# Patient Record
Sex: Male | Born: 1945 | Race: Black or African American | Hispanic: No | Smoking: Former smoker
Health system: Southern US, Community
[De-identification: ages and names within clinical notes are randomized; demographics above are authoritative.]

## PROBLEM LIST (undated history)

## (undated) DIAGNOSIS — E119 Type 2 diabetes mellitus without complications: Secondary | ICD-10-CM

## (undated) DIAGNOSIS — I10 Essential (primary) hypertension: Secondary | ICD-10-CM

## (undated) DIAGNOSIS — F329 Major depressive disorder, single episode, unspecified: Secondary | ICD-10-CM

## (undated) DIAGNOSIS — M25551 Pain in right hip: Secondary | ICD-10-CM

## (undated) DIAGNOSIS — M545 Low back pain, unspecified: Secondary | ICD-10-CM

## (undated) DIAGNOSIS — G8929 Other chronic pain: Secondary | ICD-10-CM

## (undated) DIAGNOSIS — E785 Hyperlipidemia, unspecified: Secondary | ICD-10-CM

## (undated) DIAGNOSIS — N200 Calculus of kidney: Secondary | ICD-10-CM

## (undated) DIAGNOSIS — G43909 Migraine, unspecified, not intractable, without status migrainosus: Secondary | ICD-10-CM

## (undated) DIAGNOSIS — F32A Depression, unspecified: Secondary | ICD-10-CM

## (undated) DIAGNOSIS — G8918 Other acute postprocedural pain: Secondary | ICD-10-CM

## (undated) DIAGNOSIS — F419 Anxiety disorder, unspecified: Secondary | ICD-10-CM

## (undated) HISTORY — DX: Essential (primary) hypertension: I10

## (undated) HISTORY — DX: Hyperlipidemia, unspecified: E78.5

## (undated) HISTORY — PX: TOTAL HIP ARTHROPLASTY: SHX124

## (undated) HISTORY — DX: Major depressive disorder, single episode, unspecified: F32.9

## (undated) HISTORY — DX: Type 2 diabetes mellitus without complications: E11.9

## (undated) HISTORY — DX: Pain in right hip: M25.551

## (undated) HISTORY — PX: OTHER SURGICAL HISTORY: SHX169

## (undated) HISTORY — DX: Low back pain: M54.5

## (undated) HISTORY — DX: Other chronic pain: G89.29

## (undated) HISTORY — DX: Calculus of kidney: N20.0

## (undated) HISTORY — DX: Other acute postprocedural pain: G89.18

## (undated) HISTORY — PX: TONSILLECTOMY: SUR1361

## (undated) HISTORY — DX: Depression, unspecified: F32.A

## (undated) HISTORY — DX: Anxiety disorder, unspecified: F41.9

## (undated) HISTORY — DX: Low back pain, unspecified: M54.50

## (undated) HISTORY — DX: Migraine, unspecified, not intractable, without status migrainosus: G43.909

---

## 2013-02-21 ENCOUNTER — Encounter: Payer: Self-pay | Admitting: Family

## 2013-03-05 ENCOUNTER — Encounter: Payer: Self-pay | Admitting: Family

## 2013-04-26 ENCOUNTER — Ambulatory Visit: Payer: Self-pay | Admitting: Pain Medicine

## 2013-10-13 ENCOUNTER — Ambulatory Visit: Payer: Self-pay | Admitting: Pain Medicine

## 2013-10-13 LAB — HEPATIC FUNCTION PANEL A (ARMC)
ALK PHOS: 78 U/L
ALT: 71 U/L — AB
Albumin: 3.5 g/dL (ref 3.4–5.0)
Bilirubin, Direct: 0.1 mg/dL (ref 0.00–0.20)
Bilirubin,Total: 0.2 mg/dL (ref 0.2–1.0)
SGOT(AST): 43 U/L — ABNORMAL HIGH (ref 15–37)
Total Protein: 7.3 g/dL (ref 6.4–8.2)

## 2013-10-13 LAB — BASIC METABOLIC PANEL
ANION GAP: 11 (ref 7–16)
BUN: 17 mg/dL (ref 7–18)
Calcium, Total: 9 mg/dL (ref 8.5–10.1)
Chloride: 102 mmol/L (ref 98–107)
Co2: 26 mmol/L (ref 21–32)
Creatinine: 1.07 mg/dL (ref 0.60–1.30)
EGFR (Non-African Amer.): >60
Glucose: 129 mg/dL — ABNORMAL HIGH (ref 65–99)
Osmolality: 281 (ref 275–301)
POTASSIUM: 4.1 mmol/L (ref 3.5–5.1)
Sodium: 139 mmol/L (ref 136–145)

## 2013-10-13 LAB — SEDIMENTATION RATE: Erythrocyte Sed Rate: 22 mm/hr — ABNORMAL HIGH (ref 0–20)

## 2013-10-13 LAB — MAGNESIUM: Magnesium: 1.7 mg/dL — ABNORMAL LOW

## 2013-10-23 ENCOUNTER — Ambulatory Visit: Payer: Self-pay | Admitting: Pain Medicine

## 2013-12-12 ENCOUNTER — Ambulatory Visit: Payer: Self-pay | Admitting: Pain Medicine

## 2013-12-19 ENCOUNTER — Ambulatory Visit: Payer: Self-pay | Admitting: Pain Medicine

## 2014-05-02 DIAGNOSIS — G629 Polyneuropathy, unspecified: Secondary | ICD-10-CM | POA: Insufficient documentation

## 2014-05-02 DIAGNOSIS — G8220 Paraplegia, unspecified: Secondary | ICD-10-CM | POA: Insufficient documentation

## 2014-11-28 ENCOUNTER — Encounter: Payer: Self-pay | Admitting: Pain Medicine

## 2014-12-05 ENCOUNTER — Other Ambulatory Visit: Payer: Self-pay | Admitting: Pain Medicine

## 2014-12-05 ENCOUNTER — Encounter: Payer: Self-pay | Admitting: Pain Medicine

## 2014-12-05 ENCOUNTER — Ambulatory Visit: Payer: Medicare Other | Attending: Pain Medicine | Admitting: Pain Medicine

## 2014-12-05 VITALS — BP 106/85 | HR 88 | Temp 98.3°F | Resp 18 | Ht 73.0 in | Wt 245.0 lb

## 2014-12-05 DIAGNOSIS — K219 Gastro-esophageal reflux disease without esophagitis: Secondary | ICD-10-CM | POA: Diagnosis not present

## 2014-12-05 DIAGNOSIS — M792 Neuralgia and neuritis, unspecified: Secondary | ICD-10-CM

## 2014-12-05 DIAGNOSIS — M5442 Lumbago with sciatica, left side: Secondary | ICD-10-CM

## 2014-12-05 DIAGNOSIS — G8929 Other chronic pain: Secondary | ICD-10-CM

## 2014-12-05 DIAGNOSIS — M79604 Pain in right leg: Secondary | ICD-10-CM

## 2014-12-05 DIAGNOSIS — E785 Hyperlipidemia, unspecified: Secondary | ICD-10-CM

## 2014-12-05 DIAGNOSIS — N181 Chronic kidney disease, stage 1: Secondary | ICD-10-CM

## 2014-12-05 DIAGNOSIS — E1142 Type 2 diabetes mellitus with diabetic polyneuropathy: Secondary | ICD-10-CM | POA: Diagnosis not present

## 2014-12-05 DIAGNOSIS — M47896 Other spondylosis, lumbar region: Secondary | ICD-10-CM

## 2014-12-05 DIAGNOSIS — E1040 Type 1 diabetes mellitus with diabetic neuropathy, unspecified: Secondary | ICD-10-CM | POA: Diagnosis not present

## 2014-12-05 DIAGNOSIS — N189 Chronic kidney disease, unspecified: Secondary | ICD-10-CM | POA: Insufficient documentation

## 2014-12-05 DIAGNOSIS — M545 Low back pain, unspecified: Secondary | ICD-10-CM

## 2014-12-05 DIAGNOSIS — Z96641 Presence of right artificial hip joint: Secondary | ICD-10-CM | POA: Diagnosis not present

## 2014-12-05 DIAGNOSIS — M25559 Pain in unspecified hip: Secondary | ICD-10-CM | POA: Diagnosis present

## 2014-12-05 DIAGNOSIS — M5441 Lumbago with sciatica, right side: Secondary | ICD-10-CM

## 2014-12-05 DIAGNOSIS — G4733 Obstructive sleep apnea (adult) (pediatric): Secondary | ICD-10-CM | POA: Diagnosis not present

## 2014-12-05 DIAGNOSIS — F32A Depression, unspecified: Secondary | ICD-10-CM

## 2014-12-05 DIAGNOSIS — M5416 Radiculopathy, lumbar region: Secondary | ICD-10-CM

## 2014-12-05 DIAGNOSIS — Z91041 Radiographic dye allergy status: Secondary | ICD-10-CM

## 2014-12-05 DIAGNOSIS — M79605 Pain in left leg: Secondary | ICD-10-CM

## 2014-12-05 DIAGNOSIS — Z5181 Encounter for therapeutic drug level monitoring: Secondary | ICD-10-CM | POA: Insufficient documentation

## 2014-12-05 DIAGNOSIS — M25551 Pain in right hip: Secondary | ICD-10-CM | POA: Diagnosis not present

## 2014-12-05 DIAGNOSIS — I10 Essential (primary) hypertension: Secondary | ICD-10-CM

## 2014-12-05 DIAGNOSIS — Z79899 Other long term (current) drug therapy: Secondary | ICD-10-CM

## 2014-12-05 DIAGNOSIS — I129 Hypertensive chronic kidney disease with stage 1 through stage 4 chronic kidney disease, or unspecified chronic kidney disease: Secondary | ICD-10-CM | POA: Insufficient documentation

## 2014-12-05 DIAGNOSIS — F1120 Opioid dependence, uncomplicated: Secondary | ICD-10-CM

## 2014-12-05 DIAGNOSIS — Z79891 Long term (current) use of opiate analgesic: Secondary | ICD-10-CM

## 2014-12-05 DIAGNOSIS — F329 Major depressive disorder, single episode, unspecified: Secondary | ICD-10-CM | POA: Diagnosis not present

## 2014-12-05 DIAGNOSIS — R94130 Abnormal response to nerve stimulation, unspecified: Secondary | ICD-10-CM

## 2014-12-05 DIAGNOSIS — F411 Generalized anxiety disorder: Secondary | ICD-10-CM | POA: Diagnosis not present

## 2014-12-05 DIAGNOSIS — Z794 Long term (current) use of insulin: Secondary | ICD-10-CM

## 2014-12-05 DIAGNOSIS — M542 Cervicalgia: Secondary | ICD-10-CM

## 2014-12-05 DIAGNOSIS — F39 Unspecified mood [affective] disorder: Secondary | ICD-10-CM | POA: Insufficient documentation

## 2014-12-05 DIAGNOSIS — M47816 Spondylosis without myelopathy or radiculopathy, lumbar region: Secondary | ICD-10-CM | POA: Diagnosis not present

## 2014-12-05 DIAGNOSIS — E1159 Type 2 diabetes mellitus with other circulatory complications: Secondary | ICD-10-CM | POA: Insufficient documentation

## 2014-12-05 DIAGNOSIS — F1190 Opioid use, unspecified, uncomplicated: Secondary | ICD-10-CM | POA: Insufficient documentation

## 2014-12-05 DIAGNOSIS — E1169 Type 2 diabetes mellitus with other specified complication: Secondary | ICD-10-CM | POA: Insufficient documentation

## 2014-12-05 DIAGNOSIS — E119 Type 2 diabetes mellitus without complications: Secondary | ICD-10-CM

## 2014-12-05 DIAGNOSIS — IMO0001 Reserved for inherently not codable concepts without codable children: Secondary | ICD-10-CM | POA: Insufficient documentation

## 2014-12-05 DIAGNOSIS — E782 Mixed hyperlipidemia: Secondary | ICD-10-CM | POA: Insufficient documentation

## 2014-12-05 DIAGNOSIS — M79606 Pain in leg, unspecified: Secondary | ICD-10-CM

## 2014-12-05 HISTORY — DX: Other chronic pain: G89.29

## 2014-12-05 MED ORDER — OXYCODONE HCL 10 MG PO TABS: 10.0000 mg | ORAL_TABLET | Freq: Four times a day (QID) | ORAL | Status: DC | PRN

## 2014-12-05 MED ORDER — PREGABALIN 75 MG PO CAPS: 75.0000 mg | ORAL_CAPSULE | Freq: Two times a day (BID) | ORAL | Status: DC

## 2014-12-05 NOTE — Patient Instructions (Addendum)
GENERAL RISKS AND COMPLICATIONS  What are the risk, side effects and possible complications? Generally speaking, most procedures are safe.  However, with any procedure there are risks, side effects, and the possibility of complications.  The risks and complications are dependent upon the sites that are lesioned, or the type of nerve block to be performed.  The closer the procedure is to the spine, the more serious the risks are.  Great care is taken when placing the radio frequency needles, block needles or lesioning probes, but sometimes complications can occur.  Infection: Any time there is an injection through the skin, there is a risk of infection.  This is why sterile conditions are used for these blocks.  There are four possible types of infection.  Localized skin infection.  Central Nervous System Infection-This can be in the form of Meningitis, which can be deadly.  Epidural Infections-This can be in the form of an epidural abscess, which can cause pressure inside of the spine, causing compression of the spinal cord with subsequent paralysis. This would require an emergency surgery to decompress, and there are no guarantees that the patient would recover from the paralysis.  Discitis-This is an infection of the intervertebral discs.  It occurs in about 1% of discography procedures.  It is difficult to treat and it may lead to surgery.        2. Pain: the needles have to go through skin and soft tissues, will cause soreness.       3. Damage to internal structures:  The nerves to be lesioned may be near blood vessels or    other nerves which can be potentially damaged.       4. Bleeding: Bleeding is more common if the patient is taking blood thinners such as  aspirin, Coumadin, Ticiid, Plavix, etc., or if he/she have some genetic predisposition  such as hemophilia. Bleeding into the spinal canal can cause compression of the spinal  cord with subsequent paralysis.  This would require an  emergency surgery to  decompress and there are no guarantees that the patient would recover from the  paralysis.       5. Pneumothorax:  Puncturing of a lung is a possibility, every time a needle is introduced in  the area of the chest or upper back.  Pneumothorax refers to free air around the  collapsed lung(s), inside of the thoracic cavity (chest cavity).  Another two possible  complications related to a similar event would include: Hemothorax and Chylothorax.   These are variations of the Pneumothorax, where instead of air around the collapsed  lung(s), you may have blood or chyle, respectively.       6. Spinal headaches: They may occur with any procedures in the area of the spine.       7. Persistent CSF (Cerebro-Spinal Fluid) leakage: This is a rare problem, but may occur  with prolonged intrathecal or epidural catheters either due to the formation of a fistulous  track or a dural tear.       8. Nerve damage: By working so close to the spinal cord, there is always a possibility of  nerve damage, which could be as serious as a permanent spinal cord injury with  paralysis.       9. Death:  Although rare, severe deadly allergic reactions known as "Anaphylactic  reaction" can occur to any of the medications used.      10. Worsening of the symptoms:  We can always make thing worse.  What are the chances of something like this happening? Chances of any of this occuring are extremely low.  By statistics, you have more of a chance of getting killed in a motor vehicle accident: while driving to the hospital than any of the above occurring .  Nevertheless, you should be aware that they are possibilities.  In general, it is similar to taking a shower.  Everybody knows that you can slip, hit your head and get killed.  Does that mean that you should not shower again?  Nevertheless always keep in mind that statistics do not mean anything if you happen to be on the wrong side of them.  Even if a procedure has a 1  (one) in a 1,000,000 (million) chance of going wrong, it you happen to be that one..Also, keep in mind that by statistics, you have more of a chance of having something go wrong when taking medications.  Who should not have this procedure? If you are on a blood thinning medication (e.g. Coumadin, Plavix, see list of "Blood Thinners"), or if you have an active infection going on, you should not have the procedure.  If you are taking any blood thinners, please inform your physician.  How should I prepare for this procedure?  Do not eat or drink anything at least six hours prior to the procedure.  Bring a driver with you .  It cannot be a taxi.  Come accompanied by an adult that can drive you back, and that is strong enough to help you if your legs get weak or numb from the local anesthetic.  Take all of your medicines the morning of the procedure with just enough water to swallow them.  If you have diabetes, make sure that you are scheduled to have your procedure done first thing in the morning, whenever possible.  If you have diabetes, take only half of your insulin dose and notify our nurse that you have done so as soon as you arrive at the clinic.  If you are diabetic, but only take blood sugar pills (oral hypoglycemic), then do not take them on the morning of your procedure.  You may take them after you have had the procedure.  Do not take aspirin or any aspirin-containing medications, at least eleven (11) days prior to the procedure.  They may prolong bleeding.  Wear loose fitting clothing that may be easy to take off and that you would not mind if it got stained with Betadine or blood.  Do not wear any jewelry or perfume  Remove any nail coloring.  It will interfere with some of our monitoring equipment.  NOTE: Remember that this is not meant to be interpreted as a complete list of all possible complications.  Unforeseen problems may occur.  BLOOD THINNERS The following drugs  contain aspirin or other products, which can cause increased bleeding during surgery and should not be taken for 2 weeks prior to and 1 week after surgery.  If you should need take something for relief of minor pain, you may take acetaminophen which is found in Tylenol,m Datril, Anacin-3 and Panadol. It is not blood thinner. The products listed below are.  Do not take any of the products listed below in addition to any listed on your instruction sheet.  A.P.C or A.P.C with Codeine Codeine Phosphate Capsules #3 Ibuprofen Ridaura  ABC compound Congesprin Imuran rimadil  Advil Cope Indocin Robaxisal  Alka-Seltzer Effervescent Pain Reliever and Antacid Coricidin or Coricidin-D  Indomethacin Rufen    Alka-Seltzer plus Cold Medicine Cosprin Ketoprofen S-A-C Tablets  Anacin Analgesic Tablets or Capsules Coumadin Korlgesic Salflex  Anacin Extra Strength Analgesic tablets or capsules CP-2 Tablets Lanoril Salicylate  Anaprox Cuprimine Capsules Levenox Salocol  Anexsia-D Dalteparin Magan Salsalate  Anodynos Darvon compound Magnesium Salicylate Sine-off  Ansaid Dasin Capsules Magsal Sodium Salicylate  Anturane Depen Capsules Marnal Soma  APF Arthritis pain formula Dewitt's Pills Measurin Stanback  Argesic Dia-Gesic Meclofenamic Sulfinpyrazone  Arthritis Bayer Timed Release Aspirin Diclofenac Meclomen Sulindac  Arthritis pain formula Anacin Dicumarol Medipren Supac  Analgesic (Safety coated) Arthralgen Diffunasal Mefanamic Suprofen  Arthritis Strength Bufferin Dihydrocodeine Mepro Compound Suprol  Arthropan liquid Dopirydamole Methcarbomol with Aspirin Synalgos  ASA tablets/Enseals Disalcid Micrainin Tagament  Ascriptin Doan's Midol Talwin  Ascriptin A/D Dolene Mobidin Tanderil  Ascriptin Extra Strength Dolobid Moblgesic Ticlid  Ascriptin with Codeine Doloprin or Doloprin with Codeine Momentum Tolectin  Asperbuf Duoprin Mono-gesic Trendar  Aspergum Duradyne Motrin or Motrin IB Triminicin  Aspirin  plain, buffered or enteric coated Durasal Myochrisine Trigesic  Aspirin Suppositories Easprin Nalfon Trillsate  Aspirin with Codeine Ecotrin Regular or Extra Strength Naprosyn Uracel  Atromid-S Efficin Naproxen Ursinus  Auranofin Capsules Elmiron Neocylate Vanquish  Axotal Emagrin Norgesic Verin  Azathioprine Empirin or Empirin with Codeine Normiflo Vitamin E  Azolid Emprazil Nuprin Voltaren  Bayer Aspirin plain, buffered or children's or timed BC Tablets or powders Encaprin Orgaran Warfarin Sodium  Buff-a-Comp Enoxaparin Orudis Zorpin  Buff-a-Comp with Codeine Equegesic Os-Cal-Gesic   Buffaprin Excedrin plain, buffered or Extra Strength Oxalid   Bufferin Arthritis Strength Feldene Oxphenbutazone   Bufferin plain or Extra Strength Feldene Capsules Oxycodone with Aspirin   Bufferin with Codeine Fenoprofen Fenoprofen Pabalate or Pabalate-SF   Buffets II Flogesic Panagesic   Buffinol plain or Extra Strength Florinal or Florinal with Codeine Panwarfarin   Buf-Tabs Flurbiprofen Penicillamine   Butalbital Compound Four-way cold tablets Penicillin   Butazolidin Fragmin Pepto-Bismol   Carbenicillin Geminisyn Percodan   Carna Arthritis Reliever Geopen Persantine   Carprofen Gold's salt Persistin   Chloramphenicol Goody's Phenylbutazone   Chloromycetin Haltrain Piroxlcam   Clmetidine heparin Plaquenil   Cllnoril Hyco-pap Ponstel   Clofibrate Hydroxy chloroquine Propoxyphen         Before stopping any of these medications, be sure to consult the physician who ordered them.  Some, such as Coumadin (Warfarin) are ordered to prevent or treat serious conditions such as "deep thrombosis", "pumonary embolisms", and other heart problems.  The amount of time that you may need off of the medication may also vary with the medication and the reason for which you were taking it.  If you are taking any of these medications, please make sure you notify your pain physician before you undergo any  procedures.         Facet Blocks Patient Information  Description: The facets are joints in the spine between the vertebrae.  Like any joints in the body, facets can become irritated and painful.  Arthritis can also effect the facets.  By injecting steroids and local anesthetic in and around these joints, we can temporarily block the nerve supply to them.  Steroids act directly on irritated nerves and tissues to reduce selling and inflammation which often leads to decreased pain.  Facet blocks may be done anywhere along the spine from the neck to the low back depending upon the location of your pain.   After numbing the skin with local anesthetic (like Novocaine), a small needle is passed onto the facet joints under x-ray guidance.    You may experience a sensation of pressure while this is being done.  The entire block usually lasts about 15-25 minutes.   Conditions which may be treated by facet blocks:   Low back/buttock pain  Neck/shoulder pain  Certain types of headaches  Preparation for the injection:   Do not eat any solid food or dairy products within 6 hours of your appointment.  You may drink clear liquid up to 2 hours before appointment.  Clear liquids include water, black coffee, juice or soda.  No milk or cream please.  You may take your regular medication, including pain medications, with a sip of water before your appointment.  Diabetics should hold regular insulin (if taken separately) and take 1/2 normal NPH dose the morning of the procedure.  Carry some sugar containing items with you to your appointment.  A driver must accompany you and be prepared to drive you home after your procedure.  Bring all your current medications with you.  An IV may be inserted and sedation may be given at the discretion of the physician.  A blood pressure cuff, EKG and other monitors will often be applied during the procedure.  Some patients may need to have extra oxygen administered  for a short period.  You will be asked to provide medical information, including your allergies and medications, prior to the procedure.  We must know immediately if you are taking blood thinners (like Coumadin/Warfarin) or if you are allergic to IV iodine contrast (dye).  We must know if you could possible be pregnant.  Possible side-effects:   Bleeding from needle site  Infection (rare, may require surgery)  Nerve injury (rare)  Numbness & tingling (temporary)  Difficulty urinating (rare, temporary)  Spinal headache (a headache worse with upright posture)  Light-headedness (temporary)  Pain at injection site (serveral days)  Decreased blood pressure (rare, temporary)  Weakness in arm/leg (temporary)  Pressure sensation in back/neck (temporary)   Call if you experience:   Fever/chills associated with headache or increased back/neck pain  Headache worsened by an upright position  New onset, weakness or numbness of an extremity below the injection site  Hives or difficulty breathing (go to the emergency room)  Inflammation or drainage at the injection site(s)  Severe back/neck pain greater than usual  New symptoms which are concerning to you  Please note:  Although the local anesthetic injected can often make your back or neck feel good for several hours after the injection, the pain will likely return. It takes 3-7 days for steroids to work.  You may not notice any pain relief for at least one week.  If effective, we will often do a series of 2-3 injections spaced 3-6 weeks apart to maximally decrease your pain.  After the initial series, you may be a candidate for a more permanent nerve block of the facets.  If you have any questions, please call #[phone]) [facility] Clinic[facility] Radiofrequency lesioning is a procedure that is performed to relieve pain. The procedure is often used for back, neck, or arm pain.  Radiofrequency lesioning involves the use of a machine that creates radio waves to make heat. During the procedure, the heat is applied to the nerve that carries the pain signal. The heat damages the nerve and interferes with the pain signal. Pain relief usually lasts for 6 months to 1 year. LET [facility] CARE PROVIDER KNOW ABOUT:  Any allergies you have.  All medicines you are taking, including  vitamins, herbs, eye drops, creams, and over-the-counter medicines.  Previous problems you or members of your family have had with the use of anesthetics.  Any blood disorders you have.  Previous surgeries you have had.  Any medical conditions you have.  Whether you are pregnant or may be pregnant. RISKS AND COMPLICATIONS Generally, this is a safe procedure. However, problems may occur, including:  Pain or soreness at the injection site.  Infection at the injection site.  Damage to nerves or blood vessels. BEFORE THE PROCEDURE  Ask your health care provider about:  Changing or stopping your regular medicines. This is especially important if you are taking diabetes medicines or blood thinners.  Taking medicines such as aspirin and ibuprofen. These medicines can thin your blood. Do not take these medicines before your procedure if your health care provider instructs you not to.  Follow instructions from your health care provider about eating or drinking restrictions.  Plan to have someone take you home after the procedure.  If you go home right after the procedure, plan to have someone with you for 24 hours. PROCEDURE  You will be given one or more of the following:  A medicine to help you relax (sedative).  A medicine to numb the area (local anesthetic).  You will be awake during the procedure. You will need to be able to talk with the health care provider during the procedure.  With the help of a type of X-ray (fluoroscopy), the health care provider will insert a  radiofrequency needle into the area to be treated.  Next, a wire that carries the radio waves (electrode) will be put through the radiofrequency needle. An electrical pulse will be sent through the electrode to verify the correct nerve. You will feel a tingling sensation, and you may have muscle twitching.  Then, the tissue that is around the needle tip will be heated by an electric current that is passed using the radiofrequency machine. This will numb the nerves.  A bandage (dressing) will be put on the insertion area after the procedure is done. The procedure may vary among health care providers and hospitals. AFTER THE PROCEDURE  Your blood pressure, heart rate, breathing rate, and blood oxygen level will be monitored often until the medicines you were given have worn off.  Return to your normal activities as directed by your health care provider.   This information is not intended to replace advice given to you by your health care provider. Make sure you discuss any questions you have with your health care provider.   Document Released: 09/17/2010 Document Revised: 10/10/2014 Document Reviewed: 02/26/2014 Elsevier Interactive Patient Education [employer].

## 2014-12-05 NOTE — Progress Notes (Signed)
Safety precautions to be maintained throughout the outpatient stay will include: orient to surroundings, keep bed in low position, maintain call bell within reach at all times, provide assistance with transfer out of bed and ambulation.  

## 2014-12-05 NOTE — Progress Notes (Signed)
Patient's Name: MARTE CELANI MRN: [id number] DOB: November 05, 1945 DOS: 12/05/2014  Primary Reason(s) for Visit: Encounter for Medication Management. CC: Neck Pain and Hip Pain   HPI:   Mr. Beske is a 69 y.o. year old, male patient, who returns today as an established patient. 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 His primarily concern today is the Neck Pain and Hip Pain    The patient is currently doing well on his medication regimen. I do not plan to change any of the medications at this point. In reviewing his chart, on 12/12/2013 the patient underwent a diagnostic bilateral lumbar facet block under fluoroscopic guidance without sedation. The results indicated 100% relief of the pain for the duration of local anesthetic followed by a 75% improvement in his low back pain and leg pain which has lasted until recently. Based on these results, I believe that he would do well with a lumbar facet radiofrequency ablation. He  wants to proceed with that. Today we reviewed what he has done for his low back pain and he indicates that he completed physical therapy for his lower back and legs, which actually made things worse. This is in fact compatible with lumbar facet syndrome which typically worsens instead of improving with physical therapy. Based on that and the results of the diagnostic lumbar facet block, I will go ahead and schedule him for the lumbar facet radiofrequency. We will do one side at a time. He has elected to do the right side first. We will schedule this as soon as possible. Today's Pain Location: Hip Pain Descriptors / Indicators: Aching, Constant, Radiating, Sore Pain Frequency: Constant  Date of Last Visit: Date of Last Visit: 06/14/14 Service Provided on Last Visit: Service Provided on Last Visit: Med Refill  Pharmacotherapy Review:   Side-effects or Adverse reactions: None reported. Effectiveness: Described as relatively effective, allowing for increase in activities of daily living (ADL). Onset of action: Within expected pharmacological parameters. Duration of action: Within normal limits for medication. Peak effect: Timing and results are as within normal expected parameters. [place] PMP: Compliant with practice rules and regulations. DST: Compliant with practice rules and regulations. Lab work: No new labs ordered by our practice. Treatment compliance: Compliant. Substance Use Disorder (SUD) Risk Level: Low Planned course of action: Continue therapy as is.  Allergies: Mr. Paolo is allergic to contrast media; iodine; and shellfish allergy.  Meds: The patient has a current medication list which includes the following prescription(s): carvedilol, hydrochlorothiazide, insulin aspart protamine- aspart, insulin detemir, liraglutide, metformin, tamsulosin, oxycodone hcl, oxycodone hcl, oxycodone hcl, and pregabalin. Requested Prescriptions   Signed Prescriptions Disp Refills  . Oxycodone HCl  10 MG TABS 120 tablet 0    Sig: Take 1 tablet (10 mg  total) by mouth every 6 (six) hours as needed.  . Oxycodone HCl 10 MG TABS 120 tablet 0    Sig: Take 1 tablet (10 mg total) by mouth every 6 (six) hours as needed.  . Oxycodone HCl 10 MG TABS 120 tablet 0    Sig: Take 1 tablet (10 mg total) by mouth every 6 (six) hours as needed.  . pregabalin (LYRICA) 75 MG capsule 60 capsule 2    Sig: Take 1 capsule (75 mg total) by mouth every 12 (twelve) hours.    ROS: Constitutional: Afebrile, no chills, well hydrated and well nourished Gastrointestinal: negative Musculoskeletal:negative Neurological: negative Behavioral/Psych: negative  PFSH: Medical:  Mr. Mobley  has a past medical history of Anxiety; Kidney stones; Chronic lumbar pain; Hyperlipidemia; Hypertension; Depression; and Migraines. Family: family history includes Cancer in his mother; Heart disease in his father; Stroke in his father. Surgical:  has past surgical history that includes Total hip arthroplasty; Tonsillectomy; and right hip surgery. Tobacco:  reports that he has never smoked. He does not have any smokeless tobacco history on file. Alcohol:  reports that he does not drink alcohol. Drug:  has no drug history on file.  Physical Exam: Vitals:  Today's Vitals   12/05/14 0959  BP: 106/85  Pulse: 88  Temp: 98.3 F (36.8 C)  TempSrc: Oral  Resp: 18  Height: 6\' 1"  (1.854 m)  Weight: 245 lb (111.131 kg)  SpO2: 100%  Calculated BMI: Body mass index is 32.33 kg/(m^2). General appearance: alert, cooperative, appears stated age, distracted, no distress and moderately obese Eyes: conjunctivae/corneas clear. PERRL, EOM's intact. Fundi benign. Lungs: No evidence respiratory distress, no audible rales or ronchi and no use of accessory muscles of respiration Neck: no adenopathy, no carotid bruit, no JVD, supple, symmetrical, trachea midline and thyroid not enlarged, symmetric, no tenderness/mass/nodules Back: symmetric, no  curvature. ROM normal. No CVA tenderness. Extremities: extremities normal, atraumatic, no cyanosis or edema Pulses: 2+ and symmetric Skin: Skin color, texture, turgor normal. No rashes or lesions Neurologic: Gait: Antalgic. The patient ambulates using a cane.    Assessment: Encounter Diagnosis:  Primary Diagnosis: Chronic pain [G89.29]  Plan: Jamerion was seen today for neck pain and hip pain.  Diagnoses and all orders for this visit:  Chronic pain -     Oxycodone HCl 10 MG TABS; Take 1 tablet (10 mg total) by mouth every 6 (six) hours as needed. -     Oxycodone HCl 10 MG TABS; Take 1 tablet (10 mg total) by mouth every 6 (six) hours as needed. -     Oxycodone HCl 10 MG TABS; Take 1 tablet (10 mg total) by mouth every 6 (six) hours as needed. -     pregabalin (LYRICA) 75 MG capsule; Take 1 capsule (75 mg total) by mouth every 12 (twelve) hours.  Chronic low back pain  Lumbar facet syndrome (Bilateral) (R>L) -     RADIOFREQUENCY, CERVICAL THORACIC LUMBER, GENICULAR ; Future  Other osteoarthritis of spine, lumbar region  Diabetic peripheral neuropathy ([place])  Long term current use of opiate analgesic -     Drugs of abuse screen w/o alc, rtn urine-sln; Future  Long term prescription opiate use  Opiate use  Uncomplicated opioid dependence ([place])  Encounter for therapeutic drug level monitoring  Chronic neck pain (L>R)  Chronic pain of right hip  Neurogenic pain  Neuropathic pain  Insulin dependent diabetes mellitus (HCC)  Contrast dye allergy  Chronic pain of lower extremity, unspecified laterality  Chronic lumbar  radicular pain (Bilateral) (R>L) (Right L5 dermatome)  History of right hip replacement  Chronic right hip pain  Morbid obesity due to excess calories (HCC)  Essential hypertension  Gastroesophageal reflux disease without esophagitis  Obstructive sleep apnea  Hyperlipidemia  Chronic kidney disease (CKD), stage 1  Generalized anxiety  disorder  Depression  Abnormal nerve conduction studies (severe bilateral lower extremity polyneuropathy)     Patient Instructions   GENERAL RISKS AND COMPLICATIONS  What are the risk, side effects and possible complications? Generally speaking, most procedures are safe.  However, with any procedure there are risks, side effects, and the possibility of complications.  The risks and complications are dependent upon the sites that are lesioned, or the type of nerve block to be performed.  The closer the procedure is to the spine, the more serious the risks are.  Great care is taken when placing the radio frequency needles, block needles or lesioning probes, but sometimes complications can occur.  Infection: Any time there is an injection through the skin, there is a risk of infection.  This is why sterile conditions are used for these blocks.  There are four possible types of infection.  Localized skin infection.  Central Nervous System Infection-This can be in the form of Meningitis, which can be deadly.  Epidural Infections-This can be in the form of an epidural abscess, which can cause pressure inside of the spine, causing compression of the spinal cord with subsequent paralysis. This would require an emergency surgery to decompress, and there are no guarantees that the patient would recover from the paralysis.  Discitis-This is an infection of the intervertebral discs.  It occurs in about 1% of discography procedures.  It is difficult to treat and it may lead to surgery.        2. Pain: the needles have to go through skin and soft tissues, will cause soreness.       3. Damage to internal structures:  The nerves to be lesioned may be near blood vessels or    other nerves which can be potentially damaged.       4. Bleeding: Bleeding is more common if the patient is taking blood thinners such as  aspirin, Coumadin, Ticiid, Plavix, etc., or if he/she have some genetic predisposition  such  as hemophilia. Bleeding into the spinal canal can cause compression of the spinal  cord with subsequent paralysis.  This would require an emergency surgery to  decompress and there are no guarantees that the patient would recover from the  paralysis.       5. Pneumothorax:  Puncturing of a lung is a possibility, every time a needle is introduced in  the area of the chest or upper back.  Pneumothorax refers to free air around the  collapsed lung(s), inside of the thoracic cavity (chest cavity).  Another two possible  complications related to a similar event would include: Hemothorax and Chylothorax.   These are variations of the Pneumothorax, where instead of air around the collapsed  lung(s), you may have blood or chyle, respectively.       6. Spinal headaches: They may occur with any procedures in the area of the spine.       7. Persistent CSF (Cerebro-Spinal Fluid) leakage: This is a rare problem, but may occur  with prolonged intrathecal or epidural catheters either due to the formation of a fistulous  track or a dural tear.       8. Nerve damage: By working so  close to the spinal cord, there is always a possibility of  nerve damage, which could be as serious as a permanent spinal cord injury with  paralysis.       9. Death:  Although rare, severe deadly allergic reactions known as "Anaphylactic  reaction" can occur to any of the medications used.      10. Worsening of the symptoms:  We can always make thing worse.  What are the chances of something like this happening? Chances of any of this occuring are extremely low.  By statistics, you have more of a chance of getting killed in a motor vehicle accident: while driving to the hospital than any of the above occurring .  Nevertheless, you should be aware that they are possibilities.  In general, it is similar to taking a shower.  Everybody knows that you can slip, hit your head and get killed.  Does that mean that you should not shower again?   Nevertheless always keep in mind that statistics do not mean anything if you happen to be on the wrong side of them.  Even if a procedure has a 1 (one) in a 1,000,000 (million) chance of going wrong, it you happen to be that one..Also, keep in mind that by statistics, you have more of a chance of having something go wrong when taking medications.  Who should not have this procedure? If you are on a blood thinning medication (e.g. Coumadin, Plavix, see list of "Blood Thinners"), or if you have an active infection going on, you should not have the procedure.  If you are taking any blood thinners, please inform your physician.  How should I prepare for this procedure?  Do not eat or drink anything at least six hours prior to the procedure.  Bring a driver with you .  It cannot be a taxi.  Come accompanied by an adult that can drive you back, and that is strong enough to help you if your legs get weak or numb from the local anesthetic.  Take all of your medicines the morning of the procedure with just enough water to swallow them.  If you have diabetes, make sure that you are scheduled to have your procedure done first thing in the morning, whenever possible.  If you have diabetes, take only half of your insulin dose and notify our nurse that you have done so as soon as you arrive at the clinic.  If you are diabetic, but only take blood sugar pills (oral hypoglycemic), then do not take them on the morning of your procedure.  You may take them after you have had the procedure.  Do not take aspirin or any aspirin-containing medications, at least eleven (11) days prior to the procedure.  They may prolong bleeding.  Wear loose fitting clothing that may be easy to take off and that you would not mind if it got stained with Betadine or blood.  Do not wear any jewelry or perfume  Remove any nail coloring.  It will interfere with some of our monitoring equipment.  NOTE: Remember that this is not  meant to be interpreted as a complete list of all possible complications.  Unforeseen problems may occur.  BLOOD THINNERS The following drugs contain aspirin or other products, which can cause increased bleeding during surgery and should not be taken for 2 weeks prior to and 1 week after surgery.  If you should need take something for relief of minor pain, you may take acetaminophen which  is found in Tylenol,m Datril, Anacin-3 and Panadol. It is not blood thinner. The products listed below are.  Do not take any of the products listed below in addition to any listed on your instruction sheet.  A.P.C or A.P.C with Codeine Codeine Phosphate Capsules #3 Ibuprofen Ridaura  ABC compound Congesprin Imuran rimadil  Advil Cope Indocin Robaxisal  Alka-Seltzer Effervescent Pain Reliever and Antacid Coricidin or Coricidin-D  Indomethacin Rufen  Alka-Seltzer plus Cold Medicine Cosprin Ketoprofen S-A-C Tablets  Anacin Analgesic Tablets or Capsules Coumadin Korlgesic Salflex  Anacin Extra Strength Analgesic tablets or capsules CP-2 Tablets Lanoril Salicylate  Anaprox Cuprimine Capsules Levenox Salocol  Anexsia-D Dalteparin Magan Salsalate  Anodynos Darvon compound Magnesium Salicylate Sine-off  Ansaid Dasin Capsules Magsal Sodium Salicylate  Anturane Depen Capsules Marnal Soma  APF Arthritis pain formula Dewitt's Pills Measurin Stanback  Argesic Dia-Gesic Meclofenamic Sulfinpyrazone  Arthritis Bayer Timed Release Aspirin Diclofenac Meclomen Sulindac  Arthritis pain formula Anacin Dicumarol Medipren Supac  Analgesic (Safety coated) Arthralgen Diffunasal Mefanamic Suprofen  Arthritis Strength Bufferin Dihydrocodeine Mepro Compound Suprol  Arthropan liquid Dopirydamole Methcarbomol with Aspirin Synalgos  ASA tablets/Enseals Disalcid Micrainin Tagament  Ascriptin Doan's Midol Talwin  Ascriptin A/D Dolene Mobidin Tanderil  Ascriptin Extra Strength Dolobid Moblgesic Ticlid  Ascriptin with Codeine Doloprin or  Doloprin with Codeine Momentum Tolectin  Asperbuf Duoprin Mono-gesic Trendar  Aspergum Duradyne Motrin or Motrin IB Triminicin  Aspirin plain, buffered or enteric coated Durasal Myochrisine Trigesic  Aspirin Suppositories Easprin Nalfon Trillsate  Aspirin with Codeine Ecotrin Regular or Extra Strength Naprosyn Uracel  Atromid-S Efficin Naproxen Ursinus  Auranofin Capsules Elmiron Neocylate Vanquish  Axotal Emagrin Norgesic Verin  Azathioprine Empirin or Empirin with Codeine Normiflo Vitamin E  Azolid Emprazil Nuprin Voltaren  Bayer Aspirin plain, buffered or children's or timed BC Tablets or powders Encaprin Orgaran Warfarin Sodium  Buff-a-Comp Enoxaparin Orudis Zorpin  Buff-a-Comp with Codeine Equegesic Os-Cal-Gesic   Buffaprin Excedrin plain, buffered or Extra Strength Oxalid   Bufferin Arthritis Strength Feldene Oxphenbutazone   Bufferin plain or Extra Strength Feldene Capsules Oxycodone with Aspirin   Bufferin with Codeine Fenoprofen Fenoprofen Pabalate or Pabalate-SF   Buffets II Flogesic Panagesic   Buffinol plain or Extra Strength Florinal or Florinal with Codeine Panwarfarin   Buf-Tabs Flurbiprofen Penicillamine   Butalbital Compound Four-way cold tablets Penicillin   Butazolidin Fragmin Pepto-Bismol   Carbenicillin Geminisyn Percodan   Carna Arthritis Reliever Geopen Persantine   Carprofen Gold's salt Persistin   Chloramphenicol Goody's Phenylbutazone   Chloromycetin Haltrain Piroxlcam   Clmetidine heparin Plaquenil   Cllnoril Hyco-pap Ponstel   Clofibrate Hydroxy chloroquine Propoxyphen         Before stopping any of these medications, be sure to consult the physician who ordered them.  Some, such as Coumadin (Warfarin) are ordered to prevent or treat serious conditions such as "deep thrombosis", "pumonary embolisms", and other heart problems.  The amount of time that you may need off of the medication may also vary with the medication and the reason for which you were  taking it.  If you are taking any of these medications, please make sure you notify your pain physician before you undergo any procedures.         Facet Blocks Patient Information  Description: The facets are joints in the spine between the vertebrae.  Like any joints in the body, facets can become irritated and painful.  Arthritis can also effect the facets.  By injecting steroids and local anesthetic in and around these joints, we can  temporarily block the nerve supply to them.  Steroids act directly on irritated nerves and tissues to reduce selling and inflammation which often leads to decreased pain.  Facet blocks may be done anywhere along the spine from the neck to the low back depending upon the location of your pain.   After numbing the skin with local anesthetic (like Novocaine), a small needle is passed onto the facet joints under x-ray guidance.  You may experience a sensation of pressure while this is being done.  The entire block usually lasts about 15-25 minutes.   Conditions which may be treated by facet blocks:   Low back/buttock pain  Neck/shoulder pain  Certain types of headaches  Preparation for the injection:   Do not eat any solid food or dairy products within 6 hours of your appointment.  You may drink clear liquid up to 2 hours before appointment.  Clear liquids include water, black coffee, juice or soda.  No milk or cream please.  You may take your regular medication, including pain medications, with a sip of water before your appointment.  Diabetics should hold regular insulin (if taken separately) and take 1/2 normal NPH dose the morning of the procedure.  Carry some sugar containing items with you to your appointment.  A driver must accompany you and be prepared to drive you home after your procedure.  Bring all your current medications with you.  An IV may be inserted and sedation may be given at the discretion of the physician.  A blood pressure  cuff, EKG and other monitors will often be applied during the procedure.  Some patients may need to have extra oxygen administered for a short period.  You will be asked to provide medical information, including your allergies and medications, prior to the procedure.  We must know immediately if you are taking blood thinners (like Coumadin/Warfarin) or if you are allergic to IV iodine contrast (dye).  We must know if you could possible be pregnant.  Possible side-effects:   Bleeding from needle site  Infection (rare, may require surgery)  Nerve injury (rare)  Numbness & tingling (temporary)  Difficulty urinating (rare, temporary)  Spinal headache (a headache worse with upright posture)  Light-headedness (temporary)  Pain at injection site (serveral days)  Decreased blood pressure (rare, temporary)  Weakness in arm/leg (temporary)  Pressure sensation in back/neck (temporary)   Call if you experience:   Fever/chills associated with headache or increased back/neck pain  Headache worsened by an upright position  New onset, weakness or numbness of an extremity below the injection site  Hives or difficulty breathing (go to the emergency room)  Inflammation or drainage at the injection site(s)  Severe back/neck pain greater than usual  New symptoms which are concerning to you  Please note:  Although the local anesthetic injected can often make your back or neck feel good for several hours after the injection, the pain will likely return. It takes 3-7 days for steroids to work.  You may not notice any pain relief for at least one week.  If effective, we will often do a series of 2-3 injections spaced 3-6 weeks apart to maximally decrease your pain.  After the initial series, you may be a candidate for a more permanent nerve block of the facets.  If you have any questions, please call #[phone]) [facility] ClinicRadio[facility]  Lesioning Radiofrequency lesioning is a procedure that is performed to relieve pain. The procedure is often used for  back, neck, or arm pain. Radiofrequency lesioning involves the use of a machine that creates radio waves to make heat. During the procedure, the heat is applied to the nerve that carries the pain signal. The heat damages the nerve and interferes with the pain signal. Pain relief usually lasts for 6 months to 1 year. LET [facility] CARE PROVIDER KNOW ABOUT:  Any allergies you have.  All medicines you are taking, including vitamins, herbs, eye drops, creams, and over-the-counter medicines.  Previous problems you or members of your family have had with the use of anesthetics.  Any blood disorders you have.  Previous surgeries you have had.  Any medical conditions you have.  Whether you are pregnant or may be pregnant. RISKS AND COMPLICATIONS Generally, this is a safe procedure. However, problems may occur, including:  Pain or soreness at the injection site.  Infection at the injection site.  Damage to nerves or blood vessels. BEFORE THE PROCEDURE  Ask your health care provider about:  Changing or stopping your regular medicines. This is especially important if you are taking diabetes medicines or blood thinners.  Taking medicines such as aspirin and ibuprofen. These medicines can thin your blood. Do not take these medicines before your procedure if your health care provider instructs you not to.  Follow instructions from your health care provider about eating or drinking restrictions.  Plan to have someone take you home after the procedure.  If you go home right after the procedure, plan to have someone with you for 24 hours. PROCEDURE  You will be given one or more of the following:  A medicine to help you relax (sedative).  A medicine to numb the area (local anesthetic).  You will be awake during the procedure. You will need to be able to talk with the  health care provider during the procedure.  With the help of a type of X-ray (fluoroscopy), the health care provider will insert a radiofrequency needle into the area to be treated.  Next, a wire that carries the radio waves (electrode) will be put through the radiofrequency needle. An electrical pulse will be sent through the electrode to verify the correct nerve. You will feel a tingling sensation, and you may have muscle twitching.  Then, the tissue that is around the needle tip will be heated by an electric current that is passed using the radiofrequency machine. This will numb the nerves.  A bandage (dressing) will be put on the insertion area after the procedure is done. The procedure may vary among health care providers and hospitals. AFTER THE PROCEDURE  Your blood pressure, heart rate, breathing rate, and blood oxygen level will be monitored often until the medicines you were given have worn off.  Return to your normal activities as directed by your health care provider.   This information is not intended to replace advice given to you by your health care provider. Make sure you discuss any questions you have with your health care provider.   Document Released: 09/17/2010 Document Revised: 10/10/2014 Document Reviewed: 02/26/2014 Elsevier Interactive Patient Education 2016 [employer].   Medications discontinued today:  There are no discontinued medications. Medications administered today:  Mr. Ballinas does not currently have medications on file.  Primary Care Physician: Cletis Athens, MD Location: [facility] Note by: Kathlen Brunswick. Dossie Arbour, M.D, DABA, DABAPM, DABPM, DABIPP, FIPP

## 2014-12-13 LAB — TOXASSURE SELECT 13 (MW), URINE: PDF: 0

## 2015-01-08 ENCOUNTER — Other Ambulatory Visit: Payer: Self-pay | Admitting: Pain Medicine

## 2015-01-17 ENCOUNTER — Ambulatory Visit: Payer: PRIVATE HEALTH INSURANCE | Admitting: Pain Medicine

## 2015-03-06 ENCOUNTER — Encounter: Payer: Self-pay | Admitting: Pain Medicine

## 2015-03-06 ENCOUNTER — Ambulatory Visit: Payer: Medicare Other | Attending: Pain Medicine | Admitting: Pain Medicine

## 2015-03-06 ENCOUNTER — Other Ambulatory Visit: Payer: Self-pay | Admitting: Pain Medicine

## 2015-03-06 VITALS — BP 160/79 | HR 74 | Temp 97.6°F | Resp 16 | Ht 73.0 in | Wt 250.0 lb

## 2015-03-06 DIAGNOSIS — R51 Headache: Secondary | ICD-10-CM

## 2015-03-06 DIAGNOSIS — Z5181 Encounter for therapeutic drug level monitoring: Secondary | ICD-10-CM

## 2015-03-06 DIAGNOSIS — E785 Hyperlipidemia, unspecified: Secondary | ICD-10-CM | POA: Insufficient documentation

## 2015-03-06 DIAGNOSIS — M25511 Pain in right shoulder: Secondary | ICD-10-CM

## 2015-03-06 DIAGNOSIS — Z79891 Long term (current) use of opiate analgesic: Secondary | ICD-10-CM

## 2015-03-06 DIAGNOSIS — I10 Essential (primary) hypertension: Secondary | ICD-10-CM | POA: Insufficient documentation

## 2015-03-06 DIAGNOSIS — M542 Cervicalgia: Secondary | ICD-10-CM | POA: Insufficient documentation

## 2015-03-06 DIAGNOSIS — E109 Type 1 diabetes mellitus without complications: Secondary | ICD-10-CM | POA: Diagnosis not present

## 2015-03-06 DIAGNOSIS — Z96641 Presence of right artificial hip joint: Secondary | ICD-10-CM | POA: Diagnosis not present

## 2015-03-06 DIAGNOSIS — M545 Low back pain: Secondary | ICD-10-CM | POA: Insufficient documentation

## 2015-03-06 DIAGNOSIS — F1190 Opioid use, unspecified, uncomplicated: Secondary | ICD-10-CM | POA: Insufficient documentation

## 2015-03-06 DIAGNOSIS — G629 Polyneuropathy, unspecified: Secondary | ICD-10-CM | POA: Insufficient documentation

## 2015-03-06 DIAGNOSIS — M25512 Pain in left shoulder: Secondary | ICD-10-CM | POA: Insufficient documentation

## 2015-03-06 DIAGNOSIS — M47816 Spondylosis without myelopathy or radiculopathy, lumbar region: Secondary | ICD-10-CM

## 2015-03-06 DIAGNOSIS — G4486 Cervicogenic headache: Secondary | ICD-10-CM

## 2015-03-06 DIAGNOSIS — G8929 Other chronic pain: Secondary | ICD-10-CM

## 2015-03-06 DIAGNOSIS — M47896 Other spondylosis, lumbar region: Secondary | ICD-10-CM | POA: Diagnosis not present

## 2015-03-06 DIAGNOSIS — Z79899 Other long term (current) drug therapy: Secondary | ICD-10-CM | POA: Diagnosis not present

## 2015-03-06 DIAGNOSIS — M79606 Pain in leg, unspecified: Secondary | ICD-10-CM | POA: Diagnosis not present

## 2015-03-06 DIAGNOSIS — M5481 Occipital neuralgia: Secondary | ICD-10-CM

## 2015-03-06 DIAGNOSIS — M792 Neuralgia and neuritis, unspecified: Secondary | ICD-10-CM

## 2015-03-06 DIAGNOSIS — G4733 Obstructive sleep apnea (adult) (pediatric): Secondary | ICD-10-CM | POA: Insufficient documentation

## 2015-03-06 DIAGNOSIS — K219 Gastro-esophageal reflux disease without esophagitis: Secondary | ICD-10-CM | POA: Diagnosis not present

## 2015-03-06 DIAGNOSIS — F419 Anxiety disorder, unspecified: Secondary | ICD-10-CM | POA: Insufficient documentation

## 2015-03-06 DIAGNOSIS — M549 Dorsalgia, unspecified: Secondary | ICD-10-CM | POA: Diagnosis present

## 2015-03-06 DIAGNOSIS — M4726 Other spondylosis with radiculopathy, lumbar region: Secondary | ICD-10-CM

## 2015-03-06 LAB — COMPREHENSIVE METABOLIC PANEL
ALBUMIN: 3.8 g/dL (ref 3.5–5.0)
ALK PHOS: 80 U/L (ref 38–126)
ALT: 64 U/L — ABNORMAL HIGH (ref 17–63)
AST: 57 U/L — ABNORMAL HIGH (ref 15–41)
Anion gap: 7 (ref 5–15)
BUN: 14 mg/dL (ref 6–20)
CHLORIDE: 99 mmol/L — AB (ref 101–111)
CO2: 31 mmol/L (ref 22–32)
CREATININE: 0.91 mg/dL (ref 0.61–1.24)
Calcium: 9.4 mg/dL (ref 8.9–10.3)
GFR calc Af Amer: >60 mL/min (ref >60)
GLUCOSE: 196 mg/dL — AB (ref 65–99)
POTASSIUM: 4 mmol/L (ref 3.5–5.1)
SODIUM: 137 mmol/L (ref 135–145)
Total Bilirubin: 0.3 mg/dL (ref 0.3–1.2)
Total Protein: 7.5 g/dL (ref 6.5–8.1)

## 2015-03-06 LAB — MAGNESIUM: Magnesium: 1.8 mg/dL (ref 1.7–2.4)

## 2015-03-06 LAB — C-REACTIVE PROTEIN: CRP: 0.5 mg/dL (ref <1.0)

## 2015-03-06 LAB — SEDIMENTATION RATE: SED RATE: 33 mm/h — AB (ref 0–20)

## 2015-03-06 MED ORDER — PREGABALIN 75 MG PO CAPS: 75.0000 mg | ORAL_CAPSULE | Freq: Two times a day (BID) | ORAL | Status: DC

## 2015-03-06 MED ORDER — OXYCODONE HCL 10 MG PO TABS: 10.0000 mg | ORAL_TABLET | Freq: Four times a day (QID) | ORAL | Status: DC | PRN

## 2015-03-06 NOTE — Assessment & Plan Note (Signed)
This pain is likely to be secondary to lumbar facet syndrome. The patient has had more than 2 diagnostic lumbar facet blocks under fluoroscopic and IV sedation with more than 50% relief of the pain for the duration of the local anesthetic. In addition, the diagnostic blocks have shown that his leg pain is also secondary to this facet DJD. At this time, we are pending to 2 radiofrequency ablation of the lumbar facets, starting with the right side.

## 2015-03-06 NOTE — Progress Notes (Signed)
Safety precautions to be maintained throughout the outpatient stay will include: orient to surroundings, keep bed in low position, maintain call bell within reach at all times, provide assistance with transfer out of bed and ambulation. Did not bring pills for count.  Reminded to bring to each appointment.

## 2015-03-06 NOTE — Progress Notes (Signed)
Patient's Name: Alan Schmidt MRN: [id number] DOB: 20-May-1945 DOS: 03/06/2015  Primary Reason(s) for Visit: Encounter for Medication Management CC: Back Pain and Neck Pain   HPI  Alan Schmidt is a 70 y.o. year old, male patient, who returns today as an established patient. 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[place]); Long term current use of opiate analgesic; Long term prescription opiate use; Opiate use; Opiate dependence ([place]); Encounter for therapeutic drug level monitoring; Chronic neck pain (midline over the C7 spinous processes) (L>R); Neurogenic pain; Neuropathic pain; Insulin dependent diabetes mellitus ([place]); Contrast dye allergy; Chronic lower extremity pain (Location of Secondary source of pain) (Bilateral) (R>L); 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) (Right L5 dermatome); History of total hip replacement (Right); Chronic hip pain (Right); Class I Morbid obesity (HCC) (68% higher incidence of chronic low back pain); Essential hypertension; GERD (gastroesophageal reflux disease); Obstructive sleep apnea; Hyperlipidemia; Chronic kidney disease (CKD); Generalized anxiety disorder; Depression; Abnormal nerve conduction studies (severe bilateral lower extremity polyneuropathy); Chronic shoulder pain (Bilateral) (R>L); Occipital neuralgia (Left); and Cervicogenic headache (Left) on his problem list.. His primarily concern today is the Back Pain and Neck Pain   Today the patient comes in today clinics for pharmacological management of his chronic pain. His last appointment was on 12/05/2014. The patient's primary pain is that of the lower back where he indicates that the pain is bilateral but with the right being worst on the left. The secondary pain is that of the lower extremity. He indicates that  he has pain in both legs with the right being worst on the left and the pain is primarily in the hamstrings. The pain goes down to the level of the knee through the back of the legs. His third worst pain is that of the shoulders and the upper back with pain being bilateral but worse on the right when compared to the left. His fourth worst pain is that of the neck where he hurts in the posterior aspect of the neck over the C7 spinous process. The fifth one is his left sided headaches over the occipital region.  Reported Pain Score: 8 , clinically he looks like a 2-3/10. Reported level is inconsistent with clinical obrservations. Pain Type: Chronic pain Pain Location: Back (shoulder, hamstrings, neck) Pain Orientation: Lower Pain Descriptors / Indicators: Aching, Sharp Pain Frequency: Constant  Date of Last Visit: 12/05/14 Service Provided on Last Visit: Med Refill  Pharmacotherapy  Medication(s): The patient's current opioid medication consist of oxycodone IR 10 mg 1 tablet by mouth every 6 hours when necessary for the pain. The following evaluation is for the oxycodone. Onset of action: Within expected pharmacological parameters. (25 minutes) Time to Peak effect: Timing and results are as within normal expected parameters. (One hour) Analgesic Effect: At least 50% (50%) Activity Facilitation: Medication(s) allow patient to sit, stand, walk, and do the basic ADLs Perceived Effectiveness: Described as relatively effective, allowing for increase in activities of daily living (ADL) Side-effects or Adverse reactions: None reported Duration of action: Within normal limits for medication. (3-4 hours) [place] PMP: Compliant with practice rules and regulations UDS Results: Last UDS done on 11-16 came back within normal limits with no unexpected results. UDS Interpretation: Patient appears to be compliant with practice rules and regulations Medication Assessment Form: Reviewed. Patient indicates being  compliant with therapy Treatment compliance: Compliant Substance Use Disorder (  SUD) Risk Level: Low Pharmacologic Plan: Continue therapy as is  Lab Work: Illicit Drugs No results found for: THCU, COCAINSCRNUR, PCPSCRNUR, MDMA, AMPHETMU, METHADONE, ETOH  Inflammation Markers Lab Results  Component Value Date   ESRSEDRATE 33* 03/06/2015   CRP 0.5 03/06/2015    Renal Function Lab Results  Component Value Date   BUN 14 03/06/2015   CREATININE 0.91 03/06/2015   GFRAA >60 03/06/2015   GFRNONAA >60 03/06/2015    Hepatic Function Lab Results  Component Value Date   AST 57* 03/06/2015   ALT 64* 03/06/2015   ALBUMIN 3.8 03/06/2015    Electrolytes Lab Results  Component Value Date   NA 137 03/06/2015   K 4.0 03/06/2015   CL 99* 03/06/2015   CALCIUM 9.4 03/06/2015   MG 1.8 03/06/2015    Allergies  Alan Schmidt is allergic to contrast media; iodine; and shellfish allergy.  Meds  The patient has a current medication list which includes the following prescription(s): accu-chek compact plus, alprazolam, bd pen needle nano u/f, carvedilol, vitamin d3, desoximetasone, fluoxetine, hydrochlorothiazide, hydroxyzine, insulin aspart protamine- aspart, insulin detemir, levemir flextouch, magnesium oxide, metformin, mometasone, oxycodone hcl, oxycodone hcl, oxycodone hcl, pravastatin, pregabalin, tamsulosin, valsartan, victoza, and vitamin d (ergocalciferol).  Current Outpatient Prescriptions on File Prior to Visit  Medication Sig  . hydrochlorothiazide (HYDRODIURIL) 25 MG tablet Take 25 mg by mouth 2 (two) times daily.  . insulin aspart protamine- aspart (NOVOLOG MIX 70/30) (70-30) 100 UNIT/ML injection Inject into the skin.  Marland Kitchen insulin detemir (LEVEMIR) 100 UNIT/ML injection Inject 30 Units into the skin at bedtime.  . metFORMIN (GLUCOPHAGE) 1000 MG tablet Take 1,000 mg by mouth 2 (two) times daily with a meal.  . tamsulosin (FLOMAX) 0.4 MG CAPS capsule Take 0.4 mg by mouth 2 (two)  times daily.   No current facility-administered medications on file prior to visit.    ROS  Constitutional: Afebrile, no chills, well hydrated and well nourished Gastrointestinal: negative Musculoskeletal:negative Neurological: negative Behavioral/Psych: negative  PFSH  Medical:  Alan Schmidt  has a past medical history of Anxiety; Kidney stones; Chronic lumbar pain; Hyperlipidemia; Hypertension; Depression; Migraines; and Chronic hip pain (Right) (12/05/2014). Family: family history includes Cancer in his mother; Heart disease in his father; Stroke in his father. Surgical:  has past surgical history that includes Total hip arthroplasty; Tonsillectomy; and right hip surgery. Tobacco:  reports that he has never smoked. He does not have any smokeless tobacco history on file. Alcohol:  reports that he does not drink alcohol. Drug:  has no drug history on file.  Physical Exam  Vitals:  Today's Vitals   03/06/15 0930 03/06/15 0933  BP:  160/79  Pulse: 74   Temp: 97.6 F (36.4 C)   Resp: 16   Height: 6\' 1"  (1.854 m)   Weight: 250 lb (113.399 kg)   SpO2: 97%   PainSc: 8  8   PainLoc: Back     Calculated BMI: Body mass index is 32.99 kg/(m^2).  General appearance: alert, cooperative, appears stated age and no distress Eyes: PERLA Respiratory: No evidence respiratory distress, no audible rales or ronchi and no use of accessory muscles of respiration  Cervical Spine Inspection: Normal anatomy Alignment: Symetrical ROM: Adequate  Upper Extremities Inspection: No gross anomalies detected ROM: Decreased for both shoulders Sensory: Normal Motor: Unremarkable  Thoracic Spine Inspection: No gross anomalies detected Alignment: Symetrical ROM: Adequate  Lumbar Spine Inspection: No gross anomalies detected Alignment: Symetrical ROM: Decreased Palpation: Tender Provocative Tests:  Lumbar Hyperextension  and rotation test:  Positive bilaterally with the right being worst on  the left. Patrick's Maneuver: deferred Gait: WNL  Lower Extremities Inspection: No gross anomalies detected ROM: Adequate Sensory:  Normal Motor: Unremarkable  Toe walk (S1): WNL  Heal walk (L5): WNL  Assessment & Plan  Primary Diagnosis & Pertinent Problem List: The primary encounter diagnosis was Chronic pain. Diagnoses of Chronic low back pain (R>L), Chronic neck pain (L>R), Encounter for therapeutic drug level monitoring, Long term current use of opiate analgesic, Neuropathic pain, Neurogenic pain, Lumbar facet syndrome (Bilateral) (R>L), Osteoarthritis of spine with radiculopathy, lumbar region, Chronic pain of lower extremity, unspecified laterality, Chronic shoulder pain (Bilateral) (R>L), Occipital neuralgia (Left), and Cervicogenic headache (Left) were also pertinent to this visit.  Visit Diagnosis: 1. Chronic pain   2. Chronic low back pain (R>L)   3. Chronic neck pain (L>R)   4. Encounter for therapeutic drug level monitoring   5. Long term current use of opiate analgesic   6. Neuropathic pain   7. Neurogenic pain   8. Lumbar facet syndrome (Bilateral) (R>L)   9. Osteoarthritis of spine with radiculopathy, lumbar region   10. Chronic pain of lower extremity, unspecified laterality   11. Chronic shoulder pain (Bilateral) (R>L)   12. Occipital neuralgia (Left)   13. Cervicogenic headache (Left)     Assessment: Chronic low back pain (Location of Primary Source of Pain) (Bilateral) (R>L) This pain is likely to be secondary to lumbar facet syndrome. The patient has had more than 2 diagnostic lumbar facet blocks under fluoroscopic and IV sedation with more than 50% relief of the pain for the duration of the local anesthetic. In addition, the diagnostic blocks have shown that his leg pain is also secondary to this facet DJD. At this time, we are pending to 2 radiofrequency ablation of the lumbar facets, starting with the right side.  Lumbar facet syndrome (Bilateral)  (R>L) This diagnosis was confirmed by way of at least 2 diagnostic, bilateral, lumbar facet blocks done under fluoroscopic guidance, at 2 different locations. The patient is pending radiofrequency ablation of the lumbar facets, starting with the right side and following with the left side 6 weeks later.    Plan of Care  Pharmacotherapy (Medications Ordered): Meds ordered this encounter  Medications  . Oxycodone HCl 10 MG TABS    Sig: Take 1 tablet (10 mg total) by mouth every 6 (six) hours as needed.    Dispense:  120 tablet    Refill:  0    Do not place this medication, or any other prescription from our practice, on "Automatic Refill". Patient may have prescription filled one day early if pharmacy is closed on scheduled refill date. Do not fill until: 03/06/15 To last until: 04/05/15  . Oxycodone HCl 10 MG TABS    Sig: Take 1 tablet (10 mg total) by mouth every 6 (six) hours as needed.    Dispense:  120 tablet    Refill:  0    Do not place this medication, or any other prescription from our practice, on "Automatic Refill". Patient may have prescription filled one day early if pharmacy is closed on scheduled refill date. Do not fill until: 04/05/15 To last until: 05/05/15  . Oxycodone HCl 10 MG TABS    Sig: Take 1 tablet (10 mg total) by mouth every 6 (six) hours as needed.    Dispense:  120 tablet    Refill:  0    Do not place this medication,  or any other prescription from our practice, on "Automatic Refill". Patient may have prescription filled one day early if pharmacy is closed on scheduled refill date. Do not fill until: 05/05/15 To last until: 06/04/15  . pregabalin (LYRICA) 75 MG capsule    Sig: Take 1 capsule (75 mg total) by mouth every 12 (twelve) hours.    Dispense:  60 capsule    Refill:  2    Do not place this medication, or any other prescription from our practice, on "Automatic Refill". Patient may have prescription filled one day early if pharmacy is closed on  scheduled refill date. Do not fill until: 12/05/14 To last until: 03/05/15    [facility] & Procedure Ordered: Orders Placed This Encounter  Procedures  . Radiofrequency,Lumbar    Standing Status: Standing     Number of Occurrences: 1     Standing Expiration Date: 03/05/2016    Scheduling Instructions:     Side(s): Right-sided     Level(s): L2, L3, L4, L5, & S1 Medial Branch Nerves     Sedation: With Sedation.     Timeframe: PRN. The patient will call when ready to proceed.    Order Specific Question:  Where will this procedure be performed?    Answer:  [facility]  . Drugs of abuse screen w/o alc, rtn urine-sln    Volume: 10 ml(s). Minimum 3 ml of urine is needed. Document temperature of fresh sample. Indications: Long term (current) use of opiate analgesic (Z79.891) Test#: [id number] (ToxAssure Select-13)  . Comprehensive metabolic panel    Order Specific Question:  Has the patient fasted?    Answer:  No  . C-reactive protein  . Magnesium  . Sedimentation rate  . Vitamin B12    Indication: Bone Pain (M89.9)    Imaging Ordered: None  Interventional Therapies: Scheduled: None at this time. PRN Procedures: Bilateral lumbar facet radiofrequency ablation under fluoroscopic guidance and IV sedation, starting with the right side.    Referral(s) or Consult(s): None at this time.  Medications administered during this visit: Mr. Montanez had no medications administered during this visit.  Future Appointments Date Time Provider [place]  05/29/2015 9:00 AM Milinda Pointer, MD [facility] None    Primary Care Physician: Cletis Athens, MD Location: [facility] Note by: Kathlen Brunswick. Dossie Arbour, M.D, DABA, DABAPM, DABPM, DABIPP, FIPP

## 2015-03-06 NOTE — Assessment & Plan Note (Signed)
This diagnosis was confirmed by way of at least 2 diagnostic, bilateral, lumbar facet blocks done under fluoroscopic guidance, at 2 different locations. The patient is pending radiofrequency ablation of the lumbar facets, starting with the right side and following with the left side 6 weeks later.

## 2015-03-06 NOTE — Patient Instructions (Addendum)
Patient instructed to get labwork drawn in Pre admit testing. IF diet  (Inflammatory diet)  Instructions for radiofrequency: Do not eat or drink for 6 hours prior to procedure Bring a driver Do not take insulin the morning of procedure Take blood pressure medication the morning of procedure with a small amt of water.  Radiofrequency Lesioning Radiofrequency lesioning is a procedure that is performed to relieve pain. The procedure is often used for back, neck, or arm pain. Radiofrequency lesioning involves the use of a machine that creates radio waves to make heat. During the procedure, the heat is applied to the nerve that carries the pain signal. The heat damages the nerve and interferes with the pain signal. Pain relief usually lasts for 6 months to 1 year. LET [facility] CARE PROVIDER KNOW ABOUT:  Any allergies you have.  All medicines you are taking, including vitamins, herbs, eye drops, creams, and over-the-counter medicines.  Previous problems you or members of your family have had with the use of anesthetics.  Any blood disorders you have.  Previous surgeries you have had.  Any medical conditions you have.  Whether you are pregnant or may be pregnant. RISKS AND COMPLICATIONS Generally, this is a safe procedure. However, problems may occur, including:  Pain or soreness at the injection site.  Infection at the injection site.  Damage to nerves or blood vessels. BEFORE THE PROCEDURE  Ask your health care provider about:  Changing or stopping your regular medicines. This is especially important if you are taking diabetes medicines or blood thinners.  Taking medicines such as aspirin and ibuprofen. These medicines can thin your blood. Do not take these medicines before your procedure if your health care provider instructs you not to.  Follow instructions from your health care provider about eating or drinking restrictions.  Plan to have someone take you home after the  procedure.  If you go home right after the procedure, plan to have someone with you for 24 hours. PROCEDURE  You will be given one or more of the following:  A medicine to help you relax (sedative).  A medicine to numb the area (local anesthetic).  You will be awake during the procedure. You will need to be able to talk with the health care provider during the procedure.  With the help of a type of X-ray (fluoroscopy), the health care provider will insert a radiofrequency needle into the area to be treated.  Next, a wire that carries the radio waves (electrode) will be put through the radiofrequency needle. An electrical pulse will be sent through the electrode to verify the correct nerve. You will feel a tingling sensation, and you may have muscle twitching.  Then, the tissue that is around the needle tip will be heated by an electric current that is passed using the radiofrequency machine. This will numb the nerves.  A bandage (dressing) will be put on the insertion area after the procedure is done. The procedure may vary among health care providers and hospitals. AFTER THE PROCEDURE  Your blood pressure, heart rate, breathing rate, and blood oxygen level will be monitored often until the medicines you were given have worn off.  Return to your normal activities as directed by your health care provider.   This information is not intended to replace advice given to you by your health care provider. Make sure you discuss any questions you have with your health care provider.   Document Released: 09/17/2010 Document Revised: 10/10/2014 Document Reviewed: 02/26/2014 Elsevier  Interactive Patient Education 2016 [employer]  

## 2015-03-12 LAB — TOXASSURE SELECT 13 (MW), URINE: PDF: 0

## 2015-03-16 NOTE — Progress Notes (Signed)
Quick Note:   Normal chloride levels are between 95 and 107 mEq/L. Low levels may be due to: Addison disease; Bartter syndrome; burns; congestive heart failure; dehydration; excessive sweating; hyperaldosteronism; metabolic alkalosis; respiratory acidosis (compensated); Syndrome of inappropriate diuretic hormone secretion (SIADH); or vomiting.  Normal fasting (NPO x 8 hours) glucose levels are between 65-99 mg/dl, with 2 hour fasting, levels are usually less than 140 mg/dl. Any random blood glucose level greater than 200 mg/dl is considered to be Diabetes.  Normal levels of AST are between 5 and 40 U/L. Pregnancy, a muscle injection, or even strenuous exercise may increase AST levels. Acute burns, surgery, and seizures may raise AST levels as well. Very high levels of AST (> 10 X normal) are usually due to acute hepatitis. Levels > 100 X normal can be seen with liver exposure to hepatotoxic substances. Moderate increases may be seen in other diseases of the liver, especially when the bile ducts are blocked, or with cirrhosis or certain cancers of the liver. AST may also increase after heart attacks and with muscle injury, usually to a much greater degree than ALT. In most types of liver disease, the ALT level is higher than AST and the AST/ALT ratio will be low (less than 1). With heart or muscle injury, AST is often much higher than ALT (often 3-5 times as high) and levels tend to stay higher than ALT for longer than with liver injury.  The normal range for ALT (SGPT) values is about 7 to 56 units per liter. Muscle injections and/or strenuous exercise, may increase alanine aminotransferase (ALT) levels. Many drugs may raise levels by causing liver damage. Other causes of moderate increases include bile duct obstruction, cirrhosis, heart damage, alcohol abuse, and liver tumors. In most types of liver diseases, the ALT level is higher than AST, leading to a low AST/ALT ratio( >1). Exceptions include  alcoholic or acute hepatitis, cirrhosis, as well as heart and/or muscle injury. Levels (>10 X normal) may be seen with acute hepatitis while results (sometimes >100 X normal) may indicate liver exposure to toxic substances.  ______

## 2015-03-16 NOTE — Progress Notes (Signed)

## 2015-03-16 NOTE — Progress Notes (Signed)
Quick Note:  NOTE: This forensic urine drug screen (UDS) test was conducted using a state-of-the-art ultra high performance liquid chromatography and mass spectrometry system (UPLC/MS-MS), the most sophisticated and accurate method available. UPLC/MS-MS is 1,000 times more precise and accurate than standard gas chromatography and mass spectrometry (GC/MS). This system can analyze 26 drug categories and 180 drug compounds.  The results of this test came back with unexpected findings. Unreported Benzodiazepine. The 2016 CDC Guideline Recommendations state: "Clinicians should avoid prescribing opioid pain medication and benzodiazepines concurrently whenever possible (recommendation category: A, evidence type: 3)" - Recommendations and Reports, [place] Department of Health and Human Services/Centers for Disease Control and Prevention 32 MMWR / April 20, 2014 / Vol. 65 / No. 1 / Page 31-2 / item 11.  Benzodiazepines and opioids both cause central nervous system depression and can decrease respiratory drive. Concurrent use is likely to put patients at greater risk for potentially fatal overdose.  The contextual evidence review found evidence in epidemiologic series of concurrent benzodiazepine use in large proportions of opioid-related overdose deaths, and a case-cohort study found concurrent benzodiazepine prescription with opioid prescription to be associated with a near quadrupling of risk for overdose death compared with opioid prescription alone.  A commonly used tapering schedule that has been used safely and with moderate success is a reduction of the benzodiazepine dose by 25% every 1-2 weeks.  Examples of oral benzodiazepines are: alprazolam (Xanax, Xanax XR) clobazam (Onfi) clonazepam (Klonopin) clorazepate (Tranxene, Tranxene SD) chlordiazepoxide (Librium) diazepam (Valium, Diastat Acudial, Diastat) estazolam (Prosom is a discontinued brand in the [place]) lorazepam (Ativan) oxazepam (Serax is a  discontinued brand in the [place]) temazepam (Restoril) triazolam (Halcion) ______

## 2015-03-16 NOTE — Progress Notes (Signed)
Quick Note:  Lab results reviewed and found to be within normal limits. ______ 

## 2015-03-18 ENCOUNTER — Ambulatory Visit (INDEPENDENT_AMBULATORY_CARE_PROVIDER_SITE_OTHER): Payer: Medicare Other | Admitting: Family Medicine

## 2015-03-18 ENCOUNTER — Encounter: Payer: Self-pay | Admitting: Family Medicine

## 2015-03-18 VITALS — BP 130/78 | HR 78 | Temp 97.7°F | Ht 72.0 in | Wt 256.0 lb

## 2015-03-18 DIAGNOSIS — Z96641 Presence of right artificial hip joint: Secondary | ICD-10-CM | POA: Diagnosis not present

## 2015-03-18 DIAGNOSIS — E119 Type 2 diabetes mellitus without complications: Secondary | ICD-10-CM

## 2015-03-18 DIAGNOSIS — E1142 Type 2 diabetes mellitus with diabetic polyneuropathy: Secondary | ICD-10-CM

## 2015-03-18 DIAGNOSIS — Z794 Long term (current) use of insulin: Secondary | ICD-10-CM | POA: Diagnosis not present

## 2015-03-18 DIAGNOSIS — F329 Major depressive disorder, single episode, unspecified: Secondary | ICD-10-CM | POA: Diagnosis not present

## 2015-03-18 DIAGNOSIS — F32A Depression, unspecified: Secondary | ICD-10-CM

## 2015-03-18 MED ORDER — ALPRAZOLAM 0.5 MG PO TABS: 0.5000 mg | ORAL_TABLET | Freq: Two times a day (BID) | ORAL | Status: DC | PRN

## 2015-03-18 NOTE — Progress Notes (Signed)
Pre visit review using our clinic review tool, if applicable. No additional management support is needed unless otherwise documented below in the visit note. 

## 2015-03-18 NOTE — Patient Instructions (Signed)
Nice to meet you. We will check your blood sugar and A1c today. Please continue to follow up with your pain management physician for your right hip pain. Please monitor your depression and anxiety. If this worsens please let us know. If you develop thoughts of harming herself or others, blood sugar less than 80, or any new or change in symptoms please seek medical attention.

## 2015-03-19 ENCOUNTER — Encounter: Payer: Self-pay | Admitting: Family Medicine

## 2015-03-19 DIAGNOSIS — E119 Type 2 diabetes mellitus without complications: Secondary | ICD-10-CM | POA: Insufficient documentation

## 2015-03-19 NOTE — Assessment & Plan Note (Signed)
Stable on Xanax. Discussed seeing a counselor versus SSRI in addition to Xanax, though patient declined. We'll refill Xanax. We'll continue to monitor.

## 2015-03-19 NOTE — Assessment & Plan Note (Signed)
CBGs ranging from normal range to slightly above normal range. Intermittent hypoglycemic symptoms though no true hypoglycemia. Discussed diabetic regimen. Discussed hypoglycemic protocol. We will check an A1c and a CMP today. Given return precautions.

## 2015-03-19 NOTE — Assessment & Plan Note (Signed)
Patient notes chronic right hip pain followed by pain management. Notes he is going for radiofrequency ablation in his back to help with this. Patient will continue treatment per pain management.

## 2015-03-19 NOTE — Progress Notes (Signed)
Patient ID: Alan Schmidt, male   DOB: [birth year], 70 y.o.   MRN: [id number]  [name], MD Phone: [phone]  Alan Schmidt is a 70 y.o. male who presents today for new patient visit.  DIABETES Disease Monitoring: Blood Sugar ranges-120-210, 263 this am, then took novolog 10 u and dropped to 85, patient notes this is low for him and he got mildly sweaty and shaky, notes he ate and then it came up to 163. Polyuria/phagia/dipsia- mild polyuria, no polydipsia Medications: Compliance- taking victoza, novolog, levemir, and metformin Hypoglycemic symptoms- yes, though no true hypoglycemia, denies cbg <80  Depression and anxiety: Patient notes he has been dealing with depression and anxiety for some time. His mom died several years ago and he is been dealing with her estate since that time and had some difficulties with a sibling. Notes he takes Xanax 2 times a day for his anxiety. Nothing for depression. No SI or HI. Has not seen a counselor recently.  Right hip pain: Chronic pain. Followed by pain management. He had a hip replacement in the past followed by a revision. He is going to undergo radiofrequency ablation and is back to help with his discomfort. He notes he can only walk short distances due to the discomfort in his right ear.  Active Ambulatory Problems    Diagnosis Date Noted  . Polyneuropathy ([place]) 05/02/2014  . Paraparesis ([place]) 05/02/2014  . Chronic pain 12/05/2014  . Chronic low back pain (Location of Primary Source of Pain) (Bilateral) (R>L) 12/05/2014  . Lumbar facet syndrome (Bilateral) (R>L) 12/05/2014  . Lumbar spondylosis 12/05/2014  . Diabetic peripheral neuropathy ([place]) 12/05/2014  . Long term current use of opiate analgesic 12/05/2014  . Long term prescription opiate use 12/05/2014  . Opiate use 12/05/2014  . Opiate dependence ([place]) 12/05/2014  . Encounter for therapeutic drug level monitoring 12/05/2014  . Chronic neck pain (midline over the C7 spinous  processes) (L>R) 12/05/2014  . Neurogenic pain 12/05/2014  . Neuropathic pain 12/05/2014  . Contrast dye allergy 12/05/2014  . Chronic lower extremity pain (Location of Secondary source of pain) (Bilateral) (R>L) 12/05/2014  . 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) (Right L5 dermatome) 12/05/2014  . 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 Chronic hip pain (Right) 12/05/2014  . Class I Morbid obesity ([place]) (68% higher incidence of chronic low back pain) 12/05/2014  . Essential hypertension 12/05/2014  . GERD (gastroesophageal reflux disease) 12/05/2014  . Obstructive sleep apnea 12/05/2014  . Hyperlipidemia 12/05/2014  . Chronic kidney disease (CKD) 12/05/2014  . Generalized anxiety disorder 12/05/2014  . Depression 12/05/2014  . Abnormal nerve conduction studies (severe bilateral lower extremity polyneuropathy) 12/05/2014  . Chronic shoulder pain (Bilateral) (R>L) 03/06/2015  . Occipital neuralgia (Left) 03/06/2015  . Cervicogenic headache (Left) 03/06/2015  . Diabetes ([place]) 03/19/2015   Resolved Ambulatory Problems    Diagnosis Date Noted  . Insulin dependent diabetes mellitus ([place]) 12/05/2014   Past Medical History  Diagnosis Date  . Anxiety   . Kidney stones   . Chronic lumbar pain   . Hypertension   . Migraines   . Chronic hip pain (Right) 12/05/2014    Family History  Problem Relation Age of Onset  . Cancer Mother   . Heart disease Father   . Stroke Father     Social History   Social History  . Marital Status: Unknown    Spouse Name: N/A  . Number of Children: N/A  . Years of Education:  N/A   Occupational History  . Not on file.   Social History Main Topics  . Smoking status: Former Research scientist (life sciences)  . Smokeless tobacco: Not on file  . Alcohol Use: No  . Drug Use: No  . Sexual Activity: Not on file   Other Topics Concern  . Not on file   Social History Narrative    ROS   General:  Negative for nexplained weight loss, fever Skin:  Negative for new or changing mole, sore that won't heal HEENT: Negative for trouble hearing, trouble seeing, ringing in ears, mouth sores, hoarseness, change in voice, dysphagia. CV:  Negative for chest pain, dyspnea, edema, palpitations Resp: Negative for cough, dyspnea, hemoptysis GI: Negative for nausea, vomiting, diarrhea, constipation, abdominal pain, melena, hematochezia. GU: Negative for dysuria, incontinence, urinary hesitance, hematuria, vaginal or penile discharge, polyuria, sexual difficulty, lumps in testicle or breasts MSK: Positive for right hip pain, Negative for muscle cramps or aches, other joint pain or swelling Neuro: Negative for headaches, weakness, numbness, dizziness, passing out/fainting Psych: Positive for depression, anxiety, negative memory problems  Objective  Physical Exam Filed Vitals:   03/18/15 1337  BP: 130/78  Pulse: 78  Temp: 97.7 F (36.5 C)   Physical Exam  Constitutional: He is well-developed, well-nourished, and in no distress.  HENT:  Head: Normocephalic and atraumatic.  Right Ear: External ear normal.  Left Ear: External ear normal.  Mouth/Throat: Oropharynx is clear and moist. No oropharyngeal exudate.  Eyes: Conjunctivae are normal. Pupils are equal, round, and reactive to light.  Neck: Neck supple.  Cardiovascular: Normal rate, regular rhythm and normal heart sounds.  Exam reveals no gallop and no friction rub.   No murmur heard. Pulmonary/Chest: Effort normal and breath sounds normal. No respiratory distress. He has no wheezes. He has no rales.  Abdominal: Soft. Bowel sounds are normal. He exhibits no distension. There is no tenderness. There is no rebound and no guarding.  Musculoskeletal: He exhibits no edema.  Full range of motion right hip and left hip with no pain, no tenderness of bilateral hips, no tenderness of knees or swelling of knees or erythema of the knees or warmth of the knees  Lymphadenopathy:    He has no cervical  adenopathy.  Neurological: He is alert.  CN 2-12 intact, 5/5 strength in bilateral biceps, triceps, grip, quads, hamstrings, plantar and dorsiflexion, sensation to light touch intact in bilateral UE and LE, normal gait, 2+ patellar reflexes  Skin: Skin is warm and dry. He is not diaphoretic.  Psychiatric:  Depressed mood, normal affect     Assessment/Plan:   Diabetes ([facility]) CBGs ranging from normal range to slightly above normal range. Intermittent hypoglycemic symptoms though no true hypoglycemia. Discussed diabetic regimen. Discussed hypoglycemic protocol. We will check an A1c and a CMP today. Given return precautions.  Depression Stable on Xanax. Discussed seeing a counselor versus SSRI in addition to Xanax, though patient declined. We'll refill Xanax. We'll continue to monitor.  History of total hip replacement (Right) Patient notes chronic right hip pain followed by pain management. Notes he is going for radiofrequency ablation in his back to help with this. Patient will continue treatment per pain management.    Orders Placed This Encounter  Procedures  . Comp Met (CMET)  . HgB A1c    Meds ordered this encounter  Medications  . ALPRAZolam (XANAX) 0.5 MG tablet    Sig: Take 1 tablet (0.5 mg total) by mouth 2 (two) times daily as needed for anxiety.  Dispense:  60 tablet    Refill:  0     [name]

## 2015-03-25 ENCOUNTER — Other Ambulatory Visit: Payer: Self-pay | Admitting: Family Medicine

## 2015-04-09 ENCOUNTER — Ambulatory Visit (INDEPENDENT_AMBULATORY_CARE_PROVIDER_SITE_OTHER): Payer: Medicare Other

## 2015-04-09 ENCOUNTER — Ambulatory Visit (INDEPENDENT_AMBULATORY_CARE_PROVIDER_SITE_OTHER): Payer: Medicare Other | Admitting: Podiatry

## 2015-04-09 ENCOUNTER — Encounter: Payer: Self-pay | Admitting: Podiatry

## 2015-04-09 ENCOUNTER — Other Ambulatory Visit (INDEPENDENT_AMBULATORY_CARE_PROVIDER_SITE_OTHER): Payer: Medicare Other

## 2015-04-09 DIAGNOSIS — Z23 Encounter for immunization: Secondary | ICD-10-CM

## 2015-04-09 DIAGNOSIS — B351 Tinea unguium: Secondary | ICD-10-CM | POA: Diagnosis not present

## 2015-04-09 DIAGNOSIS — R52 Pain, unspecified: Secondary | ICD-10-CM | POA: Diagnosis not present

## 2015-04-09 DIAGNOSIS — M79676 Pain in unspecified toe(s): Secondary | ICD-10-CM

## 2015-04-09 DIAGNOSIS — E1149 Type 2 diabetes mellitus with other diabetic neurological complication: Secondary | ICD-10-CM

## 2015-04-09 DIAGNOSIS — Z794 Long term (current) use of insulin: Secondary | ICD-10-CM | POA: Diagnosis not present

## 2015-04-09 DIAGNOSIS — M795 Residual foreign body in soft tissue: Secondary | ICD-10-CM | POA: Diagnosis not present

## 2015-04-09 DIAGNOSIS — E119 Type 2 diabetes mellitus without complications: Secondary | ICD-10-CM | POA: Diagnosis not present

## 2015-04-09 NOTE — Patient Instructions (Signed)
Call your doctor in order to see about getting a tetanus shot.   ------------------------------  Diabetes and Foot Care Diabetes may cause you to have problems because of poor blood supply (circulation) to your feet and legs. This may cause the skin on your feet to become thinner, break easier, and heal more slowly. Your skin may become dry, and the skin may peel and crack. You may also have nerve damage in your legs and feet causing decreased feeling in them. You may not notice minor injuries to your feet that could lead to infections or more serious problems. Taking care of your feet is one of the most important things you can do for yourself.  HOME CARE INSTRUCTIONS  Wear shoes at all times, even in the house. Do not go barefoot. Bare feet are easily injured.  Check your feet daily for blisters, cuts, and redness. If you cannot see the bottom of your feet, use a mirror or ask someone for help.  Wash your feet with warm water (do not use hot water) and mild soap. Then pat your feet and the areas between your toes until they are completely dry. Do not soak your feet as this can dry your skin.  Apply a moisturizing lotion or petroleum jelly (that does not contain alcohol and is unscented) to the skin on your feet and to dry, brittle toenails. Do not apply lotion between your toes.  Trim your toenails straight across. Do not dig under them or around the cuticle. File the edges of your nails with an emery board or nail file.  Do not cut corns or calluses or try to remove them with medicine.  Wear clean socks or stockings every day. Make sure they are not too tight. Do not wear knee-high stockings since they may decrease blood flow to your legs.  Wear shoes that fit properly and have enough cushioning. To break in new shoes, wear them for just a few hours a day. This prevents you from injuring your feet. Always look in your shoes before you put them on to be sure there are no objects inside.  Do  not cross your legs. This may decrease the blood flow to your feet.  If you find a minor scrape, cut, or break in the skin on your feet, keep it and the skin around it clean and dry. These areas may be cleansed with mild soap and water. Do not cleanse the area with peroxide, alcohol, or iodine.  When you remove an adhesive bandage, be sure not to damage the skin around it.  If you have a wound, look at it several times a day to make sure it is healing.  Do not use heating pads or hot water bottles. They may burn your skin. If you have lost feeling in your feet or legs, you may not know it is happening until it is too late.  Make sure your health care provider performs a complete foot exam at least annually or more often if you have foot problems. Report any cuts, sores, or bruises to your health care provider immediately. SEEK MEDICAL CARE IF:   You have an injury that is not healing.  You have cuts or breaks in the skin.  You have an ingrown nail.  You notice redness on your legs or feet.  You feel burning or tingling in your legs or feet.  You have pain or cramps in your legs and feet.  Your legs or feet are numb.  Your feet always feel cold. SEEK IMMEDIATE MEDICAL CARE IF:   There is increasing redness, swelling, or pain in or around a wound.  There is a red line that goes up your leg.  Pus is coming from a wound.  You develop a fever or as directed by your health care provider.  You notice a bad smell coming from an ulcer or wound.   This information is not intended to replace advice given to you by your health care provider. Make sure you discuss any questions you have with your health care provider.   Document Released: 01/17/2000 Document Revised: 09/21/2012 Document Reviewed: 06/28/2012 Elsevier Interactive Patient Education [employer].

## 2015-04-09 NOTE — Progress Notes (Signed)
Patient stepped on a nail this am, went to his podiatrist, they recommended a tdap booster as he is unsure of the last one he had.  Received tdap in left deltoid.  Patient tolerated well.

## 2015-04-09 NOTE — Progress Notes (Signed)
   Subjective:    Patient ID: Alan Schmidt, male    DOB: 1945/12/26, 70 y.o.   MRN: [id number]  HPI  70 year old male presents the office they for concerns of a possible insulin needle In his left foot. He states this when he stepped on the needle. He is unsure if he went into a skin or not he would have the area checked. Denies any bleeding from the area and any pain. He states that he went to his heel. He has a states his nails are painful elongated and trimmed as he cannot do them himself. Denies any swelling or redness or drainage. He denies any claudication symptoms. No other complaints at this time.   Review of Systems  All other systems reviewed and are negative.      Objective:   Physical Exam General: AAO x3, NAD  Dermatological: Over the area the possible puncture sites the left heel there is no evidence of a puncture wound at this time. There is no drainage or pus. No edema. Nails appear to be hypertrophic, dystrophic, brittle, discolored, elongated 10. No surrounding erythema or drainage. There is tenderness nails 1-5 bilaterally.  Vascular: Dorsalis Pedis artery and Posterior Tibial artery pedal pulses are 2/4 bilateral with immedate capillary fill time. Pedal hair growth present. No varicosities and no lower extremity edema present bilateral. There is no pain with calf compression, swelling, warmth, erythema.   Neruologic: Sensation somewhat decreased with Derrel Nip monofilament, vibratory sensation intact.  Musculoskeletal: No gross boney pedal deformities bilateral. No pain, crepitus, or limitation noted with foot and ankle range of motion bilateral. Muscular strength 5/5 in all groups tested bilateral.  Gait: Unassisted, Nonantalgic.     Assessment & Plan:  70 year old male possible puncture wound left heel; symptomatic onychomycosis -Treatment options discussed including all alternatives, risks, and complications  1. Left puncture wound -X-rays were  obtained and reviewed. There is no evidence of foreign body identified at this time. Continue to monitor the area. -Follow-up with PCP updated tetanus shot. He verbalized or standing. -Monitor for any clinical signs or symptoms of infection and directed to call the office immediately should any occur or go to the ER.  2. Symptomatic onychomycosis -Nails debrided 10 without complications or bleeding -Daily foot inspection  -Follow-up in 3 months or sooner if any problems arise. In the meantime, encouraged to call the office with any questions, concerns, change in symptoms.   Celesta Gentile, DPM

## 2015-04-10 LAB — COMPREHENSIVE METABOLIC PANEL
ALBUMIN: 4.2 g/dL (ref 3.5–5.2)
ALK PHOS: 80 U/L (ref 39–117)
ALT: 52 U/L (ref 0–53)
AST: 39 U/L — AB (ref 0–37)
BILIRUBIN TOTAL: 0.3 mg/dL (ref 0.2–1.2)
BUN: 12 mg/dL (ref 6–23)
CALCIUM: 9.7 mg/dL (ref 8.4–10.5)
CO2: 27 mEq/L (ref 19–32)
CREATININE: 1.21 mg/dL (ref 0.40–1.50)
Chloride: 95 mEq/L — ABNORMAL LOW (ref 96–112)
GFR: 76.43 mL/min (ref >60.00)
Glucose, Bld: 103 mg/dL — ABNORMAL HIGH (ref 70–99)
Potassium: 4.7 mEq/L (ref 3.5–5.1)
SODIUM: 134 meq/L — AB (ref 135–145)
TOTAL PROTEIN: 7.6 g/dL (ref 6.0–8.3)

## 2015-04-10 LAB — HEMOGLOBIN A1C: HEMOGLOBIN A1C: 9 % — AB (ref 4.6–6.5)

## 2015-04-16 ENCOUNTER — Ambulatory Visit (INDEPENDENT_AMBULATORY_CARE_PROVIDER_SITE_OTHER): Payer: Medicare Other | Admitting: Family Medicine

## 2015-04-16 ENCOUNTER — Encounter: Payer: Self-pay | Admitting: Family Medicine

## 2015-04-16 VITALS — BP 126/74 | HR 89 | Temp 97.9°F | Ht 72.0 in | Wt 256.0 lb

## 2015-04-16 DIAGNOSIS — N5312 Painful ejaculation: Secondary | ICD-10-CM

## 2015-04-16 DIAGNOSIS — E1142 Type 2 diabetes mellitus with diabetic polyneuropathy: Secondary | ICD-10-CM

## 2015-04-16 DIAGNOSIS — I10 Essential (primary) hypertension: Secondary | ICD-10-CM | POA: Diagnosis not present

## 2015-04-16 DIAGNOSIS — N400 Enlarged prostate without lower urinary tract symptoms: Secondary | ICD-10-CM | POA: Diagnosis not present

## 2015-04-16 DIAGNOSIS — Z794 Long term (current) use of insulin: Secondary | ICD-10-CM

## 2015-04-16 DIAGNOSIS — H9201 Otalgia, right ear: Secondary | ICD-10-CM

## 2015-04-16 MED ORDER — INSULIN DETEMIR 100 UNIT/ML ~~LOC~~ SOLN: SUBCUTANEOUS | Status: DC

## 2015-04-16 MED ORDER — ACCU-CHEK FASTCLIX LANCETS MISC: 1.0000 | Freq: Three times a day (TID) | Status: DC

## 2015-04-16 MED ORDER — ACCU-CHEK COMPACT PLUS VI STRP: ORAL_STRIP | Status: DC

## 2015-04-16 NOTE — Patient Instructions (Signed)
Nice to see you. We will increase your Levemir dosing to 17 units in the morning and 16 units in the evening. Please continue to check your blood sugars. Your goal is between 100-150. If your persistently greater than 200 or below 80 leads let us know. If you develop symptoms of low blood sugar please check her sugar and eat something. We will refer you to urology for evaluation of your painful ejaculation and BPH.

## 2015-04-16 NOTE — Progress Notes (Signed)
Pre visit review using our clinic review tool, if applicable. No additional management support is needed unless otherwise documented below in the visit note. 

## 2015-04-17 ENCOUNTER — Other Ambulatory Visit: Payer: Self-pay

## 2015-04-17 DIAGNOSIS — N400 Enlarged prostate without lower urinary tract symptoms: Secondary | ICD-10-CM | POA: Insufficient documentation

## 2015-04-17 DIAGNOSIS — H9201 Otalgia, right ear: Secondary | ICD-10-CM | POA: Insufficient documentation

## 2015-04-17 MED ORDER — ALPRAZOLAM 0.5 MG PO TABS: 0.5000 mg | ORAL_TABLET | Freq: Two times a day (BID) | ORAL | Status: DC | PRN

## 2015-04-17 NOTE — Assessment & Plan Note (Signed)
At goal. Alan Schmidt continue his current medications.

## 2015-04-17 NOTE — Assessment & Plan Note (Signed)
No pain at this time. Benign exam. Patient will continue to monitor.

## 2015-04-17 NOTE — Progress Notes (Signed)
Patient ID: Alan Schmidt, male   DOB: [birth year], 70 y.o.   MRN: [id number]  Alan Rumps, MD Phone: [phone]  Alan Schmidt is a 70 y.o. male who presents today for follow-up.  Right ear pain: Patient notes this occurred last night. No other upper respiratory symptoms. No fever. Notes he had an ear infection a month ago and was put on antibiotics. Notes he has had lots of ear wax previously. No pain at this time.  HYPERTENSION Disease Monitoring Home BP Monitoring not checking Chest pain- no    Dyspnea- no Medications Compliance-  taking carvedilol, hydrochlorothiazide, and valsartan.  Edema- no  DIABETES Disease Monitoring: Blood Sugar ranges-144 to 200s, reports today was 144 fasting Polyuria/phagia/dipsia- she does note polyuria, no polydipsia or polyphagia      Medications: Compliance- taking Victoza, Levemir 15 units twice a day, and metformin 1000 mg twice a day, has not taken NovoLog in the last several weeks. He notes he takes this very infrequently. Hypoglycemic symptoms- none since her last visit.  BPH: Patient notes nocturia 4. Notes he urinates every couple of hours during the day. Is taking Flomax twice daily. No history of prostate cancer. He also notes pain with ejaculation for the last several months.   PMH: Former smoker.   ROS see history of present illness  Objective  Physical Exam Filed Vitals:   04/16/15 1113  BP: 126/74  Pulse: 89  Temp: 97.9 F (36.6 C)    BP Readings from Last 3 Encounters:  04/16/15 126/74  03/18/15 130/78  03/06/15 160/79   Wt Readings from Last 3 Encounters:  04/16/15 256 lb (116.121 kg)  03/18/15 256 lb (116.121 kg)  03/06/15 250 lb (113.399 kg)    Physical Exam  Constitutional: He is well-developed, well-nourished, and in no distress.  HENT:  Head: Normocephalic and atraumatic.  Right Ear: External ear normal.  Left Ear: External ear normal.  Mouth/Throat: Oropharynx is clear and moist. No  oropharyngeal exudate.  Wax in right ear removed with irrigation, normal right TM, normal left TM  Eyes: Conjunctivae are normal. Pupils are equal, round, and reactive to light.  Neck: Neck supple.  Cardiovascular: Normal rate, regular rhythm and normal heart sounds.   Pulmonary/Chest: Effort normal and breath sounds normal.  Genitourinary:  Declined rectal and genitourinary exams  Musculoskeletal: He exhibits no edema.  Lymphadenopathy:    He has no cervical adenopathy.  Neurological: He is alert. Gait normal.  Skin: Skin is warm and dry. He is not diaphoretic.     Assessment/Plan: Please see individual problem list.  Essential hypertension At goal. We'll continue his current medications.  Diabetes ([place]) Uncontrolled. Last A1c was 9. Patient does report his sugars over the last several weeks have come down into the mid 100s. We will increase his Levemir to 17 units in the morning and 16 units at night. Continue his other current diabetic regimen. He will work on his diet as well. If blood sugar is persistently greater than 200 or less than 80 he will let us know. Given return precautions.  BPH (benign prostatic hyperplasia) Patient with persistent nocturia despite treatment with twice daily Flomax. He declined rectal exam and genital exam. Given his uncontrolled symptoms and painful ejaculation we will refer him to urology for further evaluation. I did discuss additional medications for his BPH, though he opted for urology evaluation.  Right ear pain No pain at this time. Benign exam. Patient will continue to monitor.    Orders Placed This  Encounter  Procedures  . Ambulatory referral to Urology    Referral Priority:  Routine    Referral Type:  Consultation    Referral Reason:  Specialty Services Required    Requested Specialty:  Urology    Number of Visits Requested:  1    Meds ordered this encounter  Medications  . ACCU-CHEK COMPACT PLUS test strip    Sig: USE 1 STRIP 3  TIMES DAILY TO TEST    Dispense:  100 each    Refill:  6  . insulin detemir (LEVEMIR) 100 UNIT/ML injection    Sig: Inject 17 units into skin in the morning and 16 units into the skin before bedtime    Dispense:  10 mL    Refill:  2  . ACCU-CHEK FASTCLIX LANCETS MISC    Sig: 1 each by Does not apply route 3 (three) times daily. Check sugar 6 x daily    Dispense:  204 each    Refill:  3    Lancets come in boxes of 102 each. Please dispense 2 boxes. For questions regarding this prescription please call [phone].    Alan Rumps, MD [place]

## 2015-04-17 NOTE — Telephone Encounter (Signed)
Pt states that when he was in the office he forgot to mention that he needed a refill on his xanax, and he was told it would be called in. Do you know anything about this conversation? Please advise, thanks

## 2015-04-17 NOTE — Assessment & Plan Note (Signed)
Uncontrolled. Last A1c was 9. Patient does report his sugars over the last several weeks have come down into the mid 100s. We will increase his Levemir to 17 units in the morning and 16 units at night. Continue his other current diabetic regimen. He will work on his diet as well. If blood sugar is persistently greater than 200 or less than 80 he will let us know. Given return precautions.

## 2015-04-17 NOTE — Telephone Encounter (Signed)
We will print this out and we will refill it. In the future the patient needs to be advised that this medicine should only be refilled during office visits and needs to be discussed at an office visit when he needs refills. Thanks.

## 2015-04-17 NOTE — Assessment & Plan Note (Signed)
Patient with persistent nocturia despite treatment with twice daily Flomax. He declined rectal exam and genital exam. Given his uncontrolled symptoms and painful ejaculation we will refer him to urology for further evaluation. I did discuss additional medications for his BPH, though he opted for urology evaluation.

## 2015-04-18 NOTE — Telephone Encounter (Signed)
Notified ot of Dr. Tharon Aquas comments, pt verbalized understanding.

## 2015-04-29 ENCOUNTER — Telehealth: Payer: Self-pay

## 2015-04-29 NOTE — Telephone Encounter (Signed)
Attempted to call patient again. Was able to leave a message.

## 2015-04-29 NOTE — Telephone Encounter (Signed)
Pt wants to know more about RF procedure. Pt has a few questions and would like a nurse or Kori to call him.

## 2015-04-29 NOTE — Telephone Encounter (Signed)
Attempted to call patient, voice mailbox not set up. 

## 2015-04-30 ENCOUNTER — Ambulatory Visit: Payer: Medicare Other | Attending: Pain Medicine | Admitting: Pain Medicine

## 2015-04-30 ENCOUNTER — Encounter: Payer: Self-pay | Admitting: Pain Medicine

## 2015-04-30 VITALS — BP 121/68 | HR 90 | Temp 97.0°F | Resp 14 | Ht 73.0 in | Wt 250.0 lb

## 2015-04-30 DIAGNOSIS — R55 Syncope and collapse: Secondary | ICD-10-CM | POA: Insufficient documentation

## 2015-04-30 DIAGNOSIS — G8929 Other chronic pain: Secondary | ICD-10-CM | POA: Diagnosis not present

## 2015-04-30 DIAGNOSIS — G545 Neuralgic amyotrophy: Secondary | ICD-10-CM | POA: Diagnosis not present

## 2015-04-30 DIAGNOSIS — E1122 Type 2 diabetes mellitus with diabetic chronic kidney disease: Secondary | ICD-10-CM | POA: Diagnosis not present

## 2015-04-30 DIAGNOSIS — R51 Headache: Secondary | ICD-10-CM | POA: Insufficient documentation

## 2015-04-30 DIAGNOSIS — M47816 Spondylosis without myelopathy or radiculopathy, lumbar region: Secondary | ICD-10-CM | POA: Diagnosis not present

## 2015-04-30 DIAGNOSIS — Z6832 Body mass index (BMI) 32.0-32.9, adult: Secondary | ICD-10-CM | POA: Insufficient documentation

## 2015-04-30 DIAGNOSIS — I129 Hypertensive chronic kidney disease with stage 1 through stage 4 chronic kidney disease, or unspecified chronic kidney disease: Secondary | ICD-10-CM | POA: Diagnosis not present

## 2015-04-30 DIAGNOSIS — Z96641 Presence of right artificial hip joint: Secondary | ICD-10-CM | POA: Insufficient documentation

## 2015-04-30 DIAGNOSIS — K219 Gastro-esophageal reflux disease without esophagitis: Secondary | ICD-10-CM | POA: Diagnosis not present

## 2015-04-30 DIAGNOSIS — Z79891 Long term (current) use of opiate analgesic: Secondary | ICD-10-CM | POA: Diagnosis not present

## 2015-04-30 DIAGNOSIS — M5481 Occipital neuralgia: Secondary | ICD-10-CM | POA: Insufficient documentation

## 2015-04-30 DIAGNOSIS — M549 Dorsalgia, unspecified: Secondary | ICD-10-CM | POA: Diagnosis present

## 2015-04-30 DIAGNOSIS — G629 Polyneuropathy, unspecified: Secondary | ICD-10-CM | POA: Diagnosis not present

## 2015-04-30 DIAGNOSIS — M25511 Pain in right shoulder: Secondary | ICD-10-CM | POA: Insufficient documentation

## 2015-04-30 DIAGNOSIS — M542 Cervicalgia: Secondary | ICD-10-CM | POA: Diagnosis not present

## 2015-04-30 DIAGNOSIS — G8220 Paraplegia, unspecified: Secondary | ICD-10-CM | POA: Diagnosis not present

## 2015-04-30 DIAGNOSIS — G8918 Other acute postprocedural pain: Secondary | ICD-10-CM | POA: Diagnosis not present

## 2015-04-30 DIAGNOSIS — M25512 Pain in left shoulder: Secondary | ICD-10-CM | POA: Insufficient documentation

## 2015-04-30 DIAGNOSIS — M545 Low back pain: Secondary | ICD-10-CM | POA: Diagnosis not present

## 2015-04-30 DIAGNOSIS — N400 Enlarged prostate without lower urinary tract symptoms: Secondary | ICD-10-CM | POA: Insufficient documentation

## 2015-04-30 DIAGNOSIS — E785 Hyperlipidemia, unspecified: Secondary | ICD-10-CM | POA: Insufficient documentation

## 2015-04-30 DIAGNOSIS — E1140 Type 2 diabetes mellitus with diabetic neuropathy, unspecified: Secondary | ICD-10-CM | POA: Diagnosis not present

## 2015-04-30 LAB — GLUCOSE, CAPILLARY: Glucose-Capillary: 200 mg/dL — ABNORMAL HIGH (ref 65–99)

## 2015-04-30 MED ORDER — ATROPINE SULFATE 0.1 MG/ML IJ SOLN
INTRAMUSCULAR | Status: AC
  Administered 2015-04-30: 0.1 mg via INTRAVENOUS
  Filled 2015-04-30: qty 10

## 2015-04-30 MED ORDER — ROPIVACAINE HCL 2 MG/ML IJ SOLN
INTRAMUSCULAR | Status: AC
  Administered 2015-04-30: 11:00:00
  Filled 2015-04-30: qty 10

## 2015-04-30 MED ORDER — TRIAMCINOLONE ACETONIDE 40 MG/ML IJ SUSP: 40.0000 mg | Freq: Once | INTRAMUSCULAR | Status: DC

## 2015-04-30 MED ORDER — MIDAZOLAM HCL 5 MG/5ML IJ SOLN
INTRAMUSCULAR | Status: AC
  Administered 2015-04-30: 3 mg via INTRAVENOUS
  Filled 2015-04-30: qty 5

## 2015-04-30 MED ORDER — TRIAMCINOLONE ACETONIDE 40 MG/ML IJ SUSP
INTRAMUSCULAR | Status: AC
  Administered 2015-04-30: 11:00:00
  Filled 2015-04-30: qty 1

## 2015-04-30 MED ORDER — LIDOCAINE HCL (PF) 1 % IJ SOLN: 10.0000 mL | Freq: Once | INTRAMUSCULAR | Status: DC

## 2015-04-30 MED ORDER — ROPIVACAINE HCL 2 MG/ML IJ SOLN: 9.0000 mL | Freq: Once | INTRAMUSCULAR | Status: DC

## 2015-04-30 MED ORDER — OXYCODONE HCL 5 MG PO TABS: 5.0000 mg | ORAL_TABLET | Freq: Four times a day (QID) | ORAL | Status: DC | PRN

## 2015-04-30 MED ORDER — FENTANYL CITRATE (PF) 100 MCG/2ML IJ SOLN: 100.0000 ug | INTRAMUSCULAR | Status: DC

## 2015-04-30 MED ORDER — LACTATED RINGERS IV SOLN: 1000.0000 mL | INTRAVENOUS | Status: AC

## 2015-04-30 MED ORDER — MIDAZOLAM HCL 5 MG/5ML IJ SOLN: 5.0000 mg | INTRAMUSCULAR | Status: DC

## 2015-04-30 MED ORDER — FENTANYL CITRATE (PF) 100 MCG/2ML IJ SOLN
INTRAMUSCULAR | Status: AC
  Administered 2015-04-30: 100 ug
  Filled 2015-04-30: qty 2

## 2015-04-30 MED ORDER — LIDOCAINE HCL (PF) 1 % IJ SOLN
INTRAMUSCULAR | Status: AC
  Administered 2015-04-30: 12:00:00
  Filled 2015-04-30: qty 5

## 2015-04-30 NOTE — Progress Notes (Signed)
Patient's Name: Alan Schmidt MRN: [id number] DOB: Sep 12, 1945 DOS: 04/30/2015  Primary Reason(s) for Visit: Interventional Pain Management Treatment. CC: Back Pain   Procedure:  Type: Therapeutic Medial Branch Facet Radiofrequency Ablation Region: Lumbar Level: L2, L3, L4, L5, & S1 Medial Branch Level(s) Laterality: Right  Indications: 1. Lumbar spondylosis, unspecified spinal osteoarthritis   2. Lumbar facet syndrome (Bilateral) (R>L)   3. Chronic low back pain (Location of Primary Source of Pain) (Bilateral) (R>L)   4. Acute postoperative pain   5. Vasovagal episode     The patient has failed to respond to conservative therapies including over-the-counter medications, anti-inflammatories, muscle relaxants, membrane stabilizers, opioids, physical therapy, modalities such as heat and ice, as well as more invasive techniques such as nerve blocks. The patient did attained more than 50% relief of the pain from a series of diagnostic injections conducted in separate occasions.  In addition, Alan Schmidt has Polyneuropathy ([place]); Chronic pain; Chronic low back pain (Location of Primary Source of Pain) (Bilateral) (R>L); Lumbar facet syndrome (Bilateral) (R>L); Lumbar spondylosis; Diabetic peripheral neuropathy ([place]); Chronic neck pain (midline over the C7 spinous processes) (L>R); Neurogenic pain; Neuropathic pain; Chronic lower extremity pain (Location of Secondary source of pain) (Bilateral) (R>L); 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) (Right L5 dermatome); History of total hip replacement (Right); Chronic hip pain (Right); Abnormal nerve conduction studies (severe bilateral lower extremity polyneuropathy); Chronic shoulder pain (Bilateral) (R>L); Occipital neuralgia (Left); Cervicogenic headache (Left); and Acute postoperative pain (post-radiofrequency) on his pertinent problem list.  Anesthesia, Analgesia, Anxiolysis:  Type: Moderate (Conscious) Sedation & Local Anesthesia Local  Anesthetic: Lidocaine 1% Route: Intravenous (IV) IV Access: Secured Sedation: Meaningful verbal contact was maintained at all times during the procedure  Indication(s): Analgesia & Anxiolysis  Pre-Procedure Assessment:  Alan Schmidt is a 70 y.o. year old, male patient, seen today for interventional treatment. He has Polyneuropathy ([place]); Paraparesis ([place]); Chronic pain; Chronic low back pain (Location of Primary Source of Pain) (Bilateral) (R>L); Lumbar facet syndrome (Bilateral) (R>L); Lumbar spondylosis; Diabetic peripheral neuropathy ([place]); Long term current use of opiate analgesic; Long term prescription opiate use; Opiate use; Opiate dependence ([place]); Encounter for therapeutic drug level monitoring; Chronic neck pain (midline over the C7 spinous processes) (L>R); Neurogenic pain; Neuropathic pain; Contrast dye allergy; Chronic lower extremity pain (Location of Secondary source of pain) (Bilateral) (R>L); 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) (Right L5 dermatome); History of total hip replacement (Right); Chronic hip pain (Right); Class I Morbid obesity (HCC) (68% higher incidence of chronic low back pain); Essential hypertension; GERD (gastroesophageal reflux disease); Obstructive sleep apnea; Hyperlipidemia; Chronic kidney disease (CKD); Generalized anxiety disorder; Depression; Abnormal nerve conduction studies (severe bilateral lower extremity polyneuropathy); Chronic shoulder pain (Bilateral) (R>L); Occipital neuralgia (Left); Cervicogenic headache (Left); Diabetes ([name]); BPH (benign prostatic hyperplasia); Right ear pain; Acute postoperative pain (post-radiofrequency); and Vasovagal episode on his problem list.. His primarily concern today is the Back Pain   Today's Initial Pain Score: 7/10 Reported level of pain is incompatible with clinical obrservations. This may be secondary to a possible lack of understanding on how the pain scale works. Pain Type: Chronic pain Pain Location:  Back Pain Orientation: Left Pain Descriptors / Indicators:  (excrutiating) Pain Frequency: Intermittent  Post-procedure Pain Score: 0-No pain  Date of Last Visit: 03/06/15 Service Provided on Last Visit: Evaluation  Verification of the correct person, correct site (including marking of site), and correct procedure were performed and confirmed by the patient.  Today's Vitals   04/30/15 1151 04/30/15 1157  04/30/15 1207 04/30/15 1217  BP: 100/67 105/65 120/74 121/68  Pulse: 93 95 96 90  Temp:    97 F (36.1 C)  TempSrc:      Resp: 14 14 14 14   Height:      Weight:      SpO2: 97% 96% 94% 95%  PainSc: 0-No pain   0-No pain  Calculated BMI: Body mass index is 32.99 kg/(m^2). Allergies: He is allergic to contrast media; iodine; and shellfish allergy.. Primary Diagnosis: Lumbar spondylosis, unspecified spinal osteoarthritis [M47.816]  Consent: Secured. Under the influence of no sedatives a written informed consent was obtained, after having provided information on the risks and possible complications. To fulfill our ethical and legal obligations, as recommended by the American Medical Association's Code of Ethics, we have provided information to the patient about our clinical impression; the nature and purpose of the treatment or procedure; the risks, benefits, and possible complications of the intervention; alternatives; the risk(s) and benefit(s) of the alternative treatment(s) or procedure(s); and the risk(s) and benefit(s) of doing nothing. The patient was provided information about the risks and possible complications associated with the procedure. In the case of spinal procedures these may include, but are not limited to, failure to achieve desired goals, infection, bleeding, organ or nerve damage, allergic reactions, paralysis, and death. In addition, the patient was informed that Medicine is not an exact science; therefore, there is also the possibility of unforeseen risks and possible  complications that may result in a catastrophic outcome. The patient indicated having understood very clearly. We have given the patient no guarantees and we have made no promises. Enough time was given to the patient to ask questions, all of which were answered to the patient's satisfaction.  Pre-Procedure Preparation: Safety Precautions: Allergies reviewed. Appropriate site, procedure, and patient were confirmed by following the Joint Commission's Universal Protocol (UP.01.01.01), in the form of a "Time Out". The patient was asked to confirm marked site and procedure, before commencing. The patient was asked about blood thinners, or active infections, both of which were denied. Patient was assessed for positional comfort and all pressure points were checked before starting procedure. Monitoring:  As per clinic protocol. Infection Control Precautions: Sterile technique used. Standard Universal Precautions were taken as recommended by the Department of [facility] for Disease Control and Prevention (CDC). Standard pre-surgical skin prep was conducted. Respiratory hygiene and cough etiquette was practiced. Hand hygiene observed. Safe injection practices and needle disposal techniques followed. SDV (single dose vial) medications used. Medications properly checked for expiration dates and contaminants. Personal protective equipment (PPE) used: Sterile double glove technique. Radiation resistant gloves. Sterile surgical gloves.  Description of Procedure Process:   Time-out: "Time-out" completed before starting procedure, as per protocol. Position: Prone Target Area: For Lumbar Facet blocks, the target is the groove formed by the junction of the transverse process and superior articular process. For the L5 dorsal ramus, the target is the notch between superior articular process and sacral ala. For the S1 dorsal ramus, the target is the superior and lateral edge of the posterior S1 Sacral  foramen. Approach: Paraspinal approach. Area Prepped: Entire Posterior Lumbosacral Region Prepping solution: ChloraPrep (2% chlorhexidine gluconate and 70% isopropyl alcohol) Safety Precautions: Aspiration looking for blood return was conducted prior to all injections. At no point did we inject any substances, as a needle was being advanced. No attempts were made at seeking any paresthesias. Safe injection practices and needle disposal techniques used. Medications properly checked for expiration dates.  SDV (single dose vial) medications used.   Description of the Procedure: Protocol guidelines were followed. The patient was placed in position over the fluoroscopy table. The target area was identified and the area prepped in the usual manner. Skin desensitized using vapocoolant spray. Skin & deeper tissues infiltrated with local anesthetic. Appropriate amount of time allowed to pass for local anesthetics to take effect. Radiofrequency needles were introduced to the area of the medial branch at the junction of the superior articular process and transverse process using fluoroscopy. Using the [employer], sensory stimulation using 50 Hz was used to locate & identify the nerve, making sure that the needle was positioned such that there was no sensory stimulation below 0.3 V or above 0.7 V. Stimulation using 2 Hz was used to evaluate the motor component. Care was taken not to lesion any nerves that demonstrated motor stimulation of the lower extremities at an output of less than 2.5 times that of the sensory threshold, or a maximum of 2.0 V. Once satisfactory placement of the needles was achieved, the above solution was slowly injected after negative aspiration. After waiting for at least 2 minutes, the ablation was performed at 80 degrees C for 60 seconds.The needles were then removed and the area cleansed, making sure to leave some of the prepping solution back to take advantage of its long  term bactericidal properties. EBL: None Materials & Medications Used:  Needle(s) Used: Teflon-Coated Radiofrequency Needles Medications Administered today: We administered ropivacaine (PF) 2 mg/ml (0.2%), triamcinolone acetonide, fentaNYL, midazolam, lidocaine (PF), and atropine.Please see chart orders for dosing details.  Imaging Guidance:   Type of Imaging Technique: Fluoroscopy Guidance (Spinal) Indication(s): Assistance in needle guidance and placement for procedures requiring needle placement in or near specific anatomical locations not easily accessible without such assistance. Exposure Time: Please see nurses notes. Contrast: None required. Fluoroscopic Guidance: I was personally present in the fluoroscopy suite, where the patient was placed in position for the procedure, over the fluoroscopy-compatible table. Fluoroscopy was manipulated, using "Tunnel Vision Technique", to obtain the best possible view of the target area, on the affected side. Parallax error was corrected before commencing the procedure. A "direction-depth-direction" technique was used to introduce the needle under continuous pulsed fluoroscopic guidance. Once the target was reached, antero-posterior, oblique, and lateral fluoroscopic projection views were taken to confirm needle placement in all planes. Permanently recorded images stored by scanning into EMR. Interpretation: Intraoperative imaging interpretation by performing Physician. Adequate needle placement confirmed.  Antibiotic Prophylaxis:  Indication(s): No indications identified. Type:  Antibiotics Given (last 72 hours)    None       Post-operative Assessment:   Complications: Intraoperatively the patient experienced a vasovagal reflex which was promptly treated with 1 mg of IV atropine. The patient responded well without any further complications Disposition: Return to clinic for follow-up evaluation. The patient tolerated the entire procedure well. A  repeat set of vitals were taken after the procedure and the patient was kept under observation following institutional policy, for this procedure. Post-procedural neurological assessment was performed, showing return to baseline, prior to discharge. The patient was discharged home, once institutional criteria were met. The patient was provided with post-procedure discharge instructions, including a section on how to identify potential problems. Should any problems arise concerning this procedure, the patient was given instructions to immediately contact us, at any time, without hesitation. In any case, we plan to contact the patient by telephone for a follow-up status report regarding this interventional procedure. Comments:  No additional relevant information  Medications administered during this visit: We administered ropivacaine (PF) 2 mg/ml (0.2%), triamcinolone acetonide, fentaNYL, midazolam, lidocaine (PF), and atropine.  Prescriptions ordered during this visit: New Prescriptions   OXYCODONE (OXY IR/ROXICODONE) 5 MG IMMEDIATE RELEASE TABLET    Take 1 tablet (5 mg total) by mouth every 6 (six) hours as needed for severe pain (For post-radiofrequency pain only.).    Future Appointments Date Time Provider [place]  05/10/2015 10:00 AM [facility] None  05/22/2015 2:00 PM [name], MD [facility] None  05/29/2015 9:00 AM Milinda Pointer, MD [facility] None  07/11/2015 2:15 PM Trula Slade, DPM [facility]    Primary Care Physician: [name], MD Location: [facility] Note by: Kathlen Brunswick. Dossie Arbour, M.D, DABA, DABAPM, DABPM, DABIPP, FIPP  Disclaimer:  Medicine is not an exact science. The only guarantee in medicine is that nothing is guaranteed. It is important to note that the decision to proceed with this intervention was based on the information collected from the patient. The Data and conclusions were  drawn from the patient's questionnaire, the interview, and the physical examination. Because the information was provided in large part by the patient, it cannot be guaranteed that it has not been purposely or unconsciously manipulated. Every effort has been made to obtain as much relevant data as possible for this evaluation. It is important to note that the conclusions that lead to this procedure are derived in large part from the available data. Always take into account that the treatment will also be dependent on availability of resources and existing treatment guidelines, considered by other Pain Management Practitioners as being common knowledge and practice, at the time of the intervention. For Medico-Legal purposes, it is also important to point out that variation in procedural techniques and pharmacological choices are the acceptable norm. The indications, contraindications, technique, and results of the above procedure should only be interpreted and judged by a Board-Certified Interventional Pain Specialist with extensive familiarity and expertise in the same exact procedure and technique. Attempts at providing opinions without similar or greater experience and expertise than that of the treating physician will be considered as inappropriate and unethical, and shall result in a formal complaint to the state medical board and applicable specialty societies.

## 2015-04-30 NOTE — Telephone Encounter (Signed)
Patient coming in today. Will answer questions at appointment.

## 2015-04-30 NOTE — Assessment & Plan Note (Signed)
This was promptly treated with atropine 1 mg IV with good response.

## 2015-04-30 NOTE — Progress Notes (Signed)
Safety precautions to be maintained throughout the outpatient stay will include: orient to surroundings, keep bed in low position, maintain call bell within reach at all times, provide assistance with transfer out of bed and ambulation.  

## 2015-05-01 ENCOUNTER — Telehealth: Payer: Self-pay | Admitting: *Deleted

## 2015-05-01 NOTE — Telephone Encounter (Signed)
Message left

## 2015-05-05 ENCOUNTER — Other Ambulatory Visit: Payer: Self-pay | Admitting: Family Medicine

## 2015-05-06 NOTE — Telephone Encounter (Signed)
Refill sent to pharmacy.   

## 2015-05-06 NOTE — Telephone Encounter (Signed)
Can we refill this medication? Historical provider. Verified dosage with pharmacy because patient has not taken in 3 weeks and could not remember. HCTZ 25MG  1 tablet PO daily 90 day supply

## 2015-05-08 ENCOUNTER — Telehealth: Payer: Self-pay | Admitting: *Deleted

## 2015-05-08 NOTE — Telephone Encounter (Signed)
Patient called states that he received a bill for $27 and wanted to know if we sent it to Medicare first. Please advise.

## 2015-05-10 ENCOUNTER — Encounter: Payer: Self-pay | Admitting: Urology

## 2015-05-10 ENCOUNTER — Ambulatory Visit: Payer: Self-pay | Admitting: Urology

## 2015-05-11 NOTE — Progress Notes (Signed)
Quick Note:   Normal fasting (NPO x 8 hours) glucose levels are between 65-99 mg/dl, with 2 hour fasting, levels are usually less than 140 mg/dl. Any random blood glucose level greater than 200 mg/dl is considered to be Diabetes.  ______ 

## 2015-05-22 ENCOUNTER — Ambulatory Visit (INDEPENDENT_AMBULATORY_CARE_PROVIDER_SITE_OTHER): Payer: Medicare Other | Admitting: Family Medicine

## 2015-05-22 ENCOUNTER — Encounter: Payer: Self-pay | Admitting: Family Medicine

## 2015-05-22 VITALS — BP 130/66 | HR 82 | Temp 98.4°F | Ht 72.0 in | Wt 253.0 lb

## 2015-05-22 DIAGNOSIS — R945 Abnormal results of liver function studies: Secondary | ICD-10-CM

## 2015-05-22 DIAGNOSIS — M79601 Pain in right arm: Secondary | ICD-10-CM

## 2015-05-22 DIAGNOSIS — E1142 Type 2 diabetes mellitus with diabetic polyneuropathy: Secondary | ICD-10-CM

## 2015-05-22 DIAGNOSIS — J309 Allergic rhinitis, unspecified: Secondary | ICD-10-CM | POA: Insufficient documentation

## 2015-05-22 DIAGNOSIS — M79604 Pain in right leg: Secondary | ICD-10-CM

## 2015-05-22 DIAGNOSIS — M791 Myalgia, unspecified site: Secondary | ICD-10-CM

## 2015-05-22 DIAGNOSIS — Z794 Long term (current) use of insulin: Secondary | ICD-10-CM

## 2015-05-22 DIAGNOSIS — R7989 Other specified abnormal findings of blood chemistry: Secondary | ICD-10-CM

## 2015-05-22 DIAGNOSIS — E785 Hyperlipidemia, unspecified: Secondary | ICD-10-CM

## 2015-05-22 DIAGNOSIS — G8929 Other chronic pain: Secondary | ICD-10-CM

## 2015-05-22 MED ORDER — MOMETASONE FUROATE 50 MCG/ACT NA SUSP: 2.0000 | Freq: Every day | NASAL | Status: DC

## 2015-05-22 NOTE — Assessment & Plan Note (Signed)
Patient is likely tolerating medication well, though this is somewhat muddied by his chronic pain. We'll check a CK. Given mildly elevated liver function tests we'll check hepatitis serology as well. I discussed obtaining an ultrasound though he declined this. He will continue his pravastatin. We'll check a lipid panel as well.

## 2015-05-22 NOTE — Progress Notes (Signed)
Pre visit review using our clinic review tool, if applicable. No additional management support is needed unless otherwise documented below in the visit note. 

## 2015-05-22 NOTE — Assessment & Plan Note (Signed)
Symptoms consistent with allergic rhinitis. Refill Nasonex.

## 2015-05-22 NOTE — Patient Instructions (Addendum)
Nice to see you. I'm glad blood sugars are improved from previously. Please continue your current medication regimen and document your blood sugars on the forms that have been provided. We'll have you return for lab work. I refilled your Nasonex.

## 2015-05-22 NOTE — Progress Notes (Signed)
Patient ID: Alan Schmidt, male   DOB: March 16, 1945, 70 y.o.   MRN: [id number]  Alan Rumps, MD Phone: [phone]  Alan Schmidt is a 70 y.o. male who presents today for f/u.  DIABETES Disease Monitoring: Blood Sugar ranges-states average is 180. States he checks it 7 times a day. He does not have his meter or a log with him. Polyuria/phagia/dipsia- no      ophthalmology-saw 1 month ago Medications: Compliance- patient seems somewhat confused regarding his medications. At first he stated he was only taking Levemir on an as-needed basis though later in the visit he said he is taking 16 units twice a day. States he is taking Victoza and metformin as well. Hypoglycemic symptoms- no  Patient reports no recurrence of his painful ejaculation. Thinks this may been due to a kidney stone. No hematuria. No blood in his semen.  Allergic rhinitis: Patient notes year long allergies for which he uses Nasonex. Notes he has no symptoms if he is taking brand name Nasonex. If he does not take this he gets congestion, rhinorrhea, and sneezing. Notes the generic Nasonex does not help.  HYPERLIPIDEMIA Symptoms Chest pain on exertion:  No   Leg claudication:   No Medications: Compliance- taking pravastatin Right upper quadrant pain- no  Muscle aches- yes, patient has chronic muscle pains that he is followed by pain management for. He notes chronic discomfort in his right leg and reports that it is weaker than the left leg. He requests a physical therapy referral. Of note patient does have mildly elevated LFTs that are actually improved from previously. Has not had hepatitis workup for right upper quadrant ultrasound previously.  PMH: Former smoker   ROS see history of present illness  Objective  Physical Exam Filed Vitals:   05/22/15 1408  BP: 130/66  Pulse: 82  Temp: 98.4 F (36.9 C)    BP Readings from Last 3 Encounters:  05/22/15 130/66  04/30/15 121/68  04/16/15 126/74   Wt  Readings from Last 3 Encounters:  05/22/15 253 lb (114.76 kg)  04/30/15 250 lb (113.399 kg)  04/16/15 256 lb (116.121 kg)    Physical Exam  Constitutional: He is well-developed, well-nourished, and in no distress.  HENT:  Head: Normocephalic and atraumatic.  Right Ear: External ear normal.  Left Ear: External ear normal.  Mouth/Throat: Oropharynx is clear and moist. No oropharyngeal exudate.  Eyes: Conjunctivae are normal. Pupils are equal, round, and reactive to light.  Cardiovascular: Normal rate, regular rhythm and normal heart sounds.   Pulmonary/Chest: Effort normal and breath sounds normal.  Musculoskeletal: He exhibits no edema.  Neurological: He is alert.  5 out of 5 strength bilateral quads, hamstrings, plantar flexion, and dorsiflexion, sensation light touch intact bilateral extremities, 2+ patellar reflexes  Skin: Skin is warm and dry. He is not diaphoretic.     Assessment/Plan: Please see individual problem list.  Diabetes ([place]) Blood sugars not at goal. Patient has no log in the office today. Last A1c was not at goal. Discussed medications at length. Advised that he needs to be taking the Levemir every day as directed. Continue other medications. He will keep a blood pressure log and he was provided with forms to do this. He will call in 2 weeks to let us know what his blood sugars have been running. Discussed diet and exercise as well. Discussed low-impact exercises though he declined this. Discussed referral to dietitian and diabetic education and he declined these as well. He requested physical therapy  referral to work on his leg strength and states this will be enough exercise for him.  Hyperlipidemia Patient is likely tolerating medication well, though this is somewhat muddied by his chronic pain. We'll check a CK. Given mildly elevated liver function tests we'll check hepatitis serology as well. I discussed obtaining an ultrasound though he declined this. He will  continue his pravastatin. We'll check a lipid panel as well.  Allergic rhinitis Symptoms consistent with allergic rhinitis. Refill Nasonex.  Chronic lower extremity pain (Location of Secondary source of pain) (Bilateral) (R>L) Patient with chronic pain in his lower extremities followed by pain management. We will refer to physical therapy to see if this will help with his pain and his subjective weakness. He is neurologically intact in his lower extremities.   patient will monitor for recurrence of painful ejaculation. If recurs will refer to urology.  Orders Placed This Encounter  Procedures  . CK (Creatine Kinase)    Standing Status: Future     Number of Occurrences:      Standing Expiration Date: 05/21/2016  . Hepatitis C Antibody    Standing Status: Future     Number of Occurrences:      Standing Expiration Date: 05/21/2016  . Hepatitis B Core Antibody, total    Standing Status: Future     Number of Occurrences:      Standing Expiration Date: 05/21/2016  . Hepatitis B Surface AntiBODY    Standing Status: Future     Number of Occurrences:      Standing Expiration Date: 05/21/2016  . Hepatitis B Surface AntiGEN    Standing Status: Future     Number of Occurrences:      Standing Expiration Date: 05/21/2016  . Lipid Profile    Standing Status: Future     Number of Occurrences:      Standing Expiration Date: 05/21/2016  . Ambulatory referral to Physical Therapy    Referral Priority:  Routine    Referral Type:  Physical Medicine    Referral Reason:  Specialty Services Required    Requested Specialty:  Physical Therapy    Number of Visits Requested:  1    Meds ordered this encounter  Medications  . mometasone (NASONEX) 50 MCG/ACT nasal spray    Sig: Place 2 sprays into the nose daily.    Dispense:  17 g    Refill:  [place], MD [place]

## 2015-05-22 NOTE — Assessment & Plan Note (Addendum)
Blood sugars not at goal. Patient has no log in the office today. Last A1c was not at goal. Discussed medications at length. Advised that he needs to be taking the Levemir every day as directed. Continue other medications. He will keep a blood pressure log and he was provided with forms to do this. He will call in 2 weeks to let us know what his blood sugars have been running. Discussed diet and exercise as well. Discussed low-impact exercises though he declined this. Discussed referral to dietitian and diabetic education and he declined these as well. He requested physical therapy referral to work on his leg strength and states this will be enough exercise for him.

## 2015-05-22 NOTE — Assessment & Plan Note (Signed)
Patient with chronic pain in his lower extremities followed by pain management. We will refer to physical therapy to see if this will help with his pain and his subjective weakness. He is neurologically intact in his lower extremities.

## 2015-05-29 ENCOUNTER — Encounter: Payer: Self-pay | Admitting: Pain Medicine

## 2015-05-29 ENCOUNTER — Ambulatory Visit: Payer: Medicare Other | Attending: Pain Medicine | Admitting: Pain Medicine

## 2015-05-29 VITALS — BP 159/91 | HR 85 | Temp 98.4°F | Resp 18 | Ht 73.0 in | Wt 250.0 lb

## 2015-05-29 DIAGNOSIS — M25512 Pain in left shoulder: Secondary | ICD-10-CM | POA: Diagnosis not present

## 2015-05-29 DIAGNOSIS — J309 Allergic rhinitis, unspecified: Secondary | ICD-10-CM | POA: Diagnosis not present

## 2015-05-29 DIAGNOSIS — G43909 Migraine, unspecified, not intractable, without status migrainosus: Secondary | ICD-10-CM | POA: Insufficient documentation

## 2015-05-29 DIAGNOSIS — E785 Hyperlipidemia, unspecified: Secondary | ICD-10-CM | POA: Insufficient documentation

## 2015-05-29 DIAGNOSIS — M47816 Spondylosis without myelopathy or radiculopathy, lumbar region: Secondary | ICD-10-CM

## 2015-05-29 DIAGNOSIS — R51 Headache: Secondary | ICD-10-CM | POA: Diagnosis not present

## 2015-05-29 DIAGNOSIS — M542 Cervicalgia: Secondary | ICD-10-CM | POA: Insufficient documentation

## 2015-05-29 DIAGNOSIS — Z79891 Long term (current) use of opiate analgesic: Secondary | ICD-10-CM

## 2015-05-29 DIAGNOSIS — H9201 Otalgia, right ear: Secondary | ICD-10-CM | POA: Insufficient documentation

## 2015-05-29 DIAGNOSIS — M5481 Occipital neuralgia: Secondary | ICD-10-CM | POA: Insufficient documentation

## 2015-05-29 DIAGNOSIS — Z5181 Encounter for therapeutic drug level monitoring: Secondary | ICD-10-CM | POA: Diagnosis not present

## 2015-05-29 DIAGNOSIS — G8918 Other acute postprocedural pain: Secondary | ICD-10-CM | POA: Diagnosis not present

## 2015-05-29 DIAGNOSIS — M25511 Pain in right shoulder: Secondary | ICD-10-CM | POA: Insufficient documentation

## 2015-05-29 DIAGNOSIS — I129 Hypertensive chronic kidney disease with stage 1 through stage 4 chronic kidney disease, or unspecified chronic kidney disease: Secondary | ICD-10-CM | POA: Insufficient documentation

## 2015-05-29 DIAGNOSIS — E1142 Type 2 diabetes mellitus with diabetic polyneuropathy: Secondary | ICD-10-CM | POA: Diagnosis not present

## 2015-05-29 DIAGNOSIS — N400 Enlarged prostate without lower urinary tract symptoms: Secondary | ICD-10-CM | POA: Diagnosis not present

## 2015-05-29 DIAGNOSIS — M79606 Pain in leg, unspecified: Secondary | ICD-10-CM | POA: Diagnosis present

## 2015-05-29 DIAGNOSIS — F419 Anxiety disorder, unspecified: Secondary | ICD-10-CM | POA: Diagnosis not present

## 2015-05-29 DIAGNOSIS — M25559 Pain in unspecified hip: Secondary | ICD-10-CM | POA: Diagnosis present

## 2015-05-29 DIAGNOSIS — M792 Neuralgia and neuritis, unspecified: Secondary | ICD-10-CM

## 2015-05-29 DIAGNOSIS — Z96641 Presence of right artificial hip joint: Secondary | ICD-10-CM | POA: Diagnosis not present

## 2015-05-29 DIAGNOSIS — M545 Low back pain: Secondary | ICD-10-CM | POA: Diagnosis not present

## 2015-05-29 DIAGNOSIS — F1190 Opioid use, unspecified, uncomplicated: Secondary | ICD-10-CM

## 2015-05-29 DIAGNOSIS — Z87891 Personal history of nicotine dependence: Secondary | ICD-10-CM | POA: Insufficient documentation

## 2015-05-29 DIAGNOSIS — G8929 Other chronic pain: Secondary | ICD-10-CM

## 2015-05-29 DIAGNOSIS — Z87442 Personal history of urinary calculi: Secondary | ICD-10-CM | POA: Diagnosis not present

## 2015-05-29 DIAGNOSIS — M47896 Other spondylosis, lumbar region: Secondary | ICD-10-CM | POA: Diagnosis not present

## 2015-05-29 DIAGNOSIS — G8220 Paraplegia, unspecified: Secondary | ICD-10-CM | POA: Diagnosis not present

## 2015-05-29 MED ORDER — OXYCODONE HCL 10 MG PO TABS: 10.0000 mg | ORAL_TABLET | Freq: Four times a day (QID) | ORAL | Status: DC | PRN

## 2015-05-29 MED ORDER — NALOXONE HCL 2 MG/2ML IJ SOSY: PREFILLED_SYRINGE | INTRAMUSCULAR | Status: DC

## 2015-05-29 MED ORDER — PREGABALIN 75 MG PO CAPS: 75.0000 mg | ORAL_CAPSULE | Freq: Two times a day (BID) | ORAL | Status: DC

## 2015-05-29 NOTE — Patient Instructions (Addendum)
Radiofrequency Lesioning Radiofrequency lesioning is a procedure that is performed to relieve pain. The procedure is often used for back, neck, or arm pain. Radiofrequency lesioning involves the use of a machine that creates radio waves to make heat. During the procedure, the heat is applied to the nerve that carries the pain signal. The heat damages the nerve and interferes with the pain signal. Pain relief usually lasts for 6 months to 1 year. LET [facility] CARE PROVIDER KNOW ABOUT:  Any allergies you have.  All medicines you are taking, including vitamins, herbs, eye drops, creams, and over-the-counter medicines.  Previous problems you or members of your family have had with the use of anesthetics.  Any blood disorders you have.  Previous surgeries you have had.  Any medical conditions you have.  Whether you are pregnant or may be pregnant. RISKS AND COMPLICATIONS Generally, this is a safe procedure. However, problems may occur, including:  Pain or soreness at the injection site.  Infection at the injection site.  Damage to nerves or blood vessels. BEFORE THE PROCEDURE  Ask your health care provider about:  Changing or stopping your regular medicines. This is especially important if you are taking diabetes medicines or blood thinners.  Taking medicines such as aspirin and ibuprofen. These medicines can thin your blood. Do not take these medicines before your procedure if your health care provider instructs you not to.  Follow instructions from your health care provider about eating or drinking restrictions.  Plan to have someone take you home after the procedure.  If you go home right after the procedure, plan to have someone with you for 24 hours. PROCEDURE  You will be given one or more of the following:  A medicine to help you relax (sedative).  A medicine to numb the area (local anesthetic).  You will be awake during the procedure. You will need to be able to  talk with the health care provider during the procedure.  With the help of a type of X-ray (fluoroscopy), the health care provider will insert a radiofrequency needle into the area to be treated.  Next, a wire that carries the radio waves (electrode) will be put through the radiofrequency needle. An electrical pulse will be sent through the electrode to verify the correct nerve. You will feel a tingling sensation, and you may have muscle twitching.  Then, the tissue that is around the needle tip will be heated by an electric current that is passed using the radiofrequency machine. This will numb the nerves.  A bandage (dressing) will be put on the insertion area after the procedure is done. The procedure may vary among health care providers and hospitals. AFTER THE PROCEDURE  Your blood pressure, heart rate, breathing rate, and blood oxygen level will be monitored often until the medicines you were given have worn off.  Return to your normal activities as directed by your health care provider.   This information is not intended to replace advice given to you by your health care provider. Make sure you discuss any questions you have with your health care provider.   Document Released: 09/17/2010 Document Revised: 10/10/2014 Document Reviewed: 02/26/2014 Elsevier Interactive Patient Education 2016 [employer]. Syncope, commonly known as fainting, is a temporary loss of consciousness. It occurs when the blood flow to the brain is reduced. Vasovagal syncope (also called neurocardiogenic syncope) is a fainting spell in which the blood flow to the brain is reduced because of a sudden drop in heart rate  and blood pressure. Vasovagal syncope occurs when the brain and the cardiovascular system (blood vessels) do not adequately communicate and respond to each other. This is the most common cause of fainting. It often occurs in response to fear or some other type of emotional or physical stress. The body  has a reaction in which the heart starts beating too slowly or the blood vessels expand, reducing blood pressure. This type of fainting spell is generally considered harmless. However, injuries can occur if a person takes a sudden fall during a fainting spell.  CAUSES  Vasovagal syncope occurs when a person's blood pressure and heart rate decrease suddenly, usually in response to a trigger. Many things and situations can trigger an episode. Some of these include:   Pain.   Fear.   The sight of blood or medical procedures, such as blood being drawn from a vein.   Common activities, such as coughing, swallowing, stretching, or going to the bathroom.   Emotional stress.   Prolonged standing, especially in a warm environment.   Lack of sleep or rest.   Prolonged lack of food.   Prolonged lack of fluids.   Recent illness.  The use of certain drugs that affect blood pressure, such as cocaine, alcohol, marijuana, inhalants, and opiates.  SYMPTOMS  Before the fainting episode, you may:   Feel dizzy or light headed.   Become pale.  Sense that you are going to faint.   Feel like the room is spinning.   Have tunnel vision, only seeing directly in front of you.   Feel sick to your stomach (nauseous).   See spots or slowly lose vision.   Hear ringing in your ears.   Have a headache.   Feel warm and sweaty.   Feel a sensation of pins and needles. During the fainting spell, you will generally be unconscious for no longer than a couple minutes before waking up and returning to normal. If you get up too quickly before your body can recover, you may faint again. Some twitching or jerky movements may occur during the fainting spell.  DIAGNOSIS  Your health care provider will ask about your symptoms, take a medical history, and perform a physical exam. Various tests may be done to rule out other causes of fainting. These may include blood tests and tests to check the  heart, such as electrocardiography, echocardiography, and possibly an electrophysiology study. When other causes have been ruled out, a test may be done to check the body's response to changes in position (tilt table test). TREATMENT  Most cases of vasovagal syncope do not require treatment. Your health care provider may recommend ways to avoid fainting triggers and may provide home strategies for preventing fainting. If you must be exposed to a possible trigger, you can drink additional fluids to help reduce your chances of having an episode of vasovagal syncope. If you have warning signs of an oncoming episode, you can respond by positioning yourself favorably (lying down). If your fainting spells continue, you may be given medicines to prevent fainting. Some medicines may help make you more resistant to repeated episodes of vasovagal syncope. Special exercises or compression stockings may be recommended. In rare cases, the surgical placement of a pacemaker is considered. HOME CARE INSTRUCTIONS   Learn to identify the warning signs of vasovagal syncope.   Sit or lie down at the first warning sign of a fainting spell. If sitting, put your head down between your legs. If you lie down, swing  your legs up in the air to increase blood flow to the brain.   Avoid hot tubs and saunas.  Avoid prolonged standing.  Drink enough fluids to keep your urine clear or pale yellow. Avoid caffeine.  Increase salt in your diet as directed by your health care provider.   If you have to stand for a long time, perform movements such as:   Crossing your legs.   Flexing and stretching your leg muscles.   Squatting.   Moving your legs.   Bending over.   Only take over-the-counter or prescription medicines as directed by your health care provider. Do not suddenly stop any medicines without asking your health care provider first. [place] IF:   Your fainting spells continue or happen more  frequently in spite of treatment.   You lose consciousness for more than a couple minutes.  You have fainting spells during or after exercising or after being startled.   You have new symptoms that occur with the fainting spells, such as:   Shortness of breath.  Chest pain.   Irregular heartbeat.   You have episodes of twitching or jerky movements that last longer than a few seconds.  You have episodes of twitching or jerky movements without obvious fainting. SEEK IMMEDIATE MEDICAL CARE IF:   You have injuries or bleeding after a fainting spell.   You have episodes of twitching or jerky movements that last longer than 5 minutes.   You have more than one spell of twitching or jerky movements before returning to consciousness after fainting.   This information is not intended to replace advice given to you by your health care provider. Make sure you discuss any questions you have with your health care provider.   Document Released: 01/06/2012 Document Revised: 06/05/2014 Document Reviewed: 01/06/2012 Elsevier Interactive Patient Education 2016 [place]. Radiofrequency Lesioning Radiofrequency lesioning is a procedure that is performed to relieve pain. The procedure is often used for back, neck, or arm pain. Radiofrequency lesioning involves the use of a machine that creates radio waves to make heat. During the procedure, the heat is applied to the nerve that carries the pain signal. The heat damages the nerve and interferes with the pain signal. Pain relief usually lasts for 6 months to 1 year. LET [facility] CARE PROVIDER KNOW ABOUT:  Any allergies you have.  All medicines you are taking, including vitamins, herbs, eye drops, creams, and over-the-counter medicines.  Previous problems you or members of your family have had with the use of anesthetics.  Any blood disorders you have.  Previous surgeries you have had.  Any medical conditions you have.  Whether you  are pregnant or may be pregnant. RISKS AND COMPLICATIONS Generally, this is a safe procedure. However, problems may occur, including:  Pain or soreness at the injection site.  Infection at the injection site.  Damage to nerves or blood vessels. BEFORE THE PROCEDURE  Ask your health care provider about:  Changing or stopping your regular medicines. This is especially important if you are taking diabetes medicines or blood thinners.  Taking medicines such as aspirin and ibuprofen. These medicines can thin your blood. Do not take these medicines before your procedure if your health care provider instructs you not to.  Follow instructions from your health care provider about eating or drinking restrictions.  Plan to have someone take you home after the procedure.  If you go home right after the procedure, plan to have someone with you for 24  hours. PROCEDURE  You will be given one or more of the following:  A medicine to help you relax (sedative).  A medicine to numb the area (local anesthetic).  You will be awake during the procedure. You will need to be able to talk with the health care provider during the procedure.  With the help of a type of X-ray (fluoroscopy), the health care provider will insert a radiofrequency needle into the area to be treated.  Next, a wire that carries the radio waves (electrode) will be put through the radiofrequency needle. An electrical pulse will be sent through the electrode to verify the correct nerve. You will feel a tingling sensation, and you may have muscle twitching.  Then, the tissue that is around the needle tip will be heated by an electric current that is passed using the radiofrequency machine. This will numb the nerves.  A bandage (dressing) will be put on the insertion area after the procedure is done. The procedure may vary among health care providers and hospitals. AFTER THE PROCEDURE  Your blood pressure, heart rate, breathing  rate, and blood oxygen level will be monitored often until the medicines you were given have worn off.  Return to your normal activities as directed by your health care provider.   This information is not intended to replace advice given to you by your health care provider. Make sure you discuss any questions you have with your health care provider.   Document Released: 09/17/2010 Document Revised: 10/10/2014 Document Reviewed: 02/26/2014 Elsevier Interactive Patient Education [employer].

## 2015-05-29 NOTE — Progress Notes (Signed)
Patient's Name: Alan Schmidt  Patient type: Established  MRN: [id number]  Service setting: Ambulatory outpatient  DOB: 30-Apr-1945  Location: [facility]  DOS: 05/29/2015  Primary Care Physician: [name], MD  Note by: Kathlen Brunswick. Dossie Arbour, M.D, DABA, DABAPM, DABPM, DABIPP, FIPP  Referring Physician: Cletis Athens, MD  Specialty: Board-Certified Interventional Pain Management     Primary Reason(s) for Visit: Encounter for prescription drug management (Level of risk: moderate) CC: Leg Pain and Hip Pain   HPI  Alan Schmidt is a 70 y.o. year old, male patient, who returns today as an established patient. He has Polyneuropathy ([place]); Paraparesis ([place]); Chronic pain; Chronic low back pain (Location of Primary Source of Pain) (Bilateral) (R>L); Lumbar facet syndrome (Bilateral) (R>L); Lumbar spondylosis; Diabetic peripheral neuropathy (Alan Schmidt); Long term current use of opiate analgesic; Long term prescription opiate use; Opiate use (60 MME/Day); Opiate dependence ([place]); Encounter for therapeutic drug level monitoring; Chronic neck pain (midline over the C7 spinous processes) (L>R); Neurogenic pain; Neuropathic pain; Contrast dye allergy; Chronic lower extremity pain (Location of Secondary source of pain) (Bilateral) (R>L); 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) (Right L5 dermatome); History of total hip replacement (Right); Chronic hip pain (Right); Class I Morbid obesity (HCC) (68% higher incidence of chronic low back pain); Essential hypertension; GERD (gastroesophageal reflux disease); Obstructive sleep apnea; Hyperlipidemia; Chronic kidney disease (CKD); Generalized anxiety disorder; Depression; Abnormal nerve conduction studies (severe bilateral lower extremity polyneuropathy); Chronic shoulder pain (Bilateral) (R>L); Occipital neuralgia (Left); Cervicogenic headache (Left); Diabetes (Alan Schmidt); BPH (benign prostatic hyperplasia); Right ear pain; Acute postoperative  pain (post-radiofrequency); Vasovagal episode; and Allergic rhinitis on his problem list.. His primarily concern today is the Leg Pain and Hip Pain   Pain Assessment: Self-Reported Pain Score: 5  Reported level is compatible with observation Pain Type: Chronic pain Pain Location: Leg Pain Orientation: Right, Left, Upper, Posterior Pain Descriptors / Indicators: Aching, Dull Pain Frequency: Intermittent  The patient returns to the clinics today for pharmacological management of his chronic pain. In addition, he comes in for a pulsed procedure evaluation. On his last visit on 05/01/2015 he had a right sided lumbar facet radiofrequency ablation under fluoroscopic guidance and IV sedation. He's pending to have the left side done.  Date of Last Visit: 05/01/15 Service Provided on Last Visit: Procedure  Controlled Substance Pharmacotherapy Assessment & REMS (Risk Evaluation and Mitigation Strategy)  Analgesic: Oxycodone IR 10 mg every 6 hours (40 mg/day) Pill Count: The patient did not bring his medications to this appointment. Today he has been given a final warning with regards to this. MME/day: 60 mg/day.  Pharmacokinetics: Onset of action (Liberation/Absorption): Within expected pharmacological parameters Time to Peak effect (Distribution): Timing and results are as within normal expected parameters Duration of action (Metabolism/Excretion): Within normal limits for medication Pharmacodynamics: Analgesic Effect: More than 50% Activity Facilitation: Medication(s) allow patient to sit, stand, walk, and do the basic ADLs Perceived Effectiveness: Described as relatively effective, allowing for increase in activities of daily living (ADL) Side-effects or Adverse reactions: None reported Monitoring: [place] PMP: Online review of the past 84-month period conducted. Compliant with practice rules and regulations UDS Results/interpretation: Last UDS was done on 03/06/2015 a came back within normal  limits except for the fact that there was no apraxia Levemir and that was reported. This is actually okay since the patient was informed about the CDC guidelines. Medication Assessment Form: Reviewed. Patient indicates being compliant with therapy Treatment compliance: Compliant Risk Assessment: Aberrant Behavior: None observed today  Substance Use Disorder (SUD) Risk Level: Low Risk of opioid abuse or dependence: 0.7-3.0% with doses ? 36 MME/day and 6.1-26% with doses ? 120 MME/day. Opioid Risk Tool (ORT) Score:  0 Low Risk for SUD (Score <3) Depression Scale Score: PHQ-2: PHQ-2 Total Score: 0 No depression (0) PHQ-9: PHQ-9 Total Score: 0 No depression (0-4)  Pharmacologic Plan: No change in therapy, at this time  Post-Procedure Assessment  Procedure done on last visit: Right-sided lumbar facet radiofrequency ablation under fluoroscopic guidance and IV sedation. Side-effects or Adverse reactions: None reported Sedation: Sedation given  Results: Ultra-Short Term Relief (First 1 hour after procedure): 75 %  Analgesia during this period is likely to be Local Anesthetic and/or IV Sedative (Analgesic/Anxiolitic) related Short Term Relief (Initial 4-6 hrs after procedure): 75 % Complete relief confirms area to be the source of pain Long Term Relief : 50 % Long-term benefit would suggest an inflammatory etiology to the pain   Current Relief (Now): 100% of the really bad low back pain on the right side. He still has some pain on the left.  Long-term benefit could signal adequate neurolysis Interpretation of Results: Based on this result were encouraged to go ahead and complete the left side as planned.  Laboratory Chemistry  Inflammation Markers Lab Results  Component Value Date   ESRSEDRATE 33* 03/06/2015   CRP 0.5 03/06/2015    Renal Function Lab Results  Component Value Date   BUN 12 04/09/2015   CREATININE 1.21 04/09/2015   GFRAA >60 03/06/2015   GFRNONAA >60 03/06/2015     Hepatic Function Lab Results  Component Value Date   AST 39* 04/09/2015   ALT 52 04/09/2015   ALBUMIN 4.2 04/09/2015    Electrolytes Lab Results  Component Value Date   NA 134* 04/09/2015   K 4.7 04/09/2015   CL 95* 04/09/2015   CALCIUM 9.7 04/09/2015   MG 1.8 03/06/2015    Pain Modulating Vitamins No results found for: [place], [id number], [id number], [id number], VITAMINB12  Coagulation Parameters No results found for: INR, LABPROT  Note: I personally reviewed the above data. Results shared with patient.  Meds  The patient has a current medication list which includes the following prescription(s): accu-chek compact plus, accu-chek fastclix lancets, alprazolam, bd pen needle nano u/f, carvedilol, vitamin d3, desoximetasone, fluoxetine, hydrochlorothiazide, hydroxyzine, insulin aspart protamine- aspart, insulin detemir, magnesium oxide, metformin, mometasone, oxycodone hcl, oxycodone hcl, oxycodone hcl, pravastatin, pregabalin, tamsulosin, valsartan, victoza, vitamin d (ergocalciferol), and naloxone.  Current Outpatient Prescriptions on File Prior to Visit  Medication Sig  . ACCU-CHEK COMPACT PLUS test strip USE 1 STRIP 3 TIMES DAILY TO TEST  . ACCU-CHEK FASTCLIX LANCETS MISC 1 each by Does not apply route 3 (three) times daily. Check sugar 6 x daily  . ALPRAZolam (XANAX) 0.5 MG tablet Take 1 tablet (0.5 mg total) by mouth 2 (two) times daily as needed for anxiety.  . BD PEN NEEDLE NANO U/F 32G X 4 MM MISC USE EVERY DAY  . carvedilol (COREG) 25 MG tablet Take 25 mg by mouth 2 (two) times daily.  . Cholecalciferol (VITAMIN D3) 2000 units capsule Take 2,000 Units by mouth daily.  Marland Kitchen desoximetasone (TOPICORT) 0.25 % cream APPLY CREAM TO AFFECTED AREA TWO TIMES DAILY  . FLUoxetine (PROZAC) 40 MG capsule TAKE ONE CAPSULE BY MOUTH TWO TIMES DAILY  . hydrochlorothiazide (HYDRODIURIL) 25 MG tablet TAKE 1 TABLET BY MOUTH EVERY DAY  . hydrOXYzine (ATARAX/VISTARIL) 50 MG tablet TAKE 1  TABLET BY MOUTH FOUR TIMES A  DAY AS NEEDED  . insulin aspart protamine- aspart (NOVOLOG MIX 70/30) (70-30) 100 UNIT/ML injection Inject into the skin. Sliding scale  . insulin detemir (LEVEMIR) 100 UNIT/ML injection Inject 17 units into skin in the morning and 16 units into the skin before bedtime  . magnesium oxide (MAG-OX) 400 (241.3 Mg) MG tablet Take 1 tablet by mouth 2 (two) times daily.  . metFORMIN (GLUCOPHAGE) 1000 MG tablet Take 1,000 mg by mouth 2 (two) times daily with a meal.  . mometasone (NASONEX) 50 MCG/ACT nasal spray Place 2 sprays into the nose daily.  . pravastatin (PRAVACHOL) 40 MG tablet Take 40 mg by mouth daily.  . tamsulosin (FLOMAX) 0.4 MG CAPS capsule Take 0.4 mg by mouth 2 (two) times daily.  . valsartan (DIOVAN) 320 MG tablet Take 320 mg by mouth daily.  Marland Kitchen VICTOZA 18 MG/3ML SOPN INJECT 18 UNITS UNDER THE SKIN ONCE DAILY  . Vitamin D, Ergocalciferol, (DRISDOL) 50000 units CAPS capsule TAKE 1 CAPSULE BY MOUTH 2 TIMES A WEEK   No current facility-administered medications on file prior to visit.    ROS  Constitutional: Afebrile, no chills, well hydrated and well nourished Gastrointestinal: No upper or lower GI bleeding, no nausea, no vomiting and no acute GI distress Musculoskeletal: No acute joint swelling or redness, no acute loss of range of motion and no acute onset weakness Neurological: Denies any acute onset apraxia, no episodes of paralysis, no acute loss of coordination, no acute loss of consciousness and no acute onset aphasia, dysarthria, agnosia, or amnesia  Allergies  Mr. Jaquish is allergic to contrast media; iodine; and shellfish allergy.  [place]  Medical:  Mr. Mcmahen  has a past medical history of Anxiety; Kidney stones; Chronic lumbar pain; Hyperlipidemia; Hypertension; Depression; Migraines; and Chronic hip pain (Right) (12/05/2014). Family: family history includes Cancer in his mother; Heart disease in his father; Stroke in his father. Surgical:   has past surgical history that includes Total hip arthroplasty; Tonsillectomy; and right hip surgery. Tobacco:  reports that he has quit smoking. He does not have any smokeless tobacco history on file. Alcohol:  reports that he does not drink alcohol. Drug:  reports that he does not use illicit drugs.  Physical Examination  Constitutional Vitals: Blood pressure 159/91, pulse 85, temperature 98.4 F (36.9 C), temperature source Oral, resp. rate 18, height 6\' 1"  (1.854 m), weight 250 lb (113.399 kg), SpO2 99 %. Calculated BMI: Body mass index is 32.99 kg/(m^2).       General appearance: Alert, cooperative, oriented x 3, in no acute distress, well nourished, well developed, well hydrated Eyes: PERLA Respiratory: No evidence respiratory distress, no audible rales or ronchi and no use of accessory muscles of respiration Psych: Alert, oriented to person, oriented to place and oriented to time  Cervical Spine Exam  Inspection: Normal anatomy, no anomalies observed Cervical Lordosis: Normal Alignment: Symetrical Functional ROM: Within functional limits (WFL) AROM: WFL Sensory: No sensory anomalies reported or detected  Upper Extremity Exam    Right  Left  Inspection: No gross anomalies detected  Inspection: No gross anomalies detected  Functional ROM: Adequate  Functional ROM: Adequate  AROM: Adequate  AROM: Adequate  Sensory: No sensory anomalies reported or detected  Sensory: No sensory anomalies reported or detected  Motor: Unremarkable  Motor: Unremarkable  Vascular: Normal skin color, temperature, and hair growth. No peripheral edema or cyanosis  Vascular: Normal skin color, temperature, and hair growth. No peripheral edema or cyanosis   Thoracic Spine  Inspection:  No gross anomalies detected Alignment: Symetrical Functional ROM: Within functional limits [facility]) AROM: Adequate Palpation: WNL  Lumbar Spine  Inspection: No gross anomalies detected  Alignment: Symetrical  Functional  ROM: Within functional limits [facility])  AROM: Decreased but improved.  Sensory: No sensory anomalies reported or detected  Palpation: Tender but improved.  Provocative Tests: Lumbar Hyperextension and rotation test: Positive on the left side for lumbar facet pain.  Patrick's Maneuver: deferred   Gait Assessment  Gait: WNL  Lower Extremities    Right  Left  Inspection: No gross anomalies detected  Inspection: No gross anomalies detected  Functional ROM: Within functional limits [facility])  Functional ROM: Within functional limits (WFL)  AROM: Adequate  AROM: Adequate  Sensory: No sensory anomalies reported or detected  Sensory: No sensory anomalies reported or detected  Motor: Unremarkable  Motor: Unremarkable  Toe walk (S1): WNL  Toe walk (S1): WNL  Heal walk (L5): WNL  Heal walk (L5): WNL   Assessment & Plan  Primary Diagnosis & Pertinent Problem List: The primary encounter diagnosis was Chronic pain. Diagnoses of Encounter for therapeutic drug level monitoring, Long term current use of opiate analgesic, Lumbar facet syndrome (Bilateral) (R>L), Opiate use (60 MME/Day), Neuropathic pain, and Neurogenic pain were also pertinent to this visit.  Visit Diagnosis: 1. Chronic pain   2. Encounter for therapeutic drug level monitoring   3. Long term current use of opiate analgesic   4. Lumbar facet syndrome (Bilateral) (R>L)   5. Opiate use (60 MME/Day)   6. Neuropathic pain   7. Neurogenic pain     Problems updated and reviewed during this visit: Problem  Opiate use (60 MME/Day)   Oxycodone IR 10 mg every 6 hours (40 mg/day)     Problem-specific Plan(s): No problem-specific assessment & plan notes found for this encounter.  No new assessment & plan notes have been filed under this hospital service since the last note was generated. Service: Pain Management   Plan of Care   Problem List Items Addressed This Visit      High   Chronic pain - Primary (Chronic)   Relevant  Medications   Oxycodone HCl 10 MG TABS   Oxycodone HCl 10 MG TABS   Oxycodone HCl 10 MG TABS   pregabalin (LYRICA) 75 MG capsule   Lumbar facet syndrome (Bilateral) (R>L) (Chronic)   Relevant Medications   Oxycodone HCl 10 MG TABS   Oxycodone HCl 10 MG TABS   Oxycodone HCl 10 MG TABS   Other Relevant Orders   Radiofrequency,Lumbar   Neurogenic pain (Chronic)   Relevant Medications   pregabalin (LYRICA) 75 MG capsule   Neuropathic pain (Chronic)   Relevant Medications   pregabalin (LYRICA) 75 MG capsule     Medium   Encounter for therapeutic drug level monitoring   Long term current use of opiate analgesic (Chronic)   Opiate use (60 MME/Day) (Chronic)   Relevant Medications   naloxone (NARCAN) 2 MG/2ML injection       Pharmacotherapy (Medications Ordered): Meds ordered this encounter  Medications  . Oxycodone HCl 10 MG TABS    Sig: Take 1 tablet (10 mg total) by mouth every 6 (six) hours as needed.    Dispense:  120 tablet    Refill:  0    Do not place this medication, or any other prescription from our practice, on "Automatic Refill". Patient may have prescription filled one day early if pharmacy is closed on scheduled refill date. Do not fill until:  06/04/15 To last until: 07/04/15  . Oxycodone HCl 10 MG TABS    Sig: Take 1 tablet (10 mg total) by mouth every 6 (six) hours as needed.    Dispense:  120 tablet    Refill:  0    Do not place this medication, or any other prescription from our practice, on "Automatic Refill". Patient may have prescription filled one day early if pharmacy is closed on scheduled refill date. Do not fill until: 07/04/15 To last until: 08/03/15  . Oxycodone HCl 10 MG TABS    Sig: Take 1 tablet (10 mg total) by mouth every 6 (six) hours as needed.    Dispense:  120 tablet    Refill:  0    Do not place this medication, or any other prescription from our practice, on "Automatic Refill". Patient may have prescription filled one day early if  pharmacy is closed on scheduled refill date. Do not fill until: 08/03/15 To last until: 09/02/15  . naloxone [facility]) 2 MG/2ML injection    Sig: Inject content of syringe into thigh muscle. Call 911.    Dispense:  2 Syringe    Refill:  1    NDC # [id number]. Please teach proper use of device.  . pregabalin (LYRICA) 75 MG capsule    Sig: Take 1 capsule (75 mg total) by mouth every 12 (twelve) hours.    Dispense:  60 capsule    Refill:  2    Do not place this medication, or any other prescription from our practice, on "Automatic Refill". Patient may have prescription filled one day early if pharmacy is closed on scheduled refill date.    Lab-work & Procedure Ordered: Orders Placed This Encounter  Procedures  . Radiofrequency,Lumbar    Imaging Ordered: None  Interventional Therapies: Scheduled:  Left lumbar facet radiofrequency ablation under fluoroscopic guidance and IV sedation.    Considering:  Completed a bilateral lumbar facet radiofrequency ablation.    PRN Procedures:  None at this time.    Referral(s) or Consult(s): None at this time.  New Prescriptions   NALOXONE (NARCAN) 2 MG/2ML INJECTION    Inject content of syringe into thigh muscle. Call 911.    Medications administered during this visit: Mr. Willmann had no medications administered during this visit.  Future Appointments Date Time Provider [place]  07/11/2015 2:15 PM Trula Slade, DPM [facility]  08/26/2015 11:20 AM Milinda Pointer, MD [facility] None    Primary Care Physician: [name], MD Location: [facility] Note by: Kathlen Brunswick. Dossie Arbour, M.D, DABA, DABAPM, DABPM, DABIPP, FIPP  Pain Score Disclaimer: We use the NRS-11 scale. This is a self-reported, subjective measurement of pain severity with only modest accuracy. It is used primarily to identify changes within a particular patient. It must be understood that outpatient pain scales are  significantly less accurate that those used for research, where they can be applied under ideal controlled circumstances with minimal exposure to variables. In reality, the score is likely to be a combination of pain intensity and pain affect, where pain affect describes the degree of emotional arousal or changes in action readiness caused by the sensory experience of pain. Factors such as social and work situation, setting, emotional state, anxiety levels, expectation, and prior pain experience may influence pain perception and show large inter-individual differences that may also be affected by time variables.

## 2015-05-29 NOTE — Progress Notes (Signed)
Safety precautions to be maintained throughout the outpatient stay will include: orient to surroundings, keep bed in low position, maintain call bell within reach at all times, provide assistance with transfer out of bed and ambulation.   Did not bring medication bottles to this appointment.

## 2015-05-30 ENCOUNTER — Other Ambulatory Visit: Payer: Self-pay | Admitting: Surgical

## 2015-05-30 MED ORDER — ALPRAZOLAM 0.5 MG PO TABS: 0.5000 mg | ORAL_TABLET | Freq: Two times a day (BID) | ORAL | Status: DC | PRN

## 2015-06-20 ENCOUNTER — Telehealth: Payer: Self-pay

## 2015-06-20 NOTE — Telephone Encounter (Signed)
PA completed for Nasonex on cover my meds, approved.

## 2015-06-21 NOTE — Telephone Encounter (Signed)
Approved through 02/02/2016

## 2015-07-05 ENCOUNTER — Other Ambulatory Visit: Payer: Self-pay | Admitting: Family Medicine

## 2015-07-11 ENCOUNTER — Ambulatory Visit: Payer: Medicare Other | Admitting: Podiatry

## 2015-07-17 ENCOUNTER — Encounter: Payer: Self-pay | Admitting: Family Medicine

## 2015-07-17 ENCOUNTER — Telehealth: Payer: Self-pay | Admitting: Pain Medicine

## 2015-07-17 ENCOUNTER — Ambulatory Visit (INDEPENDENT_AMBULATORY_CARE_PROVIDER_SITE_OTHER): Payer: Medicare Other | Admitting: Family Medicine

## 2015-07-17 VITALS — BP 132/80 | HR 89 | Temp 98.3°F | Ht 72.0 in | Wt 247.8 lb

## 2015-07-17 DIAGNOSIS — E1142 Type 2 diabetes mellitus with diabetic polyneuropathy: Secondary | ICD-10-CM | POA: Diagnosis not present

## 2015-07-17 DIAGNOSIS — N400 Enlarged prostate without lower urinary tract symptoms: Secondary | ICD-10-CM | POA: Diagnosis not present

## 2015-07-17 DIAGNOSIS — Z794 Long term (current) use of insulin: Secondary | ICD-10-CM

## 2015-07-17 DIAGNOSIS — J309 Allergic rhinitis, unspecified: Secondary | ICD-10-CM | POA: Diagnosis not present

## 2015-07-17 DIAGNOSIS — F419 Anxiety disorder, unspecified: Secondary | ICD-10-CM

## 2015-07-17 DIAGNOSIS — F418 Other specified anxiety disorders: Secondary | ICD-10-CM

## 2015-07-17 DIAGNOSIS — F329 Major depressive disorder, single episode, unspecified: Secondary | ICD-10-CM | POA: Insufficient documentation

## 2015-07-17 DIAGNOSIS — I10 Essential (primary) hypertension: Secondary | ICD-10-CM

## 2015-07-17 MED ORDER — INSULIN DETEMIR 100 UNIT/ML ~~LOC~~ SOLN: SUBCUTANEOUS | Status: DC

## 2015-07-17 MED ORDER — PRAVASTATIN SODIUM 40 MG PO TABS: 40.0000 mg | ORAL_TABLET | Freq: Every day | ORAL | Status: DC

## 2015-07-17 MED ORDER — CARVEDILOL 25 MG PO TABS: 25.0000 mg | ORAL_TABLET | Freq: Two times a day (BID) | ORAL | Status: DC

## 2015-07-17 MED ORDER — FLUOXETINE HCL 40 MG PO CAPS: ORAL_CAPSULE | ORAL | Status: DC

## 2015-07-17 MED ORDER — HYDROXYZINE HCL 50 MG PO TABS: ORAL_TABLET | ORAL | Status: DC

## 2015-07-17 MED ORDER — ALPRAZOLAM 0.5 MG PO TABS: 0.5000 mg | ORAL_TABLET | Freq: Two times a day (BID) | ORAL | Status: DC | PRN

## 2015-07-17 MED ORDER — HYDROCHLOROTHIAZIDE 25 MG PO TABS: 25.0000 mg | ORAL_TABLET | Freq: Every day | ORAL | Status: DC

## 2015-07-17 MED ORDER — MAGNESIUM OXIDE 400 (241.3 MG) MG PO TABS: 1.0000 | ORAL_TABLET | Freq: Two times a day (BID) | ORAL | Status: DC

## 2015-07-17 MED ORDER — TAMSULOSIN HCL 0.4 MG PO CAPS: 0.4000 mg | ORAL_CAPSULE | Freq: Two times a day (BID) | ORAL | Status: DC

## 2015-07-17 MED ORDER — METFORMIN HCL 1000 MG PO TABS: 1000.0000 mg | ORAL_TABLET | Freq: Two times a day (BID) | ORAL | Status: DC

## 2015-07-17 MED ORDER — LORATADINE 10 MG PO TABS: 10.0000 mg | ORAL_TABLET | Freq: Every day | ORAL | Status: DC

## 2015-07-17 MED ORDER — MOMETASONE FUROATE 50 MCG/ACT NA SUSP: 2.0000 | Freq: Every day | NASAL | Status: DC

## 2015-07-17 MED ORDER — VALSARTAN 320 MG PO TABS: 320.0000 mg | ORAL_TABLET | Freq: Every day | ORAL | Status: DC

## 2015-07-17 NOTE — Patient Instructions (Addendum)
Nice to see you. We are going to increase her Levemir to 17 units in the morning and 17 units at night. Please call us with the type of NovoLog that you take so that we can refill the correct NovoLog. I have refilled your other medicines. We will start you on Claritin for your allergies. If your anxiety worsens or he develop worsening depression or develop thoughts of harming yourself or if your blood sugars drop below 80 consistently please seek medical attention.

## 2015-07-17 NOTE — Assessment & Plan Note (Signed)
Continue Nasonex. Add Claritin.

## 2015-07-17 NOTE — Assessment & Plan Note (Signed)
At goal. Continue current medications. 

## 2015-07-17 NOTE — Assessment & Plan Note (Signed)
Patient with anxiety and depression mostly surrounding the recent death of his sister. He feels as though this is relatively well controlled at this time. We will refill his Xanax. We'll refill his Prozac as well. He'll continue to monitor this. If there is any worsening he'll let us know. Given return precautions.

## 2015-07-17 NOTE — Progress Notes (Signed)
Patient ID: Alan Schmidt, male   DOB: 07-May-1945, 70 y.o.   MRN: [id number]  [name], MD Phone: [phone]  Alan Schmidt is a 70 y.o. male who presents today for follow-up.  HYPERTENSION Disease Monitoring: Blood pressure range-not checking Chest pain- no      Dyspnea- no Medications: Compliance- taking HCTZ, Coreg, and valsartan   Edema- no  DIABETES Disease Monitoring: Blood Sugar ranges-131-210, reports average 160 Polyuria/phagia/dipsia- mild polyuria      ophthalmology- reports he saw several months ago Medications: Compliance- taking Levemir 16 units in the morning and evening. NovoLog sliding scale that he reports does not take more than 10 units. Does not take this very frequently. He is unsure if he is taking the NovoLog mix are regular NovoLog. Taking metformin and Victoza as well. Hypoglycemic symptoms- no Reports he is followed by podiatry for his feet.  Anxiety/depression: Patient notes this is been somewhat worsened recently as his sister became sick and died. Does note some depression. No SI. Feels as though the Xanax and Prozac help with this. GAD 7 : Generalized Anxiety Score 07/17/2015 03/19/2015  Nervous, Anxious, on Edge 2 0  Control/stop worrying 2 0  Worry too much - different things 2 1  Trouble relaxing 2 0  Restless 1 0  Easily annoyed or irritable 1 0  Afraid - awful might happen 1 0  Total GAD 7 Score 11 1  Anxiety Difficulty Somewhat difficult Somewhat difficult   Depression screen [facility] 2/9 07/17/2015 05/29/2015 04/30/2015  Decreased Interest 1 0 0  Down, Depressed, Hopeless 1 0 0  PHQ - 2 Score 2 0 0  Altered sleeping 1 - -  Tired, decreased energy 1 - -  Change in appetite 1 - -  Feeling bad or failure about yourself  1 - -  Trouble concentrating 1 - -  Moving slowly or fidgety/restless - - -  Suicidal thoughts 0 - -  PHQ-9 Score 7 - -  Difficult doing work/chores Somewhat difficult - -    Allergic rhinitis: Patient notes fair  amount of congestion. Using Nasonex with some benefit. Not on any oral medications for allergies. Notes itchy watery eyes with this. No rhinorrhea.  BPH: Patient notes this is stable on Flomax.  PMH: Former smoker   ROS see history of present illness  Objective  Physical Exam Filed Vitals:   07/17/15 0854  BP: 132/80  Pulse: 89  Temp: 98.3 F (36.8 C)    BP Readings from Last 3 Encounters:  07/17/15 132/80  05/29/15 159/91  05/22/15 130/66   Wt Readings from Last 3 Encounters:  07/17/15 247 lb 12.8 oz (112.401 kg)  05/29/15 250 lb (113.399 kg)  05/22/15 253 lb (114.76 kg)    Physical Exam  Constitutional: He is well-developed, well-nourished, and in no distress.  HENT:  Head: Normocephalic and atraumatic.  Right Ear: External ear normal.  Left Ear: External ear normal.  Mouth/Throat: Oropharynx is clear and moist. No oropharyngeal exudate.  Normal TMs bilaterally  Eyes: Conjunctivae are normal. Pupils are equal, round, and reactive to light.  Cardiovascular: Normal rate, regular rhythm and normal heart sounds.   Pulmonary/Chest: Effort normal and breath sounds normal.  Neurological: He is alert. Gait normal.  Skin: Skin is warm and dry. He is not diaphoretic.  Patient declined foot exam  Psychiatric:  Mood depressed, does appear sad on talking about his sister     Assessment/Plan: Please see individual problem list.  Allergic rhinitis Continue Nasonex. Add  Claritin.  BPH (benign prostatic hyperplasia) Reports this is stable. Continue Flomax.  Diabetes ([place]) Last A1c not at goal. Sugars appear somewhat better controlled. Discussed increasing his Levemir to 17 units twice daily. He'll check to ensure that his NovoLog is the mixed version and he will call us to let us know if this is correct and then this will be refilled. Continue his other medications. He will monitor his sugars. If they drop below 80 he will let us know. He will return in one month for an  A1c.  Anxiety and depression Patient with anxiety and depression mostly surrounding the recent death of his sister. He feels as though this is relatively well controlled at this time. We will refill his Xanax. We'll refill his Prozac as well. He'll continue to monitor this. If there is any worsening he'll let us know. Given return precautions.  Essential hypertension At goal. Continue current medications.    No orders of the defined types were placed in this encounter.    Meds ordered this encounter  Medications  . ALPRAZolam (XANAX) 0.5 MG tablet    Sig: Take 1 tablet (0.5 mg total) by mouth 2 (two) times daily as needed for anxiety.    Dispense:  60 tablet    Refill:  0  . ALPRAZolam (XANAX) 0.5 MG tablet    Sig: Take 1 tablet (0.5 mg total) by mouth 2 (two) times daily as needed for anxiety. Do not fill until 08/16/15    Dispense:  60 tablet    Refill:  0  . ALPRAZolam (XANAX) 0.5 MG tablet    Sig: Take 1 tablet (0.5 mg total) by mouth 2 (two) times daily as needed for anxiety. Do not fill until 09/16/15    Dispense:  60 tablet    Refill:  0  . carvedilol (COREG) 25 MG tablet    Sig: Take 1 tablet (25 mg total) by mouth 2 (two) times daily.    Dispense:  180 tablet    Refill:  3  . FLUoxetine (PROZAC) 40 MG capsule    Sig: TAKE ONE CAPSULE BY MOUTH TWO TIMES DAILY    Dispense:  180 capsule    Refill:  3  . hydrochlorothiazide (HYDRODIURIL) 25 MG tablet    Sig: Take 1 tablet (25 mg total) by mouth daily.    Dispense:  90 tablet    Refill:  3  . hydrOXYzine (ATARAX/VISTARIL) 50 MG tablet    Sig: TAKE 1 TABLET BY MOUTH FOUR TIMES A DAY AS NEEDED    Dispense:  90 tablet    Refill:  1  . insulin detemir (LEVEMIR) 100 UNIT/ML injection    Sig: Inject 17 units into skin in the morning and 17 units into the skin before bedtime    Dispense:  10 mL    Refill:  3  . metFORMIN (GLUCOPHAGE) 1000 MG tablet    Sig: Take 1 tablet (1,000 mg total) by mouth 2 (two) times daily with a  meal.    Dispense:  180 tablet    Refill:  3  . magnesium oxide (MAG-OX) 400 (241.3 Mg) MG tablet    Sig: Take 1 tablet (400 mg total) by mouth 2 (two) times daily.    Dispense:  180 tablet    Refill:  1  . mometasone (NASONEX) 50 MCG/ACT nasal spray    Sig: Place 2 sprays into the nose daily.    Dispense:  17 g    Refill:  3  .  pravastatin (PRAVACHOL) 40 MG tablet    Sig: Take 1 tablet (40 mg total) by mouth daily.    Dispense:  90 tablet    Refill:  3  . tamsulosin (FLOMAX) 0.4 MG CAPS capsule    Sig: Take 1 capsule (0.4 mg total) by mouth 2 (two) times daily.    Dispense:  180 capsule    Refill:  3  . valsartan (DIOVAN) 320 MG tablet    Sig: Take 1 tablet (320 mg total) by mouth daily.    Dispense:  90 tablet    Refill:  3  . loratadine (CLARITIN) 10 MG tablet    Sig: Take 1 tablet (10 mg total) by mouth daily.    Dispense:  30 tablet    Refill:  [place], MD [place]

## 2015-07-17 NOTE — Assessment & Plan Note (Signed)
Reports this is stable. Continue Flomax.

## 2015-07-17 NOTE — Assessment & Plan Note (Addendum)
Last A1c not at goal. Sugars appear somewhat better controlled. Discussed increasing his Levemir to 17 units twice daily. He'll check to ensure that his NovoLog is the mixed version and he will call us to let us know if this is correct and then this will be refilled. Continue his other medications. He will monitor his sugars. If they drop below 80 he will let us know. He will return in one month for an A1c.

## 2015-07-17 NOTE — Progress Notes (Signed)
Pre visit review using our clinic review tool, if applicable. No additional management support is needed unless otherwise documented below in the visit note. 

## 2015-07-17 NOTE — Telephone Encounter (Signed)
Patient received msg and will call you back as he is dealing with death in family, may be some time before he can call

## 2015-07-19 ENCOUNTER — Telehealth: Payer: Self-pay | Admitting: Family Medicine

## 2015-07-19 DIAGNOSIS — E1142 Type 2 diabetes mellitus with diabetic polyneuropathy: Secondary | ICD-10-CM

## 2015-07-19 NOTE — Telephone Encounter (Signed)
Pt called wanting to a referral to see the podiatrist in the [place] area. Pt would like to see someone quickly as possible.   Call pt @ [phone]. Thank you!

## 2015-07-22 NOTE — Telephone Encounter (Signed)
Referral placed.

## 2015-07-22 NOTE — Telephone Encounter (Signed)
Need referral for podiatry. Patient established care with [facility] on 04/09/15.

## 2015-07-22 NOTE — Telephone Encounter (Signed)
Spoken to patient, he stated he wanted Dr. [name] to call and schedule a Podiatry appointment for him due to being unable to get a closer date.

## 2015-07-22 NOTE — Telephone Encounter (Signed)
Voicemail not set upp

## 2015-08-15 ENCOUNTER — Encounter: Payer: Self-pay | Admitting: Podiatry

## 2015-08-15 ENCOUNTER — Ambulatory Visit (INDEPENDENT_AMBULATORY_CARE_PROVIDER_SITE_OTHER): Payer: Medicare Other | Admitting: Podiatry

## 2015-08-15 DIAGNOSIS — B351 Tinea unguium: Secondary | ICD-10-CM

## 2015-08-15 DIAGNOSIS — L84 Corns and callosities: Secondary | ICD-10-CM | POA: Diagnosis not present

## 2015-08-15 DIAGNOSIS — M79676 Pain in unspecified toe(s): Secondary | ICD-10-CM

## 2015-08-15 DIAGNOSIS — E1149 Type 2 diabetes mellitus with other diabetic neurological complication: Secondary | ICD-10-CM | POA: Diagnosis not present

## 2015-08-18 NOTE — Progress Notes (Signed)
Patient ID: Alan Schmidt, male   DOB: [birth year], 70 y.o.   MRN: [id number]  Subjective: 70 y.o. returns the office today for painful, elongated, thickened toenails which he cannot trim himself as well as for callues to the ball of his feet. Denies any redness or drainage around the nails/calluses. Denies any acute changes since last appointment and no new complaints today. Denies any systemic complaints such as fevers, chills, nausea, vomiting.   Objective: AAO 3, NAD DP/PT pulses palpable, CRT less than 3 seconds Nails hypertrophic, dystrophic, elongated, brittle, discolored 10. There is tenderness overlying the nails 1-5 bilaterally. There is no surrounding erythema or drainage along the nail sites. Hyperkeratotic lesions bilateral submetatarsal. Upon debridement no underlying ulceration, drainage or other signs of infection. No open lesions or pre-ulcerative lesions are identified. No other areas of tenderness bilateral lower extremities. No overlying edema, erythema, increased warmth. No pain with calf compression, swelling, warmth, erythema.  Assessment: Patient presents with symptomatic onychomycosis; the metatarsal hyperkeratotic lesions  Plan: -Treatment options including alternatives, risks, complications were discussed -Nails sharply debrided 10 without complication/bleeding. -Hyperkeratotic lesions 2 debrided without complications or bleeding. -Discussed daily foot inspection. If there are any changes, to call the office immediately.  -Follow-up in 3 months or sooner if any problems are to arise. In the meantime, encouraged to call the office with any questions, concerns, changes symptoms.  Celesta Gentile, DPM

## 2015-08-21 ENCOUNTER — Ambulatory Visit: Payer: Medicare Other | Admitting: Podiatry

## 2015-08-26 ENCOUNTER — Encounter: Payer: Medicare Other | Admitting: Pain Medicine

## 2015-09-02 ENCOUNTER — Ambulatory Visit: Payer: Medicare Other | Attending: Pain Medicine | Admitting: Pain Medicine

## 2015-09-02 ENCOUNTER — Encounter: Payer: Self-pay | Admitting: Pain Medicine

## 2015-09-02 VITALS — BP 164/92 | HR 78 | Temp 98.6°F | Resp 18 | Ht 76.0 in | Wt 270.0 lb

## 2015-09-02 DIAGNOSIS — G4733 Obstructive sleep apnea (adult) (pediatric): Secondary | ICD-10-CM | POA: Insufficient documentation

## 2015-09-02 DIAGNOSIS — F418 Other specified anxiety disorders: Secondary | ICD-10-CM | POA: Insufficient documentation

## 2015-09-02 DIAGNOSIS — Z96641 Presence of right artificial hip joint: Secondary | ICD-10-CM | POA: Diagnosis not present

## 2015-09-02 DIAGNOSIS — Z5181 Encounter for therapeutic drug level monitoring: Secondary | ICD-10-CM | POA: Diagnosis not present

## 2015-09-02 DIAGNOSIS — Z794 Long term (current) use of insulin: Secondary | ICD-10-CM | POA: Insufficient documentation

## 2015-09-02 DIAGNOSIS — N400 Enlarged prostate without lower urinary tract symptoms: Secondary | ICD-10-CM | POA: Insufficient documentation

## 2015-09-02 DIAGNOSIS — M79604 Pain in right leg: Secondary | ICD-10-CM | POA: Insufficient documentation

## 2015-09-02 DIAGNOSIS — K219 Gastro-esophageal reflux disease without esophagitis: Secondary | ICD-10-CM | POA: Diagnosis not present

## 2015-09-02 DIAGNOSIS — M5481 Occipital neuralgia: Secondary | ICD-10-CM | POA: Diagnosis not present

## 2015-09-02 DIAGNOSIS — M545 Low back pain: Secondary | ICD-10-CM

## 2015-09-02 DIAGNOSIS — R51 Headache: Secondary | ICD-10-CM | POA: Insufficient documentation

## 2015-09-02 DIAGNOSIS — E785 Hyperlipidemia, unspecified: Secondary | ICD-10-CM | POA: Diagnosis not present

## 2015-09-02 DIAGNOSIS — G8220 Paraplegia, unspecified: Secondary | ICD-10-CM | POA: Insufficient documentation

## 2015-09-02 DIAGNOSIS — M47816 Spondylosis without myelopathy or radiculopathy, lumbar region: Secondary | ICD-10-CM | POA: Diagnosis not present

## 2015-09-02 DIAGNOSIS — M25551 Pain in right hip: Secondary | ICD-10-CM | POA: Diagnosis present

## 2015-09-02 DIAGNOSIS — J309 Allergic rhinitis, unspecified: Secondary | ICD-10-CM | POA: Diagnosis not present

## 2015-09-02 DIAGNOSIS — I129 Hypertensive chronic kidney disease with stage 1 through stage 4 chronic kidney disease, or unspecified chronic kidney disease: Secondary | ICD-10-CM | POA: Diagnosis not present

## 2015-09-02 DIAGNOSIS — E1142 Type 2 diabetes mellitus with diabetic polyneuropathy: Secondary | ICD-10-CM | POA: Diagnosis not present

## 2015-09-02 DIAGNOSIS — G8929 Other chronic pain: Secondary | ICD-10-CM

## 2015-09-02 DIAGNOSIS — F411 Generalized anxiety disorder: Secondary | ICD-10-CM | POA: Diagnosis not present

## 2015-09-02 DIAGNOSIS — M79605 Pain in left leg: Secondary | ICD-10-CM | POA: Insufficient documentation

## 2015-09-02 DIAGNOSIS — M542 Cervicalgia: Secondary | ICD-10-CM | POA: Insufficient documentation

## 2015-09-02 DIAGNOSIS — G8918 Other acute postprocedural pain: Secondary | ICD-10-CM | POA: Insufficient documentation

## 2015-09-02 DIAGNOSIS — F1190 Opioid use, unspecified, uncomplicated: Secondary | ICD-10-CM | POA: Diagnosis not present

## 2015-09-02 DIAGNOSIS — I10 Essential (primary) hypertension: Secondary | ICD-10-CM | POA: Diagnosis not present

## 2015-09-02 DIAGNOSIS — Z79891 Long term (current) use of opiate analgesic: Secondary | ICD-10-CM | POA: Diagnosis not present

## 2015-09-02 DIAGNOSIS — E1122 Type 2 diabetes mellitus with diabetic chronic kidney disease: Secondary | ICD-10-CM | POA: Diagnosis not present

## 2015-09-02 DIAGNOSIS — H9201 Otalgia, right ear: Secondary | ICD-10-CM | POA: Insufficient documentation

## 2015-09-02 DIAGNOSIS — Z87891 Personal history of nicotine dependence: Secondary | ICD-10-CM | POA: Insufficient documentation

## 2015-09-02 MED ORDER — OXYCODONE HCL 10 MG PO TABS: 10.0000 mg | ORAL_TABLET | Freq: Four times a day (QID) | ORAL | 0 refills | Status: DC | PRN

## 2015-09-02 NOTE — Progress Notes (Signed)
Patient's Name: Alan Schmidt  Patient type: Established  MRN: [id number]  Service setting: Ambulatory outpatient  DOB: 04-Aug-1945  Location: [facility]  DOS: 09/02/2015  Primary Care Physician: [name], MD  Note by: Kathlen Brunswick. Dossie Arbour, M.D  Referring Physician: [name], MD  Specialty: Interventional Pain Management  Last Visit to Pain Management: 07/17/2015   Primary Reason(s) for Visit: Encounter for prescription drug management (Level of risk: moderate) CC: Back Pain (lower ); Hip Pain (right); and Neck Pain (left)   HPI  Mr. Rigoni is a 70 y.o. year old, male patient, who returns today as an established patient. 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[place]); Long term current use of opiate analgesic; Long term prescription opiate use; Opiate use (60 MME/Day); Opiate dependence ([place]); Encounter for therapeutic drug level monitoring; Chronic neck pain (midline over the C7 spinous processes) (L>R); Neurogenic pain; Neuropathic pain; Contrast dye allergy; Chronic lower extremity pain (Location of Secondary source of pain) (Bilateral) (R>L); 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) (Right L5 dermatome); History of total hip replacement (Right); Chronic hip pain (Right); Class I Morbid obesity (HCC) (69% higher incidence of chronic low back pain); Essential hypertension; GERD (gastroesophageal reflux disease); Obstructive sleep apnea; Hyperlipidemia; Chronic kidney disease (CKD); Generalized anxiety disorder; Depression; Abnormal nerve conduction studies (severe bilateral lower extremity polyneuropathy); Chronic shoulder pain (Bilateral) (R>L); Occipital neuralgia (Left); Cervicogenic headache (Left); Diabetes ([place]); BPH (benign prostatic hyperplasia); Right ear pain;  Acute postoperative pain (post-radiofrequency); Vasovagal episode; Allergic rhinitis; and Anxiety and depression on his problem list.. His primarily concern today is the Back Pain (lower ); Hip Pain (right); and Neck Pain (left)   Pain Assessment: Self-Reported Pain Score: 4              Reported level is compatible with observation       Pain Type: Chronic pain Pain Location: Neck Pain Orientation: Left Pain Descriptors / Indicators:  (back is aching, radiating...neck is radiating and achy) Pain Frequency: Constant  The patient comes into the clinics today for pharmacological management of his chronic pain. I last saw this patient on 07/17/2015. The patient  reports that he does not use drugs. His body mass index is 32.87 kg/m.  Date of Last Visit: 05/29/15 Service Provided on Last Visit: Med Refill  Controlled Substance Pharmacotherapy Assessment & REMS (Risk Evaluation and Mitigation Strategy)  Analgesic: Oxycodone IR 10 mg every 6 hours (40 mg/day) MME/day: 60 mg/day.  Pill Count: Did not bring pill bottles for count.  Reminded to bring. Final warning given. Pharmacokinetics: Onset of action (Liberation/Absorption): Within expected pharmacological parameters Time to Peak effect (Distribution): Timing and results are as within normal expected parameters Duration of action (Metabolism/Excretion): Within normal limits for medication Pharmacodynamics: Analgesic Effect: More than 50% Activity Facilitation: Medication(s) allow patient to sit, stand, walk, and do the basic ADLs Perceived Effectiveness: Described as relatively effective, allowing for increase in activities of daily living (ADL) Side-effects or Adverse reactions: None reported Monitoring: [place] PMP: Online review of the past 63-month period conducted. Compliant with practice rules and regulations Last UDS on record: [employer]ox[employer] Select 13  Date Value Ref Range Status  03/06/2015 FINAL  Final    Comment:     ==================================================================== TOXASSURE SELECT 13 (MW) ==================================================================== Test  Result       Flag       Units Drug Present and Declared for Prescription Verification   Oxycodone                      2834         EXPECTED   ng/mg creat   Oxymorphone                    114          EXPECTED   ng/mg creat   Noroxycodone                   3933         EXPECTED   ng/mg creat    Sources of oxycodone include scheduled prescription medications.    Oxymorphone and noroxycodone are expected metabolites of    oxycodone. Oxymorphone is also available as a scheduled    prescription medication. Drug Absent but Declared for Prescription Verification   Alprazolam                     Not Detected UNEXPECTED ng/mg creat ==================================================================== Test                      Result    Flag   Units      Ref Range   Creatinine              64               mg/dL      >=20 ==================================================================== Declared Medications:  The flagging and interpretation on this report are based on the  following declared medications.  Unexpected results may arise from  inaccuracies in the declared medications.  **Note: The testing scope of this panel includes these medications:  Alprazolam  Oxycodone  **Note: The testing scope of this panel does not include following  reported medications:  Pregabalin (Lyrica) ==================================================================== For clinical consultation, please call [phone]. ====================================================================    UDS interpretation: Compliant          Medication Assessment Form: Reviewed. Patient indicates being compliant with therapy Treatment compliance: Deficiencies noted and steps taken to remind the patient of the seriousness of  adequate therapy compliance. The patient did not bring his medications to the point. Risk Assessment: Aberrant Behavior: None observed today Substance Use Disorder (SUD) Risk Level: Low-to-moderate Risk of opioid abuse or dependence: 0.7-3.0% with doses ? 36 MME/day and 6.1-26% with doses ? 120 MME/day. Opioid Risk Tool (ORT) Score: Total Score: 2 Low Risk for SUD (Score <3) Depression Scale Score: PHQ-2: PHQ-2 Total Score: 0 No depression (0) PHQ-9: PHQ-9 Total Score: 0 No depression (0-4)  Pharmacologic Plan: No change in therapy, at this time  Laboratory Chemistry  Inflammation Markers Lab Results  Component Value Date   ESRSEDRATE 33 (H) 03/06/2015   CRP 0.5 03/06/2015    Renal Function Lab Results  Component Value Date   BUN 12 04/09/2015   CREATININE 1.21 04/09/2015   GFRAA >60 03/06/2015   GFRNONAA >60 03/06/2015    Hepatic Function Lab Results  Component Value Date   AST 39 (H) 04/09/2015   ALT 52 04/09/2015   ALBUMIN 4.2 04/09/2015    Electrolytes Lab Results  Component Value Date   NA 134 (L) 04/09/2015   K 4.7 04/09/2015   CL 95 (L) 04/09/2015   CALCIUM 9.7 04/09/2015   MG 1.8 03/06/2015  Pain Modulating Vitamins No results found for: [place], [id number], [id number], [id number], 25OHVITD1, 25OHVITD2, 25OHVITD3, VITAMINB12  Coagulation Parameters No results found for: INR, LABPROT, APTT, PLT  Cardiovascular No results found for: BNP, HGB, HCT  Note: Lab results reviewed.  Recent Diagnostic Imaging  No results found.  Meds  The patient has a current medication list which includes the following prescription(s): accu-chek compact plus, accu-chek fastclix lancets, alprazolam, bd pen needle nano u/f, carvedilol, vitamin d3, desoximetasone, fluoxetine, hydrochlorothiazide, hydroxyzine, insulin aspart protamine- aspart, insulin detemir, levemir flextouch, loratadine, magnesium oxide, metformin, mometasone, naloxone, oxycodone hcl, oxycodone hcl,  oxycodone hcl, pravastatin, pregabalin, tamsulosin, valsartan, victoza, and vitamin d (ergocalciferol).  Current Outpatient Prescriptions on File Prior to Visit  Medication Sig  . ACCU-CHEK COMPACT PLUS test strip USE 1 STRIP 7 TIMES DAILY TO TEST  . ACCU-CHEK FASTCLIX LANCETS MISC 1 each by Does not apply route 3 (three) times daily. Check sugar 6 x daily  . ALPRAZolam (XANAX) 0.5 MG tablet Take 1 tablet (0.5 mg total) by mouth 2 (two) times daily as needed for anxiety.  . BD PEN NEEDLE NANO U/F 32G X 4 MM MISC USE EVERY DAY  . carvedilol (COREG) 25 MG tablet Take 1 tablet (25 mg total) by mouth 2 (two) times daily.  . Cholecalciferol (VITAMIN D3) 2000 units capsule Take 2,000 Units by mouth daily.  Marland Kitchen desoximetasone (TOPICORT) 0.25 % cream APPLY CREAM TO AFFECTED AREA TWO TIMES DAILY  . FLUoxetine (PROZAC) 40 MG capsule TAKE ONE CAPSULE BY MOUTH TWO TIMES DAILY  . hydrochlorothiazide (HYDRODIURIL) 25 MG tablet Take 1 tablet (25 mg total) by mouth daily.  . hydrOXYzine (ATARAX/VISTARIL) 50 MG tablet TAKE 1 TABLET BY MOUTH FOUR TIMES A DAY AS NEEDED  . insulin aspart protamine- aspart (NOVOLOG MIX 70/30) (70-30) 100 UNIT/ML injection Inject into the skin. Sliding scale  . insulin detemir (LEVEMIR) 100 UNIT/ML injection Inject 17 units into skin in the morning and 17 units into the skin before bedtime  . loratadine (CLARITIN) 10 MG tablet Take 1 tablet (10 mg total) by mouth daily.  . magnesium oxide (MAG-OX) 400 (241.3 Mg) MG tablet Take 1 tablet (400 mg total) by mouth 2 (two) times daily.  . metFORMIN (GLUCOPHAGE) 1000 MG tablet Take 1 tablet (1,000 mg total) by mouth 2 (two) times daily with a meal.  . mometasone (NASONEX) 50 MCG/ACT nasal spray Place 2 sprays into the nose daily.  . naloxone (NARCAN) 2 MG/2ML injection Inject content of syringe into thigh muscle. Call 911.  . pravastatin (PRAVACHOL) 40 MG tablet Take 1 tablet (40 mg total) by mouth daily.  . pregabalin (LYRICA) 75 MG capsule  Take 1 capsule (75 mg total) by mouth every 12 (twelve) hours.  . tamsulosin (FLOMAX) 0.4 MG CAPS capsule Take 1 capsule (0.4 mg total) by mouth 2 (two) times daily.  . valsartan (DIOVAN) 320 MG tablet Take 1 tablet (320 mg total) by mouth daily.  Marland Kitchen VICTOZA 18 MG/3ML SOPN INJECT 18 UNITS UNDER THE SKIN ONCE DAILY  . Vitamin D, Ergocalciferol, (DRISDOL) 50000 units CAPS capsule TAKE 1 CAPSULE BY MOUTH 2 TIMES A WEEK   No current facility-administered medications on file prior to visit.     ROS  Constitutional: Denies any fever or chills Gastrointestinal: No reported hemesis, hematochezia, vomiting, or acute GI distress Musculoskeletal: Denies any acute onset joint swelling, redness, loss of ROM, or weakness Neurological: No reported episodes of acute onset apraxia, aphasia, dysarthria, agnosia, amnesia, paralysis, loss of coordination, or loss of  consciousness  Allergies  Mr. Mcquerry is allergic to contrast media [iodinated diagnostic agents]; iodine; and shellfish allergy.  [place]  Medical:  Mr. Belt  has a past medical history of Anxiety; Chronic hip pain (Right) (12/05/2014); Chronic lumbar pain; Depression; Hyperlipidemia; Hypertension; Kidney stones; and Migraines. Family: family history includes Cancer in his mother; Heart disease in his father; Stroke in his father. Surgical:  has a past surgical history that includes Total hip arthroplasty; Tonsillectomy; and right hip surgery. Tobacco:  reports that he has quit smoking. He does not have any smokeless tobacco history on file. Alcohol:  reports that he does not drink alcohol. Drug:  reports that he does not use drugs.  Constitutional Exam  Vitals: Blood pressure (!) 164/92, pulse 78, temperature 98.6 F (37 C), resp. rate 18, height 6\' 4"  (1.93 m), weight 270 lb (122.5 kg), SpO2 100 %. General appearance: Well nourished, well developed, and well hydrated. In no acute distress Calculated BMI/Body habitus: Body mass index is  32.87 kg/m.       Psych/Mental status: Alert and oriented x 3 (person, place, & time) Eyes: PERLA Respiratory: No evidence of acute respiratory distress  Cervical Spine Exam  Inspection: No masses, redness, or swelling Alignment: Symmetrical ROM: Functional: ROM is within functional limits [facility]) Stability: No instability detected Muscle strength & Tone: Functionally intact Sensory: Unimpaired Palpation: No complaints of tenderness  Upper Extremity (UE) Exam    Side: Right upper extremity  Side: Left upper extremity  Inspection: No masses, redness, swelling, or asymmetry  Inspection: No masses, redness, swelling, or asymmetry  ROM:  ROM:  Functional: ROM is within functional limits [facility])        Functional: ROM is within functional limits [facility])        Muscle strength & Tone: Functionally intact  Muscle strength & Tone: Functionally intact  Sensory: Unimpaired  Sensory: Unimpaired  Palpation: No complaints of tenderness  Palpation: No complaints of tenderness   Thoracic Spine Exam  Inspection: No masses, redness, or swelling Alignment: Symmetrical ROM: Functional: ROM is within functional limits [facility]) Stability: No instability detected Sensory: Unimpaired Muscle strength & Tone: Functionally intact Palpation: No complaints of tenderness  Lumbar Spine Exam  Inspection: No masses, redness, or swelling Alignment: Symmetrical ROM: Functional: ROM is within functional limits [facility]) Stability: No instability detected Muscle strength & Tone: Functionally intact Sensory: Unimpaired Palpation: No complaints of tenderness Provocative Tests: Lumbar Hyperextension and rotation test: provocative test deferred today       Patrick's Maneuver: provocative test deferred today              Gait & Posture Assessment  Ambulation: Unassisted Gait: Unaffected Posture: WNL   Lower Extremity Exam    Side: Right lower extremity  Side: Left lower extremity  Inspection: No masses, redness,  swelling, or asymmetry ROM:  Inspection: No masses, redness, swelling, or asymmetry ROM:  Functional: ROM is within functional limits [facility])        Functional: ROM is within functional limits [facility])        Muscle strength & Tone: Functionally intact  Muscle strength & Tone: Functionally intact  Sensory: Unimpaired  Sensory: Unimpaired  Palpation: No complaints of tenderness  Palpation: No complaints of tenderness   Assessment & Plan  Primary Diagnosis & Pertinent Problem List: The primary encounter diagnosis was Chronic pain. Diagnoses of Encounter for therapeutic drug level monitoring, Long term current use of opiate analgesic, Opiate use (60 MME/Day), Lumbar spondylosis, unspecified spinal osteoarthritis, and Lumbar facet  syndrome (Bilateral) (R>L) were also pertinent to this visit.  Visit Diagnosis: 1. Chronic pain   2. Encounter for therapeutic drug level monitoring   3. Long term current use of opiate analgesic   4. Opiate use (60 MME/Day)   5. Lumbar spondylosis, unspecified spinal osteoarthritis   6. Lumbar facet syndrome (Bilateral) (R>L)     Problems updated and reviewed during this visit: No problems updated.  Problem-specific Plan(s): No problem-specific Assessment & Plan notes found for this encounter.  No new Assessment & Plan notes have been filed under this hospital service since the last note was generated. Service: Pain Management   Plan of Care   Problem List Items Addressed This Visit      High   Chronic pain - Primary (Chronic)   Relevant Medications   Oxycodone HCl 10 MG TABS   Oxycodone HCl 10 MG TABS   Oxycodone HCl 10 MG TABS   Lumbar facet syndrome (Bilateral) (R>L) (Chronic)   Relevant Medications   Oxycodone HCl 10 MG TABS   Oxycodone HCl 10 MG TABS   Oxycodone HCl 10 MG TABS   Other Relevant Orders   Radiofrequency,Lumbar   Lumbar spondylosis (Chronic)   Relevant Medications   Oxycodone HCl 10 MG TABS   Oxycodone HCl 10 MG TABS   Oxycodone  HCl 10 MG TABS   Other Relevant Orders   Radiofrequency,Lumbar     Medium   Long term current use of opiate analgesic (Chronic)   Opiate use (60 MME/Day) (Chronic)   Encounter for therapeutic drug level monitoring    Other Visit Diagnoses   None.      Pharmacotherapy (Medications Ordered): Meds ordered this encounter  Medications  . Oxycodone HCl 10 MG TABS    Sig: Take 1 tablet (10 mg total) by mouth every 6 (six) hours as needed.    Dispense:  120 tablet    Refill:  0    Do not place this medication, or any other prescription from our practice, on "Automatic Refill". Patient may have prescription filled one day early if pharmacy is closed on scheduled refill date. Do not fill until: 09/02/15 To last until: 10/02/15  . Oxycodone HCl 10 MG TABS    Sig: Take 1 tablet (10 mg total) by mouth every 6 (six) hours as needed.    Dispense:  120 tablet    Refill:  0    Do not place this medication, or any other prescription from our practice, on "Automatic Refill". Patient may have prescription filled one day early if pharmacy is closed on scheduled refill date. Do not fill until: 10/02/15 To last until: 11/01/15  . Oxycodone HCl 10 MG TABS    Sig: Take 1 tablet (10 mg total) by mouth every 6 (six) hours as needed.    Dispense:  120 tablet    Refill:  0    Do not place this medication, or any other prescription from our practice, on "Automatic Refill". Patient may have prescription filled one day early if pharmacy is closed on scheduled refill date. Do not fill until: 11/01/15 To last until: 12/01/15    [facility] & Procedure Ordered: Orders Placed This Encounter  Procedures  . Radiofrequency,Lumbar    Imaging Ordered: None  Interventional Therapies: Scheduled:  None at this time.    Considering:  Completed bilateral lumbar facet radiofrequency ablation under fluoroscopic guidance and IV sedation.    PRN Procedures:  Left sided lumbar facet radiofrequency ablation under  fluoroscopic guidance and IV  sedation.    Referral(s) or Consult(s): None at this time.  New Prescriptions   No medications on file    Medications administered during this visit: Mr. Riccitelli had no medications administered during this visit.  Requested PM Follow-up: Return in about 2 months (around 11/11/2015) for Med-Mgmt, (3-Mo), (PRN) Procedure.  No future appointments.  Primary Care Physician: [name], MD Location: [facility] Note by: Kathlen Brunswick. Dossie Arbour, M.D, DABA, DABAPM, DABPM, DABIPP, FIPP  Pain Score Disclaimer: We use the NRS-11 scale. This is a self-reported, subjective measurement of pain severity with only modest accuracy. It is used primarily to identify changes within a particular patient. It must be understood that outpatient pain scales are significantly less accurate that those used for research, where they can be applied under ideal controlled circumstances with minimal exposure to variables. In reality, the score is likely to be a combination of pain intensity and pain affect, where pain affect describes the degree of emotional arousal or changes in action readiness caused by the sensory experience of pain. Factors such as social and work situation, setting, emotional state, anxiety levels, expectation, and prior pain experience may influence pain perception and show large inter-individual differences that may also be affected by time variables.  Patient instructions provided during this appointment: Patient Instructions   Radiofrequency Lesioning Radiofrequency lesioning is a procedure that is performed to relieve pain. The procedure is often used for back, neck, or arm pain. Radiofrequency lesioning involves the use of a machine that creates radio waves to make heat. During the procedure, the heat is applied to the nerve that carries the pain signal. The heat damages the nerve and interferes with the pain signal. Pain relief  usually lasts for 6 months to 1 year. LET [facility] CARE PROVIDER KNOW ABOUT:  Any allergies you have.  All medicines you are taking, including vitamins, herbs, eye drops, creams, and over-the-counter medicines.  Previous problems you or members of your family have had with the use of anesthetics.  Any blood disorders you have.  Previous surgeries you have had.  Any medical conditions you have.  Whether you are pregnant or may be pregnant. RISKS AND COMPLICATIONS Generally, this is a safe procedure. However, problems may occur, including:  Pain or soreness at the injection site.  Infection at the injection site.  Damage to nerves or blood vessels. BEFORE THE PROCEDURE  Ask your health care provider about:  Changing or stopping your regular medicines. This is especially important if you are taking diabetes medicines or blood thinners.  Taking medicines such as aspirin and ibuprofen. These medicines can thin your blood. Do not take these medicines before your procedure if your health care provider instructs you not to.  Follow instructions from your health care provider about eating or drinking restrictions.  Plan to have someone take you home after the procedure.  If you go home right after the procedure, plan to have someone with you for 24 hours. PROCEDURE  You will be given one or more of the following:  A medicine to help you relax (sedative).  A medicine to numb the area (local anesthetic).  You will be awake during the procedure. You will need to be able to talk with the health care provider during the procedure.  With the help of a type of X-ray (fluoroscopy), the health care provider will insert a radiofrequency needle into the area to be treated.  Next, a wire that carries the radio waves (electrode) will  be put through the radiofrequency needle. An electrical pulse will be sent through the electrode to verify the correct nerve. You will feel a tingling  sensation, and you may have muscle twitching.  Then, the tissue that is around the needle tip will be heated by an electric current that is passed using the radiofrequency machine. This will numb the nerves.  A bandage (dressing) will be put on the insertion area after the procedure is done. The procedure may vary among health care providers and hospitals. AFTER THE PROCEDURE  Your blood pressure, heart rate, breathing rate, and blood oxygen level will be monitored often until the medicines you were given have worn off.  Return to your normal activities as directed by your health care provider.   This information is not intended to replace advice given to you by your health care provider. Make sure you discuss any questions you have with your health care provider.   Document Released: 09/17/2010 Document Revised: 10/10/2014 Document Reviewed: 02/26/2014 Elsevier Interactive Patient Education 2016 [place]  What are the risk, side effects and possible complications? Generally speaking, most procedures are safe.  However, with any procedure there are risks, side effects, and the possibility of complications.  The risks and complications are dependent upon the sites that are lesioned, or the type of nerve block to be performed.  The closer the procedure is to the spine, the more serious the risks are.  Great care is taken when placing the radio frequency needles, block needles or lesioning probes, but sometimes complications can occur. 1. Infection: Any time there is an injection through the skin, there is a risk of infection.  This is why sterile conditions are used for these blocks.  There are four possible types of infection. 1. Localized skin infection. 2. Central Nervous System Infection-This can be in the form of Meningitis, which can be deadly. 3. Epidural Infections-This can be in the form of an epidural abscess, which can cause pressure inside of the  spine, causing compression of the spinal cord with subsequent paralysis. This would require an emergency surgery to decompress, and there are no guarantees that the patient would recover from the paralysis. 4. Discitis-This is an infection of the intervertebral discs.  It occurs in about 1% of discography procedures.  It is difficult to treat and it may lead to surgery.        2. Pain: the needles have to go through skin and soft tissues, will cause soreness.       3. Damage to internal structures:  The nerves to be lesioned may be near blood vessels or    other nerves which can be potentially damaged.       4. Bleeding: Bleeding is more common if the patient is taking blood thinners such as  aspirin, Coumadin, Ticiid, Plavix, etc., or if he/she have some genetic predisposition  such as hemophilia. Bleeding into the spinal canal can cause compression of the spinal  cord with subsequent paralysis.  This would require an emergency surgery to  decompress and there are no guarantees that the patient would recover from the  paralysis.       5. Pneumothorax:  Puncturing of a lung is a possibility, every time a needle is introduced in  the area of the chest or upper back.  Pneumothorax refers to free air around the  collapsed lung(s), inside of the thoracic cavity (chest cavity).  Another two possible  complications related to a  similar event would include: Hemothorax and Chylothorax.   These are variations of the Pneumothorax, where instead of air around the collapsed  lung(s), you may have blood or chyle, respectively.       6. Spinal headaches: They may occur with any procedures in the area of the spine.       7. Persistent CSF (Cerebro-Spinal Fluid) leakage: This is a rare problem, but may occur  with prolonged intrathecal or epidural catheters either due to the formation of a fistulous  track or a dural tear.       8. Nerve damage: By working so close to the spinal cord, there is always a possibility of   nerve damage, which could be as serious as a permanent spinal cord injury with  paralysis.       9. Death:  Although rare, severe deadly allergic reactions known as "Anaphylactic  reaction" can occur to any of the medications used.      10. Worsening of the symptoms:  We can always make thing worse.  What are the chances of something like this happening? Chances of any of this occuring are extremely low.  By statistics, you have more of a chance of getting killed in a motor vehicle accident: while driving to the hospital than any of the above occurring .  Nevertheless, you should be aware that they are possibilities.  In general, it is similar to taking a shower.  Everybody knows that you can slip, hit your head and get killed.  Does that mean that you should not shower again?  Nevertheless always keep in mind that statistics do not mean anything if you happen to be on the wrong side of them.  Even if a procedure has a 1 (one) in a 1,000,000 (million) chance of going wrong, it you happen to be that one..Also, keep in mind that by statistics, you have more of a chance of having something go wrong when taking medications.  Who should not have this procedure? If you are on a blood thinning medication (e.g. Coumadin, Plavix, see list of "Blood Thinners"), or if you have an active infection going on, you should not have the procedure.  If you are taking any blood thinners, please inform your physician.  How should I prepare for this procedure?  Do not eat or drink anything at least six hours prior to the procedure.  Bring a driver with you .  It cannot be a taxi.  Come accompanied by an adult that can drive you back, and that is strong enough to help you if your legs get weak or numb from the local anesthetic.  Take all of your medicines the morning of the procedure with just enough water to swallow them.  If you have diabetes, make sure that you are scheduled to have your procedure done first thing  in the morning, whenever possible.  If you have diabetes, take only half of your insulin dose and notify our nurse that you have done so as soon as you arrive at the clinic.  If you are diabetic, but only take blood sugar pills (oral hypoglycemic), then do not take them on the morning of your procedure.  You may take them after you have had the procedure.  Do not take aspirin or any aspirin-containing medications, at least eleven (11) days prior to the procedure.  They may prolong bleeding.  Wear loose fitting clothing that may be easy to take off and that you would not mind  if it got stained with Betadine or blood.  Do not wear any jewelry or perfume  Remove any nail coloring.  It will interfere with some of our monitoring equipment.  NOTE: Remember that this is not meant to be interpreted as a complete list of all possible complications.  Unforeseen problems may occur.  BLOOD THINNERS The following drugs contain aspirin or other products, which can cause increased bleeding during surgery and should not be taken for 2 weeks prior to and 1 week after surgery.  If you should need take something for relief of minor pain, you may take acetaminophen which is found in Tylenol,m Datril, Anacin-3 and Panadol. It is not blood thinner. The products listed below are.  Do not take any of the products listed below in addition to any listed on your instruction sheet.  A.P.C or A.P.C with Codeine Codeine Phosphate Capsules #3 Ibuprofen Ridaura  ABC compound Congesprin Imuran rimadil  Advil Cope Indocin Robaxisal  Alka-Seltzer Effervescent Pain Reliever and Antacid Coricidin or Coricidin-D  Indomethacin Rufen  Alka-Seltzer plus Cold Medicine Cosprin Ketoprofen S-A-C Tablets  Anacin Analgesic Tablets or Capsules Coumadin Korlgesic Salflex  Anacin Extra Strength Analgesic tablets or capsules CP-2 Tablets Lanoril Salicylate  Anaprox Cuprimine Capsules Levenox Salocol  Anexsia-D Dalteparin Magan Salsalate   Anodynos Darvon compound Magnesium Salicylate Sine-off  Ansaid Dasin Capsules Magsal Sodium Salicylate  Anturane Depen Capsules Marnal Soma  APF Arthritis pain formula Dewitt's Pills Measurin Stanback  Argesic Dia-Gesic Meclofenamic Sulfinpyrazone  Arthritis Bayer Timed Release Aspirin Diclofenac Meclomen Sulindac  Arthritis pain formula Anacin Dicumarol Medipren Supac  Analgesic (Safety coated) Arthralgen Diffunasal Mefanamic Suprofen  Arthritis Strength Bufferin Dihydrocodeine Mepro Compound Suprol  Arthropan liquid Dopirydamole Methcarbomol with Aspirin Synalgos  ASA tablets/Enseals Disalcid Micrainin Tagament  Ascriptin Doan's Midol Talwin  Ascriptin A/D Dolene Mobidin Tanderil  Ascriptin Extra Strength Dolobid Moblgesic Ticlid  Ascriptin with Codeine Doloprin or Doloprin with Codeine Momentum Tolectin  Asperbuf Duoprin Mono-gesic Trendar  Aspergum Duradyne Motrin or Motrin IB Triminicin  Aspirin plain, buffered or enteric coated Durasal Myochrisine Trigesic  Aspirin Suppositories Easprin Nalfon Trillsate  Aspirin with Codeine Ecotrin Regular or Extra Strength Naprosyn Uracel  Atromid-S Efficin Naproxen Ursinus  Auranofin Capsules Elmiron Neocylate Vanquish  Axotal Emagrin Norgesic Verin  Azathioprine Empirin or Empirin with Codeine Normiflo Vitamin E  Azolid Emprazil Nuprin Voltaren  Bayer Aspirin plain, buffered or children's or timed BC Tablets or powders Encaprin Orgaran Warfarin Sodium  Buff-a-Comp Enoxaparin Orudis Zorpin  Buff-a-Comp with Codeine Equegesic Os-Cal-Gesic   Buffaprin Excedrin plain, buffered or Extra Strength Oxalid   Bufferin Arthritis Strength Feldene Oxphenbutazone   Bufferin plain or Extra Strength Feldene Capsules Oxycodone with Aspirin   Bufferin with Codeine Fenoprofen Fenoprofen Pabalate or Pabalate-SF   Buffets II Flogesic Panagesic   Buffinol plain or Extra Strength Florinal or Florinal with Codeine Panwarfarin   Buf-Tabs Flurbiprofen  Penicillamine   Butalbital Compound Four-way cold tablets Penicillin   Butazolidin Fragmin Pepto-Bismol   Carbenicillin Geminisyn Percodan   Carna Arthritis Reliever Geopen Persantine   Carprofen Gold's salt Persistin   Chloramphenicol Goody's Phenylbutazone   Chloromycetin Haltrain Piroxlcam   Clmetidine heparin Plaquenil   Cllnoril Hyco-pap Ponstel   Clofibrate Hydroxy chloroquine Propoxyphen         Before stopping any of these medications, be sure to consult the physician who ordered them.  Some, such as Coumadin (Warfarin) are ordered to prevent or treat serious conditions such as "deep thrombosis", "pumonary embolisms", and other heart problems.  The amount  of time that you may need off of the medication may also vary with the medication and the reason for which you were taking it.  If you are taking any of these medications, please make sure you notify your pain physician before you undergo any procedures.

## 2015-09-02 NOTE — Patient Instructions (Signed)
Radiofrequency Lesioning Radiofrequency lesioning is a procedure that is performed to relieve pain. The procedure is often used for back, neck, or arm pain. Radiofrequency lesioning involves the use of a machine that creates radio waves to make heat. During the procedure, the heat is applied to the nerve that carries the pain signal. The heat damages the nerve and interferes with the pain signal. Pain relief usually lasts for 6 months to 1 year. LET [facility] CARE PROVIDER KNOW ABOUT:  Any allergies you have.  All medicines you are taking, including vitamins, herbs, eye drops, creams, and over-the-counter medicines.  Previous problems you or members of your family have had with the use of anesthetics.  Any blood disorders you have.  Previous surgeries you have had.  Any medical conditions you have.  Whether you are pregnant or may be pregnant. RISKS AND COMPLICATIONS Generally, this is a safe procedure. However, problems may occur, including:  Pain or soreness at the injection site.  Infection at the injection site.  Damage to nerves or blood vessels. BEFORE THE PROCEDURE  Ask your health care provider about:  Changing or stopping your regular medicines. This is especially important if you are taking diabetes medicines or blood thinners.  Taking medicines such as aspirin and ibuprofen. These medicines can thin your blood. Do not take these medicines before your procedure if your health care provider instructs you not to.  Follow instructions from your health care provider about eating or drinking restrictions.  Plan to have someone take you home after the procedure.  If you go home right after the procedure, plan to have someone with you for 24 hours. PROCEDURE  You will be given one or more of the following:  A medicine to help you relax (sedative).  A medicine to numb the area (local anesthetic).  You will be awake during the procedure. You will need to be able to  talk with the health care provider during the procedure.  With the help of a type of X-ray (fluoroscopy), the health care provider will insert a radiofrequency needle into the area to be treated.  Next, a wire that carries the radio waves (electrode) will be put through the radiofrequency needle. An electrical pulse will be sent through the electrode to verify the correct nerve. You will feel a tingling sensation, and you may have muscle twitching.  Then, the tissue that is around the needle tip will be heated by an electric current that is passed using the radiofrequency machine. This will numb the nerves.  A bandage (dressing) will be put on the insertion area after the procedure is done. The procedure may vary among health care providers and hospitals. AFTER THE PROCEDURE  Your blood pressure, heart rate, breathing rate, and blood oxygen level will be monitored often until the medicines you were given have worn off.  Return to your normal activities as directed by your health care provider.   This information is not intended to replace advice given to you by your health care provider. Make sure you discuss any questions you have with your health care provider.   Document Released: 09/17/2010 Document Revised: 10/10/2014 Document Reviewed: 02/26/2014 Elsevier Interactive Patient Education 2016 [place]  What are the risk, side effects and possible complications? Generally speaking, most procedures are safe.  However, with any procedure there are risks, side effects, and the possibility of complications.  The risks and complications are dependent upon the sites that are lesioned, or the  type of nerve block to be performed.  The closer the procedure is to the spine, the more serious the risks are.  Great care is taken when placing the radio frequency needles, block needles or lesioning probes, but sometimes complications can occur. 1. Infection: Any time  there is an injection through the skin, there is a risk of infection.  This is why sterile conditions are used for these blocks.  There are four possible types of infection. 1. Localized skin infection. 2. Central Nervous System Infection-This can be in the form of Meningitis, which can be deadly. 3. Epidural Infections-This can be in the form of an epidural abscess, which can cause pressure inside of the spine, causing compression of the spinal cord with subsequent paralysis. This would require an emergency surgery to decompress, and there are no guarantees that the patient would recover from the paralysis. 4. Discitis-This is an infection of the intervertebral discs.  It occurs in about 1% of discography procedures.  It is difficult to treat and it may lead to surgery.        2. Pain: the needles have to go through skin and soft tissues, will cause soreness.       3. Damage to internal structures:  The nerves to be lesioned may be near blood vessels or    other nerves which can be potentially damaged.       4. Bleeding: Bleeding is more common if the patient is taking blood thinners such as  aspirin, Coumadin, Ticiid, Plavix, etc., or if he/she have some genetic predisposition  such as hemophilia. Bleeding into the spinal canal can cause compression of the spinal  cord with subsequent paralysis.  This would require an emergency surgery to  decompress and there are no guarantees that the patient would recover from the  paralysis.       5. Pneumothorax:  Puncturing of a lung is a possibility, every time a needle is introduced in  the area of the chest or upper back.  Pneumothorax refers to free air around the  collapsed lung(s), inside of the thoracic cavity (chest cavity).  Another two possible  complications related to a similar event would include: Hemothorax and Chylothorax.   These are variations of the Pneumothorax, where instead of air around the collapsed  lung(s), you may have blood or chyle,  respectively.       6. Spinal headaches: They may occur with any procedures in the area of the spine.       7. Persistent CSF (Cerebro-Spinal Fluid) leakage: This is a rare problem, but may occur  with prolonged intrathecal or epidural catheters either due to the formation of a fistulous  track or a dural tear.       8. Nerve damage: By working so close to the spinal cord, there is always a possibility of  nerve damage, which could be as serious as a permanent spinal cord injury with  paralysis.       9. Death:  Although rare, severe deadly allergic reactions known as "Anaphylactic  reaction" can occur to any of the medications used.      10. Worsening of the symptoms:  We can always make thing worse.  What are the chances of something like this happening? Chances of any of this occuring are extremely low.  By statistics, you have more of a chance of getting killed in a motor vehicle accident: while driving to the hospital than any of the above occurring .  Nevertheless, you should be aware that they are possibilities.  In general, it is similar to taking a shower.  Everybody knows that you can slip, hit your head and get killed.  Does that mean that you should not shower again?  Nevertheless always keep in mind that statistics do not mean anything if you happen to be on the wrong side of them.  Even if a procedure has a 1 (one) in a 1,000,000 (million) chance of going wrong, it you happen to be that one..Also, keep in mind that by statistics, you have more of a chance of having something go wrong when taking medications.  Who should not have this procedure? If you are on a blood thinning medication (e.g. Coumadin, Plavix, see list of "Blood Thinners"), or if you have an active infection going on, you should not have the procedure.  If you are taking any blood thinners, please inform your physician.  How should I prepare for this procedure?  Do not eat or drink anything at least six hours prior to the  procedure.  Bring a driver with you .  It cannot be a taxi.  Come accompanied by an adult that can drive you back, and that is strong enough to help you if your legs get weak or numb from the local anesthetic.  Take all of your medicines the morning of the procedure with just enough water to swallow them.  If you have diabetes, make sure that you are scheduled to have your procedure done first thing in the morning, whenever possible.  If you have diabetes, take only half of your insulin dose and notify our nurse that you have done so as soon as you arrive at the clinic.  If you are diabetic, but only take blood sugar pills (oral hypoglycemic), then do not take them on the morning of your procedure.  You may take them after you have had the procedure.  Do not take aspirin or any aspirin-containing medications, at least eleven (11) days prior to the procedure.  They may prolong bleeding.  Wear loose fitting clothing that may be easy to take off and that you would not mind if it got stained with Betadine or blood.  Do not wear any jewelry or perfume  Remove any nail coloring.  It will interfere with some of our monitoring equipment.  NOTE: Remember that this is not meant to be interpreted as a complete list of all possible complications.  Unforeseen problems may occur.  BLOOD THINNERS The following drugs contain aspirin or other products, which can cause increased bleeding during surgery and should not be taken for 2 weeks prior to and 1 week after surgery.  If you should need take something for relief of minor pain, you may take acetaminophen which is found in Tylenol,m Datril, Anacin-3 and Panadol. It is not blood thinner. The products listed below are.  Do not take any of the products listed below in addition to any listed on your instruction sheet.  A.P.C or A.P.C with Codeine Codeine Phosphate Capsules #3 Ibuprofen Ridaura  ABC compound Congesprin Imuran rimadil  Advil Cope Indocin  Robaxisal  Alka-Seltzer Effervescent Pain Reliever and Antacid Coricidin or Coricidin-D  Indomethacin Rufen  Alka-Seltzer plus Cold Medicine Cosprin Ketoprofen S-A-C Tablets  Anacin Analgesic Tablets or Capsules Coumadin Korlgesic Salflex  Anacin Extra Strength Analgesic tablets or capsules CP-2 Tablets Lanoril Salicylate  Anaprox Cuprimine Capsules Levenox Salocol  Anexsia-D Dalteparin Magan Salsalate  Anodynos Darvon compound Magnesium Salicylate Sine-off  Ansaid Dasin Capsules Magsal Sodium Salicylate  Anturane Depen Capsules Marnal Soma  APF Arthritis pain formula Dewitt's Pills Measurin Stanback  Argesic Dia-Gesic Meclofenamic Sulfinpyrazone  Arthritis Bayer Timed Release Aspirin Diclofenac Meclomen Sulindac  Arthritis pain formula Anacin Dicumarol Medipren Supac  Analgesic (Safety coated) Arthralgen Diffunasal Mefanamic Suprofen  Arthritis Strength Bufferin Dihydrocodeine Mepro Compound Suprol  Arthropan liquid Dopirydamole Methcarbomol with Aspirin Synalgos  ASA tablets/Enseals Disalcid Micrainin Tagament  Ascriptin Doan's Midol Talwin  Ascriptin A/D Dolene Mobidin Tanderil  Ascriptin Extra Strength Dolobid Moblgesic Ticlid  Ascriptin with Codeine Doloprin or Doloprin with Codeine Momentum Tolectin  Asperbuf Duoprin Mono-gesic Trendar  Aspergum Duradyne Motrin or Motrin IB Triminicin  Aspirin plain, buffered or enteric coated Durasal Myochrisine Trigesic  Aspirin Suppositories Easprin Nalfon Trillsate  Aspirin with Codeine Ecotrin Regular or Extra Strength Naprosyn Uracel  Atromid-S Efficin Naproxen Ursinus  Auranofin Capsules Elmiron Neocylate Vanquish  Axotal Emagrin Norgesic Verin  Azathioprine Empirin or Empirin with Codeine Normiflo Vitamin E  Azolid Emprazil Nuprin Voltaren  Bayer Aspirin plain, buffered or children's or timed BC Tablets or powders Encaprin Orgaran Warfarin Sodium  Buff-a-Comp Enoxaparin Orudis Zorpin  Buff-a-Comp with Codeine Equegesic Os-Cal-Gesic    Buffaprin Excedrin plain, buffered or Extra Strength Oxalid   Bufferin Arthritis Strength Feldene Oxphenbutazone   Bufferin plain or Extra Strength Feldene Capsules Oxycodone with Aspirin   Bufferin with Codeine Fenoprofen Fenoprofen Pabalate or Pabalate-SF   Buffets II Flogesic Panagesic   Buffinol plain or Extra Strength Florinal or Florinal with Codeine Panwarfarin   Buf-Tabs Flurbiprofen Penicillamine   Butalbital Compound Four-way cold tablets Penicillin   Butazolidin Fragmin Pepto-Bismol   Carbenicillin Geminisyn Percodan   Carna Arthritis Reliever Geopen Persantine   Carprofen Gold's salt Persistin   Chloramphenicol Goody's Phenylbutazone   Chloromycetin Haltrain Piroxlcam   Clmetidine heparin Plaquenil   Cllnoril Hyco-pap Ponstel   Clofibrate Hydroxy chloroquine Propoxyphen         Before stopping any of these medications, be sure to consult the physician who ordered them.  Some, such as Coumadin (Warfarin) are ordered to prevent or treat serious conditions such as "deep thrombosis", "pumonary embolisms", and other heart problems.  The amount of time that you may need off of the medication may also vary with the medication and the reason for which you were taking it.  If you are taking any of these medications, please make sure you notify your pain physician before you undergo any procedures.

## 2015-09-02 NOTE — Progress Notes (Signed)
Safety precautions to be maintained throughout the outpatient stay will include: orient to surroundings, keep bed in low position, maintain call bell within reach at all times, provide assistance with transfer out of bed and ambulation. Did not bring pill bottles for count.  Reminded to bring.

## 2015-09-19 ENCOUNTER — Telehealth: Payer: Self-pay | Admitting: Pain Medicine

## 2015-09-19 ENCOUNTER — Other Ambulatory Visit: Payer: Self-pay | Admitting: Family Medicine

## 2015-09-19 NOTE — Telephone Encounter (Signed)
Patient calling about prior auth for lower back and wants to know if he can have procedure in neck area also?

## 2015-09-20 NOTE — Telephone Encounter (Signed)
Please confirm directions for this medication with the patient as the 18 units portion does not make sense.

## 2015-09-20 NOTE — Telephone Encounter (Signed)
Can we refill this? Historical provider

## 2015-09-20 NOTE — Telephone Encounter (Signed)
Contacted patient, notified him that our insurance person is not here today, I will check with her next week and call him back. Angie, please advise me on this.

## 2015-09-23 NOTE — Telephone Encounter (Signed)
LVM for patient to call me about request for procedure authorization.  I need to clarify what procedure authorization he is talking about since the only order I see is a PRN RFA from 09/02/2015.

## 2015-09-25 NOTE — Telephone Encounter (Signed)
Left message for patient to return call.

## 2015-10-01 NOTE — Telephone Encounter (Signed)
I finally spoke with [name] and verified that he wants to proceed with RFA - call to [facility] no prior auth required - scheduled patient 11/26/2015 (next available RF date) and added to wait list.

## 2015-10-10 ENCOUNTER — Other Ambulatory Visit: Payer: Self-pay | Admitting: Family Medicine

## 2015-10-10 NOTE — Telephone Encounter (Signed)
LM for patient to return call.

## 2015-10-20 ENCOUNTER — Other Ambulatory Visit: Payer: Self-pay | Admitting: Family Medicine

## 2015-10-21 NOTE — Telephone Encounter (Signed)
Last filled and last OV 07/17/15 #60, OK to refill?

## 2015-10-21 NOTE — Telephone Encounter (Signed)
Faxed to [id number] - [place], [place] - [address]: [phone]   

## 2015-10-21 NOTE — Telephone Encounter (Signed)
Please fax to pharmacy. Patient needs an appointment scheduled in the next 1-2 months for follow-up.

## 2015-11-04 DIAGNOSIS — L851 Acquired keratosis [keratoderma] palmaris et plantaris: Secondary | ICD-10-CM | POA: Diagnosis not present

## 2015-11-04 DIAGNOSIS — Q6689 Other  specified congenital deformities of feet: Secondary | ICD-10-CM | POA: Diagnosis not present

## 2015-11-04 DIAGNOSIS — B351 Tinea unguium: Secondary | ICD-10-CM | POA: Diagnosis not present

## 2015-11-04 DIAGNOSIS — E119 Type 2 diabetes mellitus without complications: Secondary | ICD-10-CM | POA: Diagnosis not present

## 2015-11-04 DIAGNOSIS — M7751 Other enthesopathy of right foot: Secondary | ICD-10-CM | POA: Diagnosis not present

## 2015-11-06 ENCOUNTER — Ambulatory Visit: Payer: Medicare Other | Admitting: Family Medicine

## 2015-11-06 NOTE — Telephone Encounter (Signed)
Patient missed his appointment this afternoon. Please see if he is taking 1.8 mg. 18 units is not a correct dosing of this medication. Patient will need to set up a follow-up appointment as well.

## 2015-11-06 NOTE — Telephone Encounter (Signed)
Spoke to patient. Confirmed patient does take 18 units of Victoza daily in the a.m. Requesting refill. Patient would like his A1C checked. Advised patient to schedule follow-up appt. Scheduled appt for 2:30 today per patient's schedule. Confirmed he will call if he cannot make appt today.

## 2015-11-07 ENCOUNTER — Telehealth: Payer: Self-pay | Admitting: *Deleted

## 2015-11-07 NOTE — Telephone Encounter (Signed)
Returned patients call. No answer.

## 2015-11-07 NOTE — Telephone Encounter (Signed)
Called patient left vmail to return call to clarify units taking.

## 2015-11-07 NOTE — Telephone Encounter (Signed)
Voicemail: pt stated that he missed a call today and can be reached at [phone]

## 2015-11-08 MED ORDER — VICTOZA 18 MG/3ML ~~LOC~~ SOPN: 1.8000 mg | PEN_INJECTOR | Freq: Every day | SUBCUTANEOUS | 1 refills | Status: DC

## 2015-11-08 NOTE — Telephone Encounter (Signed)
Called patient to advise. No answer. Will try later.

## 2015-11-08 NOTE — Telephone Encounter (Signed)
Sent to pharmacy. Patient will have to come in for follow-up for further refills. Please inform them of this.

## 2015-11-08 NOTE — Telephone Encounter (Signed)
Patient states he is taking 1.8 units of Victoza qd. He said he tried to call back and make the office aware that he would not be able to make the appt scheduled for 10/04, but was unable to get through. Attempted to schedule a future appt. Patient stated he is having a back procedure next week and insisted he will call after that to schedule appt.

## 2015-11-11 NOTE — Telephone Encounter (Signed)
LM for patient to call the office to schedule a follow up appointment

## 2015-11-12 ENCOUNTER — Encounter: Payer: Medicare Other | Admitting: Pain Medicine

## 2015-11-12 NOTE — Progress Notes (Signed)
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 active infection going on, you should not have the procedure.  If you are taking any blood thinners, please inform your physician.  How should I prepare for  this procedure? Do not eat or drink anything at least six hours prior to the procedure. Bring a driver with you .  It cannot be a taxi. Come accompanied by an adult that can drive you back, and that is strong enough to help you if your legs get weak or numb from the local anesthetic. Take all of your medicines the morning of the procedure with just enough water to swallow them. If you have diabetes, make sure that you are scheduled to have your procedure done first thing in the morning, whenever possible. If you have diabetes, take only half of your insulin dose and notify our nurse that you have done so as soon as you arrive at the clinic. If you are diabetic, but only take blood sugar pills (oral hypoglycemic), then do not take them on the morning of your procedure.  You may take them after you have  had the procedure. Do not take aspirin or any aspirin-containing medications, at least eleven (11) days prior to the procedure.  They may prolong bleeding. Wear loose fitting clothing that may be easy to take off and that you would not mind if it got stained with Betadine or blood. Do not wear any jewelry or perfume Remove any nail coloring.  It will interfere with some of our monitoring equipment.  NOTE: Remember that this is not meant to be interpreted as a complete list of all possible complications.  Unforeseen problems may occur.  BLOOD THINNERS The following drugs contain aspirin or other products, which can cause increased bleeding during surgery and should not be taken for 2 weeks prior to and 1 week after surgery.  If you should need take something for relief of minor pain, you may take acetaminophen which is found in Tylenol,m Datril, Anacin-3 and Panadol. It is not blood thinner. The products listed below are.  Do not take any of the products listed below in addition to any listed on your instruction sheet.  A.P.C or A.P.C with Codeine Codeine Phosphate Capsules #3 Ibuprofen Ridaura  ABC compound Congesprin Imuran rimadil  Advil Cope Indocin Robaxisal  Alka-Seltzer Effervescent Pain Reliever and Antacid Coricidin or Coricidin-D  Indomethacin Rufen  Alka-Seltzer plus Cold Medicine Cosprin Ketoprofen S-A-C Tablets  Anacin Analgesic Tablets or Capsules Coumadin Korlgesic Salflex  Anacin Extra Strength Analgesic tablets or capsules CP-2 Tablets Lanoril Salicylate  Anaprox Cuprimine Capsules Levenox Salocol  Anexsia-D Dalteparin Magan Salsalate  Anodynos Darvon compound Magnesium Salicylate Sine-off  Ansaid Dasin Capsules Magsal Sodium Salicylate  Anturane Depen Capsules Marnal Soma  APF Arthritis pain formula Dewitt's Pills Measurin Stanback  Argesic Dia-Gesic Meclofenamic Sulfinpyrazone  Arthritis Bayer Timed Release Aspirin Diclofenac Meclomen Sulindac  Arthritis pain formula  Anacin Dicumarol Medipren Supac  Analgesic (Safety coated) Arthralgen Diffunasal Mefanamic Suprofen  Arthritis Strength Bufferin Dihydrocodeine Mepro Compound Suprol  Arthropan liquid Dopirydamole Methcarbomol with Aspirin Synalgos  ASA tablets/Enseals Disalcid Micrainin Tagament  Ascriptin Doan's Midol Talwin  Ascriptin A/D Dolene Mobidin Tanderil  Ascriptin Extra Strength Dolobid Moblgesic Ticlid  Ascriptin with Codeine Doloprin or Doloprin with Codeine Momentum Tolectin  Asperbuf Duoprin Mono-gesic Trendar  Aspergum Duradyne Motrin or Motrin IB Triminicin  Aspirin plain, buffered or enteric coated Durasal Myochrisine Trigesic  Aspirin Suppositories Easprin Nalfon Trillsate  Aspirin with Codeine Ecotrin Regular or Extra Strength Naprosyn Uracel  Atromid-S Efficin Naproxen Ursinus  Auranofin Capsules Elmiron Neocylate Vanquish  Axotal Emagrin Norgesic Verin  Azathioprine Empirin or Empirin with Codeine Normiflo Vitamin E  Azolid Emprazil Nuprin Voltaren  Bayer Aspirin  plain, buffered or children's or timed BC Tablets or powders Encaprin Orgaran Warfarin Sodium  Buff-a-Comp Enoxaparin Orudis Zorpin  Buff-a-Comp with Codeine Equegesic Os-Cal-Gesic   Buffaprin Excedrin plain, buffered or Extra Strength Oxalid   Bufferin Arthritis Strength Feldene Oxphenbutazone   Bufferin plain or Extra Strength Feldene Capsules Oxycodone with Aspirin   Bufferin with Codeine Fenoprofen Fenoprofen Pabalate or Pabalate-SF   Buffets II Flogesic Panagesic   Buffinol plain or Extra Strength Florinal or Florinal with Codeine Panwarfarin   Buf-Tabs Flurbiprofen Penicillamine   Butalbital Compound Four-way cold tablets Penicillin   Butazolidin Fragmin Pepto-Bismol   Carbenicillin Geminisyn Percodan   Carna Arthritis Reliever Geopen Persantine   Carprofen Gold's salt Persistin   Chloramphenicol Goody's Phenylbutazone   Chloromycetin Haltrain Piroxlcam   Clmetidine heparin Plaquenil   Cllnoril Hyco-pap  Ponstel   Clofibrate Hydroxy chloroquine Propoxyphen         Before stopping any of these medications, be sure to consult the physician who ordered them.  Some, such as Coumadin (Warfarin) are ordered to prevent or treat serious conditions such as "deep thrombosis", "pumonary embolisms", and other heart problems.  The amount of time that you may need off of the medication may also vary with the medication and the reason for which you were taking it.  If you are taking any of these medications, please make sure you notify your pain physician before you undergo any procedures.         Facet Joint Block The facet joints connect the bones of the spine (vertebrae). They make it possible for you to bend, twist, and make other movements with your spine. They also prevent you from overbending, overtwisting, and making other excessive movements.  A facet joint block is a procedure where a numbing medicine (anesthetic) is injected into a facet joint. Often, a type of anti-inflammatory medicine called a steroid is also injected. A facet joint block may be done for two reasons:   Diagnosis. A facet joint block may be done as a test to see whether neck or back pain is caused by a worn-down or infected facet joint. If the pain gets better after a facet joint block, it means the pain is probably coming from the facet joint. If the pain does not get better, it means the pain is probably not coming from the facet joint.   Therapy. A facet joint block may be done to relieve neck or back pain caused by a facet joint. A facet joint block is only done as a therapy if the pain does not improve with medicine, exercise programs, physical therapy, and other forms of pain management. LET [facility] CARE PROVIDER KNOW ABOUT:   Any allergies you have.   All medicines you are taking, including vitamins, herbs, eyedrops, and over-the-counter medicines and creams.   Previous problems you or members of your family  have had with the use of anesthetics.   Any blood disorders you have had.   Other health problems you have. RISKS AND COMPLICATIONS Generally, having a facet joint block is safe. However, as with any procedure, complications can occur. Possible complications associated with having a facet joint block include:   Bleeding.   Injury to a nerve near the injection site.   Pain at the injection site.   Weakness or numbness in areas controlled by nerves near the injection site.   Infection.   Temporary fluid retention.   Allergic reaction to anesthetics or medicines used during the procedure. BEFORE THE PROCEDURE  Follow your health care provider's instructions if you are taking dietary supplements or medicines. You may need to stop taking them or reduce your dosage.   Do not take any new dietary supplements or medicines without asking your health care provider first.   Follow your health care provider's instructions about eating and drinking before the procedure. You may need to stop eating and drinking several hours before the procedure.   Arrange to have an adult drive you home after the procedure. PROCEDURE  You may need to remove your clothing and dress in an open-back gown so that your health care provider can access your spine.   The procedure will be done while you are lying on an X-ray table. Most of the time you will be asked to lie on your stomach, but you may be asked to lie in a different position if an injection will be made in your neck.   Special machines will be used to monitor your oxygen levels, heart rate, and blood pressure.   If an injection will be made in your neck, an intravenous (IV) tube will be inserted into one of your veins. Fluids and medicine will flow directly into your body through the IV tube.   The area over the facet joint where the injection will be made will be cleaned with an antiseptic soap. The surrounding skin will be covered  with sterile drapes.   An anesthetic will be applied to your skin to make the injection area numb. You may feel a temporary stinging or burning sensation.   A video X-ray machine will be used to locate the joint. A contrast dye may be injected into the facet joint area to help with locating the joint.   When the joint is located, an anesthetic medicine will be injected into the joint through the needle.   Your health care provider will ask you whether you feel pain relief. If you do feel relief, a steroid may be injected to provide pain relief for a longer period of time. If you do not feel relief or feel only partial relief, additional injections of an anesthetic may be made in other facet joints.   The needle will be removed, the skin will be cleansed, and bandages will be applied.  AFTER THE PROCEDURE   You will be observed for 15-30 minutes before being allowed to go home. Do not drive. Have an adult drive you or take a taxi or public transportation instead.   If you feel pain relief, the pain will return in several hours or days when the anesthetic wears off.   You may feel pain relief 2-14 days after the procedure. The amount of time this relief lasts varies from person to person.   It is normal to feel some tenderness over the injected area(s) for 2 days following the procedure.   If you have diabetes, you may have a temporary increase in blood sugar.   This information is not intended to replace advice given to you by your health care provider. Make sure you discuss any questions you have with your health care provider.   Document Released: 06/10/2006 Document Revised: 02/09/2014 Document Reviewed: 11/09/2011 Elsevier Interactive Patient Education 2016 [employer] Epidural Steroid Injection An epidural steroid injection is given to relieve pain in your neck, back, or legs that is caused by the irritation or swelling of a nerve root. This procedure involves injecting  a steroid and numbing medicine (anesthetic) into the epidural space. The epidural space  is the space between the outer covering of your spinal cord and the bones that form your backbone (vertebra).  LET [facility] CARE PROVIDER KNOW ABOUT:   Any allergies you have.  All medicines you are taking, including vitamins, herbs, eye drops, creams, and over-the-counter medicines such as aspirin.  Previous problems you or members of your family have had with the use of anesthetics.  Any blood disorders or blood clotting disorders you have.  Previous surgeries you have had.  Medical conditions you have. RISKS AND COMPLICATIONS Generally, this is a safe procedure. However, as with any procedure, complications can occur. Possible complications of epidural steroid injection include:  Headache.  Bleeding.  Infection.  Allergic reaction to the medicines.  Damage to your nerves. The response to this procedure depends on the underlying cause of the pain and its duration. People who have long-term (chronic) pain are less likely to benefit from epidural steroids than are those people whose pain comes on strong and suddenly. BEFORE THE PROCEDURE   Ask your health care provider about changing or stopping your regular medicines. You may be advised to stop taking blood-thinning medicines a few days before the procedure.  You may be given medicines to reduce anxiety.  Arrange for someone to take you home after the procedure. PROCEDURE   You will remain awake during the procedure. You may receive medicine to make you relaxed.  You will be asked to lie on your stomach.  The injection site will be cleaned.  The injection site will be numbed with a medicine (local anesthetic).  A needle will be injected through your skin into the epidural space.  Your health care provider will use an X-ray machine to ensure that the steroid is delivered closest to the affected nerve. You may have minimal  discomfort at this time.  Once the needle is in the right position, the local anesthetic and the steroid will be injected into the epidural space.  The needle will then be removed and a bandage will be applied to the injection site. AFTER THE PROCEDURE   You may be monitored for a short time before you go home.  You may feel weakness or numbness in your arm or leg, which disappears within hours.  You may be allowed to eat, drink, and take your regular medicine.  You may have soreness at the site of the injection.   This information is not intended to replace advice given to you by your health care provider. Make sure you discuss any questions you have with your health care provider.   Document Released: 04/28/2007 Document Revised: 09/21/2012 Document Reviewed: 07/08/2012 Elsevier Interactive Patient Education [employer].

## 2015-11-13 ENCOUNTER — Encounter: Payer: Self-pay | Admitting: Pain Medicine

## 2015-11-13 ENCOUNTER — Ambulatory Visit: Payer: Medicare Other | Attending: Pain Medicine | Admitting: Pain Medicine

## 2015-11-13 VITALS — BP 152/90 | HR 72 | Temp 98.1°F | Resp 18 | Ht 73.0 in | Wt 245.0 lb

## 2015-11-13 DIAGNOSIS — M792 Neuralgia and neuritis, unspecified: Secondary | ICD-10-CM

## 2015-11-13 DIAGNOSIS — E1142 Type 2 diabetes mellitus with diabetic polyneuropathy: Secondary | ICD-10-CM

## 2015-11-13 DIAGNOSIS — Z5181 Encounter for therapeutic drug level monitoring: Secondary | ICD-10-CM | POA: Diagnosis not present

## 2015-11-13 DIAGNOSIS — Z6832 Body mass index (BMI) 32.0-32.9, adult: Secondary | ICD-10-CM | POA: Insufficient documentation

## 2015-11-13 DIAGNOSIS — N400 Enlarged prostate without lower urinary tract symptoms: Secondary | ICD-10-CM | POA: Insufficient documentation

## 2015-11-13 DIAGNOSIS — G4486 Cervicogenic headache: Secondary | ICD-10-CM

## 2015-11-13 DIAGNOSIS — Z87442 Personal history of urinary calculi: Secondary | ICD-10-CM | POA: Insufficient documentation

## 2015-11-13 DIAGNOSIS — F1190 Opioid use, unspecified, uncomplicated: Secondary | ICD-10-CM

## 2015-11-13 DIAGNOSIS — R51 Headache: Secondary | ICD-10-CM | POA: Diagnosis not present

## 2015-11-13 DIAGNOSIS — Z87891 Personal history of nicotine dependence: Secondary | ICD-10-CM | POA: Diagnosis not present

## 2015-11-13 DIAGNOSIS — E785 Hyperlipidemia, unspecified: Secondary | ICD-10-CM | POA: Insufficient documentation

## 2015-11-13 DIAGNOSIS — K219 Gastro-esophageal reflux disease without esophagitis: Secondary | ICD-10-CM | POA: Insufficient documentation

## 2015-11-13 DIAGNOSIS — F329 Major depressive disorder, single episode, unspecified: Secondary | ICD-10-CM | POA: Diagnosis not present

## 2015-11-13 DIAGNOSIS — M25511 Pain in right shoulder: Secondary | ICD-10-CM | POA: Diagnosis not present

## 2015-11-13 DIAGNOSIS — M542 Cervicalgia: Secondary | ICD-10-CM | POA: Insufficient documentation

## 2015-11-13 DIAGNOSIS — R209 Unspecified disturbances of skin sensation: Secondary | ICD-10-CM

## 2015-11-13 DIAGNOSIS — G894 Chronic pain syndrome: Secondary | ICD-10-CM

## 2015-11-13 DIAGNOSIS — Z91013 Allergy to seafood: Secondary | ICD-10-CM | POA: Diagnosis not present

## 2015-11-13 DIAGNOSIS — Z96641 Presence of right artificial hip joint: Secondary | ICD-10-CM | POA: Insufficient documentation

## 2015-11-13 DIAGNOSIS — Z794 Long term (current) use of insulin: Secondary | ICD-10-CM | POA: Diagnosis not present

## 2015-11-13 DIAGNOSIS — I129 Hypertensive chronic kidney disease with stage 1 through stage 4 chronic kidney disease, or unspecified chronic kidney disease: Secondary | ICD-10-CM | POA: Diagnosis not present

## 2015-11-13 DIAGNOSIS — M25512 Pain in left shoulder: Secondary | ICD-10-CM

## 2015-11-13 DIAGNOSIS — Z823 Family history of stroke: Secondary | ICD-10-CM | POA: Insufficient documentation

## 2015-11-13 DIAGNOSIS — G8220 Paraplegia, unspecified: Secondary | ICD-10-CM | POA: Diagnosis not present

## 2015-11-13 DIAGNOSIS — M545 Low back pain: Secondary | ICD-10-CM | POA: Insufficient documentation

## 2015-11-13 DIAGNOSIS — Z8249 Family history of ischemic heart disease and other diseases of the circulatory system: Secondary | ICD-10-CM | POA: Diagnosis not present

## 2015-11-13 DIAGNOSIS — G8929 Other chronic pain: Secondary | ICD-10-CM

## 2015-11-13 DIAGNOSIS — Z79891 Long term (current) use of opiate analgesic: Secondary | ICD-10-CM

## 2015-11-13 DIAGNOSIS — Z91041 Radiographic dye allergy status: Secondary | ICD-10-CM | POA: Diagnosis not present

## 2015-11-13 DIAGNOSIS — Z809 Family history of malignant neoplasm, unspecified: Secondary | ICD-10-CM | POA: Insufficient documentation

## 2015-11-13 MED ORDER — OXYCODONE HCL 10 MG PO TABS: 10.0000 mg | ORAL_TABLET | Freq: Four times a day (QID) | ORAL | 0 refills | Status: DC | PRN

## 2015-11-13 MED ORDER — PREGABALIN 75 MG PO CAPS: 75.0000 mg | ORAL_CAPSULE | Freq: Two times a day (BID) | ORAL | 0 refills | Status: DC

## 2015-11-13 NOTE — Patient Instructions (Signed)
GENERAL RISKS AND COMPLICATIONS ° °What are the risk, side effects and possible complications? °Generally speaking, most procedures are safe.  However, with any procedure there are risks, side effects, and the possibility of complications.  The risks and complications are dependent upon the sites that are lesioned, or the type of nerve block to be performed.  The closer the procedure is to the spine, the more serious the risks are.  Great care is taken when placing the radio frequency needles, block needles or lesioning probes, but sometimes complications can occur. °Infection: Any time there is an injection through the skin, there is a risk of infection.  This is why sterile conditions are used for these blocks.  There are four possible types of infection. °Localized skin infection. °Central Nervous System Infection-This can be in the form of Meningitis, which can be deadly. °Epidural Infections-This can be in the form of an epidural abscess, which can cause pressure inside of the spine, causing compression of the spinal cord with subsequent paralysis. This would require an emergency surgery to decompress, and there are no guarantees that the patient would recover from the paralysis. °Discitis-This is an infection of the intervertebral discs.  It occurs in about 1% of discography procedures.  It is difficult to treat and it may lead to surgery. ° °      2. Pain: the needles have to go through skin and soft tissues, will cause soreness. °      3. Damage to internal structures:  The nerves to be lesioned may be near blood vessels or   ° other nerves which can be potentially damaged. °      4. Bleeding: Bleeding is more common if the patient is taking blood thinners such as  aspirin, Coumadin, Ticiid, Plavix, etc., or if he/she have some genetic predisposition  such as hemophilia. Bleeding into the spinal canal can cause compression of the spinal  cord with subsequent paralysis.  This would require an emergency  surgery to  decompress and there are no guarantees that the patient would recover from the  paralysis. °      5. Pneumothorax:  Puncturing of a lung is a possibility, every time a needle is introduced in  the area of the chest or upper back.  Pneumothorax refers to free air around the  collapsed lung(s), inside of the thoracic cavity (chest cavity).  Another two possible  complications related to a similar event would include: Hemothorax and Chylothorax.   These are variations of the Pneumothorax, where instead of air around the collapsed  lung(s), you may have blood or chyle, respectively. °      6. Spinal headaches: They may occur with any procedures in the area of the spine. °      7. Persistent CSF (Cerebro-Spinal Fluid) leakage: This is a rare problem, but may occur  with prolonged intrathecal or epidural catheters either due to the formation of a fistulous  track or a dural tear. °      8. Nerve damage: By working so close to the spinal cord, there is always a possibility of  nerve damage, which could be as serious as a permanent spinal cord injury with  paralysis. °      9. Death:  Although rare, severe deadly allergic reactions known as "Anaphylactic  reaction" can occur to any of the medications used. °     10. Worsening of the symptoms:  We can always make thing worse. ° °What are the chances   of something like this happening? °Chances of any of this occuring are extremely low.  By statistics, you have more of a chance of getting killed in a motor vehicle accident: while driving to the hospital than any of the above occurring .  Nevertheless, you should be aware that they are possibilities.  In general, it is similar to taking a shower.  Everybody knows that you can slip, hit your head and get killed.  Does that mean that you should not shower again?  Nevertheless always keep in mind that statistics do not mean anything if you happen to be on the wrong side of them.  Even if a procedure has a 1 (one) in a  1,000,000 (million) chance of going wrong, it you happen to be that one..Also, keep in mind that by statistics, you have more of a chance of having something go wrong when taking medications. ° °Who should not have this procedure? °If you are on a blood thinning medication (e.g. Coumadin, Plavix, see list of "Blood Thinners"), or if you have an active infection going on, you should not have the procedure.  If you are taking any blood thinners, please inform your physician. ° °How should I prepare for this procedure? °Do not eat or drink anything at least six hours prior to the procedure. °Bring a driver with you .  It cannot be a taxi. °Come accompanied by an adult that can drive you back, and that is strong enough to help you if your legs get weak or numb from the local anesthetic. °Take all of your medicines the morning of the procedure with just enough water to swallow them. °If you have diabetes, make sure that you are scheduled to have your procedure done first thing in the morning, whenever possible. °If you have diabetes, take only half of your insulin dose and notify our nurse that you have done so as soon as you arrive at the clinic. °If you are diabetic, but only take blood sugar pills (oral hypoglycemic), then do not take them on the morning of your procedure.  You may take them after you have had the procedure. °Do not take aspirin or any aspirin-containing medications, at least eleven (11) days prior to the procedure.  They may prolong bleeding. °Wear loose fitting clothing that may be easy to take off and that you would not mind if it got stained with Betadine or blood. °Do not wear any jewelry or perfume °Remove any nail coloring.  It will interfere with some of our monitoring equipment. ° °NOTE: Remember that this is not meant to be interpreted as a complete list of all possible complications.  Unforeseen problems may occur. ° °BLOOD THINNERS °The following drugs contain aspirin or other products,  which can cause increased bleeding during surgery and should not be taken for 2 weeks prior to and 1 week after surgery.  If you should need take something for relief of minor pain, you may take acetaminophen which is found in Tylenol,m Datril, Anacin-3 and Panadol. It is not blood thinner. The products listed below are.  Do not take any of the products listed below in addition to any listed on your instruction sheet. ° °A.P.C or A.P.C with Codeine Codeine Phosphate Capsules #3 Ibuprofen Ridaura  °ABC compound Congesprin Imuran rimadil  °Advil Cope Indocin Robaxisal  °Alka-Seltzer Effervescent Pain Reliever and Antacid Coricidin or Coricidin-D ° Indomethacin Rufen  °Alka-Seltzer plus Cold Medicine Cosprin Ketoprofen S-A-C Tablets  °Anacin Analgesic Tablets or Capsules Coumadin   Korlgesic Salflex  Anacin Extra Strength Analgesic tablets or capsules CP-2 Tablets Lanoril Salicylate  Anaprox Cuprimine Capsules Levenox Salocol  Anexsia-D Dalteparin Magan Salsalate  Anodynos Darvon compound Magnesium Salicylate Sine-off  Ansaid Dasin Capsules Magsal Sodium Salicylate  Anturane Depen Capsules Marnal Soma  APF Arthritis pain formula Dewitt's Pills Measurin Stanback  Argesic Dia-Gesic Meclofenamic Sulfinpyrazone  Arthritis Bayer Timed Release Aspirin Diclofenac Meclomen Sulindac  Arthritis pain formula Anacin Dicumarol Medipren Supac  Analgesic (Safety coated) Arthralgen Diffunasal Mefanamic Suprofen  Arthritis Strength Bufferin Dihydrocodeine Mepro Compound Suprol  Arthropan liquid Dopirydamole Methcarbomol with Aspirin Synalgos  ASA tablets/Enseals Disalcid Micrainin Tagament  Ascriptin Doan's Midol Talwin  Ascriptin A/D Dolene Mobidin Tanderil  Ascriptin Extra Strength Dolobid Moblgesic Ticlid  Ascriptin with Codeine Doloprin or Doloprin with Codeine Momentum Tolectin  Asperbuf Duoprin Mono-gesic Trendar  Aspergum Duradyne Motrin or Motrin IB Triminicin  Aspirin plain, buffered or enteric coated  Durasal Myochrisine Trigesic  Aspirin Suppositories Easprin Nalfon Trillsate  Aspirin with Codeine Ecotrin Regular or Extra Strength Naprosyn Uracel  Atromid-S Efficin Naproxen Ursinus  Auranofin Capsules Elmiron Neocylate Vanquish  Axotal Emagrin Norgesic Verin  Azathioprine Empirin or Empirin with Codeine Normiflo Vitamin E  Azolid Emprazil Nuprin Voltaren  Bayer Aspirin plain, buffered or children's or timed BC Tablets or powders Encaprin Orgaran Warfarin Sodium  Buff-a-Comp Enoxaparin Orudis Zorpin  Buff-a-Comp with Codeine Equegesic Os-Cal-Gesic   Buffaprin Excedrin plain, buffered or Extra Strength Oxalid   Bufferin Arthritis Strength Feldene Oxphenbutazone   Bufferin plain or Extra Strength Feldene Capsules Oxycodone with Aspirin   Bufferin with Codeine Fenoprofen Fenoprofen Pabalate or Pabalate-SF   Buffets II Flogesic Panagesic   Buffinol plain or Extra Strength Florinal or Florinal with Codeine Panwarfarin   Buf-Tabs Flurbiprofen Penicillamine   Butalbital Compound Four-way cold tablets Penicillin   Butazolidin Fragmin Pepto-Bismol   Carbenicillin Geminisyn Percodan   Carna Arthritis Reliever Geopen Persantine   Carprofen Gold's salt Persistin   Chloramphenicol Goody's Phenylbutazone   Chloromycetin Haltrain Piroxlcam   Clmetidine heparin Plaquenil   Cllnoril Hyco-pap Ponstel   Clofibrate Hydroxy chloroquine Propoxyphen         Before stopping any of these medications, be sure to consult the physician who ordered them.  Some, such as Coumadin (Warfarin) are ordered to prevent or treat serious conditions such as "deep thrombosis", "pumonary embolisms", and other heart problems.  The amount of time that you may need off of the medication may also vary with the medication and the reason for which you were taking it.  If you are taking any of these medications, please make sure you notify your pain physician before you undergo any procedures.         Facet Joint  Block The facet joints connect the bones of the spine (vertebrae). They make it possible for you to bend, twist, and make other movements with your spine. They also prevent you from overbending, overtwisting, and making other excessive movements.  A facet joint block is a procedure where a numbing medicine (anesthetic) is injected into a facet joint. Often, a type of anti-inflammatory medicine called a steroid is also injected. A facet joint block may be done for two reasons:   Diagnosis. A facet joint block may be done as a test to see whether neck or back pain is caused by a worn-down or infected facet joint. If the pain gets better after a facet joint block, it means the pain is probably coming from the facet joint. If the pain does not get better,  it means the pain is probably not coming from the facet joint.   Therapy. A facet joint block may be done to relieve neck or back pain caused by a facet joint. A facet joint block is only done as a therapy if the pain does not improve with medicine, exercise programs, physical therapy, and other forms of pain management. LET [facility] CARE PROVIDER KNOW ABOUT:   Any allergies you have.   All medicines you are taking, including vitamins, herbs, eyedrops, and over-the-counter medicines and creams.   Previous problems you or members of your family have had with the use of anesthetics.   Any blood disorders you have had.   Other health problems you have. RISKS AND COMPLICATIONS Generally, having a facet joint block is safe. However, as with any procedure, complications can occur. Possible complications associated with having a facet joint block include:   Bleeding.   Injury to a nerve near the injection site.   Pain at the injection site.   Weakness or numbness in areas controlled by nerves near the injection site.   Infection.   Temporary fluid retention.   Allergic reaction to anesthetics or medicines used during the  procedure. BEFORE THE PROCEDURE   Follow your health care provider's instructions if you are taking dietary supplements or medicines. You may need to stop taking them or reduce your dosage.   Do not take any new dietary supplements or medicines without asking your health care provider first.   Follow your health care provider's instructions about eating and drinking before the procedure. You may need to stop eating and drinking several hours before the procedure.   Arrange to have an adult drive you home after the procedure. PROCEDURE  You may need to remove your clothing and dress in an open-back gown so that your health care provider can access your spine.   The procedure will be done while you are lying on an X-ray table. Most of the time you will be asked to lie on your stomach, but you may be asked to lie in a different position if an injection will be made in your neck.   Special machines will be used to monitor your oxygen levels, heart rate, and blood pressure.   If an injection will be made in your neck, an intravenous (IV) tube will be inserted into one of your veins. Fluids and medicine will flow directly into your body through the IV tube.   The area over the facet joint where the injection will be made will be cleaned with an antiseptic soap. The surrounding skin will be covered with sterile drapes.   An anesthetic will be applied to your skin to make the injection area numb. You may feel a temporary stinging or burning sensation.   A video X-ray machine will be used to locate the joint. A contrast dye may be injected into the facet joint area to help with locating the joint.   When the joint is located, an anesthetic medicine will be injected into the joint through the needle.   Your health care provider will ask you whether you feel pain relief. If you do feel relief, a steroid may be injected to provide pain relief for a longer period of time. If you do not feel  relief or feel only partial relief, additional injections of an anesthetic may be made in other facet joints.   The needle will be removed, the skin will be cleansed, and bandages will be applied.  AFTER THE PROCEDURE   You will be observed for 15-30 minutes before being allowed to go home. Do not drive. Have an adult drive you or take a taxi or public transportation instead.   If you feel pain relief, the pain will return in several hours or days when the anesthetic wears off.   You may feel pain relief 2-14 days after the procedure. The amount of time this relief lasts varies from person to person.   It is normal to feel some tenderness over the injected area(s) for 2 days following the procedure.   If you have diabetes, you may have a temporary increase in blood sugar.   This information is not intended to replace advice given to you by your health care provider. Make sure you discuss any questions you have with your health care provider.   Document Released: 06/10/2006 Document Revised: 02/09/2014 Document Reviewed: 11/09/2011 Elsevier Interactive Patient Education 2016 [employer] Epidural Steroid Injection An epidural steroid injection is given to relieve pain in your neck, back, or legs that is caused by the irritation or swelling of a nerve root. This procedure involves injecting a steroid and numbing medicine (anesthetic) into the epidural space. The epidural space is the space between the outer covering of your spinal cord and the bones that form your backbone (vertebra).  LET [facility] CARE PROVIDER KNOW ABOUT:   Any allergies you have.  All medicines you are taking, including vitamins, herbs, eye drops, creams, and over-the-counter medicines such as aspirin.  Previous problems you or members of your family have had with the use of anesthetics.  Any blood disorders or blood clotting disorders you have.  Previous surgeries you have had.  Medical conditions you  have. RISKS AND COMPLICATIONS Generally, this is a safe procedure. However, as with any procedure, complications can occur. Possible complications of epidural steroid injection include:  Headache.  Bleeding.  Infection.  Allergic reaction to the medicines.  Damage to your nerves. The response to this procedure depends on the underlying cause of the pain and its duration. People who have long-term (chronic) pain are less likely to benefit from epidural steroids than are those people whose pain comes on strong and suddenly. BEFORE THE PROCEDURE   Ask your health care provider about changing or stopping your regular medicines. You may be advised to stop taking blood-thinning medicines a few days before the procedure.  You may be given medicines to reduce anxiety.  Arrange for someone to take you home after the procedure. PROCEDURE   You will remain awake during the procedure. You may receive medicine to make you relaxed.  You will be asked to lie on your stomach.  The injection site will be cleaned.  The injection site will be numbed with a medicine (local anesthetic).  A needle will be injected through your skin into the epidural space.  Your health care provider will use an X-ray machine to ensure that the steroid is delivered closest to the affected nerve. You may have minimal discomfort at this time.  Once the needle is in the right position, the local anesthetic and the steroid will be injected into the epidural space.  The needle will then be removed and a bandage will be applied to the injection site. AFTER THE PROCEDURE   You may be monitored for a short time before you go home.  You may feel weakness or numbness in your arm or leg, which disappears within hours.  You may be allowed to  eat, drink, and take your regular medicine.  You may have soreness at the site of the injection.   This information is not intended to replace advice given to you by your health  care provider. Make sure you discuss any questions you have with your health care provider.   Document Released: 04/28/2007 Document Revised: 09/21/2012 Document Reviewed: 07/08/2012 Elsevier Interactive Patient Education [employer].

## 2015-11-13 NOTE — Progress Notes (Signed)
Safety precautions to be maintained throughout the outpatient stay will include: orient to surroundings, keep bed in low position, maintain call bell within reach at all times, provide assistance with transfer out of bed and ambulation.    pills remaining:  Oxycodone HCL 10mg  35/120  Last filled 10/20/15

## 2015-11-21 ENCOUNTER — Ambulatory Visit (INDEPENDENT_AMBULATORY_CARE_PROVIDER_SITE_OTHER): Payer: Medicare Other | Admitting: Family Medicine

## 2015-11-21 ENCOUNTER — Encounter: Payer: Self-pay | Admitting: Family Medicine

## 2015-11-21 VITALS — BP 138/86 | HR 89 | Temp 98.4°F | Wt 248.2 lb

## 2015-11-21 DIAGNOSIS — E1142 Type 2 diabetes mellitus with diabetic polyneuropathy: Secondary | ICD-10-CM

## 2015-11-21 DIAGNOSIS — Z794 Long term (current) use of insulin: Secondary | ICD-10-CM | POA: Diagnosis not present

## 2015-11-21 DIAGNOSIS — I10 Essential (primary) hypertension: Secondary | ICD-10-CM

## 2015-11-21 DIAGNOSIS — Z125 Encounter for screening for malignant neoplasm of prostate: Secondary | ICD-10-CM | POA: Diagnosis not present

## 2015-11-21 DIAGNOSIS — Z23 Encounter for immunization: Secondary | ICD-10-CM

## 2015-11-21 LAB — COMPREHENSIVE METABOLIC PANEL
ALT: 48 U/L (ref 0–53)
AST: 32 U/L (ref 0–37)
Albumin: 4 g/dL (ref 3.5–5.2)
Alkaline Phosphatase: 74 U/L (ref 39–117)
BUN: 11 mg/dL (ref 6–23)
CALCIUM: 9.5 mg/dL (ref 8.4–10.5)
CHLORIDE: 97 meq/L (ref 96–112)
CO2: 31 meq/L (ref 19–32)
Creatinine, Ser: 0.87 mg/dL (ref 0.40–1.50)
GFR: 111.64 mL/min (ref >60.00)
Glucose, Bld: 223 mg/dL — ABNORMAL HIGH (ref 70–99)
Potassium: 4 mEq/L (ref 3.5–5.1)
Sodium: 136 mEq/L (ref 135–145)
Total Bilirubin: 0.3 mg/dL (ref 0.2–1.2)
Total Protein: 7.1 g/dL (ref 6.0–8.3)

## 2015-11-21 LAB — TOXASSURE SELECT 13 (MW), URINE

## 2015-11-21 LAB — HEMOGLOBIN A1C: HEMOGLOBIN A1C: 9.3 % — AB (ref 4.6–6.5)

## 2015-11-21 LAB — PSA: PSA: 1.36 ng/mL (ref 0.10–4.00)

## 2015-11-21 MED ORDER — ALPRAZOLAM 0.5 MG PO TABS: 0.5000 mg | ORAL_TABLET | Freq: Two times a day (BID) | ORAL | 2 refills | Status: DC | PRN

## 2015-11-21 MED ORDER — LORATADINE 10 MG PO TABS: 10.0000 mg | ORAL_TABLET | Freq: Every day | ORAL | 11 refills | Status: DC

## 2015-11-21 NOTE — Assessment & Plan Note (Signed)
We'll check a PSA today.

## 2015-11-21 NOTE — Assessment & Plan Note (Signed)
Continued issues with this. Claritin resent to pharmacy.

## 2015-11-21 NOTE — Assessment & Plan Note (Signed)
At goal today. We'll continue to monitor at home. Continue current medications.

## 2015-11-21 NOTE — Assessment & Plan Note (Signed)
Does not appear to be at goal. [facility] check an A1c today and determine next step in management following this result.

## 2015-11-21 NOTE — Assessment & Plan Note (Signed)
Stable. Continue Prozac and Xanax.

## 2015-11-21 NOTE — Patient Instructions (Signed)
Nice to see you. We will refill your Xanax. Please continue your Prozac. Please continue your blood pressure medications and diabetes medications. We'll check some lab work today and call you with the results. Please start on the Claritin for allergies.

## 2015-11-21 NOTE — Progress Notes (Signed)
Pre visit review using our clinic review tool, if applicable. No additional management support is needed unless otherwise documented below in the visit note. 

## 2015-11-21 NOTE — Progress Notes (Signed)
  [name], MD Phone: [phone]  Alan Schmidt is a 70 y.o. male who presents today for follow-up.  Anxiety: Patient notes this is good considering the things that have been going on recently. Notes he is to have a procedure through the pain clinic next week and there was some confusion several weeks ago as to when his appointment was. This caused some significant anxiety. He is taking Prozac. Is taking Xanax. No SI. No depression.  HYPERTENSION  Disease Monitoring  Home BP Monitoring notes slightly elevated at times at home Chest pain- no    Dyspnea- no Medications  Compliance-  taking HCTZ, Coreg, valsartan.  Edema- no  DIABETES Disease Monitoring: Blood Sugar ranges-170s to 190s in the morning Polyuria/phagia/dipsia- some polyuria  he is up-to-date on ophthalmology Medications: Compliance- taking Levemir 17 units twice daily, metformin, Victoza, rarely uses NovoLog Hypoglycemic symptoms- rare, he'll eat something and this will improve Foot exams through podiatry.  Allergies: Notes continued issues with nasal congestion. He uses Nasonex. He did not pickup Claritin at his last visit.  Patient additionally wonders about prostate cancer screening. I had a long discussion with him regarding this today.  PMH: Former smoker    ROS see history of present illness  Objective  Physical Exam Vitals:   11/21/15 0921  BP: 138/86  Pulse: 89  Temp: 98.4 F (36.9 C)    BP Readings from Last 3 Encounters:  11/21/15 138/86  11/13/15 (!) 152/90  09/02/15 (!) 164/92   Wt Readings from Last 3 Encounters:  11/21/15 248 lb 3.2 oz (112.6 kg)  11/13/15 245 lb (111.1 kg)  09/02/15 270 lb (122.5 kg)    Physical Exam  Constitutional: He is well-developed, well-nourished, and in no distress.  Cardiovascular: Normal rate, regular rhythm and normal heart sounds.   Pulmonary/Chest: Effort normal and breath sounds normal.  Musculoskeletal: He exhibits no edema.  Neurological: He  is alert. Gait normal.  Skin: Skin is warm and dry.  Psychiatric: Mood and affect normal.     Assessment/Plan: Please see individual problem list.  Allergic rhinitis Continued issues with this. Claritin resent to pharmacy.  Diabetes ([place]) Does not appear to be at goal. [facility] check an A1c today and determine next step in management following this result.  Anxiety and depression Stable. Continue Prozac and Xanax.  Essential hypertension At goal today. We'll continue to monitor at home. Continue current medications.  Prostate cancer screening We'll check a PSA today.   Orders Placed This Encounter  Procedures  . Pneumococcal conjugate vaccine 13-valent  . Flu vaccine HIGH DOSE PF  . Comp Met (CMET)  . HgB A1c  . PSA    Meds ordered this encounter  Medications  . loratadine (CLARITIN) 10 MG tablet    Sig: Take 1 tablet (10 mg total) by mouth daily.    Dispense:  30 tablet    Refill:  11  . ALPRAZolam (XANAX) 0.5 MG tablet    Sig: Take 1 tablet (0.5 mg total) by mouth 2 (two) times daily as needed for anxiety.    Dispense:  60 tablet    Refill:  2    Not to exceed 5 additional fills before 01/13/2016.    # Healthcare maintenance: Flu shot and Prevnar given today.   [name], MD [place]

## 2015-11-26 ENCOUNTER — Ambulatory Visit: Payer: Medicare Other | Admitting: Pain Medicine

## 2015-11-27 ENCOUNTER — Other Ambulatory Visit: Payer: Self-pay | Admitting: Family Medicine

## 2015-12-09 ENCOUNTER — Encounter: Payer: Self-pay | Admitting: Pain Medicine

## 2015-12-09 ENCOUNTER — Ambulatory Visit (HOSPITAL_BASED_OUTPATIENT_CLINIC_OR_DEPARTMENT_OTHER): Payer: Medicare Other | Admitting: Pain Medicine

## 2015-12-09 VITALS — BP 156/88 | HR 95 | Temp 98.1°F | Resp 16

## 2015-12-09 DIAGNOSIS — G8929 Other chronic pain: Secondary | ICD-10-CM

## 2015-12-09 DIAGNOSIS — M25511 Pain in right shoulder: Secondary | ICD-10-CM

## 2015-12-09 DIAGNOSIS — M542 Cervicalgia: Secondary | ICD-10-CM

## 2015-12-09 DIAGNOSIS — M25512 Pain in left shoulder: Secondary | ICD-10-CM

## 2015-12-09 MED ORDER — DEXAMETHASONE SODIUM PHOSPHATE 10 MG/ML IJ SOLN: 10.0000 mg | Freq: Once | INTRAMUSCULAR | Status: DC

## 2015-12-09 MED ORDER — SODIUM CHLORIDE 0.9% FLUSH: 2.0000 mL | Freq: Once | INTRAVENOUS | Status: DC

## 2015-12-09 MED ORDER — ROPIVACAINE HCL 2 MG/ML IJ SOLN: 2.0000 mL | Freq: Once | INTRAMUSCULAR | Status: DC

## 2015-12-09 MED ORDER — FENTANYL CITRATE (PF) 100 MCG/2ML IJ SOLN: 25.0000 ug | INTRAMUSCULAR | Status: DC | PRN

## 2015-12-09 MED ORDER — MIDAZOLAM HCL 5 MG/5ML IJ SOLN: 1.0000 mg | INTRAMUSCULAR | Status: DC | PRN

## 2015-12-09 MED ORDER — LACTATED RINGERS IV SOLN: 1000.0000 mL | Freq: Once | INTRAVENOUS | Status: DC

## 2015-12-09 MED ORDER — LIDOCAINE HCL (PF) 1 % IJ SOLN: 10.0000 mL | Freq: Once | INTRAMUSCULAR | Status: DC

## 2015-12-09 NOTE — Progress Notes (Signed)
Safety precautions to be maintained throughout the outpatient stay will include: orient to surroundings, keep bed in low position, maintain call bell within reach at all times, provide assistance with transfer out of bed and ambulation.  

## 2015-12-09 NOTE — Progress Notes (Signed)
The patient did not bring a driver and it had to be rescheduled.

## 2015-12-09 NOTE — Patient Instructions (Signed)
Epidural Steroid Injection Patient Information  Description: The epidural space surrounds the nerves as they exit the spinal cord.  In some patients, the nerves can be compressed and inflamed by a bulging disc or a tight spinal canal (spinal stenosis).  By injecting steroids into the epidural space, we can bring irritated nerves into direct contact with a potentially helpful medication.  These steroids act directly on the irritated nerves and can reduce swelling and inflammation which often leads to decreased pain.  Epidural steroids may be injected anywhere along the spine and from the neck to the low back depending upon the location of your pain.   After numbing the skin with local anesthetic (like Novocaine), a small needle is passed into the epidural space slowly.  You may experience a sensation of pressure while this is being done.  The entire block usually last less than 10 minutes.  Conditions which may be treated by epidural steroids:   Low back and leg pain  Neck and arm pain  Spinal stenosis  Post-laminectomy syndrome  Herpes zoster (shingles) pain  Pain from compression fractures  Preparation for the injection:  1. Do not eat any solid food or dairy products within 8 hours of your appointment.  2. You may drink clear liquids up to 3 hours before appointment.  Clear liquids include water, black coffee, juice or soda.  No milk or cream please. 3. You may take your regular medication, including pain medications, with a sip of water before your appointment  Diabetics should hold regular insulin (if taken separately) and take 1/2 normal NPH dos the morning of the procedure.  Carry some sugar containing items with you to your appointment. 4. A driver must accompany you and be prepared to drive you home after your procedure.  5. Bring all your current medications with your. 6. An IV may be inserted and sedation may be given at the discretion of the physician.   7. A blood pressure  cuff, EKG and other monitors will often be applied during the procedure.  Some patients may need to have extra oxygen administered for a short period. 8. You will be asked to provide medical information, including your allergies, prior to the procedure.  We must know immediately if you are taking blood thinners (like Coumadin/Warfarin)  Or if you are allergic to IV iodine contrast (dye). We must know if you could possible be pregnant.  Possible side-effects:  Bleeding from needle site  Infection (rare, may require surgery)  Nerve injury (rare)  Numbness & tingling (temporary)  Difficulty urinating (rare, temporary)  Spinal headache ( a headache worse with upright posture)  Light -headedness (temporary)  Pain at injection site (several days)  Decreased blood pressure (temporary)  Weakness in arm/leg (temporary)  Pressure sensation in back/neck (temporary)  Call if you experience:  Fever/chills associated with headache or increased back/neck pain.  Headache worsened by an upright position.  New onset weakness or numbness of an extremity below the injection site  Hives or difficulty breathing (go to the emergency room)  Inflammation or drainage at the infection site  Severe back/neck pain  Any new symptoms which are concerning to you  Please note:  Although the local anesthetic injected can often make your back or neck feel good for several hours after the injection, the pain will likely return.  It takes 3-7 days for steroids to work in the epidural space.  You may not notice any pain relief for at least that one week.    If effective, we will often do a series of three injections spaced 3-6 weeks apart to maximally decrease your pain.  After the initial series, we generally will wait several months before considering a repeat injection of the same type.  If you have any questions, please call [phone] [facility] 

## 2015-12-09 NOTE — Progress Notes (Signed)
Patient wants to receive moderate sedation for this procedure. He did not bring a driver. Prefers to reschedule [facility]. Instructed to bring someone who can drive him home and to not eat 8 hours before the procedure.

## 2015-12-16 ENCOUNTER — Telehealth: Payer: Self-pay | Admitting: Pain Medicine

## 2015-12-16 ENCOUNTER — Ambulatory Visit: Payer: Medicare Other | Admitting: Pain Medicine

## 2015-12-16 NOTE — Telephone Encounter (Signed)
Patient has appt scheduled for 11-15 and wants to know if he can get this for lower back right sided pain ?

## 2015-12-16 NOTE — Telephone Encounter (Signed)
Called patient at new number that was given.  [phone].  No answer and answering machine voice mail was not set up.  Spoke with Angie and she states that patient did not need a prior auth, so his procedure could be changed if needed when he arrives if necessary.

## 2015-12-16 NOTE — Telephone Encounter (Signed)
Called patient to get specifics of back pain.  Voicemail left for him to give [place] a call.

## 2015-12-18 ENCOUNTER — Ambulatory Visit
Admission: RE | Admit: 2015-12-18 | Discharge: 2015-12-18 | Disposition: A | Discharge status: Home / Self Care | Payer: Medicare Other | Source: Ambulatory Visit | Attending: Pain Medicine | Admitting: Pain Medicine

## 2015-12-18 ENCOUNTER — Ambulatory Visit (HOSPITAL_BASED_OUTPATIENT_CLINIC_OR_DEPARTMENT_OTHER): Payer: Medicare Other | Admitting: Pain Medicine

## 2015-12-18 ENCOUNTER — Encounter: Payer: Self-pay | Admitting: Pain Medicine

## 2015-12-18 VITALS — BP 129/67 | HR 63 | Temp 96.4°F | Resp 16 | Ht 73.0 in | Wt 250.0 lb

## 2015-12-18 DIAGNOSIS — R51 Headache: Secondary | ICD-10-CM | POA: Diagnosis not present

## 2015-12-18 DIAGNOSIS — G8929 Other chronic pain: Secondary | ICD-10-CM | POA: Insufficient documentation

## 2015-12-18 DIAGNOSIS — G4486 Cervicogenic headache: Secondary | ICD-10-CM

## 2015-12-18 DIAGNOSIS — R55 Syncope and collapse: Secondary | ICD-10-CM

## 2015-12-18 DIAGNOSIS — M5412 Radiculopathy, cervical region: Secondary | ICD-10-CM | POA: Diagnosis not present

## 2015-12-18 DIAGNOSIS — M5481 Occipital neuralgia: Secondary | ICD-10-CM | POA: Diagnosis not present

## 2015-12-18 DIAGNOSIS — M542 Cervicalgia: Secondary | ICD-10-CM | POA: Diagnosis not present

## 2015-12-18 DIAGNOSIS — Z91013 Allergy to seafood: Secondary | ICD-10-CM | POA: Insufficient documentation

## 2015-12-18 MED ORDER — ROPIVACAINE HCL 2 MG/ML IJ SOLN: 2.0000 mL | Freq: Once | INTRAMUSCULAR | Status: DC

## 2015-12-18 MED ORDER — ROPIVACAINE HCL 2 MG/ML IJ SOLN
INTRAMUSCULAR | Status: AC
  Administered 2015-12-18: 11:00:00
  Filled 2015-12-18: qty 10

## 2015-12-18 MED ORDER — DEXAMETHASONE SODIUM PHOSPHATE 10 MG/ML IJ SOLN
INTRAMUSCULAR | Status: AC
  Administered 2015-12-18: 11:00:00
  Filled 2015-12-18: qty 1

## 2015-12-18 MED ORDER — MIDAZOLAM HCL 5 MG/5ML IJ SOLN
INTRAMUSCULAR | Status: AC
  Administered 2015-12-18: 2 mg
  Filled 2015-12-18: qty 5

## 2015-12-18 MED ORDER — MIDAZOLAM HCL 5 MG/5ML IJ SOLN: 1.0000 mg | INTRAMUSCULAR | Status: DC | PRN

## 2015-12-18 MED ORDER — LIDOCAINE HCL (PF) 1 % IJ SOLN: 10.0000 mL | Freq: Once | INTRAMUSCULAR | Status: DC

## 2015-12-18 MED ORDER — LIDOCAINE HCL (PF) 1 % IJ SOLN
INTRAMUSCULAR | Status: AC
  Administered 2015-12-18: 11:00:00
  Filled 2015-12-18: qty 5

## 2015-12-18 MED ORDER — FENTANYL CITRATE (PF) 100 MCG/2ML IJ SOLN
INTRAMUSCULAR | Status: AC
  Filled 2015-12-18: qty 2

## 2015-12-18 MED ORDER — SODIUM CHLORIDE 0.9 % IJ SOLN
INTRAMUSCULAR | Status: AC
  Administered 2015-12-18: 11:00:00
  Filled 2015-12-18: qty 10

## 2015-12-18 MED ORDER — LACTATED RINGERS IV SOLN: 1000.0000 mL | Freq: Once | INTRAVENOUS | Status: DC

## 2015-12-18 MED ORDER — EPHEDRINE SULFATE 50 MG/ML IJ SOLN
INTRAMUSCULAR | Status: AC
  Filled 2015-12-18: qty 1

## 2015-12-18 MED ORDER — GLYCOPYRROLATE 0.2 MG/ML IJ SOLN
INTRAMUSCULAR | Status: AC
  Administered 2015-12-18: 0.2 mg via INTRAVENOUS
  Filled 2015-12-18: qty 1

## 2015-12-18 MED ORDER — IOPAMIDOL (ISOVUE-M 200) INJECTION 41%
INTRAMUSCULAR | Status: DC
Start: 2015-12-18 — End: 2015-12-18
  Filled 2015-12-18: qty 10

## 2015-12-18 MED ORDER — SODIUM CHLORIDE 0.9% FLUSH: 2.0000 mL | Freq: Once | INTRAVENOUS | Status: DC

## 2015-12-18 MED ORDER — EPHEDRINE 5 MG/ML INJ
50.0000 mg | Freq: Once | INTRAVENOUS | Status: DC
  Filled 2015-12-18: qty 10

## 2015-12-18 MED ORDER — FENTANYL CITRATE (PF) 100 MCG/2ML IJ SOLN: 25.0000 ug | INTRAMUSCULAR | Status: DC | PRN

## 2015-12-18 MED ORDER — DEXAMETHASONE SODIUM PHOSPHATE 10 MG/ML IJ SOLN: 10.0000 mg | Freq: Once | INTRAMUSCULAR | Status: DC

## 2015-12-18 NOTE — Progress Notes (Signed)
Safety precautions to be maintained throughout the outpatient stay will include: orient to surroundings, keep bed in low position, maintain call bell within reach at all times, provide assistance with transfer out of bed and ambulation.  

## 2015-12-18 NOTE — Progress Notes (Signed)
Patient's Name: Alan Schmidt  MRN: [id number]  Referring Provider: Milinda Pointer, MD  DOB: [birth year]  PCP: [name], MD  DOS: 12/18/2015  Note by: Kathlen Brunswick. Dossie Arbour, MD  Service setting: Ambulatory outpatient  Location: [facility] (AMB) Pain Management Facility  Visit type: Procedure  Specialty: Interventional Pain Management  Patient type: Established   Primary Reason for Visit: Interventional Pain Management Treatment. CC: Neck Pain  Procedure:  Anesthesia, Analgesia, Anxiolysis:  Type: Diagnostic, Inter-Laminar, Epidural Steroid Injection Region: Posterior Cervico-thoracic Region Level: C7-T1 Laterality: Left-Sided Paramedial  Type: Local Anesthesia with Moderate (Conscious) Sedation Local Anesthetic: Lidocaine 1% Route: Intravenous (IV) IV Access: Secured Sedation: Meaningful verbal contact was maintained at all times during the procedure  Indication(s): Analgesia and Anxiety  Indications: 1. Chronic cervical radicular pain (Left)   2. Chronic shoulder radicular pain (Left)   3. Chronic neck pain (midline over the C7 spinous processes) (L>R)   4. Cervicogenic headache (Left)   5. Occipital neuralgia (Left)   6. History of Vasovagal response to spinal injections    Pain Score: Pre-procedure: 4 /10 Post-procedure: 0-No pain/10  Pre-Procedure Assessment:  Alan Schmidt is a 70 y.o. (year old), male patient, seen today for interventional treatment. He  has a past surgical history that includes Total hip arthroplasty; Tonsillectomy; and right hip surgery.. His primarily concern today is the Neck Pain The primary encounter diagnosis was Chronic cervical radicular pain (Left). 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  Pain Type: Chronic pain Pain  Location: Neck Pain Descriptors / Indicators: Aching, Dull Pain Frequency: Intermittent   Cause of the patient's prior history of vasovagal reactions to spinal stimulation, we have treated treated him with 0.2 mg of glycopyrrolate (Robinul). In addition, we had available ephedrine 5 mg per mL, in the event that he showed any bradycardia or hypertension.  Date of Last Visit: 11/21/15 Service Provided on Last Visit: Med Refill  Coagulation Parameters No results found for: INR, LABPROT, APTT, PLT Verification of the correct person, correct site (including marking of site), and correct procedure were performed and confirmed by the patient.  Consent: Before the procedure and under the influence of no sedative(s), amnesic(s), or anxiolytics, the patient was informed of the treatment options, risks and possible complications. To fulfill our ethical and legal obligations, as recommended by the American Medical Association's Code of Ethics, I have informed the patient of my clinical impression; the nature and purpose of the treatment or procedure; the risks, benefits, and possible complications of the intervention; the alternatives, including doing nothing; the risk(s) and benefit(s) of the alternative treatment(s) or procedure(s); and the risk(s) and benefit(s) of doing nothing. The patient was provided information about the general risks and possible complications associated with the procedure. These may include, but are not limited to: failure to achieve desired goals, infection, bleeding, organ or nerve damage, allergic reactions, paralysis, and death. In addition, the patient was informed of those risks and complications associated to Spine-related procedures, such as failure to decrease pain; infection (i.e.: Meningitis, epidural or intraspinal abscess); bleeding (i.e.: epidural hematoma, subarachnoid hemorrhage, or any other type of intraspinal or peri-dural bleeding); organ or nerve damage (i.e.: Any  type of peripheral nerve, nerve root, or spinal cord injury) with subsequent damage to sensory, motor, and/or autonomic systems, resulting in permanent pain, numbness, and/or weakness of  one or several areas of the body; allergic reactions; (i.e.: anaphylactic reaction); and/or death. Furthermore, the patient was informed of those risks and complications associated with the medications. These include, but are not limited to: allergic reactions (i.e.: anaphylactic or anaphylactoid reaction(s)); adrenal axis suppression; blood sugar elevation that in diabetics may result in ketoacidosis or comma; water retention that in patients with history of congestive heart failure may result in shortness of breath, pulmonary edema, and decompensation with resultant heart failure; weight gain; swelling or edema; medication-induced neural toxicity; particulate matter embolism and blood vessel occlusion with resultant organ, and/or nervous system infarction; and/or aseptic necrosis of one or more joints. Finally, the patient was informed that Medicine is not an exact science; therefore, there is also the possibility of unforeseen or unpredictable risks and/or possible complications that may result in a catastrophic outcome. The patient indicated having understood very clearly. We have given the patient no guarantees and we have made no promises. Enough time was given to the patient to ask questions, all of which were answered to the patient's satisfaction. Alan Schmidt has indicated that he wanted to continue with the procedure.  Consent Attestation: I, the ordering provider, attest that I have discussed with the patient the benefits, risks, side-effects, alternatives, likelihood of achieving goals, and potential problems during recovery for the procedure that I have provided informed consent.  Pre-Procedure Preparation:  Safety Precautions: Allergies reviewed. The patient was asked about blood thinners, or active  infections, both of which were denied. The patient was asked to confirm the procedure and laterality, before marking the site, and again before commencing the procedure. Appropriate site, procedure, and patient were confirmed by following the Joint Commission's Universal Protocol (UP.01.01.01), in the form of a "Time Out". The patient was asked to participate by confirming the accuracy of the "Time Out" information. Patient was assessed for positional comfort and pressure points before starting the procedure. Allergies: He is allergic to contrast media [iodinated diagnostic agents]; iodine; and shellfish allergy. Allergy Precautions: No radiological contrast used Infection Control Precautions: Sterile technique used. Standard Universal Precautions were taken as recommended by the Department of [facility] for Disease Control and Prevention (CDC). Standard pre-surgical skin prep was conducted. Respiratory hygiene and cough etiquette was practiced. Hand hygiene observed. Safe injection practices and needle disposal techniques followed. SDV (single dose vial) medications used. Medications properly checked for expiration dates and contaminants. Personal protective equipment (PPE) used as per protocol. Monitoring:  As per clinic protocol. Vitals:   12/18/15 1141 12/18/15 1151 12/18/15 1201 12/18/15 1211  BP: 128/76 140/85 131/71 129/67  Pulse: 61 69 66 63  Resp: 14 14 16 16   Temp:  (!) 96.4 F (35.8 C)    TempSrc:      SpO2: 95% 99% 97% 99%  Weight:      Height:      Calculated BMI: Body mass index is 32.98 kg/m. Time-out: "Time-out" completed before starting procedure, as per protocol.  Description of Procedure Process:   Time-out: "Time-out" completed before starting procedure, as per protocol. Position: Prone with head of the table was raised to facilitate breathing. Target Area: For Epidural Steroid injections the target is the interlaminar space, initially targeting the lower border  of the superior vertebral body lamina. Approach: Paramedial approach. Area Prepped: Entire PosteriorCervical Region Prepping solution: ChloraPrep (2% chlorhexidine gluconate and 70% isopropyl alcohol) Safety Precautions: Aspiration looking for blood return was conducted prior to all injections. At no point did we inject any substances, as a  needle was being advanced. No attempts were made at seeking any paresthesias. Safe injection practices and needle disposal techniques used. Medications properly checked for expiration dates. SDV (single dose vial) medications used. Description of the Procedure: Protocol guidelines were followed. The procedure needle was introduced through the skin, ipsilateral to the reported pain, and advanced to the target area. Bone was contacted and the needle walked caudad, until the lamina was cleared. The epidural space was identified using "loss-of-resistance technique" with 2-3 ml of PF-NaCl (0.9% NSS), in a 5cc LOR glass syringe. EBL: None Materials & Medications:  Needle(s) Used: 20g - 10cm, Tuohy-style epidural needle Medication(s): see below.  Imaging Guidance (Spinal):  Type of Imaging Technique: Fluoroscopy Guidance (Spinal) Indication(s): Assistance in needle guidance and placement for procedures requiring needle placement in or near specific anatomical locations not easily accessible without such assistance. Exposure Time: Please see nurses notes. Contrast: None used. Fluoroscopic Guidance: I was personally present during the use of fluoroscopy. "Tunnel Vision Technique" used to obtain the best possible view of the target area. Parallax error corrected before commencing the procedure. "Direction-depth-direction" technique used to introduce the needle under continuous pulsed fluoroscopy. Once target was reached, antero-posterior, oblique, and lateral fluoroscopic projection used confirm needle placement in all planes. Images permanently stored in  EMR. Interpretation: No contrast injected. I personally interpreted the imaging intraoperatively. Adequate needle placement confirmed in multiple planes. Permanent images saved into the patient's record.  Antibiotic Prophylaxis:  Indication(s): No indications identified. Type:  Antibiotics Given (last 72 hours)    None      Post-operative Assessment:  Complications: No immediate post-treatment complications observed by team, or reported by patient. Disposition: The patient tolerated the entire procedure well. A repeat set of vitals were taken after the procedure and the patient was kept under observation following institutional policy, for this type of procedure. Post-procedural neurological assessment was performed, showing return to baseline, prior to discharge. The patient was provided with post-procedure discharge instructions, including a section on how to identify potential problems. Should any problems arise concerning this procedure, the patient was given instructions to immediately contact us, at any time, without hesitation. In any case, we plan to contact the patient by telephone for a follow-up status report regarding this interventional procedure. Comments:  The patient did not experience any symptoms associated to a vasovagal response with the pretreatment provided.  Plan of Care  Discharge to: Discharge home  Medications ordered for procedure: Meds ordered this encounter  Medications  . glycopyrrolate (ROBINUL) 0.2 MG/ML injection    Donneta Romberg, Dena: cabinet override  . lidocaine (PF) (XYLOCAINE) 1 % injection    GARNER, CYNTHIA: cabinet override  . sodium chloride 0.9 % injection    GARNER, CYNTHIA: cabinet override  . ropivacaine (PF) 2 mg/ml (0.2%) (NAROPIN) 2 MG/ML epidural    GARNER, CYNTHIA: cabinet override  . fentaNYL (SUBLIMAZE) 100 MCG/2ML injection    GARNER, CYNTHIA: cabinet override  . dexamethasone (DECADRON) 10 MG/ML injection    GARNER, CYNTHIA: cabinet  override  . midazolam (VERSED) 5 MG/5ML injection    GARNER, CYNTHIA: cabinet override  . DISCONTD: iopamidol (ISOVUE-M) 41 % intrathecal injection    GARNER, CYNTHIA: cabinet override  . ePHEDrine 50 MG/ML injection    GARNER, CYNTHIA: cabinet override  . fentaNYL (SUBLIMAZE) injection 25-50 mcg    Make sure Narcan is available in the pyxis when using this medication. In the event of respiratory depression (RR< 8/min): Titrate NARCAN (naloxone) in increments of 0.1 to 0.2 mg IV at  2-3 minute intervals, until desired degree of reversal.  . lactated ringers infusion 1,000 mL  . midazolam (VERSED) 5 MG/5ML injection 1-2 mg    Make sure Flumazenil is available in the pyxis when using this medication. If oversedation occurs, administer 0.2 mg IV over 15 sec. If after 45 sec no response, administer 0.2 mg again over 1 min; may repeat at 1 min intervals; not to exceed 4 doses (1 mg)  . dexamethasone (DECADRON) injection 10 mg  . lidocaine (PF) (XYLOCAINE) 1 % injection 10 mL  . sodium chloride flush (NS) 0.9 % injection 2 mL  . ropivacaine (PF) 2 mg/ml (0.2%) (NAROPIN) epidural 2 mL  . ePHEDrine injection 50 mg    Indications: Bradycardia   Medications administered: (For more details, see medical record) We administered glycopyrrolate, lidocaine (PF), sodium chloride, ropivacaine (PF) 2 mg/ml (0.2%), dexamethasone, and midazolam. Lab-work, Procedure(s), & Referral(s) Ordered: Orders Placed This Encounter  Procedures  . Cervical Epidural Injection  . DG C-Arm 1-60 Min-No Report   Imaging Ordered: No results found for this or any previous visit. New Prescriptions   No medications on file   Physician-requested Follow-up:  Return in about 2 weeks (around 01/01/2016) for Post-Procedure evaluation.  Future Appointments Date Time Provider Funk  01/20/2016 10:45 AM Milinda Pointer, MD [facility] None  02/13/2016 11:15 AM Milinda Pointer, MD [facility] None   Primary Care  Physician: [name], MD Location: [facility] Out[facility] Management Facility Note by: Kathlen Brunswick. Dossie Arbour, M.D, DABA, DABAPM, DABPM, DABIPP, FIPP  Disclaimer:  Medicine is not an exact science. The only guarantee in medicine is that nothing is guaranteed. It is important to note that the decision to proceed with this intervention was based on the information collected from the patient. The Data and conclusions were drawn from the patient's questionnaire, the interview, and the physical examination. Because the information was provided in large part by the patient, it cannot be guaranteed that it has not been purposely or unconsciously manipulated. Every effort has been made to obtain as much relevant data as possible for this evaluation. It is important to note that the conclusions that lead to this procedure are derived in large part from the available data. Always take into account that the treatment will also be dependent on availability of resources and existing treatment guidelines, considered by other Pain Management Practitioners as being common knowledge and practice, at the time of the intervention. For Medico-Legal purposes, it is also important to point out that variation in procedural techniques and pharmacological choices are the acceptable norm. The indications, contraindications, technique, and results of the above procedure should only be interpreted and judged by a Board-Certified Interventional Pain Specialist with extensive familiarity and expertise in the same exact procedure and technique. Attempts at providing opinions without similar or greater experience and expertise than that of the treating physician will be considered as inappropriate and unethical, and shall result in a formal complaint to the state medical board and applicable specialty societies.  Instructions provided at this appointment: Patient Instructions  Pain Management Discharge Instructions  General  Discharge Instructions :  If you need to reach your doctor call: Monday-Friday 8:00 am - 4:00 pm at [phone] or toll free [phone].  After clinic hours [phone] to have operator reach doctor.  Bring all of your medication bottles to all your appointments in the pain clinic.  To cancel or reschedule your appointment with Pain Management please remember to call 24 hours in advance to avoid a  fee.  Refer to the educational materials which you have been given on: General Risks, I had my Procedure. Discharge Instructions, Post Sedation.  Post Procedure Instructions:  The drugs you were given will stay in your system until tomorrow, so for the next 24 hours you should not drive, make any legal decisions or drink any alcoholic beverages.  You may eat anything you prefer, but it is better to start with liquids then soups and crackers, and gradually work up to solid foods.  Please notify your doctor immediately if you have any unusual bleeding, trouble breathing or pain that is not related to your normal pain.  Depending on the type of procedure that was done, some parts of your body may feel week and/or numb.  This usually clears up by tonight or the next day.  Walk with the use of an assistive device or accompanied by an adult for the 24 hours.  You may use ice on the affected area for the first 24 hours.  Put ice in a Ziploc bag and cover with a towel and place against area 15 minutes on 15 minutes off.  You may switch to heat after 24 hours.Epidural Steroid Injection An epidural steroid injection is a shot of steroid medicine and numbing medicine that is given into the space between the spinal cord and the bones in your back (epidural space). The shot helps relieve pain caused by an irritated or swollen nerve root. The amount of pain relief you get from the injection depends on what is causing the nerve to be swollen and irritated, and how long your pain lasts. You are more likely to  benefit from this injection if your pain is strong and comes on suddenly rather than if you have had pain for a long time. Tell a health care provider about:  Any allergies you have.  All medicines you are taking, including vitamins, herbs, eye drops, creams, and over-the-counter medicines.  Any problems you or family members have had with anesthetic medicines.  Any blood disorders you have.  Any surgeries you have had.  Any medical conditions you have.  Whether you are pregnant or may be pregnant. What are the risks? Generally, this is a safe procedure. However, problems may occur, including:  Headache.  Bleeding.  Infection.  Allergic reaction to medicines.  Damage to your nerves. What happens before the procedure? Staying hydrated  Follow instructions from your health care provider about hydration, which may include:  Up to 2 hours before the procedure - you may continue to drink clear liquids, such as water, clear fruit juice, black coffee, and plain tea. Eating and drinking restrictions  Follow instructions from your health care provider about eating and drinking, which may include:  8 hours before the procedure - stop eating heavy meals or foods such as meat, fried foods, or fatty foods.  6 hours before the procedure - stop eating light meals or foods, such as toast or cereal.  6 hours before the procedure - stop drinking milk or drinks that contain milk.  2 hours before the procedure - stop drinking clear liquids. Medicine  You may be given medicines to lower anxiety.  Ask your health care provider about:  Changing or stopping your regular medicines. This is especially important if you are taking diabetes medicines or blood thinners.  Taking medicines such as aspirin and ibuprofen. These medicines can thin your blood. Do not take these medicines before your procedure if your health care provider instructs you  not to. General instructions  Plan to have  someone take you home from the hospital or clinic. What happens during the procedure?  You may receive a medicine to help you relax (sedative).  You will be asked to lie on your abdomen.  The injection site will be cleaned.  A numbing medicine (local anesthetic) will be used to numb the injection site.  A needle will be inserted through your skin into the epidural space. You may feel some discomfort when this happens. An X-ray machine will be used to make sure the needle is put as close as possible to the affected nerve.  A steroid medicine and a local anesthetic will be injected into the epidural space.  The needle will be removed.  A bandage (dressing) will be put over the injection site. What happens after the procedure?  Your blood pressure, heart rate, breathing rate, and blood oxygen level will be monitored until the medicines you were given have worn off.  Your arm or leg may feel weak or numb for a few hours.  The injection site may feel sore.  Do not drive for 24 hours if you received a sedative. This information is not intended to replace advice given to you by your health care provider. Make sure you discuss any questions you have with your health care provider. Document Released: 04/28/2007 Document Revised: 07/03/2015 Document Reviewed: 05/07/2015 Elsevier Interactive Patient Education  2017 [employer].

## 2015-12-18 NOTE — Patient Instructions (Signed)
Pain Management Discharge Instructions  General Discharge Instructions :  If you need to reach your doctor call: Monday-Friday 8:00 am - 4:00 pm at [phone] or toll free [phone].  After clinic hours [phone] to have operator reach doctor.  Bring all of your medication bottles to all your appointments in the pain clinic.  To cancel or reschedule your appointment with Pain Management please remember to call 24 hours in advance to avoid a fee.  Refer to the educational materials which you have been given on: General Risks, I had my Procedure. Discharge Instructions, Post Sedation.  Post Procedure Instructions:  The drugs you were given will stay in your system until tomorrow, so for the next 24 hours you should not drive, make any legal decisions or drink any alcoholic beverages.  You may eat anything you prefer, but it is better to start with liquids then soups and crackers, and gradually work up to solid foods.  Please notify your doctor immediately if you have any unusual bleeding, trouble breathing or pain that is not related to your normal pain.  Depending on the type of procedure that was done, some parts of your body may feel week and/or numb.  This usually clears up by tonight or the next day.  Walk with the use of an assistive device or accompanied by an adult for the 24 hours.  You may use ice on the affected area for the first 24 hours.  Put ice in a Ziploc bag and cover with a towel and place against area 15 minutes on 15 minutes off.  You may switch to heat after 24 hours.Epidural Steroid Injection An epidural steroid injection is a shot of steroid medicine and numbing medicine that is given into the space between the spinal cord and the bones in your back (epidural space). The shot helps relieve pain caused by an irritated or swollen nerve root. The amount of pain relief you get from the injection depends on what is causing the nerve to be swollen and irritated,  and how long your pain lasts. You are more likely to benefit from this injection if your pain is strong and comes on suddenly rather than if you have had pain for a long time. Tell a health care provider about:  Any allergies you have.  All medicines you are taking, including vitamins, herbs, eye drops, creams, and over-the-counter medicines.  Any problems you or family members have had with anesthetic medicines.  Any blood disorders you have.  Any surgeries you have had.  Any medical conditions you have.  Whether you are pregnant or may be pregnant. What are the risks? Generally, this is a safe procedure. However, problems may occur, including:  Headache.  Bleeding.  Infection.  Allergic reaction to medicines.  Damage to your nerves. What happens before the procedure? Staying hydrated  Follow instructions from your health care provider about hydration, which may include:  Up to 2 hours before the procedure - you may continue to drink clear liquids, such as water, clear fruit juice, black coffee, and plain tea. Eating and drinking restrictions  Follow instructions from your health care provider about eating and drinking, which may include:  8 hours before the procedure - stop eating heavy meals or foods such as meat, fried foods, or fatty foods.  6 hours before the procedure - stop eating light meals or foods, such as toast or cereal.  6 hours before the procedure - stop drinking milk or drinks that contain milk.    2 hours before the procedure - stop drinking clear liquids. Medicine  You may be given medicines to lower anxiety.  Ask your health care provider about:  Changing or stopping your regular medicines. This is especially important if you are taking diabetes medicines or blood thinners.  Taking medicines such as aspirin and ibuprofen. These medicines can thin your blood. Do not take these medicines before your procedure if your health care provider instructs  you not to. General instructions  Plan to have someone take you home from the hospital or clinic. What happens during the procedure?  You may receive a medicine to help you relax (sedative).  You will be asked to lie on your abdomen.  The injection site will be cleaned.  A numbing medicine (local anesthetic) will be used to numb the injection site.  A needle will be inserted through your skin into the epidural space. You may feel some discomfort when this happens. An X-ray machine will be used to make sure the needle is put as close as possible to the affected nerve.  A steroid medicine and a local anesthetic will be injected into the epidural space.  The needle will be removed.  A bandage (dressing) will be put over the injection site. What happens after the procedure?  Your blood pressure, heart rate, breathing rate, and blood oxygen level will be monitored until the medicines you were given have worn off.  Your arm or leg may feel weak or numb for a few hours.  The injection site may feel sore.  Do not drive for 24 hours if you received a sedative. This information is not intended to replace advice given to you by your health care provider. Make sure you discuss any questions you have with your health care provider. Document Released: 04/28/2007 Document Revised: 07/03/2015 Document Reviewed: 05/07/2015 [employer] Interactive Patient Education  2017 [employer]  

## 2015-12-19 ENCOUNTER — Telehealth: Payer: Self-pay | Admitting: *Deleted

## 2015-12-19 NOTE — Telephone Encounter (Signed)
No problems post procedure. 

## 2015-12-20 ENCOUNTER — Telehealth: Payer: Self-pay

## 2015-12-20 NOTE — Telephone Encounter (Signed)
received a call from patient stating he had a headache in the front of his head and wanted to know what he could take. Patient denies fever, redness or swelling at injection site.  Denies headache changing with position change.  States he woke up this morning with headache.  Informed patient that he could take what he normally takes for a headache. Informed patient that he should go to the ED for any fever, blurred vision or any other symptoms that concerned him.

## 2016-01-02 ENCOUNTER — Other Ambulatory Visit: Payer: Self-pay | Admitting: Family Medicine

## 2016-01-07 ENCOUNTER — Other Ambulatory Visit: Payer: Self-pay | Admitting: Family Medicine

## 2016-01-20 ENCOUNTER — Ambulatory Visit: Payer: Medicare Other | Admitting: Pain Medicine

## 2016-01-25 ENCOUNTER — Other Ambulatory Visit: Payer: Self-pay | Admitting: Family Medicine

## 2016-02-13 ENCOUNTER — Encounter: Payer: Self-pay | Admitting: Pain Medicine

## 2016-02-13 ENCOUNTER — Ambulatory Visit: Payer: Medicare Other | Attending: Pain Medicine | Admitting: Pain Medicine

## 2016-02-13 VITALS — BP 114/67 | HR 58 | Temp 97.7°F | Resp 16 | Ht 73.0 in | Wt 250.0 lb

## 2016-02-13 DIAGNOSIS — Z5181 Encounter for therapeutic drug level monitoring: Secondary | ICD-10-CM | POA: Diagnosis not present

## 2016-02-13 DIAGNOSIS — Z794 Long term (current) use of insulin: Secondary | ICD-10-CM | POA: Insufficient documentation

## 2016-02-13 DIAGNOSIS — M5442 Lumbago with sciatica, left side: Secondary | ICD-10-CM | POA: Diagnosis not present

## 2016-02-13 DIAGNOSIS — Z7982 Long term (current) use of aspirin: Secondary | ICD-10-CM | POA: Diagnosis not present

## 2016-02-13 DIAGNOSIS — F419 Anxiety disorder, unspecified: Secondary | ICD-10-CM | POA: Insufficient documentation

## 2016-02-13 DIAGNOSIS — E1142 Type 2 diabetes mellitus with diabetic polyneuropathy: Secondary | ICD-10-CM | POA: Insufficient documentation

## 2016-02-13 DIAGNOSIS — R55 Syncope and collapse: Secondary | ICD-10-CM | POA: Diagnosis not present

## 2016-02-13 DIAGNOSIS — M1288 Other specific arthropathies, not elsewhere classified, other specified site: Secondary | ICD-10-CM

## 2016-02-13 DIAGNOSIS — M47816 Spondylosis without myelopathy or radiculopathy, lumbar region: Secondary | ICD-10-CM

## 2016-02-13 DIAGNOSIS — G894 Chronic pain syndrome: Secondary | ICD-10-CM | POA: Insufficient documentation

## 2016-02-13 DIAGNOSIS — M5441 Lumbago with sciatica, right side: Secondary | ICD-10-CM

## 2016-02-13 DIAGNOSIS — M79604 Pain in right leg: Secondary | ICD-10-CM | POA: Diagnosis not present

## 2016-02-13 DIAGNOSIS — K219 Gastro-esophageal reflux disease without esophagitis: Secondary | ICD-10-CM | POA: Insufficient documentation

## 2016-02-13 DIAGNOSIS — J309 Allergic rhinitis, unspecified: Secondary | ICD-10-CM | POA: Insufficient documentation

## 2016-02-13 DIAGNOSIS — H9201 Otalgia, right ear: Secondary | ICD-10-CM | POA: Insufficient documentation

## 2016-02-13 DIAGNOSIS — M25512 Pain in left shoulder: Secondary | ICD-10-CM | POA: Diagnosis not present

## 2016-02-13 DIAGNOSIS — Z79899 Other long term (current) drug therapy: Secondary | ICD-10-CM | POA: Diagnosis not present

## 2016-02-13 DIAGNOSIS — F1190 Opioid use, unspecified, uncomplicated: Secondary | ICD-10-CM

## 2016-02-13 DIAGNOSIS — Z79891 Long term (current) use of opiate analgesic: Secondary | ICD-10-CM | POA: Diagnosis not present

## 2016-02-13 DIAGNOSIS — M5412 Radiculopathy, cervical region: Secondary | ICD-10-CM | POA: Insufficient documentation

## 2016-02-13 DIAGNOSIS — G8220 Paraplegia, unspecified: Secondary | ICD-10-CM | POA: Insufficient documentation

## 2016-02-13 DIAGNOSIS — R51 Headache: Secondary | ICD-10-CM | POA: Insufficient documentation

## 2016-02-13 DIAGNOSIS — I129 Hypertensive chronic kidney disease with stage 1 through stage 4 chronic kidney disease, or unspecified chronic kidney disease: Secondary | ICD-10-CM | POA: Diagnosis not present

## 2016-02-13 DIAGNOSIS — G8929 Other chronic pain: Secondary | ICD-10-CM

## 2016-02-13 DIAGNOSIS — F329 Major depressive disorder, single episode, unspecified: Secondary | ICD-10-CM | POA: Diagnosis not present

## 2016-02-13 DIAGNOSIS — N400 Enlarged prostate without lower urinary tract symptoms: Secondary | ICD-10-CM | POA: Insufficient documentation

## 2016-02-13 DIAGNOSIS — M792 Neuralgia and neuritis, unspecified: Secondary | ICD-10-CM

## 2016-02-13 DIAGNOSIS — M25511 Pain in right shoulder: Secondary | ICD-10-CM | POA: Insufficient documentation

## 2016-02-13 MED ORDER — OXYCODONE HCL 10 MG PO TABS: 10.0000 mg | ORAL_TABLET | Freq: Four times a day (QID) | ORAL | 0 refills | Status: DC | PRN

## 2016-02-13 MED ORDER — PREGABALIN 75 MG PO CAPS: 75.0000 mg | ORAL_CAPSULE | Freq: Two times a day (BID) | ORAL | 0 refills | Status: DC

## 2016-02-13 MED ORDER — SCOPOLAMINE 1 MG/3DAYS TD PT72: 1.0000 | MEDICATED_PATCH | Freq: Once | TRANSDERMAL | 0 refills | Status: DC | PRN

## 2016-02-13 NOTE — Progress Notes (Signed)
Patient's Name: Alan Schmidt  MRN: [id number]  Referring Provider: [name], MD  DOB: 22-Sep-1945  PCP: [name], MD  DOS: 02/13/2016  Note by: Kathlen Brunswick. Dossie Arbour, MD  Service setting: Ambulatory outpatient  Specialty: Interventional Pain Management  Location: [facility] (AMB) Pain Management Facility    Patient type: Established   Primary Reason(s) for Visit: Encounter for prescription drug management & post-procedure evaluation of chronic illness with mild to moderate exacerbation(Level of risk: moderate) CC: Back Pain (low and mostly on the right) and Leg Pain (right and posterior)  HPI  Alan Schmidt is a 71 y.o. year old, male patient, who comes today for a post-procedure evaluation and medication management. He has Polyneuropathy ([place]); Paraparesis ([place]); Chronic low back pain (Location of Primary Source of Pain) (Bilateral) (R>L); Lumbar facet syndrome (Bilateral) (R>L); Lumbar spondylosis; Diabetic peripheral neuropathy ([place]); Long term current use of opiate analgesic; Long term prescription opiate use; Opiate use (60 MME/Day); Opiate dependence ([place]); Encounter for therapeutic drug level monitoring; Chronic neck pain (midline over the C7 spinous processes) (L>R); Neurogenic pain; Neuropathic pain; Contrast dye allergy; Chronic lower extremity pain (Location of Secondary source of pain) (Bilateral) (R>L); 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) (Right L5 dermatome); History of total hip replacement (Right); Chronic hip pain (Right); Class I Morbid obesity (HCC) (68% higher incidence of chronic low back pain); Essential hypertension; GERD (gastroesophageal reflux disease); Obstructive sleep apnea; Hyperlipidemia; Chronic kidney disease (CKD); Abnormal nerve conduction studies (severe bilateral lower extremity polyneuropathy); Chronic shoulder pain (Bilateral) (R>L); Occipital neuralgia (Left); Cervicogenic headache (Left); Diabetes ([place]); BPH (benign prostatic hyperplasia);  Right ear pain; History of Vasovagal response to spinal injections; Allergic rhinitis; Anxiety and depression; Disturbance of skin sensation; Prostate cancer screening; Chronic shoulder radicular pain (Left); Chronic cervical radicular pain (Left); and Chronic pain syndrome on his problem list. His primarily concern today is the Back Pain (low and mostly on the right) and Leg Pain (right and posterior)  Pain Assessment: Self-Reported Pain Score: 4 /10 Clinically the patient looks like a 2/10 Reported level is inconsistent with clinical observations. Information on the proper use of the pain score provided to the patient today. Pain Type: Chronic pain Pain Location: Back Pain Orientation: Lower, Right Pain Descriptors / Indicators: Aching, Dull Pain Frequency: Intermittent  [name] was last seen on 12/18/2015 for a procedure. During today's appointment we reviewed Alan Schmidt's post-procedure results, as well as his outpatient medication regimen.  Further details on both, my assessment(s), as well as the proposed treatment plan, please see below.  Controlled Substance Pharmacotherapy Assessment REMS (Risk Evaluation and Mitigation Strategy)  Analgesic:Oxycodone IR 10 mg every 6 hours (40 mg/day) MME/day:60 mg/day.  Angelique Holm, RN  02/13/2016 12:18 PM  Sign at close encounter Nursing Pain Medication Assessment:  Safety precautions to be maintained throughout the outpatient stay will include: orient to surroundings, keep bed in low position, maintain call bell within reach at all times, provide assistance with transfer out of bed and ambulation.  Medication Inspection Compliance: Pill count conducted under aseptic conditions, in front of the patient. Neither the pills nor the bottle was removed from the patient's sight at any time. Once count was completed pills were immediately returned to the patient in their original bottle.  Medication: See above Pill Count: 115 of 120 pills  remain Bottle Appearance: Standard pharmacy container. Clearly labeled. Filled Date: 01 / 09 / 2018 Medication last intake: 02-13-2016 at 1000   Pharmacokinetics: Liberation and absorption (onset of action): WNL  Distribution (time to peak effect): WNL Metabolism and excretion (duration of action): WNL         Pharmacodynamics: Desired effects: Analgesia: Alan Schmidt reports >50% benefit. Functional ability: Patient reports that medication allows him to accomplish basic ADLs Clinically meaningful improvement in function (CMIF): Sustained CMIF goals met Perceived effectiveness: Described as relatively effective, allowing for increase in activities of daily living (ADL) Undesirable effects: Side-effects or Adverse reactions: None reported Monitoring: [place] PMP: Online review of the past 67-monthperiod conducted. Compliant with practice rules and regulations List of all UDS test(s) done:  Lab Results  Component Value Date   TOXASSSELUR FINAL 11/13/2015   TOXASSSELUR FINAL 03/06/2015   [place] 12/05/2014   Last UDS on record: ToxAssure Select 13  Date Value Ref Range Status  11/13/2015 FINAL  Final    Comment:    ==================================================================== TOXASSURE SELECT 13 (MW) ==================================================================== Test                             Result       Flag       Units Drug Present and Declared for Prescription Verification   Alpha-hydroxyalprazolam        73           EXPECTED   ng/mg creat    Alpha-hydroxyalprazolam is an expected metabolite of alprazolam.    Source of alprazolam is a scheduled prescription medication.   Oxycodone                      2098         EXPECTED   ng/mg creat   Noroxycodone                   1701         EXPECTED   ng/mg creat    Sources of oxycodone include scheduled prescription medications.    Noroxycodone is an expected metabolite of  oxycodone. ==================================================================== Test                      Result    Flag   Units      Ref Range   Creatinine              84               mg/dL      >=20 ==================================================================== Declared Medications:  The flagging and interpretation on this report are based on the  following declared medications.  Unexpected results may arise from  inaccuracies in the declared medications.  **Note: The testing scope of this panel includes these medications:  Alprazolam  Oxycodone  **Note: The testing scope of this panel does not include following  reported medications:  Carvedilol (Coreg)  Desoximetasone (Topicort)  Fluoxetine (Prozac)  Hydrochlorothiazide  Hydroxyzine  Insulin (Levemir)  Insulin (NovoLog)  Liraglutide (Victoza)  Loratadine  Magnesium (Mag-Ox)  Metformin (Glucophage)  Mometasone (Nasonex)  Naloxone (Narcan)  Pravastatin (Pravachol)  Pregabalin (Lyrica)  Tamsulosin (Flomax)  Valsartan (Diovan)  Vitamin D2 (Drisdol)  Vitamin D3 ==================================================================== For clinical consultation, please call ([phone] ====================================================================    UDS interpretation: Compliant          Medication Assessment Form: Reviewed. Patient indicates being compliant with therapy Treatment compliance: Compliant Risk Assessment Profile: Aberrant behavior: See prior evaluations. None observed or detected today Comorbid factors increasing risk of overdose: See prior notes. No additional risks detected  today Risk of substance use disorder (SUD): Low Opioid Risk Tool (ORT) Total Score:    Interpretation Table:  Score <3 = Low Risk for SUD  Score between 4-7 = Moderate Risk for SUD  Score >8 = High Risk for Opioid Abuse   Risk Mitigation Strategies:  Patient Counseling: Covered Patient-Prescriber Agreement (PPA):  Present and active  Notification to other healthcare providers: Done  Pharmacologic Plan: No change in therapy, at this time  Post-Procedure Assessment  12/18/2015 Procedure: Left C7-T1 cervical epidural steroid injection under fluoroscopic guidance and IV sedation Post-procedure pain score: 0/10         Influential Factors: BMI: 32.98 kg/m Intra-procedural challenges: None observed Assessment challenges: None detected         Post-procedural side-effects, adverse reactions, or complications: None reported Reported issues: None  Sedation: Sedation provided. When no sedatives are used, the analgesic levels obtained are directly associated to the effectiveness of the local anesthetics. However, when sedation is provided, the level of analgesia obtained during the initial 1 hour following the intervention, is believed to be the result of a combination of factors. These factors may include, but are not limited to: 1. The effectiveness of the local anesthetics used. 2. The effects of the analgesic(s) and/or anxiolytic(s) used. 3. The degree of discomfort experienced by the patient at the time of the procedure. 4. The patients ability and reliability in recalling and recording the events. 5. The presence and influence of possible secondary gains and/or psychosocial factors. Reported result: Relief experienced during the 1st hour after the procedure: 90 % (Ultra-Short Term Relief) Interpretative annotation: Analgesia during this period is likely to be Local Anesthetic and/or IV Sedative (Analgesic/Anxiolitic) related.          Effects of local anesthetic: The analgesic effects attained during this period are directly associated to the localized infiltration of local anesthetics and therefore cary significant diagnostic value as to the etiological location, or anatomical origin, of the pain. Expected duration of relief is directly dependent on the pharmacodynamics of the local anesthetic used.  Long-acting (4-6 hours) anesthetics used.  Reported result: Relief during the next 4 to 6 hour after the procedure: 70 % (Short-Term Relief) Interpretative annotation: Complete relief would suggest area to be the source of the pain.          Long-term benefit: Defined as the period of time past the expected duration of local anesthetics. With the possible exception of prolonged sympathetic blockade from the local anesthetics, benefits during this period are typically attributed to, or associated with, other factors such as analgesic sensory neuropraxia, antiinflammatory effects, or beneficial biochemical changes provided by agents other than the local anesthetics Reported result: Extended relief following procedure: 50 % (continues today) (Long-Term Relief) Interpretative annotation: Good relief. This could suggest inflammation to be a significant component in the etiology to the pain.          Current benefits: Defined as persistent relief that continues at this point in time.   Reported results: Treated area: >50 % Mr. Alegria reports improvement in function Interpretative annotation: Ongoing benefits would suggest effective palliative intervention  Interpretation: Results would suggest a successful palliative intervention.          Laboratory Chemistry  Inflammation Markers Lab Results  Component Value Date   ESRSEDRATE 33 (H) 03/06/2015   CRP 0.5 03/06/2015   Renal Function Lab Results  Component Value Date   BUN 11 11/21/2015   CREATININE 0.87 11/21/2015   GFRAA >60 03/06/2015  GFRNONAA >60 03/06/2015   Hepatic Function Lab Results  Component Value Date   AST 32 11/21/2015   ALT 48 11/21/2015   ALBUMIN 4.0 11/21/2015   Electrolytes Lab Results  Component Value Date   NA 136 11/21/2015   K 4.0 11/21/2015   CL 97 11/21/2015   CALCIUM 9.5 11/21/2015   MG 1.8 03/06/2015   Pain Modulating Vitamins No results found for: [place], VD125OH2TOT, TI1443XV4, MG8676PP5,  25OHVITD1, 25OHVITD2, 25OHVITD3, VITAMINB12 Coagulation Parameters No results found for: INR, LABPROT, APTT, PLT Cardiovascular No results found for: BNP, HGB, HCT Note: Lab results reviewed.  Recent Diagnostic Imaging Review  Dg C-arm 1-60 Min-no Report  Result Date: 01/02/2016 Fluoroscopy was utilized by the requesting physician.  No radiographic interpretation.   Note: Imaging results reviewed.          Meds  The patient has a current medication list which includes the following prescription(s): accu-chek compact plus, accu-chek softclix lancets, alprazolam, aspirin ec, bd pen needle nano u/f, carvedilol, vitamin d3, desoximetasone, fluoxetine, hydrochlorothiazide, hydroxyzine, insulin aspart protamine- aspart, insulin detemir, liraglutide, magnesium oxide, metformin, mometasone, naloxone, nasonex, oxycodone hcl, oxycodone hcl, oxycodone hcl, pravastatin, pregabalin, scopolamine, tamsulosin, valsartan, victoza, and vitamin d (ergocalciferol).  Current Outpatient Prescriptions on File Prior to Visit  Medication Sig  . ACCU-CHEK COMPACT PLUS test strip USE 1 STRIP 7 TIMES DAILY TO TEST  . ACCU-CHEK SOFTCLIX LANCETS lancets USE ONE MISC TWO TIMES DAILY  . ALPRAZolam (XANAX) 0.5 MG tablet Take 1 tablet (0.5 mg total) by mouth 2 (two) times daily as needed for anxiety.  Marland Kitchen aspirin EC 325 MG tablet Take 325 mg by mouth daily.   . BD PEN NEEDLE NANO U/F 32G X 4 MM MISC USE EVERY DAY  . carvedilol (COREG) 25 MG tablet Take 1 tablet (25 mg total) by mouth 2 (two) times daily.  . Cholecalciferol (VITAMIN D3) 2000 units capsule Take 2,000 Units by mouth daily.  Marland Kitchen desoximetasone (TOPICORT) 0.25 % cream APPLY CREAM TO AFFECTED AREA TWO TIMES DAILY  . FLUoxetine (PROZAC) 40 MG capsule TAKE ONE CAPSULE BY MOUTH TWO TIMES DAILY  . hydrochlorothiazide (HYDRODIURIL) 25 MG tablet Take 1 tablet (25 mg total) by mouth daily.  . hydrOXYzine (ATARAX/VISTARIL) 50 MG tablet TAKE 1 TABLET BY MOUTH FOUR TIMES A  DAY AS NEEDED  . insulin aspart protamine- aspart (NOVOLOG MIX 70/30) (70-30) 100 UNIT/ML injection Inject into the skin. Sliding scale  . insulin detemir (LEVEMIR) 100 UNIT/ML injection Inject 17 units into skin in the morning and 17 units into the skin before bedtime  . liraglutide (VICTOZA) 18 MG/3ML SOPN INJECT 18 UNITS UNDER THE SKIN ONCE DAILY  . magnesium oxide (MAG-OX) 400 (241.3 Mg) MG tablet Take 1 tablet (400 mg total) by mouth 2 (two) times daily.  . metFORMIN (GLUCOPHAGE) 1000 MG tablet Take 1 tablet (1,000 mg total) by mouth 2 (two) times daily with a meal.  . mometasone (NASONEX) 50 MCG/ACT nasal spray Place 2 sprays into the nose daily.  . naloxone (NARCAN) 2 MG/2ML injection Inject content of syringe into thigh muscle. Call 911.  Marland Kitchen NASONEX 50 MCG/ACT nasal spray PLACE 2 SPRAYS INTO THE NOSE DAILY.  . pravastatin (PRAVACHOL) 40 MG tablet Take 1 tablet (40 mg total) by mouth daily.  . tamsulosin (FLOMAX) 0.4 MG CAPS capsule Take 1 capsule (0.4 mg total) by mouth 2 (two) times daily.  . valsartan (DIOVAN) 320 MG tablet Take 1 tablet (320 mg total) by mouth daily.  Marland Kitchen VICTOZA 18 MG/3ML  SOPN Inject 0.3 mLs (1.8 mg total) into the skin daily.  . Vitamin D, Ergocalciferol, (DRISDOL) 50000 units CAPS capsule TAKE 1 CAPSULE BY MOUTH 2 TIMES A WEEK   No current facility-administered medications on file prior to visit.    ROS  Constitutional: Denies any fever or chills Gastrointestinal: No reported hemesis, hematochezia, vomiting, or acute GI distress Musculoskeletal: Denies any acute onset joint swelling, redness, loss of ROM, or weakness Neurological: No reported episodes of acute onset apraxia, aphasia, dysarthria, agnosia, amnesia, paralysis, loss of coordination, or loss of consciousness  Allergies  Mr. Loveday is allergic to contrast media [iodinated diagnostic agents]; iodine; and shellfish allergy.  [place]  Drug: Mr. Cain  reports that he does not use drugs. Alcohol:   reports that he does not drink alcohol. Tobacco:  reports that he has quit smoking. He has never used smokeless tobacco. Medical:  has a past medical history of Anxiety; Chronic hip pain (Right) (12/05/2014); Chronic lumbar pain; Depression; Hyperlipidemia; Hypertension; Kidney stones; and Migraines. Family: family history includes Cancer in his mother; Heart disease in his father; Stroke in his father.  Past Surgical History:  Procedure Laterality Date  . right hip surgery     4 surgeries  . TONSILLECTOMY    . TOTAL HIP ARTHROPLASTY     Constitutional Exam  General appearance: Well nourished, well developed, and well hydrated. In no apparent acute distress Vitals:   02/13/16 1202  BP: 114/67  Pulse: (!) 58  Resp: 16  Temp: 97.7 F (36.5 C)  TempSrc: Oral  Weight: 250 lb (113.4 kg)  Height: _0  (1.854 m)   BMI Assessment: Estimated body mass index is 32.98 kg/m as calculated from the following:   Height as of this encounter: _1  (1.854 m).   Weight as of this encounter: 250 lb (113.4 kg).  BMI interpretation table: BMI level Category Range association with higher incidence of chronic pain  <18 kg/m2 Underweight   18.5-24.9 kg/m2 Ideal body weight   25-29.9 kg/m2 Overweight Increased incidence by 20%  30-34.9 kg/m2 Obese (Class I) Increased incidence by 68%  35-39.9 kg/m2 Severe obesity (Class II) Increased incidence by 136%  >40 kg/m2 Extreme obesity (Class III) Increased incidence by 254%   BMI Readings from Last 4 Encounters:  02/13/16 32.98 kg/m  12/18/15 32.98 kg/m  11/21/15 32.75 kg/m  11/13/15 32.32 kg/m   Wt Readings from Last 4 Encounters:  02/13/16 250 lb (113.4 kg)  12/18/15 250 lb (113.4 kg)  11/21/15 248 lb 3.2 oz (112.6 kg)  11/13/15 245 lb (111.1 kg)  Psych/Mental status: Alert, oriented x 3 (person, place, & time) Eyes: PERLA Respiratory: No evidence of acute respiratory distress  Cervical Spine Exam  Inspection: No masses, redness, or  swelling Alignment: Symmetrical Functional ROM: Unrestricted ROM Stability: No instability detected Muscle strength & Tone: Functionally intact Sensory: Unimpaired Palpation: Non-contributory  Upper Extremity (UE) Exam    Side: Right upper extremity  Side: Left upper extremity  Inspection: No masses, redness, swelling, or asymmetry  Inspection: No masses, redness, swelling, or asymmetry  Functional ROM: Unrestricted ROM          Functional ROM: Unrestricted ROM          Muscle strength & Tone: Functionally intact  Muscle strength & Tone: Functionally intact  Sensory: Unimpaired  Sensory: Unimpaired  Palpation: Non-contributory  Palpation: Non-contributory   Thoracic Spine Exam  Inspection: No masses, redness, or swelling Alignment: Symmetrical Functional ROM: Unrestricted ROM Stability: No instability detected Sensory:  Unimpaired Muscle strength & Tone: Functionally intact Palpation: Non-contributory  Lumbar Spine Exam  Inspection: No masses, redness, or swelling Alignment: Symmetrical Functional ROM: Diminished ROM Stability: No instability detected Muscle strength & Tone: Functionally intact Sensory: Movement-associated discomfort Palpation: Complains of area being tender to palpation Provocative Tests: Lumbar Hyperextension and rotation test: Positive on the right for facet joint pain. Patrick's Maneuver: evaluation deferred today              Gait & Posture Assessment  Ambulation: Patient ambulates using a cane Gait: Relatively normal for age and body habitus Posture: WNL   Lower Extremity Exam    Side: Right lower extremity  Side: Left lower extremity  Inspection: No masses, redness, swelling, or asymmetry  Inspection: No masses, redness, swelling, or asymmetry  Functional ROM: Unrestricted ROM          Functional ROM: Unrestricted ROM          Muscle strength & Tone: Functionally intact  Muscle strength & Tone: Functionally intact  Sensory: Unimpaired  Sensory:  Unimpaired  Palpation: Non-contributory  Palpation: Non-contributory   Assessment  Primary Diagnosis & Pertinent Problem List: The primary encounter diagnosis was Chronic low back pain (Location of Primary Source of Pain) (Bilateral) (R>L). Diagnoses of Chronic pain syndrome, Lumbar facet syndrome (Bilateral) (R>L), History of Vasovagal response to spinal injections, Long term prescription opiate use, Opiate use (60 MME/Day), Class I Morbid obesity (HCC) (68% higher incidence of chronic low back pain), Neuropathic pain, and Neurogenic pain were also pertinent to this visit.  Status Diagnosis  Stable Stable Stable 1. Chronic low back pain (Location of Primary Source of Pain) (Bilateral) (R>L)   2. Chronic pain syndrome   3. Lumbar facet syndrome (Bilateral) (R>L)   4. History of Vasovagal response to spinal injections   5. Long term prescription opiate use   6. Opiate use (60 MME/Day)   7. Class I Morbid obesity (HCC) (68% higher incidence of chronic low back pain)   8. Neuropathic pain   9. Neurogenic pain      Plan of Care  Pharmacotherapy (Medications Ordered): Meds ordered this encounter  Medications  . pregabalin (LYRICA) 75 MG capsule    Sig: Take 1 capsule (75 mg total) by mouth every 12 (twelve) hours.    Dispense:  180 capsule    Refill:  0    Do not place this medication, or any other prescription from our practice, on "Automatic Refill". Patient may have prescription filled one day early if pharmacy is closed on scheduled refill date.  . Oxycodone HCl 10 MG TABS    Sig: Take 1 tablet (10 mg total) by mouth every 6 (six) hours as needed.    Dispense:  120 tablet    Refill:  0    Do not place this medication, or any other prescription from our practice, on "Automatic Refill". Patient may have prescription filled one day early if pharmacy is closed on scheduled refill date. Do not fill until: 03/30/16 To last until: 04/29/16  . Oxycodone HCl 10 MG TABS    Sig: Take 1  tablet (10 mg total) by mouth every 6 (six) hours as needed.    Dispense:  120 tablet    Refill:  0    Do not place this medication, or any other prescription from our practice, on "Automatic Refill". Patient may have prescription filled one day early if pharmacy is closed on scheduled refill date. Do not fill until: 02/29/16 To last until: 03/30/16  .  Oxycodone HCl 10 MG TABS    Sig: Take 1 tablet (10 mg total) by mouth every 6 (six) hours as needed.    Dispense:  120 tablet    Refill:  0    Do not place this medication, or any other prescription from our practice, on "Automatic Refill". Patient may have prescription filled one day early if pharmacy is closed on scheduled refill date. Do not fill until: 04/29/16 To last until: 05/29/16  . scopolamine (TRANSDERM-SCOP) 1 MG/3DAYS    Sig: Place 1 patch (1.5 mg total) onto the skin once as needed (apply 24 hrs before procedure).    Dispense:  10 patch    Refill:  0    Instruct patient to apply patch to area behind ear 24 hours before surgical procedure.   New Prescriptions   SCOPOLAMINE (TRANSDERM-SCOP) 1 MG/3DAYS    Place 1 patch (1.5 mg total) onto the skin once as needed (apply 24 hrs before procedure).   Medications administered today: Mr. Pullin had no medications administered during this visit. Lab-work, procedure(s), and/or referral(s): No orders of the defined types were placed in this encounter.  Imaging and/or referral(s): None  Interventional therapies: Planned, scheduled, and/or pending:   Right-sided lumbar facet radiofrequency ablation under fluoroscopic guidance and IV sedation    Considering:   bilateral lumbar facet radiofrequency ablation under fluoroscopic guidance and IV sedation.    Palliative PRN treatment(s):   Left sided lumbar facet radiofrequency ablation under fluoroscopic guidance and IV sedation.    Provider-requested follow-up: Return in about 3 months (around 05/13/2016) for (MD) Med-Mgmt, in  addition, keep previously scheduled appointment for radiofrequency.  Future Appointments Date Time Provider [place]  02/17/2016 10:45 AM Milinda Pointer, MD [facility] None  04/16/2016 10:45 AM [name], MD [facility] None  05/14/2016 10:30 AM Milinda Pointer, MD [facility] None   Primary Care Physician: [name], MD Location: [facility] Outpatient Pain Management Facility Note by: Kathlen Brunswick. Dossie Arbour, M.D, DABA, DABAPM, DABPM, DABIPP, FIPP Date: 02/14/16; Time: 4:54 PM  Pain Score Disclaimer: We use the NRS-11 scale. This is a self-reported, subjective measurement of pain severity with only modest accuracy. It is used primarily to identify changes within a particular patient. It must be understood that outpatient pain scales are significantly less accurate that those used for research, where they can be applied under ideal controlled circumstances with minimal exposure to variables. In reality, the score is likely to be a combination of pain intensity and pain affect, where pain affect describes the degree of emotional arousal or changes in action readiness caused by the sensory experience of pain. Factors such as social and work situation, setting, emotional state, anxiety levels, expectation, and prior pain experience may influence pain perception and show large inter-individual differences that may also be affected by time variables.  Patient instructions provided during this appointment: Patient Instructions  Radiofrequency Lesioning Introduction Radiofrequency lesioning is a procedure that is performed to relieve pain. The procedure is often used for back, neck, or arm pain. Radiofrequency lesioning involves the use of a machine that creates radio waves to make heat. During the procedure, the heat is applied to the nerve that carries the pain signal. The heat damages the nerve and interferes with the pain signal. Pain relief usually starts about 2 weeks after the  procedure and lasts for 6 months to 1 year. Tell a health care provider about:  Any allergies you have.  All medicines you are taking, including vitamins, herbs, eye drops,  creams, and over-the-counter medicines.  Any problems you or family members have had with anesthetic medicines.  Any blood disorders you have.  Any surgeries you have had.  Any medical conditions you have.  Whether you are pregnant or may be pregnant. What are the risks? Generally, this is a safe procedure. However, problems may occur, including:  Pain or soreness at the injection site.  Infection at the injection site.  Damage to nerves or blood vessels. What happens before the procedure?  Ask your health care provider about:  Changing or stopping your regular medicines. This is especially important if you are taking diabetes medicines or blood thinners.  Taking medicines such as aspirin and ibuprofen. These medicines can thin your blood. Do not take these medicines before your procedure if your health care provider instructs you not to.  Follow instructions from your health care provider about eating or drinking restrictions.  Plan to have someone take you home after the procedure.  If you go home right after the procedure, plan to have someone with you for 24 hours. What happens during the procedure?  You will be given one or more of the following:  A medicine to help you relax (sedative).  A medicine to numb the area (local anesthetic).  You will be awake during the procedure. You will need to be able to talk with the health care provider during the procedure.  With the help of a type of X-ray (fluoroscopy), the health care provider will insert a radiofrequency needle into the area to be treated.  Next, a wire that carries the radio waves (electrode) will be put through the radiofrequency needle. An electrical pulse will be sent through the electrode to verify the correct nerve. You will feel a  tingling sensation, and you may have muscle twitching.  Then, the tissue that is around the needle tip will be heated by an electric current that is passed using the radiofrequency machine. This will numb the nerves.  A bandage (dressing) will be put on the insertion area after the procedure is done. The procedure may vary among health care providers and hospitals. What happens after the procedure?  Your blood pressure, heart rate, breathing rate, and blood oxygen level will be monitored often until the medicines you were given have worn off.  Return to your normal activities as directed by your health care provider. This information is not intended to replace advice given to you by your health care provider. Make sure you discuss any questions you have with your health care provider. Document Released: 09/17/2010 Document Revised: 06/27/2015 Document Reviewed: 02/26/2014  2017 [employer]

## 2016-02-13 NOTE — Progress Notes (Signed)
Nursing Pain Medication Assessment:  Safety precautions to be maintained throughout the outpatient stay will include: orient to surroundings, keep bed in low position, maintain call bell within reach at all times, provide assistance with transfer out of bed and ambulation.  Medication Inspection Compliance: Pill count conducted under aseptic conditions, in front of the patient. Neither the pills nor the bottle was removed from the patient's sight at any time. Once count was completed pills were immediately returned to the patient in their original bottle.  Medication: See above Pill Count: 115 of 120 pills remain Bottle Appearance: Standard pharmacy container. Clearly labeled. Filled Date: 01 / 09 / 2018 Medication last intake: 02-13-2016 at 1000

## 2016-02-13 NOTE — Patient Instructions (Signed)
Radiofrequency Lesioning Introduction Radiofrequency lesioning is a procedure that is performed to relieve pain. The procedure is often used for back, neck, or arm pain. Radiofrequency lesioning involves the use of a machine that creates radio waves to make heat. During the procedure, the heat is applied to the nerve that carries the pain signal. The heat damages the nerve and interferes with the pain signal. Pain relief usually starts about 2 weeks after the procedure and lasts for 6 months to 1 year. Tell a health care provider about:  Any allergies you have.  All medicines you are taking, including vitamins, herbs, eye drops, creams, and over-the-counter medicines.  Any problems you or family members have had with anesthetic medicines.  Any blood disorders you have.  Any surgeries you have had.  Any medical conditions you have.  Whether you are pregnant or may be pregnant. What are the risks? Generally, this is a safe procedure. However, problems may occur, including:  Pain or soreness at the injection site.  Infection at the injection site.  Damage to nerves or blood vessels. What happens before the procedure?  Ask your health care provider about:  Changing or stopping your regular medicines. This is especially important if you are taking diabetes medicines or blood thinners.  Taking medicines such as aspirin and ibuprofen. These medicines can thin your blood. Do not take these medicines before your procedure if your health care provider instructs you not to.  Follow instructions from your health care provider about eating or drinking restrictions.  Plan to have someone take you home after the procedure.  If you go home right after the procedure, plan to have someone with you for 24 hours. What happens during the procedure?  You will be given one or more of the following:  A medicine to help you relax (sedative).  A medicine to numb the area (local anesthetic).  You  will be awake during the procedure. You will need to be able to talk with the health care provider during the procedure.  With the help of a type of X-ray (fluoroscopy), the health care provider will insert a radiofrequency needle into the area to be treated.  Next, a wire that carries the radio waves (electrode) will be put through the radiofrequency needle. An electrical pulse will be sent through the electrode to verify the correct nerve. You will feel a tingling sensation, and you may have muscle twitching.  Then, the tissue that is around the needle tip will be heated by an electric current that is passed using the radiofrequency machine. This will numb the nerves.  A bandage (dressing) will be put on the insertion area after the procedure is done. The procedure may vary among health care providers and hospitals. What happens after the procedure?  Your blood pressure, heart rate, breathing rate, and blood oxygen level will be monitored often until the medicines you were given have worn off.  Return to your normal activities as directed by your health care provider. This information is not intended to replace advice given to you by your health care provider. Make sure you discuss any questions you have with your health care provider. Document Released: 09/17/2010 Document Revised: 06/27/2015 Document Reviewed: 02/26/2014  2017 [employer]  

## 2016-02-17 ENCOUNTER — Encounter: Payer: Self-pay | Admitting: Pain Medicine

## 2016-02-17 ENCOUNTER — Ambulatory Visit (HOSPITAL_BASED_OUTPATIENT_CLINIC_OR_DEPARTMENT_OTHER): Payer: Medicare Other | Admitting: Pain Medicine

## 2016-02-17 ENCOUNTER — Ambulatory Visit
Admission: RE | Admit: 2016-02-17 | Discharge: 2016-02-17 | Disposition: A | Discharge status: Home / Self Care | Payer: Medicare Other | Source: Ambulatory Visit | Attending: Pain Medicine | Admitting: Pain Medicine

## 2016-02-17 VITALS — BP 149/70 | HR 74 | Temp 97.7°F | Resp 16 | Ht 73.0 in | Wt 250.0 lb

## 2016-02-17 DIAGNOSIS — M47816 Spondylosis without myelopathy or radiculopathy, lumbar region: Secondary | ICD-10-CM | POA: Insufficient documentation

## 2016-02-17 DIAGNOSIS — G8929 Other chronic pain: Secondary | ICD-10-CM

## 2016-02-17 DIAGNOSIS — M1288 Other specific arthropathies, not elsewhere classified, other specified site: Secondary | ICD-10-CM | POA: Insufficient documentation

## 2016-02-17 DIAGNOSIS — M5441 Lumbago with sciatica, right side: Secondary | ICD-10-CM

## 2016-02-17 DIAGNOSIS — M5442 Lumbago with sciatica, left side: Secondary | ICD-10-CM | POA: Diagnosis not present

## 2016-02-17 MED ORDER — LIDOCAINE HCL (PF) 1 % IJ SOLN
INTRAMUSCULAR | Status: AC
  Administered 2016-02-17: 15:00:00
  Filled 2016-02-17: qty 5

## 2016-02-17 MED ORDER — MIDAZOLAM HCL 5 MG/5ML IJ SOLN
1.0000 mg | INTRAMUSCULAR | Status: DC | PRN
  Administered 2016-02-17: 2 mg via INTRAVENOUS
  Filled 2016-02-17: qty 5

## 2016-02-17 MED ORDER — GLYCOPYRROLATE 0.2 MG/ML IJ SOLN
0.2000 mg | Freq: Once | INTRAMUSCULAR | Status: AC
  Administered 2016-02-17: 0.2 mg via INTRAVENOUS
  Filled 2016-02-17: qty 1

## 2016-02-17 MED ORDER — ROPIVACAINE HCL 2 MG/ML IJ SOLN
9.0000 mL | Freq: Once | INTRAMUSCULAR | Status: AC
Start: 2016-02-17 — End: 2016-02-17
  Administered 2016-02-17: 9 mL via PERINEURAL
  Filled 2016-02-17: qty 10

## 2016-02-17 MED ORDER — TRIAMCINOLONE ACETONIDE 40 MG/ML IJ SUSP
40.0000 mg | Freq: Once | INTRAMUSCULAR | Status: AC
  Administered 2016-02-17: 40 mg
  Filled 2016-02-17: qty 1

## 2016-02-17 MED ORDER — LACTATED RINGERS IV SOLN
1000.0000 mL | Freq: Once | INTRAVENOUS | Status: AC
  Administered 2016-02-17: 1000 mL via INTRAVENOUS

## 2016-02-17 MED ORDER — FENTANYL CITRATE (PF) 100 MCG/2ML IJ SOLN
25.0000 ug | INTRAMUSCULAR | Status: DC | PRN
  Administered 2016-02-17: 50 ug via INTRAVENOUS
  Filled 2016-02-17: qty 2

## 2016-02-17 MED ORDER — LIDOCAINE HCL (PF) 1 % IJ SOLN
10.0000 mL | Freq: Once | INTRAMUSCULAR | Status: AC
  Administered 2016-02-17: 10 mL
  Filled 2016-02-17: qty 10

## 2016-02-17 NOTE — Progress Notes (Signed)
Patient's Name: Alan Schmidt  MRN: [id number]  Referring Provider: Milinda Pointer, MD  DOB: Feb 22, 1945  PCP: [name], MD  DOS: 02/17/2016  Note by: Kathlen Brunswick. Dossie Arbour, MD  Service setting: Ambulatory outpatient  Location: [facility] (AMB) Pain Management Facility  Visit type: Procedure  Specialty: Interventional Pain Management  Patient type: Established   Primary Reason for Visit: Interventional Pain Management Treatment. CC: Back Pain (lower)  Procedure:  Anesthesia, Analgesia, Anxiolysis:  Type: Palliative Medial Branch Facet Radiofrequency Ablation Region: Lumbar Level: L2, L3, L4, L5, & S1 Medial Branch Level(s) Laterality: Right-Sided  Type: Local Anesthesia with Moderate (Conscious) Sedation Local Anesthetic: Lidocaine 1% Route: Intravenous (IV) IV Access: Secured Sedation: Meaningful verbal contact was maintained at all times during the procedure  Indication(s): Analgesia and Anxiety  Indications: 1. Lumbar facet syndrome (Bilateral) (R>L)   2. Lumbar spondylosis, unspecified spinal osteoarthritis   3. Lumbar spondylosis   4. Chronic low back pain (Location of Primary Source of Pain) (Bilateral) (R>L)    [name] has either failed to respond, was unable to tolerate, or simply did not get enough benefit from other more conservative therapies including, but not limited to: 1. Over-the-counter medications 2. Anti-inflammatory medications 3. Muscle relaxants 4. Membrane stabilizers 5. Opioids 6. Physical therapy 7. Modalities (Heat, ice, etc.) 8. Invasive techniques such as nerve blocks. [name] has attained more than 50% relief of the pain from a series of diagnostic injections conducted in separate occasions.  Pain Score: Pre-procedure: 3 /10 Post-procedure: 3 /10  Pre-op Assessment:  Previous date of service: 02/13/16 Service provided: Med Refill (pts has patch behind right ear) Alan Schmidt is a 71 y.o. (year old), male patient, seen today for  interventional treatment. He  has a past surgical history that includes Total hip arthroplasty; Tonsillectomy; and right hip surgery. His primarily concern today is the Back Pain (lower)  Blood pressure (!) 149/70, pulse 74, temperature 97.7 F (36.5 C), temperature source Temporal, resp. rate 16, height 6\' 1"  (1.854 m), weight 250 lb (113.4 kg), SpO2 97 %.Calculated BMI: Body mass index is 32.98 kg/m. Risk Assessment: Allergies: Reviewed. He is allergic to contrast media [iodinated diagnostic agents]; iodine; and shellfish allergy. Allergy Precautions: None required Coagulopathies: "Reviewed. None identified. Blood-thinner therapy: None at this time Active Infection(s): Reviewed. None identified. Alan Schmidt is afebrile Site Confirmation: Alan Schmidt was asked to confirm the procedure and laterality before marking the site Procedure checklist: Completed Consent: Before the procedure and under the influence of no sedative(s), amnesic(s), or anxiolytics, the patient was informed of the treatment options, risks and possible complications. To fulfill our ethical and legal obligations, as recommended by the American Medical Association's Code of Ethics, I have informed the patient of my clinical impression; the nature and purpose of the treatment or procedure; the risks, benefits, and possible complications of the intervention; the alternatives, including doing nothing; the risk(s) and benefit(s) of the alternative treatment(s) or procedure(s); and the risk(s) and benefit(s) of doing nothing. The patient was provided information about the general risks and possible complications associated with the procedure. These may include, but are not limited to: failure to achieve desired goals, infection, bleeding, organ or nerve damage, allergic reactions, paralysis, and death. In addition, the patient was informed of those risks and complications associated to Spine-related procedures, such as failure to decrease  pain; infection (i.e.: Meningitis, epidural or intraspinal abscess); bleeding (i.e.: epidural hematoma, subarachnoid hemorrhage, or any other type of intraspinal or peri-dural bleeding); organ or nerve damage (  i.e.: Any type of peripheral nerve, nerve root, or spinal cord injury) with subsequent damage to sensory, motor, and/or autonomic systems, resulting in permanent pain, numbness, and/or weakness of one or several areas of the body; allergic reactions; (i.e.: anaphylactic reaction); and/or death. Furthermore, the patient was informed of those risks and complications associated with the medications. These include, but are not limited to: allergic reactions (i.e.: anaphylactic or anaphylactoid reaction(s)); adrenal axis suppression; blood sugar elevation that in diabetics may result in ketoacidosis or comma; water retention that in patients with history of congestive heart failure may result in shortness of breath, pulmonary edema, and decompensation with resultant heart failure; weight gain; swelling or edema; medication-induced neural toxicity; particulate matter embolism and blood vessel occlusion with resultant organ, and/or nervous system infarction; and/or aseptic necrosis of one or more joints. Finally, the patient was informed that Medicine is not an exact science; therefore, there is also the possibility of unforeseen or unpredictable risks and/or possible complications that may result in a catastrophic outcome. The patient indicated having understood very clearly. We have given the patient no guarantees and we have made no promises. Enough time was given to the patient to ask questions, all of which were answered to the patient's satisfaction. [name] has indicated that he wanted to continue with the procedure. Attestation: I, the ordering provider, attest that I have discussed with the patient the benefits, risks, side-effects, alternatives, likelihood of achieving goals, and potential problems  during recovery for the procedure that I have provided informed consent. Date: 02/17/2016; Time: 12:49 PM  Pre-Procedure Preparation:  Monitoring: As per clinic protocol. Respiration, ETCO2, SpO2, BP, heart rate and rhythm monitor placed and checked for adequate function Safety Precautions: Patient was assessed for positional comfort and pressure points before starting the procedure. Time-out: I initiated and conducted the "Time-out" before starting the procedure, as per protocol. The patient was asked to participate by confirming the accuracy of the "Time Out" information. Verification of the correct person, site, and procedure were performed and confirmed by me, the nursing staff, and the patient. "Time-out" conducted as per Joint Commission's Universal Protocol (UP.01.01.01). Date: 02/17/2016; Time: 1405 hrs.  Description of Procedure Process:   Position: Prone Target Area: For Lumbar Facet blocks, the target is the groove formed by the junction of the transverse process and superior articular process. For the L5 dorsal ramus, the target is the notch between superior articular process and sacral ala. For the S1 dorsal ramus, the target is the superior and lateral edge of the posterior S1 Sacral foramen. Approach: Paraspinal approach. Area Prepped: Entire Posterior Lumbosacral Region Prepping solution: Hibiclens (4.0% Chlorhexidine gluconate solution) Safety Precautions: Aspiration looking for blood return was conducted prior to all injections. At no point did we inject any substances, as a needle was being advanced. No attempts were made at seeking any paresthesias. Safe injection practices and needle disposal techniques used. Medications properly checked for expiration dates. SDV (single dose vial) medications used. Description of the Procedure: Protocol guidelines were followed. The patient was placed in position over the fluoroscopy table. The target area was identified and the area prepped in  the usual manner. Skin desensitized using vapocoolant spray. Skin & deeper tissues infiltrated with local anesthetic. Appropriate amount of time allowed to pass for local anesthetics to take effect. Radiofrequency needles were introduced to the area of the medial branch at the junction of the superior articular process and transverse process using fluoroscopy. Using the [employer], sensory stimulation using 50 Hz  was used to locate & identify the nerve, making sure that the needle was positioned such that there was no sensory stimulation below 0.3 V or above 0.7 V. Stimulation using 2 Hz was used to evaluate the motor component. Care was taken not to lesion any nerves that demonstrated motor stimulation of the lower extremities at an output of less than 2.5 times that of the sensory threshold, or a maximum of 2.0 V. Once satisfactory placement of the needles was achieved, the above solution was slowly injected after negative aspiration. After waiting for at least 2 minutes, the ablation was performed at 80 degrees C for 60 seconds.The needles were then removed and the area cleansed, making sure to leave some of the prepping solution back to take advantage of its long term bactericidal properties. Materials & Medications:  Needle(s) Type: Teflon-coated, curved tip, Radiofrequency needle(s) Gauge: 22G Length: 10cm Medication(s): We administered fentaNYL, lactated ringers, midazolam, lidocaine (PF), triamcinolone acetonide, ropivacaine (PF) 2 mg/mL (0.2%), glycopyrrolate, and lidocaine (PF). Please see chart orders for dosing details.  Imaging Guidance (Spinal):  Type of Imaging Technique: Fluoroscopy Guidance (Spinal) Indication(s): Assistance in needle guidance and placement for procedures requiring needle placement in or near specific anatomical locations not easily accessible without such assistance. Exposure Time: Please see nurses notes. Contrast: None used. Fluoroscopic  Guidance: I was personally present during the use of fluoroscopy. "Tunnel Vision Technique" used to obtain the best possible view of the target area. Parallax error corrected before commencing the procedure. "Direction-depth-direction" technique used to introduce the needle under continuous pulsed fluoroscopy. Once target was reached, antero-posterior, oblique, and lateral fluoroscopic projection used confirm needle placement in all planes. Images permanently stored in EMR. Interpretation: No contrast injected. I personally interpreted the imaging intraoperatively. Adequate needle placement confirmed in multiple planes. Permanent images saved into the patient's record.  Antibiotic Prophylaxis:  Indication(s): None identified Antibiotic given: None Post-operative Assessment:  EBL: None Complications: No immediate post-treatment complications observed by team, or reported by patient. Note: The patient tolerated the entire procedure well. A repeat set of vitals were taken after the procedure and the patient was kept under observation following institutional policy, for this type of procedure. Post-procedural neurological assessment was performed, showing return to baseline, prior to discharge. The patient was provided with post-procedure discharge instructions, including a section on how to identify potential problems. Should any problems arise concerning this procedure, the patient was given instructions to immediately contact us, at any time, without hesitation. In any case, we plan to contact the patient by telephone for a follow-up status report regarding this interventional procedure. Comments:  No additional relevant information.  Plan of Care  Disposition: Discharge home Discharge Date: 02/17/2016; Discharge Time: 1518 hrs Physician-requested Follow-up:  Return in about 6 weeks (around 03/30/2016) for Post-Procedure evaluation.  Future Appointments Date Time Provider [place]  03/30/2016  1:00 PM Milinda Pointer, MD [facility] None  04/16/2016 10:45 AM [name], MD [facility] None  05/14/2016 10:30 AM Milinda Pointer, MD [facility] None   Medications ordered for procedure: Meds ordered this encounter  Medications  . fentaNYL (SUBLIMAZE) injection 25-50 mcg    Make sure Narcan is available in the pyxis when using this medication. In the event of respiratory depression (RR< 8/min): Titrate NARCAN (naloxone) in increments of 0.1 to 0.2 mg IV at 2-3 minute intervals, until desired degree of reversal.  . lactated ringers infusion 1,000 mL  . midazolam (VERSED) 5 MG/5ML injection 1-2 mg    Make sure Flumazenil is  available in the pyxis when using this medication. If oversedation occurs, administer 0.2 mg IV over 15 sec. If after 45 sec no response, administer 0.2 mg again over 1 min; may repeat at 1 min intervals; not to exceed 4 doses (1 mg)  . lidocaine (PF) (XYLOCAINE) 1 % injection 10 mL  . triamcinolone acetonide (KENALOG-40) injection 40 mg  . ropivacaine (PF) 2 mg/mL (0.2%) (NAROPIN) injection 9 mL  . glycopyrrolate (ROBINUL) injection 0.2 mg  . lidocaine (PF) (XYLOCAINE) 1 % injection    [place]ne [place], Patti: cabinet override   Medications administered: We administered fentaNYL, lactated ringers, midazolam, lidocaine (PF), triamcinolone acetonide, ropivacaine (PF) 2 mg/mL (0.2%), glycopyrrolate, and lidocaine (PF).  See the medical record for exact dosing, route, and time of administration.  Lab-work, Procedure(s), & Referral(s) Ordered: Orders Placed This Encounter  Procedures  . DG C-Arm 1-60 Min-No Report  . Discharge instructions  . Follow-up  . Informed Consent Details: Transcribe to consent form and obtain patient signature  . Provider attestation of informed consent for procedure/surgical case  . Verify informed consent   Imaging Ordered: Results for orders placed in visit on 12/18/15  DG C-Arm 1-60 Min-No Report   Narrative Fluoroscopy was utilized by  the requesting physician.  No radiographic  interpretation.    New Prescriptions   No medications on file    Primary Care Physician: [name], MD Location: [facility] Note by: Kathlen Brunswick. Dossie Arbour, M.D, DABA, DABAPM, DABPM, DABIPP, FIPP Date: 02/17/2016; Time: 4:58 PM  Disclaimer:  Medicine is not an exact science. The only guarantee in medicine is that nothing is guaranteed. It is important to note that the decision to proceed with this intervention was based on the information collected from the patient. The Data and conclusions were drawn from the patient's questionnaire, the interview, and the physical examination. Because the information was provided in large part by the patient, it cannot be guaranteed that it has not been purposely or unconsciously manipulated. Every effort has been made to obtain as much relevant data as possible for this evaluation. It is important to note that the conclusions that lead to this procedure are derived in large part from the available data. Always take into account that the treatment will also be dependent on availability of resources and existing treatment guidelines, considered by other Pain Management Practitioners as being common knowledge and practice, at the time of the intervention. For Medico-Legal purposes, it is also important to point out that variation in procedural techniques and pharmacological choices are the acceptable norm. The indications, contraindications, technique, and results of the above procedure should only be interpreted and judged by a Board-Certified Interventional Pain Specialist with extensive familiarity and expertise in the same exact procedure and technique. Attempts at providing opinions without similar or greater experience and expertise than that of the treating physician will be considered as inappropriate and unethical, and shall result in a formal complaint to the state medical board and  applicable specialty societies.  Instructions provided at this appointment: Patient Instructions  Pain Management Discharge Instructions  General Discharge Instructions :  If you need to reach your doctor call: Monday-Friday 8:00 am - 4:00 pm at [phone] or toll free [phone].  After clinic hours [phone] to have operator reach doctor.  Bring all of your medication bottles to all your appointments in the pain clinic.  To cancel or reschedule your appointment with Pain Management please remember to call 24 hours in advance to avoid a  fee.  Refer to the educational materials which you have been given on: General Risks, I had my Procedure. Discharge Instructions, Post Sedation.  Post Procedure Instructions:  The drugs you were given will stay in your system until tomorrow, so for the next 24 hours you should not drive, make any legal decisions or drink any alcoholic beverages.  You may eat anything you prefer, but it is better to start with liquids then soups and crackers, and gradually work up to solid foods.  Please notify your doctor immediately if you have any unusual bleeding, trouble breathing or pain that is not related to your normal pain.  Depending on the type of procedure that was done, some parts of your body may feel week and/or numb.  This usually clears up by tonight or the next day.  Walk with the use of an assistive device or accompanied by an adult for the 24 hours.  You may use ice on the affected area for the first 24 hours.  Put ice in a Ziploc bag and cover with a towel and place against area 15 minutes on 15 minutes off.  You may switch to heat after 24 hours.Radiofrequency Lesioning Introduction Radiofrequency lesioning is a procedure that is performed to relieve pain. The procedure is often used for back, neck, or arm pain. Radiofrequency lesioning involves the use of a machine that creates radio waves to make heat. During the procedure, the heat is  applied to the nerve that carries the pain signal. The heat damages the nerve and interferes with the pain signal. Pain relief usually starts about 2 weeks after the procedure and lasts for 6 months to 1 year. Tell a health care provider about:  Any allergies you have.  All medicines you are taking, including vitamins, herbs, eye drops, creams, and over-the-counter medicines.  Any problems you or family members have had with anesthetic medicines.  Any blood disorders you have.  Any surgeries you have had.  Any medical conditions you have.  Whether you are pregnant or may be pregnant. What are the risks? Generally, this is a safe procedure. However, problems may occur, including:  Pain or soreness at the injection site.  Infection at the injection site.  Damage to nerves or blood vessels. What happens before the procedure?  Ask your health care provider about:  Changing or stopping your regular medicines. This is especially important if you are taking diabetes medicines or blood thinners.  Taking medicines such as aspirin and ibuprofen. These medicines can thin your blood. Do not take these medicines before your procedure if your health care provider instructs you not to.  Follow instructions from your health care provider about eating or drinking restrictions.  Plan to have someone take you home after the procedure.  If you go home right after the procedure, plan to have someone with you for 24 hours. What happens during the procedure?  You will be given one or more of the following:  A medicine to help you relax (sedative).  A medicine to numb the area (local anesthetic).  You will be awake during the procedure. You will need to be able to talk with the health care provider during the procedure.  With the help of a type of X-ray (fluoroscopy), the health care provider will insert a radiofrequency needle into the area to be treated.  Next, a wire that carries the  radio waves (electrode) will be put through the radiofrequency needle. An electrical pulse will be sent through  the electrode to verify the correct nerve. You will feel a tingling sensation, and you may have muscle twitching.  Then, the tissue that is around the needle tip will be heated by an electric current that is passed using the radiofrequency machine. This will numb the nerves.  A bandage (dressing) will be put on the insertion area after the procedure is done. The procedure may vary among health care providers and hospitals. What happens after the procedure?  Your blood pressure, heart rate, breathing rate, and blood oxygen level will be monitored often until the medicines you were given have worn off.  Return to your normal activities as directed by your health care provider. This information is not intended to replace advice given to you by your health care provider. Make sure you discuss any questions you have with your health care provider. Document Released: 09/17/2010 Document Revised: 06/27/2015 Document Reviewed: 02/26/2014  2017 [employer]

## 2016-02-17 NOTE — Patient Instructions (Signed)
Pain Management Discharge Instructions  General Discharge Instructions :  If you need to reach your doctor call: Monday-Friday 8:00 am - 4:00 pm at [phone] or toll free [phone].  After clinic hours [phone] to have operator reach doctor.  Bring all of your medication bottles to all your appointments in the pain clinic.  To cancel or reschedule your appointment with Pain Management please remember to call 24 hours in advance to avoid a fee.  Refer to the educational materials which you have been given on: General Risks, I had my Procedure. Discharge Instructions, Post Sedation.  Post Procedure Instructions:  The drugs you were given will stay in your system until tomorrow, so for the next 24 hours you should not drive, make any legal decisions or drink any alcoholic beverages.  You may eat anything you prefer, but it is better to start with liquids then soups and crackers, and gradually work up to solid foods.  Please notify your doctor immediately if you have any unusual bleeding, trouble breathing or pain that is not related to your normal pain.  Depending on the type of procedure that was done, some parts of your body may feel week and/or numb.  This usually clears up by tonight or the next day.  Walk with the use of an assistive device or accompanied by an adult for the 24 hours.  You may use ice on the affected area for the first 24 hours.  Put ice in a Ziploc bag and cover with a towel and place against area 15 minutes on 15 minutes off.  You may switch to heat after 24 hours.Radiofrequency Lesioning Introduction Radiofrequency lesioning is a procedure that is performed to relieve pain. The procedure is often used for back, neck, or arm pain. Radiofrequency lesioning involves the use of a machine that creates radio waves to make heat. During the procedure, the heat is applied to the nerve that carries the pain signal. The heat damages the nerve and interferes with the  pain signal. Pain relief usually starts about 2 weeks after the procedure and lasts for 6 months to 1 year. Tell a health care provider about:  Any allergies you have.  All medicines you are taking, including vitamins, herbs, eye drops, creams, and over-the-counter medicines.  Any problems you or family members have had with anesthetic medicines.  Any blood disorders you have.  Any surgeries you have had.  Any medical conditions you have.  Whether you are pregnant or may be pregnant. What are the risks? Generally, this is a safe procedure. However, problems may occur, including:  Pain or soreness at the injection site.  Infection at the injection site.  Damage to nerves or blood vessels. What happens before the procedure?  Ask your health care provider about:  Changing or stopping your regular medicines. This is especially important if you are taking diabetes medicines or blood thinners.  Taking medicines such as aspirin and ibuprofen. These medicines can thin your blood. Do not take these medicines before your procedure if your health care provider instructs you not to.  Follow instructions from your health care provider about eating or drinking restrictions.  Plan to have someone take you home after the procedure.  If you go home right after the procedure, plan to have someone with you for 24 hours. What happens during the procedure?  You will be given one or more of the following:  A medicine to help you relax (sedative).  A medicine to numb the area (  local anesthetic).  You will be awake during the procedure. You will need to be able to talk with the health care provider during the procedure.  With the help of a type of X-ray (fluoroscopy), the health care provider will insert a radiofrequency needle into the area to be treated.  Next, a wire that carries the radio waves (electrode) will be put through the radiofrequency needle. An electrical pulse will be sent  through the electrode to verify the correct nerve. You will feel a tingling sensation, and you may have muscle twitching.  Then, the tissue that is around the needle tip will be heated by an electric current that is passed using the radiofrequency machine. This will numb the nerves.  A bandage (dressing) will be put on the insertion area after the procedure is done. The procedure may vary among health care providers and hospitals. What happens after the procedure?  Your blood pressure, heart rate, breathing rate, and blood oxygen level will be monitored often until the medicines you were given have worn off.  Return to your normal activities as directed by your health care provider. This information is not intended to replace advice given to you by your health care provider. Make sure you discuss any questions you have with your health care provider. Document Released: 09/17/2010 Document Revised: 06/27/2015 Document Reviewed: 02/26/2014  2017 [employer]  

## 2016-02-18 ENCOUNTER — Telehealth: Payer: Self-pay

## 2016-02-18 NOTE — Telephone Encounter (Signed)
Denies any needs at this time. States he is doing OK. Instructed to call if needed. 

## 2016-03-04 ENCOUNTER — Other Ambulatory Visit: Payer: Self-pay | Admitting: Family Medicine

## 2016-03-12 ENCOUNTER — Other Ambulatory Visit: Payer: Self-pay | Admitting: Family Medicine

## 2016-03-12 DIAGNOSIS — B351 Tinea unguium: Secondary | ICD-10-CM | POA: Diagnosis not present

## 2016-03-12 DIAGNOSIS — L851 Acquired keratosis [keratoderma] palmaris et plantaris: Secondary | ICD-10-CM | POA: Diagnosis not present

## 2016-03-12 DIAGNOSIS — E1140 Type 2 diabetes mellitus with diabetic neuropathy, unspecified: Secondary | ICD-10-CM | POA: Diagnosis not present

## 2016-03-13 NOTE — Telephone Encounter (Signed)
Please determine what the patient's dose is for the NovoLog. He will need an appointment for follow-up on his diabetes as well. Thanks.

## 2016-03-13 NOTE — Telephone Encounter (Signed)
Left message to return call 

## 2016-03-13 NOTE — Telephone Encounter (Signed)
Last OV 11/21/15 last filled  Alprazolam 11/21/15 60 2rf Novolog filed under historical provider

## 2016-03-16 NOTE — Telephone Encounter (Signed)
I have scheduled patient for next Monday at 11:15, patient seemed very unclear of when and how much to take the insulin.

## 2016-03-16 NOTE — Telephone Encounter (Signed)
Patient states he is on a sliding scale depending on his blood sugar, if he does not need it he does not take it. Patient takes 12-18 units twice a day.

## 2016-03-16 NOTE — Telephone Encounter (Signed)
Faxed xanax 

## 2016-03-16 NOTE — Telephone Encounter (Signed)
Sent to pharmacy. Please contact him and make sure he is not taking novolog and novolog 70/30. Please fax xanax.

## 2016-03-16 NOTE — Telephone Encounter (Signed)
Left message to return call 

## 2016-03-17 NOTE — Telephone Encounter (Signed)
Patient is using novolog flex pen

## 2016-03-18 ENCOUNTER — Other Ambulatory Visit: Payer: Self-pay | Admitting: Family Medicine

## 2016-03-18 MED ORDER — INSULIN LISPRO 100 UNIT/ML (KWIKPEN): 12.0000 [IU] | PEN_INJECTOR | Freq: Two times a day (BID) | SUBCUTANEOUS | 3 refills | Status: DC

## 2016-03-18 NOTE — Progress Notes (Unsigned)
Please let the patient know that his insurance rejected the NovoLog. We'll switch him to Humalog which functions in a similar manner. This has been sent to his pharmacy. Thanks.

## 2016-03-19 ENCOUNTER — Telehealth: Payer: Self-pay | Admitting: Family Medicine

## 2016-03-19 NOTE — Progress Notes (Signed)
Left message to return call 

## 2016-03-19 NOTE — Progress Notes (Signed)
Patient notified

## 2016-03-19 NOTE — Telephone Encounter (Signed)
See other message

## 2016-03-19 NOTE — Telephone Encounter (Signed)
Pt called back returning your call. Thank you!  Call pt @ [phone]

## 2016-03-23 ENCOUNTER — Encounter: Payer: Self-pay | Admitting: Family Medicine

## 2016-03-23 ENCOUNTER — Ambulatory Visit (INDEPENDENT_AMBULATORY_CARE_PROVIDER_SITE_OTHER): Payer: Medicare Other | Admitting: Family Medicine

## 2016-03-23 VITALS — BP 120/72 | HR 66 | Temp 98.1°F | Wt 251.4 lb

## 2016-03-23 DIAGNOSIS — E1142 Type 2 diabetes mellitus with diabetic polyneuropathy: Secondary | ICD-10-CM

## 2016-03-23 DIAGNOSIS — Z794 Long term (current) use of insulin: Secondary | ICD-10-CM | POA: Diagnosis not present

## 2016-03-23 DIAGNOSIS — H6123 Impacted cerumen, bilateral: Secondary | ICD-10-CM | POA: Diagnosis not present

## 2016-03-23 DIAGNOSIS — H6120 Impacted cerumen, unspecified ear: Secondary | ICD-10-CM | POA: Insufficient documentation

## 2016-03-23 LAB — COMPREHENSIVE METABOLIC PANEL
ALT: 37 U/L (ref 0–53)
AST: 25 U/L (ref 0–37)
Albumin: 4 g/dL (ref 3.5–5.2)
Alkaline Phosphatase: 67 U/L (ref 39–117)
BILIRUBIN TOTAL: 0.3 mg/dL (ref 0.2–1.2)
BUN: 14 mg/dL (ref 6–23)
CALCIUM: 9.4 mg/dL (ref 8.4–10.5)
CHLORIDE: 101 meq/L (ref 96–112)
CO2: 32 meq/L (ref 19–32)
Creatinine, Ser: 0.95 mg/dL (ref 0.40–1.50)
GFR: 100.77 mL/min (ref >60.00)
GLUCOSE: 165 mg/dL — AB (ref 70–99)
POTASSIUM: 4.3 meq/L (ref 3.5–5.1)
Sodium: 137 mEq/L (ref 135–145)
Total Protein: 7.4 g/dL (ref 6.0–8.3)

## 2016-03-23 LAB — HEMOGLOBIN A1C: HEMOGLOBIN A1C: 9.7 % — AB (ref 4.6–6.5)

## 2016-03-23 MED ORDER — MOMETASONE FUROATE 50 MCG/ACT NA SUSP: 2.0000 | Freq: Every day | NASAL | 1 refills | Status: DC

## 2016-03-23 NOTE — Assessment & Plan Note (Signed)
Improved with irrigation. Normal TMs noted. Advised on debrox otc.

## 2016-03-23 NOTE — Progress Notes (Signed)
  Alan Rumps, MD Phone: [phone]  Alan Schmidt is a 71 y.o. male who presents today for f/u.  DIABETES Disease Monitoring: Blood Sugar ranges-mostly less than 200 recently, did have a period of time where they were up in the 400s last year Polyuria/phagia/dipsia- no     Medications: Compliance- taking Levemir 17 units twice daily, NovoLog sliding scale ranging from 5-12 units Hypoglycemic symptoms- rarely occur if he takes higher doses of NovoLog He has been taking apple cider vinegar to help with this as well.  Patient notes his ears have wax in them and they feel impacted. He would like them cleaned.  ROS see history of present illness  Objective  Physical Exam Vitals:   03/23/16 1110  BP: 120/72  Pulse: 66  Temp: 98.1 F (36.7 C)    BP Readings from Last 3 Encounters:  03/23/16 120/72  02/17/16 (!) 149/70  02/13/16 114/67   Wt Readings from Last 3 Encounters:  03/23/16 251 lb 6.4 oz (114 kg)  02/17/16 250 lb (113.4 kg)  02/13/16 250 lb (113.4 kg)    Physical Exam  Constitutional: No distress.  HENT:  Bilateral ear canals with cerumen, status post irrigation by CMA revealing normal TMs  Cardiovascular: Normal rate, regular rhythm and normal heart sounds.   Pulmonary/Chest: Effort normal and breath sounds normal.  Musculoskeletal: He exhibits no edema.  Neurological: He is alert. Gait normal.  Skin: Skin is warm and dry. He is not diaphoretic.     Assessment/Plan: Please see individual problem list.  Diabetes ([place]) Somewhat better control over the last week or so. Discussed continuing Levemir. He is advised of sliding scale insulin regimen with 5 units of rapid acting insulin for blood sugars greater than 200 and then 7 units if they become greater than 300. I did discuss having him see our pharmacist for additional management of his diabetes in the next several weeks to try to get this under better control.  Cerumen impaction Improved with  irrigation. Normal TMs noted. Advised on debrox otc.   Orders Placed This Encounter  Procedures  . HgB A1c  . Comp Met (CMET)    Alan Rumps, MD [place]

## 2016-03-23 NOTE — Progress Notes (Signed)
Pre visit review using our clinic review tool, if applicable. No additional management support is needed unless otherwise documented below in the visit note. 

## 2016-03-23 NOTE — Assessment & Plan Note (Signed)
Somewhat better control over the last week or so. Discussed continuing Levemir. He is advised of sliding scale insulin regimen with 5 units of rapid acting insulin for blood sugars greater than 200 and then 7 units if they become greater than 300. I did discuss having him see our pharmacist for additional management of his diabetes in the next several weeks to try to get this under better control.

## 2016-03-23 NOTE — Patient Instructions (Signed)
Nice to see you. We will have you continue your Levemir 17 units twice daily. You will continue your Victoza and metformin. We will change your sliding scale insulin to 5 units of NovoLog for sugars greater than 200 and 7 units of NovoLog for sugars greater than 300. I would like you to see our pharmacist in follow-up in the next several weeks as well.

## 2016-03-30 ENCOUNTER — Ambulatory Visit: Payer: No Typology Code available for payment source | Attending: Pain Medicine | Admitting: Pain Medicine

## 2016-03-30 ENCOUNTER — Encounter: Payer: Self-pay | Admitting: Pain Medicine

## 2016-03-30 VITALS — BP 135/72 | HR 64 | Temp 97.7°F | Resp 18 | Ht 73.0 in | Wt 250.0 lb

## 2016-03-30 DIAGNOSIS — Z6832 Body mass index (BMI) 32.0-32.9, adult: Secondary | ICD-10-CM | POA: Insufficient documentation

## 2016-03-30 DIAGNOSIS — M79604 Pain in right leg: Secondary | ICD-10-CM

## 2016-03-30 DIAGNOSIS — Z87442 Personal history of urinary calculi: Secondary | ICD-10-CM | POA: Diagnosis not present

## 2016-03-30 DIAGNOSIS — M25511 Pain in right shoulder: Secondary | ICD-10-CM | POA: Diagnosis not present

## 2016-03-30 DIAGNOSIS — M5442 Lumbago with sciatica, left side: Secondary | ICD-10-CM | POA: Diagnosis not present

## 2016-03-30 DIAGNOSIS — Z7982 Long term (current) use of aspirin: Secondary | ICD-10-CM | POA: Insufficient documentation

## 2016-03-30 DIAGNOSIS — G894 Chronic pain syndrome: Secondary | ICD-10-CM | POA: Diagnosis not present

## 2016-03-30 DIAGNOSIS — Z794 Long term (current) use of insulin: Secondary | ICD-10-CM | POA: Insufficient documentation

## 2016-03-30 DIAGNOSIS — G8929 Other chronic pain: Secondary | ICD-10-CM | POA: Diagnosis not present

## 2016-03-30 DIAGNOSIS — M5412 Radiculopathy, cervical region: Secondary | ICD-10-CM | POA: Diagnosis not present

## 2016-03-30 DIAGNOSIS — Z9889 Other specified postprocedural states: Secondary | ICD-10-CM | POA: Insufficient documentation

## 2016-03-30 DIAGNOSIS — H9201 Otalgia, right ear: Secondary | ICD-10-CM | POA: Insufficient documentation

## 2016-03-30 DIAGNOSIS — I129 Hypertensive chronic kidney disease with stage 1 through stage 4 chronic kidney disease, or unspecified chronic kidney disease: Secondary | ICD-10-CM | POA: Insufficient documentation

## 2016-03-30 DIAGNOSIS — E785 Hyperlipidemia, unspecified: Secondary | ICD-10-CM | POA: Insufficient documentation

## 2016-03-30 DIAGNOSIS — Z5189 Encounter for other specified aftercare: Secondary | ICD-10-CM | POA: Diagnosis not present

## 2016-03-30 DIAGNOSIS — M25512 Pain in left shoulder: Secondary | ICD-10-CM | POA: Insufficient documentation

## 2016-03-30 DIAGNOSIS — Z87891 Personal history of nicotine dependence: Secondary | ICD-10-CM | POA: Insufficient documentation

## 2016-03-30 DIAGNOSIS — R51 Headache: Secondary | ICD-10-CM | POA: Insufficient documentation

## 2016-03-30 DIAGNOSIS — E1142 Type 2 diabetes mellitus with diabetic polyneuropathy: Secondary | ICD-10-CM | POA: Diagnosis not present

## 2016-03-30 DIAGNOSIS — F419 Anxiety disorder, unspecified: Secondary | ICD-10-CM | POA: Diagnosis not present

## 2016-03-30 DIAGNOSIS — M79605 Pain in left leg: Secondary | ICD-10-CM

## 2016-03-30 DIAGNOSIS — M5441 Lumbago with sciatica, right side: Secondary | ICD-10-CM

## 2016-03-30 DIAGNOSIS — Z79899 Other long term (current) drug therapy: Secondary | ICD-10-CM | POA: Diagnosis not present

## 2016-03-30 DIAGNOSIS — F329 Major depressive disorder, single episode, unspecified: Secondary | ICD-10-CM | POA: Insufficient documentation

## 2016-03-30 NOTE — Progress Notes (Signed)
Patient's Name: Alan Schmidt  MRN: [id number]  Referring Provider: [name], MD  DOB: 1945-05-24  PCP: [name], MD  DOS: 03/30/2016  Note by: Kathlen Brunswick. Dossie Arbour, MD  Service setting: Ambulatory outpatient  Specialty: Interventional Pain Management  Location: [facility] (AMB) Pain Management Facility    Patient type: Established   Primary Reason(s) for Visit: Encounter for post-procedure evaluation of chronic illness with mild to moderate exacerbation CC: Back Pain (lower)  HPI  Alan Schmidt is a 71 y.o. year old, male patient, who comes today for a post-procedure evaluation. 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[place]); Long term current use of opiate analgesic; Long term prescription opiate use; Opiate use (60 MME/Day); Opiate dependence ([place]); Encounter for therapeutic drug level monitoring; Chronic neck pain (midline over the C7 spinous processes) (L>R); Neurogenic pain; Neuropathic pain; Contrast dye allergy; Chronic lower extremity pain (Location of Secondary source of pain) (Bilateral) (R>L); 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) (Right L5 dermatome); History of total hip replacement (Right); Chronic hip pain (Right); Class I Morbid obesity (HCC) (68% higher incidence of chronic low back pain); Essential hypertension; GERD (gastroesophageal reflux disease); Obstructive sleep apnea; Hyperlipidemia; Chronic kidney disease (CKD); Abnormal nerve conduction studies (severe bilateral lower extremity polyneuropathy); Chronic shoulder pain (Bilateral) (R>L); Occipital neuralgia (Left); Cervicogenic headache (Left); Diabetes ([place]); BPH (benign prostatic hyperplasia); Right ear pain; History of Vasovagal response to spinal injections; Allergic rhinitis; Anxiety and depression; Disturbance of skin sensation;  Prostate cancer screening; Chronic shoulder radicular pain (Left); Chronic cervical radicular pain (Left); Chronic pain syndrome; and Cerumen impaction on his problem list. His primarily concern today is the Back Pain (lower)  Pain Assessment: Self-Reported Pain Score: 2 /10             Reported level is compatible with observation.       Pain Type: Chronic pain Pain Location: Back Pain Orientation: Lower Pain Descriptors / Indicators: Aching Pain Frequency: Constant  Alan Schmidt comes in today for post-procedure evaluation after the treatment done on 02/17/2016. The patient did extremely well with the lumbar facet radiofrequency and at this point he refers that he doesn't need anything else done for the lower back.  Further details on both, my assessment(s), as well as the proposed treatment plan, please see below.  Post-Procedure Assessment  02/17/2016 Procedure: Right-sided lumbar facet radiofrequency ablation under fluoroscopic guidance and IV sedation  Post-procedure pain score: 3/10 No relief Influential Factors: BMI: 32.98 kg/m Intra-procedural challenges: None observed Assessment challenges: None detected         Post-procedural side-effects, adverse reactions, or complications: None reported Reported issues: None  Sedation: Sedation provided. When no sedatives are used, the analgesic levels obtained are directly associated to the effectiveness of the local anesthetics. However, when sedation is provided, the level of analgesia obtained during the initial 1 hour following the intervention, is believed to be the result of a combination of factors. These factors may include, but are not limited to: 1. The effectiveness of the local anesthetics used. 2. The effects of the analgesic(s) and/or anxiolytic(s) used. 3. The degree of discomfort experienced by the patient at the time of the procedure. 4. The patients ability and reliability in recalling and recording the events. 5. The  presence and influence of possible secondary gains and/or psychosocial factors. Reported result: Relief experienced during the 1st hour after the procedure:  60 % (Ultra-Short Term Relief) Interpretative annotation: Analgesia during this period is likely to be Local Anesthetic and/or IV Sedative (Analgesic/Anxiolitic) related.          Effects of local anesthetic: The analgesic effects attained during this period are directly associated to the localized infiltration of local anesthetics and therefore cary significant diagnostic value as to the etiological location, or anatomical origin, of the pain. Expected duration of relief is directly dependent on the pharmacodynamics of the local anesthetic used. Long-acting (4-6 hours) anesthetics used.  Reported result: Relief during the next 4 to 6 hour after the procedure: 60 % (Short-Term Relief) Interpretative annotation: Complete relief would suggest area to be the source of the pain.          Long-term benefit: Defined as the period of time past the expected duration of local anesthetics. With the possible exception of prolonged sympathetic blockade from the local anesthetics, benefits during this period are typically attributed to, or associated with, other factors such as analgesic sensory neuropraxia, antiinflammatory effects, or beneficial biochemical changes provided by agents other than the local anesthetics Reported result: Extended relief following procedure: 50 % (Long-Term Relief) Interpretative annotation: Good relief. This could suggest inflammation to be a significant component in the etiology to the pain.          Current benefits: Defined as persistent relief that continues at this point in time.   Reported results: Treated area: 50 % Alan Schmidt reports improvement in function Interpretative annotation: Ongoing benefits would suggest effective therapeutic approach  Interpretation: Results would suggest a successful diagnostic  intervention.          Laboratory Chemistry  Inflammation Markers Lab Results  Component Value Date   ESRSEDRATE 33 (H) 03/06/2015   CRP 0.5 03/06/2015   Renal Function Lab Results  Component Value Date   BUN 14 03/23/2016   CREATININE 0.95 03/23/2016   GFRAA >60 03/06/2015   GFRNONAA >60 03/06/2015   Hepatic Function Lab Results  Component Value Date   AST 25 03/23/2016   ALT 37 03/23/2016   ALBUMIN 4.0 03/23/2016   Electrolytes Lab Results  Component Value Date   NA 137 03/23/2016   K 4.3 03/23/2016   CL 101 03/23/2016   CALCIUM 9.4 03/23/2016   MG 1.8 03/06/2015   Pain Modulating Vitamins No results found for: [place], VD125OH2TOT, [id number], [id number], 25OHVITD1, 25OHVITD2, 25OHVITD3, VITAMINB12 Coagulation Parameters No results found for: INR, LABPROT, APTT, PLT Cardiovascular No results found for: BNP, HGB, HCT Note: Lab results reviewed.  Recent Diagnostic Imaging Review  Dg C-arm 1-60 Min-no Report  Result Date: 02/17/2016 There is no Radiologist interpretation  for this exam.  Note: Imaging results reviewed.          Meds  The patient has a current medication list which includes the following prescription(s): accu-chek compact plus, accu-chek softclix lancets, alprazolam, aspirin ec, bd pen needle nano u/f, carvedilol, vitamin d3, desoximetasone, fluoxetine, hydrochlorothiazide, hydroxyzine, insulin detemir, insulin lispro, magnesium oxide, metformin, mometasone, naloxone, oxycodone hcl, oxycodone hcl, oxycodone hcl, pravastatin, pregabalin, tamsulosin, valsartan, victoza, and vitamin d (ergocalciferol).  Current Outpatient Prescriptions on File Prior to Visit  Medication Sig  . ACCU-CHEK COMPACT PLUS test strip USE 1 STRIP 7 TIMES DAILY TO TEST  . ACCU-CHEK SOFTCLIX LANCETS lancets USE ONE MISC TWO TIMES DAILY  . ALPRAZolam (XANAX) 0.5 MG tablet TAKE 1 TABLET TWICE A DAY AS NEEDED  . aspirin EC 325 MG tablet Take 325 mg by mouth daily.   Marland Kitchen BD  PEN  NEEDLE NANO U/F 32G X 4 MM MISC USE EVERY DAY  . carvedilol (COREG) 25 MG tablet Take 1 tablet (25 mg total) by mouth 2 (two) times daily.  . Cholecalciferol (VITAMIN D3) 2000 units capsule Take 2,000 Units by mouth daily.  Marland Kitchen desoximetasone (TOPICORT) 0.25 % cream APPLY CREAM TO AFFECTED AREA TWO TIMES DAILY  . FLUoxetine (PROZAC) 40 MG capsule TAKE ONE CAPSULE BY MOUTH TWO TIMES DAILY  . hydrochlorothiazide (HYDRODIURIL) 25 MG tablet Take 1 tablet (25 mg total) by mouth daily.  . hydrOXYzine (ATARAX/VISTARIL) 50 MG tablet TAKE 1 TABLET BY MOUTH FOUR TIMES A DAY AS NEEDED  . insulin detemir (LEVEMIR) 100 UNIT/ML injection Inject 17 units into skin in the morning and 17 units into the skin before bedtime  . insulin lispro (HUMALOG KWIKPEN) 100 UNIT/ML KiwkPen Inject 0.12-0.18 mLs (12-18 Units total) into the skin 2 (two) times daily with a meal.  . magnesium oxide (MAG-OX) 400 (241.3 Mg) MG tablet Take 1 tablet (400 mg total) by mouth 2 (two) times daily.  . metFORMIN (GLUCOPHAGE) 1000 MG tablet Take 1 tablet (1,000 mg total) by mouth 2 (two) times daily with a meal.  . mometasone (NASONEX) 50 MCG/ACT nasal spray Place 2 sprays into the nose daily.  . naloxone (NARCAN) 2 MG/2ML injection Inject content of syringe into thigh muscle. Call 911.  Marland Kitchen Oxycodone HCl 10 MG TABS Take 1 tablet (10 mg total) by mouth every 6 (six) hours as needed.  . Oxycodone HCl 10 MG TABS Take 1 tablet (10 mg total) by mouth every 6 (six) hours as needed.  Derrill Memo ON 04/29/2016] Oxycodone HCl 10 MG TABS Take 1 tablet (10 mg total) by mouth every 6 (six) hours as needed.  . pravastatin (PRAVACHOL) 40 MG tablet Take 1 tablet (40 mg total) by mouth daily.  . pregabalin (LYRICA) 75 MG capsule Take 1 capsule (75 mg total) by mouth every 12 (twelve) hours.  . tamsulosin (FLOMAX) 0.4 MG CAPS capsule Take 1 capsule (0.4 mg total) by mouth 2 (two) times daily.  . valsartan (DIOVAN) 320 MG tablet Take 1 tablet (320 mg total) by mouth  daily.  Marland Kitchen VICTOZA 18 MG/3ML SOPN INJECT 0.3 MLS (1.8 MG TOTAL) INTO THE SKIN DAILY.  Marland Kitchen Vitamin D, Ergocalciferol, (DRISDOL) 50000 units CAPS capsule TAKE 1 CAPSULE BY MOUTH 2 TIMES A WEEK   No current facility-administered medications on file prior to visit.    ROS  Constitutional: Denies any fever or chills Gastrointestinal: No reported hemesis, hematochezia, vomiting, or acute GI distress Musculoskeletal: Denies any acute onset joint swelling, redness, loss of ROM, or weakness Neurological: No reported episodes of acute onset apraxia, aphasia, dysarthria, agnosia, amnesia, paralysis, loss of coordination, or loss of consciousness  Allergies  Alan Schmidt is allergic to contrast media [iodinated diagnostic agents]; iodine; and shellfish allergy.  [place]  Drug: Alan Schmidt  reports that he does not use drugs. Alcohol:  reports that he does not drink alcohol. Tobacco:  reports that he has quit smoking. He has never used smokeless tobacco. Medical:  has a past medical history of Anxiety; Chronic hip pain (Right) (12/05/2014); Chronic lumbar pain; Depression; Hyperlipidemia; Hypertension; Kidney stones; and Migraines. Family: family history includes Cancer in his mother; Heart disease in his father; Stroke in his father.  Past Surgical History:  Procedure Laterality Date  . right hip surgery     4 surgeries  . TONSILLECTOMY    . TOTAL HIP ARTHROPLASTY  Constitutional Exam  General appearance: Well nourished, well developed, and well hydrated. In no apparent acute distress Vitals:   03/30/16 1330  BP: 135/72  Pulse: 64  Resp: 18  Temp: 97.7 F (36.5 C)  TempSrc: Oral  SpO2: 98%  Weight: 250 lb (113.4 kg)  Height: 6\' 1"  (1.854 m)   BMI Assessment: Estimated body mass index is 32.98 kg/m as calculated from the following:   Height as of this encounter: 6\' 1"  (1.854 m).   Weight as of this encounter: 250 lb (113.4 kg).  BMI interpretation table: BMI level Category Range  association with higher incidence of chronic pain  <18 kg/m2 Underweight   18.5-24.9 kg/m2 Ideal body weight   25-29.9 kg/m2 Overweight Increased incidence by 20%  30-34.9 kg/m2 Obese (Class I) Increased incidence by 68%  35-39.9 kg/m2 Severe obesity (Class II) Increased incidence by 136%  >40 kg/m2 Extreme obesity (Class III) Increased incidence by 254%   BMI Readings from Last 4 Encounters:  03/30/16 32.98 kg/m  03/23/16 33.17 kg/m  02/17/16 32.98 kg/m  02/13/16 32.98 kg/m   Wt Readings from Last 4 Encounters:  03/30/16 250 lb (113.4 kg)  03/23/16 251 lb 6.4 oz (114 kg)  02/17/16 250 lb (113.4 kg)  02/13/16 250 lb (113.4 kg)  Psych/Mental status: Alert, oriented x 3 (person, place, & time)       Eyes: PERLA Respiratory: No evidence of acute respiratory distress  Cervical Spine Exam  Inspection: No masses, redness, or swelling Alignment: Symmetrical Functional ROM: Unrestricted ROM Stability: No instability detected Muscle strength & Tone: Functionally intact Sensory: Unimpaired Palpation: Non-contributory  Upper Extremity (UE) Exam    Side: Right upper extremity  Side: Left upper extremity  Inspection: No masses, redness, swelling, or asymmetry. No contractures  Inspection: No masses, redness, swelling, or asymmetry. No contractures  Functional ROM: Unrestricted ROM          Functional ROM: Unrestricted ROM          Muscle strength & Tone: Functionally intact  Muscle strength & Tone: Functionally intact  Sensory: Unimpaired  Sensory: Unimpaired  Palpation: Euthermic  Palpation: Euthermic  Specialized Test(s): Deferred         Specialized Test(s): Deferred          Thoracic Spine Exam  Inspection: No masses, redness, or swelling Alignment: Symmetrical Functional ROM: Unrestricted ROM Stability: No instability detected Sensory: Unimpaired Muscle strength & Tone: Functionally intact Palpation: Non-contributory  Lumbar Spine Exam  Inspection: No masses, redness,  or swelling Alignment: Symmetrical Functional ROM: Unrestricted ROM Stability: No instability detected Muscle strength & Tone: Functionally intact Sensory: Unimpaired Palpation: Non-contributory Provocative Tests: Lumbar Hyperextension and rotation test: evaluation deferred today       Patrick's Maneuver: evaluation deferred today              Gait & Posture Assessment  Ambulation: Unassisted Gait: Relatively normal for age and body habitus Posture: WNL   Lower Extremity Exam    Side: Right lower extremity  Side: Left lower extremity  Inspection: No masses, redness, swelling, or asymmetry. No contractures  Inspection: No masses, redness, swelling, or asymmetry. No contractures  Functional ROM: Unrestricted ROM          Functional ROM: Unrestricted ROM          Muscle strength & Tone: Functionally intact  Muscle strength & Tone: Functionally intact  Sensory: Unimpaired  Sensory: Unimpaired  Palpation: No palpable anomalies  Palpation: No palpable anomalies   Assessment  Primary  Diagnosis & Pertinent Problem List: The primary encounter diagnosis was Chronic low back pain (Location of Primary Source of Pain) (Bilateral) (R>L). A diagnosis of Chronic lower extremity pain (Location of Secondary source of pain) (Bilateral) (R>L) was also pertinent to this visit.  Status Diagnosis  Controlled Controlled Controlled 1. Chronic low back pain (Location of Primary Source of Pain) (Bilateral) (R>L)   2. Chronic lower extremity pain (Location of Secondary source of pain) (Bilateral) (R>L)      Plan of Care  Pharmacotherapy (Medications Ordered): No orders of the defined types were placed in this encounter.  New Prescriptions   No medications on file   Medications administered today: Alan Schmidt had no medications administered during this visit. Lab-work, procedure(s), and/or referral(s): No orders of the defined types were placed in this encounter.  Imaging and/or  referral(s): None  Interventional therapies: Planned, scheduled, and/or pending:   Not at this time.   Considering:   Palliative bilateral lumbar facet radiofrequency ablation under fluoroscopic guidance and IV sedation.    Palliative PRN treatment(s):   None at this time.    Provider-requested follow-up: Return for previously scheduled appointment (MD) Med-Mgmt.  Future Appointments Date Time Provider [place]  04/13/2016 11:00 AM Kem Parkinson, [facility] None  04/16/2016 10:45 AM [name], MD [facility] None  05/14/2016 10:30 AM Milinda Pointer, MD [facility] None   Primary Care Physician: [name], MD Location: [facility] Outpatient Pain Management Facility Note by: Kathlen Brunswick. Dossie Arbour, M.D, DABA, DABAPM, DABPM, DABIPP, FIPP Date: 03/30/2016; Time: 3:15 PM  Pain Score Disclaimer: We use the NRS-11 scale. This is a self-reported, subjective measurement of pain severity with only modest accuracy. It is used primarily to identify changes within a particular patient. It must be understood that outpatient pain scales are significantly less accurate that those used for research, where they can be applied under ideal controlled circumstances with minimal exposure to variables. In reality, the score is likely to be a combination of pain intensity and pain affect, where pain affect describes the degree of emotional arousal or changes in action readiness caused by the sensory experience of pain. Factors such as social and work situation, setting, emotional state, anxiety levels, expectation, and prior pain experience may influence pain perception and show large inter-individual differences that may also be affected by time variables.  Patient instructions provided during this appointment: Patient Instructions  Keep your previously scheduled appointment.

## 2016-03-30 NOTE — Patient Instructions (Signed)
Keep your previously scheduled appointment.

## 2016-03-31 NOTE — Telephone Encounter (Signed)
-----   Message from [name], MD sent at 03/24/2016  6:07 PM EST ----- Please let the patient know his A1c is worse at 9.7. I would like to add another oral medication called Jardiance to his regimen to see if this would be beneficial. Please see if he is willing to do this. Thanks.

## 2016-03-31 NOTE — Telephone Encounter (Signed)
Left message to return call 

## 2016-04-02 NOTE — Telephone Encounter (Signed)
Unable to reach patient, sent letter to call the office

## 2016-04-06 NOTE — Telephone Encounter (Signed)
Left message to return call 

## 2016-04-06 NOTE — Telephone Encounter (Signed)
Pt called back in regards to lab results. Please advise, thank you!  Call pt @ [phone]

## 2016-04-07 NOTE — Telephone Encounter (Signed)
Unable to reach patient, letter has already been sent

## 2016-04-13 ENCOUNTER — Ambulatory Visit: Payer: Medicare Other | Admitting: Pharmacist

## 2016-04-13 ENCOUNTER — Telehealth: Payer: Self-pay | Admitting: Pharmacist

## 2016-04-13 NOTE — Telephone Encounter (Signed)
04/13/16  Called patient to reschedule pharmacy clinic visit due to adverse weather conditions.  Patient was unable to provide with another date and wanted to check his schedule.  Patient will call to reschedule after looking at his calendar.  Bennye Alm, PharmD, [place] PGY2 Pharmacy Resident [phone]

## 2016-04-16 ENCOUNTER — Ambulatory Visit: Payer: Medicare Other | Admitting: Family Medicine

## 2016-05-12 ENCOUNTER — Other Ambulatory Visit: Payer: Self-pay

## 2016-05-12 MED ORDER — MOMETASONE FUROATE 50 MCG/ACT NA SUSP: 2.0000 | Freq: Every day | NASAL | 1 refills | Status: DC

## 2016-05-14 ENCOUNTER — Encounter: Payer: Self-pay | Admitting: Pain Medicine

## 2016-05-14 ENCOUNTER — Ambulatory Visit: Payer: Medicare Other | Attending: Pain Medicine | Admitting: Pain Medicine

## 2016-05-14 ENCOUNTER — Other Ambulatory Visit: Payer: Self-pay | Admitting: Family Medicine

## 2016-05-14 VITALS — BP 145/96 | Temp 97.5°F | Resp 18 | Ht 73.0 in | Wt 248.0 lb

## 2016-05-14 DIAGNOSIS — T402X5A Adverse effect of other opioids, initial encounter: Secondary | ICD-10-CM | POA: Insufficient documentation

## 2016-05-14 DIAGNOSIS — F329 Major depressive disorder, single episode, unspecified: Secondary | ICD-10-CM | POA: Diagnosis not present

## 2016-05-14 DIAGNOSIS — N189 Chronic kidney disease, unspecified: Secondary | ICD-10-CM | POA: Diagnosis not present

## 2016-05-14 DIAGNOSIS — F1190 Opioid use, unspecified, uncomplicated: Secondary | ICD-10-CM

## 2016-05-14 DIAGNOSIS — Z7982 Long term (current) use of aspirin: Secondary | ICD-10-CM | POA: Insufficient documentation

## 2016-05-14 DIAGNOSIS — M5441 Lumbago with sciatica, right side: Secondary | ICD-10-CM | POA: Insufficient documentation

## 2016-05-14 DIAGNOSIS — G8929 Other chronic pain: Secondary | ICD-10-CM | POA: Diagnosis present

## 2016-05-14 DIAGNOSIS — M25551 Pain in right hip: Secondary | ICD-10-CM | POA: Diagnosis not present

## 2016-05-14 DIAGNOSIS — Z794 Long term (current) use of insulin: Secondary | ICD-10-CM | POA: Diagnosis not present

## 2016-05-14 DIAGNOSIS — I129 Hypertensive chronic kidney disease with stage 1 through stage 4 chronic kidney disease, or unspecified chronic kidney disease: Secondary | ICD-10-CM | POA: Diagnosis not present

## 2016-05-14 DIAGNOSIS — M47816 Spondylosis without myelopathy or radiculopathy, lumbar region: Secondary | ICD-10-CM | POA: Insufficient documentation

## 2016-05-14 DIAGNOSIS — Z87891 Personal history of nicotine dependence: Secondary | ICD-10-CM | POA: Diagnosis not present

## 2016-05-14 DIAGNOSIS — Z79899 Other long term (current) drug therapy: Secondary | ICD-10-CM | POA: Insufficient documentation

## 2016-05-14 DIAGNOSIS — M5442 Lumbago with sciatica, left side: Secondary | ICD-10-CM | POA: Insufficient documentation

## 2016-05-14 DIAGNOSIS — M792 Neuralgia and neuritis, unspecified: Secondary | ICD-10-CM

## 2016-05-14 DIAGNOSIS — M4696 Unspecified inflammatory spondylopathy, lumbar region: Secondary | ICD-10-CM | POA: Insufficient documentation

## 2016-05-14 DIAGNOSIS — F419 Anxiety disorder, unspecified: Secondary | ICD-10-CM | POA: Insufficient documentation

## 2016-05-14 DIAGNOSIS — Z87442 Personal history of urinary calculi: Secondary | ICD-10-CM | POA: Insufficient documentation

## 2016-05-14 DIAGNOSIS — Z79891 Long term (current) use of opiate analgesic: Secondary | ICD-10-CM | POA: Insufficient documentation

## 2016-05-14 DIAGNOSIS — G894 Chronic pain syndrome: Secondary | ICD-10-CM | POA: Insufficient documentation

## 2016-05-14 DIAGNOSIS — Z9889 Other specified postprocedural states: Secondary | ICD-10-CM | POA: Insufficient documentation

## 2016-05-14 DIAGNOSIS — E785 Hyperlipidemia, unspecified: Secondary | ICD-10-CM | POA: Insufficient documentation

## 2016-05-14 DIAGNOSIS — E119 Type 2 diabetes mellitus without complications: Secondary | ICD-10-CM | POA: Insufficient documentation

## 2016-05-14 DIAGNOSIS — K5903 Drug induced constipation: Secondary | ICD-10-CM | POA: Insufficient documentation

## 2016-05-14 MED ORDER — OXYCODONE HCL 10 MG PO TABS: 10.0000 mg | ORAL_TABLET | Freq: Four times a day (QID) | ORAL | 0 refills | Status: DC | PRN

## 2016-05-14 MED ORDER — LUBIPROSTONE 24 MCG PO CAPS: 24.0000 ug | ORAL_CAPSULE | Freq: Two times a day (BID) | ORAL | 0 refills | Status: DC

## 2016-05-14 MED ORDER — PREGABALIN 75 MG PO CAPS: 75.0000 mg | ORAL_CAPSULE | Freq: Two times a day (BID) | ORAL | 0 refills | Status: DC

## 2016-05-14 NOTE — Patient Instructions (Signed)

## 2016-05-14 NOTE — Progress Notes (Signed)
Patient's Name: Alan Schmidt  MRN: [id number]  Referring Provider: [name], MD  DOB: 11-19-1945  PCP: [name], MD  DOS: 05/14/2016  Note by: Kathlen Brunswick. Dossie Arbour, MD  Service setting: Ambulatory outpatient  Specialty: Interventional Pain Management  Location: [facility] (AMB) Pain Management Facility    Patient type: Established   Primary Reason(s) for Visit: Encounter for prescription drug management (Level of risk: moderate) CC: Hip Pain (right)  HPI  Alan Schmidt is a 71 y.o. year old, male patient, who comes today for a medication management evaluation. He has Polyneuropathy; Paraparesis ([place]); Chronic low back pain (Location of Primary Source of Pain) (Bilateral) (R>L); Lumbar facet syndrome (Bilateral) (R>L); Lumbar spondylosis; Diabetic peripheral neuropathy ([place]); Long term current use of opiate analgesic; Long term prescription opiate use; Opiate use (60 MME/Day); Opiate dependence ([place]); Encounter for therapeutic drug level monitoring; Chronic neck pain (midline over the C7 spinous processes) (L>R); Neurogenic pain; Neuropathic pain; Contrast dye allergy; Chronic lower extremity pain (Location of Secondary source of pain) (Bilateral) (R>L); 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) (Right L5 dermatome); History of total hip replacement (Right); Chronic hip pain (Right); Class I Morbid obesity (HCC) (68% higher incidence of chronic low back pain); Essential hypertension; GERD (gastroesophageal reflux disease); Obstructive sleep apnea; Hyperlipidemia; Chronic kidney disease (CKD); Abnormal nerve conduction studies (severe bilateral lower extremity polyneuropathy); Chronic shoulder pain (Bilateral) (R>L); Occipital neuralgia (Left); Cervicogenic headache (Left); Diabetes ([place]); BPH (benign prostatic hyperplasia); Right ear pain; History of Vasovagal response to spinal injections; Allergic rhinitis; Anxiety and depression; Disturbance of skin sensation; Prostate cancer  screening; Chronic shoulder radicular pain (Left); Chronic cervical radicular pain (Left); Chronic pain syndrome; Cerumen impaction; and Opioid-induced constipation (OIC) on his problem list. His primarily concern today is the Hip Pain (right)  Pain Assessment: Self-Reported Pain Score: 3 /10             Reported level is compatible with observation.       Pain Type: Chronic pain Pain Location: Hip Pain Orientation: Right Pain Descriptors / Indicators: Aching Pain Frequency: Constant  Mr. Lotito was last scheduled for an appointment on 03/30/2016 for medication management. During today's appointment we reviewed [name]'s chronic pain status, as well as his outpatient medication regimen.  The patient  reports that he does not use drugs. His body mass index is 32.72 kg/m.  Further details on both, my assessment(s), as well as the proposed treatment plan, please see below.  Controlled Substance Pharmacotherapy Assessment REMS (Risk Evaluation and Mitigation Strategy)  Analgesic:Oxycodone IR 10 mg every 6 hours (40 mg/day) MME/day:60 mg/day.  Zenovia Jarred, RN  05/14/2016 10:56 AM  Signed Nursing Pain Medication Assessment:  Safety precautions to be maintained throughout the outpatient stay will include: orient to surroundings, keep bed in low position, maintain call bell within reach at all times, provide assistance with transfer out of bed and ambulation.  Medication Inspection Compliance: Pill count conducted under aseptic conditions, in front of the patient. Neither the pills nor the bottle was removed from the patient's sight at any time. Once count was completed pills were immediately returned to the patient in their original bottle.  Medication: Oxycodone IR Pill/Patch Count: 110 of 120 pills remain Pill/Patch Appearance: Markings consistent with prescribed medication Bottle Appearance: Standard pharmacy container. Clearly labeled. Filled Date:04 /09 / 2018 Last Medication  intake:  Today   Pharmacokinetics: Liberation and absorption (onset of action): WNL Distribution (time to peak effect): WNL Metabolism and excretion (duration of action): WNL  Pharmacodynamics: Desired effects: Analgesia: [name] reports >50% benefit. Functional ability: Patient reports that medication allows him to accomplish basic ADLs Clinically meaningful improvement in function (CMIF): Sustained CMIF goals met Perceived effectiveness: Described as relatively effective, allowing for increase in activities of daily living (ADL) Undesirable effects: Side-effects or Adverse reactions: None reported Monitoring: [place] PMP: Online review of the past 53-monthperiod conducted. Compliant with practice rules and regulations List of all UDS test(s) done:  Lab Results  Component Value Date   TOXASSSELUR FINAL 11/13/2015   TOXASSSELUR FINAL 03/06/2015   [place] 12/05[place]2014   Last UDS on record: ToxAssure Select 13  Date Value Ref Range Status  11/13/2015 FINAL  Final    Comment:    ==================================================================== TOXASSURE SELECT 13 (MW) ==================================================================== Test                             Result       Flag       Units Drug Present and Declared for Prescription Verification   Alpha-hydroxyalprazolam        73           EXPECTED   ng/mg creat    Alpha-hydroxyalprazolam is an expected metabolite of alprazolam.    Source of alprazolam is a scheduled prescription medication.   Oxycodone                      2098         EXPECTED   ng/mg creat   Noroxycodone                   1701         EXPECTED   ng/mg creat    Sources of oxycodone include scheduled prescription medications.    Noroxycodone is an expected metabolite of oxycodone. ==================================================================== Test                      Result    Flag   Units      Ref Range   Creatinine               84               mg/dL      >=20 ==================================================================== Declared Medications:  The flagging and interpretation on this report are based on the  following declared medications.  Unexpected results may arise from  inaccuracies in the declared medications.  **Note: The testing scope of this panel includes these medications:  Alprazolam  Oxycodone  **Note: The testing scope of this panel does not include following  reported medications:  Carvedilol (Coreg)  Desoximetasone (Topicort)  Fluoxetine (Prozac)  Hydrochlorothiazide  Hydroxyzine  Insulin (Levemir)  Insulin (NovoLog)  Liraglutide (Victoza)  Loratadine  Magnesium (Mag-Ox)  Metformin (Glucophage)  Mometasone (Nasonex)  Naloxone (Narcan)  Pravastatin (Pravachol)  Pregabalin (Lyrica)  Tamsulosin (Flomax)  Valsartan (Diovan)  Vitamin D2 (Drisdol)  Vitamin D3 ==================================================================== For clinical consultation, please call [phone] ====================================================================    UDS interpretation: Compliant          Medication Assessment Form: Reviewed. Patient indicates being compliant with therapy Treatment compliance: Compliant Risk Assessment Profile: Aberrant behavior: See prior evaluations. None observed or detected today Comorbid factors increasing risk of overdose: See prior notes. No additional risks detected today Risk of substance use disorder (SUD): Low Opioid Risk Tool (ORT) Total Score: 0  Interpretation Table:  Score <3 =  Low Risk for SUD  Score between 4-7 = Moderate Risk for SUD  Score >8 = High Risk for Opioid Abuse   Risk Mitigation Strategies:  Patient Counseling: Covered Patient-Prescriber Agreement (PPA): Present and active  Notification to other healthcare providers: Done  Pharmacologic Plan: No change in therapy, at this time  Laboratory Chemistry  Inflammation  Markers Lab Results  Component Value Date   CRP 0.5 03/06/2015   ESRSEDRATE 33 (H) 03/06/2015   (CRP: Acute Phase) (ESR: Chronic Phase) Renal Function Markers Lab Results  Component Value Date   BUN 14 03/23/2016   CREATININE 0.95 03/23/2016   GFRAA >60 03/06/2015   GFRNONAA >60 03/06/2015   Hepatic Function Markers Lab Results  Component Value Date   AST 25 03/23/2016   ALT 37 03/23/2016   ALBUMIN 4.0 03/23/2016   ALKPHOS 67 03/23/2016   Electrolytes Lab Results  Component Value Date   NA 137 03/23/2016   K 4.3 03/23/2016   CL 101 03/23/2016   CALCIUM 9.4 03/23/2016   MG 1.8 03/06/2015   Neuropathy Markers No results found for: [id number] Bone Pathology Markers Lab Results  Component Value Date   ALKPHOS 67 03/23/2016   CALCIUM 9.4 03/23/2016   Coagulation Parameters No results found for: INR, LABPROT, APTT, PLT Cardiovascular Markers No results found for: BNP, HGB, HCT Note: Lab results reviewed.  Recent Diagnostic Imaging Review  Dg C-arm 1-60 Min-no Report  Result Date: 02/17/2016 There is no Radiologist interpretation  for this exam.  Note: Imaging results reviewed.          Meds  The patient has a current medication list which includes the following prescription(s): accu-chek compact plus, alprazolam, aspirin ec, bd pen needle nano u/f, carvedilol, vitamin d3, desoximetasone, fluoxetine, hydrochlorothiazide, hydroxyzine, insulin lispro, lubiprostone, magnesium oxide, metformin, mometasone, naloxone, oxycodone hcl, oxycodone hcl, oxycodone hcl, pravastatin, pregabalin, tamsulosin, valsartan, victoza, and vitamin d (ergocalciferol).  Current Outpatient Prescriptions on File Prior to Visit  Medication Sig  . ACCU-CHEK COMPACT PLUS test strip USE 1 STRIP 7 TIMES DAILY TO TEST  . ALPRAZolam (XANAX) 0.5 MG tablet TAKE 1 TABLET TWICE A DAY AS NEEDED  . aspirin EC 325 MG tablet Take 325 mg by mouth daily.   . BD PEN NEEDLE NANO U/F 32G X 4 MM MISC USE EVERY  DAY  . carvedilol (COREG) 25 MG tablet Take 1 tablet (25 mg total) by mouth 2 (two) times daily.  . Cholecalciferol (VITAMIN D3) 2000 units capsule Take 2,000 Units by mouth daily.  Marland Kitchen desoximetasone (TOPICORT) 0.25 % cream APPLY CREAM TO AFFECTED AREA TWO TIMES DAILY  . FLUoxetine (PROZAC) 40 MG capsule TAKE ONE CAPSULE BY MOUTH TWO TIMES DAILY  . hydrochlorothiazide (HYDRODIURIL) 25 MG tablet Take 1 tablet (25 mg total) by mouth daily.  . hydrOXYzine (ATARAX/VISTARIL) 50 MG tablet TAKE 1 TABLET BY MOUTH FOUR TIMES A DAY AS NEEDED  . insulin lispro (HUMALOG KWIKPEN) 100 UNIT/ML KiwkPen Inject 0.12-0.18 mLs (12-18 Units total) into the skin 2 (two) times daily with a meal.  . magnesium oxide (MAG-OX) 400 (241.3 Mg) MG tablet Take 1 tablet (400 mg total) by mouth 2 (two) times daily.  . metFORMIN (GLUCOPHAGE) 1000 MG tablet Take 1 tablet (1,000 mg total) by mouth 2 (two) times daily with a meal.  . mometasone (NASONEX) 50 MCG/ACT nasal spray Place 2 sprays into the nose daily.  . naloxone (NARCAN) 2 MG/2ML injection Inject content of syringe into thigh muscle. Call 911.  . pravastatin (PRAVACHOL) 40  MG tablet Take 1 tablet (40 mg total) by mouth daily.  . tamsulosin (FLOMAX) 0.4 MG CAPS capsule Take 1 capsule (0.4 mg total) by mouth 2 (two) times daily.  . valsartan (DIOVAN) 320 MG tablet Take 1 tablet (320 mg total) by mouth daily.  Marland Kitchen VICTOZA 18 MG/3ML SOPN INJECT 0.3 MLS (1.8 MG TOTAL) INTO THE SKIN DAILY.  Marland Kitchen Vitamin D, Ergocalciferol, (DRISDOL) 50000 units CAPS capsule TAKE 1 CAPSULE BY MOUTH 2 TIMES A WEEK   No current facility-administered medications on file prior to visit.    ROS  Constitutional: Denies any fever or chills Gastrointestinal: No reported hemesis, hematochezia, vomiting, or acute GI distress Musculoskeletal: Denies any acute onset joint swelling, redness, loss of ROM, or weakness Neurological: No reported episodes of acute onset apraxia, aphasia, dysarthria, agnosia,  amnesia, paralysis, loss of coordination, or loss of consciousness  Allergies  Mr. Mchaffie is allergic to contrast media [iodinated diagnostic agents]; iodine; and shellfish allergy.  [place]  Drug: Mr. Cromartie  reports that he does not use drugs. Alcohol:  reports that he does not drink alcohol. Tobacco:  reports that he has quit smoking. He has never used smokeless tobacco. Medical:  has a past medical history of Anxiety; Chronic hip pain (Right) (12/05/2014); Chronic lumbar pain; Depression; Hyperlipidemia; Hypertension; Kidney stones; and Migraines. Family: family history includes Cancer in his mother; Heart disease in his father; Stroke in his father.  Past Surgical History:  Procedure Laterality Date  . right hip surgery     4 surgeries  . TONSILLECTOMY    . TOTAL HIP ARTHROPLASTY     Constitutional Exam  General appearance: Well nourished, well developed, and well hydrated. In no apparent acute distress Vitals:   05/14/16 1045  BP: (!) 145/96  Resp: 18  Temp: 97.5 F (36.4 C)  SpO2: 98%  Weight: 248 lb (112.5 kg)  Height: 6' 1"  (1.854 m)   BMI Assessment: Estimated body mass index is 32.72 kg/m as calculated from the following:   Height as of this encounter: 6' 1"  (1.854 m).   Weight as of this encounter: 248 lb (112.5 kg).  BMI interpretation table: BMI level Category Range association with higher incidence of chronic pain  <18 kg/m2 Underweight   18.5-24.9 kg/m2 Ideal body weight   25-29.9 kg/m2 Overweight Increased incidence by 20%  30-34.9 kg/m2 Obese (Class I) Increased incidence by 68%  35-39.9 kg/m2 Severe obesity (Class II) Increased incidence by 136%  >40 kg/m2 Extreme obesity (Class III) Increased incidence by 254%   BMI Readings from Last 4 Encounters:  05/14/16 32.72 kg/m  03/30/16 32.98 kg/m  03/23/16 33.17 kg/m  02/17/16 32.98 kg/m   Wt Readings from Last 4 Encounters:  05/14/16 248 lb (112.5 kg)  03/30/16 250 lb (113.4 kg)  03/23/16 251 lb  6.4 oz (114 kg)  02/17/16 250 lb (113.4 kg)  Psych/Mental status: Alert, oriented x 3 (person, place, & time)       Eyes: PERLA Respiratory: No evidence of acute respiratory distress  Cervical Spine Exam  Inspection: No masses, redness, or swelling Alignment: Symmetrical Functional ROM: Unrestricted ROM Stability: No instability detected Muscle strength & Tone: Functionally intact Sensory: Unimpaired Palpation: No palpable anomalies  Upper Extremity (UE) Exam    Side: Right upper extremity  Side: Left upper extremity  Inspection: No masses, redness, swelling, or asymmetry. No contractures  Inspection: No masses, redness, swelling, or asymmetry. No contractures  Functional ROM: Unrestricted ROM  Functional ROM: Unrestricted ROM          Muscle strength & Tone: Functionally intact  Muscle strength & Tone: Functionally intact  Sensory: Unimpaired  Sensory: Unimpaired  Palpation: No palpable anomalies  Palpation: No palpable anomalies  Specialized Test(s): Deferred         Specialized Test(s): Deferred          Thoracic Spine Exam  Inspection: No masses, redness, or swelling Alignment: Symmetrical Functional ROM: Unrestricted ROM Stability: No instability detected Sensory: Unimpaired Muscle strength & Tone: No palpable anomalies  Lumbar Spine Exam  Inspection: No masses, redness, or swelling Alignment: Symmetrical Functional ROM: Unrestricted ROM Stability: No instability detected Muscle strength & Tone: Functionally intact Sensory: Unimpaired Palpation: No palpable anomalies Provocative Tests: Lumbar Hyperextension and rotation test: evaluation deferred today       Patrick's Maneuver: evaluation deferred today              Gait & Posture Assessment  Ambulation: Unassisted Gait: Relatively normal for age and body habitus Posture: WNL   Lower Extremity Exam    Side: Right lower extremity  Side: Left lower extremity  Inspection: No masses, redness, swelling, or  asymmetry. No contractures  Inspection: No masses, redness, swelling, or asymmetry. No contractures  Functional ROM: Unrestricted ROM          Functional ROM: Unrestricted ROM          Muscle strength & Tone: Functionally intact  Muscle strength & Tone: Functionally intact  Sensory: Unimpaired  Sensory: Unimpaired  Palpation: No palpable anomalies  Palpation: No palpable anomalies   Assessment  Primary Diagnosis & Pertinent Problem List: The primary encounter diagnosis was Chronic low back pain (Location of Primary Source of Pain) (Bilateral) (R>L). Diagnoses of Lumbar facet syndrome (Bilateral) (R>L), Lumbar spondylosis, Chronic pain syndrome, Neuropathic pain, Neurogenic pain, Opioid-induced constipation (OIC), Long term prescription opiate use, and Opiate use (60 MME/Day) were also pertinent to this visit.  Status Diagnosis  Controlled Controlled Controlled 1. Chronic low back pain (Location of Primary Source of Pain) (Bilateral) (R>L)   2. Lumbar facet syndrome (Bilateral) (R>L)   3. Lumbar spondylosis   4. Chronic pain syndrome   5. Neuropathic pain   6. Neurogenic pain   7. Opioid-induced constipation (OIC)   8. Long term prescription opiate use   9. Opiate use (60 MME/Day)      Plan of Care  Pharmacotherapy (Medications Ordered): Meds ordered this encounter  Medications  . Oxycodone HCl 10 MG TABS    Sig: Take 1 tablet (10 mg total) by mouth every 6 (six) hours as needed.    Dispense:  120 tablet    Refill:  0    Do not place this medication, or any other prescription from our practice, on "Automatic Refill". Patient may have prescription filled one day early if pharmacy is closed on scheduled refill date. Do not fill until: 07/28/16 To last until: 08/27/16  . Oxycodone HCl 10 MG TABS    Sig: Take 1 tablet (10 mg total) by mouth every 6 (six) hours as needed.    Dispense:  120 tablet    Refill:  0    Do not place this medication, or any other prescription from our  practice, on "Automatic Refill". Patient may have prescription filled one day early if pharmacy is closed on scheduled refill date. Do not fill until: 06/28/16 To last until: 07/28/16  . Oxycodone HCl 10 MG TABS    Sig: Take 1 tablet (  10 mg total) by mouth every 6 (six) hours as needed.    Dispense:  120 tablet    Refill:  0    Do not place this medication, or any other prescription from our practice, on "Automatic Refill". Patient may have prescription filled one day early if pharmacy is closed on scheduled refill date. Do not fill until: 05/29/16 To last until: 06/28/16  . pregabalin (LYRICA) 75 MG capsule    Sig: Take 1 capsule (75 mg total) by mouth every 12 (twelve) hours.    Dispense:  180 capsule    Refill:  0    Do not place this medication, or any other prescription from our practice, on "Automatic Refill". Patient may have prescription filled one day early if pharmacy is closed on scheduled refill date.  . lubiprostone (AMITIZA) 24 MCG capsule    Sig: Take 1 capsule (24 mcg total) by mouth 2 (two) times daily with a meal. Swallow the medication whole. Do not break or chew the medication.    Dispense:  180 capsule    Refill:  0    Do not place this medication, or any other prescription from our practice, on "Automatic Refill". Patient may have prescription filled one day early if pharmacy is closed on scheduled refill date.   New Prescriptions   LUBIPROSTONE (AMITIZA) 24 MCG CAPSULE    Take 1 capsule (24 mcg total) by mouth 2 (two) times daily with a meal. Swallow the medication whole. Do not break or chew the medication.   Medications administered today: Mr. Venus had no medications administered during this visit. Lab-work, procedure(s), and/or referral(s): Orders Placed This Encounter  Procedures  . Radiofrequency,Lumbar   Imaging and/or referral(s): None  Interventional therapies: Planned, scheduled, and/or pending:   Not at this time.   Considering:   Palliative  bilateral lumbar facet RFA (Right done on 02/17/16 & 04/30/15) Diagnostic Left GONB Possible Left GON RFA Palliative Left CESI   Palliative PRN treatment(s):   Diagnostic Left GONB   Provider-requested follow-up: Return in about 3 months (around 08/13/2016) for (Nurse Practitioner) Med-Mgmt, in addition, Procedure: Left lumbar facet RFA.  Future Appointments Date Time Provider [place]  08/13/2016 10:30 AM Vevelyn Francois, NP [facility] None  09/16/2016 10:45 AM Milinda Pointer, MD [facility] None   Primary Care Physician: [name], MD Location: [facility] Outpatient [facility] Management Facility Note by: Kathlen Brunswick. Dossie Arbour, M.D, DABA, DABAPM, DABPM, DABIPP, FIPP Date: 05/14/2016; Time: 11:48 AM  Pain Score Disclaimer: We use the NRS-11 scale. This is a self-reported, subjective measurement of pain severity with only modest accuracy. It is used primarily to identify changes within a particular patient. It must be understood that outpatient pain scales are significantly less accurate that those used for research, where they can be applied under ideal controlled circumstances with minimal exposure to variables. In reality, the score is likely to be a combination of pain intensity and pain affect, where pain affect describes the degree of emotional arousal or changes in action readiness caused by the sensory experience of pain. Factors such as social and work situation, setting, emotional state, anxiety levels, expectation, and prior pain experience may influence pain perception and show large inter-individual differences that may also be affected by time variables.  Patient instructions provided during this appointment: Patient Instructions  Preparing for Procedure with Sedation Instructions: . Oral Intake: Do not eat or drink anything for at least 8 hours prior to your procedure. . Transportation: Public transportation is not allowed.  Bring an adult driver. The driver must be  physically present in our waiting room before any procedure can be started. Marland Kitchen Physical Assistance: Bring an adult physically capable of assisting you, in the event you need help. This adult should keep you company at home for at least 6 hours after the procedure. . Blood Pressure Medicine: Take your blood pressure medicine with a sip of water the morning of the procedure. . Blood thinners:  . Diabetics on insulin: Notify the staff so that you can be scheduled 1st case in the morning. If your diabetes requires high dose insulin, take only  of your normal insulin dose the morning of the procedure and notify the staff that you have done so. . Preventing infections: Shower with an antibacterial soap the morning of your procedure. . Build-up your immune system: Take 1000 mg of Vitamin C with every meal (3 times a day) the day prior to your procedure. Marland Kitchen Antibiotics: Inform the staff if you have a condition or reason that requires you to take antibiotics before dental procedures. . Pregnancy: If you are pregnant, call and cancel the procedure. . Sickness: If you have a cold, fever, or any active infections, call and cancel the procedure. . Arrival: You must be in the facility at least 30 minutes prior to your scheduled procedure. . Children: Do not bring children with you. . Dress appropriately: Bring dark clothing that you would not mind if they get stained. . Valuables: Do not bring any jewelry or valuables. Procedure appointments are reserved for interventional treatments only. Marland Kitchen No Prescription Refills. . No medication changes will be discussed during procedure appointments. . No disability issues will be discussed.  ____________________________________________________________________________________________

## 2016-05-14 NOTE — Progress Notes (Signed)
Nursing Pain Medication Assessment:  Safety precautions to be maintained throughout the outpatient stay will include: orient to surroundings, keep bed in low position, maintain call bell within reach at all times, provide assistance with transfer out of bed and ambulation.  Medication Inspection Compliance: Pill count conducted under aseptic conditions, in front of the patient. Neither the pills nor the bottle was removed from the patient's sight at any time. Once count was completed pills were immediately returned to the patient in their original bottle.  Medication: Oxycodone IR Pill/Patch Count: 110 of 120 pills remain Pill/Patch Appearance: Markings consistent with prescribed medication Bottle Appearance: Standard pharmacy container. Clearly labeled. Filled Date:04 /09 / 2018 Last Medication intake:  Today

## 2016-05-15 ENCOUNTER — Other Ambulatory Visit: Payer: Self-pay | Admitting: Family Medicine

## 2016-05-15 NOTE — Telephone Encounter (Signed)
Please find out what he takes this for. Thanks. 

## 2016-05-15 NOTE — Telephone Encounter (Signed)
Needs vitamin D checked prior to this being refilled. Thanks.

## 2016-05-15 NOTE — Telephone Encounter (Signed)
Lease advise for refill, last filled in November, thanks

## 2016-05-15 NOTE — Telephone Encounter (Signed)
Last OV 03/23/16 last filled 07/17/15 180 1rf

## 2016-05-18 NOTE — Telephone Encounter (Signed)
Left message to return call 

## 2016-05-18 NOTE — Telephone Encounter (Signed)
Patient states he takes this but is not sure what for

## 2016-05-18 NOTE — Telephone Encounter (Signed)
I would suggest we check a magnesium level prior to refilling. Thanks.

## 2016-05-19 NOTE — Telephone Encounter (Signed)
Left message to return call 

## 2016-05-19 NOTE — Telephone Encounter (Signed)
Please call pt at [phone]

## 2016-05-22 NOTE — Telephone Encounter (Signed)
Unable to reach patient.

## 2016-05-24 ENCOUNTER — Other Ambulatory Visit: Payer: Self-pay | Admitting: Family Medicine

## 2016-05-29 DIAGNOSIS — E119 Type 2 diabetes mellitus without complications: Secondary | ICD-10-CM | POA: Diagnosis not present

## 2016-05-29 DIAGNOSIS — B351 Tinea unguium: Secondary | ICD-10-CM | POA: Diagnosis not present

## 2016-05-29 DIAGNOSIS — L851 Acquired keratosis [keratoderma] palmaris et plantaris: Secondary | ICD-10-CM | POA: Diagnosis not present

## 2016-06-18 ENCOUNTER — Other Ambulatory Visit: Payer: Self-pay | Admitting: Family Medicine

## 2016-06-23 ENCOUNTER — Telehealth: Payer: Self-pay | Admitting: Pain Medicine

## 2016-06-23 NOTE — Telephone Encounter (Signed)
Having increased pain and not sure he can wait until August for a RF procedure. Can he be scheduled for any other procedure to help until the RF?

## 2016-06-23 NOTE — Telephone Encounter (Signed)
Juliann Pulse,      If patient needs a procedure prior to his RF he will need to come in and be evaluated for charting and insurance purposes first. If he feel as though he can't wait until August or his next med refill appointment, have him come in for an evaluation.

## 2016-06-26 ENCOUNTER — Other Ambulatory Visit: Payer: Self-pay | Admitting: Family Medicine

## 2016-06-26 NOTE — Telephone Encounter (Signed)
Phoned in to pharmacy. 

## 2016-06-26 NOTE — Telephone Encounter (Signed)
Last OV 03/23/16 last filled 03/16/16 60 0rf

## 2016-06-30 NOTE — Telephone Encounter (Signed)
06/30/16 Called patient today to attempt rescheduling his appointment from March as he has never called to reschedule.  Was unable to reach patient via telephone today.  Will followup within 1 week to attempt to reschedule  Bennye Alm, PharmD, [name] PGY2 Pharmacy Resident [phone]

## 2016-07-29 ENCOUNTER — Other Ambulatory Visit: Payer: Self-pay | Admitting: Family Medicine

## 2016-08-10 ENCOUNTER — Other Ambulatory Visit: Payer: Self-pay | Admitting: Pain Medicine

## 2016-08-10 DIAGNOSIS — T402X5A Adverse effect of other opioids, initial encounter: Principal | ICD-10-CM

## 2016-08-10 DIAGNOSIS — K5903 Drug induced constipation: Secondary | ICD-10-CM

## 2016-08-13 ENCOUNTER — Ambulatory Visit: Payer: Medicare Other | Attending: Nurse Practitioner | Admitting: Nurse Practitioner

## 2016-08-13 ENCOUNTER — Encounter: Payer: Self-pay | Admitting: Nurse Practitioner

## 2016-08-13 VITALS — BP 115/60 | HR 84 | Temp 96.5°F | Resp 16 | Ht 73.0 in | Wt 250.0 lb

## 2016-08-13 DIAGNOSIS — M5412 Radiculopathy, cervical region: Secondary | ICD-10-CM | POA: Insufficient documentation

## 2016-08-13 DIAGNOSIS — Z6832 Body mass index (BMI) 32.0-32.9, adult: Secondary | ICD-10-CM | POA: Diagnosis not present

## 2016-08-13 DIAGNOSIS — M25512 Pain in left shoulder: Secondary | ICD-10-CM | POA: Insufficient documentation

## 2016-08-13 DIAGNOSIS — G4733 Obstructive sleep apnea (adult) (pediatric): Secondary | ICD-10-CM | POA: Insufficient documentation

## 2016-08-13 DIAGNOSIS — K5903 Drug induced constipation: Secondary | ICD-10-CM | POA: Diagnosis not present

## 2016-08-13 DIAGNOSIS — Z91013 Allergy to seafood: Secondary | ICD-10-CM | POA: Insufficient documentation

## 2016-08-13 DIAGNOSIS — M47816 Spondylosis without myelopathy or radiculopathy, lumbar region: Secondary | ICD-10-CM | POA: Diagnosis not present

## 2016-08-13 DIAGNOSIS — F419 Anxiety disorder, unspecified: Secondary | ICD-10-CM | POA: Insufficient documentation

## 2016-08-13 DIAGNOSIS — Z8249 Family history of ischemic heart disease and other diseases of the circulatory system: Secondary | ICD-10-CM | POA: Insufficient documentation

## 2016-08-13 DIAGNOSIS — Z794 Long term (current) use of insulin: Secondary | ICD-10-CM | POA: Insufficient documentation

## 2016-08-13 DIAGNOSIS — J309 Allergic rhinitis, unspecified: Secondary | ICD-10-CM | POA: Diagnosis not present

## 2016-08-13 DIAGNOSIS — N189 Chronic kidney disease, unspecified: Secondary | ICD-10-CM | POA: Insufficient documentation

## 2016-08-13 DIAGNOSIS — Z79891 Long term (current) use of opiate analgesic: Secondary | ICD-10-CM | POA: Insufficient documentation

## 2016-08-13 DIAGNOSIS — G894 Chronic pain syndrome: Secondary | ICD-10-CM | POA: Diagnosis not present

## 2016-08-13 DIAGNOSIS — G4486 Cervicogenic headache: Secondary | ICD-10-CM

## 2016-08-13 DIAGNOSIS — N400 Enlarged prostate without lower urinary tract symptoms: Secondary | ICD-10-CM | POA: Diagnosis not present

## 2016-08-13 DIAGNOSIS — Z87442 Personal history of urinary calculi: Secondary | ICD-10-CM | POA: Insufficient documentation

## 2016-08-13 DIAGNOSIS — K219 Gastro-esophageal reflux disease without esophagitis: Secondary | ICD-10-CM | POA: Insufficient documentation

## 2016-08-13 DIAGNOSIS — Z96641 Presence of right artificial hip joint: Secondary | ICD-10-CM | POA: Diagnosis not present

## 2016-08-13 DIAGNOSIS — E1142 Type 2 diabetes mellitus with diabetic polyneuropathy: Secondary | ICD-10-CM | POA: Diagnosis not present

## 2016-08-13 DIAGNOSIS — Z823 Family history of stroke: Secondary | ICD-10-CM | POA: Insufficient documentation

## 2016-08-13 DIAGNOSIS — M25511 Pain in right shoulder: Secondary | ICD-10-CM | POA: Diagnosis not present

## 2016-08-13 DIAGNOSIS — Z91041 Radiographic dye allergy status: Secondary | ICD-10-CM | POA: Insufficient documentation

## 2016-08-13 DIAGNOSIS — R51 Headache: Secondary | ICD-10-CM | POA: Insufficient documentation

## 2016-08-13 DIAGNOSIS — M5481 Occipital neuralgia: Secondary | ICD-10-CM | POA: Insufficient documentation

## 2016-08-13 DIAGNOSIS — G8929 Other chronic pain: Secondary | ICD-10-CM | POA: Diagnosis not present

## 2016-08-13 DIAGNOSIS — H9201 Otalgia, right ear: Secondary | ICD-10-CM | POA: Insufficient documentation

## 2016-08-13 DIAGNOSIS — T402X5A Adverse effect of other opioids, initial encounter: Secondary | ICD-10-CM | POA: Diagnosis not present

## 2016-08-13 DIAGNOSIS — F329 Major depressive disorder, single episode, unspecified: Secondary | ICD-10-CM | POA: Diagnosis not present

## 2016-08-13 DIAGNOSIS — M542 Cervicalgia: Secondary | ICD-10-CM | POA: Insufficient documentation

## 2016-08-13 DIAGNOSIS — Z87891 Personal history of nicotine dependence: Secondary | ICD-10-CM | POA: Insufficient documentation

## 2016-08-13 DIAGNOSIS — G8220 Paraplegia, unspecified: Secondary | ICD-10-CM | POA: Insufficient documentation

## 2016-08-13 DIAGNOSIS — M792 Neuralgia and neuritis, unspecified: Secondary | ICD-10-CM

## 2016-08-13 DIAGNOSIS — Z5181 Encounter for therapeutic drug level monitoring: Secondary | ICD-10-CM | POA: Diagnosis not present

## 2016-08-13 DIAGNOSIS — Z7982 Long term (current) use of aspirin: Secondary | ICD-10-CM | POA: Insufficient documentation

## 2016-08-13 DIAGNOSIS — I129 Hypertensive chronic kidney disease with stage 1 through stage 4 chronic kidney disease, or unspecified chronic kidney disease: Secondary | ICD-10-CM | POA: Insufficient documentation

## 2016-08-13 DIAGNOSIS — E785 Hyperlipidemia, unspecified: Secondary | ICD-10-CM | POA: Insufficient documentation

## 2016-08-13 DIAGNOSIS — E1122 Type 2 diabetes mellitus with diabetic chronic kidney disease: Secondary | ICD-10-CM | POA: Insufficient documentation

## 2016-08-13 DIAGNOSIS — Z79899 Other long term (current) drug therapy: Secondary | ICD-10-CM | POA: Insufficient documentation

## 2016-08-13 MED ORDER — OXYCODONE HCL 10 MG PO TABS: 10.0000 mg | ORAL_TABLET | Freq: Four times a day (QID) | ORAL | 0 refills | Status: DC | PRN

## 2016-08-13 MED ORDER — PREGABALIN 75 MG PO CAPS: 75.0000 mg | ORAL_CAPSULE | Freq: Two times a day (BID) | ORAL | 0 refills | Status: DC

## 2016-08-13 MED ORDER — VITAMIN D3 50 MCG (2000 UT) PO CAPS: 2000.0000 [IU] | ORAL_CAPSULE | Freq: Every day | ORAL | 2 refills | Status: AC

## 2016-08-13 MED ORDER — LUBIPROSTONE 24 MCG PO CAPS: 24.0000 ug | ORAL_CAPSULE | Freq: Two times a day (BID) | ORAL | 0 refills | Status: DC

## 2016-08-13 MED ORDER — MAGNESIUM OXIDE 400 (241.3 MG) MG PO TABS: 1.0000 | ORAL_TABLET | Freq: Two times a day (BID) | ORAL | 0 refills | Status: AC

## 2016-08-13 NOTE — Patient Instructions (Addendum)
You have been give 3 scripts for oxycodone and 1 for Lyrica.  ____________________________________________________________________________________________  Medication Rules  Applies to: All patients receiving prescriptions (written or electronic).  Pharmacy of record: Pharmacy where electronic prescriptions will be sent. If written prescriptions are taken to a different pharmacy, please inform the nursing staff. The pharmacy listed in the electronic medical record should be the one where you would like electronic prescriptions to be sent.  Prescription refills: Only during scheduled appointments. Applies to both, written and electronic prescriptions.  NOTE: The following applies primarily to controlled substances (Opioid* Pain Medications).   Patient's responsibilities: 1. Pain Pills: Bring all pain pills to every appointment (except for procedure appointments). 2. Pill Bottles: Bring pills in original pharmacy bottle. Always bring newest bottle. Bring bottle, even if empty. 3. Medication refills: You are responsible for knowing and keeping track of what medications you need refilled. The day before your appointment, write a list of all prescriptions that need to be refilled. Bring that list to your appointment and give it to the admitting nurse. Prescriptions will be written only during appointments. If you forget a medication, it will not be "Called in", "Faxed", or "electronically sent". You will need to get another appointment to get these prescribed. 4. Prescription Accuracy: You are responsible for carefully inspecting your prescriptions before leaving our office. Have the discharge nurse carefully go over each prescription with you, before taking them home. Make sure that your name is accurately spelled, that your address is correct. Check the name and dose of your medication to make sure it is accurate. Check the number of pills, and the written instructions to make sure they are clear and  accurate. Make sure that you are given enough medication to last until your next medication refill appointment. 5. Taking Medication: Take medication as prescribed. Never take more pills than instructed. Never take medication more frequently than prescribed. Taking less pills or less frequently is permitted and encouraged, when it comes to controlled substances (written prescriptions).  6. Inform other Doctors: Always inform, all of your healthcare providers, of all the medications you take. 7. Pain Medication from other Providers: You are not allowed to accept any additional pain medication from any other Doctor or Healthcare provider. There are two exceptions to this rule. (see below) In the event that you require additional pain medication, you are responsible for notifying us, as stated below. 8. Medication Agreement: You are responsible for carefully reading and following our Medication Agreement. This must be signed before receiving any prescriptions from our practice. Safely store a copy of your signed Agreement. Violations to the Agreement will result in no further prescriptions. (Additional copies of our Medication Agreement are available upon request.) 9. Laws, Rules, & Regulations: All patients are expected to follow all Federal and [employer], [employer], Rules, [employer]. Ignorance of the Laws does not constitute a valid excuse. The use of any illegal substances is prohibited. 10. Adopted CDC guidelines & recommendations: Target dosing levels will be at or below 60 MME/day. Use of benzodiazepines** is not recommended.  Exceptions: There are only two exceptions to the rule of not receiving pain medications from other Healthcare Providers. 1. Exception #1 (Emergencies): In the event of an emergency (i.e.: accident requiring emergency care), you are allowed to receive additional pain medication. However, you are responsible for: As soon as you are able, call our office [phone], at any  time of the day or night, and leave a message stating your name, the date  and nature of the emergency, and the name and dose of the medication prescribed. In the event that your call is answered by a member of our staff, make sure to document and save the date, time, and the name of the person that took your information.  2. Exception #2 (Planned Surgery): In the event that you are scheduled by another doctor or dentist to have any type of surgery or procedure, you are allowed (for a period no longer than 30 days), to receive additional pain medication, for the acute post-op pain. However, in this case, you are responsible for picking up a copy of our "Post-op Pain Management for Surgeons" handout, and giving it to your surgeon or dentist. This document is available at our office, and does not require an appointment to obtain it. Simply go to our office during business hours (Monday-Thursday from 8:00 AM to 4:00 PM) (Friday 8:00 AM to 12:00 Noon) or if you have a scheduled appointment with [place], prior to your surgery, and ask for it by name. In addition, you will need to provide [place] with your name, name of your surgeon, type of surgery, and date of procedure or surgery.  *Opioid medications include: morphine, codeine, oxycodone, oxymorphone, hydrocodone, hydromorphone, meperidine, tramadol, tapentadol, buprenorphine, fentanyl, methadone. **Benzodiazepine medications include: diazepam (Valium), alprazolam (Xanax), clonazepam (Klonopine), lorazepam (Ativan), clorazepate (Tranxene), chlordiazepoxide (Librium), estazolam (Prosom), oxazepam (Serax), temazepam (Restoril), triazolam (Halcion)  ____________________________________________________________________________________________

## 2016-08-13 NOTE — Progress Notes (Signed)
Nursing Pain Medication Assessment:  Safety precautions to be maintained throughout the outpatient stay will include: orient to surroundings, keep bed in low position, maintain call bell within reach at all times, provide assistance with transfer out of bed and ambulation.  Medication Inspection Compliance: Pill count conducted under aseptic conditions, in front of the patient. Neither the pills nor the bottle was removed from the patient's sight at any time. Once count was completed pills were immediately returned to the patient in their original bottle.  Medication: Morphine IR Pill/Patch Count: 104 of 120 pills remain Pill/Patch Appearance: Markings consistent with prescribed medication Bottle Appearance: Standard pharmacy container. Clearly labeled. Filled Date: 07 / 06 / 2018 Last Medication intake:  Today

## 2016-08-13 NOTE — Progress Notes (Signed)
Patient's Name: Alan Schmidt  MRN: [id number]  Referring Provider: [name], MD  DOB: 06-20-1945  PCP: [name], MD  DOS: 08/13/2016  Note by: Vevelyn Francois NP  Service setting: Ambulatory outpatient  Specialty: Interventional Pain Management  Location: [facility] (AMB) Pain Management Facility    Patient type: Established    Primary Reason(s) for Visit: Encounter for prescription drug management. (Level of risk: moderate)  CC: Neck Pain (radiates to shoulders) and Back Pain (lower)  HPI  [name] is a 71 y.o. year old, male patient, who comes today for a medication management evaluation. He has Polyneuropathy; Paraparesis ([place]); Chronic low back pain (Location of Primary Source of Pain) (Bilateral) (R>L); Lumbar facet syndrome (Bilateral) (R>L); Lumbar spondylosis; Diabetic peripheral neuropathy ([place]); Long term current use of opiate analgesic; Long term prescription opiate use; Opiate use (60 MME/Day); Opiate dependence ([place]); Encounter for therapeutic drug level monitoring; Chronic neck pain (midline over the C7 spinous processes) (L>R); Neurogenic pain; Neuropathic pain; Contrast dye allergy; Chronic lower extremity pain (Location of Secondary source of pain) (Bilateral) (R>L); 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) (Right L5 dermatome); History of total hip replacement (Right); Chronic hip pain (Right); Class I Morbid obesity (HCC) (68% higher incidence of chronic low back pain); Essential hypertension; GERD (gastroesophageal reflux disease); Obstructive sleep apnea; Hyperlipidemia; Chronic kidney disease (CKD); Abnormal nerve conduction studies (severe bilateral lower extremity polyneuropathy); Chronic shoulder pain (Bilateral) (R>L); Occipital neuralgia (Left); Cervicogenic headache (Left); Diabetes ([place]); BPH (benign prostatic hyperplasia); Right ear pain; History of Vasovagal response to spinal injections; Allergic rhinitis; Anxiety and depression; Disturbance of  skin sensation; Prostate cancer screening; Chronic shoulder radicular pain (Left); Chronic cervical radicular pain (Left); Chronic pain syndrome; Cerumen impaction; and Opioid-induced constipation (OIC) on his problem list. His primarily concern today is the Neck Pain (radiates to shoulders) and Back Pain (lower)  Pain Assessment: Location: Lower Back Radiating:   Onset: More than a month ago Duration: Chronic pain Quality: Radiating, Aching, Constant Severity: 2 /10 (self-reported pain score)  Note: Reported level is compatible with observation.                   Effect on ADL:   Timing: Constant Modifying factors: medications  [name] was last scheduled for an appointment on 05/14/2016 for medication management. During today's appointment we reviewed [name]'s chronic pain status, as well as his outpatient medication regimen. He states that his back pain is doing better today. He is having neck pain that his radiates into his shoulders. He admits that he is so much better with the treatment that he has received here.   The patient  reports that he does not use drugs. His body mass index is 32.98 kg/m.  Further details on both, my assessment(s), as well as the proposed treatment plan, please see below.  Controlled Substance Pharmacotherapy Assessment REMS (Risk Evaluation and Mitigation Strategy)  Analgesic:Oxycodone IR 10 mg every 6 hours (40 mg/day) MME/day:60 mg/day.  Hart Rochester, RN  08/13/2016 11:10 AM  Sign at close encounter Nursing Pain Medication Assessment:  Safety precautions to be maintained throughout the outpatient stay will include: orient to surroundings, keep bed in low position, maintain call bell within reach at all times, provide assistance with transfer out of bed and ambulation.  Medication Inspection Compliance: Pill count conducted under aseptic conditions, in front of the patient. Neither the pills nor the bottle was removed from the patient's  sight at any time. Once count was  completed pills were immediately returned to the patient in their original bottle.  Medication: Morphine IR Pill/Patch Count: 104 of 120 pills remain Pill/Patch Appearance: Markings consistent with prescribed medication Bottle Appearance: Standard pharmacy container. Clearly labeled. Filled Date: 07 / 06 / 2018 Last Medication intake:  Today   Pharmacokinetics: Liberation and absorption (onset of action): WNL Distribution (time to peak effect): WNL Metabolism and excretion (duration of action): WNL         Pharmacodynamics: Desired effects: Analgesia: [name] reports >50% benefit. Functional ability: Patient reports that medication allows him to accomplish basic ADLs Clinically meaningful improvement in function (CMIF): Sustained CMIF goals met Perceived effectiveness: Described as relatively effective, allowing for increase in activities of daily living (ADL) Undesirable effects: Side-effects or Adverse reactions: None reported Monitoring: [place] PMP: Online review of the past 27-monthperiod conducted. Compliant with practice rules and regulations List of all UDS test(s) done:  Lab Results  Component Value Date   TOXASSSELUR FINAL 11/13/2015   TOXASSSELUR FINAL 03/06/2015   [place]AL 12/05/2014   Last UDS on record: ToxAssure Select 13  Date Value Ref Range Status  11/13/2015 FINAL  Final    Comment:    ==================================================================== TOXASSURE SELECT 13 (MW) ==================================================================== Test                             Result       Flag       Units Drug Present and Declared for Prescription Verification   Alpha-hydroxyalprazolam        73           EXPECTED   ng/mg creat    Alpha-hydroxyalprazolam is an expected metabolite of alprazolam.    Source of alprazolam is a scheduled prescription medication.   Oxycodone                      2098          EXPECTED   ng/mg creat   Noroxycodone                   1701         EXPECTED   ng/mg creat    Sources of oxycodone include scheduled prescription medications.    Noroxycodone is an expected metabolite of oxycodone. ==================================================================== Test                      Result    Flag   Units      Ref Range   Creatinine              84               mg/dL      >=20 ==================================================================== Declared Medications:  The flagging and interpretation on this report are based on the  following declared medications.  Unexpected results may arise from  inaccuracies in the declared medications.  **Note: The testing scope of this panel includes these medications:  Alprazolam  Oxycodone  **Note: The testing scope of this panel does not include following  reported medications:  Carvedilol (Coreg)  Desoximetasone (Topicort)  Fluoxetine (Prozac)  Hydrochlorothiazide  Hydroxyzine  Insulin (Levemir)  Insulin (NovoLog)  Liraglutide (Victoza)  Loratadine  Magnesium (Mag-Ox)  Metformin (Glucophage)  Mometasone (Nasonex)  Naloxone (Narcan)  Pravastatin (Pravachol)  Pregabalin (Lyrica)  Tamsulosin (Flomax)  Valsartan (Diovan)  Vitamin D2 (Drisdol)  Vitamin D3 ==================================================================== For clinical consultation,  please call [phone]. ====================================================================    UDS interpretation: Compliant          Medication Assessment Form: Reviewed. Patient indicates being compliant with therapy Treatment compliance: Compliant Risk Assessment Profile: Aberrant behavior: See prior evaluations. None observed or detected today Comorbid factors increasing risk of overdose: See prior notes. No additional risks detected today Risk of substance use disorder (SUD): Low Opioid Risk Tool (ORT) Total Score: 0  Interpretation Table:  Score  <3 = Low Risk for SUD  Score between 4-7 = Moderate Risk for SUD  Score >8 = High Risk for Opioid Abuse   Risk Mitigation Strategies:  Patient Counseling: Covered Patient-Prescriber Agreement (PPA): Present and active  Notification to other healthcare providers: Done  Pharmacologic Plan: No change in therapy, at this time  Laboratory Chemistry  Inflammation Markers (CRP: Acute Phase) (ESR: Chronic Phase) Lab Results  Component Value Date   CRP 0.5 03/06/2015   ESRSEDRATE 33 (H) 03/06/2015                 Renal Function Markers Lab Results  Component Value Date   BUN 14 03/23/2016   CREATININE 0.95 03/23/2016   GFRAA >60 03/06/2015   GFRNONAA >60 03/06/2015                 Hepatic Function Markers Lab Results  Component Value Date   AST 25 03/23/2016   ALT 37 03/23/2016   ALBUMIN 4.0 03/23/2016   ALKPHOS 67 03/23/2016                 Electrolytes Lab Results  Component Value Date   NA 137 03/23/2016   K 4.3 03/23/2016   CL 101 03/23/2016   CALCIUM 9.4 03/23/2016   MG 1.8 03/06/2015                 Neuropathy Markers No results found for: LOVFIEPP29               Bone Pathology Markers Lab Results  Component Value Date   ALKPHOS 67 03/23/2016   CALCIUM 9.4 03/23/2016                 Coagulation Parameters No results found for: INR, LABPROT, APTT, PLT               Cardiovascular Markers No results found for: BNP, HGB, HCT               Note: Lab results reviewed.  Recent Diagnostic Imaging Review  Dg C-arm 1-60 Min-no Report  Result Date: 02/17/2016 There is no Radiologist interpretation  for this exam.  Note: Imaging results reviewed.          Meds   Current Meds  Medication Sig  . ACCU-CHEK COMPACT PLUS test strip USE 1 STRIP 7 TIMES DAILY TO TEST  . ALPRAZolam (XANAX) 0.5 MG tablet TAKE 1 TABLET TWICE A DAY AS NEEDED  . aspirin EC 325 MG tablet Take 325 mg by mouth daily.   . BD PEN NEEDLE NANO U/F 32G X 4 MM MISC USE EVERY DAY  .  carvedilol (COREG) 25 MG tablet TAKE 1 TABLET (25 MG TOTAL) BY MOUTH 2 (TWO) TIMES DAILY.  Marland Kitchen Cholecalciferol (VITAMIN D3) 2000 units capsule Take 1 capsule (2,000 Units total) by mouth daily.  Marland Kitchen desoximetasone (TOPICORT) 0.25 % cream APPLY CREAM TO AFFECTED AREA TWO TIMES DAILY  . FLUoxetine (PROZAC) 40 MG capsule TAKE ONE CAPSULE BY MOUTH TWO TIMES DAILY  .  hydrochlorothiazide (HYDRODIURIL) 25 MG tablet TAKE 1 TABLET (25 MG TOTAL) BY MOUTH DAILY.  . hydrOXYzine (ATARAX/VISTARIL) 50 MG tablet TAKE 1 TABLET BY MOUTH FOUR TIMES A DAY AS NEEDED  . insulin lispro (HUMALOG KWIKPEN) 100 UNIT/ML KiwkPen Inject 0.12-0.18 mLs (12-18 Units total) into the skin 2 (two) times daily with a meal.  . [START ON 08/27/2016] magnesium oxide (MAG-OX) 400 (241.3 Mg) MG tablet Take 1 tablet (400 mg total) by mouth 2 (two) times daily.  . metFORMIN (GLUCOPHAGE) 1000 MG tablet TAKE 1 TABLET (1,000 MG TOTAL) BY MOUTH 2 (TWO) TIMES DAILY WITH A MEAL.  . naloxone (NARCAN) 2 MG/2ML injection Inject content of syringe into thigh muscle. Call 911.  . pravastatin (PRAVACHOL) 40 MG tablet Take 1 tablet (40 mg total) by mouth daily.  . tamsulosin (FLOMAX) 0.4 MG CAPS capsule Take 1 capsule (0.4 mg total) by mouth 2 (two) times daily.  . valsartan (DIOVAN) 320 MG tablet TAKE 1 TABLET (320 MG TOTAL) BY MOUTH DAILY.  Marland Kitchen VICTOZA 18 MG/3ML SOPN INJECT 0.3 MLS (1.8 MG TOTAL) INTO THE SKIN DAILY.  . [DISCONTINUED] Cholecalciferol (VITAMIN D3) 2000 units capsule Take 2,000 Units by mouth daily.  . [DISCONTINUED] lubiprostone (AMITIZA) 24 MCG capsule Take 1 capsule (24 mcg total) by mouth 2 (two) times daily with a meal. Swallow the medication whole. Do not break or chew the medication.  . [DISCONTINUED] magnesium oxide (MAG-OX) 400 (241.3 Mg) MG tablet Take 1 tablet (400 mg total) by mouth 2 (two) times daily.  . [DISCONTINUED] Oxycodone HCl 10 MG TABS Take 1 tablet (10 mg total) by mouth every 6 (six) hours as needed.  . [DISCONTINUED]  pregabalin (LYRICA) 75 MG capsule Take 1 capsule (75 mg total) by mouth every 12 (twelve) hours.    ROS  Constitutional: Denies any fever or chills Gastrointestinal: No reported hemesis, hematochezia, vomiting, or acute GI distress Musculoskeletal: Denies any acute onset joint swelling, redness, loss of ROM, or weakness Neurological: No reported episodes of acute onset apraxia, aphasia, dysarthria, agnosia, amnesia, paralysis, loss of coordination, or loss of consciousness  Allergies  Mr. Sylve is allergic to contrast media [iodinated diagnostic agents]; iodine; and shellfish allergy.  [place]  Drug: Mr. Brandl  reports that he does not use drugs. Alcohol:  reports that he does not drink alcohol. Tobacco:  reports that he has quit smoking. He has never used smokeless tobacco. Medical:  has a past medical history of Anxiety; Chronic hip pain (Right) (12/05/2014); Chronic lumbar pain; Depression; Hyperlipidemia; Hypertension; Kidney stones; and Migraines. Surgical: Mr. Zapata  has a past surgical history that includes Total hip arthroplasty; Tonsillectomy; and right hip surgery. Family: family history includes Cancer in his mother; Heart disease in his father; Stroke in his father.  Constitutional Exam  General appearance: Well nourished, well developed, and well hydrated. In no apparent acute distress Vitals:   08/13/16 1055  BP: 115/60  Pulse: 84  Resp: 16  Temp: (!) 96.5 F (35.8 C)  TempSrc: Oral  SpO2: 100%  Weight: 250 lb (113.4 kg)  Height: 6' 1"  (1.854 m)   BMI Assessment: Estimated body mass index is 32.98 kg/m as calculated from the following:   Height as of this encounter: 6' 1"  (1.854 m).   Weight as of this encounter: 250 lb (113.4 kg).  BMI interpretation table: BMI level Category Range association with higher incidence of chronic pain  <18 kg/m2 Underweight   18.5-24.9 kg/m2 Ideal body weight   25-29.9 kg/m2 Overweight Increased incidence by  20%  30-34.9  kg/m2 Obese (Class I) Increased incidence by 68%  35-39.9 kg/m2 Severe obesity (Class II) Increased incidence by 136%  >40 kg/m2 Extreme obesity (Class III) Increased incidence by 254%   BMI Readings from Last 4 Encounters:  08/13/16 32.98 kg/m  05/14/16 32.72 kg/m  03/30/16 32.98 kg/m  03/23/16 33.17 kg/m   Wt Readings from Last 4 Encounters:  08/13/16 250 lb (113.4 kg)  05/14/16 248 lb (112.5 kg)  03/30/16 250 lb (113.4 kg)  03/23/16 251 lb 6.4 oz (114 kg)  Psych/Mental status: Alert, oriented x 3 (person, place, & time)       Eyes: PERLA Respiratory: No evidence of acute respiratory distress  Cervical Spine Exam  Inspection: No masses, redness, or swelling Alignment: Symmetrical Functional ROM: Unrestricted ROM      Stability: No instability detected Muscle strength & Tone: Functionally intact Sensory: Unimpaired Palpation: No palpable anomalies              Upper Extremity (UE) Exam    Side: Right upper extremity  Side: Left upper extremity  Inspection: No masses, redness, swelling, or asymmetry. No contractures  Inspection: No masses, redness, swelling, or asymmetry. No contractures  Functional ROM: Unrestricted ROM          Functional ROM: Unrestricted ROM          Muscle strength & Tone: Functionally intact  Muscle strength & Tone: Functionally intact  Sensory: Unimpaired  Sensory: Unimpaired  Palpation: No palpable anomalies              Palpation: No palpable anomalies              Specialized Test(s): Deferred         Specialized Test(s): Deferred          Thoracic Spine Exam  Inspection: No masses, redness, or swelling Alignment: Symmetrical Functional ROM: Unrestricted ROM Stability: No instability detected Sensory: Unimpaired Muscle strength & Tone: No palpable anomalies  Lumbar Spine Exam  Inspection: No masses, redness, or swelling Alignment: Symmetrical Functional ROM: Unrestricted ROM      Stability: No instability detected Muscle strength &  Tone: Functionally intact Sensory: Unimpaired Palpation: No palpable anomalies       Provocative Tests: Lumbar Hyperextension and rotation test: evaluation deferred today       Patrick's Maneuver: evaluation deferred today                    Gait & Posture Assessment  Ambulation: Unassisted Gait: Relatively normal for age and body habitus Posture: WNL   Lower Extremity Exam    Side: Right lower extremity  Side: Left lower extremity  Inspection: No masses, redness, swelling, or asymmetry. No contractures  Inspection: No masses, redness, swelling, or asymmetry. No contractures  Functional ROM: Unrestricted ROM          Functional ROM: Unrestricted ROM          Muscle strength & Tone: Functionally intact  Muscle strength & Tone: Functionally intact  Sensory: Unimpaired  Sensory: Unimpaired  Palpation: No palpable anomalies  Palpation: No palpable anomalies   Assessment  Primary Diagnosis & Pertinent Problem List: The primary encounter diagnosis was Chronic neck pain (midline over the C7 spinous processes) (L>R). Diagnoses of Cervicogenic headache (Left), Lumbar spondylosis, Neuropathic pain, Neurogenic pain, Chronic pain syndrome, Opioid-induced constipation (OIC), and Long term current use of opiate analgesic were also pertinent to this visit.  Status Diagnosis  Controlled Controlled Controlled 1. Chronic neck pain (  midline over the C7 spinous processes) (L>R)   2. Cervicogenic headache (Left)   3. Lumbar spondylosis   4. Neuropathic pain   5. Neurogenic pain   6. Chronic pain syndrome   7. Opioid-induced constipation (OIC)   8. Long term current use of opiate analgesic     Problems updated and reviewed during this visit: Problem  Chronic Pain Syndrome  Chronic shoulder radicular pain (Left)  Chronic cervical radicular pain (Left)  Disturbance of Skin Sensation  Chronic shoulder pain (Bilateral) (R>L)  Occipital neuralgia (Left)  Cervicogenic headache (Left)  Chronic low  back pain (Location of Primary Source of Pain) (Bilateral) (R>L)  Lumbar facet syndrome (Bilateral) (R>L)   Confirmed by diagnostic bilateral lumbar facet block under fluoroscopic guidance.   Lumbar Spondylosis  Diabetic Peripheral Neuropathy (Hcc)  Chronic neck pain (midline over the C7 spinous processes) (L>R)  Neurogenic Pain  Neuropathic Pain  Chronic lower extremity pain (Location of Secondary source of pain) (Bilateral) (R>L)   The pain is over the area of the hamstrings going down to the back of the knee through the posterior aspect of the leg, suggesting referred pain from the lumbar facets.   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) (Right L5 dermatome)  History of total hip replacement (Right)  Chronic hip pain (Right)  Obstructive Sleep Apnea  Abnormal nerve conduction studies (severe bilateral lower extremity polyneuropathy)   Diagnosed by EMG/PNCV done on 04/25/2014 by Dr. Michaelle Copas [facility] neurology).   Polyneuropathy  Opioid-induced constipation (OIC)  Long Term Current Use of Opiate Analgesic  Long Term Prescription Opiate Use  Opiate use (60 MME/Day)   Oxycodone IR 10 mg every 6 hours (40 mg/day)   Opiate Dependence (Hcc)  Encounter for Therapeutic Drug Level Monitoring  Paraparesis (Hcc)  Cerumen Impaction  Prostate Cancer Screening  Anxiety and Depression  Allergic Rhinitis  History of Vasovagal response to spinal injections   In response to nerve block doing radiofrequency ablation and spinal injections. Usually pretreated with 0.2 mg of glycopyrrolate (Robinul) IV   Bph (Benign Prostatic Hyperplasia)  Right Ear Pain  Diabetes (Hcc)  Contrast dye allergy  Class I Morbid obesity (HCC) (68% higher incidence of chronic low back pain)  Essential Hypertension  Gerd (Gastroesophageal Reflux Disease)  Hyperlipidemia  Chronic Kidney Disease (Ckd)   Plan of Care  Pharmacotherapy (Medications Ordered): Meds ordered this encounter   Medications  . Oxycodone HCl 10 MG TABS    Sig: Take 1 tablet (10 mg total) by mouth every 6 (six) hours as needed.    Dispense:  120 tablet    Refill:  0    Do not place this medication, or any other prescription from our practice, on "Automatic Refill". Patient may have prescription filled one day early if pharmacy is closed on scheduled refill date. Do not fill until: 08/27/2016 To last until:09/26/2016    Order Specific Question:   Supervising Provider    Answer:   Milinda Pointer [phone]  . Oxycodone HCl 10 MG TABS    Sig: Take 1 tablet (10 mg total) by mouth every 6 (six) hours as needed.    Dispense:  120 tablet    Refill:  0    Do not place this medication, or any other prescription from our practice, on "Automatic Refill". Patient may have prescription filled one day early if pharmacy is closed on scheduled refill date. Do not fill until: 09/26/2016 To last until:10/26/2016    Order Specific Question:   Supervising Provider  AnswerMilinda Pointer [[id number]]  . Oxycodone HCl 10 MG TABS    Sig: Take 1 tablet (10 mg total) by mouth every 6 (six) hours as needed.    Dispense:  120 tablet    Refill:  0    Do not place this medication, or any other prescription from our practice, on "Automatic Refill". Patient may have prescription filled one day early if pharmacy is closed on scheduled refill date. Do not fill until: 10/26/2016 To last until: 11/25/2016    Order Specific Question:   Supervising Provider    Answer:   Milinda Pointer [phone]  . pregabalin (LYRICA) 75 MG capsule    Sig: Take 1 capsule (75 mg total) by mouth every 12 (twelve) hours.    Dispense:  180 capsule    Refill:  0    Do not place this medication, or any other prescription from our practice, on "Automatic Refill". Patient may have prescription filled one day early if pharmacy is closed on scheduled refill date.    Order Specific Question:   Supervising Provider    Answer:   Milinda Pointer  [phone]  . lubiprostone (AMITIZA) 24 MCG capsule    Sig: Take 1 capsule (24 mcg total) by mouth 2 (two) times daily with a meal. Swallow the medication whole. Do not break or chew the medication.    Dispense:  180 capsule    Refill:  0    Do not place this medication, or any other prescription from our practice, on "Automatic Refill". Patient may have prescription filled one day early if pharmacy is closed on scheduled refill date.    Order Specific Question:   Supervising Provider    Answer:   Milinda Pointer [phone]  . Cholecalciferol (VITAMIN D3) 2000 units capsule    Sig: Take 1 capsule (2,000 Units total) by mouth daily.    Dispense:  30 capsule    Refill:  2    Order Specific Question:   Supervising Provider    Answer:   Milinda Pointer [phone]  . magnesium oxide (MAG-OX) 400 (241.3 Mg) MG tablet    Sig: Take 1 tablet (400 mg total) by mouth 2 (two) times daily.    Dispense:  180 tablet    Refill:  0    Order Specific Question:   Supervising Provider    Answer:   Milinda Pointer [phone]   New Prescriptions   No medications on file   Medications administered today: Mr. Henney had no medications administered during this visit. Lab-work, procedure(s), and/or referral(s): Orders Placed This Encounter  Procedures  . ToxASSURE Select 13 (MW), Urine   Imaging and/or referral(s): None  Interventional therapies: Planned, scheduled, and/or pending:   Not at this time.   Considering:   Palliative bilateral lumbar facet RFA (Right done on 02/17/16 & 04/30/15) Diagnostic Left GONB Possible Left GON RFA Palliative Left CESI   Palliative PRN treatment(s):   Diagnostic Left GONB   Provider-requested follow-up: Return in about 3 months (around 11/13/2016) for MedMgmt.  Future Appointments Date Time Provider [place]  09/07/2016 8:45 AM [name], MD [facility] None  09/07/2016 10:00 AM Kem Parkinson, RPH [facility] None  09/15/2016 10:15 AM  Milinda Pointer, MD [facility] None  11/12/2016 12:45 PM Vevelyn Francois, NP [facility] None   Primary Care Physician: [name], MD Location: [facility]patient Pain Management Facility Note by: Vevelyn Francois NP Date: 08/13/2016; Time: 1:02 PM  Pain Score Disclaimer: We use the  NRS-11 scale. This is a self-reported, subjective measurement of pain severity with only modest accuracy. It is used primarily to identify changes within a particular patient. It must be understood that outpatient pain scales are significantly less accurate that those used for research, where they can be applied under ideal controlled circumstances with minimal exposure to variables. In reality, the score is likely to be a combination of pain intensity and pain affect, where pain affect describes the degree of emotional arousal or changes in action readiness caused by the sensory experience of pain. Factors such as social and work situation, setting, emotional state, anxiety levels, expectation, and prior pain experience may influence pain perception and show large inter-individual differences that may also be affected by time variables.  Patient instructions provided during this appointment: Patient Instructions  You have been give 3 scripts for oxycodone and 1 for Lyrica.  ____________________________________________________________________________________________  Medication Rules  Applies to: All patients receiving prescriptions (written or electronic).  Pharmacy of record: Pharmacy where electronic prescriptions will be sent. If written prescriptions are taken to a different pharmacy, please inform the nursing staff. The pharmacy listed in the electronic medical record should be the one where you would like electronic prescriptions to be sent.  Prescription refills: Only during scheduled appointments. Applies to both, written and electronic prescriptions.  NOTE: The following applies primarily to  controlled substances (Opioid* Pain Medications).   Patient's responsibilities: 1. Pain Pills: Bring all pain pills to every appointment (except for procedure appointments). 2. Pill Bottles: Bring pills in original pharmacy bottle. Always bring newest bottle. Bring bottle, even if empty. 3. Medication refills: You are responsible for knowing and keeping track of what medications you need refilled. The day before your appointment, write a list of all prescriptions that need to be refilled. Bring that list to your appointment and give it to the admitting nurse. Prescriptions will be written only during appointments. If you forget a medication, it will not be "Called in", "Faxed", or "electronically sent". You will need to get another appointment to get these prescribed. 4. Prescription Accuracy: You are responsible for carefully inspecting your prescriptions before leaving our office. Have the discharge nurse carefully go over each prescription with you, before taking them home. Make sure that your name is accurately spelled, that your address is correct. Check the name and dose of your medication to make sure it is accurate. Check the number of pills, and the written instructions to make sure they are clear and accurate. Make sure that you are given enough medication to last until your next medication refill appointment. 5. Taking Medication: Take medication as prescribed. Never take more pills than instructed. Never take medication more frequently than prescribed. Taking less pills or less frequently is permitted and encouraged, when it comes to controlled substances (written prescriptions).  6. Inform other Doctors: Always inform, all of your healthcare providers, of all the medications you take. 7. Pain Medication from other Providers: You are not allowed to accept any additional pain medication from any other Doctor or Healthcare provider. There are two exceptions to this rule. (see below) In the event  that you require additional pain medication, you are responsible for notifying us, as stated below. 8. Medication Agreement: You are responsible for carefully reading and following our Medication Agreement. This must be signed before receiving any prescriptions from our practice. Safely store a copy of your signed Agreement. Violations to the Agreement will result in no further prescriptions. (Additional copies of our Medication Agreement are  available upon request.) 9. Laws, Rules, & Regulations: All patients are expected to follow all Federal and [employer], [employer], Rules, [employer]. Ignorance of the Laws does not constitute a valid excuse. The use of any illegal substances is prohibited. 10. Adopted CDC guidelines & recommendations: Target dosing levels will be at or below 60 MME/day. Use of benzodiazepines** is not recommended.  Exceptions: There are only two exceptions to the rule of not receiving pain medications from other Healthcare Providers. 1. Exception #1 (Emergencies): In the event of an emergency (i.e.: accident requiring emergency care), you are allowed to receive additional pain medication. However, you are responsible for: As soon as you are able, call our office [phone], at any time of the day or night, and leave a message stating your name, the date and nature of the emergency, and the name and dose of the medication prescribed. In the event that your call is answered by a member of our staff, make sure to document and save the date, time, and the name of the person that took your information.  2. Exception #2 (Planned Surgery): In the event that you are scheduled by another doctor or dentist to have any type of surgery or procedure, you are allowed (for a period no longer than 30 days), to receive additional pain medication, for the acute post-op pain. However, in this case, you are responsible for picking up a copy of our "Post-op Pain Management for Surgeons" handout, and  giving it to your surgeon or dentist. This document is available at our office, and does not require an appointment to obtain it. Simply go to our office during business hours (Monday-Thursday from 8:00 AM to 4:00 PM) (Friday 8:00 AM to 12:00 Noon) or if you have a scheduled appointment with [place], prior to your surgery, and ask for it by name. In addition, you will need to provide [place] with your name, name of your surgeon, type of surgery, and date of procedure or surgery.  *Opioid medications include: morphine, codeine, oxycodone, oxymorphone, hydrocodone, hydromorphone, meperidine, tramadol, tapentadol, buprenorphine, fentanyl, methadone. **Benzodiazepine medications include: diazepam (Valium), alprazolam (Xanax), clonazepam (Klonopine), lorazepam (Ativan), clorazepate (Tranxene), chlordiazepoxide (Librium), estazolam (Prosom), oxazepam (Serax), temazepam (Restoril), triazolam (Halcion)  ____________________________________________________________________________________________

## 2016-08-19 ENCOUNTER — Other Ambulatory Visit: Payer: Self-pay | Admitting: Family Medicine

## 2016-08-22 LAB — TOXASSURE SELECT 13 (MW), URINE

## 2016-09-01 ENCOUNTER — Other Ambulatory Visit: Payer: Self-pay | Admitting: Family Medicine

## 2016-09-01 NOTE — Telephone Encounter (Signed)
Patient needs an office visit. Please get him scheduled and then I'll consider refilling his medications. Thanks.

## 2016-09-01 NOTE — Telephone Encounter (Signed)
Last OV 03/23/16 last filled Alprazolam 06/26/16 60 0rf Mometasone 05/12/16 17g 1rf

## 2016-09-02 NOTE — Telephone Encounter (Signed)
Patient scheduled for 09/07/16

## 2016-09-02 NOTE — Telephone Encounter (Signed)
Refill given

## 2016-09-02 NOTE — Telephone Encounter (Signed)
faxed

## 2016-09-03 ENCOUNTER — Ambulatory Visit: Payer: Medicare Other | Admitting: Pain Medicine

## 2016-09-07 ENCOUNTER — Ambulatory Visit (INDEPENDENT_AMBULATORY_CARE_PROVIDER_SITE_OTHER): Payer: Medicare Other | Admitting: Pharmacist

## 2016-09-07 ENCOUNTER — Encounter: Payer: Self-pay | Admitting: Pharmacist

## 2016-09-07 ENCOUNTER — Encounter: Payer: Self-pay | Admitting: Family Medicine

## 2016-09-07 ENCOUNTER — Ambulatory Visit (INDEPENDENT_AMBULATORY_CARE_PROVIDER_SITE_OTHER): Payer: Medicare Other | Admitting: Family Medicine

## 2016-09-07 VITALS — BP 140/80 | HR 86 | Temp 98.2°F | Wt 262.6 lb

## 2016-09-07 VITALS — BP 140/80 | HR 86 | Wt 262.0 lb

## 2016-09-07 DIAGNOSIS — F329 Major depressive disorder, single episode, unspecified: Secondary | ICD-10-CM

## 2016-09-07 DIAGNOSIS — J3089 Other allergic rhinitis: Secondary | ICD-10-CM | POA: Diagnosis not present

## 2016-09-07 DIAGNOSIS — F419 Anxiety disorder, unspecified: Secondary | ICD-10-CM

## 2016-09-07 DIAGNOSIS — I10 Essential (primary) hypertension: Secondary | ICD-10-CM | POA: Diagnosis not present

## 2016-09-07 DIAGNOSIS — R3589 Other polyuria: Secondary | ICD-10-CM

## 2016-09-07 DIAGNOSIS — N401 Enlarged prostate with lower urinary tract symptoms: Secondary | ICD-10-CM

## 2016-09-07 DIAGNOSIS — Z794 Long term (current) use of insulin: Secondary | ICD-10-CM | POA: Diagnosis not present

## 2016-09-07 DIAGNOSIS — R351 Nocturia: Secondary | ICD-10-CM | POA: Diagnosis not present

## 2016-09-07 DIAGNOSIS — E1142 Type 2 diabetes mellitus with diabetic polyneuropathy: Secondary | ICD-10-CM

## 2016-09-07 DIAGNOSIS — R358 Other polyuria: Secondary | ICD-10-CM

## 2016-09-07 LAB — POCT URINALYSIS DIPSTICK
BILIRUBIN UA: NEGATIVE
Blood, UA: NEGATIVE
GLUCOSE UA: NEGATIVE
KETONES UA: NEGATIVE
Leukocytes, UA: NEGATIVE
Nitrite, UA: NEGATIVE
Protein, UA: NEGATIVE
Spec Grav, UA: 1.015 (ref 1.010–1.025)
Urobilinogen, UA: 0.2 E.U./dL
pH, UA: 7 (ref 5.0–8.0)

## 2016-09-07 LAB — BASIC METABOLIC PANEL
BUN: 12 mg/dL (ref 6–23)
CALCIUM: 9.1 mg/dL (ref 8.4–10.5)
CHLORIDE: 98 meq/L (ref 96–112)
CO2: 29 meq/L (ref 19–32)
CREATININE: 1.05 mg/dL (ref 0.40–1.50)
GFR: 89.66 mL/min (ref >60.00)
Glucose, Bld: 235 mg/dL — ABNORMAL HIGH (ref 70–99)
Potassium: 4.1 mEq/L (ref 3.5–5.1)
SODIUM: 137 meq/L (ref 135–145)

## 2016-09-07 LAB — POCT GLYCOSYLATED HEMOGLOBIN (HGB A1C): HEMOGLOBIN A1C: 9

## 2016-09-07 MED ORDER — LORATADINE 10 MG PO TABS: 10.0000 mg | ORAL_TABLET | Freq: Every day | ORAL | 11 refills | Status: DC

## 2016-09-07 MED ORDER — INSULIN LISPRO 100 UNIT/ML (KWIKPEN): 10.0000 [IU] | PEN_INJECTOR | Freq: Two times a day (BID) | SUBCUTANEOUS | 3 refills | Status: DC

## 2016-09-07 MED ORDER — MOMETASONE FUROATE 50 MCG/ACT NA SUSP: 2.0000 | Freq: Every day | NASAL | 12 refills | Status: DC

## 2016-09-07 NOTE — Assessment & Plan Note (Addendum)
Nocturia noted. Potentially could be related to his uncontrolled diabetes orders prostate. Given continued symptoms will refer to urology. Check UA as well.

## 2016-09-07 NOTE — Progress Notes (Signed)
S:     Chief Complaint  Patient presents with  . Medication Management    diabetes    Patient arrives in good spirits and ambulating without assistance  Presents for diabetes evaluation, education, and management at the request of Dr. [name]. Patient was referred today on 8/6.  Patient was last seen by Primary Care Provider on today on 8/6.   Patient reports adherence with medications. Of note, patient reports taking Levemir 21 units BID with lunch and dinner. Added to med list. Current diabetes medications include: metformin 1000 mg BID, victoza 1.8 mg daily, humalog 4 units if BG~200 and 7 units if BG~300 (with lunch and dinner).  Current hypertension medications include: carvedilol 25mg  BID, HCTZ 25mg  once daily, and valsartan 320mg  once daily  Patient denies recent hypoglycemic events. Usually experiences symptoms when BG<100.  Patient reported dietary habits: Eats 3 meals/day, sometimes eats 4-5 meals a day. Eats a lot of packaged food ([employer]). Doesn't like to cook, but cooks a few times a week. Breakfast: roast beef hash Lunch: [employer] Dinner: [employer], cabbage, onions, potatoes Snacks: peaches, bananas, watermelon Drinks: water, Fanta strawberry soda (doesn't drink diet soda)  Patient reported exercise habits: difficult to walk and exercise due to history of hip revision   Patient reports nocturia. 3-4x a night. Patient denies neuropathy, but experienced one episode of tingling feeling in feet and fingers. Patient denies visual changes. Patient reports self foot exams.   O:  Physical Exam  Constitutional: He appears well-developed and well-nourished.   Review of Systems  All other systems reviewed and are negative.   Lab Results  Component Value Date   HGBA1C 9.0 09/07/2016   Vitals:   09/07/16 1040  BP: 140/80  Pulse: 86   Home fasting CBG: 160 today; usually sees high 100s in the morning and 200s in the midday before eating lunch  10  year ASCVD risk: Unable to calculate (no lipid panel)  A/P: Diabetes longstanding currently uncontrolled as evidenced by last A1C 9.0 (09/07/2016). Patient denies hypoglycemic events and is able to verbalize appropriate hypoglycemia management plan. Patient reports adherence with medication, though reports also taking Levemir 21 units BID with lunch and dinner. Control is suboptimal due to poor understanding of sliding scale, insulin resistance, dietary indiscretion, sedentary lifestyle Following discussion and approval by Dr. [name], the following medication changes were made:  - Increase Humalog from sliding scale to 10 units BID with meals (lunch and supper) -Continue Levemir 21 units BID  -Continue monitoring BG, and record in BG log. Counseled on checking 2 hours post-prandial.  -Counseled on symptoms and treatment of hypoglycemia -Reviewed basic principles of pharmacokinetics of basal vs prandial insulins -Will call next week to check up on insulin titration -Next A1C anticipated in 12/2016  ASCVD risk greater than 7.5%. Continued Aspirin 325 mg and Continued pravastatin 40 mg.  -Plan to potentially increase to high-intensity statin at future visits, pending lipid panel  Hypertension longstanding currently uncontrolled as evidenced by BP 140/80 today.  Patient reports adherence with medication. Control is suboptimal due to resistant hypertension and dietary indiscretion. -Last seen by Dr. Biagio Quint today. Continue all current HTN medications -Counseled on improving diet and reducing sodium  Patient was seen with Dr. [name] today in clinic and medication changes were discussed and approved prior to initiation  Written patient instructions provided.  Total time in face to face counseling 60 minutes.   Follow up in Pharmacist Clinic Visit by telephone in 1 week, 4 weeks in clinic.  Next PCP visit with Dr. [name] on 11/8.  Patient seen with Shelle Iron, PharmD Candidate.  Carlean Jews, Pharm.D. PGY2 Ambulatory Care Pharmacy Resident Phone: [phone]

## 2016-09-07 NOTE — Assessment & Plan Note (Signed)
Diabetes longstanding currently uncontrolled as evidenced by last A1C 9.0 (09/07/2016). Patient denies hypoglycemic events and is able to verbalize appropriate hypoglycemia management plan. Patient reports adherence with medication, though reports also taking Levemir 21 units BID with lunch and dinner. Control is suboptimal due to poor understanding of sliding scale, insulin resistance, dietary indiscretion, sedentary lifestyle Following discussion and approval by Dr. [name], the following medication changes were made:  - Increase Humalog from sliding scale to 10 units BID with meals (lunch and supper) -Continue Levemir 21 units BID  -Continue monitoring BG, and record in BG log. Counseled on checking 2 hours post-prandial.  -Counseled on symptoms and treatment of hypoglycemia -Reviewed basic principles of pharmacokinetics of basal vs prandial insulins -Will call next week to check up on insulin titration -Next A1C anticipated in 12/2016

## 2016-09-07 NOTE — Assessment & Plan Note (Signed)
Stable.  Continue current medications.

## 2016-09-07 NOTE — Assessment & Plan Note (Signed)
Normal foot exam today. Continue to monitor. Discussed foot precautions.

## 2016-09-07 NOTE — Progress Notes (Signed)
I have reviewed the above note and agree.  Romario Tith, M.D.  

## 2016-09-07 NOTE — Progress Notes (Signed)
[name], MD Phone: [phone]  Alan Schmidt is a 71 y.o. male who presents today for follow-up.  DIABETES Disease Monitoring: Blood Sugar ranges-160 this morning though reports it's been off the charts at times Polyuria/phagia/dipsia- some polyuria     ophthalmology-due for an appointment Medications: Compliance- taking Humalog though he is unable to tell me his exact sliding scale, taking metformin and Victoza Hypoglycemic symptoms- no  HYPERTENSION  Disease Monitoring  Home BP Monitoring not checking Chest pain- no    Dyspnea- no Medications  Compliance-  taking carvedilol, HCTZ, valsartan.  Edema- no  Anxiety/depression: Patient notes no symptoms. He is taking Prozac and Xanax. Taking Xanax twice daily. No SI or HI. Does note his nephew was killed when his head by a car and that had been difficult for his family.  Allergic rhinitis: Generic Nasonex hasn't been very beneficial. He haspostnasal drip and nasal congestion. He is not taking any allergy pills.  BPH: Taking Flomax twice daily. Notes nocturia 4-5 times a night. Daytime symptoms depending on how much he has to drink. Does strain some. No starting and stopping. Never seen urology.   PMH: Former smoker   ROS see history of present illness  Objective  Physical Exam Vitals:   09/07/16 0846  BP: 140/80  Pulse: 86  Temp: 98.2 F (36.8 C)    BP Readings from Last 3 Encounters:  09/07/16 140/80  08/13/16 115/60  05/14/16 (!) 145/96   Wt Readings from Last 3 Encounters:  09/07/16 262 lb 9.6 oz (119.1 kg)  08/13/16 250 lb (113.4 kg)  05/14/16 248 lb (112.5 kg)    Physical Exam  Constitutional: No distress.  Cardiovascular: Normal rate, regular rhythm and normal heart sounds.   Pulmonary/Chest: Effort normal and breath sounds normal.  Musculoskeletal: He exhibits no edema.  Neurological: He is alert. Gait normal.  Skin: Skin is warm and dry. He is not diaphoretic.   Diabetic Foot Exam -  Simple   Simple Foot Form Diabetic Foot exam was performed with the following findings:  Yes 09/07/2016  9:14 AM  Visual Inspection No deformities, no ulcerations, no other skin breakdown bilaterally:  Yes Sensation Testing Intact to touch and monofilament testing bilaterally:  Yes Pulse Check Posterior Tibialis and Dorsalis pulse intact bilaterally:  Yes Comments      Assessment/Plan: Please see individual problem list.  Essential hypertension Labs today. At goal for age. Continue current medications.  Allergic rhinitis Try brand name Nasonex and Claritin.  Diabetes ([place]) A1c uncontrolled. He's seeing our pharmacist today.  Diabetic peripheral neuropathy ([facility]) Normal foot exam today. Continue to monitor. Discussed foot precautions.  BPH (benign prostatic hyperplasia) Nocturia noted. Potentially could be related to his uncontrolled diabetes orders prostate. Given continued symptoms will refer to urology. Check UA as well.  Anxiety and depression Stable. Continue current medications.   Orders Placed This Encounter  Procedures  . Basic Metabolic Panel (BMET)  . Ambulatory referral to Ophthalmology    Referral Priority:   Routine    Referral Type:   Consultation    Referral Reason:   Specialty Services Required    Requested Specialty:   Ophthalmology    Number of Visits Requested:   1  . Ambulatory referral to Urology    Referral Priority:   Routine    Referral Type:   Consultation    Referral Reason:   Specialty Services Required    Requested Specialty:   Urology    Number of Visits Requested:   1  .  POCT HgB A1C  . POCT Urinalysis Dipstick    Meds ordered this encounter  Medications  . loratadine (CLARITIN) 10 MG tablet    Sig: Take 1 tablet (10 mg total) by mouth daily.    Dispense:  30 tablet    Refill:  11  . mometasone (NASONEX) 50 MCG/ACT nasal spray    Sig: Place 2 sprays into the nose daily.    Dispense:  17 g    Refill:  [place],  MD Morris

## 2016-09-07 NOTE — Assessment & Plan Note (Addendum)
Labs today. At goal for age. Continue current medications.

## 2016-09-07 NOTE — Addendum Note (Signed)
Addended by: Arby Barrette on: 09/07/2016 02:44 PM   Modules accepted: Orders

## 2016-09-07 NOTE — Assessment & Plan Note (Signed)
Try brand name Nasonex and Claritin.

## 2016-09-07 NOTE — Assessment & Plan Note (Signed)
  Hypertension longstanding currently uncontrolled as evidenced by BP 140/80 today.  Patient reports adherence with medication. Control is suboptimal due to resistant hypertension and dietary indiscretion. -Last seen by Dr. Biagio Quint today. Continue all current HTN medications -Counseled on improving diet and reducing sodium

## 2016-09-07 NOTE — Patient Instructions (Signed)
Nice to see you. Please start monitoring your blood pressure several times a week. Your goal is 140/90 or less. Please try the Claritin and Nasonex for your allergies. If this is not beneficial please let us know.  We'll refer you to urology and ophthalmology.

## 2016-09-07 NOTE — Patient Instructions (Signed)
Thanks for coming to see Alan Schmidt today! Here are the medication changes we discussed today:  1. Increase your Humalog from sliding scale to 10 units twice daily with your meals.   2. Continue your Levemir 21 units twice daily, with no regards to meals. Make sure you inject yourself at the same time every day.  3. Please monitor your blood sugar and write it down on the log we gave you.  We will call in 1 week to talk about your blood sugar and change insulin if necessary.  My phone number is [phone]. Call me if you have any problems.

## 2016-09-07 NOTE — Assessment & Plan Note (Signed)
A1c uncontrolled. He's seeing our pharmacist today.

## 2016-09-08 ENCOUNTER — Telehealth: Payer: Self-pay | Admitting: Family Medicine

## 2016-09-08 ENCOUNTER — Other Ambulatory Visit: Payer: Self-pay | Admitting: Family Medicine

## 2016-09-08 NOTE — Telephone Encounter (Signed)
lvm to rtc. Referral for ophthalmology. Need to know if he is established with one or if we need to schedule him with a new one.

## 2016-09-10 DIAGNOSIS — B351 Tinea unguium: Secondary | ICD-10-CM | POA: Diagnosis not present

## 2016-09-10 DIAGNOSIS — E119 Type 2 diabetes mellitus without complications: Secondary | ICD-10-CM | POA: Diagnosis not present

## 2016-09-10 DIAGNOSIS — L851 Acquired keratosis [keratoderma] palmaris et plantaris: Secondary | ICD-10-CM | POA: Diagnosis not present

## 2016-09-14 ENCOUNTER — Telehealth: Payer: Self-pay | Admitting: Pharmacist

## 2016-09-14 NOTE — Telephone Encounter (Signed)
Called to assess tolerance of medication changes made last week. No answer. Left HIPAA-compliant VM.   Carlean Jews, Pharm.D. PGY2 Ambulatory Care Pharmacy Resident Phone: [phone]

## 2016-09-15 ENCOUNTER — Ambulatory Visit: Payer: Medicare Other | Admitting: Pain Medicine

## 2016-09-16 ENCOUNTER — Telehealth: Payer: Self-pay

## 2016-09-16 ENCOUNTER — Ambulatory Visit: Payer: Medicare Other | Admitting: Pain Medicine

## 2016-09-16 NOTE — Telephone Encounter (Signed)
-----   Message from [name], MD sent at 09/16/2016 10:43 AM EDT ----- Please see if this improves when he changes positions from his left side. Please see what part of the arm it is. Please see if he has numbness elsewhere. Please see if he has weakness. Please see how long this has been going on. Thanks.

## 2016-09-16 NOTE — Telephone Encounter (Signed)
Patient states it does improve with change of position, he states it is his entire arm from shoulder down to hand. Patient states that's the only place he has noticed numbness. He does not have any weakness Patient states he has noticed the numbness for 1 -2 months.  Ok to leave a message

## 2016-09-16 NOTE — Telephone Encounter (Signed)
Left message to return call 

## 2016-09-16 NOTE — Telephone Encounter (Signed)
Suspect symptoms are related to nerve impingement from the position he is lying in. Since it's been persistent we can evaluate him in the office for this to see if there is any further workup. Thanks.

## 2016-09-16 NOTE — Telephone Encounter (Signed)
Pt requested a call [phone]

## 2016-09-17 NOTE — Telephone Encounter (Signed)
Left message to return call 

## 2016-09-21 ENCOUNTER — Telehealth: Payer: Self-pay | Admitting: Family Medicine

## 2016-09-21 MED ORDER — OLMESARTAN MEDOXOMIL 40 MG PO TABS
40.0000 mg | ORAL_TABLET | Freq: Every day | ORAL | 1 refills | Status: DC
Start: 2016-09-21 — End: 2017-03-13

## 2016-09-21 NOTE — Telephone Encounter (Signed)
Pt called about medication valsartan (DIOVAN) 320 MG tablet pt received a letter stating that the medication causes cancer medication is being recalled pt was told to call his pcp. Please advise?  Call pt @ [phone]. Thank you!

## 2016-09-21 NOTE — Telephone Encounter (Signed)
Left message to notify

## 2016-09-21 NOTE — Telephone Encounter (Signed)
Left message to return call 

## 2016-09-21 NOTE — Telephone Encounter (Signed)
Please advise 

## 2016-09-21 NOTE — Telephone Encounter (Signed)
Patient notified, scheduled patient for annual this Friday per patient request

## 2016-09-21 NOTE — Telephone Encounter (Signed)
Patient states pharmacy did not have a different manufacturer and was advised to switch to a different medication

## 2016-09-21 NOTE — Telephone Encounter (Signed)
The patient should contact his pharmacy to see if they have valsartan that is made by a manufacturer that is not part of the recall. If they do not carry a different manufacturer of the valsartan he can contact us again in week and change the medication.

## 2016-09-21 NOTE — Telephone Encounter (Signed)
Olmesartan sent to pharmacy.

## 2016-09-25 ENCOUNTER — Encounter: Payer: Self-pay | Admitting: Family Medicine

## 2016-09-25 ENCOUNTER — Telehealth: Payer: Self-pay | Admitting: Family Medicine

## 2016-09-25 ENCOUNTER — Ambulatory Visit (INDEPENDENT_AMBULATORY_CARE_PROVIDER_SITE_OTHER): Payer: Medicare Other | Admitting: Family Medicine

## 2016-09-25 VITALS — BP 160/92 | HR 75 | Temp 98.2°F | Ht 72.0 in | Wt 261.0 lb

## 2016-09-25 DIAGNOSIS — Z0001 Encounter for general adult medical examination with abnormal findings: Secondary | ICD-10-CM

## 2016-09-25 DIAGNOSIS — E785 Hyperlipidemia, unspecified: Secondary | ICD-10-CM | POA: Diagnosis not present

## 2016-09-25 LAB — LIPID PANEL
CHOLESTEROL: 122 mg/dL (ref 0–200)
HDL: 49.8 mg/dL (ref >39.00)
LDL CALC: 59 mg/dL (ref 0–99)
NonHDL: 72.54
TRIGLYCERIDES: 70 mg/dL (ref 0.0–149.0)
Total CHOL/HDL Ratio: 2
VLDL: 14 mg/dL (ref 0.0–40.0)

## 2016-09-25 LAB — HEPATIC FUNCTION PANEL
ALT: 27 U/L (ref 0–53)
AST: 25 U/L (ref 0–37)
Albumin: 3.9 g/dL (ref 3.5–5.2)
Alkaline Phosphatase: 67 U/L (ref 39–117)
Bilirubin, Direct: 0.1 mg/dL (ref 0.0–0.3)
TOTAL PROTEIN: 7.3 g/dL (ref 6.0–8.3)
Total Bilirubin: 0.3 mg/dL (ref 0.2–1.2)

## 2016-09-25 NOTE — Progress Notes (Signed)
[name], MD Phone: [phone]  Alan Schmidt is a 71 y.o. male who presents today for physical exam.  Not doing much exercise due to chronic pain. Chronic pain is managed by pain management. Does have narcotics. Had a little bit of right hip pain today. Took a pain pill and this helped. Diet has been good per his report. Blood sugars running around 135. No history of colonoscopy. He is hesitant to do a colonoscopy. Last PSA normal. Please states he has had hepatitis C testing previously and does not want to be rechecked. He defers the flu shot 2 next month. He asks about the shingles vaccine as well and I advised that he should check with the pharmacy as it is on back order here. Pneumonia vaccines completed. Tetanus vaccination up-to-date. Former smoker greater than 40 years ago. No alcohol or illicit drug use. He reports he has not picked up the new blood pressure medication as his valsartan was recalled. A little over a week ago he reports he woke up with a pain in his right groin and leg. Notes it was difficult to urinate with this. No hematuria. Notes he does have a history of kidney stones. He started drinking cranberry juice and notes this has resolved. He wonders if it's related to his prostate. He has not had any recurrence of these symptoms.  Active Ambulatory Problems    Diagnosis Date Noted  . Paraparesis ([place]) 05/02/2014  . Chronic low back pain (Location of Primary Source of Pain) (Bilateral) (R>L) 12/05/2014  . Lumbar facet syndrome (Bilateral) (R>L) 12/05/2014  . Lumbar spondylosis 12/05/2014  . Diabetic peripheral neuropathy ([place]) 12/05/2014  . Long term current use of opiate analgesic 12/05/2014  . Long term prescription opiate use 12/05/2014  . Opiate use (60 MME/Day) 12/05/2014  . Opiate dependence ([place]) 12/05/2014  . Encounter for therapeutic drug level monitoring 12/05/2014  . Chronic neck pain (midline over the C7 spinous processes) (L>R) 12/05/2014    . Neurogenic pain 12/05/2014  . Neuropathic pain 12/05/2014  . Contrast dye allergy 12/05/2014  . Chronic lower extremity pain (Location of Secondary source of pain) (Bilateral) (R>L) 12/05/2014  . 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) (Right L5 dermatome) 12/05/2014  . 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 Chronic hip pain (Right) 12/05/2014  . Class I Morbid obesity ([place]) (68% higher incidence of chronic low back pain) 12/05/2014  . Essential hypertension 12/05/2014  . GERD (gastroesophageal reflux disease) 12/05/2014  . Obstructive sleep apnea 12/05/2014  . Hyperlipidemia 12/05/2014  . Chronic kidney disease (CKD) 12/05/2014  . Abnormal nerve conduction studies (severe bilateral lower extremity polyneuropathy) 12/05/2014  . Chronic shoulder pain (Bilateral) (R>L) 03/06/2015  . Occipital neuralgia (Left) 03/06/2015  . Cervicogenic headache (Left) 03/06/2015  . Diabetes ([place]) 03/19/2015  . BPH (benign prostatic hyperplasia) 04/17/2015  . Right ear pain 04/17/2015  . History of Vasovagal response to spinal injections 04/30/2015  . Allergic rhinitis 05/22/2015  . Anxiety and depression 07/17/2015  . Disturbance of skin sensation 11/13/2015  . Prostate cancer screening 11/21/2015  . Chronic shoulder radicular pain (Left) 12/18/2015  . Chronic cervical radicular pain (Left) 12/18/2015  . Chronic pain syndrome 02/13/2016  . Cerumen impaction 03/23/2016  . Opioid-induced constipation (OIC) 05/14/2016  . Encounter for general adult medical examination with abnormal findings 09/25/2016   Resolved Ambulatory Problems    Diagnosis Date Noted  . Polyneuropathy 05/02/2014  . Insulin dependent diabetes mellitus ([place]) 12/05/2014  . Generalized anxiety disorder 12/05/2014  .  Depression 12/05/2014   Past Medical History:  Diagnosis Date  . Anxiety   . Chronic hip pain (Right) 12/05/2014  . Chronic lumbar pain   . Depression   . Hyperlipidemia   .  Hypertension   . Kidney stones   . Migraines     Family History  Problem Relation Age of Onset  . Cancer Mother   . Heart disease Father   . Stroke Father     Social History   Social History  . Marital status: Unknown    Spouse name: N/A  . Number of children: N/A  . Years of education: N/A   Occupational History  . Not on file.   Social History Main Topics  . Smoking status: Former Research scientist (life sciences)  . Smokeless tobacco: Never Used  . Alcohol use No  . Drug use: No  . Sexual activity: Not on file   Other Topics Concern  . Not on file   Social History Narrative  . No narrative on file    ROS  General:  Negative for nexplained weight loss, fever Skin: Negative for new or changing mole, sore that won't heal HEENT: Positive for trouble seeing, notes he is less able to read up close, Negative for trouble hearing, ringing in ears, mouth sores, hoarseness, change in voice, dysphagia. CV:  Negative for chest pain, dyspnea, edema, palpitations Resp: Negative for cough, dyspnea, hemoptysis GI: Negative for nausea, vomiting, diarrhea, constipation, abdominal pain, melena, hematochezia. GU: Negative for dysuria, incontinence, urinary hesitance, hematuria, vaginal or penile discharge, polyuria, sexual difficulty, lumps in testicle or breasts MSK: Negative for muscle cramps or aches, positive for joint pain or swelling Neuro: Negative for headaches, weakness, numbness, dizziness, passing out/fainting Psych: Negative for depression, anxiety, memory problems  Objective  Physical Exam Vitals:   09/25/16 0920  BP: (!) 160/92  Pulse: 75  Temp: 98.2 F (36.8 C)  SpO2: 95%    BP Readings from Last 3 Encounters:  09/25/16 (!) 160/92  09/07/16 140/80  09/07/16 140/80   Wt Readings from Last 3 Encounters:  09/25/16 261 lb (118.4 kg)  09/07/16 262 lb (118.8 kg)  09/07/16 262 lb 9.6 oz (119.1 kg)    Physical Exam  Constitutional: No distress.  HENT:  Head: Normocephalic and  atraumatic.  Mouth/Throat: Oropharynx is clear and moist. No oropharyngeal exudate.  Eyes: Pupils are equal, round, and reactive to light. Conjunctivae are normal.  Cardiovascular: Normal rate, regular rhythm and normal heart sounds.   Pulmonary/Chest: Effort normal and breath sounds normal.  Abdominal: Soft. Bowel sounds are normal. He exhibits no distension. There is no tenderness. There is no rebound and no guarding.  Genitourinary: Rectum normal and prostate normal. Rectal exam shows guaiac negative stool.  Musculoskeletal: He exhibits no edema.  Limited internal range of motion left hip, good external range of motion left hip, discomfort on internal range of motion right hip, no discomfort on external range of motion right hip  Neurological: He is alert. Gait normal.  Skin: Skin is warm and dry. He is not diaphoretic.  Psychiatric: Mood and affect normal.     Assessment/Plan:   Encounter for general adult medical examination with abnormal findings Physical exam completed. Discussed monitoring diet and trying to get physical activity. His chronic pain does limit his physical activity and he will follow-up with pain management as he reports he is overdue for an injection in his back. Cologuard to be completed. I advised he needs to check with his insurance company to make  sure this is covered. Prostate exam normal. Guaiac negative. We'll try to get him in sooner to see an ophthalmologist. He'll return here or go to the pharmacy for his flu shot next month at his request. Blood pressure likely elevated as he has not been taking his blood pressure medication. He will pick up the new medication today to start. Discomfort he had previously potentially could've been related to a kidney stone. He has no symptoms currently. Benign prostate exam. He'll monitor for recurrence. Lab work as outlined below.   Orders Placed This Encounter  Procedures  . Lipid panel  . Hepatic function panel    No  orders of the defined types were placed in this encounter.    [name], MD [place]

## 2016-09-25 NOTE — Telephone Encounter (Signed)
Left pt message asking to call Allison back directly at [phone] to schedule AWV. Thanks! °  °*NOTE* No hx of AWV °

## 2016-09-25 NOTE — Patient Instructions (Signed)
Nice to see you. Please pick up your blood pressure medication today and start it. We'll try to get you in to see an eye doctor sooner. We will check your cholesterol today.

## 2016-09-25 NOTE — Assessment & Plan Note (Signed)
Physical exam completed. Discussed monitoring diet and trying to get physical activity. His chronic pain does limit his physical activity and he will follow-up with pain management as he reports he is overdue for an injection in his back. Cologuard to be completed. I advised he needs to check with his insurance company to make sure this is covered. Prostate exam normal. Guaiac negative. We'll try to get him in sooner to see an ophthalmologist. He'll return here or go to the pharmacy for his flu shot next month at his request. Blood pressure likely elevated as he has not been taking his blood pressure medication. He will pick up the new medication today to start. Discomfort he had previously potentially could've been related to a kidney stone. He has no symptoms currently. Benign prostate exam. He'll monitor for recurrence. Lab work as outlined below.

## 2016-09-29 ENCOUNTER — Telehealth: Payer: Self-pay

## 2016-09-29 NOTE — Telephone Encounter (Signed)
Called patient to verify he did start the new bp medication and discontinued valsartan due to recall

## 2016-09-30 NOTE — Telephone Encounter (Signed)
Patient states he did start the new medication.  Patient received a call from John Sevier and is not sure why he needs the wellness visit, please explain to patient.

## 2016-10-02 ENCOUNTER — Ambulatory Visit: Payer: Self-pay | Admitting: Urology

## 2016-10-02 NOTE — Telephone Encounter (Signed)
Left pt message asking to call Allison back directly at [phone] to schedule AWV. Thanks! °  °*NOTE* No hx of AWV °

## 2016-10-02 NOTE — Telephone Encounter (Signed)
Please see AWV phone note

## 2016-10-06 ENCOUNTER — Telehealth: Payer: Self-pay

## 2016-10-06 NOTE — Telephone Encounter (Signed)
Called pt about knee pain . Instructed that he could come in for eval of the knee if he wanted to , or call if needed. Pt wants to wait.

## 2016-10-06 NOTE — Telephone Encounter (Signed)
Patient is having a lot of pain in his right knee. Is there anything you can do before his RFA on 11/24/16?  I told the patient Dr. Dossie Arbour is out of town this week and it will be next week before this can be addressed.

## 2016-10-09 ENCOUNTER — Other Ambulatory Visit: Payer: Self-pay | Admitting: Family Medicine

## 2016-10-09 NOTE — Telephone Encounter (Signed)
Last Refill was 09/02/2016, #20 with no refills, last OV was 09/25/16. Please advise, thanks

## 2016-10-12 ENCOUNTER — Encounter: Payer: Self-pay | Admitting: Pharmacist

## 2016-10-12 ENCOUNTER — Ambulatory Visit (INDEPENDENT_AMBULATORY_CARE_PROVIDER_SITE_OTHER): Payer: Medicare Other | Admitting: Pharmacist

## 2016-10-12 DIAGNOSIS — E1142 Type 2 diabetes mellitus with diabetic polyneuropathy: Secondary | ICD-10-CM

## 2016-10-12 DIAGNOSIS — Z794 Long term (current) use of insulin: Secondary | ICD-10-CM

## 2016-10-12 MED ORDER — INSULIN DEGLUDEC 100 UNIT/ML ~~LOC~~ SOPN: 40.0000 [IU] | PEN_INJECTOR | Freq: Every day | SUBCUTANEOUS | 3 refills | Status: DC

## 2016-10-12 MED ORDER — INSULIN LISPRO 100 UNIT/ML (KWIKPEN): 10.0000 [IU] | PEN_INJECTOR | Freq: Three times a day (TID) | SUBCUTANEOUS | 3 refills | Status: DC

## 2016-10-12 NOTE — Assessment & Plan Note (Signed)
Hypertension longstanding currently uncontrolled.  Patient reports adherence with medication regularly however he did not take pills prior to this visit. Control is suboptimal due to medication nonadherence today, dietary indiscretion, obesity. -Continue current meds for now -Recheck at next visit after he has taken his meds.

## 2016-10-12 NOTE — Patient Instructions (Addendum)
Thanks for coming to see [place] today! You are making BIG improvements. We still have some work to do.   1. Add a Humalog shot with breakfast. Your new schedule will be humalog 10 units with breakfast, lunch, and supper.   2. Continue Levemir 21 units twice a day until gone. Then switch over to [place] 40 units once a day. This was sent to [facility].   3. Try to avoid fruit juices - they are working against you!   We will call you in 1 week. Make a follow up appointment with me in 2-3 weeks.

## 2016-10-12 NOTE — Telephone Encounter (Signed)
Patient has requested a medication refill for Alprazolam

## 2016-10-12 NOTE — Progress Notes (Signed)
I have reviewed the above note and agree.  Raunel Dimartino, M.D.  

## 2016-10-12 NOTE — Telephone Encounter (Signed)
Please fax

## 2016-10-12 NOTE — Assessment & Plan Note (Signed)
Diabetes longstanding, currently uncontrolled per A1C and CBG log however improved + improved adherence to BG checks. Patient denies hypoglycemic events and is able to verbalize appropriate hypoglycemia management plan. Patient reports adherence with medication. Control is suboptimal due to unoptimized insulin dosing, dietary indiscretion, sedentary lifestyle. Following discussion and approval by Dr. [name], the following medication changes were made:  -Continue levemir 21 units BID until gone, then change to [place] 40 units once daily. This is preferred on insurance plan and will reduce shots/day -Start humalog 10 units with breakfast, continue humalog 10 units with lunch and supper -Continue victoza 1.8 mg daily with plan to switch to [place] next visit to reduce shots/day -Continue metformin 1000 mg BID -Next A1C anticipated November, 6, 2018 or later.    ASCVD risk greater than 7.5%. Continued Aspirin 325 mg and Continued pravastatin 40 mg.  -Consider switch to high intensity statin at next visit. Rosuvastatin and atorvastatin both preferred on his insurance plan.

## 2016-10-12 NOTE — Progress Notes (Signed)
S:     Chief Complaint  Patient presents with  . Medication Management    Diabetes    Patient arrives in good spirits, ambulating without assistance.  Presents for diabetes evaluation, education, and management at the request of Dr. [name]. Patient was referred on 09/07/16.  Patient was last seen by Primary Care Provider on 09/25/2016 for a physical.   Reports that he has been doing better and has been giving himself 21-23 levemir BID. Inquires if he can take humalog with breakfast if it is bigger than lunch. Has not taken his pills this AM. Checking his BG very regularly.   Patient reports adherence with medications.  Current diabetes medications include: levemir 21-23 units twice a day, humalog 10 units twice daily with lunch and supper, metformin 1000 mg BID, victoza 1.8 mg daily Current hypertension medications include: olmesartan 40 mg daily, HCTZ 25 mg daily, carvedilol 25 mg BID  Patient denies hypoglycemic events.  Patient reported dietary habits: Eats 4-5 meals/day Breakfast: Sausage biscuit, 3 eggs Lunch: Salad w/ bleu cheese dressing and frozen [place] dinner as well Dinner: Chicken sandwich from [employer] and a frosty  Snacks: Bananas, fruits, loaf cake  Drinks: Some water, fruit juice, 1 bottle of soda daily   Patient reported exercise habits:none at present, limited by back pain, surgical hip repair   Patient reports nocturia. 3 to 4 times  Patient denies neuropathy. Patient denies visual changes.  Patient reports self foot exams. No issues, saw podiatrist 2 weeks ago   O:  Physical Exam  Constitutional: He appears well-developed and well-nourished.    Review of Systems  All other systems reviewed and are negative.    Lab Results  Component Value Date   HGBA1C 9.0 09/07/2016   Vitals:   10/12/16 0912  BP: (!) 153/92  Pulse: 70    Home fasting CBG: 133 today, ranges 110s-170s, excursions to 197, 205 2 hour post-prandial/random CBG: high  100s-mid-200s  Lipid Panel     Component Value Date/Time   CHOL 122 09/25/2016 0948   TRIG 70.0 09/25/2016 0948   HDL 49.80 09/25/2016 0948   CHOLHDL 2 09/25/2016 0948   VLDL 14.0 09/25/2016 0948   LDLCALC 59 09/25/2016 0948   ASCVD risk score: 39.1% (asuming LDL of 130 - minimum for calculator)  A/P: Diabetes longstanding, currently uncontrolled per A1C and CBG log however improved + improved adherence to BG checks. Patient denies hypoglycemic events and is able to verbalize appropriate hypoglycemia management plan. Patient reports adherence with medication. Control is suboptimal due to unoptimized insulin dosing, dietary indiscretion, sedentary lifestyle. Following discussion and approval by Dr. [name], the following medication changes were made:  -Continue levemir 21 units BID until gone, then change to [place] 40 units once daily. This is preferred on insurance plan and will reduce shots/day -Start humalog 10 units with breakfast, continue humalog 10 units with lunch and supper -Continue victoza 1.8 mg daily with plan to switch to [place] next visit to reduce shots/day -Continue metformin 1000 mg BID -Next A1C anticipated November, 6, 2018 or later.    ASCVD risk greater than 7.5%. Continued Aspirin 325 mg and Continued pravastatin 40 mg.  -Consider switch to high intensity statin at next visit. Rosuvastatin and atorvastatin both preferred on his insurance plan.   Hypertension longstanding currently uncontrolled.  Patient reports adherence with medication regularly however he did not take pills prior to this visit. Control is suboptimal due to medication nonadherence today, dietary indiscretion, obesity. -Continue current meds for  now -Recheck at next visit after he has taken his meds.    Patient was seen with Dr. [name] today in clinic and medication changes were discussed and approved prior to initiation.  Written patient instructions provided.  Total time in face to  face counseling 45 minutes.   Follow up in Pharmacist Clinic Visit 2-3 weeks.   Patient seen with Barkley Boards, PharmD Candidate  Carlean Jews, Pharm.D. PGY2 Ambulatory Care Pharmacy Resident Phone: [phone]

## 2016-10-13 NOTE — Telephone Encounter (Signed)
faxed

## 2016-10-19 ENCOUNTER — Telehealth: Payer: Self-pay | Admitting: Pharmacist

## 2016-10-19 NOTE — Telephone Encounter (Signed)
Called patient to check on tolerance of medication changes. Patient returned phone call this evening. Patient states that he was in a lot of pain at last visit and doesn't remember all of the changes made. Plan re-iterated to patient.   Reports his CBGs as follows: 157 2 hrs after dinner, 130 fasting this morning, denies hypoglycemia  Patient states that he has ~1 full pen of levemir. Verbalized understanding that he is to switch to [place] 40 units once a day once he runs out of levemir.   Patient verbalized understanding of change of humalog to 10 units three times a day with meals.   Will call patient in ~1 week to check on switch and answer any questions.   Carlean Jews, Pharm.D. PGY2 Ambulatory Care Pharmacy Resident Phone: [phone]

## 2016-10-19 NOTE — Progress Notes (Signed)
Called patient to check on him since changes initiated. No answer. Left HIPAA-compliant VM

## 2016-10-23 NOTE — Telephone Encounter (Signed)
Called pt to reinforce insulin regimen changes. Patient states that he was confused and took 10 units of Tresiba last night.  Reinforced plan:  Tresiba 40 units daily, humalog 10 units TID with meals.   Patient verbalized and demonstrated understanding with teach back. Advised pt to discard remaining levemir supply and take remaining 30 units of Tresiba now.   Will follow up with patient in ~1 week to assess BG control and adherence.   Carlean Jews, Pharm.D. PGY2 Ambulatory Care Pharmacy Resident Phone: [phone]

## 2016-10-25 ENCOUNTER — Emergency Department
Admission: EM | Admit: 2016-10-25 | Discharge: 2016-10-25 | Disposition: A | Discharge status: Home / Self Care | Payer: Medicare Other | Attending: Emergency Medicine | Admitting: Emergency Medicine

## 2016-10-25 ENCOUNTER — Encounter: Payer: Self-pay | Admitting: Emergency Medicine

## 2016-10-25 ENCOUNTER — Emergency Department: Payer: Medicare Other

## 2016-10-25 DIAGNOSIS — R1032 Left lower quadrant pain: Secondary | ICD-10-CM | POA: Insufficient documentation

## 2016-10-25 DIAGNOSIS — G8929 Other chronic pain: Secondary | ICD-10-CM | POA: Diagnosis not present

## 2016-10-25 DIAGNOSIS — I129 Hypertensive chronic kidney disease with stage 1 through stage 4 chronic kidney disease, or unspecified chronic kidney disease: Secondary | ICD-10-CM | POA: Insufficient documentation

## 2016-10-25 DIAGNOSIS — Z87891 Personal history of nicotine dependence: Secondary | ICD-10-CM | POA: Diagnosis not present

## 2016-10-25 DIAGNOSIS — Z79899 Other long term (current) drug therapy: Secondary | ICD-10-CM | POA: Insufficient documentation

## 2016-10-25 DIAGNOSIS — Z7982 Long term (current) use of aspirin: Secondary | ICD-10-CM | POA: Insufficient documentation

## 2016-10-25 DIAGNOSIS — N189 Chronic kidney disease, unspecified: Secondary | ICD-10-CM | POA: Insufficient documentation

## 2016-10-25 DIAGNOSIS — R109 Unspecified abdominal pain: Secondary | ICD-10-CM

## 2016-10-25 DIAGNOSIS — E1122 Type 2 diabetes mellitus with diabetic chronic kidney disease: Secondary | ICD-10-CM | POA: Diagnosis not present

## 2016-10-25 DIAGNOSIS — Z794 Long term (current) use of insulin: Secondary | ICD-10-CM | POA: Insufficient documentation

## 2016-10-25 LAB — BASIC METABOLIC PANEL
ANION GAP: 11 (ref 5–15)
BUN: 16 mg/dL (ref 6–20)
CHLORIDE: 94 mmol/L — AB (ref 101–111)
CO2: 24 mmol/L (ref 22–32)
Calcium: 9 mg/dL (ref 8.9–10.3)
Creatinine, Ser: 0.76 mg/dL (ref 0.61–1.24)
GFR calc non Af Amer: >60 mL/min (ref >60)
Glucose, Bld: 199 mg/dL — ABNORMAL HIGH (ref 65–99)
POTASSIUM: 3.9 mmol/L (ref 3.5–5.1)
Sodium: 129 mmol/L — ABNORMAL LOW (ref 135–145)

## 2016-10-25 LAB — CBC
HCT: 41.5 % (ref 40.0–52.0)
Hemoglobin: 13.8 g/dL (ref 13.0–18.0)
MCH: 28.6 pg (ref 26.0–34.0)
MCHC: 33.3 g/dL (ref 32.0–36.0)
MCV: 86 fL (ref 80.0–100.0)
Platelets: 278 10*3/uL (ref 150–440)
RBC: 4.83 MIL/uL (ref 4.40–5.90)
RDW: 15.5 % — ABNORMAL HIGH (ref 11.5–14.5)
WBC: 9.4 10*3/uL (ref 3.8–10.6)

## 2016-10-25 LAB — URINALYSIS, COMPLETE (UACMP) WITH MICROSCOPIC
Bacteria, UA: NONE SEEN
Bilirubin Urine: NEGATIVE
Glucose, UA: 150 mg/dL — AB
Hgb urine dipstick: NEGATIVE
Ketones, ur: NEGATIVE mg/dL
Leukocytes, UA: NEGATIVE
Nitrite: NEGATIVE
Protein, ur: 100 mg/dL — AB
RBC / HPF: NONE SEEN RBC/hpf (ref 0–5)
Specific Gravity, Urine: 1.013 (ref 1.005–1.030)
Squamous Epithelial / HPF: NONE SEEN
pH: 6 (ref 5.0–8.0)

## 2016-10-25 MED ORDER — KETOROLAC TROMETHAMINE 60 MG/2ML IM SOLN
60.0000 mg | Freq: Once | INTRAMUSCULAR | Status: AC
  Administered 2016-10-25: 60 mg via INTRAMUSCULAR
  Filled 2016-10-25: qty 2

## 2016-10-25 MED ORDER — KETOROLAC TROMETHAMINE 30 MG/ML IJ SOLN
INTRAMUSCULAR | Status: AC
  Filled 2016-10-25: qty 1

## 2016-10-25 NOTE — ED Notes (Signed)
Dr Archie Balboa at bedside with pt. Pt reports that the pain in his left flank woke him up this am, pt states that his pain was so severe he couldn't even manage to come to the hospital to be evaluated, pt reports that he took a gram of tylenol without relief and then took an oxycodone, pt states that at this moment he is in the least amount of pain so far today and rates it an 8/10. Pt reports hx of kidney stone in the past, states that the pain has been pulsating in the left flank, pt also mentions that he has been having chronic pain in the rt hip, knee, and ankle and thinks this is related to hip surg he has had in the past, pt states that this pain today is different than that and reminds him of his kidney stone in the past, pt appears to be in no acute distress at this time, pt given the call out and remote to the tv

## 2016-10-25 NOTE — ED Triage Notes (Signed)
Pt c/o left flank pain starting early this morning that woke him up.  No fevers, vomiting or diarrhea. Has had some nausea. VSS. Hx kidney stones, feels similar per pt.  Does not appear in severe pain, sitting reading newspaper when RN entered room.

## 2016-10-25 NOTE — ED Provider Notes (Signed)
[facility] Emergency Department Provider Note  ____________________________________________   I have reviewed the triage vital signs and the nursing notes.   HISTORY  Chief Complaint Flank Pain   History limited by: Not Limited   HPI Alan Schmidt is a 71 y.o. male who presents to the emergency department today because of concerns for left flank pain.patient states that he did have some discomfort in the past couple of days however this morning when he woke up it was worse. He describes as being located in the left flank. It does not radiate into his groin or his chest. This has been accompanied by some nausea. He denies any change in bowel movements. He denies any fevers. No shortness of breath. He states he does have a history of kidney stones and this feels similar to his previous kidney stones. He states he has had to have his kidney stones broken up in the past.   Past Medical History:  Diagnosis Date  . Anxiety   . Chronic hip pain (Right) 12/05/2014  . Chronic lumbar pain   . Depression   . Hyperlipidemia   . Hypertension   . Kidney stones   . Migraines     Patient Active Problem List   Diagnosis Date Noted  . Encounter for general adult medical examination with abnormal findings 09/25/2016  . Opioid-induced constipation (OIC) 05/14/2016  . Cerumen impaction 03/23/2016  . Chronic pain syndrome 02/13/2016  . Chronic shoulder radicular pain (Left) 12/18/2015  . Chronic cervical radicular pain (Left) 12/18/2015  . Prostate cancer screening 11/21/2015  . Disturbance of skin sensation 11/13/2015  . Anxiety and depression 07/17/2015  . Allergic rhinitis 05/22/2015  . History of Vasovagal response to spinal injections 04/30/2015    Class: History of  . BPH (benign prostatic hyperplasia) 04/17/2015  . Right ear pain 04/17/2015  . Diabetes ([place]) 03/19/2015  . Chronic shoulder pain (Bilateral) (R>L) 03/06/2015  . Occipital neuralgia (Left)  03/06/2015  . Cervicogenic headache (Left) 03/06/2015  . Chronic low back pain (Location of Primary Source of Pain) (Bilateral) (R>L) 12/05/2014  . Lumbar facet syndrome (Bilateral) (R>L) 12/05/2014  . Lumbar spondylosis 12/05/2014  . Diabetic peripheral neuropathy ([place]) 12/05/2014  . Long term current use of opiate analgesic 12/05/2014  . Long term prescription opiate use 12/05/2014  . Opiate use (60 MME/Day) 12/05/2014  . Opiate dependence ([place]) 12/05/2014  . Encounter for therapeutic drug level monitoring 12/05/2014  . Chronic neck pain (midline over the C7 spinous processes) (L>R) 12/05/2014  . Neurogenic pain 12/05/2014  . Neuropathic pain 12/05/2014  . Contrast dye allergy 12/05/2014  . Chronic lower extremity pain (Location of Secondary source of pain) (Bilateral) (R>L) 12/05/2014  . 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) (Right L5 dermatome) 12/05/2014  . 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 Chronic hip pain (Right) 12/05/2014  . Class I Morbid obesity ([place]) (68% higher incidence of chronic low back pain) 12/05/2014  . Essential hypertension 12/05/2014  . GERD (gastroesophageal reflux disease) 12/05/2014  . Obstructive sleep apnea 12/05/2014  . Hyperlipidemia 12/05/2014  . Chronic kidney disease (CKD) 12/05/2014  . Abnormal nerve conduction studies (severe bilateral lower extremity polyneuropathy) 12/05/2014  . Paraparesis ([place]) 05/02/2014    Past Surgical History:  Procedure Laterality Date  . right hip surgery     4 surgeries  . TONSILLECTOMY    . TOTAL HIP ARTHROPLASTY      Prior to Admission medications   Medication Sig Start Date End Date  Taking? Authorizing Provider  ACCU-CHEK COMPACT PLUS test strip USE 1 STRIP 7 TIMES DAILY TO TEST 07/05/15   [name], MD  acetaminophen (TYLENOL) 500 MG tablet Take 500 mg by mouth every 6 (six) hours as needed.    [provider]  ALPRAZolam Duanne Moron) 0.5 MG tablet TAKE 1 TABLET BY  MOUTH TWICE A DAY AS NEEDED 10/12/16   [name], MD  aspirin EC 325 MG tablet Take 325 mg by mouth daily.     [provider]  BD PEN NEEDLE NANO U/F 32G X 4 MM MISC USE EVERY DAY 10/10/15   [name], MD  carvedilol (COREG) 25 MG tablet TAKE 1 TABLET (25 MG TOTAL) BY MOUTH 2 (TWO) TIMES DAILY. 07/30/16   [name], MD  Cholecalciferol (VITAMIN D3) 2000 units capsule Take 1 capsule (2,000 Units total) by mouth daily. 08/13/16 11/11/16  Vevelyn Francois, NP  desoximetasone (TOPICORT) 0.25 % cream APPLY CREAM TO AFFECTED AREA TWO TIMES DAILY 02/22/15   [provider]  FLUoxetine (PROZAC) 40 MG capsule TAKE ONE CAPSULE BY MOUTH TWO TIMES DAILY 07/29/16   [name], MD  hydrochlorothiazide (HYDRODIURIL) 25 MG tablet TAKE 1 TABLET (25 MG TOTAL) BY MOUTH DAILY. 07/30/16   [name], MD  hydrOXYzine (ATARAX/VISTARIL) 50 MG tablet TAKE 1 TABLET BY MOUTH FOUR TIMES A DAY AS NEEDED 07/17/15   [name], MD  insulin degludec (TRESIBA FLEXTOUCH) 100 UNIT/ML SOPN FlexTouch Pen Inject 0.4 mLs (40 Units total) into the skin daily at 10 pm. 10/12/16   [name], MD  insulin lispro (HUMALOG KWIKPEN) 100 UNIT/ML KiwkPen Inject 0.1 mLs (10 Units total) into the skin 3 (three) times daily with meals. 10/12/16   [name], MD  loratadine (CLARITIN) 10 MG tablet Take 1 tablet (10 mg total) by mouth daily. 09/07/16   [name], MD  lubiprostone (AMITIZA) 24 MCG capsule Take 1 capsule (24 mcg total) by mouth 2 (two) times daily with a meal. Swallow the medication whole. Do not break or chew the medication. 08/27/16 11/25/16  Vevelyn Francois, NP  magnesium oxide (MAG-OX) 400 (241.3 Mg) MG tablet Take 1 tablet (400 mg total) by mouth 2 (two) times daily. 08/27/16 11/25/16  Vevelyn Francois, NP  metFORMIN (GLUCOPHAGE) 1000 MG tablet TAKE 1 TABLET (1,000 MG TOTAL) BY MOUTH 2 (TWO) TIMES DAILY WITH A MEAL. 07/30/16   [name], MD   mometasone (NASONEX) 50 MCG/ACT nasal spray Place 2 sprays into the nose daily. 09/07/16   [name], MD  naloxone [facility]) 2 MG/2ML injection Inject content of syringe into thigh muscle. Call 911. 05/29/15   Milinda Pointer, MD  olmesartan (BENICAR) 40 MG tablet Take 1 tablet (40 mg total) by mouth daily. 09/21/16   [name], MD  Oxycodone HCl 10 MG TABS Take 1 tablet (10 mg total) by mouth every 6 (six) hours as needed. 09/26/16 10/26/16  Vevelyn Francois, NP  Oxycodone HCl 10 MG TABS Take 1 tablet (10 mg total) by mouth every 6 (six) hours as needed. 10/26/16 11/25/16  Vevelyn Francois, NP  pravastatin (PRAVACHOL) 40 MG tablet TAKE 1 TABLET (40 MG TOTAL) BY MOUTH DAILY. 08/19/16   [name], MD  pregabalin (LYRICA) 75 MG capsule Take 1 capsule (75 mg total) by mouth every 12 (twelve) hours. 08/27/16 11/25/16  Vevelyn Francois, NP  tamsulosin (FLOMAX) 0.4 MG CAPS capsule TAKE 1 CAPSULE (0.4  MG TOTAL) BY MOUTH 2 (TWO) TIMES DAILY. 08/19/16   [name], MD  VICTOZA 18 MG/3ML SOPN INJECT 0.3 MLS (1.8 MG TOTAL) INTO THE SKIN DAILY. 06/18/16   [name], MD    Allergies Contrast media [iodinated diagnostic agents]; Iodine; and Shellfish allergy  Family History  Problem Relation Age of Onset  . Cancer Mother   . Heart disease Father   . Stroke Father     Social History Social History  Substance Use Topics  . Smoking status: Former Research scientist (life sciences)  . Smokeless tobacco: Never Used  . Alcohol use No    Review of Systems Constitutional: No fever/chills Eyes: No visual changes. ENT: No sore throat. Cardiovascular: Denies chest pain. Respiratory: Denies shortness of breath. Gastrointestinal: Positive for left flank pain. Genitourinary: Negative for dysuria. Musculoskeletal: Negative for back pain. Skin: Negative for rash. Neurological: Negative for headaches, focal weakness or numbness.  ____________________________________________   PHYSICAL  EXAM:  VITAL SIGNS: ED Triage Vitals  Enc Vitals Group     BP 10/25/16 1419 (!) 182/95     Pulse Rate 10/25/16 1419 88     Resp 10/25/16 1419 18     Temp 10/25/16 1419 98.4 F (36.9 C)     Temp Source 10/25/16 1419 Oral     SpO2 10/25/16 1419 100 %     Weight 10/25/16 1426 250 lb (113.4 kg)     Height 10/25/16 1426 6\' 1"  (1.854 m)     Head Circumference --      Peak Flow --      Pain Score 10/25/16 1426 8   Constitutional: Alert and oriented. Well appearing and in no distress. Eyes: Conjunctivae are normal.  ENT   Head: Normocephalic and atraumatic.   Nose: No congestion/rhinnorhea.   Mouth/Throat: Mucous membranes are moist.   Neck: No stridor. Hematological/Lymphatic/Immunilogical: No cervical lymphadenopathy. Cardiovascular: Normal rate, regular rhythm.  No murmurs, rubs, or gallops. Respiratory: Normal respiratory effort without tachypnea nor retractions. Breath sounds are clear and equal bilaterally. No wheezes/rales/rhonchi. Gastrointestinal: Soft and non tender. No rebound. No guarding.  Genitourinary: Deferred Musculoskeletal: Normal range of motion in all extremities. No lower extremity edema. Neurologic:  Normal speech and language. No gross focal neurologic deficits are appreciated.  Skin:  Skin is warm, dry and intact. No rash noted. Psychiatric: Mood and affect are normal. Speech and behavior are normal. Patient exhibits appropriate insight and judgment.  ____________________________________________    LABS (pertinent positives/negatives)  BMP Na 129 Ch 94 Glu 199 Cr 0.76 CBC WBC: 9.4 Hgb: 13.8 Plt: 278 UA Color: straw Appearance: clear Hgb: neg Ketones: neg Nitrite: neg Leukocytes: neg RBC: none seen WBC: 0-5 Bacteria: none seen Squamous Epi: none seen    ____________________________________________   EKG  None  ____________________________________________    RADIOLOGY  CT renal No acute process.    ____________________________________________   PROCEDURES  Procedures  ____________________________________________   INITIAL IMPRESSION / ASSESSMENT AND PLAN / ED COURSE  Pertinent labs & imaging results that were available during my care of the patient were reviewed by me and considered in my medical decision making (see chart for details).  patient presented to the emergency department today because of concerns for left flank pain. Differential would include diverticulitis, kidney stone, pyelonephritis among other things. Blood work and urine without concerning findings CT stone study was performed given the patient history of kidney stones. This did not show any obvious kidney stone. Patient however did state on reexam that feels like he might  of passed something. This point think recently passed stone a possibility. Will plan on discharging and discussed return precautions and follow-up with patient.  ____________________________________________   FINAL CLINICAL IMPRESSION(S) / ED DIAGNOSES  Final diagnoses:  Left flank pain     Note: This dictation was prepared with Dragon dictation. Any transcriptional errors that result from this process are unintentional     Nance Pear, MD 10/25/16 1901

## 2016-10-25 NOTE — Discharge Instructions (Signed)
Please seek medical attention for any high fevers, chest pain, shortness of breath, change in behavior, persistent vomiting, bloody stool or any other new or concerning symptoms.  

## 2016-10-26 ENCOUNTER — Other Ambulatory Visit: Payer: Self-pay | Admitting: Family Medicine

## 2016-10-26 NOTE — Telephone Encounter (Signed)
Last OV 09/25/2016 Next OV 12/10/2016 Last refill 10/12/2016

## 2016-10-27 ENCOUNTER — Telehealth: Payer: Self-pay | Admitting: Family Medicine

## 2016-10-27 NOTE — Telephone Encounter (Signed)
fyi

## 2016-10-27 NOTE — Telephone Encounter (Signed)
Patient stated he feels wonderful now he has passed the stone, denies any hematuria, or dysuria at this time will be seen if returns.

## 2016-10-27 NOTE — Telephone Encounter (Signed)
FYI - Pt called and wanted Dr. Biagio Quint to know that he went to the ED on Sunday because he felt that he had a kidney stone. Pt stated that he waited so long while at [facility] that he passed the stone. He was advised to call and just to let us know.

## 2016-10-27 NOTE — Telephone Encounter (Signed)
Noted  

## 2016-10-27 NOTE — Telephone Encounter (Signed)
Noted. If he has recurrent issues or if he is urinating blood he should be reevaluated.

## 2016-11-09 ENCOUNTER — Telehealth: Payer: Self-pay | Admitting: Pharmacist

## 2016-11-09 NOTE — Telephone Encounter (Signed)
Patient called to let me know how CBGs have been doing. Patient was extremely happy with his CBGs and reports the below:  FBG: 90s-120s, lowest = 87 PPBG: 160s-170s  Endorses one subjective low, did not check CBG, happened after he skipped breakfast. Otherwise, reports feeling better than he has in a long time.  Reports 100% adherence to medication regimen.   Congratulated patient on his progress and encouraged him to keep up the good work. Has f/u with Dr. [name] in November.   Carlean Jews, Pharm.D. PGY2 Ambulatory Care Pharmacy Resident Phone: [phone]

## 2016-11-12 ENCOUNTER — Ambulatory Visit: Payer: Medicare Other | Attending: Nurse Practitioner | Admitting: Nurse Practitioner

## 2016-11-13 ENCOUNTER — Telehealth: Payer: Self-pay | Admitting: Family Medicine

## 2016-11-13 NOTE — Telephone Encounter (Signed)
Patient currently taking ARBs therapy Olmesartan since 09/21/16.

## 2016-11-23 ENCOUNTER — Other Ambulatory Visit: Payer: Self-pay | Admitting: Family Medicine

## 2016-11-23 ENCOUNTER — Telehealth: Payer: Self-pay | Admitting: Family Medicine

## 2016-11-23 NOTE — Telephone Encounter (Signed)
Please triage

## 2016-11-23 NOTE — Telephone Encounter (Signed)
Patient has been having intermittent dizziness for about 2 weeks now. Blood pressure medication was changed a few weeks ago. He is not having any other acute symptoms. Advised walk in or appt at [facility] office. Patient says that he is going to have a procedure done tomorrow so he does not want an appointment at this time. He would like to monitor symptoms and call us back if they worsen.

## 2016-11-23 NOTE — Telephone Encounter (Signed)
Please advise for refill, last OV Was 09/25/2016, last refills was 10/27/16, #20 with no refills, thanks

## 2016-11-23 NOTE — Telephone Encounter (Signed)
Pt lvm stating that he has been experiencing vertigo x 2 wks and doesn't know if it is because his BP med has been changed, or if it is because he has been taking Claritin. Pt would like a cb. Pt cb [phone]

## 2016-11-23 NOTE — Telephone Encounter (Signed)
Potentially this could be related to the new medication though could be related to a number of other issues. I would suggest evaluation if he is willing.

## 2016-11-24 ENCOUNTER — Ambulatory Visit (HOSPITAL_BASED_OUTPATIENT_CLINIC_OR_DEPARTMENT_OTHER): Payer: Medicare Other | Admitting: Pain Medicine

## 2016-11-24 ENCOUNTER — Encounter: Payer: Self-pay | Admitting: Pain Medicine

## 2016-11-24 ENCOUNTER — Ambulatory Visit
Admission: RE | Admit: 2016-11-24 | Discharge: 2016-11-24 | Disposition: A | Discharge status: Home / Self Care | Payer: Medicare Other | Source: Ambulatory Visit | Attending: Pain Medicine | Admitting: Pain Medicine

## 2016-11-24 VITALS — BP 151/86 | HR 72 | Temp 97.1°F | Resp 14 | Ht 73.0 in | Wt 257.0 lb

## 2016-11-24 DIAGNOSIS — G8918 Other acute postprocedural pain: Secondary | ICD-10-CM | POA: Diagnosis not present

## 2016-11-24 DIAGNOSIS — G894 Chronic pain syndrome: Secondary | ICD-10-CM | POA: Diagnosis not present

## 2016-11-24 DIAGNOSIS — Z91041 Radiographic dye allergy status: Secondary | ICD-10-CM | POA: Diagnosis not present

## 2016-11-24 DIAGNOSIS — Z79891 Long term (current) use of opiate analgesic: Secondary | ICD-10-CM | POA: Diagnosis not present

## 2016-11-24 DIAGNOSIS — K5903 Drug induced constipation: Secondary | ICD-10-CM | POA: Diagnosis not present

## 2016-11-24 DIAGNOSIS — G629 Polyneuropathy, unspecified: Secondary | ICD-10-CM | POA: Diagnosis not present

## 2016-11-24 DIAGNOSIS — Z794 Long term (current) use of insulin: Secondary | ICD-10-CM | POA: Insufficient documentation

## 2016-11-24 DIAGNOSIS — T402X5A Adverse effect of other opioids, initial encounter: Secondary | ICD-10-CM

## 2016-11-24 DIAGNOSIS — M47816 Spondylosis without myelopathy or radiculopathy, lumbar region: Secondary | ICD-10-CM | POA: Diagnosis not present

## 2016-11-24 DIAGNOSIS — Z7982 Long term (current) use of aspirin: Secondary | ICD-10-CM | POA: Insufficient documentation

## 2016-11-24 DIAGNOSIS — R55 Syncope and collapse: Secondary | ICD-10-CM

## 2016-11-24 DIAGNOSIS — M488X6 Other specified spondylopathies, lumbar region: Secondary | ICD-10-CM | POA: Diagnosis not present

## 2016-11-24 DIAGNOSIS — Z79899 Other long term (current) drug therapy: Secondary | ICD-10-CM | POA: Insufficient documentation

## 2016-11-24 DIAGNOSIS — G8929 Other chronic pain: Secondary | ICD-10-CM

## 2016-11-24 DIAGNOSIS — Z91013 Allergy to seafood: Secondary | ICD-10-CM | POA: Insufficient documentation

## 2016-11-24 DIAGNOSIS — M5442 Lumbago with sciatica, left side: Secondary | ICD-10-CM

## 2016-11-24 DIAGNOSIS — Z87898 Personal history of other specified conditions: Secondary | ICD-10-CM

## 2016-11-24 DIAGNOSIS — M5441 Lumbago with sciatica, right side: Secondary | ICD-10-CM

## 2016-11-24 DIAGNOSIS — M545 Low back pain: Secondary | ICD-10-CM | POA: Insufficient documentation

## 2016-11-24 DIAGNOSIS — Z96649 Presence of unspecified artificial hip joint: Secondary | ICD-10-CM | POA: Insufficient documentation

## 2016-11-24 DIAGNOSIS — M792 Neuralgia and neuritis, unspecified: Secondary | ICD-10-CM

## 2016-11-24 HISTORY — DX: Other acute postprocedural pain: G89.18

## 2016-11-24 MED ORDER — FENTANYL CITRATE (PF) 100 MCG/2ML IJ SOLN
INTRAMUSCULAR | Status: AC
  Filled 2016-11-24: qty 2

## 2016-11-24 MED ORDER — GLYCOPYRROLATE 0.2 MG/ML IJ SOLN
INTRAMUSCULAR | Status: AC
  Filled 2016-11-24: qty 1

## 2016-11-24 MED ORDER — ROPIVACAINE HCL 2 MG/ML IJ SOLN
INTRAMUSCULAR | Status: AC
  Filled 2016-11-24: qty 10

## 2016-11-24 MED ORDER — GLYCOPYRROLATE 0.2 MG/ML IJ SOLN
0.2000 mg | Freq: Once | INTRAMUSCULAR | Status: AC
  Administered 2016-11-24: 0.2 mg via INTRAVENOUS

## 2016-11-24 MED ORDER — OXYCODONE HCL 10 MG PO TABS: 10.0000 mg | ORAL_TABLET | Freq: Four times a day (QID) | ORAL | 0 refills | Status: DC | PRN

## 2016-11-24 MED ORDER — MIDAZOLAM HCL 5 MG/5ML IJ SOLN
1.0000 mg | INTRAMUSCULAR | Status: AC | PRN
  Administered 2016-11-24: 2 mg via INTRAVENOUS

## 2016-11-24 MED ORDER — PREGABALIN 75 MG PO CAPS: 75.0000 mg | ORAL_CAPSULE | Freq: Two times a day (BID) | ORAL | 0 refills | Status: DC

## 2016-11-24 MED ORDER — TRIAMCINOLONE ACETONIDE 40 MG/ML IJ SUSP
INTRAMUSCULAR | Status: AC
  Filled 2016-11-24: qty 1

## 2016-11-24 MED ORDER — TRIAMCINOLONE ACETONIDE 40 MG/ML IJ SUSP
40.0000 mg | Freq: Once | INTRAMUSCULAR | Status: AC
  Administered 2016-11-24: 40 mg

## 2016-11-24 MED ORDER — ONDANSETRON HCL 4 MG/2ML IJ SOLN
INTRAMUSCULAR | Status: AC
  Filled 2016-11-24: qty 2

## 2016-11-24 MED ORDER — LACTATED RINGERS IV SOLN
1000.0000 mL | Freq: Once | INTRAVENOUS | Status: AC
  Administered 2016-11-24: 1000 mL via INTRAVENOUS

## 2016-11-24 MED ORDER — ROPIVACAINE HCL 2 MG/ML IJ SOLN
9.0000 mL | Freq: Once | INTRAMUSCULAR | Status: AC
  Administered 2016-11-24: 10 mL via PERINEURAL

## 2016-11-24 MED ORDER — ONDANSETRON HCL 4 MG/2ML IJ SOLN
4.0000 mg | INTRAMUSCULAR | Status: DC | PRN
  Administered 2016-11-24: 4 mg via INTRAVENOUS

## 2016-11-24 MED ORDER — LUBIPROSTONE 24 MCG PO CAPS: 24.0000 ug | ORAL_CAPSULE | Freq: Two times a day (BID) | ORAL | 0 refills | Status: DC

## 2016-11-24 MED ORDER — LIDOCAINE HCL 2 % IJ SOLN
10.0000 mL | Freq: Once | INTRAMUSCULAR | Status: AC
  Administered 2016-11-24: 400 mg

## 2016-11-24 MED ORDER — FENTANYL CITRATE (PF) 100 MCG/2ML IJ SOLN
25.0000 ug | INTRAMUSCULAR | Status: AC | PRN
Start: 2016-11-24 — End: 2016-11-24
  Administered 2016-11-24: 100 ug via INTRAVENOUS

## 2016-11-24 MED ORDER — LIDOCAINE HCL 2 % IJ SOLN
INTRAMUSCULAR | Status: AC
  Filled 2016-11-24: qty 20

## 2016-11-24 MED ORDER — MIDAZOLAM HCL 5 MG/5ML IJ SOLN
INTRAMUSCULAR | Status: AC
  Filled 2016-11-24: qty 5

## 2016-11-24 NOTE — Telephone Encounter (Signed)
LMTCB

## 2016-11-24 NOTE — Patient Instructions (Addendum)
____________________________________________________________________________________________  Post-Procedure instructions Instructions:  Apply ice: Fill a plastic sandwich bag with crushed ice. Cover it with a small towel and apply to injection site. Apply for 15 minutes then remove x 15 minutes. Repeat sequence on day of procedure, until you go to bed. The purpose is to minimize swelling and discomfort after procedure.  Apply heat: Apply heat to procedure site starting the day following the procedure. The purpose is to treat any soreness and discomfort from the procedure.  Food intake: Start with clear liquids (like water) and advance to regular food, as tolerated.   Physical activities: Keep activities to a minimum for the first 8 hours after the procedure.   Driving: If you have received any sedation, you are not allowed to drive for 24 hours after your procedure.  Blood thinner: Restart your blood thinner 6 hours after your procedure. (Only for those taking blood thinners)  Insulin: As soon as you can eat, you may resume your normal dosing schedule. (Only for those taking insulin)  Infection prevention: Keep procedure site clean and dry.  Post-procedure Pain Diary: Extremely important that this be done correctly and accurately. Recorded information will be used to determine the next step in treatment.  Pain evaluated is that of treated area only. Do not include pain from an untreated area.  Complete every hour, on the hour, for the initial 8 hours. Set an alarm to help you do this part accurately.  Do not go to sleep and have it completed later. It will not be accurate.  Follow-up appointment: Keep your follow-up appointment after the procedure. Usually 2 weeks for most procedures. (6 weeks in the case of radiofrequency.) Bring you pain diary.  Expect:  From numbing medicine (AKA: Local Anesthetics): Numbness or decrease in pain.  Onset: Full effect within 15 minutes of  injected.  Duration: It will depend on the type of local anesthetic used. On the average, 1 to 8 hours.   From steroids: Decrease in swelling or inflammation. Once inflammation is improved, relief of the pain will follow.  Onset of benefits: Depends on the amount of swelling present. The more swelling, the longer it will take for the benefits to be seen. In some cases, up to 10 days.  Duration: Steroids will stay in the system x 2 weeks. Duration of benefits will depend on multiple posibilities including persistent irritating factors.  From procedure: Some discomfort is to be expected once the numbing medicine wears off. This should be minimal if ice and heat are applied as instructed. Call if:  You experience numbness and weakness that gets worse with time, as opposed to wearing off.  New onset bowel or bladder incontinence. (Spinal procedures only)  Emergency Numbers:  Durning business hours (Monday - Thursday, 8:00 AM - 4:00 PM) (Friday, 9:00 AM - 12:00 Noon): [phone]  After hours: [phone] ____________________________________________________________________________________________  Pain Management Discharge Instructions  General Discharge Instructions :  If you need to reach your doctor call: Monday-Friday 8:00 am - 4:00 pm at [phone] or toll free [phone].  After clinic hours [phone] to have operator reach doctor.  Bring all of your medication bottles to all your appointments in the pain clinic.  To cancel or reschedule your appointment with Pain Management please remember to call 24 hours in advance to avoid a fee.  Refer to the educational materials which you have been given on: General Risks, I had my Procedure. Discharge Instructions, Post Sedation.  Post Procedure Instructions:  The drugs you   were given will stay in your system until tomorrow, so for the next 24 hours you should not drive, make any legal decisions or drink any alcoholic  beverages.  You may eat anything you prefer, but it is better to start with liquids then soups and crackers, and gradually work up to solid foods.  Please notify your doctor immediately if you have any unusual bleeding, trouble breathing or pain that is not related to your normal pain.  Depending on the type of procedure that was done, some parts of your body may feel week and/or numb.  This usually clears up by tonight or the next day.  Walk with the use of an assistive device or accompanied by an adult for the 24 hours.  You may use ice on the affected area for the first 24 hours.  Put ice in a Ziploc bag and cover with a towel and place against area 15 minutes on 15 minutes off.  You may switch to heat after 24 hours.Radiofrequency Lesioning, Care After Refer to this sheet in the next few weeks. These instructions provide you with information about caring for yourself after your procedure. Your health care provider may also give you more specific instructions. Your treatment has been planned according to current medical practices, but problems sometimes occur. Call your health care provider if you have any problems or questions after your procedure. What can I expect after the procedure? After the procedure, it is common to have:  Pain from the burned nerve.  Temporary numbness.  Follow these instructions at home:  Take over-the-counter and prescription medicines only as told by your health care provider.  Return to your normal activities as told by your health care provider. Ask your health care provider what activities are safe for you.  Pay close attention to how you feel after the procedure. If you start to have pain, write down when it hurts and how it feels. This will help you and your health care provider to know if you need an additional treatment.  Check your needle insertion site every day for signs of infection. Watch for: ? Redness, swelling, or pain. ? Fluid, blood, or  pus.  Keep all follow-up visits as told by your health care provider. This is important. Contact a health care provider if:  Your pain does not get better.  You have redness, swelling, or pain at the needle insertion site.  You have fluid, blood, or pus coming from the needle insertion site.  You have a fever. Get help right away if:  You develop sudden, severe pain.  You develop numbness or tingling near the procedure site that does not go away. This information is not intended to replace advice given to you by your health care provider. Make sure you discuss any questions you have with your health care provider. Document Released: 09/18/2010 Document Revised: 06/27/2015 Document Reviewed: 02/26/2014 Elsevier Interactive Patient Education  2018 [place]. Radiofrequency Lesioning Radiofrequency lesioning is a procedure that is performed to relieve pain. The procedure is often used for back, neck, or arm pain. Radiofrequency lesioning involves the use of a machine that creates radio waves to make heat. During the procedure, the heat is applied to the nerve that carries the pain signal. The heat damages the nerve and interferes with the pain signal. Pain relief usually starts about 2 weeks after the procedure and lasts for 6 months to 1 year. Tell a health care provider about:  Any allergies you have.  All  medicines you are taking, including vitamins, herbs, eye drops, creams, and over-the-counter medicines.  Any problems you or family members have had with anesthetic medicines.  Any blood disorders you have.  Any surgeries you have had.  Any medical conditions you have.  Whether you are pregnant or may be pregnant. What are the risks? Generally, this is a safe procedure. However, problems may occur, including:  Pain or soreness at the injection site.  Infection at the injection site.  Damage to nerves or blood vessels.  What happens before the procedure?  Ask your  health care provider about: ? Changing or stopping your regular medicines. This is especially important if you are taking diabetes medicines or blood thinners. ? Taking medicines such as aspirin and ibuprofen. These medicines can thin your blood. Do not take these medicines before your procedure if your health care provider instructs you not to.  Follow instructions from your health care provider about eating or drinking restrictions.  Plan to have someone take you home after the procedure.  If you go home right after the procedure, plan to have someone with you for 24 hours. What happens during the procedure?  You will be given one or more of the following: ? A medicine to help you relax (sedative). ? A medicine to numb the area (local anesthetic).  You will be awake during the procedure. You will need to be able to talk with the health care provider during the procedure.  With the help of a type of X-ray (fluoroscopy), the health care provider will insert a radiofrequency needle into the area to be treated.  Next, a wire that carries the radio waves (electrode) will be put through the radiofrequency needle. An electrical pulse will be sent through the electrode to verify the correct nerve. You will feel a tingling sensation, and you may have muscle twitching.  Then, the tissue that is around the needle tip will be heated by an electric current that is passed using the radiofrequency machine. This will numb the nerves.  A bandage (dressing) will be put on the insertion area after the procedure is done. The procedure may vary among health care providers and hospitals. What happens after the procedure?  Your blood pressure, heart rate, breathing rate, and blood oxygen level will be monitored often until the medicines you were given have worn off.  Return to your normal activities as directed by your health care provider. This information is not intended to replace advice given to you by  your health care provider. Make sure you discuss any questions you have with your health care provider. Document Released: 09/17/2010 Document Revised: 06/27/2015 Document Reviewed: 02/26/2014 Elsevier Interactive Patient Education  [place].

## 2016-11-24 NOTE — Progress Notes (Signed)
Patient's Name: Alan Schmidt  MRN: [id number]  Referring Provider: [name], MD  DOB: [birth year]  PCP: [name], MD  DOS: 11/24/2016  Note by: Gaspar Cola, MD  Service setting: Ambulatory outpatient  Specialty: Interventional Pain Management  Patient type: Established  Location: [facility] (AMB) Pain Management Facility  Visit type: Interventional Procedure   Primary Reason for Visit: Interventional Pain Management Treatment. CC: Back Pain (lower)  Procedure:  Anesthesia, Analgesia, Anxiolysis:  Type: Therapeutic Medial Branch Facet Radiofrequency Ablation Region: Lumbar Level: L2, L3, L4, L5, & S1 Medial Branch Level(s) Laterality: Right-Sided  Type: Local Anesthesia with Moderate (Conscious) Sedation Local Anesthetic: Lidocaine 1% Route: Intravenous (IV) IV Access: Secured Sedation: Meaningful verbal contact was maintained at all times during the procedure  Indication(s): Analgesia and Anxiety   Indications: 1. Lumbar facet syndrome (Bilateral) (R>L)   2. Lumbar spondylosis   3. Chronic low back pain (Primary Source of Pain) (Bilateral) (R>L)   4. History of postoperative nausea and vomiting   5. History of Vasovagal response to spinal injections   6. Acute postoperative pain   7. Chronic pain syndrome   8. Neuropathic pain   9. Neurogenic pain   10. Opioid-induced constipation (OIC)    [name] has either failed to respond, was unable to tolerate, or simply did not get enough benefit from other more conservative therapies including, but not limited to: 1. Over-the-counter medications 2. Anti-inflammatory medications 3. Muscle relaxants 4. Membrane stabilizers 5. Opioids 6. Physical therapy 7. Modalities (Heat, ice, etc.) 8. Invasive techniques such as nerve blocks. [name] has attained more than 50% relief of the pain from a series of diagnostic injections conducted in separate occasions.  Pain Score: Pre-procedure: 7  /10 Post-procedure: 3 /10  Pre-op Assessment:  [name] is a 71 y.o. (year old), male patient, seen today for interventional treatment. He  has a past surgical history that includes Total hip arthroplasty; Tonsillectomy; and right hip surgery. [name] has a current medication list which includes the following prescription(s): accu-chek compact plus, acetaminophen, aspirin ec, bd pen needle nano u/f, carvedilol, desoximetasone, fluoxetine, hydrochlorothiazide, hydroxyzine, insulin degludec, insulin lispro, loratadine, lubiprostone, magnesium oxide, metformin, mometasone, naloxone, olmesartan, oxycodone hcl, oxycodone hcl, oxycodone hcl, pravastatin, pregabalin, tamsulosin, victoza, and alprazolam, and the following Facility-Administered Medications: ondansetron. His primarily concern today is the Back Pain (lower)  Initial Vital Signs: There were no vitals taken for this visit. BMI: Estimated body mass index is 33.91 kg/m as calculated from the following:   Height as of this encounter: 6\' 1"  (1.854 m).   Weight as of this encounter: 257 lb (116.6 kg).  Risk Assessment: Allergies: Reviewed. He is allergic to contrast media [iodinated diagnostic agents]; iodine; and shellfish allergy.  Allergy Precautions: No radiological contrast used Coagulopathies: Reviewed. None identified.  Blood-thinner therapy: None at this time Active Infection(s): Reviewed. None identified. [name] is afebrile  Site Confirmation: [name] was asked to confirm the procedure and laterality before marking the site Procedure checklist: Completed Consent: Before the procedure and under the influence of no sedative(s), amnesic(s), or anxiolytics, the patient was informed of the treatment options, risks and possible complications. To fulfill our ethical and legal obligations, as recommended by the American Medical Association's Code of Ethics, I have informed the patient of my clinical impression; the nature and  purpose of the treatment or procedure; the risks, benefits, and possible complications of the intervention; the alternatives, including doing nothing; the risk(s) and benefit(s) of the alternative  treatment(s) or procedure(s); and the risk(s) and benefit(s) of doing nothing. The patient was provided information about the general risks and possible complications associated with the procedure. These may include, but are not limited to: failure to achieve desired goals, infection, bleeding, organ or nerve damage, allergic reactions, paralysis, and death. In addition, the patient was informed of those risks and complications associated to Spine-related procedures, such as failure to decrease pain; infection (i.e.: Meningitis, epidural or intraspinal abscess); bleeding (i.e.: epidural hematoma, subarachnoid hemorrhage, or any other type of intraspinal or peri-dural bleeding); organ or nerve damage (i.e.: Any type of peripheral nerve, nerve root, or spinal cord injury) with subsequent damage to sensory, motor, and/or autonomic systems, resulting in permanent pain, numbness, and/or weakness of one or several areas of the body; allergic reactions; (i.e.: anaphylactic reaction); and/or death. Furthermore, the patient was informed of those risks and complications associated with the medications. These include, but are not limited to: allergic reactions (i.e.: anaphylactic or anaphylactoid reaction(s)); adrenal axis suppression; blood sugar elevation that in diabetics may result in ketoacidosis or comma; water retention that in patients with history of congestive heart failure may result in shortness of breath, pulmonary edema, and decompensation with resultant heart failure; weight gain; swelling or edema; medication-induced neural toxicity; particulate matter embolism and blood vessel occlusion with resultant organ, and/or nervous system infarction; and/or aseptic necrosis of one or more joints. Finally, the patient was  informed that Medicine is not an exact science; therefore, there is also the possibility of unforeseen or unpredictable risks and/or possible complications that may result in a catastrophic outcome. The patient indicated having understood very clearly. We have given the patient no guarantees and we have made no promises. Enough time was given to the patient to ask questions, all of which were answered to the patient's satisfaction. Mr. Schroth has indicated that he wanted to continue with the procedure. Attestation: I, the ordering provider, attest that I have discussed with the patient the benefits, risks, side-effects, alternatives, likelihood of achieving goals, and potential problems during recovery for the procedure that I have provided informed consent. Date: 11/24/2016; Time: 8:14 AM  Pre-Procedure Preparation:  Monitoring: As per clinic protocol. Respiration, ETCO2, SpO2, BP, heart rate and rhythm monitor placed and checked for adequate function Safety Precautions: Patient was assessed for positional comfort and pressure points before starting the procedure. Time-out: I initiated and conducted the "Time-out" before starting the procedure, as per protocol. The patient was asked to participate by confirming the accuracy of the "Time Out" information. Verification of the correct person, site, and procedure were performed and confirmed by me, the nursing staff, and the patient. "Time-out" conducted as per Joint Commission's Universal Protocol (UP.01.01.01). "Time-out" Date & Time: 11/24/2016; 1521 hrs.  Description of Procedure Process:   Position: Prone Target Area: For Lumbar Facet blocks, the target is the groove formed by the junction of the transverse process and superior articular process. For the L5 dorsal ramus, the target is the notch between superior articular process and sacral ala. For the S1 dorsal ramus, the target is the superior and lateral edge of the posterior S1 Sacral  foramen. Approach: Paraspinal approach. Area Prepped: Entire Posterior Lumbosacral Region Prepping solution: Hibiclens (4.0% Chlorhexidine gluconate solution) Safety Precautions: Aspiration looking for blood return was conducted prior to all injections. At no point did we inject any substances, as a needle was being advanced. No attempts were made at seeking any paresthesias. Safe injection practices and needle disposal techniques used. Medications properly checked  for expiration dates. SDV (single dose vial) medications used. Description of the Procedure: Protocol guidelines were followed. The patient was placed in position over the fluoroscopy table. The target area was identified and the area prepped in the usual manner. Skin desensitized using vapocoolant spray. Skin & deeper tissues infiltrated with local anesthetic. Appropriate amount of time allowed to pass for local anesthetics to take effect. Radiofrequency needles were introduced to the area of the medial branch at the junction of the superior articular process and transverse process using fluoroscopy. Using the [employer], sensory stimulation using 50 Hz was used to locate & identify the nerve, making sure that the needle was positioned such that there was no sensory stimulation below 0.3 V or above 0.7 V. Stimulation using 2 Hz was used to evaluate the motor component. Care was taken not to lesion any nerves that demonstrated motor stimulation of the lower extremities at an output of less than 2.5 times that of the sensory threshold, or a maximum of 2.0 V. Once satisfactory placement of the needles was achieved, the above solution was slowly injected after negative aspiration. After waiting for at least 2 minutes, the ablation was performed at 80 degrees C for 60 seconds.The needles were then removed and the area cleansed, making sure to leave some of the prepping solution back to take advantage of its long term bactericidal  properties. Intra-operative Compliance: Compliant  Illustration of the posterior view of the lumbar spine and the posterior neural structures. Laminae of L2 through S1 are labeled. DPRL5, dorsal primary ramus of L5; DPRS1, dorsal primary ramus of S1; DPR3, dorsal primary ramus of L3; FJ, facet (zygapophyseal) joint L3-L4; I, inferior articular process of L4; LB1, lateral branch of dorsal primary ramus of L1; IAB, inferior articular branches from L3 medial branch (supplies L4-L5 facet joint); IBP, intermediate branch plexus; MB3, medial branch of dorsal primary ramus of L3; NR3, third lumbar nerve root; S, superior articular process of L5; SAB, superior articular branches from L4 (supplies L4-5 facet joint also); TP3, transverse process of L3.  Vitals:   11/24/16 1614 11/24/16 1624 11/24/16 1634 11/24/16 1640  BP: (!) 143/82 (!) 143/82 (!) 154/84 (!) 151/86  Pulse:      Resp: 14 14 15 14   Temp: (!) 97.1 F (36.2 C)     TempSrc:      SpO2: 96% 96% 100% 99%  Weight:      Height:        Start Time: 1524 hrs. End Time: 1610 hrs. Materials & Medications:  Needle(s) Type: Teflon-coated, curved tip, Radiofrequency needle(s) Gauge: 22G Length: 10cm Medication(s): We administered lactated ringers, midazolam, fentaNYL, lidocaine, triamcinolone acetonide, ropivacaine (PF) 2 mg/mL (0.2%), glycopyrrolate, and ondansetron. Please see chart orders for dosing details.  Imaging Guidance (Spinal):  Type of Imaging Technique: Fluoroscopy Guidance (Spinal) Indication(s): Assistance in needle guidance and placement for procedures requiring needle placement in or near specific anatomical locations not easily accessible without such assistance. Exposure Time: Please see nurses notes. Contrast: None used. Fluoroscopic Guidance: I was personally present during the use of fluoroscopy. "Tunnel Vision Technique" used to obtain the best possible view of the target area. Parallax error corrected before commencing the  procedure. "Direction-depth-direction" technique used to introduce the needle under continuous pulsed fluoroscopy. Once target was reached, antero-posterior, oblique, and lateral fluoroscopic projection used confirm needle placement in all planes. Images permanently stored in EMR. Interpretation: No contrast injected. I personally interpreted the imaging intraoperatively. Adequate needle placement confirmed in multiple planes.  Permanent images saved into the patient's record.  Antibiotic Prophylaxis:  Indication(s): None identified Antibiotic given: None  Post-operative Assessment:  EBL: None Complications: No immediate post-treatment complications observed by team, or reported by patient. Note: The patient tolerated the entire procedure well. A repeat set of vitals were taken after the procedure and the patient was kept under observation following institutional policy, for this type of procedure. Post-procedural neurological assessment was performed, showing return to baseline, prior to discharge. The patient was provided with post-procedure discharge instructions, including a section on how to identify potential problems. Should any problems arise concerning this procedure, the patient was given instructions to immediately contact us, at any time, without hesitation. In any case, we plan to contact the patient by telephone for a follow-up status report regarding this interventional procedure. Comments:  No additional relevant information.  Plan of Care    Imaging Orders     DG C-Arm 1-60 Min-No Report  Procedure Orders     Radiofrequency,Lumbar  Medications ordered for procedure: Meds ordered this encounter  Medications  . lactated ringers infusion 1,000 mL  . midazolam (VERSED) 5 MG/5ML injection 1-2 mg    Make sure Flumazenil is available in the pyxis when using this medication. If oversedation occurs, administer 0.2 mg IV over 15 sec. If after 45 sec no response, administer 0.2 mg  again over 1 min; may repeat at 1 min intervals; not to exceed 4 doses (1 mg)  . fentaNYL (SUBLIMAZE) injection 25-50 mcg    Make sure Narcan is available in the pyxis when using this medication. In the event of respiratory depression (RR< 8/min): Titrate NARCAN (naloxone) in increments of 0.1 to 0.2 mg IV at 2-3 minute intervals, until desired degree of reversal.  . lidocaine (XYLOCAINE) 2 % (with pres) injection 200 mg  . triamcinolone acetonide (KENALOG-40) injection 40 mg  . ropivacaine (PF) 2 mg/mL (0.2%) (NAROPIN) injection 9 mL  . glycopyrrolate (ROBINUL) injection 0.2 mg  . ondansetron (ZOFRAN) injection 4 mg  . Oxycodone HCl 10 MG TABS    Sig: Take 1 tablet (10 mg total) by mouth every 6 (six) hours as needed.    Dispense:  120 tablet    Refill:  0    Do not place this medication, or any other prescription from our practice, on "Automatic Refill". Patient may have prescription filled one day early if pharmacy is closed on scheduled refill date. Do not fill until: 12/25/2016 To last until: 01/24/2017  . Oxycodone HCl 10 MG TABS    Sig: Take 1 tablet (10 mg total) by mouth every 6 (six) hours as needed.    Dispense:  120 tablet    Refill:  0    Do not place this medication, or any other prescription from our practice, on "Automatic Refill". Patient may have prescription filled one day early if pharmacy is closed on scheduled refill date. Do not fill until: 11/25/2016 To last until: 12/25/2016  . pregabalin (LYRICA) 75 MG capsule    Sig: Take 1 capsule (75 mg total) by mouth every 12 (twelve) hours.    Dispense:  180 capsule    Refill:  0    Do not place this medication, or any other prescription from our practice, on "Automatic Refill". Patient may have prescription filled one day early if pharmacy is closed on scheduled refill date.  . lubiprostone (AMITIZA) 24 MCG capsule    Sig: Take 1 capsule (24 mcg total) by mouth 2 (two) times daily with a  meal. Swallow the medication  whole. Do not break or chew the medication.    Dispense:  180 capsule    Refill:  0    Do not place this medication, or any other prescription from our practice, on "Automatic Refill". Patient may have prescription filled one day early if pharmacy is closed on scheduled refill date.  . Oxycodone HCl 10 MG TABS    Sig: Take 1 tablet (10 mg total) by mouth every 6 (six) hours as needed.    Dispense:  120 tablet    Refill:  0    Do not place this medication, or any other prescription from our practice, on "Automatic Refill". Patient may have prescription filled one day early if pharmacy is closed on scheduled refill date. Do not fill until: 01/24/2017 To last until: 02/23/17   Medications administered: We administered lactated ringers, midazolam, fentaNYL, lidocaine, triamcinolone acetonide, ropivacaine (PF) 2 mg/mL (0.2%), glycopyrrolate, and ondansetron.  See the medical record for exact dosing, route, and time of administration.  New Prescriptions   No medications on file   Disposition: Discharge home  Discharge Date & Time: 11/24/2016; 1645 hrs.   Physician-requested Follow-up: Return in about 2 weeks (around 12/08/2016) for RFA (fluoro + sedation): (L) L-FCT RFA. Future Appointments Date Time Provider [place]  12/10/2016 9:00 AM [name], MD [id number] None  02/23/2017 1:30 PM Vevelyn Francois, NP [facility] None   Primary Care Physician: [name], MD Location: [facility] Note by: Gaspar Cola, MD Date: 11/24/2016; Time: 4:26 PM  Disclaimer:  Medicine is not an Chief Strategy Officer. The only guarantee in medicine is that nothing is guaranteed. It is important to note that the decision to proceed with this intervention was based on the information collected from the patient. The Data and conclusions were drawn from the patient's questionnaire, the interview, and the physical examination. Because the information was provided in  large part by the patient, it cannot be guaranteed that it has not been purposely or unconsciously manipulated. Every effort has been made to obtain as much relevant data as possible for this evaluation. It is important to note that the conclusions that lead to this procedure are derived in large part from the available data. Always take into account that the treatment will also be dependent on availability of resources and existing treatment guidelines, considered by other Pain Management Practitioners as being common knowledge and practice, at the time of the intervention. For Medico-Legal purposes, it is also important to point out that variation in procedural techniques and pharmacological choices are the acceptable norm. The indications, contraindications, technique, and results of the above procedure should only be interpreted and judged by a Board-Certified Interventional Pain Specialist with extensive familiarity and expertise in the same exact procedure and technique.

## 2016-11-24 NOTE — Telephone Encounter (Signed)
Please fax Xanax. Patient needs vitamin D checked prior to considering refilling vitamin D supplement.

## 2016-11-24 NOTE — Progress Notes (Signed)
Safety precautions to be maintained throughout the outpatient stay will include: orient to surroundings, keep bed in low position, maintain call bell within reach at all times, provide assistance with transfer out of bed and ambulation.  

## 2016-11-25 ENCOUNTER — Telehealth: Payer: Self-pay | Admitting: *Deleted

## 2016-11-25 NOTE — Telephone Encounter (Signed)
After speaking with Dr Dossie Arbour and reviewing patient fall after returning home he asked me to call patient to assess patient condition at present.  Patient described what occurred on yesterday and states that his leg was very numb.  He does recall [place] telling him his leg would be numb but states he doesn't think he understood what we meant or the severity we were referring to and had never experienced this type sensation before with ablations.  I did explain to patient that each procedure can be very different and require different amounts of medication based on pain level and tolerance. Also explained to patient that RF instructions and description of procedure are printed on AVS when appt is scheduled as well as pain management D/C instructions post procedure.  Patient states he had not gotten around to reading this information.  Patient asked if I was in the room and asked if I could recollect the procedure and "his leg jumping".  Told patient, that I was in the room and was the nurse caring for him at his head and explained each step of the procedure to him.  Patient verbalized u/o information.  Explained that the twitching or "jumping" in his leg was from the nerve being lesioned.  He did remember the amount of discomfort and was explained that this was why we added additional numbing medicine and at that time Dr Dossie Arbour explained to him that there would be more numbness in the right leg and he should not bare weight on that leg until numbness has subsided.  Patient does remember this but again states he didn't quite realize the severity that he was referring to.  At this point I asked patient how his leg was currently and he stated that it is almost back to normal with only minimal numbness.  I also asked about fall and if he injured himself and felt as if he needed to be evaluated this afternoon in our clinic.  Patient states that he was just going to try and take it easy and was just very sore and felt he  needed to rest.  Told him that it was to be expected that his back where we performed the procedure to be sore and that he should take his pain medicine as prescribed and continue to use ice if he felt useful.  After patient felt reassurred and we ended the call, he then called me back to ask about a headache that he was having.  I asked patient where the headache was located and he states it was left frontal.  Asked patient if he had been eating and drinking plenty of fluids, stated yes.  Denies any mental confusion or fogginess.  Denies any weakness or numbness in extremities.  Denies any visual disturbances.  There was mention of concussion which was mentioned early d/t converstation  with his friend last evening re; fall.   Mr Flaum denies feeling as if he has concussion.  Patient does c/o dry mouth.  Encouraged patient to begin drinking fluids, water and to drink as much as he could to help correct possible dehydration after being NPO for most of the day on yesterday.  Again patient verbalizes u/o information. Patient again instructed that if at anytime he felt that he needed to be evaluated we would be glad to see him in our office.  Also instructed that after hour numbers were printed on AVS.

## 2016-11-25 NOTE — Telephone Encounter (Signed)
noted 

## 2016-11-25 NOTE — Telephone Encounter (Signed)
When calling the patient to schedule an appointment, he told me that yesterday after his RF his right leg was numb for several hours. He said he had a hard time getting into the apartment and after sitting for a couple of hours he attempted to get up and his leg was still numb. He fell backwards  onto the floor. He said he lay there for a while and finally got up. He said no one told him that his leg would be completely numb. He said he would have brought his cane or we should have given him crutches to help prevent him from falling. He doesn't understand why it was not communicated to him that he would be so numb for longer than two hours. He said he was hurting a lot today, and is trying to take it easy. After questioning him, he said he did not have anyone staying with him  once he got home. He said he had never experienced that kind of numbness before. He said he kept telling the nurse that his leg kept shaking and it took a long time for it to stop. He said he has a thousand questions and wants to figure out what happened during the procedure.

## 2016-11-25 NOTE — Telephone Encounter (Signed)
Attempted to call for post procedure follow-up. Message left. 

## 2016-11-27 ENCOUNTER — Other Ambulatory Visit: Payer: Self-pay | Admitting: Family Medicine

## 2016-11-27 ENCOUNTER — Telehealth: Payer: Self-pay | Admitting: Pharmacist

## 2016-11-27 NOTE — Telephone Encounter (Signed)
Left message to return call, while previously speaking to patient I did not notice him having slurred speech

## 2016-11-27 NOTE — Telephone Encounter (Signed)
Patient called stating that he had a radiofrequency spinal ablation 3 days ago and his CBGs have been high since then. Reports that they were initially in the 300s despite 100% adherence to DM medication. Confirmed he is taking Tresiba 40 units daily and humalog 10 units TID immediately prior to meals. Today, reports that his fasting CBG was 157 and they are improving but was concerned. Upon chart review, patient rec'd triamcinolone injection for the procedure on 11/24/2016. This, coupled with stress from procedure is likely to blame for elevated CBGs. Advised patient to call back if CBGs were in 200-300s again and encouraged 100% adherence to DM medications.   Of note,  Patient reports that he has experienced leg numbness since the ablation resulting in a fall backwards where he struck his head. Since then numbness in leg has "mostly" worn off - residual numbness in upper thigh reported.  Denies bleeding, visual changes, facial droop, slurred speech. On ASA 325 mg daily, no other antiplatelets or anticoagulants. Educated pt on s/sx stroke and advised that he call 911 if experiencing any.   Has f/u with Dr. [name] 12/10/2016. Patient to call me back if CBGs are elevated again.   Carlean Jews, Pharm.D. PGY2 Ambulatory Care Pharmacy Resident Phone: [phone]

## 2016-11-27 NOTE — Telephone Encounter (Addendum)
Please contact the patient and see when he hit his head. Please see if he had any residual injuries from this. Check and see if he has had any headaches. Confirm that he's had no vision changes or confusion. Thanks.

## 2016-11-27 NOTE — Telephone Encounter (Signed)
Patient states he hit his head on Tuesday 11/24/16. Patient states he fell on his right side mostly his hip and shoulder. He states he has been sore since the fall and still has numbness in right buttocks. Patient had head aches but not any more, no vision changes that he can notice.Patient states his minister told him he was slurring his words.

## 2016-11-27 NOTE — Telephone Encounter (Signed)
Left message to return call 

## 2016-11-27 NOTE — Telephone Encounter (Signed)
Given slurred words I would suggest evaluation. If he has had slurred words over the last couple of days he should be evaluated today. Thanks.

## 2016-11-30 NOTE — Telephone Encounter (Signed)
Patient notified and will go to [facility]

## 2016-12-01 DIAGNOSIS — S73101A Unspecified sprain of right hip, initial encounter: Secondary | ICD-10-CM | POA: Diagnosis not present

## 2016-12-01 DIAGNOSIS — S339XXA Sprain of unspecified parts of lumbar spine and pelvis, initial encounter: Secondary | ICD-10-CM | POA: Diagnosis not present

## 2016-12-03 ENCOUNTER — Telehealth: Payer: Self-pay | Admitting: Family Medicine

## 2016-12-03 ENCOUNTER — Encounter: Payer: Medicare Other | Admitting: Nurse Practitioner

## 2016-12-03 MED ORDER — ALPRAZOLAM 0.5 MG PO TABS: 0.5000 mg | ORAL_TABLET | Freq: Two times a day (BID) | ORAL | 2 refills | Status: DC | PRN

## 2016-12-03 NOTE — Telephone Encounter (Signed)
Please fax

## 2016-12-03 NOTE — Telephone Encounter (Signed)
Please advise 

## 2016-12-03 NOTE — Telephone Encounter (Signed)
faxed

## 2016-12-03 NOTE — Telephone Encounter (Signed)
Pt called about needing a 30 day supply for ALPRAZolam (XANAX) 0.5 MG tablet. Pt received 20 pills enough for 10 days. Please advise?  Pharmacy is [id number] - [place], [place]  Call pt @ [phone].  Pt wants Dr [name] to know nothing was out of place with his hip only just sore.

## 2016-12-10 ENCOUNTER — Encounter: Payer: Self-pay | Admitting: Family Medicine

## 2016-12-10 ENCOUNTER — Ambulatory Visit (INDEPENDENT_AMBULATORY_CARE_PROVIDER_SITE_OTHER): Payer: Medicare Other | Admitting: Family Medicine

## 2016-12-10 VITALS — BP 138/82 | HR 87 | Temp 98.2°F | Wt 269.1 lb

## 2016-12-10 DIAGNOSIS — R519 Headache, unspecified: Secondary | ICD-10-CM

## 2016-12-10 DIAGNOSIS — E1142 Type 2 diabetes mellitus with diabetic polyneuropathy: Secondary | ICD-10-CM | POA: Diagnosis not present

## 2016-12-10 DIAGNOSIS — G8929 Other chronic pain: Secondary | ICD-10-CM | POA: Diagnosis not present

## 2016-12-10 DIAGNOSIS — Z794 Long term (current) use of insulin: Secondary | ICD-10-CM | POA: Diagnosis not present

## 2016-12-10 DIAGNOSIS — L0292 Furuncle, unspecified: Secondary | ICD-10-CM | POA: Diagnosis not present

## 2016-12-10 DIAGNOSIS — M5442 Lumbago with sciatica, left side: Secondary | ICD-10-CM

## 2016-12-10 DIAGNOSIS — D229 Melanocytic nevi, unspecified: Secondary | ICD-10-CM | POA: Insufficient documentation

## 2016-12-10 DIAGNOSIS — R51 Headache: Secondary | ICD-10-CM

## 2016-12-10 DIAGNOSIS — H9202 Otalgia, left ear: Secondary | ICD-10-CM | POA: Insufficient documentation

## 2016-12-10 DIAGNOSIS — H6123 Impacted cerumen, bilateral: Secondary | ICD-10-CM | POA: Diagnosis not present

## 2016-12-10 DIAGNOSIS — M5441 Lumbago with sciatica, right side: Secondary | ICD-10-CM

## 2016-12-10 MED ORDER — DOXYCYCLINE HYCLATE 100 MG PO TABS: 100.0000 mg | ORAL_TABLET | Freq: Two times a day (BID) | ORAL | 0 refills | Status: DC

## 2016-12-10 NOTE — Assessment & Plan Note (Signed)
Bilateral ears irrigated.  Left TM visualized and normal.  Right ear canal with cerumen

## 2016-12-10 NOTE — Assessment & Plan Note (Addendum)
Refer to dermatology 

## 2016-12-10 NOTE — Assessment & Plan Note (Signed)
Potentially related to cerumen impaction.  No signs of infection.  I do not think 2 doses of Keflex would be enough to get rid of all signs of infection thus I do not think the area was infected.  Does have some tenderness over his left temple.  It would seem unlikely to be temporal arteritis though we will obtain a sed rate.  If elevated consider placing on prednisone and referring for biopsy.  If negative unlikely to be temporal arteritis given history.  No jaw claudication.

## 2016-12-10 NOTE — Assessment & Plan Note (Signed)
Improved control.  A1c.

## 2016-12-10 NOTE — Assessment & Plan Note (Addendum)
Status post radiofrequency ablation.  He did have a fall and was evaluated with imaging of his right hip.  No persistent deficits.  Doing well at this time.

## 2016-12-10 NOTE — Assessment & Plan Note (Signed)
Boil noted.  Already draining.  Encourage warm compresses.  Start on doxycycline.  If not improving in the next 2 days advised to be evaluated over the weekend.  He will schedule an appointment for Monday for follow-up with our nurse practitioner.  If improved by that time he will cancel the appointment.

## 2016-12-10 NOTE — Patient Instructions (Addendum)
Nice to see you. We will check lab work and contact you with the results. We will place you on an antibiotic for the groin lesion.  If this does not improve in the next 2 days please be reevaluated.  If not improving on Monday please contact us for an appointment. We will refer you to dermatology and ophthalmology. If you develop fevers, worsening skin lesion, or any new or changing symptoms please seek medical attention immediately.

## 2016-12-10 NOTE — Progress Notes (Signed)
[name], MD Phone: [phone]  Alan Schmidt is a 71 y.o. male who presents today for follow-up.  Diabetes: Sugars are much improved.  Taking Victoza, metformin, tresiba, Humalog.  Only one episode of hypoglycemia.  Taking Xanax and Prozac for anxiety.  Notes this is relatively well controlled.  No drowsiness.  Patient reports he had radiofrequency ablation several weeks ago.  When this occurred his right foot shook uncontrollably during the procedure.  He was advised his leg would be numb afterwards though he did not realize that it would be difficult to use for some period of time.  He tried to walk on it later that day and ended up falling backwards and landing on his right hip.  He did hit the back of his head.  He notes he was evaluated for this and had an x-ray of his right hip which he reports was okay.  He had numbness in the leg which has improved.  He notes no loss of consciousness.  No vision changes, numbness, or weakness.  Patient notes over the last several days.  He has felt as though he has had an ear infection in his left ear.  Notes the ear hurts from the anterior portion up towards his temple on the left side.  Notes the area has been tender.  He took 2 doses of Keflex which he thinks helped some.  He does report prior history of pain in this area.  No vision changes.  He notes there is a mole over his left temple he wants to get looked at.  Patient notes a right inguinal boil that has come and gone for some time now.  Currently draining.  It has gotten slightly smaller.  No redness.  No fevers.  Social History   Tobacco Use  Smoking Status Former Smoker  Smokeless Tobacco Never Used     ROS see history of present illness  Objective  Physical Exam Vitals:   12/10/16 0908  BP: 138/82  Pulse: 87  Temp: 98.2 F (36.8 C)  SpO2: 94%    BP Readings from Last 3 Encounters:  12/10/16 138/82  11/24/16 (!) 151/86  10/25/16 (!) 162/100   Wt Readings  from Last 3 Encounters:  12/10/16 269 lb 2 oz (122.1 kg)  11/24/16 257 lb (116.6 kg)  10/25/16 250 lb (113.4 kg)    Physical Exam  Constitutional: No distress.  HENT:  Head:    Mouth/Throat: Oropharynx is clear and moist. No oropharyngeal exudate.  Eyes: Conjunctivae are normal. Pupils are equal, round, and reactive to light.  Cardiovascular: Normal rate, regular rhythm and normal heart sounds.  Pulmonary/Chest: Effort normal and breath sounds normal.  Neurological: He is alert.  CN 2-12 intact, 5/5 strength in bilateral biceps, triceps, grip, quads, hamstrings, plantar and dorsiflexion, sensation to light touch intact in bilateral UE and LE, normal gait, slight tenderness of left temporal area and area over left TMJ, no palpable vasculature in this area, left TM normal, right TM unable to visualize even after ear irrigation  Skin: Skin is warm and dry. He is not diaphoretic.        Assessment/Plan: Please see individual problem list.  Diabetes ([place]) Improved control.  A1c.  Cerumen impaction Bilateral ears irrigated.  Left TM visualized and normal.  Right ear canal with cerumen  Chronic low back pain (Primary Source of Pain) (Bilateral) (R>L) Status post radiofrequency ablation.  He did have a fall and was evaluated with imaging of his right hip.  No persistent deficits.  Doing well at this time.  Ear pain, left Potentially related to cerumen impaction.  No signs of infection.  I do not think 2 doses of Keflex would be enough to get rid of all signs of infection thus I do not think the area was infected.  Does have some tenderness over his left temple.  It would seem unlikely to be temporal arteritis though we will obtain a sed rate.  If elevated consider placing on prednisone and referring for biopsy.  If negative unlikely to be temporal arteritis given history.  No jaw claudication.  Boil Boil noted.  Already draining.  Encourage warm compresses.  Start on doxycycline.  If  not improving in the next 2 days advised to be evaluated over the weekend.  He will schedule an appointment for Monday for follow-up with our nurse practitioner.  If improved by that time he will cancel the appointment.  Nev[place] Refer to dermatology.   Andreus was seen today for follow-up.  Diagnoses and all orders for this visit:  Type 2 diabetes mellitus with diabetic polyneuropathy, with long-term current use of insulin ([place]) -     Cancel: Basic Metabolic Panel (BMET) -     Cancel: HgB A1c -     Ambulatory referral to Ophthalmology -     HgB A1c; Future  [place] -     Ambulatory referral to Dermatology  Nonintractable headache, unspecified chronicity pattern, unspecified headache type -     Cancel: Sedimentation rate -     Basic Metabolic Panel (BMET); Future -     Sedimentation rate; Future  Bilateral impacted cerumen  Chronic low back pain (Primary Source of Pain) (Bilateral) (R>L)  Ear pain, left  Boil  Other orders -     doxycycline (VIBRA-TABS) 100 MG tablet; Take 1 tablet (100 mg total) 2 (two) times daily by mouth.    Orders Placed This Encounter  Procedures  . Basic Metabolic Panel (BMET)    Standing Status:   Future    Standing Expiration Date:   12/10/2017  . HgB A1c    Standing Status:   Future    Standing Expiration Date:   12/10/2017  . Sedimentation rate    Standing Status:   Future    Standing Expiration Date:   12/10/2017  . Ambulatory referral to Dermatology    Referral Priority:   Routine    Referral Type:   Consultation    Referral Reason:   Specialty Services Required    Requested Specialty:   Dermatology    Number of Visits Requested:   1  . Ambulatory referral to Ophthalmology    Referral Priority:   Routine    Referral Type:   Consultation    Referral Reason:   Specialty Services Required    Requested Specialty:   Ophthalmology    Number of Visits Requested:   1    Meds ordered this encounter  Medications  . doxycycline (VIBRA-TABS) 100  MG tablet    Sig: Take 1 tablet (100 mg total) 2 (two) times daily by mouth.    Dispense:  14 tablet    Refill:  0     [name], MD [place]

## 2016-12-11 ENCOUNTER — Other Ambulatory Visit: Payer: Medicare Other

## 2016-12-11 DIAGNOSIS — B351 Tinea unguium: Secondary | ICD-10-CM | POA: Diagnosis not present

## 2016-12-11 DIAGNOSIS — E119 Type 2 diabetes mellitus without complications: Secondary | ICD-10-CM | POA: Diagnosis not present

## 2016-12-11 DIAGNOSIS — L851 Acquired keratosis [keratoderma] palmaris et plantaris: Secondary | ICD-10-CM | POA: Diagnosis not present

## 2016-12-14 ENCOUNTER — Other Ambulatory Visit (INDEPENDENT_AMBULATORY_CARE_PROVIDER_SITE_OTHER): Payer: Medicare Other

## 2016-12-14 DIAGNOSIS — E1142 Type 2 diabetes mellitus with diabetic polyneuropathy: Secondary | ICD-10-CM | POA: Diagnosis not present

## 2016-12-14 DIAGNOSIS — Z794 Long term (current) use of insulin: Secondary | ICD-10-CM

## 2016-12-14 DIAGNOSIS — R519 Headache, unspecified: Secondary | ICD-10-CM

## 2016-12-14 DIAGNOSIS — R51 Headache: Secondary | ICD-10-CM | POA: Diagnosis not present

## 2016-12-14 LAB — BASIC METABOLIC PANEL
BUN: 13 mg/dL (ref 6–23)
CHLORIDE: 100 meq/L (ref 96–112)
CO2: 30 meq/L (ref 19–32)
Calcium: 9.1 mg/dL (ref 8.4–10.5)
Creatinine, Ser: 0.89 mg/dL (ref 0.40–1.50)
GFR: 108.42 mL/min (ref >60.00)
Glucose, Bld: 120 mg/dL — ABNORMAL HIGH (ref 70–99)
Potassium: 4.3 mEq/L (ref 3.5–5.1)
Sodium: 136 mEq/L (ref 135–145)

## 2016-12-14 LAB — HEMOGLOBIN A1C: HEMOGLOBIN A1C: 8.2 % — AB (ref 4.6–6.5)

## 2016-12-14 LAB — SEDIMENTATION RATE: Sed Rate: 45 mm/hr — ABNORMAL HIGH (ref 0–20)

## 2016-12-15 NOTE — Addendum Note (Signed)
Addended by: Johna Sheriff on: 12/15/2016 10:14 AM   Modules accepted: Orders

## 2016-12-15 NOTE — Progress Notes (Signed)
Bilateral ear irrigation.  Left ear irrigated without difficult patient tolerated well significant amount of wax removed. Right ear irrigated , however unable to remove wax due patient began to experience pain.

## 2016-12-17 ENCOUNTER — Other Ambulatory Visit: Payer: Self-pay | Admitting: Family Medicine

## 2016-12-22 ENCOUNTER — Ambulatory Visit: Payer: Medicare Other | Admitting: Pain Medicine

## 2017-01-07 ENCOUNTER — Other Ambulatory Visit: Payer: Self-pay | Admitting: Family Medicine

## 2017-01-09 ENCOUNTER — Other Ambulatory Visit: Payer: Self-pay | Admitting: Family Medicine

## 2017-01-09 DIAGNOSIS — R51 Headache: Principal | ICD-10-CM

## 2017-01-09 DIAGNOSIS — R519 Headache, unspecified: Secondary | ICD-10-CM

## 2017-01-18 DIAGNOSIS — E113293 Type 2 diabetes mellitus with mild nonproliferative diabetic retinopathy without macular edema, bilateral: Secondary | ICD-10-CM | POA: Diagnosis not present

## 2017-01-18 LAB — HM DIABETES EYE EXAM

## 2017-01-19 ENCOUNTER — Encounter: Payer: Self-pay | Admitting: Family Medicine

## 2017-01-19 ENCOUNTER — Ambulatory Visit: Payer: Medicare Other | Admitting: Pain Medicine

## 2017-01-19 NOTE — Progress Notes (Signed)
Error Patient has background diabetic retinopathy but no treatment was recommended per  eye center 01/18/2017

## 2017-02-06 ENCOUNTER — Other Ambulatory Visit: Payer: Self-pay | Admitting: Family Medicine

## 2017-02-15 ENCOUNTER — Other Ambulatory Visit: Payer: Self-pay | Admitting: Family Medicine

## 2017-02-15 ENCOUNTER — Telehealth: Payer: Self-pay | Admitting: Family Medicine

## 2017-02-15 NOTE — Telephone Encounter (Signed)
Please advise 

## 2017-02-15 NOTE — Telephone Encounter (Signed)
CRM for notification. See Telephone encounter for:   02/15/17.   Relation to pt: self Call back number: [phone] [id number] [place], [place] [phone] (Phone) [phone] (Fax)    Reason for call:  Patient requesting accu check test strips guide Rx for meter, patient states pharmacy contacted and he's checking on the status, patient aware 48 to 72 hour turn around time, please advise

## 2017-02-16 ENCOUNTER — Other Ambulatory Visit: Payer: Self-pay

## 2017-02-16 ENCOUNTER — Encounter: Payer: Self-pay | Admitting: Pain Medicine

## 2017-02-16 ENCOUNTER — Ambulatory Visit (HOSPITAL_BASED_OUTPATIENT_CLINIC_OR_DEPARTMENT_OTHER): Payer: Medicare Other | Admitting: Pain Medicine

## 2017-02-16 ENCOUNTER — Ambulatory Visit
Admission: RE | Admit: 2017-02-16 | Discharge: 2017-02-16 | Disposition: A | Discharge status: Home / Self Care | Payer: Medicare Other | Source: Ambulatory Visit | Attending: Pain Medicine | Admitting: Pain Medicine

## 2017-02-16 VITALS — BP 104/67 | HR 75 | Temp 96.4°F | Resp 17 | Ht 73.0 in | Wt 267.0 lb

## 2017-02-16 DIAGNOSIS — G894 Chronic pain syndrome: Secondary | ICD-10-CM | POA: Diagnosis not present

## 2017-02-16 DIAGNOSIS — T402X5A Adverse effect of other opioids, initial encounter: Secondary | ICD-10-CM | POA: Diagnosis not present

## 2017-02-16 DIAGNOSIS — M5441 Lumbago with sciatica, right side: Secondary | ICD-10-CM | POA: Diagnosis not present

## 2017-02-16 DIAGNOSIS — M25551 Pain in right hip: Secondary | ICD-10-CM | POA: Diagnosis not present

## 2017-02-16 DIAGNOSIS — G629 Polyneuropathy, unspecified: Secondary | ICD-10-CM | POA: Diagnosis not present

## 2017-02-16 DIAGNOSIS — M488X6 Other specified spondylopathies, lumbar region: Secondary | ICD-10-CM | POA: Insufficient documentation

## 2017-02-16 DIAGNOSIS — I9788 Other intraoperative complications of the circulatory system, not elsewhere classified: Secondary | ICD-10-CM

## 2017-02-16 DIAGNOSIS — K5903 Drug induced constipation: Secondary | ICD-10-CM | POA: Insufficient documentation

## 2017-02-16 DIAGNOSIS — M47816 Spondylosis without myelopathy or radiculopathy, lumbar region: Secondary | ICD-10-CM | POA: Diagnosis not present

## 2017-02-16 DIAGNOSIS — F419 Anxiety disorder, unspecified: Secondary | ICD-10-CM | POA: Diagnosis not present

## 2017-02-16 DIAGNOSIS — Z87898 Personal history of other specified conditions: Secondary | ICD-10-CM

## 2017-02-16 DIAGNOSIS — M792 Neuralgia and neuritis, unspecified: Secondary | ICD-10-CM

## 2017-02-16 DIAGNOSIS — M5442 Lumbago with sciatica, left side: Secondary | ICD-10-CM | POA: Insufficient documentation

## 2017-02-16 DIAGNOSIS — Z7982 Long term (current) use of aspirin: Secondary | ICD-10-CM | POA: Insufficient documentation

## 2017-02-16 DIAGNOSIS — Z7951 Long term (current) use of inhaled steroids: Secondary | ICD-10-CM | POA: Insufficient documentation

## 2017-02-16 DIAGNOSIS — Z91013 Allergy to seafood: Secondary | ICD-10-CM | POA: Insufficient documentation

## 2017-02-16 DIAGNOSIS — Z794 Long term (current) use of insulin: Secondary | ICD-10-CM | POA: Diagnosis not present

## 2017-02-16 DIAGNOSIS — R55 Syncope and collapse: Secondary | ICD-10-CM

## 2017-02-16 DIAGNOSIS — I9589 Other hypotension: Secondary | ICD-10-CM

## 2017-02-16 DIAGNOSIS — Z79899 Other long term (current) drug therapy: Secondary | ICD-10-CM | POA: Diagnosis not present

## 2017-02-16 DIAGNOSIS — Z91041 Radiographic dye allergy status: Secondary | ICD-10-CM | POA: Insufficient documentation

## 2017-02-16 DIAGNOSIS — G8929 Other chronic pain: Secondary | ICD-10-CM

## 2017-02-16 DIAGNOSIS — Z96641 Presence of right artificial hip joint: Secondary | ICD-10-CM | POA: Insufficient documentation

## 2017-02-16 DIAGNOSIS — I959 Hypotension, unspecified: Secondary | ICD-10-CM | POA: Insufficient documentation

## 2017-02-16 MED ORDER — ONDANSETRON HCL 4 MG/2ML IJ SOLN
INTRAMUSCULAR | Status: AC
Start: 2017-02-16 — End: ?
  Filled 2017-02-16: qty 2

## 2017-02-16 MED ORDER — ONDANSETRON HCL 4 MG/2ML IJ SOLN
4.0000 mg | Freq: Once | INTRAMUSCULAR | Status: AC
  Administered 2017-02-16: 4 mg via INTRAVENOUS

## 2017-02-16 MED ORDER — EPHEDRINE SULFATE 50 MG/ML IJ SOLN
5.0000 mg | Freq: Once | INTRAMUSCULAR | Status: AC
  Administered 2017-02-16: 50 mg via INTRAVENOUS

## 2017-02-16 MED ORDER — EPHEDRINE SULFATE 50 MG/ML IJ SOLN
INTRAMUSCULAR | Status: AC
  Filled 2017-02-16: qty 1

## 2017-02-16 MED ORDER — OXYCODONE HCL 10 MG PO TABS: 10.0000 mg | ORAL_TABLET | Freq: Four times a day (QID) | ORAL | 0 refills | Status: DC | PRN

## 2017-02-16 MED ORDER — FENTANYL CITRATE (PF) 100 MCG/2ML IJ SOLN
25.0000 ug | INTRAMUSCULAR | Status: DC | PRN
  Administered 2017-02-16: 100 ug via INTRAVENOUS
  Filled 2017-02-16: qty 2

## 2017-02-16 MED ORDER — ROPIVACAINE HCL 2 MG/ML IJ SOLN
9.0000 mL | Freq: Once | INTRAMUSCULAR | Status: AC
  Administered 2017-02-16: 10 mL via PERINEURAL
  Filled 2017-02-16: qty 10

## 2017-02-16 MED ORDER — LIDOCAINE HCL 2 % IJ SOLN
10.0000 mL | Freq: Once | INTRAMUSCULAR | Status: AC
Start: 2017-02-16 — End: 2017-02-16
  Administered 2017-02-16: 200 mg
  Filled 2017-02-16: qty 40

## 2017-02-16 MED ORDER — MIDAZOLAM HCL 5 MG/5ML IJ SOLN
1.0000 mg | INTRAMUSCULAR | Status: DC | PRN
  Administered 2017-02-16: 3 mg via INTRAVENOUS
  Filled 2017-02-16: qty 5

## 2017-02-16 MED ORDER — TRIAMCINOLONE ACETONIDE 40 MG/ML IJ SUSP
40.0000 mg | Freq: Once | INTRAMUSCULAR | Status: AC
  Administered 2017-02-16: 40 mg
  Filled 2017-02-16: qty 1

## 2017-02-16 MED ORDER — PREGABALIN 75 MG PO CAPS: 75.0000 mg | ORAL_CAPSULE | Freq: Two times a day (BID) | ORAL | 0 refills | Status: DC

## 2017-02-16 MED ORDER — LACTATED RINGERS IV SOLN
1000.0000 mL | Freq: Once | INTRAVENOUS | Status: AC
  Administered 2017-02-16: 1000 mL via INTRAVENOUS

## 2017-02-16 MED ORDER — GLYCOPYRROLATE 0.2 MG/ML IJ SOLN
0.2000 mg | Freq: Once | INTRAMUSCULAR | Status: AC
  Administered 2017-02-16: 0.2 mg via INTRAVENOUS
  Filled 2017-02-16: qty 1

## 2017-02-16 MED ORDER — LUBIPROSTONE 24 MCG PO CAPS: 24.0000 ug | ORAL_CAPSULE | Freq: Two times a day (BID) | ORAL | 0 refills | Status: DC

## 2017-02-16 MED ORDER — BLOOD GLUCOSE MONITOR KIT: PACK | 0 refills | Status: DC

## 2017-02-16 NOTE — Progress Notes (Signed)
Safety precautions to be maintained throughout the outpatient stay will include: orient to surroundings, keep bed in low position, maintain call bell within reach at all times, provide assistance with transfer out of bed and ambulation.  

## 2017-02-16 NOTE — Patient Instructions (Addendum)
____________________________________________________________________________________________ Patient give prescriptions for oxycodone and Lyrica  Post-Procedure instructions Instructions:  Apply ice: Fill a plastic sandwich bag with crushed ice. Cover it with a small towel and apply to injection site. Apply for 15 minutes then remove x 15 minutes. Repeat sequence on day of procedure, until you go to bed. The purpose is to minimize swelling and discomfort after procedure.  Apply heat: Apply heat to procedure site starting the day following the procedure. The purpose is to treat any soreness and discomfort from the procedure.  Food intake: Start with clear liquids (like water) and advance to regular food, as tolerated.   Physical activities: Keep activities to a minimum for the first 8 hours after the procedure.   Driving: If you have received any sedation, you are not allowed to drive for 24 hours after your procedure.  Blood thinner: Restart your blood thinner 6 hours after your procedure. (Only for those taking blood thinners)  Insulin: As soon as you can eat, you may resume your normal dosing schedule. (Only for those taking insulin)  Infection prevention: Keep procedure site clean and dry.  Post-procedure Pain Diary: Extremely important that this be done correctly and accurately. Recorded information will be used to determine the next step in treatment.  Pain evaluated is that of treated area only. Do not include pain from an untreated area.  Complete every hour, on the hour, for the initial 8 hours. Set an alarm to help you do this part accurately.  Do not go to sleep and have it completed later. It will not be accurate.  Follow-up appointment: Keep your follow-up appointment after the procedure. Usually 2 weeks for most procedures. (6 weeks in the case of radiofrequency.) Bring you pain diary.  Expect:  From numbing medicine (AKA: Local Anesthetics): Numbness or decrease in  pain.  Onset: Full effect within 15 minutes of injected.  Duration: It will depend on the type of local anesthetic used. On the average, 1 to 8 hours.   From steroids: Decrease in swelling or inflammation. Once inflammation is improved, relief of the pain will follow.  Onset of benefits: Depends on the amount of swelling present. The more swelling, the longer it will take for the benefits to be seen. In some cases, up to 10 days.  Duration: Steroids will stay in the system x 2 weeks. Duration of benefits will depend on multiple posibilities including persistent irritating factors.  From procedure: Some discomfort is to be expected once the numbing medicine wears off. This should be minimal if ice and heat are applied as instructed. Call if:  You experience numbness and weakness that gets worse with time, as opposed to wearing off.  New onset bowel or bladder incontinence. (Spinal procedures only)  Emergency Numbers:  [place] business hours (Monday - Thursday, 8:00 AM - 4:00 PM) (Friday, 9:00 AM - 12:00 Noon): [phone]  After hours: [phone] ____________________________________________________________________________________________   ____________________________________________________________________________________________  Post-Procedure instructions Instructions:  Apply ice: Fill a plastic sandwich bag with crushed ice. Cover it with a small towel and apply to injection site. Apply for 15 minutes then remove x 15 minutes. Repeat sequence on day of procedure, until you go to bed. The purpose is to minimize swelling and discomfort after procedure.  Apply heat: Apply heat to procedure site starting the day following the procedure. The purpose is to treat any soreness and discomfort from the procedure.  Food intake: Start with clear liquids (like water) and advance to regular food, as tolerated.  Physical activities: Keep activities to a minimum for the first 8  hours after the procedure.   Driving: If you have received any sedation, you are not allowed to drive for 24 hours after your procedure.  Blood thinner: Restart your blood thinner 6 hours after your procedure. (Only for those taking blood thinners)  Insulin: As soon as you can eat, you may resume your normal dosing schedule. (Only for those taking insulin)  Infection prevention: Keep procedure site clean and dry.  Post-procedure Pain Diary: Extremely important that this be done correctly and accurately. Recorded information will be used to determine the next step in treatment.  Pain evaluated is that of treated area only. Do not include pain from an untreated area.  Complete every hour, on the hour, for the initial 8 hours. Set an alarm to help you do this part accurately.  Do not go to sleep and have it completed later. It will not be accurate.  Follow-up appointment: Keep your follow-up appointment after the procedure. Usually 2 weeks for most procedures. (6 weeks in the case of radiofrequency.) Bring you pain diary.  Expect:  From numbing medicine (AKA: Local Anesthetics): Numbness or decrease in pain.  Onset: Full effect within 15 minutes of injected.  Duration: It will depend on the type of local anesthetic used. On the average, 1 to 8 hours.   From steroids: Decrease in swelling or inflammation. Once inflammation is improved, relief of the pain will follow.  Onset of benefits: Depends on the amount of swelling present. The more swelling, the longer it will take for the benefits to be seen. In some cases, up to 10 days.  Duration: Steroids will stay in the system x 2 weeks. Duration of benefits will depend on multiple posibilities including persistent irritating factors.  From procedure: Some discomfort is to be expected once the numbing medicine wears off. This should be minimal if ice and heat are applied as instructed. Call if:  You experience numbness and weakness that  gets worse with time, as opposed to wearing off.  New onset bowel or bladder incontinence. (Spinal procedures only)  Emergency Numbers:  Durning business hours (Monday - Thursday, 8:00 AM - 4:00 PM) (Friday, 9:00 AM - 12:00 Noon): [phone]  After hours: [phone] ____________________________________________________________________________________________   ____________________________________________________________________________________________  Post-Procedure instructions Instructions:  Apply ice: Fill a plastic sandwich bag with crushed ice. Cover it with a small towel and apply to injection site. Apply for 15 minutes then remove x 15 minutes. Repeat sequence on day of procedure, until you go to bed. The purpose is to minimize swelling and discomfort after procedure.  Apply heat: Apply heat to procedure site starting the day following the procedure. The purpose is to treat any soreness and discomfort from the procedure.  Food intake: Start with clear liquids (like water) and advance to regular food, as tolerated.   Physical activities: Keep activities to a minimum for the first 8 hours after the procedure.   Driving: If you have received any sedation, you are not allowed to drive for 24 hours after your procedure.  Blood thinner: Restart your blood thinner 6 hours after your procedure. (Only for those taking blood thinners)  Insulin: As soon as you can eat, you may resume your normal dosing schedule. (Only for those taking insulin)  Infection prevention: Keep procedure site clean and dry.  Post-procedure Pain Diary: Extremely important that this be done correctly and accurately. Recorded information will be used to determine the next  step in treatment.  Pain evaluated is that of treated area only. Do not include pain from an untreated area.  Complete every hour, on the hour, for the initial 8 hours. Set an alarm to help you do this part accurately.  Do not go to  sleep and have it completed later. It will not be accurate.  Follow-up appointment: Keep your follow-up appointment after the procedure. Usually 2 weeks for most procedures. (6 weeks in the case of radiofrequency.) Bring you pain diary.  Expect:  From numbing medicine (AKA: Local Anesthetics): Numbness or decrease in pain.  Onset: Full effect within 15 minutes of injected.  Duration: It will depend on the type of local anesthetic used. On the average, 1 to 8 hours.   From steroids: Decrease in swelling or inflammation. Once inflammation is improved, relief of the pain will follow.  Onset of benefits: Depends on the amount of swelling present. The more swelling, the longer it will take for the benefits to be seen. In some cases, up to 10 days.  Duration: Steroids will stay in the system x 2 weeks. Duration of benefits will depend on multiple posibilities including persistent irritating factors.  From procedure: Some discomfort is to be expected once the numbing medicine wears off. This should be minimal if ice and heat are applied as instructed. Call if:  You experience numbness and weakness that gets worse with time, as opposed to wearing off.  New onset bowel or bladder incontinence. (Spinal procedures only)  Emergency Numbers:  [place] business hours (Monday - Thursday, 8:00 AM - 4:00 PM) (Friday, 9:00 AM - 12:00 Noon): [phone]  After hours: [phone] ____________________________________________________________________________________________

## 2017-02-16 NOTE — Telephone Encounter (Signed)
Printed.  Please fax.

## 2017-02-16 NOTE — Progress Notes (Signed)
Patient's Name: Alan Schmidt  MRN: [id number]  Referring Provider: [name], MD  DOB: Aug 20, 1945  PCP: [name], MD  DOS: 02/16/2017  Note by: Gaspar Cola, MD  Service setting: Ambulatory outpatient  Specialty: Interventional Pain Management  Patient type: Established  Location: [facility] (AMB) Pain Management Facility  Visit type: Interventional Procedure   Primary Reason for Visit: Interventional Pain Management Treatment. CC: Hip Pain (right)  Procedure:  Anesthesia, Analgesia, Anxiolysis:  Type: Therapeutic Medial Branch Facet Radiofrequency Ablation Region: Lumbar Level: L2, L3, L4, L5, & S1 Medial Branch Level(s) Laterality: Left-Sided  Type: Local Anesthesia with Moderate (Conscious) Sedation Local Anesthetic: Lidocaine 1% Route: Intravenous (IV) IV Access: Secured Sedation: Meaningful verbal contact was maintained at all times during the procedure  Indication(s): Analgesia and Anxiety   Indications: 1. Chronic low back pain (Primary Source of Pain) (Bilateral) (R>L)   2. Lumbar facet syndrome (Bilateral) (R>L)   3. Lumbar facet joint osteoarthritis (Bilateral)   4. Chronic pain syndrome   5. Neuropathic pain   6. Neurogenic pain   7. Opioid-induced constipation (OIC)   8. History of Vasovagal response to spinal injections   9. History of allergy to radiographic contrast media   10. History of postoperative nausea and vomiting   11. Chronic bilateral low back pain with bilateral sciatica   12. Facet syndrome, lumbar   13. Osteoarthritis of facet joint of lumbar spine   14. Hypotension during surgery    Alan Schmidt has either failed to respond, was unable to tolerate, or simply did not get enough benefit from other more conservative therapies including, but not limited to: 1. Over-the-counter medications 2. Anti-inflammatory medications 3. Muscle relaxants 4. Membrane stabilizers 5. Opioids 6. Physical therapy 7. Modalities (Heat, ice,  etc.) 8. Invasive techniques such as nerve blocks. Alan Schmidt has attained more than 50% relief of the pain from a series of diagnostic injections conducted in separate occasions.  Pain Score: Pre-procedure: 5 /10 Post-procedure: 0-No pain/10  Pre-op Assessment:  Alan Schmidt is a 72 y.o. (year old), male patient, seen today for interventional treatment. He  has a past surgical history that includes Total hip arthroplasty; Tonsillectomy; and right hip surgery. Mr. Para has a current medication list which includes the following prescription(s): accu-chek compact plus, acetaminophen, alprazolam, aspirin ec, bd pen needle nano u/f, carvedilol, desoximetasone, doxycycline, fluoxetine, hydrochlorothiazide, hydroxyzine, insulin degludec, insulin lispro, loratadine, lubiprostone, metformin, mometasone, naloxone, olmesartan, oxycodone hcl, oxycodone hcl, oxycodone hcl, pravastatin, pregabalin, tamsulosin, and victoza, and the following Facility-Administered Medications: ephedrine, fentanyl, and midazolam. His primarily concern today is the Hip Pain (right)  Initial Vital Signs: There were no vitals taken for this visit. BMI: Estimated body mass index is 35.23 kg/m as calculated from the following:   Height as of this encounter: 6\' 1"  (1.854 m).   Weight as of this encounter: 267 lb (121.1 kg).  Risk Assessment: Allergies: Reviewed. He is allergic to contrast media [iodinated diagnostic agents]; iodine; and shellfish allergy.  Allergy Precautions: None required Coagulopathies: Reviewed. None identified.  Blood-thinner therapy: None at this time Active Infection(s): Reviewed. None identified. Alan Schmidt is afebrile  Site Confirmation: Alan Schmidt was asked to confirm the procedure and laterality before marking the site Procedure checklist: Completed Consent: Before the procedure and under the influence of no sedative(s), amnesic(s), or anxiolytics, the patient was informed of the treatment  options, risks and possible complications. To fulfill our ethical and legal obligations, as recommended by the American Medical Association's Code of  Ethics, I have informed the patient of my clinical impression; the nature and purpose of the treatment or procedure; the risks, benefits, and possible complications of the intervention; the alternatives, including doing nothing; the risk(s) and benefit(s) of the alternative treatment(s) or procedure(s); and the risk(s) and benefit(s) of doing nothing. The patient was provided information about the general risks and possible complications associated with the procedure. These may include, but are not limited to: failure to achieve desired goals, infection, bleeding, organ or nerve damage, allergic reactions, paralysis, and death. In addition, the patient was informed of those risks and complications associated to Spine-related procedures, such as failure to decrease pain; infection (i.e.: Meningitis, epidural or intraspinal abscess); bleeding (i.e.: epidural hematoma, subarachnoid hemorrhage, or any other type of intraspinal or peri-dural bleeding); organ or nerve damage (i.e.: Any type of peripheral nerve, nerve root, or spinal cord injury) with subsequent damage to sensory, motor, and/or autonomic systems, resulting in permanent pain, numbness, and/or weakness of one or several areas of the body; allergic reactions; (i.e.: anaphylactic reaction); and/or death. Furthermore, the patient was informed of those risks and complications associated with the medications. These include, but are not limited to: allergic reactions (i.e.: anaphylactic or anaphylactoid reaction(s)); adrenal axis suppression; blood sugar elevation that in diabetics may result in ketoacidosis or comma; water retention that in patients with history of congestive heart failure may result in shortness of breath, pulmonary edema, and decompensation with resultant heart failure; weight gain; swelling or  edema; medication-induced neural toxicity; particulate matter embolism and blood vessel occlusion with resultant organ, and/or nervous system infarction; and/or aseptic necrosis of one or more joints. Finally, the patient was informed that Medicine is not an exact science; therefore, there is also the possibility of unforeseen or unpredictable risks and/or possible complications that may result in a catastrophic outcome. The patient indicated having understood very clearly. We have given the patient no guarantees and we have made no promises. Enough time was given to the patient to ask questions, all of which were answered to the patient's satisfaction. Mr. Hickle has indicated that he wanted to continue with the procedure. Attestation: I, the ordering provider, attest that I have discussed with the patient the benefits, risks, side-effects, alternatives, likelihood of achieving goals, and potential problems during recovery for the procedure that I have provided informed consent. Date: 02/16/2017; Time: 7:58 AM  Pre-Procedure Preparation:  Monitoring: As per clinic protocol. Respiration, ETCO2, SpO2, BP, heart rate and rhythm monitor placed and checked for adequate function Safety Precautions: Patient was assessed for positional comfort and pressure points before starting the procedure. Time-out: I initiated and conducted the "Time-out" before starting the procedure, as per protocol. The patient was asked to participate by confirming the accuracy of the "Time Out" information. Verification of the correct person, site, and procedure were performed and confirmed by me, the nursing staff, and the patient. "Time-out" conducted as per Joint Commission's Universal Protocol (UP.01.01.01). "Time-out" Date & Time: 02/16/2017; 1219 hrs.  Description of Procedure Process:   Position: Prone Target Area: For Lumbar Facet blocks, the target is the groove formed by the junction of the transverse process and superior  articular process. For the L5 dorsal ramus, the target is the notch between superior articular process and sacral ala. For the S1 dorsal ramus, the target is the superior and lateral edge of the posterior S1 Sacral foramen. Approach: Paraspinal approach. Area Prepped: Entire Posterior Lumbosacral Region Prepping solution: Hibiclens (4.0% Chlorhexidine gluconate solution) Safety Precautions: Aspiration looking for blood  return was conducted prior to all injections. At no point did we inject any substances, as a needle was being advanced. No attempts were made at seeking any paresthesias. Safe injection practices and needle disposal techniques used. Medications properly checked for expiration dates. SDV (single dose vial) medications used. Description of the Procedure: Protocol guidelines were followed. The patient was placed in position over the procedure table. The target area was identified and the area prepped in the usual manner. The skin and muscle were infiltrated with local anesthetic. Appropriate amount of time allowed to pass for local anesthetics to take effect. Radiofrequency needles were introduced to the target area using fluoroscopic guidance. Using the NeuroTherm NT1100 Radiofrequency Generator, sensory stimulation using 50 Hz was used to locate & identify the nerve, making sure that the needle was positioned such that there was no sensory stimulation below 0.3 V or above 0.7 V. Stimulation using 2 Hz was used to evaluate the motor component. Care was taken not to lesion any nerves that demonstrated motor stimulation of the lower extremities at an output of less than 2.5 times that of the sensory threshold, or a maximum of 2.0 V. Once satisfactory placement of the needles was achieved, the numbing solution was slowly injected after negative aspiration. After waiting for at least 2 minutes, the ablation was performed at 80 degrees C for 60 seconds, using regular Radiofrequency settings. Once the  procedure was completed, the needles were then removed and the area cleansed, making sure to leave some of the prepping solution back to take advantage of its long term bactericidal properties. Intra-operative Compliance: Compliant  Illustration of the posterior view of the lumbar spine and the posterior neural structures. Laminae of L2 through S1 are labeled. DPRL5, dorsal primary ramus of L5; DPRS1, dorsal primary ramus of S1; DPR3, dorsal primary ramus of L3; FJ, facet (zygapophyseal) joint L3-L4; I, inferior articular process of L4; LB1, lateral branch of dorsal primary ramus of L1; IAB, inferior articular branches from L3 medial branch (supplies L4-L5 facet joint); IBP, intermediate branch plexus; MB3, medial branch of dorsal primary ramus of L3; NR3, third lumbar nerve root; S, superior articular process of L5; SAB, superior articular branches from L4 (supplies L4-5 facet joint also); TP3, transverse process of L3.  Vitals:   02/16/17 1305 02/16/17 1313 02/16/17 1322 02/16/17 1333  BP: (!) 61/46 94/64 94/64  104/67  Pulse:      Resp: 17 12 14 17   Temp:  (!) 96.7 F (35.9 C)  (!) 96.4 F (35.8 C)  TempSrc:      SpO2: 99% 94% 98% 94%  Weight:      Height:        Start Time: 1220 hrs. End Time: 1302 hrs. Materials & Medications:  Needle(s) Type: Teflon-coated, curved tip, Radiofrequency needle(s) Gauge: 22G Length: 10cm Medication(s): We administered midazolam, fentaNYL, lactated ringers, lidocaine, ropivacaine (PF) 2 mg/mL (0.2%), triamcinolone acetonide, glycopyrrolate, and ondansetron. Please see chart orders for dosing details.  Imaging Guidance (Spinal):  Type of Imaging Technique: Fluoroscopy Guidance (Spinal) Indication(s): Assistance in needle guidance and placement for procedures requiring needle placement in or near specific anatomical locations not easily accessible without such assistance. Exposure Time: Please see nurses notes. Contrast: None used. Fluoroscopic Guidance:  I was personally present during the use of fluoroscopy. "Tunnel Vision Technique" used to obtain the best possible view of the target area. Parallax error corrected before commencing the procedure. "Direction-depth-direction" technique used to introduce the needle under continuous pulsed fluoroscopy. Once target was reached,  antero-posterior, oblique, and lateral fluoroscopic projection used confirm needle placement in all planes. Images permanently stored in EMR. Interpretation: No contrast injected. I personally interpreted the imaging intraoperatively. Adequate needle placement confirmed in multiple planes. Permanent images saved into the patient's record.  Antibiotic Prophylaxis:  Indication(s): None identified Antibiotic given: None  Post-operative Assessment:  EBL: None Complications: No immediate post-treatment complications observed by team, or reported by patient. Note: The patient tolerated the entire procedure well. A repeat set of vitals were taken after the procedure and the patient was kept under observation following institutional policy, for this type of procedure. Post-procedural neurological assessment was performed, showing return to baseline, prior to discharge. The patient was provided with post-procedure discharge instructions, including a section on how to identify potential problems. Should any problems arise concerning this procedure, the patient was given instructions to immediately contact us, at any time, without hesitation. In any case, we plan to contact the patient by telephone for a follow-up status report regarding this interventional procedure. Comments:  No additional relevant information.  Plan of Care    Imaging Orders     DG C-Arm 1-60 Min-No Report  Procedure Orders     Radiofrequency,Lumbar  Medications ordered for procedure: Meds ordered this encounter  Medications  . Oxycodone HCl 10 MG TABS    Sig: Take 1 tablet (10 mg total) by mouth every 6 (six)  hours as needed.    Dispense:  120 tablet    Refill:  0    Do not place this medication, or any other prescription from our practice, on "Automatic Refill". Patient may have prescription filled one day early if pharmacy is closed on scheduled refill date. Do not fill until: 04/24/17 To last until: 05/24/17  . Oxycodone HCl 10 MG TABS    Sig: Take 1 tablet (10 mg total) by mouth every 6 (six) hours as needed.    Dispense:  120 tablet    Refill:  0    Do not place this medication, or any other prescription from our practice, on "Automatic Refill". Patient may have prescription filled one day early if pharmacy is closed on scheduled refill date. Do not fill until: 03/25/17 To last until: 04/24/17  . Oxycodone HCl 10 MG TABS    Sig: Take 1 tablet (10 mg total) by mouth every 6 (six) hours as needed.    Dispense:  120 tablet    Refill:  0    Do not place this medication, or any other prescription from our practice, on "Automatic Refill". Patient may have prescription filled one day early if pharmacy is closed on scheduled refill date. Do not fill until: 02/23/17 To last until: 03/25/17  . pregabalin (LYRICA) 75 MG capsule    Sig: Take 1 capsule (75 mg total) by mouth every 12 (twelve) hours.    Dispense:  180 capsule    Refill:  0    Do not place this medication, or any other prescription from our practice, on "Automatic Refill". Patient may have prescription filled one day early if pharmacy is closed on scheduled refill date.  . lubiprostone (AMITIZA) 24 MCG capsule    Sig: Take 1 capsule (24 mcg total) by mouth 2 (two) times daily with a meal. Swallow the medication whole. Do not break or chew the medication.    Dispense:  180 capsule    Refill:  0    Do not place this medication, or any other prescription from our practice, on "Automatic Refill". Patient may  have prescription filled one day early if pharmacy is closed on scheduled refill date.  . midazolam (VERSED) 5 MG/5ML injection 1-2  mg    Make sure Flumazenil is available in the pyxis when using this medication. If oversedation occurs, administer 0.2 mg IV over 15 sec. If after 45 sec no response, administer 0.2 mg again over 1 min; may repeat at 1 min intervals; not to exceed 4 doses (1 mg)  . fentaNYL (SUBLIMAZE) injection 25-50 mcg    Make sure Narcan is available in the pyxis when using this medication. In the event of respiratory depression (RR< 8/min): Titrate NARCAN (naloxone) in increments of 0.1 to 0.2 mg IV at 2-3 minute intervals, until desired degree of reversal.  . lactated ringers infusion 1,000 mL  . lidocaine (XYLOCAINE) 2 % (with pres) injection 200 mg  . ropivacaine (PF) 2 mg/mL (0.2%) (NAROPIN) injection 9 mL  . triamcinolone acetonide (KENALOG-40) injection 40 mg  . glycopyrrolate (ROBINUL) injection 0.2 mg  . ondansetron (ZOFRAN) injection 4 mg  . ePHEDrine injection 5-50 mg   Medications administered: We administered midazolam, fentaNYL, lactated ringers, lidocaine, ropivacaine (PF) 2 mg/mL (0.2%), triamcinolone acetonide, glycopyrrolate, and ondansetron.  See the medical record for exact dosing, route, and time of administration.  New Prescriptions   No medications on file   Disposition: Discharge home  Discharge Date & Time: 02/16/2017; 1336 hrs.   Physician-requested Follow-up: Return for Post-RFA eval in 6 wks, w/ Dionisio David, NP.   Future Appointments  Date Time Provider [place]  02/23/2017  1:30 PM Vevelyn Francois, NP [facility] None  03/16/2017 11:00 AM [name], MD [facility]  03/30/2017 11:15 AM Vevelyn Francois, NP [facility] None   Primary Care Physician: [name], MD Location: [facility] Facility Note by: Gaspar Cola, MD Date: 02/16/2017; Time: 1:59 PM  Disclaimer:  Medicine is not an Chief Strategy Officer. The only guarantee in medicine is that nothing is guaranteed. It is important to note that the decision to proceed with  this intervention was based on the information collected from the patient. The Data and conclusions were drawn from the patient's questionnaire, the interview, and the physical examination. Because the information was provided in large part by the patient, it cannot be guaranteed that it has not been purposely or unconsciously manipulated. Every effort has been made to obtain as much relevant data as possible for this evaluation. It is important to note that the conclusions that lead to this procedure are derived in large part from the available data. Always take into account that the treatment will also be dependent on availability of resources and existing treatment guidelines, considered by other Pain Management Practitioners as being common knowledge and practice, at the time of the intervention. For Medico-Legal purposes, it is also important to point out that variation in procedural techniques and pharmacological choices are the acceptable norm. The indications, contraindications, technique, and results of the above procedure should only be interpreted and judged by a Board-Certified Interventional Pain Specialist with extensive familiarity and expertise in the same exact procedure and technique.

## 2017-02-17 ENCOUNTER — Telehealth: Payer: Self-pay | Admitting: *Deleted

## 2017-02-17 NOTE — Telephone Encounter (Signed)
faxed

## 2017-02-17 NOTE — Telephone Encounter (Signed)
LVM

## 2017-02-23 ENCOUNTER — Other Ambulatory Visit: Payer: Self-pay

## 2017-02-23 ENCOUNTER — Encounter: Payer: Self-pay | Admitting: Nurse Practitioner

## 2017-02-23 ENCOUNTER — Ambulatory Visit: Payer: Medicare Other | Attending: Nurse Practitioner | Admitting: Nurse Practitioner

## 2017-02-23 VITALS — BP 147/82 | HR 90 | Temp 98.1°F | Resp 16 | Ht 73.0 in | Wt 250.0 lb

## 2017-02-23 DIAGNOSIS — T402X5A Adverse effect of other opioids, initial encounter: Secondary | ICD-10-CM

## 2017-02-23 DIAGNOSIS — M25551 Pain in right hip: Secondary | ICD-10-CM

## 2017-02-23 DIAGNOSIS — G894 Chronic pain syndrome: Secondary | ICD-10-CM

## 2017-02-23 DIAGNOSIS — M47816 Spondylosis without myelopathy or radiculopathy, lumbar region: Secondary | ICD-10-CM

## 2017-02-23 DIAGNOSIS — M5416 Radiculopathy, lumbar region: Secondary | ICD-10-CM

## 2017-02-23 DIAGNOSIS — M792 Neuralgia and neuritis, unspecified: Secondary | ICD-10-CM

## 2017-02-23 DIAGNOSIS — G8929 Other chronic pain: Secondary | ICD-10-CM

## 2017-02-23 DIAGNOSIS — K5903 Drug induced constipation: Secondary | ICD-10-CM

## 2017-02-23 MED ORDER — OXYCODONE HCL 10 MG PO TABS: 10.0000 mg | ORAL_TABLET | Freq: Four times a day (QID) | ORAL | 0 refills | Status: DC | PRN

## 2017-02-23 MED ORDER — LUBIPROSTONE 24 MCG PO CAPS: 24.0000 ug | ORAL_CAPSULE | Freq: Two times a day (BID) | ORAL | 0 refills | Status: DC

## 2017-02-23 MED ORDER — PREGABALIN 75 MG PO CAPS: 75.0000 mg | ORAL_CAPSULE | Freq: Two times a day (BID) | ORAL | 0 refills | Status: DC

## 2017-02-23 NOTE — Progress Notes (Signed)
Patient's Name: Alan Schmidt  MRN: [id number]  Referring Provider: [name], MD  DOB: 12-28-1945  PCP: [name], MD  DOS: 02/23/2017  Note by: Vevelyn Francois NP  Service setting: Ambulatory outpatient  Specialty: Interventional Pain Management  Location: [facility] (AMB) Pain Management Facility    Patient type: Established    Primary Reason(s) for Visit: Encounter for prescription drug management. (Level of risk: moderate)  CC: Back Pain (lower) and Hip Pain (right)  HPI  Alan Schmidt is a 72 y.o. year old, male patient, who comes today for a medication management evaluation. He has Paraparesis ([place]); Chronic low back pain (Primary Source of Pain) (Bilateral) (R>L); Lumbar facet syndrome (Bilateral) (R>L); Lumbar spondylosis; Diabetic peripheral neuropathy ([place]); Long term current use of opiate analgesic; Long term prescription opiate use; Opiate use (60 MME/Day); Opiate dependence ([place]); Encounter for therapeutic drug level monitoring; Chronic neck pain (midline over the C7 spinous processes) (L>R); Neurogenic pain; Neuropathic pain; Contrast dye allergy; Chronic lower extremity pain (Secondary source of pain) (Bilateral) (R>L); 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) (Right L5 dermatome); History of total hip replacement (Right); Chronic hip pain (Right); Class I Morbid obesity (HCC) (68% higher incidence of chronic low back pain); Essential hypertension; GERD (gastroesophageal reflux disease); Obstructive sleep apnea; Hyperlipidemia; Chronic kidney disease (CKD); Abnormal nerve conduction studies (severe bilateral lower extremity polyneuropathy); Chronic shoulder pain (Bilateral) (R>L); Occipital neuralgia (Left); Cervicogenic headache (Left); Diabetes ([place]); BPH (benign prostatic hyperplasia); Right ear pain; History of Vasovagal response to spinal injections; Allergic rhinitis; Anxiety and depression; Disturbance of skin sensation; Prostate cancer screening; Chronic shoulder  radicular pain (Left); Chronic cervical radicular pain (Left); Chronic pain syndrome; Cerumen impaction; Opioid-induced constipation (OIC); Encounter for general adult medical examination with abnormal findings; History of postoperative nausea and vomiting; Acute postoperative pain; Ear pain, left; Nevus; Boil; Lumbar facet joint osteoarthritis (Bilateral); History of allergy to radiographic contrast media; and Hypotension during surgery on their problem list. His primarily concern today is the Back Pain (lower) and Hip Pain (right)  Pain Assessment: Location: Lower, Medial Back Duration: Chronic pain Quality: Constant, Dull Severity: 4 /10 (self-reported pain score)  Note: Reported level is compatible with observation.                          Effect on ADL: unable to bend over Modifying factors: denies  Alan Schmidt was last scheduled for an appointment on 11/12/2016 for medication management. During today's appointment we reviewed Alan Schmidt's chronic pain status, as well as his outpatient medication regimen. He states that he had a big fall. He states it was tough but he has done well. He admits that he was seen by ortho to make sure that he had not injured his right hip. He admits that the fall had a psychological component. He admits that he is doing well now.   The patient  reports that he does not use drugs. His body mass index is 32.98 kg/m.  Further details on both, my assessment(s), as well as the proposed treatment plan, please see below.  Controlled Substance Pharmacotherapy Assessment REMS (Risk Evaluation and Mitigation Strategy)  Analgesic:Oxycodone IR 10 mg every 6 hours (40 mg/day) MME/day:60 mg/day.    Rise Patience  02/23/2017  2:19 PM  Signed Nursing Pain Medication Assessment:  Safety precautions to be maintained throughout the outpatient stay will include: orient to surroundings, keep bed in low position, maintain call bell within reach at all times, provide  assistance with transfer out of bed and ambulation.  Medication Inspection Compliance: Pill count conducted under aseptic conditions, in front of the patient. Neither the pills nor the bottle was removed from the patient's sight at any time. Once count was completed pills were immediately returned to the patient in their original bottle.  Medication: Oxycodone HCL Pill/Patch Count: 76 of 120 pills remain Pill/Patch Appearance: Markings consistent with prescribed medication Bottle Appearance: Standard pharmacy container. Clearly labeled. Filled Date: 01 / 05 / 2019 Last Medication intake:  Today   Pharmacokinetics: Liberation and absorption (onset of action): WNL Distribution (time to peak effect): WNL Metabolism and excretion (duration of action): WNL         Pharmacodynamics: Desired effects: Analgesia: Alan Schmidt reports >50% benefit. Functional ability: Patient reports that medication allows him to accomplish basic ADLs Clinically meaningful improvement in function (CMIF): Sustained CMIF goals met Perceived effectiveness: Described as relatively effective, allowing for increase in activities of daily living (ADL) Undesirable effects: Side-effects or Adverse reactions: None reported Monitoring: [place] PMP: Online review of the past 24-monthperiod conducted. Compliant with practice rules and regulations Last UDS on record: Summary  Date Value Ref Range Status  08/13/2016 FINAL  Final    Comment:    ==================================================================== TOXASSURE SELECT 13 (MW) ==================================================================== Test                             Result       Flag       Units Drug Present and Declared for Prescription Verification   Alprazolam                     32           EXPECTED   ng/mg creat   Alpha-hydroxyalprazolam        99           EXPECTED   ng/mg creat    Source of alprazolam is a scheduled prescription medication.     Alpha-hydroxyalprazolam is an expected metabolite of alprazolam.   Oxycodone                      664          EXPECTED   ng/mg creat   Oxymorphone                    74           EXPECTED   ng/mg creat   Noroxycodone                   1690         EXPECTED   ng/mg creat    Sources of oxycodone include scheduled prescription medications.    Oxymorphone and noroxycodone are expected metabolites of    oxycodone. Oxymorphone is also available as a scheduled    prescription medication. ==================================================================== Test                      Result    Flag   Units      Ref Range   Creatinine              155              mg/dL      >=20 ==================================================================== Declared Medications:  The flagging and interpretation on this report are based on the  following declared  medications.  Unexpected results may arise from  inaccuracies in the declared medications.  **Note: The testing scope of this panel includes these medications:  Alprazolam  Oxycodone  **Note: The testing scope of this panel does not include following  reported medications:  Aspirin  Carvedilol  Cholecalciferol  Desoximetasone  Fluoxetine  Hydrochlorothiazide  Hydroxyzine  Insulin  Liraglutide  Lubiprostone  Magnesium Oxide  Metformin  Mometasone  Naloxone  Pravastatin  Pregabalin  Tamsulosin  Valsartan ==================================================================== For clinical consultation, please call [phone]. ====================================================================    UDS interpretation: Compliant          Medication Assessment Form: Reviewed. Patient indicates being compliant with therapy Treatment compliance: Compliant Risk Assessment Profile: Aberrant behavior: See prior evaluations. None observed or detected today Comorbid factors increasing risk of overdose: See prior notes. No additional risks  detected today Risk of substance use disorder (SUD): Low Opioid Risk Tool - 02/23/17 1406      Family History of Substance Abuse   Alcohol  Negative    Illegal Drugs  Negative    Rx Drugs  Negative      Personal History of Substance Abuse   Alcohol  Negative    Illegal Drugs  Negative    Rx Drugs  Negative      Age   Age between 49-45 years   No      History of Preadolescent Sexual Abuse   History of Preadolescent Sexual Abuse  Negative or Male      Psychological Disease   Psychological Disease  Negative    Depression  Negative      Total Score   Opioid Risk Tool Scoring  0    Opioid Risk Interpretation  Low Risk      ORT Scoring interpretation table:  Score <3 = Low Risk for SUD  Score between 4-7 = Moderate Risk for SUD  Score >8 = High Risk for Opioid Abuse   Risk Mitigation Strategies:  Patient Counseling: Covered Patient-Prescriber Agreement (PPA): Present and active  Notification to other healthcare providers: Done  Pharmacologic Plan: No change in therapy, at this time.             Laboratory Chemistry  Inflammation Markers (CRP: Acute Phase) (ESR: Chronic Phase) Lab Results  Component Value Date   CRP 0.5 03/06/2015   ESRSEDRATE 45 (H) 12/14/2016                 Rheumatology Markers No results found for: RF, ANA, Therisa Doyne, [facility]              Renal Function Markers Lab Results  Component Value Date   BUN 13 12/14/2016   CREATININE 0.89 12/14/2016   GFRAA >60 10/25/2016   GFRNONAA >60 10/25/2016                 Hepatic Function Markers Lab Results  Component Value Date   AST 25 09/25/2016   ALT 27 09/25/2016   ALBUMIN 3.9 09/25/2016   ALKPHOS 67 09/25/2016                 Electrolytes Lab Results  Component Value Date   NA 136 12/14/2016   K 4.3 12/14/2016   CL 100 12/14/2016   CALCIUM 9.1 12/14/2016   MG 1.8 03/06/2015                 Neuropathy Markers Lab Results  Component Value Date   HGBA1C 8.2  (H) 12/14/2016  Bone Pathology Markers No results found for: VD25OH, H139778, DJ2426ST4, HD6222LN9, 25OHVITD1, 25OHVITD2, 25OHVITD3, TESTOFREE, TESTOSTERONE               Coagulation Parameters Lab Results  Component Value Date   PLT 278 10/25/2016                 Cardiovascular Markers Lab Results  Component Value Date   HGB 13.8 10/25/2016   HCT 41.5 10/25/2016                 CA Markers No results found for: CEA, CA125, LABCA2               Note: Lab results reviewed.  Recent Diagnostic Imaging Results  DG C-Arm 1-60 Min-No Report Fluoroscopy was utilized by the requesting physician.  No radiographic  interpretation.   Complexity Note: Imaging results reviewed. Results shared with Mr. Grandville Silos, using Layman's terms.                         Meds   Current Outpatient Medications:  .  ACCU-CHEK COMPACT PLUS test strip, USE 1 STRIP SEVEN TIMES A DAY, Disp: 204 each, Rfl: 6 .  acetaminophen (TYLENOL) 500 MG tablet, Take 500 mg by mouth every 6 (six) hours as needed., Disp: , Rfl:  .  ALPRAZolam (XANAX) 0.5 MG tablet, Take 1 tablet (0.5 mg total) by mouth 2 (two) times daily as needed., Disp: 60 tablet, Rfl: 2 .  aspirin EC 325 MG tablet, Take 325 mg by mouth daily. , Disp: , Rfl:  .  BD PEN NEEDLE NANO U/F 32G X 4 MM MISC, USE EVERY DAY, Disp: 100 each, Rfl: 4 .  blood glucose meter kit and supplies KIT, Dispense based on patient and insurance preference.  Check CBGs 4 times daily.  ICD 10 code E11.9., Disp: 1 each, Rfl: 0 .  carvedilol (COREG) 25 MG tablet, TAKE 1 TABLET BY MOUTH TWICE A DAY, Disp: 180 tablet, Rfl: 1 .  desoximetasone (TOPICORT) 0.25 % cream, APPLY CREAM TO AFFECTED AREA TWO TIMES DAILY, Disp: , Rfl: 3 .  FLUoxetine (PROZAC) 40 MG capsule, TAKE ONE CAPSULE BY MOUTH TWO TIMES DAILY, Disp: 180 capsule, Rfl: 1 .  hydrochlorothiazide (HYDRODIURIL) 25 MG tablet, TAKE 1 TABLET BY MOUTH EVERY DAY, Disp: 90 tablet, Rfl: 1 .  hydrOXYzine  (ATARAX/VISTARIL) 50 MG tablet, TAKE 1 TABLET BY MOUTH FOUR TIMES A DAY AS NEEDED, Disp: 90 tablet, Rfl: 1 .  insulin degludec (TRESIBA FLEXTOUCH) 100 UNIT/ML SOPN FlexTouch Pen, Inject 0.4 mLs (40 Units total) into the skin daily at 10 pm., Disp: 5 pen, Rfl: 3 .  insulin lispro (HUMALOG KWIKPEN) 100 UNIT/ML KiwkPen, Inject 0.1 mLs (10 Units total) into the skin 3 (three) times daily with meals., Disp: 15 mL, Rfl: 3 .  loratadine (CLARITIN) 10 MG tablet, Take 1 tablet (10 mg total) by mouth daily., Disp: 30 tablet, Rfl: 11 .  metFORMIN (GLUCOPHAGE) 1000 MG tablet, TAKE 1 TABLET (1,000 MG TOTAL) BY MOUTH 2 (TWO) TIMES DAILY WITH A MEAL., Disp: 180 tablet, Rfl: 2 .  mometasone (NASONEX) 50 MCG/ACT nasal spray, Place 2 sprays into the nose daily., Disp: 17 g, Rfl: 12 .  naloxone (NARCAN) 2 MG/2ML injection, Inject content of syringe into thigh muscle. Call 911., Disp: 2 Syringe, Rfl: 1 .  olmesartan (BENICAR) 40 MG tablet, Take 1 tablet (40 mg total) by mouth daily., Disp: 90 tablet, Rfl: 1 .  pravastatin (PRAVACHOL)  40 MG tablet, TAKE 1 TABLET BY MOUTH EVERY DAY, Disp: 90 tablet, Rfl: 1 .  tamsulosin (FLOMAX) 0.4 MG CAPS capsule, TAKE 1 CAPSULE (0.4 MG TOTAL) BY MOUTH 2 (TWO) TIMES DAILY., Disp: 180 capsule, Rfl: 2 .  VICTOZA 18 MG/3ML SOPN, INJECT 0.3 MLS (1.8 MG TOTAL) INTO THE SKIN DAILY., Disp: 27 pen, Rfl: 1  ROS  Constitutional: Denies any fever or chills Gastrointestinal: No reported hemesis, hematochezia, vomiting, or acute GI distress Musculoskeletal: Denies any acute onset joint swelling, redness, loss of ROM, or weakness Neurological: No reported episodes of acute onset apraxia, aphasia, dysarthria, agnosia, amnesia, paralysis, loss of coordination, or loss of consciousness  Allergies  Mr. Lekas is allergic to contrast media [iodinated diagnostic agents]; iodine; and shellfish allergy.  [place]  Drug: Mr. Viner  reports that he does not use drugs. Alcohol:  reports that he does not  drink alcohol. Tobacco:  reports that he has quit smoking. he has never used smokeless tobacco. Medical:  has a past medical history of Anxiety, Chronic hip pain (Right) (12/05/2014), Chronic lumbar pain, Depression, Hyperlipidemia, Hypertension, Kidney stones, and Migraines. Surgical: Mr. Whaling  has a past surgical history that includes Total hip arthroplasty; Tonsillectomy; and right hip surgery. Family: family history includes Cancer in his mother; Heart disease in his father; Stroke in his father.  Constitutional Exam  General appearance: Well nourished, well developed, and well hydrated. In no apparent acute distress Vitals:   02/23/17 1349  BP: (!) 147/82  Pulse: 90  Resp: 16  Temp: 98.1 F (36.7 C)  TempSrc: Oral  SpO2: 98%  Weight: 250 lb (113.4 kg)  Height: 6' 1"  (1.854 m)   BMI Assessment: Estimated body mass index is 32.98 kg/m as calculated from the following:   Height as of this encounter: 6' 1"  (1.854 m).   Weight as of this encounter: 250 lb (113.4 kg). Analgesic:Oxycodone IR 10 mg every 6 hours (40 mg/day) MME/day:60 mg/day.   Psych/Mental status: Alert, oriented x 3 (person, place, & time)       Eyes: PERLA Respiratory: No evidence of acute respiratory distress  Cervical Spine Area Exam  Skin & Axial Inspection: No masses, redness, edema, swelling, or associated skin lesions Alignment: Symmetrical Functional ROM: Unrestricted ROM      Stability: No instability detected Muscle Tone/Strength: Functionally intact. No obvious neuro-muscular anomalies detected. Sensory (Neurological): Unimpaired Palpation: No palpable anomalies              Upper Extremity (UE) Exam    Side: Right upper extremity  Side: Left upper extremity  Skin & Extremity Inspection: Skin color, temperature, and hair growth are WNL. No peripheral edema or cyanosis. No masses, redness, swelling, asymmetry, or associated skin lesions. No contractures.  Skin & Extremity Inspection: Skin  color, temperature, and hair growth are WNL. No peripheral edema or cyanosis. No masses, redness, swelling, asymmetry, or associated skin lesions. No contractures.  Functional ROM: Unrestricted ROM          Functional ROM: Unrestricted ROM          Muscle Tone/Strength: Functionally intact. No obvious neuro-muscular anomalies detected.  Muscle Tone/Strength: Functionally intact. No obvious neuro-muscular anomalies detected.  Sensory (Neurological): Unimpaired          Sensory (Neurological): Unimpaired          Palpation: No palpable anomalies              Palpation: No palpable anomalies  Specialized Test(s): Deferred         Specialized Test(s): Deferred          Thoracic Spine Area Exam  Skin & Axial Inspection: No masses, redness, or swelling Alignment: Symmetrical Functional ROM: Unrestricted ROM Stability: No instability detected Muscle Tone/Strength: Functionally intact. No obvious neuro-muscular anomalies detected. Sensory (Neurological): Unimpaired Muscle strength & Tone: No palpable anomalies  Lumbar Spine Area Exam  Skin & Axial Inspection: No masses, redness, or swelling Alignment: Symmetrical Functional ROM: Unrestricted ROM      Stability: No instability detected Muscle Tone/Strength: Functionally intact. No obvious neuro-muscular anomalies detected. Sensory (Neurological): Unimpaired Palpation: No palpable anomalies       Provocative Tests: Lumbar Hyperextension and rotation test: evaluation deferred today       Lumbar Lateral bending test: evaluation deferred today       Patrick's Maneuver: evaluation deferred today                    Gait & Posture Assessment  Ambulation: Unassisted Gait: Relatively normal for age and body habitus Posture: WNL   Lower Extremity Exam    Side: Right lower extremity  Side: Left lower extremity  Skin & Extremity Inspection: Skin color, temperature, and hair growth are WNL. No peripheral edema or cyanosis. No masses,  redness, swelling, asymmetry, or associated skin lesions. No contractures.  Skin & Extremity Inspection: Skin color, temperature, and hair growth are WNL. No peripheral edema or cyanosis. No masses, redness, swelling, asymmetry, or associated skin lesions. No contractures.  Functional ROM: Unrestricted ROM          Functional ROM: Unrestricted ROM          Muscle Tone/Strength: Functionally intact. No obvious neuro-muscular anomalies detected.  Muscle Tone/Strength: Functionally intact. No obvious neuro-muscular anomalies detected.  Sensory (Neurological): Unimpaired  Sensory (Neurological): Unimpaired  Palpation: No palpable anomalies  Palpation: No palpable anomalies   Assessment  Primary Diagnosis & Pertinent Problem List: The primary encounter diagnosis was 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) (Right L5 dermatome). Diagnoses of Lumbar facet syndrome (Bilateral) (R>L), Chronic hip pain (Right), Chronic pain syndrome, Neuropathic pain, Neurogenic pain, and Opioid-induced constipation (OIC) were also pertinent to this visit.  Status Diagnosis  Controlled Controlled Controlled 1. 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) (Right L5 dermatome)   2. Lumbar facet syndrome (Bilateral) (R>L)   3. Chronic hip pain (Right)   4. Chronic pain syndrome   5. Neuropathic pain   6. Neurogenic pain   7. Opioid-induced constipation (OIC)     Problems updated and reviewed during this visit: No problems updated. Plan of Care  Pharmacotherapy (Medications Ordered): Meds ordered this encounter  Medications  . DISCONTD: Oxycodone HCl 10 MG TABS    Sig: Take 1 tablet (10 mg total) by mouth every 6 (six) hours as needed.    Dispense:  120 tablet    Refill:  0    Do not place this medication, or any other prescription from our practice, on "Automatic Refill". Patient may have prescription filled one day early if pharmacy is closed on scheduled refill date. Do not fill until: 05/07/2017 To  last until: 06/06/2017    Order Specific Question:   Supervising Provider    Answer:   Milinda Pointer [phone]  . DISCONTD: Oxycodone HCl 10 MG TABS    Sig: Take 1 tablet (10 mg total) by mouth every 6 (six) hours as needed.    Dispense:  120 tablet    Refill:  0    Do not place this medication, or any other prescription from our practice, on "Automatic Refill". Patient may have prescription filled one day early if pharmacy is closed on scheduled refill date. Do not fill until:04/07/2017 To last until: 05/07/2017    Order Specific Question:   Supervising Provider    Answer:   Milinda Pointer [phone]  . DISCONTD: Oxycodone HCl 10 MG TABS    Sig: Take 1 tablet (10 mg total) by mouth every 6 (six) hours as needed.    Dispense:  120 tablet    Refill:  0    Do not place this medication, or any other prescription from our practice, on "Automatic Refill". Patient may have prescription filled one day early if pharmacy is closed on scheduled refill date. Do not fill until: 03/08/2017 To last until: 04/07/2017    Order Specific Question:   Supervising Provider    Answer:   Milinda Pointer [phone]  . DISCONTD: pregabalin (LYRICA) 75 MG capsule    Sig: Take 1 capsule (75 mg total) by mouth every 12 (twelve) hours.    Dispense:  180 capsule    Refill:  0    Do not place this medication, or any other prescription from our practice, on "Automatic Refill". Patient may have prescription filled one day early if pharmacy is closed on scheduled refill date.    Order Specific Question:   Supervising Provider    Answer:   Milinda Pointer [phone]  . DISCONTD: lubiprostone (AMITIZA) 24 MCG capsule    Sig: Take 1 capsule (24 mcg total) by mouth 2 (two) times daily with a meal. Swallow the medication whole. Do not break or chew the medication.    Dispense:  180 capsule    Refill:  0    Do not place this medication, or any other prescription from our practice, on "Automatic Refill". Patient may have  prescription filled one day early if pharmacy is closed on scheduled refill date.    Order Specific Question:   Supervising Provider    Answer:   Milinda Pointer [[id number]]   New Prescriptions   No medications on file   Medications administered today: Karmen Stabs. Elmore had no medications administered during this visit. Lab-work, procedure(s), and/or referral(s): No orders of the defined types were placed in this encounter.  Imaging and/or referral(s): None  Interventional therapies: Planned, scheduled, and/or pending:  Not at this time. Duplicate Appointment not needed today follow up in one month    Considering:  Palliative bilateral lumbar facet RFA (Right done on 02/17/16 & 04/30/15) Diagnostic Left GONB Possible Left GON RFA Palliative Left CESI   Palliative PRN treatment(s):  Diagnostic Left GONB    Provider-requested follow-up: Return in about 3 months (around 05/24/2017) for MedMgmt with Me Dionisio David).  Future Appointments  Date Time Provider [place]  03/16/2017 11:00 AM [name], MD [facility]  03/30/2017 11:15 AM Vevelyn Francois, NP [facility] None   Primary Care Physician: [name], MD Location: [facility] Facility Note by: Vevelyn Francois NP Date: 02/23/2017; Time: 4:07 PM  Pain Score Disclaimer: We use the NRS-11 scale. This is a self-reported, subjective measurement of pain severity with only modest accuracy. It is used primarily to identify changes within a particular patient. It must be understood that outpatient pain scales are significantly less accurate that those used for research, where they can be applied under ideal controlled circumstances with minimal exposure to variables. In reality, the  score is likely to be a combination of pain intensity and pain affect, where pain affect describes the degree of emotional arousal or changes in action readiness caused by the sensory experience of pain.  Factors such as social and work situation, setting, emotional state, anxiety levels, expectation, and prior pain experience may influence pain perception and show large inter-individual differences that may also be affected by time variables.  Patient instructions provided during this appointment: Patient Instructions   ____________________________________________________________________________________________  Medication Rules  Applies to: All patients receiving prescriptions (written or electronic).  Pharmacy of record: Pharmacy where electronic prescriptions will be sent. If written prescriptions are taken to a different pharmacy, please inform the nursing staff. The pharmacy listed in the electronic medical record should be the one where you would like electronic prescriptions to be sent.  Prescription refills: Only during scheduled appointments. Applies to both, written and electronic prescriptions.  NOTE: The following applies primarily to controlled substances (Opioid* Pain Medications).   Patient's responsibilities: 1. Pain Pills: Bring all pain pills to every appointment (except for procedure appointments). 2. Pill Bottles: Bring pills in original pharmacy bottle. Always bring newest bottle. Bring bottle, even if empty. 3. Medication refills: You are responsible for knowing and keeping track of what medications you need refilled. The day before your appointment, write a list of all prescriptions that need to be refilled. Bring that list to your appointment and give it to the admitting nurse. Prescriptions will be written only during appointments. If you forget a medication, it will not be "Called in", "Faxed", or "electronically sent". You will need to get another appointment to get these prescribed. 4. Prescription Accuracy: You are responsible for carefully inspecting your prescriptions before leaving our office. Have the discharge nurse carefully go over each prescription with you,  before taking them home. Make sure that your name is accurately spelled, that your address is correct. Check the name and dose of your medication to make sure it is accurate. Check the number of pills, and the written instructions to make sure they are clear and accurate. Make sure that you are given enough medication to last until your next medication refill appointment. 5. Taking Medication: Take medication as prescribed. Never take more pills than instructed. Never take medication more frequently than prescribed. Taking less pills or less frequently is permitted and encouraged, when it comes to controlled substances (written prescriptions).  6. Inform other Doctors: Always inform, all of your healthcare providers, of all the medications you take. 7. Pain Medication from other Providers: You are not allowed to accept any additional pain medication from any other Doctor or Healthcare provider. There are two exceptions to this rule. (see below) In the event that you require additional pain medication, you are responsible for notifying us, as stated below. 8. Medication Agreement: You are responsible for carefully reading and following our Medication Agreement. This must be signed before receiving any prescriptions from our practice. Safely store a copy of your signed Agreement. Violations to the Agreement will result in no further prescriptions. (Additional copies of our Medication Agreement are available upon request.) 9. Laws, Rules, & Regulations: All patients are expected to follow all Federal and [employer], [employer], Rules, [employer]. Ignorance of the Laws does not constitute a valid excuse. The use of any illegal substances is prohibited. 10. Adopted CDC guidelines & recommendations: Target dosing levels will be at or below 60 MME/day. Use of benzodiazepines** is not recommended.  Exceptions: There are only two exceptions to the  rule of not receiving pain medications from other Healthcare  Providers. 1. Exception #1 (Emergencies): In the event of an emergency (i.e.: accident requiring emergency care), you are allowed to receive additional pain medication. However, you are responsible for: As soon as you are able, call our office [phone], at any time of the day or night, and leave a message stating your name, the date and nature of the emergency, and the name and dose of the medication prescribed. In the event that your call is answered by a member of our staff, make sure to document and save the date, time, and the name of the person that took your information.  2. Exception #2 (Planned Surgery): In the event that you are scheduled by another doctor or dentist to have any type of surgery or procedure, you are allowed (for a period no longer than 30 days), to receive additional pain medication, for the acute post-op pain. However, in this case, you are responsible for picking up a copy of our "Post-op Pain Management for Surgeons" handout, and giving it to your surgeon or dentist. This document is available at our office, and does not require an appointment to obtain it. Simply go to our office during business hours (Monday-Thursday from 8:00 AM to 4:00 PM) (Friday 8:00 AM to 12:00 Noon) or if you have a scheduled appointment with [place], prior to your surgery, and ask for it by name. In addition, you will need to provide [place] with your name, name of your surgeon, type of surgery, and date of procedure or surgery.  *Opioid medications include: morphine, codeine, oxycodone, oxymorphone, hydrocodone, hydromorphone, meperidine, tramadol, tapentadol, buprenorphine, fentanyl, methadone. **Benzodiazepine medications include: diazepam (Valium), alprazolam (Xanax), clonazepam (Klonopine), lorazepam (Ativan), clorazepate (Tranxene), chlordiazepoxide (Librium), estazolam (Prosom), oxazepam (Serax), temazepam (Restoril), triazolam  (Halcion)  ____________________________________________________________________________________________

## 2017-02-23 NOTE — Progress Notes (Signed)
Nursing Pain Medication Assessment:  Safety precautions to be maintained throughout the outpatient stay will include: orient to surroundings, keep bed in low position, maintain call bell within reach at all times, provide assistance with transfer out of bed and ambulation.  Medication Inspection Compliance: Pill count conducted under aseptic conditions, in front of the patient. Neither the pills nor the bottle was removed from the patient's sight at any time. Once count was completed pills were immediately returned to the patient in their original bottle.  Medication: Oxycodone HCL Pill/Patch Count: 76 of 120 pills remain Pill/Patch Appearance: Markings consistent with prescribed medication Bottle Appearance: Standard pharmacy container. Clearly labeled. Filled Date: 01 / 05 / 2019 Last Medication intake:  Today

## 2017-02-23 NOTE — Patient Instructions (Signed)

## 2017-02-24 DIAGNOSIS — R42 Dizziness and giddiness: Secondary | ICD-10-CM | POA: Diagnosis not present

## 2017-02-24 DIAGNOSIS — G44221 Chronic tension-type headache, intractable: Secondary | ICD-10-CM | POA: Insufficient documentation

## 2017-03-13 ENCOUNTER — Other Ambulatory Visit: Payer: Self-pay | Admitting: Family Medicine

## 2017-03-14 ENCOUNTER — Other Ambulatory Visit: Payer: Self-pay | Admitting: Family Medicine

## 2017-03-14 DIAGNOSIS — E1142 Type 2 diabetes mellitus with diabetic polyneuropathy: Secondary | ICD-10-CM

## 2017-03-14 DIAGNOSIS — Z794 Long term (current) use of insulin: Principal | ICD-10-CM

## 2017-03-15 DIAGNOSIS — L851 Acquired keratosis [keratoderma] palmaris et plantaris: Secondary | ICD-10-CM | POA: Diagnosis not present

## 2017-03-15 DIAGNOSIS — E1140 Type 2 diabetes mellitus with diabetic neuropathy, unspecified: Secondary | ICD-10-CM | POA: Diagnosis not present

## 2017-03-15 DIAGNOSIS — B351 Tinea unguium: Secondary | ICD-10-CM | POA: Diagnosis not present

## 2017-03-16 ENCOUNTER — Ambulatory Visit (INDEPENDENT_AMBULATORY_CARE_PROVIDER_SITE_OTHER): Payer: Medicare Other | Admitting: Family Medicine

## 2017-03-16 ENCOUNTER — Other Ambulatory Visit: Payer: Self-pay | Admitting: Family Medicine

## 2017-03-16 ENCOUNTER — Encounter: Payer: Self-pay | Admitting: Family Medicine

## 2017-03-16 VITALS — BP 122/90 | HR 73 | Temp 97.5°F | Wt 267.2 lb

## 2017-03-16 DIAGNOSIS — G44229 Chronic tension-type headache, not intractable: Secondary | ICD-10-CM | POA: Diagnosis not present

## 2017-03-16 DIAGNOSIS — K219 Gastro-esophageal reflux disease without esophagitis: Secondary | ICD-10-CM | POA: Diagnosis not present

## 2017-03-16 DIAGNOSIS — R519 Headache, unspecified: Secondary | ICD-10-CM | POA: Insufficient documentation

## 2017-03-16 DIAGNOSIS — Z794 Long term (current) use of insulin: Secondary | ICD-10-CM | POA: Diagnosis not present

## 2017-03-16 DIAGNOSIS — R351 Nocturia: Secondary | ICD-10-CM | POA: Diagnosis not present

## 2017-03-16 DIAGNOSIS — R1310 Dysphagia, unspecified: Secondary | ICD-10-CM

## 2017-03-16 DIAGNOSIS — I10 Essential (primary) hypertension: Secondary | ICD-10-CM | POA: Diagnosis not present

## 2017-03-16 DIAGNOSIS — E1142 Type 2 diabetes mellitus with diabetic polyneuropathy: Secondary | ICD-10-CM

## 2017-03-16 DIAGNOSIS — N401 Enlarged prostate with lower urinary tract symptoms: Secondary | ICD-10-CM

## 2017-03-16 DIAGNOSIS — R51 Headache: Secondary | ICD-10-CM

## 2017-03-16 LAB — POCT GLYCOSYLATED HEMOGLOBIN (HGB A1C): Hemoglobin A1C: 7.9

## 2017-03-16 MED ORDER — AMLODIPINE BESYLATE 5 MG PO TABS: 5.0000 mg | ORAL_TABLET | Freq: Every day | ORAL | 3 refills | Status: DC

## 2017-03-16 MED ORDER — BLOOD GLUCOSE MONITOR KIT: PACK | 0 refills | Status: DC

## 2017-03-16 MED ORDER — PANTOPRAZOLE SODIUM 40 MG PO TBEC: 40.0000 mg | DELAYED_RELEASE_TABLET | Freq: Every day | ORAL | 3 refills | Status: DC

## 2017-03-16 MED ORDER — EMPAGLIFLOZIN 10 MG PO TABS: 10.0000 mg | ORAL_TABLET | Freq: Every day | ORAL | 3 refills | Status: DC

## 2017-03-16 NOTE — Assessment & Plan Note (Addendum)
Has been more elevated at home and slightly elevated today.  We will add amlodipine.  He will continue to check at home.

## 2017-03-16 NOTE — Assessment & Plan Note (Signed)
He will continue to see neurology. 

## 2017-03-16 NOTE — Progress Notes (Signed)
[name], MD Phone: [phone]  Alan Schmidt is a 72 y.o. male who presents today for f/u.  Diabetes: Last checked was 125.  Takes tresiba 30-40 units based on his numbers.  Humalog 10 units though does vary it some degree based on his numbers.  Also on Victoza and metformin.  No polyuria or polydipsia.  Rare hypoglycemia.  He eats something and this resolves.  He did see neurology for his headache and they prescribed nortriptyline.  They felt it may have been related to his head injury previously per the patient report.  Patient notes intermittently for a while now he has felt an indigestion type sensation where he will have a burning sensation up into his chest and into his throat when he lays flat.  He does note it feels like food sticks at the lower portion of his sternum when he swallows.  Notes some slight epigastric discomfort with this as well.  No trouble swallowing liquids.  He has been able to get solids to go down with drinking liquids.  No exertional discomfort.  No chest pressure.  No shortness of breath.  No blood in his stool.  BPH: A good night's 1-2 times urinating.  No straining.  Does not necessarily feel like he empties his bladder.  He is taking Flomax twice daily.  BP has been slightly more elevated at home.  He does take his current medications.  Social History   Tobacco Use  Smoking Status Former Smoker  Smokeless Tobacco Never Used     ROS see history of present illness  Objective  Physical Exam Vitals:   03/16/17 1115  BP: 122/90  Pulse: 73  Temp: (!) 97.5 F (36.4 C)  SpO2: 98%    BP Readings from Last 3 Encounters:  03/16/17 122/90  02/23/17 (!) 147/82  02/16/17 104/67   Wt Readings from Last 3 Encounters:  03/16/17 267 lb 3.2 oz (121.2 kg)  02/23/17 250 lb (113.4 kg)  02/16/17 267 lb (121.1 kg)    Physical Exam  Constitutional: No distress.  Cardiovascular: Normal rate, regular rhythm and normal heart sounds.    Pulmonary/Chest: Effort normal and breath sounds normal.  Abdominal: Soft. Bowel sounds are normal. He exhibits no distension. There is no tenderness. There is no rebound and no guarding.  Musculoskeletal: He exhibits no edema.  Neurological: He is alert. Gait normal.  Skin: Skin is warm and dry. He is not diaphoretic.     Assessment/Plan: Please see individual problem list.  Essential hypertension Has been more elevated at home and slightly elevated today.  We will add amlodipine.  He will continue to check at home.   GERD (gastroesophageal reflux disease) Seems to have increasing symptoms.  Symptoms are consistent with reflux.  Also having a sensation of food sticking.  Will start on Protonix.  Will refer to GI.  Return precautions given.  Diabetes (HCC) Check A1c.  Continue current regimen.  BPH (benign prostatic hyperplasia) Still having symptoms on Flomax.  Will refer to urology.  Chronic headache He will continue to see neurology.   Orders Placed This Encounter  Procedures  . Ambulatory referral to Urology    Referral Priority:   Routine    Referral Type:   Consultation    Referral Reason:   Specialty Services Required    Requested Specialty:   Urology    Number of Visits Requested:   1  . Ambulatory referral to Gastroenterology    Referral Priority:   Routine  Referral Type:   Consultation    Referral Reason:   Specialty Services Required    Number of Visits Requested:   1  . POCT HgB A1C    Meds ordered this encounter  Medications  . blood glucose meter kit and supplies KIT    Sig: Accuchek guide. Check CBGs 4 times daily.  ICD 10 code E11.9.    Dispense:  1 each    Refill:  0    Order Specific Question:   Number of strips    Answer:   200    Order Specific Question:   Number of lancets    Answer:   200  . pantoprazole (PROTONIX) 40 MG tablet    Sig: Take 1 tablet (40 mg total) by mouth daily.    Dispense:  30 tablet    Refill:  3  . amLODipine  (NORVASC) 5 MG tablet    Sig: Take 1 tablet (5 mg total) by mouth daily.    Dispense:  90 tablet    Refill:  3     Eric Sonnenberg, MD [facility] [place]  

## 2017-03-16 NOTE — Assessment & Plan Note (Signed)
Still having symptoms on Flomax.  Will refer to urology.

## 2017-03-16 NOTE — Assessment & Plan Note (Signed)
Check A1c.  Continue current regimen. 

## 2017-03-16 NOTE — Assessment & Plan Note (Addendum)
Seems to have increasing symptoms.  Symptoms are consistent with reflux.  Also having a sensation of food sticking.  Will start on Protonix.  Will refer to GI.  Return precautions given.

## 2017-03-16 NOTE — Patient Instructions (Signed)
Nice to see you. We will check an A1c today. We will get you referred to urology and GI.  Please start on the Protonix 30 minutes before breakfast. If you get to where you cannot get liquid or solids down or you have increasing trouble swallowing please be evaluated in the emergency department.

## 2017-03-24 DIAGNOSIS — Z743 Need for continuous supervision: Secondary | ICD-10-CM | POA: Diagnosis not present

## 2017-03-24 DIAGNOSIS — S199XXA Unspecified injury of neck, initial encounter: Secondary | ICD-10-CM | POA: Diagnosis not present

## 2017-03-25 DIAGNOSIS — I10 Essential (primary) hypertension: Secondary | ICD-10-CM | POA: Diagnosis not present

## 2017-03-25 DIAGNOSIS — S199XXA Unspecified injury of neck, initial encounter: Secondary | ICD-10-CM | POA: Diagnosis not present

## 2017-03-25 DIAGNOSIS — S1093XA Contusion of unspecified part of neck, initial encounter: Secondary | ICD-10-CM | POA: Diagnosis not present

## 2017-03-25 DIAGNOSIS — Z794 Long term (current) use of insulin: Secondary | ICD-10-CM | POA: Diagnosis not present

## 2017-03-25 DIAGNOSIS — Z87442 Personal history of urinary calculi: Secondary | ICD-10-CM | POA: Diagnosis not present

## 2017-03-25 DIAGNOSIS — Z9089 Acquired absence of other organs: Secondary | ICD-10-CM | POA: Diagnosis not present

## 2017-03-25 DIAGNOSIS — M25551 Pain in right hip: Secondary | ICD-10-CM | POA: Diagnosis not present

## 2017-03-25 DIAGNOSIS — S34109A Unspecified injury to unspecified level of lumbar spinal cord, initial encounter: Secondary | ICD-10-CM | POA: Diagnosis not present

## 2017-03-25 DIAGNOSIS — E119 Type 2 diabetes mellitus without complications: Secondary | ICD-10-CM | POA: Diagnosis not present

## 2017-03-25 DIAGNOSIS — S1083XA Contusion of other specified part of neck, initial encounter: Secondary | ICD-10-CM | POA: Diagnosis not present

## 2017-03-25 DIAGNOSIS — S3993XA Unspecified injury of pelvis, initial encounter: Secondary | ICD-10-CM | POA: Diagnosis not present

## 2017-03-25 DIAGNOSIS — M545 Low back pain: Secondary | ICD-10-CM | POA: Diagnosis not present

## 2017-03-25 DIAGNOSIS — S0990XA Unspecified injury of head, initial encounter: Secondary | ICD-10-CM | POA: Diagnosis not present

## 2017-03-30 ENCOUNTER — Ambulatory Visit: Payer: Medicare Other | Attending: Nurse Practitioner | Admitting: Nurse Practitioner

## 2017-03-30 ENCOUNTER — Encounter: Payer: Self-pay | Admitting: *Deleted

## 2017-04-01 ENCOUNTER — Telehealth: Payer: Self-pay | Admitting: Family Medicine

## 2017-04-01 NOTE — Telephone Encounter (Signed)
Copied from [place]. Topic: Quick Communication - See Telephone Encounter >> Apr 01, 2017  8:11 AM Hewitt Shorts wrote: CRM for notification. See Telephone encounter for:  Pt fell out of town and was seen in the er at the location and was given fexiril  (pt believes ) back and right pain and is wanting to know if something else can be called in for pain   Best number [phone]  [facility]  04/01/17.

## 2017-04-01 NOTE — Telephone Encounter (Signed)
Left message for pt to call the office back to discuss symptoms.

## 2017-04-02 ENCOUNTER — Ambulatory Visit: Payer: Self-pay

## 2017-04-02 NOTE — Telephone Encounter (Signed)
Patient called with c/o "back pain." He said "I fell in the shower last Friday while out of town. I fell backwards and on my bottom and hit my head. I went to the emergency room and was given flexeril. I've been taking Tylenol for the pain. But, the pain is not getting better. I feel like a muscle is pulled and it hurts from my mid back around left anterior side to my hip joint, then my lower back down to my coccyx. I noticed the pain in my coccyx when I sit down. I was wondering if Dr. [name] can refill the flexeril." I asked how severe is the pain, he says "10 at the worst, but after the Tylenol, it goes down to a 8." I asked is he having any symptoms of numbness, weakness to his legs or arms, he says "no." I asked has he noticed any changes to his bowel or urine problems since the injury, he said "no." According to protocol, see PCP within 3 days. He wants to be seen today. I advised no availability with provider or practice, offered him to be seen at another [facility] practice near his residence, which would be [facility], he said "I will just wait to see Dr. [name]." I advised the earliest appointment is Monday but with another provider at the office, he agreed, appointment made for Monday, 04/05/17 at 0930 with Dr. Terese Door, care advice given, advised of the Saturday clinic at [facility] if he wants to be seen tomorrow, advised to call the office back today after 1 pm to schedule the Saturday appointment if he chooses, he verbalized understanding.  Reason for Disposition . [1] After 3 days (72 hours) AND [2] back pain not improving  Answer Assessment - Initial Assessment Questions 1. MECHANISM: "How did the injury happen?" (Consider the possibility of domestic violence or elder abuse)    Fall last Friday 2. ONSET: "When did the injury happen?" (Minutes or hours ago)    [address] backwards on bottom and head hit tub 3. LOCATION: "What part of the back is injured?"     Mid back around left side to  hip joint, lower back to coccyx 4. SEVERITY: "Can you move the back normally?"     Yes, it's painful to twist 5. PAIN: "Is there any pain?" If so, ask: "How bad is the pain?"   (Scale 1-10; or mild, moderate, severe)     10 at it's worst; 8 at times 6. CORD SYMPTOMS: Any weakness or numbness of the arms or legs?"     No 7. SIZE: For cuts, bruises, or swelling, ask: "How large is it?" (e.g., inches or centimeters)     No 8. TETANUS: For any breaks in the skin, ask: "When was the last tetanus booster?"     N/A 9. OTHER SYMPTOMS: "Do you have any other symptoms?" (e.g., abdominal pain, blood in urine)     No 10. PREGNANCY: "Is there any chance you are pregnant?" "When was your last menstrual period?"       N/A  Protocols used: BACK INJURY-A-AH

## 2017-04-02 NOTE — Telephone Encounter (Signed)
Patient returned call, see triage encounter for 04/02/17.

## 2017-04-03 ENCOUNTER — Other Ambulatory Visit: Payer: Self-pay | Admitting: Family Medicine

## 2017-04-05 ENCOUNTER — Ambulatory Visit: Payer: Medicare Other | Admitting: Internal Medicine

## 2017-04-07 ENCOUNTER — Ambulatory Visit: Payer: Self-pay | Admitting: Urology

## 2017-04-13 NOTE — Progress Notes (Deleted)
Patient's Name: Alan Schmidt  MRN: [id number]  Referring Provider: [name], MD  DOB: 1945/09/08  PCP: Alan Haven, MD  DOS: 04/14/2017  Note by: Alan Cola, MD  Service setting: Ambulatory outpatient  Specialty: Interventional Pain Management  Location: [facility] (AMB) Pain Management Facility    Patient type: Established   Primary Reason(s) for Visit: Encounter for post-procedure evaluation of chronic illness with mild to moderate exacerbation, as well as review of chronic illnesses with exacerbation, or progression (Level of risk: moderate) CC: No chief complaint on file.  HPI  Alan Schmidt is a 72 y.o. year old, male patient, who comes today for a post-procedure evaluation. He has Paraparesis ([place]); Chronic low back pain (Primary Source of Pain) (Bilateral) (R>L); Lumbar facet syndrome (Bilateral) (R>L); Lumbar spondylosis; Diabetic peripheral neuropathy ([place]); Long term current use of opiate analgesic; Long term prescription opiate use; Opiate use (60 MME/Day); Opiate dependence ([place]); Encounter for therapeutic drug level monitoring; Chronic neck pain (midline over the C7 spinous processes) (L>R); Neurogenic pain; Neuropathic pain; Contrast dye allergy; Chronic lower extremity pain (Secondary source of pain) (Bilateral) (R>L); 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) (Right L5 dermatome); History of total hip replacement (Right); Chronic hip pain (Right); Class I Morbid obesity (HCC) (68% higher incidence of chronic low back pain); Essential hypertension; GERD (gastroesophageal reflux disease); Obstructive sleep apnea; Hyperlipidemia; Chronic kidney disease (CKD); Abnormal nerve conduction studies (severe bilateral lower extremity polyneuropathy); Chronic shoulder pain (Bilateral) (R>L); Occipital neuralgia (Left); Cervicogenic headache (Left); Diabetes ([place]); BPH (benign prostatic hyperplasia); History of Vasovagal response to spinal injections; Allergic rhinitis;  Anxiety and depression; Disturbance of skin sensation; Chronic shoulder radicular pain (Left); Chronic cervical radicular pain (Left); Chronic pain syndrome; Opioid-induced constipation (OIC); History of postoperative nausea and vomiting; Nevus; Lumbar facet joint osteoarthritis (Bilateral); History of allergy to radiographic contrast media; Hypotension during surgery; Chronic headache; Chronic tension-type headache, intractable; and Vertigo on their problem list. His primarily concern today is the No chief complaint on file.  Pain Assessment: Location:     Radiating:   Onset:   Duration:   Quality:   Severity:  /10 (self-reported pain score)  Note: Reported level is compatible with observation.                         When using our objective Pain Scale, levels between 6 and 10/10 are said to belong in an emergency room, as it progressively worsens from a 6/10, described as severely limiting, requiring emergency care not usually available at an outpatient pain management facility. At a 6/10 level, communication becomes difficult and requires great effort. Assistance to reach the emergency department may be required. Facial flushing and profuse sweating along with potentially dangerous increases in heart rate and blood pressure will be evident. Effect on ADL:   Timing:   Modifying factors:    Alan Schmidt comes in today for review of his chronic pain problem and post-procedure evaluation after the treatment done on 02/16/2017.   Further details on both, my assessment(s), as well as the proposed treatment plan, please see below.  Post-Procedure Assessment  02/16/2017 Procedure: Therapeutic left lumbar facet joint, medial branch RFA under fluoroscopic guidance and IV sedation Pre-procedure pain score:  5/10 Post-procedure pain score: 0/10 (100% relief) Influential Factors: BMI:   Intra-procedural challenges: None observed.         Assessment challenges: None detected.              Reported  side-effects: None.        Post-procedural adverse reactions or complications: None reported         Sedation: Sedation provided. When no sedatives are used, the analgesic levels obtained are directly associated to the effectiveness of the local anesthetics. However, when sedation is provided, the level of analgesia obtained during the initial 1 hour following the intervention, is believed to be the result of a combination of factors. These factors may include, but are not limited to: 1. The effectiveness of the local anesthetics used. 2. The effects of the analgesic(s) and/or anxiolytic(s) used. 3. The degree of discomfort experienced by the patient at the time of the procedure. 4. The patients ability and reliability in recalling and recording the events. 5. The presence and influence of possible secondary gains and/or psychosocial factors. Reported result: Relief experienced during the 1st hour after the procedure:   (Ultra-Short Term Relief)            Interpretative annotation: Clinically appropriate result. Analgesia during this period is likely to be Local Anesthetic and/or IV Sedative (Analgesic/Anxiolytic) related.          Effects of local anesthetic: The analgesic effects attained during this period are directly associated to the localized infiltration of local anesthetics and therefore cary significant diagnostic value as to the etiological location, or anatomical origin, of the pain. Expected duration of relief is directly dependent on the pharmacodynamics of the local anesthetic used. Long-acting (4-6 hours) anesthetics used.  Reported result: Relief during the next 4 to 6 hour after the procedure:   (Short-Term Relief)            Interpretative annotation: Clinically appropriate result. Analgesia during this period is likely to be Local Anesthetic-related.          Long-term benefit: Defined as the period of time past the expected duration of local anesthetics (1 hour for short-acting  and 4-6 hours for long-acting). With the possible exception of prolonged sympathetic blockade from the local anesthetics, benefits during this period are typically attributed to, or associated with, other factors such as analgesic sensory neuropraxia, antiinflammatory effects, or beneficial biochemical changes provided by agents other than the local anesthetics.  Reported result: Extended relief following procedure:   (Long-Term Relief)            Interpretative annotation: Clinically appropriate result. Good relief. No permanent benefit expected. Inflammation plays a part in the etiology to the pain.          Current benefits: Defined as reported results that persistent at this point in time.   Analgesia: *** %            Function: Somewhat improved ROM: Somewhat improved Interpretative annotation: Recurrence of symptoms. No permanent benefit expected. Effective diagnostic intervention.          Interpretation: Results would suggest a successful diagnostic intervention.                  Plan:  Please see "Plan of Care" for details.                Laboratory Chemistry  Inflammation Markers (CRP: Acute Phase) (ESR: Chronic Phase) Lab Results  Component Value Date   CRP 0.5 03/06/2015   ESRSEDRATE 45 (H) 12/14/2016                         Renal Function Markers Lab Results  Component Value Date   BUN 13 12/14/2016  CREATININE 0.89 12/14/2016   GFRAA >60 10/25/2016   GFRNONAA >60 10/25/2016                 Hepatic Function Markers Lab Results  Component Value Date   AST 25 09/25/2016   ALT 27 09/25/2016   ALBUMIN 3.9 09/25/2016   ALKPHOS 67 09/25/2016                 Electrolytes Lab Results  Component Value Date   NA 136 12/14/2016   K 4.3 12/14/2016   CL 100 12/14/2016   CALCIUM 9.1 12/14/2016   MG 1.8 03/06/2015                        Neuropathy Markers Lab Results  Component Value Date   HGBA1C 7.9 03/16/2017                 Coagulation Parameters Lab  Results  Component Value Date   PLT 278 10/25/2016                 Cardiovascular Markers Lab Results  Component Value Date   HGB 13.8 10/25/2016   HCT 41.5 10/25/2016                 Note: Lab results reviewed.  Recent Diagnostic Imaging Review  Foot Imaging: Foot-L DG Complete:  Results for orders placed in visit on 04/09/15  DG Foot Complete Left   Narrative 4 views of a skeletally mature individual were obtained of the left foot.  Study includes AP, oblique, lateral, and calcaneal axial projections.  A skin marker is utilized to identify an area of possible foreign body.  There is no underlying foreign body identified at this time. Hammertoe  contractures are present. There is midfoot arthritic changes present. Old  possible fracture to the fifth toe.      Complexity Note: Imaging results reviewed. Results shared with Mr. Grandville Silos, using Layman's terms.                         Meds   Current Outpatient Medications:  .  ACCU-CHEK COMPACT PLUS test strip, USE 1 STRIP SEVEN TIMES A DAY, Disp: 204 each, Rfl: 6 .  acetaminophen (TYLENOL) 500 MG tablet, Take 500 mg by mouth every 6 (six) hours as needed., Disp: , Rfl:  .  ALPRAZolam (XANAX) 0.5 MG tablet, Take 1 tablet (0.5 mg total) by mouth 2 (two) times daily as needed., Disp: 60 tablet, Rfl: 2 .  amLODipine (NORVASC) 5 MG tablet, Take 1 tablet (5 mg total) by mouth daily., Disp: 90 tablet, Rfl: 3 .  aspirin EC 325 MG tablet, Take 325 mg by mouth daily. , Disp: , Rfl:  .  BD PEN NEEDLE NANO U/F 32G X 4 MM MISC, USE EVERY DAY, Disp: 100 each, Rfl: 4 .  blood glucose meter kit and supplies KIT, Accuchek guide. Check CBGs 4 times daily.  ICD 10 code E11.9., Disp: 1 each, Rfl: 0 .  carvedilol (COREG) 25 MG tablet, TAKE 1 TABLET BY MOUTH TWICE A DAY, Disp: 180 tablet, Rfl: 1 .  desoximetasone (TOPICORT) 0.25 % cream, APPLY CREAM TO AFFECTED AREA TWO TIMES DAILY, Disp: , Rfl: 3 .  empagliflozin (JARDIANCE) 10 MG TABS tablet,  Take 10 mg by mouth daily., Disp: 30 tablet, Rfl: 3 .  FLUoxetine (PROZAC) 40 MG capsule, TAKE ONE CAPSULE BY MOUTH TWO TIMES DAILY, Disp: 180 capsule, Rfl:  1 .  hydrochlorothiazide (HYDRODIURIL) 25 MG tablet, TAKE 1 TABLET BY MOUTH EVERY DAY, Disp: 90 tablet, Rfl: 1 .  hydrOXYzine (ATARAX/VISTARIL) 50 MG tablet, TAKE 1 TABLET BY MOUTH FOUR TIMES A DAY AS NEEDED, Disp: 90 tablet, Rfl: 1 .  insulin lispro (HUMALOG KWIKPEN) 100 UNIT/ML KiwkPen, Inject 0.1 mLs (10 Units total) into the skin 3 (three) times daily with meals., Disp: 15 mL, Rfl: 3 .  loratadine (CLARITIN) 10 MG tablet, Take 1 tablet (10 mg total) by mouth daily., Disp: 30 tablet, Rfl: 11 .  magnesium 30 MG tablet, Take 30 mg by mouth 2 (two) times daily., Disp: , Rfl:  .  metFORMIN (GLUCOPHAGE) 1000 MG tablet, TAKE 1 TABLET (1,000 MG TOTAL) BY MOUTH 2 (TWO) TIMES DAILY WITH A MEAL., Disp: 180 tablet, Rfl: 2 .  mometasone (NASONEX) 50 MCG/ACT nasal spray, Place 2 sprays into the nose daily., Disp: 17 g, Rfl: 12 .  naloxone (NARCAN) 2 MG/2ML injection, Inject content of syringe into thigh muscle. Call 911., Disp: 2 Syringe, Rfl: 1 .  olmesartan (BENICAR) 40 MG tablet, TAKE 1 TABLET BY MOUTH EVERY DAY, Disp: 90 tablet, Rfl: 1 .  pantoprazole (PROTONIX) 40 MG tablet, Take 1 tablet (40 mg total) by mouth daily., Disp: 30 tablet, Rfl: 3 .  pravastatin (PRAVACHOL) 40 MG tablet, TAKE 1 TABLET BY MOUTH EVERY DAY, Disp: 90 tablet, Rfl: 1 .  tamsulosin (FLOMAX) 0.4 MG CAPS capsule, TAKE 1 CAPSULE (0.4 MG TOTAL) BY MOUTH 2 (TWO) TIMES DAILY., Disp: 180 capsule, Rfl: 2 .  TRESIBA FLEXTOUCH 100 UNIT/ML SOPN FlexTouch Pen, INJECT 40 UNITS INTO THE SKIN DAILY AT 10 PM., Disp: 15 pen, Rfl: 3 .  VICTOZA 18 MG/3ML SOPN, INJECT 0.3 MLS (1.8 MG TOTAL) INTO THE SKIN DAILY., Disp: 27 pen, Rfl: 1  ROS  Constitutional: Denies any fever or chills Gastrointestinal: No reported hemesis, hematochezia, vomiting, or acute GI distress Musculoskeletal: Denies any  acute onset joint swelling, redness, loss of ROM, or weakness Neurological: No reported episodes of acute onset apraxia, aphasia, dysarthria, agnosia, amnesia, paralysis, loss of coordination, or loss of consciousness  Allergies  Mr. Borge is allergic to contrast media [iodinated diagnostic agents]; iodine; and shellfish allergy.  [place]  Drug: Mr. Brem  reports that he does not use drugs. Alcohol:  reports that he does not drink alcohol. Tobacco:  reports that he has quit smoking. he has never used smokeless tobacco. Medical:  has a past medical history of Acute postoperative pain (11/24/2016), Anxiety, Chronic hip pain (Right) (12/05/2014), Chronic lumbar pain, Depression, Hyperlipidemia, Hypertension, Kidney stones, and Migraines. Surgical: Mr. Graumann  has a past surgical history that includes Total hip arthroplasty; Tonsillectomy; and right hip surgery. Family: family history includes Cancer in his mother; Heart disease in his father; Stroke in his father.  Constitutional Exam  General appearance: Well nourished, well developed, and well hydrated. In no apparent acute distress There were no vitals filed for this visit. BMI Assessment: Estimated body mass index is 35.25 kg/m as calculated from the following:   Height as of 02/23/17: 6' 1"  (1.854 m).   Weight as of 03/16/17: 267 lb 3.2 oz (121.2 kg).  BMI interpretation table: BMI level Category Range association with higher incidence of chronic pain  <18 kg/m2 Underweight   18.5-24.9 kg/m2 Ideal body weight   25-29.9 kg/m2 Overweight Increased incidence by 20%  30-34.9 kg/m2 Obese (Class I) Increased incidence by 68%  35-39.9 kg/m2 Severe obesity (Class II) Increased incidence by 136%  >40  kg/m2 Extreme obesity (Class III) Increased incidence by 254%   BMI Readings from Last 4 Encounters:  03/16/17 35.25 kg/m  02/23/17 32.98 kg/m  02/16/17 35.23 kg/m  12/10/16 35.51 kg/m   Wt Readings from Last 4 Encounters:  03/16/17  267 lb 3.2 oz (121.2 kg)  02/23/17 250 lb (113.4 kg)  02/16/17 267 lb (121.1 kg)  12/10/16 269 lb 2 oz (122.1 kg)  Psych/Mental status: Alert, oriented x 3 (person, place, & time)       Eyes: PERLA Respiratory: No evidence of acute respiratory distress  Cervical Spine Area Exam  Skin & Axial Inspection: No masses, redness, edema, swelling, or associated skin lesions Alignment: Symmetrical Functional ROM: Unrestricted ROM      Stability: No instability detected Muscle Tone/Strength: Functionally intact. No obvious neuro-muscular anomalies detected. Sensory (Neurological): Unimpaired Palpation: No palpable anomalies              Upper Extremity (UE) Exam    Side: Right upper extremity  Side: Left upper extremity  Skin & Extremity Inspection: Skin color, temperature, and hair growth are WNL. No peripheral edema or cyanosis. No masses, redness, swelling, asymmetry, or associated skin lesions. No contractures.  Skin & Extremity Inspection: Skin color, temperature, and hair growth are WNL. No peripheral edema or cyanosis. No masses, redness, swelling, asymmetry, or associated skin lesions. No contractures.  Functional ROM: Unrestricted ROM          Functional ROM: Unrestricted ROM          Muscle Tone/Strength: Functionally intact. No obvious neuro-muscular anomalies detected.  Muscle Tone/Strength: Functionally intact. No obvious neuro-muscular anomalies detected.  Sensory (Neurological): Unimpaired          Sensory (Neurological): Unimpaired          Palpation: No palpable anomalies              Palpation: No palpable anomalies              Specialized Test(s): Deferred         Specialized Test(s): Deferred          Thoracic Spine Area Exam  Skin & Axial Inspection: No masses, redness, or swelling Alignment: Symmetrical Functional ROM: Unrestricted ROM Stability: No instability detected Muscle Tone/Strength: Functionally intact. No obvious neuro-muscular anomalies detected. Sensory  (Neurological): Unimpaired Muscle strength & Tone: No palpable anomalies  Lumbar Spine Area Exam  Skin & Axial Inspection: No masses, redness, or swelling Alignment: Symmetrical Functional ROM: Unrestricted ROM      Stability: No instability detected Muscle Tone/Strength: Functionally intact. No obvious neuro-muscular anomalies detected. Sensory (Neurological): Unimpaired Palpation: No palpable anomalies       Provocative Tests: Lumbar Hyperextension and rotation test: evaluation deferred today       Lumbar Lateral bending test: evaluation deferred today       Patrick's Maneuver: evaluation deferred today                    Gait & Posture Assessment  Ambulation: Unassisted Gait: Relatively normal for age and body habitus Posture: WNL   Lower Extremity Exam    Side: Right lower extremity  Side: Left lower extremity  Skin & Extremity Inspection: Skin color, temperature, and hair growth are WNL. No peripheral edema or cyanosis. No masses, redness, swelling, asymmetry, or associated skin lesions. No contractures.  Skin & Extremity Inspection: Skin color, temperature, and hair growth are WNL. No peripheral edema or cyanosis. No masses, redness, swelling, asymmetry, or associated skin lesions.  No contractures.  Functional ROM: Unrestricted ROM          Functional ROM: Unrestricted ROM          Muscle Tone/Strength: Functionally intact. No obvious neuro-muscular anomalies detected.  Muscle Tone/Strength: Functionally intact. No obvious neuro-muscular anomalies detected.  Sensory (Neurological): Unimpaired  Sensory (Neurological): Unimpaired  Palpation: No palpable anomalies  Palpation: No palpable anomalies   Assessment  Primary Diagnosis & Pertinent Problem List: There were no encounter diagnoses.  Status Diagnosis  Controlled Controlled Controlled No diagnosis found.  Problems updated and reviewed during this visit: Problem  Chronic Headache  Chronic Tension-Type Headache,  Intractable  Vertigo  Acute Postoperative Pain (Resolved)   Plan of Care  Pharmacotherapy (Medications Ordered): No orders of the defined types were placed in this encounter.  Medications administered today: Karmen Stabs. [place] had no medications administered during this visit.  Procedure Orders    No procedure(s) ordered today   Lab Orders  No laboratory test(s) ordered today   Imaging Orders  No imaging studies ordered today   Referral Orders  No referral(s) requested today    Interventional management options: Planned, scheduled, and/or pending:   ***   Considering:   ***   Palliative PRN treatment(s):   None at this time   Provider-requested follow-up: No Follow-up on file.  Future Appointments  Date Time Provider [place]  04/14/2017  9:00 AM Milinda Pointer, MD [facility] None  04/14/2017  2:30 PM Pleas Koch, NP [facility]  04/27/2017  2:00 PM Jonathon Bellows, MD [facility] None  09/27/2017  8:30 AM Alan Haven, MD [facility]   Primary Care Physician: Alan Haven, MD Location: [facility] Note by: Alan Cola, MD Date: 04/14/2017; Time: 7:19 AM

## 2017-04-14 ENCOUNTER — Ambulatory Visit: Payer: Medicare Other | Admitting: Primary Care

## 2017-04-14 ENCOUNTER — Encounter: Payer: Self-pay | Admitting: Pain Medicine

## 2017-04-14 ENCOUNTER — Ambulatory Visit: Payer: Medicare Other | Admitting: Pain Medicine

## 2017-04-16 ENCOUNTER — Telehealth: Payer: Self-pay | Admitting: Family Medicine

## 2017-04-16 NOTE — Telephone Encounter (Signed)
Copied from [place]. Topic: Quick Communication - Rx Refill/Question >> Apr 16, 2017  3:56 PM Oliver Pila B wrote: Medication: accu check guide test strips (pt has a new meter)   Has the patient contacted their pharmacy? No.   (Agent: If no, request that the patient contact the pharmacy for the refill.)   Preferred Pharmacy (with phone number or street name): [facility]   Agent: Please be advised that RX refills may take up to 3 business days. We ask that you follow-up with your pharmacy.

## 2017-04-16 NOTE — Telephone Encounter (Signed)
Pt requesting new script for Accu Check guide test strips(pt has a new meter)  LOV: 03/16/17  Dr. [name]  [facility] on [facility]

## 2017-04-18 NOTE — Progress Notes (Signed)
Patient's Name: Alan Schmidt  MRN: [id number]  Referring Provider: [name], MD  DOB: 04/27/1945  PCP: [name], MD  DOS: 04/19/2017  Note by: Gaspar Cola, MD  Service setting: Ambulatory outpatient  Specialty: Interventional Pain Management  Location: [facility] (AMB) Pain Management Facility    Patient type: Established   Primary Reason(s) for Visit: Encounter for post-procedure evaluation of chronic illness with mild to moderate exacerbation, as well as review of chronic illnesses with exacerbation, or progression (Level of risk: moderate) CC: Back Pain (low); Leg Pain (right); and Groin Pain (right)  HPI  Mr. Styer is a 72 y.o. year old, male patient, who comes today for a post-procedure evaluation. He has Paraparesis ([place]); Chronic low back pain (Primary Source of Pain) (Bilateral) (R>L); Lumbar facet syndrome (Bilateral) (R>L); Lumbar spondylosis; Diabetic peripheral neuropathy ([place]); Long term current use of opiate analgesic; Long term prescription opiate use; Opiate use (60 MME/Day); Opiate dependence ([place]); Encounter for therapeutic drug level monitoring; Chronic neck pain (midline over the C7 spinous processes) (L>R); Neurogenic pain; Neuropathic pain; Contrast dye allergy; Chronic lower extremity pain (Secondary source of pain) (Bilateral) (R>L); 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) (Right L5 dermatome); History of total hip replacement (Right); Chronic hip pain (Right); Class I Morbid obesity (HCC) (68% higher incidence of chronic low back pain); Essential hypertension; GERD (gastroesophageal reflux disease); Obstructive sleep apnea; Hyperlipidemia; Chronic kidney disease (CKD); Abnormal nerve conduction studies (severe bilateral lower extremity polyneuropathy); Chronic shoulder pain (Bilateral) (R>L); Occipital neuralgia (Left); Cervicogenic headache (Left); Diabetes ([place]); BPH (benign prostatic hyperplasia); History of Vasovagal response to spinal  injections; Allergic rhinitis; Anxiety and depression; Disturbance of skin sensation; Chronic shoulder radicular pain (Left); Chronic cervical radicular pain (Left); Chronic pain syndrome; Opioid-induced constipation (OIC); History of postoperative nausea and vomiting; Nevus; Lumbar facet joint osteoarthritis (Bilateral); History of allergy to radiographic contrast media; Hypotension during surgery; Chronic headache; Chronic tension-type headache, intractable; Vertigo; Coccygodynia; DDD (degenerative disc disease), lumbar; and Chronic musculoskeletal pain on their problem list. His primarily concern today is the Back Pain (low); Leg Pain (right); and Groin Pain (right)  Pain Assessment: Location: Lower Back Radiating: right leg and right groin Onset: More than a month ago Duration: Chronic pain Quality: Constant, Dull Severity: 8 /10 (self-reported pain score)  Note: Reported level is inconsistent with clinical observations. Clinically the patient looks like a 2/10 A 2/10 is viewed as "Mild to Moderate" and described as noticeable and distracting. Impossible to hide from other people. More frequent flare-ups. Still possible to adapt and function close to normal. It can be very annoying and may have occasional stronger flare-ups. With discipline, patients may get used to it and adapt. Information on the proper use of the pain scale provided to the patient today. When using our objective Pain Scale, levels between 6 and 10/10 are said to belong in an emergency room, as it progressively worsens from a 6/10, described as severely limiting, requiring emergency care not usually available at an outpatient pain management facility. At a 6/10 level, communication becomes difficult and requires great effort. Assistance to reach the emergency department may be required. Facial flushing and profuse sweating along with potentially dangerous increases in heart rate and blood pressure will be evident. Timing:  Constant Modifying factors: procedures  Patient comes into the clinic today for an evaluation after a recent fall that he suffered in [place] while staying at a hotel.  Apparently he hit his back as well as his neck but he denies  having lost any consciousness.  He went to the emergency room and had x-rays and what he describes and as an MRI of the cervical spine and head, which they told him was negative.  At this point, he is continued to have pain in the lower back in the area of the tailbone.  I will be bringing him back for a caudal epidural steroid injection.  The patient was again informed that he needs to be using the pain scale that we have provided him.  Mr. Holdren comes in today for review of his chronic pain problem and post-procedure evaluation after the treatment done on 02/16/2017.   Further details on both, my assessment(s), as well as the proposed treatment plan, please see below.  Post-Procedure Assessment  02/16/2017 Procedure: Bilateral lumbar facet radiofrequency ablation under fluoroscopic guidance and IV sedation Pre-procedure pain score:  5/10 Post-procedure pain score: 0/10         Influential Factors: BMI: 32.98 kg/m Intra-procedural challenges: None observed.         Assessment challenges: None detected.              Reported side-effects: None.        Post-procedural adverse reactions or complications: None reported         Sedation: Sedation provided. When no sedatives are used, the analgesic levels obtained are directly associated to the effectiveness of the local anesthetics. However, when sedation is provided, the level of analgesia obtained during the initial 1 hour following the intervention, is believed to be the result of a combination of factors. These factors may include, but are not limited to: 1. The effectiveness of the local anesthetics used. 2. The effects of the analgesic(s) and/or anxiolytic(s) used. 3. The degree of discomfort experienced by the  patient at the time of the procedure. 4. The patients ability and reliability in recalling and recording the events. 5. The presence and influence of possible secondary gains and/or psychosocial factors. Reported result: Relief experienced during the 1st hour after the procedure: 70 % (Ultra-Short Term Relief)            Interpretative annotation: Clinically appropriate result. Analgesia during this period is likely to be Local Anesthetic and/or IV Sedative (Analgesic/Anxiolytic) related.          Effects of local anesthetic: The analgesic effects attained during this period are directly associated to the localized infiltration of local anesthetics and therefore cary significant diagnostic value as to the etiological location, or anatomical origin, of the pain. Expected duration of relief is directly dependent on the pharmacodynamics of the local anesthetic used. Long-acting (4-6 hours) anesthetics used.  Reported result: Relief during the next 4 to 6 hour after the procedure: 80 % (Short-Term Relief)            Interpretative annotation: Clinically appropriate result. Analgesia during this period is likely to be Local Anesthetic-related.          Long-term benefit: Defined as the period of time past the expected duration of local anesthetics (1 hour for short-acting and 4-6 hours for long-acting). With the possible exception of prolonged sympathetic blockade from the local anesthetics, benefits during this period are typically attributed to, or associated with, other factors such as analgesic sensory neuropraxia, antiinflammatory effects, or beneficial biochemical changes provided by agents other than the local anesthetics.  Reported result: Extended relief following procedure: 50 % (Long-Term Relief)            Interpretative annotation: Clinically appropriate result. Good  relief. No permanent benefit expected. Inflammation plays a part in the etiology to the pain.          Current benefits: Defined  as reported results that persistent at this point in time.   Analgesia: 50 %            Function: Somewhat improved ROM: Somewhat improved Interpretative annotation: Recurrence of symptoms. No permanent benefit expected. Effective diagnostic intervention.          Interpretation: Results would suggest a successful diagnostic intervention.                  Plan:  Please see "Plan of Care" for details.                Laboratory Chemistry  Inflammation Markers (CRP: Acute Phase) (ESR: Chronic Phase) Lab Results  Component Value Date   CRP 0.5 03/06/2015   ESRSEDRATE 45 (H) 12/14/2016                         Renal Function Markers Lab Results  Component Value Date   BUN 13 12/14/2016   CREATININE 0.89 12/14/2016   GFRAA >60 10/25/2016   GFRNONAA >60 10/25/2016                 Hepatic Function Markers Lab Results  Component Value Date   AST 25 09/25/2016   ALT 27 09/25/2016   ALBUMIN 3.9 09/25/2016   ALKPHOS 67 09/25/2016                 Electrolytes Lab Results  Component Value Date   NA 136 12/14/2016   K 4.3 12/14/2016   CL 100 12/14/2016   CALCIUM 9.1 12/14/2016   MG 1.8 03/06/2015                        Neuropathy Markers Lab Results  Component Value Date   HGBA1C 7.9 03/16/2017                 Coagulation Parameters Lab Results  Component Value Date   PLT 278 10/25/2016                 Cardiovascular Markers Lab Results  Component Value Date   HGB 13.8 10/25/2016   HCT 41.5 10/25/2016                 Note: Lab results reviewed.  Recent Diagnostic Imaging Review  Foot Imaging: Foot-L DG Complete:  Results for orders placed in visit on 04/09/15  DG Foot Complete Left   Narrative 4 views of a skeletally mature individual were obtained of the left foot.  Study includes AP, oblique, lateral, and calcaneal axial projections.  A skin marker is utilized to identify an area of possible foreign body.  There is no underlying foreign body identified  at this time. Hammertoe  contractures are present. There is midfoot arthritic changes present. Old  possible fracture to the fifth toe.      Complexity Note: Imaging results reviewed. Results shared with Mr. Grandville Silos, using Layman's terms.                         Meds   Current Outpatient Medications:  .  ACCU-CHEK COMPACT PLUS test strip, USE 1 STRIP SEVEN TIMES A DAY, Disp: 204 each, Rfl: 6 .  acetaminophen (TYLENOL) 500 MG tablet, Take 500 mg by mouth every  6 (six) hours as needed., Disp: , Rfl:  .  ALPRAZolam (XANAX) 0.5 MG tablet, Take 1 tablet (0.5 mg total) by mouth 2 (two) times daily as needed., Disp: 60 tablet, Rfl: 2 .  amLODipine (NORVASC) 5 MG tablet, Take 1 tablet (5 mg total) by mouth daily., Disp: 90 tablet, Rfl: 3 .  aspirin EC 325 MG tablet, Take 325 mg by mouth daily. , Disp: , Rfl:  .  BD PEN NEEDLE NANO U/F 32G X 4 MM MISC, USE EVERY DAY, Disp: 100 each, Rfl: 4 .  blood glucose meter kit and supplies KIT, Accuchek guide. Check CBGs 4 times daily.  ICD 10 code E11.9., Disp: 1 each, Rfl: 0 .  carvedilol (COREG) 25 MG tablet, TAKE 1 TABLET BY MOUTH TWICE A DAY, Disp: 180 tablet, Rfl: 1 .  desoximetasone (TOPICORT) 0.25 % cream, APPLY CREAM TO AFFECTED AREA TWO TIMES DAILY, Disp: , Rfl: 3 .  empagliflozin (JARDIANCE) 10 MG TABS tablet, Take 10 mg by mouth daily., Disp: 30 tablet, Rfl: 3 .  FLUoxetine (PROZAC) 40 MG capsule, TAKE ONE CAPSULE BY MOUTH TWO TIMES DAILY, Disp: 180 capsule, Rfl: 1 .  hydrochlorothiazide (HYDRODIURIL) 25 MG tablet, TAKE 1 TABLET BY MOUTH EVERY DAY, Disp: 90 tablet, Rfl: 1 .  hydrOXYzine (ATARAX/VISTARIL) 50 MG tablet, TAKE 1 TABLET BY MOUTH FOUR TIMES A DAY AS NEEDED, Disp: 90 tablet, Rfl: 1 .  insulin lispro (HUMALOG KWIKPEN) 100 UNIT/ML KiwkPen, Inject 0.1 mLs (10 Units total) into the skin 3 (three) times daily with meals., Disp: 15 mL, Rfl: 3 .  loratadine (CLARITIN) 10 MG tablet, Take 1 tablet (10 mg total) by mouth daily., Disp: 30 tablet,  Rfl: 11 .  magnesium 30 MG tablet, Take 30 mg by mouth 2 (two) times daily., Disp: , Rfl:  .  metFORMIN (GLUCOPHAGE) 1000 MG tablet, TAKE 1 TABLET (1,000 MG TOTAL) BY MOUTH 2 (TWO) TIMES DAILY WITH A MEAL., Disp: 180 tablet, Rfl: 2 .  mometasone (NASONEX) 50 MCG/ACT nasal spray, Place 2 sprays into the nose daily., Disp: 17 g, Rfl: 12 .  naloxone (NARCAN) 2 MG/2ML injection, Inject content of syringe into thigh muscle. Call 911., Disp: 2 Syringe, Rfl: 1 .  olmesartan (BENICAR) 40 MG tablet, TAKE 1 TABLET BY MOUTH EVERY DAY, Disp: 90 tablet, Rfl: 1 .  pantoprazole (PROTONIX) 40 MG tablet, Take 1 tablet (40 mg total) by mouth daily., Disp: 30 tablet, Rfl: 3 .  pravastatin (PRAVACHOL) 40 MG tablet, TAKE 1 TABLET BY MOUTH EVERY DAY, Disp: 90 tablet, Rfl: 1 .  tamsulosin (FLOMAX) 0.4 MG CAPS capsule, TAKE 1 CAPSULE (0.4 MG TOTAL) BY MOUTH 2 (TWO) TIMES DAILY., Disp: 180 capsule, Rfl: 2 .  TRESIBA FLEXTOUCH 100 UNIT/ML SOPN FlexTouch Pen, INJECT 40 UNITS INTO THE SKIN DAILY AT 10 PM., Disp: 15 pen, Rfl: 3 .  VICTOZA 18 MG/3ML SOPN, INJECT 0.3 MLS (1.8 MG TOTAL) INTO THE SKIN DAILY., Disp: 27 pen, Rfl: 1 .  cyclobenzaprine (FLEXERIL) 10 MG tablet, Take 1 tablet (10 mg total) by mouth 3 (three) times daily as needed for muscle spasms. Must last 30 days., Disp: 20 tablet, Rfl: 0  ROS  Constitutional: Denies any fever or chills Gastrointestinal: No reported hemesis, hematochezia, vomiting, or acute GI distress Musculoskeletal: Denies any acute onset joint swelling, redness, loss of ROM, or weakness Neurological: No reported episodes of acute onset apraxia, aphasia, dysarthria, agnosia, amnesia, paralysis, loss of coordination, or loss of consciousness  Allergies  Mr. Henricks is allergic to  contrast media [iodinated diagnostic agents]; iodine; and shellfish allergy.  [place]  Drug: Mr. Delange  reports that he does not use drugs. Alcohol:  reports that he does not drink alcohol. Tobacco:  reports that  he has quit smoking. he has never used smokeless tobacco. Medical:  has a past medical history of Acute postoperative pain (11/24/2016), Anxiety, Chronic hip pain (Right) (12/05/2014), Chronic lumbar pain, Depression, Hyperlipidemia, Hypertension, Kidney stones, and Migraines. Surgical: Mr. Langenderfer  has a past surgical history that includes Total hip arthroplasty; Tonsillectomy; and right hip surgery. Family: family history includes Cancer in his mother; Heart disease in his father; Stroke in his father.  Constitutional Exam  General appearance: Well nourished, well developed, and well hydrated. In no apparent acute distress Vitals:   04/19/17 0815  BP: (!) 176/83  Pulse: 89  Resp: 18  Temp: 98.3 F (36.8 C)  TempSrc: Oral  SpO2: 96%  Weight: 250 lb (113.4 kg)  Height: 6' 1"  (1.854 m)   BMI Assessment: Estimated body mass index is 32.98 kg/m as calculated from the following:   Height as of this encounter: 6' 1"  (1.854 m).   Weight as of this encounter: 250 lb (113.4 kg).  BMI interpretation table: BMI level Category Range association with higher incidence of chronic pain  <18 kg/m2 Underweight   18.5-24.9 kg/m2 Ideal body weight   25-29.9 kg/m2 Overweight Increased incidence by 20%  30-34.9 kg/m2 Obese (Class I) Increased incidence by 68%  35-39.9 kg/m2 Severe obesity (Class II) Increased incidence by 136%  >40 kg/m2 Extreme obesity (Class III) Increased incidence by 254%   BMI Readings from Last 4 Encounters:  04/19/17 32.98 kg/m  03/16/17 35.25 kg/m  02/23/17 32.98 kg/m  02/16/17 35.23 kg/m   Wt Readings from Last 4 Encounters:  04/19/17 250 lb (113.4 kg)  03/16/17 267 lb 3.2 oz (121.2 kg)  02/23/17 250 lb (113.4 kg)  02/16/17 267 lb (121.1 kg)  Psych/Mental status: Alert, oriented x 3 (person, place, & time)       Eyes: PERLA Respiratory: No evidence of acute respiratory distress  Cervical Spine Area Exam  Skin & Axial Inspection: No masses, redness, edema,  swelling, or associated skin lesions.  The patient comes in today using sunglasses, apparently due to some degree of photophobia. Alignment: Symmetrical Functional ROM: Unrestricted ROM      Stability: No instability detected Muscle Tone/Strength: Functionally intact. No obvious neuro-muscular anomalies detected. Sensory (Neurological): Unimpaired Palpation: No palpable anomalies              Upper Extremity (UE) Exam    Side: Right upper extremity  Side: Left upper extremity  Skin & Extremity Inspection: Skin color, temperature, and hair growth are WNL. No peripheral edema or cyanosis. No masses, redness, swelling, asymmetry, or associated skin lesions. No contractures.  Skin & Extremity Inspection: Skin color, temperature, and hair growth are WNL. No peripheral edema or cyanosis. No masses, redness, swelling, asymmetry, or associated skin lesions. No contractures.  Functional ROM: Unrestricted ROM          Functional ROM: Unrestricted ROM          Muscle Tone/Strength: Functionally intact. No obvious neuro-muscular anomalies detected.  Muscle Tone/Strength: Functionally intact. No obvious neuro-muscular anomalies detected.  Sensory (Neurological): Unimpaired          Sensory (Neurological): Unimpaired          Palpation: No palpable anomalies              Palpation: No  palpable anomalies              Specialized Test(s): Deferred         Specialized Test(s): Deferred          Thoracic Spine Area Exam  Skin & Axial Inspection: No masses, redness, or swelling Alignment: Symmetrical Functional ROM: Unrestricted ROM Stability: No instability detected Muscle Tone/Strength: Functionally intact. No obvious neuro-muscular anomalies detected. Sensory (Neurological): Unimpaired Muscle strength & Tone: No palpable anomalies  Lumbar Spine Area Exam  Skin & Axial Inspection: No masses, redness, or swelling Alignment: Symmetrical Functional ROM: Decreased ROM      Stability: No instability  detected Muscle Tone/Strength: Functionally intact. No obvious neuro-muscular anomalies detected. Sensory (Neurological): Movement-associated discomfort Palpation: No palpable anomalies       Provocative Tests: Lumbar Hyperextension and rotation test: evaluation deferred today       Lumbar Lateral bending test: evaluation deferred today       Patrick's Maneuver: evaluation deferred today                    Gait & Posture Assessment  Ambulation: Patient ambulates using a cane Gait: Antalgic Posture: Antalgic   Lower Extremity Exam    Side: Right lower extremity  Side: Left lower extremity  Skin & Extremity Inspection: Skin color, temperature, and hair growth are WNL. No peripheral edema or cyanosis. No masses, redness, swelling, asymmetry, or associated skin lesions. No contractures.  Skin & Extremity Inspection: Skin color, temperature, and hair growth are WNL. No peripheral edema or cyanosis. No masses, redness, swelling, asymmetry, or associated skin lesions. No contractures.  Functional ROM: Unrestricted ROM          Functional ROM: Unrestricted ROM          Muscle Tone/Strength: Functionally intact. No obvious neuro-muscular anomalies detected.  Muscle Tone/Strength: Functionally intact. No obvious neuro-muscular anomalies detected.  Sensory (Neurological): Unimpaired  Sensory (Neurological): Unimpaired  Palpation: No palpable anomalies  Palpation: No palpable anomalies   Assessment  Primary Diagnosis & Pertinent Problem List: The primary encounter diagnosis was Chronic low back pain (Primary Source of Pain) (Bilateral) (R>L). Diagnoses of Chronic lower extremity pain (Secondary source of pain) (Bilateral) (R>L), Coccygodynia, DDD (degenerative disc disease), lumbar, Chronic musculoskeletal pain, Cervicogenic headache (Left), and Chronic pain syndrome were also pertinent to this visit.  Status Diagnosis  Controlled Controlled Controlled 1. Chronic low back pain (Primary Source of  Pain) (Bilateral) (R>L)   2. Chronic lower extremity pain (Secondary source of pain) (Bilateral) (R>L)   3. Coccygodynia   4. DDD (degenerative disc disease), lumbar   5. Chronic musculoskeletal pain   6. Cervicogenic headache (Left)   7. Chronic pain syndrome     Problems updated and reviewed during this visit: Problem  Coccygodynia  Ddd (Degenerative Disc Disease), Lumbar  Chronic Musculoskeletal Pain   Plan of Care  Pharmacotherapy (Medications Ordered): Meds ordered this encounter  Medications  . cyclobenzaprine (FLEXERIL) 10 MG tablet    Sig: Take 1 tablet (10 mg total) by mouth 3 (three) times daily as needed for muscle spasms. Must last 30 days.    Dispense:  20 tablet    Refill:  0    Do not place this medication, or any other prescription from our practice, on "Automatic Refill". Patient may have prescription filled one day early if pharmacy is closed on scheduled refill date. No refills.   Medications administered today: Karmen Stabs. Minar had no medications administered during this visit.  Procedure Orders     Caudal Epidural Injection Lab Orders  No laboratory test(s) ordered today   Imaging Orders  No imaging studies ordered today   Referral Orders  No referral(s) requested today    Interventional management options: Planned, scheduled, and/or pending:   Therapeutic caudal epidural steroid injection under fluoroscopic guidance and IV sedation   Considering:   Palliative bilateral lumbar facet RFA (Right done on 02/17/16 & 04/30/15)  Diagnostic Left GONB  Possible Left GON RFA  Palliative Left CESI    Palliative PRN treatment(s):   None at this time   Provider-requested follow-up: Return for Procedure (w/ sedation): Caudal ESI w/ Dr. Dossie Arbour. Med-Mgmt w/ Dionisio David, NP.  Future Appointments  Date Time Provider [place]  04/27/2017  2:00 PM Jonathon Bellows, MD [facility] None  09/27/2017  8:30 AM [name] [name], MD [facility]   Primary  Care Physician: [name], MD Location: [facility] Facility Note by: Gaspar Cola, MD Date: 04/19/2017; Time: 8:43 AM

## 2017-04-19 ENCOUNTER — Other Ambulatory Visit: Payer: Self-pay

## 2017-04-19 ENCOUNTER — Ambulatory Visit: Payer: Medicare Other | Attending: Pain Medicine | Admitting: Pain Medicine

## 2017-04-19 ENCOUNTER — Encounter: Payer: Self-pay | Admitting: Pain Medicine

## 2017-04-19 VITALS — BP 176/83 | HR 89 | Temp 98.3°F | Resp 18 | Ht 73.0 in | Wt 250.0 lb

## 2017-04-19 DIAGNOSIS — M5442 Lumbago with sciatica, left side: Secondary | ICD-10-CM

## 2017-04-19 DIAGNOSIS — J309 Allergic rhinitis, unspecified: Secondary | ICD-10-CM | POA: Diagnosis not present

## 2017-04-19 DIAGNOSIS — Z91013 Allergy to seafood: Secondary | ICD-10-CM | POA: Insufficient documentation

## 2017-04-19 DIAGNOSIS — G8220 Paraplegia, unspecified: Secondary | ICD-10-CM | POA: Insufficient documentation

## 2017-04-19 DIAGNOSIS — M25512 Pain in left shoulder: Secondary | ICD-10-CM | POA: Insufficient documentation

## 2017-04-19 DIAGNOSIS — M79605 Pain in left leg: Secondary | ICD-10-CM | POA: Diagnosis not present

## 2017-04-19 DIAGNOSIS — K219 Gastro-esophageal reflux disease without esophagitis: Secondary | ICD-10-CM | POA: Diagnosis not present

## 2017-04-19 DIAGNOSIS — R51 Headache: Secondary | ICD-10-CM | POA: Diagnosis not present

## 2017-04-19 DIAGNOSIS — Z9889 Other specified postprocedural states: Secondary | ICD-10-CM | POA: Insufficient documentation

## 2017-04-19 DIAGNOSIS — M25511 Pain in right shoulder: Secondary | ICD-10-CM | POA: Diagnosis not present

## 2017-04-19 DIAGNOSIS — M533 Sacrococcygeal disorders, not elsewhere classified: Secondary | ICD-10-CM | POA: Insufficient documentation

## 2017-04-19 DIAGNOSIS — M79604 Pain in right leg: Secondary | ICD-10-CM | POA: Diagnosis not present

## 2017-04-19 DIAGNOSIS — G894 Chronic pain syndrome: Secondary | ICD-10-CM | POA: Diagnosis not present

## 2017-04-19 DIAGNOSIS — M5481 Occipital neuralgia: Secondary | ICD-10-CM | POA: Insufficient documentation

## 2017-04-19 DIAGNOSIS — M7918 Myalgia, other site: Secondary | ICD-10-CM | POA: Diagnosis not present

## 2017-04-19 DIAGNOSIS — R1030 Lower abdominal pain, unspecified: Secondary | ICD-10-CM | POA: Diagnosis not present

## 2017-04-19 DIAGNOSIS — Z79899 Other long term (current) drug therapy: Secondary | ICD-10-CM | POA: Diagnosis not present

## 2017-04-19 DIAGNOSIS — E1142 Type 2 diabetes mellitus with diabetic polyneuropathy: Secondary | ICD-10-CM | POA: Diagnosis not present

## 2017-04-19 DIAGNOSIS — Z7982 Long term (current) use of aspirin: Secondary | ICD-10-CM | POA: Insufficient documentation

## 2017-04-19 DIAGNOSIS — R42 Dizziness and giddiness: Secondary | ICD-10-CM | POA: Insufficient documentation

## 2017-04-19 DIAGNOSIS — Z8249 Family history of ischemic heart disease and other diseases of the circulatory system: Secondary | ICD-10-CM | POA: Insufficient documentation

## 2017-04-19 DIAGNOSIS — Z809 Family history of malignant neoplasm, unspecified: Secondary | ICD-10-CM | POA: Insufficient documentation

## 2017-04-19 DIAGNOSIS — G4486 Cervicogenic headache: Secondary | ICD-10-CM

## 2017-04-19 DIAGNOSIS — Z87891 Personal history of nicotine dependence: Secondary | ICD-10-CM | POA: Insufficient documentation

## 2017-04-19 DIAGNOSIS — M51369 Other intervertebral disc degeneration, lumbar region without mention of lumbar back pain or lower extremity pain: Secondary | ICD-10-CM | POA: Insufficient documentation

## 2017-04-19 DIAGNOSIS — M5136 Other intervertebral disc degeneration, lumbar region: Secondary | ICD-10-CM

## 2017-04-19 DIAGNOSIS — F419 Anxiety disorder, unspecified: Secondary | ICD-10-CM | POA: Insufficient documentation

## 2017-04-19 DIAGNOSIS — M479 Spondylosis, unspecified: Secondary | ICD-10-CM | POA: Insufficient documentation

## 2017-04-19 DIAGNOSIS — G8929 Other chronic pain: Secondary | ICD-10-CM

## 2017-04-19 DIAGNOSIS — M5116 Intervertebral disc disorders with radiculopathy, lumbar region: Secondary | ICD-10-CM | POA: Insufficient documentation

## 2017-04-19 DIAGNOSIS — Z91041 Radiographic dye allergy status: Secondary | ICD-10-CM | POA: Insufficient documentation

## 2017-04-19 DIAGNOSIS — I9589 Other hypotension: Secondary | ICD-10-CM | POA: Insufficient documentation

## 2017-04-19 DIAGNOSIS — I129 Hypertensive chronic kidney disease with stage 1 through stage 4 chronic kidney disease, or unspecified chronic kidney disease: Secondary | ICD-10-CM | POA: Diagnosis not present

## 2017-04-19 DIAGNOSIS — Z87442 Personal history of urinary calculi: Secondary | ICD-10-CM | POA: Insufficient documentation

## 2017-04-19 DIAGNOSIS — Z96641 Presence of right artificial hip joint: Secondary | ICD-10-CM | POA: Insufficient documentation

## 2017-04-19 DIAGNOSIS — M5441 Lumbago with sciatica, right side: Secondary | ICD-10-CM

## 2017-04-19 DIAGNOSIS — N400 Enlarged prostate without lower urinary tract symptoms: Secondary | ICD-10-CM | POA: Diagnosis not present

## 2017-04-19 DIAGNOSIS — F329 Major depressive disorder, single episode, unspecified: Secondary | ICD-10-CM | POA: Insufficient documentation

## 2017-04-19 DIAGNOSIS — E785 Hyperlipidemia, unspecified: Secondary | ICD-10-CM | POA: Diagnosis not present

## 2017-04-19 DIAGNOSIS — Z823 Family history of stroke: Secondary | ICD-10-CM | POA: Insufficient documentation

## 2017-04-19 DIAGNOSIS — G8918 Other acute postprocedural pain: Secondary | ICD-10-CM | POA: Insufficient documentation

## 2017-04-19 DIAGNOSIS — Z888 Allergy status to other drugs, medicaments and biological substances status: Secondary | ICD-10-CM | POA: Insufficient documentation

## 2017-04-19 DIAGNOSIS — Z794 Long term (current) use of insulin: Secondary | ICD-10-CM | POA: Insufficient documentation

## 2017-04-19 MED ORDER — GLUCOSE BLOOD VI STRP: ORAL_STRIP | 6 refills | Status: DC

## 2017-04-19 MED ORDER — CYCLOBENZAPRINE HCL 10 MG PO TABS: 10.0000 mg | ORAL_TABLET | Freq: Three times a day (TID) | ORAL | 0 refills | Status: DC | PRN

## 2017-04-19 NOTE — Progress Notes (Signed)
Safety precautions to be maintained throughout the outpatient stay will include: orient to surroundings, keep bed in low position, maintain call bell within reach at all times, provide assistance with transfer out of bed and ambulation.  

## 2017-04-19 NOTE — Patient Instructions (Addendum)
____________________________________________________________________________________________  Pain Scale  Introduction: The pain score used by this practice is the Verbal Numerical Rating Scale (VNRS-11). This is an 11-point scale. It is for adults and children 10 years or older. There are significant differences in how the pain score is reported, used, and applied. Forget everything you learned in the past and learn this scoring system.  General Information: The scale should reflect your current level of pain. Unless you are specifically asked for the level of your worst pain, or your average pain. If you are asked for one of these two, then it should be understood that it is over the past 24 hours.  Basic Activities of Daily Living (ADL): Personal hygiene, dressing, eating, transferring, and using restroom.  Instructions: Most patients tend to report their level of pain as a combination of two factors, their physical pain and their psychosocial pain. This last one is also known as "suffering" and it is reflection of how physical pain affects you socially and psychologically. From now on, report them separately. From this point on, when asked to report your pain level, report only your physical pain. Use the following table for reference.  Pain Clinic Pain Levels (0-5/10)  Pain Level Score  Description  No Pain 0   Mild pain 1 Nagging, annoying, but does not interfere with basic activities of daily living (ADL). Patients are able to eat, bathe, get dressed, toileting (being able to get on and off the toilet and perform personal hygiene functions), transfer (move in and out of bed or a chair without assistance), and maintain continence (able to control bladder and bowel functions). Blood pressure and heart rate are unaffected. A normal heart rate for a healthy adult ranges from 60 to 100 bpm (beats per minute).   Mild to moderate pain 2 Noticeable and distracting. Impossible to hide from other  people. More frequent flare-ups. Still possible to adapt and function close to normal. It can be very annoying and may have occasional stronger flare-ups. With discipline, patients may get used to it and adapt.   Moderate pain 3 Interferes significantly with activities of daily living (ADL). It becomes difficult to feed, bathe, get dressed, get on and off the toilet or to perform personal hygiene functions. Difficult to get in and out of bed or a chair without assistance. Very distracting. With effort, it can be ignored when deeply involved in activities.   Moderately severe pain 4 Impossible to ignore for more than a few minutes. With effort, patients may still be able to manage work or participate in some social activities. Very difficult to concentrate. Signs of autonomic nervous system discharge are evident: dilated pupils (mydriasis); mild sweating (diaphoresis); sleep interference. Heart rate becomes elevated (>115 bpm). Diastolic blood pressure (lower number) rises above 100 mmHg. Patients find relief in laying down and not moving.   Severe pain 5 Intense and extremely unpleasant. Associated with frowning face and frequent crying. Pain overwhelms the senses.  Ability to do any activity or maintain social relationships becomes significantly limited. Conversation becomes difficult. Pacing back and forth is common, as getting into a comfortable position is nearly impossible. Pain wakes you up from deep sleep. Physical signs will be obvious: pupillary dilation; increased sweating; goosebumps; brisk reflexes; cold, clammy hands and feet; nausea, vomiting or dry heaves; loss of appetite; significant sleep disturbance with inability to fall asleep or to remain asleep. When persistent, significant weight loss is observed due to the complete loss of appetite and sleep deprivation.  Blood   pressure and heart rate becomes significantly elevated. Caution: If elevated blood pressure triggers a pounding headache  associated with blurred vision, then the patient should immediately seek attention at an urgent or emergency care unit, as these may be signs of an impending stroke.    Emergency Department Pain Levels (6-10/10)  Emergency Room Pain 6 Severely limiting. Requires emergency care and should not be seen or managed at an outpatient pain management facility. Communication becomes difficult and requires great effort. Assistance to reach the emergency department may be required. Facial flushing and profuse sweating along with potentially dangerous increases in heart rate and blood pressure will be evident.   Distressing pain 7 Self-care is very difficult. Assistance is required to transport, or use restroom. Assistance to reach the emergency department will be required. Tasks requiring coordination, such as bathing and getting dressed become very difficult.   Disabling pain 8 Self-care is no longer possible. At this level, pain is disabling. The individual is unable to do even the most "basic" activities such as walking, eating, bathing, dressing, transferring to a bed, or toileting. Fine motor skills are lost. It is difficult to think clearly.   Incapacitating pain 9 Pain becomes incapacitating. Thought processing is no longer possible. Difficult to remember your own name. Control of movement and coordination are lost.   The worst pain imaginable 10 At this level, most patients pass out from pain. When this level is reached, collapse of the autonomic nervous system occurs, leading to a sudden drop in blood pressure and heart rate. This in turn results in a temporary and dramatic drop in blood flow to the brain, leading to a loss of consciousness. Fainting is one of the body's self defense mechanisms. Passing out puts the brain in a calmed state and causes it to shut down for a while, in order to begin the healing process.    Summary: 1. Refer to this scale when providing us with your pain level. 2. Be  accurate and careful when reporting your pain level. This will help with your care. 3. Over-reporting your pain level will lead to loss of credibility. 4. Even a level of 1/10 means that there is pain and will be treated at our facility. 5. High, inaccurate reporting will be documented as "Symptom Exaggeration", leading to loss of credibility and suspicions of possible secondary gains such as obtaining more narcotics, or wanting to appear disabled, for fraudulent reasons. 6. Only pain levels of 5 or below will be seen at our facility. 7. Pain levels of 6 and above will be sent to the Emergency Department and the appointment cancelled. ____________________________________________________________________________________________   ____________________________________________________________________________________________  Preparing for Procedure with Sedation  Instructions: . Oral Intake: Do not eat or drink anything for at least 8 hours prior to your procedure. . Transportation: Public transportation is not allowed. Bring an adult driver. The driver must be physically present in our waiting room before any procedure can be started. . Physical Assistance: Bring an adult physically capable of assisting you, in the event you need help. This adult should keep you company at home for at least 6 hours after the procedure. . Blood Pressure Medicine: Take your blood pressure medicine with a sip of water the morning of the procedure. . Blood thinners:  . Diabetics on insulin: Notify the staff so that you can be scheduled 1st case in the morning. If your diabetes requires high dose insulin, take only  of your normal insulin dose the morning of the procedure and   notify the staff that you have done so. . Preventing infections: Shower with an antibacterial soap the morning of your procedure. . Build-up your immune system: Take 1000 mg of Vitamin C with every meal (3 times a day) the day prior to your  procedure. Marland Kitchen Antibiotics: Inform the staff if you have a condition or reason that requires you to take antibiotics before dental procedures. . Pregnancy: If you are pregnant, call and cancel the procedure. . Sickness: If you have a cold, fever, or any active infections, call and cancel the procedure. . Arrival: You must be in the facility at least 30 minutes prior to your scheduled procedure. . Children: Do not bring children with you. . Dress appropriately: Bring dark clothing that you would not mind if they get stained. . Valuables: Do not bring any jewelry or valuables.  Procedure appointments are reserved for interventional treatments only. Marland Kitchen No Prescription Refills. . No medication changes will be discussed during procedure appointments. . No disability issues will be discussed.  Remember:  Regular Business hours are:  Monday to Thursday 8:00 AM to 4:00 PM  Provider's Schedule: Milinda Pointer, MD:  Procedure days: Tuesday and Thursday 7:30 AM to 4:00 PM  Gillis Santa, MD:  Procedure days: Monday and Wednesday 7:30 AM to 4:00 PM ____________________________________________________________________________________________   GENERAL RISKS AND COMPLICATIONS  What are the risk, side effects and possible complications? Generally speaking, most procedures are safe.  However, with any procedure there are risks, side effects, and the possibility of complications.  The risks and complications are dependent upon the sites that are lesioned, or the type of nerve block to be performed.  The closer the procedure is to the spine, the more serious the risks are.  Great care is taken when placing the radio frequency needles, block needles or lesioning probes, but sometimes complications can occur. 1. Infection: Any time there is an injection through the skin, there is a risk of infection.  This is why sterile conditions are used for these blocks.  There are four possible types of  infection. 1. Localized skin infection. 2. Central Nervous System Infection-This can be in the form of Meningitis, which can be deadly. 3. Epidural Infections-This can be in the form of an epidural abscess, which can cause pressure inside of the spine, causing compression of the spinal cord with subsequent paralysis. This would require an emergency surgery to decompress, and there are no guarantees that the patient would recover from the paralysis. 4. Discitis-This is an infection of the intervertebral discs.  It occurs in about 1% of discography procedures.  It is difficult to treat and it may lead to surgery.        2. Pain: the needles have to go through skin and soft tissues, will cause soreness.       3. Damage to internal structures:  The nerves to be lesioned may be near blood vessels or    other nerves which can be potentially damaged.       4. Bleeding: Bleeding is more common if the patient is taking blood thinners such as  aspirin, Coumadin, Ticiid, Plavix, etc., or if he/she have some genetic predisposition  such as hemophilia. Bleeding into the spinal canal can cause compression of the spinal  cord with subsequent paralysis.  This would require an emergency surgery to  decompress and there are no guarantees that the patient would recover from the  paralysis.       5. Pneumothorax:  Puncturing of a  lung is a possibility, every time a needle is introduced in  the area of the chest or upper back.  Pneumothorax refers to free air around the  collapsed lung(s), inside of the thoracic cavity (chest cavity).  Another two possible  complications related to a similar event would include: Hemothorax and Chylothorax.   These are variations of the Pneumothorax, where instead of air around the collapsed  lung(s), you may have blood or chyle, respectively.       6. Spinal headaches: They may occur with any procedures in the area of the spine.       7. Persistent CSF (Cerebro-Spinal Fluid) leakage: This is  a rare problem, but may occur  with prolonged intrathecal or epidural catheters either due to the formation of a fistulous  track or a dural tear.       8. Nerve damage: By working so close to the spinal cord, there is always a possibility of  nerve damage, which could be as serious as a permanent spinal cord injury with  paralysis.       9. Death:  Although rare, severe deadly allergic reactions known as "Anaphylactic  reaction" can occur to any of the medications used.      10. Worsening of the symptoms:  We can always make thing worse.  What are the chances of something like this happening? Chances of any of this occuring are extremely low.  By statistics, you have more of a chance of getting killed in a motor vehicle accident: while driving to the hospital than any of the above occurring .  Nevertheless, you should be aware that they are possibilities.  In general, it is similar to taking a shower.  Everybody knows that you can slip, hit your head and get killed.  Does that mean that you should not shower again?  Nevertheless always keep in mind that statistics do not mean anything if you happen to be on the wrong side of them.  Even if a procedure has a 1 (one) in a 1,000,000 (million) chance of going wrong, it you happen to be that one..Also, keep in mind that by statistics, you have more of a chance of having something go wrong when taking medications.  Who should not have this procedure? If you are on a blood thinning medication (e.g. Coumadin, Plavix, see list of "Blood Thinners"), or if you have an active infection going on, you should not have the procedure.  If you are taking any blood thinners, please inform your physician.  How should I prepare for this procedure?  Do not eat or drink anything at least six hours prior to the procedure.  Bring a driver with you .  It cannot be a taxi.  Come accompanied by an adult that can drive you back, and that is strong enough to help you if your  legs get weak or numb from the local anesthetic.  Take all of your medicines the morning of the procedure with just enough water to swallow them.  If you have diabetes, make sure that you are scheduled to have your procedure done first thing in the morning, whenever possible.  If you have diabetes, take only half of your insulin dose and notify our nurse that you have done so as soon as you arrive at the clinic.  If you are diabetic, but only take blood sugar pills (oral hypoglycemic), then do not take them on the morning of your procedure.  You may take them  after you have had the procedure.  Do not take aspirin or any aspirin-containing medications, at least eleven (11) days prior to the procedure.  They may prolong bleeding.  Wear loose fitting clothing that may be easy to take off and that you would not mind if it got stained with Betadine or blood.  Do not wear any jewelry or perfume  Remove any nail coloring.  It will interfere with some of our monitoring equipment.  NOTE: Remember that this is not meant to be interpreted as a complete list of all possible complications.  Unforeseen problems may occur.  BLOOD THINNERS The following drugs contain aspirin or other products, which can cause increased bleeding during surgery and should not be taken for 2 weeks prior to and 1 week after surgery.  If you should need take something for relief of minor pain, you may take acetaminophen which is found in Tylenol,m Datril, Anacin-3 and Panadol. It is not blood thinner. The products listed below are.  Do not take any of the products listed below in addition to any listed on your instruction sheet.  A.P.C or A.P.C with Codeine Codeine Phosphate Capsules #3 Ibuprofen Ridaura  ABC compound Congesprin Imuran rimadil  Advil Cope Indocin Robaxisal  Alka-Seltzer Effervescent Pain Reliever and Antacid Coricidin or Coricidin-D  Indomethacin Rufen  Alka-Seltzer plus Cold Medicine Cosprin Ketoprofen S-A-C  Tablets  Anacin Analgesic Tablets or Capsules Coumadin Korlgesic Salflex  Anacin Extra Strength Analgesic tablets or capsules CP-2 Tablets Lanoril Salicylate  Anaprox Cuprimine Capsules Levenox Salocol  Anexsia-D Dalteparin Magan Salsalate  Anodynos Darvon compound Magnesium Salicylate Sine-off  Ansaid Dasin Capsules Magsal Sodium Salicylate  Anturane Depen Capsules Marnal Soma  APF Arthritis pain formula Dewitt's Pills Measurin Stanback  Argesic Dia-Gesic Meclofenamic Sulfinpyrazone  Arthritis Bayer Timed Release Aspirin Diclofenac Meclomen Sulindac  Arthritis pain formula Anacin Dicumarol Medipren Supac  Analgesic (Safety coated) Arthralgen Diffunasal Mefanamic Suprofen  Arthritis Strength Bufferin Dihydrocodeine Mepro Compound Suprol  Arthropan liquid Dopirydamole Methcarbomol with Aspirin Synalgos  ASA tablets/Enseals Disalcid Micrainin Tagament  Ascriptin Doan's Midol Talwin  Ascriptin A/D Dolene Mobidin Tanderil  Ascriptin Extra Strength Dolobid Moblgesic Ticlid  Ascriptin with Codeine Doloprin or Doloprin with Codeine Momentum Tolectin  Asperbuf Duoprin Mono-gesic Trendar  Aspergum Duradyne Motrin or Motrin IB Triminicin  Aspirin plain, buffered or enteric coated Durasal Myochrisine Trigesic  Aspirin Suppositories Easprin Nalfon Trillsate  Aspirin with Codeine Ecotrin Regular or Extra Strength Naprosyn Uracel  Atromid-S Efficin Naproxen Ursinus  Auranofin Capsules Elmiron Neocylate Vanquish  Axotal Emagrin Norgesic Verin  Azathioprine Empirin or Empirin with Codeine Normiflo Vitamin E  Azolid Emprazil Nuprin Voltaren  Bayer Aspirin plain, buffered or children's or timed BC Tablets or powders Encaprin Orgaran Warfarin Sodium  Buff-a-Comp Enoxaparin Orudis Zorpin  Buff-a-Comp with Codeine Equegesic Os-Cal-Gesic   Buffaprin Excedrin plain, buffered or Extra Strength Oxalid   Bufferin Arthritis Strength Feldene Oxphenbutazone   Bufferin plain or Extra Strength Feldene Capsules  Oxycodone with Aspirin   Bufferin with Codeine Fenoprofen Fenoprofen Pabalate or Pabalate-SF   Buffets II Flogesic Panagesic   Buffinol plain or Extra Strength Florinal or Florinal with Codeine Panwarfarin   Buf-Tabs Flurbiprofen Penicillamine   Butalbital Compound Four-way cold tablets Penicillin   Butazolidin Fragmin Pepto-Bismol   Carbenicillin Geminisyn Percodan   Carna Arthritis Reliever Geopen Persantine   Carprofen Gold's salt Persistin   Chloramphenicol Goody's Phenylbutazone   Chloromycetin Haltrain Piroxlcam   Clmetidine heparin Plaquenil   Cllnoril Hyco-pap Ponstel   Clofibrate Hydroxy chloroquine Propoxyphen  Before stopping any of these medications, be sure to consult the physician who ordered them.  Some, such as Coumadin (Warfarin) are ordered to prevent or treat serious conditions such as "deep thrombosis", "pumonary embolisms", and other heart problems.  The amount of time that you may need off of the medication may also vary with the medication and the reason for which you were taking it.  If you are taking any of these medications, please make sure you notify your pain physician before you undergo any procedures.         Epidural Steroid Injection Patient Information  Description: The epidural space surrounds the nerves as they exit the spinal cord.  In some patients, the nerves can be compressed and inflamed by a bulging disc or a tight spinal canal (spinal stenosis).  By injecting steroids into the epidural space, we can bring irritated nerves into direct contact with a potentially helpful medication.  These steroids act directly on the irritated nerves and can reduce swelling and inflammation which often leads to decreased pain.  Epidural steroids may be injected anywhere along the spine and from the neck to the low back depending upon the location of your pain.   After numbing the skin with local anesthetic (like Novocaine), a small needle is passed into  the epidural space slowly.  You may experience a sensation of pressure while this is being done.  The entire block usually last less than 10 minutes.  Conditions which may be treated by epidural steroids:   Low back and leg pain  Neck and arm pain  Spinal stenosis  Post-laminectomy syndrome  Herpes zoster (shingles) pain  Pain from compression fractures  Preparation for the injection:  1. Do not eat any solid food or dairy products within 8 hours of your appointment.  2. You may drink clear liquids up to 3 hours before appointment.  Clear liquids include water, black coffee, juice or soda.  No milk or cream please. 3. You may take your regular medication, including pain medications, with a sip of water before your appointment  Diabetics should hold regular insulin (if taken separately) and take 1/2 normal NPH dos the morning of the procedure.  Carry some sugar containing items with you to your appointment. 4. A driver must accompany you and be prepared to drive you home after your procedure.  5. Bring all your current medications with your. 6. An IV may be inserted and sedation may be given at the discretion of the physician.   7. A blood pressure cuff, EKG and other monitors will often be applied during the procedure.  Some patients may need to have extra oxygen administered for a short period. 8. You will be asked to provide medical information, including your allergies, prior to the procedure.  We must know immediately if you are taking blood thinners (like Coumadin/Warfarin)  Or if you are allergic to IV iodine contrast (dye). We must know if you could possible be pregnant.  Possible side-effects:  Bleeding from needle site  Infection (rare, may require surgery)  Nerve injury (rare)  Numbness & tingling (temporary)  Difficulty urinating (rare, temporary)  Spinal headache ( a headache worse with upright posture)  Light -headedness (temporary)  Pain at injection site  (several days)  Decreased blood pressure (temporary)  Weakness in arm/leg (temporary)  Pressure sensation in back/neck (temporary)  Call if you experience:  Fever/chills associated with headache or increased back/neck pain.  Headache worsened by an upright position.  New onset  weakness or numbness of an extremity below the injection site  Hives or difficulty breathing (go to the emergency room)  Inflammation or drainage at the infection site  Severe back/neck pain  Any new symptoms which are concerning to you  Please note:  Although the local anesthetic injected can often make your back or neck feel good for several hours after the injection, the pain will likely return.  It takes 3-7 days for steroids to work in the epidural space.  You may not notice any pain relief for at least that one week.  If effective, we will often do a series of three injections spaced 3-6 weeks apart to maximally decrease your pain.  After the initial series, we generally will wait several months before considering a repeat injection of the same type.  If you have any questions, please call [phone] [facility]

## 2017-04-19 NOTE — Telephone Encounter (Signed)
Sent to pharmacy 

## 2017-04-21 ENCOUNTER — Telehealth: Payer: Self-pay | Admitting: Pharmacist

## 2017-04-21 NOTE — Telephone Encounter (Signed)
Called patient back in response to his message. He states that he called poison control for advice and issue has resolved.   States that CBG was 162 this morning and he accidentally gave himself 25 units of humalog instead of Tresiba around 6:30AM. Normal doses are Tresiba 40 units daily, humalog 10 units with meals. Per patient, poison control advised PO intake strategy and he states the lowest his CBG got was 92. Reports CBGs was 122 last check ~1 hour ago.   Patient denies further assistance at this time. Patient verbalizes understanding of hypoglycemia s/sx and management. Offered to schedule follow up with patient for "tune up" of diabetes but he declines at this time.   Carlean Jews, Pharm.D., BCPS PGY2 [facility] Resident Phone: [phone]

## 2017-04-21 NOTE — Telephone Encounter (Signed)
Noted.  Thank you for following up with him regarding this.

## 2017-04-25 ENCOUNTER — Other Ambulatory Visit: Payer: Self-pay | Admitting: Family Medicine

## 2017-04-25 MED ORDER — BLOOD GLUCOSE MONITOR KIT: PACK | 0 refills | Status: DC

## 2017-04-27 ENCOUNTER — Ambulatory Visit: Payer: Medicare Other | Admitting: Gastroenterology

## 2017-05-06 ENCOUNTER — Other Ambulatory Visit: Payer: Self-pay | Admitting: Family Medicine

## 2017-05-11 ENCOUNTER — Ambulatory Visit: Payer: Medicare Other | Admitting: Pain Medicine

## 2017-05-18 ENCOUNTER — Ambulatory Visit: Payer: Medicare Other | Attending: Nurse Practitioner | Admitting: Nurse Practitioner

## 2017-05-18 ENCOUNTER — Other Ambulatory Visit: Payer: Self-pay

## 2017-05-18 ENCOUNTER — Encounter: Payer: Self-pay | Admitting: Nurse Practitioner

## 2017-05-18 VITALS — BP 145/88 | HR 93 | Temp 97.8°F | Resp 18 | Ht 73.0 in | Wt 250.0 lb

## 2017-05-18 DIAGNOSIS — F329 Major depressive disorder, single episode, unspecified: Secondary | ICD-10-CM | POA: Insufficient documentation

## 2017-05-18 DIAGNOSIS — Z7982 Long term (current) use of aspirin: Secondary | ICD-10-CM | POA: Insufficient documentation

## 2017-05-18 DIAGNOSIS — M5412 Radiculopathy, cervical region: Secondary | ICD-10-CM

## 2017-05-18 DIAGNOSIS — M25561 Pain in right knee: Secondary | ICD-10-CM

## 2017-05-18 DIAGNOSIS — G8929 Other chronic pain: Secondary | ICD-10-CM | POA: Diagnosis not present

## 2017-05-18 DIAGNOSIS — M25562 Pain in left knee: Secondary | ICD-10-CM | POA: Diagnosis not present

## 2017-05-18 DIAGNOSIS — I9589 Other hypotension: Secondary | ICD-10-CM | POA: Insufficient documentation

## 2017-05-18 DIAGNOSIS — M479 Spondylosis, unspecified: Secondary | ICD-10-CM | POA: Insufficient documentation

## 2017-05-18 DIAGNOSIS — M25511 Pain in right shoulder: Secondary | ICD-10-CM | POA: Diagnosis not present

## 2017-05-18 DIAGNOSIS — M5116 Intervertebral disc disorders with radiculopathy, lumbar region: Secondary | ICD-10-CM | POA: Diagnosis not present

## 2017-05-18 DIAGNOSIS — G8220 Paraplegia, unspecified: Secondary | ICD-10-CM | POA: Diagnosis not present

## 2017-05-18 DIAGNOSIS — I129 Hypertensive chronic kidney disease with stage 1 through stage 4 chronic kidney disease, or unspecified chronic kidney disease: Secondary | ICD-10-CM | POA: Diagnosis not present

## 2017-05-18 DIAGNOSIS — M25512 Pain in left shoulder: Secondary | ICD-10-CM | POA: Insufficient documentation

## 2017-05-18 DIAGNOSIS — G4730 Sleep apnea, unspecified: Secondary | ICD-10-CM | POA: Insufficient documentation

## 2017-05-18 DIAGNOSIS — M545 Low back pain: Secondary | ICD-10-CM | POA: Insufficient documentation

## 2017-05-18 DIAGNOSIS — R51 Headache: Secondary | ICD-10-CM | POA: Diagnosis not present

## 2017-05-18 DIAGNOSIS — Z794 Long term (current) use of insulin: Secondary | ICD-10-CM | POA: Insufficient documentation

## 2017-05-18 DIAGNOSIS — K219 Gastro-esophageal reflux disease without esophagitis: Secondary | ICD-10-CM | POA: Insufficient documentation

## 2017-05-18 DIAGNOSIS — F419 Anxiety disorder, unspecified: Secondary | ICD-10-CM | POA: Diagnosis not present

## 2017-05-18 DIAGNOSIS — E1142 Type 2 diabetes mellitus with diabetic polyneuropathy: Secondary | ICD-10-CM | POA: Diagnosis not present

## 2017-05-18 DIAGNOSIS — G894 Chronic pain syndrome: Secondary | ICD-10-CM

## 2017-05-18 DIAGNOSIS — M79604 Pain in right leg: Secondary | ICD-10-CM | POA: Insufficient documentation

## 2017-05-18 DIAGNOSIS — Z79891 Long term (current) use of opiate analgesic: Secondary | ICD-10-CM

## 2017-05-18 DIAGNOSIS — G8918 Other acute postprocedural pain: Secondary | ICD-10-CM | POA: Insufficient documentation

## 2017-05-18 DIAGNOSIS — N400 Enlarged prostate without lower urinary tract symptoms: Secondary | ICD-10-CM | POA: Insufficient documentation

## 2017-05-18 DIAGNOSIS — Z79899 Other long term (current) drug therapy: Secondary | ICD-10-CM | POA: Insufficient documentation

## 2017-05-18 DIAGNOSIS — M7918 Myalgia, other site: Secondary | ICD-10-CM

## 2017-05-18 DIAGNOSIS — M47816 Spondylosis without myelopathy or radiculopathy, lumbar region: Secondary | ICD-10-CM

## 2017-05-18 DIAGNOSIS — M4726 Other spondylosis with radiculopathy, lumbar region: Secondary | ICD-10-CM | POA: Diagnosis not present

## 2017-05-18 DIAGNOSIS — R42 Dizziness and giddiness: Secondary | ICD-10-CM | POA: Diagnosis not present

## 2017-05-18 DIAGNOSIS — J309 Allergic rhinitis, unspecified: Secondary | ICD-10-CM | POA: Insufficient documentation

## 2017-05-18 DIAGNOSIS — M79605 Pain in left leg: Secondary | ICD-10-CM | POA: Insufficient documentation

## 2017-05-18 DIAGNOSIS — M5481 Occipital neuralgia: Secondary | ICD-10-CM | POA: Insufficient documentation

## 2017-05-18 DIAGNOSIS — Z87442 Personal history of urinary calculi: Secondary | ICD-10-CM | POA: Insufficient documentation

## 2017-05-18 DIAGNOSIS — Z87891 Personal history of nicotine dependence: Secondary | ICD-10-CM | POA: Insufficient documentation

## 2017-05-18 DIAGNOSIS — E785 Hyperlipidemia, unspecified: Secondary | ICD-10-CM | POA: Insufficient documentation

## 2017-05-18 MED ORDER — OXYCODONE HCL 10 MG PO TABS: 10.0000 mg | ORAL_TABLET | Freq: Four times a day (QID) | ORAL | 0 refills | Status: DC | PRN

## 2017-05-18 NOTE — Patient Instructions (Addendum)
________You have been given 3 scripts for oxycodone today. ____________________________________________________________________________________  Medication Rules  Applies to: All patients receiving prescriptions (written or electronic).  Pharmacy of record: Pharmacy where electronic prescriptions will be sent. If written prescriptions are taken to a different pharmacy, please inform the nursing staff. The pharmacy listed in the electronic medical record should be the one where you would like electronic prescriptions to be sent.  Prescription refills: Only during scheduled appointments. Applies to both, written and electronic prescriptions.  NOTE: The following applies primarily to controlled substances (Opioid* Pain Medications).   Patient's responsibilities: 1. Pain Pills: Bring all pain pills to every appointment (except for procedure appointments). 2. Pill Bottles: Bring pills in original pharmacy bottle. Always bring newest bottle. Bring bottle, even if empty. 3. Medication refills: You are responsible for knowing and keeping track of what medications you need refilled. The day before your appointment, write a list of all prescriptions that need to be refilled. Bring that list to your appointment and give it to the admitting nurse. Prescriptions will be written only during appointments. If you forget a medication, it will not be "Called in", "Faxed", or "electronically sent". You will need to get another appointment to get these prescribed. 4. Prescription Accuracy: You are responsible for carefully inspecting your prescriptions before leaving our office. Have the discharge nurse carefully go over each prescription with you, before taking them home. Make sure that your name is accurately spelled, that your address is correct. Check the name and dose of your medication to make sure it is accurate. Check the number of pills, and the written instructions to make sure they are clear and accurate.  Make sure that you are given enough medication to last until your next medication refill appointment. 5. Taking Medication: Take medication as prescribed. Never take more pills than instructed. Never take medication more frequently than prescribed. Taking less pills or less frequently is permitted and encouraged, when it comes to controlled substances (written prescriptions).  6. Inform other Doctors: Always inform, all of your healthcare providers, of all the medications you take. 7. Pain Medication from other Providers: You are not allowed to accept any additional pain medication from any other Doctor or Healthcare provider. There are two exceptions to this rule. (see below) In the event that you require additional pain medication, you are responsible for notifying us, as stated below. 8. Medication Agreement: You are responsible for carefully reading and following our Medication Agreement. This must be signed before receiving any prescriptions from our practice. Safely store a copy of your signed Agreement. Violations to the Agreement will result in no further prescriptions. (Additional copies of our Medication Agreement are available upon request.) 9. Laws, Rules, & Regulations: All patients are expected to follow all Federal and [employer], [employer], Rules, [employer]. Ignorance of the Laws does not constitute a valid excuse. The use of any illegal substances is prohibited. 10. Adopted CDC guidelines & recommendations: Target dosing levels will be at or below 60 MME/day. Use of benzodiazepines** is not recommended.  Exceptions: There are only two exceptions to the rule of not receiving pain medications from other Healthcare Providers. 1. Exception #1 (Emergencies): In the event of an emergency (i.e.: accident requiring emergency care), you are allowed to receive additional pain medication. However, you are responsible for: As soon as you are able, call our office [phone], at any time of the  day or night, and leave a message stating your name, the date and nature of the  emergency, and the name and dose of the medication prescribed. In the event that your call is answered by a member of our staff, make sure to document and save the date, time, and the name of the person that took your information.  2. Exception #2 (Planned Surgery): In the event that you are scheduled by another doctor or dentist to have any type of surgery or procedure, you are allowed (for a period no longer than 30 days), to receive additional pain medication, for the acute post-op pain. However, in this case, you are responsible for picking up a copy of our "Post-op Pain Management for Surgeons" handout, and giving it to your surgeon or dentist. This document is available at our office, and does not require an appointment to obtain it. Simply go to our office during business hours (Monday-Thursday from 8:00 AM to 4:00 PM) (Friday 8:00 AM to 12:00 Noon) or if you have a scheduled appointment with [place], prior to your surgery, and ask for it by name. In addition, you will need to provide [place] with your name, name of your surgeon, type of surgery, and date of procedure or surgery.  *Opioid medications include: morphine, codeine, oxycodone, oxymorphone, hydrocodone, hydromorphone, meperidine, tramadol, tapentadol, buprenorphine, fentanyl, methadone. **Benzodiazepine medications include: diazepam (Valium), alprazolam (Xanax), clonazepam (Klonopine), lorazepam (Ativan), clorazepate (Tranxene), chlordiazepoxide (Librium), estazolam (Prosom), oxazepam (Serax), temazepam (Restoril), triazolam (Halcion) (Last updated: 04/01/2017) ____________________________________________________________________________________________

## 2017-05-18 NOTE — Progress Notes (Signed)
Patient's Name: Alan Schmidt  MRN: [id number]  Referring Provider: [name], MD  DOB: 09-07-1945  PCP: Alan Haven, MD  DOS: 05/18/2017  Note by: Vevelyn Francois NP  Service setting: Ambulatory outpatient  Specialty: Interventional Pain Management  Location: [facility] (AMB) Pain Management Facility    Patient type: Established    Primary Reason(s) for Visit: Encounter for prescription drug management. (Level of risk: moderate)  CC: Back Pain (low)  HPI  Alan Schmidt is a 72 y.o. year old, male patient, who comes today for a medication management evaluation. He has Paraparesis ([place]); Chronic low back pain (Primary Source of Pain) (Bilateral) (R>L); Lumbar facet syndrome (Bilateral) (R>L); Lumbar spondylosis; Diabetic peripheral neuropathy ([place]); Long term current use of opiate analgesic; Long term prescription opiate use; Opiate use (60 MME/Day); Opiate dependence ([place]); Encounter for therapeutic drug level monitoring; Chronic neck pain (midline over the C7 spinous processes) (L>R); Neurogenic pain; Neuropathic pain; Contrast dye allergy; Chronic lower extremity pain (Secondary source of pain) (Bilateral) (R>L); 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) (Right L5 dermatome); History of total hip replacement (Right); Chronic hip pain (Right); Class I Morbid obesity (HCC) (68% higher incidence of chronic low back pain); Essential hypertension; GERD (gastroesophageal reflux disease); Obstructive sleep apnea; Hyperlipidemia; Chronic kidney disease (CKD); Abnormal nerve conduction studies (severe bilateral lower extremity polyneuropathy); Chronic shoulder pain (Bilateral) (R>L); Occipital neuralgia (Left); Cervicogenic headache (Left); Diabetes ([place]); BPH (benign prostatic hyperplasia); History of Vasovagal response to spinal injections; Allergic rhinitis; Anxiety and depression; Disturbance of skin sensation; Chronic shoulder radicular pain (Left); Chronic cervical radicular pain (Left);  Chronic pain syndrome; Opioid-induced constipation (OIC); History of postoperative nausea and vomiting; Nevus; Lumbar facet joint osteoarthritis (Bilateral); History of allergy to radiographic contrast media; Hypotension during surgery; Chronic headache; Chronic tension-type headache, intractable; Vertigo; Coccygodynia; DDD (degenerative disc disease), lumbar; Chronic musculoskeletal pain; and Chronic pain of both knees on their problem list. His primarily concern today is the Back Pain (low)  Pain Assessment: Location: Lower Back Radiating: radiates into right leg and groin Onset: More than a month ago Quality: Dull Severity: 3 /10 (self-reported pain score)  Note: Reported level is compatible with observation.                           Timing: Constant Modifying factors: procedures  Alan Schmidt was last scheduled for an appointment on 03/30/2017 for medication management. During today's appointment we reviewed Alan Schmidt's chronic pain status, as well as his outpatient medication regimen.  The patient  reports that he does not use drugs. His body mass index is 32.98 kg/m.  Further details on both, my assessment(s), as well as the proposed treatment plan, please see below.  Controlled Substance Pharmacotherapy Assessment REMS (Risk Evaluation and Mitigation Strategy)  Analgesic:Oxycodone IR 10 mg every 6 hours (40 mg/day) MME/day:60 mg/day.    Alan Shorter, RN  05/18/2017  1:24 PM  Signed Nursing Pain Medication Assessment:  Safety precautions to be maintained throughout the outpatient stay will include: orient to surroundings, keep bed in low position, maintain call bell within reach at all times, provide assistance with transfer out of bed and ambulation.  Medication Inspection Compliance: Pill count conducted under aseptic conditions, in front of the patient. Neither the pills nor the bottle was removed from the patient's sight at any time. Once count was completed pills were  immediately returned to the patient in their original bottle.  Medication: Oxycodone IR Pill/Patch Count:  59 of 120 pills remain Pill/Patch Appearance: Markings consistent with prescribed medication Bottle Appearance: Standard pharmacy container. Clearly labeled. Filled Date: 03 /29 / 2019 Last Medication intake:  Today   Pharmacokinetics: Liberation and absorption (onset of action): WNL Distribution (time to peak effect): WNL Metabolism and excretion (duration of action): WNL         Pharmacodynamics: Desired effects: Analgesia: Alan Schmidt reports >50% benefit. Functional ability: Patient reports that medication allows him to accomplish basic ADLs Clinically meaningful improvement in function (CMIF): Sustained CMIF goals met Perceived effectiveness: Described as relatively effective, allowing for increase in activities of daily living (ADL) Undesirable effects: Side-effects or Adverse reactions: None reported Monitoring: [place] PMP: Online review of the past 56-monthperiod conducted. Compliant with practice rules and regulations Last UDS on record: Summary  Date Value Ref Range Status  08/13/2016 FINAL  Final    Comment:    ==================================================================== TOXASSURE SELECT 13 (MW) ==================================================================== Test                             Result       Flag       Units Drug Present and Declared for Prescription Verification   Alprazolam                     32           EXPECTED   ng/mg creat   Alpha-hydroxyalprazolam        99           EXPECTED   ng/mg creat    Source of alprazolam is a scheduled prescription medication.    Alpha-hydroxyalprazolam is an expected metabolite of alprazolam.   Oxycodone                      664          EXPECTED   ng/mg creat   Oxymorphone                    74           EXPECTED   ng/mg creat   Noroxycodone                   1690         EXPECTED   ng/mg creat     Sources of oxycodone include scheduled prescription medications.    Oxymorphone and noroxycodone are expected metabolites of    oxycodone. Oxymorphone is also available as a scheduled    prescription medication. ==================================================================== Test                      Result    Flag   Units      Ref Range   Creatinine              155              mg/dL      >=20 ==================================================================== Declared Medications:  The flagging and interpretation on this report are based on the  following declared medications.  Unexpected results may arise from  inaccuracies in the declared medications.  **Note: The testing scope of this panel includes these medications:  Alprazolam  Oxycodone  **Note: The testing scope of this panel does not include following  reported medications:  Aspirin  Carvedilol  Cholecalciferol  Desoximetasone  Fluoxetine  Hydrochlorothiazide  Hydroxyzine  Insulin  Liraglutide  Lubiprostone  Magnesium Oxide  Metformin  Mometasone  Naloxone  Pravastatin  Pregabalin  Tamsulosin  Valsartan ==================================================================== For clinical consultation, please call [phone]. ====================================================================    UDS interpretation: Compliant          Medication Assessment Form: Reviewed. Patient indicates being compliant with therapy Treatment compliance: Compliant Risk Assessment Profile: Aberrant behavior: See prior evaluations. None observed or detected today Comorbid factors increasing risk of overdose: See prior notes. No additional risks detected today Risk of substance use disorder (SUD): Low  ORT Scoring interpretation table:  Score <3 = Low Risk for SUD  Score between 4-7 = Moderate Risk for SUD  Score >8 = High Risk for Opioid Abuse   Risk Mitigation Strategies:  Patient Counseling:  Covered Patient-Prescriber Agreement (PPA): Present and active  Notification to other healthcare providers: Done  Pharmacologic Plan: No change in therapy, at this time.             Laboratory Chemistry  Inflammation Markers (CRP: Acute Phase) (ESR: Chronic Phase) Lab Results  Component Value Date   CRP 0.5 03/06/2015   ESRSEDRATE 45 (H) 12/14/2016                         Rheumatology Markers No results found for: Elayne Guerin, [facility]                      Renal Function Markers Lab Results  Component Value Date   BUN 13 12/14/2016   CREATININE 0.89 12/14/2016   GFRAA >60 10/25/2016   GFRNONAA >60 10/25/2016                              Hepatic Function Markers Lab Results  Component Value Date   AST 25 09/25/2016   ALT 27 09/25/2016   ALBUMIN 3.9 09/25/2016   ALKPHOS 67 09/25/2016                        Electrolytes Lab Results  Component Value Date   NA 136 12/14/2016   K 4.3 12/14/2016   CL 100 12/14/2016   CALCIUM 9.1 12/14/2016   MG 1.8 03/06/2015                        Neuropathy Markers Lab Results  Component Value Date   HGBA1C 7.9 03/16/2017                        Bone Pathology Markers No results found for: [place], [id number], [id number], [id number], 25OHVITD1, 25OHVITD2, 25OHVITD3, TESTOFREE, TESTOSTERONE                       Coagulation Parameters Lab Results  Component Value Date   PLT 278 10/25/2016                        Cardiovascular Markers Lab Results  Component Value Date   HGB 13.8 10/25/2016   HCT 41.5 10/25/2016                         CA Markers No results found for: CEA, CA125, LABCA2                      Note:  Lab results reviewed.  Recent Diagnostic Imaging Results  DG C-Arm 1-60 Min-No Report Fluoroscopy was utilized by the requesting physician.  No radiographic  interpretation.   Complexity Note: Imaging results reviewed. Results shared with Mr. Grandville Silos, using Layman's terms.                          Meds   Current Outpatient Medications:  .  acetaminophen (TYLENOL) 500 MG tablet, Take 500 mg by mouth every 6 (six) hours as needed., Disp: , Rfl:  .  ALPRAZolam (XANAX) 0.5 MG tablet, Take 1 tablet (0.5 mg total) by mouth 2 (two) times daily as needed., Disp: 60 tablet, Rfl: 2 .  amLODipine (NORVASC) 5 MG tablet, Take 1 tablet (5 mg total) by mouth daily., Disp: 90 tablet, Rfl: 3 .  aspirin EC 325 MG tablet, Take 325 mg by mouth daily. , Disp: , Rfl:  .  BD PEN NEEDLE NANO U/F 32G X 4 MM MISC, USE EVERY DAY, Disp: 100 each, Rfl: 4 .  blood glucose meter kit and supplies KIT, Accu-Chek guide meter and Accu-Chek guide test strips with Accu-Chek fast clix lancets, check blood sugars 4 times daily, ICD 10 code E11.9, Disp: 1 each, Rfl: 0 .  carvedilol (COREG) 25 MG tablet, TAKE 1 TABLET BY MOUTH TWICE A DAY, Disp: 180 tablet, Rfl: 1 .  cyclobenzaprine (FLEXERIL) 10 MG tablet, Take 1 tablet (10 mg total) by mouth 3 (three) times daily as needed for muscle spasms. Must last 30 days., Disp: 20 tablet, Rfl: 0 .  desoximetasone (TOPICORT) 0.25 % cream, APPLY CREAM TO AFFECTED AREA TWO TIMES DAILY, Disp: , Rfl: 3 .  empagliflozin (JARDIANCE) 10 MG TABS tablet, Take 10 mg by mouth daily., Disp: 30 tablet, Rfl: 3 .  FLUoxetine (PROZAC) 40 MG capsule, TAKE ONE CAPSULE BY MOUTH TWO TIMES DAILY, Disp: 180 capsule, Rfl: 1 .  glucose blood (ACCU-CHEK COMPACT PLUS) test strip, USE 1 STRIP SEVEN TIMES A DAY, Disp: 204 each, Rfl: 6 .  hydrochlorothiazide (HYDRODIURIL) 25 MG tablet, TAKE 1 TABLET BY MOUTH EVERY DAY, Disp: 90 tablet, Rfl: 1 .  hydrOXYzine (ATARAX/VISTARIL) 50 MG tablet, TAKE 1 TABLET BY MOUTH FOUR TIMES A DAY AS NEEDED, Disp: 90 tablet, Rfl: 1 .  insulin lispro (HUMALOG KWIKPEN) 100 UNIT/ML KiwkPen, Inject 0.1 mLs (10 Units total) into the skin 3 (three) times daily with meals., Disp: 15 mL, Rfl: 3 .  loratadine (CLARITIN) 10 MG tablet, Take 1 tablet (10 mg total) by mouth  daily., Disp: 30 tablet, Rfl: 11 .  magnesium 30 MG tablet, Take 30 mg by mouth 2 (two) times daily., Disp: , Rfl:  .  metFORMIN (GLUCOPHAGE) 1000 MG tablet, TAKE 1 TABLET (1,000 MG TOTAL) BY MOUTH 2 (TWO) TIMES DAILY WITH A MEAL., Disp: 180 tablet, Rfl: 2 .  mometasone (NASONEX) 50 MCG/ACT nasal spray, Place 2 sprays into the nose daily., Disp: 17 g, Rfl: 12 .  naloxone (NARCAN) 2 MG/2ML injection, Inject content of syringe into thigh muscle. Call 911., Disp: 2 Syringe, Rfl: 1 .  olmesartan (BENICAR) 40 MG tablet, TAKE 1 TABLET BY MOUTH EVERY DAY, Disp: 90 tablet, Rfl: 1 .  pantoprazole (PROTONIX) 40 MG tablet, Take 1 tablet (40 mg total) by mouth daily., Disp: 30 tablet, Rfl: 3 .  pravastatin (PRAVACHOL) 40 MG tablet, TAKE 1 TABLET BY MOUTH EVERY DAY, Disp: 90 tablet, Rfl: 1 .  tamsulosin (FLOMAX) 0.4 MG CAPS capsule, TAKE 1 CAPSULE (0.4  MG TOTAL) BY MOUTH 2 (TWO) TIMES DAILY., Disp: 180 capsule, Rfl: 2 .  TRESIBA FLEXTOUCH 100 UNIT/ML SOPN FlexTouch Pen, INJECT 40 UNITS INTO THE SKIN DAILY AT 10 PM., Disp: 15 pen, Rfl: 3 .  VICTOZA 18 MG/3ML SOPN, INJECT 0.3 MLS (1.8 MG TOTAL) INTO THE SKIN DAILY., Disp: 27 pen, Rfl: 1 .  [START ON 07/29/2017] Oxycodone HCl 10 MG TABS, Take 1 tablet (10 mg total) by mouth every 6 (six) hours as needed., Disp: 120 tablet, Rfl: 0 .  [START ON 06/29/2017] Oxycodone HCl 10 MG TABS, Take 1 tablet (10 mg total) by mouth every 6 (six) hours as needed., Disp: 120 tablet, Rfl: 0 .  [START ON 05/30/2017] Oxycodone HCl 10 MG TABS, Take 1 tablet (10 mg total) by mouth every 6 (six) hours as needed., Disp: 120 tablet, Rfl: 0  ROS  Constitutional: Denies any fever or chills Gastrointestinal: No reported hemesis, hematochezia, vomiting, or acute GI distress Musculoskeletal: Denies any acute onset joint swelling, redness, loss of ROM, or weakness Neurological: No reported episodes of acute onset apraxia, aphasia, dysarthria, agnosia, amnesia, paralysis, loss of coordination, or  loss of consciousness  Allergies  Mr. Karel is allergic to contrast media [iodinated diagnostic agents]; iodine; and shellfish allergy.  [place]  Drug: Mr. Barringer  reports that he does not use drugs. Alcohol:  reports that he does not drink alcohol. Tobacco:  reports that he has quit smoking. He has never used smokeless tobacco. Medical:  has a past medical history of Acute postoperative pain (11/24/2016), Anxiety, Chronic hip pain (Right) (12/05/2014), Chronic lumbar pain, Depression, Hyperlipidemia, Hypertension, Kidney stones, and Migraines. Surgical: Mr. Pipe  has a past surgical history that includes Total hip arthroplasty; Tonsillectomy; and right hip surgery. Family: family history includes Cancer in his mother; Heart disease in his father; Stroke in his father.  Constitutional Exam  General appearance: Well nourished, well developed, and well hydrated. In no apparent acute distress Vitals:   05/18/17 1306  BP: (!) 145/88  Pulse: 93  Resp: 18  Temp: 97.8 F (36.6 C)  SpO2: 97%  Weight: 250 lb (113.4 kg)  Height: 6' 1" (1.854 m)   BMI Assessment: Estimated body mass index is 32.98 kg/m as calculated from the following:   Height as of this encounter: 6' 1" (1.854 m).   Weight as of this encounter: 250 lb (113.4 kg). Psych/Mental status: Alert, oriented x 3 (person, place, & time)       Eyes: PERLA Respiratory: No evidence of acute respiratory distress  Cervical Spine Area Exam  Skin & Axial Inspection: No masses, redness, edema, swelling, or associated skin lesions Alignment: Symmetrical Functional ROM: Unrestricted ROM      Stability: No instability detected Muscle Tone/Strength: Functionally intact. No obvious neuro-muscular anomalies detected. Sensory (Neurological): Unimpaired Palpation: No palpable anomalies              Upper Extremity (UE) Exam    Side: Right upper extremity  Side: Left upper extremity  Skin & Extremity Inspection: Skin color,  temperature, and hair growth are WNL. No peripheral edema or cyanosis. No masses, redness, swelling, asymmetry, or associated skin lesions. No contractures.  Skin & Extremity Inspection: Skin color, temperature, and hair growth are WNL. No peripheral edema or cyanosis. No masses, redness, swelling, asymmetry, or associated skin lesions. No contractures.  Functional ROM: Unrestricted ROM          Functional ROM: Unrestricted ROM          Muscle Tone/Strength:  Functionally intact. No obvious neuro-muscular anomalies detected.  Muscle Tone/Strength: Functionally intact. No obvious neuro-muscular anomalies detected.  Sensory (Neurological): Unimpaired          Sensory (Neurological): Unimpaired          Palpation: No palpable anomalies              Palpation: No palpable anomalies              Specialized Test(s): Deferred         Specialized Test(s): Deferred          Thoracic Spine Area Exam  Skin & Axial Inspection: No masses, redness, or swelling Alignment: Symmetrical Functional ROM: Unrestricted ROM Stability: No instability detected Muscle Tone/Strength: Functionally intact. No obvious neuro-muscular anomalies detected. Sensory (Neurological): Unimpaired Muscle strength & Tone: No palpable anomalies  Lumbar Spine Area Exam  Skin & Axial Inspection: No masses, redness, or swelling Alignment: Symmetrical Functional ROM: Unrestricted ROM      Stability: No instability detected Muscle Tone/Strength: Functionally intact. No obvious neuro-muscular anomalies detected. Sensory (Neurological): Unimpaired Palpation: Complains of area being tender to palpation       Provocative Tests: Lumbar Hyperextension and rotation test: evaluation deferred today       Lumbar Lateral bending test: evaluation deferred today       Patrick's Maneuver: evaluation deferred today                    Gait & Posture Assessment  Ambulation: Patient ambulates using a cane Gait: Relatively normal for age and body  habitus Posture: WNL   Lower Extremity Exam    Side: Right lower extremity  Side: Left lower extremity  Skin & Extremity Inspection: Skin color, temperature, and hair growth are WNL. No peripheral edema or cyanosis. No masses, redness, swelling, asymmetry, or associated skin lesions. No contractures.  Skin & Extremity Inspection: Skin color, temperature, and hair growth are WNL. No peripheral edema or cyanosis. No masses, redness, swelling, asymmetry, or associated skin lesions. No contractures.  Functional ROM: Unrestricted ROM          Functional ROM: Unrestricted ROM          Muscle Tone/Strength: Functionally intact. No obvious neuro-muscular anomalies detected.  Muscle Tone/Strength: Functionally intact. No obvious neuro-muscular anomalies detected.  Sensory (Neurological): Unimpaired  Sensory (Neurological): Unimpaired  Palpation: No palpable anomalies  Palpation: No palpable anomalies   Assessment  Primary Diagnosis & Pertinent Problem List: The primary encounter diagnosis was Lumbar spondylosis. Diagnoses of Lumbar facet syndrome (Bilateral) (R>L), Chronic musculoskeletal pain, Chronic cervical radicular pain (Left), Chronic pain syndrome, Long term prescription opiate use, and Chronic pain of both knees were also pertinent to this visit.  Status Diagnosis  Controlled Controlled Controlled 1. Lumbar spondylosis   2. Lumbar facet syndrome (Bilateral) (R>L)   3. Chronic musculoskeletal pain   4. Chronic cervical radicular pain (Left)   5. Chronic pain syndrome   6. Long term prescription opiate use   7. Chronic pain of both knees     Problems updated and reviewed during this visit: No problems updated. Plan of Care  Pharmacotherapy (Medications Ordered): Meds ordered this encounter  Medications  . Oxycodone HCl 10 MG TABS    Sig: Take 1 tablet (10 mg total) by mouth every 6 (six) hours as needed.    Dispense:  120 tablet    Refill:  0    Do not place this medication, or any  other prescription from our practice, on "  Automatic Refill". Patient may have prescription filled one day early if pharmacy is closed on scheduled refill date. Do not fill until: 07/29/2017 To last until:08/28/2017    Order Specific Question:   Supervising Provider    Answer:   Milinda Pointer [phone]  . Oxycodone HCl 10 MG TABS    Sig: Take 1 tablet (10 mg total) by mouth every 6 (six) hours as needed.    Dispense:  120 tablet    Refill:  0    Do not place this medication, or any other prescription from our practice, on "Automatic Refill". Patient may have prescription filled one day early if pharmacy is closed on scheduled refill date. Do not fill until: 06/29/2017 To last until: 07/29/2017    Order Specific Question:   Supervising Provider    Answer:   Milinda Pointer [phone]  . Oxycodone HCl 10 MG TABS    Sig: Take 1 tablet (10 mg total) by mouth every 6 (six) hours as needed.    Dispense:  120 tablet    Refill:  0    Do not place this medication, or any other prescription from our practice, on "Automatic Refill". Patient may have prescription filled one day early if pharmacy is closed on scheduled refill date. Do not fill until: 05/30/2017 To last until: 06/29/2017    Order Specific Question:   Supervising Provider    Answer:   Milinda Pointer [phone]   New Prescriptions   No medications on file   Medications administered today: Karmen Stabs. Vangilder had no medications administered during this visit. Lab-work, procedure(s), and/or referral(s): Orders Placed This Encounter  Procedures  . DG Knee 1-2 Views Left  . DG Knee 1-2 Views Right  . ToxASSURE Select 13 (MW), Urine  . Ambulatory referral to Physical Therapy   Imaging and/or referral(s): AMB REFERRAL TO PHYSICAL THERAPY DG KNEE 1-2 VIEWS LEFT DG KNEE 1-2 VIEWS RIGHT  Interventional therapies: Planned, scheduled, and/or pending:  Not at this time.   Considering:  Palliative bilateral lumbar facet RFA (Right  done on 02/17/16 & 04/30/15) Diagnostic Left GONB Possible Left GON RFA Palliative Left CESI   Palliative PRN treatment(s):  Diagnostic Left GONB      Provider-requested follow-up: Return in about 3 months (around 08/17/2017) for MedMgmt with Me Dionisio David).  Future Appointments  Date Time Provider [place]  08/17/2017  1:30 PM Vevelyn Francois, NP [facility] None  09/27/2017  8:30 AM [name] [name], MD [facility]   Primary Care Physician: Alan Haven, MD Location: [facility] Outpatient Pain Management Facility Note by: Vevelyn Francois NP Date: 05/18/2017; Time: 8:36 AM  Pain Score Disclaimer: We use the NRS-11 scale. This is a self-reported, subjective measurement of pain severity with only modest accuracy. It is used primarily to identify changes within a particular patient. It must be understood that outpatient pain scales are significantly less accurate that those used for research, where they can be applied under ideal controlled circumstances with minimal exposure to variables. In reality, the score is likely to be a combination of pain intensity and pain affect, where pain affect describes the degree of emotional arousal or changes in action readiness caused by the sensory experience of pain. Factors such as social and work situation, setting, emotional state, anxiety levels, expectation, and prior pain experience may influence pain perception and show large inter-individual differences that may also be affected by time variables.  Patient instructions provided during this appointment: Patient Instructions  ________You have been  given 3 scripts for oxycodone today. ____________________________________________________________________________________  Medication Rules  Applies to: All patients receiving prescriptions (written or electronic).  Pharmacy of record: Pharmacy where electronic prescriptions will be sent. If written prescriptions are taken to a  different pharmacy, please inform the nursing staff. The pharmacy listed in the electronic medical record should be the one where you would like electronic prescriptions to be sent.  Prescription refills: Only during scheduled appointments. Applies to both, written and electronic prescriptions.  NOTE: The following applies primarily to controlled substances (Opioid* Pain Medications).   Patient's responsibilities: 1. Pain Pills: Bring all pain pills to every appointment (except for procedure appointments). 2. Pill Bottles: Bring pills in original pharmacy bottle. Always bring newest bottle. Bring bottle, even if empty. 3. Medication refills: You are responsible for knowing and keeping track of what medications you need refilled. The day before your appointment, write a list of all prescriptions that need to be refilled. Bring that list to your appointment and give it to the admitting nurse. Prescriptions will be written only during appointments. If you forget a medication, it will not be "Called in", "Faxed", or "electronically sent". You will need to get another appointment to get these prescribed. 4. Prescription Accuracy: You are responsible for carefully inspecting your prescriptions before leaving our office. Have the discharge nurse carefully go over each prescription with you, before taking them home. Make sure that your name is accurately spelled, that your address is correct. Check the name and dose of your medication to make sure it is accurate. Check the number of pills, and the written instructions to make sure they are clear and accurate. Make sure that you are given enough medication to last until your next medication refill appointment. 5. Taking Medication: Take medication as prescribed. Never take more pills than instructed. Never take medication more frequently than prescribed. Taking less pills or less frequently is permitted and encouraged, when it comes to controlled substances  (written prescriptions).  6. Inform other Doctors: Always inform, all of your healthcare providers, of all the medications you take. 7. Pain Medication from other Providers: You are not allowed to accept any additional pain medication from any other Doctor or Healthcare provider. There are two exceptions to this rule. (see below) In the event that you require additional pain medication, you are responsible for notifying us, as stated below. 8. Medication Agreement: You are responsible for carefully reading and following our Medication Agreement. This must be signed before receiving any prescriptions from our practice. Safely store a copy of your signed Agreement. Violations to the Agreement will result in no further prescriptions. (Additional copies of our Medication Agreement are available upon request.) 9. Laws, Rules, & Regulations: All patients are expected to follow all Federal and [employer], [employer], Rules, [employer]. Ignorance of the Laws does not constitute a valid excuse. The use of any illegal substances is prohibited. 10. Adopted CDC guidelines & recommendations: Target dosing levels will be at or below 60 MME/day. Use of benzodiazepines** is not recommended.  Exceptions: There are only two exceptions to the rule of not receiving pain medications from other Healthcare Providers. 1. Exception #1 (Emergencies): In the event of an emergency (i.e.: accident requiring emergency care), you are allowed to receive additional pain medication. However, you are responsible for: As soon as you are able, call our office [phone], at any time of the day or night, and leave a message stating your name, the date and nature of the emergency, and the  name and dose of the medication prescribed. In the event that your call is answered by a member of our staff, make sure to document and save the date, time, and the name of the person that took your information.  2. Exception #2 (Planned Surgery): In the  event that you are scheduled by another doctor or dentist to have any type of surgery or procedure, you are allowed (for a period no longer than 30 days), to receive additional pain medication, for the acute post-op pain. However, in this case, you are responsible for picking up a copy of our "Post-op Pain Management for Surgeons" handout, and giving it to your surgeon or dentist. This document is available at our office, and does not require an appointment to obtain it. Simply go to our office during business hours (Monday-Thursday from 8:00 AM to 4:00 PM) (Friday 8:00 AM to 12:00 Noon) or if you have a scheduled appointment with [place], prior to your surgery, and ask for it by name. In addition, you will need to provide [place] with your name, name of your surgeon, type of surgery, and date of procedure or surgery.  *Opioid medications include: morphine, codeine, oxycodone, oxymorphone, hydrocodone, hydromorphone, meperidine, tramadol, tapentadol, buprenorphine, fentanyl, methadone. **Benzodiazepine medications include: diazepam (Valium), alprazolam (Xanax), clonazepam (Klonopine), lorazepam (Ativan), clorazepate (Tranxene), chlordiazepoxide (Librium), estazolam (Prosom), oxazepam (Serax), temazepam (Restoril), triazolam (Halcion) (Last updated: 04/01/2017) ____________________________________________________________________________________________

## 2017-05-18 NOTE — Progress Notes (Signed)
Nursing Pain Medication Assessment:  Safety precautions to be maintained throughout the outpatient stay will include: orient to surroundings, keep bed in low position, maintain call bell within reach at all times, provide assistance with transfer out of bed and ambulation.  Medication Inspection Compliance: Pill count conducted under aseptic conditions, in front of the patient. Neither the pills nor the bottle was removed from the patient's sight at any time. Once count was completed pills were immediately returned to the patient in their original bottle.  Medication: Oxycodone IR Pill/Patch Count: 59 of 120 pills remain Pill/Patch Appearance: Markings consistent with prescribed medication Bottle Appearance: Standard pharmacy container. Clearly labeled. Filled Date: 03 /29 / 2019 Last Medication intake:  Today

## 2017-05-22 LAB — TOXASSURE SELECT 13 (MW), URINE

## 2017-05-23 ENCOUNTER — Other Ambulatory Visit: Payer: Self-pay | Admitting: Family Medicine

## 2017-06-08 ENCOUNTER — Telehealth: Payer: Self-pay | Admitting: Family Medicine

## 2017-06-08 ENCOUNTER — Other Ambulatory Visit: Payer: Self-pay

## 2017-06-08 MED ORDER — BLOOD GLUCOSE MONITOR KIT: PACK | 0 refills | Status: DC

## 2017-06-08 NOTE — Telephone Encounter (Signed)
Copied from [place] [phone]. Topic: Quick Communication - See Telephone Encounter >> Jun 08, 2017 12:49 PM Aurelio Brash B wrote: CRM for notification. See Telephone encounter for: 06/08/17.  PT is requesting a accu check guide blood glucose meter   He states He has requested it several times over the past monthes and has not gotten it.     [id number] [address], [place] (Phone) [phone] (Fax)

## 2017-06-08 NOTE — Telephone Encounter (Signed)
Called and left voicemail for patient to return call to office about request for glucose meter.

## 2017-06-08 NOTE — Telephone Encounter (Signed)
Dr. [name] sent in a script for this in March to [facility] [address]. It has been resent today to [facility] [address].

## 2017-06-09 ENCOUNTER — Other Ambulatory Visit: Payer: Self-pay | Admitting: Family Medicine

## 2017-06-09 NOTE — Telephone Encounter (Signed)
Pt is calling - [facility] says [facility] [address] [facility] not [facility] the [facility] for the meter  cb is [phone]

## 2017-06-10 NOTE — Telephone Encounter (Signed)
Called and left voicemail for patient at number listed that [facility] the meter and to call the office if he has any further questions or concerns.

## 2017-06-15 DIAGNOSIS — L851 Acquired keratosis [keratoderma] palmaris et plantaris: Secondary | ICD-10-CM | POA: Diagnosis not present

## 2017-06-15 DIAGNOSIS — B351 Tinea unguium: Secondary | ICD-10-CM | POA: Diagnosis not present

## 2017-06-15 DIAGNOSIS — E1140 Type 2 diabetes mellitus with diabetic neuropathy, unspecified: Secondary | ICD-10-CM | POA: Diagnosis not present

## 2017-06-18 ENCOUNTER — Other Ambulatory Visit: Payer: Self-pay | Admitting: Family Medicine

## 2017-06-22 ENCOUNTER — Other Ambulatory Visit: Payer: Self-pay | Admitting: Family Medicine

## 2017-06-22 NOTE — Telephone Encounter (Signed)
Last OV 03/16/17 last filled 12/03/16 60 2rf

## 2017-06-29 ENCOUNTER — Other Ambulatory Visit: Payer: Self-pay | Admitting: Family Medicine

## 2017-07-08 ENCOUNTER — Other Ambulatory Visit: Payer: Self-pay

## 2017-07-11 ENCOUNTER — Other Ambulatory Visit: Payer: Self-pay | Admitting: Family Medicine

## 2017-07-12 ENCOUNTER — Other Ambulatory Visit: Payer: Self-pay | Admitting: Family Medicine

## 2017-07-16 ENCOUNTER — Other Ambulatory Visit: Payer: Self-pay | Admitting: Family Medicine

## 2017-07-19 ENCOUNTER — Other Ambulatory Visit: Payer: Self-pay | Admitting: Family Medicine

## 2017-07-24 ENCOUNTER — Other Ambulatory Visit: Payer: Self-pay | Admitting: Family Medicine

## 2017-07-26 ENCOUNTER — Telehealth: Payer: Self-pay | Admitting: Family Medicine

## 2017-07-26 NOTE — Telephone Encounter (Signed)
Copied from [place] [phone]. Topic: Quick Communication - Rx Refill/Question >> Jul 26, 2017  5:29 PM Selinda Flavin B, NT wrote: Medication: ACCU-CHEK GUIDE test strip  Has the patient contacted their pharmacy? Yes.   (Agent: If no, request that the patient contact the pharmacy for the refill.) (Agent: If yes, when and what did the pharmacy advise?)  Preferred Pharmacy (with phone number or street name): [id number] [address], [place]: Please be advised that RX refills may take up to 3 business days. We ask that you follow-up with your pharmacy.   **Patient has an Accu-Chek Guide meter. States that he needs the Accu-Chek Guide test strips. Please advise.

## 2017-07-27 NOTE — Telephone Encounter (Signed)
Pt needs Accu Check Guide test strips. [facility]  [address] In [place] was on 07/22/17 was for Accu-Chek Smartview test strips

## 2017-07-28 MED ORDER — GLUCOSE BLOOD VI STRP: ORAL_STRIP | 5 refills | Status: DC

## 2017-07-28 NOTE — Telephone Encounter (Signed)
Sent to pharmacy 

## 2017-08-12 ENCOUNTER — Other Ambulatory Visit: Payer: Self-pay | Admitting: Family Medicine

## 2017-08-17 ENCOUNTER — Encounter: Payer: Medicare Other | Admitting: Nurse Practitioner

## 2017-08-17 NOTE — Progress Notes (Deleted)
Patient's Name: Alan Schmidt  MRN: [id number]  Referring Provider: [name], MD  DOB: 1945-12-02  PCP: [name], MD  DOS: 08/17/2017  Note by: Vevelyn Francois NP  Service setting: Ambulatory outpatient  Specialty: Interventional Pain Management  Location: [facility] (AMB) Pain Management Facility    Patient type: Established    Primary Reason(s) for Visit: Encounter for prescription drug management. (Level of risk: moderate)  CC: No chief complaint on file.  HPI  Alan Schmidt is a 72 y.o. year old, male patient, who comes today for a medication management evaluation. He has Paraparesis ([place]); Chronic low back pain (Primary Source of Pain) (Bilateral) (R>L); Lumbar facet syndrome (Bilateral) (R>L); Lumbar spondylosis; Diabetic peripheral neuropathy ([place]); Long term current use of opiate analgesic; Long term prescription opiate use; Opiate use (60 MME/Day); Opiate dependence ([place]); Encounter for therapeutic drug level monitoring; Chronic neck pain (midline over the C7 spinous processes) (L>R); Neurogenic pain; Neuropathic pain; Contrast dye allergy; Chronic lower extremity pain (Secondary source of pain) (Bilateral) (R>L); 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) (Right L5 dermatome); History of total hip replacement (Right); Chronic hip pain (Right); Class I Morbid obesity (HCC) (68% higher incidence of chronic low back pain); Essential hypertension; GERD (gastroesophageal reflux disease); Obstructive sleep apnea; Hyperlipidemia; Chronic kidney disease (CKD); Abnormal nerve conduction studies (severe bilateral lower extremity polyneuropathy); Chronic shoulder pain (Bilateral) (R>L); Occipital neuralgia (Left); Cervicogenic headache (Left); Diabetes ([place]); BPH (benign prostatic hyperplasia); History of Vasovagal response to spinal injections; Allergic rhinitis; Anxiety and depression; Disturbance of skin sensation; Chronic shoulder radicular pain (Left); Chronic cervical radicular  pain (Left); Chronic pain syndrome; Opioid-induced constipation (OIC); History of postoperative nausea and vomiting; Nevus; Lumbar facet joint osteoarthritis (Bilateral); History of allergy to radiographic contrast media; Hypotension during surgery; Chronic headache; Chronic tension-type headache, intractable; Vertigo; Coccygodynia; DDD (degenerative disc disease), lumbar; Chronic musculoskeletal pain; and Chronic pain of both knees on their problem list. His primarily concern today is the No chief complaint on file.  Pain Assessment: Location:     Radiating:   Onset:   Duration:   Quality:   Severity:  /10 (subjective, self-reported pain score)  Note: Reported level is compatible with observation.                         When using our objective Pain Scale, levels between 6 and 10/10 are said to belong in an emergency room, as it progressively worsens from a 6/10, described as severely limiting, requiring emergency care not usually available at an outpatient pain management facility. At a 6/10 level, communication becomes difficult and requires great effort. Assistance to reach the emergency department may be required. Facial flushing and profuse sweating along with potentially dangerous increases in heart rate and blood pressure will be evident. Effect on ADL:   Timing:   Modifying factors:   BP:    HR:    Alan Schmidt was last scheduled for an appointment on 05/18/2017 for medication management. During today's appointment we reviewed Alan Schmidt's chronic pain status, as well as his outpatient medication regimen.  The patient  reports that he does not use drugs. His body mass index is unknown because there is no height or weight on file.  Further details on both, my assessment(s), as well as the proposed treatment plan, please see below.  Controlled Substance Pharmacotherapy Assessment REMS (Risk Evaluation and Mitigation Strategy)  Analgesic:Oxycodone IR 10 mg every 6 hours (40  mg/day) MME/day:60 mg/day.  No notes on file Pharmacokinetics: Liberation and absorption (onset of action): WNL Distribution (time to peak effect): WNL Metabolism and excretion (duration of action): WNL         Pharmacodynamics: Desired effects: Analgesia: Alan Schmidt reports >50% benefit. Functional ability: Patient reports that medication allows him to accomplish basic ADLs Clinically meaningful improvement in function (CMIF): Sustained CMIF goals met Perceived effectiveness: Described as relatively effective, allowing for increase in activities of daily living (ADL) Undesirable effects: Side-effects or Adverse reactions: None reported Monitoring: [place] PMP: Online review of the past 10-monthperiod conducted. Compliant with practice rules and regulations Last UDS on record: Summary  Date Value Ref Range Status  05/18/2017 FINAL  Final    Comment:    ==================================================================== TOXASSURE SELECT 13 (MW) ==================================================================== Test                             Result       Flag       Units Drug Present and Declared for Prescription Verification   Alprazolam                     39           EXPECTED   ng/mg creat   Alpha-hydroxyalprazolam        54           EXPECTED   ng/mg creat    Source of alprazolam is a scheduled prescription medication.    Alpha-hydroxyalprazolam is an expected metabolite of alprazolam.   Oxycodone                      3568         EXPECTED   ng/mg creat   Oxymorphone                    63           EXPECTED   ng/mg creat   Noroxycodone                   2463         EXPECTED   ng/mg creat    Sources of oxycodone include scheduled prescription medications.    Oxymorphone and noroxycodone are expected metabolites of    oxycodone. Oxymorphone is also available as a scheduled    prescription  medication. ==================================================================== Test                      Result    Flag   Units      Ref Range   Creatinine              98               mg/dL      >=20 ==================================================================== Declared Medications:  The flagging and interpretation on this report are based on the  following declared medications.  Unexpected results may arise from  inaccuracies in the declared medications.  **Note: The testing scope of this panel includes these medications:  Alprazolam  Oxycodone  **Note: The testing scope of this panel does not include following  reported medications:  Acetaminophen  Amlodipine  Aspirin  Carvedilol  Cyclobenzaprine  Desoximetasone  Empagliflozin  Fluoxetine  Hydrochlorothiazide (HCTZ)  Hydroxyzine  Insulin  Liraglutide (Victoza)  Loratadine  Magnesium  Metformin  Mometasone  Naloxone  Olmesartan  Pantoprazole  Pravastatin  Tamsulosin ====================================================================  For clinical consultation, please call [phone]. ====================================================================    UDS interpretation: Compliant          Medication Assessment Form: Reviewed. Patient indicates being compliant with therapy Treatment compliance: Compliant Risk Assessment Profile: Aberrant behavior: See prior evaluations. None observed or detected today Comorbid factors increasing risk of overdose: See prior notes. No additional risks detected today Risk of substance use disorder (SUD): Low  ORT Scoring interpretation table:  Score <3 = Low Risk for SUD  Score between 4-7 = Moderate Risk for SUD  Score >8 = High Risk for Opioid Abuse   Risk Mitigation Strategies:  Patient Counseling: Covered Patient-Prescriber Agreement (PPA): Present and active  Notification to other healthcare providers: Done  Pharmacologic Plan: No change in therapy, at  this time.             Laboratory Chemistry  Inflammation Markers (CRP: Acute Phase) (ESR: Chronic Phase) Lab Results  Component Value Date   CRP 0.5 03/06/2015   ESRSEDRATE 45 (H) 12/14/2016                         Rheumatology Markers No results found for: RF, ANA, LABURIC, URICUR, LYMEIGGIGMAB, LYMEABIGMQN, HLAB27                      Renal Function Markers Lab Results  Component Value Date   BUN 13 12/14/2016   CREATININE 0.89 12/14/2016   GFRAA >60 10/25/2016   GFRNONAA >60 10/25/2016                             Hepatic Function Markers Lab Results  Component Value Date   AST 25 09/25/2016   ALT 27 09/25/2016   ALBUMIN 3.9 09/25/2016   ALKPHOS 67 09/25/2016                        Electrolytes Lab Results  Component Value Date   NA 136 12/14/2016   K 4.3 12/14/2016   CL 100 12/14/2016   CALCIUM 9.1 12/14/2016   MG 1.8 03/06/2015                        Neuropathy Markers Lab Results  Component Value Date   HGBA1C 7.9 03/16/2017                        Bone Pathology Markers No results found for: [place], [id number], [id number], [id number], 25OHVITD1, 25OHVITD2, 25OHVITD3, TESTOFREE, TESTOSTERONE                       Coagulation Parameters Lab Results  Component Value Date   PLT 278 10/25/2016                        Cardiovascular Markers Lab Results  Component Value Date   HGB 13.8 10/25/2016   HCT 41.5 10/25/2016                         CA Markers No results found for: CEA, CA125, LABCA2                      Note: Lab results reviewed.  Recent Diagnostic Imaging Results  DG C-Arm 1-60 Min-No Report Fluoroscopy was utilized by the requesting physician.  No radiographic  interpretation.   Complexity Note: Imaging results reviewed. Results shared with Alan Schmidt, using Layman's terms.                         Meds   Current Outpatient Medications:  .  ACCU-CHEK FASTCLIX LANCETS MISC, CHECK 4 TIMES A DAY, Disp: 102 each, Rfl: 0 .   acetaminophen (TYLENOL) 500 MG tablet, Take 500 mg by mouth every 6 (six) hours as needed., Disp: , Rfl:  .  ALPRAZolam (XANAX) 0.5 MG tablet, TAKE 1 TABLET BY MOUTH TWICE A DAY AS NEEDED, Disp: 60 tablet, Rfl: 1 .  amLODipine (NORVASC) 5 MG tablet, Take 1 tablet (5 mg total) by mouth daily., Disp: 90 tablet, Rfl: 3 .  aspirin EC 325 MG tablet, Take 325 mg by mouth daily. , Disp: , Rfl:  .  BD PEN NEEDLE NANO U/F 32G X 4 MM MISC, USE EVERY DAY, Disp: 100 each, Rfl: 4 .  blood glucose meter kit and supplies KIT, Accu-Chek guide meter and Accu-Chek guide test strips with Accu-Chek fast clix lancets, check blood sugars 4 times daily, ICD 10 code E11.9, Disp: 1 each, Rfl: 0 .  carvedilol (COREG) 25 MG tablet, TAKE 1 TABLET BY MOUTH TWICE A DAY, Disp: 180 tablet, Rfl: 1 .  cyclobenzaprine (FLEXERIL) 10 MG tablet, Take 1 tablet (10 mg total) by mouth 3 (three) times daily as needed for muscle spasms. Must last 30 days., Disp: 20 tablet, Rfl: 0 .  desoximetasone (TOPICORT) 0.25 % cream, APPLY CREAM TO AFFECTED AREA TWO TIMES DAILY, Disp: , Rfl: 3 .  FLUoxetine (PROZAC) 40 MG capsule, TAKE ONE CAPSULE BY MOUTH TWO TIMES DAILY, Disp: 180 capsule, Rfl: 1 .  glucose blood (ACCU-CHEK GUIDE) test strip, Use as instructed, Disp: 100 each, Rfl: 5 .  hydrochlorothiazide (HYDRODIURIL) 25 MG tablet, TAKE 1 TABLET BY MOUTH EVERY DAY, Disp: 90 tablet, Rfl: 1 .  hydrOXYzine (ATARAX/VISTARIL) 50 MG tablet, TAKE 1 TABLET BY MOUTH FOUR TIMES A DAY AS NEEDED, Disp: 90 tablet, Rfl: 1 .  insulin lispro (HUMALOG KWIKPEN) 100 UNIT/ML KiwkPen, Inject 0.1 mLs (10 Units total) into the skin 3 (three) times daily with meals., Disp: 15 mL, Rfl: 3 .  JARDIANCE 10 MG TABS tablet, TAKE 1 TABLET BY MOUTH DAILY., Disp: 30 tablet, Rfl: 3 .  loratadine (CLARITIN) 10 MG tablet, Take 1 tablet (10 mg total) by mouth daily., Disp: 30 tablet, Rfl: 11 .  magnesium 30 MG tablet, Take 30 mg by mouth 2 (two) times daily., Disp: , Rfl:  .  metFORMIN  (GLUCOPHAGE) 1000 MG tablet, TAKE 1 TABLET (1,000 MG TOTAL) BY MOUTH 2 (TWO) TIMES DAILY WITH A MEAL., Disp: 180 tablet, Rfl: 2 .  mometasone (NASONEX) 50 MCG/ACT nasal spray, Place 2 sprays into the nose daily., Disp: 17 g, Rfl: 12 .  naloxone (NARCAN) 2 MG/2ML injection, Inject content of syringe into thigh muscle. Call 911., Disp: 2 Syringe, Rfl: 1 .  olmesartan (BENICAR) 40 MG tablet, TAKE 1 TABLET BY MOUTH EVERY DAY, Disp: 90 tablet, Rfl: 1 .  Oxycodone HCl 10 MG TABS, Take 1 tablet (10 mg total) by mouth every 6 (six) hours as needed., Disp: 120 tablet, Rfl: 0 .  pantoprazole (PROTONIX) 40 MG tablet, TAKE 1 TABLET BY MOUTH EVERY DAY, Disp: 30 tablet, Rfl: 3 .  pravastatin (PRAVACHOL) 40 MG tablet, TAKE 1 TABLET BY MOUTH EVERY DAY, Disp: 90 tablet, Rfl: 1 .  tamsulosin (FLOMAX)  0.4 MG CAPS capsule, TAKE 1 CAPSULE (0.4 MG TOTAL) BY MOUTH 2 (TWO) TIMES DAILY., Disp: 180 capsule, Rfl: 2 .  TRESIBA FLEXTOUCH 100 UNIT/ML SOPN FlexTouch Pen, INJECT 40 UNITS INTO THE SKIN DAILY AT 10 PM., Disp: 15 pen, Rfl: 3 .  VICTOZA 18 MG/3ML SOPN, INJECT 0.3 MLS (1.8 MG TOTAL) INTO THE SKIN DAILY., Disp: 27 pen, Rfl: 1  ROS  Constitutional: Denies any fever or chills Gastrointestinal: No reported hemesis, hematochezia, vomiting, or acute GI distress Musculoskeletal: Denies any acute onset joint swelling, redness, loss of ROM, or weakness Neurological: No reported episodes of acute onset apraxia, aphasia, dysarthria, agnosia, amnesia, paralysis, loss of coordination, or loss of consciousness  Allergies  Alan Schmidt is allergic to contrast media [iodinated diagnostic agents]; iodine; and shellfish allergy.  [place]  Drug: Alan Schmidt  reports that he does not use drugs. Alcohol:  reports that he does not drink alcohol. Tobacco:  reports that he has quit smoking. He has never used smokeless tobacco. Medical:  has a past medical history of Acute postoperative pain (11/24/2016), Anxiety, Chronic hip pain (Right)  (12/05/2014), Chronic lumbar pain, Depression, Hyperlipidemia, Hypertension, Kidney stones, and Migraines. Surgical: Alan Schmidt  has a past surgical history that includes Total hip arthroplasty; Tonsillectomy; and right hip surgery. Family: family history includes Cancer in his mother; Heart disease in his father; Stroke in his father.  Constitutional Exam  General appearance: Well nourished, well developed, and well hydrated. In no apparent acute distress There were no vitals filed for this visit. BMI Assessment: Estimated body mass index is 32.98 kg/m as calculated from the following:   Height as of 05/18/17: 6' 1" (1.854 m).   Weight as of 05/18/17: 250 lb (113.4 kg).  BMI interpretation table: BMI level Category Range association with higher incidence of chronic pain  <18 kg/m2 Underweight   18.5-24.9 kg/m2 Ideal body weight   25-29.9 kg/m2 Overweight Increased incidence by 20%  30-34.9 kg/m2 Obese (Class I) Increased incidence by 68%  35-39.9 kg/m2 Severe obesity (Class II) Increased incidence by 136%  >40 kg/m2 Extreme obesity (Class III) Increased incidence by 254%   Patient's current BMI Ideal Body weight  There is no height or weight on file to calculate BMI. Patient weight not recorded   BMI Readings from Last 4 Encounters:  05/18/17 32.98 kg/m  04/19/17 32.98 kg/m  03/16/17 35.25 kg/m  02/23/17 32.98 kg/m   Wt Readings from Last 4 Encounters:  05/18/17 250 lb (113.4 kg)  04/19/17 250 lb (113.4 kg)  03/16/17 267 lb 3.2 oz (121.2 kg)  02/23/17 250 lb (113.4 kg)  Psych/Mental status: Alert, oriented x 3 (person, place, & time)       Eyes: PERLA Respiratory: No evidence of acute respiratory distress  Cervical Spine Area Exam  Skin & Axial Inspection: No masses, redness, edema, swelling, or associated skin lesions Alignment: Symmetrical Functional ROM: Unrestricted ROM      Stability: No instability detected Muscle Tone/Strength: Functionally intact. No obvious  neuro-muscular anomalies detected. Sensory (Neurological): Unimpaired Palpation: No palpable anomalies              Upper Extremity (UE) Exam    Side: Right upper extremity  Side: Left upper extremity  Skin & Extremity Inspection: Skin color, temperature, and hair growth are WNL. No peripheral edema or cyanosis. No masses, redness, swelling, asymmetry, or associated skin lesions. No contractures.  Skin & Extremity Inspection: Skin color, temperature, and hair growth are WNL. No peripheral edema or cyanosis. No  masses, redness, swelling, asymmetry, or associated skin lesions. No contractures.  Functional ROM: Unrestricted ROM          Functional ROM: Unrestricted ROM          Muscle Tone/Strength: Functionally intact. No obvious neuro-muscular anomalies detected.  Muscle Tone/Strength: Functionally intact. No obvious neuro-muscular anomalies detected.  Sensory (Neurological): Unimpaired          Sensory (Neurological): Unimpaired          Palpation: No palpable anomalies              Palpation: No palpable anomalies              Provocative Test(s):  Phalen's test: deferred Tinel's test: deferred Apley's scratch test (touch opposite shoulder):  Action 1 (Across chest): deferred Action 2 (Overhead): deferred Action 3 (LB reach): deferred   Provocative Test(s):  Phalen's test: deferred Tinel's test: deferred Apley's scratch test (touch opposite shoulder):  Action 1 (Across chest): deferred Action 2 (Overhead): deferred Action 3 (LB reach): deferred    Thoracic Spine Area Exam  Skin & Axial Inspection: No masses, redness, or swelling Alignment: Symmetrical Functional ROM: Unrestricted ROM Stability: No instability detected Muscle Tone/Strength: Functionally intact. No obvious neuro-muscular anomalies detected. Sensory (Neurological): Unimpaired Muscle strength & Tone: No palpable anomalies  Lumbar Spine Area Exam  Skin & Axial Inspection: No masses, redness, or swelling Alignment:  Symmetrical Functional ROM: Unrestricted ROM       Stability: No instability detected Muscle Tone/Strength: Functionally intact. No obvious neuro-muscular anomalies detected. Sensory (Neurological): Unimpaired Palpation: No palpable anomalies       Provocative Tests: Lumbar Hyperextension/rotation test: deferred today       Lumbar quadrant test (Kemp's test): deferred today       Lumbar Lateral bending test: deferred today       Patrick's Maneuver: deferred today                   FABER test: deferred today                   Thigh-thrust test: deferred today       S-I compression test: deferred today       S-I distraction test: deferred today        Gait & Posture Assessment  Ambulation: Unassisted Gait: Relatively normal for age and body habitus Posture: WNL   Lower Extremity Exam    Side: Right lower extremity  Side: Left lower extremity  Stability: No instability observed          Stability: No instability observed          Skin & Extremity Inspection: Skin color, temperature, and hair growth are WNL. No peripheral edema or cyanosis. No masses, redness, swelling, asymmetry, or associated skin lesions. No contractures.  Skin & Extremity Inspection: Skin color, temperature, and hair growth are WNL. No peripheral edema or cyanosis. No masses, redness, swelling, asymmetry, or associated skin lesions. No contractures.  Functional ROM: Unrestricted ROM                  Functional ROM: Unrestricted ROM                  Muscle Tone/Strength: Functionally intact. No obvious neuro-muscular anomalies detected.  Muscle Tone/Strength: Functionally intact. No obvious neuro-muscular anomalies detected.  Sensory (Neurological): Unimpaired  Sensory (Neurological): Unimpaired  Palpation: No palpable anomalies  Palpation: No palpable anomalies   Assessment  Primary Diagnosis & Pertinent Problem List:  There were no encounter diagnoses.  Status Diagnosis  Controlled Controlled Controlled No  diagnosis found.  Problems updated and reviewed during this visit: No problems updated. Plan of Care  Pharmacotherapy (Medications Ordered): No orders of the defined types were placed in this encounter.  New Prescriptions   No medications on file   Medications administered today: Alan Schmidt had no medications administered during this visit. Lab-work, procedure(s), and/or referral(s): No orders of the defined types were placed in this encounter.  Imaging and/or referral(s): None  Interventional therapies: Planned, scheduled, and/or pending:  Not at this time.   Considering:  Palliative bilateral lumbar facet RFA (Right done on 02/17/16 & 04/30/15) Diagnostic Left GONB Possible Left GON RFA Palliative Left CESI   Palliative PRN treatment(s):  Diagnostic Left GONB      Provider-requested follow-up: No follow-ups on file.  Future Appointments  Date Time Provider [place]  08/17/2017  1:30 PM Vevelyn Francois, NP [facility] None  09/27/2017  8:30 AM [name] [name], MD [facility]   Primary Care Physician: [name], MD Location: [facility] Note by: Vevelyn Francois NP Date: 08/17/2017; Time: 1:19 PM  Pain Score Disclaimer: We use the NRS-11 scale. This is a self-reported, subjective measurement of pain severity with only modest accuracy. It is used primarily to identify changes within a particular patient. It must be understood that outpatient pain scales are significantly less accurate that those used for research, where they can be applied under ideal controlled circumstances with minimal exposure to variables. In reality, the score is likely to be a combination of pain intensity and pain affect, where pain affect describes the degree of emotional arousal or changes in action readiness caused by the sensory experience of pain. Factors such as social and work situation, setting, emotional state, anxiety levels,  expectation, and prior pain experience may influence pain perception and show large inter-individual differences that may also be affected by time variables.  Patient instructions provided during this appointment: There are no Patient Instructions on file for this visit.

## 2017-08-21 ENCOUNTER — Other Ambulatory Visit: Payer: Self-pay | Admitting: Family Medicine

## 2017-09-09 ENCOUNTER — Other Ambulatory Visit: Payer: Self-pay | Admitting: Family Medicine

## 2017-09-10 ENCOUNTER — Other Ambulatory Visit: Payer: Self-pay | Admitting: Family Medicine

## 2017-09-15 ENCOUNTER — Ambulatory Visit: Payer: Medicare Other | Attending: Nurse Practitioner | Admitting: Nurse Practitioner

## 2017-09-15 ENCOUNTER — Other Ambulatory Visit: Payer: Self-pay

## 2017-09-15 ENCOUNTER — Encounter: Payer: Self-pay | Admitting: Nurse Practitioner

## 2017-09-15 VITALS — BP 137/86 | HR 87 | Temp 97.8°F | Resp 16 | Ht 73.0 in | Wt 250.0 lb

## 2017-09-15 DIAGNOSIS — M545 Low back pain: Secondary | ICD-10-CM | POA: Diagnosis not present

## 2017-09-15 DIAGNOSIS — E785 Hyperlipidemia, unspecified: Secondary | ICD-10-CM | POA: Insufficient documentation

## 2017-09-15 DIAGNOSIS — F419 Anxiety disorder, unspecified: Secondary | ICD-10-CM | POA: Diagnosis not present

## 2017-09-15 DIAGNOSIS — M25511 Pain in right shoulder: Secondary | ICD-10-CM | POA: Diagnosis not present

## 2017-09-15 DIAGNOSIS — M5412 Radiculopathy, cervical region: Secondary | ICD-10-CM | POA: Insufficient documentation

## 2017-09-15 DIAGNOSIS — I9589 Other hypotension: Secondary | ICD-10-CM | POA: Insufficient documentation

## 2017-09-15 DIAGNOSIS — G8220 Paraplegia, unspecified: Secondary | ICD-10-CM | POA: Insufficient documentation

## 2017-09-15 DIAGNOSIS — M25512 Pain in left shoulder: Secondary | ICD-10-CM | POA: Insufficient documentation

## 2017-09-15 DIAGNOSIS — R42 Dizziness and giddiness: Secondary | ICD-10-CM | POA: Insufficient documentation

## 2017-09-15 DIAGNOSIS — I129 Hypertensive chronic kidney disease with stage 1 through stage 4 chronic kidney disease, or unspecified chronic kidney disease: Secondary | ICD-10-CM | POA: Insufficient documentation

## 2017-09-15 DIAGNOSIS — M25551 Pain in right hip: Secondary | ICD-10-CM | POA: Diagnosis not present

## 2017-09-15 DIAGNOSIS — K219 Gastro-esophageal reflux disease without esophagitis: Secondary | ICD-10-CM | POA: Insufficient documentation

## 2017-09-15 DIAGNOSIS — R51 Headache: Secondary | ICD-10-CM | POA: Diagnosis not present

## 2017-09-15 DIAGNOSIS — Z760 Encounter for issue of repeat prescription: Secondary | ICD-10-CM | POA: Insufficient documentation

## 2017-09-15 DIAGNOSIS — N189 Chronic kidney disease, unspecified: Secondary | ICD-10-CM | POA: Insufficient documentation

## 2017-09-15 DIAGNOSIS — M533 Sacrococcygeal disorders, not elsewhere classified: Secondary | ICD-10-CM | POA: Insufficient documentation

## 2017-09-15 DIAGNOSIS — M25562 Pain in left knee: Secondary | ICD-10-CM | POA: Insufficient documentation

## 2017-09-15 DIAGNOSIS — M25561 Pain in right knee: Secondary | ICD-10-CM | POA: Insufficient documentation

## 2017-09-15 DIAGNOSIS — E1142 Type 2 diabetes mellitus with diabetic polyneuropathy: Secondary | ICD-10-CM | POA: Diagnosis not present

## 2017-09-15 DIAGNOSIS — J309 Allergic rhinitis, unspecified: Secondary | ICD-10-CM | POA: Diagnosis not present

## 2017-09-15 DIAGNOSIS — R209 Unspecified disturbances of skin sensation: Secondary | ICD-10-CM | POA: Insufficient documentation

## 2017-09-15 DIAGNOSIS — G894 Chronic pain syndrome: Secondary | ICD-10-CM | POA: Insufficient documentation

## 2017-09-15 DIAGNOSIS — M7918 Myalgia, other site: Secondary | ICD-10-CM

## 2017-09-15 DIAGNOSIS — M47816 Spondylosis without myelopathy or radiculopathy, lumbar region: Secondary | ICD-10-CM | POA: Diagnosis not present

## 2017-09-15 DIAGNOSIS — F329 Major depressive disorder, single episode, unspecified: Secondary | ICD-10-CM | POA: Insufficient documentation

## 2017-09-15 DIAGNOSIS — M5116 Intervertebral disc disorders with radiculopathy, lumbar region: Secondary | ICD-10-CM | POA: Insufficient documentation

## 2017-09-15 DIAGNOSIS — Z794 Long term (current) use of insulin: Secondary | ICD-10-CM | POA: Insufficient documentation

## 2017-09-15 DIAGNOSIS — N400 Enlarged prostate without lower urinary tract symptoms: Secondary | ICD-10-CM | POA: Diagnosis not present

## 2017-09-15 DIAGNOSIS — Z7982 Long term (current) use of aspirin: Secondary | ICD-10-CM | POA: Insufficient documentation

## 2017-09-15 DIAGNOSIS — M5481 Occipital neuralgia: Secondary | ICD-10-CM | POA: Insufficient documentation

## 2017-09-15 DIAGNOSIS — Z79899 Other long term (current) drug therapy: Secondary | ICD-10-CM | POA: Insufficient documentation

## 2017-09-15 DIAGNOSIS — Z79891 Long term (current) use of opiate analgesic: Secondary | ICD-10-CM | POA: Insufficient documentation

## 2017-09-15 DIAGNOSIS — M79605 Pain in left leg: Secondary | ICD-10-CM | POA: Insufficient documentation

## 2017-09-15 DIAGNOSIS — G8929 Other chronic pain: Secondary | ICD-10-CM | POA: Diagnosis not present

## 2017-09-15 DIAGNOSIS — M79604 Pain in right leg: Secondary | ICD-10-CM | POA: Insufficient documentation

## 2017-09-15 MED ORDER — CYCLOBENZAPRINE HCL 10 MG PO TABS: 10.0000 mg | ORAL_TABLET | Freq: Three times a day (TID) | ORAL | 2 refills | Status: DC | PRN

## 2017-09-15 MED ORDER — OXYCODONE HCL 10 MG PO TABS: 10.0000 mg | ORAL_TABLET | Freq: Four times a day (QID) | ORAL | 0 refills | Status: DC | PRN

## 2017-09-15 NOTE — Progress Notes (Signed)
Patient's Name: Alan Schmidt  MRN: [id number]  Referring Provider: [name], MD  DOB: 1945/06/11  PCP: [name], MD  DOS: 09/15/2017  Note by: Vevelyn Francois NP  Service setting: Ambulatory outpatient  Specialty: Interventional Pain Management  Location: [facility] (AMB) Pain Management Facility    Patient type: Established    Primary Reason(s) for Visit: Encounter for prescription drug management. (Level of risk: moderate)  CC: Medication Refill and Back Pain  HPI  Alan Schmidt is a 72 y.o. year old, male patient, who comes today for a medication management evaluation. He has Paraparesis ([place]); Chronic low back pain (Primary Source of Pain) (Bilateral) (R>L); Lumbar facet syndrome (Bilateral) (R>L); Lumbar spondylosis; Diabetic peripheral neuropathy ([place]); Long term current use of opiate analgesic; Long term prescription opiate use; Opiate use (60 MME/Day); Opiate dependence ([place]); Encounter for therapeutic drug level monitoring; Chronic neck pain (midline over the C7 spinous processes) (L>R); Neurogenic pain; Neuropathic pain; Contrast dye allergy; Chronic lower extremity pain (Secondary source of pain) (Bilateral) (R>L); 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) (Right L5 dermatome); History of total hip replacement (Right); Chronic hip pain (Right); Class I Morbid obesity (HCC) (68% higher incidence of chronic low back pain); Essential hypertension; GERD (gastroesophageal reflux disease); Obstructive sleep apnea; Hyperlipidemia; Chronic kidney disease (CKD); Abnormal nerve conduction studies (severe bilateral lower extremity polyneuropathy); Chronic shoulder pain (Bilateral) (R>L); Occipital neuralgia (Left); Cervicogenic headache (Left); Diabetes ([place]); BPH (benign prostatic hyperplasia); History of Vasovagal response to spinal injections; Allergic rhinitis; Anxiety and depression; Disturbance of skin sensation; Chronic shoulder radicular pain (Left); Chronic cervical radicular  pain (Left); Chronic pain syndrome; Opioid-induced constipation (OIC); History of postoperative nausea and vomiting; Nevus; Lumbar facet joint osteoarthritis (Bilateral); History of allergy to radiographic contrast media; Hypotension during surgery; Chronic headache; Chronic tension-type headache, intractable; Vertigo; Coccygodynia; DDD (degenerative disc disease), lumbar; Chronic musculoskeletal pain; and Chronic pain of both knees on their problem list. His primarily concern today is the Medication Refill and Back Pain  Pain Assessment: Location: Lower Back Radiating: "At times can radiate down to hamstrings" Onset: Other (comment) Duration: Chronic pain Quality: Constant, Aching, Discomfort Severity: 3 /10 (subjective, self-reported pain score)  Note: Reported level is compatible with observation.                          Effect on ADL: Difficult with activities  Timing: Constant Modifying factors: Medications  BP: 137/86  HR: 87  Alan Schmidt was last scheduled for an appointment on 05/18/2017 for medication management. During today's appointment we reviewed Alan Schmidt's chronic pain status, as well as his outpatient medication regimen. He admits that he has increased back spasms and pain. He admits that he has some days that are worse than other.  He states that the the flexeril is effective for his muscle relaxer. He denies any over sedation.  The patient  reports that he does not use drugs. His body mass index is 32.98 kg/m.  Further details on both, my assessment(s), as well as the proposed treatment plan, please see below.  Controlled Substance Pharmacotherapy Assessment REMS (Risk Evaluation and Mitigation Strategy)  Analgesic:Oxycodone IR 10 mg every 6 hours (40 mg/day) MME/day:60 mg/day.  Janne Napoleon, RN  09/15/2017  8:38 AM  Sign at close encounter  Safety precautions to be maintained throughout the outpatient stay will include: orient to surroundings, keep bed in low  position, maintain call bell within reach at all times, provide assistance with  transfer out of bed and ambulation.   Nursing Pain Medication Assessment:  Safety precautions to be maintained throughout the outpatient stay will include: orient to surroundings, keep bed in low position, maintain call bell within reach at all times, provide assistance with transfer out of bed and ambulation.  Medication Inspection Compliance: Pill count conducted under aseptic conditions, in front of the patient. Neither the pills nor the bottle was removed from the patient's sight at any time. Once count was completed pills were immediately returned to the patient in their original bottle.  Medication: Oxycodone 14m Pill/Patch Count: 4 of 120 pills remain Pill/Patch Appearance: Markings consistent with prescribed medication Bottle Appearance: Standard pharmacy container. Clearly labeled. Filled Date: 05 / 21 / 2019 Last Medication intake:  Today   Pharmacokinetics: Liberation and absorption (onset of action): WNL Distribution (time to peak effect): WNL Metabolism and excretion (duration of action): WNL         Pharmacodynamics: Desired effects: Analgesia: Alan Schmidt >50% benefit. Functional ability: Patient reports that medication allows him to accomplish basic ADLs Clinically meaningful improvement in function (CMIF): Sustained CMIF goals met Perceived effectiveness: Described as relatively effective, allowing for increase in activities of daily living (ADL) Undesirable effects: Side-effects or Adverse reactions: None reported Monitoring: [place] PMP: Online review of the past 143-montheriod conducted. Compliant with practice rules and regulations Last UDS on record: Summary  Date Value Ref Range Status  05/18/2017 FINAL  Final    Comment:    ==================================================================== TOXASSURE SELECT 13  (MW) ==================================================================== Test                             Result       Flag       Units Drug Present and Declared for Prescription Verification   Alprazolam                     39           EXPECTED   ng/mg creat   Alpha-hydroxyalprazolam        54           EXPECTED   ng/mg creat    Source of alprazolam is a scheduled prescription medication.    Alpha-hydroxyalprazolam is an expected metabolite of alprazolam.   Oxycodone                      3568         EXPECTED   ng/mg creat   Oxymorphone                    63           EXPECTED   ng/mg creat   Noroxycodone                   2463         EXPECTED   ng/mg creat    Sources of oxycodone include scheduled prescription medications.    Oxymorphone and noroxycodone are expected metabolites of    oxycodone. Oxymorphone is also available as a scheduled    prescription medication. ==================================================================== Test                      Result    Flag   Units      Ref Range   Creatinine  98               mg/dL      >=20 ==================================================================== Declared Medications:  The flagging and interpretation on this report are based on the  following declared medications.  Unexpected results may arise from  inaccuracies in the declared medications.  **Note: The testing scope of this panel includes these medications:  Alprazolam  Oxycodone  **Note: The testing scope of this panel does not include following  reported medications:  Acetaminophen  Amlodipine  Aspirin  Carvedilol  Cyclobenzaprine  Desoximetasone  Empagliflozin  Fluoxetine  Hydrochlorothiazide (HCTZ)  Hydroxyzine  Insulin  Liraglutide (Victoza)  Loratadine  Magnesium  Metformin  Mometasone  Naloxone  Olmesartan  Pantoprazole  Pravastatin  Tamsulosin ==================================================================== For clinical  consultation, please call [phone]. ====================================================================    UDS interpretation: Compliant          Medication Assessment Form: Reviewed. Patient indicates being compliant with therapy Treatment compliance: Compliant Risk Assessment Profile: Aberrant behavior: See prior evaluations. None observed or detected today Comorbid factors increasing risk of overdose: See prior notes. No additional risks detected today Opioid risk tool (ORT) (Total Score): 0 Personal History of Substance Abuse (SUD-Substance use disorder):  Alcohol: Negative  Illegal Drugs: Negative  Rx Drugs: Negative  ORT Risk Level calculation: Low Risk Risk of substance use disorder (SUD): Low Opioid Risk Tool - 09/15/17 0844      Family History of Substance Abuse   Alcohol  Negative    Illegal Drugs  Negative    Rx Drugs  Negative      Personal History of Substance Abuse   Alcohol  Negative    Illegal Drugs  Negative    Rx Drugs  Negative      Age   Age between 19-45 years   No      History of Preadolescent Sexual Abuse   History of Preadolescent Sexual Abuse  Negative or Male      Psychological Disease   Psychological Disease  Negative    Depression  Negative      Total Score   Opioid Risk Tool Scoring  0    Opioid Risk Interpretation  Low Risk      ORT Scoring interpretation table:  Score <3 = Low Risk for SUD  Score between 4-7 = Moderate Risk for SUD  Score >8 = High Risk for Opioid Abuse   Risk Mitigation Strategies:  Patient Counseling: Covered Patient-Prescriber Agreement (PPA): Present and active  Notification to other healthcare providers: Done  Pharmacologic Plan: No change in therapy, at this time.             Laboratory Chemistry  Inflammation Markers (CRP: Acute Phase) (ESR: Chronic Phase) Lab Results  Component Value Date   CRP 0.5 03/06/2015   ESRSEDRATE 45 (H) 12/14/2016                         Rheumatology Markers No  results found for: RF, ANA, LABURIC, URICUR, LYMEIGGIGMAB, LYMEABIGMQN, HLAB27                      Renal Function Markers Lab Results  Component Value Date   BUN 13 12/14/2016   CREATININE 0.89 12/14/2016   GFRAA >60 10/25/2016   GFRNONAA >60 10/25/2016                             Hepatic Function  Markers Lab Results  Component Value Date   AST 25 09/25/2016   ALT 27 09/25/2016   ALBUMIN 3.9 09/25/2016   ALKPHOS 67 09/25/2016                        Electrolytes Lab Results  Component Value Date   NA 136 12/14/2016   K 4.3 12/14/2016   CL 100 12/14/2016   CALCIUM 9.1 12/14/2016   MG 1.8 03/06/2015                        Neuropathy Markers Lab Results  Component Value Date   HGBA1C 7.9 03/16/2017                        Bone Pathology Markers No results found for: [place], VO350KX3GHW, EX9371IR6, VE9381OF7, 25OHVITD1, 25OHVITD2, 25OHVITD3, TESTOFREE, TESTOSTERONE                       Coagulation Parameters Lab Results  Component Value Date   PLT 278 10/25/2016                        Cardiovascular Markers Lab Results  Component Value Date   HGB 13.8 10/25/2016   HCT 41.5 10/25/2016                         CA Markers No results found for: CEA, CA125, LABCA2                      Note: Lab results reviewed.  Recent Diagnostic Imaging Results  DG C-Arm 1-60 Min-No Report Fluoroscopy was utilized by the requesting physician.  No radiographic  interpretation.   Complexity Note: Imaging results reviewed. Results shared with Alan Schmidt, using Layman's terms.                         Meds   Current Outpatient Medications:  .  ACCU-CHEK FASTCLIX LANCETS MISC, CHECK 4 TIMES A DAY, Disp: 102 each, Rfl: 0 .  acetaminophen (TYLENOL) 500 MG tablet, Take 500 mg by mouth every 6 (six) hours as needed., Disp: , Rfl:  .  ALPRAZolam (XANAX) 0.5 MG tablet, TAKE 1 TABLET BY MOUTH TWICE A DAY AS NEEDED, Disp: 60 tablet, Rfl: 1 .  amLODipine (NORVASC) 5 MG tablet, Take 1  tablet (5 mg total) by mouth daily., Disp: 90 tablet, Rfl: 3 .  aspirin EC 325 MG tablet, Take 325 mg by mouth daily. , Disp: , Rfl:  .  BD PEN NEEDLE NANO U/F 32G X 4 MM MISC, USE EVERY DAY, Disp: 100 each, Rfl: 4 .  blood glucose meter kit and supplies KIT, Accu-Chek guide meter and Accu-Chek guide test strips with Accu-Chek fast clix lancets, check blood sugars 4 times daily, ICD 10 code E11.9, Disp: 1 each, Rfl: 0 .  carvedilol (COREG) 25 MG tablet, TAKE 1 TABLET BY MOUTH TWICE A DAY, Disp: 180 tablet, Rfl: 1 .  desoximetasone (TOPICORT) 0.25 % cream, APPLY CREAM TO AFFECTED AREA TWO TIMES DAILY, Disp: , Rfl: 3 .  FLUoxetine (PROZAC) 40 MG capsule, TAKE ONE CAPSULE BY MOUTH TWO TIMES DAILY, Disp: 180 capsule, Rfl: 1 .  glucose blood (ACCU-CHEK GUIDE) test strip, Use as instructed, Disp: 100 each, Rfl: 5 .  hydrochlorothiazide (HYDRODIURIL) 25 MG tablet, TAKE  1 TABLET BY MOUTH EVERY DAY, Disp: 90 tablet, Rfl: 1 .  hydrOXYzine (ATARAX/VISTARIL) 50 MG tablet, TAKE 1 TABLET BY MOUTH FOUR TIMES A DAY AS NEEDED, Disp: 90 tablet, Rfl: 1 .  insulin lispro (HUMALOG KWIKPEN) 100 UNIT/ML KiwkPen, Inject 0.1 mLs (10 Units total) into the skin 3 (three) times daily with meals., Disp: 15 mL, Rfl: 3 .  JARDIANCE 10 MG TABS tablet, TAKE 1 TABLET BY MOUTH DAILY., Disp: 30 tablet, Rfl: 3 .  loratadine (CLARITIN) 10 MG tablet, Take 1 tablet (10 mg total) by mouth daily., Disp: 30 tablet, Rfl: 11 .  magnesium 30 MG tablet, Take 30 mg by mouth 2 (two) times daily., Disp: , Rfl:  .  metFORMIN (GLUCOPHAGE) 1000 MG tablet, TAKE 1 TABLET (1,000 MG TOTAL) BY MOUTH 2 (TWO) TIMES DAILY WITH A MEAL., Disp: 180 tablet, Rfl: 2 .  mometasone (NASONEX) 50 MCG/ACT nasal spray, Place 2 sprays into the nose daily., Disp: 17 g, Rfl: 12 .  naloxone (NARCAN) 2 MG/2ML injection, Inject content of syringe into thigh muscle. Call 911., Disp: 2 Syringe, Rfl: 1 .  olmesartan (BENICAR) 40 MG tablet, TAKE 1 TABLET BY MOUTH EVERY DAY, Disp:  90 tablet, Rfl: 1 .  pantoprazole (PROTONIX) 40 MG tablet, TAKE 1 TABLET BY MOUTH EVERY DAY, Disp: 90 tablet, Rfl: 1 .  pravastatin (PRAVACHOL) 40 MG tablet, TAKE 1 TABLET BY MOUTH EVERY DAY, Disp: 90 tablet, Rfl: 1 .  tamsulosin (FLOMAX) 0.4 MG CAPS capsule, TAKE 1 CAPSULE (0.4 MG TOTAL) BY MOUTH 2 (TWO) TIMES DAILY., Disp: 180 capsule, Rfl: 2 .  TRESIBA FLEXTOUCH 100 UNIT/ML SOPN FlexTouch Pen, INJECT 40 UNITS INTO THE SKIN DAILY AT 10 PM., Disp: 15 pen, Rfl: 3 .  VICTOZA 18 MG/3ML SOPN, INJECT 0.3 MLS (1.8 MG TOTAL) INTO THE SKIN DAILY., Disp: 27 pen, Rfl: 1 .  cyclobenzaprine (FLEXERIL) 10 MG tablet, Take 1 tablet (10 mg total) by mouth 3 (three) times daily as needed for muscle spasms. Must last 30 days., Disp: 90 tablet, Rfl: 2 .  [START ON 11/14/2017] Oxycodone HCl 10 MG TABS, Take 1 tablet (10 mg total) by mouth every 6 (six) hours as needed., Disp: 120 tablet, Rfl: 0 .  [START ON 10/15/2017] Oxycodone HCl 10 MG TABS, Take 1 tablet (10 mg total) by mouth every 6 (six) hours as needed., Disp: 120 tablet, Rfl: 0 .  Oxycodone HCl 10 MG TABS, Take 1 tablet (10 mg total) by mouth every 6 (six) hours as needed., Disp: 120 tablet, Rfl: 0  ROS  Constitutional: Denies any fever or chills Gastrointestinal: No reported hemesis, hematochezia, vomiting, or acute GI distress Musculoskeletal: Denies any acute onset joint swelling, redness, loss of ROM, or weakness Neurological: No reported episodes of acute onset apraxia, aphasia, dysarthria, agnosia, amnesia, paralysis, loss of coordination, or loss of consciousness  Allergies  Alan Schmidt is allergic to contrast media [iodinated diagnostic agents]; iodine; and shellfish allergy.  [place]  Drug: Alan Schmidt  reports that he does not use drugs. Alcohol:  reports that he does not drink alcohol. Tobacco:  reports that he has quit smoking. He has never used smokeless tobacco. Medical:  has a past medical history of Acute postoperative pain (11/24/2016),  Anxiety, Chronic hip pain (Right) (12/05/2014), Chronic lumbar pain, Depression, Hyperlipidemia, Hypertension, Kidney stones, and Migraines. Surgical: Alan Schmidt  has a past surgical history that includes Total hip arthroplasty; Tonsillectomy; and right hip surgery. Family: family history includes Cancer in his mother; Heart disease in his  father; Stroke in his father.  Constitutional Exam  General appearance: Well nourished, well developed, and well hydrated. In no apparent acute distress Vitals:   09/15/17 0826  BP: 137/86  Pulse: 87  Resp: 16  Temp: 97.8 F (36.6 C)  TempSrc: Oral  SpO2: 100%  Weight: 250 lb (113.4 kg)  Height: 6' 1" (1.854 m)   BMI Assessment: Estimated body mass index is 32.98 kg/m as calculated from the following:   Height as of this encounter: 6' 1" (1.854 m).   Weight as of this encounter: 250 lb (113.4 kg). Psych/Mental status: Alert, oriented x 3 (person, place, & time)       Eyes: PERLA Respiratory: No evidence of acute respiratory distress  Lumbar Spine Area Exam  Skin & Axial Inspection: No masses, redness, or swelling Alignment: Symmetrical Functional ROM: Unrestricted ROM       Stability: No instability detected Muscle Tone/Strength: Increased muscle tone over affected area Sensory (Neurological): Unimpaired Palpation: Tender        Gait & Posture Assessment  Ambulation: Unassisted Gait: Relatively normal for age and body habitus Posture: WNL   Lower Extremity Exam    Side: Right lower extremity  Side: Left lower extremity  Stability: No instability observed          Stability: No instability observed          Skin & Extremity Inspection: Skin color, temperature, and hair growth are WNL. No peripheral edema or cyanosis. No masses, redness, swelling, asymmetry, or associated skin lesions. No contractures.  Skin & Extremity Inspection: Skin color, temperature, and hair growth are WNL. No peripheral edema or cyanosis. No masses, redness,  swelling, asymmetry, or associated skin lesions. No contractures.  Functional ROM: Unrestricted ROM                  Functional ROM: Unrestricted ROM                  Muscle Tone/Strength: Functionally intact. No obvious neuro-muscular anomalies detected.  Muscle Tone/Strength: Functionally intact. No obvious neuro-muscular anomalies detected.  Sensory (Neurological): Unimpaired  Sensory (Neurological): Unimpaired  Palpation: No palpable anomalies  Palpation: No palpable anomalies   Assessment  Primary Diagnosis & Pertinent Problem List: The primary encounter diagnosis was Lumbar spondylosis. Diagnoses of Chronic hip pain (Right), Chronic musculoskeletal pain, and Chronic pain syndrome were also pertinent to this visit.  Status Diagnosis  Persistent Controlled Persistent 1. Lumbar spondylosis   2. Chronic hip pain (Right)   3. Chronic musculoskeletal pain   4. Chronic pain syndrome     Problems updated and reviewed during this visit: No problems updated. Plan of Care  Pharmacotherapy (Medications Ordered): Meds ordered this encounter  Medications  . cyclobenzaprine (FLEXERIL) 10 MG tablet    Sig: Take 1 tablet (10 mg total) by mouth 3 (three) times daily as needed for muscle spasms. Must last 30 days.    Dispense:  90 tablet    Refill:  2    Do not place this medication, or any other prescription from our practice, on "Automatic Refill". Patient may have prescription filled one day early if pharmacy is closed on scheduled refill date. No refills.    Order Specific Question:   Supervising Provider    Answer:   Milinda Pointer [phone]  . Oxycodone HCl 10 MG TABS    Sig: Take 1 tablet (10 mg total) by mouth every 6 (six) hours as needed.    Dispense:  120 tablet  Refill:  0    Do not place this medication on "Automatic Refill". Patient may have prescription filled one day early if pharmacy is closed on scheduled refill date. Do not fill until: 11/14/2017 To last  until:12/14/2017    Order Specific Question:   Supervising Provider    Answer:   Milinda Pointer [phone]  . Oxycodone HCl 10 MG TABS    Sig: Take 1 tablet (10 mg total) by mouth every 6 (six) hours as needed.    Dispense:  120 tablet    Refill:  0    Do not place this medication on "Automatic Refill". Patient may have prescription filled one day early if pharmacy is closed on scheduled refill date. Do not fill until: 10/15/2017 To last until: 11/14/2017    Order Specific Question:   Supervising Provider    Answer:   Milinda Pointer [phone]  . Oxycodone HCl 10 MG TABS    Sig: Take 1 tablet (10 mg total) by mouth every 6 (six) hours as needed.    Dispense:  120 tablet    Refill:  0    Do not place this medication on "Automatic Refill". Patient may have prescription filled one day early if pharmacy is closed on scheduled refill date. Do not fill until: 09/15/2017 To last until: 10/15/2017    Order Specific Question:   Supervising Provider    Answer:   Milinda Pointer [phone]   New Prescriptions   No medications on file   Medications administered today: Alan Schmidt had no medications administered during this visit. Lab-work, procedure(s), and/or referral(s): No orders of the defined types were placed in this encounter.  Imaging and/or referral(s): None  Interventional therapies: Planned, scheduled, and/or pending:  Not at this time.   Considering:  Palliative bilateral lumbar facet RFA (Right done on 02/17/16 & 04/30/15) Diagnostic Left GONB Possible Left GON RFA Palliative Left CESI   Palliative PRN treatment(s): Diagnostic Left GONB    Provider-requested follow-up: Return in about 3 months (around 12/16/2017) for MedMgmt with Me Donella Stade [facility] Pace).  Future Appointments  Date Time Provider [place]  09/27/2017  8:30 AM [name], MD [id number]  12/07/2017  1:30 PM Vevelyn Francois, NP [facility] None   Primary Care Physician: [name], MD Location: [facility] Note by: Vevelyn Francois NP Date: 09/15/2017; Time: 4:08 PM  Pain Score Disclaimer: We use the NRS-11 scale. This is a self-reported, subjective measurement of pain severity with only modest accuracy. It is used primarily to identify changes within a particular patient. It must be understood that outpatient pain scales are significantly less accurate that those used for research, where they can be applied under ideal controlled circumstances with minimal exposure to variables. In reality, the score is likely to be a combination of pain intensity and pain affect, where pain affect describes the degree of emotional arousal or changes in action readiness caused by the sensory experience of pain. Factors such as social and work situation, setting, emotional state, anxiety levels, expectation, and prior pain experience may influence pain perception and show large inter-individual differences that may also be affected by time variables.  Patient instructions provided during this appointment: Patient Instructions  Flexeril and 3 Rx for Oxycodone have been escribed to your pharmacy. ____________________________________________________________________________________________  Medication Rules  Applies to: All patients receiving prescriptions (written or electronic).  Pharmacy of record: Pharmacy where electronic prescriptions will be sent. If written prescriptions are taken to a different  pharmacy, please inform the nursing staff. The pharmacy listed in the electronic medical record should be the one where you would like electronic prescriptions to be sent.  Prescription refills: Only during scheduled appointments. Applies to both, written and electronic prescriptions.  NOTE: The following applies primarily to controlled substances (Opioid* Pain Medications).   Patient's responsibilities: 1. Pain Pills: Bring all pain pills to every  appointment (except for procedure appointments). 2. Pill Bottles: Bring pills in original pharmacy bottle. Always bring newest bottle. Bring bottle, even if empty. 3. Medication refills: You are responsible for knowing and keeping track of what medications you need refilled. The day before your appointment, write a list of all prescriptions that need to be refilled. Bring that list to your appointment and give it to the admitting nurse. Prescriptions will be written only during appointments. If you forget a medication, it will not be "Called in", "Faxed", or "electronically sent". You will need to get another appointment to get these prescribed. 4. Prescription Accuracy: You are responsible for carefully inspecting your prescriptions before leaving our office. Have the discharge nurse carefully go over each prescription with you, before taking them home. Make sure that your name is accurately spelled, that your address is correct. Check the name and dose of your medication to make sure it is accurate. Check the number of pills, and the written instructions to make sure they are clear and accurate. Make sure that you are given enough medication to last until your next medication refill appointment. 5. Taking Medication: Take medication as prescribed. Never take more pills than instructed. Never take medication more frequently than prescribed. Taking less pills or less frequently is permitted and encouraged, when it comes to controlled substances (written prescriptions).  6. Inform other Doctors: Always inform, all of your healthcare providers, of all the medications you take. 7. Pain Medication from other Providers: You are not allowed to accept any additional pain medication from any other Doctor or Healthcare provider. There are two exceptions to this rule. (see below) In the event that you require additional pain medication, you are responsible for notifying us, as stated below. 8. Medication Agreement: You  are responsible for carefully reading and following our Medication Agreement. This must be signed before receiving any prescriptions from our practice. Safely store a copy of your signed Agreement. Violations to the Agreement will result in no further prescriptions. (Additional copies of our Medication Agreement are available upon request.) 9. Laws, Rules, & Regulations: All patients are expected to follow all Federal and [employer], [employer], Rules, [employer]. Ignorance of the Laws does not constitute a valid excuse. The use of any illegal substances is prohibited. 10. Adopted CDC guidelines & recommendations: Target dosing levels will be at or below 60 MME/day. Use of benzodiazepines** is not recommended.  Exceptions: There are only two exceptions to the rule of not receiving pain medications from other Healthcare Providers. 1. Exception #1 (Emergencies): In the event of an emergency (i.e.: accident requiring emergency care), you are allowed to receive additional pain medication. However, you are responsible for: As soon as you are able, call our office [phone], at any time of the day or night, and leave a message stating your name, the date and nature of the emergency, and the name and dose of the medication prescribed. In the event that your call is answered by a member of our staff, make sure to document and save the date, time, and the name of the person that took your  information.  2. Exception #2 (Planned Surgery): In the event that you are scheduled by another doctor or dentist to have any type of surgery or procedure, you are allowed (for a period no longer than 30 days), to receive additional pain medication, for the acute post-op pain. However, in this case, you are responsible for picking up a copy of our "Post-op Pain Management for Surgeons" handout, and giving it to your surgeon or dentist. This document is available at our office, and does not require an appointment to obtain it.  Simply go to our office during business hours (Monday-Thursday from 8:00 AM to 4:00 PM) (Friday 8:00 AM to 12:00 Noon) or if you have a scheduled appointment with [place], prior to your surgery, and ask for it by name. In addition, you will need to provide [place] with your name, name of your surgeon, type of surgery, and date of procedure or surgery.  *Opioid medications include: morphine, codeine, oxycodone, oxymorphone, hydrocodone, hydromorphone, meperidine, tramadol, tapentadol, buprenorphine, fentanyl, methadone. **Benzodiazepine medications include: diazepam (Valium), alprazolam (Xanax), clonazepam (Klonopine), lorazepam (Ativan), clorazepate (Tranxene), chlordiazepoxide (Librium), estazolam (Prosom), oxazepam (Serax), temazepam (Restoril), triazolam (Halcion) (Last updated: 04/01/2017) ____________________________________________________________________________________________

## 2017-09-15 NOTE — Patient Instructions (Addendum)
Flexeril and 3 Rx for Oxycodone have been escribed to your pharmacy. ____________________________________________________________________________________________  Medication Rules  Applies to: All patients receiving prescriptions (written or electronic).  Pharmacy of record: Pharmacy where electronic prescriptions will be sent. If written prescriptions are taken to a different pharmacy, please inform the nursing staff. The pharmacy listed in the electronic medical record should be the one where you would like electronic prescriptions to be sent.  Prescription refills: Only during scheduled appointments. Applies to both, written and electronic prescriptions.  NOTE: The following applies primarily to controlled substances (Opioid* Pain Medications).   Patient's responsibilities: 1. Pain Pills: Bring all pain pills to every appointment (except for procedure appointments). 2. Pill Bottles: Bring pills in original pharmacy bottle. Always bring newest bottle. Bring bottle, even if empty. 3. Medication refills: You are responsible for knowing and keeping track of what medications you need refilled. The day before your appointment, write a list of all prescriptions that need to be refilled. Bring that list to your appointment and give it to the admitting nurse. Prescriptions will be written only during appointments. If you forget a medication, it will not be "Called in", "Faxed", or "electronically sent". You will need to get another appointment to get these prescribed. 4. Prescription Accuracy: You are responsible for carefully inspecting your prescriptions before leaving our office. Have the discharge nurse carefully go over each prescription with you, before taking them home. Make sure that your name is accurately spelled, that your address is correct. Check the name and dose of your medication to make sure it is accurate. Check the number of pills, and the written instructions to make sure they are clear  and accurate. Make sure that you are given enough medication to last until your next medication refill appointment. 5. Taking Medication: Take medication as prescribed. Never take more pills than instructed. Never take medication more frequently than prescribed. Taking less pills or less frequently is permitted and encouraged, when it comes to controlled substances (written prescriptions).  6. Inform other Doctors: Always inform, all of your healthcare providers, of all the medications you take. 7. Pain Medication from other Providers: You are not allowed to accept any additional pain medication from any other Doctor or Healthcare provider. There are two exceptions to this rule. (see below) In the event that you require additional pain medication, you are responsible for notifying us, as stated below. 8. Medication Agreement: You are responsible for carefully reading and following our Medication Agreement. This must be signed before receiving any prescriptions from our practice. Safely store a copy of your signed Agreement. Violations to the Agreement will result in no further prescriptions. (Additional copies of our Medication Agreement are available upon request.) 9. Laws, Rules, & Regulations: All patients are expected to follow all Federal and [employer], [employer], Rules, [employer]. Ignorance of the Laws does not constitute a valid excuse. The use of any illegal substances is prohibited. 10. Adopted CDC guidelines & recommendations: Target dosing levels will be at or below 60 MME/day. Use of benzodiazepines** is not recommended.  Exceptions: There are only two exceptions to the rule of not receiving pain medications from other Healthcare Providers. 1. Exception #1 (Emergencies): In the event of an emergency (i.e.: accident requiring emergency care), you are allowed to receive additional pain medication. However, you are responsible for: As soon as you are able, call our office [phone], at any  time of the day or night, and leave a message stating your name, the date and  nature of the emergency, and the name and dose of the medication prescribed. In the event that your call is answered by a member of our staff, make sure to document and save the date, time, and the name of the person that took your information.  2. Exception #2 (Planned Surgery): In the event that you are scheduled by another doctor or dentist to have any type of surgery or procedure, you are allowed (for a period no longer than 30 days), to receive additional pain medication, for the acute post-op pain. However, in this case, you are responsible for picking up a copy of our "Post-op Pain Management for Surgeons" handout, and giving it to your surgeon or dentist. This document is available at our office, and does not require an appointment to obtain it. Simply go to our office during business hours (Monday-Thursday from 8:00 AM to 4:00 PM) (Friday 8:00 AM to 12:00 Noon) or if you have a scheduled appointment with [place], prior to your surgery, and ask for it by name. In addition, you will need to provide [place] with your name, name of your surgeon, type of surgery, and date of procedure or surgery.  *Opioid medications include: morphine, codeine, oxycodone, oxymorphone, hydrocodone, hydromorphone, meperidine, tramadol, tapentadol, buprenorphine, fentanyl, methadone. **Benzodiazepine medications include: diazepam (Valium), alprazolam (Xanax), clonazepam (Klonopine), lorazepam (Ativan), clorazepate (Tranxene), chlordiazepoxide (Librium), estazolam (Prosom), oxazepam (Serax), temazepam (Restoril), triazolam (Halcion) (Last updated: 04/01/2017) ____________________________________________________________________________________________

## 2017-09-15 NOTE — Progress Notes (Signed)
  Safety precautions to be maintained throughout the outpatient stay will include: orient to surroundings, keep bed in low position, maintain call bell within reach at all times, provide assistance with transfer out of bed and ambulation.   Nursing Pain Medication Assessment:  Safety precautions to be maintained throughout the outpatient stay will include: orient to surroundings, keep bed in low position, maintain call bell within reach at all times, provide assistance with transfer out of bed and ambulation.  Medication Inspection Compliance: Pill count conducted under aseptic conditions, in front of the patient. Neither the pills nor the bottle was removed from the patient's sight at any time. Once count was completed pills were immediately returned to the patient in their original bottle.  Medication: Oxycodone 10mg  Pill/Patch Count: 4 of 120 pills remain Pill/Patch Appearance: Markings consistent with prescribed medication Bottle Appearance: Standard pharmacy container. Clearly labeled. Filled Date: 05 / 21 / 2019 Last Medication intake:  Today

## 2017-09-17 ENCOUNTER — Other Ambulatory Visit: Payer: Self-pay | Admitting: Family Medicine

## 2017-09-23 DIAGNOSIS — E119 Type 2 diabetes mellitus without complications: Secondary | ICD-10-CM | POA: Diagnosis not present

## 2017-09-23 DIAGNOSIS — L851 Acquired keratosis [keratoderma] palmaris et plantaris: Secondary | ICD-10-CM | POA: Diagnosis not present

## 2017-09-23 DIAGNOSIS — B351 Tinea unguium: Secondary | ICD-10-CM | POA: Diagnosis not present

## 2017-09-27 ENCOUNTER — Encounter: Payer: Self-pay | Admitting: Family Medicine

## 2017-09-27 ENCOUNTER — Ambulatory Visit (INDEPENDENT_AMBULATORY_CARE_PROVIDER_SITE_OTHER): Payer: Medicare Other | Admitting: Family Medicine

## 2017-09-27 VITALS — BP 120/80 | HR 80 | Temp 98.0°F | Ht 77.0 in | Wt 258.2 lb

## 2017-09-27 DIAGNOSIS — E785 Hyperlipidemia, unspecified: Secondary | ICD-10-CM | POA: Diagnosis not present

## 2017-09-27 DIAGNOSIS — Z130 Encounter for screening for diseases of the blood and blood-forming organs and certain disorders involving the immune mechanism: Secondary | ICD-10-CM | POA: Diagnosis not present

## 2017-09-27 DIAGNOSIS — F419 Anxiety disorder, unspecified: Secondary | ICD-10-CM

## 2017-09-27 DIAGNOSIS — Z794 Long term (current) use of insulin: Secondary | ICD-10-CM

## 2017-09-27 DIAGNOSIS — E1142 Type 2 diabetes mellitus with diabetic polyneuropathy: Secondary | ICD-10-CM

## 2017-09-27 DIAGNOSIS — Z1329 Encounter for screening for other suspected endocrine disorder: Secondary | ICD-10-CM | POA: Diagnosis not present

## 2017-09-27 DIAGNOSIS — Z0001 Encounter for general adult medical examination with abnormal findings: Secondary | ICD-10-CM | POA: Diagnosis not present

## 2017-09-27 DIAGNOSIS — Z125 Encounter for screening for malignant neoplasm of prostate: Secondary | ICD-10-CM | POA: Diagnosis not present

## 2017-09-27 DIAGNOSIS — F329 Major depressive disorder, single episode, unspecified: Secondary | ICD-10-CM

## 2017-09-27 LAB — COMPREHENSIVE METABOLIC PANEL
ALK PHOS: 82 U/L (ref 39–117)
ALT: 39 U/L (ref 0–53)
AST: 27 U/L (ref 0–37)
Albumin: 4.1 g/dL (ref 3.5–5.2)
BUN: 16 mg/dL (ref 6–23)
CHLORIDE: 97 meq/L (ref 96–112)
CO2: 31 meq/L (ref 19–32)
Calcium: 10 mg/dL (ref 8.4–10.5)
Creatinine, Ser: 1.11 mg/dL (ref 0.40–1.50)
GFR: 83.83 mL/min (ref >60.00)
GLUCOSE: 133 mg/dL — AB (ref 70–99)
POTASSIUM: 4.2 meq/L (ref 3.5–5.1)
SODIUM: 138 meq/L (ref 135–145)
TOTAL PROTEIN: 7.2 g/dL (ref 6.0–8.3)
Total Bilirubin: 0.4 mg/dL (ref 0.2–1.2)

## 2017-09-27 LAB — LIPID PANEL
CHOL/HDL RATIO: 2
Cholesterol: 120 mg/dL (ref 0–200)
HDL: 50.4 mg/dL (ref >39.00)
LDL CALC: 51 mg/dL (ref 0–99)
NONHDL: 69.71
Triglycerides: 95 mg/dL (ref 0.0–149.0)
VLDL: 19 mg/dL (ref 0.0–40.0)

## 2017-09-27 LAB — HEMOGLOBIN A1C: HEMOGLOBIN A1C: 8.3 % — AB (ref 4.6–6.5)

## 2017-09-27 LAB — CBC
HEMATOCRIT: 43.9 % (ref 39.0–52.0)
Hemoglobin: 14.1 g/dL (ref 13.0–17.0)
MCHC: 32.2 g/dL (ref 30.0–36.0)
MCV: 88.8 fl (ref 78.0–100.0)
Platelets: 255 10*3/uL (ref 150.0–400.0)
RBC: 4.95 Mil/uL (ref 4.22–5.81)
RDW: 16 % — ABNORMAL HIGH (ref 11.5–15.5)
WBC: 7.7 10*3/uL (ref 4.0–10.5)

## 2017-09-27 LAB — TSH: TSH: 2.64 u[IU]/mL (ref 0.35–4.50)

## 2017-09-27 LAB — PSA, MEDICARE: PSA: 1.59 ng/ml (ref 0.10–4.00)

## 2017-09-27 MED ORDER — PNEUMOCOCCAL VAC POLYVALENT 25 MCG/0.5ML IJ INJ: 0.5000 mL | INJECTION | Freq: Once | INTRAMUSCULAR | 0 refills | Status: AC

## 2017-09-27 NOTE — Progress Notes (Signed)
Alan Rumps, MD Phone: [phone]  Alan Schmidt is a 72 y.o. male who presents today for CPE.  Tries to walk some for exercise. Not really watching what he eats. Has completed stool testing for colon cancer screening. No family history of prostate cancer. No prior hepatitis C screening and he declines this. Due for Pneumovax.  Tetanus vaccination up-to-date.  Due for Shingrix. Saw his ophthalmologist 4 months ago. Sees a dentist. He smoked half a pack a day for a couple of years though quit 40 years ago.  No alcohol use or illicit drug use. Patient reports some slight increase in anxiety.  Some mild depression.  Does not rest well at night.  He wonders about increasing his dose of Xanax.  He is been on Prozac for some time.  No SI.  Active Ambulatory Problems    Diagnosis Date Noted  . Paraparesis ([place]) 05/02/2014  . Chronic low back pain (Primary Source of Pain) (Bilateral) (R>L) 12/05/2014  . Lumbar facet syndrome (Bilateral) (R>L) 12/05/2014  . Lumbar spondylosis 12/05/2014  . Diabetic peripheral neuropathy ([place]) 12/05/2014  . Long term current use of opiate analgesic 12/05/2014  . Long term prescription opiate use 12/05/2014  . Opiate use (60 MME/Day) 12/05/2014  . Opiate dependence ([place]) 12/05/2014  . Encounter for therapeutic drug level monitoring 12/05/2014  . Chronic neck pain (midline over the C7 spinous processes) (L>R) 12/05/2014  . Neurogenic pain 12/05/2014  . Neuropathic pain 12/05/2014  . Contrast dye allergy 12/05/2014  . Chronic lower extremity pain (Secondary source of pain) (Bilateral) (R>L) 12/05/2014  . 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) (Right L5 dermatome) 12/05/2014  . 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 Chronic hip pain (Right) 12/05/2014  . Class I Morbid obesity ([place]) (68% higher incidence of chronic low back pain) 12/05/2014  . Essential hypertension 12/05/2014  . GERD (gastroesophageal reflux disease)  12/05/2014  . Obstructive sleep apnea 12/05/2014  . Hyperlipidemia 12/05/2014  . Chronic kidney disease (CKD) 12/05/2014  . Abnormal nerve conduction studies (severe bilateral lower extremity polyneuropathy) 12/05/2014  . Chronic shoulder pain (Bilateral) (R>L) 03/06/2015  . Occipital neuralgia (Left) 03/06/2015  . Cervicogenic headache (Left) 03/06/2015  . Diabetes ([place]) 03/19/2015  . BPH (benign prostatic hyperplasia) 04/17/2015  . History of Vasovagal response to spinal injections 04/30/2015  . Allergic rhinitis 05/22/2015  . Anxiety and depression 07/17/2015  . Disturbance of skin sensation 11/13/2015  . Chronic shoulder radicular pain (Left) 12/18/2015  . Chronic cervical radicular pain (Left) 12/18/2015  . Chronic pain syndrome 02/13/2016  . Opioid-induced constipation ([facility]) 05/14/2016  . Encounter for general adult medical examination with abnormal findings 09/25/2016  . History of postoperative nausea and vomiting 11/24/2016  . Nevus 12/10/2016  . Lumbar facet joint osteoarthritis (Bilateral) 02/16/2017  . History of allergy to radiographic contrast media 02/16/2017  . Hypotension during surgery 02/16/2017  . Chronic headache 03/16/2017  . Chronic tension-type headache, intractable 02/24/2017  . Vertigo 02/24/2017  . Coccygodynia 04/19/2017  . DDD (degenerative disc disease), lumbar 04/19/2017  . Chronic musculoskeletal pain 04/19/2017  . Chronic pain of both knees 05/18/2017   Resolved Ambulatory Problems    Diagnosis Date Noted  . Polyneuropathy 05/02/2014  . 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 Generalized anxiety disorder 12/05/2014  . Depression 12/05/2014  . Right ear pain 04/17/2015  . Prostate cancer screening 11/21/2015  . Cerumen impaction 03/23/2016  . Acute postoperative pain 11/24/2016  . Ear pain, left 12/10/2016  . Boil 12/10/2016  Past Medical History:  Diagnosis Date  . Anxiety   . Chronic hip pain (Right) 12/05/2014  .  Chronic lumbar pain   . Hypertension   . Kidney stones   . Migraines     Family History  Problem Relation Age of Onset  . Cancer Mother   . Heart disease Father   . Stroke Father     Social History   Socioeconomic History  . Marital status: Divorced    Spouse name: Not on file  . Number of children: Not on file  . Years of education: Not on file  . Highest education level: Not on file  Occupational History  . Not on file  Social Needs  . Financial resource strain: Not on file  . Food insecurity:    Worry: Not on file    Inability: Not on file  . Transportation needs:    Medical: Not on file    Non-medical: Not on file  Tobacco Use  . Smoking status: Former Research scientist (life sciences)  . Smokeless tobacco: Never Used  Substance and Sexual Activity  . Alcohol use: No    Alcohol/week: 0.0 standard drinks  . Drug use: No  . Sexual activity: Not on file  Lifestyle  . Physical activity:    Days per week: Not on file    Minutes per session: Not on file  . Stress: Not on file  Relationships  . Social connections:    Talks on phone: Not on file    Gets together: Not on file    Attends religious service: Not on file    Active member of club or organization: Not on file    Attends meetings of clubs or organizations: Not on file    Relationship status: Not on file  . Intimate partner violence:    Fear of current or ex partner: Not on file    Emotionally abused: Not on file    Physically abused: Not on file    Forced sexual activity: Not on file  Other Topics Concern  . Not on file  Social History Narrative  . Not on file    ROS  General:  Negative for nexplained weight loss, fever Skin: Negative for new or changing mole, sore that won't heal HEENT: Negative for trouble hearing, trouble seeing, ringing in ears, mouth sores, hoarseness, change in voice, dysphagia. CV:  Negative for chest pain, dyspnea, edema, palpitations Resp: Negative for cough, dyspnea, hemoptysis GI: Negative  for nausea, vomiting, diarrhea, constipation, abdominal pain, melena, hematochezia. GU: Negative for dysuria, incontinence, urinary hesitance, hematuria, vaginal or penile discharge, polyuria, sexual difficulty, lumps in testicle or breasts MSK: Negative for muscle cramps or aches, joint pain or swelling Neuro: Negative for headaches, weakness, numbness, dizziness, passing out/fainting Psych: Positive for anxiety, depression, negative for memory problems  Objective  Physical Exam Vitals:   09/27/17 0842  BP: 120/80  Pulse: 80  Temp: 98 F (36.7 C)  SpO2: 95%    BP Readings from Last 3 Encounters:  09/27/17 120/80  09/15/17 137/86  05/18/17 (!) 145/88   Wt Readings from Last 3 Encounters:  09/27/17 258 lb 3.2 oz (117.1 kg)  09/15/17 250 lb (113.4 kg)  05/18/17 250 lb (113.4 kg)    Physical Exam  Constitutional: No distress.  HENT:  Head: Normocephalic and atraumatic.  Mouth/Throat: Oropharynx is clear and moist.  Eyes: Pupils are equal, round, and reactive to light. Conjunctivae are normal.  Neck: Neck supple.  Cardiovascular: Normal rate, regular rhythm and normal  heart sounds.  Pulmonary/Chest: Effort normal and breath sounds normal.  Abdominal: Soft. Bowel sounds are normal. He exhibits no distension. There is no tenderness. There is no rebound and no guarding.  Musculoskeletal: He exhibits no edema.  Lymphadenopathy:    He has no cervical adenopathy.  Neurological: He is alert.  Skin: Skin is warm and dry. He is not diaphoretic.  Psychiatric:  Mood anxious, affect normal     Assessment/Plan:   Encounter for general adult medical examination with abnormal findings Physical exam completed.  Given dietary guidelines.  Encouraged exercise.  Prescription for Pneumovax given.  Encouraged to get Shingrix at the pharmacy.  Lab work as outlined below.  Anxiety and depression Somewhat worsened.  I discussed that increasing his Xanax is not the best option particularly  given that he is on chronic pain medication.  I would like to transition him from Prozac to Lexapro.  We will discuss with our clinical pharmacist and then determine the best way to complete this taper.   Orders Placed This Encounter  Procedures  . Comp Met (CMET)  . Lipid panel  . HgB A1c  . PSA, Medicare  . CBC  . TSH    Meds ordered this encounter  Medications  . pneumococcal 23 valent vaccine (PNU-IMMUNE) 25 MCG/0.5ML injection    Sig: Inject 0.5 mLs into the muscle once for 1 dose.    Dispense:  0.5 mL    Refill:  0     Alan Rumps, MD [place]

## 2017-09-27 NOTE — Assessment & Plan Note (Signed)
Physical exam completed.  Given dietary guidelines.  Encouraged exercise.  Prescription for Pneumovax given.  Encouraged to get Shingrix at the pharmacy.  Lab work as outlined below.

## 2017-09-27 NOTE — Patient Instructions (Addendum)
Nice to see you. Please try to stay as active as possible. Please monitor your diet.  I have included some diet instructions below. We will get lab work today and contact you with the results. We will contact you with instructions for your anxiety/depression medications.  Diet Recommendations  Starchy (carb) foods: Bread, rice, pasta, potatoes, corn, cereal, grits, crackers, bagels, muffins, all baked goods.  (Fruits, milk, and yogurt also have carbohydrate, but most of these foods will not spike your blood sugar as the starchy foods will.)  A few fruits do cause high blood sugars; use small portions of bananas (limit to 1/2 at a time), grapes, watermelon, oranges, and most tropical fruits.    Protein foods: Meat, fish, poultry, eggs, dairy foods, and beans such as pinto and kidney beans (beans also provide carbohydrate).   1. Eat at least 3 meals and 1-2 snacks per day. Never go more than 4-5 hours while awake without eating. Eat breakfast within the first hour of getting up.   2. Limit starchy foods to TWO per meal and ONE per snack. ONE portion of a starchy  food is equal to the following:   - ONE slice of bread (or its equivalent, such as half of a hamburger bun).   - 1/2 cup of a "scoopable" starchy food such as potatoes or rice.   - 15 grams of carbohydrate as shown on food label.  3. Include at every meal: a protein food, a carb food, and vegetables and/or fruit.   - Obtain twice the volume of veg's as protein or carbohydrate foods for both lunch and dinner.   - Fresh or frozen veg's are best.   - Keep frozen veg's on hand for a quick vegetable serving.

## 2017-09-27 NOTE — Assessment & Plan Note (Signed)
Somewhat worsened.  I discussed that increasing his Xanax is not the best option particularly given that he is on chronic pain medication.  I would like to transition him from Prozac to Lexapro.  We will discuss with our clinical pharmacist and then determine the best way to complete this taper.

## 2017-09-28 ENCOUNTER — Telehealth: Payer: Self-pay

## 2017-09-28 MED ORDER — ESCITALOPRAM OXALATE 10 MG PO TABS: 10.0000 mg | ORAL_TABLET | Freq: Every day | ORAL | 1 refills | Status: DC

## 2017-09-28 MED ORDER — ALPRAZOLAM 0.5 MG PO TABS: 0.5000 mg | ORAL_TABLET | Freq: Two times a day (BID) | ORAL | 1 refills | Status: DC | PRN

## 2017-09-28 NOTE — Telephone Encounter (Signed)
-----   Message from De Hollingshead, [facility] sent at 09/28/2017 11:32 AM EDT ----- Marykay Lex,   I would be concerned about not having a washout period if the patient was switching to an SSRI that had more CYP2D6 elimination (like paroxetine), since fluoxetine is a 2D6 inhibitor. Since escitalopram doesn't have that issue, I think you would be fine to d/c fluoxetine and start escitalopram 10 mg the next day.   [name]  ----- Message ----- From: [name], MD Sent: 09/27/2017   6:08 PM EDT To: De Hollingshead, [name],   I wanted to get you input on another SSRI taper. This guy is on prozac and I would like to change him to lexparo. From what I remember prozac is self tapering and can just be stopped. If that is correct would it be ok to stop the prozac and then the next day have him start on the lexapro? Or should there be a gap in between stopping the prozac and starting the lexapro? Thanks.  Randall Hiss

## 2017-09-28 NOTE — Telephone Encounter (Signed)
Patient was seen yesterday, he states you had mentioned increasing his Prozac? He would like this sent to the pharmacy and also a refill on alprazolam.

## 2017-09-28 NOTE — Telephone Encounter (Signed)
I was awaiting a response from our clinical pharmacist.  We are going to discontinue his Prozac.  He will discontinue this and then the next day he will start on Lexapro which has been sent to his pharmacy.  I have refilled his Xanax.  Controlled substance database reviewed.

## 2017-09-29 NOTE — Telephone Encounter (Signed)
Left message to return call, ok for pec to speak to patient about message below 

## 2017-09-30 ENCOUNTER — Other Ambulatory Visit: Payer: Self-pay | Admitting: Family Medicine

## 2017-09-30 MED ORDER — EMPAGLIFLOZIN 25 MG PO TABS: 25.0000 mg | ORAL_TABLET | Freq: Every day | ORAL | 3 refills | Status: DC

## 2017-10-08 NOTE — Telephone Encounter (Signed)
Left message for patient to call office PEC may advise patient of PCP message.

## 2017-10-13 ENCOUNTER — Other Ambulatory Visit: Payer: Self-pay | Admitting: Family Medicine

## 2017-10-15 ENCOUNTER — Other Ambulatory Visit: Payer: Self-pay | Admitting: Family Medicine

## 2017-10-23 ENCOUNTER — Other Ambulatory Visit: Payer: Self-pay | Admitting: Family Medicine

## 2017-10-28 NOTE — Telephone Encounter (Signed)
Per DPR left detailed message on cell phone .

## 2017-11-13 ENCOUNTER — Other Ambulatory Visit: Payer: Self-pay | Admitting: Family Medicine

## 2017-11-16 LAB — FECAL OCCULT BLOOD, IMMUNOCHEMICAL: IMMUNOLOGICAL FECAL OCCULT BLOOD TEST: NEGATIVE

## 2017-12-07 ENCOUNTER — Ambulatory Visit: Payer: Medicare Other | Attending: Nurse Practitioner | Admitting: Nurse Practitioner

## 2017-12-07 ENCOUNTER — Encounter: Payer: Self-pay | Admitting: Nurse Practitioner

## 2017-12-07 ENCOUNTER — Other Ambulatory Visit: Payer: Self-pay

## 2017-12-07 VITALS — BP 124/76 | HR 89 | Temp 97.7°F | Resp 18 | Ht 73.0 in | Wt 250.0 lb

## 2017-12-07 DIAGNOSIS — I129 Hypertensive chronic kidney disease with stage 1 through stage 4 chronic kidney disease, or unspecified chronic kidney disease: Secondary | ICD-10-CM | POA: Insufficient documentation

## 2017-12-07 DIAGNOSIS — Z96641 Presence of right artificial hip joint: Secondary | ICD-10-CM | POA: Insufficient documentation

## 2017-12-07 DIAGNOSIS — M5481 Occipital neuralgia: Secondary | ICD-10-CM | POA: Insufficient documentation

## 2017-12-07 DIAGNOSIS — Z79899 Other long term (current) drug therapy: Secondary | ICD-10-CM | POA: Diagnosis not present

## 2017-12-07 DIAGNOSIS — G894 Chronic pain syndrome: Secondary | ICD-10-CM | POA: Diagnosis not present

## 2017-12-07 DIAGNOSIS — J309 Allergic rhinitis, unspecified: Secondary | ICD-10-CM | POA: Diagnosis not present

## 2017-12-07 DIAGNOSIS — G4733 Obstructive sleep apnea (adult) (pediatric): Secondary | ICD-10-CM | POA: Diagnosis not present

## 2017-12-07 DIAGNOSIS — Z794 Long term (current) use of insulin: Secondary | ICD-10-CM | POA: Insufficient documentation

## 2017-12-07 DIAGNOSIS — M5416 Radiculopathy, lumbar region: Secondary | ICD-10-CM

## 2017-12-07 DIAGNOSIS — Z79891 Long term (current) use of opiate analgesic: Secondary | ICD-10-CM | POA: Diagnosis not present

## 2017-12-07 DIAGNOSIS — M4726 Other spondylosis with radiculopathy, lumbar region: Secondary | ICD-10-CM | POA: Insufficient documentation

## 2017-12-07 DIAGNOSIS — M25551 Pain in right hip: Secondary | ICD-10-CM

## 2017-12-07 DIAGNOSIS — E785 Hyperlipidemia, unspecified: Secondary | ICD-10-CM | POA: Insufficient documentation

## 2017-12-07 DIAGNOSIS — M545 Low back pain: Secondary | ICD-10-CM | POA: Diagnosis present

## 2017-12-07 DIAGNOSIS — Z7982 Long term (current) use of aspirin: Secondary | ICD-10-CM | POA: Diagnosis not present

## 2017-12-07 DIAGNOSIS — G8929 Other chronic pain: Secondary | ICD-10-CM

## 2017-12-07 DIAGNOSIS — N400 Enlarged prostate without lower urinary tract symptoms: Secondary | ICD-10-CM | POA: Insufficient documentation

## 2017-12-07 DIAGNOSIS — M7918 Myalgia, other site: Secondary | ICD-10-CM

## 2017-12-07 DIAGNOSIS — G8220 Paraplegia, unspecified: Secondary | ICD-10-CM | POA: Insufficient documentation

## 2017-12-07 DIAGNOSIS — E1122 Type 2 diabetes mellitus with diabetic chronic kidney disease: Secondary | ICD-10-CM | POA: Diagnosis not present

## 2017-12-07 DIAGNOSIS — E1142 Type 2 diabetes mellitus with diabetic polyneuropathy: Secondary | ICD-10-CM | POA: Diagnosis not present

## 2017-12-07 DIAGNOSIS — M47816 Spondylosis without myelopathy or radiculopathy, lumbar region: Secondary | ICD-10-CM

## 2017-12-07 DIAGNOSIS — N189 Chronic kidney disease, unspecified: Secondary | ICD-10-CM | POA: Insufficient documentation

## 2017-12-07 DIAGNOSIS — K219 Gastro-esophageal reflux disease without esophagitis: Secondary | ICD-10-CM | POA: Insufficient documentation

## 2017-12-07 MED ORDER — OXYCODONE HCL 10 MG PO TABS: 10.0000 mg | ORAL_TABLET | Freq: Four times a day (QID) | ORAL | 0 refills | Status: DC | PRN

## 2017-12-07 MED ORDER — CYCLOBENZAPRINE HCL 10 MG PO TABS: 10.0000 mg | ORAL_TABLET | Freq: Three times a day (TID) | ORAL | 2 refills | Status: DC | PRN

## 2017-12-07 NOTE — Progress Notes (Signed)
Nursing Pain Medication Assessment:  Safety precautions to be maintained throughout the outpatient stay will include: orient to surroundings, keep bed in low position, maintain call bell within reach at all times, provide assistance with transfer out of bed and ambulation.  Medication Inspection Compliance: Pill count conducted under aseptic conditions, in front of the patient. Neither the pills nor the bottle was removed from the patient's sight at any time. Once count was completed pills were immediately returned to the patient in their original bottle.  Medication: Oxycodone IR Pill/Patch Count: 34 of 120 pills remain Pill/Patch Appearance: Markings consistent with prescribed medication Bottle Appearance: Standard pharmacy container. Clearly labeled. Filled Date: 10 / 4 / 2019 Last Medication intake:  Today

## 2017-12-07 NOTE — Progress Notes (Signed)
Patient's Name: Alan Schmidt  MRN: [id number]  Referring Provider: [name], MD  DOB: 1945/11/04  PCP: [name], MD  DOS: 12/07/2017  Note by: Vevelyn Francois NP  Service setting: Ambulatory outpatient  Specialty: Interventional Pain Management  Location: [facility] (AMB) Pain Management Facility    Patient type: Established    Primary Reason(s) for Visit: Encounter for prescription drug management. (Level of risk: moderate)  CC: Back Pain (lower)  HPI  Alan Schmidt is a 72 y.o. year old, male patient, who comes today for a medication management evaluation. He has Paraparesis ([place]); Chronic low back pain (Primary Source of Pain) (Bilateral) (R>L); Lumbar facet syndrome (Bilateral) (R>L); Lumbar spondylosis; Diabetic peripheral neuropathy ([place]); Long term current use of opiate analgesic; Long term prescription opiate use; Opiate use (60 MME/Day); Opiate dependence ([place]); Encounter for therapeutic drug level monitoring; Chronic neck pain (midline over the C7 spinous processes) (L>R); Neurogenic pain; Neuropathic pain; Contrast dye allergy; Chronic lower extremity pain (Secondary source of pain) (Bilateral) (R>L); 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) (Right L5 dermatome); History of total hip replacement (Right); Chronic hip pain (Right); Class I Morbid obesity (HCC) (68% higher incidence of chronic low back pain); Essential hypertension; GERD (gastroesophageal reflux disease); Obstructive sleep apnea; Hyperlipidemia; Chronic kidney disease (CKD); Abnormal nerve conduction studies (severe bilateral lower extremity polyneuropathy); Chronic shoulder pain (Bilateral) (R>L); Occipital neuralgia (Left); Cervicogenic headache (Left); Diabetes ([place]); BPH (benign prostatic hyperplasia); History of Vasovagal response to spinal injections; Allergic rhinitis; Anxiety and depression; Disturbance of skin sensation; Chronic shoulder radicular pain (Left); Chronic cervical radicular pain (Left);  Chronic pain syndrome; Opioid-induced constipation (OIC); Encounter for general adult medical examination with abnormal findings; History of postoperative nausea and vomiting; Nevus; Lumbar facet joint osteoarthritis (Bilateral); History of allergy to radiographic contrast media; Hypotension during surgery; Chronic headache; Chronic tension-type headache, intractable; Vertigo; Coccygodynia; DDD (degenerative disc disease), lumbar; Chronic musculoskeletal pain; and Chronic pain of both knees on their problem list. His primarily concern today is the Back Pain (lower)  Pain Assessment: Location: Lower, Right, Left Back Radiating: hips/buttocks bilateral Onset: More than a month ago Duration: Chronic pain Quality: Aching, Discomfort, Nagging, Sharp, Constant, Throbbing("deep") Severity:  4/10 (subjective, self-reported pain score)  Note: Reported level is compatible with observation.                          Effect on ADL: prolonged walking, prolonged standing, some household chores, leaning forward Timing: Constant Modifying factors: medications BP: 124/76  HR: 89  Alan Schmidt was last scheduled for an appointment on 09/15/2017 for medication management. During today's appointment we reviewed Alan Schmidt's chronic pain status, as well as his outpatient medication regimen. He admits that he has increased pain with walking. He admits that he has tired to hold off on his next procedure but he needs to have this repeat.   The patient  reports that he does not use drugs. His body mass index is 32.98 kg/m.  Further details on both, my assessment(s), as well as the proposed treatment plan, please see below.  Controlled Substance Pharmacotherapy Assessment REMS (Risk Evaluation and Mitigation Strategy)  Analgesic:Oxycodone IR 10 mg every 6 hours (40 mg/day) MME/day:60 mg/day.  Alan Specking, Alan Schmidt  12/07/2017  1:55 PM  Sign at close encounter Nursing Pain Medication Assessment:  Safety  precautions to be maintained throughout the outpatient stay will include: orient to surroundings, keep bed in low position, maintain call bell within reach  at all times, provide assistance with transfer out of bed and ambulation.  Medication Inspection Compliance: Pill count conducted under aseptic conditions, in front of the patient. Neither the pills nor the bottle was removed from the patient's sight at any time. Once count was completed pills were immediately returned to the patient in their original bottle.  Medication: Oxycodone IR Pill/Patch Count: 34 of 120 pills remain Pill/Patch Appearance: Markings consistent with prescribed medication Bottle Appearance: Standard pharmacy container. Clearly labeled. Filled Date: 10 / 4 / 2019 Last Medication intake:  Today   Pharmacokinetics: Liberation and absorption (onset of action): WNL Distribution (time to peak effect): WNL Metabolism and excretion (duration of action): WNL         Pharmacodynamics: Desired effects: Analgesia: Alan Schmidt reports >50% benefit. Functional ability: Patient reports that medication allows him to accomplish basic ADLs Clinically meaningful improvement in function (CMIF): Sustained CMIF goals met Perceived effectiveness: Described as relatively effective, allowing for increase in activities of daily living (ADL) Undesirable effects: Side-effects or Adverse reactions: None reported Monitoring: [place] PMP: Online review of the past 42-monthperiod conducted. Compliant with practice rules and regulations Last UDS on record: Summary  Date Value Ref Range Status  05/18/2017 FINAL  Final    Comment:    ==================================================================== TOXASSURE SELECT 13 (MW) ==================================================================== Test                             Result       Flag       Units Drug Present and Declared for Prescription Verification   Alprazolam                     39            EXPECTED   ng/mg creat   Alpha-hydroxyalprazolam        54           EXPECTED   ng/mg creat    Source of alprazolam is a scheduled prescription medication.    Alpha-hydroxyalprazolam is an expected metabolite of alprazolam.   Oxycodone                      3568         EXPECTED   ng/mg creat   Oxymorphone                    63           EXPECTED   ng/mg creat   Noroxycodone                   2463         EXPECTED   ng/mg creat    Sources of oxycodone include scheduled prescription medications.    Oxymorphone and noroxycodone are expected metabolites of    oxycodone. Oxymorphone is also available as a scheduled    prescription medication. ==================================================================== Test                      Result    Flag   Units      Ref Range   Creatinine              98               mg/dL      >=20 ==================================================================== Declared Medications:  The flagging and interpretation on this report are based on  the  following declared medications.  Unexpected results may arise from  inaccuracies in the declared medications.  **Note: The testing scope of this panel includes these medications:  Alprazolam  Oxycodone  **Note: The testing scope of this panel does not include following  reported medications:  Acetaminophen  Amlodipine  Aspirin  Carvedilol  Cyclobenzaprine  Desoximetasone  Empagliflozin  Fluoxetine  Hydrochlorothiazide (HCTZ)  Hydroxyzine  Insulin  Liraglutide (Victoza)  Loratadine  Magnesium  Metformin  Mometasone  Naloxone  Olmesartan  Pantoprazole  Pravastatin  Tamsulosin ==================================================================== For clinical consultation, please call [phone]. ====================================================================    UDS interpretation: Compliant          Medication Assessment Form: Reviewed. Patient indicates being compliant with  therapy Treatment compliance: Compliant Risk Assessment Profile: Aberrant behavior: See prior evaluations. None observed or detected today Comorbid factors increasing risk of overdose: See prior notes. No additional risks detected today Opioid risk tool (ORT) (Total Score): 0 Personal History of Substance Abuse (SUD-Substance use disorder):  Alcohol: Negative  Illegal Drugs: Negative  Rx Drugs: Negative  ORT Risk Level calculation: Low Risk Risk of substance use disorder (SUD): Low Opioid Risk Tool - 12/07/17 1349      Family History of Substance Abuse   Alcohol  Negative    Illegal Drugs  Negative    Rx Drugs  Negative      Personal History of Substance Abuse   Alcohol  Negative    Illegal Drugs  Negative    Rx Drugs  Negative      Age   Age between 94-45 years   No      Psychological Disease   Psychological Disease  Negative    Depression  Negative      Total Score   Opioid Risk Tool Scoring  0    Opioid Risk Interpretation  Low Risk      ORT Scoring interpretation table:  Score <3 = Low Risk for SUD  Score between 4-7 = Moderate Risk for SUD  Score >8 = High Risk for Opioid Abuse   Risk Mitigation Strategies:  Patient Counseling: Covered Patient-Prescriber Agreement (PPA): Present and active  Notification to other healthcare providers: Done  Pharmacologic Plan: No change in therapy, at this time.             Laboratory Chemistry  Inflammation Markers (CRP: Acute Phase) (ESR: Chronic Phase) Lab Results  Component Value Date   CRP 0.5 03/06/2015   ESRSEDRATE 45 (H) 12/14/2016                         Rheumatology Markers No results found for: RF, ANA, LABURIC, URICUR, LYMEIGGIGMAB, LYMEABIGMQN, HLAB27                      Renal Function Markers Lab Results  Component Value Date   BUN 16 09/27/2017   CREATININE 1.11 09/27/2017   GFRAA >60 10/25/2016   GFRNONAA >60 10/25/2016                             Hepatic Function Markers Lab Results   Component Value Date   AST 27 09/27/2017   ALT 39 09/27/2017   ALBUMIN 4.1 09/27/2017   ALKPHOS 82 09/27/2017                        Electrolytes Lab Results  Component Value Date  NA 138 09/27/2017   K 4.2 09/27/2017   CL 97 09/27/2017   CALCIUM 10.0 09/27/2017   MG 1.8 03/06/2015                        Neuropathy Markers Lab Results  Component Value Date   HGBA1C 8.3 (H) 09/27/2017                        CNS Tests No results found for: COLORCSF, APPEARCSF, RBCCOUNTCSF, WBCCSF, POLYSCSF, LYMPHSCSF, EOSCSF, PROTEINCSF, GLUCCSF, JCVIRUS, CSFOLI, IGGCSF                      Bone Pathology Markers No results found for: VD25OH, [id number], VK1840RF5, OH6067PC3, 25OHVITD1, 25OHVITD2, 25OHVITD3, TESTOFREE, TESTOSTERONE                       Coagulation Parameters Lab Results  Component Value Date   PLT 255.0 09/27/2017                        Cardiovascular Markers Lab Results  Component Value Date   HGB 14.1 09/27/2017   HCT 43.9 09/27/2017                         CA Markers No results found for: CEA, CA125, LABCA2                      Note: Lab results reviewed.  Recent Diagnostic Imaging Results  DG C-Arm 1-60 Min-No Report Fluoroscopy was utilized by the requesting physician.  No radiographic  interpretation.   Complexity Note: Imaging results reviewed. Results shared with Mr. Grandville Silos, using Layman's terms.                         Meds   Current Outpatient Medications:  .  ACCU-CHEK FASTCLIX LANCETS MISC, CHECK 4 TIMES A DAY, Disp: 102 each, Rfl: 2 .  ACCU-CHEK FASTCLIX LANCETS MISC, CHECK 4 TIMES A DAY, Disp: 102 each, Rfl: 0 .  acetaminophen (TYLENOL) 500 MG tablet, Take 500 mg by mouth every 6 (six) hours as needed., Disp: , Rfl:  .  ALPRAZolam (XANAX) 0.5 MG tablet, Take 1 tablet (0.5 mg total) by mouth 2 (two) times daily as needed., Disp: 60 tablet, Rfl: 1 .  amLODipine (NORVASC) 5 MG tablet, Take 1 tablet (5 mg total) by mouth daily., Disp: 90  tablet, Rfl: 3 .  aspirin EC 325 MG tablet, Take 325 mg by mouth daily. , Disp: , Rfl:  .  BD PEN NEEDLE NANO U/F 32G X 4 MM MISC, USE EVERY DAY, Disp: 100 each, Rfl: 4 .  blood glucose meter kit and supplies KIT, Accu-Chek guide meter and Accu-Chek guide test strips with Accu-Chek fast clix lancets, check blood sugars 4 times daily, ICD 10 code E11.9, Disp: 1 each, Rfl: 0 .  carvedilol (COREG) 25 MG tablet, TAKE 1 TABLET BY MOUTH TWICE A DAY, Disp: 180 tablet, Rfl: 1 .  cyclobenzaprine (FLEXERIL) 10 MG tablet, Take 1 tablet (10 mg total) by mouth 3 (three) times daily as needed for muscle spasms. Must last 30 days., Disp: 90 tablet, Rfl: 2 .  desoximetasone (TOPICORT) 0.25 % cream, APPLY CREAM TO AFFECTED AREA TWO TIMES DAILY, Disp: , Rfl: 3 .  empagliflozin (JARDIANCE) 25 MG TABS tablet, Take 25  mg by mouth daily., Disp: 30 tablet, Rfl: 3 .  escitalopram (LEXAPRO) 10 MG tablet, Take 1 tablet (10 mg total) by mouth daily. Start the day after stopping prozac., Disp: 90 tablet, Rfl: 1 .  glucose blood (ACCU-CHEK GUIDE) test strip, Use as instructed, Disp: 100 each, Rfl: 5 .  hydrochlorothiazide (HYDRODIURIL) 25 MG tablet, TAKE 1 TABLET BY MOUTH EVERY DAY, Disp: 90 tablet, Rfl: 1 .  hydrOXYzine (ATARAX/VISTARIL) 50 MG tablet, TAKE 1 TABLET BY MOUTH FOUR TIMES A DAY AS NEEDED, Disp: 90 tablet, Rfl: 1 .  insulin lispro (HUMALOG KWIKPEN) 100 UNIT/ML KiwkPen, Inject 0.1 mLs (10 Units total) into the skin 3 (three) times daily with meals., Disp: 15 mL, Rfl: 3 .  JARDIANCE 10 MG TABS tablet, TAKE 1 TABLET BY MOUTH EVERY DAY, Disp: 30 tablet, Rfl: 3 .  loratadine (CLARITIN) 10 MG tablet, Take 1 tablet (10 mg total) by mouth daily., Disp: 30 tablet, Rfl: 11 .  magnesium 30 MG tablet, Take 30 mg by mouth 2 (two) times daily., Disp: , Rfl:  .  metFORMIN (GLUCOPHAGE) 1000 MG tablet, TAKE 1 TABLET (1,000 MG TOTAL) BY MOUTH 2 (TWO) TIMES DAILY WITH A MEAL., Disp: 180 tablet, Rfl: 2 .  mometasone (NASONEX) 50  MCG/ACT nasal spray, PLACE 2 SPRAYS INTO THE NOSE DAILY., Disp: 17 g, Rfl: 4 .  naloxone (NARCAN) 2 MG/2ML injection, Inject content of syringe into thigh muscle. Call 911., Disp: 2 Syringe, Rfl: 1 .  olmesartan (BENICAR) 40 MG tablet, TAKE 1 TABLET BY MOUTH EVERY DAY, Disp: 90 tablet, Rfl: 1 .  Oxycodone HCl 10 MG TABS, Take 1 tablet (10 mg total) by mouth every 6 (six) hours as needed., Disp: 120 tablet, Rfl: 0 .  pantoprazole (PROTONIX) 40 MG tablet, TAKE 1 TABLET BY MOUTH EVERY DAY, Disp: 90 tablet, Rfl: 1 .  pravastatin (PRAVACHOL) 40 MG tablet, TAKE 1 TABLET BY MOUTH EVERY DAY, Disp: 90 tablet, Rfl: 1 .  tamsulosin (FLOMAX) 0.4 MG CAPS capsule, TAKE 1 CAPSULE (0.4 MG TOTAL) BY MOUTH 2 (TWO) TIMES DAILY., Disp: 180 capsule, Rfl: 2 .  TRESIBA FLEXTOUCH 100 UNIT/ML SOPN FlexTouch Pen, INJECT 40 UNITS INTO THE SKIN DAILY AT 10 PM., Disp: 15 pen, Rfl: 3 .  VICTOZA 18 MG/3ML SOPN, INJECT 0.3 MLS (1.8 MG TOTAL) INTO THE SKIN DAILY., Disp: 27 pen, Rfl: 1  ROS  Constitutional: Denies any fever or chills Gastrointestinal: No reported hemesis, hematochezia, vomiting, or acute GI distress Musculoskeletal: Denies any acute onset joint swelling, redness, loss of ROM, or weakness Neurological: No reported episodes of acute onset apraxia, aphasia, dysarthria, agnosia, amnesia, paralysis, loss of coordination, or loss of consciousness  Allergies  Alan Schmidt is allergic to contrast media [iodinated diagnostic agents]; iodine; and shellfish allergy.  [place]  Drug: Mr. Wrinkle  reports that he does not use drugs. Alcohol:  reports that he does not drink alcohol. Tobacco:  reports that he has quit smoking. He has never used smokeless tobacco. Medical:  has a past medical history of Acute postoperative pain (11/24/2016), Anxiety, Chronic hip pain (Right) (12/05/2014), Chronic lumbar pain, Depression, Hyperlipidemia, Hypertension, Kidney stones, and Migraines. Surgical: Mr. Melander  has a past surgical  history that includes Total hip arthroplasty; Tonsillectomy; and right hip surgery. Family: family history includes Cancer in his mother; Heart disease in his father; Stroke in his father.  Constitutional Exam  General appearance: Well nourished, well developed, and well hydrated. In no apparent acute distress Vitals:   12/07/17 1334  BP:  124/76  Pulse: 89  Resp: 18  Temp: 97.7 F (36.5 C)  SpO2: 99%  Weight: 250 lb (113.4 kg)  Height: 6' 1"  (1.854 m)   BMI Assessment: Estimated body mass index is 32.98 kg/m as calculated from the following:   Height as of this encounter: 6' 1"  (1.854 m).   Weight as of this encounter: 250 lb (113.4 kg). Psych/Mental status: Alert, oriented x 3 (person, place, & time)       Eyes: PERLA Respiratory: No evidence of acute respiratory distress   Lumbar Spine Area Exam  Skin & Axial Inspection: No masses, redness, or swelling Alignment: Symmetrical Functional ROM: Unrestricted ROM       Stability: No instability detected Muscle Tone/Strength: Functionally intact. No obvious neuro-muscular anomalies detected. Sensory (Neurological): Unimpaired Palpation: Complains of area being tender to palpation       Provocative Tests: Hyperextension/rotation test: Positive bilaterally for facet joint pain.  Gait & Posture Assessment  Ambulation: Unassisted Gait: Antalgic Posture: WNL   Lower Extremity Exam    Side: Right lower extremity  Side: Left lower extremity  Stability: No instability observed          Stability: No instability observed          Skin & Extremity Inspection: Skin color, temperature, and hair growth are WNL. No peripheral edema or cyanosis. No masses, redness, swelling, asymmetry, or associated skin lesions. No contractures.  Skin & Extremity Inspection: Skin color, temperature, and hair growth are WNL. No peripheral edema or cyanosis. No masses, redness, swelling, asymmetry, or associated skin lesions. No contractures.  Functional ROM:  Unrestricted ROM                  Functional ROM: Unrestricted ROM                  Muscle Tone/Strength: Functionally intact. No obvious neuro-muscular anomalies detected.  Muscle Tone/Strength: Functionally intact. No obvious neuro-muscular anomalies detected.  Sensory (Neurological): Unimpaired  Sensory (Neurological): Unimpaired  Palpation: No palpable anomalies  Palpation: No palpable anomalies   Assessment  Primary Diagnosis & Pertinent Problem List: The primary encounter diagnosis was Lumbar spondylosis. Diagnoses of Chronic hip pain (Right), 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) (Right L5 dermatome), Chronic musculoskeletal pain, Chronic pain syndrome, and Long term prescription opiate use were also pertinent to this visit.  Status Diagnosis  Persistent Persistent Controlled 1. Lumbar spondylosis   2. Chronic hip pain (Right)   3. 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) (Right L5 dermatome)   4. Chronic musculoskeletal pain   5. Chronic pain syndrome   6. Long term prescription opiate use     Problems updated and reviewed during this visit: No problems updated. Plan of Care  Pharmacotherapy (Medications Ordered): No orders of the defined types were placed in this encounter.  New Prescriptions   No medications on file   Medications administered today: Karmen Stabs. [place] had no medications administered during this visit. Lab-work, procedure(s), and/or referral(s): Orders Placed This Encounter  Procedures  . Radiofrequency,Lumbar  . ToxASSURE Select 13 (MW), Urine   Imaging and/or referral(s): None  Interventional therapies: Palliative right lumbar facet RFA   Considering:  Palliative bilateral lumbar facet RFA (Right done on 02/17/16 & 04/30/15) Diagnostic Left GONB Possible Left GON RFA Palliative Left CESI   Palliative PRN treatment(s): Diagnostic Left GONB    Provider-requested follow-up: Return in about 3 months (around 03/09/2018)  for MedMgmt, in addition, w/ Dr. Dossie Arbour, Procedure(w/Sedation), Right Lumbar RFA.  Future Appointments  Date Time Provider [place]  01/17/2018  1:15 PM [name], MD [facility]  03/02/2018 12:45 PM Vevelyn Francois, NP [facility] None   Primary Care Physician: [name], MD Location: [facility] Outpatient Pain Management Facility Note by: Vevelyn Francois NP Date: 12/07/2017; Time: 3:01 PM  Pain Score Disclaimer: We use the NRS-11 scale. This is a self-reported, subjective measurement of pain severity with only modest accuracy. It is used primarily to identify changes within a particular patient. It must be understood that outpatient pain scales are significantly less accurate that those used for research, where they can be applied under ideal controlled circumstances with minimal exposure to variables. In reality, the score is likely to be a combination of pain intensity and pain affect, where pain affect describes the degree of emotional arousal or changes in action readiness caused by the sensory experience of pain. Factors such as social and work situation, setting, emotional state, anxiety levels, expectation, and prior pain experience may influence pain perception and show large inter-individual differences that may also be affected by time variables.  Patient instructions provided during this appointment: Patient Instructions   ____________________________________________________________________________________________  Medication Rules  Applies to: All patients receiving prescriptions (written or electronic).  Pharmacy of record: Pharmacy where electronic prescriptions will be sent. If written prescriptions are taken to a different pharmacy, please inform the nursing staff. The pharmacy listed in the electronic medical record should be the one where you would like electronic prescriptions to be sent.  Prescription refills: Only during scheduled appointments.  Applies to both, written and electronic prescriptions.  NOTE: The following applies primarily to controlled substances (Opioid* Pain Medications).   Patient's responsibilities: 1. Pain Pills: Bring all pain pills to every appointment (except for procedure appointments). 2. Pill Bottles: Bring pills in original pharmacy bottle. Always bring newest bottle. Bring bottle, even if empty. 3. Medication refills: You are responsible for knowing and keeping track of what medications you need refilled. The day before your appointment, write a list of all prescriptions that need to be refilled. Bring that list to your appointment and give it to the admitting nurse. Prescriptions will be written only during appointments. If you forget a medication, it will not be "Called in", "Faxed", or "electronically sent". You will need to get another appointment to get these prescribed. 4. Prescription Accuracy: You are responsible for carefully inspecting your prescriptions before leaving our office. Have the discharge nurse carefully go over each prescription with you, before taking them home. Make sure that your name is accurately spelled, that your address is correct. Check the name and dose of your medication to make sure it is accurate. Check the number of pills, and the written instructions to make sure they are clear and accurate. Make sure that you are given enough medication to last until your next medication refill appointment. 5. Taking Medication: Take medication as prescribed. Never take more pills than instructed. Never take medication more frequently than prescribed. Taking less pills or less frequently is permitted and encouraged, when it comes to controlled substances (written prescriptions).  6. Inform other Doctors: Always inform, all of your healthcare providers, of all the medications you take. 7. Pain Medication from other Providers: You are not allowed to accept any additional pain medication from any  other Doctor or Healthcare provider. There are two exceptions to this rule. (see below) In the event that you require additional pain medication, you are responsible for notifying us, as stated below.  8. Medication Agreement: You are responsible for carefully reading and following our Medication Agreement. This must be signed before receiving any prescriptions from our practice. Safely store a copy of your signed Agreement. Violations to the Agreement will result in no further prescriptions. (Additional copies of our Medication Agreement are available upon request.) 9. Laws, Rules, & Regulations: All patients are expected to follow all Federal and [employer], [employer], Rules, [employer]. Ignorance of the Laws does not constitute a valid excuse. The use of any illegal substances is prohibited. 10. Adopted CDC guidelines & recommendations: Target dosing levels will be at or below 60 MME/day. Use of benzodiazepines** is not recommended.  Exceptions: There are only two exceptions to the rule of not receiving pain medications from other Healthcare Providers. 1. Exception #1 (Emergencies): In the event of an emergency (i.e.: accident requiring emergency care), you are allowed to receive additional pain medication. However, you are responsible for: As soon as you are able, call our office [phone], at any time of the day or night, and leave a message stating your name, the date and nature of the emergency, and the name and dose of the medication prescribed. In the event that your call is answered by a member of our staff, make sure to document and save the date, time, and the name of the person that took your information.  2. Exception #2 (Planned Surgery): In the event that you are scheduled by another doctor or dentist to have any type of surgery or procedure, you are allowed (for a period no longer than 30 days), to receive additional pain medication, for the acute post-op pain. However, in this case,  you are responsible for picking up a copy of our "Post-op Pain Management for Surgeons" handout, and giving it to your surgeon or dentist. This document is available at our office, and does not require an appointment to obtain it. Simply go to our office during business hours (Monday-Thursday from 8:00 AM to 4:00 PM) (Friday 8:00 AM to 12:00 Noon) or if you have a scheduled appointment with [place], prior to your surgery, and ask for it by name. In addition, you will need to provide [place] with your name, name of your surgeon, type of surgery, and date of procedure or surgery.  *Opioid medications include: morphine, codeine, oxycodone, oxymorphone, hydrocodone, hydromorphone, meperidine, tramadol, tapentadol, buprenorphine, fentanyl, methadone. **Benzodiazepine medications include: diazepam (Valium), alprazolam (Xanax), clonazepam (Klonopine), lorazepam (Ativan), clorazepate (Tranxene), chlordiazepoxide (Librium), estazolam (Prosom), oxazepam (Serax), temazepam (Restoril), triazolam (Halcion) (Last updated: 04/01/2017) ____________________________________________________________________________________________    BMI interpretation table: BMI level Category Range association with higher incidence of chronic pain  <18 kg/m2 Underweight   18.5-24.9 kg/m2 Ideal body weight   25-29.9 kg/m2 Overweight Increased incidence by 20%  30-34.9 kg/m2 Obese (Class I) Increased incidence by 68%  35-39.9 kg/m2 Severe obesity (Class II) Increased incidence by 136%  >40 kg/m2 Extreme obesity (Class III) Increased incidence by 254%   Patient's current BMI Ideal Body weight  Body mass index is 32.98 kg/m. Ideal body weight: 79.9 kg (176 lb 2.4 oz) Adjusted ideal body weight: 93.3 kg (205 lb 11 oz)   BMI Readings from Last 4 Encounters:  12/07/17 32.98 kg/m  09/27/17 30.62 kg/m  09/15/17 32.98 kg/m  05/18/17 32.98 kg/m   Wt Readings from Last 4 Encounters:  12/07/17 250 lb (113.4 kg)  09/27/17 258 lb 3.2 oz  (117.1 kg)  09/15/17 250 lb (113.4 kg)  05/18/17 250 lb (113.4 kg)  ____________________________________________________________________________________________  Preparing  for Procedure with Sedation  Instructions: . Oral Intake: Do not eat or drink anything for at least 8 hours prior to your procedure. . Transportation: Public transportation is not allowed. Bring an adult driver. The driver must be physically present in our waiting room before any procedure can be started. Marland Kitchen Physical Assistance: Bring an adult physically capable of assisting you, in the event you need help. This adult should keep you company at home for at least 6 hours after the procedure. . Blood Pressure Medicine: Take your blood pressure medicine with a sip of water the morning of the procedure. . Blood thinners: Notify our staff if you are taking any blood thinners. Depending on which one you take, there will be specific instructions on how and when to stop it. . Diabetics on insulin: Notify the staff so that you can be scheduled 1st case in the morning. If your diabetes requires high dose insulin, take only  of your normal insulin dose the morning of the procedure and notify the staff that you have done so. . Preventing infections: Shower with an antibacterial soap the morning of your procedure. . Build-up your immune system: Take 1000 mg of Vitamin C with every meal (3 times a day) the day prior to your procedure. Marland Kitchen Antibiotics: Inform the staff if you have a condition or reason that requires you to take antibiotics before dental procedures. . Pregnancy: If you are pregnant, call and cancel the procedure. . Sickness: If you have a cold, fever, or any active infections, call and cancel the procedure. . Arrival: You must be in the facility at least 30 minutes prior to your scheduled procedure. . Children: Do not bring children with you. . Dress appropriately: Bring dark clothing that you would not mind if they get  stained. . Valuables: Do not bring any jewelry or valuables.  Procedure appointments are reserved for interventional treatments only. Marland Kitchen No Prescription Refills. . No medication changes will be discussed during procedure appointments. . No disability issues will be discussed.  Reasons to call and reschedule or cancel your procedure: (Following these recommendations will minimize the risk of a serious complication.) . Surgeries: Avoid having procedures within 2 weeks of any surgery. (Avoid for 2 weeks before or after any surgery). . Flu Shots: Avoid having procedures within 2 weeks of a flu shots or . (Avoid for 2 weeks before or after immunizations). . Barium: Avoid having a procedure within 7-10 days after having had a radiological study involving the use of radiological contrast. (Myelograms, Barium swallow or enema study). . Heart attacks: Avoid any elective procedures or surgeries for the initial 6 months after a "Myocardial Infarction" (Heart Attack). . Blood thinners: It is imperative that you stop these medications before procedures. Let us know if you if you take any blood thinner.  . Infection: Avoid procedures during or within two weeks of an infection (including chest colds or gastrointestinal problems). Symptoms associated with infections include: Localized redness, fever, chills, night sweats or profuse sweating, burning sensation when voiding, cough, congestion, stuffiness, runny nose, sore throat, diarrhea, nausea, vomiting, cold or Flu symptoms, recent or current infections. It is specially important if the infection is over the area that we intend to treat. Marland Kitchen Heart and lung problems: Symptoms that may suggest an active cardiopulmonary problem include: cough, chest pain, breathing difficulties or shortness of breath, dizziness, ankle swelling, uncontrolled high or unusually low blood pressure, and/or palpitations. If you are experiencing any of these symptoms, cancel your procedure and  contact your primary care physician for an evaluation.  Remember:  Regular Business hours are:  Monday to Thursday 8:00 AM to 4:00 PM  Provider's Schedule: Milinda Pointer, MD:  Procedure days: Tuesday and Thursday 7:30 AM to 4:00 PM  Gillis Santa, MD:  Procedure days: Monday and Wednesday 7:30 AM to 4:00 PM ____________________________________________________________________________________________  ____________________________________________________________________________________________  Preparing for Procedure with Sedation  Instructions: . Oral Intake: Do not eat or drink anything for at least 8 hours prior to your procedure. . Transportation: Public transportation is not allowed. Bring an adult driver. The driver must be physically present in our waiting room before any procedure can be started. Marland Kitchen Physical Assistance: Bring an adult physically capable of assisting you, in the event you need help. This adult should keep you company at home for at least 6 hours after the procedure. . Blood Pressure Medicine: Take your blood pressure medicine with a sip of water the morning of the procedure. . Blood thinners: Notify our staff if you are taking any blood thinners. Depending on which one you take, there will be specific instructions on how and when to stop it. . Diabetics on insulin: Notify the staff so that you can be scheduled 1st case in the morning. If your diabetes requires high dose insulin, take only  of your normal insulin dose the morning of the procedure and notify the staff that you have done so. . Preventing infections: Shower with an antibacterial soap the morning of your procedure. . Build-up your immune system: Take 1000 mg of Vitamin C with every meal (3 times a day) the day prior to your procedure. Marland Kitchen Antibiotics: Inform the staff if you have a condition or reason that requires you to take antibiotics before dental procedures. . Pregnancy: If you are pregnant, call  and cancel the procedure. . Sickness: If you have a cold, fever, or any active infections, call and cancel the procedure. . Arrival: You must be in the facility at least 30 minutes prior to your scheduled procedure. . Children: Do not bring children with you. . Dress appropriately: Bring dark clothing that you would not mind if they get stained. . Valuables: Do not bring any jewelry or valuables.  Procedure appointments are reserved for interventional treatments only. Marland Kitchen No Prescription Refills. . No medication changes will be discussed during procedure appointments. . No disability issues will be discussed.  Reasons to call and reschedule or cancel your procedure: (Following these recommendations will minimize the risk of a serious complication.) . Surgeries: Avoid having procedures within 2 weeks of any surgery. (Avoid for 2 weeks before or after any surgery). . Flu Shots: Avoid having procedures within 2 weeks of a flu shots or . (Avoid for 2 weeks before or after immunizations). . Barium: Avoid having a procedure within 7-10 days after having had a radiological study involving the use of radiological contrast. (Myelograms, Barium swallow or enema study). . Heart attacks: Avoid any elective procedures or surgeries for the initial 6 months after a "Myocardial Infarction" (Heart Attack). . Blood thinners: It is imperative that you stop these medications before procedures. Let us know if you if you take any blood thinner.  . Infection: Avoid procedures during or within two weeks of an infection (including chest colds or gastrointestinal problems). Symptoms associated with infections include: Localized redness, fever, chills, night sweats or profuse sweating, burning sensation when voiding, cough, congestion, stuffiness, runny nose, sore throat, diarrhea, nausea, vomiting, cold or Flu symptoms, recent or current infections. It is  specially important if the infection is over the area that we intend to  treat. Marland Kitchen Heart and lung problems: Symptoms that may suggest an active cardiopulmonary problem include: cough, chest pain, breathing difficulties or shortness of breath, dizziness, ankle swelling, uncontrolled high or unusually low blood pressure, and/or palpitations. If you are experiencing any of these symptoms, cancel your procedure and contact your primary care physician for an evaluation.  Remember:  Regular Business hours are:  Monday to Thursday 8:00 AM to 4:00 PM  Provider's Schedule: Milinda Pointer, MD:  Procedure days: Tuesday and Thursday 7:30 AM to 4:00 PM  Gillis Santa, MD:  Procedure days: Monday and Wednesday 7:30 AM to 4:00 PM ____________________________________________________________________________________________  Radiofrequency Lesioning Radiofrequency lesioning is a procedure that is performed to relieve pain. The procedure is often used for back, neck, or arm pain. Radiofrequency lesioning involves the use of a machine that creates radio waves to make heat. During the procedure, the heat is applied to the nerve that carries the pain signal. The heat damages the nerve and interferes with the pain signal. Pain relief usually starts about 2 weeks after the procedure and lasts for 6 months to 1 year. Tell a health care provider about:  Any allergies you have.  All medicines you are taking, including vitamins, herbs, eye drops, creams, and over-the-counter medicines.  Any problems you or family members have had with anesthetic medicines.  Any blood disorders you have.  Any surgeries you have had.  Any medical conditions you have.  Whether you are pregnant or may be pregnant. What are the risks? Generally, this is a safe procedure. However, problems may occur, including:  Pain or soreness at the injection site.  Infection at the injection site.  Damage to nerves or blood vessels.  What happens before the procedure?  Ask your health care provider  about: ? Changing or stopping your regular medicines. This is especially important if you are taking diabetes medicines or blood thinners. ? Taking medicines such as aspirin and ibuprofen. These medicines can thin your blood. Do not take these medicines before your procedure if your health care provider instructs you not to.  Follow instructions from your health care provider about eating or drinking restrictions.  Plan to have someone take you home after the procedure.  If you go home right after the procedure, plan to have someone with you for 24 hours. What happens during the procedure?  You will be given one or more of the following: ? A medicine to help you relax (sedative). ? A medicine to numb the area (local anesthetic).  You will be awake during the procedure. You will need to be able to talk with the health care provider during the procedure.  With the help of a type of X-ray (fluoroscopy), the health care provider will insert a radiofrequency needle into the area to be treated.  Next, a wire that carries the radio waves (electrode) will be put through the radiofrequency needle. An electrical pulse will be sent through the electrode to verify the correct nerve. You will feel a tingling sensation, and you may have muscle twitching.  Then, the tissue that is around the needle tip will be heated by an electric current that is passed using the radiofrequency machine. This will numb the nerves.  A bandage (dressing) will be put on the insertion area after the procedure is done. The procedure may vary among health care providers and hospitals. What happens after the procedure?  Your blood pressure,  heart rate, breathing rate, and blood oxygen level will be monitored often until the medicines you were given have worn off.  Return to your normal activities as directed by your health care provider. This information is not intended to replace advice given to you by your health care  provider. Make sure you discuss any questions you have with your health care provider. Document Released: 09/17/2010 Document Revised: 06/27/2015 Document Reviewed: 02/26/2014 [employer] Interactive Patient Education  Zaid Schein.

## 2017-12-07 NOTE — Patient Instructions (Addendum)
____________________________________________________________________________________________  Medication Rules  Applies to: All patients receiving prescriptions (written or electronic).  Pharmacy of record: Pharmacy where electronic prescriptions will be sent. If written prescriptions are taken to a different pharmacy, please inform the nursing staff. The pharmacy listed in the electronic medical record should be the one where you would like electronic prescriptions to be sent.  Prescription refills: Only during scheduled appointments. Applies to both, written and electronic prescriptions.  NOTE: The following applies primarily to controlled substances (Opioid* Pain Medications).   Patient's responsibilities: 1. Pain Pills: Bring all pain pills to every appointment (except for procedure appointments). 2. Pill Bottles: Bring pills in original pharmacy bottle. Always bring newest bottle. Bring bottle, even if empty. 3. Medication refills: You are responsible for knowing and keeping track of what medications you need refilled. The day before your appointment, write a list of all prescriptions that need to be refilled. Bring that list to your appointment and give it to the admitting nurse. Prescriptions will be written only during appointments. If you forget a medication, it will not be "Called in", "Faxed", or "electronically sent". You will need to get another appointment to get these prescribed. 4. Prescription Accuracy: You are responsible for carefully inspecting your prescriptions before leaving our office. Have the discharge nurse carefully go over each prescription with you, before taking them home. Make sure that your name is accurately spelled, that your address is correct. Check the name and dose of your medication to make sure it is accurate. Check the number of pills, and the written instructions to make sure they are clear and accurate. Make sure that you are given enough medication to last  until your next medication refill appointment. 5. Taking Medication: Take medication as prescribed. Never take more pills than instructed. Never take medication more frequently than prescribed. Taking less pills or less frequently is permitted and encouraged, when it comes to controlled substances (written prescriptions).  6. Inform other Doctors: Always inform, all of your healthcare providers, of all the medications you take. 7. Pain Medication from other Providers: You are not allowed to accept any additional pain medication from any other Doctor or Healthcare provider. There are two exceptions to this rule. (see below) In the event that you require additional pain medication, you are responsible for notifying us, as stated below. 8. Medication Agreement: You are responsible for carefully reading and following our Medication Agreement. This must be signed before receiving any prescriptions from our practice. Safely store a copy of your signed Agreement. Violations to the Agreement will result in no further prescriptions. (Additional copies of our Medication Agreement are available upon request.) 9. Laws, Rules, & Regulations: All patients are expected to follow all Federal and State Laws, Statutes, Rules, & Regulations. Ignorance of the Laws does not constitute a valid excuse. The use of any illegal substances is prohibited. 10. Adopted CDC guidelines & recommendations: Target dosing levels will be at or below 60 MME/day. Use of benzodiazepines** is not recommended.  Exceptions: There are only two exceptions to the rule of not receiving pain medications from other Healthcare Providers. 1. Exception #1 (Emergencies): In the event of an emergency (i.e.: accident requiring emergency care), you are allowed to receive additional pain medication. However, you are responsible for: As soon as you are able, call our office [phone], at any time of the day or night, and leave a message stating your name, the  date and nature of the emergency, and the name and dose of the medication   prescribed. In the event that your call is answered by a member of our staff, make sure to document and save the date, time, and the name of the person that took your information.  2. Exception #2 (Planned Surgery): In the event that you are scheduled by another doctor or dentist to have any type of surgery or procedure, you are allowed (for a period no longer than 30 days), to receive additional pain medication, for the acute post-op pain. However, in this case, you are responsible for picking up a copy of our "Post-op Pain Management for Surgeons" handout, and giving it to your surgeon or dentist. This document is available at our office, and does not require an appointment to obtain it. Simply go to our office during business hours (Monday-Thursday from 8:00 AM to 4:00 PM) (Friday 8:00 AM to 12:00 Noon) or if you have a scheduled appointment with [place], prior to your surgery, and ask for it by name. In addition, you will need to provide [place] with your name, name of your surgeon, type of surgery, and date of procedure or surgery.  *Opioid medications include: morphine, codeine, oxycodone, oxymorphone, hydrocodone, hydromorphone, meperidine, tramadol, tapentadol, buprenorphine, fentanyl, methadone. **Benzodiazepine medications include: diazepam (Valium), alprazolam (Xanax), clonazepam (Klonopine), lorazepam (Ativan), clorazepate (Tranxene), chlordiazepoxide (Librium), estazolam (Prosom), oxazepam (Serax), temazepam (Restoril), triazolam (Halcion) (Last updated: 04/01/2017) ____________________________________________________________________________________________    BMI interpretation table: BMI level Category Range association with higher incidence of chronic pain  <18 kg/m2 Underweight   18.5-24.9 kg/m2 Ideal body weight   25-29.9 kg/m2 Overweight Increased incidence by 20%  30-34.9 kg/m2 Obese (Class I) Increased incidence by  68%  35-39.9 kg/m2 Severe obesity (Class II) Increased incidence by 136%  >40 kg/m2 Extreme obesity (Class III) Increased incidence by 254%   Patient's current BMI Ideal Body weight  Body mass index is 32.98 kg/m. Ideal body weight: 79.9 kg (176 lb 2.4 oz) Adjusted ideal body weight: 93.3 kg (205 lb 11 oz)   BMI Readings from Last 4 Encounters:  12/07/17 32.98 kg/m  09/27/17 30.62 kg/m  09/15/17 32.98 kg/m  05/18/17 32.98 kg/m   Wt Readings from Last 4 Encounters:  12/07/17 250 lb (113.4 kg)  09/27/17 258 lb 3.2 oz (117.1 kg)  09/15/17 250 lb (113.4 kg)  05/18/17 250 lb (113.4 kg)  ____________________________________________________________________________________________  Preparing for Procedure with Sedation  Instructions: . Oral Intake: Do not eat or drink anything for at least 8 hours prior to your procedure. . Transportation: Public transportation is not allowed. Bring an adult driver. The driver must be physically present in our waiting room before any procedure can be started. Marland Kitchen Physical Assistance: Bring an adult physically capable of assisting you, in the event you need help. This adult should keep you company at home for at least 6 hours after the procedure. . Blood Pressure Medicine: Take your blood pressure medicine with a sip of water the morning of the procedure. . Blood thinners: Notify our staff if you are taking any blood thinners. Depending on which one you take, there will be specific instructions on how and when to stop it. . Diabetics on insulin: Notify the staff so that you can be scheduled 1st case in the morning. If your diabetes requires high dose insulin, take only  of your normal insulin dose the morning of the procedure and notify the staff that you have done so. . Preventing infections: Shower with an antibacterial soap the morning of your procedure. . Build-up your immune system: Take 1000 mg  of Vitamin C with every meal (3 times a day) the day  prior to your procedure. Marland Kitchen Antibiotics: Inform the staff if you have a condition or reason that requires you to take antibiotics before dental procedures. . Pregnancy: If you are pregnant, call and cancel the procedure. . Sickness: If you have a cold, fever, or any active infections, call and cancel the procedure. . Arrival: You must be in the facility at least 30 minutes prior to your scheduled procedure. . Children: Do not bring children with you. . Dress appropriately: Bring dark clothing that you would not mind if they get stained. . Valuables: Do not bring any jewelry or valuables.  Procedure appointments are reserved for interventional treatments only. Marland Kitchen No Prescription Refills. . No medication changes will be discussed during procedure appointments. . No disability issues will be discussed.  Reasons to call and reschedule or cancel your procedure: (Following these recommendations will minimize the risk of a serious complication.) . Surgeries: Avoid having procedures within 2 weeks of any surgery. (Avoid for 2 weeks before or after any surgery). . Flu Shots: Avoid having procedures within 2 weeks of a flu shots or . (Avoid for 2 weeks before or after immunizations). . Barium: Avoid having a procedure within 7-10 days after having had a radiological study involving the use of radiological contrast. (Myelograms, Barium swallow or enema study). . Heart attacks: Avoid any elective procedures or surgeries for the initial 6 months after a "Myocardial Infarction" (Heart Attack). . Blood thinners: It is imperative that you stop these medications before procedures. Let us know if you if you take any blood thinner.  . Infection: Avoid procedures during or within two weeks of an infection (including chest colds or gastrointestinal problems). Symptoms associated with infections include: Localized redness, fever, chills, night sweats or profuse sweating, burning sensation when voiding, cough,  congestion, stuffiness, runny nose, sore throat, diarrhea, nausea, vomiting, cold or Flu symptoms, recent or current infections. It is specially important if the infection is over the area that we intend to treat. Marland Kitchen Heart and lung problems: Symptoms that may suggest an active cardiopulmonary problem include: cough, chest pain, breathing difficulties or shortness of breath, dizziness, ankle swelling, uncontrolled high or unusually low blood pressure, and/or palpitations. If you are experiencing any of these symptoms, cancel your procedure and contact your primary care physician for an evaluation.  Remember:  Regular Business hours are:  Monday to Thursday 8:00 AM to 4:00 PM  Provider's Schedule: Milinda Pointer, MD:  Procedure days: Tuesday and Thursday 7:30 AM to 4:00 PM  Gillis Santa, MD:  Procedure days: Monday and Wednesday 7:30 AM to 4:00 PM ____________________________________________________________________________________________  ____________________________________________________________________________________________  Preparing for Procedure with Sedation  Instructions: . Oral Intake: Do not eat or drink anything for at least 8 hours prior to your procedure. . Transportation: Public transportation is not allowed. Bring an adult driver. The driver must be physically present in our waiting room before any procedure can be started. Marland Kitchen Physical Assistance: Bring an adult physically capable of assisting you, in the event you need help. This adult should keep you company at home for at least 6 hours after the procedure. . Blood Pressure Medicine: Take your blood pressure medicine with a sip of water the morning of the procedure. . Blood thinners: Notify our staff if you are taking any blood thinners. Depending on which one you take, there will be specific instructions on how and when to stop it. . Diabetics on insulin: Notify the  staff so that you can be scheduled 1st case in the  morning. If your diabetes requires high dose insulin, take only  of your normal insulin dose the morning of the procedure and notify the staff that you have done so. . Preventing infections: Shower with an antibacterial soap the morning of your procedure. . Build-up your immune system: Take 1000 mg of Vitamin C with every meal (3 times a day) the day prior to your procedure. Marland Kitchen Antibiotics: Inform the staff if you have a condition or reason that requires you to take antibiotics before dental procedures. . Pregnancy: If you are pregnant, call and cancel the procedure. . Sickness: If you have a cold, fever, or any active infections, call and cancel the procedure. . Arrival: You must be in the facility at least 30 minutes prior to your scheduled procedure. . Children: Do not bring children with you. . Dress appropriately: Bring dark clothing that you would not mind if they get stained. . Valuables: Do not bring any jewelry or valuables.  Procedure appointments are reserved for interventional treatments only. Marland Kitchen No Prescription Refills. . No medication changes will be discussed during procedure appointments. . No disability issues will be discussed.  Reasons to call and reschedule or cancel your procedure: (Following these recommendations will minimize the risk of a serious complication.) . Surgeries: Avoid having procedures within 2 weeks of any surgery. (Avoid for 2 weeks before or after any surgery). . Flu Shots: Avoid having procedures within 2 weeks of a flu shots or . (Avoid for 2 weeks before or after immunizations). . Barium: Avoid having a procedure within 7-10 days after having had a radiological study involving the use of radiological contrast. (Myelograms, Barium swallow or enema study). . Heart attacks: Avoid any elective procedures or surgeries for the initial 6 months after a "Myocardial Infarction" (Heart Attack). . Blood thinners: It is imperative that you stop these medications  before procedures. Let us know if you if you take any blood thinner.  . Infection: Avoid procedures during or within two weeks of an infection (including chest colds or gastrointestinal problems). Symptoms associated with infections include: Localized redness, fever, chills, night sweats or profuse sweating, burning sensation when voiding, cough, congestion, stuffiness, runny nose, sore throat, diarrhea, nausea, vomiting, cold or Flu symptoms, recent or current infections. It is specially important if the infection is over the area that we intend to treat. Marland Kitchen Heart and lung problems: Symptoms that may suggest an active cardiopulmonary problem include: cough, chest pain, breathing difficulties or shortness of breath, dizziness, ankle swelling, uncontrolled high or unusually low blood pressure, and/or palpitations. If you are experiencing any of these symptoms, cancel your procedure and contact your primary care physician for an evaluation.  Remember:  Regular Business hours are:  Monday to Thursday 8:00 AM to 4:00 PM  Provider's Schedule: Milinda Pointer, MD:  Procedure days: Tuesday and Thursday 7:30 AM to 4:00 PM  Gillis Santa, MD:  Procedure days: Monday and Wednesday 7:30 AM to 4:00 PM ____________________________________________________________________________________________  Radiofrequency Lesioning Radiofrequency lesioning is a procedure that is performed to relieve pain. The procedure is often used for back, neck, or arm pain. Radiofrequency lesioning involves the use of a machine that creates radio waves to make heat. During the procedure, the heat is applied to the nerve that carries the pain signal. The heat damages the nerve and interferes with the pain signal. Pain relief usually starts about 2 weeks after the procedure and lasts for 6 months to  1 year. Tell a health care provider about:  Any allergies you have.  All medicines you are taking, including vitamins, herbs, eye drops,  creams, and over-the-counter medicines.  Any problems you or family members have had with anesthetic medicines.  Any blood disorders you have.  Any surgeries you have had.  Any medical conditions you have.  Whether you are pregnant or may be pregnant. What are the risks? Generally, this is a safe procedure. However, problems may occur, including:  Pain or soreness at the injection site.  Infection at the injection site.  Damage to nerves or blood vessels.  What happens before the procedure?  Ask your health care provider about: ? Changing or stopping your regular medicines. This is especially important if you are taking diabetes medicines or blood thinners. ? Taking medicines such as aspirin and ibuprofen. These medicines can thin your blood. Do not take these medicines before your procedure if your health care provider instructs you not to.  Follow instructions from your health care provider about eating or drinking restrictions.  Plan to have someone take you home after the procedure.  If you go home right after the procedure, plan to have someone with you for 24 hours. What happens during the procedure?  You will be given one or more of the following: ? A medicine to help you relax (sedative). ? A medicine to numb the area (local anesthetic).  You will be awake during the procedure. You will need to be able to talk with the health care provider during the procedure.  With the help of a type of X-ray (fluoroscopy), the health care provider will insert a radiofrequency needle into the area to be treated.  Next, a wire that carries the radio waves (electrode) will be put through the radiofrequency needle. An electrical pulse will be sent through the electrode to verify the correct nerve. You will feel a tingling sensation, and you may have muscle twitching.  Then, the tissue that is around the needle tip will be heated by an electric current that is passed using the  radiofrequency machine. This will numb the nerves.  A bandage (dressing) will be put on the insertion area after the procedure is done. The procedure may vary among health care providers and hospitals. What happens after the procedure?  Your blood pressure, heart rate, breathing rate, and blood oxygen level will be monitored often until the medicines you were given have worn off.  Return to your normal activities as directed by your health care provider. This information is not intended to replace advice given to you by your health care provider. Make sure you discuss any questions you have with your health care provider. Document Released: 09/17/2010 Document Revised: 06/27/2015 Document Reviewed: 02/26/2014 [employer] Interactive Patient Education  Chaise Schein.

## 2017-12-22 ENCOUNTER — Other Ambulatory Visit: Payer: Self-pay | Admitting: Family Medicine

## 2017-12-22 DIAGNOSIS — B351 Tinea unguium: Secondary | ICD-10-CM | POA: Diagnosis not present

## 2017-12-22 DIAGNOSIS — E119 Type 2 diabetes mellitus without complications: Secondary | ICD-10-CM | POA: Diagnosis not present

## 2017-12-22 DIAGNOSIS — L851 Acquired keratosis [keratoderma] palmaris et plantaris: Secondary | ICD-10-CM | POA: Diagnosis not present

## 2017-12-25 ENCOUNTER — Other Ambulatory Visit: Payer: Self-pay | Admitting: Family Medicine

## 2017-12-25 DIAGNOSIS — E1142 Type 2 diabetes mellitus with diabetic polyneuropathy: Secondary | ICD-10-CM

## 2017-12-25 DIAGNOSIS — Z794 Long term (current) use of insulin: Principal | ICD-10-CM

## 2017-12-28 ENCOUNTER — Other Ambulatory Visit: Payer: Self-pay | Admitting: Family Medicine

## 2017-12-29 MED ORDER — ALPRAZOLAM 1 MG PO TABS: 0.5000 mg | ORAL_TABLET | Freq: Two times a day (BID) | ORAL | 1 refills | Status: DC | PRN

## 2017-12-29 NOTE — Telephone Encounter (Signed)
Controlled substance database reviewed. Sent to pharmacy. Alternative mg pill sent to pharmacy with same dose.

## 2018-01-05 ENCOUNTER — Ambulatory Visit (HOSPITAL_BASED_OUTPATIENT_CLINIC_OR_DEPARTMENT_OTHER): Payer: Medicare Other | Admitting: Student in an Organized Health Care Education/Training Program

## 2018-01-05 ENCOUNTER — Other Ambulatory Visit: Payer: Self-pay

## 2018-01-05 ENCOUNTER — Encounter: Payer: Self-pay | Admitting: Student in an Organized Health Care Education/Training Program

## 2018-01-05 ENCOUNTER — Ambulatory Visit
Admission: RE | Admit: 2018-01-05 | Discharge: 2018-01-05 | Disposition: A | Discharge status: Home / Self Care | Payer: Medicare Other | Source: Ambulatory Visit | Attending: Student in an Organized Health Care Education/Training Program | Admitting: Student in an Organized Health Care Education/Training Program

## 2018-01-05 DIAGNOSIS — Z79899 Other long term (current) drug therapy: Secondary | ICD-10-CM | POA: Diagnosis not present

## 2018-01-05 DIAGNOSIS — M47816 Spondylosis without myelopathy or radiculopathy, lumbar region: Secondary | ICD-10-CM

## 2018-01-05 DIAGNOSIS — Z794 Long term (current) use of insulin: Secondary | ICD-10-CM | POA: Insufficient documentation

## 2018-01-05 DIAGNOSIS — M545 Low back pain: Secondary | ICD-10-CM | POA: Diagnosis present

## 2018-01-05 MED ORDER — ROPIVACAINE HCL 2 MG/ML IJ SOLN
10.0000 mL | Freq: Once | INTRAMUSCULAR | Status: AC
  Administered 2018-01-05: 10 mL
  Filled 2018-01-05: qty 10

## 2018-01-05 MED ORDER — DEXAMETHASONE SODIUM PHOSPHATE 10 MG/ML IJ SOLN
10.0000 mg | Freq: Once | INTRAMUSCULAR | Status: AC
  Administered 2018-01-05: 10 mg
  Filled 2018-01-05: qty 1

## 2018-01-05 MED ORDER — GLYCOPYRROLATE 0.2 MG/ML IJ SOLN
INTRAMUSCULAR | Status: AC
  Filled 2018-01-05: qty 1

## 2018-01-05 MED ORDER — ROPIVACAINE HCL 2 MG/ML IJ SOLN
1.0000 mL | Freq: Once | INTRAMUSCULAR | Status: AC
  Administered 2018-01-05: 10 mL via EPIDURAL
  Filled 2018-01-05: qty 10

## 2018-01-05 MED ORDER — LACTATED RINGERS IV SOLN
1000.0000 mL | Freq: Once | INTRAVENOUS | Status: AC
  Administered 2018-01-05: 1000 mL via INTRAVENOUS

## 2018-01-05 MED ORDER — FENTANYL CITRATE (PF) 100 MCG/2ML IJ SOLN
25.0000 ug | INTRAMUSCULAR | Status: DC | PRN
  Administered 2018-01-05: 100 ug via INTRAVENOUS
  Filled 2018-01-05: qty 2

## 2018-01-05 MED ORDER — LIDOCAINE HCL 2 % IJ SOLN
20.0000 mL | Freq: Once | INTRAMUSCULAR | Status: AC
  Administered 2018-01-05: 400 mg
  Filled 2018-01-05: qty 20

## 2018-01-05 NOTE — Progress Notes (Signed)
Patient's Name: Alan Schmidt  MRN: [id number]  Referring Provider: Vevelyn Francois, NP  DOB: [birth year]  PCP: [name], MD  DOS: 01/05/2018  Note by: Gillis Santa, MD  Service setting: Ambulatory outpatient  Specialty: Interventional Pain Management  Patient type: Established  Location: [facility] (AMB) Pain Management Facility  Visit type: Interventional Procedure   Primary Reason for Visit: Interventional Pain Management Treatment. CC: Back Pain (low and mid)  Procedure:          Anesthesia, Analgesia, Anxiolysis:  Type: Thermal Lumbar Facet, Medial Branch Radiofrequency Ablation/Neurotomy  #2  Primary Purpose: Therapeutic Region: Posterolateral Lumbosacral Spine Level: L2, L3, L4, L5, & S1 Medial Branch Level(s). These levels will denervate the L3-4, L4-5, and the L5-S1 lumbar facet joints. Laterality: Right  Type: Moderate (Conscious) Sedation combined with Local Anesthesia Indication(s): Analgesia and Anxiety Route: Intravenous (IV) IV Access: Secured Sedation: Meaningful verbal contact was maintained at all times during the procedure  Local Anesthetic: Lidocaine 1-2%  Position: Prone   Indications: 1. Lumbar spondylosis    Alan Schmidt has been dealing with the above chronic pain for longer than three months and has either failed to respond, was unable to tolerate, or simply did not get enough benefit from other more conservative therapies including, but not limited to: 1. Over-the-counter medications 2. Anti-inflammatory medications 3. Muscle relaxants 4. Membrane stabilizers 5. Opioids 6. Physical therapy and/or chiropractic manipulation 7. Modalities (Heat, ice, etc.) 8. Invasive techniques such as nerve blocks. Alan Schmidt has attained more than 50% relief of the pain from a series of diagnostic injections conducted in separate occasions.  Pain Score: Pre-procedure: 3 /10 Post-procedure: 0-No pain/10  Pre-op Assessment:  Alan Schmidt is a 72 y.o. (year old),  male patient, seen today for interventional treatment. He  has a past surgical history that includes Total hip arthroplasty; Tonsillectomy; and right hip surgery. Alan Schmidt has a current medication list which includes the following prescription(s): accu-chek fastclix lancets, accu-chek fastclix lancets, acetaminophen, alprazolam, amlodipine, bd pen needle nano u/f, blood glucose meter kit and supplies, carvedilol, cyclobenzaprine, desoximetasone, empagliflozin, escitalopram, glucose blood, hydrochlorothiazide, hydroxyzine, insulin lispro, jardiance, loratadine, magnesium, metformin, mometasone, naloxone, olmesartan, oxycodone hcl, oxycodone hcl, oxycodone hcl, pantoprazole, pravastatin, tamsulosin, tresiba flextouch, victoza, and aspirin ec, and the following Facility-Administered Medications: fentanyl. His primarily concern today is the Back Pain (low and mid)  Initial Vital Signs:  Pulse/HCG Rate: 89ECG Heart Rate: 91 Temp: 98.5 F (36.9 C) Resp: 15 BP: 133/83 SpO2: 97 %  BMI: Estimated body mass index is 33.64 kg/m as calculated from the following:   Height as of this encounter: 6' 1" (1.854 m).   Weight as of this encounter: 255 lb (115.7 kg).  Risk Assessment: Allergies: Reviewed. He is allergic to contrast media [iodinated diagnostic agents]; iodine; and shellfish allergy.  Allergy Precautions: None required Coagulopathies: Reviewed. None identified.  Blood-thinner therapy: None at this time Active Infection(s): Reviewed. None identified. Alan Schmidt is afebrile  Site Confirmation: Alan Schmidt was asked to confirm the procedure and laterality before marking the site Procedure checklist: Completed Consent: Before the procedure and under the influence of no sedative(s), amnesic(s), or anxiolytics, the patient was informed of the treatment options, risks and possible complications. To fulfill our ethical and legal obligations, as recommended by the American Medical Association's Code  of Ethics, I have informed the patient of my clinical impression; the nature and purpose of the treatment or procedure; the risks, benefits, and possible complications of the intervention; the alternatives, including  doing nothing; the risk(s) and benefit(s) of the alternative treatment(s) or procedure(s); and the risk(s) and benefit(s) of doing nothing. The patient was provided information about the general risks and possible complications associated with the procedure. These may include, but are not limited to: failure to achieve desired goals, infection, bleeding, organ or nerve damage, allergic reactions, paralysis, and death. In addition, the patient was informed of those risks and complications associated to Spine-related procedures, such as failure to decrease pain; infection (i.e.: Meningitis, epidural or intraspinal abscess); bleeding (i.e.: epidural hematoma, subarachnoid hemorrhage, or any other type of intraspinal or peri-dural bleeding); organ or nerve damage (i.e.: Any type of peripheral nerve, nerve root, or spinal cord injury) with subsequent damage to sensory, motor, and/or autonomic systems, resulting in permanent pain, numbness, and/or weakness of one or several areas of the body; allergic reactions; (i.e.: anaphylactic reaction); and/or death. Furthermore, the patient was informed of those risks and complications associated with the medications. These include, but are not limited to: allergic reactions (i.e.: anaphylactic or anaphylactoid reaction(s)); adrenal axis suppression; blood sugar elevation that in diabetics may result in ketoacidosis or comma; water retention that in patients with history of congestive heart failure may result in shortness of breath, pulmonary edema, and decompensation with resultant heart failure; weight gain; swelling or edema; medication-induced neural toxicity; particulate matter embolism and blood vessel occlusion with resultant organ, and/or nervous system  infarction; and/or aseptic necrosis of one or more joints. Finally, the patient was informed that Medicine is not an exact science; therefore, there is also the possibility of unforeseen or unpredictable risks and/or possible complications that may result in a catastrophic outcome. The patient indicated having understood very clearly. We have given the patient no guarantees and we have made no promises. Enough time was given to the patient to ask questions, all of which were answered to the patient's satisfaction. Mr. Macho has indicated that he wanted to continue with the procedure. Attestation: I, the ordering provider, attest that I have discussed with the patient the benefits, risks, side-effects, alternatives, likelihood of achieving goals, and potential problems during recovery for the procedure that I have provided informed consent. Date  Time: 01/05/2018  7:59 AM  Pre-Procedure Preparation:  Monitoring: As per clinic protocol. Respiration, ETCO2, SpO2, BP, heart rate and rhythm monitor placed and checked for adequate function Safety Precautions: Patient was assessed for positional comfort and pressure points before starting the procedure. Time-out: I initiated and conducted the "Time-out" before starting the procedure, as per protocol. The patient was asked to participate by confirming the accuracy of the "Time Out" information. Verification of the correct person, site, and procedure were performed and confirmed by me, the nursing staff, and the patient. "Time-out" conducted as per Joint Commission's Universal Protocol (UP.01.01.01). Time: [id number]  Description of Procedure:          Laterality: Right Levels:  L2, L3, L4, L5, & S1 Medial Branch Level(s), at the L3-4, L4-5, and the L5-S1 lumbar facet joints. Area Prepped: Lumbosacral Prepping solution: ChloraPrep (2% chlorhexidine gluconate and 70% isopropyl alcohol) Safety Precautions: Aspiration looking for blood return was conducted prior to  all injections. At no point did we inject any substances, as a needle was being advanced. Before injecting, the patient was told to immediately notify me if he was experiencing any new onset of "ringing in the ears, or metallic taste in the mouth". No attempts were made at seeking any paresthesias. Safe injection practices and needle disposal techniques used. Medications properly checked for  expiration dates. SDV (single dose vial) medications used. After the completion of the procedure, all disposable equipment used was discarded in the proper designated medical waste containers. Local Anesthesia: Protocol guidelines were followed. The patient was positioned over the fluoroscopy table. The area was prepped in the usual manner. The time-out was completed. The target area was identified using fluoroscopy. A 12-in long, straight, sterile hemostat was used with fluoroscopic guidance to locate the targets for each level blocked. Once located, the skin was marked with an approved surgical skin marker. Once all sites were marked, the skin (epidermis, dermis, and hypodermis), as well as deeper tissues (fat, connective tissue and muscle) were infiltrated with a small amount of a short-acting local anesthetic, loaded on a 10cc syringe with a 25G, 1.5-in  Needle. An appropriate amount of time was allowed for local anesthetics to take effect before proceeding to the next step. Local Anesthetic: Lidocaine 2.0% The unused portion of the local anesthetic was discarded in the proper designated containers. Technical explanation of process:  Radiofrequency Ablation (RFA) L2 Medial Branch Nerve RFA: The target area for the L2 medial branch is at the junction of the postero-lateral aspect of the superior articular process and the superior, posterior, and medial edge of the transverse process of L3. Under fluoroscopic guidance, a Radiofrequency needle was inserted until contact was made with os over the superior postero-lateral  aspect of the pedicular shadow (target area). Sensory and motor testing was conducted to properly adjust the position of the needle. Once satisfactory placement of the needle was achieved, the numbing solution was slowly injected after negative aspiration for blood. 2.0 mL of the nerve block solution was injected without difficulty or complication. After waiting for at least 3 minutes, the ablation was performed. Once completed, the needle was removed intact. L3 Medial Branch Nerve RFA: The target area for the L3 medial branch is at the junction of the postero-lateral aspect of the superior articular process and the superior, posterior, and medial edge of the transverse process of L4. Under fluoroscopic guidance, a Radiofrequency needle was inserted until contact was made with os over the superior postero-lateral aspect of the pedicular shadow (target area). Sensory and motor testing was conducted to properly adjust the position of the needle. Once satisfactory placement of the needle was achieved, the numbing solution was slowly injected after negative aspiration for blood. 2.0 mL of the nerve block solution was injected without difficulty or complication. After waiting for at least 3 minutes, the ablation was performed. Once completed, the needle was removed intact. L4 Medial Branch Nerve RFA: The target area for the L4 medial branch is at the junction of the postero-lateral aspect of the superior articular process and the superior, posterior, and medial edge of the transverse process of L5. Under fluoroscopic guidance, a Radiofrequency needle was inserted until contact was made with os over the superior postero-lateral aspect of the pedicular shadow (target area). Sensory and motor testing was conducted to properly adjust the position of the needle. Once satisfactory placement of the needle was achieved, the numbing solution was slowly injected after negative aspiration for blood. 2.0 mL of the nerve block  solution was injected without difficulty or complication. After waiting for at least 3 minutes, the ablation was performed. Once completed, the needle was removed intact. L5 Medial Branch Nerve RFA: The target area for the L5 medial branch is at the junction of the postero-lateral aspect of the superior articular process of S1 and the superior, posterior, and medial edge  of the sacral ala. Under fluoroscopic guidance, a Radiofrequency needle was inserted until contact was made with os over the superior postero-lateral aspect of the pedicular shadow (target area). Sensory and motor testing was conducted to properly adjust the position of the needle. Once satisfactory placement of the needle was achieved, the numbing solution was slowly injected after negative aspiration for blood. 2.0 mL of the nerve block solution was injected without difficulty or complication. After waiting for at least 3 minutes, the ablation was performed. Once completed, the needle was removed intact. S1 Medial Branch Nerve RFA: The target area for the S1 medial branch is located inferior to the junction of the S1 superior articular process and the L5 inferior articular process, posterior, inferior, and lateral to the 6 o'clock position of the L5-S1 facet joint, just superior to the S1 posterior foramen. Under fluoroscopic guidance, the Radiofrequency needle was advanced until contact was made with os over the Target area. Sensory and motor testing was conducted to properly adjust the position of the needle. Once satisfactory placement of the needle was achieved, the numbing solution was slowly injected after negative aspiration for blood. 2.0 mL of the nerve block solution was injected without difficulty or complication. After waiting for at least 3 minutes, the ablation was performed. Once completed, the needle was removed intact. Radiofrequency lesioning (ablation):  Radiofrequency Generator: NeuroTherm [id number] Sensory Stimulation  Parameters: 50 Hz was used to locate & identify the nerve, making sure that the needle was positioned such that there was no sensory stimulation below 0.3 V or above 0.7 V. Motor Stimulation Parameters: 2 Hz was used to evaluate the motor component. Care was taken not to lesion any nerves that demonstrated motor stimulation of the lower extremities at an output of less than 2.5 times that of the sensory threshold, or a maximum of 2.0 V. Lesioning Technique Parameters: Standard Radiofrequency settings. (Not bipolar or pulsed.) Temperature Settings: 80 degrees C Lesioning time: 60 seconds Intra-operative Compliance: Compliant Materials & Medications: Needle(s) (Electrode/Cannula) Type: Teflon-coated, curved tip, Radiofrequency needle(s) Gauge: 22G Length: 15cm Numbing solution: 15 cc solution made of 12.5 cc of 0.2% ropivacaine, 1.5 cc of Decadron 10 mg/cc.  2 cc injected at each level above prior to lesioning.  The unused portion of the solution was discarded in the proper designated containers.  Once the entire procedure was completed, the treated area was cleaned, making sure to leave some of the prepping solution back to take advantage of its long term bactericidal properties.  Illustration of the posterior view of the lumbar spine and the posterior neural structures. Laminae of L2 through S1 are labeled. DPRL5, dorsal primary ramus of L5; DPRS1, dorsal primary ramus of S1; DPR3, dorsal primary ramus of L3; FJ, facet (zygapophyseal) joint L3-L4; I, inferior articular process of L4; LB1, lateral branch of dorsal primary ramus of L1; IAB, inferior articular branches from L3 medial branch (supplies L4-L5 facet joint); IBP, intermediate branch plexus; MB3, medial branch of dorsal primary ramus of L3; NR3, third lumbar nerve root; S, superior articular process of L5; SAB, superior articular branches from L4 (supplies L4-5 facet joint also); TP3, transverse process of L3.  Vitals:   01/05/18 0922  01/05/18 0932 01/05/18 0941 01/05/18 0953  BP: 129/78 (!) 117/97 110/76 127/86  Pulse:      Resp: _0 Temp:      TempSrc:      SpO2: 91% 92% 95% 95%  Weight:      Height:  Start Time: 0842 hrs. End Time: 0922 hrs.  Imaging Guidance (Spinal):          Type of Imaging Technique: Fluoroscopy Guidance (Spinal) Indication(s): Assistance in needle guidance and placement for procedures requiring needle placement in or near specific anatomical locations not easily accessible without such assistance. Exposure Time: Please see nurses notes. Contrast: None used. Fluoroscopic Guidance: I was personally present during the use of fluoroscopy. "Tunnel Vision Technique" used to obtain the best possible view of the target area. Parallax error corrected before commencing the procedure. "Direction-depth-direction" technique used to introduce the needle under continuous pulsed fluoroscopy. Once target was reached, antero-posterior, oblique, and lateral fluoroscopic projection used confirm needle placement in all planes. Images permanently stored in EMR. Interpretation: No contrast injected. I personally interpreted the imaging intraoperatively. Adequate needle placement confirmed in multiple planes. Permanent images saved into the patient's record.  Antibiotic Prophylaxis:   Anti-infectives (From admission, onward)   None     Indication(s): None identified  Post-operative Assessment:  Post-procedure Vital Signs:  Pulse/HCG Rate: 8984 Temp: 98.5 F (36.9 C) Resp: 18 BP: 127/86 SpO2: 95 %  EBL: None  Complications: No immediate post-treatment complications observed by team, or reported by patient.  Note: The patient tolerated the entire procedure well. A repeat set of vitals were taken after the procedure and the patient was kept under observation following institutional policy, for this type of procedure. Post-procedural neurological assessment was performed, showing return to  baseline, prior to discharge. The patient was provided with post-procedure discharge instructions, including a section on how to identify potential problems. Should any problems arise concerning this procedure, the patient was given instructions to immediately contact us, at any time, without hesitation. In any case, we plan to contact the patient by telephone for a follow-up status report regarding this interventional procedure.  Comments:  No additional relevant information. 5 out of 5 strength bilateral lower extremity: Plantar flexion, dorsiflexion, knee flexion, knee extension.  Plan of Care   Imaging Orders     DG C-Arm 1-60 Min-No Report Procedure Orders    No procedure(s) ordered today    Medications ordered for procedure: Meds ordered this encounter  Medications  . lactated ringers infusion 1,000 mL  . fentaNYL (SUBLIMAZE) injection 25-100 mcg    Make sure Narcan is available in the pyxis when using this medication. In the event of respiratory depression (RR< 8/min): Titrate NARCAN (naloxone) in increments of 0.1 to 0.2 mg IV at 2-3 minute intervals, until desired degree of reversal.  . ropivacaine (PF) 2 mg/mL (0.2%) (NAROPIN) injection 10 mL  . lidocaine (XYLOCAINE) 2 % (with pres) injection 400 mg  . ropivacaine (PF) 2 mg/mL (0.2%) (NAROPIN) injection 1 mL  . dexamethasone (DECADRON) injection 10 mg  . dexamethasone (DECADRON) injection 10 mg   Medications administered: We administered lactated ringers, fentaNYL, ropivacaine (PF) 2 mg/mL (0.2%), lidocaine, ropivacaine (PF) 2 mg/mL (0.2%), dexamethasone, and dexamethasone.  See the medical record for exact dosing, route, and time of administration.  Disposition: Discharge home  Discharge Date & Time: 01/05/2018; 0953 hrs.   Physician-requested Follow-up: Keep follow-up with Crystal.  If patient needs contralateral side done and if Dr. Dossie Arbour is booked out, I am happy to do.  Future Appointments  Date Time Provider  [place]  01/17/2018  1:15 PM [name], MD [facility]  03/02/2018 12:45 PM Vevelyn Francois, NP [facility] None   Primary Care Physician: [name], MD Location: [facility] Note  by: Gillis Santa, MD Date: 01/05/2018; Time: 11:16 AM  Disclaimer:  Medicine is not an exact science. The only guarantee in medicine is that nothing is guaranteed. It is important to note that the decision to proceed with this intervention was based on the information collected from the patient. The Data and conclusions were drawn from the patient's questionnaire, the interview, and the physical examination. Because the information was provided in large part by the patient, it cannot be guaranteed that it has not been purposely or unconsciously manipulated. Every effort has been made to obtain as much relevant data as possible for this evaluation. It is important to note that the conclusions that lead to this procedure are derived in large part from the available data. Always take into account that the treatment will also be dependent on availability of resources and existing treatment guidelines, considered by other Pain Management Practitioners as being common knowledge and practice, at the time of the intervention. For Medico-Legal purposes, it is also important to point out that variation in procedural techniques and pharmacological choices are the acceptable norm. The indications, contraindications, technique, and results of the above procedure should only be interpreted and judged by a Board-Certified Interventional Pain Specialist with extensive familiarity and expertise in the same exact procedure and technique.

## 2018-01-05 NOTE — Patient Instructions (Signed)

## 2018-01-05 NOTE — Progress Notes (Signed)
Safety precautions to be maintained throughout the outpatient stay will include: orient to surroundings, keep bed in low position, maintain call bell within reach at all times, provide assistance with transfer out of bed and ambulation.  

## 2018-01-06 ENCOUNTER — Telehealth: Payer: Self-pay | Admitting: Nurse Practitioner

## 2018-01-06 NOTE — Telephone Encounter (Signed)
Called patient back to check on him after procedure, reports that he is doing so much better and would like to thank Dr Holley Raring so much as well as all the staff for taking such excellent care of him.  I told him that I would convey his sentiments to  Dr Holley Raring as well as the rest of the staff.

## 2018-01-06 NOTE — Telephone Encounter (Signed)
Pt called and said the cell phone towers are down near his house and wanted to know if anyone had tried to call and check on him yet since he had a procedure yesterday. Nurses were all busy when I went back to ask if anyone had tried to call him. But pt just wanted to make sure we called the [phone] number if anyone calls today to check on him.

## 2018-01-12 ENCOUNTER — Other Ambulatory Visit: Payer: Self-pay | Admitting: Family Medicine

## 2018-01-12 NOTE — Telephone Encounter (Signed)
Pt calling about the RXinsulin lispro (HUMALOG KWIKPEN) 100 UNIT/ML KiwkPen  he is out of medicine and states that it is his fault he thought he had more left  Please call pt when RX is ready [phone]

## 2018-01-14 ENCOUNTER — Other Ambulatory Visit: Payer: Self-pay | Admitting: Family Medicine

## 2018-01-14 ENCOUNTER — Other Ambulatory Visit: Payer: Self-pay

## 2018-01-14 NOTE — Telephone Encounter (Signed)
Last OV 09/27/2017   Last refilled 10/12/2016 disp 15 ml with 3 refills   Sent to PCP for approval

## 2018-01-14 NOTE — Telephone Encounter (Signed)
Pt called on 01/12/2018 about being out of his insulin lispro. Pt does not have anymore and he's concerned about the weekend coming with no insulin. Please advise and Thank you?

## 2018-01-14 NOTE — Addendum Note (Signed)
Addended by: [name] on: 01/14/2018 04:15 PM   Modules accepted: Orders

## 2018-01-14 NOTE — Telephone Encounter (Signed)
Sent to PCP ?

## 2018-01-14 NOTE — Telephone Encounter (Signed)
It appears this was sent in to the pharmacy today, though the directions were from an old prescription. He should be taking 10 units with meals. Please call the pharmacy and change the directions for the prescription that was sent in. Thanks.

## 2018-01-14 NOTE — Telephone Encounter (Signed)
Called [facility] pharmacy to clarify direction 10 units twice daily.

## 2018-01-17 ENCOUNTER — Encounter: Payer: Self-pay | Admitting: Family Medicine

## 2018-01-17 ENCOUNTER — Ambulatory Visit (INDEPENDENT_AMBULATORY_CARE_PROVIDER_SITE_OTHER): Payer: Medicare Other | Admitting: Family Medicine

## 2018-01-17 VITALS — BP 140/80 | HR 82 | Temp 98.2°F | Ht 73.0 in | Wt 264.4 lb

## 2018-01-17 DIAGNOSIS — E1142 Type 2 diabetes mellitus with diabetic polyneuropathy: Secondary | ICD-10-CM | POA: Diagnosis not present

## 2018-01-17 DIAGNOSIS — I10 Essential (primary) hypertension: Secondary | ICD-10-CM | POA: Diagnosis not present

## 2018-01-17 DIAGNOSIS — Z794 Long term (current) use of insulin: Secondary | ICD-10-CM

## 2018-01-17 DIAGNOSIS — L989 Disorder of the skin and subcutaneous tissue, unspecified: Secondary | ICD-10-CM

## 2018-01-17 DIAGNOSIS — F32A Depression, unspecified: Secondary | ICD-10-CM

## 2018-01-17 DIAGNOSIS — F329 Major depressive disorder, single episode, unspecified: Secondary | ICD-10-CM

## 2018-01-17 DIAGNOSIS — F419 Anxiety disorder, unspecified: Secondary | ICD-10-CM

## 2018-01-17 LAB — POCT GLYCOSYLATED HEMOGLOBIN (HGB A1C): HEMOGLOBIN A1C: 7.6 % — AB (ref 4.0–5.6)

## 2018-01-17 NOTE — Progress Notes (Signed)
[name], MD Phone: [phone]  Alan Schmidt is a 72 y.o. male who presents today for f/u.  CC: Depression/anxiety, diabetes, hypertension  Depression/anxiety: Patient notes this is quite a bit improved.  He notes no depression or anxiety.  He takes Xanax 1-2 times daily.  He notes very occasional drowsiness with Xanax.  He does not drink alcohol with this he notes the Lexapro has been helpful.    Diabetes: Patient notes his blood glucose is all over the place.  Notes it was 142 today.  He uses [place] 40 units daily.  He is taking Humalog 10 units 3 times daily with meals.  He is taking Jardiance, metformin, and Victoza.  Sometimes increased thirst though this is related to dry mouth from his medication.  No hypoglycemia.  Hypertension: He is not checking at home.  He is taking amlodipine, carvedilol, HCTZ, and olmesartan.  No chest pain, shortness of breath, or edema.  Patient reports he does have a hard time keeping up with his medications at times.  He is going to do a mail order pharmacy that will place his medications into a pill pack that makes it easier for him to use.  Social History   Tobacco Use  Smoking Status Former Smoker  Smokeless Tobacco Never Used     ROS see history of present illness  Objective  Physical Exam Vitals:   01/17/18 1322  BP: 140/80  Pulse: 82  Temp: 98.2 F (36.8 C)    BP Readings from Last 3 Encounters:  01/17/18 140/80  01/05/18 127/86  12/07/17 124/76   Wt Readings from Last 3 Encounters:  01/17/18 264 lb 6.4 oz (119.9 kg)  01/05/18 255 lb (115.7 kg)  12/07/17 250 lb (113.4 kg)    Physical Exam Constitutional:      General: He is not in acute distress.    Appearance: He is not diaphoretic.  Cardiovascular:     Rate and Rhythm: Normal rate and regular rhythm.     Heart sounds: Normal heart sounds.  Pulmonary:     Effort: Pulmonary effort is normal.     Breath sounds: Normal breath sounds.  Skin:    General:  Skin is warm and dry.  Neurological:     Mental Status: He is alert.    Diabetic Foot Exam - Simple   Simple Foot Form Diabetic Foot exam was performed with the following findings:  Yes 01/17/2018  1:40 PM  Visual Inspection No deformities, no ulcerations, no other skin breakdown bilaterally:  Yes Sensation Testing See comments:  Yes Pulse Check Posterior Tibialis and Dorsalis pulse intact bilaterally:  Yes Comments Mild decreased monofilament testing bilateral toes, otherwise intact light touch and monofilament testing, patient does have a small hyperpigmented lesion on the posterior lateral portion of his midfoot on the right       Assessment/Plan: Please see individual problem list.  Essential hypertension Adequately controlled for age.  Continue current medication.  Diabetes (HCC) Check A1c.  Continue current medication.  Anxiety and depression Asymptomatic.  Continue Lexapro and Xanax as needed.  Lesion of skin of foot Referred to dermatology for evaluation.    Orders Placed This Encounter  Procedures  . Ambulatory referral to Dermatology    Referral Priority:   Routine    Referral Type:   Consultation    Referral Reason:   Specialty Services Required    Requested Specialty:   Dermatology    Number of Visits Requested:   1  . POCT HgB  A1C    No orders of the defined types were placed in this encounter.    [name], MD [place]

## 2018-01-17 NOTE — Assessment & Plan Note (Signed)
Check A1c.  Continue current medication. °

## 2018-01-17 NOTE — Assessment & Plan Note (Signed)
Asymptomatic.  Continue Lexapro and Xanax as needed.

## 2018-01-17 NOTE — Patient Instructions (Signed)
Nice to see you. We will check with our pharmacist regarding blood glucose testing. I would like to have you see a dermatologist for the spot on your foot. We will contact you with your A1c result. Please stick to 2-3 servings of cod per week.

## 2018-01-17 NOTE — Assessment & Plan Note (Signed)
Adequately controlled for age.  Continue current medication. 

## 2018-01-17 NOTE — Assessment & Plan Note (Signed)
Referred to dermatology for evaluation.

## 2018-01-18 ENCOUNTER — Ambulatory Visit: Payer: Self-pay

## 2018-01-18 ENCOUNTER — Other Ambulatory Visit: Payer: Self-pay | Admitting: Family Medicine

## 2018-01-18 NOTE — Telephone Encounter (Signed)
Pt wanted to clarify some information that he had given Dr. [name] regarding Lexapro. He had told him that he was not more sleepy after taking the lexapro but he was in fact sleepy. He discussed this with the pharmacist, who suggested that he take this med in the evening. He said that he would try that and it makes sense to him. He wanted me to pass on to Dr. [name] that he thinks he is a great doctor and just wanted him to know this.

## 2018-01-18 NOTE — Telephone Encounter (Addendum)
Returned call to patient. Left VM to return call to office. Pt has question about his lexapro.

## 2018-01-19 ENCOUNTER — Other Ambulatory Visit: Payer: Self-pay

## 2018-01-19 NOTE — Telephone Encounter (Signed)
Called and spoke with patient. Pt advised and voiced understanding.  

## 2018-01-19 NOTE — Telephone Encounter (Signed)
Thank you for the FYI. I agree with taking the lexapro at night. If he is still drowsy with this change he needs to let us know.

## 2018-01-19 NOTE — Telephone Encounter (Signed)
Sent to PCP to review

## 2018-01-20 ENCOUNTER — Other Ambulatory Visit: Payer: Self-pay | Admitting: Family Medicine

## 2018-01-20 NOTE — Progress Notes (Signed)
Pt scheduled on 12/30

## 2018-02-05 ENCOUNTER — Other Ambulatory Visit: Payer: Self-pay | Admitting: Family Medicine

## 2018-02-07 DIAGNOSIS — D485 Neoplasm of uncertain behavior of skin: Secondary | ICD-10-CM | POA: Diagnosis not present

## 2018-02-07 DIAGNOSIS — L720 Epidermal cyst: Secondary | ICD-10-CM | POA: Diagnosis not present

## 2018-02-07 DIAGNOSIS — L821 Other seborrheic keratosis: Secondary | ICD-10-CM | POA: Diagnosis not present

## 2018-02-28 ENCOUNTER — Other Ambulatory Visit: Payer: Self-pay | Admitting: Family Medicine

## 2018-03-02 ENCOUNTER — Encounter: Payer: Medicare Other | Admitting: Nurse Practitioner

## 2018-03-03 ENCOUNTER — Encounter: Payer: Self-pay | Admitting: Nurse Practitioner

## 2018-03-03 ENCOUNTER — Ambulatory Visit: Payer: Medicare Other | Attending: Nurse Practitioner | Admitting: Nurse Practitioner

## 2018-03-03 ENCOUNTER — Other Ambulatory Visit: Payer: Self-pay

## 2018-03-03 VITALS — BP 132/76 | HR 101 | Temp 98.2°F | Ht 73.0 in | Wt 264.0 lb

## 2018-03-03 DIAGNOSIS — Z79891 Long term (current) use of opiate analgesic: Secondary | ICD-10-CM | POA: Diagnosis not present

## 2018-03-03 DIAGNOSIS — M25551 Pain in right hip: Secondary | ICD-10-CM

## 2018-03-03 DIAGNOSIS — M5412 Radiculopathy, cervical region: Secondary | ICD-10-CM | POA: Diagnosis not present

## 2018-03-03 DIAGNOSIS — G894 Chronic pain syndrome: Secondary | ICD-10-CM | POA: Diagnosis not present

## 2018-03-03 DIAGNOSIS — M7918 Myalgia, other site: Secondary | ICD-10-CM

## 2018-03-03 DIAGNOSIS — G8929 Other chronic pain: Secondary | ICD-10-CM

## 2018-03-03 DIAGNOSIS — M47816 Spondylosis without myelopathy or radiculopathy, lumbar region: Secondary | ICD-10-CM | POA: Diagnosis not present

## 2018-03-03 DIAGNOSIS — M79644 Pain in right finger(s): Secondary | ICD-10-CM | POA: Diagnosis not present

## 2018-03-03 MED ORDER — OXYCODONE HCL 10 MG PO TABS: 10.0000 mg | ORAL_TABLET | Freq: Four times a day (QID) | ORAL | 0 refills | Status: DC | PRN

## 2018-03-03 MED ORDER — CYCLOBENZAPRINE HCL 10 MG PO TABS: 10.0000 mg | ORAL_TABLET | Freq: Three times a day (TID) | ORAL | 2 refills | Status: DC | PRN

## 2018-03-03 NOTE — Patient Instructions (Addendum)
____________________________________________________________________________________________  Medication Rules  Purpose: To inform patients, and their family members, of our rules and regulations.  Applies to: All patients receiving prescriptions (written or electronic).  Pharmacy of record: Pharmacy where electronic prescriptions will be sent. If written prescriptions are taken to a different pharmacy, please inform the nursing staff. The pharmacy listed in the electronic medical record should be the one where you would like electronic prescriptions to be sent.  Electronic prescriptions: In compliance with the [place]reng[place] Opioid Misuse Prevention (STOP) Act of 2017 (Session Law 2017-74/H243), effective February 02, 2018, all controlled substances must be electronically prescribed. Calling prescriptions to the pharmacy will cease to exist.  Prescription refills: Only during scheduled appointments. Applies to all prescriptions.  NOTE: The following applies primarily to controlled substances (Opioid* Pain Medications).   Patient's responsibilities: 1. Pain Pills: Bring all pain pills to every appointment (except for procedure appointments). 2. Pill Bottles: Bring pills in original pharmacy bottle. Always bring the newest bottle. Bring bottle, even if empty. 3. Medication refills: You are responsible for knowing and keeping track of what medications you take and those you need refilled. The day before your appointment: write a list of all prescriptions that need to be refilled. The day of the appointment: give the list to the admitting nurse. Prescriptions will be written only during appointments. If you forget a medication: it will not be "Called in", "Faxed", or "electronically sent". You will need to get another appointment to get these prescribed. No early refills. Do not call asking to have your prescription filled early. 4. Prescription Accuracy: You are responsible for  carefully inspecting your prescriptions before leaving our office. Have the discharge nurse carefully go over each prescription with you, before taking them home. Make sure that your name is accurately spelled, that your address is correct. Check the name and dose of your medication to make sure it is accurate. Check the number of pills, and the written instructions to make sure they are clear and accurate. Make sure that you are given enough medication to last until your next medication refill appointment. 5. Taking Medication: Take medication as prescribed. When it comes to controlled substances, taking less pills or less frequently than prescribed is permitted and encouraged. Never take more pills than instructed. Never take medication more frequently than prescribed.  6. Inform other Doctors: Always inform, all of your healthcare providers, of all the medications you take. 7. Pain Medication from other Providers: You are not allowed to accept any additional pain medication from any other Doctor or Healthcare provider. There are two exceptions to this rule. (see below) In the event that you require additional pain medication, you are responsible for notifying us, as stated below. 8. Medication Agreement: You are responsible for carefully reading and following our Medication Agreement. This must be signed before receiving any prescriptions from our practice. Safely store a copy of your signed Agreement. Violations to the Agreement will result in no further prescriptions. (Additional copies of our Medication Agreement are available upon request.) 9. Laws, Rules, & Regulations: All patients are expected to follow all Federal and State Laws, Statutes, Rules, & Regulations. Ignorance of the Laws does not constitute a valid excuse. The use of any illegal substances is prohibited. 10. Adopted CDC guidelines & recommendations: Target dosing levels will be at or below 60 MME/day. Use of benzodiazepines** is not  recommended.  Exceptions: There are only two exceptions to the rule of not receiving pain medications from other Healthcare Providers. 1.   Exception #1 (Emergencies): In the event of an emergency (i.e.: accident requiring emergency care), you are allowed to receive additional pain medication. However, you are responsible for: As soon as you are able, call our office [phone], at any time of the day or night, and leave a message stating your name, the date and nature of the emergency, and the name and dose of the medication prescribed. In the event that your call is answered by a member of our staff, make sure to document and save the date, time, and the name of the person that took your information.  2. Exception #2 (Planned Surgery): In the event that you are scheduled by another doctor or dentist to have any type of surgery or procedure, you are allowed (for a period no longer than 30 days), to receive additional pain medication, for the acute post-op pain. However, in this case, you are responsible for picking up a copy of our "Post-op Pain Management for Surgeons" handout, and giving it to your surgeon or dentist. This document is available at our office, and does not require an appointment to obtain it. Simply go to our office during business hours (Monday-Thursday from 8:00 AM to 4:00 PM) (Friday 8:00 AM to 12:00 Noon) or if you have a scheduled appointment with [place], prior to your surgery, and ask for it by name. In addition, you will need to provide [place] with your name, name of your surgeon, type of surgery, and date of procedure or surgery.  *Opioid medications include: morphine, codeine, oxycodone, oxymorphone, hydrocodone, hydromorphone, meperidine, tramadol, tapentadol, buprenorphine, fentanyl, methadone. **Benzodiazepine medications include: diazepam (Valium), alprazolam (Xanax), clonazepam (Klonopine), lorazepam (Ativan), clorazepate (Tranxene), chlordiazepoxide (Librium), estazolam (Prosom),  oxazepam (Serax), temazepam (Restoril), triazolam (Halcion) (Last updated: 04/01/2017) ____________________________________________________________________________________________    BMI Assessment: Estimated body mass index is 34.83 kg/m as calculated from the following:   Height as of this encounter: 6\' 1"  (1.854 m).   Weight as of this encounter: 264 lb (119.7 kg).  BMI interpretation table: BMI level Category Range association with higher incidence of chronic pain  <18 kg/m2 Underweight   18.5-24.9 kg/m2 Ideal body weight   25-29.9 kg/m2 Overweight Increased incidence by 20%  30-34.9 kg/m2 Obese (Class I) Increased incidence by 68%  35-39.9 kg/m2 Severe obesity (Class II) Increased incidence by 136%  >40 kg/m2 Extreme obesity (Class III) Increased incidence by 254%   Patient's current BMI Ideal Body weight  Body mass index is 34.83 kg/m. Ideal body weight: 79.9 kg (176 lb 2.4 oz) Adjusted ideal body weight: 95.8 kg (211 lb 4.6 oz)   BMI Readings from Last 4 Encounters:  03/03/18 34.83 kg/m  01/17/18 34.88 kg/m  01/05/18 33.64 kg/m  12/07/17 32.98 kg/m   Wt Readings from Last 4 Encounters:  03/03/18 264 lb (119.7 kg)  01/17/18 264 lb 6.4 oz (119.9 kg)  01/05/18 255 lb (115.7 kg)  12/07/17 250 lb (113.4 kg)

## 2018-03-03 NOTE — Progress Notes (Signed)
Nursing Pain Medication Assessment:  Safety precautions to be maintained throughout the outpatient stay will include: orient to surroundings, keep bed in low position, maintain call bell within reach at all times, provide assistance with transfer out of bed and ambulation.  Medication Inspection Compliance: Pill count conducted under aseptic conditions, in front of the patient. Neither the pills nor the bottle was removed from the patient's sight at any time. Once count was completed pills were immediately returned to the patient in their original bottle.  Medication: Oxycodone IR Pill/Patch Count: 41 of 120 pills remain Pill/Patch Appearance: Markings consistent with prescribed medication Bottle Appearance: Standard pharmacy container. Clearly labeled. Filled Date: unknown Last Medication intake:  Today

## 2018-03-03 NOTE — Progress Notes (Signed)
Patient's Name: Alan Schmidt  MRN: [id number]  Referring Provider: [name], MD  DOB: 11-Jul-1945  PCP: [name], MD  DOS: 03/03/2018  Note by: Vevelyn Francois NP  Service setting: Ambulatory outpatient  Specialty: Interventional Pain Management  Location: [facility] (AMB) Pain Management Facility    Patient type: Established    Primary Reason(s) for Visit: Encounter for prescription drug management & post-procedure evaluation of chronic illness with mild to moderate exacerbation(Level of risk: moderate) CC: Back Pain  HPI  Alan Schmidt is a 73 y.o. year old, male patient, who comes today for a post-procedure evaluation and medication management. He has Paraparesis ([place]); Chronic low back pain (Primary Source of Pain) (Bilateral) (R>L); Lumbar facet syndrome (Bilateral) (R>L); Lumbar spondylosis; Diabetic peripheral neuropathy ([place]); Long term current use of opiate analgesic; Long term prescription opiate use; Opiate use (60 MME/Day); Opiate dependence ([place]); Encounter for therapeutic drug level monitoring; Chronic neck pain (midline over the C7 spinous processes) (L>R); Neurogenic pain; Neuropathic pain; Contrast dye allergy; Chronic lower extremity pain (Secondary source of pain) (Bilateral) (R>L); 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) (Right L5 dermatome); History of total hip replacement (Right); Chronic hip pain (Right); Class I Morbid obesity (HCC) (68% higher incidence of chronic low back pain); Essential hypertension; GERD (gastroesophageal reflux disease); Obstructive sleep apnea; Hyperlipidemia; Chronic kidney disease (CKD); Abnormal nerve conduction studies (severe bilateral lower extremity polyneuropathy); Chronic shoulder pain (Bilateral) (R>L); Occipital neuralgia (Left); Cervicogenic headache (Left); Diabetes ([place]); BPH (benign prostatic hyperplasia); History of Vasovagal response to spinal injections; Allergic rhinitis; Anxiety and depression; Disturbance of skin  sensation; Chronic shoulder radicular pain (Left); Chronic cervical radicular pain (Left); Chronic pain syndrome; Opioid-induced constipation ([facility]); Encounter for general adult medical examination with abnormal findings; History of postoperative nausea and vomiting; Nevus; Lumbar facet joint osteoarthritis (Bilateral); History of allergy to radiographic contrast media; Hypotension during surgery; Chronic headache; Chronic tension-type headache, intractable; Vertigo; Coccygodynia; DDD (degenerative disc disease), lumbar; Chronic musculoskeletal pain; Chronic pain of both knees; Lesion of skin of foot; and Thumb pain, right on their problem list. His primarily concern today is the Back Pain  Pain Assessment: Location: Lower Back Radiating: pain radiaties down buttock down left leg Onset: More than a month ago Duration: Chronic pain Quality: Throbbing, Constant, Nagging, Discomfort Severity: 2 /10 (subjective, self-reported pain score)  Note: Reported level is compatible with observation.                          Effect on ADL: limits my daily activities Timing: Constant Modifying factors: medications and rest BP: 132/76  HR: (!) 101  Alan Schmidt was last seen on 01/05/2018 for a procedure. During today's appointment we reviewed Alan Schmidt's post-procedure results, as well as his outpatient medication regimen.  He is complaining of right thumb pain.  He admits that he is having difficulties straightening his thumb and opening containers.  He denies any injury.  He admits that the pain came on all of a sudden.  He admits that this limits his daily activity and playing music.  He admits that the thumb is locking. He is currently not using any anti-inflammatories but does use Tylenol.  Further details on both, my assessment(s), as well as the proposed treatment plan, please see below.  Controlled Substance Pharmacotherapy Assessment REMS (Risk Evaluation and Mitigation Strategy)   Analgesic:Oxycodone IR 10 mg every 6 hours (40 mg/day) MME/day:60 mg/day. Chauncey Fischer, RN  03/03/2018  9:46 AM  Sign when Signing Visit Nursing Pain Medication Assessment:  Safety precautions to be maintained throughout the outpatient stay will include: orient to surroundings, keep bed in low position, maintain call bell within reach at all times, provide assistance with transfer out of bed and ambulation.  Medication Inspection Compliance: Pill count conducted under aseptic conditions, in front of the patient. Neither the pills nor the bottle was removed from the patient's sight at any time. Once count was completed pills were immediately returned to the patient in their original bottle.  Medication: Oxycodone IR Pill/Patch Count: 41 of 120 pills remain Pill/Patch Appearance: Markings consistent with prescribed medication Bottle Appearance: Standard pharmacy container. Clearly labeled. Filled Date: unknown Last Medication intake:  Today   Pharmacokinetics: Liberation and absorption (onset of action): WNL Distribution (time to peak effect): WNL Metabolism and excretion (duration of action): WNL         Pharmacodynamics: Desired effects: Analgesia: Alan Schmidt reports >50% benefit. Functional ability: Patient reports that medication allows him to accomplish basic ADLs Clinically meaningful improvement in function (CMIF): Sustained CMIF goals met Perceived effectiveness: Described as relatively effective, allowing for increase in activities of daily living (ADL) Undesirable effects: Side-effects or Adverse reactions: None reported Monitoring: [place] PMP: Online review of the past 35-monthperiod conducted. Compliant with practice rules and regulations Last UDS on record: Summary  Date Value Ref Range Status  05/18/2017 FINAL  Final    Comment:    ==================================================================== TOXASSURE SELECT 13  (MW) ==================================================================== Test                             Result       Flag       Units Drug Present and Declared for Prescription Verification   Alprazolam                     39           EXPECTED   ng/mg creat   Alpha-hydroxyalprazolam        54           EXPECTED   ng/mg creat    Source of alprazolam is a scheduled prescription medication.    Alpha-hydroxyalprazolam is an expected metabolite of alprazolam.   Oxycodone                      3568         EXPECTED   ng/mg creat   Oxymorphone                    63           EXPECTED   ng/mg creat   Noroxycodone                   2463         EXPECTED   ng/mg creat    Sources of oxycodone include scheduled prescription medications.    Oxymorphone and noroxycodone are expected metabolites of    oxycodone. Oxymorphone is also available as a scheduled    prescription medication. ==================================================================== Test                      Result    Flag   Units      Ref Range   Creatinine              98  mg/dL      >=20 ==================================================================== Declared Medications:  The flagging and interpretation on this report are based on the  following declared medications.  Unexpected results may arise from  inaccuracies in the declared medications.  **Note: The testing scope of this panel includes these medications:  Alprazolam  Oxycodone  **Note: The testing scope of this panel does not include following  reported medications:  Acetaminophen  Amlodipine  Aspirin  Carvedilol  Cyclobenzaprine  Desoximetasone  Empagliflozin  Fluoxetine  Hydrochlorothiazide (HCTZ)  Hydroxyzine  Insulin  Liraglutide (Victoza)  Loratadine  Magnesium  Metformin  Mometasone  Naloxone  Olmesartan  Pantoprazole  Pravastatin  Tamsulosin ==================================================================== For clinical  consultation, please call [phone]. ====================================================================    UDS interpretation: Compliant          Medication Assessment Form: Reviewed. Patient indicates being compliant with therapy Treatment compliance: Compliant Risk Assessment Profile: Aberrant behavior: See prior evaluations. None observed or detected today Comorbid factors increasing risk of overdose: See prior notes. No additional risks detected today Opioid risk tool (ORT) (Total Score): 0 Personal History of Substance Abuse (SUD-Substance use disorder):  Alcohol: Negative  Illegal Drugs: Negative  Rx Drugs: Negative  ORT Risk Level calculation: Low Risk Risk of substance use disorder (SUD): Low Opioid Risk Tool - 03/03/18 0959      Family History of Substance Abuse   Alcohol  Negative    Illegal Drugs  Negative    Rx Drugs  Negative      Personal History of Substance Abuse   Alcohol  Negative    Illegal Drugs  Negative    Rx Drugs  Negative      Age   Age between 50-45 years   No      History of Preadolescent Sexual Abuse   History of Preadolescent Sexual Abuse  Negative or Male      Psychological Disease   Psychological Disease  Negative    Depression  Negative      Total Score   Opioid Risk Tool Scoring  0    Opioid Risk Interpretation  Low Risk      ORT Scoring interpretation table:  Score <3 = Low Risk for SUD  Score between 4-7 = Moderate Risk for SUD  Score >8 = High Risk for Opioid Abuse   Risk Mitigation Strategies:  Patient Counseling: Covered Patient-Prescriber Agreement (PPA): Present and active  Notification to other healthcare providers: Done  Pharmacologic Plan: No change in therapy, at this time.             Post-Procedure Assessment  01/05/2018 Procedure: Right-sided lumbar facet radiofrequency ablation Pre-procedure pain score:  3/10 Post-procedure pain score: 0/10         Influential Factors: BMI: 34.83 kg/m Intra-procedural  challenges: None observed.         Assessment challenges: None detected.              Reported side-effects: None.        Post-procedural adverse reactions or complications: None reported         Sedation: Please see nurses note. When no sedatives are used, the analgesic levels obtained are directly associated to the effectiveness of the local anesthetics. However, when sedation is provided, the level of analgesia obtained during the initial 1 hour following the intervention, is believed to be the result of a combination of factors. These factors may include, but are not limited to: 1. The effectiveness of the local anesthetics used. 2. The effects of  the analgesic(s) and/or anxiolytic(s) used. 3. The degree of discomfort experienced by the patient at the time of the procedure. 4. The patients ability and reliability in recalling and recording the events. 5. The presence and influence of possible secondary gains and/or psychosocial factors. Reported result: Relief experienced during the 1st hour after the procedure: 100 % (Ultra-Short Term Relief)            Interpretative annotation: Clinically appropriate result. Analgesia during this period is likely to be Local Anesthetic and/or IV Sedative (Analgesic/Anxiolytic) related.          Effects of local anesthetic: The analgesic effects attained during this period are directly associated to the localized infiltration of local anesthetics and therefore cary significant diagnostic value as to the etiological location, or anatomical origin, of the pain. Expected duration of relief is directly dependent on the pharmacodynamics of the local anesthetic used. Long-acting (4-6 hours) anesthetics used.  Reported result: Relief during the next 4 to 6 hour after the procedure: 100 % (Short-Term Relief)            Interpretative annotation: Clinically appropriate result. Analgesia during this period is likely to be Local Anesthetic-related.          Long-term  benefit: Defined as the period of time past the expected duration of local anesthetics (1 hour for short-acting and 4-6 hours for long-acting). With the possible exception of prolonged sympathetic blockade from the local anesthetics, benefits during this period are typically attributed to, or associated with, other factors such as analgesic sensory neuropraxia, antiinflammatory effects, or beneficial biochemical changes provided by agents other than the local anesthetics.  Reported result: Extended relief following procedure: 100 %(lasted for 2 weeks) (Long-Term Relief)            Interpretative annotation: Clinically possible results. Good relief. No permanent benefit expected. Inflammation plays a part in the etiology to the pain.          Current benefits: Defined as reported results that persistent at this point in time.   Analgesia: 50 % Mr. Kishimoto reports improvement of axial symptoms. Function: Mr. Denn reports improvement in function ROM: Mr. Shepherd reports improvement in ROM Interpretative annotation:       Adequate RF ablation.          Interpretation: Results would suggest adequate radiofrequency ablation.                  Plan:  Please see "Plan of Care" for details.                Laboratory Chemistry  Inflammation Markers (CRP: Acute Phase) (ESR: Chronic Phase)                        Rheumatology Markers No results found for: RF, ANA, LABURIC, URICUR, LYMEIGGIGMAB, LYMEABIGMQN, HLAB27                      Renal Function Markers Lab Results  Component Value Date   BUN 16 09/27/2017   CREATININE 1.11 09/27/2017   GFRAA >60 10/25/2016   GFRNONAA >60 10/25/2016                             Hepatic Function Markers Lab Results  Component Value Date   AST 27 09/27/2017   ALT 39 09/27/2017   ALBUMIN 4.1 09/27/2017   ALKPHOS 82 09/27/2017  Electrolytes Lab Results  Component Value Date   NA 138 09/27/2017   K 4.2 09/27/2017   CL 97  09/27/2017   CALCIUM 10.0 09/27/2017   MG 1.8 03/06/2015                        Neuropathy Markers Lab Results  Component Value Date   HGBA1C 7.6 (A) 01/17/2018                        CNS Tests No results found for: COLORCSF, APPEARCSF, RBCCOUNTCSF, WBCCSF, POLYSCSF, LYMPHSCSF, EOSCSF, PROTEINCSF, GLUCCSF, JCVIRUS, CSFOLI, IGGCSF                      Bone Pathology Markers No results found for: VD25OH, [id number], AT5573UK0, UR4270WC3, 25OHVITD1, 25OHVITD2, 25OHVITD3, TESTOFREE, TESTOSTERONE                       Coagulation Parameters Lab Results  Component Value Date   PLT 255.0 09/27/2017                        Cardiovascular Markers Lab Results  Component Value Date   HGB 14.1 09/27/2017   HCT 43.9 09/27/2017                         CA Markers No results found for: CEA, CA125, LABCA2                      Note: Lab results reviewed.  Recent Diagnostic Imaging Results  DG C-Arm 1-60 Min-No Report Fluoroscopy was utilized by the requesting physician.  No radiographic  interpretation.   Complexity Note: Imaging results reviewed. Results shared with Mr. [place] Silos, using Layman's terms.                         Meds   Current Outpatient Medications:  .  ACCU-CHEK FASTCLIX LANCETS MISC, CHECK 4 TIMES A DAY, Disp: 102 each, Rfl: 2 .  ACCU-CHEK FASTCLIX LANCETS MISC, CHECK 4 TIMES A DAY, Disp: 102 each, Rfl: 0 .  acetaminophen (TYLENOL) 500 MG tablet, Take 500 mg by mouth every 6 (six) hours as needed., Disp: , Rfl:  .  ALPRAZolam (XANAX) 1 MG tablet, Take 0.5 tablets (0.5 mg total) by mouth 2 (two) times daily as needed for anxiety., Disp: 30 tablet, Rfl: 1 .  amLODipine (NORVASC) 5 MG tablet, TAKE 1 TABLET BY MOUTH EVERY DAY, Disp: 90 tablet, Rfl: 3 .  aspirin EC 81 MG tablet, Take 81 mg by mouth daily., Disp: , Rfl:  .  BD PEN NEEDLE NANO U/F 32G X 4 MM MISC, USE EVERY DAY, Disp: 100 each, Rfl: 4 .  blood glucose meter kit and supplies KIT, Accu-Chek guide meter and  Accu-Chek guide test strips with Accu-Chek fast clix lancets, check blood sugars 4 times daily, ICD 10 code E11.9, Disp: 1 each, Rfl: 0 .  carvedilol (COREG) 25 MG tablet, TAKE 1 TABLET BY MOUTH TWICE A DAY, Disp: 180 tablet, Rfl: 1 .  desoximetasone (TOPICORT) 0.25 % cream, APPLY CREAM TO AFFECTED AREA TWO TIMES DAILY, Disp: , Rfl: 3 .  escitalopram (LEXAPRO) 10 MG tablet, TAKE 1 TABLET (10 MG TOTAL) BY MOUTH DAILY. START THE DAY AFTER STOPPING PROZAC., Disp: 90 tablet, Rfl: 1 .  glucose blood (ACCU-CHEK GUIDE) test  strip, Use as instructed, Disp: 100 each, Rfl: 5 .  HUMALOG KWIKPEN 100 UNIT/ML KwikPen, INJECT 0.12-0.18 MLS (12-18 UNITS TOTAL) INTO THE SKIN 2 (TWO) TIMES DAILY WITH A MEAL., Disp: 15 pen, Rfl: 3 .  hydrochlorothiazide (HYDRODIURIL) 25 MG tablet, TAKE 1 TABLET BY MOUTH EVERY DAY, Disp: 90 tablet, Rfl: 1 .  hydrOXYzine (ATARAX/VISTARIL) 50 MG tablet, TAKE 1 TABLET BY MOUTH FOUR TIMES A DAY AS NEEDED, Disp: 90 tablet, Rfl: 1 .  JARDIANCE 25 MG TABS tablet, TAKE 25 MG BY MOUTH DAILY., Disp: 30 tablet, Rfl: 3 .  loratadine (CLARITIN) 10 MG tablet, Take 1 tablet (10 mg total) by mouth daily., Disp: 30 tablet, Rfl: 11 .  magnesium 30 MG tablet, Take 30 mg by mouth 2 (two) times daily., Disp: , Rfl:  .  metFORMIN (GLUCOPHAGE) 1000 MG tablet, TAKE 1 TABLET (1,000 MG TOTAL) BY MOUTH 2 (TWO) TIMES DAILY WITH A MEAL., Disp: 180 tablet, Rfl: 2 .  mometasone (NASONEX) 50 MCG/ACT nasal spray, PLACE 2 SPRAYS INTO THE NOSE DAILY., Disp: 17 g, Rfl: 4 .  naloxone (NARCAN) 2 MG/2ML injection, Inject content of syringe into thigh muscle. Call 911., Disp: 2 Syringe, Rfl: 1 .  olmesartan (BENICAR) 40 MG tablet, TAKE 1 TABLET BY MOUTH EVERY DAY, Disp: 90 tablet, Rfl: 2 .  pantoprazole (PROTONIX) 40 MG tablet, TAKE 1 TABLET BY MOUTH EVERY DAY, Disp: 90 tablet, Rfl: 1 .  pravastatin (PRAVACHOL) 40 MG tablet, TAKE 1 TABLET BY MOUTH EVERY DAY, Disp: 90 tablet, Rfl: 1 .  tamsulosin (FLOMAX) 0.4 MG CAPS capsule,  TAKE 1 CAPSULE (0.4 MG TOTAL) BY MOUTH 2 (TWO) TIMES DAILY., Disp: 180 capsule, Rfl: 2 .  TRESIBA FLEXTOUCH 100 UNIT/ML SOPN FlexTouch Pen, INJECT 40 UNITS INTO THE SKIN DAILY AT 10 PM., Disp: 15 pen, Rfl: 3 .  VICTOZA 18 MG/3ML SOPN, INJECT 0.3 MLS (1.8 MG TOTAL) INTO THE SKIN DAILY., Disp: 27 pen, Rfl: 1 .  [START ON 03/07/2018] cyclobenzaprine (FLEXERIL) 10 MG tablet, Take 1 tablet (10 mg total) by mouth 3 (three) times daily as needed for muscle spasms. Must last 30 days., Disp: 90 tablet, Rfl: 2 .  [START ON 05/17/2018] Oxycodone HCl 10 MG TABS, Take 1 tablet (10 mg total) by mouth every 6 (six) hours as needed for up to 30 days., Disp: 120 tablet, Rfl: 0 .  [START ON 04/17/2018] Oxycodone HCl 10 MG TABS, Take 1 tablet (10 mg total) by mouth every 6 (six) hours as needed for up to 30 days., Disp: 120 tablet, Rfl: 0 .  [START ON 03/18/2018] Oxycodone HCl 10 MG TABS, Take 1 tablet (10 mg total) by mouth every 6 (six) hours as needed for up to 30 days., Disp: 120 tablet, Rfl: 0  ROS  Constitutional: Denies any fever or chills Gastrointestinal: No reported hemesis, hematochezia, vomiting, or acute GI distress Musculoskeletal: Denies any acute onset joint swelling, redness, loss of ROM, or weakness Neurological: No reported episodes of acute onset apraxia, aphasia, dysarthria, agnosia, amnesia, paralysis, loss of coordination, or loss of consciousness  Allergies  Mr. Patty is allergic to contrast media [iodinated diagnostic agents]; iodine; and shellfish allergy.  PFSH  Drug: Mr. Kellogg  reports no history of drug use. Alcohol:  reports no history of alcohol use. Tobacco:  reports that he has quit smoking. He has never used smokeless tobacco. Medical:  has a past medical history of Acute postoperative pain (11/24/2016), Anxiety, Chronic hip pain (Right) (12/05/2014), Chronic lumbar pain, Depression, Hyperlipidemia, Hypertension, Kidney  stones, and Migraines. Surgical: Mr. Augustus  has a past  surgical history that includes Total hip arthroplasty; Tonsillectomy; and right hip surgery. Family: family history includes Cancer in his mother; Heart disease in his father; Stroke in his father.  Constitutional Exam  General appearance: Well nourished, well developed, and well hydrated. In no apparent acute distress Vitals:   03/03/18 0946  BP: 132/76  Pulse: (!) 101  Temp: 98.2 F (36.8 C)  SpO2: 99%  Weight: 264 lb (119.7 kg)  Height: 6' 1"  (1.854 m)  Psych/Mental status: Alert, oriented x 3 (person, place, & time)       Eyes: PERLA Respiratory: No evidence of acute respiratory distress  Cervical Spine Area Exam  Skin & Axial Inspection: No masses, redness, edema, swelling, or associated skin lesions Alignment: Symmetrical Functional ROM: Unrestricted ROM      Stability: No instability detected Muscle Tone/Strength: Functionally intact. No obvious neuro-muscular anomalies detected. Sensory (Neurological): Unimpaired Palpation: No palpable anomalies              Upper Extremity (UE) Exam    Side: Right upper extremity  Side: Left upper extremity  Skin & Extremity Inspection: Edema  Skin & Extremity Inspection: Skin color, temperature, and hair growth are WNL. No peripheral edema or cyanosis. No masses, redness, swelling, asymmetry, or associated skin lesions. No contractures.  Functional ROM: Decreased ROM          Functional ROM: Unrestricted ROM          Muscle Tone/Strength: Movement possible against some resistance (4/5)  Muscle Tone/Strength: Functionally intact. No obvious neuro-muscular anomalies detected.  Sensory (Neurological): Movement-associated discomfort          Sensory (Neurological): Unimpaired          Palpation: Increased muscle tone              Palpation: No palpable anomalies                   Thoracic Spine Area Exam  Skin & Axial Inspection: No masses, redness, or swelling Alignment: Symmetrical Functional ROM: Unrestricted ROM Stability: No  instability detected Muscle Tone/Strength: Functionally intact. No obvious neuro-muscular anomalies detected. Sensory (Neurological): Unimpaired Muscle strength & Tone: No palpable anomalies  Lumbar Spine Area Exam  Skin & Axial Inspection: No masses, redness, or swelling Alignment: Symmetrical Functional ROM: Unrestricted ROM       Stability: No instability detected Muscle Tone/Strength: Functionally intact. No obvious neuro-muscular anomalies detected. Sensory (Neurological): Unimpaired Palpation: Tender        Gait & Posture Assessment  Ambulation: Unassisted Gait: Relatively normal for age and body habitus Posture: WNL   Lower Extremity Exam    Side: Right lower extremity  Side: Left lower extremity  Stability: No instability observed          Stability: No instability observed          Skin & Extremity Inspection: Skin color, temperature, and hair growth are WNL. No peripheral edema or cyanosis. No masses, redness, swelling, asymmetry, or associated skin lesions. No contractures.  Skin & Extremity Inspection: Skin color, temperature, and hair growth are WNL. No peripheral edema or cyanosis. No masses, redness, swelling, asymmetry, or associated skin lesions. No contractures.  Functional ROM: Unrestricted ROM                  Functional ROM: Unrestricted ROM                  Muscle Tone/Strength:  Functionally intact. No obvious neuro-muscular anomalies detected.  Muscle Tone/Strength: Functionally intact. No obvious neuro-muscular anomalies detected.  Sensory (Neurological): Unimpaired        Sensory (Neurological): Unimpaired            Palpation: No palpable anomalies  Palpation: No palpable anomalies   Assessment  Primary Diagnosis & Pertinent Problem List: The primary encounter diagnosis was Lumbar spondylosis. Diagnoses of Chronic hip pain (Right), Thumb pain, right, Chronic cervical radicular pain (Left), Chronic musculoskeletal pain, Chronic pain syndrome, and Long term  prescription opiate use were also pertinent to this visit.  Status Diagnosis  Controlled Controlled Worsening 1. Lumbar spondylosis   2. Chronic hip pain (Right)   3. Thumb pain, right   4. Chronic cervical radicular pain (Left)   5. Chronic musculoskeletal pain   6. Chronic pain syndrome   7. Long term prescription opiate use     Problems updated and reviewed during this visit: Problem  Thumb Pain, Right   Plan of Care  Pharmacotherapy (Medications Ordered): Meds ordered this encounter  Medications  . cyclobenzaprine (FLEXERIL) 10 MG tablet    Sig: Take 1 tablet (10 mg total) by mouth 3 (three) times daily as needed for muscle spasms. Must last 30 days.    Dispense:  90 tablet    Refill:  2    Do not place this medication, or any other prescription from our practice, on "Automatic Refill". Patient may have prescription filled one day early if pharmacy is closed on scheduled refill date. No refills.    Order Specific Question:   Supervising Provider    Answer:   Milinda Pointer [phone]  . Oxycodone HCl 10 MG TABS    Sig: Take 1 tablet (10 mg total) by mouth every 6 (six) hours as needed for up to 30 days.    Dispense:  120 tablet    Refill:  0    Do not add this medication to the electronic "Automatic Refill" notification system. Patient may have prescription filled one day early if pharmacy is closed on scheduled refill date.    Order Specific Question:   Supervising Provider    Answer:   Milinda Pointer [phone]  . Oxycodone HCl 10 MG TABS    Sig: Take 1 tablet (10 mg total) by mouth every 6 (six) hours as needed for up to 30 days.    Dispense:  120 tablet    Refill:  0    Do not add this medication to the electronic "Automatic Refill" notification system. Patient may have prescription filled one day early if pharmacy is closed on scheduled refill date.    Order Specific Question:   Supervising Provider    Answer:   Milinda Pointer [phone]  . Oxycodone HCl 10 MG  TABS    Sig: Take 1 tablet (10 mg total) by mouth every 6 (six) hours as needed for up to 30 days.    Dispense:  120 tablet    Refill:  0    Do not add this medication to the electronic "Automatic Refill" notification system. Patient may have prescription filled one day early if pharmacy is closed on scheduled refill date.    Order Specific Question:   Supervising Provider    Answer:   Milinda Pointer [[id number]]   New Prescriptions   No medications on file   Medications administered today: Karmen Stabs. Lober had no medications administered during this visit. Lab-work, procedure(s), and/or referral(s): Orders Placed This Encounter  Procedures  .  Small Joint Injection/Arthrocentesis  . ToxASSURE Select 13 (MW), Urine   Imaging and/or referral(s): None  Interventional therapies: Diagnostic Right intra-articular thumb injection   Considering:  Palliative bilateral lumbar facet RFA (Right done on 02/17/16 & 04/30/15) Diagnostic Left GONB Possible Left GON RFA Palliative Left CESI   Palliative PRN treatment(s): Diagnostic Left GONB    Provider-requested follow-up: Return in about 3 months (around 06/02/2018) for MedMgmt, and, Procedure(NS), w/ Dr. Dossie Arbour.  Future Appointments  Date Time Provider [place]  03/15/2018  1:00 PM Milinda Pointer, MD [facility] None  04/26/2018  1:15 PM [name], MD [facility]  05/25/2018 10:45 AM Vevelyn Francois, NP [facility] None   Primary Care Physician: [name], MD Location: [facility] Management Facility Note by: Vevelyn Francois NP Date: 03/03/2018; Time: 12:38 PM  Pain Score Disclaimer: We use the NRS-11 scale. This is a self-reported, subjective measurement of pain severity with only modest accuracy. It is used primarily to identify changes within a particular patient. It must be understood that outpatient pain scales are significantly less accurate that those used for research, where they can  be applied under ideal controlled circumstances with minimal exposure to variables. In reality, the score is likely to be a combination of pain intensity and pain affect, where pain affect describes the degree of emotional arousal or changes in action readiness caused by the sensory experience of pain. Factors such as social and work situation, setting, emotional state, anxiety levels, expectation, and prior pain experience may influence pain perception and show large inter-individual differences that may also be affected by time variables.  Patient instructions provided during this appointment: Patient Instructions   ____________________________________________________________________________________________  Medication Rules  Purpose: To inform patients, and their family members, of our rules and regulations.  Applies to: All patients receiving prescriptions (written or electronic).  Pharmacy of record: Pharmacy where electronic prescriptions will be sent. If written prescriptions are taken to a different pharmacy, please inform the nursing staff. The pharmacy listed in the electronic medical record should be the one where you would like electronic prescriptions to be sent.  Electronic prescriptions: In compliance with the [place] (STOP) Act of 2017 (Session Lanny Cramp [id number]), effective February 02, 2018, all controlled substances must be electronically prescribed. Calling prescriptions to the pharmacy will cease to exist.  Prescription refills: Only during scheduled appointments. Applies to all prescriptions.  NOTE: The following applies primarily to controlled substances (Opioid* Pain Medications).   Patient's responsibilities: 1. Pain Pills: Bring all pain pills to every appointment (except for procedure appointments). 2. Pill Bottles: Bring pills in original pharmacy bottle. Always bring the newest bottle. Bring bottle, even if  empty. 3. Medication refills: You are responsible for knowing and keeping track of what medications you take and those you need refilled. The day before your appointment: write a list of all prescriptions that need to be refilled. The day of the appointment: give the list to the admitting nurse. Prescriptions will be written only during appointments. If you forget a medication: it will not be "Called in", "Faxed", or "electronically sent". You will need to get another appointment to get these prescribed. No early refills. Do not call asking to have your prescription filled early. 4. Prescription Accuracy: You are responsible for carefully inspecting your prescriptions before leaving our office. Have the discharge nurse carefully go over each prescription with you, before taking them home. Make sure that your name is accurately spelled, that your address  is correct. Check the name and dose of your medication to make sure it is accurate. Check the number of pills, and the written instructions to make sure they are clear and accurate. Make sure that you are given enough medication to last until your next medication refill appointment. 5. Taking Medication: Take medication as prescribed. When it comes to controlled substances, taking less pills or less frequently than prescribed is permitted and encouraged. Never take more pills than instructed. Never take medication more frequently than prescribed.  6. Inform other Doctors: Always inform, all of your healthcare providers, of all the medications you take. 7. Pain Medication from other Providers: You are not allowed to accept any additional pain medication from any other Doctor or Healthcare provider. There are two exceptions to this rule. (see below) In the event that you require additional pain medication, you are responsible for notifying us, as stated below. 8. Medication Agreement: You are responsible for carefully reading and following our Medication  Agreement. This must be signed before receiving any prescriptions from our practice. Safely store a copy of your signed Agreement. Violations to the Agreement will result in no further prescriptions. (Additional copies of our Medication Agreement are available upon request.) 9. Laws, Rules, & Regulations: All patients are expected to follow all Federal and [employer], [employer], Rules, [employer]. Ignorance of the Laws does not constitute a valid excuse. The use of any illegal substances is prohibited. 10. Adopted CDC guidelines & recommendations: Target dosing levels will be at or below 60 MME/day. Use of benzodiazepines** is not recommended.  Exceptions: There are only two exceptions to the rule of not receiving pain medications from other Healthcare Providers. 1. Exception #1 (Emergencies): In the event of an emergency (i.e.: accident requiring emergency care), you are allowed to receive additional pain medication. However, you are responsible for: As soon as you are able, call our office [phone], at any time of the day or night, and leave a message stating your name, the date and nature of the emergency, and the name and dose of the medication prescribed. In the event that your call is answered by a member of our staff, make sure to document and save the date, time, and the name of the person that took your information.  2. Exception #2 (Planned Surgery): In the event that you are scheduled by another doctor or dentist to have any type of surgery or procedure, you are allowed (for a period no longer than 30 days), to receive additional pain medication, for the acute post-op pain. However, in this case, you are responsible for picking up a copy of our "Post-op Pain Management for Surgeons" handout, and giving it to your surgeon or dentist. This document is available at our office, and does not require an appointment to obtain it. Simply go to our office during business hours (Monday-Thursday from  8:00 AM to 4:00 PM) (Friday 8:00 AM to 12:00 Noon) or if you have a scheduled appointment with [place], prior to your surgery, and ask for it by name. In addition, you will need to provide [place] with your name, name of your surgeon, type of surgery, and date of procedure or surgery.  *Opioid medications include: morphine, codeine, oxycodone, oxymorphone, hydrocodone, hydromorphone, meperidine, tramadol, tapentadol, buprenorphine, fentanyl, methadone. **Benzodiazepine medications include: diazepam (Valium), alprazolam (Xanax), clonazepam (Klonopine), lorazepam (Ativan), clorazepate (Tranxene), chlordiazepoxide (Librium), estazolam (Prosom), oxazepam (Serax), temazepam (Restoril), triazolam (Halcion) (Last updated: 04/01/2017) ____________________________________________________________________________________________    BMI Assessment: Estimated body mass index is  34.83 kg/m as calculated from the following:   Height as of this encounter: 6' 1"  (1.854 m).   Weight as of this encounter: 264 lb (119.7 kg).  BMI interpretation table: BMI level Category Range association with higher incidence of chronic pain  <18 kg/m2 Underweight   18.5-24.9 kg/m2 Ideal body weight   25-29.9 kg/m2 Overweight Increased incidence by 20%  30-34.9 kg/m2 Obese (Class I) Increased incidence by 68%  35-39.9 kg/m2 Severe obesity (Class II) Increased incidence by 136%  >40 kg/m2 Extreme obesity (Class III) Increased incidence by 254%   Patient's current BMI Ideal Body weight  Body mass index is 34.83 kg/m. Ideal body weight: 79.9 kg (176 lb 2.4 oz) Adjusted ideal body weight: 95.8 kg (211 lb 4.6 oz)   BMI Readings from Last 4 Encounters:  03/03/18 34.83 kg/m  01/17/18 34.88 kg/m  01/05/18 33.64 kg/m  12/07/17 32.98 kg/m   Wt Readings from Last 4 Encounters:  03/03/18 264 lb (119.7 kg)  01/17/18 264 lb 6.4 oz (119.9 kg)  01/05/18 255 lb (115.7 kg)  12/07/17 250 lb (113.4 kg)

## 2018-03-08 LAB — TOXASSURE SELECT 13 (MW), URINE

## 2018-03-15 ENCOUNTER — Other Ambulatory Visit: Payer: Self-pay

## 2018-03-15 ENCOUNTER — Other Ambulatory Visit: Payer: Self-pay | Admitting: Family Medicine

## 2018-03-15 ENCOUNTER — Ambulatory Visit: Payer: Medicare Other | Attending: Pain Medicine | Admitting: Pain Medicine

## 2018-03-15 ENCOUNTER — Encounter: Payer: Self-pay | Admitting: Pain Medicine

## 2018-03-15 VITALS — BP 144/72 | HR 87 | Temp 97.1°F | Resp 18 | Ht 73.0 in | Wt 264.0 lb

## 2018-03-15 DIAGNOSIS — M79644 Pain in right finger(s): Secondary | ICD-10-CM | POA: Insufficient documentation

## 2018-03-15 DIAGNOSIS — Z96641 Presence of right artificial hip joint: Secondary | ICD-10-CM | POA: Insufficient documentation

## 2018-03-15 DIAGNOSIS — G8929 Other chronic pain: Secondary | ICD-10-CM | POA: Diagnosis not present

## 2018-03-15 DIAGNOSIS — M25551 Pain in right hip: Secondary | ICD-10-CM | POA: Diagnosis not present

## 2018-03-15 MED ORDER — ROPIVACAINE HCL 2 MG/ML IJ SOLN
2.0000 mL | Freq: Once | INTRAMUSCULAR | Status: AC
  Administered 2018-03-15: 10 mL
  Filled 2018-03-15: qty 10

## 2018-03-15 MED ORDER — METHYLPREDNISOLONE ACETATE 80 MG/ML IJ SUSP
80.0000 mg | Freq: Once | INTRAMUSCULAR | Status: AC
  Administered 2018-03-15: 80 mg
  Filled 2018-03-15: qty 1

## 2018-03-15 MED ORDER — LIDOCAINE HCL 2 % IJ SOLN
5.0000 mL | Freq: Once | INTRAMUSCULAR | Status: AC
  Administered 2018-03-15: 400 mg
  Filled 2018-03-15: qty 20

## 2018-03-15 NOTE — Progress Notes (Signed)
Safety precautions to be maintained throughout the outpatient stay will include: orient to surroundings, keep bed in low position, maintain call bell within reach at all times, provide assistance with transfer out of bed and ambulation.  

## 2018-03-15 NOTE — Telephone Encounter (Signed)
Controlled substance database reviewed. Sent to pharmacy.   

## 2018-03-15 NOTE — Patient Instructions (Signed)

## 2018-03-15 NOTE — Progress Notes (Signed)
Patient's Name: Alan Schmidt  MRN: [id number]  Referring Provider: [name], MD  DOB: 1945-10-10  PCP: [name], MD  DOS: 03/15/2018  Note by: Gaspar Cola, MD  Service setting: Ambulatory outpatient  Specialty: Interventional Pain Management  Patient type: Established  Location: [facility] (AMB) Pain Management Facility  Visit type: Interventional Procedure   Primary Reason for Visit: Interventional Pain Management Treatment. CC: Hand Pain (right thumb) and Hip Pain (right)  Procedure:          Anesthesia, Analgesia, Anxiolysis:  Type: Diagnostic Small Joint (CPT 20600) Steroid Injection  #1  Laterality: Right  Type: Local Anesthesia Indication(s): Analgesia         Local Anesthetic: Lidocaine 1-2% Route: Infiltration ([place]/IM) IV Access: Declined Sedation: Declined   Patient position: Sitting Extremity position: Thumb up.   Target Area: Posterolateral aspect Target: Thumb  interphalangeal joint  Approach:  Radial  approach.   Indications: 1. Thumb pain (Right)   2. 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 (year old), male patient, seen today for interventional treatment. He  has a past surgical history that includes Total hip arthroplasty; Tonsillectomy; and right hip surgery. Alan Schmidt has a current medication list which includes the following prescription(s): accu-chek fastclix lancets, accu-chek fastclix lancets, acetaminophen, alprazolam, amlodipine, aspirin ec, bd pen needle nano u/f, blood glucose meter kit and supplies, carvedilol, cyclobenzaprine, desoximetasone, escitalopram, glucose blood, humalog kwikpen, hydrochlorothiazide, hydroxyzine, jardiance, loratadine, magnesium, metformin, mometasone, naloxone, olmesartan, oxycodone hcl, oxycodone hcl, oxycodone hcl, pantoprazole, pravastatin, tamsulosin, tresiba flextouch, and  victoza. His primarily concern today is the Hand Pain (right thumb) and Hip Pain (right)  Initial Vital Signs:  Pulse/HCG Rate: 87  Temp: (!) 97.1 F (36.2 C) Resp: 18 BP: (!) 144/72 SpO2: 97 %  BMI: Estimated body mass index is 34.83 kg/m as calculated from the following:   Height as of this encounter: 6' 1"  (1.854 m).   Weight as of this encounter: 264 lb (119.7 kg).  Risk Assessment: Allergies: Reviewed. He is allergic to contrast media [iodinated diagnostic agents]; iodine; and shellfish allergy.  Allergy Precautions: None required Coagulopathies: Reviewed. None identified.  Blood-thinner therapy: None at this time Active Infection(s): Reviewed. None identified. Alan Schmidt is afebrile  Site Confirmation: Alan Schmidt was asked to confirm the procedure and laterality before marking the site Procedure checklist: Completed Consent: Before the procedure and under the influence of no sedative(s), amnesic(s), or anxiolytics, the patient was informed of the treatment options, risks and possible complications. To fulfill our ethical and legal obligations, as recommended by the American Medical Association's Code of Ethics, I have informed the patient of my clinical impression; the nature and purpose of the treatment or procedure; the risks, benefits, and possible complications of the intervention; the alternatives, including doing nothing; the risk(s) and benefit(s) of the alternative treatment(s) or procedure(s); and the risk(s) and benefit(s) of doing nothing. The patient was provided information about the general risks and possible complications associated with the procedure. These may include, but are not limited to: failure to achieve desired goals, infection, bleeding, organ or nerve damage, allergic reactions, paralysis, and death. In addition, the patient was informed of those risks and complications associated to the procedure, such as failure to decrease pain; infection; bleeding;  organ or nerve damage with subsequent damage to sensory, motor, and/or autonomic systems, resulting in permanent  pain, numbness, and/or weakness of one or several areas of the body; allergic reactions; (i.e.: anaphylactic reaction); and/or death. Furthermore, the patient was informed of those risks and complications associated with the medications. These include, but are not limited to: allergic reactions (i.e.: anaphylactic or anaphylactoid reaction(s)); adrenal axis suppression; blood sugar elevation that in diabetics may result in ketoacidosis or comma; water retention that in patients with history of congestive heart failure may result in shortness of breath, pulmonary edema, and decompensation with resultant heart failure; weight gain; swelling or edema; medication-induced neural toxicity; particulate matter embolism and blood vessel occlusion with resultant organ, and/or nervous system infarction; and/or aseptic necrosis of one or more joints. Finally, the patient was informed that Medicine is not an exact science; therefore, there is also the possibility of unforeseen or unpredictable risks and/or possible complications that may result in a catastrophic outcome. The patient indicated having understood very clearly. We have given the patient no guarantees and we have made no promises. Enough time was given to the patient to ask questions, all of which were answered to the patient's satisfaction. Alan Schmidt has indicated that he wanted to continue with the procedure. Attestation: I, the ordering provider, attest that I have discussed with the patient the benefits, risks, side-effects, alternatives, likelihood of achieving goals, and potential problems during recovery for the procedure that I have provided informed consent. Date  Time: 03/15/2018 12:59 PM  Pre-Procedure Preparation:  Monitoring: As per clinic protocol. Respiration, ETCO2, SpO2, BP, heart rate and rhythm monitor placed and checked for  adequate function Safety Precautions: Patient was assessed for positional comfort and pressure points before starting the procedure. Time-out: I initiated and conducted the "Time-out" before starting the procedure, as per protocol. The patient was asked to participate by confirming the accuracy of the "Time Out" information. Verification of the correct person, site, and procedure were performed and confirmed by me, the nursing staff, and the patient. "Time-out" conducted as per Joint Commission's Universal Protocol (UP.01.01.01). Time: 1325  Description of Procedure:          Area Prepped: Entire hand and wrist area Prepping solution: ChloraPrep (2% chlorhexidine gluconate and 70% isopropyl alcohol) Safety Precautions: Aspiration looking for blood return was conducted prior to all injections. At no point did we inject any substances, as a needle was being advanced. No attempts were made at seeking any paresthesias. Safe injection practices and needle disposal techniques used. Medications properly checked for expiration dates. SDV (single dose vial) medications used. Description of the Procedure: Protocol guidelines were followed. The patient was placed in position. The target area was identified and the area prepped in the usual manner. Skin & deeper tissues infiltrated with local anesthetic. Appropriate amount of time allowed to pass for local anesthetics to take effect. The procedure needles were then advanced to the target area. Proper needle placement secured. Negative aspiration confirmed. Solution injected in intermittent fashion, asking for systemic symptoms every 0.5cc of injectate. The needles were then removed and the area cleansed, making sure to leave some of the prepping solution back to take advantage of its long term bactericidal properties.             Vitals:   03/15/18 1258  BP: (!) 144/72  Pulse: 87  Resp: 18  Temp: (!) 97.1 F (36.2 C)  TempSrc: Oral  SpO2: 97%  Weight:  264 lb (119.7 kg)  Height: 6' 1"  (1.854 m)    Start Time: 1326 hrs. End Time: 1325 hrs. Materials:  Needle(s) Type: Spinal Needle Gauge: 22G Length: 3.5-in Medication(s): Please see orders for medications and dosing details.  Imaging Guidance:          Type of Imaging Technique: None used Indication(s): N/A Exposure Time: No patient exposure Contrast: None used. Fluoroscopic Guidance: N/A Ultrasound Guidance: N/A Interpretation: N/A  Antibiotic Prophylaxis:   Anti-infectives (From admission, onward)   None     Indication(s): None identified  Post-operative Assessment:  Post-procedure Vital Signs:  Pulse/HCG Rate: 87  Temp: (!) 97.1 F (36.2 C) Resp: 18 BP: (!) 144/72 SpO2: 97 %  EBL: None  Complications: No immediate post-treatment complications observed by team, or reported by patient.  Note: The patient tolerated the entire procedure well. A repeat set of vitals were taken after the procedure and the patient was kept under observation following institutional policy, for this type of procedure. Post-procedural neurological assessment was performed, showing return to baseline, prior to discharge. The patient was provided with post-procedure discharge instructions, including a section on how to identify potential problems. Should any problems arise concerning this procedure, the patient was given instructions to immediately contact us, at any time, without hesitation. In any case, we plan to contact the patient by telephone for a follow-up status report regarding this interventional procedure.  Comments:  No additional relevant information.  Plan of Care   Imaging Orders  No imaging studies ordered today    Procedure Orders     Small Joint Injection/Arthrocentesis  Medications ordered for procedure: Meds ordered this encounter  Medications  . methylPREDNISolone acetate (DEPO-MEDROL) injection 80 mg  . ropivacaine (PF) 2 mg/mL (0.2%) (NAROPIN) injection 2 mL    . lidocaine (XYLOCAINE) 2 % (with pres) injection 100 mg   Medications administered: We administered methylPREDNISolone acetate, ropivacaine (PF) 2 mg/mL (0.2%), and lidocaine.  See the medical record for exact dosing, route, and time of administration.  Disposition: Discharge home  Discharge Date & Time: 03/15/2018; 1335 hrs.   Physician-requested Follow-up: Return for post-procedure eval (2 wks), w/ Dionisio David, NP.  Future Appointments  Date Time Provider [place]  03/30/2018  1:30 PM Vevelyn Francois, NP [facility] None  04/26/2018  1:15 PM [name], MD [facility]  05/25/2018 10:45 AM Vevelyn Francois, NP [facility] None   Primary Care Physician: [name], MD Location: [facility] Outpatient [facility] Management Facility Note by: Gaspar Cola, MD Date: 03/15/2018; Time: 1:43 PM  Disclaimer:  Medicine is not an Chief Strategy Officer. The only guarantee in medicine is that nothing is guaranteed. It is important to note that the decision to proceed with this intervention was based on the information collected from the patient. The Data and conclusions were drawn from the patient's questionnaire, the interview, and the physical examination. Because the information was provided in large part by the patient, it cannot be guaranteed that it has not been purposely or unconsciously manipulated. Every effort has been made to obtain as much relevant data as possible for this evaluation. It is important to note that the conclusions that lead to this procedure are derived in large part from the available data. Always take into account that the treatment will also be dependent on availability of resources and existing treatment guidelines, considered by other Pain Management Practitioners as being common knowledge and practice, at the time of the intervention. For Medico-Legal purposes, it is also important to point out that variation in procedural techniques and pharmacological choices are  the acceptable norm. The indications, contraindications, technique, and results  of the above procedure should only be interpreted and judged by a Board-Certified Interventional Pain Specialist with extensive familiarity and expertise in the same exact procedure and technique.

## 2018-03-16 ENCOUNTER — Telehealth: Payer: Self-pay | Admitting: *Deleted

## 2018-03-16 NOTE — Telephone Encounter (Signed)
Attempted to call for post procedure follow-up. Message left. 

## 2018-03-23 ENCOUNTER — Other Ambulatory Visit: Payer: Self-pay | Admitting: Family Medicine

## 2018-03-23 ENCOUNTER — Telehealth: Payer: Self-pay

## 2018-03-23 MED ORDER — ACCU-CHEK FASTCLIX LANCETS MISC: 2 refills | Status: DC

## 2018-03-23 MED ORDER — GLUCOSE BLOOD VI STRP: ORAL_STRIP | 5 refills | Status: DC

## 2018-03-23 NOTE — Telephone Encounter (Signed)
LM to call us back

## 2018-03-23 NOTE — Telephone Encounter (Signed)
Copied from [place] [id number]. Topic: Quick Communication - Rx Refill/Question >> Mar 23, 2018 12:15 PM Margot Ables wrote: Medication: Accu-Chek Guide Test strips and lancets needed - pt is out of test strips - last checked blood sugar yesterday - pt notes pharmacy has been requesting since 03/18/2018 and no response from provider  Has the patient contacted their pharmacy? Yes - no reply per pharmacy Preferred Pharmacy (with phone number or street name): [id number] [address], [place] [phone] (Phone) [phone] (Fax)

## 2018-03-23 NOTE — Telephone Encounter (Signed)
He called in and said he is still having a lot of pain in the finger that he got a shot in last week. He wants to know if there is anything he can do to relieve the pain.

## 2018-03-26 ENCOUNTER — Emergency Department
Admission: EM | Admit: 2018-03-26 | Discharge: 2018-03-26 | Disposition: A | Discharge status: Home / Self Care | Payer: Medicare Other | Attending: Emergency Medicine | Admitting: Emergency Medicine

## 2018-03-26 ENCOUNTER — Encounter: Payer: Self-pay | Admitting: Emergency Medicine

## 2018-03-26 ENCOUNTER — Other Ambulatory Visit: Payer: Self-pay

## 2018-03-26 DIAGNOSIS — Z79899 Other long term (current) drug therapy: Secondary | ICD-10-CM | POA: Diagnosis not present

## 2018-03-26 DIAGNOSIS — E785 Hyperlipidemia, unspecified: Secondary | ICD-10-CM | POA: Insufficient documentation

## 2018-03-26 DIAGNOSIS — T383X1A Poisoning by insulin and oral hypoglycemic [antidiabetic] drugs, accidental (unintentional), initial encounter: Secondary | ICD-10-CM | POA: Diagnosis not present

## 2018-03-26 DIAGNOSIS — I10 Essential (primary) hypertension: Secondary | ICD-10-CM | POA: Diagnosis not present

## 2018-03-26 DIAGNOSIS — Z87891 Personal history of nicotine dependence: Secondary | ICD-10-CM | POA: Insufficient documentation

## 2018-03-26 LAB — URINALYSIS, COMPLETE (UACMP) WITH MICROSCOPIC
Bacteria, UA: NONE SEEN
Bilirubin Urine: NEGATIVE
Hgb urine dipstick: NEGATIVE
Ketones, ur: NEGATIVE mg/dL
Leukocytes,Ua: NEGATIVE
NITRITE: NEGATIVE
PROTEIN: NEGATIVE mg/dL
Specific Gravity, Urine: 1.021 (ref 1.005–1.030)
Squamous Epithelial / HPF: NONE SEEN (ref 0–5)
pH: 5 (ref 5.0–8.0)

## 2018-03-26 LAB — BASIC METABOLIC PANEL
Anion gap: 11 (ref 5–15)
BUN: 15 mg/dL (ref 8–23)
CALCIUM: 9.4 mg/dL (ref 8.9–10.3)
CO2: 28 mmol/L (ref 22–32)
Chloride: 100 mmol/L (ref 98–111)
Creatinine, Ser: 1 mg/dL (ref 0.61–1.24)
GFR calc Af Amer: >60 mL/min (ref >60)
GFR calc non Af Amer: >60 mL/min (ref >60)
Glucose, Bld: 128 mg/dL — ABNORMAL HIGH (ref 70–99)
Potassium: 3.6 mmol/L (ref 3.5–5.1)
SODIUM: 139 mmol/L (ref 135–145)

## 2018-03-26 LAB — GLUCOSE, CAPILLARY
GLUCOSE-CAPILLARY: 173 mg/dL — AB (ref 70–99)
Glucose-Capillary: 150 mg/dL — ABNORMAL HIGH (ref 70–99)
Glucose-Capillary: 152 mg/dL — ABNORMAL HIGH (ref 70–99)
Glucose-Capillary: 113 mg/dL — ABNORMAL HIGH (ref 70–99)
Glucose-Capillary: 140 mg/dL — ABNORMAL HIGH (ref 70–99)
Glucose-Capillary: 102 mg/dL — ABNORMAL HIGH (ref 70–99)
Glucose-Capillary: 102 mg/dL — ABNORMAL HIGH (ref 70–99)

## 2018-03-26 MED ORDER — OXYCODONE HCL 5 MG PO TABS
10.0000 mg | ORAL_TABLET | ORAL | Status: AC
  Administered 2018-03-26: 10 mg via ORAL
  Filled 2018-03-26: qty 2

## 2018-03-26 NOTE — ED Provider Notes (Signed)
[facility] Emergency Department Provider Note   ____________________________________________   First MD Initiated Contact with Patient 03/26/18 1825     (approximate)  I have reviewed the triage vital signs and the nursing notes.   HISTORY  Chief Complaint Drug Overdose    HPI Alan Schmidt is a 73 y.o. male here for evaluation after accidentally injecting himself with 44 units of fast acting insulin  Patient reports he made a mistake and accidentally gave himself 44 units of short acting insulin when he meant to give his long-acting.  He has been feeling well.  His blood sugars have been okay, he reports it was around 170 or so at the house and come down some here.  He is able to eat and drink.  No nausea vomiting.  No headaches no sweating.  Reports it was an accident, is well aware of how to dose his insulin just made a mistake today.  He called poison control who told him to come here for evaluation.  Denies any desire to harm himself or anyone else.   Past Medical History:  Diagnosis Date  . Acute postoperative pain 11/24/2016  . Anxiety   . Chronic hip pain (Right) 12/05/2014  . Chronic lumbar pain   . Depression   . Hyperlipidemia   . Hypertension   . Kidney stones   . Migraines     Patient Active Problem List   Diagnosis Date Noted  . 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 Thumb pain (Right) 03/03/2018  . Lesion of skin of foot 01/17/2018  . Chronic pain of both knees 05/18/2017  . Coccygodynia 04/19/2017  . DDD (degenerative disc disease), lumbar 04/19/2017  . Chronic musculoskeletal pain 04/19/2017  . Chronic headache 03/16/2017  . Chronic tension-type headache, intractable 02/24/2017  . Vertigo 02/24/2017  . Lumbar facet joint osteoarthritis (Bilateral) 02/16/2017  . History of allergy to radiographic contrast media 02/16/2017  . Hypotension during surgery 02/16/2017  . Nevus 12/10/2016   . History of postoperative nausea and vomiting 11/24/2016  . Encounter for general adult medical examination with abnormal findings 09/25/2016  . Opioid-induced constipation (OIC) 05/14/2016  . Chronic pain syndrome 02/13/2016  . Chronic shoulder radicular pain (Left) 12/18/2015  . Chronic cervical radicular pain (Left) 12/18/2015  . Disturbance of skin sensation 11/13/2015  . Anxiety and depression 07/17/2015  . Allergic rhinitis 05/22/2015  . History of Vasovagal response to spinal injections 04/30/2015    Class: History of  . BPH (benign prostatic hyperplasia) 04/17/2015  . Diabetes ([place]) 03/19/2015  . Chronic shoulder pain (Bilateral) (R>L) 03/06/2015  . Occipital neuralgia (Left) 03/06/2015  . Cervicogenic headache (Left) 03/06/2015  . Chronic low back pain (Primary Source of Pain) (Bilateral) (R>L) 12/05/2014  . Lumbar facet syndrome (Bilateral) (R>L) 12/05/2014  . Lumbar spondylosis 12/05/2014  . Diabetic peripheral neuropathy ([place]) 12/05/2014  . Long term current use of opiate analgesic 12/05/2014  . Long term prescription opiate use 12/05/2014  . Opiate use (60 MME/Day) 12/05/2014  . Opiate dependence ([place]) 12/05/2014  . Encounter for therapeutic drug level monitoring 12/05/2014  . Chronic neck pain (midline over the C7 spinous processes) (L>R) 12/05/2014  . Neurogenic pain 12/05/2014  . Neuropathic pain 12/05/2014  . Contrast dye allergy 12/05/2014  . Chronic lower extremity pain (Secondary source of pain) (Bilateral) (R>L) 12/05/2014  . 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) (Right L5 dermatome) 12/05/2014  . 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  Chronic hip pain (Right) 12/05/2014  . Class I Morbid obesity ([place]) (68% higher incidence of chronic low back pain) 12/05/2014  . Essential hypertension 12/05/2014  . GERD (gastroesophageal reflux disease) 12/05/2014  . Obstructive sleep apnea 12/05/2014  . Hyperlipidemia 12/05/2014  . Chronic  kidney disease (CKD) 12/05/2014  . Abnormal nerve conduction studies (severe bilateral lower extremity polyneuropathy) 12/05/2014  . Paraparesis ([place]) 05/02/2014    Past Surgical History:  Procedure Laterality Date  . right hip surgery     4 surgeries  . TONSILLECTOMY    . TOTAL HIP ARTHROPLASTY      Prior to Admission medications   Medication Sig Start Date End Date Taking? Authorizing Provider  ACCU-CHEK FASTCLIX LANCETS MISC CHECK 4 TIMES A DAY 03/23/18   [name], MD  acetaminophen (TYLENOL) 500 MG tablet Take 500 mg by mouth every 6 (six) hours as needed.    [provider]  ALPRAZolam (XANAX) 1 MG tablet TAKE 0.5 TABLETS (0.5 MG TOTAL) BY MOUTH 2 (TWO) TIMES DAILY AS NEEDED FOR ANXIETY. 03/15/18   [name], MD  amLODipine (NORVASC) 5 MG tablet TAKE 1 TABLET BY MOUTH EVERY DAY 02/07/18   [name], MD  aspirin EC 81 MG tablet Take 81 mg by mouth daily.    [provider]  BD PEN NEEDLE NANO U/F 32G X 4 MM MISC USE EVERY DAY 10/10/15   [name], MD  blood glucose meter kit and supplies KIT Accu-Chek guide meter and Accu-Chek guide test strips with Accu-Chek fast clix lancets, check blood sugars 4 times daily, ICD 10 code E11.9 06/08/17   [name], MD  carvedilol (COREG) 25 MG tablet TAKE 1 TABLET BY MOUTH TWICE A DAY 12/22/17   [name], MD  cyclobenzaprine (FLEXERIL) 10 MG tablet Take 1 tablet (10 mg total) by mouth 3 (three) times daily as needed for muscle spasms. Must last 30 days. 03/07/18 06/05/18  Vevelyn Francois, NP  desoximetasone (TOPICORT) 0.25 % cream APPLY CREAM TO AFFECTED AREA TWO TIMES DAILY 02/22/15   [provider]  escitalopram (LEXAPRO) 10 MG tablet TAKE 1 TABLET (10 MG TOTAL) BY MOUTH DAILY. START THE DAY AFTER STOPPING PROZAC. 02/28/18   [name], MD  glucose blood (ACCU-CHEK GUIDE) test strip Use as instructed 03/23/18   [name], MD  HUMALOG KWIKPEN 100 UNIT/ML KwikPen  INJECT 0.12-0.18 MLS (12-18 UNITS TOTAL) INTO THE SKIN 2 (TWO) TIMES DAILY WITH A MEAL. 01/13/18   [name], MD  hydrochlorothiazide (HYDRODIURIL) 25 MG tablet TAKE 1 TABLET BY MOUTH EVERY DAY 12/22/17   [name], MD  hydrOXYzine (ATARAX/VISTARIL) 50 MG tablet TAKE 1 TABLET BY MOUTH FOUR TIMES A DAY AS NEEDED 07/17/15   [name], MD  JARDIANCE 25 MG TABS tablet TAKE 25 MG BY MOUTH DAILY. 02/28/18   [name], MD  loratadine (CLARITIN) 10 MG tablet Take 1 tablet (10 mg total) by mouth daily. 09/07/16   [name], MD  magnesium 30 MG tablet Take 30 mg by mouth 2 (two) times daily.    [provider]  metFORMIN (GLUCOPHAGE) 1000 MG tablet TAKE 1 TABLET (1,000 MG TOTAL) BY MOUTH 2 (TWO) TIMES DAILY WITH A MEAL. 01/14/18   [name], MD  mometasone (NASONEX) 50 MCG/ACT nasal spray PLACE 2 SPRAYS INTO THE NOSE DAILY. 10/27/17   [name], MD  naloxone [facility]) 2 MG/2ML injection Inject content of syringe into  thigh muscle. Call 911. 05/29/15   Milinda Pointer, MD  olmesartan (BENICAR) 40 MG tablet TAKE 1 TABLET BY MOUTH EVERY DAY 02/28/18   [name], MD  Oxycodone HCl 10 MG TABS Take 1 tablet (10 mg total) by mouth every 6 (six) hours as needed for up to 30 days. 05/17/18 06/16/18  Vevelyn Francois, NP  Oxycodone HCl 10 MG TABS Take 1 tablet (10 mg total) by mouth every 6 (six) hours as needed for up to 30 days. 04/17/18 05/17/18  Vevelyn Francois, NP  Oxycodone HCl 10 MG TABS Take 1 tablet (10 mg total) by mouth every 6 (six) hours as needed for up to 30 days. 03/18/18 04/17/18  Vevelyn Francois, NP  pantoprazole (PROTONIX) 40 MG tablet TAKE 1 TABLET BY MOUTH EVERY DAY 09/10/17   [name], MD  pravastatin (PRAVACHOL) 40 MG tablet TAKE 1 TABLET BY MOUTH EVERY DAY 02/28/18   [name], MD  tamsulosin (FLOMAX) 0.4 MG CAPS capsule TAKE 1 CAPSULE (0.4 MG TOTAL) BY MOUTH 2 (TWO) TIMES DAILY. 02/07/18   [name], MD    TRESIBA FLEXTOUCH 100 UNIT/ML SOPN FlexTouch Pen INJECT 40 UNITS INTO THE SKIN DAILY AT 10 PM. 12/27/17   [name], MD  VICTOZA 18 MG/3ML SOPN INJECT 0.3 MLS (1.8 MG TOTAL) INTO THE SKIN DAILY. 05/25/17   [name], MD    Allergies Contrast media [iodinated diagnostic agents]; Iodine; and Shellfish allergy  Family History  Problem Relation Age of Onset  . Cancer Mother   . Heart disease Father   . Stroke Father     Social History Social History   Tobacco Use  . Smoking status: Former Research scientist (life sciences)  . Smokeless tobacco: Never Used  Substance Use Topics  . Alcohol use: No    Alcohol/week: 0.0 standard drinks  . Drug use: No    Review of Systems Constitutional: No fever/chills Eyes: No visual changes. ENT: No sore throat. Cardiovascular: Denies chest pain. Respiratory: Denies shortness of breath. Gastrointestinal: No abdominal pain.   Genitourinary: Negative for dysuria. Musculoskeletal: Negative for back pain. Skin: Negative for rash. Neurological: Negative for headaches, areas of focal weakness or numbness.    ____________________________________________   PHYSICAL EXAM:  VITAL SIGNS: ED Triage Vitals  Enc Vitals Group     BP 03/26/18 1743 (!) 158/99     Pulse Rate 03/26/18 1743 (!) 105     Resp 03/26/18 1743 16     Temp 03/26/18 1743 97.9 F (36.6 C)     Temp Source 03/26/18 1743 Oral     SpO2 03/26/18 1743 97 %     Weight 03/26/18 1745 250 lb (113.4 kg)     Height 03/26/18 1745 6' 1" (1.854 m)     Head Circumference --      Peak Flow --      Pain Score 03/26/18 1753 0     Pain Loc --      Pain Edu? --      Excl. in [place]? --     Constitutional: Alert and oriented. Well appearing and in no acute distress. Eyes: Conjunctivae are normal. Head: Atraumatic. Nose: No congestion/rhinnorhea. Mouth/Throat: Mucous membranes are moist. Neck: No stridor.  Cardiovascular: Normal rate, regular rhythm. Grossly normal heart sounds.  Good peripheral  circulation. Respiratory: Normal respiratory effort.  No retractions. Lungs CTAB. Gastrointestinal: Soft and nontender. No distention. Musculoskeletal: No lower extremity tenderness nor edema. Neurologic:  Normal speech and language. No gross  focal neurologic deficits are appreciated.  Skin:  Skin is warm, dry and intact. No rash noted. Psychiatric: Mood and affect are normal. Speech and behavior are normal.  ____________________________________________   LABS (all labs ordered are listed, but only abnormal results are displayed)  Labs Reviewed  GLUCOSE, CAPILLARY - Abnormal; Notable for the following components:      Result Value   Glucose-Capillary 150 (*)    All other components within normal limits  BASIC METABOLIC PANEL - Abnormal; Notable for the following components:   Glucose, Bld 128 (*)    All other components within normal limits  GLUCOSE, CAPILLARY - Abnormal; Notable for the following components:   Glucose-Capillary 152 (*)    All other components within normal limits  URINALYSIS, COMPLETE (UACMP) WITH MICROSCOPIC - Abnormal; Notable for the following components:   Color, Urine YELLOW (*)    APPearance CLEAR (*)    Glucose, UA >=500 (*)    All other components within normal limits  GLUCOSE, CAPILLARY - Abnormal; Notable for the following components:   Glucose-Capillary 173 (*)    All other components within normal limits  GLUCOSE, CAPILLARY - Abnormal; Notable for the following components:   Glucose-Capillary 113 (*)    All other components within normal limits  GLUCOSE, CAPILLARY - Abnormal; Notable for the following components:   Glucose-Capillary 140 (*)    All other components within normal limits  GLUCOSE, CAPILLARY - Abnormal; Notable for the following components:   Glucose-Capillary 102 (*)    All other components within normal limits  GLUCOSE, CAPILLARY - Abnormal; Notable for the following components:   Glucose-Capillary 102 (*)    All other components  within normal limits  CBG MONITORING, ED  CBG MONITORING, ED  CBG MONITORING, ED  CBG MONITORING, ED  CBG MONITORING, ED  CBG MONITORING, ED  CBG MONITORING, ED  CBG MONITORING, ED  CBG MONITORING, ED  CBG MONITORING, ED  CBG MONITORING, ED  CBG MONITORING, ED   ____________________________________________  EKG   ____________________________________________  RADIOLOGY   ____________________________________________   PROCEDURES  Procedure(s) performed: None  Procedures  Critical Care performed: No  ____________________________________________   INITIAL IMPRESSION / ASSESSMENT AND PLAN / ED COURSE  Pertinent labs & imaging results that were available during my care of the patient were reviewed by me and considered in my medical decision making (see chart for details).   Accidental injection of fast acting insulin.  Patient blood sugars will be monitored carefully, IV access in place in the event of need for dextrose solution.  Patient is eating and drinking well without any symptoms of hypoglycemia.  This was accidental.  Poison control recommends observation for 5 hours until approximately 10 PM.  Continue to monitor his blood sugar carefully.  Clinical Course as of Mar 26 2329  Sat Mar 26, 2018  2016 Patient resting comfortably.  He has been eating his blood sugars have been in good range without hypoglycemia.  Continue to observe for several hours anticipate till about 11 PM.  He is eating and drinking well.  He does have a prescription chronically for back discomfort on oxycodone, will give him this here as he requested and is verified.  He is fully alert appears to be doing well.   [MQ]    Clinical Course User Index [MQ] Delman Kitten, MD  ----------------------------------------- 8:43 PM on 03/26/2018 -----------------------------------------    BMP reviewed negative for acute concern.  Patient resting comfortably continue to do well without  complaint.  -----------------------------------------  11:28 PM on 03/26/2018 -----------------------------------------  Patient observed in the ER for several hours without any hypoglycemia.  Euglycemia, blood glucose 102.  Discussed with the patient further observation versus discharge and he reports that he would like to go home.  He is demonstrating stability his blood glucose now for several hours, I am comfortable with this plan.  Discussed signs and symptoms of hypoglycemia and checking his blood glucose prior to bed, eating a meal tonight for which she reports he has ribs at home from [place] that he is going to eat, and also setting alarm to check at 2 AM.  Return precautions and treatment recommendations and follow-up discussed with the patient who is agreeable with the plan.   ____________________________________________   FINAL CLINICAL IMPRESSION(S) / ED DIAGNOSES  Final diagnoses:  Insulin overdose, accidental or unintentional, initial encounter        Note:  This document was prepared using Dragon voice recognition software and may include unintentional dictation errors       Delman Kitten, MD 03/26/18 2332

## 2018-03-26 NOTE — ED Triage Notes (Signed)
First Nurse Note:  At 1700, patient accidentally injected 44 units of Humolog instead of long acting insulin agent.  Per Suezanne Jacquet (Pharmasist at [employer]), Patient was recommended to come to ED for: *  5 hours of OBS (end at 2200) *  Monitor CBG 1-2 times per hour *  IV Dextrose or eating as needed. * If patient requires any intervention for hypoglycemia, recommend an additional 6 hours of monitor from time of intervention. *  If patient becomes profoundly hypoglycemic, check a K.

## 2018-03-26 NOTE — ED Triage Notes (Signed)
Pt to ER states that he took humalog instead of [place].  States he thinks he took 44units at approximately 70.  States had eaten a biscuit prior to that and has had sweet lemonade with added sugar and glucose tablets since then.  PT is beginning to feel like BS is dropping.

## 2018-03-26 NOTE — ED Notes (Signed)
Pt verbally understands the importance of checking blood sugar when he gets home and coming back if his levels drop too low.

## 2018-03-26 NOTE — Discharge Instructions (Signed)
Please continue to check your blood sugar regularly throughout the evening.  Set alarm for 2 AM to check your blood sugar as well.  Please have a meal before dinner, if you do have a low blood sugar please take 1 of your glucose tablets and a beverage or meal containing sugar.  Return the emergency room right away if you should have an episode of low blood sugar, develop confusion, weakness, sweats, chest pain dizziness or other concerns arise.  Please resume your normal diabetes schedule of medications tomorrow if your blood sugars remain normal throughout the evening.

## 2018-03-30 ENCOUNTER — Ambulatory Visit: Payer: Medicare Other | Admitting: Nurse Practitioner

## 2018-03-30 DIAGNOSIS — B351 Tinea unguium: Secondary | ICD-10-CM | POA: Diagnosis not present

## 2018-03-30 DIAGNOSIS — Z794 Long term (current) use of insulin: Secondary | ICD-10-CM | POA: Diagnosis not present

## 2018-03-30 DIAGNOSIS — L851 Acquired keratosis [keratoderma] palmaris et plantaris: Secondary | ICD-10-CM | POA: Diagnosis not present

## 2018-03-30 DIAGNOSIS — E1140 Type 2 diabetes mellitus with diabetic neuropathy, unspecified: Secondary | ICD-10-CM | POA: Diagnosis not present

## 2018-04-15 ENCOUNTER — Other Ambulatory Visit: Payer: Self-pay | Admitting: Family Medicine

## 2018-04-15 DIAGNOSIS — Z794 Long term (current) use of insulin: Principal | ICD-10-CM

## 2018-04-15 DIAGNOSIS — E1142 Type 2 diabetes mellitus with diabetic polyneuropathy: Secondary | ICD-10-CM

## 2018-04-15 MED ORDER — LIRAGLUTIDE 18 MG/3ML ~~LOC~~ SOPN: 1.8000 mg | PEN_INJECTOR | Freq: Every day | SUBCUTANEOUS | 1 refills | Status: DC

## 2018-04-15 MED ORDER — INSULIN DEGLUDEC 100 UNIT/ML ~~LOC~~ SOPN: PEN_INJECTOR | SUBCUTANEOUS | 3 refills | Status: DC

## 2018-04-15 MED ORDER — INSULIN LISPRO (1 UNIT DIAL) 100 UNIT/ML (KWIKPEN): PEN_INJECTOR | SUBCUTANEOUS | 2 refills | Status: DC

## 2018-04-15 NOTE — Telephone Encounter (Signed)
Requested medication (s) are due for refill today: yes  Requested medication (s) are on the active medication list: historical medicatiom  Last refill:  03/06/15  Future visit scheduled: yes  Notes to clinic:  Historical medication and provider   Requested Prescriptions  Pending Prescriptions Disp Refills   desoximetasone (TOPICORT) 0.25 % cream 30 g     Sig: [place]     Dermatology:  Corticosteroids Passed - 04/15/2018 11:41 AM      Passed - Valid encounter within last 12 months    Recent Outpatient Visits          2 months ago Type 2 diabetes mellitus with diabetic polyneuropathy, with long-term current use of insulin ([place])   [place], [name], MD   6 months ago Encounter for general adult medical examination with abnormal findings   [name] [name], MD   1 year ago Dysphagia, unspecified type   [facility] [place], [name], MD   1 year ago Type 2 diabetes mellitus with diabetic polyneuropathy, with long-term current use of insulin [facility])   [place], [name], MD   1 year ago Type 2 diabetes mellitus with diabetic polyneuropathy, with long-term current use of insulin [facility])   [place], [place], [place]      Future Appointments            In 1 week [name], [name], MD [place], [facility]         Signed Prescriptions Disp Refills   liraglutide (VICTOZA) 18 MG/3ML SOPN 27 pen 1    Sig: Inject 0.3 mLs (1.8 mg total) into the skin daily.     Endocrinology:  Diabetes - GLP-1 Receptor Agonists Passed - 04/15/2018 11:41 AM      Passed - HBA1C is between 0 and 7.9 and within 180 days    Hemoglobin A1C  Date Value Ref Range Status  01/17/2018 7.6 (A) 4.0 - 5.6 % Final   Hgb A1c MFr Bld  Date Value Ref Range Status  09/27/2017 8.3 (H) 4.6 - 6.5 % Final    Comment:   Glycemic Control Guidelines for People with Diabetes:Non Diabetic:  <6%Goal of Therapy: <7%Additional Action Suggested:  >8%          Passed - Valid encounter within last 6 months    Recent Outpatient Visits          2 months ago Type 2 diabetes mellitus with diabetic polyneuropathy, with long-term current use of insulin ([place])   [place], [name], MD   6 months ago Encounter for general adult medical examination with abnormal findings   [facility] [name], MD   1 year ago Dysphagia, unspecified type   [facility] [place], [name], MD   1 year ago Type 2 diabetes mellitus with diabetic polyneuropathy, with long-term current use of insulin [facility])   [place], [name], MD   1 year ago Type 2 diabetes mellitus with diabetic polyneuropathy, with long-term current use of insulin [facility])   [place], [place], [place]      Future Appointments            In 1 week [name], [name], MD [facility], [facility]          insulin  degludec (TRESIBA FLEXTOUCH) 100 UNIT/ML SOPN FlexTouch Pen 15 pen 3    Sig: INJECT 40 UNITS INTO THE SKIN DAILY AT 10 PM.     Endocrinology:  Diabetes - Insulins Passed - 04/15/2018 11:41 AM      Passed - HBA1C is between 0 and 7.9 and within 180 days    Hemoglobin A1C  Date Value Ref Range Status  01/17/2018 7.6 (A) 4.0 - 5.6 % Final   Hgb A1c MFr Bld  Date Value Ref Range Status  09/27/2017 8.3 (H) 4.6 - 6.5 % Final    Comment:    Glycemic Control Guidelines for People with Diabetes:Non Diabetic:  <6%Goal of Therapy: <7%Additional Action Suggested:  >8%          Passed - Valid encounter within last 6 months    Recent Outpatient Visits          2 months ago Type 2 diabetes mellitus with diabetic polyneuropathy, with long-term current use of insulin ([place])   [place],  [name], MD   6 months ago Encounter for general adult medical examination with abnormal findings   [place], [name], MD   1 year ago Dysphagia, unspecified type   [facility] [place], [name], MD   1 year ago Type 2 diabetes mellitus with diabetic polyneuropathy, with long-term current use of insulin [facility])   [place], [name], MD   1 year ago Type 2 diabetes mellitus with diabetic polyneuropathy, with long-term current use of insulin [facility])   [place], [place], [place]      Future Appointments            In 1 week [name], MD [place], [facility]          insulin lispro (HUMALOG KWIKPEN) 100 UNIT/ML KwikPen 15 pen 2    Sig: INJECT 0.12-0.18 MLS (12-18 UNITS TOTAL) INTO THE SKIN 2 (TWO) TIMES DAILY WITH A MEAL.     There is no refill protocol information for this order

## 2018-04-15 NOTE — Telephone Encounter (Signed)
Requested medication (s) are due for refill today: yes  Requested medication (s) are on the active medication list: yes  Last refill:  02/22/15  Future visit scheduled: yes  Notes to clinic:  Prescribed by historical provider and last prescribed in 2017  Requested Prescriptions  Pending Prescriptions Disp Refills   desoximetasone (TOPICORT) 0.25 % cream 30 g     Sig: [place]     Dermatology:  Corticosteroids Passed - 04/15/2018 11:41 AM      Passed - Valid encounter within last 12 months    Recent Outpatient Visits          2 months ago Type 2 diabetes mellitus with diabetic polyneuropathy, with long-term current use of insulin ([place])   [place], [name], MD   6 months ago Encounter for general adult medical examination with abnormal findings   [facility] [name], MD   1 year ago Dysphagia, unspecified type   [facility] [place], [name], MD   1 year ago Type 2 diabetes mellitus with diabetic polyneuropathy, with long-term current use of insulin [facility])   [place], [name], MD   1 year ago Type 2 diabetes mellitus with diabetic polyneuropathy, with long-term current use of insulin [facility])   [place], [place], [place]      Future Appointments            In 1 week [name], [name], MD [place], [facility]         Signed Prescriptions Disp Refills   liraglutide (VICTOZA) 18 MG/3ML SOPN 27 pen 1    Sig: Inject 0.3 mLs (1.8 mg total) into the skin daily.     Endocrinology:  Diabetes - GLP-1 Receptor Agonists Passed - 04/15/2018 11:41 AM      Passed - HBA1C is between 0 and 7.9 and within 180 days    Hemoglobin A1C  Date Value Ref Range Status  01/17/2018 7.6 (A) 4.0 - 5.6 % Final   Hgb A1c MFr Bld  Date Value Ref Range Status  09/27/2017 8.3 (H) 4.6 - 6.5 % Final    Comment:     Glycemic Control Guidelines for People with Diabetes:Non Diabetic:  <6%Goal of Therapy: <7%Additional Action Suggested:  >8%          Passed - Valid encounter within last 6 months    Recent Outpatient Visits          2 months ago Type 2 diabetes mellitus with diabetic polyneuropathy, with long-term current use of insulin ([place])   [place], [name], MD   6 months ago Encounter for general adult medical examination with abnormal findings   [facility] [name], MD   1 year ago Dysphagia, unspecified type   [facility] [place], [name], MD   1 year ago Type 2 diabetes mellitus with diabetic polyneuropathy, with long-term current use of insulin [facility])   [place], [name], MD   1 year ago Type 2 diabetes mellitus with diabetic polyneuropathy, with long-term current use of insulin [facility])   [place], [place], [place]      Future Appointments            In 1 week [name], [name], MD [facility], [facility]  insulin degludec (TRESIBA FLEXTOUCH) 100 UNIT/ML SOPN FlexTouch Pen 15 pen 3    Sig: INJECT 40 UNITS INTO THE SKIN DAILY AT 10 PM.     Endocrinology:  Diabetes - Insulins Passed - 04/15/2018 11:41 AM      Passed - HBA1C is between 0 and 7.9 and within 180 days    Hemoglobin A1C  Date Value Ref Range Status  01/17/2018 7.6 (A) 4.0 - 5.6 % Final   Hgb A1c MFr Bld  Date Value Ref Range Status  09/27/2017 8.3 (H) 4.6 - 6.5 % Final    Comment:    Glycemic Control Guidelines for People with Diabetes:Non Diabetic:  <6%Goal of Therapy: <7%Additional Action Suggested:  >8%          Passed - Valid encounter within last 6 months    Recent Outpatient Visits          2 months ago Type 2 diabetes mellitus with diabetic polyneuropathy, with long-term current use of insulin ([place])   [place], [name], MD   6 months ago Encounter for general adult medical examination with abnormal findings   [facility] [place], [name], MD   1 year ago Dysphagia, unspecified type   [facility] [place], [name], MD   1 year ago Type 2 diabetes mellitus with diabetic polyneuropathy, with long-term current use of insulin [facility])   [facility] [place], [name], MD   1 year ago Type 2 diabetes mellitus with diabetic polyneuropathy, with long-term current use of insulin [facility])   [place], [place], [place]      Future Appointments            In 1 week [name], MD [place], [facility]          insulin lispro (HUMALOG KWIKPEN) 100 UNIT/ML KwikPen 15 pen 2    Sig: INJECT 0.12-0.18 MLS (12-18 UNITS TOTAL) INTO THE SKIN 2 (TWO) TIMES DAILY WITH A MEAL.     There is no refill protocol information for this order

## 2018-04-15 NOTE — Telephone Encounter (Signed)
Copied from [place]. Topic: Quick Communication - Rx Refill/Question >> Apr 15, 2018 11:29 AM Sheppard Coil, [employer] L wrote: Medication:  VICTOZA 18 MG/3ML SOPN HUMALOG KWIKPEN 100 UNIT/ML KwikPen TRESIBA FLEXTOUCH 100 UNIT/ML SOPN FlexTouch Pen desoximetasone (TOPICORT) 0.25 % cream  Has the patient contacted their pharmacy? Yes - states he needs to contact office (Agent: If no, request that the patient contact the pharmacy for the refill.) (Agent: If yes, when and what did the pharmacy advise?)  Preferred Pharmacy (with phone number or street name): [id number] [address], [place] [phone] (Phone) [phone] (Fax)  Agent: Please be advised that RX refills may take up to 3 business days. We ask that you follow-up with your pharmacy.

## 2018-04-19 ENCOUNTER — Other Ambulatory Visit: Payer: Self-pay | Admitting: Family Medicine

## 2018-04-20 ENCOUNTER — Other Ambulatory Visit: Payer: Self-pay

## 2018-04-20 ENCOUNTER — Telehealth: Payer: Self-pay | Admitting: Family Medicine

## 2018-04-20 MED ORDER — LORATADINE 10 MG PO TABS: 10.0000 mg | ORAL_TABLET | Freq: Every day | ORAL | 11 refills | Status: DC

## 2018-04-20 MED ORDER — MAGNESIUM 30 MG PO TABS: 30.0000 mg | ORAL_TABLET | Freq: Two times a day (BID) | ORAL | 3 refills | Status: DC

## 2018-04-20 NOTE — Telephone Encounter (Signed)
Pt calling back.  He can't remember who RXd this cream in the past but it is used to help when he is having a bad exemia flare.  Pt states that he is having that now and needs the cream for that.

## 2018-04-20 NOTE — Telephone Encounter (Signed)
Rx has been sent into pt's pharmacy.  

## 2018-04-20 NOTE — Telephone Encounter (Signed)
Copied from [place] [id number]. Topic: Quick Communication - Rx Refill/Question >> Apr 20, 2018 12:34 PM Sheppard Coil, [employer] L wrote: Medication:  loratadine (CLARITIN) 10 MG tablet  magnesium 30 MG tablet  Has the patient contacted their pharmacy? Yes - needs refill (Agent: If no, request that the patient contact the pharmacy for the refill.) (Agent: If yes, when and what did the pharmacy advise?)  Preferred Pharmacy (with phone number or street name): [id number] [address], [place] [phone] (Phone) [phone] (Fax)  Agent: Please be advised that RX refills may take up to 3 business days. We ask that you follow-up with your pharmacy.

## 2018-04-20 NOTE — Telephone Encounter (Signed)
Last OV 01/17/2018   Last refilled 02/22/2015   Sent appt 04/26/2018  Call pt who Rx this prescription in the past what is it for?

## 2018-04-20 NOTE — Telephone Encounter (Signed)
Called pt and left a VM to call me back in regards to this refill request

## 2018-04-21 MED ORDER — DESOXIMETASONE 0.25 % EX CREA: TOPICAL_CREAM | CUTANEOUS | 0 refills | Status: DC

## 2018-04-26 ENCOUNTER — Ambulatory Visit: Payer: Medicare Other | Admitting: Family Medicine

## 2018-05-03 ENCOUNTER — Ambulatory Visit (INDEPENDENT_AMBULATORY_CARE_PROVIDER_SITE_OTHER): Payer: Medicare Other | Admitting: Family Medicine

## 2018-05-03 ENCOUNTER — Telehealth: Payer: Self-pay | Admitting: Family Medicine

## 2018-05-03 ENCOUNTER — Ambulatory Visit (INDEPENDENT_AMBULATORY_CARE_PROVIDER_SITE_OTHER): Payer: Medicare Other

## 2018-05-03 ENCOUNTER — Encounter: Payer: Self-pay | Admitting: Family Medicine

## 2018-05-03 ENCOUNTER — Other Ambulatory Visit: Payer: Self-pay

## 2018-05-03 VITALS — BP 148/80 | HR 103 | Ht 73.0 in

## 2018-05-03 DIAGNOSIS — N401 Enlarged prostate with lower urinary tract symptoms: Secondary | ICD-10-CM | POA: Diagnosis not present

## 2018-05-03 DIAGNOSIS — Z794 Long term (current) use of insulin: Secondary | ICD-10-CM | POA: Diagnosis not present

## 2018-05-03 DIAGNOSIS — E1142 Type 2 diabetes mellitus with diabetic polyneuropathy: Secondary | ICD-10-CM | POA: Diagnosis not present

## 2018-05-03 DIAGNOSIS — M25551 Pain in right hip: Secondary | ICD-10-CM

## 2018-05-03 DIAGNOSIS — Z96641 Presence of right artificial hip joint: Secondary | ICD-10-CM | POA: Diagnosis not present

## 2018-05-03 DIAGNOSIS — I10 Essential (primary) hypertension: Secondary | ICD-10-CM

## 2018-05-03 DIAGNOSIS — R3914 Feeling of incomplete bladder emptying: Secondary | ICD-10-CM

## 2018-05-03 LAB — BASIC METABOLIC PANEL
BUN: 12 mg/dL (ref 6–23)
CO2: 30 mEq/L (ref 19–32)
Calcium: 9.4 mg/dL (ref 8.4–10.5)
Chloride: 98 mEq/L (ref 96–112)
Creatinine, Ser: 1.15 mg/dL (ref 0.40–1.50)
GFR: 75.59 mL/min (ref >60.00)
Glucose, Bld: 145 mg/dL — ABNORMAL HIGH (ref 70–99)
POTASSIUM: 4.3 meq/L (ref 3.5–5.1)
Sodium: 136 mEq/L (ref 135–145)

## 2018-05-03 LAB — HEMOGLOBIN A1C: Hgb A1c MFr Bld: 8.2 % — ABNORMAL HIGH (ref 4.6–6.5)

## 2018-05-03 MED ORDER — IBUPROFEN 600 MG PO TABS: 600.0000 mg | ORAL_TABLET | Freq: Three times a day (TID) | ORAL | 0 refills | Status: DC | PRN

## 2018-05-03 NOTE — Telephone Encounter (Signed)
Please let the patient know that after reviewing his cyclobenzaprine it appears that his pain specialist prescribes this.  He should contact them to see if they would be willing to change it to 1 of the preferred options such as baclofen.  Thanks.

## 2018-05-03 NOTE — Assessment & Plan Note (Signed)
Mildly elevated today.  I suspect this is related to his discomfort.  We will plan to recheck his blood pressure at his next visit.

## 2018-05-03 NOTE — Patient Instructions (Signed)
Nice to see you. We will get an x-ray of your hip. Please try the ibuprofen that I sent to your pharmacy.  You need to take this with food.  If it upsets your stomach please let us know.  Please try this for the next several days and if it is not improving let us know.  If you require this for longer than 5 days please let us know. We will get lab work today and contact you with the results.

## 2018-05-03 NOTE — Assessment & Plan Note (Signed)
Right hip pain.  This does not appear to be bursitis based on his exam with lack of tenderness.  It could be an intra-articular issue of his hip or referred pain from his back.  Will obtain an x-ray of his hip.  We will treat with ibuprofen as prescribed below.  If not beneficial over the next several days he will let us know.  If he requires this for longer than 4 to 5 days he will contact us.  He will take this with food.  If not improving consider referral to orthopedics.

## 2018-05-03 NOTE — Telephone Encounter (Signed)
Called and spoke with pt. Pt advised and voiced understanding.  

## 2018-05-03 NOTE — Progress Notes (Signed)
Alan Rumps, MD Phone: [phone]  DRAYTON TIEU is a 73 y.o. male who presents today for same-day visit.  Right hip pain: Patient notes right lateral hip discomfort.  He notes he thinks it is bursitis.  Hurts when he is just sitting there.  It hurts if he gets up and stands up or if he walks around.  He notes no injury.  He has had some back pain in his lower back.  No numbness or weakness.  No incontinence.  He notes the pain from his right hip does go down his right lateral thigh to just above the knee.  He has had no fevers.  He has had no recent illness.  Diabetes: Typically 93-149.  Taking Tresiba, Humalog, Jardiance, metformin, and Victoza.  He notes only one episode of hypoglycemia when he got his 2 insulins mixed up.  He was evaluated in the ED for this.  He notes no polyuria or polydipsia.  BPH: Currently taking Flomax twice daily.  He notes no straining.  No incontinence.  Occasionally he does feel like he is not completely empty after urinating.  Hypertension: He does not check at home.  He is taking carvedilol and HCTZ.  No chest pain or shortness of breath.  Patient does ask about his cyclobenzaprine and notes his insurance company went over this medication with him and advised him that there were 2 more preferred options.  He believes that they were baclofen and Zanaflex.  His pain specialist prescribes the cyclobenzaprine.  Social History   Tobacco Use  Smoking Status Former Smoker  Smokeless Tobacco Never Used     ROS see history of present illness  Objective  Physical Exam Vitals:   05/03/18 0918  BP: (!) 148/80  Pulse: (!) 103  SpO2: 95%    BP Readings from Last 3 Encounters:  05/03/18 (!) 148/80  03/26/18 (!) 139/100  03/15/18 (!) 144/72   Wt Readings from Last 3 Encounters:  03/26/18 250 lb (113.4 kg)  03/15/18 264 lb (119.7 kg)  03/03/18 264 lb (119.7 kg)    Physical Exam Constitutional:      General: He is not in acute distress.  Appearance: He is not diaphoretic.  Cardiovascular:     Rate and Rhythm: Normal rate and regular rhythm.     Heart sounds: Normal heart sounds.  Pulmonary:     Effort: Pulmonary effort is normal.     Breath sounds: Normal breath sounds.  Musculoskeletal:     Comments: No midline spine tenderness, no midline spine step-off, no muscular back tenderness, no discomfort on internal or external range of motion right hip, no discomfort on internal or external range of motion left hip, he has no tenderness over the external lateral portion of his hip or bursa on the right  Skin:    General: Skin is warm and dry.  Neurological:     Mental Status: He is alert.     Comments: 5/5 strength bilateral quads, hamstrings, plantar flexion, and dorsiflexion, sensation to light touch intact bilateral lower extremities      Assessment/Plan: Please see individual problem list.  Essential hypertension Mildly elevated today.  I suspect this is related to his discomfort.  We will plan to recheck his blood pressure at his next visit.  Diabetes ([place]) Continue current medication.  Check A1c.  BPH (benign prostatic hyperplasia) Reports decent control.  I did discuss the next step would be having him see a urologist though given the current coronavirus issues we will  hold off on that until a later date.  Right hip pain Right hip pain.  This does not appear to be bursitis based on his exam with lack of tenderness.  It could be an intra-articular issue of his hip or referred pain from his back.  Will obtain an x-ray of his hip.  We will treat with ibuprofen as prescribed below.  If not beneficial over the next several days he will let us know.  If he requires this for longer than 4 to 5 days he will contact us.  He will take this with food.  If not improving consider referral to orthopedics.   Health Maintenance: He reports having had a hepatitis C screening test done previously.  He is coming up on being due for  his eye doctor appointment.  Orders Placed This Encounter  Procedures  . DG Hip Unilat W OR W/O Pelvis 2-3 Views Right    Standing Status:   Future    Number of Occurrences:   1    Standing Expiration Date:   07/03/2019    Order Specific Question:   Reason for Exam (SYMPTOM  OR DIAGNOSIS REQUIRED)    Answer:   right hip pain, no injury, benign exam    Order Specific Question:   Preferred imaging location?    Answer:   [facility] Specific Question:   Radiology Contrast Protocol - do NOT remove file path    Answer:   \\[email or web address].pdf  . Basic Metabolic Panel (BMET)  . HgB A1c    Meds ordered this encounter  Medications  . ibuprofen (ADVIL,MOTRIN) 600 MG tablet    Sig: Take 1 tablet (600 mg total) by mouth every 8 (eight) hours as needed for mild pain. Take with food.    Dispense:  30 tablet    Refill:  0     Alan Rumps, MD [place]

## 2018-05-03 NOTE — Assessment & Plan Note (Signed)
Continue current medication.  Check A1c. 

## 2018-05-03 NOTE — Assessment & Plan Note (Signed)
Reports decent control.  I did discuss the next step would be having him see a urologist though given the current coronavirus issues we will hold off on that until a later date.

## 2018-05-04 ENCOUNTER — Telehealth: Payer: Self-pay | Admitting: Family Medicine

## 2018-05-04 NOTE — Telephone Encounter (Signed)
Patient returning call for results. NT unavailable.   Copied from [place] [phone]. Topic: Quick Communication - Lab Results (Clinic Use ONLY) >> May 04, 2018  8:47 AM Neta Ehlers, RMA wrote: Called patient to inform them of 05/02/2018 lab results. When patient returns call, triage nurse may disclose results.

## 2018-05-04 NOTE — Telephone Encounter (Signed)
Attempted to contact pt and give him lab results; left message on voicemail [phone].

## 2018-05-06 ENCOUNTER — Telehealth: Payer: Self-pay | Admitting: Family Medicine

## 2018-05-06 NOTE — Telephone Encounter (Signed)
Left message to call office back for lab results.  (He had called earlier).   His results are in the triage que.

## 2018-05-16 ENCOUNTER — Telehealth: Payer: Self-pay | Admitting: Pharmacist

## 2018-05-16 ENCOUNTER — Ambulatory Visit: Payer: Medicare Other | Admitting: Pharmacist

## 2018-05-16 DIAGNOSIS — Z794 Long term (current) use of insulin: Principal | ICD-10-CM

## 2018-05-16 DIAGNOSIS — E1142 Type 2 diabetes mellitus with diabetic polyneuropathy: Secondary | ICD-10-CM

## 2018-05-16 MED ORDER — FREESTYLE LIBRE 14 DAY READER DEVI: 1.0000 | Freq: Every day | 1 refills | Status: DC

## 2018-05-16 MED ORDER — FREESTYLE LIBRE 14 DAY SENSOR MISC: 1.0000 | 3 refills | Status: DC

## 2018-05-16 MED ORDER — GLUCOSE BLOOD VI STRP: ORAL_STRIP | 5 refills | Status: DC

## 2018-05-16 NOTE — Telephone Encounter (Signed)
Dr. [name],   [name] asked if he could get a refill on ibuprofen. He notes that it significantly helped his pain. He stated that he was told that he would need a BMP prior to refills, but I don't see documentation of this.   Thanks!

## 2018-05-16 NOTE — Telephone Encounter (Signed)
S:     Chief Complaint  Patient presents with  . Medication Management    Diabetes    Patient contacted telephonically for diabetes evaluation, education, and management at the request of Dr. [name] (referred on 05/03/2018 lab note). Last seen by primary care provider on 05/03/2018; at that time, he was instructed to increase Tresiba to 46 units daily.   Today, he notes that he is surprised at the increase in his sugars over the past few months. Notes some episodes of hypoglycemia when he overestimates the carbohydrate content of his meals. Has been giving Humalog 10 units, then an additional 2-3 units if his sugars remain high ~1 hour after eating.   He also asks if he can get a refill of the ibuprofen prescribed by Dr. [name] a few weeks ago, as it had a significantly positive benefit on his pain.   Insurance coverage/medication affordability: U[employer] Medicare + Medicaid   Patient reports adherence with medications.  Current diabetes medications include: Tresiba 46 units daily, Humalog sliding scale (based on food and sugar readings); has a hard time verbalizing, but gives 10 units base + occasionally 2-3 units additional based on higher sugars, metformin 1000 mg BID, Jardiance 25 mg daily, Victoza 1.8 mg daily  Patient reports occasional hypoglycemic s/sx including dizziness, shakiness, sweating.   O:   Lab Results  Component Value Date   HGBA1C 8.2 (H) 14/97/0263    Basic Metabolic Panel BMP Latest Ref Rng & Units 05/03/2018 03/26/2018 09/27/2017  Glucose 70 - 99 mg/dL 145(H) 128(H) 133(H)  BUN 6 - 23 mg/dL 12 15 16   Creatinine 0.40 - 1.50 mg/dL 1.15 1.00 1.11  Sodium 135 - 145 mEq/L 136 139 138  Potassium 3.5 - 5.1 mEq/L 4.3 3.6 4.2  Chloride 96 - 112 mEq/L 98 100 97  CO2 19 - 32 mEq/L 30 28 31   Calcium 8.4 - 10.5 mg/dL 9.4 9.4 10.0    Lipid Panel     Component Value Date/Time   CHOL 120 09/27/2017 0909   TRIG 95.0 09/27/2017 0909   HDL 50.40 09/27/2017 0909   CHOLHDL 2 09/27/2017 0909   VLDL 19.0 09/27/2017 0909   LDLCALC 51 09/27/2017 0909    Self-Monitored Blood Glucose Results Fasting SMBG: difficult to review over the phone, but reports readings of 125, 102, and notes they are <130 upon quesitoning 2 hour post-prandial/random SMBG: reports some randoms of 140-150s, some HS of 110-130s. Reports that he does not like to go to sleep with BG <90  A/P: #Diabetes - Currently uncontroled, most recent A1c 8.2% on 05/03/2018, worsened from 7.6% on 01/17/2018; Goal A1c <7%, though more relaxed goal of <8% may be appropriate given patient age. Potential for unsafe use of Humalog by injecting some before a meal, then some ~1 hour after for correction. Patient could not verbalize exactly how he decides prandial dosing; more information is needed for dose adjustments - Provided extensive education on pharmacokinetics of rapid acting insulin, explained that patient should not be giving an additional dose after the meal.  - Asked patient to start checking sugars fasting and 2 hour post prandial, as well as documenting what dose of Humalog he used. He notes that he would be most comfortable checking before and after every meal and before bed - As such, FreeStyle Libre meter + sensor would be useful in this patient. Submitted prescription today[facility], [facility] noted [facility] was not [facility]. [place], was told that the provider could submit a  request for coverage, but was not given any specific steps on how to do that. Will contact [place] again later this week for more information and collaborate with PCP  - Continue Tresiba 46 units daily, metformin 1000 mg BID, Jardiance 25 mg daily, Victoza 1.8 mg daily    Follow up in Pharmacist Clinic Visit 3 weeks  De Hollingshead, PharmD, [place] PGY2 Ambulatory Care Pharmacy Resident Phone: [phone]

## 2018-05-17 MED ORDER — FREESTYLE LIBRE 14 DAY READER DEVI: 1.0000 | Freq: Every day | 1 refills | Status: AC

## 2018-05-17 MED ORDER — FREESTYLE LIBRE 14 DAY READER DEVI: 1.0000 | Freq: Every day | 1 refills | Status: DC

## 2018-05-17 MED ORDER — FREESTYLE LIBRE 14 DAY SENSOR MISC: 1.0000 | 3 refills | Status: DC

## 2018-05-17 NOTE — Addendum Note (Signed)
Addended by: De Hollingshead on: 05/17/2018 02:44 PM   Modules accepted: Orders

## 2018-05-17 NOTE — Addendum Note (Signed)
Addended by: [name] on: 05/17/2018 06:13 PM   Modules accepted: Orders

## 2018-05-17 NOTE — Addendum Note (Signed)
Addended by: De Hollingshead on: 05/17/2018 05:01 PM   Modules accepted: Orders

## 2018-05-17 NOTE — Telephone Encounter (Signed)
Received advice that [facility] could not run glucometer under Medicare Part B, just D. FreeStyle Libre CGM appears to be covered for the patient under his [place]. Was informed that local [facility] could bill Part B.   [facility]; they noted that they would need new prescriptions sent to them to be able to try to run Freedom under Part B. Have resent these prescriptions; will follow up with the pharmacy later this afternoon to see if they ran through.   [name], PharmD, [place] PGY2 Ambulatory Care Pharmacy Resident, [place] [facility] Phone: [phone]

## 2018-05-17 NOTE — Telephone Encounter (Signed)
I have reviewed the above note and agree. I was available to the pharmacist for consultation.  I did discuss the BMET with the patient. I have placed an order for this and will forward this message to [place] to get him set up for the lab work some time this week or next week.   [name], MD

## 2018-05-18 NOTE — Telephone Encounter (Signed)
How frequently has he been using this medication? Thanks.

## 2018-05-18 NOTE — Telephone Encounter (Signed)
Called and spoke with pt to get him scheduled. Pt stated that he refill would like to have the medication refilled without coming to the office to do lab work due to the COVID-19 pt stated that most people who are dying from this are elder african [place] men with chronic illnesses and he is concern because he is elder and has DM.   Pt would must rather not come in to do lab work if that is OK with PCP.

## 2018-05-20 MED ORDER — IBUPROFEN 600 MG PO TABS: 600.0000 mg | ORAL_TABLET | Freq: Three times a day (TID) | ORAL | 0 refills | Status: DC | PRN

## 2018-05-20 NOTE — Telephone Encounter (Signed)
I will provide a small refill for him.  If he requires an additional refill we will need to have him come in to check his kidney function.  Thanks.

## 2018-05-20 NOTE — Addendum Note (Signed)
Addended by: [name] on: 05/20/2018 04:46 PM   Modules accepted: Orders

## 2018-05-20 NOTE — Telephone Encounter (Signed)
Pt stated that he is not taking it now but when he does he will take three tablets a day when needed for pain. Pt stated that he will take this medication about 3-4 days per week. Taking 3 tablets on the 3-4 days a week when he is in pain.  Sent to PCP to advise

## 2018-05-23 NOTE — Telephone Encounter (Signed)
Called pt and left a VM to call back.  

## 2018-05-24 ENCOUNTER — Telehealth: Payer: Self-pay

## 2018-05-24 NOTE — Telephone Encounter (Signed)
Copied from [place] [id number]. Topic: Quick Communication - Lab Results (Clinic Use ONLY) >> May 24, 2018  9:45 AM Gordy Councilman, CMA wrote: Patient was called to disucc his need for labs to be drawn, patient is afraid of catching the Covid-19 and needs to be reassured that he can come in to our lab at this time.please advise this to patient or send the call to me Gae Bon).  Thanks nina,cma >> May 24, 2018  1:01 PM Wynetta Emery, [place] C wrote: Pt is returning [name]'s call.

## 2018-05-24 NOTE — Telephone Encounter (Signed)
Called the patient to discuss his orders for the lab and assure him that he is not in any danger coming in for lab work, left a message on voicemail to return a call back to me.  Ema Hebner,cma

## 2018-05-25 ENCOUNTER — Ambulatory Visit: Payer: Medicare Other | Attending: Nurse Practitioner | Admitting: Nurse Practitioner

## 2018-05-25 ENCOUNTER — Other Ambulatory Visit: Payer: Self-pay

## 2018-05-26 ENCOUNTER — Telehealth: Payer: Self-pay | Admitting: Pharmacist

## 2018-05-26 NOTE — Telephone Encounter (Signed)
After research, determined that patient should be able to receive FreeStyle Elenor [place] from [facility] mail order.   Contacted [place] at [phone]. Placed an order request for [place] meter and sensors. They will contact the patient in the next 1-2 business days to verify benefits.   Once benefits investigation is complete, they will fax a [place] request to Dr. [name]. It will note the required clinical information (recent progress notes) that will need to be faxed back to [facility]. Once received, they will process and ship the meter to the patient.   I contacted patient and explained all of this to him. He expressed appreciation for our assistance.   Routing to Dr. [name], [place], and Kerin Salen for assistance with this. Once the DWO is faxed to the office, I recommend including the most recent visit note (05/03/2018) and the 01/17/2018 visit note, as the patient's diabetes control was addressed at that visit. Unsure if including my visit/telephone note would help, but feel free to include that too!

## 2018-05-27 NOTE — Telephone Encounter (Signed)
Patient was called and scheduled with Alan Schmidt and stated he would call back for appt for lab later.  [name],cma

## 2018-05-31 ENCOUNTER — Ambulatory Visit: Payer: Medicare Other | Attending: Nurse Practitioner | Admitting: Nurse Practitioner

## 2018-05-31 ENCOUNTER — Other Ambulatory Visit: Payer: Self-pay

## 2018-05-31 DIAGNOSIS — M7918 Myalgia, other site: Secondary | ICD-10-CM | POA: Diagnosis not present

## 2018-05-31 DIAGNOSIS — M25551 Pain in right hip: Secondary | ICD-10-CM

## 2018-05-31 DIAGNOSIS — M47816 Spondylosis without myelopathy or radiculopathy, lumbar region: Secondary | ICD-10-CM | POA: Diagnosis not present

## 2018-05-31 DIAGNOSIS — G894 Chronic pain syndrome: Secondary | ICD-10-CM

## 2018-05-31 DIAGNOSIS — G8929 Other chronic pain: Secondary | ICD-10-CM

## 2018-05-31 DIAGNOSIS — Z96641 Presence of right artificial hip joint: Secondary | ICD-10-CM

## 2018-05-31 NOTE — Progress Notes (Signed)
Pain Management Virtual Encounter Note - Virtual Visit via Telephone Telehealth (real-time audio visits between healthcare provider and patient).  Patient's Phone No. & Preferred Pharmacy:  [phone] (home); [phone] (mobile); (Preferred) [phone] [email or web address]  [id number] [address]Phone: [phone]: [phone] [id number]- [address] [facility] [place]Phone: [phone]: [phone]  Pre-screening note:  Our staff contacted Alan Schmidt offered him an "in person", "face-to-face" appointment versus a telephone encounter. He indicated preferring the telephone encounter, at this time.  Reason for Virtual Visit: COVID-19*  Social distancing based on CDC and AMA recommendations.   I contacted Alan Schmidt 05/31/2018 at 11:30 AM via telephone and clearly identified myself as CDionisio David NP. I verified that I was speaking with the correct person using two identifiers (Name and date of birth: 1March 25, 1947.  Advanced Informed Consent I sought verbal advanced consent from Alan Schmidt virtual visit interactions. I informed Mr. TKirchgessnerof possible security and privacy concerns, risks, and limitations associated with providing "not-in-person" medical evaluation and management services. I also informed Mr. TKernerof the availability of "in-person" appointments. Finally, I informed him that there would be a charge for the virtual visit and that he could be  personally, fully or partially, financially responsible for it. Alan Schmidt understanding and agreed to proceed.   Historic Elements   Mr. Alan NAJJARis a 73y.o. year old, male patient evaluated today after his last encounter by our practice on 05/25/2018. Alan Schmidt has a past medical history  of Acute postoperative pain (11/24/2016), Anxiety, Chronic hip pain (Right) (12/05/2014), Chronic lumbar pain, Depression, Hyperlipidemia, Hypertension, Kidney stones, and Migraines. He also  has a past surgical history that includes Total hip arthroplasty; Tonsillectomy; and right hip surgery. Mr. TSteegehas a current medication list which includes the following prescription(s): accu-chek fastclix lancets, acetaminophen, alprazolam, amlodipine, aspirin ec, bd pen needle nano u/f, blood glucose meter kit and supplies, carvedilol, freestyle libre 14 day reader, freestyle libre 14 day sensor, cyclobenzaprine, desoximetasone, escitalopram, glucose blood, hydrochlorothiazide, hydroxyzine, ibuprofen, insulin degludec, insulin lispro, jardiance, loratadine, magnesium, metformin, mometasone, naloxone, olmesartan, oxycodone hcl, oxycodone hcl, oxycodone hcl, pantoprazole, pravastatin, tamsulosin, and victoza. He  reports that he has quit smoking. He has never used smokeless tobacco. He reports that he does not drink alcohol or use drugs. Mr. TKirkpatrickis allergic to contrast media [iodinated diagnostic agents]; iodine; and shellfish allergy.   HPI  I last communicated with him on 05/25/2018. Today, he is being contacted for medication management. He admits that he is having 3 /10 lower back and right hip pain. He has been having this pain for several months. He discussed this at his last visit. He thought he was going to get an orthopedic referral but he did talk with his PCP and was referred. He received a Toradol injections and was instructed to start using the Ibuprofen instead of the APAP which would offer more relief because he had some inflammation. He felt like this was effective. He does have some other concerns that are not pain related.      Pharmacotherapy Assessment  Analgesic:Oxycodone IR 10 mg every 6 hours (40 mg/day) MME/day:60 mg/day.  Monitoring: Pharmacotherapy: No side-effects or adverse  reactions reported. [place] PMP: PDMP reviewed during this encounter.  Compliance: No problems identified or detected. Plan: Refer to "POC".  Review of recent tests  DG Hip Unilat W OR W/O Pelvis 2-3 Views Right CLINICAL DATA:  73 year old male with right hip pain intermittently for the past several weeks. No known injury. History of right hip replacement 9 years previously.  EXAM: DG HIP (WITH OR WITHOUT PELVIS) 2-3V RIGHT  COMPARISON:  Prior radiographs of the pelvis and right hip 10/13/2013  FINDINGS: Surgical changes of prior right total hip arthroplasty appear stable compared to 10/13/2013. No evidence of hardware complication. Similar appearance of metallic fragments in the region of the greater trochanter. No evidence of periprosthetic fracture or change in alignment. Moderate left hip joint degenerative osteoarthritis again noted. Lower lumbar degenerative disc disease at L4-L5 and L5-S1.  IMPRESSION: Surgical changes of right total hip arthroplasty without evidence of complication or significant change compared to prior imaging from 10/13/2013.  Moderate left hip joint degenerative osteoarthritis.  Lower lumbar degenerative disc disease.  Electronically Signed   By: Jacqulynn Cadet M.D.   On: 05/03/2018 10:23   Office Visit on 05/03/2018  Component Date Value Ref Range Status  . Sodium 05/03/2018 136  135 - 145 mEq/L Final  . Potassium 05/03/2018 4.3  3.5 - 5.1 mEq/L Final  . Chloride 05/03/2018 98  96 - 112 mEq/L Final  . CO2 05/03/2018 30  19 - 32 mEq/L Final  . Glucose, Bld 05/03/2018 145* 70 - 99 mg/dL Final  . BUN 05/03/2018 12  6 - 23 mg/dL Final  . Creatinine, Ser 05/03/2018 1.15  0.40 - 1.50 mg/dL Final  . Calcium 05/03/2018 9.4  8.4 - 10.5 mg/dL Final  . GFR 05/03/2018 75.59  >60.00 mL/min Final  . Hgb A1c MFr Bld 05/03/2018 8.2* 4.6 - 6.5 % Final   Glycemic Control Guidelines for People with Diabetes:Non Diabetic:  <6%Goal of Therapy:  <7%Additional Action Suggested:  >8%    Assessment  The primary encounter diagnosis was Lumbar spondylosis. 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  Plan of Care  I am having Alan Schmidt maintain his naloxone, hydrOXYzine, BD Pen Needle Nano U/F, acetaminophen, blood glucose meter kit and supplies, metFORMIN, aspirin EC, amLODipine, tamsulosin, olmesartan, Jardiance, escitalopram, pravastatin, ALPRAZolam, Accu-Chek FastClix Lancets, desoximetasone, insulin degludec, insulin lispro, Victoza, loratadine, glucose blood, FreeStyle Libre 14 Day Reader, FreeStyle Libre 14 Day Sensor, ibuprofen, cyclobenzaprine, Oxycodone HCl, Oxycodone HCl, and Oxycodone HCl.  Pharmacotherapy (Medications Ordered): Meds ordered this encounter  Medications  . cyclobenzaprine (FLEXERIL) 10 MG tablet    Sig: Take 1 tablet (10 mg total) by mouth 3 (three) times daily as needed for muscle spasms. Must last 30 days.    Dispense:  90 tablet    Refill:  2    Do not place this medication, or any other prescription from our practice, on "Automatic Refill". Patient may have prescription filled one day early if pharmacy is closed on scheduled refill date. No refills.    Order Specific Question:   Supervising Provider    Answer:   Milinda Pointer [phone]  . Oxycodone HCl 10 MG TABS    Sig: Take 1 tablet (10 mg total) by mouth every 6 (six) hours as needed for up to 30 days.    Dispense:  120 tablet    Refill:  0    Do not add this medication to the electronic "Automatic Refill" notification system. Patient  may have prescription filled one day early if pharmacy is closed on scheduled refill date.    Order Specific Question:   Supervising Provider    Answer:   Milinda Pointer [phone]  . Oxycodone HCl 10 MG TABS    Sig: Take 1 tablet (10 mg total) by mouth every 6 (six) hours as needed for  up to 30 days.    Dispense:  120 tablet    Refill:  0    Do not add this medication to the electronic "Automatic Refill" notification system. Patient may have prescription filled one day early if pharmacy is closed on scheduled refill date.    Order Specific Question:   Supervising Provider    Answer:   Milinda Pointer [phone]  . Oxycodone HCl 10 MG TABS    Sig: Take 1 tablet (10 mg total) by mouth every 6 (six) hours as needed for up to 30 days.    Dispense:  120 tablet    Refill:  0    Do not add this medication to the electronic "Automatic Refill" notification system. Patient may have prescription filled one day early if pharmacy is closed on scheduled refill date.    Order Specific Question:   Supervising Provider    Answer:   Milinda Pointer [phone]   Orders:  No orders of the defined types were placed in this encounter.  Follow-up plan:   Return in about 3 months (around 08/30/2018) for MedMgmt.   I discussed the assessment and treatment plan with the patient. The patient was provided an opportunity to ask questions and all were answered. The patient agreed with the plan and demonstrated an understanding of the instructions.  Patient advised to call back or seek an in-person evaluation if the symptoms or condition worsens.  Total duration of non-face-to-face encounter: 13 minutes.  Note by: Dionisio David, NP Date: 05/31/2018; Time: 2:04 PM  Disclaimer:  * Given the special circumstances of the COVID-19 pandemic, the federal government has announced that the Office for Civil Rights (OCR) will exercise its enforcement discretion and will not impose penalties on physicians using telehealth in the event of noncompliance with regulatory requirements under the [place] (HIPAA) in connection with the good faith provision of telehealth during the XBPQS-01 national public health emergency. ([place])

## 2018-05-31 NOTE — Patient Instructions (Signed)
____________________________________________________________________________________________  Medication Rules  Purpose: To inform patients, and their family members, of our rules and regulations.  Applies to: All patients receiving prescriptions (written or electronic).  Pharmacy of record: Pharmacy where electronic prescriptions will be sent. If written prescriptions are taken to a different pharmacy, please inform the nursing staff. The pharmacy listed in the electronic medical record should be the one where you would like electronic prescriptions to be sent.  Electronic prescriptions: In compliance with the [place] Strengthen Opioid Misuse Prevention (STOP) Act of 2017 (Session Law 2017-74/H243), effective February 02, 2018, all controlled substances must be electronically prescribed. Calling prescriptions to the pharmacy will cease to exist.  Prescription refills: Only during scheduled appointments. Applies to all prescriptions.  NOTE: The following applies primarily to controlled substances (Opioid* Pain Medications).   Patient's responsibilities: 1. Pain Pills: Bring all pain pills to every appointment (except for procedure appointments). 2. Pill Bottles: Bring pills in original pharmacy bottle. Always bring the newest bottle. Bring bottle, even if empty. 3. Medication refills: You are responsible for knowing and keeping track of what medications you take and those you need refilled. The day before your appointment: write a list of all prescriptions that need to be refilled. The day of the appointment: give the list to the admitting nurse. Prescriptions will be written only during appointments. No prescriptions will be written on procedure days. If you forget a medication: it will not be "Called in", "Faxed", or "electronically sent". You will need to get another appointment to get these prescribed. No early refills. Do not call asking to have your prescription filled  early. 4. Prescription Accuracy: You are responsible for carefully inspecting your prescriptions before leaving our office. Have the discharge nurse carefully go over each prescription with you, before taking them home. Make sure that your name is accurately spelled, that your address is correct. Check the name and dose of your medication to make sure it is accurate. Check the number of pills, and the written instructions to make sure they are clear and accurate. Make sure that you are given enough medication to last until your next medication refill appointment. 5. Taking Medication: Take medication as prescribed. When it comes to controlled substances, taking less pills or less frequently than prescribed is permitted and encouraged. Never take more pills than instructed. Never take medication more frequently than prescribed.  6. Inform other Doctors: Always inform, all of your healthcare providers, of all the medications you take. 7. Pain Medication from other Providers: You are not allowed to accept any additional pain medication from any other Doctor or Healthcare provider. There are two exceptions to this rule. (see below) In the event that you require additional pain medication, you are responsible for notifying us, as stated below. 8. Medication Agreement: You are responsible for carefully reading and following our Medication Agreement. This must be signed before receiving any prescriptions from our practice. Safely store a copy of your signed Agreement. Violations to the Agreement will result in no further prescriptions. (Additional copies of our Medication Agreement are available upon request.) 9. Laws, Rules, & Regulations: All patients are expected to follow all Federal and State Laws, Statutes, Rules, & Regulations. Ignorance of the Laws does not constitute a valid excuse. The use of any illegal substances is prohibited. 10. Adopted CDC guidelines & recommendations: Target dosing levels will be  at or below 60 MME/day. Use of benzodiazepines** is not recommended.  Exceptions: There are only two exceptions to the rule of not   receiving pain medications from other Healthcare Providers. 1. Exception #1 (Emergencies): In the event of an emergency (i.e.: accident requiring emergency care), you are allowed to receive additional pain medication. However, you are responsible for: As soon as you are able, call our office [phone], at any time of the day or night, and leave a message stating your name, the date and nature of the emergency, and the name and dose of the medication prescribed. In the event that your call is answered by a member of our staff, make sure to document and save the date, time, and the name of the person that took your information.  2. Exception #2 (Planned Surgery): In the event that you are scheduled by another doctor or dentist to have any type of surgery or procedure, you are allowed (for a period no longer than 30 days), to receive additional pain medication, for the acute post-op pain. However, in this case, you are responsible for picking up a copy of our "Post-op Pain Management for Surgeons" handout, and giving it to your surgeon or dentist. This document is available at our office, and does not require an appointment to obtain it. Simply go to our office during business hours (Monday-Thursday from 8:00 AM to 4:00 PM) (Friday 8:00 AM to 12:00 Noon) or if you have a scheduled appointment with us, prior to your surgery, and ask for it by name. In addition, you will need to provide us with your name, name of your surgeon, type of surgery, and date of procedure or surgery.  *Opioid medications include: morphine, codeine, oxycodone, oxymorphone, hydrocodone, hydromorphone, meperidine, tramadol, tapentadol, buprenorphine, fentanyl, methadone. **Benzodiazepine medications include: diazepam (Valium), alprazolam (Xanax), clonazepam (Klonopine), lorazepam (Ativan), clorazepate  (Tranxene), chlordiazepoxide (Librium), estazolam (Prosom), oxazepam (Serax), temazepam (Restoril), triazolam (Halcion) (Last updated: 04/01/2017) ____________________________________________________________________________________________    

## 2018-06-01 ENCOUNTER — Other Ambulatory Visit: Payer: Self-pay

## 2018-06-01 ENCOUNTER — Telehealth: Payer: Self-pay | Admitting: Family Medicine

## 2018-06-01 ENCOUNTER — Other Ambulatory Visit: Payer: Self-pay | Admitting: Family Medicine

## 2018-06-01 MED ORDER — PANTOPRAZOLE SODIUM 40 MG PO TBEC: 40.0000 mg | DELAYED_RELEASE_TABLET | Freq: Every day | ORAL | 1 refills | Status: DC

## 2018-06-01 MED ORDER — CYCLOBENZAPRINE HCL 10 MG PO TABS: 10.0000 mg | ORAL_TABLET | Freq: Three times a day (TID) | ORAL | 2 refills | Status: DC | PRN

## 2018-06-01 MED ORDER — HYDROCHLOROTHIAZIDE 25 MG PO TABS: 25.0000 mg | ORAL_TABLET | Freq: Every day | ORAL | 1 refills | Status: DC

## 2018-06-01 MED ORDER — CARVEDILOL 25 MG PO TABS: 25.0000 mg | ORAL_TABLET | Freq: Two times a day (BID) | ORAL | 1 refills | Status: DC

## 2018-06-01 MED ORDER — OXYCODONE HCL 10 MG PO TABS: 10.0000 mg | ORAL_TABLET | Freq: Four times a day (QID) | ORAL | 0 refills | Status: DC | PRN

## 2018-06-01 MED ORDER — MAGNESIUM 30 MG PO TABS: 30.0000 mg | ORAL_TABLET | Freq: Two times a day (BID) | ORAL | 3 refills | Status: DC

## 2018-06-01 MED ORDER — MOMETASONE FUROATE 50 MCG/ACT NA SUSP: 2.0000 | Freq: Every day | NASAL | 4 refills | Status: DC

## 2018-06-01 NOTE — Telephone Encounter (Signed)
I looked up patient on [place] Controlled Substances Reporting System and saw no activity that raised concern of inappropriate use.   

## 2018-06-01 NOTE — Telephone Encounter (Signed)
Medications were sent to pt's pharmacy.  Maynor Mwangi,cma

## 2018-06-01 NOTE — Telephone Encounter (Signed)
Last OV 05/03/2018  Last refilled 03/15/2018 disp 30 with 1 refills   Next OV 09/09/2018  Sent to covering provider for approval

## 2018-06-01 NOTE — Telephone Encounter (Unsigned)
Copied from [place] [phone]. Topic: Quick Communication - Rx Refill/Question >> Jun 01, 2018  1:19 PM Alanda Slim E wrote: Medication: carvedilol (COREG) 25 MG tablet  hydrochlorothiazide (HYDRODIURIL) 25 MG tablet  mometasone (NASONEX) 50 MCG/ACT nasal spray  pantoprazole (PROTONIX) 40 MG tablet magnesium 30 MG tablet - pharmacy does not have this med and wants to have a supplement or alternative sent in if possible   Has the patient contacted their pharmacy? Yes - not all medications were ordered   Preferred Pharmacy (with phone number or street name): [id number] [place], [place] - [address] AT [facility] [phone] (Phone) [phone] (Fax)    Agent: Please be advised that RX refills may take up to 3 business days. We ask that you follow-up with your pharmacy.

## 2018-06-01 NOTE — Progress Notes (Signed)
Pt needed medications sent to his pharmacy, this was done today.  Nolon Yellin,cma

## 2018-06-06 ENCOUNTER — Telehealth: Payer: Self-pay | Admitting: Pharmacist

## 2018-06-06 ENCOUNTER — Ambulatory Visit: Payer: Medicare Other | Admitting: Pharmacist

## 2018-06-06 NOTE — Telephone Encounter (Signed)
S:     Chief Complaint  Patient presents with  . Medication Management    Diabetes    Patient contacted telephonically for diabetes evaluation, education, and management at the request of Dr. [name] (referred on 05/03/2018 lab note). Last seen by primary care provider on 05/03/2018; at that time, he was instructed to increase Tresiba to 46 units daily.  Patient notes that he is testing blood sugar fasting, before each meal, 2 hours after each meal, and before bed. He notes that he often forgets to take mealtime insulin prior to meals, so occasionally takes 1-2 hours after meals. He hopes that he will be able to get the FreeStyle Libre CGM covered to help him better monitor his blood sugars safely. He notes that low blood sugars are extremely debilitating to him. He does not have his blood sugar meter near him, but reviewed blood sugar result trends from memory.   Insurance coverage/medication affordability: UHC Dual Complete Medicare + Medicaid   Patient reports adherence with medications, though sometimes takes mealtime insulin ~1 hr after meal Current diabetes medications include: metformin 1000 mg BID, Jardiance 25 mg daily, Victoza 1.8 mg daily, Tresiba 64 units daily, Humalog 2-10 units with meals 3x daily  Patient reports hypoglycemic s/sx including hunger. Patient reports hyperglycemic symptoms including polydipsia, polyphagia, nocturia, neuropathy, blurred vision. Patient reports self foot exams.   O:   Lab Results  Component Value Date   HGBA1C 8.2 (H) 17/00/1749   Basic Metabolic Panel BMP Latest Ref Rng & Units 05/03/2018 03/26/2018 09/27/2017  Glucose 70 - 99 mg/dL 145(H) 128(H) 133(H)  BUN 6 - 23 mg/dL 12 15 16   Creatinine 0.40 - 1.50 mg/dL 1.15 1.00 1.11  Sodium 135 - 145 mEq/L 136 139 138  Potassium 3.5 - 5.1 mEq/L 4.3 3.6 4.2  Chloride 96 - 112 mEq/L 98 100 97  CO2 19 - 32 mEq/L 30 28 31   Calcium 8.4 - 10.5 mg/dL 9.4 9.4 10.0   Lipid Panel     Component Value  Date/Time   CHOL 120 09/27/2017 0909   TRIG 95.0 09/27/2017 0909   HDL 50.40 09/27/2017 0909   CHOLHDL 2 09/27/2017 0909   VLDL 19.0 09/27/2017 0909   LDLCALC 51 09/27/2017 0909    Self-Monitored Blood Glucose Results Fasting SMBG: 91-140s Pre-lunch/dinner SMBG: 115-150s 2 hour post-prandial SMBG: 150-181, one reading of 250 - notes that he probably forgot to take insulin with the meal HS: typically 170-180s   A/P: #Diabetes - Currently uncontrolled, most recent A1c 8.2% on 05/03/2018. Goal A1c <7%. Patient reports fluctuations in his blood sugars, likely from variable use of mealtime insulin. CGM would be appropriate for this patient to help him monitor trends in post-prandial blood sugars resulting from his meal and insulin dosing choices. - Continue Tresiba 46 units daily.  - Continue Humalog 2-10 units TID with meals. Counseled patient to take prandial insulin right before/with a meal, instead of 1-2 hours after, to prevent later hypoglycemia. He verbalized understanding - Continue metformin 1000 mg BID, Jardiance 25 mg daily, Victoza 1.8 mg daily    Follow up in Pharmacist Clinic Visit 4 weeks.  De Hollingshead, PharmD, [place] PGY2 Ambulatory Care Pharmacy Resident Phone: [phone]

## 2018-06-06 NOTE — Telephone Encounter (Signed)
No action needed at this time; just awaiting [place] to send [place] paperwork to the office.   [name], PharmD, [place] PGY2 Ambulatory Care Pharmacy Resident, [facility] Phone: [phone]

## 2018-06-06 NOTE — Telephone Encounter (Signed)
Contacted [place] to follow up on status of FreeStyle Libre order. I re-verified the patient information, and placed an order request for a reader and 90 day supply of sensors. They will go through the insurance verification process, and contact the patient to discuss any copay. They will be faxing DWO request and will need 6 months of visit notes.   Will talk to patient during visit this afternoon about the process

## 2018-06-06 NOTE — Telephone Encounter (Signed)
I have reviewed the above note and agree. I was available to the pharmacist for consultation. Will send message back to pharmacist to see if we need to prescribe CGM for the patient.   [name], MD

## 2018-06-07 ENCOUNTER — Encounter: Payer: Self-pay | Admitting: Family Medicine

## 2018-06-07 ENCOUNTER — Ambulatory Visit: Payer: Self-pay

## 2018-06-07 ENCOUNTER — Ambulatory Visit (INDEPENDENT_AMBULATORY_CARE_PROVIDER_SITE_OTHER): Payer: Medicare Other | Admitting: Family Medicine

## 2018-06-07 ENCOUNTER — Telehealth: Payer: Self-pay

## 2018-06-07 ENCOUNTER — Other Ambulatory Visit: Payer: Self-pay

## 2018-06-07 DIAGNOSIS — M7989 Other specified soft tissue disorders: Secondary | ICD-10-CM

## 2018-06-07 NOTE — Telephone Encounter (Signed)
This should be evaluated today if he is willing. He could do a virtual visit with Lauren or he could go to urgent care for evaluation. If he declines those options he needs to be seen immediately if the swelling worsens or if he develops chest pain or shortness of breath.

## 2018-06-07 NOTE — Telephone Encounter (Signed)
Patient called and says he's been having right foot/ankle swelling for the past 2-3 days, so much that he's not able to get a shoe on the right foot. He says he managed to get a sneaker on, but it was tight. He denies pain to the foot. He says he notices a little swelling up the leg. He denies other symptoms. I called the office and spoke to [place], [facility] who asked to speak to the patient, the call was connected successfully.  Answer Assessment - Initial Assessment Questions 1. LOCATION: "Which joint is swollen?"     Right foot and a little in the ankle 2. ONSET: "When did the swelling start?"     2 nights ago 3. SIZE: "How large is the swelling?"     Unable to put a shoe on 4. PAIN: "Is there any pain?" If so, ask: "How bad is it?" (Scale 1-10; or mild, moderate, severe)     No 5. CAUSE: "What do you think caused the swollen joint?"     I don't know 6. OTHER SYMPTOMS: "Do you have any other symptoms?" (e.g., fever, chest pain, difficulty breathing, calf pain)     A little swelling up the right leg 7. PREGNANCY: "Is there any chance you are pregnant?" "When was your last menstrual period?"     N/A  Protocols used: ANKLE SWELLING-A-AH

## 2018-06-07 NOTE — Telephone Encounter (Signed)
Pt is seeing l. guse today @ 3pm.  [name],cma

## 2018-06-07 NOTE — Telephone Encounter (Signed)
Copied from [place] [phone]. Topic: Quick Communication - Appointment Cancellation >> Jun 07, 2018  1:38 PM Lennox Solders wrote: Patient called to cancel appointment scheduled for tomorrow for labs .

## 2018-06-07 NOTE — Progress Notes (Signed)
Patient ID: Alan Schmidt, male   DOB: January 29, 1946, 73 y.o.   MRN: [id number]    Virtual Visit via Video Note  This visit type was conducted due to national recommendations for restrictions regarding the COVID-19 pandemic (e.g. social distancing).  This format is felt to be most appropriate for this patient at this time.  All issues noted in this document were discussed and addressed.  No physical exam was performed (except for noted visual exam findings with Video Visits).   I connected with Alan Schmidt today at  3:00 PM EDT by a video enabled telemedicine application or telephone and verified that I am speaking with the correct person using two identifiers. Location patient: home Location provider: [facility] Persons participating in the virtual visit: patient, provider  I discussed the limitations, risks, security and privacy concerns of performing an evaluation and management service by video and the availability of in person appointments. I also discussed with the patient that there may be a patient responsible charge related to this service. The patient expressed understanding and agreed to proceed.   HPI:  Patient and I connected via video today to discuss right lower extremity leg swelling.  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  This AM his right foot was so swollen, he had to force shoe on. States he has tried to elevate his leg throughout today and swelling has gone down significantly.  Denies pain, redness or heat of right lower extremity.  States right lower extremity is still somewhat swollen compared to left but does believe it is bigger since he has been able to elevate it.  Patient does have extensive medical history including diabetes, hypertension, hyperlipidemia.  Patient states that he has had  issues of right lower extremity swelling about 10 years ago after a hip surgery, states his right leg swelled after right hip replacement surgical revision and after riding in car for extended period of time.  Otherwise denies any issues with lower extremity swelling and denies any history of blood clots or known clotting issues.  He does take 81 mg aspirin daily.  Denies any cough, shortness of breath or wheezing.  Denies chest pain or palpitations.  Denies GI or GU issues.  Denies fever or chills.  Denies body aches.  ROS: See pertinent positives and negatives per HPI.  Past Medical History:  Diagnosis Date  . Acute postoperative pain 11/24/2016  . Anxiety   . Chronic hip pain (Right) 12/05/2014  . Chronic lumbar pain   . Depression   . Hyperlipidemia   . Hypertension   . Kidney stones   . Migraines     Past Surgical History:  Procedure Laterality Date  . right hip surgery     4 surgeries  . TONSILLECTOMY    . TOTAL HIP ARTHROPLASTY      Family History  Problem Relation Age of Onset  . Cancer Mother   . Heart disease Father   . Stroke Father     Social History   Tobacco Use  . Smoking status: Former Research scientist (life sciences)  . Smokeless tobacco: Never Used  Substance Use Topics  . Alcohol use: No    Alcohol/week: 0.0 standard drinks    Current Outpatient Medications:  .  ACCU-CHEK FASTCLIX LANCETS MISC, CHECK 4 TIMES A DAY, Disp: 102  each, Rfl: 2 .  acetaminophen (TYLENOL) 500 MG tablet, Take 500 mg by mouth every 6 (six) hours as needed., Disp: , Rfl:  .  ALPRAZolam (XANAX) 1 MG tablet, TAKE 0.5 TABLETS (0.5 MG TOTAL) BY MOUTH 2 (TWO) TIMES DAILY AS NEEDED FOR ANXIETY., Disp: 30 tablet, Rfl: 0 .  amLODipine (NORVASC) 5 MG tablet, TAKE 1 TABLET BY MOUTH EVERY DAY, Disp: 90 tablet, Rfl: 3 .  aspirin EC 81 MG tablet, Take 81 mg by mouth daily., Disp: , Rfl:  .  blood glucose meter kit and supplies KIT, Accu-Chek guide meter and Accu-Chek guide test strips with Accu-Chek fast clix  lancets, check blood sugars 4 times daily, ICD 10 code E11.9, Disp: 1 each, Rfl: 0 .  carvedilol (COREG) 25 MG tablet, Take 1 tablet (25 mg total) by mouth 2 (two) times daily., Disp: 180 tablet, Rfl: 1 .  Continuous Blood Gluc Receiver (FREESTYLE LIBRE 14 DAY READER) DEVI, 1 Device by Does not apply route daily. Use to scan to check blood sugar up to 7 times daily; E11.42,, Disp: 1 Device, Rfl: 1 .  Continuous Blood Gluc Sensor (FREESTYLE LIBRE 14 DAY SENSOR) MISC, 1 Device by Does not apply route every 14 (fourteen) days. E11.42, Disp: 6 each, Rfl: 3 .  [START ON 06/15/2018] cyclobenzaprine (FLEXERIL) 10 MG tablet, Take 1 tablet (10 mg total) by mouth 3 (three) times daily as needed for muscle spasms. Must last 30 days., Disp: 90 tablet, Rfl: 2 .  desoximetasone (TOPICORT) 0.25 % cream, APPLY CREAM TO AFFECTED AREA TWO TIMES DAILY, for up to 7 days, do not apply to face, Disp: 30 g, Rfl: 0 .  escitalopram (LEXAPRO) 10 MG tablet, TAKE 1 TABLET (10 MG TOTAL) BY MOUTH DAILY. START THE DAY AFTER STOPPING PROZAC., Disp: 90 tablet, Rfl: 1 .  glucose blood (ACCU-CHEK GUIDE) test strip, Use as instructed to check sugar up to 7 times daily, Disp: 300 each, Rfl: 5 .  hydrochlorothiazide (HYDRODIURIL) 25 MG tablet, Take 1 tablet (25 mg total) by mouth daily., Disp: 90 tablet, Rfl: 1 .  hydrOXYzine (ATARAX/VISTARIL) 50 MG tablet, TAKE 1 TABLET BY MOUTH FOUR TIMES A DAY AS NEEDED, Disp: 90 tablet, Rfl: 1 .  ibuprofen (ADVIL) 600 MG tablet, Take 1 tablet (600 mg total) by mouth every 8 (eight) hours as needed for mild pain. Take with food., Disp: 20 tablet, Rfl: 0 .  insulin degludec (TRESIBA FLEXTOUCH) 100 UNIT/ML SOPN FlexTouch Pen, INJECT 40 UNITS INTO THE SKIN DAILY AT 10 PM., Disp: 15 pen, Rfl: 3 .  insulin lispro (HUMALOG KWIKPEN) 100 UNIT/ML KwikPen, INJECT 0.12-0.18 MLS (12-18 UNITS TOTAL) INTO THE SKIN 2 (TWO) TIMES DAILY WITH A MEAL., Disp: 15 pen, Rfl: 2 .  JARDIANCE 25 MG TABS tablet, TAKE 25 MG BY MOUTH  DAILY., Disp: 30 tablet, Rfl: 3 .  metFORMIN (GLUCOPHAGE) 1000 MG tablet, TAKE 1 TABLET (1,000 MG TOTAL) BY MOUTH 2 (TWO) TIMES DAILY WITH A MEAL., Disp: 180 tablet, Rfl: 2 .  mometasone (NASONEX) 50 MCG/ACT nasal spray, Place 2 sprays into the nose daily., Disp: 17 g, Rfl: 4 .  olmesartan (BENICAR) 40 MG tablet, TAKE 1 TABLET BY MOUTH EVERY DAY, Disp: 90 tablet, Rfl: 2 .  [START ON 08/15/2018] Oxycodone HCl 10 MG TABS, Take 1 tablet (10 mg total) by mouth every 6 (six) hours as needed for up to 30 days., Disp: 120 tablet, Rfl: 0 .  [START ON 07/16/2018] Oxycodone HCl 10 MG TABS, Take 1 tablet (10  mg total) by mouth every 6 (six) hours as needed for up to 30 days., Disp: 120 tablet, Rfl: 0 .  [START ON 06/16/2018] Oxycodone HCl 10 MG TABS, Take 1 tablet (10 mg total) by mouth every 6 (six) hours as needed for up to 30 days., Disp: 120 tablet, Rfl: 0 .  pantoprazole (PROTONIX) 40 MG tablet, Take 1 tablet (40 mg total) by mouth daily., Disp: 90 tablet, Rfl: 1 .  pravastatin (PRAVACHOL) 40 MG tablet, TAKE 1 TABLET BY MOUTH EVERY DAY, Disp: 90 tablet, Rfl: 1 .  tamsulosin (FLOMAX) 0.4 MG CAPS capsule, TAKE 1 CAPSULE (0.4 MG TOTAL) BY MOUTH 2 (TWO) TIMES DAILY., Disp: 180 capsule, Rfl: 2 .  VICTOZA 18 MG/3ML SOPN, INJECT 0.3 MLS (1.8 MG TOTAL) INTO THE SKIN DAILY., Disp: 27 pen, Rfl: 1 .  BD PEN NEEDLE NANO U/F 32G X 4 MM MISC, USE EVERY DAY, Disp: 100 each, Rfl: 4 .  loratadine (CLARITIN) 10 MG tablet, Take 1 tablet (10 mg total) by mouth daily., Disp: 30 tablet, Rfl: 11 .  magnesium 30 MG tablet, Take 1 tablet (30 mg total) by mouth 2 (two) times daily. (Patient not taking: Reported on 06/07/2018), Disp: 60 tablet, Rfl: 3 .  naloxone (NARCAN) 2 MG/2ML injection, Inject content of syringe into thigh muscle. Call 911. (Patient not taking: Reported on 06/07/2018), Disp: 2 Syringe, Rfl: 1  EXAM:  GENERAL: alert, oriented, appears well and in no acute distress  HEENT: atraumatic, conjunttiva clear, no obvious  abnormalities on inspection of external nose and ears  NECK: normal movements of the head and neck  LUNGS: on inspection no signs of respiratory distress, breathing rate appears normal, no obvious gross SOB, gasping or wheezing  CV: no obvious cyanosis  LE: Right lower extremity does appear larger than the left and when patient turns videocamera towards the legs.  Right lower extremity does not appear red.  When patient presses in right lower extremity with his fingers, does leave a light pitting mark, +1.  Patient is able to wiggle toes on both feet, rolled both ankles and bend both knees without issue.  MS: moves all visible extremities without noticeable abnormality  PSYCH/NEURO: pleasant and cooperative, no obvious depression or anxiety, speech and thought processing grossly intact  ASSESSMENT AND PLAN:  Discussed the following assessment and plan:  Swelling of right lower extremity - Plan: US Venous Img Lower Unilateral Right  Due to patient's right lower extremity still being swollen even after elevation throughout the day and his complex medical history including diabetes hyperlipidemia and hypertension he is somewhat I considered a high risk for possible development of a DVT.  We need to be sure and rule out DVT.  We will get Doppler study to do this.  Once we have results of Doppler study, we will better be able to determine next step in her plan of care.  Patient has been advised to keep the legs elevated whenever possible.  He is aware someone will call him in regards to Doppler appointment, due to it being later in the day today; it will be scheduled for early tomorrow morning.  Patient also has blood work scheduled for the end of this week, he has been advised to keep his lab work appointment.    I discussed the assessment and treatment plan with the patient. The patient was provided an opportunity to ask questions and all were answered. The patient agreed with the plan and  demonstrated an understanding of the instructions.  The patient was advised to call back or seek an in-person evaluation if the symptoms worsen or if the condition fails to improve as anticipated.  Jodelle Green, FNP

## 2018-06-08 ENCOUNTER — Ambulatory Visit: Payer: Medicare Other

## 2018-06-08 ENCOUNTER — Other Ambulatory Visit: Payer: Medicare Other

## 2018-06-10 ENCOUNTER — Ambulatory Visit: Payer: Medicare Other | Admitting: Family Medicine

## 2018-06-10 ENCOUNTER — Telehealth: Payer: Self-pay

## 2018-06-10 ENCOUNTER — Other Ambulatory Visit: Payer: Self-pay

## 2018-06-10 ENCOUNTER — Other Ambulatory Visit (INDEPENDENT_AMBULATORY_CARE_PROVIDER_SITE_OTHER): Payer: Medicare Other

## 2018-06-10 DIAGNOSIS — E1142 Type 2 diabetes mellitus with diabetic polyneuropathy: Secondary | ICD-10-CM | POA: Diagnosis not present

## 2018-06-10 DIAGNOSIS — Z794 Long term (current) use of insulin: Secondary | ICD-10-CM

## 2018-06-10 LAB — BASIC METABOLIC PANEL
BUN: 15 mg/dL (ref 6–23)
CO2: 31 mEq/L (ref 19–32)
Calcium: 9 mg/dL (ref 8.4–10.5)
Chloride: 101 mEq/L (ref 96–112)
Creatinine, Ser: 0.94 mg/dL (ref 0.40–1.50)
GFR: 95.37 mL/min (ref >60.00)
Glucose, Bld: 88 mg/dL (ref 70–99)
Potassium: 4 mEq/L (ref 3.5–5.1)
Sodium: 141 mEq/L (ref 135–145)

## 2018-06-10 NOTE — Telephone Encounter (Signed)
[facility]   Fax   Paper work nees to be filled out and signed by PCP to fax.   Placed in RED folder

## 2018-06-12 NOTE — Telephone Encounter (Signed)
Completed. Please attach [name]'s phone note from 05/16/18 and my office not from 05/03/18. Thanks.

## 2018-06-16 NOTE — Telephone Encounter (Signed)
Tiffany with [facility] called for a update on the paperwork regarding pt diabetic supplies. Tiffany requests that the paperwork with the clinical notes be faxed to [phone]. Cb# [phone]

## 2018-06-16 NOTE — Telephone Encounter (Signed)
See below

## 2018-06-20 NOTE — Telephone Encounter (Signed)
THIS HAS BEEN FAXED.

## 2018-06-22 ENCOUNTER — Other Ambulatory Visit: Payer: Self-pay | Admitting: Family Medicine

## 2018-06-23 NOTE — Telephone Encounter (Signed)
Last OV 5/52020  Last refilled  ibuprofen (ADVIL) 600 MG tablet 20 tablet 0 05/20/2018     Next OV 09/09/2018

## 2018-06-25 ENCOUNTER — Other Ambulatory Visit: Payer: Self-pay | Admitting: Family Medicine

## 2018-06-28 ENCOUNTER — Other Ambulatory Visit: Payer: Self-pay | Admitting: Family Medicine

## 2018-06-28 MED ORDER — EMPAGLIFLOZIN 25 MG PO TABS: ORAL_TABLET | ORAL | 3 refills | Status: DC

## 2018-06-28 NOTE — Telephone Encounter (Signed)
Called [facility] to follow up on receiving the clinical notes below. They note that the documentation was "insuffient" because it did not note how many times a day patient was checking/injecting insulin. They note that they faxed an addendum to the office on 06/23/2018 that needs to be completed.   [facility], have you seen/received this? Thanks!

## 2018-06-28 NOTE — Telephone Encounter (Signed)
Copied from [place] [id number]. Topic: Quick Communication - Rx Refill/Question >> Jun 28, 2018 12:33 PM Rayann Heman wrote: Medication: JARDIANCE 25 MG TABS tablet [[id number]]   Has the patient contacted their pharmacy? no Preferred Pharmacy (with phone number or street name): [id number] [address], [place] [phone] (Phone) [phone] (Fax)    Agent: Please be advised that RX refills may take up to 3 business days. We ask that you follow-up with your pharmacy.

## 2018-06-28 NOTE — Telephone Encounter (Addendum)
Please see [name]'s note. Did we receive this? Is it in [facility] folder or in my box?

## 2018-07-04 ENCOUNTER — Telehealth: Payer: Self-pay | Admitting: Family Medicine

## 2018-07-04 NOTE — Telephone Encounter (Signed)
Copied from [place] [id number]. Topic: Quick Communication - See Telephone Encounter >> Jul 04, 2018  4:15 PM Vernona Rieger wrote: CRM for notification. See Telephone encounter for: 07/04/18. Patient called to let Dr [name] know that his left foot is still swollen/red/darker. It is somewhat better but still swollen. He said he went 25 days without his water pill. He wants to know does he need to let Dr Biagio Quint see his foot? Please advise.  Also, he would like a refill on his ibuprofen (ADVIL) 600 MG tablet. He is down to a couple pills. [id number] [place] [address] [place] [address] Phone: [phone] Fax: [phone]

## 2018-07-05 ENCOUNTER — Other Ambulatory Visit: Payer: Self-pay

## 2018-07-05 ENCOUNTER — Ambulatory Visit (INDEPENDENT_AMBULATORY_CARE_PROVIDER_SITE_OTHER): Payer: Medicare Other | Admitting: Family Medicine

## 2018-07-05 DIAGNOSIS — M7989 Other specified soft tissue disorders: Secondary | ICD-10-CM

## 2018-07-05 NOTE — Progress Notes (Signed)
Patient ID: Alan Schmidt, male   DOB: [birth year], 73 y.o.   MRN: [id number]    Virtual Visit via video Note  This visit type was conducted due to national recommendations for restrictions regarding the COVID-19 pandemic (e.g. social distancing).  This format is felt to be most appropriate for this patient at this time.  All issues noted in this document were discussed and addressed.  No physical exam was performed (except for noted visual exam findings with Video Visits).   I connected with Alan Schmidt today at  1:40 PM EDT by a video enabled telemedicine application and verified that I am speaking with the correct person using two identifiers. Location patient: home Location provider: work or home office Persons participating in the virtual visit: patient, provider  I discussed the limitations, risks, security and privacy concerns of performing an evaluation and management service by video and the availability of in person appointments. I also discussed with the patient that there may be a patient responsible charge related to this service. The patient expressed understanding and agreed to proceed.  HPI: Patient and I think via video due to complaints of continued right lower extremity swelling.  Patient complained of this issue back in May 2020, Doppler study was set up to rule out blood clot.  Patient ended up not going to Doppler appointment after swelling improving the next morning with some elevation.  Patient did not think he needed the Doppler any longer.  Also discovered last week that he had been without his hydrochlorothiazide for about 30 to 45 days due to error with him picking up prescriptions at the pharmacy.  He has just resumed use of hydrochlorothiazide.  Still having some swelling in the right foot, and became concerned and thought maybe he did need to move forward and get that Doppler study performed that had been ordered in May.  Denies chest pain, denies shortness of  breath, denies palpitations.  Denies any cough.  No fever or chills.  Denies GI or GU issues.   ROS: See pertinent positives and negatives per HPI.  Past Medical History:  Diagnosis Date  . Acute postoperative pain 11/24/2016  . Anxiety   . Chronic hip pain (Right) 12/05/2014  . Chronic lumbar pain   . Depression   . Hyperlipidemia   . Hypertension   . Kidney stones   . Migraines     Past Surgical History:  Procedure Laterality Date  . right hip surgery     4 surgeries  . TONSILLECTOMY    . TOTAL HIP ARTHROPLASTY      Family History  Problem Relation Age of Onset  . Cancer Mother   . Heart disease Father   . Stroke Father    Social History   Tobacco Use  . Smoking status: Former Research scientist (life sciences)  . Smokeless tobacco: Never Used  Substance Use Topics  . Alcohol use: No    Alcohol/week: 0.0 standard drinks     Current Outpatient Medications:  .  ACCU-CHEK FASTCLIX LANCETS MISC, CHECK 4 TIMES A DAY, Disp: 102 each, Rfl: 2 .  acetaminophen (TYLENOL) 500 MG tablet, Take 500 mg by mouth every 6 (six) hours as needed., Disp: , Rfl:  .  ALPRAZolam (XANAX) 1 MG tablet, TAKE 0.5 TABLETS (0.5 MG TOTAL) BY MOUTH 2 (TWO) TIMES DAILY AS NEEDED FOR ANXIETY., Disp: 30 tablet, Rfl: 0 .  amLODipine (NORVASC) 5 MG tablet, TAKE 1 TABLET BY MOUTH EVERY DAY, Disp: 90 tablet, Rfl: 3 .  aspirin  EC 81 MG tablet, Take 81 mg by mouth daily., Disp: , Rfl:  .  BD PEN NEEDLE NANO U/F 32G X 4 MM MISC, USE EVERY DAY, Disp: 100 each, Rfl: 4 .  blood glucose meter kit and supplies KIT, Accu-Chek guide meter and Accu-Chek guide test strips with Accu-Chek fast clix lancets, check blood sugars 4 times daily, ICD 10 code E11.9, Disp: 1 each, Rfl: 0 .  carvedilol (COREG) 25 MG tablet, Take 1 tablet (25 mg total) by mouth 2 (two) times daily., Disp: 180 tablet, Rfl: 1 .  Continuous Blood Gluc Receiver (FREESTYLE LIBRE 14 DAY READER) DEVI, 1 Device by Does not apply route daily. Use to scan to check blood sugar up  to 7 times daily; E11.42,, Disp: 1 Device, Rfl: 1 .  Continuous Blood Gluc Sensor (FREESTYLE LIBRE 14 DAY SENSOR) MISC, 1 Device by Does not apply route every 14 (fourteen) days. E11.42, Disp: 6 each, Rfl: 3 .  cyclobenzaprine (FLEXERIL) 10 MG tablet, Take 1 tablet (10 mg total) by mouth 3 (three) times daily as needed for muscle spasms. Must last 30 days., Disp: 90 tablet, Rfl: 2 .  desoximetasone (TOPICORT) 0.25 % cream, APPLY CREAM TO AFFECTED AREA TWO TIMES DAILY, for up to 7 days, do not apply to face, Disp: 30 g, Rfl: 0 .  empagliflozin (JARDIANCE) 25 MG TABS tablet, TAKE 25 MG BY MOUTH DAILY., Disp: 30 tablet, Rfl: 3 .  escitalopram (LEXAPRO) 10 MG tablet, TAKE 1 TABLET (10 MG TOTAL) BY MOUTH DAILY. START THE DAY AFTER STOPPING PROZAC., Disp: 90 tablet, Rfl: 1 .  glucose blood (ACCU-CHEK GUIDE) test strip, Use as instructed to check sugar up to 7 times daily, Disp: 300 each, Rfl: 5 .  hydrochlorothiazide (HYDRODIURIL) 25 MG tablet, Take 1 tablet (25 mg total) by mouth daily., Disp: 90 tablet, Rfl: 1 .  hydrOXYzine (ATARAX/VISTARIL) 50 MG tablet, TAKE 1 TABLET BY MOUTH FOUR TIMES A DAY AS NEEDED, Disp: 90 tablet, Rfl: 1 .  ibuprofen (ADVIL) 600 MG tablet, TAKE 1 TABLET (600 MG TOTAL) BY MOUTH EVERY 8 (EIGHT) HOURS AS NEEDED FOR MILD PAIN. TAKE WITH FOOD., Disp: 20 tablet, Rfl: 0 .  insulin degludec (TRESIBA FLEXTOUCH) 100 UNIT/ML SOPN FlexTouch Pen, INJECT 40 UNITS INTO THE SKIN DAILY AT 10 PM., Disp: 15 pen, Rfl: 3 .  insulin lispro (HUMALOG KWIKPEN) 100 UNIT/ML KwikPen, INJECT 0.12-0.18 MLS (12-18 UNITS TOTAL) INTO THE SKIN 2 (TWO) TIMES DAILY WITH A MEAL., Disp: 15 pen, Rfl: 2 .  loratadine (CLARITIN) 10 MG tablet, Take 1 tablet (10 mg total) by mouth daily., Disp: 30 tablet, Rfl: 11 .  magnesium 30 MG tablet, Take 1 tablet (30 mg total) by mouth 2 (two) times daily., Disp: 60 tablet, Rfl: 3 .  metFORMIN (GLUCOPHAGE) 1000 MG tablet, TAKE 1 TABLET (1,000 MG TOTAL) BY MOUTH 2 (TWO) TIMES DAILY  WITH A MEAL., Disp: 180 tablet, Rfl: 2 .  mometasone (NASONEX) 50 MCG/ACT nasal spray, Place 2 sprays into the nose daily., Disp: 17 g, Rfl: 4 .  naloxone (NARCAN) 2 MG/2ML injection, Inject content of syringe into thigh muscle. Call 911., Disp: 2 Syringe, Rfl: 1 .  olmesartan (BENICAR) 40 MG tablet, TAKE 1 TABLET BY MOUTH EVERY DAY, Disp: 90 tablet, Rfl: 2 .  [START ON 08/15/2018] Oxycodone HCl 10 MG TABS, Take 1 tablet (10 mg total) by mouth every 6 (six) hours as needed for up to 30 days., Disp: 120 tablet, Rfl: 0 .  [START ON 07/16/2018] Oxycodone  HCl 10 MG TABS, Take 1 tablet (10 mg total) by mouth every 6 (six) hours as needed for up to 30 days., Disp: 120 tablet, Rfl: 0 .  Oxycodone HCl 10 MG TABS, Take 1 tablet (10 mg total) by mouth every 6 (six) hours as needed for up to 30 days., Disp: 120 tablet, Rfl: 0 .  pantoprazole (PROTONIX) 40 MG tablet, Take 1 tablet (40 mg total) by mouth daily., Disp: 90 tablet, Rfl: 1 .  pravastatin (PRAVACHOL) 40 MG tablet, TAKE 1 TABLET BY MOUTH EVERY DAY, Disp: 90 tablet, Rfl: 1 .  tamsulosin (FLOMAX) 0.4 MG CAPS capsule, TAKE 1 CAPSULE (0.4 MG TOTAL) BY MOUTH 2 (TWO) TIMES DAILY., Disp: 180 capsule, Rfl: 2 .  VICTOZA 18 MG/3ML SOPN, INJECT 0.3 MLS (1.8 MG TOTAL) INTO THE SKIN DAILY., Disp: 27 pen, Rfl: 1  EXAM:  GENERAL: alert, oriented, appears well and in no acute distress  HEENT: atraumatic, conjunttiva clear, no obvious abnormalities on inspection of external nose and ears  NECK: normal movements of the head and neck  LUNGS: on inspection no signs of respiratory distress, breathing rate appears normal, no obvious gross SOB, gasping or wheezing  LE: RLE does appear swollen when compared to the LLE. RLE +2, some pitting  CV: no obvious cyanosis  MS: moves all visible extremities without noticeable abnormality  PSYCH/NEURO: pleasant and cooperative, no obvious depression or anxiety, speech and thought processing grossly intact  ASSESSMENT AND  PLAN:  Discussed the following assessment and plan:  Swelling of right lower extremity  Patient now agreeable to get Doppler study.  We will get this rescheduled for him.  He will also continue taking the hydrochlorothiazide daily.  I wonder if him not taking the hydrochlorothiazide is causing him to have some of the lower extremity swelling.  Once we have results of Doppler we will better be able to know next step in plan of care.  Patient also advised to keep legs elevated whenever able to help combat development of lower extremity edema.   I discussed the assessment and treatment plan with the patient. The patient was provided an opportunity to ask questions and all were answered. The patient agreed with the plan and demonstrated an understanding of the instructions.   The patient was advised to call back or seek an in-person evaluation if the symptoms worsen or if the condition fails to improve as anticipated.  Jodelle Green, FNP

## 2018-07-05 NOTE — Telephone Encounter (Signed)
Scheduled patient for Virtual FYI

## 2018-07-05 NOTE — Telephone Encounter (Signed)
Want to know the status of the addendum that was sent 5.21.20. please call to advise

## 2018-07-06 ENCOUNTER — Ambulatory Visit: Payer: Medicare Other

## 2018-07-07 ENCOUNTER — Other Ambulatory Visit: Payer: Self-pay

## 2018-07-07 ENCOUNTER — Telehealth: Payer: Self-pay | Admitting: Family Medicine

## 2018-07-07 ENCOUNTER — Ambulatory Visit
Admission: RE | Admit: 2018-07-07 | Discharge: 2018-07-07 | Disposition: A | Discharge status: Home / Self Care | Payer: Medicare Other | Source: Ambulatory Visit | Attending: Family Medicine | Admitting: Family Medicine

## 2018-07-07 DIAGNOSIS — M7989 Other specified soft tissue disorders: Secondary | ICD-10-CM | POA: Insufficient documentation

## 2018-07-07 DIAGNOSIS — R600 Localized edema: Secondary | ICD-10-CM | POA: Diagnosis not present

## 2018-07-07 NOTE — Telephone Encounter (Signed)
Copied from [place] [id number]. Topic: General - Other >> Jul 07, 2018  2:36 PM Keene Breath wrote: Reason for CRM: Anderson Malta in Ultrasound called to inform the doctor that the patient's ultrasound was negative.  If there are any other questions, please call back at [phone]

## 2018-07-08 NOTE — Telephone Encounter (Signed)
Anderson Malta from the Ultrasound Dept calleReason for CRM:  Anderson Malta in Ultrasound called to inform the doctor that the patient's ultrasound was negative.  If there are any other questions, please call back at [phone] d the [facility] and stated

## 2018-07-08 NOTE — Telephone Encounter (Signed)
Yes I see the results, thanks!

## 2018-07-10 NOTE — Telephone Encounter (Signed)
Signed. Please fax.

## 2018-07-11 ENCOUNTER — Telehealth: Payer: Self-pay | Admitting: Pharmacist

## 2018-07-11 DIAGNOSIS — Z794 Long term (current) use of insulin: Secondary | ICD-10-CM | POA: Diagnosis not present

## 2018-07-11 DIAGNOSIS — E118 Type 2 diabetes mellitus with unspecified complications: Secondary | ICD-10-CM | POA: Diagnosis not present

## 2018-07-11 NOTE — Telephone Encounter (Signed)
Contacted [facility] to follow up on status of CGM order.   They are awaiting the addendum to be faxed back. I can see that Dr. [name] routed this to Kerin Salen to fax in today. Once that is faxed in, I will follow up with [facility] to ensure it was received.   The representative also went ahead and set up a reorder reminder to go out to the patient 10/11/2018.

## 2018-07-11 NOTE — Telephone Encounter (Signed)
Paperwork and notes have been faxed as requested,

## 2018-07-11 NOTE — Telephone Encounter (Signed)
Completed today 

## 2018-07-11 NOTE — Telephone Encounter (Signed)
This has been completed by PCP CMA Clarisse Gouge.

## 2018-07-14 NOTE — Telephone Encounter (Signed)
Contacted [facility] to follow up on status of FreeStyle Libre order for this patient.   They noted that the order was processed and finalized on 07/11/2018 and should have arrived to patient on 07/13/2018.   Contacted patient to confirm that he received the meter and to schedule time for him to come into office for teaching. Left HIPAA compliant message on both home and cell number asking him to return my call.   [name], PharmD, [place] PGY2 Ambulatory Care Pharmacy Resident, [facility] Phone: [phone]

## 2018-07-14 NOTE — Telephone Encounter (Signed)
Received call back from patient confirming that he received the meter and sensors.   Contacted patient, had to leave a message again. Offered for him to come in to clinic (or come to the parking lot) on a Monday for me to demonstrate placement and use of Freestyle Libre CGM. He will let me know what time/date works for him.   [name], PharmD, [place] PGY2 Ambulatory Care Pharmacy Resident, [place] [facility] Phone: [phone]

## 2018-07-27 ENCOUNTER — Other Ambulatory Visit: Payer: Self-pay | Admitting: Family

## 2018-07-27 ENCOUNTER — Telehealth: Payer: Self-pay | Admitting: Family Medicine

## 2018-08-01 ENCOUNTER — Telehealth: Payer: Self-pay

## 2018-08-01 MED ORDER — IBUPROFEN 600 MG PO TABS: 600.0000 mg | ORAL_TABLET | Freq: Three times a day (TID) | ORAL | 0 refills | Status: DC | PRN

## 2018-08-01 NOTE — Telephone Encounter (Signed)
Pt following up on this request, and does not know why not refilled.  ALPRAZolam Duanne Moron) 1 MG tablet  [id number] [address], [place] (Phone) [phone] (Fax)

## 2018-08-01 NOTE — Telephone Encounter (Signed)
Pt following up on this request, and does not know why not refilled.  ibuprofen (ADVIL) 600 MG tablet  [id number] [address], [place] (Phone) [phone] (Fax)

## 2018-08-01 NOTE — Telephone Encounter (Signed)
Sent to pharmacy 

## 2018-08-10 DIAGNOSIS — E118 Type 2 diabetes mellitus with unspecified complications: Secondary | ICD-10-CM | POA: Diagnosis not present

## 2018-08-10 DIAGNOSIS — Z794 Long term (current) use of insulin: Secondary | ICD-10-CM | POA: Diagnosis not present

## 2018-08-11 ENCOUNTER — Ambulatory Visit: Payer: Self-pay | Admitting: Pharmacist

## 2018-08-11 DIAGNOSIS — E1142 Type 2 diabetes mellitus with diabetic polyneuropathy: Secondary | ICD-10-CM

## 2018-08-11 DIAGNOSIS — I10 Essential (primary) hypertension: Secondary | ICD-10-CM

## 2018-08-11 DIAGNOSIS — N401 Enlarged prostate with lower urinary tract symptoms: Secondary | ICD-10-CM

## 2018-08-11 DIAGNOSIS — E785 Hyperlipidemia, unspecified: Secondary | ICD-10-CM

## 2018-08-11 DIAGNOSIS — R3914 Feeling of incomplete bladder emptying: Secondary | ICD-10-CM

## 2018-08-11 NOTE — Progress Notes (Signed)
  Care Management Note   Alan Schmidt is a 73 y.o. year old male who is a primary care patient of [name], [name], MD. The CM team was consulted for assistance with chronic disease management and care coordination.   I reached out to [name] by phone today. We discussed that he is ecstatic to be using his FreeStyle Libre CGM, and loves being able to keep a close eye on his sugars. He reports an average BG of 142. I sent him the email link to connect with my LibreView account.  [name] was given information about Chronic Care Management services today including:  1. CCM service includes personalized support from designated clinical staff supervised by his physician, including individualized plan of care and coordination with other care providers 2. 24/7 contact phone numbers for assistance for urgent and routine care needs. 3. Service will only be billed when office clinical staff spend 20 minutes or more in a month to coordinate care. 4. Only one practitioner may furnish and bill the service in a calendar month. 5. The patient may stop CCM services at any time (effective at the end of the month) by phone call to the office staff. 6. The patient will be responsible for cost sharing (co-pay) of up to 20% of the service fee (after annual deductible is met).  Patient agreed to services and verbal consent obtained.    Review of patient status, including review of consultants reports, relevant laboratory and other test results, and collaboration with appropriate care team members and the patient's provider was performed as part of comprehensive patient evaluation and provision of chronic care management services.   Follow Up Plan:  - Will outreach patient in the next 3-4 weeks for continued medication management support  [name], PharmD, [place] Pharmacist [place] [phone]

## 2018-08-11 NOTE — Patient Instructions (Signed)
Visit Information   [name] was given information about Chronic Care Management services today including:  1. CCM service includes personalized support from designated clinical staff supervised by his physician, including individualized plan of care and coordination with other care providers 2. 24/7 contact phone numbers for assistance for urgent and routine care needs. 3. Service will only be billed when office clinical staff spend 20 minutes or more in a month to coordinate care. 4. Only one practitioner may furnish and bill the service in a calendar month. 5. The patient may stop CCM services at any time (effective at the end of the month) by phone call to the office staff. 6. The patient will be responsible for cost sharing (co-pay) of up to 20% of the service fee (after annual deductible is met).  Patient agreed to services and verbal consent obtained.   The patient verbalized understanding of instructions provided today and declined a print copy of patient instruction materials.  Follow Up Plan:  - Will outreach patient in the next 3-4 weeks for continued medication management support  [name], PharmD, [place] Pharmacist [place] [phone]

## 2018-08-17 ENCOUNTER — Ambulatory Visit: Payer: Medicare Other | Admitting: Family Medicine

## 2018-08-25 ENCOUNTER — Telehealth: Payer: Medicare Other

## 2018-08-25 ENCOUNTER — Ambulatory Visit: Payer: Medicare Other | Admitting: Pharmacist

## 2018-08-25 ENCOUNTER — Other Ambulatory Visit: Payer: Self-pay | Admitting: Family Medicine

## 2018-08-25 DIAGNOSIS — Z794 Long term (current) use of insulin: Secondary | ICD-10-CM

## 2018-08-25 DIAGNOSIS — E1142 Type 2 diabetes mellitus with diabetic polyneuropathy: Secondary | ICD-10-CM

## 2018-08-25 NOTE — Chronic Care Management (AMB) (Signed)
Chronic Care Management   Note  08/25/2018 Name: BAYDEN GIL MRN: [id number] DOB: 10-Feb-1945   Subjective:  DECKLIN WEDDINGTON is a 73 y.o. year old male who is a primary care patient of [name], [name], MD. The CCM team was consulted for assistance with chronic disease management and care coordination needs.    Received call from patient with questions about his FreeStyle Libre CGM   Review of patient status, including review of consultants reports, laboratory and other test data, was performed as part of comprehensive evaluation and provision of chronic care management services.   Objective:  Lab Results  Component Value Date   CREATININE 0.94 06/10/2018   CREATININE 1.15 05/03/2018   CREATININE 1.00 03/26/2018    Lab Results  Component Value Date   HGBA1C 8.2 (H) 05/03/2018       Component Value Date/Time   CHOL 120 09/27/2017 0909   TRIG 95.0 09/27/2017 0909   HDL 50.40 09/27/2017 0909   CHOLHDL 2 09/27/2017 0909   VLDL 19.0 09/27/2017 0909   LDLCALC 51 09/27/2017 0909    Clinical ASCVD: No    BP Readings from Last 3 Encounters:  05/03/18 (!) 148/80  03/26/18 (!) 139/100  03/15/18 (!) 144/72    Allergies  Allergen Reactions  . Contrast Media [Iodinated Diagnostic Agents] Swelling  . Iodine Swelling  . Shellfish Allergy Nausea And Vomiting and Swelling    Medications Reviewed Today    Reviewed by Neta Ehlers, RMA (Technician) on 07/05/18 at 1337  Med List Status: <None>  Medication Order Taking? Sig Documenting Provider Last Dose Status Informant  Andres Labrum LANCETS [place] [id number] Yes CHECK 4 TIMES A DAY [name], MD Taking Active   acetaminophen (TYLENOL) 500 MG tablet [id number] Yes Take 500 mg by mouth every 6 (six) hours as needed. [provider] Taking Active            Med Note Darnelle Maffucci, Waynette Buttery May 16, 2018  3:15 PM) PRN  ALPRAZolam Duanne Moron) 1 MG tablet [id number] Yes TAKE 0.5 TABLETS (0.5 MG TOTAL) BY  MOUTH 2 (TWO) TIMES DAILY AS NEEDED FOR ANXIETY. Burnard Hawthorne, FNP Taking Active   amLODipine (NORVASC) 5 MG tablet [id number] Yes TAKE 1 TABLET BY MOUTH EVERY DAY [name], MD Taking Active   aspirin EC 81 MG tablet [id number] Yes Take 81 mg by mouth daily. [provider] Taking Active   BD PEN NEEDLE NANO U/F 32G X 4 MM MISC [id number] Yes USE EVERY DAY [name], MD Taking Active   blood glucose meter kit and supplies KIT [id number] Yes Accu-Chek guide meter and Accu-Chek guide test strips with Accu-Chek fast clix lancets, check blood sugars 4 times daily, ICD 10 code E11.9 [name], MD Taking Active   carvedilol (COREG) 25 MG tablet [id number] Yes Take 1 tablet (25 mg total) by mouth 2 (two) times daily. [name], MD Taking Active   Continuous Blood Gluc Receiver (FREESTYLE LIBRE 14 DAY READER) [place] [id number] Yes 1 Device by Does not apply route daily. Use to scan to check blood sugar up to 7 times daily; E11.42, [name], MD Taking Active   Continuous Blood Gluc Sensor (FREESTYLE LIBRE [place]) [place] [id number] Yes 1 Device by Does not apply route every 14 (fourteen) days. E11.42 [name], MD Taking Active   cyclobenzaprine (FLEXERIL) 10 MG tablet [id number] Yes Take 1 tablet (10 mg total) by mouth 3 (three)  times daily as needed for muscle spasms. Must last 30 days. Vevelyn Francois, NP Taking Active   desoximetasone (TOPICORT) 0.25 % cream [id number] Yes APPLY CREAM TO AFFECTED AREA TWO TIMES DAILY, for up to 7 days, do not apply to face [name], MD Taking Active   empagliflozin (JARDIANCE) 25 MG TABS tablet [id number] Yes TAKE 25 MG BY MOUTH DAILY. [name], MD Taking Active   escitalopram (LEXAPRO) 10 MG tablet [id number] Yes TAKE 1 TABLET (10 MG TOTAL) BY MOUTH DAILY. START THE DAY AFTER STOPPING PROZAC. [name], MD Taking Active   glucose blood (ACCU-CHEK GUIDE) test strip [id number] Yes Use as  instructed to check sugar up to 7 times daily [name], MD Taking Active   hydrochlorothiazide (HYDRODIURIL) 25 MG tablet [id number] Yes Take 1 tablet (25 mg total) by mouth daily. [name], MD Taking Active   hydrOXYzine (ATARAX/VISTARIL) 50 MG tablet [id number] Yes TAKE 1 TABLET BY MOUTH FOUR TIMES A DAY AS NEEDED [name], MD Taking Active   ibuprofen (ADVIL) 600 MG tablet [id number] Yes TAKE 1 TABLET (600 MG TOTAL) BY MOUTH EVERY 8 (EIGHT) HOURS AS NEEDED FOR MILD PAIN. TAKE WITH FOOD. [name], MD Taking Active   insulin degludec (TRESIBA FLEXTOUCH) 100 UNIT/ML SOPN FlexTouch Pen [id number] Yes INJECT 40 UNITS INTO THE SKIN DAILY AT 10 PM. [name], MD Taking Active            Med Note Darnelle Maffucci, Juliahna Wiswell E   Mon May 16, 2018  3:12 PM) 46 units daily  insulin lispro (HUMALOG KWIKPEN) 100 UNIT/ML KwikPen [id number] Yes INJECT 0.12-0.18 MLS (12-18 UNITS TOTAL) INTO THE SKIN 2 (TWO) TIMES DAILY WITH A MEAL. [name], MD Taking Active   loratadine (CLARITIN) 10 MG tablet [id number] Yes Take 1 tablet (10 mg total) by mouth daily. [name], MD Taking Active   magnesium 30 MG tablet [id number] Yes Take 1 tablet (30 mg total) by mouth 2 (two) times daily. [name], MD Taking Active   metFORMIN (GLUCOPHAGE) 1000 MG tablet [id number] Yes TAKE 1 TABLET (1,000 MG TOTAL) BY MOUTH 2 (TWO) TIMES DAILY WITH A MEAL. [name], MD Taking Active   mometasone (NASONEX) 50 MCG/ACT nasal spray [id number] Yes Place 2 sprays into the nose daily. [name], MD Taking Active   naloxone [facility]) 2 MG/2ML injection [id number] Yes Inject content of syringe into thigh muscle. Call 911. Milinda Pointer, MD Taking Active   olmesartan [facility]) 40 MG tablet [id number] Yes TAKE 1 TABLET BY MOUTH EVERY DAY [name], MD Taking Active   Oxycodone HCl 10 MG TABS [id number] Yes Take 1 tablet (10 mg total) by mouth every 6 (six) hours as  needed for up to 30 days. Vevelyn Francois, NP Taking Active   Oxycodone HCl 10 MG TABS [id number] Yes Take 1 tablet (10 mg total) by mouth every 6 (six) hours as needed for up to 30 days. Vevelyn Francois, NP Taking Active   Oxycodone HCl 10 MG TABS [id number] Yes Take 1 tablet (10 mg total) by mouth every 6 (six) hours as needed for up to 30 days. Vevelyn Francois, NP Taking Active   pantoprazole (PROTONIX) 40 MG tablet [id number] Yes Take 1 tablet (40 mg total) by mouth daily. [name], MD Taking Active   pravastatin (PRAVACHOL) 40 MG tablet [id number] Yes TAKE 1 TABLET BY MOUTH EVERY DAY [name], MD Taking  Active   tamsulosin (FLOMAX) 0.4 MG CAPS capsule [id number] Yes TAKE 1 CAPSULE (0.4 MG TOTAL) BY MOUTH 2 (TWO) TIMES DAILY. [name], MD Taking Active   VICTOZA 18 MG/3ML SOPN [id number] Yes INJECT 0.3 MLS (1.8 MG TOTAL) INTO THE SKIN DAILY. [name], MD Taking Active   Med List Note Chauncey Fischer, RN 03/03/18 1035): UDS 03-03-18 MR 05/14/17           Assessment:   Goals Addressed            This Visit's Progress     Patient Stated   . "I want to keep an eye on my diabetes" (pt-stated)       Current Barriers:  . Diabetes: uncontrolled; most recent A1c 8.2%. He notes improved control with use of FreeStyle Libre CGM that he has been using recently. Unfortunately, he lost the reader and is unsure how to get a new one.  . Current antihyperglycemic regimen: metformin 1000 mg BID, Jardiance 25 mg daily, Victoza 1.8 mg daily, Tresiba 64 units daily, Humalog 2-10 units with meals 3x daily . Cardiovascular risk reduction: o Current hypertensive regimen: amlodipine 5 mg daily, carvedilol 25 mg BID, HCTZ 25 mg daily, olmesartan 40 mg daily;  o Current hyperlipidemia regimen: pravastatin 40 mg daily; LDL well controlled last check <70  Pharmacist Clinical Goal(s):  Marland Kitchen Over the next 90 days, patient with work with PharmD and primary care provider to address  optimized diabetes management  Interventions: . Educated patient about the [employer] that replaces reader, as he has a Stage manager. He downloaded, and will use to set up a new sensor. He will contact me with questions/concerns . Resent link for my practice portal to allow me to virtually review blood sugar results . Encouraged patient to contact [employer] [employer] supplier to see if they can provide a new reader  Patient Self Care Activities:  . Patient will check blood glucose with CGM, document, and provide at future appointment . Patient will take medications as prescribed . Patient will report any questions or concerns to provider   Initial goal documentation        Plan: - Will outreach patient next week to see if he was able to set up the [place], PharmD, [place] Pharmacist [employer] [phone]

## 2018-08-25 NOTE — Patient Instructions (Signed)
Visit Information  Goals Addressed            This Visit's Progress     Patient Stated   . "I want to keep an eye on my diabetes" (pt-stated)       Current Barriers:  . Diabetes: uncontrolled; most recent A1c 8.2%. He notes improved control with use of FreeStyle Libre CGM that he has been using recently. Unfortunately, he lost the reader and is unsure how to get a new one.  . Current antihyperglycemic regimen: metformin 1000 mg BID, Jardiance 25 mg daily, Victoza 1.8 mg daily, Tresiba 64 units daily, Humalog 2-10 units with meals 3x daily . Cardiovascular risk reduction: o Current hypertensive regimen: amlodipine 5 mg daily, carvedilol 25 mg BID, HCTZ 25 mg daily, olmesartan 40 mg daily;  o Current hyperlipidemia regimen: pravastatin 40 mg daily; LDL well controlled last check <70  Pharmacist Clinical Goal(s):  Marland Kitchen Over the next 90 days, patient with work with PharmD and primary care provider to address optimized diabetes management  Interventions: . Educated patient about the [employer] that replaces reader, as he has a Stage manager. He downloaded, and will use to set up a new sensor. He will contact me with questions/concerns . Resent link for my practice portal to allow me to virtually review blood sugar results . Encouraged patient to contact [employer] supplier to see if they can provide a new reader  Patient Self Care Activities:  . Patient will check blood glucose with CGM, document, and provide at future appointment . Patient will take medications as prescribed . Patient will report any questions or concerns to provider   Initial goal documentation        The patient verbalized understanding of instructions provided today and declined a print copy of patient instruction materials.   Plan: - Will outreach patient next week to see if he was able to set up the [place], PharmD, [place] Pharmacist [employer] [phone]

## 2018-08-29 ENCOUNTER — Other Ambulatory Visit: Payer: Self-pay | Admitting: Family Medicine

## 2018-08-29 ENCOUNTER — Ambulatory Visit: Payer: Medicare Other | Admitting: Pharmacist

## 2018-08-29 ENCOUNTER — Telehealth: Payer: Medicare Other

## 2018-08-29 DIAGNOSIS — E1142 Type 2 diabetes mellitus with diabetic polyneuropathy: Secondary | ICD-10-CM

## 2018-08-29 DIAGNOSIS — Z794 Long term (current) use of insulin: Secondary | ICD-10-CM

## 2018-08-29 NOTE — Patient Instructions (Signed)
Visit Information  Goals Addressed            This Visit's Progress     Patient Stated   . "I want to keep an eye on my diabetes" (pt-stated)       Current Barriers:  . Diabetes: uncontrolled; most recent A1c 8.2%. He notes improved control with use of FreeStyle Libre CGM that he has been using recently.  . Notes that he was able to set up the [place] app and has been checking  . Current antihyperglycemic regimen: metformin 1000 mg BID, Jardiance 25 mg daily, Victoza 1.8 mg daily, Tresiba 64 units daily, Humalog 2-10 units with meals 3x daily . Cardiovascular risk reduction: o Current hypertensive regimen: amlodipine 5 mg daily, carvedilol 25 mg BID, HCTZ 25 mg daily, olmesartan 40 mg daily;  o Current hyperlipidemia regimen: pravastatin 40 mg daily; LDL well controlled last check <70  Pharmacist Clinical Goal(s):  Marland Kitchen Over the next 90 days, patient with work with PharmD and primary care provider to address optimized diabetes management  Interventions: . Resent link for my practice portal to allow me to virtually review blood sugar results. Verified his email address. Encouraged patient to check his junk mail folder in his email for this communication.   Patient Self Care Activities:  . Patient will check blood glucose with CGM, document, and provide at future appointment . Patient will take medications as prescribed . Patient will report any questions or concerns to provider   Please see past updates related to this goal by clicking on the "Past Updates" button in the selected goal         The patient verbalized understanding of instructions provided today and declined a print copy of patient instruction materials.   Plan:  - Patient will check his junk email and connect to my patient portal on [place] will outreach patient in 3-4 weeks to review BG results and discuss medication management  [name], PharmD, [place] Pharmacist [place] [phone]

## 2018-08-29 NOTE — Chronic Care Management (AMB) (Signed)
  Chronic Care Management   Follow Up Note   08/29/2018 Name: Alan Schmidt MRN: [id number] DOB: [birth year]  Referred by: [name], MD Reason for referral : Chronic Care Management (Medication Management)   Alan Schmidt is a 73 y.o. year old male who is a primary care patient of [name], [name], MD. The CCM team was consulted for assistance with chronic disease management and care coordination needs.    Contacted patient today to follow up on setting up the [employer] app to utilize his [employer] CGM.  Review of patient status, including review of consultants reports, relevant laboratory and other test results, and collaboration with appropriate care team members and the patient's provider was performed as part of comprehensive patient evaluation and provision of chronic care management services.    Goals Addressed            This Visit's Progress     Patient Stated   . "I want to keep an eye on my diabetes" (pt-stated)       Current Barriers:  . Diabetes: uncontrolled; most recent A1c 8.2%. He notes improved control with use of FreeStyle Libre CGM that he has been using recently.  . Notes that he was able to set up the [place] app and has been checking  . Current antihyperglycemic regimen: metformin 1000 mg BID, Jardiance 25 mg daily, Victoza 1.8 mg daily, Tresiba 64 units daily, Humalog 2-10 units with meals 3x daily . Cardiovascular risk reduction: o Current hypertensive regimen: amlodipine 5 mg daily, carvedilol 25 mg BID, HCTZ 25 mg daily, olmesartan 40 mg daily;  o Current hyperlipidemia regimen: pravastatin 40 mg daily; LDL well controlled last check <70  Pharmacist Clinical Goal(s):  Marland Kitchen Over the next 90 days, patient with work with PharmD and primary care provider to address optimized diabetes management  Interventions: . Resent link for my practice portal to allow me to virtually review blood sugar results. Verified his email address.  Encouraged patient to check his junk mail folder in his email for this communication.   Patient Self Care Activities:  . Patient will check blood glucose with CGM, document, and provide at future appointment . Patient will take medications as prescribed . Patient will report any questions or concerns to provider   Please see past updates related to this goal by clicking on the "Past Updates" button in the selected goal          Plan:  - Patient will check his junk email and connect to my patient portal on [place] - PharmD will outreach patient in 3-4 weeks to review BG results and discuss medication management  [name], PharmD, [place] Pharmacist [place] [phone]

## 2018-09-01 ENCOUNTER — Ambulatory Visit: Payer: Self-pay | Admitting: Pharmacist

## 2018-09-01 DIAGNOSIS — E1142 Type 2 diabetes mellitus with diabetic polyneuropathy: Secondary | ICD-10-CM

## 2018-09-01 DIAGNOSIS — Z794 Long term (current) use of insulin: Secondary | ICD-10-CM

## 2018-09-01 NOTE — Chronic Care Management (AMB) (Signed)
  Chronic Care Management   Follow Up Note   09/01/2018 Name: Alan Schmidt MRN: [id number] DOB: [birth year]  Referred by: [name], MD Reason for referral : Chronic Care Management (Medication Management)   Alan Schmidt is a 73 y.o. year old male who is a primary care patient of [name], [name], MD. The CCM team was consulted for assistance with chronic disease management and care coordination needs.    Review of patient status, including review of consultants reports, relevant laboratory and other test results, and collaboration with appropriate care team members and the patient's provider was performed as part of comprehensive patient evaluation and provision of chronic care management services.    Objective:     Goals Addressed            This Visit's Progress     Patient Stated   . "I want to keep an eye on my diabetes" (pt-stated)       Current Barriers:  . Diabetes: uncontrolled; most recent A1c 8.2%.  . Notes improved control with use of FreeStyle Libre CGM; lost the readers, but was able to download the [place] app on his smart phone to use as a reader, and was able to connect with my Patient Portal on the [employer]. . Current antihyperglycemic regimen: metformin 1000 mg BID, Jardiance 25 mg daily, Victoza 1.8 mg daily, Tresiba 64 units daily, Humalog 2-10 units with meals 3x daily . Cardiovascular risk reduction: o Current hypertensive regimen: amlodipine 5 mg daily, carvedilol 25 mg BID, HCTZ 25 mg daily, olmesartan 40 mg daily;  o Current hyperlipidemia regimen: pravastatin 40 mg daily; LDL well controlled last check <70  Pharmacist Clinical Goal(s):  Marland Kitchen Over the next 90 days, patient with work with PharmD and primary care provider to address optimized diabetes management  Interventions: . Contacted patient, left message letting him know that I am able to review his blood sugars.  . Current control is fantastic (see  above). Will continue to monitor moving forward.   Patient Self Care Activities:  . Patient will check blood glucose with CGM, document, and provide at future appointment . Patient will take medications as prescribed . Patient will report any questions or concerns to provider   Please see past updates related to this goal by clicking on the "Past Updates" button in the selected goal          Plan:  - Follow up scheduled with patient in ~2-3 weeks for continued medication management support.   [name], PharmD, [place] Pharmacist [facility] [place] [phone]

## 2018-09-01 NOTE — Patient Instructions (Signed)
Visit Information  Goals Addressed            This Visit's Progress     Patient Stated   . "I want to keep an eye on my diabetes" (pt-stated)       Current Barriers:  . Diabetes: uncontrolled; most recent A1c 8.2%.  . Notes improved control with use of FreeStyle Libre CGM; lost the readers, but was able to download the [place] app on his smart phone to use as a reader, and was able to connect with my Patient Portal on the [employer]. . Current antihyperglycemic regimen: metformin 1000 mg BID, Jardiance 25 mg daily, Victoza 1.8 mg daily, Tresiba 64 units daily, Humalog 2-10 units with meals 3x daily . Cardiovascular risk reduction: o Current hypertensive regimen: amlodipine 5 mg daily, carvedilol 25 mg BID, HCTZ 25 mg daily, olmesartan 40 mg daily;  o Current hyperlipidemia regimen: pravastatin 40 mg daily; LDL well controlled last check <70  Pharmacist Clinical Goal(s):  Marland Kitchen Over the next 90 days, patient with work with PharmD and primary care provider to address optimized diabetes management  Interventions: . Contacted patient, left message letting him know that I am able to review his blood sugars.  . Current control is fantastic (see above). Will continue to monitor moving forward.   Patient Self Care Activities:  . Patient will check blood glucose with CGM, document, and provide at future appointment . Patient will take medications as prescribed . Patient will report any questions or concerns to provider   Please see past updates related to this goal by clicking on the "Past Updates" button in the selected goal         The patient verbalized understanding of instructions provided today and declined a print copy of patient instruction materials.  Plan:  - Follow up scheduled with patient in ~2-3 weeks for continued medication management support.   [name], PharmD, [place] Pharmacist [facility] [place] [phone]

## 2018-09-02 ENCOUNTER — Other Ambulatory Visit: Payer: Self-pay | Admitting: Family Medicine

## 2018-09-07 ENCOUNTER — Other Ambulatory Visit: Payer: Self-pay | Admitting: Family Medicine

## 2018-09-07 ENCOUNTER — Other Ambulatory Visit: Payer: Self-pay

## 2018-09-07 NOTE — Telephone Encounter (Signed)
Patient is requesting a refill on Lexapro Last fill date; 08/23/2018 Last OV:  Future OV: 09/09/2018

## 2018-09-08 ENCOUNTER — Ambulatory Visit: Payer: Self-pay | Admitting: Pharmacist

## 2018-09-08 DIAGNOSIS — E1142 Type 2 diabetes mellitus with diabetic polyneuropathy: Secondary | ICD-10-CM

## 2018-09-08 NOTE — Chronic Care Management (AMB) (Signed)
  Chronic Care Management   Follow Up Note   09/08/2018 Name: Alan Schmidt MRN: [id number] DOB: 1945-10-15  Referred by: [name], MD Reason for referral : Chronic Care Management (Medication Management)   Alan Schmidt is a 73 y.o. year old male who is a primary care patient of [name], [name], MD. The CCM team was consulted for assistance with chronic disease management and care coordination needs.    Patient called me with concerns about an episode of hyperglycemia the other day.   Review of patient status, including review of consultants reports, relevant laboratory and other test results, and collaboration with appropriate care team members and the patient's provider was performed as part of comprehensive patient evaluation and provision of chronic care management services.        Goals Addressed            This Visit's Progress     Patient Stated   . "I want to keep an eye on my diabetes" (pt-stated)       Current Barriers:  . Diabetes: uncontrolled; most recent A1c 8.2%.  . Improved control since downloading FreeStyle [place] Lathe to use as reader for [place] sensors  o Concerned about readings on Tuesday that were ~200s post prandial. Believes that his Humalog was not injecting correctly, so he tried a different pen needle. This worked better.  o Notes that he sees podiatry at [facility], but is concerned about going to that office during pandemic building given volume of patients, would like to speak with Dr. [name] tomorrow about alternative podiatry office referral . Current antihyperglycemic regimen: metformin 1000 mg BID, Jardiance 25 mg daily, Victoza 1.8 mg daily, Tresiba 64 units daily, Humalog 2-10 units with meals 3x daily . Current blood glucose readings: per CGM download of the past 2 weeks: o Average glucose 181; variability 23% (goal <36%); estimated A1c 6.7% o Glucose in range 80-180s 87% of the time, above range 181-250 13% of the  time  . Cardiovascular risk reduction: o Current hypertensive regimen: amlodipine 5 mg daily, carvedilol 25 mg BID, HCTZ 25 mg daily, olmesartan 40 mg daily; Patient notes he was previously on magnesium supplementation, but has been out of this for a few months. Wonders if he needs to restart. o Current hyperlipidemia regimen: pravastatin 40 mg daily; LDL well controlled last check <70  Pharmacist Clinical Goal(s):  Marland Kitchen Over the next 90 days, patient with work with PharmD and primary care provider to address optimized diabetes management  Interventions: . Congratulated patient on fantastic blood sugar control with CGM. Encouraged continued scans.  . Discussed sliding scale regimen. Recommended patient continue as he has been doing, given impeccable control. Patient due for labwork; will discuss with Dr. [name] during virtual visit tomorrow . Encouraged patient to discuss podiatry concerns and magnesium supplementation/levels with Dr. [name] tomorrow at his virtual visit.   Patient Self Care Activities:  . Patient will check blood glucose with CGM, document, and provide at future appointment . Patient will take medications as prescribed . Patient will report any questions or concerns to provider   Please see past updates related to this goal by clicking on the "Past Updates" button in the selected goal          Plan:  - Will outreach patient in 4-5 weeks for continued medication management support  [name], PharmD, [place] Pharmacist [facility] [phone]

## 2018-09-08 NOTE — Patient Instructions (Signed)
Visit Information  Goals Addressed            This Visit's Progress     Patient Stated   . "I want to keep an eye on my diabetes" (pt-stated)       Current Barriers:  . Diabetes: uncontrolled; most recent A1c 8.2%.  . Improved control since downloading FreeStyle [place] Lathe to use as reader for [place] sensors  o Concerned about readings on Tuesday that were ~200s post prandial. Believes that his Humalog was not injecting correctly, so he tried a different pen needle. This worked better.  o Notes that he sees podiatry at [facility], but is concerned about going to that office during pandemic building given volume of patients, would like to speak with Dr. [name] tomorrow about alternative podiatry office referral . Current antihyperglycemic regimen: metformin 1000 mg BID, Jardiance 25 mg daily, Victoza 1.8 mg daily, Tresiba 64 units daily, Humalog 2-10 units with meals 3x daily . Current blood glucose readings: per CGM download of the past 2 weeks: o Average glucose 181; variability 23% (goal <36%); estimated A1c 6.7% o Glucose in range 80-180s 87% of the time, above range 181-250 13% of the time  . Cardiovascular risk reduction: o Current hypertensive regimen: amlodipine 5 mg daily, carvedilol 25 mg BID, HCTZ 25 mg daily, olmesartan 40 mg daily; Patient notes he was previously on magnesium supplementation, but has been out of this for a few months. Wonders if he needs to restart. o Current hyperlipidemia regimen: pravastatin 40 mg daily; LDL well controlled last check <70  Pharmacist Clinical Goal(s):  Marland Kitchen Over the next 90 days, patient with work with PharmD and primary care provider to address optimized diabetes management  Interventions: . Congratulated patient on fantastic blood sugar control with CGM. Encouraged continued scans.  . Discussed sliding scale regimen. Recommended patient continue as he has been doing, given impeccable control. Patient due for labwork; will discuss  with Dr. [name] during virtual visit tomorrow . Encouraged patient to discuss podiatry concerns and magnesium supplementation/levels with Dr. [name] tomorrow at his virtual visit.   Patient Self Care Activities:  . Patient will check blood glucose with CGM, document, and provide at future appointment . Patient will take medications as prescribed . Patient will report any questions or concerns to provider   Please see past updates related to this goal by clicking on the "Past Updates" button in the selected goal         The patient verbalized understanding of instructions provided today and declined a print copy of patient instruction materials.   Plan:  - Will outreach patient in 4-5 weeks for continued medication management support  [name], PharmD, [place] Pharmacist [facility] [address] [phone]

## 2018-09-09 ENCOUNTER — Ambulatory Visit (INDEPENDENT_AMBULATORY_CARE_PROVIDER_SITE_OTHER): Payer: Medicare Other | Admitting: Family Medicine

## 2018-09-09 ENCOUNTER — Other Ambulatory Visit: Payer: Self-pay

## 2018-09-09 ENCOUNTER — Encounter: Payer: Self-pay | Admitting: Family Medicine

## 2018-09-09 VITALS — Ht 72.0 in | Wt 260.0 lb

## 2018-09-09 DIAGNOSIS — Z794 Long term (current) use of insulin: Secondary | ICD-10-CM

## 2018-09-09 DIAGNOSIS — E1142 Type 2 diabetes mellitus with diabetic polyneuropathy: Secondary | ICD-10-CM

## 2018-09-09 DIAGNOSIS — K219 Gastro-esophageal reflux disease without esophagitis: Secondary | ICD-10-CM | POA: Diagnosis not present

## 2018-09-09 DIAGNOSIS — I10 Essential (primary) hypertension: Secondary | ICD-10-CM

## 2018-09-09 NOTE — Progress Notes (Signed)
Virtual Visit via video Note  This visit type was conducted due to national recommendations for restrictions regarding the COVID-19 pandemic (e.g. social distancing).  This format is felt to be most appropriate for this patient at this time.  All issues noted in this document were discussed and addressed.  No physical exam was performed (except for noted visual exam findings with Video Visits).   I connected with Alan Schmidt today at  8:00 AM EDT by a video enabled telemedicine application and verified that I am speaking with the correct person using two identifiers. Location patient: home Location provider: work Persons participating in the virtual visit: patient, provider  I discussed the limitations, risks, security and privacy concerns of performing an evaluation and management service by telephone and the availability of in person appointments. I also discussed with the patient that there may be a patient responsible charge related to this service. The patient expressed understanding and agreed to proceed.   Reason for visit: follow-up  HPI: HYPERTENSION  Disease Monitoring  Home BP Monitoring not checking blood pressures. Chest pain-no.    Dyspnea-no. Medications  Compliance-taking amlodipine, carvedilol, HCTZ, olmesartan.  Edema-much improved with resuming HCTZ.  Diabetes: Averaging 141 on CBGs.  Taking [place] 46 units daily, Humalog 5 to 10 units with each meal, metformin, and Victoza.  No polyuria or polydipsia.  No hypoglycemia.  He has not seen ophthalmology in the last year.  He does report issues with reading small print.  GERD: Patient notes rare reflux symptoms.  Typically occurs if he lays down shortly after eating.  He will take an antacid and symptoms with will resolve.  He takes Protonix.  No abdominal pain, blood in stool, or dysphagia.     ROS: See pertinent positives and negatives per HPI.  Past Medical History:  Diagnosis Date  . Acute  postoperative pain 11/24/2016  . Anxiety   . Chronic hip pain (Right) 12/05/2014  . Chronic lumbar pain   . Depression   . Hyperlipidemia   . Hypertension   . Kidney stones   . Migraines     Past Surgical History:  Procedure Laterality Date  . right hip surgery     4 surgeries  . TONSILLECTOMY    . TOTAL HIP ARTHROPLASTY      Family History  Problem Relation Age of Onset  . Cancer Mother   . Heart disease Father   . Stroke Father     SOCIAL HX: Former smoker.   Current Outpatient Medications:  .  acetaminophen (TYLENOL) 500 MG tablet, Take 500 mg by mouth every 6 (six) hours as needed., Disp: , Rfl:  .  ALPRAZolam (XANAX) 1 MG tablet, Take 0.5 tablets (0.5 mg total) by mouth 2 (two) times daily as needed for anxiety., Disp: 30 tablet, Rfl: 0 .  amLODipine (NORVASC) 5 MG tablet, TAKE 1 TABLET BY MOUTH EVERY DAY, Disp: 90 tablet, Rfl: 3 .  aspirin EC 81 MG tablet, Take 81 mg by mouth daily., Disp: , Rfl:  .  BD PEN NEEDLE NANO U/F 32G X 4 MM MISC, USE EVERY DAY, Disp: 100 each, Rfl: 4 .  carvedilol (COREG) 25 MG tablet, Take 1 tablet (25 mg total) by mouth 2 (two) times daily., Disp: 180 tablet, Rfl: 1 .  Continuous Blood Gluc Receiver (FREESTYLE LIBRE 14 DAY READER) DEVI, 1 Device by Does not apply route daily. Use to scan to check blood sugar up to 7 times daily; E11.42,, Disp: 1 Device, Rfl: 1 .  Continuous Blood Gluc Sensor (FREESTYLE LIBRE 14 DAY SENSOR) MISC, 1 Device by Does not apply route every 14 (fourteen) days. E11.42, Disp: 6 each, Rfl: 3 .  cyclobenzaprine (FLEXERIL) 10 MG tablet, Take 1 tablet (10 mg total) by mouth 3 (three) times daily as needed for muscle spasms. Must last 30 days., Disp: 90 tablet, Rfl: 2 .  desoximetasone (TOPICORT) 0.25 % cream, APPLY CREAM TO AFFECTED AREA TWO TIMES DAILY, FOR UP TO 7 DAYS, DO NOT APPLY TO FACE, Disp: 30 g, Rfl: 0 .  empagliflozin (JARDIANCE) 25 MG TABS tablet, TAKE 25 MG BY MOUTH DAILY., Disp: 30 tablet, Rfl: 3 .   escitalopram (LEXAPRO) 10 MG tablet, Take 1 tablet (10 mg total) by mouth daily., Disp: 30 tablet, Rfl: 5 .  hydrochlorothiazide (HYDRODIURIL) 25 MG tablet, Take 1 tablet (25 mg total) by mouth daily., Disp: 90 tablet, Rfl: 1 .  hydrOXYzine (ATARAX/VISTARIL) 50 MG tablet, TAKE 1 TABLET BY MOUTH FOUR TIMES A DAY AS NEEDED, Disp: 90 tablet, Rfl: 1 .  ibuprofen (ADVIL) 600 MG tablet, TAKE 1 TABLET (600 MG TOTAL) BY MOUTH EVERY 8 (EIGHT) HOURS AS NEEDED FOR MILD PAIN. TAKE WITH FOOD., Disp: 20 tablet, Rfl: 0 .  insulin degludec (TRESIBA FLEXTOUCH) 100 UNIT/ML SOPN FlexTouch Pen, INJECT 40 UNITS INTO THE SKIN DAILY AT 10 PM., Disp: 15 pen, Rfl: 3 .  insulin lispro (HUMALOG KWIKPEN) 100 UNIT/ML KwikPen, INJECT 0.12-0.18 MLS (12-18 UNITS TOTAL) INTO THE SKIN 2 (TWO) TIMES DAILY WITH A MEAL., Disp: 15 pen, Rfl: 2 .  loratadine (CLARITIN) 10 MG tablet, Take 1 tablet (10 mg total) by mouth daily., Disp: 30 tablet, Rfl: 11 .  magnesium 30 MG tablet, Take 1 tablet (30 mg total) by mouth 2 (two) times daily., Disp: 60 tablet, Rfl: 3 .  metFORMIN (GLUCOPHAGE) 1000 MG tablet, TAKE 1 TABLET (1,000 MG TOTAL) BY MOUTH 2 (TWO) TIMES DAILY WITH A MEAL., Disp: 60 tablet, Rfl: 5 .  mometasone (NASONEX) 50 MCG/ACT nasal spray, Place 2 sprays into the nose daily., Disp: 17 g, Rfl: 4 .  naloxone (NARCAN) 2 MG/2ML injection, Inject content of syringe into thigh muscle. Call 911., Disp: 2 Syringe, Rfl: 1 .  nortriptyline (PAMELOR) 10 MG capsule, Take 20 mg by mouth at bedtime. , Disp: , Rfl:  .  olmesartan (BENICAR) 40 MG tablet, TAKE 1 TABLET BY MOUTH EVERY DAY, Disp: 90 tablet, Rfl: 2 .  Oxycodone HCl 10 MG TABS, Take 1 tablet (10 mg total) by mouth every 6 (six) hours as needed for up to 30 days., Disp: 120 tablet, Rfl: 0 .  pantoprazole (PROTONIX) 40 MG tablet, Take 1 tablet (40 mg total) by mouth daily., Disp: 90 tablet, Rfl: 1 .  pravastatin (PRAVACHOL) 40 MG tablet, TAKE 1 TABLET BY MOUTH EVERY DAY, Disp: 90 tablet, Rfl:  1 .  tamsulosin (FLOMAX) 0.4 MG CAPS capsule, TAKE 1 CAPSULE (0.4 MG TOTAL) BY MOUTH 2 (TWO) TIMES DAILY., Disp: 180 capsule, Rfl: 2 .  VICTOZA 18 MG/3ML SOPN, INJECT 0.3 MLS (1.8 MG TOTAL) INTO THE SKIN DAILY., Disp: 27 pen, Rfl: 1  EXAM:  VITALS per patient if applicable: none  GENERAL: alert, oriented, appears well and in no acute distress  HEENT: atraumatic, conjunttiva clear, no obvious abnormalities on inspection of external nose and ears  NECK: normal movements of the head and neck  LUNGS: on inspection no signs of respiratory distress, breathing rate appears normal, no obvious gross SOB, gasping or wheezing  CV: no obvious cyanosis  MS: moves all visible extremities without noticeable abnormality  PSYCH/NEURO: pleasant and cooperative, no obvious depression or anxiety, speech and thought processing grossly intact  ASSESSMENT AND PLAN:  Discussed the following assessment and plan:  Essential hypertension He will come in for nurse BP check in about a month.  Continue current medication.  Check lab work.  GERD (gastroesophageal reflux disease) Continue Protonix.  Discussed not laying down after eating.  Diabetes ([place]) Much improved control.  He will continue his current regimen.  We will have him in for lab work.    I discussed the assessment and treatment plan with the patient. The patient was provided an opportunity to ask questions and all were answered. The patient agreed with the plan and demonstrated an understanding of the instructions.   The patient was advised to call back or seek an in-person evaluation if the symptoms worsen or if the condition fails to improve as anticipated.   [name], MD

## 2018-09-09 NOTE — Assessment & Plan Note (Signed)
Much improved control.  He will continue his current regimen.  We will have him in for lab work.

## 2018-09-09 NOTE — Assessment & Plan Note (Signed)
He will come in for nurse BP check in about a month.  Continue current medication.  Check lab work.

## 2018-09-09 NOTE — Assessment & Plan Note (Signed)
Continue Protonix.  Discussed not laying down after eating.

## 2018-09-10 DIAGNOSIS — Z794 Long term (current) use of insulin: Secondary | ICD-10-CM | POA: Diagnosis not present

## 2018-09-10 DIAGNOSIS — E118 Type 2 diabetes mellitus with unspecified complications: Secondary | ICD-10-CM | POA: Diagnosis not present

## 2018-09-12 ENCOUNTER — Telehealth: Payer: Self-pay | Admitting: Family Medicine

## 2018-09-12 NOTE — Telephone Encounter (Signed)
I called pt and left vm to call ofc to sch Return in about 4 weeks (around 10/07/2018) for Labs, nurse BP check, influenza vaccine if we have the vaccines in stock, 61-month follow-up with PCP.

## 2018-09-19 ENCOUNTER — Telehealth: Payer: Medicare Other

## 2018-09-19 ENCOUNTER — Other Ambulatory Visit: Payer: Self-pay | Admitting: Family Medicine

## 2018-09-19 NOTE — Telephone Encounter (Signed)
Refill sent to pharmacy pharmacy.  Patient needs to be scheduled for lab work for about 2 weeks from now and a nurse BP check at that time as well.  He needs a 16-month follow-up with me scheduled as well.  It appears they tried to call him to schedule though were unable to reach him.

## 2018-10-05 ENCOUNTER — Other Ambulatory Visit: Payer: Self-pay | Admitting: Family Medicine

## 2018-10-06 ENCOUNTER — Other Ambulatory Visit: Payer: Self-pay | Admitting: Family Medicine

## 2018-10-08 ENCOUNTER — Other Ambulatory Visit: Payer: Self-pay | Admitting: Family Medicine

## 2018-10-11 DIAGNOSIS — Z794 Long term (current) use of insulin: Secondary | ICD-10-CM | POA: Diagnosis not present

## 2018-10-11 DIAGNOSIS — E118 Type 2 diabetes mellitus with unspecified complications: Secondary | ICD-10-CM | POA: Diagnosis not present

## 2018-10-13 ENCOUNTER — Telehealth: Payer: Medicare Other

## 2018-10-13 ENCOUNTER — Ambulatory Visit: Payer: Medicare Other | Admitting: Pharmacist

## 2018-10-13 DIAGNOSIS — F419 Anxiety disorder, unspecified: Secondary | ICD-10-CM

## 2018-10-13 DIAGNOSIS — F329 Major depressive disorder, single episode, unspecified: Secondary | ICD-10-CM

## 2018-10-13 DIAGNOSIS — E1142 Type 2 diabetes mellitus with diabetic polyneuropathy: Secondary | ICD-10-CM

## 2018-10-13 DIAGNOSIS — M25551 Pain in right hip: Secondary | ICD-10-CM

## 2018-10-13 DIAGNOSIS — I10 Essential (primary) hypertension: Secondary | ICD-10-CM

## 2018-10-13 NOTE — Chronic Care Management (AMB) (Signed)
Chronic Care Management   Follow Up Note   10/13/2018 Name: Alan Schmidt MRN: [id number] DOB: [birth year]  Referred by: Alan Haven, MD Reason for referral : Chronic Care Management (Medication Management)   Alan Schmidt is a 73 y.o. year old male who is a primary care patient of [name], [name], MD. The CCM team was consulted for assistance with chronic disease management and care coordination needs.    Telephonic appointment with patient today for medication management review.   Review of patient status, including review of consultants reports, relevant laboratory and other test results, and collaboration with appropriate care team members and the patient's provider was performed as part of comprehensive patient evaluation and provision of chronic care management services.    SDOH (Social Determinants of Health) screening performed today: Depression   Stress. See Care Plan for related entries.   Objective: CGM Download     Outpatient Encounter Medications as of 10/13/2018  Medication Sig Note  . acetaminophen (TYLENOL) 500 MG tablet Take 500 mg by mouth every 6 (six) hours as needed. 05/16/2018: PRN  . ALPRAZolam (XANAX) 1 MG tablet Take 0.5 tablets (0.5 mg total) by mouth 2 (two) times daily as needed for anxiety. 10/13/2018: Taking 1-2 times per day  . amLODipine (NORVASC) 5 MG tablet TAKE 1 TABLET BY MOUTH EVERY DAY   . aspirin EC 81 MG tablet Take 81 mg by mouth daily.   . BD PEN NEEDLE NANO U/F 32G X 4 MM MISC USE EVERY DAY   . carvedilol (COREG) 25 MG tablet Take 1 tablet (25 mg total) by mouth 2 (two) times daily.   . Continuous Blood Gluc Receiver (FREESTYLE LIBRE 14 DAY READER) DEVI 1 Device by Does not apply route daily. Use to scan to check blood sugar up to 7 times daily; E11.42,   . Continuous Blood Gluc Sensor (FREESTYLE LIBRE 14 DAY SENSOR) MISC 1 Device by Does not apply route every 14 (fourteen) days. E11.42   . escitalopram (LEXAPRO) 10 MG tablet  Take 1 tablet (10 mg total) by mouth daily.   Marland Kitchen ibuprofen (ADVIL) 600 MG tablet TAKE 1 TABLET (600 MG TOTAL) BY MOUTH EVERY 8 (EIGHT) HOURS AS NEEDED FOR MILD PAIN. TAKE WITH FOOD.   Marland Kitchen insulin degludec (TRESIBA FLEXTOUCH) 100 UNIT/ML SOPN FlexTouch Pen INJECT 40 UNITS INTO THE SKIN DAILY AT 10 PM. 05/16/2018: 46 units daily  . insulin lispro (HUMALOG KWIKPEN) 100 UNIT/ML KwikPen INJECT 0.12-0.18 MLS (12-18 UNITS TOTAL) INTO THE SKIN 2 (TWO) TIMES DAILY WITH A MEAL.   Marland Kitchen JARDIANCE 25 MG TABS tablet TAKE 1 TABLET (25 MG) BY MOUTH DAILY.   . metFORMIN (GLUCOPHAGE) 1000 MG tablet TAKE 1 TABLET (1,000 MG TOTAL) BY MOUTH 2 (TWO) TIMES DAILY WITH A MEAL.   Marland Kitchen nortriptyline (PAMELOR) 10 MG capsule Take 20 mg by mouth at bedtime.    . Oxycodone HCl 10 MG TABS Take 10 mg by mouth 2 (two) times daily as needed.   . tamsulosin (FLOMAX) 0.4 MG CAPS capsule TAKE 1 CAPSULE (0.4 MG TOTAL) BY MOUTH 2 (TWO) TIMES DAILY.   . cyclobenzaprine (FLEXERIL) 10 MG tablet Take 1 tablet (10 mg total) by mouth 3 (three) times daily as needed for muscle spasms. Must last 30 days. 09/08/2018: Taking 2-3x/week for leg spasms  . desoximetasone (TOPICORT) 0.25 % cream APPLY CREAM TO AFFECTED AREA TWO TIMES DAILY, FOR UP TO 7 DAYS, DO NOT APPLY TO FACE   . hydrochlorothiazide (HYDRODIURIL) 25 MG tablet  Take 1 tablet (25 mg total) by mouth daily.   . hydrOXYzine (ATARAX/VISTARIL) 50 MG tablet TAKE 1 TABLET BY MOUTH FOUR TIMES A DAY AS NEEDED (Patient not taking: Reported on 10/13/2018)   . loratadine (CLARITIN) 10 MG tablet Take 1 tablet (10 mg total) by mouth daily.   . magnesium 30 MG tablet Take 1 tablet (30 mg total) by mouth 2 (two) times daily.   . mometasone (NASONEX) 50 MCG/ACT nasal spray PLACE 2 SPRAYS INTO THE NOSE DAILY.   . naloxone (NARCAN) 2 MG/2ML injection Inject content of syringe into thigh muscle. Call 911.   . olmesartan (BENICAR) 40 MG tablet TAKE 1 TABLET BY MOUTH EVERY DAY   . pantoprazole (PROTONIX) 40 MG tablet  Take 1 tablet (40 mg total) by mouth daily.   . pravastatin (PRAVACHOL) 40 MG tablet TAKE 1 TABLET BY MOUTH EVERY DAY   . VICTOZA 18 MG/3ML SOPN INJECT 0.3 MLS (1.8 MG TOTAL) INTO THE SKIN DAILY.    No facility-administered encounter medications on file as of 10/13/2018.      Goals Addressed            This Visit's Progress     Patient Stated   . "I want to keep an eye on my diabetes" (pt-stated)       Current Barriers:  . Diabetes: uncontrolled though improved per SMBG results; most recent A1c 8.2%.  o Incredibly pleased with improved control on FreeStyle Libre CGM . Current antihyperglycemic regimen: metformin 1000 mg BID, Jardiance 25 mg daily, Victoza 1.8 mg daily, Tresiba 64 units daily, Humalog 2-10 units with meals 3x daily . Current blood glucose readings: per CGM download of the past 2 weeks: o Average glucose 133; variability 28% (goal <36%); estimated A1c 6.5% o Glucose in range 80-180s 89% of the time, above range 181-250 9% of the time  . Cardiovascular risk reduction: o Current hypertensive regimen: amlodipine 5 mg daily, carvedilol 25 mg BID, HCTZ 25 mg daily, olmesartan 40 mg daily; not checking BP at home as he does not have a working meter;  o Current hyperlipidemia regimen: pravastatin 40 mg daily; LDL well controlled last check <70  Pharmacist Clinical Goal(s):  Marland Kitchen Over the next 90 days, patient with work with PharmD and primary care provider to address optimized diabetes management  Interventions: . Congratulated patient on fantastic blood sugar control with CGM. Patient is to call clinic back to schedule fasting lab work, RN BP check, and flu shot.  . Encouraged continued checks using CGM . Encouraged patient to contact [facility] to see if he has OTC benefits that would cover a new BP cuff. Recommended arm cuff over wrist cuff. He will do this.   Patient Self Care Activities:  . Patient will check blood glucose with CGM, document, and provide at future  appointment . Patient will take medications as prescribed . Patient will report any questions or concerns to provider   Please see past updates related to this goal by clicking on the "Past Updates" button in the selected goal      . "I'm having trouble with pain" (pt-stated)       Current Barriers:  . Polypharmacy; complex patient with multiple comorbidities including chronic pain, osteoarthritis, T2DM, HTN . Reports current struggles with hip OA pain and back pain. Currently taking oxycodone 10 mg PRN (generally 1-2 times daily), ibuprofen 600 mg PRN (generally 3-4 times weekly), cyclobenzaprine PRN back spasms, nortriptyline 20 mg QPM; notes he also takes  extra ASA 81 for pain occasionally o Of note, patient also taking alprazolam often BID due to anxieties and stress  Pharmacist Clinical Goal(s):  Marland Kitchen Over the next 90 days, patient will work with PharmD and provider towards optimized medication management  Interventions: . Comprehensive medication review performed; medication list updated in electronic medical record . Recommended to avoid extra doses of ASA, and to consider this a blood thinner instead of an analgesic. He verbalized understanding . Discussed OTC lidocaine patches and OTC Voltaren gel. Recommended he try these for back pain, unlikely to provide much benefit for hip OA pain. Discussed importance of minimizing NSAIDs d/t chronic kidney disease. Discussed concerns with combination BZD and opioid medications, in combo with other CNS sedating medications such as cyclobenzaprine.  . Could consider duloxetine in the future, in place of escitalopram, to target anxiety/depression and chronic pain.  Patient Self Care Activities:  . Patient will take medications as prescribed  Initial goal documentation        Plan:  - Will outreach patient in ~4-5 weeks for continued medication management support  [name], PharmD, [place] Pharmacist [place] [phone]

## 2018-10-13 NOTE — Patient Instructions (Signed)
Visit Information  Goals Addressed            This Visit's Progress     Patient Stated   . "I want to keep an eye on my diabetes" (pt-stated)       Current Barriers:  . Diabetes: uncontrolled though improved per SMBG results; most recent A1c 8.2%.  o Incredibly pleased with improved control on FreeStyle Libre CGM . Current antihyperglycemic regimen: metformin 1000 mg BID, Jardiance 25 mg daily, Victoza 1.8 mg daily, Tresiba 64 units daily, Humalog 2-10 units with meals 3x daily . Current blood glucose readings: per CGM download of the past 2 weeks: o Average glucose 133; variability 28% (goal <36%); estimated A1c 6.5% o Glucose in range 80-180s 89% of the time, above range 181-250 9% of the time  . Cardiovascular risk reduction: o Current hypertensive regimen: amlodipine 5 mg daily, carvedilol 25 mg BID, HCTZ 25 mg daily, olmesartan 40 mg daily; not checking BP at home as he does not have a working meter;  o Current hyperlipidemia regimen: pravastatin 40 mg daily; LDL well controlled last check <70  Pharmacist Clinical Goal(s):  Marland Kitchen Over the next 90 days, patient with work with PharmD and primary care provider to address optimized diabetes management  Interventions: . Congratulated patient on fantastic blood sugar control with CGM. Patient is to call clinic back to schedule fasting lab work, RN BP check, and flu shot.  . Encouraged continued checks using CGM . Encouraged patient to contact [facility] to see if he has OTC benefits that would cover a new BP cuff. Recommended arm cuff over wrist cuff. He will do this.   Patient Self Care Activities:  . Patient will check blood glucose with CGM, document, and provide at future appointment . Patient will take medications as prescribed . Patient will report any questions or concerns to provider   Please see past updates related to this goal by clicking on the "Past Updates" button in the selected goal      . "I'm having trouble  with pain" (pt-stated)       Current Barriers:  . Polypharmacy; complex patient with multiple comorbidities including chronic pain, osteoarthritis, T2DM, HTN . Reports current struggles with hip OA pain and back pain. Currently taking oxycodone 10 mg PRN (generally 1-2 times daily), ibuprofen 600 mg PRN (generally 3-4 times weekly), cyclobenzaprine PRN back spasms, nortriptyline 20 mg QPM; notes he also takes extra ASA 81 for pain occasionally o Of note, patient also taking alprazolam often BID due to anxieties and stress  Pharmacist Clinical Goal(s):  Marland Kitchen Over the next 90 days, patient will work with PharmD and provider towards optimized medication management  Interventions: . Comprehensive medication review performed; medication list updated in electronic medical record . Recommended to avoid extra doses of ASA, and to consider this a blood thinner instead of an analgesic. He verbalized understanding . Discussed OTC lidocaine patches and OTC Voltaren gel. Recommended he try these for back pain, unlikely to provide much benefit for hip OA pain. Discussed importance of minimizing NSAIDs d/t chronic kidney disease. Discussed concerns with combination BZD and opioid medications, in combo with other CNS sedating medications such as cyclobenzaprine.  . Could consider duloxetine in the future, in place of escitalopram, to target anxiety/depression and chronic pain.  Patient Self Care Activities:  . Patient will take medications as prescribed  Initial goal documentation        The patient verbalized understanding of instructions provided today and declined  a print copy of patient instruction materials.   Plan:  - Will outreach patient in ~4-5 weeks for continued medication management support  [name], PharmD, [place] Pharmacist [place] [phone]

## 2018-10-19 ENCOUNTER — Telehealth: Payer: Self-pay

## 2018-10-19 ENCOUNTER — Ambulatory Visit: Payer: Medicare Other

## 2018-10-19 NOTE — Telephone Encounter (Signed)
Called patient at agreed upon time for medicare annual wellness and health maintenance update appointment. No answer. Left a voice mail requesting a call back or rescheduling as appropriate.

## 2018-10-20 ENCOUNTER — Ambulatory Visit: Payer: Self-pay | Admitting: Pharmacist

## 2018-10-20 ENCOUNTER — Other Ambulatory Visit: Payer: Self-pay | Admitting: Family Medicine

## 2018-10-20 NOTE — Chronic Care Management (AMB) (Signed)
  Chronic Care Management   Note  10/20/2018 Name: Alan Schmidt MRN: [id number] DOB: 11-22-1945  Alan Schmidt is a 73 y.o. year old male who is a primary care patient of [name], [name], MD. The CCM team was consulted for assistance with chronic disease management and care coordination needs.    Received voicemail from patient asking if the supplement turmeric would interact with his medications. This would be fine, as any concern would be with more potent anticoagulant/antiplatelet agents or more frequent NSAID use in regard to bleed risk.  Returned his phone call, left message stating that he could try this supplement if he wanted to for inflammation.   Follow up plan: - Patient has my contact information for any further questions. Will outreach as previously scheduled for continued medication management review  [name], PharmD, [place] Pharmacist [facility] [place] [phone]

## 2018-10-21 ENCOUNTER — Ambulatory Visit: Payer: Medicare Other

## 2018-10-24 NOTE — Telephone Encounter (Signed)
Xanax refilled.  Will await his lab results from later this week prior to sending him the ibuprofen.  Please contact the patient let him know.

## 2018-10-24 NOTE — Telephone Encounter (Signed)
Pt following up on refill request. Advised pt it can take up to 3 business days for refill.  Pt verbalized understanding.

## 2018-10-24 NOTE — Telephone Encounter (Signed)
Last refill on Zannax 7/20 and last rill Ibuprofen last fill 10/07/18.

## 2018-10-25 NOTE — Telephone Encounter (Signed)
Called and left a message that the xanax was sent to the pharmacy but the ibuprofen was not and it will be sent after lab results.  Pt has lab appointment tomorrow.  Aylissa Heinemann,cma

## 2018-10-26 ENCOUNTER — Other Ambulatory Visit (INDEPENDENT_AMBULATORY_CARE_PROVIDER_SITE_OTHER): Payer: Medicare Other

## 2018-10-26 ENCOUNTER — Ambulatory Visit (INDEPENDENT_AMBULATORY_CARE_PROVIDER_SITE_OTHER): Payer: Medicare Other

## 2018-10-26 ENCOUNTER — Other Ambulatory Visit: Payer: Self-pay

## 2018-10-26 DIAGNOSIS — Z794 Long term (current) use of insulin: Secondary | ICD-10-CM

## 2018-10-26 DIAGNOSIS — I10 Essential (primary) hypertension: Secondary | ICD-10-CM

## 2018-10-26 DIAGNOSIS — Z23 Encounter for immunization: Secondary | ICD-10-CM

## 2018-10-26 DIAGNOSIS — E1142 Type 2 diabetes mellitus with diabetic polyneuropathy: Secondary | ICD-10-CM

## 2018-10-26 LAB — LIPID PANEL
Cholesterol: 124 mg/dL (ref 0–200)
HDL: 41.5 mg/dL (ref >39.00)
LDL Cholesterol: 57 mg/dL (ref 0–99)
NonHDL: 82.38
Total CHOL/HDL Ratio: 3
Triglycerides: 126 mg/dL (ref 0.0–149.0)
VLDL: 25.2 mg/dL (ref 0.0–40.0)

## 2018-10-26 LAB — COMPREHENSIVE METABOLIC PANEL
ALT: 25 U/L (ref 0–53)
AST: 23 U/L (ref 0–37)
Albumin: 4 g/dL (ref 3.5–5.2)
Alkaline Phosphatase: 109 U/L (ref 39–117)
BUN: 12 mg/dL (ref 6–23)
CO2: 32 mEq/L (ref 19–32)
Calcium: 9.6 mg/dL (ref 8.4–10.5)
Chloride: 95 mEq/L — ABNORMAL LOW (ref 96–112)
Creatinine, Ser: 1.04 mg/dL (ref 0.40–1.50)
GFR: 84.78 mL/min (ref >60.00)
Glucose, Bld: 139 mg/dL — ABNORMAL HIGH (ref 70–99)
Potassium: 4.4 mEq/L (ref 3.5–5.1)
Sodium: 136 mEq/L (ref 135–145)
Total Bilirubin: 0.3 mg/dL (ref 0.2–1.2)
Total Protein: 7.2 g/dL (ref 6.0–8.3)

## 2018-10-26 LAB — HEMOGLOBIN A1C: Hgb A1c MFr Bld: 8.5 % — ABNORMAL HIGH (ref 4.6–6.5)

## 2018-10-26 NOTE — Telephone Encounter (Signed)
Patient calling about Ibuprofen. He requests it be sent to pharmacy today, if possible.

## 2018-10-27 MED ORDER — IBUPROFEN 600 MG PO TABS: 600.0000 mg | ORAL_TABLET | Freq: Three times a day (TID) | ORAL | 0 refills | Status: DC | PRN

## 2018-10-27 NOTE — Telephone Encounter (Signed)
I called and gave patient his lab results and informed him of the discrepancy with the A1C, I also informed him that this xanax and ibuprofen was sent to pharmacy. Patient understood.  Yezenia Fredrick,cma

## 2018-10-27 NOTE — Telephone Encounter (Signed)
Renal function acceptable.  Ibuprofen sent to pharmacy.

## 2018-10-27 NOTE — Telephone Encounter (Signed)
The patient is requesting a refill on Ibuprofen, can you check his labs and see if he can have this refill.  Sladen Plancarte,cma

## 2018-10-31 ENCOUNTER — Ambulatory Visit: Payer: Self-pay | Admitting: Pharmacist

## 2018-10-31 DIAGNOSIS — E1142 Type 2 diabetes mellitus with diabetic polyneuropathy: Secondary | ICD-10-CM

## 2018-10-31 NOTE — Patient Instructions (Signed)
Visit Information  Goals Addressed            This Visit's Progress     Patient Stated   . "I want to keep an eye on my diabetes" (pt-stated)       Current Barriers:  . Diabetes: uncontrolled though improved per CGM results; most recent A1c 8.5%.  o Discrepancy between CGM glucose results and A1c lab result  . Current antihyperglycemic regimen: metformin 1000 mg BID, Jardiance 25 mg daily, Victoza 1.8 mg daily, Tresiba 64 units daily, Humalog 2-10 units with meals 3x daily . Current blood glucose readings: per CGM download of the past 90 days  o Target range 70-180: 83% of the time; high 181-250: 16% of the time; very high >250 1% of the time o Glucose Management Indicator: 6.8% o Average glucose: 145  . Cardiovascular risk reduction: o Current hypertensive regimen: amlodipine 5 mg daily, carvedilol 25 mg BID, HCTZ 25 mg daily, olmesartan 40 mg daily; not checking BP at home as he does not have a working meter;  o Current hyperlipidemia regimen: pravastatin 40 mg daily; LDL well controlled last check <70  Pharmacist Clinical Goal(s):  Marland Kitchen Over the next 90 days, patient with work with PharmD and primary care provider to address optimized diabetes management  Interventions: . A1c higher than anticipated based on CGM readings.  o Reasons for falsely elevated A1c include anemia, kidney failure; any reason that would prolong red blood cell half life o Per FreeStyle Libre packaging: Taking salicylic acid (used in some pain relievers such as aspirin and some skin care products) may slightly lower Sensor glucose readings. The level of inaccuracy depends on the amount of the interfering substance active in the body. o Will discuss proper sensor placement with patient, and ensure he is placing appropriately in subcutaneous tissue. May plan to have patient come to office for visual inspection. If issues with placement are not found, will plan to check fructosamine and CBC  Patient Self Care  Activities:  . Patient will check blood glucose with CGM, document, and provide at future appointment . Patient will take medications as prescribed . Patient will report any questions or concerns to provider   Please see past updates related to this goal by clicking on the "Past Updates" button in the selected goal         The patient verbalized understanding of instructions provided today and declined a print copy of patient instruction materials.   Plan:  - Will await phone call from patient to review appropriate sensor placement - If I do not hear back, will outreach patient again in the next 3-4 weeks  [name], PharmD, [place] Pharmacist [place] [phone]

## 2018-10-31 NOTE — Chronic Care Management (AMB) (Signed)
Chronic Care Management   Follow Up Note   10/31/2018 Name: Alan Schmidt MRN: [id number] DOB: [birth year]  Referred by: [name], MD Reason for referral : Chronic Care Management (Medication Management)   Alan Schmidt is a 73 y.o. year old male who is a primary care patient of [name], [name], MD. The CCM team was consulted for assistance with chronic disease management and care coordination needs.    Attempted to contact patient to discuss CGM vs A1c test result discrepancy. Estate manager/land agent and discussed patient case with other CGM clinicians.   Review of patient status, including review of consultants reports, relevant laboratory and other test results, and collaboration with appropriate care team members and the patient's provider was performed as part of comprehensive patient evaluation and provision of chronic care management services.    SDOH (Social Determinants of Health) screening performed today: None. See Care Plan for related entries.   Advanced Directives Status: N See Care Plan and Vynca application for related entries.  Outpatient Encounter Medications as of 10/31/2018  Medication Sig Note  . acetaminophen (TYLENOL) 500 MG tablet Take 500 mg by mouth every 6 (six) hours as needed. 05/16/2018: PRN  . ALPRAZolam (XANAX) 1 MG tablet TAKE 0.5 TABLETS (0.5 MG TOTAL) BY MOUTH 2 (TWO) TIMES DAILY AS NEEDED FOR ANXIETY.   Marland Kitchen amLODipine (NORVASC) 5 MG tablet TAKE 1 TABLET BY MOUTH EVERY DAY   . aspirin EC 81 MG tablet Take 81 mg by mouth daily.   . BD PEN NEEDLE NANO U/F 32G X 4 MM MISC USE EVERY DAY   . carvedilol (COREG) 25 MG tablet Take 1 tablet (25 mg total) by mouth 2 (two) times daily.   . Continuous Blood Gluc Receiver (FREESTYLE LIBRE 14 DAY READER) DEVI 1 Device by Does not apply route daily. Use to scan to check blood sugar up to 7 times daily; E11.42,   . Continuous Blood Gluc Sensor (FREESTYLE LIBRE 14 DAY SENSOR) MISC 1 Device by Does not apply  route every 14 (fourteen) days. E11.42   . cyclobenzaprine (FLEXERIL) 10 MG tablet Take 1 tablet (10 mg total) by mouth 3 (three) times daily as needed for muscle spasms. Must last 30 days. 09/08/2018: Taking 2-3x/week for leg spasms  . desoximetasone (TOPICORT) 0.25 % cream APPLY CREAM TO AFFECTED AREA TWO TIMES DAILY, FOR UP TO 7 DAYS, DO NOT APPLY TO FACE   . escitalopram (LEXAPRO) 10 MG tablet Take 1 tablet (10 mg total) by mouth daily.   . hydrochlorothiazide (HYDRODIURIL) 25 MG tablet Take 1 tablet (25 mg total) by mouth daily.   . hydrOXYzine (ATARAX/VISTARIL) 50 MG tablet TAKE 1 TABLET BY MOUTH FOUR TIMES A DAY AS NEEDED (Patient not taking: Reported on 10/13/2018)   . ibuprofen (ADVIL) 600 MG tablet Take 1 tablet (600 mg total) by mouth every 8 (eight) hours as needed for mild pain. Take with food.   . insulin degludec (TRESIBA FLEXTOUCH) 100 UNIT/ML SOPN FlexTouch Pen INJECT 40 UNITS INTO THE SKIN DAILY AT 10 PM. 05/16/2018: 46 units daily  . insulin lispro (HUMALOG KWIKPEN) 100 UNIT/ML KwikPen INJECT 0.12-0.18 MLS (12-18 UNITS TOTAL) INTO THE SKIN 2 (TWO) TIMES DAILY WITH A MEAL.   Marland Kitchen JARDIANCE 25 MG TABS tablet TAKE 1 TABLET (25 MG) BY MOUTH DAILY.   Marland Kitchen loratadine (CLARITIN) 10 MG tablet Take 1 tablet (10 mg total) by mouth daily.   . magnesium 30 MG tablet Take 1 tablet (30 mg total) by mouth 2 (  two) times daily.   . metFORMIN (GLUCOPHAGE) 1000 MG tablet TAKE 1 TABLET (1,000 MG TOTAL) BY MOUTH 2 (TWO) TIMES DAILY WITH A MEAL.   . mometasone (NASONEX) 50 MCG/ACT nasal spray PLACE 2 SPRAYS INTO THE NOSE DAILY.   . naloxone (NARCAN) 2 MG/2ML injection Inject content of syringe into thigh muscle. Call 911.   . nortriptyline (PAMELOR) 10 MG capsule Take 20 mg by mouth at bedtime.    Marland Kitchen olmesartan (BENICAR) 40 MG tablet TAKE 1 TABLET BY MOUTH EVERY DAY   . Oxycodone HCl 10 MG TABS Take 10 mg by mouth 2 (two) times daily as needed.   . pantoprazole (PROTONIX) 40 MG tablet Take 1 tablet (40 mg total)  by mouth daily.   . pravastatin (PRAVACHOL) 40 MG tablet TAKE 1 TABLET BY MOUTH EVERY DAY   . tamsulosin (FLOMAX) 0.4 MG CAPS capsule TAKE 1 CAPSULE (0.4 MG TOTAL) BY MOUTH 2 (TWO) TIMES DAILY.   Marland Kitchen VICTOZA 18 MG/3ML SOPN INJECT 0.3 MLS (1.8 MG TOTAL) INTO THE SKIN DAILY.    No facility-administered encounter medications on file as of 10/31/2018.      Goals Addressed            This Visit's Progress     Patient Stated   . "I want to keep an eye on my diabetes" (pt-stated)       Current Barriers:  . Diabetes: uncontrolled though improved per CGM results; most recent A1c 8.5%.  o Discrepancy between CGM glucose results and A1c lab result  . Current antihyperglycemic regimen: metformin 1000 mg BID, Jardiance 25 mg daily, Victoza 1.8 mg daily, Tresiba 64 units daily, Humalog 2-10 units with meals 3x daily . Current blood glucose readings: per CGM download of the past 90 days  o Target range 70-180: 83% of the time; high 181-250: 16% of the time; very high >250 1% of the time o Glucose Management Indicator: 6.8% o Average glucose: 145  . Cardiovascular risk reduction: o Current hypertensive regimen: amlodipine 5 mg daily, carvedilol 25 mg BID, HCTZ 25 mg daily, olmesartan 40 mg daily; not checking BP at home as he does not have a working meter;  o Current hyperlipidemia regimen: pravastatin 40 mg daily; LDL well controlled last check <70  Pharmacist Clinical Goal(s):  Marland Kitchen Over the next 90 days, patient with work with PharmD and primary care provider to address optimized diabetes management  Interventions: . A1c higher than anticipated based on CGM readings.  o Reasons for falsely elevated A1c include anemia, kidney failure; any reason that would prolong red blood cell half life o Per FreeStyle Libre packaging: Taking salicylic acid (used in some pain relievers such as aspirin and some skin care products) may slightly lower Sensor glucose readings. The level of inaccuracy depends on the  amount of the interfering substance active in the body. o Will discuss proper sensor placement with patient, and ensure he is placing appropriately in subcutaneous tissue. May plan to have patient come to office for visual inspection. If issues with placement are not found, will plan to check fructosamine and CBC  Patient Self Care Activities:  . Patient will check blood glucose with CGM, document, and provide at future appointment . Patient will take medications as prescribed . Patient will report any questions or concerns to provider   Please see past updates related to this goal by clicking on the "Past Updates" button in the selected goal          Plan:  -  Will await phone call from patient to review appropriate sensor placement - If I do not hear back, will outreach patient again in the next 3-4 weeks  [name], PharmD, [place] Pharmacist [place] [phone]

## 2018-11-03 ENCOUNTER — Ambulatory Visit: Payer: Self-pay | Admitting: Pharmacist

## 2018-11-03 NOTE — Chronic Care Management (AMB) (Signed)
  Chronic Care Management   Note  11/03/2018 Name: Alan Schmidt MRN: [id number] DOB: Jun 25, 1945  Alan Schmidt is a 73 y.o. year old male who is a primary care patient of [name], [name], MD. The CCM team was consulted for assistance with chronic disease management and care coordination needs.    Received return call from patient, additionally asking about appropriate locations for Voltaren use. Called patient back; left HIPAA compliant message for him to return my call at his convenience.   Follow up plan: - If I do not hear back, will attempt outreach again on Monday  [name], PharmD, [place] Pharmacist [place] [phone]

## 2018-11-06 ENCOUNTER — Other Ambulatory Visit: Payer: Self-pay | Admitting: Family Medicine

## 2018-11-07 ENCOUNTER — Ambulatory Visit: Payer: Self-pay | Admitting: Pharmacist

## 2018-11-07 DIAGNOSIS — E1142 Type 2 diabetes mellitus with diabetic polyneuropathy: Secondary | ICD-10-CM

## 2018-11-07 NOTE — Chronic Care Management (AMB) (Signed)
  Chronic Care Management   Note  11/07/2018 Name: Alan Schmidt MRN: [id number] DOB: [birth year]  [name] is a 73 y.o. year old male who is a primary care patient of [name], [name], MD. The CCM team was consulted for assistance with chronic disease management and care coordination needs.    Received return call after hours from patient. Attempted to contact patient again to discuss CGM download/A1c mismatch; left HIPAA compliant message for patient to return my call at his convenience.   Follow up plan: - If I do not hear back, will attempt outreach again in the next 1-2 weeks  [name], PharmD, [place] Pharmacist [place] [phone]

## 2018-11-07 NOTE — Patient Instructions (Addendum)
[name],   Please plan to apply a new sensor with me next Thursday, October 15th at 4 pm. We will also plan to check some lab work, all to try to determine why there is a discrepancy between your A1c and your FreeStyle Libre readings.   Please give me a call if something comes up and you need to reschedule.   Thanks! [name], PharmD, [place]  Visit Information  Goals Addressed            This Visit's Progress     Patient Stated   . "I want to keep an eye on my diabetes" (pt-stated)       Current Barriers:  . Diabetes: uncontrolled though improved per CGM results; most recent A1c 8.5%.  o Discrepancy between CGM glucose results and A1c lab result  o Patient notes he replaced his sensor last Thursday ~ 12 am.  . Current antihyperglycemic regimen: metformin 1000 mg BID, Jardiance 25 mg daily, Victoza 1.8 mg daily, Tresiba 64 units daily, Humalog 2-10 units with meals 3x daily . Current blood glucose readings: per CGM download of the past 90 days  o Target range 70-180: 83% of the time; high 181-250: 16% of the time; very high >250 1% of the time o Glucose Management Indicator: 6.8% o Average glucose: 145  . Cardiovascular risk reduction: o Current hypertensive regimen: amlodipine 5 mg daily, carvedilol 25 mg BID, HCTZ 25 mg daily, olmesartan 40 mg daily; not checking BP at home as he does not have a working meter;  o Current hyperlipidemia regimen: pravastatin 40 mg daily; LDL well controlled last check <70  Pharmacist Clinical Goal(s):  Marland Kitchen Over the next 90 days, patient with work with PharmD and primary care provider to address optimized diabetes management  Interventions: . A1c higher than anticipated based on CGM readings. As patient will be due to replace a sensor next Thursday, scheduled an in office appointment to review application technique. Will also plan to collect fructosamine level to another marker of kidney damage, and CBC to evaluate for anemia.   o Reasons for falsely elevated A1c include anemia, kidney failure; any reason that would prolong red blood cell half life o Per FreeStyle Libre packaging: Taking salicylic acid (used in some pain relievers such as aspirin and some skin care products) may slightly lower sensor glucose readings. The level of inaccuracy depends on the amount of the interfering substance active in the body.  Patient Self Care Activities:  . Patient will check blood glucose with CGM, document, and provide at future appointment . Patient will take medications as prescribed . Patient will report any questions or concerns to provider   Please see past updates related to this goal by clicking on the "Past Updates" button in the selected goal      . "I'm having trouble with pain" (pt-stated)       Current Barriers:  . Polypharmacy; complex patient with multiple comorbidities including chronic pain, osteoarthritis, T2DM, HTN . Reports current struggles with hip OA pain and back pain. Currently taking oxycodone 10 mg PRN (generally 1-2 times daily), ibuprofen 600 mg PRN (generally 3-4 times weekly), cyclobenzaprine PRN back spasms, nortriptyline 20 mg QPM; notes he also takes extra ASA 81 for pain occasionally . Notes that he picked up OTC Voltaren, and will plan to apply for affected joints. Notes worst pain right now is in knee, hip, shoulder, and back. He notes that the Voltaren packaging recommends to avoid hip, shoulder, and back  Pharmacist Clinical Goal(s):  Marland Kitchen Over the next 90 days, patient will work with PharmD and provider towards optimized medication management  Interventions: . Discussed that topical NSAID unlikely to provide benefit in deeper joints, such as hip, and was studied in knee/hand OA. OTC packaging would only reflect areas that had been studied and shown benefit. Patient verbalized understanding. He notes he will try on his knee/hand inflammation.  Patient Self Care Activities:  . Patient will  take medications as prescribed  Please see past updates related to this goal by clicking on the "Past Updates" button in the selected goal         The patient verbalized understanding of instructions provided today and declined a print copy of patient instruction materials.  Plan:  - Will meet with patient face to face next week as scheduled.   [name], PharmD, [place] Pharmacist [facility] [place] [phone]

## 2018-11-07 NOTE — Chronic Care Management (AMB) (Signed)
Chronic Care Management   Follow Up Note   11/07/2018 Name: MOON REICHERT MRN: [id number] DOB: [birth year]  Referred by: [name], MD Reason for referral : Chronic Care Management (Medication Management)   DESTINY SCHMUCK is a 73 y.o. year old male who is a primary care patient of [name], [name], MD. The CCM team was consulted for assistance with chronic disease management and care coordination needs.    Received return call from patient.  Review of patient status, including review of consultants reports, relevant laboratory and other test results, and collaboration with appropriate care team members and the patient's provider was performed as part of comprehensive patient evaluation and provision of chronic care management services.    SDOH (Social Determinants of Health) screening performed today: Physical Activity. See Care Plan for related entries.   Advanced Directives Status: N See Care Plan and Vynca application for related entries.  Outpatient Encounter Medications as of 11/07/2018  Medication Sig Note  . acetaminophen (TYLENOL) 500 MG tablet Take 500 mg by mouth every 6 (six) hours as needed. 05/16/2018: PRN  . ALPRAZolam (XANAX) 1 MG tablet TAKE 0.5 TABLETS (0.5 MG TOTAL) BY MOUTH 2 (TWO) TIMES DAILY AS NEEDED FOR ANXIETY.   Marland Kitchen amLODipine (NORVASC) 5 MG tablet TAKE 1 TABLET BY MOUTH EVERY DAY   . aspirin EC 81 MG tablet Take 81 mg by mouth daily.   . BD PEN NEEDLE NANO U/F 32G X 4 MM MISC USE EVERY DAY   . carvedilol (COREG) 25 MG tablet TAKE 1 TABLET BY MOUTH TWICE A DAY   . Continuous Blood Gluc Receiver (FREESTYLE LIBRE 14 DAY READER) DEVI 1 Device by Does not apply route daily. Use to scan to check blood sugar up to 7 times daily; E11.42,   . Continuous Blood Gluc Sensor (FREESTYLE LIBRE 14 DAY SENSOR) MISC 1 Device by Does not apply route every 14 (fourteen) days. E11.42   . cyclobenzaprine (FLEXERIL) 10 MG tablet Take 1 tablet (10 mg total) by mouth 3  (three) times daily as needed for muscle spasms. Must last 30 days. 09/08/2018: Taking 2-3x/week for leg spasms  . desoximetasone (TOPICORT) 0.25 % cream APPLY CREAM TO AFFECTED AREA TWO TIMES DAILY, FOR UP TO 7 DAYS, DO NOT APPLY TO FACE   . escitalopram (LEXAPRO) 10 MG tablet Take 1 tablet (10 mg total) by mouth daily.   . hydrochlorothiazide (HYDRODIURIL) 25 MG tablet Take 1 tablet (25 mg total) by mouth daily.   . hydrOXYzine (ATARAX/VISTARIL) 50 MG tablet TAKE 1 TABLET BY MOUTH FOUR TIMES A DAY AS NEEDED (Patient not taking: Reported on 10/13/2018)   . ibuprofen (ADVIL) 600 MG tablet Take 1 tablet (600 mg total) by mouth every 8 (eight) hours as needed for mild pain. Take with food.   . insulin degludec (TRESIBA FLEXTOUCH) 100 UNIT/ML SOPN FlexTouch Pen INJECT 40 UNITS INTO THE SKIN DAILY AT 10 PM. 05/16/2018: 46 units daily  . insulin lispro (HUMALOG KWIKPEN) 100 UNIT/ML KwikPen INJECT 0.12-0.18 MLS (12-18 UNITS TOTAL) INTO THE SKIN 2 (TWO) TIMES DAILY WITH A MEAL.   Marland Kitchen JARDIANCE 25 MG TABS tablet TAKE 1 TABLET (25 MG) BY MOUTH DAILY.   Marland Kitchen loratadine (CLARITIN) 10 MG tablet Take 1 tablet (10 mg total) by mouth daily.   . magnesium 30 MG tablet Take 1 tablet (30 mg total) by mouth 2 (two) times daily.   . metFORMIN (GLUCOPHAGE) 1000 MG tablet TAKE 1 TABLET (1,000 MG TOTAL) BY MOUTH 2 (TWO)  TIMES DAILY WITH A MEAL.   . mometasone (NASONEX) 50 MCG/ACT nasal spray PLACE 2 SPRAYS INTO THE NOSE DAILY.   . naloxone (NARCAN) 2 MG/2ML injection Inject content of syringe into thigh muscle. Call 911.   . nortriptyline (PAMELOR) 10 MG capsule Take 20 mg by mouth at bedtime.    Marland Kitchen olmesartan (BENICAR) 40 MG tablet TAKE 1 TABLET BY MOUTH EVERY DAY   . Oxycodone HCl 10 MG TABS Take 10 mg by mouth 2 (two) times daily as needed.   . pantoprazole (PROTONIX) 40 MG tablet TAKE 1 TABLET BY MOUTH EVERY DAY   . pravastatin (PRAVACHOL) 40 MG tablet TAKE 1 TABLET BY MOUTH DAILY   . tamsulosin (FLOMAX) 0.4 MG CAPS capsule  TAKE 1 CAPSULE (0.4 MG TOTAL) BY MOUTH 2 (TWO) TIMES DAILY.   Marland Kitchen VICTOZA 18 MG/3ML SOPN INJECT 0.3 MLS (1.8 MG TOTAL) INTO THE SKIN DAILY.    No facility-administered encounter medications on file as of 11/07/2018.      Goals Addressed            This Visit's Progress     Patient Stated   . "I want to keep an eye on my diabetes" (pt-stated)       Current Barriers:  . Diabetes: uncontrolled though improved per CGM results; most recent A1c 8.5%.  o Discrepancy between CGM glucose results and A1c lab result  o Patient notes he replaced his sensor last Thursday ~ 12 am.  . Current antihyperglycemic regimen: metformin 1000 mg BID, Jardiance 25 mg daily, Victoza 1.8 mg daily, Tresiba 64 units daily, Humalog 2-10 units with meals 3x daily . Current blood glucose readings: per CGM download of the past 90 days  o Target range 70-180: 83% of the time; high 181-250: 16% of the time; very high >250 1% of the time o Glucose Management Indicator: 6.8% o Average glucose: 145  . Cardiovascular risk reduction: o Current hypertensive regimen: amlodipine 5 mg daily, carvedilol 25 mg BID, HCTZ 25 mg daily, olmesartan 40 mg daily; not checking BP at home as he does not have a working meter;  o Current hyperlipidemia regimen: pravastatin 40 mg daily; LDL well controlled last check <70  Pharmacist Clinical Goal(s):  Marland Kitchen Over the next 90 days, patient with work with PharmD and primary care provider to address optimized diabetes management  Interventions: . A1c higher than anticipated based on CGM readings. As patient will be due to replace a sensor next Thursday, scheduled an in office appointment to review application technique. Will also plan to collect fructosamine level to another marker of kidney damage, and CBC to evaluate for anemia.  o Reasons for falsely elevated A1c include anemia, kidney failure; any reason that would prolong red blood cell half life o Per FreeStyle Libre packaging: Taking salicylic  acid (used in some pain relievers such as aspirin and some skin care products) may slightly lower sensor glucose readings. The level of inaccuracy depends on the amount of the interfering substance active in the body.  Patient Self Care Activities:  . Patient will check blood glucose with CGM, document, and provide at future appointment . Patient will take medications as prescribed . Patient will report any questions or concerns to provider   Please see past updates related to this goal by clicking on the "Past Updates" button in the selected goal      . "I'm having trouble with pain" (pt-stated)       Current Barriers:  . Polypharmacy; complex  patient with multiple comorbidities including chronic pain, osteoarthritis, T2DM, HTN . Reports current struggles with hip OA pain and back pain. Currently taking oxycodone 10 mg PRN (generally 1-2 times daily), ibuprofen 600 mg PRN (generally 3-4 times weekly), cyclobenzaprine PRN back spasms, nortriptyline 20 mg QPM; notes he also takes extra ASA 81 for pain occasionally . Notes that he picked up OTC Voltaren, and will plan to apply for affected joints. Notes worst pain right now is in knee, hip, shoulder, and back. He notes that the Voltaren packaging recommends to avoid hip, shoulder, and back  Pharmacist Clinical Goal(s):  Marland Kitchen Over the next 90 days, patient will work with PharmD and provider towards optimized medication management  Interventions: . Discussed that topical NSAID unlikely to provide benefit in deeper joints, such as hip, and was studied in knee/hand OA. OTC packaging would only reflect areas that had been studied and shown benefit. Patient verbalized understanding. He notes he will try on his knee/hand inflammation.  Patient Self Care Activities:  . Patient will take medications as prescribed  Please see past updates related to this goal by clicking on the "Past Updates" button in the selected goal         Plan:  - Will meet  with patient face to face next week as scheduled.   [name], PharmD, [place] Pharmacist [facility] [place] [phone]

## 2018-11-08 DIAGNOSIS — E113591 Type 2 diabetes mellitus with proliferative diabetic retinopathy without macular edema, right eye: Secondary | ICD-10-CM | POA: Diagnosis not present

## 2018-11-08 DIAGNOSIS — H353131 Nonexudative age-related macular degeneration, bilateral, early dry stage: Secondary | ICD-10-CM | POA: Diagnosis not present

## 2018-11-08 DIAGNOSIS — E113292 Type 2 diabetes mellitus with mild nonproliferative diabetic retinopathy without macular edema, left eye: Secondary | ICD-10-CM | POA: Diagnosis not present

## 2018-11-10 ENCOUNTER — Telehealth: Payer: Medicare Other

## 2018-11-10 DIAGNOSIS — Z794 Long term (current) use of insulin: Secondary | ICD-10-CM | POA: Diagnosis not present

## 2018-11-10 DIAGNOSIS — E118 Type 2 diabetes mellitus with unspecified complications: Secondary | ICD-10-CM | POA: Diagnosis not present

## 2018-11-10 LAB — HM DIABETES EYE EXAM

## 2018-11-17 ENCOUNTER — Other Ambulatory Visit: Payer: Self-pay | Admitting: Family Medicine

## 2018-11-17 ENCOUNTER — Ambulatory Visit (INDEPENDENT_AMBULATORY_CARE_PROVIDER_SITE_OTHER): Payer: Medicare Other | Admitting: Pharmacist

## 2018-11-17 DIAGNOSIS — Z794 Long term (current) use of insulin: Secondary | ICD-10-CM

## 2018-11-17 DIAGNOSIS — E1142 Type 2 diabetes mellitus with diabetic polyneuropathy: Secondary | ICD-10-CM

## 2018-11-17 NOTE — Patient Instructions (Addendum)
It was great to meet you in person today!  Make sure you are placing the sensor in "fatty" tissue. We will let you know the results of the lab work, and next steps.   PLEASE call pain management to schedule an appointment.   Visit Information  Goals Addressed            This Visit's Progress     Patient Stated   . "I want to keep an eye on my diabetes" (pt-stated)       Current Barriers:  . Diabetes: uncontrolled though improved per CGM results; most recent A1c 8.5%.  o Discrepancy between CGM glucose results and A1c lab result  . Current antihyperglycemic regimen: metformin 1000 mg BID, Jardiance 25 mg daily, Victoza 1.8 mg daily, Tresiba 64 units daily, Humalog 10 with meals 3x daily . Current blood glucose readings: per CGM download of the past 90 days  o Target range 70-180: 83% of the time; high 181-250: 16% of the time; very high >250 1% of the time o Glucose Management Indicator: 6.8% o Average glucose: 144 . Cardiovascular risk reduction: o Current hypertensive regimen: amlodipine 5 mg daily, carvedilol 25 mg BID, HCTZ 25 mg daily, olmesartan 40 mg daily; not checking BP at home as he does not have a working meter;  o Current hyperlipidemia regimen: pravastatin 40 mg daily; LDL well controlled last check <70  Pharmacist Clinical Goal(s):  Marland Kitchen Over the next 90 days, patient with work with PharmD and primary care provider to address optimized diabetes management  Interventions: . Ordered CBC to evaluate for anemia-related causes of A1c/CGM mismatch. Ordered fructosamine to evaluate glucose. Will discuss results with Dr. [name] to determine next steps . Reviewed CGM application technique. Patient has been placing over his bicep on front of arm, instead of SQ tissue on back of arm, to prevent sensor from popping off when he rolls over in bed. Recommended he place on SQ tissue on inside of arm, in case proximity to muscle tissue is impact CGM accuracy.  . Reviewed for  interfering substances that would impact CGM accuracy. Patient denies use of ASA outside of daily 81 mg tab. Denies frequent APAP use.   Patient Self Care Activities:  . Patient will check blood glucose with CGM, document, and provide at future appointment . Patient will take medications as prescribed . Patient will report any questions or concerns to provider   Please see past updates related to this goal by clicking on the "Past Updates" button in the selected goal      . "I'm having trouble with pain" (pt-stated)       Current Barriers:  . Polypharmacy; complex patient with multiple comorbidities including chronic pain, osteoarthritis, T2DM, HTN . Reports current struggles with hip OA pain and back pain. Currently taking oxycodone 10 mg PRN, ibuprofen 600 mg PRN, cyclobenzaprine PRN back spasms, nortriptyline 20 mg QPM; OTC Voltaren gel and OTC capsaicin cream . Per chart review, patient last seen by Pain Clinic 05/31/2018. Was due for f/u in 08/2018.  Marland Kitchen Today, reports that he fell on his L hip this past Saturday (~5 days prior). Noted that he had a difficult time getting back up, and had to use someone else's walker to get up. Notes his pain was almost too bad to come to clinic today; took oxycodone and ibuprofen and still had a difficult time standing up and sitting down  Pharmacist Clinical Goal(s):  Marland Kitchen Over the next 90 days, patient will work with  PharmD and provider towards optimized medication management  Interventions: . Collaborated w/ clinic staff; unfortunately, no providers had openings to be able to see him to evaluate post-fall today or tomorrow. I recommended patient seek care at Urgent Care. He verbalized understanding.  . Recommended he contact Pain Clinic to schedule follow up. He verbalized understanding and agreement.  . Consider daily meloxicam therapy to assist in inflammatory pain. Patient's renal function appropriate for initiation.   Patient Self Care Activities:   . Patient will take medications as prescribed  Please see past updates related to this goal by clicking on the "Past Updates" button in the selected goal         Print copy of patient instructions provided.   Plan:  - Will collaborate with Dr. [name] regarding results of labwork once available.   [name], PharmD, [place] Pharmacist [facility] [phone]

## 2018-11-17 NOTE — Chronic Care Management (AMB) (Signed)
Chronic Care Management   Follow Up Note   11/17/2018 Name: Alan Schmidt MRN: [id number] DOB: 1945/06/21  Referred by: [name], MD Reason for referral : Chronic Care Management (Medication Management)   Alan Schmidt is a 73 y.o. year old male who is a primary care patient of [name], [name], MD. The CCM team was consulted for assistance with chronic disease management and care coordination needs.    Met with patient face to face for medication management review today.   Review of patient status, including review of consultants reports, relevant laboratory and other test results, and collaboration with appropriate care team members and the patient's provider was performed as part of comprehensive patient evaluation and provision of chronic care management services.    SDOH (Social Determinants of Health) screening performed today: Stress Physical Activity. See Care Plan for related entries.   Advanced Directives Status: N See Care Plan and Vynca application for related entries.  Outpatient Encounter Medications as of 11/17/2018  Medication Sig Note  . amLODipine (NORVASC) 5 MG tablet TAKE 1 TABLET BY MOUTH EVERY DAY   . aspirin EC 81 MG tablet Take 81 mg by mouth daily.   . carvedilol (COREG) 25 MG tablet TAKE 1 TABLET BY MOUTH TWICE A DAY   . Continuous Blood Gluc Receiver (FREESTYLE LIBRE 14 DAY READER) DEVI 1 Device by Does not apply route daily. Use to scan to check blood sugar up to 7 times daily; E11.42,   . Continuous Blood Gluc Sensor (FREESTYLE LIBRE 14 DAY SENSOR) MISC 1 Device by Does not apply route every 14 (fourteen) days. E11.42   . escitalopram (LEXAPRO) 10 MG tablet Take 1 tablet (10 mg total) by mouth daily.   . hydrochlorothiazide (HYDRODIURIL) 25 MG tablet Take 1 tablet (25 mg total) by mouth daily.   Marland Kitchen ibuprofen (ADVIL) 600 MG tablet Take 1 tablet (600 mg total) by mouth every 8 (eight) hours as needed for mild pain. Take with food.   .  insulin degludec (TRESIBA FLEXTOUCH) 100 UNIT/ML SOPN FlexTouch Pen INJECT 40 UNITS INTO THE SKIN DAILY AT 10 PM. 05/16/2018: 46 units daily  . insulin lispro (HUMALOG KWIKPEN) 100 UNIT/ML KwikPen INJECT 0.12-0.18 MLS (12-18 UNITS TOTAL) INTO THE SKIN 2 (TWO) TIMES DAILY WITH A MEAL. 11/17/2018: 10 units  . JARDIANCE 25 MG TABS tablet TAKE 1 TABLET (25 MG) BY MOUTH DAILY.   Marland Kitchen loratadine (CLARITIN) 10 MG tablet Take 1 tablet (10 mg total) by mouth daily.   . magnesium 30 MG tablet Take 1 tablet (30 mg total) by mouth 2 (two) times daily.   . metFORMIN (GLUCOPHAGE) 1000 MG tablet TAKE 1 TABLET (1,000 MG TOTAL) BY MOUTH 2 (TWO) TIMES DAILY WITH A MEAL.   Marland Kitchen nortriptyline (PAMELOR) 10 MG capsule Take 20 mg by mouth at bedtime.    Marland Kitchen olmesartan (BENICAR) 40 MG tablet TAKE 1 TABLET BY MOUTH EVERY DAY   . Oxycodone HCl 10 MG TABS Take 10 mg by mouth 2 (two) times daily as needed.   . pantoprazole (PROTONIX) 40 MG tablet TAKE 1 TABLET BY MOUTH EVERY DAY   . pravastatin (PRAVACHOL) 40 MG tablet TAKE 1 TABLET BY MOUTH DAILY   . tamsulosin (FLOMAX) 0.4 MG CAPS capsule TAKE 1 CAPSULE (0.4 MG TOTAL) BY MOUTH 2 (TWO) TIMES DAILY.   Marland Kitchen VICTOZA 18 MG/3ML SOPN INJECT 0.3 MLS (1.8 MG TOTAL) INTO THE SKIN DAILY.   Marland Kitchen acetaminophen (TYLENOL) 500 MG tablet Take 500 mg by mouth  every 6 (six) hours as needed. 05/16/2018: PRN  . ALPRAZolam (XANAX) 1 MG tablet TAKE 0.5 TABLETS (0.5 MG TOTAL) BY MOUTH 2 (TWO) TIMES DAILY AS NEEDED FOR ANXIETY.   . BD PEN NEEDLE NANO U/F 32G X 4 MM MISC USE EVERY DAY   . cyclobenzaprine (FLEXERIL) 10 MG tablet Take 1 tablet (10 mg total) by mouth 3 (three) times daily as needed for muscle spasms. Must last 30 days. 09/08/2018: Taking 2-3x/week for leg spasms  . desoximetasone (TOPICORT) 0.25 % cream APPLY CREAM TO AFFECTED AREA TWO TIMES DAILY, FOR UP TO 7 DAYS, DO NOT APPLY TO FACE (Patient not taking: Reported on 11/17/2018)   . hydrOXYzine (ATARAX/VISTARIL) 50 MG tablet TAKE 1 TABLET BY MOUTH  FOUR TIMES A DAY AS NEEDED (Patient not taking: Reported on 10/13/2018)   . mometasone (NASONEX) 50 MCG/ACT nasal spray PLACE 2 SPRAYS INTO THE NOSE DAILY.   . naloxone (NARCAN) 2 MG/2ML injection Inject content of syringe into thigh muscle. Call 911.    No facility-administered encounter medications on file as of 11/17/2018.      Goals Addressed            This Visit's Progress     Patient Stated   . "I want to keep an eye on my diabetes" (pt-stated)       Current Barriers:  . Diabetes: uncontrolled though improved per CGM results; most recent A1c 8.5%.  o Discrepancy between CGM glucose results and A1c lab result  . Current antihyperglycemic regimen: metformin 1000 mg BID, Jardiance 25 mg daily, Victoza 1.8 mg daily, Tresiba 64 units daily, Humalog 10 with meals 3x daily . Current blood glucose readings: per CGM download of the past 90 days  o Target range 70-180: 83% of the time; high 181-250: 16% of the time; very high >250 1% of the time o Glucose Management Indicator: 6.8% o Average glucose: 144 . Cardiovascular risk reduction: o Current hypertensive regimen: amlodipine 5 mg daily, carvedilol 25 mg BID, HCTZ 25 mg daily, olmesartan 40 mg daily; not checking BP at home as he does not have a working meter;  o Current hyperlipidemia regimen: pravastatin 40 mg daily; LDL well controlled last check <70  Pharmacist Clinical Goal(s):  Marland Kitchen Over the next 90 days, patient with work with PharmD and primary care provider to address optimized diabetes management  Interventions: . Ordered CBC to evaluate for anemia-related causes of A1c/CGM mismatch. Ordered fructosamine to evaluate glucose. Will discuss results with Dr. [name] to determine next steps . Reviewed CGM application technique. Patient has been placing over his bicep on front of arm, instead of SQ tissue on back of arm, to prevent sensor from popping off when he rolls over in bed. Recommended he place on SQ tissue on inside of  arm, in case proximity to muscle tissue is impact CGM accuracy.  . Reviewed for interfering substances that would impact CGM accuracy. Patient denies use of ASA outside of daily 81 mg tab. Denies frequent APAP use.   Patient Self Care Activities:  . Patient will check blood glucose with CGM, document, and provide at future appointment . Patient will take medications as prescribed . Patient will report any questions or concerns to provider   Please see past updates related to this goal by clicking on the "Past Updates" button in the selected goal      . "I'm having trouble with pain" (pt-stated)       Current Barriers:  . Polypharmacy; complex patient with  multiple comorbidities including chronic pain, osteoarthritis, T2DM, HTN . Reports current struggles with hip OA pain and back pain. Currently taking oxycodone 10 mg PRN, ibuprofen 600 mg PRN, cyclobenzaprine PRN back spasms, nortriptyline 20 mg QPM; OTC Voltaren gel and OTC capsaicin cream . Per chart review, patient last seen by Pain Clinic 05/31/2018. Was due for f/u in 08/2018.  Marland Kitchen Today, reports that he fell on his L hip this past Saturday (~5 days prior). Noted that he had a difficult time getting back up, and had to use someone else's walker to get up. Notes his pain was almost too bad to come to clinic today; took oxycodone and ibuprofen and still had a difficult time standing up and sitting down  Pharmacist Clinical Goal(s):  Marland Kitchen Over the next 90 days, patient will work with PharmD and provider towards optimized medication management  Interventions: . Collaborated w/ clinic staff; unfortunately, no providers had openings to be able to see him to evaluate post-fall today or tomorrow. I recommended patient seek care at Urgent Care. He verbalized understanding.  . Recommended he contact Pain Clinic to schedule follow up. He verbalized understanding and agreement.  . Consider daily meloxicam therapy to assist in inflammatory pain. Patient's  renal function appropriate for initiation.   Patient Self Care Activities:  . Patient will take medications as prescribed  Please see past updates related to this goal by clicking on the "Past Updates" button in the selected goal          Plan:  - Will collaborate with Dr. [name] regarding results of labwork once available.   [name], PharmD, [place] Pharmacist [facility] [phone]

## 2018-11-18 ENCOUNTER — Telehealth: Payer: Self-pay | Admitting: Family Medicine

## 2018-11-18 LAB — CBC
HCT: 46.4 % (ref 39.0–52.0)
Hemoglobin: 14.8 g/dL (ref 13.0–17.0)
MCHC: 32 g/dL (ref 30.0–36.0)
MCV: 87.8 fl (ref 78.0–100.0)
Platelets: 250 10*3/uL (ref 150.0–400.0)
RBC: 5.28 Mil/uL (ref 4.22–5.81)
RDW: 15.6 % — ABNORMAL HIGH (ref 11.5–15.5)
WBC: 7.8 10*3/uL (ref 4.0–10.5)

## 2018-11-18 NOTE — Telephone Encounter (Signed)
Please contact the patient and see how he is doing with his hip pain after his fall. Please see if he went to urgent care. Thanks.

## 2018-11-18 NOTE — Progress Notes (Signed)
I have reviewed the above note and agree. I was available to the pharmacist for consultation. I will have someone contact the patient to follow-up on his hip pain.   [name], MD

## 2018-11-21 ENCOUNTER — Telehealth: Payer: Self-pay

## 2018-11-21 NOTE — Telephone Encounter (Signed)
Called patient back. No answer. Left voicemail.

## 2018-11-21 NOTE — Telephone Encounter (Signed)
The patient left a vm stating he had a fall a week ago and is having a lot of pain and difficulty walking. He wants to get an RFA. Please advise on what to schedule for him.

## 2018-11-22 ENCOUNTER — Encounter: Payer: Self-pay | Admitting: *Deleted

## 2018-11-22 NOTE — Telephone Encounter (Signed)
I called and left a message on voicemail informing the patient that I was checking on him.  [name],cma

## 2018-11-23 NOTE — Telephone Encounter (Signed)
Attempted to call patient, message left. 

## 2018-11-23 NOTE — Telephone Encounter (Signed)
Noted.  If he continues to have discomfort he should be seen in clinic.  Please find out who contacted him regarding the amitriptyline.  It does not appear that this came from our office.  It also looks like we have nortriptyline in our system instead of amitriptyline.  Please clarify which 1 of these he is taking.  Thanks.

## 2018-11-23 NOTE — Telephone Encounter (Signed)
Patient returned my call form yesterday and stated he did have a pretty bad fall and he did not go to urgent care. He states the pain is some what better but it seems to be more muscular pain than anything.  He also states he received a message about his amitriptyline and he is taking 2 tablets in the evening.  [name],cma

## 2018-11-24 NOTE — Telephone Encounter (Signed)
lmtcb left a message stating to ask for Alan Schmidt,cma

## 2018-11-24 NOTE — Telephone Encounter (Signed)
Attempted to call patient, message left. 

## 2018-11-24 NOTE — Telephone Encounter (Signed)
I called and scheduled the patient for a visit tomorrow about his hip pain and the medication is nortriptyline and the patient states Dr. [name] does not write this medication for him.  Donatello Kleve,cma

## 2018-11-24 NOTE — Telephone Encounter (Signed)
The patient called back, saying he missed the call. Please call back

## 2018-11-24 NOTE — Telephone Encounter (Signed)
Pt returned call to [place].  Per FC send message.

## 2018-11-24 NOTE — Telephone Encounter (Signed)
Talked with patient. He states he is having increased pain in his lower back 7/10. Pain radiates to hips bilateral and down back of legs bilateral to top of knees. He is taking his medications and stretching and not having much relief. He would like a procedure. Patient was asking about an RF. Do you think this would be benifical or another procedure. Will call him back and notify him. thanks

## 2018-11-25 ENCOUNTER — Encounter: Payer: Self-pay | Admitting: Family Medicine

## 2018-11-25 ENCOUNTER — Ambulatory Visit (INDEPENDENT_AMBULATORY_CARE_PROVIDER_SITE_OTHER): Payer: Medicare Other

## 2018-11-25 ENCOUNTER — Other Ambulatory Visit: Payer: Self-pay

## 2018-11-25 ENCOUNTER — Ambulatory Visit (INDEPENDENT_AMBULATORY_CARE_PROVIDER_SITE_OTHER): Payer: Medicare Other | Admitting: Family Medicine

## 2018-11-25 VITALS — BP 118/70 | HR 95 | Temp 98.3°F | Ht 73.0 in | Wt 266.4 lb

## 2018-11-25 DIAGNOSIS — M25551 Pain in right hip: Secondary | ICD-10-CM | POA: Diagnosis not present

## 2018-11-25 DIAGNOSIS — S79911A Unspecified injury of right hip, initial encounter: Secondary | ICD-10-CM | POA: Diagnosis not present

## 2018-11-25 DIAGNOSIS — Z471 Aftercare following joint replacement surgery: Secondary | ICD-10-CM | POA: Diagnosis not present

## 2018-11-25 DIAGNOSIS — Z96641 Presence of right artificial hip joint: Secondary | ICD-10-CM | POA: Diagnosis not present

## 2018-11-25 LAB — FRUCTOSAMINE: Fructosamine: 301 umol/L — ABNORMAL HIGH (ref 205–285)

## 2018-11-25 MED ORDER — MELOXICAM 7.5 MG PO TABS: 7.5000 mg | ORAL_TABLET | Freq: Every day | ORAL | 0 refills | Status: DC

## 2018-11-25 NOTE — Patient Instructions (Signed)
Nice to see you. Get an x-ray. Please try the meloxicam for your chronic pain.  Do not take ibuprofen or Aleve with this.  Please take the meloxicam with food.  If it upsets your stomach please let us know.

## 2018-11-25 NOTE — Telephone Encounter (Signed)
Pt lvm stating that his right side hurts more than the left side. Please return the pts call.                                            Thanks

## 2018-11-26 NOTE — Assessment & Plan Note (Signed)
Suspect musculoskeletal strain and soft tissue injury though we will obtain an x-ray to rule out underlying issues with his replacement.  We will switch him from ibuprofen to meloxicam.  Discussed taking this with food and if it upsets his stomach he needs to let us know.

## 2018-11-26 NOTE — Progress Notes (Signed)
  [name], MD Phone: [phone]  Alan Schmidt is a 73 y.o. male who presents today for same-day visit.  Right hip pain: Patient reports a fall about 3 weeks ago where he slipped walking into his lobby.  He caught himself on his right side and did not have a direct hit to his right hip though his right hip and thigh have been bothering him since then.  It is progressively improving though he does still have some pain.  He has been taking ibuprofen, and using Voltaren and capsaicin topically.  Notes some pain with walking.  Some pain down his hamstring.  He is status post right hip replacement and revision.  Social History   Tobacco Use  Smoking Status Former Smoker  Smokeless Tobacco Never Used     ROS see history of present illness  Objective  Physical Exam Vitals:   11/25/18 1528  BP: 118/70  Pulse: 95  Temp: 98.3 F (36.8 C)  SpO2: 97%    BP Readings from Last 3 Encounters:  11/25/18 118/70  05/03/18 (!) 148/80  03/26/18 (!) 139/100   Wt Readings from Last 3 Encounters:  11/25/18 266 lb 6.4 oz (120.8 kg)  09/09/18 260 lb (117.9 kg)  03/26/18 250 lb (113.4 kg)    Physical Exam Constitutional:      General: He is not in acute distress. Pulmonary:     Effort: Pulmonary effort is normal.  Musculoskeletal:     Comments: Right hip with postsurgical scar, no tenderness laterally or anteriorly of the hip, good internal and external range of motion of the right hip with no pain  Neurological:     Mental Status: He is alert.      Assessment/Plan: Please see individual problem list.  Right hip pain Suspect musculoskeletal strain and soft tissue injury though we will obtain an x-ray to rule out underlying issues with his replacement.  We will switch him from ibuprofen to meloxicam.  Discussed taking this with food and if it upsets his stomach he needs to let us know.   Orders Placed This Encounter  Procedures  . DG Hip Unilat W OR W/O Pelvis 2-3 Views  Right    Standing Status:   Future    Number of Occurrences:   1    Standing Expiration Date:   01/25/2020    Order Specific Question:   Reason for Exam (SYMPTOM  OR DIAGNOSIS REQUIRED)    Answer:   fall 3 weeks ago, persistent hip pain, s/p hip replacement    Order Specific Question:   Preferred imaging location?    Answer:   [facility] Specific Question:   Radiology Contrast Protocol - do NOT remove file path    Answer:   \\[email or web address].[email or web address]    Meds ordered this encounter  Medications  . meloxicam (MOBIC) 7.5 MG tablet    Sig: Take 1 tablet (7.5 mg total) by mouth daily.    Dispense:  30 tablet    Refill:  0     [name], MD [place]

## 2018-11-28 ENCOUNTER — Telehealth: Payer: Self-pay | Admitting: Family Medicine

## 2018-11-28 NOTE — Telephone Encounter (Signed)
Pt given xray results per notes of Dr [name] on 11/25/2018. Pt verbalized understanding.

## 2018-11-28 NOTE — Telephone Encounter (Signed)
I called pt and left a vm to call ofc to schedule Return in about 3 months (around 02/25/2019).

## 2018-11-30 NOTE — Telephone Encounter (Signed)
The patient called back again. Can we please address this today??

## 2018-12-01 ENCOUNTER — Ambulatory Visit: Payer: Medicare Other | Attending: Pain Medicine | Admitting: Pain Medicine

## 2018-12-01 ENCOUNTER — Other Ambulatory Visit: Payer: Self-pay

## 2018-12-01 ENCOUNTER — Other Ambulatory Visit: Payer: Self-pay | Admitting: Pain Medicine

## 2018-12-01 DIAGNOSIS — M7918 Myalgia, other site: Secondary | ICD-10-CM | POA: Diagnosis not present

## 2018-12-01 DIAGNOSIS — M47816 Spondylosis without myelopathy or radiculopathy, lumbar region: Secondary | ICD-10-CM | POA: Diagnosis not present

## 2018-12-01 DIAGNOSIS — G894 Chronic pain syndrome: Secondary | ICD-10-CM | POA: Diagnosis not present

## 2018-12-01 DIAGNOSIS — M5136 Other intervertebral disc degeneration, lumbar region: Secondary | ICD-10-CM

## 2018-12-01 DIAGNOSIS — M5442 Lumbago with sciatica, left side: Secondary | ICD-10-CM | POA: Diagnosis not present

## 2018-12-01 DIAGNOSIS — M5441 Lumbago with sciatica, right side: Secondary | ICD-10-CM

## 2018-12-01 DIAGNOSIS — G8929 Other chronic pain: Secondary | ICD-10-CM

## 2018-12-01 DIAGNOSIS — T402X5A Adverse effect of other opioids, initial encounter: Secondary | ICD-10-CM

## 2018-12-01 DIAGNOSIS — K5903 Drug induced constipation: Secondary | ICD-10-CM

## 2018-12-01 MED ORDER — BENEFIBER PO POWD: 6.0000 g | Freq: Three times a day (TID) | ORAL | 8 refills | Status: DC

## 2018-12-01 MED ORDER — LUBIPROSTONE 8 MCG PO CAPS: 8.0000 ug | ORAL_CAPSULE | Freq: Two times a day (BID) | ORAL | 5 refills | Status: DC

## 2018-12-01 MED ORDER — OXYCODONE HCL 10 MG PO TABS: 10.0000 mg | ORAL_TABLET | Freq: Four times a day (QID) | ORAL | 0 refills | Status: DC | PRN

## 2018-12-01 MED ORDER — CYCLOBENZAPRINE HCL 10 MG PO TABS: 10.0000 mg | ORAL_TABLET | Freq: Three times a day (TID) | ORAL | 2 refills | Status: DC | PRN

## 2018-12-01 NOTE — Progress Notes (Signed)
Pain Management Virtual Encounter Note - Virtual Visit via Telephone Telehealth (real-time audio visits between healthcare provider and patient).   Patient's Phone No. & Preferred Pharmacy:  [phone] (home); [phone] (mobile); (Preferred) [phone] [email or web address]  [id number] Lorina Rabon, [facility] - [place] [address] [place] [address] Phone: [phone] Fax: [phone]    Pre-screening note:  Our staff contacted Alan Schmidt and offered him an "in person", "face-to-face" appointment versus a telephone encounter. He indicated preferring the telephone encounter, at this time.   Reason for Virtual Visit: COVID-19*  Social distancing based on CDC and AMA recommendations.   I contacted Alan Schmidt on 12/01/2018 via telephone.      I clearly identified myself as Gaspar Cola, MD. I verified that I was speaking with the correct person using two identifiers (Name: Alan Schmidt, and date of birth: [birth year]).  Advanced Informed Consent I sought verbal advanced consent from Alan Schmidt for virtual visit interactions. I informed Alan Schmidt of possible security and privacy concerns, risks, and limitations associated with providing "not-in-person" medical evaluation and management services. I also informed Alan Schmidt of the availability of "in-person" appointments. Finally, I informed him that there would be a charge for the virtual visit and that he could be  personally, fully or partially, financially responsible for it. Alan Schmidt expressed understanding and agreed to proceed.   Historic Elements   Alan Schmidt is a 73 y.o. year old, male patient evaluated today after his last encounter by our practice on 11/21/2018. Alan Schmidt  has a past medical history of Acute postoperative pain (11/24/2016), Anxiety, Chronic hip pain (Right) (12/05/2014), Chronic lumbar pain, Depression, Hyperlipidemia, Hypertension, Kidney stones,  and Migraines. He also  has a past surgical history that includes Total hip arthroplasty; Tonsillectomy; and right hip surgery. Alan Schmidt has a current medication list which includes the following prescription(s): acetaminophen, alprazolam, amlodipine, aspirin ec, bd pen needle nano u/f, carvedilol, freestyle libre 14 day reader, freestyle libre 14 day sensor, cyclobenzaprine, desoximetasone, escitalopram, hydrochlorothiazide, hydroxyzine, insulin degludec, insulin lispro, jardiance, loratadine, lubiprostone, magnesium, meloxicam, metformin, mometasone, naloxone, nortriptyline, olmesartan, oxycodone hcl, oxycodone hcl, oxycodone hcl, pantoprazole, pravastatin, tamsulosin, victoza, and benefiber. He  reports that he has quit smoking. He has never used smokeless tobacco. He reports that he does not drink alcohol or use drugs. Alan Schmidt is allergic to contrast media [iodinated diagnostic agents]; iodine; and shellfish allergy.   HPI  Today, he is being contacted for medication management.  Pharmacotherapy Assessment  Analgesic: Oxycodone IR 10 mg every 6 hours (40 mg/day) MME/day:60 mg/day.   Monitoring: Pharmacotherapy: No side-effects or adverse reactions reported. [place] PMP: PDMP reviewed during this encounter.       Compliance: No problems identified. Effectiveness: Clinically acceptable. Plan: Refer to "POC".  UDS:  Summary  Date Value Ref Range Status  03/03/2018 FINAL  Final    Comment:    ==================================================================== TOXASSURE SELECT 13 (MW) ==================================================================== Test                             Result       Flag       Units Drug Present and Declared for Prescription Verification   Alprazolam                     53           EXPECTED   ng/mg creat   Alpha-hydroxyalprazolam  40           EXPECTED   ng/mg creat    Source of alprazolam is a scheduled prescription medication.     Alpha-hydroxyalprazolam is an expected metabolite of alprazolam.   Oxycodone                      1682         EXPECTED   ng/mg creat   Oxymorphone                    262          EXPECTED   ng/mg creat   Noroxycodone                   1203         EXPECTED   ng/mg creat    Sources of oxycodone include scheduled prescription medications.    Oxymorphone and noroxycodone are expected metabolites of    oxycodone. Oxymorphone is also available as a scheduled    prescription medication. Drug Present not Declared for Prescription Verification   Alcohol, Ethyl                 0.052        UNEXPECTED g/dL    Sources of ethyl alcohol include alcoholic beverages or as a    fermentation product of glucose; glucose is present in this    specimen.  Interpret result with caution, as the presence of    ethyl alcohol is likely due, at least in part, to fermentation of    glucose. ==================================================================== Test                      Result    Flag   Units      Ref Range   Creatinine              60               mg/dL      >=20 ==================================================================== Declared Medications:  The flagging and interpretation on this report are based on the  following declared medications.  Unexpected results may arise from  inaccuracies in the declared medications.  **Note: The testing scope of this panel includes these medications:  Alprazolam  Oxycodone  **Note: The testing scope of this panel does not include following  reported medications:  Acetaminophen  Amlodipine Besylate  Aspirin (Aspirin 81)  Carvedilol  Cyclobenzaprine  Desoximetasone TOPICAL  Empagliflozin  Escitalopram  Hydrochlorothiazide  Hydroxyzine  Insulin (Humalog)  Insulin Tyler Aas)  Liraglutide  Loratadine  Magnesium  Metformin  Mometasone  Naloxone  Olmesartan  Pantoprazole  Pravastatin   Tamsulosin ==================================================================== For clinical consultation, please call [phone]. ====================================================================    Laboratory Chemistry Profile (12 mo)  Renal: 10/26/2018: BUN 12; Creatinine, Ser 1.04  Lab Results  Component Value Date   GFR 84.78 10/26/2018   GFRAA >60 03/26/2018   GFRNONAA >60 03/26/2018   Hepatic: 10/26/2018: Albumin 4.0 Lab Results  Component Value Date   AST 23 10/26/2018   ALT 25 10/26/2018   Other: No results found for requested labs within last 8760 hours. Note: Above Lab results reviewed.  Imaging  Last 90 days:  Dg Hip Unilat W Or W/o Pelvis 2-3 Views Right  Result Date: 11/25/2018 CLINICAL DATA:  Right hip pain after fall 3 weeks ago. EXAM: DG HIP (WITH OR WITHOUT PELVIS) 2-3V RIGHT COMPARISON:  May 03, 2018. FINDINGS:  Status post right total hip arthroplasty. The femoral and acetabular components appear to be well situated. No fracture or dislocation is noted. IMPRESSION: Status post right total hip arthroplasty. No acute abnormality is noted. Electronically Signed   By: Marijo Conception M.D.   On: 11/25/2018 16:55    Assessment  The primary encounter diagnosis was Lumbar facet syndrome (Bilateral) (R>L). Diagnoses of Chronic pain syndrome, Chronic low back pain (Primary Source of Pain) (Bilateral) (R>L), DDD (degenerative disc disease), lumbar, Lumbar facet joint osteoarthritis (Bilateral), Lumbar spondylosis, Chronic musculoskeletal pain, and Opioid-induced constipation (OIC) were also pertinent to this visit.  Plan of Care  I have discontinued Alan Schmidt's Oxycodone HCl, Oxycodone HCl, and Oxycodone HCl. I have also changed his Oxycodone HCl. Additionally, I am having him start on Oxycodone HCl, Oxycodone HCl, lubiprostone, and Benefiber. Lastly, I am having him maintain his naloxone, hydrOXYzine, BD Pen Needle Nano U/F, acetaminophen, aspirin EC,  amLODipine, insulin degludec, Victoza, loratadine, FreeStyle Libre 14 Day Reader, FreeStyle Libre 14 Day Sensor, magnesium, desoximetasone, metFORMIN, escitalopram, nortriptyline, insulin lispro, mometasone, Jardiance, ALPRAZolam, tamsulosin, olmesartan, pravastatin, carvedilol, pantoprazole, hydrochlorothiazide, meloxicam, and cyclobenzaprine.  Pharmacotherapy (Medications Ordered): Meds ordered this encounter  Medications  . cyclobenzaprine (FLEXERIL) 10 MG tablet    Sig: Take 1 tablet (10 mg total) by mouth 3 (three) times daily as needed for muscle spasms. Must last 30 days.    Dispense:  90 tablet    Refill:  2    Fill one day early if pharmacy is closed on scheduled refill date. May substitute for generic if available.  . Oxycodone HCl 10 MG TABS    Sig: Take 1 tablet (10 mg total) by mouth every 6 (six) hours as needed. Must last 30 days    Dispense:  120 tablet    Refill:  0    Chronic Pain: STOP Act (Not applicable) Fill 1 day early if closed on refill date. Do not fill until: 12/01/2018. To last until: 12/31/2018. Avoid benzodiazepines within 8 hours of opioids  . Oxycodone HCl 10 MG TABS    Sig: Take 1 tablet (10 mg total) by mouth every 6 (six) hours as needed. Must last 30 days    Dispense:  120 tablet    Refill:  0    Chronic Pain: STOP Act (Not applicable) Fill 1 day early if closed on refill date. Do not fill until: 12/31/2018. To last until: 01/30/2019. Avoid benzodiazepines within 8 hours of opioids  . Oxycodone HCl 10 MG TABS    Sig: Take 1 tablet (10 mg total) by mouth every 6 (six) hours as needed. Must last 30 days    Dispense:  120 tablet    Refill:  0    Chronic Pain: STOP Act (Not applicable) Fill 1 day early if closed on refill date. Do not fill until: 01/30/2019. To last until: 03/01/2019. Avoid benzodiazepines within 8 hours of opioids  . lubiprostone (AMITIZA) 8 MCG capsule    Sig: Take 1 capsule (8 mcg total) by mouth 2 (two) times daily with a meal. Swallow the  medication whole. Do not break or chew the medication.    Dispense:  60 capsule    Refill:  5    Fill one day early if pharmacy is closed on scheduled refill date. May substitute for generic if available.  . Wheat Dextrin (BENEFIBER) POWD    Sig: Take 6 g by mouth 3 (three) times daily before meals. (2 tsp = 6 g)  Dispense:  730 g    Refill:  8    Fill one day early if pharmacy is closed on scheduled refill date. May substitute for generic if available.   Orders:  No orders of the defined types were placed in this encounter.  Follow-up plan:   Return in about 3 months (around 03/01/2019) for (VV), (MM), in addition, RFA (w/ sedation): (R) L-FCT RFA #6, (ASAP).      Interventional management options: Planned, scheduled, and/or pending:   Therapeutic caudal epidural steroid injection under fluoroscopic guidance and IV sedation   Considering:   Diagnostic Left GONB  Possible Left GON RFA    Palliative PRN treatment(s):   Palliative right lumbar facet block #3  Palliative left lumbar facet block #3  Therapeutic left cervical ESI #2  Palliative left lumbar facet RFA #2 (last done on 02/16/2017) Palliative right lumbar facet RFA #5 (last done on 01/05/2018)    Recent Visits No visits were found meeting these conditions.  Showing recent visits within past 90 days and meeting all other requirements   Today's Visits Date Type Provider Dept  12/01/18 Office Visit Milinda Pointer, MD [facility]  Showing today's visits and meeting all other requirements   Future Appointments No visits were found meeting these conditions.  Showing future appointments within next 90 days and meeting all other requirements   I discussed the assessment and treatment plan with the patient. The patient was provided an opportunity to ask questions and all were answered. The patient agreed with the plan and demonstrated an understanding of the instructions.  Patient advised to call back or seek  an in-person evaluation if the symptoms or condition worsens.  Total duration of non-face-to-face encounter: 15 minutes.  Note by: Gaspar Cola, MD Date: 12/01/2018; Time: 11:36 AM  Note: This dictation was prepared with Dragon dictation. Any transcriptional errors that may result from this process are unintentional.  Disclaimer:  * Given the special circumstances of the COVID-19 pandemic, the federal government has announced that the Office for Civil Rights (OCR) will exercise its enforcement discretion and will not impose penalties on physicians using telehealth in the event of noncompliance with regulatory requirements under the [place] (HIPAA) in connection with the good faith provision of telehealth during the XX123456 national public health emergency. ([place])

## 2018-12-01 NOTE — Patient Instructions (Signed)

## 2018-12-09 ENCOUNTER — Other Ambulatory Visit: Payer: Self-pay | Admitting: Family Medicine

## 2018-12-11 DIAGNOSIS — Z794 Long term (current) use of insulin: Secondary | ICD-10-CM | POA: Diagnosis not present

## 2018-12-11 DIAGNOSIS — E118 Type 2 diabetes mellitus with unspecified complications: Secondary | ICD-10-CM | POA: Diagnosis not present

## 2018-12-19 ENCOUNTER — Other Ambulatory Visit: Payer: Self-pay | Admitting: Family Medicine

## 2018-12-22 ENCOUNTER — Other Ambulatory Visit: Payer: Self-pay | Admitting: Family Medicine

## 2018-12-22 DIAGNOSIS — E1142 Type 2 diabetes mellitus with diabetic polyneuropathy: Secondary | ICD-10-CM

## 2018-12-22 NOTE — Telephone Encounter (Signed)
Copied from [place] [id number]. Topic: Quick Communication - Rx Refill/Question >> Dec 22, 2018  8:59 AM Rainey Pines A wrote: Medication: nortriptyline (PAMELOR) 10 MG capsule (Patient would like his medication expedited , patient is out of medication)  Has the patient contacted their pharmacy? Yes (Agent: If no, request that the patient contact the pharmacy for the refill.) (Agent: If yes, when and what did the pharmacy advise?)Contact PCP  Preferred Pharmacy (with phone number or street name): [facility] [phone] [place], [place] - [address] AT [facility] [phone] (Phone) [phone] (Fax)    Agent: Please be advised that RX refills may take up to 3 business days. We ask that you follow-up with your pharmacy.

## 2018-12-22 NOTE — Telephone Encounter (Signed)
Requested medication (s) are due for refill today: yes  Requested medication (s) are on the active medication list: yes  Last refill:  09/08/2018  Future visit scheduled: no  Notes to clinic:  Last filled by different provider than pcp. Patient is out of medication    Requested Prescriptions  Pending Prescriptions Disp Refills   nortriptyline (PAMELOR) 10 MG capsule       Sig: Take 2 capsules (20 mg total) by mouth at bedtime.     Psychiatry:  Antidepressants - Heterocyclics (TCAs) Passed - 12/22/2018  9:06 AM      Passed - Valid encounter within last 6 months    Recent Outpatient Visits          3 weeks ago Right hip pain   [place], [name], MD   3 months ago Type 2 diabetes mellitus with diabetic polyneuropathy, with long-term current use of insulin [facility])   [place], [name], MD   5 months ago Swelling of right lower extremity   [place], FNP   6 months ago Swelling of right lower extremity   [place], Jacquelynn Cree, FNP   7 months ago Essential hypertension   [place] Sonnenberg, [name], MD      Future Appointments            In 2 months Milinda Pointer, MD [place] - Completed PHQ-2 or PHQ-9 in the last 360 days.

## 2018-12-23 NOTE — Telephone Encounter (Signed)
Per review of chart, pt is followed by pain clinic.  It does not appear that Dr [name] prescribes this medication.  Per previous phone message, he does not prescribe this medication. Please notify pt/pharmacy - needs to get refilled from MD who has been prescribing.

## 2018-12-23 NOTE — Telephone Encounter (Signed)
I notified patient that this should now be filled by pain management clinic. Patient stated that he would call their office to see if they would refill for him.

## 2018-12-26 ENCOUNTER — Telehealth: Payer: Self-pay | Admitting: Pain Medicine

## 2018-12-26 ENCOUNTER — Telehealth: Payer: Medicare Other

## 2018-12-26 ENCOUNTER — Ambulatory Visit: Payer: Self-pay | Admitting: Pharmacist

## 2018-12-26 NOTE — Chronic Care Management (AMB) (Signed)
  Chronic Care Management   Note  12/26/2018 Name: Alan Schmidt MRN: [id number] DOB: [birth year]  Alan Schmidt is a 73 y.o. year old male who is a primary care patient of [name], [name], MD. The CCM team was consulted for assistance with chronic disease management and care coordination needs.    Attempted to contact patient for medication management. Left HIPAA compliant message for patient to return my call at his convenience.   Follow up plan: - If I do not hear back, will outreach again in the next 2-3 weeks  [name], PharmD, [place] Pharmacist [place] [phone]

## 2018-12-26 NOTE — Telephone Encounter (Signed)
Will discuss at appointment tomorrow.  

## 2018-12-26 NOTE — Progress Notes (Signed)
Reviewed.  Agree with plan   Dr Daylah Sayavong 

## 2018-12-26 NOTE — Telephone Encounter (Signed)
Patient wants to get refill on Noratriptyline. Has RF scheduled for Tue 11-24

## 2018-12-27 ENCOUNTER — Other Ambulatory Visit: Payer: Self-pay

## 2018-12-27 ENCOUNTER — Ambulatory Visit
Admission: RE | Admit: 2018-12-27 | Discharge: 2018-12-27 | Disposition: A | Discharge status: Home / Self Care | Payer: Medicare Other | Source: Ambulatory Visit | Attending: Pain Medicine | Admitting: Pain Medicine

## 2018-12-27 ENCOUNTER — Ambulatory Visit (HOSPITAL_BASED_OUTPATIENT_CLINIC_OR_DEPARTMENT_OTHER): Payer: Medicare Other | Admitting: Pain Medicine

## 2018-12-27 ENCOUNTER — Encounter: Payer: Self-pay | Admitting: Pain Medicine

## 2018-12-27 VITALS — BP 132/100 | HR 104 | Temp 97.1°F | Resp 15 | Ht 73.0 in | Wt 250.0 lb

## 2018-12-27 DIAGNOSIS — M47816 Spondylosis without myelopathy or radiculopathy, lumbar region: Secondary | ICD-10-CM

## 2018-12-27 DIAGNOSIS — M5136 Other intervertebral disc degeneration, lumbar region: Secondary | ICD-10-CM | POA: Diagnosis not present

## 2018-12-27 DIAGNOSIS — G8929 Other chronic pain: Secondary | ICD-10-CM | POA: Diagnosis not present

## 2018-12-27 DIAGNOSIS — M5441 Lumbago with sciatica, right side: Secondary | ICD-10-CM | POA: Diagnosis not present

## 2018-12-27 DIAGNOSIS — M5442 Lumbago with sciatica, left side: Secondary | ICD-10-CM

## 2018-12-27 DIAGNOSIS — G894 Chronic pain syndrome: Secondary | ICD-10-CM

## 2018-12-27 DIAGNOSIS — G8918 Other acute postprocedural pain: Secondary | ICD-10-CM | POA: Diagnosis not present

## 2018-12-27 DIAGNOSIS — Z91013 Allergy to seafood: Secondary | ICD-10-CM

## 2018-12-27 DIAGNOSIS — Z883 Allergy status to other anti-infective agents status: Secondary | ICD-10-CM

## 2018-12-27 DIAGNOSIS — M47817 Spondylosis without myelopathy or radiculopathy, lumbosacral region: Secondary | ICD-10-CM | POA: Insufficient documentation

## 2018-12-27 DIAGNOSIS — R55 Syncope and collapse: Secondary | ICD-10-CM | POA: Insufficient documentation

## 2018-12-27 DIAGNOSIS — Z91041 Radiographic dye allergy status: Secondary | ICD-10-CM

## 2018-12-27 MED ORDER — FENTANYL CITRATE (PF) 100 MCG/2ML IJ SOLN
25.0000 ug | INTRAMUSCULAR | Status: AC | PRN
  Administered 2018-12-27: 25 ug via INTRAVENOUS
  Filled 2018-12-27: qty 2

## 2018-12-27 MED ORDER — TRIAMCINOLONE ACETONIDE 40 MG/ML IJ SUSP
40.0000 mg | Freq: Once | INTRAMUSCULAR | Status: AC
  Administered 2018-12-27: 40 mg
  Filled 2018-12-27: qty 1

## 2018-12-27 MED ORDER — MIDAZOLAM HCL 5 MG/5ML IJ SOLN
1.0000 mg | INTRAMUSCULAR | Status: AC | PRN
  Administered 2018-12-27: 0.5 mg via INTRAVENOUS
  Filled 2018-12-27: qty 5

## 2018-12-27 MED ORDER — LACTATED RINGERS IV SOLN
1000.0000 mL | Freq: Once | INTRAVENOUS | Status: AC
  Administered 2018-12-27: 1000 mL via INTRAVENOUS

## 2018-12-27 MED ORDER — GLYCOPYRROLATE 0.2 MG/ML IJ SOLN
0.2000 mg | Freq: Once | INTRAMUSCULAR | Status: AC
  Administered 2018-12-27: 14:00:00 0.2 mg via INTRAVENOUS
  Filled 2018-12-27: qty 1

## 2018-12-27 MED ORDER — LIDOCAINE HCL 2 % IJ SOLN
20.0000 mL | Freq: Once | INTRAMUSCULAR | Status: AC
  Administered 2018-12-27: 400 mg
  Filled 2018-12-27: qty 40

## 2018-12-27 MED ORDER — ROPIVACAINE HCL 2 MG/ML IJ SOLN
9.0000 mL | Freq: Once | INTRAMUSCULAR | Status: AC
  Administered 2018-12-27: 14:00:00 9 mL via PERINEURAL
  Filled 2018-12-27: qty 10

## 2018-12-27 NOTE — Progress Notes (Signed)
Patient's Name: Alan Schmidt  MRN: [id number]  Referring Provider: Milinda Pointer, MD  DOB: 11/21/1945  PCP: [name], MD  DOS: 12/27/2018  Note by: Gaspar Cola, MD  Service setting: Ambulatory outpatient  Specialty: Interventional Pain Management  Patient type: Established  Location: [facility] (AMB) Pain Management Facility  Visit type: Interventional Procedure   Primary Reason for Visit: Interventional Pain Management Treatment. CC: Back Pain (lower)  Procedure:          Anesthesia, Analgesia, Anxiolysis:  Type: Thermal Lumbar Facet, Medial Branch Radiofrequency Ablation/Neurotomy  #5  Primary Purpose: Therapeutic Region: Posterolateral Lumbosacral Spine Level: L2, L3, L4, L5, & S1 Medial Branch Level(s). These levels will denervate the L3-4, L4-5, and the L5-S1 lumbar facet joints. Laterality: Right  Type: Moderate (Conscious) Sedation combined with Local Anesthesia Indication(s): Analgesia and Anxiety Route: Intravenous (IV) IV Access: Secured Sedation: Meaningful verbal contact was maintained at all times during the procedure  Local Anesthetic: Lidocaine 1-2%  Position: Prone   Indications: 1. Lumbar facet syndrome (Bilateral) (R>L)   2. Spondylosis without myelopathy or radiculopathy, lumbosacral region   3. Lumbar facet joint osteoarthritis (Bilateral)   4. DDD (degenerative disc disease), lumbar   5. Chronic low back pain (Primary Source of Pain) (Bilateral) (R>L)   6. Chronic pain syndrome   7. History of allergy to radiographic contrast media   8. History of Vasovagal response to spinal injections   9. History of allergy to shellfish   10. 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 has been dealing with the above chronic pain for longer than three months and has either failed to respond, was unable to tolerate, or simply did not get enough benefit from other more conservative therapies including, but not limited to: 1.  Over-the-counter medications 2. Anti-inflammatory medications 3. Muscle relaxants 4. Membrane stabilizers 5. Opioids 6. Physical therapy and/or chiropractic manipulation 7. Modalities (Heat, ice, etc.) 8. Invasive techniques such as nerve blocks. [name] has attained more than 50% relief of the pain from a series of diagnostic injections conducted in separate occasions.  Pain Score: Pre-procedure: 0-No pain/10 Post-procedure: 0-No pain/10  Pre-op Assessment:  [name] is a 73 y.o. (year old), male patient, seen today for interventional treatment. He  has a past surgical history that includes Total hip arthroplasty; Tonsillectomy; and right hip surgery. [name] has a current medication list which includes the following prescription(s): acetaminophen, alprazolam, amlodipine, aspirin ec, bd pen needle nano u/f, carvedilol, freestyle libre 14 day reader, freestyle libre 14 day sensor, cyclobenzaprine, desoximetasone, escitalopram, hydrochlorothiazide, hydroxyzine, insulin lispro, jardiance, loratadine, lubiprostone, magnesium, meloxicam, meloxicam, metformin, mometasone, naloxone, nortriptyline, olmesartan, oxycodone hcl, oxycodone hcl, oxycodone hcl, pantoprazole, pravastatin, tamsulosin, tresiba flextouch, victoza, and benefiber. His primarily concern today is the Back Pain (lower)  Initial Vital Signs:  Pulse/HCG Rate: (!) 104ECG Heart Rate: (!) 105 Temp: 97.6 F (36.4 C) Resp: 18 BP: (!) 150/81 SpO2: 97 %  BMI: Estimated body mass index is 32.98 kg/m as calculated from the following:   Height as of this encounter: 6\' 1"  (1.854 m).   Weight as of this encounter: 250 lb (113.4 kg).  Risk Assessment: Allergies: Reviewed. He is allergic to contrast media [iodinated diagnostic agents]; iodine; and shellfish allergy.  Allergy Precautions: None required Coagulopathies: Reviewed. None identified.  Blood-thinner therapy: None at this time Active Infection(s): Reviewed. None  identified. Alan Schmidt is afebrile  Site Confirmation: [name] was asked to confirm the procedure and laterality before marking the  site Procedure checklist: Completed Consent: Before the procedure and under the influence of no sedative(s), amnesic(s), or anxiolytics, the patient was informed of the treatment options, risks and possible complications. To fulfill our ethical and legal obligations, as recommended by the American Medical Association's Code of Ethics, I have informed the patient of my clinical impression; the nature and purpose of the treatment or procedure; the risks, benefits, and possible complications of the intervention; the alternatives, including doing nothing; the risk(s) and benefit(s) of the alternative treatment(s) or procedure(s); and the risk(s) and benefit(s) of doing nothing. The patient was provided information about the general risks and possible complications associated with the procedure. These may include, but are not limited to: failure to achieve desired goals, infection, bleeding, organ or nerve damage, allergic reactions, paralysis, and death. In addition, the patient was informed of those risks and complications associated to Spine-related procedures, such as failure to decrease pain; infection (i.e.: Meningitis, epidural or intraspinal abscess); bleeding (i.e.: epidural hematoma, subarachnoid hemorrhage, or any other type of intraspinal or peri-dural bleeding); organ or nerve damage (i.e.: Any type of peripheral nerve, nerve root, or spinal cord injury) with subsequent damage to sensory, motor, and/or autonomic systems, resulting in permanent pain, numbness, and/or weakness of one or several areas of the body; allergic reactions; (i.e.: anaphylactic reaction); and/or death. Furthermore, the patient was informed of those risks and complications associated with the medications. These include, but are not limited to: allergic reactions (i.e.: anaphylactic or  anaphylactoid reaction(s)); adrenal axis suppression; blood sugar elevation that in diabetics may result in ketoacidosis or comma; water retention that in patients with history of congestive heart failure may result in shortness of breath, pulmonary edema, and decompensation with resultant heart failure; weight gain; swelling or edema; medication-induced neural toxicity; particulate matter embolism and blood vessel occlusion with resultant organ, and/or nervous system infarction; and/or aseptic necrosis of one or more joints. Finally, the patient was informed that Medicine is not an exact science; therefore, there is also the possibility of unforeseen or unpredictable risks and/or possible complications that may result in a catastrophic outcome. The patient indicated having understood very clearly. We have given the patient no guarantees and we have made no promises. Enough time was given to the patient to ask questions, all of which were answered to the patient's satisfaction. Mr. Taranto has indicated that he wanted to continue with the procedure. Attestation: I, the ordering provider, attest that I have discussed with the patient the benefits, risks, side-effects, alternatives, likelihood of achieving goals, and potential problems during recovery for the procedure that I have provided informed consent. Date   Time: 12/27/2018  1:47 PM  Pre-Procedure Preparation:  Monitoring: As per clinic protocol. Respiration, ETCO2, SpO2, BP, heart rate and rhythm monitor placed and checked for adequate function Safety Precautions: Patient was assessed for positional comfort and pressure points before starting the procedure. Time-out: I initiated and conducted the "Time-out" before starting the procedure, as per protocol. The patient was asked to participate by confirming the accuracy of the "Time Out" information. Verification of the correct person, site, and procedure were performed and confirmed by me, the nursing  staff, and the patient. "Time-out" conducted as per Joint Commission's Universal Protocol (UP.01.01.01). Time: 1427  Description of Procedure:          Laterality: Right Levels:  L2, L3, L4, L5, & S1 Medial Branch Level(s), at the L3-4, L4-5, and the L5-S1 lumbar facet joints. Area Prepped: Lumbosacral Prepping solution: DuraPrep (Iodine Povacrylex [0.7% available  iodine] and Isopropyl Alcohol, 74% w/w) Safety Precautions: Aspiration looking for blood return was conducted prior to all injections. At no point did we inject any substances, as a needle was being advanced. Before injecting, the patient was told to immediately notify me if he was experiencing any new onset of "ringing in the ears, or metallic taste in the mouth". No attempts were made at seeking any paresthesias. Safe injection practices and needle disposal techniques used. Medications properly checked for expiration dates. SDV (single dose vial) medications used. After the completion of the procedure, all disposable equipment used was discarded in the proper designated medical waste containers. Local Anesthesia: Protocol guidelines were followed. The patient was positioned over the fluoroscopy table. The area was prepped in the usual manner. The time-out was completed. The target area was identified using fluoroscopy. A 12-in long, straight, sterile hemostat was used with fluoroscopic guidance to locate the targets for each level blocked. Once located, the skin was marked with an approved surgical skin marker. Once all sites were marked, the skin (epidermis, dermis, and hypodermis), as well as deeper tissues (fat, connective tissue and muscle) were infiltrated with a small amount of a short-acting local anesthetic, loaded on a 10cc syringe with a 25G, 1.5-in  Needle. An appropriate amount of time was allowed for local anesthetics to take effect before proceeding to the next step. Local Anesthetic: Lidocaine 2.0% The unused portion of the local  anesthetic was discarded in the proper designated containers. Technical explanation of process:  Radiofrequency Ablation (RFA) L2 Medial Branch Nerve RFA: The target area for the L2 medial branch is at the junction of the postero-lateral aspect of the superior articular process and the superior, posterior, and medial edge of the transverse process of L3. Under fluoroscopic guidance, a Radiofrequency needle was inserted until contact was made with os over the superior postero-lateral aspect of the pedicular shadow (target area). Sensory and motor testing was conducted to properly adjust the position of the needle. Once satisfactory placement of the needle was achieved, the numbing solution was slowly injected after negative aspiration for blood. 2.0 mL of the nerve block solution was injected without difficulty or complication. After waiting for at least 3 minutes, the ablation was performed. Once completed, the needle was removed intact. L3 Medial Branch Nerve RFA: The target area for the L3 medial branch is at the junction of the postero-lateral aspect of the superior articular process and the superior, posterior, and medial edge of the transverse process of L4. Under fluoroscopic guidance, a Radiofrequency needle was inserted until contact was made with os over the superior postero-lateral aspect of the pedicular shadow (target area). Sensory and motor testing was conducted to properly adjust the position of the needle. Once satisfactory placement of the needle was achieved, the numbing solution was slowly injected after negative aspiration for blood. 2.0 mL of the nerve block solution was injected without difficulty or complication. After waiting for at least 3 minutes, the ablation was performed. Once completed, the needle was removed intact. L4 Medial Branch Nerve RFA: The target area for the L4 medial branch is at the junction of the postero-lateral aspect of the superior articular process and the  superior, posterior, and medial edge of the transverse process of L5. Under fluoroscopic guidance, a Radiofrequency needle was inserted until contact was made with os over the superior postero-lateral aspect of the pedicular shadow (target area). Sensory and motor testing was conducted to properly adjust the position of the needle. Once satisfactory placement of  the needle was achieved, the numbing solution was slowly injected after negative aspiration for blood. 2.0 mL of the nerve block solution was injected without difficulty or complication. After waiting for at least 3 minutes, the ablation was performed. Once completed, the needle was removed intact. L5 Medial Branch Nerve RFA: The target area for the L5 medial branch is at the junction of the postero-lateral aspect of the superior articular process of S1 and the superior, posterior, and medial edge of the sacral ala. Under fluoroscopic guidance, a Radiofrequency needle was inserted until contact was made with os over the superior postero-lateral aspect of the pedicular shadow (target area). Sensory and motor testing was conducted to properly adjust the position of the needle. Once satisfactory placement of the needle was achieved, the numbing solution was slowly injected after negative aspiration for blood. 2.0 mL of the nerve block solution was injected without difficulty or complication. After waiting for at least 3 minutes, the ablation was performed. Once completed, the needle was removed intact. S1 Medial Branch Nerve RFA: The target area for the S1 medial branch is located inferior to the junction of the S1 superior articular process and the L5 inferior articular process, posterior, inferior, and lateral to the 6 o'clock position of the L5-S1 facet joint, just superior to the S1 posterior foramen. Under fluoroscopic guidance, the Radiofrequency needle was advanced until contact was made with os over the Target area. Sensory and motor testing was  conducted to properly adjust the position of the needle. Once satisfactory placement of the needle was achieved, the numbing solution was slowly injected after negative aspiration for blood. 2.0 mL of the nerve block solution was injected without difficulty or complication. After waiting for at least 3 minutes, the ablation was performed. Once completed, the needle was removed intact. Radiofrequency lesioning (ablation):  Radiofrequency Generator: NeuroTherm [id number] Sensory Stimulation Parameters: 50 Hz was used to locate & identify the nerve, making sure that the needle was positioned such that there was no sensory stimulation below 0.3 V or above 0.7 V. Motor Stimulation Parameters: 2 Hz was used to evaluate the motor component. Care was taken not to lesion any nerves that demonstrated motor stimulation of the lower extremities at an output of less than 2.5 times that of the sensory threshold, or a maximum of 2.0 V. Lesioning Technique Parameters: Standard Radiofrequency settings. (Not bipolar or pulsed.) Temperature Settings: 80 degrees C Lesioning time: 60 seconds Intra-operative Compliance: Compliant Materials & Medications: Needle(s) (Electrode/Cannula) Type: Teflon-coated, curved tip, Radiofrequency needle(s) Gauge: 20G Length: 15cm Numbing solution: 0.2% PF-Ropivacaine + Triamcinolone (40 mg/mL) diluted to a final concentration of 4 mg of Triamcinolone/mL of Ropivacaine The unused portion of the solution was discarded in the proper designated containers.  Once the entire procedure was completed, the treated area was cleaned, making sure to leave some of the prepping solution back to take advantage of its long term bactericidal properties.  Illustration of the posterior view of the lumbar spine and the posterior neural structures. Laminae of L2 through S1 are labeled. DPRL5, dorsal primary ramus of L5; DPRS1, dorsal primary ramus of S1; DPR3, dorsal primary ramus of L3; FJ, facet  (zygapophyseal) joint L3-L4; I, inferior articular process of L4; LB1, lateral branch of dorsal primary ramus of L1; IAB, inferior articular branches from L3 medial branch (supplies L4-L5 facet joint); IBP, intermediate branch plexus; MB3, medial branch of dorsal primary ramus of L3; NR3, third lumbar nerve root; S, superior articular process of L5; SAB, superior articular branches  from L4 (supplies L4-5 facet joint also); TP3, transverse process of L3.  Vitals:   12/27/18 1459 12/27/18 1509 12/27/18 1519 12/27/18 1529  BP: 109/86 134/86 (!) 138/103 (!) 132/100  Pulse:      Resp: 11 (!) 8 13 15   Temp:  (!) 97.2 F (36.2 C)  (!) 97.1 F (36.2 C)  SpO2: 97% 94% 96% 97%  Weight:      Height:        Start Time: 1427 hrs. End Time: 1459 hrs.  Imaging Guidance (Spinal):          Type of Imaging Technique: Fluoroscopy Guidance (Spinal) Indication(s): Assistance in needle guidance and placement for procedures requiring needle placement in or near specific anatomical locations not easily accessible without such assistance. Exposure Time: Please see nurses notes. Contrast: None used. Fluoroscopic Guidance: I was personally present during the use of fluoroscopy. "Tunnel Vision Technique" used to obtain the best possible view of the target area. Parallax error corrected before commencing the procedure. "Direction-depth-direction" technique used to introduce the needle under continuous pulsed fluoroscopy. Once target was reached, antero-posterior, oblique, and lateral fluoroscopic projection used confirm needle placement in all planes. Images permanently stored in EMR. Interpretation: No contrast injected. I personally interpreted the imaging intraoperatively. Adequate needle placement confirmed in multiple planes. Permanent images saved into the patient's record.  Antibiotic Prophylaxis:   Anti-infectives (From admission, onward)   None     Indication(s): None identified  Post-operative  Assessment:  Post-procedure Vital Signs:  Pulse/HCG Rate: (!) 104(!) 103 Temp: (!) 97.1 F (36.2 C) Resp: 15 BP: (!) 132/100 SpO2: 97 %  EBL: None  Complications: No immediate post-treatment complications observed by team, or reported by patient.  Note: The patient tolerated the entire procedure well. A repeat set of vitals were taken after the procedure and the patient was kept under observation following institutional policy, for this type of procedure. Post-procedural neurological assessment was performed, showing return to baseline, prior to discharge. The patient was provided with post-procedure discharge instructions, including a section on how to identify potential problems. Should any problems arise concerning this procedure, the patient was given instructions to immediately contact us, at any time, without hesitation. In any case, we plan to contact the patient by telephone for a follow-up status report regarding this interventional procedure.  Comments:  No additional relevant information.  Plan of Care  Orders:  Orders Placed This Encounter  Procedures   RFA - Lumbar Facet (Today)    Scheduling Instructions:     Side(s): Right-sided     Level: L3-4, L4-5, & L5-S1 Facets (L2, L3, L4, L5, & S1 Medial Branch Nerves)     Sedation: With Sedation     Timeframe: Today    Order Specific Question:   Where will this procedure be performed?    Answer:   [facility]   Fluoro (C-Arm) (<60 min) (No Report)    Intraoperative interpretation by procedural physician at [place].    Standing Status:   Standing    Number of Occurrences:   1    Order Specific Question:   Reason for exam:    Answer:   Assistance in needle guidance and placement for procedures requiring needle placement in or near specific anatomical locations not easily accessible without such assistance.   Consent: L-FCT (RFA)    Nursing Order: Transcribe to consent form and obtain patient  signature. Note: Always confirm laterality of pain with Mr. Grandville Silos, before procedure. Procedure: Lumbar Facet  Radiofrequency Ablation Indication/Reason: Low Back Pain, with our without leg pain, due to Facet Joint Arthralgia (Joint Pain) known as Lumbar Facet Syndrome, secondary to Lumbar, and/or Lumbosacral Spondylosis (Arthritis of the Spine), without myelopathy or radiculopathy (Nerve Damage). Provider Attestation: I, [place] Dossie Arbour, MD, (Pain Management Specialist), the physician/practitioner, attest that I have discussed with the patient the benefits, risks, side effects, alternatives, likelihood of achieving goals and potential problems during recovery for the procedure that I have provided informed consent.   Radiofrequency Tray    Equipment required: Sterile "Radiofrequency Tray"; Large hemostat (1); Small hemostat (1); Towels (6-8); 4x4 sterile sponge pack (1) Radiofrequency Needle(s): Size: Long Quantity: 5    Standing Status:   Standing    Number of Occurrences:   1    Order Specific Question:   Specify    Answer:   Radiofrequency Tray   Allergy: CONTRAST    Standing Status:   Standing    Number of Occurrences:   1   Allergy: IODINE / Shellfish    NOTE: Although It is true that patients can have allergies to shellfish and that shellfish contain iodine, most shellfish  allergies are due to two protein allergens present in the shellfish: tropomyosins and parvalbumin. Not all patients with shellfish allergies are allergic to iodine. However, as a precaution, avoid using iodine containing products.    Standing Status:   Standing    Number of Occurrences:   1   Chronic Opioid Analgesic:  Oxycodone IR 10 mg every 6 hours (40 mg/day) MME/day:60 mg/day.   Medications ordered for procedure: Meds ordered this encounter  Medications   lidocaine (XYLOCAINE) 2 % (with pres) injection 400 mg   lactated ringers infusion 1,000 mL   midazolam (VERSED) 5 MG/5ML injection 1-2 mg      Make sure Flumazenil is available in the pyxis when using this medication. If oversedation occurs, administer 0.2 mg IV over 15 sec. If after 45 sec no response, administer 0.2 mg again over 1 min; may repeat at 1 min intervals; not to exceed 4 doses (1 mg)   fentaNYL (SUBLIMAZE) injection 25-50 mcg    Make sure Narcan is available in the pyxis when using this medication. In the event of respiratory depression (RR< 8/min): Titrate NARCAN (naloxone) in increments of 0.1 to 0.2 mg IV at 2-3 minute intervals, until desired degree of reversal.   glycopyrrolate (ROBINUL) injection 0.2 mg   ropivacaine (PF) 2 mg/mL (0.2%) (NAROPIN) injection 9 mL   triamcinolone acetonide (KENALOG-40) injection 40 mg   Medications administered: We administered lidocaine, lactated ringers, midazolam, fentaNYL, glycopyrrolate, ropivacaine (PF) 2 mg/mL (0.2%), and triamcinolone acetonide.  See the medical record for exact dosing, route, and time of administration.  Follow-up plan:   Return in about 6 weeks (around 02/07/2019) for (VV), (PP).       Interventional management options: Planned, scheduled, and/or pending:   Therapeutic caudal epidural steroid injection under fluoroscopic guidance and IV sedation   Considering:   Diagnostic Left GONB  Possible Left GON RFA    Palliative PRN treatment(s):   Palliative right lumbar facet block #3  Palliative left lumbar facet block #3  Therapeutic left cervical ESI #2  Palliative left lumbar facet RFA #2 (last done on 02/16/2017) Palliative right lumbar facet RFA #5 (last done on 01/05/2018)    Recent Visits Date Type Provider Dept  12/27/18 Procedure visit Milinda Pointer, MD [facility]  12/01/18 Office Visit Milinda Pointer, MD [facility]  Showing recent  visits within past 90 days and meeting all other requirements   Future Appointments Date Type Provider Dept  02/07/19 Appointment Milinda Pointer, MD [facility]   02/27/19 Appointment Milinda Pointer, MD [facility]  Showing future appointments within next 90 days and meeting all other requirements   Disposition: Discharge home  Discharge Date & Time: 12/27/2018; 1532 hrs.   Primary Care Physician: [name], MD Location: [facility] Note by: Gaspar Cola, MD Date: 12/27/2018; Time: 5:14 AM  Disclaimer:  Medicine is not an Chief Strategy Officer. The only guarantee in medicine is that nothing is guaranteed. It is important to note that the decision to proceed with this intervention was based on the information collected from the patient. The Data and conclusions were drawn from the patient's questionnaire, the interview, and the physical examination. Because the information was provided in large part by the patient, it cannot be guaranteed that it has not been purposely or unconsciously manipulated. Every effort has been made to obtain as much relevant data as possible for this evaluation. It is important to note that the conclusions that lead to this procedure are derived in large part from the available data. Always take into account that the treatment will also be dependent on availability of resources and existing treatment guidelines, considered by other Pain Management Practitioners as being common knowledge and practice, at the time of the intervention. For Medico-Legal purposes, it is also important to point out that variation in procedural techniques and pharmacological choices are the acceptable norm. The indications, contraindications, technique, and results of the above procedure should only be interpreted and judged by a Board-Certified Interventional Pain Specialist with extensive familiarity and expertise in the same exact procedure and technique.

## 2018-12-27 NOTE — Patient Instructions (Signed)

## 2018-12-28 ENCOUNTER — Other Ambulatory Visit: Payer: Self-pay | Admitting: Pain Medicine

## 2018-12-28 ENCOUNTER — Telehealth: Payer: Self-pay

## 2018-12-28 NOTE — Telephone Encounter (Signed)
The patient called to thank [place] all for yesterday, he said he is feeling really good. He said to wish everyone a happy Thanksgiving.

## 2018-12-28 NOTE — Progress Notes (Deleted)
TEST note. Please disregard. 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 -SECANOHEADERBEGIN-LASTVISITSECTIONATEXT-SECTIONAEND Oxycodone IR 10 mg every 6 hours (40 mg/day) MME/day:60 mg/day.   TEST LINE 2: Section B CODE:  SECBNOHEADERBEGIN  SECTIONBEND  LASTVISITSECTIONBTEXTOxycodone IR 10 mg every 6 hours (40 mg/day) MME/day:60 mg/day. -SECBNOHEADERBEGIN-LASTVISITSECTIONBTEXT-SECTIONBEND Oxycodone IR 10 mg every 6 hours (40 mg/day) MME/day:60 mg/day.   TEST LINE 3: Section C CODE:  SECCNOHEADERBEGIN  SECTIONCEND  LASTVISITSECTIONCTEXTOxycodone IR 10 mg every 6 hours (40 mg/day) MME/day:60 mg/day. -SECCNOHEADERBEGIN-LASTVISITSECTIONCTEXT-SECTIONCEND Oxycodone IR 10 mg every 6 hours (40 mg/day) MME/day:60 mg/day.   TEST LINE 4: Section D CODE:  SECDNOHEADERBEGIN  SECTIONDEND  LASTVISITSECTIONDTEXTOxycodone IR 10 mg every 6 hours (40 mg/day) MME/day:60 mg/day. -SECDNOHEADERBEGIN-LASTVISITSECTIONDTEXT-SECTIONDEND Oxycodone IR 10 mg every 6 hours (40 mg/day) MME/day:60 mg/day.   TEST LINE 5: CODE:  Progress Note (SOAP) Bookmarks: Subjective section: Code to mark where to Begin:     Code to mark where to End:     Code to pull the above section from the last note into the new one: Oxycodone IR 10 mg every 6 hours (40 mg/day) MME/day:60 mg/day.   This brings it together from note to note:   Oxycodone IR 10 mg every 6 hours (40 mg/day) MME/day:60 mg/day.    Objective section: Code to mark where to Begin:     Code to mark where to End:     Code to pull the above section from the last note into the new one: {There is no content from the last Objective section.}   This brings it together from note to note:   {There is no content from the last Objective section.}    Assessment section: Code to mark where to Begin:      Code to mark where to End:     Code to pull the above section from the last note into the new one: {There is no content from the last Assessment section.}   This brings it together from note to note:   {There is no content from the last Assessment section.}    Plan section: Code to mark where to Begin:     Code to mark where to End:     Code to pull the above section from the last note into the new one:  Interventional management options: Planned, scheduled, and/or pending:   Therapeutic caudal epidural steroid injection under fluoroscopic guidance and IV sedation   Considering:   Diagnostic Left GONB  Possible Left GON RFA    Palliative PRN treatment(s):   Palliative right lumbar facet block #3  Palliative left lumbar facet block #3  Therapeutic left cervical ESI #2  Palliative left lumbar facet RFA #2 (last done on 02/16/2017) Palliative right lumbar facet RFA #5 (last done on 01/05/2018)     This brings it together from note to note:    Interventional management options: Planned, scheduled, and/or pending:   Therapeutic caudal epidural steroid injection under fluoroscopic guidance and IV sedation   Considering:   Diagnostic Left GONB  Possible Left GON RFA    Palliative PRN treatment(s):   Palliative right lumbar facet block #3  Palliative left lumbar facet block #3  Therapeutic left cervical ESI #2  Palliative left lumbar facet RFA #2 (last done on 02/16/2017) Palliative right lumbar facet RFA #5 (last done on 01/05/2018)      TEST LINE 6: CODE:  Problem-specific plan: No problem-specific Assessment & Plan notes found for this encounter.    TEST LINE 7: CODE:  No image results found.   TEST LINE 8: CODE:  1. Lumbar facet syndrome (Bilateral) (R>L) *** - RFA - Lumbar Facet (Today) - Consent: L-FCT (RFA) - Radiofrequency Tray; Standing - Fluoro (C-Arm) (<60 min) (No Report); Standing - lidocaine (XYLOCAINE) 2 % (with pres) injection 400 mg - lactated  ringers infusion 1,000 mL - midazolam (VERSED) 5 MG/5ML injection 1-2 mg - fentaNYL (SUBLIMAZE) injection 25-50 mcg - ropivacaine (PF) 2 mg/mL (0.2%) (NAROPIN) injection 9 mL - triamcinolone acetonide (KENALOG-40) injection 40 mg - Radiofrequency,Lumbar - Radiofrequency Tray - Fluoro (C-Arm) (<60 min) (No Report)  2. Spondylosis without myelopathy or radiculopathy, lumbosacral region *** - RFA - Lumbar Facet (Today) - Consent: L-FCT (RFA) - Radiofrequency Tray; Standing - Fluoro (C-Arm) (<60 min) (No Report); Standing - lidocaine (XYLOCAINE) 2 % (with pres) injection 400 mg - lactated ringers infusion 1,000 mL - midazolam (VERSED) 5 MG/5ML injection 1-2 mg - fentaNYL (SUBLIMAZE) injection 25-50 mcg - ropivacaine (PF) 2 mg/mL (0.2%) (NAROPIN) injection 9 mL - triamcinolone acetonide (KENALOG-40) injection 40 mg - Radiofrequency Tray - Fluoro (C-Arm) (<60 min) (No Report)  3. Lumbar facet joint osteoarthritis (Bilateral) *** - RFA - Lumbar Facet (Today) - Consent: L-FCT (RFA) - Radiofrequency Tray; Standing - Fluoro (C-Arm) (<60 min) (No Report); Standing - lidocaine (XYLOCAINE) 2 % (with pres) injection 400 mg - lactated ringers infusion 1,000 mL - midazolam (VERSED) 5 MG/5ML injection 1-2 mg - fentaNYL (SUBLIMAZE) injection 25-50 mcg - ropivacaine (PF) 2 mg/mL (0.2%) (NAROPIN) injection 9 mL - triamcinolone acetonide (KENALOG-40) injection 40 mg - Radiofrequency Tray - Fluoro (C-Arm) (<60 min) (No Report)  4. DDD (degenerative disc disease), lumbar *** - RFA - Lumbar Facet (Today) - Consent: L-FCT (RFA) - Radiofrequency Tray; Standing - Fluoro (C-Arm) (<60 min) (No Report); Standing - lidocaine (XYLOCAINE) 2 % (with pres) injection 400 mg - lactated ringers infusion 1,000 mL - midazolam (VERSED) 5 MG/5ML injection 1-2 mg - fentaNYL (SUBLIMAZE) injection 25-50 mcg - ropivacaine (PF) 2 mg/mL (0.2%) (NAROPIN) injection 9 mL - triamcinolone acetonide (KENALOG-40)  injection 40 mg - Radiofrequency Tray - Fluoro (C-Arm) (<60 min) (No Report)  5. Chronic low back pain (Primary Source of Pain) (Bilateral) (R>L) *** - RFA - Lumbar Facet (Today) - Consent: L-FCT (RFA) - Radiofrequency Tray; Standing - Fluoro (C-Arm) (<60 min) (No Report); Standing - lidocaine (XYLOCAINE) 2 % (with pres) injection 400 mg - lactated ringers infusion 1,000 mL - midazolam (VERSED) 5 MG/5ML injection 1-2 mg - fentaNYL (SUBLIMAZE) injection 25-50 mcg - ropivacaine (PF) 2 mg/mL (0.2%) (NAROPIN) injection 9 mL - triamcinolone acetonide (KENALOG-40) injection 40 mg - Radiofrequency Tray - Fluoro (C-Arm) (<60 min) (No Report)  6. Chronic pain syndrome *** - Oxycodone HCl 10 MG TABS; Take 1 tablet (10 mg total) by mouth every 6 (six) hours as needed. Must last 30 days  Dispense: 120 tablet; Refill: 0  7. History of allergy to radiographic contrast media *** - Allergy: CONTRAST; Standing - Allergy: CONTRAST  8. History of Vasovagal response to spinal injections *** - glycopyrrolate (ROBINUL) injection 0.2 mg  9. History of allergy to shellfish *** - Allergy: CONTRAST; Standing - Allergy: IODINE / Shellfish; Standing - Allergy: CONTRAST - Allergy: IODINE / Shellfish  10. Allergy to povidone-iodine topical antiseptic *** - Allergy: IODINE / Shellfish; Standing - Allergy: IODINE / Shellfish  11. Acute postoperative pain *** - HYDROcodone-acetaminophen (NORCO/VICODIN) 5-325  MG tablet; Take 1 tablet by mouth every 6 (six) hours as needed for up to 7 days for severe pain. Must last 7 days.  Dispense: 28 tablet; Refill: 0 - HYDROcodone-acetaminophen (NORCO/VICODIN) 5-325 MG tablet; Take 1 tablet by mouth every 6 (six) hours as needed for up to 7 days for severe pain. Must last 7 days.  Dispense: 28 tablet; Refill: 0  12. Facet syndrome, lumbar *** - RFA - Lumbar Facet (Today) - Consent: L-FCT (RFA) - Radiofrequency Tray; Standing - Fluoro (C-Arm) (<60 min) (No  Report); Standing - lidocaine (XYLOCAINE) 2 % (with pres) injection 400 mg - lactated ringers infusion 1,000 mL - midazolam (VERSED) 5 MG/5ML injection 1-2 mg - fentaNYL (SUBLIMAZE) injection 25-50 mcg - ropivacaine (PF) 2 mg/mL (0.2%) (NAROPIN) injection 9 mL - triamcinolone acetonide (KENALOG-40) injection 40 mg - Radiofrequency,Lumbar - Radiofrequency Tray - Fluoro (C-Arm) (<60 min) (No Report)  13. Osteoarthritis of facet joint of lumbar spine *** - RFA - Lumbar Facet (Today) - Consent: L-FCT (RFA) - Radiofrequency Tray; Standing - Fluoro (C-Arm) (<60 min) (No Report); Standing - lidocaine (XYLOCAINE) 2 % (with pres) injection 400 mg - lactated ringers infusion 1,000 mL - midazolam (VERSED) 5 MG/5ML injection 1-2 mg - fentaNYL (SUBLIMAZE) injection 25-50 mcg - ropivacaine (PF) 2 mg/mL (0.2%) (NAROPIN) injection 9 mL - triamcinolone acetonide (KENALOG-40) injection 40 mg - Radiofrequency Tray - Fluoro (C-Arm) (<60 min) (No Report)  14. Chronic bilateral low back pain with bilateral sciatica *** - RFA - Lumbar Facet (Today) - Consent: L-FCT (RFA) - Radiofrequency Tray; Standing - Fluoro (C-Arm) (<60 min) (No Report); Standing - lidocaine (XYLOCAINE) 2 % (with pres) injection 400 mg - lactated ringers infusion 1,000 mL - midazolam (VERSED) 5 MG/5ML injection 1-2 mg - fentaNYL (SUBLIMAZE) injection 25-50 mcg - ropivacaine (PF) 2 mg/mL (0.2%) (NAROPIN) injection 9 mL - triamcinolone acetonide (KENALOG-40) injection 40 mg - Radiofrequency Tray - Fluoro (C-Arm) (<60 min) (No Report)   TEST LINE 9: CODE:  No notes on file  TEST LINE 10: CODE:  Clinical Intake - 12/27/18 1348      Pain   Pain   0-10    Pain Type  Chronic pain    Pain Location  Back    Pain Orientation  Lower    Pain Radiating Towards  top of hamstring bilatera    Pain Descriptors / Indicators  Aching;Constant;Sore;Discomfort    Pain Onset  More than a month ago    Pain Frequency  Constant    Pain  Relieving Factors  medication, rest    Effect of Pain on Daily Activities  limits activities, prolonged walking and standing.      Nutrition Screen   BMI - recorded  32.98    Nutritional Status  BMI > 30  Obese    Nutritional Risks  None    Diabetes  Yes   95 this am   CBG done?  No    Did pt. bring in CBG monitor from home?  No      Functional Status   Activities of Daily Living  Independent    Ambulation  Independent with device- listed below    [employer]  Independent      Abuse/Neglect   Do you feel unsafe in your current relationship?  No    Do you feel physically threatened by others?  No    Anyone hurting you at home,  work, or school?  No    Unable to ask?  No       TEST LINE 11: CODE:  No notes on file  TEST LINE 12: CODE:  1. Lumbar facet syndrome (Bilateral) (R>L) *** - RFA - Lumbar Facet (Today) - Consent: L-FCT (RFA) - Radiofrequency Tray; Standing - Fluoro (C-Arm) (<60 min) (No Report); Standing - lidocaine (XYLOCAINE) 2 % (with pres) injection 400 mg - lactated ringers infusion 1,000 mL - midazolam (VERSED) 5 MG/5ML injection 1-2 mg - fentaNYL (SUBLIMAZE) injection 25-50 mcg - ropivacaine (PF) 2 mg/mL (0.2%) (NAROPIN) injection 9 mL - triamcinolone acetonide (KENALOG-40) injection 40 mg - Radiofrequency,Lumbar - Radiofrequency Tray - Fluoro (C-Arm) (<60 min) (No Report)  2. Spondylosis without myelopathy or radiculopathy, lumbosacral region *** - RFA - Lumbar Facet (Today) - Consent: L-FCT (RFA) - Radiofrequency Tray; Standing - Fluoro (C-Arm) (<60 min) (No Report); Standing - lidocaine (XYLOCAINE) 2 % (with pres) injection 400 mg - lactated ringers infusion 1,000 mL - midazolam (VERSED) 5 MG/5ML injection 1-2 mg - fentaNYL (SUBLIMAZE) injection 25-50 mcg - ropivacaine (PF) 2 mg/mL (0.2%) (NAROPIN) injection 9 mL - triamcinolone acetonide (KENALOG-40)  injection 40 mg - Radiofrequency Tray - Fluoro (C-Arm) (<60 min) (No Report)  3. Lumbar facet joint osteoarthritis (Bilateral) *** - RFA - Lumbar Facet (Today) - Consent: L-FCT (RFA) - Radiofrequency Tray; Standing - Fluoro (C-Arm) (<60 min) (No Report); Standing - lidocaine (XYLOCAINE) 2 % (with pres) injection 400 mg - lactated ringers infusion 1,000 mL - midazolam (VERSED) 5 MG/5ML injection 1-2 mg - fentaNYL (SUBLIMAZE) injection 25-50 mcg - ropivacaine (PF) 2 mg/mL (0.2%) (NAROPIN) injection 9 mL - triamcinolone acetonide (KENALOG-40) injection 40 mg - Radiofrequency Tray - Fluoro (C-Arm) (<60 min) (No Report)  4. DDD (degenerative disc disease), lumbar *** - RFA - Lumbar Facet (Today) - Consent: L-FCT (RFA) - Radiofrequency Tray; Standing - Fluoro (C-Arm) (<60 min) (No Report); Standing - lidocaine (XYLOCAINE) 2 % (with pres) injection 400 mg - lactated ringers infusion 1,000 mL - midazolam (VERSED) 5 MG/5ML injection 1-2 mg - fentaNYL (SUBLIMAZE) injection 25-50 mcg - ropivacaine (PF) 2 mg/mL (0.2%) (NAROPIN) injection 9 mL - triamcinolone acetonide (KENALOG-40) injection 40 mg - Radiofrequency Tray - Fluoro (C-Arm) (<60 min) (No Report)  5. Chronic low back pain (Primary Source of Pain) (Bilateral) (R>L) *** - RFA - Lumbar Facet (Today) - Consent: L-FCT (RFA) - Radiofrequency Tray; Standing - Fluoro (C-Arm) (<60 min) (No Report); Standing - lidocaine (XYLOCAINE) 2 % (with pres) injection 400 mg - lactated ringers infusion 1,000 mL - midazolam (VERSED) 5 MG/5ML injection 1-2 mg - fentaNYL (SUBLIMAZE) injection 25-50 mcg - ropivacaine (PF) 2 mg/mL (0.2%) (NAROPIN) injection 9 mL - triamcinolone acetonide (KENALOG-40) injection 40 mg - Radiofrequency Tray - Fluoro (C-Arm) (<60 min) (No Report)  6. Chronic pain syndrome *** - Oxycodone HCl 10 MG TABS; Take 1 tablet (10 mg total) by mouth every 6 (six) hours as needed. Must last 30 days  Dispense: 120 tablet;  Refill: 0  7. History of allergy to radiographic contrast media *** - Allergy: CONTRAST; Standing - Allergy: CONTRAST  8. History of Vasovagal response to spinal injections *** - glycopyrrolate (ROBINUL) injection 0.2 mg  9. History of allergy to shellfish *** - Allergy: CONTRAST; Standing - Allergy: IODINE / Shellfish; Standing - Allergy: CONTRAST - Allergy: IODINE / Shellfish  10. Allergy to povidone-iodine topical antiseptic *** - Allergy: IODINE / Shellfish; Standing - Allergy: IODINE / Shellfish  11. Acute postoperative pain *** -  HYDROcodone-acetaminophen (NORCO/VICODIN) 5-325 MG tablet; Take 1 tablet by mouth every 6 (six) hours as needed for up to 7 days for severe pain. Must last 7 days.  Dispense: 28 tablet; Refill: 0 - HYDROcodone-acetaminophen (NORCO/VICODIN) 5-325 MG tablet; Take 1 tablet by mouth every 6 (six) hours as needed for up to 7 days for severe pain. Must last 7 days.  Dispense: 28 tablet; Refill: 0  12. Facet syndrome, lumbar *** - RFA - Lumbar Facet (Today) - Consent: L-FCT (RFA) - Radiofrequency Tray; Standing - Fluoro (C-Arm) (<60 min) (No Report); Standing - lidocaine (XYLOCAINE) 2 % (with pres) injection 400 mg - lactated ringers infusion 1,000 mL - midazolam (VERSED) 5 MG/5ML injection 1-2 mg - fentaNYL (SUBLIMAZE) injection 25-50 mcg - ropivacaine (PF) 2 mg/mL (0.2%) (NAROPIN) injection 9 mL - triamcinolone acetonide (KENALOG-40) injection 40 mg - Radiofrequency,Lumbar - Radiofrequency Tray - Fluoro (C-Arm) (<60 min) (No Report)  13. Osteoarthritis of facet joint of lumbar spine *** - RFA - Lumbar Facet (Today) - Consent: L-FCT (RFA) - Radiofrequency Tray; Standing - Fluoro (C-Arm) (<60 min) (No Report); Standing - lidocaine (XYLOCAINE) 2 % (with pres) injection 400 mg - lactated ringers infusion 1,000 mL - midazolam (VERSED) 5 MG/5ML injection 1-2 mg - fentaNYL (SUBLIMAZE) injection 25-50 mcg - ropivacaine (PF) 2 mg/mL (0.2%)  (NAROPIN) injection 9 mL - triamcinolone acetonide (KENALOG-40) injection 40 mg - Radiofrequency Tray - Fluoro (C-Arm) (<60 min) (No Report)  14. Chronic bilateral low back pain with bilateral sciatica *** - RFA - Lumbar Facet (Today) - Consent: L-FCT (RFA) - Radiofrequency Tray; Standing - Fluoro (C-Arm) (<60 min) (No Report); Standing - lidocaine (XYLOCAINE) 2 % (with pres) injection 400 mg - lactated ringers infusion 1,000 mL - midazolam (VERSED) 5 MG/5ML injection 1-2 mg - fentaNYL (SUBLIMAZE) injection 25-50 mcg - ropivacaine (PF) 2 mg/mL (0.2%) (NAROPIN) injection 9 mL - triamcinolone acetonide (KENALOG-40) injection 40 mg - Radiofrequency Tray - Fluoro (C-Arm) (<60 min) (No Report)  Other orders - meloxicam (MOBIC) 15 MG tablet; Take 15 mg by mouth daily.    TEST LINE 13: CODE:  Problem List as of 12/27/2018 Reviewed: 12/01/2018 11:36 AM by Milinda Pointer, MD     High   Abnormal nerve conduction studies (severe bilateral lower extremity polyneuropathy)   Acute postoperative pain   Cervicogenic headache (Left)   Chronic cervical radicular pain (Left)   Chronic headache   Last Assessment & Plan 03/16/2017 Office Visit Written 03/16/2017  7:21 PM by [name], MD    He will continue to see neurology.      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  He did have a fall and was evaluated with imaging of his right hip.  No persistent deficits.  Doing well at this time.      Chronic lower extremity pain (Secondary source of pain) (Bilateral) (R>L)   Last Assessment & Plan 05/22/2015 Office Visit Written 05/22/2015  7:45 PM by [name], MD    Patient with chronic pain in his lower extremities followed by  pain management. We will refer to physical therapy to see if this will help with his pain and his subjective weakness. He is neurologically intact in his lower extremities.  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) (Right L5 dermatome)   Chronic musculoskeletal pain   Chronic neck pain (midline over the C7 spinous processes) (L>R)   Chronic pain of both knees   Chronic pain syndrome   Chronic shoulder pain (Bilateral) (R>L)   Chronic shoulder radicular pain (Left)   Chronic tension-type headache, intractable   Coccygodynia   DDD (degenerative disc disease), lumbar   Diabetic peripheral neuropathy [facility])   Last Assessment & Plan 09/07/2016 Office Visit Written 09/07/2016  9:15 AM by [name], MD    Normal foot exam today. Continue to monitor. Discussed foot precautions.      History of total hip replacement (Right)   Last Assessment & Plan 03/18/2015 Office Visit Written 03/19/2015  9:29 AM by [name], MD    Patient notes chronic right hip pain followed by pain management. Notes he is going for radiofrequency ablation in his back to help with this. Patient will continue treatment per pain management.      Lumbar facet joint osteoarthritis (Bilateral)   Lumbar facet syndrome (Bilateral) (R>L)   Last Assessment & Plan 03/06/2015 Clinical Support Written 03/06/2015  6:00 PM by Milinda Pointer, MD    This diagnosis was confirmed by way of at least 2 diagnostic, bilateral, lumbar facet blocks done under fluoroscopic guidance, at 2 different locations. The patient is pending radiofrequency ablation of the lumbar facets, starting with the right side and following with the left side 6 weeks later.      Lumbar spondylosis   Neurogenic pain   Neuropathic pain   Occipital neuralgia (Left)   Paraparesis (HCC)   Spondylosis without myelopathy or radiculopathy, lumbosacral region   Thumb pain (Right)     Medium   Allergy to povidone-iodine topical antiseptic    Disturbance of skin sensation   Encounter for therapeutic drug level monitoring   History of allergy to radiographic contrast media   History of allergy to shellfish   History of postoperative nausea and vomiting   History of Vasovagal response to spinal injections   Last Assessment & Plan 04/30/2015 Office Visit Written 04/30/2015 12:01 PM by Milinda Pointer, MD    This was promptly treated with atropine 1 mg IV with good response.      Long term current use of opiate analgesic   Long term prescription opiate use   Opiate dependence (HCC)   Opiate use (60 MME/Day)   Opioid-induced constipation (OIC)     Low   Allergic rhinitis   Last Assessment & Plan 09/07/2016 Office Visit Written 09/07/2016  9:14 AM by [name], MD    Try brand name Nasonex and Claritin.      Anxiety and depression   Last Assessment & Plan 01/17/2018 Office Visit Written 01/17/2018  6:02 PM by [name], MD    Asymptomatic.  Continue Lexapro and Xanax as needed.      BPH (benign prostatic hyperplasia)   Last Assessment & Plan 05/03/2018 Office Visit Written 05/03/2018  9:38 AM by [name], MD    Reports decent control.  I did discuss the next step would be having him see a urologist though given the current coronavirus issues we will hold off on that until a later date.      Chronic kidney disease (CKD)   Class I Morbid obesity (HCC) (68% higher incidence of chronic low back pain)   Contrast dye allergy   Diabetes [facility])   Last Assessment & Plan 09/09/2018 Office  Visit Written 09/09/2018  9:46 AM by [name], MD    Much improved control.  He will continue his current regimen.  We will have him in for lab work.      Encounter for general adult medical examination with abnormal findings   Last Assessment & Plan 09/27/2017 Office Visit Written 09/27/2017  6:04 PM by [name], MD    Physical exam completed.  Given dietary guidelines.  Encouraged exercise.  Prescription  for Pneumovax given.  Encouraged to get Shingrix at the pharmacy.  Lab work as outlined below.      Essential hypertension   Last Assessment & Plan 09/09/2018 Office Visit Written 09/09/2018  9:45 AM by [name], MD    He will come in for nurse BP check in about a month.  Continue current medication.  Check lab work.      GERD (gastroesophageal reflux disease)   Last Assessment & Plan 09/09/2018 Office Visit Written 09/09/2018  9:45 AM by [name], MD    Continue Protonix.  Discussed not laying down after eating.      Hyperlipidemia   Last Assessment & Plan 05/22/2015 Office Visit Written 05/22/2015  7:42 PM by [name], MD    Patient is likely tolerating medication well, though this is somewhat muddied by his chronic pain. We'll check a CK. Given mildly elevated liver function tests we'll check hepatitis serology as well. I discussed obtaining an ultrasound though he declined this. He will continue his pravastatin. We'll check a lipid panel as well.      Lesion of skin of foot   Last Assessment & Plan 01/17/2018 Office Visit Written 01/17/2018  6:03 PM by [name], MD    Referred to dermatology for evaluation.      Nevus   Last Assessment & Plan 12/10/2016 Office Visit Edited 12/10/2016  5:14 PM by [name], MD    Refer to dermatology.      Obstructive sleep apnea   Vertigo     Unprioritized   Right hip pain   Last Assessment & Plan 11/25/2018 Office Visit Written 11/26/2018  9:53 AM by [name], MD    Suspect musculoskeletal strain and soft tissue injury though we will obtain an x-ray to rule out underlying issues with his replacement.  We will switch him from ibuprofen to meloxicam.  Discussed taking this with food and if it upsets his stomach he needs to let us know.          TEST LINE 14 CODE:  No new Assessment & Plan notes have been filed under this hospital service since the last note was generated. Service: Pain  Management    TEST LINE 15 CODE:  @LASTPROBCOURSENOTES @   TEST LINE 16 CODE:  @POCPROBCOURSECARRYFWD @   TEST LINE 17: CODE:  No problem-specific Assessment & Plan notes found for this encounter.    TEST LINE 18: CODE:  @PROBCOURSENOTES @   TEST LINE 19: CODE:  Active Opioid Contract: with [facility]. Avoid prescribing benzodiazepines or any other CNS depressant. Do NOT DELETE any opioid prescriptions from Pain Management (Dr. Dossie Arbour) during Medication reconciliation.    TEST LINE 20: CODE:  No new subjective & objective note has been filed under this hospital service since the last note was generated.    TEST LINE 21: CODE:  Patient Active Problem List   Diagnosis Date Noted  . Spondylosis without myelopathy or radiculopathy, lumbosacral region 12/27/2018  Priority: High  . 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 Thumb pain (Right) 03/03/2018    Priority: High  . Chronic pain of both knees 05/18/2017    Priority: High  . Coccygodynia 04/19/2017    Priority: High  . DDD (degenerative disc disease), lumbar 04/19/2017    Priority: High  . Chronic musculoskeletal pain 04/19/2017    Priority: High  . Chronic headache 03/16/2017    Priority: High  . Chronic tension-type headache, intractable 02/24/2017    Priority: High  . Lumbar facet joint osteoarthritis (Bilateral) 02/16/2017    Priority: High  . Acute postoperative pain 11/24/2016    Priority: High  . Chronic pain syndrome 02/13/2016    Priority: High  . Chronic shoulder radicular pain (Left) 12/18/2015    Priority: High  . Chronic cervical radicular pain (Left) 12/18/2015    Priority: High  . Chronic shoulder pain (Bilateral) (R>L) 03/06/2015    Priority: High  . Occipital neuralgia (Left) 03/06/2015    Priority: High  . Cervicogenic headache (Left) 03/06/2015    Priority: High  . Chronic low back pain (Primary Source of Pain) (Bilateral)  (R>L) 12/05/2014    Priority: High  . Lumbar facet syndrome (Bilateral) (R>L) 12/05/2014    Priority: High    Confirmed by diagnostic bilateral lumbar facet block under fluoroscopic guidance.   . Lumbar spondylosis 12/05/2014    Priority: High  . Diabetic peripheral neuropathy ([place]) 12/05/2014    Priority: High  . Chronic neck pain (midline over the C7 spinous processes) (L>R) 12/05/2014    Priority: High  . Neurogenic pain 12/05/2014    Priority: High  . Neuropathic pain 12/05/2014    Priority: High  . Chronic lower extremity pain (Secondary source of pain) (Bilateral) (R>L) 12/05/2014    Priority: High    The pain is over the area of the hamstrings going down to the back of the knee through the posterior aspect of the leg, suggesting referred pain from the lumbar facets.   . 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) (Right L5 dermatome) 12/05/2014    Priority: High  . History of total hip replacement (Right) 12/05/2014    Priority: High  . Chronic hip pain (Right) 12/05/2014    Priority: High  . Abnormal nerve conduction studies (severe bilateral lower extremity polyneuropathy) 12/05/2014    Priority: High    Diagnosed by EMG/PNCV done on 04/25/2014 by Dr. Michaelle Copas [facility] neurology).   . Paraparesis ([place]) 05/02/2014    Priority: High  . History of allergy to shellfish 12/27/2018    Priority: Medium  . Allergy to povidone-iodine topical antiseptic 12/27/2018    Priority: Medium  . History of allergy to radiographic contrast media 02/16/2017    Priority: Medium  . History of postoperative nausea and vomiting 11/24/2016    Priority: Medium  . Opioid-induced constipation (OIC) 05/14/2016    Priority: Medium  . Disturbance of skin sensation 11/13/2015    Priority: Medium  . History of Vasovagal response to spinal injections 04/30/2015    Priority: Medium    Class: History of    In response to nerve block doing radiofrequency ablation and spinal  injections. Usually pretreated with 0.2 mg of glycopyrrolate (Robinul) IV   . Long term current use of opiate analgesic 12/05/2014    Priority: Medium  . Long term prescription opiate use 12/05/2014    Priority: Medium  . Opiate use (60 MME/Day) 12/05/2014  Priority: Medium    Oxycodone IR 10 mg every 6 hours (40 mg/day)   . Opiate dependence ([place]) 12/05/2014    Priority: Medium  . Encounter for therapeutic drug level monitoring 12/05/2014    Priority: Medium  . Lesion of skin of foot 01/17/2018    Priority: Low  . Vertigo 02/24/2017    Priority: Low  . Nevus 12/10/2016    Priority: Low  . Encounter for general adult medical examination with abnormal findings 09/25/2016    Priority: Low  . Anxiety and depression 07/17/2015    Priority: Low  . Allergic rhinitis 05/22/2015    Priority: Low  . BPH (benign prostatic hyperplasia) 04/17/2015    Priority: Low  . Diabetes ([place]) 03/19/2015    Priority: Low  . Contrast dye allergy 12/05/2014    Priority: Low  . Class I Morbid obesity ([place]) (68% higher incidence of chronic low back pain) 12/05/2014    Priority: Low  . Essential hypertension 12/05/2014    Priority: Low  . GERD (gastroesophageal reflux disease) 12/05/2014    Priority: Low  . Obstructive sleep apnea 12/05/2014    Priority: Low  . Hyperlipidemia 12/05/2014    Priority: Low  . Chronic kidney disease (CKD) 12/05/2014    Priority: Low  . Right hip pain 05/03/2018     TEST LINE 22: CODE:  Specialty Problems    None       TEST LINE 23: CODE:  No problem-specific Assessment & Plan notes found for this encounter.    TEST LINE 24: CODE:  Problem List as of 12/27/2018 Reviewed: 12/01/2018 11:36 AM by Milinda Pointer, MD     High   Abnormal nerve conduction studies (severe bilateral lower extremity polyneuropathy)   Acute postoperative pain   Cervicogenic headache (Left)   Chronic cervical radicular pain (Left)   Chronic headache   Last Assessment &  Plan 03/16/2017 Office Visit Written 03/16/2017  7:21 PM by [name], MD    He will continue to see neurology.      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  He did have a fall and was evaluated with imaging of his right hip.  No persistent deficits.  Doing well at this time.      Chronic lower extremity pain (Secondary source of pain) (Bilateral) (R>L)   Last Assessment & Plan 05/22/2015 Office Visit Written 05/22/2015  7:45 PM by [name], MD    Patient with chronic pain in his lower extremities followed by pain management. We will refer to physical therapy to see if this will help with his pain and his subjective weakness. He is neurologically intact in his lower extremities.      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) (Right L5 dermatome)   Chronic musculoskeletal pain   Chronic neck pain (midline over the C7 spinous processes) (L>R)   Chronic pain of both knees   Chronic pain syndrome   Chronic shoulder pain (Bilateral) (R>L)   Chronic shoulder radicular pain (Left)   Chronic tension-type headache, intractable   Coccygodynia   DDD (degenerative disc disease), lumbar   Diabetic peripheral neuropathy [facility])   Last Assessment & Plan 09/07/2016 Office Visit Written 09/07/2016  9:15 AM by  [name], MD    Normal foot exam today. Continue to monitor. Discussed foot precautions.      History of total hip replacement (Right)   Last Assessment & Plan 03/18/2015 Office Visit Written 03/19/2015  9:29 AM by [name], MD    Patient notes chronic right hip pain followed by pain management. Notes he is going for radiofrequency ablation in his back to help with this. Patient will continue treatment per pain  management.      Lumbar facet joint osteoarthritis (Bilateral)   Lumbar facet syndrome (Bilateral) (R>L)   Last Assessment & Plan 03/06/2015 Clinical Support Written 03/06/2015  6:00 PM by Milinda Pointer, MD    This diagnosis was confirmed by way of at least 2 diagnostic, bilateral, lumbar facet blocks done under fluoroscopic guidance, at 2 different locations. The patient is pending radiofrequency ablation of the lumbar facets, starting with the right side and following with the left side 6 weeks later.      Lumbar spondylosis   Neurogenic pain   Neuropathic pain   Occipital neuralgia (Left)   Paraparesis (HCC)   Spondylosis without myelopathy or radiculopathy, lumbosacral region   Thumb pain (Right)     Medium   Allergy to povidone-iodine topical antiseptic   Disturbance of skin sensation   Encounter for therapeutic drug level monitoring   History of allergy to radiographic contrast media   History of allergy to shellfish   History of postoperative nausea and vomiting   History of Vasovagal response to spinal injections   Last Assessment & Plan 04/30/2015 Office Visit Written 04/30/2015 12:01 PM by Milinda Pointer, MD    This was promptly treated with atropine 1 mg IV with good response.      Long term current use of opiate analgesic   Long term prescription opiate use   Opiate dependence (HCC)   Opiate use (60 MME/Day)   Opioid-induced constipation (OIC)     Low   Allergic rhinitis   Last Assessment & Plan 09/07/2016 Office Visit Written 09/07/2016  9:14 AM by [name], MD    Try brand name Nasonex and Claritin.      Anxiety and depression   Last Assessment & Plan 01/17/2018 Office Visit Written 01/17/2018  6:02 PM by [name], MD    Asymptomatic.  Continue Lexapro and Xanax as needed.      BPH (benign prostatic hyperplasia)   Last Assessment & Plan 05/03/2018 Office Visit Written 05/03/2018  9:38 AM by [name], MD    Reports decent  control.  I did discuss the next step would be having him see a urologist though given the current coronavirus issues we will hold off on that until a later date.      Chronic kidney disease (CKD)   Class I Morbid obesity (HCC) (68% higher incidence of chronic low back pain)   Contrast dye allergy   Diabetes [facility])   Last Assessment & Plan 09/09/2018 Office Visit Written 09/09/2018  9:46 AM by [name], MD    Much improved control.  He will continue his current regimen.  We will have him in for lab work.      Encounter for general adult medical examination with abnormal findings   Last Assessment & Plan 09/27/2017 Office Visit Written 09/27/2017  6:04 PM by [name], MD    Physical exam completed.  Given dietary guidelines.  Encouraged exercise.  Prescription for Pneumovax given.  Encouraged to get  Shingrix at the pharmacy.  Lab work as outlined below.      Essential hypertension   Last Assessment & Plan 09/09/2018 Office Visit Written 09/09/2018  9:45 AM by [name], MD    He will come in for nurse BP check in about a month.  Continue current medication.  Check lab work.      GERD (gastroesophageal reflux disease)   Last Assessment & Plan 09/09/2018 Office Visit Written 09/09/2018  9:45 AM by [name], MD    Continue Protonix.  Discussed not laying down after eating.      Hyperlipidemia   Last Assessment & Plan 05/22/2015 Office Visit Written 05/22/2015  7:42 PM by [name], MD    Patient is likely tolerating medication well, though this is somewhat muddied by his chronic pain. We'll check a CK. Given mildly elevated liver function tests we'll check hepatitis serology as well. I discussed obtaining an ultrasound though he declined this. He will continue his pravastatin. We'll check a lipid panel as well.      Lesion of skin of foot   Last Assessment & Plan 01/17/2018 Office Visit Written 01/17/2018  6:03 PM by [name], MD    Referred to  dermatology for evaluation.      Nevus   Last Assessment & Plan 12/10/2016 Office Visit Edited 12/10/2016  5:14 PM by [name], MD    Refer to dermatology.      Obstructive sleep apnea   Vertigo     Unprioritized   Right hip pain   Last Assessment & Plan 11/25/2018 Office Visit Written 11/26/2018  9:53 AM by [name], MD    Suspect musculoskeletal strain and soft tissue injury though we will obtain an x-ray to rule out underlying issues with his replacement.  We will switch him from ibuprofen to meloxicam.  Discussed taking this with food and if it upsets his stomach he needs to let us know.          TEST LINE 25: CODE:  Patient Active Problem List   Diagnosis Date Noted  . Spondylosis without myelopathy or radiculopathy, lumbosacral region 12/27/2018    Priority: High  . 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 Thumb pain (Right) 03/03/2018    Priority: High  . Chronic pain of both knees 05/18/2017    Priority: High  . Coccygodynia 04/19/2017    Priority: High  . DDD (degenerative disc disease), lumbar 04/19/2017    Priority: High  . Chronic musculoskeletal pain 04/19/2017    Priority: High  . Chronic headache 03/16/2017    Priority: High  . Chronic tension-type headache, intractable 02/24/2017    Priority: High  . Lumbar facet joint osteoarthritis (Bilateral) 02/16/2017    Priority: High  . Acute postoperative pain 11/24/2016    Priority: High  . Chronic pain syndrome 02/13/2016    Priority: High  . Chronic shoulder radicular pain (Left) 12/18/2015    Priority: High  . Chronic cervical radicular pain (Left) 12/18/2015    Priority: High  . Chronic shoulder pain (Bilateral) (R>L) 03/06/2015    Priority: High  . Occipital neuralgia (Left) 03/06/2015    Priority: High  . Cervicogenic headache (Left) 03/06/2015    Priority: High  . Chronic low back pain (Primary Source of Pain) (Bilateral)  (R>L) 12/05/2014    Priority: High  . Lumbar facet syndrome (Bilateral) (R>L) 12/05/2014  Priority: High    Confirmed by diagnostic bilateral lumbar facet block under fluoroscopic guidance.   . Lumbar spondylosis 12/05/2014    Priority: High  . Diabetic peripheral neuropathy ([place]) 12/05/2014    Priority: High  . Chronic neck pain (midline over the C7 spinous processes) (L>R) 12/05/2014    Priority: High  . Neurogenic pain 12/05/2014    Priority: High  . Neuropathic pain 12/05/2014    Priority: High  . Chronic lower extremity pain (Secondary source of pain) (Bilateral) (R>L) 12/05/2014    Priority: High    The pain is over the area of the hamstrings going down to the back of the knee through the posterior aspect of the leg, suggesting referred pain from the lumbar facets.   . 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) (Right L5 dermatome) 12/05/2014    Priority: High  . History of total hip replacement (Right) 12/05/2014    Priority: High  . Chronic hip pain (Right) 12/05/2014    Priority: High  . Abnormal nerve conduction studies (severe bilateral lower extremity polyneuropathy) 12/05/2014    Priority: High    Diagnosed by EMG/PNCV done on 04/25/2014 by Dr. Michaelle Copas [facility] neurology).   . Paraparesis ([place]) 05/02/2014    Priority: High  . History of allergy to shellfish 12/27/2018    Priority: Medium  . Allergy to povidone-iodine topical antiseptic 12/27/2018    Priority: Medium  . History of allergy to radiographic contrast media 02/16/2017    Priority: Medium  . History of postoperative nausea and vomiting 11/24/2016    Priority: Medium  . Opioid-induced constipation (OIC) 05/14/2016    Priority: Medium  . Disturbance of skin sensation 11/13/2015    Priority: Medium  . History of Vasovagal response to spinal injections 04/30/2015    Priority: Medium    Class: History of    In response to nerve block doing radiofrequency ablation and spinal  injections. Usually pretreated with 0.2 mg of glycopyrrolate (Robinul) IV   . Long term current use of opiate analgesic 12/05/2014    Priority: Medium  . Long term prescription opiate use 12/05/2014    Priority: Medium  . Opiate use (60 MME/Day) 12/05/2014    Priority: Medium    Oxycodone IR 10 mg every 6 hours (40 mg/day)   . Opiate dependence ([place]) 12/05/2014    Priority: Medium  . Encounter for therapeutic drug level monitoring 12/05/2014    Priority: Medium  . Lesion of skin of foot 01/17/2018    Priority: Low  . Vertigo 02/24/2017    Priority: Low  . Nevus 12/10/2016    Priority: Low  . Encounter for general adult medical examination with abnormal findings 09/25/2016    Priority: Low  . Anxiety and depression 07/17/2015    Priority: Low  . Allergic rhinitis 05/22/2015    Priority: Low  . BPH (benign prostatic hyperplasia) 04/17/2015    Priority: Low  . Diabetes ([place]) 03/19/2015    Priority: Low  . Contrast dye allergy 12/05/2014    Priority: Low  . Class I Morbid obesity ([place]) (68% higher incidence of chronic low back pain) 12/05/2014    Priority: Low  . Essential hypertension 12/05/2014    Priority: Low  . GERD (gastroesophageal reflux disease) 12/05/2014    Priority: Low  . Obstructive sleep apnea 12/05/2014    Priority: Low  . Hyperlipidemia 12/05/2014    Priority: Low  . Chronic kidney disease (CKD) 12/05/2014    Priority: Low  . Right hip pain  05/03/2018     TEST LINE 26: CODE:  No problems updated.   TEST LINE 27: CODE:    TEST LINE 28: CODE:    TEST LINE 29: CODE:    TEST LINE 31: CODE:    TEST LINE 32: CODE:    TEST LINE 33: CODE:    TEST LINE 34: CODE:     TEST LINE 35: CODE:

## 2018-12-28 NOTE — Telephone Encounter (Signed)
Post procedure phone call.  LM 

## 2019-01-02 ENCOUNTER — Telehealth: Payer: Self-pay | Admitting: *Deleted

## 2019-01-02 NOTE — Telephone Encounter (Signed)
Copied from [place] [phone]. Topic: General - Inquiry >> Jan 02, 2019 11:41 AM Alease Frame wrote: Reason for CRM: Patient needs prescription for ingrown hair . Not sure of medication please call patient back.

## 2019-01-03 NOTE — Telephone Encounter (Signed)
Left message on voicemail for patient to return a call back to discuss ingrown hair.  Tully Burgo,cma

## 2019-01-04 NOTE — Telephone Encounter (Signed)
I received a call back from the patient and he states he has had this before and you prescribed something he just can't remember the name of it. The place is on his right leg, it bursted yesterday, it is not warm to the touch but it is draining blood and pus. He has no fever himself and he has been applying an antibiotic ointment but it is not healing.  He would like for you to call that same medication in for him.  Alan Schmidt,cma

## 2019-01-04 NOTE — Telephone Encounter (Signed)
He needs to complete a visit for evaluation of this.  Please offer him a virtual visit or he can go to urgent care if there are no visits available.

## 2019-01-09 ENCOUNTER — Other Ambulatory Visit: Payer: Self-pay | Admitting: Family Medicine

## 2019-01-10 DIAGNOSIS — E118 Type 2 diabetes mellitus with unspecified complications: Secondary | ICD-10-CM | POA: Diagnosis not present

## 2019-01-10 DIAGNOSIS — Z794 Long term (current) use of insulin: Secondary | ICD-10-CM | POA: Diagnosis not present

## 2019-01-12 ENCOUNTER — Telehealth: Payer: Self-pay

## 2019-01-12 ENCOUNTER — Ambulatory Visit (INDEPENDENT_AMBULATORY_CARE_PROVIDER_SITE_OTHER): Payer: Medicare Other | Admitting: Pharmacist

## 2019-01-12 DIAGNOSIS — F419 Anxiety disorder, unspecified: Secondary | ICD-10-CM

## 2019-01-12 DIAGNOSIS — M5441 Lumbago with sciatica, right side: Secondary | ICD-10-CM

## 2019-01-12 DIAGNOSIS — E1142 Type 2 diabetes mellitus with diabetic polyneuropathy: Secondary | ICD-10-CM | POA: Diagnosis not present

## 2019-01-12 DIAGNOSIS — G8929 Other chronic pain: Secondary | ICD-10-CM

## 2019-01-12 DIAGNOSIS — Z794 Long term (current) use of insulin: Secondary | ICD-10-CM

## 2019-01-12 DIAGNOSIS — F329 Major depressive disorder, single episode, unspecified: Secondary | ICD-10-CM

## 2019-01-12 NOTE — Chronic Care Management (AMB) (Signed)
Chronic Care Management   Follow Up Note   01/12/2019 Name: Alan Schmidt MRN: [id number] DOB: 09-02-1945  Referred by: [name], MD Reason for referral : Chronic Care Management (Medication Management)   Alan Schmidt is a 73 y.o. year old male who is a primary care patient of [name], [name], MD. The CCM team was consulted for assistance with chronic disease management and care coordination needs.    Contacted patient for medication management review today.   Review of patient status, including review of consultants reports, relevant laboratory and other test results, and collaboration with appropriate care team members and the patient's provider was performed as part of comprehensive patient evaluation and provision of chronic care management services.    SDOH (Social Determinants of Health) screening performed today: Depression   Stress. See Care Plan for related entries.   Outpatient Encounter Medications as of 01/12/2019  Medication Sig Note  . ALPRAZolam (XANAX) 1 MG tablet TAKE 0.5 TABLETS (0.5 MG TOTAL) BY MOUTH 2 (TWO) TIMES DAILY AS NEEDED FOR ANXIETY. 01/12/2019: Taking 1/2 tab QAM, occasionally 1/2 for sleep   . amLODipine (NORVASC) 5 MG tablet TAKE 1 TABLET BY MOUTH EVERY DAY   . aspirin EC 81 MG tablet Take 81 mg by mouth daily.   . BD PEN NEEDLE NANO U/F 32G X 4 MM MISC USE EVERY DAY   . carvedilol (COREG) 25 MG tablet TAKE 1 TABLET BY MOUTH TWICE A DAY   . Continuous Blood Gluc Receiver (FREESTYLE LIBRE 14 DAY READER) DEVI 1 Device by Does not apply route daily. Use to scan to check blood sugar up to 7 times daily; E11.42,   . Continuous Blood Gluc Sensor (FREESTYLE LIBRE 14 DAY SENSOR) MISC 1 Device by Does not apply route every 14 (fourteen) days. E11.42   . escitalopram (LEXAPRO) 10 MG tablet Take 1 tablet (10 mg total) by mouth daily.   . hydrochlorothiazide (HYDRODIURIL) 25 MG tablet TAKE 1 TABLET BY MOUTH EVERY DAY   . insulin lispro (HUMALOG  KWIKPEN) 100 UNIT/ML KwikPen INJECT 0.12-0.18 MLS (12-18 UNITS TOTAL) INTO THE SKIN 2 (TWO) TIMES DAILY WITH A MEAL. 11/17/2018: 10 units  . JARDIANCE 25 MG TABS tablet TAKE 1 TABLET (25 MG) BY MOUTH DAILY.   Marland Kitchen loratadine (CLARITIN) 10 MG tablet Take 1 tablet (10 mg total) by mouth daily.   Marland Kitchen lubiprostone (AMITIZA) 8 MCG capsule Take 1 capsule (8 mcg total) by mouth 2 (two) times daily with a meal. Swallow the medication whole. Do not break or chew the medication. 01/12/2019: Taking PRN constipation  . meloxicam (MOBIC) 7.5 MG tablet TAKE 1 TABLET BY MOUTH EVERY DAY   . metFORMIN (GLUCOPHAGE) 1000 MG tablet TAKE 1 TABLET (1,000 MG TOTAL) BY MOUTH 2 (TWO) TIMES DAILY WITH A MEAL.   Marland Kitchen nortriptyline (PAMELOR) 10 MG capsule Take 20 mg by mouth at bedtime.    Marland Kitchen olmesartan (BENICAR) 40 MG tablet TAKE 1 TABLET BY MOUTH EVERY DAY   . Oxycodone HCl 10 MG TABS Take 1 tablet (10 mg total) by mouth every 6 (six) hours as needed. Must last 30 days   . pantoprazole (PROTONIX) 40 MG tablet TAKE 1 TABLET BY MOUTH EVERY DAY   . pravastatin (PRAVACHOL) 40 MG tablet TAKE 1 TABLET BY MOUTH DAILY   . sennosides-docusate sodium (SENOKOT-S) 8.6-50 MG tablet Take 1 tablet by mouth daily.   . tamsulosin (FLOMAX) 0.4 MG CAPS capsule TAKE 1 CAPSULE (0.4 MG TOTAL) BY MOUTH 2 (TWO)  TIMES DAILY.   Marland Kitchen TRESIBA FLEXTOUCH 100 UNIT/ML SOPN FlexTouch Pen INJECT 40 UNITS INTO THE SKIN DAILY AT 10 PM. 01/12/2019: 46 units   . VICTOZA 18 MG/3ML SOPN INJECT 0.3 MLS (1.8 MG TOTAL) INTO THE SKIN DAILY.   Marland Kitchen Wheat Dextrin (BENEFIBER) POWD Take 6 g by mouth 3 (three) times daily before meals. (2 tsp = 6 g)   . acetaminophen (TYLENOL) 500 MG tablet Take 500 mg by mouth every 6 (six) hours as needed. 05/16/2018: PRN  . cyclobenzaprine (FLEXERIL) 10 MG tablet Take 1 tablet (10 mg total) by mouth 3 (three) times daily as needed for muscle spasms. Must last 30 days. (Patient not taking: Reported on 01/12/2019)   . desoximetasone (TOPICORT) 0.25 %  cream APPLY CREAM TO AFFECTED AREA TWO TIMES DAILY, FOR UP TO 7 DAYS, DO NOT APPLY TO FACE   . hydrOXYzine (ATARAX/VISTARIL) 50 MG tablet TAKE 1 TABLET BY MOUTH FOUR TIMES A DAY AS NEEDED (Patient not taking: Reported on 01/12/2019)   . magnesium 30 MG tablet Take 1 tablet (30 mg total) by mouth 2 (two) times daily. (Patient not taking: Reported on 01/12/2019)   . mometasone (NASONEX) 50 MCG/ACT nasal spray PLACE 2 SPRAYS INTO THE NOSE DAILY. (Patient not taking: Reported on 01/12/2019)   . naloxone [facility]) 2 MG/2ML injection Inject content of syringe into thigh muscle. Call 911. (Patient not taking: Reported on 01/12/2019)   . Oxycodone HCl 10 MG TABS Take 1 tablet (10 mg total) by mouth every 6 (six) hours as needed. Must last 30 days   . [START ON 01/30/2019] Oxycodone HCl 10 MG TABS Take 1 tablet (10 mg total) by mouth every 6 (six) hours as needed. Must last 30 days   . [DISCONTINUED] meloxicam (MOBIC) 15 MG tablet Take 15 mg by mouth daily.    No facility-administered encounter medications on file as of 01/12/2019.     Goals Addressed            This Visit's Progress     Patient Stated   . "I want to keep an eye on my diabetes" (pt-stated)       Current Barriers:  . Diabetes: uncontrolled though improved per CGM results; most recent A1c 8.5% . Current antihyperglycemic regimen: metformin 1000 mg BID, Jardiance 25 mg daily, Victoza 1.8 mg daily, Tresiba 64 units daily, Humalog 10 with meals 3x daily, though occasionally will use 5 units if a smaller meal; has been correcting w/ extra 5 units occasionally  . Current blood glucose readings: per CGM download of the past 14 days  o Target range 70-180: 74% of the time; high 181-250: 25% of the time; very high >250 1% of the time o Glucose Management Indicator: 7.0% o Average glucose: 155 . Cardiovascular risk reduction: o Current hypertensive regimen: amlodipine 5 mg daily, carvedilol 25 mg BID, HCTZ 25 mg daily, olmesartan 40 mg  daily; not checking BP at home as he does not have a working meter;  o Current hyperlipidemia regimen: pravastatin 40 mg daily; LDL well controlled last check <70 . Mental health concerns: patient reports today that he "struggles" more over the winter months. His mother passed away the day after Thanksgiving. Currently taking escitalopram 10 mg daily, alprazolam 0.5 mg 1-2 times daily, hydroxyzine 50 mg up to 4 times daily as needed  . Chronic pain; notes significant pain relief from RFA ~3 weeks ago by Dr. Consuela Mimes. Oxycodone 10 mg 6H PRN, nortriptyline 10 mg daily (prescribed 20, but has  only been taking 10 mg to stretch supply because he needs a refill); meloxicam 7.5 mg daily (does not feel this is beneficial for his pain), and cyclobenzaprine 10 mg PRN (has not been taking, notes this causes dry mouth, dry eyes).  o Taking senna PRN and fiber for constipation  Pharmacist Clinical Goal(s):  Marland Kitchen Over the next 90 days, patient with work with PharmD and primary care provider to address optimized diabetes management  Interventions: . Comprehensive medication review; medication list updated in electronic medication . Reviewed CGM results. Reviewed that patient needs to inject mealtime insulin before eating and meal, and avoid taking second dose to correct to reduce risk of post meal hypoglycemia. Increase Humalog to 12 units with lunch/supper, as bigger meals, and continue 10 units with breakfast.  . Continue Tyler Aas 64 units daily, Victoza 1.8 mg daily, Jardiance 25 mg daily. . Throughout the call, patient noted many anxieties regarding his health and his worries about contracting COVID. Discussed risks respiratory depression with  concomitant benzodiazepine and opioid use, and need to minimize alprazolam use. Discussed that escitalopram could be increased to 20 mg daily. Will discuss w/ Dr. [name]. Also discussed referral to LCSW team member for mental health support and connection w/ community mental  health resources- patient is amenable to this.  . Recommended he contact Dr. Patricia Nettle office for refill on nortriptyline. Recommend he try cyclobenzaprine 5 mg (1/2 tab) for muscle spasms to see if less anticholinergic side effects; if this does not help, recommended he discuss w/ Dr. Consuela Mimes, as well as discuss that current meloxicam 7.5 mg dosing does not seem to provide benefit. Patient has f/u scheduled in January.   Patient Self Care Activities:  . Patient will check blood glucose with CGM, document, and provide at future appointment . Patient will take medications as prescribed . Patient will report any questions or concerns to provider   Please see past updates related to this goal by clicking on the "Past Updates" button in the selected goal          Plan: - Will place referral for LCSW support - Scheduled outreach with patient in ~4-5 weeks  [name], PharmD, [place], [place] Pharmacist [place] [phone]

## 2019-01-12 NOTE — Telephone Encounter (Signed)
He left a vm asking for Dr. Dossie Arbour to call out the nortriptyline 10mg  2 at bedtime. Marland Kitchen He is out. Also he has cut back on cyclobenzaprine due to it is drying him out terribly.

## 2019-01-12 NOTE — Patient Instructions (Signed)
Visit Information  Goals Addressed            This Visit's Progress     Patient Stated   . "I want to keep an eye on my diabetes" (pt-stated)       Current Barriers:  . Diabetes: uncontrolled though improved per CGM results; most recent A1c 8.5% . Current antihyperglycemic regimen: metformin 1000 mg BID, Jardiance 25 mg daily, Victoza 1.8 mg daily, Tresiba 64 units daily, Humalog 10 with meals 3x daily, though occasionally will use 5 units if a smaller meal; has been correcting w/ extra 5 units occasionally  . Current blood glucose readings: per CGM download of the past 14 days  o Target range 70-180: 74% of the time; high 181-250: 25% of the time; very high >250 1% of the time o Glucose Management Indicator: 7.0% o Average glucose: 155 . Cardiovascular risk reduction: o Current hypertensive regimen: amlodipine 5 mg daily, carvedilol 25 mg BID, HCTZ 25 mg daily, olmesartan 40 mg daily; not checking BP at home as he does not have a working meter;  o Current hyperlipidemia regimen: pravastatin 40 mg daily; LDL well controlled last check <70 . Mental health concerns: patient reports today that he "struggles" more over the winter months. His mother passed away the day after Thanksgiving. Currently taking escitalopram 10 mg daily, alprazolam 0.5 mg 1-2 times daily, hydroxyzine 50 mg up to 4 times daily as needed  . Chronic pain; notes significant pain relief from RFA ~3 weeks ago by Dr. Consuela Mimes. Oxycodone 10 mg 6H PRN, nortriptyline 10 mg daily (prescribed 20, but has only been taking 10 mg to stretch supply because he needs a refill); meloxicam 7.5 mg daily (does not feel this is beneficial for his pain), and cyclobenzaprine 10 mg PRN (has not been taking, notes this causes dry mouth, dry eyes).  o Taking senna PRN and fiber for constipation  Pharmacist Clinical Goal(s):  Marland Kitchen Over the next 90 days, patient with work with PharmD and primary care provider to address optimized diabetes  management  Interventions: . Comprehensive medication review; medication list updated in electronic medication . Reviewed CGM results. Reviewed that patient needs to inject mealtime insulin before eating and meal, and avoid taking second dose to correct to reduce risk of post meal hypoglycemia. Increase Humalog to 12 units with lunch/supper, as bigger meals, and continue 10 units with breakfast.  . Continue Tyler Aas 64 units daily, Victoza 1.8 mg daily, Jardiance 25 mg daily. . Throughout the call, patient noted many anxieties regarding his health and his worries about contracting COVID. Discussed risks respiratory depression with  concomitant benzodiazepine and opioid use, and need to minimize alprazolam use. Discussed that escitalopram could be increased to 20 mg daily. Will discuss w/ Dr. [name]. Also discussed referral to LCSW team member for mental health support and connection w/ community mental health resources- patient is amenable to this.  . Recommended he contact Dr. Patricia Nettle office for refill on nortriptyline. Recommend he try cyclobenzaprine 5 mg (1/2 tab) for muscle spasms to see if less anticholinergic side effects; if this does not help, recommended he discuss w/ Dr. Consuela Mimes, as well as discuss that current meloxicam 7.5 mg dosing does not seem to provide benefit. Patient has f/u scheduled in January.   Patient Self Care Activities:  . Patient will check blood glucose with CGM, document, and provide at future appointment . Patient will take medications as prescribed . Patient will report any questions or concerns to provider   Please  see past updates related to this goal by clicking on the "Past Updates" button in the selected goal         The patient verbalized understanding of instructions provided today and declined a print copy of patient instruction materials.   Plan: - Will place referral for LCSW support - Scheduled outreach with patient in ~4-5 weeks  [name]  Darnelle Maffucci, PharmD, [place], [place] Pharmacist [place] [phone]

## 2019-01-12 NOTE — Telephone Encounter (Signed)
Dr. Dossie Arbour has never prescribed this medication. Patient called and no answer. LVM as to this.

## 2019-01-17 MED ORDER — ESCITALOPRAM OXALATE 20 MG PO TABS: 20.0000 mg | ORAL_TABLET | Freq: Every day | ORAL | 1 refills | Status: DC

## 2019-01-17 NOTE — Progress Notes (Signed)
Lexapro sent to pharmacy. 

## 2019-01-17 NOTE — Addendum Note (Signed)
Addended by: [name] on: 01/17/2019 04:54 PM   Modules accepted: Orders

## 2019-01-18 NOTE — Telephone Encounter (Signed)
Patient was informed to be evaluated.  Alan Schmidt,cma

## 2019-01-19 DIAGNOSIS — Z794 Long term (current) use of insulin: Secondary | ICD-10-CM | POA: Diagnosis not present

## 2019-01-19 DIAGNOSIS — L851 Acquired keratosis [keratoderma] palmaris et plantaris: Secondary | ICD-10-CM | POA: Diagnosis not present

## 2019-01-19 DIAGNOSIS — B351 Tinea unguium: Secondary | ICD-10-CM | POA: Diagnosis not present

## 2019-01-19 DIAGNOSIS — E1140 Type 2 diabetes mellitus with diabetic neuropathy, unspecified: Secondary | ICD-10-CM | POA: Diagnosis not present

## 2019-01-20 ENCOUNTER — Encounter: Payer: Self-pay | Admitting: *Deleted

## 2019-01-20 ENCOUNTER — Other Ambulatory Visit: Payer: Self-pay | Admitting: *Deleted

## 2019-01-20 DIAGNOSIS — H35033 Hypertensive retinopathy, bilateral: Secondary | ICD-10-CM | POA: Diagnosis not present

## 2019-01-20 DIAGNOSIS — E113493 Type 2 diabetes mellitus with severe nonproliferative diabetic retinopathy without macular edema, bilateral: Secondary | ICD-10-CM | POA: Diagnosis not present

## 2019-01-20 DIAGNOSIS — H43821 Vitreomacular adhesion, right eye: Secondary | ICD-10-CM | POA: Diagnosis not present

## 2019-01-20 DIAGNOSIS — H35373 Puckering of macula, bilateral: Secondary | ICD-10-CM | POA: Diagnosis not present

## 2019-01-20 NOTE — Patient Outreach (Signed)
[place] [facility]) Care Management [facility] CM Telephone Outreach, Routine referral  01/20/2019  JIHAAD HAGG December 05, 1945 [id number]  Unsuccessful telephone outreach to [name], 73 y/o male referred to [place] by [place] Pharmacist embedded in patient's PCP office for possible community resource needs in dealing with chronic depression.  Patient has had no recent hospitalizations.  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  With call attempts today, was unable to leave patient voice mail message requesting call back on his number listed as home number- phone range without physical or voice mail pick up.  With attempt to patient's number listed as mobile number, left HIPAA compliant voice message, requesting call back and explaining that I would re-attempt call to him next week.  Left my contact information on voice mail message, in case patient would like to contact me prior to my next outreach attempt  Plan:  Will place [facility] CM unsuccessful patient outreach letter in mail requesting call back in writing  Will re-attempt [place] telephone outreach within 4 business days if I do not hear back from patient first  Oneta Rack, RN, BSN, [employer] [facility] Care Management  [phone]

## 2019-01-23 ENCOUNTER — Ambulatory Visit: Payer: Medicare Other | Admitting: *Deleted

## 2019-01-26 ENCOUNTER — Encounter: Payer: Self-pay | Admitting: *Deleted

## 2019-01-26 ENCOUNTER — Other Ambulatory Visit: Payer: Self-pay | Admitting: *Deleted

## 2019-01-26 NOTE — Patient Outreach (Signed)
[place] [facility]) Care Management [place] Telephone Outreach, Routine MD referral  01/26/2019  [name] 1945/10/27 [id number]  Unsuccessful second telephone outreach attempt to Alan Schmidt, 73 y/o male referred to [place] by [place] Pharmacist embedded in patient's PCP office for possible community resource needs in dealing with chronic depression.  Patient has had no recent hospitalizations.  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  Patient had left me a voice message this week providing his best contact number as [phone] and this number was attempted today  HIPAA compliant voice mail message left for patient, requesting return call back.  Plan:  Verified [place] unsuccessful patient outreach letter placed in mail requesting call back in writing on 01/20/2019  Will re-attempt [place] telephone outreach within 4 business days if I do not hear back from patient first.   Oneta Rack, RN, BSN, [place] Coordinator [facility] Care Management  [phone]

## 2019-01-31 ENCOUNTER — Other Ambulatory Visit: Payer: Self-pay | Admitting: *Deleted

## 2019-01-31 ENCOUNTER — Encounter: Payer: Self-pay | Admitting: *Deleted

## 2019-01-31 NOTE — Patient Outreach (Signed)
[facility] Schmidt [place] [facility]) Care Management [facility] CM Telephone Outreach, Routine PCP referral  01/31/2019  Alan Schmidt May 12, 1945 [id number]  Successful telephone outreach attempt to Alan Schmidt, 73 y/o male referred to Alan Schmidt Pharmacist embedded in patient's PCP officefor possible community resource needs in dealing with chronic depression. 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  HIPAA/ identity verified; [facility] CM services discussed with patient and patient provides verbal consent for Alan Schmidt CM involvement in his care.  Today, patient reports doing "fairly well;" reports chronic pain in (R) lower extremity/ hip, which radiates into his leg, at "7/10;" reports medications "help a little bit," but adds that "nothing" completely resolves pain and he lives with ongoing pain; followed by pain clinic.  Reports approximately 2 falls over last year without serious injury; uses cane for ambulation.  Patient sounds to be in no distress throughout phone call today.  Patient further reports:  Medications: -- Has all medicationsand takes as prescribed;denies questions around current medications.  -- Verbalizes good general understanding of the purpose, dosing, and scheduling of medications; continues working with [facility] Schmidt Pharmacist embedded at PCP practice for optimization of medications    -- self-manage medications using weekly pill planner box. -- denies issues with swallowing medications -- reports "not sure" if recently prescribed meloxicam "is helping" with pain-- encouraged patient to discuss with Alan Schmidt Pharmacist/ pain clinic provider around ongoing options for pain medication; patient verbalizes agreement  Provider appointments: -- All upcoming provider appointments were reviewed  with patient today; patient verbalizes plans to attend all; states that he has had "some disappointments" around interactions with pain clinic provider; not sure if he wants to continue going to these appointments; I encouraged patient to continue working with pain clinic provider and also encouraged him to discuss his stated disappointments with pain management with the provider directly, so that his plan of care can be clarified  Safety/ Mobility/ Falls: -- denies new/ recent falls, but as noted above reports approximately 2 falls without injury over course of last year; states falls were due to ongoing (R) lower extremity pain -- assistive devices: uses cane -- general fall risks/ prevention education discussed with patient today  Holiday representative needs: -- currently describes community resource needs for options around resources for depression; depression screening completed and patient reports that he "deals" with depression and he denies SI; patient working with [facility] Pharmacist on optimizing SSRI dosing of antidepressants -- patient formerly a Data processing manager and has worked with [employer] Schmidt in the past; states that he has won 2 [employer] for his musical compositions; moved to [place] to live with his sister, who has recently passed away; still works regularly with his music ventures -- patient continues to drive self to appointments -- describes himself as his own caregiver; described limited local support systems -- SDOH completed for depression/ transportation: patient could benefit from overall assessment/ review of community resource needs:  Explained to patient that Alan Schmidt team has been referred to reach out to him to discuss community resources available to him, and encouraged him to engage with Alan Schmidt team once outreach is established  Scientist, physiological (AD) Planning:   --reports does not currently have exisisting AD in place, and would like information on creating  same: basics of advanced Directive planning were discussed with  patient.  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;" discussed simple ways to begin decreasing carbohydrates and sugar into dietary routines; patient will consider -- reports fasting blood sugar this morning of "136" -- discussed with patient his most recent A1-C of 8.5 (October 2020); states he would like to decrease his A1-C but has trouble "knowing where and how to start;" discussed that I place educational material in mail to him around Diabetes diet/ meal planning for further discussion at subsequent [facility] RN CM outreach, encouraged him to begin making small dietary changes and incorporate gradually into eating habits/ routine -- encouraged patient to pay close attention to the foods he does eat, noting which foods tend to drive his home blood sugar readings up/ high- patient agrees to do so  Patient denies further issues, concerns, or problems today.  I provided confirmed patient has my direct phone number, the main [place] Schmidt CM office phone number, and the [facility] CM 24-hour nurse advice phone number should issues arise prior to next scheduled [place] outreach.  Encouraged patient to contact me directly if needs, questions, issues, or concerns arise prior to next scheduled outreach; patient agreed to do so.  Plan:  Patient will take medications as prescribed and will attend all scheduled provider appointments  Patient will promptly notify care providers for any new concerns/ issues/ problems that  arise  Patient will continue monitoring/ recording blood sugars at home using [name] CGM system  Patient will engage with [facility] CSW and will maintain engagement with [facility] Pharmacist embedded at his PCP office  I will mail patient printed educational material around Advanced Directive planning and dietary strategies for self-health management of chronic disease state of DM, and patient will review  I will make patient's PCP aware of [facility] RN CM involvement in patient's care-- will send barriers letter  [place] outreach to continue with scheduled phone call next month post-upcoming provider appointments  [facility] CM Care Plan Problem One     Most Recent Value  Care Plan Problem One  Self-health management of chronic disease state of Diabetes in patient with depression as evidenced by patient reporting  Role Documenting the Problem One  Care Management Coordinator  Care Plan for Problem One  Active  [facility] Long Term Goal   Over the next 60 days, patient will verbalize 3 dietary strategies for blood sugar management as evidenced by patient reporting during [facility] RN CM outreach  [facility] Long Term Goal Start Date  01/31/19  Interventions for Problem One Long Term Goal  Discussed with patient his current management of DM,  initiated [facility] RN CM program for diabetes management and started [facility] CM initial assessment,  placed printed educational material in mail to patient around self-health dietary management strategies for DM and encouraged patient to promptly review  [facility] CM Short Term Goal #1   Over the next 30 days, patient will continue monitoring and recording blood sugars at home as evdienced by patient reporting and review of same during [facility] RN CM outreach  [facility] CM Short Term Goal #1 Start Date  01/31/19  Interventions for  Short Term Goal #1  Discussed with patient his current routines for monitoring blood sugars and encouraged him to continue monitoring at home and to notice which foods he  currently eats that tend to drive his blood sugar readings up  [facility] CM Short Term Goal #2   Over the next 21 days, patient will discuss his depression and community resource needs with [facility] CSW, as evidenced by patient reporting and collaboration with [facility] CSW as indicated during [place] outreach  [facility] CM Short Term Goal #2 Start Date  01/31/19  Interventions for Short Term Goal #2  SDOH completed for depression,  shared with patient that [facility] CSW referral has been placed and encouraged him to fully engage with [place] [facility] [place] CSW once outreach is established,  placed care coordination message vis secure messaging through EMR to update [facility] CSW on today's successful outreach/ engagement of patient with [place] [facility] CM services     I appreciate the opportunity to participate in HQ's care,  Oneta Rack, RN, BSN, [employer] Coordinator [facility] Care Management  [phone]

## 2019-02-02 ENCOUNTER — Other Ambulatory Visit: Payer: Self-pay | Admitting: Family Medicine

## 2019-02-05 NOTE — Progress Notes (Signed)
Virtual Encounter - Pain Management PROVIDER NOTE: Information contained herein reflects review and annotations entered in association with encounter. Interpretation of such information and data should be left to medically-trained personnel. Information provided to patient can be located elsewhere in the medical record under "Patient Instructions". Document created using STT-dictation technology, any transcriptional errors that may result from process are unintentional.    Contact & Pharmacy Preferred: [phone] Home: [phone] (home) Mobile: [phone] (mobile) E-mail: [email or web address]  [id number] Lorina Rabon, [place] [address] [place] [address] Phone: [phone] Fax: [phone]  [id number] - [place], [place] - [address] AT [facility] [address] [place] [address] Phone: [phone] Fax: [phone]   Pre-screening  Mr. Grandville Silos offered "in-person" vs "virtual" encounter. He indicated preferring virtual for this encounter.   Reason COVID-19*  Social distancing based on CDC and AMA recommendations.   I contacted [name] on 02/07/2019 via telephone.      I clearly identified myself as Gaspar Cola, MD. I verified that I was speaking with the correct person using two identifiers (Name: LARNIE STANDFORD, and date of birth: Nov 08, 1945).  Consent I sought verbal advanced consent from [name] for virtual visit interactions. I informed Mr. Silber of possible security and privacy concerns, risks, and limitations associated with providing "not-in-person" medical evaluation and management services. I also informed Mr. Narasimhan of the availability of "in-person" appointments. Finally, I informed him that there would be a charge for the virtual visit and that he could be  personally, fully or partially, financially responsible for it. Mr. Riedemann expressed  understanding and agreed to proceed.   Historic Elements   Mr. NEALE STAVROS is a 74 y.o. year old, male patient evaluated today after his last encounter by our practice on 01/12/2019. Mr. Abney  has a past medical history of Acute postoperative pain (11/24/2016), Anxiety, Chronic hip pain (Right) (12/05/2014), Chronic lumbar pain, Depression, Hyperlipidemia, Hypertension, Kidney stones, and Migraines. He also  has a past surgical history that includes Total hip arthroplasty; Tonsillectomy; and right hip surgery. Mr. Basurto has a current medication list which includes the following prescription(s): acetaminophen, alprazolam, amlodipine, aspirin ec, bd pen needle nano u/f, carvedilol, freestyle libre 14 day reader, freestyle libre 14 day sensor, desoximetasone, escitalopram, hydrochlorothiazide, insulin lispro, jardiance, loratadine, lubiprostone, meloxicam, metformin, mometasone, nortriptyline, olmesartan, pantoprazole, pravastatin, sennosides-docusate sodium, tamsulosin, tresiba flextouch, victoza, benefiber, naloxone, [START ON 03/01/2019] oxycodone hcl, [START ON 03/31/2019] oxycodone hcl, and [START ON 04/30/2019] oxycodone hcl. He  reports that he has quit smoking. He has never used smokeless tobacco. He reports that he does not drink alcohol or use drugs. Mr. Buonocore is allergic to contrast media [iodinated diagnostic agents]; iodine; and shellfish allergy.   HPI  Today, he is being contacted for both, medication management and a post-procedure assessment. Dr. [name] has taken him off the ibuprofen and changed to mobic 7.5 mg and he feels this is not working.  Today I had an extremely long conversation with this patient about his medications on the results of the recent radiofrequency ablation.  I asked him if he was having pain on the left side and whether or not we needed to do radiofrequency of the lumbar facets on the left, but he indicated that he is doing so much better now that he does  not feel that he needs to do that.  Before I called the patient I called Dr. [name] and  we had a conversation regarding the switch from the ibuprofen and the Mobic and today I took the time to explain to the patient the logic behind it, avoiding the side effects of the medication, how much medicine he had to take to get comfortable, and the safety profile on the medicines and we have decided to stick with the Mobic.  However, the Mobic 7.5 mg is not doing the trick and therefore I will be increasing it to 15 mg/day.  I instructed the patient to take it in the mornings with some food.  The patient stopped using the Flexeril, primarily due to the fact that it was causing for him to have a dry mouth.  We decided not to substituted with anything else.  When I asked him the reason why he was taking it he indicated that he was having quite a bit of tightness in the area of the hamstring muscle and that he has been trying to treat that with the medication.  Instead, today I offer him the option to essentially go and do some physical therapy where they can work on stretching exercises that he can continue doing for the rest of his life.  He has been having some problems going up flights of stairs due to weakness and I believe that he would benefit significantly from going into her regular exercise program.  Today I have referred him to physical therapy so that they can provide him with adequate instructions on how to stretch in a safe manner.  We also looked at his medication use regarding the antidepressants and I noticed that he is taking 2 different types of antidepressants and I encouraged him to talk to the prescribing physicians to see if this can be simplified so as to avoid the possibility of a serotonin syndrome.  I decided to talk to him about this due to the fact that he has problems with anxiety and it may be that some of this may be chemically induced.  He has also been having problems sleeping at  night and therefore I spent quite some time with him going over appropriate sleep hygiene and the things that he would need to avoid in order to sleep better without the need of any chemicals.  He was very appreciative of the 55+ minutes that I spent with him going over all these topics and answering all of his questions.  Post-Procedure Evaluation  Procedure (12/27/2018): Therapeutic/palliative right lumbar facet RFA #5 under fluoroscopic guidance and IV sedation. Pre-procedure pain level:  0/10 Post-procedure: 0/10 (100% relief)  Sedation: Sedation provided.  Janett Billow, RN  02/07/2019  8:49 AM  Sign when Signing Visit Pain relief after procedure (treated area only): (Questions asked to patient) 1. Starting about 15 minutes after the procedure, and "while the area was still numb" (from the local anesthetics), were you having any of your usual pain "in that area" (the treated area)?  (NOTE: NOT including the discomfort from the needle sticks.) First 1 hour: 100 % better. First 4-6 hours: 100 % better. 2. How long did the numbness from the local anesthetics last? (More than 6 hours?) Duration: 2 weeks hours.  3. How much better is your pain now, when compared to before the procedure? Current benefit: 75 % better. 4. Can you move better now? Improvement in ROM (Range of Motion): ROM was better, however the muscles are beginning to get tight again and the pain is beginning to creep back, hamstring is very tight. 5. Can  you do more now? Improvement in function: Yes. 4. Did you have any problems with the procedure? Side-effects/Complications: No.  Current benefits: Defined as benefit that persist at this time.   Analgesia:  >75% relief Function: Mr. Mcirvin reports improvement in function ROM: Mr. Schmal reports improvement in ROM  Pharmacotherapy Assessment  Analgesic: Oxycodone IR 10 mg every 6 hours (40 mg/day) MME/day:60 mg/day.   Monitoring: Pharmacotherapy: No  side-effects or adverse reactions reported. [place] PMP: PDMP reviewed during this encounter.       Compliance: No problems identified. Effectiveness: Clinically acceptable. Plan: Refer to "POC".  UDS:  Summary  Date Value Ref Range Status  03/03/2018 FINAL  Final    Comment:    ==================================================================== TOXASSURE SELECT 13 (MW) ==================================================================== Test                             Result       Flag       Units Drug Present and Declared for Prescription Verification   Alprazolam                     53           EXPECTED   ng/mg creat   Alpha-hydroxyalprazolam        40           EXPECTED   ng/mg creat    Source of alprazolam is a scheduled prescription medication.    Alpha-hydroxyalprazolam is an expected metabolite of alprazolam.   Oxycodone                      1682         EXPECTED   ng/mg creat   Oxymorphone                    262          EXPECTED   ng/mg creat   Noroxycodone                   1203         EXPECTED   ng/mg creat    Sources of oxycodone include scheduled prescription medications.    Oxymorphone and noroxycodone are expected metabolites of    oxycodone. Oxymorphone is also available as a scheduled    prescription medication. Drug Present not Declared for Prescription Verification   Alcohol, Ethyl                 0.052        UNEXPECTED g/dL    Sources of ethyl alcohol include alcoholic beverages or as a    fermentation product of glucose; glucose is present in this    specimen.  Interpret result with caution, as the presence of    ethyl alcohol is likely due, at least in part, to fermentation of    glucose. ==================================================================== Test                      Result    Flag   Units      Ref Range   Creatinine              60               mg/dL      >=20 ==================================================================== Declared  Medications:  The flagging and interpretation on this report are based on the  following  declared medications.  Unexpected results may arise from  inaccuracies in the declared medications.  **Note: The testing scope of this panel includes these medications:  Alprazolam  Oxycodone  **Note: The testing scope of this panel does not include following  reported medications:  Acetaminophen  Amlodipine Besylate  Aspirin (Aspirin 81)  Carvedilol  Cyclobenzaprine  Desoximetasone TOPICAL  Empagliflozin  Escitalopram  Hydrochlorothiazide  Hydroxyzine  Insulin (Humalog)  Insulin Tyler Aas)  Liraglutide  Loratadine  Magnesium  Metformin  Mometasone  Naloxone  Olmesartan  Pantoprazole  Pravastatin  Tamsulosin ==================================================================== For clinical consultation, please call [phone]. ====================================================================    Laboratory Chemistry Profile (12 mo)  Renal: 10/26/2018: BUN 12; Creatinine, Ser 1.04  Lab Results  Component Value Date   GFR 84.78 10/26/2018   GFRAA >60 03/26/2018   GFRNONAA >60 03/26/2018   Hepatic: 10/26/2018: Albumin 4.0 Lab Results  Component Value Date   AST 23 10/26/2018   ALT 25 10/26/2018   Other: No results found for requested labs within last 8760 hours. Note: Above Lab results reviewed.  Imaging  Fluoro (C-Arm) (<60 min) (No Report) Fluoro was used, but no Radiologist interpretation will be provided.  Please refer to "NOTES" tab for provider progress note.   Assessment  Diagnoses of Chronic pain syndrome, Osteoarthritis involving multiple joints, and Shortened hamstring muscle were pertinent to this visit.  Plan of Care  Problem-specific:  No problem-specific Assessment & Plan notes found for this encounter.  I have discontinued Karmen Stabs. Symmonds's hydrOXYzine, magnesium, and cyclobenzaprine. I have also changed his meloxicam. Additionally, I am having him  start on Oxycodone HCl and Oxycodone HCl. Lastly, I am having him maintain his naloxone, BD Pen Needle Nano U/F, acetaminophen, aspirin EC, amLODipine, Victoza, loratadine, FreeStyle Libre 14 Day Reader, FreeStyle Libre 14 Day Sensor, desoximetasone, metFORMIN, nortriptyline, insulin lispro, tamsulosin, olmesartan, pravastatin, carvedilol, pantoprazole, hydrochlorothiazide, lubiprostone, Benefiber, Tresiba FlexTouch, ALPRAZolam, sennosides-docusate sodium, escitalopram, mometasone, Jardiance, and Oxycodone HCl.  Pharmacotherapy (Medications Ordered): Meds ordered this encounter  Medications  . Oxycodone HCl 10 MG TABS    Sig: Take 1 tablet (10 mg total) by mouth every 6 (six) hours as needed. Must last 30 days    Dispense:  120 tablet    Refill:  0    Chronic Pain: STOP Act (Not applicable) Fill 1 day early if closed on refill date. Do not fill until: 03/01/2019. To last until: 03/31/2019. Avoid benzodiazepines within 8 hours of opioids  . Oxycodone HCl 10 MG TABS    Sig: Take 1 tablet (10 mg total) by mouth every 6 (six) hours as needed. Must last 30 days    Dispense:  120 tablet    Refill:  0    Chronic Pain: STOP Act (Not applicable) Fill 1 day early if closed on refill date. Do not fill until: 03/31/2019. To last until: 04/30/2019. Avoid benzodiazepines within 8 hours of opioids  . Oxycodone HCl 10 MG TABS    Sig: Take 1 tablet (10 mg total) by mouth every 6 (six) hours as needed. Must last 30 days    Dispense:  120 tablet    Refill:  0    Chronic Pain: STOP Act (Not applicable) Fill 1 day early if closed on refill date. Do not fill until: 04/30/2019. To last until: 05/30/2019. Avoid benzodiazepines within 8 hours of opioids  . meloxicam (MOBIC) 15 MG tablet    Sig: Take 1 tablet (15 mg total) by mouth daily.    Dispense:  90 tablet  Refill:  1    Fill one day early if pharmacy is closed on scheduled refill date. May substitute for generic if available.   Orders:  Orders Placed This  Encounter  Procedures  . AMB PT Referral (2-3x/wk, x 6wks)    Referral Priority:   Routine    Referral Type:   Physical Medicine    Referral Reason:   Specialty Services Required    Requested Specialty:   Physical Therapy    Number of Visits Requested:   1   Follow-up plan:   Return in about 4 months (around 05/29/2019) for (VV), (MM).      Interventional management options: Planned, scheduled, and/or pending:   Therapeutic caudal epidural steroid injection under fluoroscopic guidance and IV sedation   Considering:   Diagnostic Left GONB  Possible Left GON RFA    Palliative PRN treatment(s):   Palliative right lumbar facet block #3  Palliative left lumbar facet block #3  Therapeutic left cervical ESI #2  Palliative left lumbar facet RFA #2 (last done on 02/16/2017) Palliative right lumbar facet RFA #6 (last done on 12/27/2018)    Recent Visits Date Type Provider Dept  12/27/18 Procedure visit Milinda Pointer, MD [facility]  12/01/18 Office Visit Milinda Pointer, MD [facility]  Showing recent visits within past 90 days and meeting all other requirements   Today's Visits Date Type Provider Dept  02/07/19 Telemedicine Milinda Pointer, MD [facility]  Showing today's visits and meeting all other requirements   Future Appointments Date Type Provider Dept  02/27/19 Appointment Milinda Pointer, MD [facility]  Showing future appointments within next 90 days and meeting all other requirements   I discussed the assessment and treatment plan with the patient. The patient was provided an opportunity to ask questions and all were answered. The patient agreed with the plan and demonstrated an understanding of the instructions.  Patient advised to call back or seek an in-person evaluation if the symptoms or condition worsens.  Total duration of non-face-to-face encounter: 55 minutes.  Note by: Gaspar Cola, MD Date:  02/07/2019; Time: 3:05 PM

## 2019-02-06 ENCOUNTER — Encounter: Payer: Self-pay | Admitting: Pain Medicine

## 2019-02-07 ENCOUNTER — Telehealth: Payer: Self-pay

## 2019-02-07 ENCOUNTER — Encounter: Payer: Self-pay | Admitting: Pain Medicine

## 2019-02-07 ENCOUNTER — Ambulatory Visit: Payer: Medicare Other | Attending: Pain Medicine | Admitting: Pain Medicine

## 2019-02-07 ENCOUNTER — Other Ambulatory Visit: Payer: Self-pay

## 2019-02-07 DIAGNOSIS — G894 Chronic pain syndrome: Secondary | ICD-10-CM

## 2019-02-07 DIAGNOSIS — M8949 Other hypertrophic osteoarthropathy, multiple sites: Secondary | ICD-10-CM

## 2019-02-07 DIAGNOSIS — M159 Polyosteoarthritis, unspecified: Secondary | ICD-10-CM

## 2019-02-07 DIAGNOSIS — M6248 Contracture of muscle, other site: Secondary | ICD-10-CM | POA: Diagnosis not present

## 2019-02-07 MED ORDER — OXYCODONE HCL 10 MG PO TABS: 10.0000 mg | ORAL_TABLET | Freq: Four times a day (QID) | ORAL | 0 refills | Status: DC | PRN

## 2019-02-07 MED ORDER — MELOXICAM 15 MG PO TABS: 15.0000 mg | ORAL_TABLET | Freq: Every day | ORAL | 1 refills | Status: DC

## 2019-02-07 NOTE — Progress Notes (Signed)
Pain relief after procedure (treated area only): (Questions asked to patient) 1. Starting about 15 minutes after the procedure, and "while the area was still numb" (from the local anesthetics), were you having any of your usual pain "in that area" (the treated area)?  (NOTE: NOT including the discomfort from the needle sticks.) First 1 hour: 100 % better. First 4-6 hours: 100 % better. 2. How long did the numbness from the local anesthetics last? (More than 6 hours?) Duration: 2 weeks hours.  3. How much better is your pain now, when compared to before the procedure? Current benefit: 75 % better. 4. Can you move better now? Improvement in ROM (Range of Motion): ROM was better, however the muscles are beginning to get tight again and the pain is beginning to creep back, hamstring is very tight. 5. Can you do more now? Improvement in function: Yes. 4. Did you have any problems with the procedure? Side-effects/Complications: No.

## 2019-02-07 NOTE — Telephone Encounter (Signed)
I called Dr. Dossie Arbour back.  I discussed that the patient was previously on ibuprofen though we transitioned him to meloxicam as the ibuprofen had not been quite as effective.  I advised from my perspective that we could go back to the ibuprofen if needed or continue with meloxicam. Dr Dossie Arbour noted he would prefer that the patient be on meloxicam over ibuprofen.  We made a joint decision to increase the dose of the patient's meloxicam and that Dr Dossie Arbour would take this prescription over.  Patient's renal function would tolerate an increased dose of meloxicam.

## 2019-02-10 DIAGNOSIS — Z794 Long term (current) use of insulin: Secondary | ICD-10-CM | POA: Diagnosis not present

## 2019-02-10 DIAGNOSIS — E118 Type 2 diabetes mellitus with unspecified complications: Secondary | ICD-10-CM | POA: Diagnosis not present

## 2019-02-13 ENCOUNTER — Ambulatory Visit (INDEPENDENT_AMBULATORY_CARE_PROVIDER_SITE_OTHER): Payer: Medicare Other | Admitting: Pharmacist

## 2019-02-13 DIAGNOSIS — F419 Anxiety disorder, unspecified: Secondary | ICD-10-CM | POA: Diagnosis not present

## 2019-02-13 DIAGNOSIS — Z794 Long term (current) use of insulin: Secondary | ICD-10-CM | POA: Diagnosis not present

## 2019-02-13 DIAGNOSIS — E1142 Type 2 diabetes mellitus with diabetic polyneuropathy: Secondary | ICD-10-CM | POA: Diagnosis not present

## 2019-02-13 DIAGNOSIS — F32A Depression, unspecified: Secondary | ICD-10-CM

## 2019-02-13 DIAGNOSIS — F329 Major depressive disorder, single episode, unspecified: Secondary | ICD-10-CM | POA: Diagnosis not present

## 2019-02-13 NOTE — Patient Instructions (Signed)
Visit Information  Goals Addressed            This Visit's Progress     Patient Stated   . "I want to keep an eye on my diabetes" (pt-stated)       Current Barriers:  . Diabetes: uncontrolled though improved per CGM results; most recent A1c 8.5% . Current antihyperglycemic regimen: metformin 1000 mg BID, Jardiance 25 mg daily, Victoza 1.8 mg daily, Tresiba 64 units daily, Humalog 12 with meals 3x daily, though occasionally using 8-10 units if smaller meals  . Current blood glucose readings: see above;  o Average glucose 143 o Glucose management indicator: 6.7% o Time above goal: 11%; time within goal 89% . Cardiovascular risk reduction: o Current hypertensive regimen: amlodipine 5 mg daily, carvedilol 25 mg BID, HCTZ 25 mg daily, olmesartan 40 mg daily;  o Current hyperlipidemia regimen: pravastatin 40 mg daily; LDL well controlled last check <70 . Mental health concerns: escitalopram 20 mg daily, alprazolam 0.5 mg PRN;  o Notes currently worried lately about his adoptive son, Al, that he hasn't heard from in a few weeks; last week, he got extremely concerned and spent a lot of time trying to get in touch with him. Notes that Al called him back a few days ago, and it was a huge stress relief for him o Notes his wife passed away ~ a year ago o Concerned about the state of affairs of the country right now. . Chronic pain; Last visit w/ Dr. Consuela Mimes 02/07/19, they spoke for almost an hour. Oxycodone 10 mg 6H PRN, meloxicam just increased to 15 mg daily o Notes after discussing duplicative antidepressants w/ Dr. Consuela Mimes, he tapered and stopped nortriptyline. Notes no change in symptoms of pain since stopping this  o Taking senna PRN and fiber for constipation  Pharmacist Clinical Goal(s):  Marland Kitchen Over the next 90 days, patient with work with PharmD and primary care provider to address optimized diabetes management  Interventions: . Comprehensive medication review; medication list updated in  electronic medication . Reviewed recent Pain Management appointment. He said he did some research about which PT facility he would like to go to. Encouraged him to call Dr. Patricia Nettle office with this information.  . Reviewed CGM results. Reviewed higher post prandial elevations. Increase Humalog to 14 units, 12 units if smaller meal. Continue Tresiba 46 units daily, Victoza 1.8 mg daily, Jardiance 25 mg daily.  . Encouraged to document on CGM when he takes a dose of insulin or eats a meal, to allow better correlation of actions and medication doses.   Patient Self Care Activities:  . Patient will check blood glucose with CGM, document, and provide at future appointment . Patient will take medications as prescribed . Patient will report any questions or concerns to provider   Please see past updates related to this goal by clicking on the "Past Updates" button in the selected goal         The patient verbalized understanding of instructions provided today and declined a print copy of patient instruction materials.    Plan: - Scheduled f/u call 03/27/19 @ 3 pm  [name], PharmD, [place], CPP Clinical Pharmacist [facility] [phone]

## 2019-02-13 NOTE — Chronic Care Management (AMB) (Signed)
Chronic Care Management   Follow Up Note   02/13/2019 Name: Alan Schmidt MRN: [id number] DOB: May 29, 1945  Referred by: [name], MD Reason for referral : Chronic Care Management (Medication Management)   Alan Schmidt is a 74 y.o. year old male who is a primary care patient of [name], [name], MD. The CCM team was consulted for assistance with chronic disease management and care coordination needs.    Contacted patient for medication management review today.   Review of patient status, including review of consultants reports, relevant laboratory and other test results, and collaboration with appropriate care team members and the patient's provider was performed as part of comprehensive patient evaluation and provision of chronic care management services.                SDOH (Social Determinants of Health) screening performed today: Depression   Stress. See Care Plan for related entries.   Outpatient Encounter Medications as of 02/13/2019  Medication Sig Note  . Continuous Blood Gluc Receiver (FREESTYLE LIBRE 14 DAY READER) DEVI 1 Device by Does not apply route daily. Use to scan to check blood sugar up to 7 times daily; E11.42,   . Continuous Blood Gluc Sensor (FREESTYLE LIBRE 14 DAY SENSOR) MISC 1 Device by Does not apply route every 14 (fourteen) days. E11.42   . escitalopram (LEXAPRO) 20 MG tablet Take 1 tablet (20 mg total) by mouth daily.   . insulin lispro (HUMALOG KWIKPEN) 100 UNIT/ML KwikPen INJECT 0.12-0.18 MLS (12-18 UNITS TOTAL) INTO THE SKIN 2 (TWO) TIMES DAILY WITH A MEAL. 02/13/2019: 12 units   . JARDIANCE 25 MG TABS tablet TAKE 1 TABLET BY MOUTH EVERY DAY   . meloxicam (MOBIC) 15 MG tablet Take 1 tablet (15 mg total) by mouth daily.   . TRESIBA FLEXTOUCH 100 UNIT/ML SOPN FlexTouch Pen INJECT 40 UNITS INTO THE SKIN DAILY AT 10 PM. 01/12/2019: 46 units   . acetaminophen (TYLENOL) 500 MG tablet Take 500 mg by mouth every 6 (six) hours as needed.  05/16/2018: PRN  . ALPRAZolam (XANAX) 1 MG tablet TAKE 0.5 TABLETS (0.5 MG TOTAL) BY MOUTH 2 (TWO) TIMES DAILY AS NEEDED FOR ANXIETY. 01/12/2019: Taking 1/2 tab QAM, occasionally 1/2 for sleep   . amLODipine (NORVASC) 5 MG tablet TAKE 1 TABLET BY MOUTH EVERY DAY   . aspirin EC 81 MG tablet Take 81 mg by mouth daily.   . BD PEN NEEDLE NANO U/F 32G X 4 MM MISC USE EVERY DAY   . carvedilol (COREG) 25 MG tablet TAKE 1 TABLET BY MOUTH TWICE A DAY   . desoximetasone (TOPICORT) 0.25 % cream APPLY CREAM TO AFFECTED AREA TWO TIMES DAILY, FOR UP TO 7 DAYS, DO NOT APPLY TO FACE   . hydrochlorothiazide (HYDRODIURIL) 25 MG tablet TAKE 1 TABLET BY MOUTH EVERY DAY   . loratadine (CLARITIN) 10 MG tablet Take 1 tablet (10 mg total) by mouth daily.   Marland Kitchen lubiprostone (AMITIZA) 8 MCG capsule Take 1 capsule (8 mcg total) by mouth 2 (two) times daily with a meal. Swallow the medication whole. Do not break or chew the medication. 01/12/2019: Taking PRN constipation  . metFORMIN (GLUCOPHAGE) 1000 MG tablet TAKE 1 TABLET (1,000 MG TOTAL) BY MOUTH 2 (TWO) TIMES DAILY WITH A MEAL.   . mometasone (NASONEX) 50 MCG/ACT nasal spray INSTILL TWO SPRAYS INTO THE NOSE DAILY   . naloxone [facility]) 2 MG/2ML injection Inject content of syringe into thigh muscle. Call 911. 02/07/2019: >>>DO NOT DELETE!!!<<<  This is a PRN medication for Emergencies only. Does not qualify as "TAKING" or "NOT TAKING".   Marland Kitchen olmesartan (BENICAR) 40 MG tablet TAKE 1 TABLET BY MOUTH EVERY DAY   . [START ON 03/01/2019] Oxycodone HCl 10 MG TABS Take 1 tablet (10 mg total) by mouth every 6 (six) hours as needed. Must last 30 days   . [START ON 03/31/2019] Oxycodone HCl 10 MG TABS Take 1 tablet (10 mg total) by mouth every 6 (six) hours as needed. Must last 30 days   . [START ON 04/30/2019] Oxycodone HCl 10 MG TABS Take 1 tablet (10 mg total) by mouth every 6 (six) hours as needed. Must last 30 days   . pantoprazole (PROTONIX) 40 MG tablet TAKE 1 TABLET BY MOUTH EVERY DAY    . pravastatin (PRAVACHOL) 40 MG tablet TAKE 1 TABLET BY MOUTH DAILY   . sennosides-docusate sodium (SENOKOT-S) 8.6-50 MG tablet Take 1 tablet by mouth daily.   . tamsulosin (FLOMAX) 0.4 MG CAPS capsule TAKE 1 CAPSULE (0.4 MG TOTAL) BY MOUTH 2 (TWO) TIMES DAILY.   Marland Kitchen VICTOZA 18 MG/3ML SOPN INJECT 0.3 MLS (1.8 MG TOTAL) INTO THE SKIN DAILY.   Marland Kitchen Wheat Dextrin (BENEFIBER) POWD Take 6 g by mouth 3 (three) times daily before meals. (2 tsp = 6 g)   . [DISCONTINUED] nortriptyline (PAMELOR) 10 MG capsule Take 20 mg by mouth at bedtime.     No facility-administered encounter medications on file as of 02/13/2019.     Goals Addressed            This Visit's Progress     Patient Stated   . "I want to keep an eye on my diabetes" (pt-stated)       Current Barriers:  . Diabetes: uncontrolled though improved per CGM results; most recent A1c 8.5% . Current antihyperglycemic regimen: metformin 1000 mg BID, Jardiance 25 mg daily, Victoza 1.8 mg daily, Tresiba 64 units daily, Humalog 12 with meals 3x daily, though occasionally using 8-10 units if smaller meals  . Current blood glucose readings: see above;  o Average glucose 143 o Glucose management indicator: 6.7% o Time above goal: 11%; time within goal 89% . Cardiovascular risk reduction: o Current hypertensive regimen: amlodipine 5 mg daily, carvedilol 25 mg BID, HCTZ 25 mg daily, olmesartan 40 mg daily;  o Current hyperlipidemia regimen: pravastatin 40 mg daily; LDL well controlled last check <70 . Mental health concerns: escitalopram 20 mg daily, alprazolam 0.5 mg PRN;  o Notes currently worried lately about his adoptive son, Al, that he hasn't heard from in a few weeks; last week, he got extremely concerned and spent a lot of time trying to get in touch with him. Notes that Al called him back a few days ago, and it was a huge stress relief for him o Notes his wife passed away ~ a year ago o Concerned about the state of affairs of the country right  now. . Chronic pain; Last visit w/ Dr. Consuela Mimes 02/07/19, they spoke for almost an hour. Oxycodone 10 mg 6H PRN, meloxicam just increased to 15 mg daily o Notes after discussing duplicative antidepressants w/ Dr. Consuela Mimes, he tapered and stopped nortriptyline. Notes no change in symptoms of pain since stopping this  o Taking senna PRN and fiber for constipation  Pharmacist Clinical Goal(s):  Marland Kitchen Over the next 90 days, patient with work with PharmD and primary care provider to address optimized diabetes management  Interventions: . Comprehensive medication review; medication list updated  in electronic medication . Reviewed recent Pain Management appointment. He said he did some research about which PT facility he would like to go to. Encouraged him to call Dr. Patricia Nettle office with this information.  . Reviewed CGM results. Reviewed higher post prandial elevations. Increase Humalog to 14 units, 12 units if smaller meal. Continue Tresiba 46 units daily, Victoza 1.8 mg daily, Jardiance 25 mg daily.  . Encouraged to document on CGM when he takes a dose of insulin or eats a meal, to allow better correlation of actions and medication doses.   Patient Self Care Activities:  . Patient will check blood glucose with CGM, document, and provide at future appointment . Patient will take medications as prescribed . Patient will report any questions or concerns to provider   Please see past updates related to this goal by clicking on the "Past Updates" button in the selected goal          Plan: - Scheduled f/u call 03/27/19 @ 3 pm  [name], PharmD, [place], CPP Clinical Pharmacist [employer] [phone]

## 2019-02-17 ENCOUNTER — Other Ambulatory Visit: Payer: Self-pay | Admitting: Family Medicine

## 2019-02-20 ENCOUNTER — Other Ambulatory Visit: Payer: Self-pay | Admitting: Family Medicine

## 2019-02-21 ENCOUNTER — Ambulatory Visit: Payer: Self-pay | Admitting: Pharmacist

## 2019-02-21 ENCOUNTER — Other Ambulatory Visit: Payer: Self-pay | Admitting: Lab

## 2019-02-21 ENCOUNTER — Other Ambulatory Visit: Payer: Self-pay | Admitting: *Deleted

## 2019-02-21 ENCOUNTER — Encounter: Payer: Self-pay | Admitting: *Deleted

## 2019-02-21 ENCOUNTER — Other Ambulatory Visit: Payer: Self-pay | Admitting: Family Medicine

## 2019-02-21 NOTE — Chronic Care Management (AMB) (Signed)
  Chronic Care Management   Note  02/21/2019 Name: GRAM CAPLE MRN: [id number] DOB: [birth year]  [name] is a 74 y.o. year old male who is a primary care patient of [name], [name], MD. The CCM team was consulted for assistance with chronic disease management and care coordination needs.    Received voicemail from patient requesting refill on amlodipine. [facility] to [facility] his adherence packing. Reviewed chart; this refill has been sent today. Patient has a call scheduled w/ RN CM this afternoon, will ask Richarda Osmond to let patient know this refill has been sent.   Have also shared most recent glucose readings w/ Richarda Osmond.   [name], PharmD, [place], CPP Clinical Pharmacist [place] [phone]

## 2019-02-21 NOTE — Patient Outreach (Signed)
[place] [facility]) Care Management [place] Telephone Outreach Unsuccessful (consecutive) outreach attempt # 1- previously engaged patient 02/21/2019  [name] 02/08/1945 [id number]  2:10 pm:  Unsuccessfultelephone outreach attemptto Alan Schmidt, 74 y/o male referred to [place] Pharmacist embedded in patient's PCP officefor possible community resource needs in dealing with chronic depression. 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  HIPAA compliant voice mail message left for patient, requesting return call back.  Plan:  Will re-attempt [place] telephone outreach within 4 business days if I do not hear back from patient first.  Oneta Rack, RN, BSN, [place] Coordinator [facility] Care Management  [phone]

## 2019-02-23 ENCOUNTER — Telehealth: Payer: Self-pay | Admitting: *Deleted

## 2019-02-23 NOTE — Progress Notes (Deleted)
Patient: Alan Schmidt  Service Category: E/M  Provider: Gaspar Cola, MD  DOB: 1945-06-22  DOS: 02/27/2019  Location: Office  MRN: [id number]  Setting: Ambulatory outpatient  Referring Provider: [name], MD  Type: Established Patient  Specialty: Interventional Pain Management  PCP: [name], MD  Location: Remote location  Delivery: TeleHealth     Virtual Encounter - Pain Management PROVIDER NOTE: Information contained herein reflects review and annotations entered in association with encounter. Interpretation of such information and data should be left to medically-trained personnel. Information provided to patient can be located elsewhere in the medical record under "Patient Instructions". Document created using STT-dictation technology, any transcriptional errors that may result from process are unintentional.    Contact & Pharmacy Preferred: [phone] Home: [phone] (home) Mobile: [phone] (mobile) E-mail: [email or web address]  [id number], [place] [address] [place] [address] Phone: [phone] Fax: [phone]  [id number] [place], [place] - [address] AT [facility] [address] [place] [address] Phone: [phone] Fax: [phone]   Pre-screening  Mr. Grandville Silos offered "in-person" vs "virtual" encounter. He indicated preferring virtual for this encounter.   Reason COVID-19*  Social distancing based on CDC and AMA recommendations.   I contacted Alan Schmidt on 02/27/2019 via telephone.      I clearly identified myself as Gaspar Cola, MD. I verified that I was speaking with the correct person using two identifiers (Name: HARVEER EDGERSON, and date of birth: Jul 13, 1945).  Consent I sought verbal advanced consent from Alan Schmidt for virtual visit interactions. I informed Mr. Shake of possible security and privacy  concerns, risks, and limitations associated with providing "not-in-person" medical evaluation and management services. I also informed Mr. Mahoney of the availability of "in-person" appointments. Finally, I informed him that there would be a charge for the virtual visit and that he could be  personally, fully or partially, financially responsible for it. Mr. Streetman expressed understanding and agreed to proceed.   Historic Elements   Mr. RECTOR BELYEU is a 74 y.o. year old, male patient evaluated today after his last encounter by our practice on 01/12/2019. Mr. Joyal  has a past medical history of Acute postoperative pain (11/24/2016), Anxiety, Chronic hip pain (Right) (12/05/2014), Chronic lumbar pain, Depression, Hyperlipidemia, Hypertension, Kidney stones, and Migraines. He also  has a past surgical history that includes Total hip arthroplasty; Tonsillectomy; and right hip surgery. Mr. Sohl has a current medication list which includes the following prescription(s): acetaminophen, alprazolam, amlodipine, aspirin ec, bd pen needle nano u/f, carvedilol, freestyle libre 14 day reader, freestyle libre 14 day sensor, desoximetasone, escitalopram, hydrochlorothiazide, insulin lispro, jardiance, loratadine, lubiprostone, meloxicam, metformin, mometasone, naloxone, olmesartan, [START ON 03/01/2019] oxycodone hcl, [START ON 03/31/2019] oxycodone hcl, [START ON 04/30/2019] oxycodone hcl, pantoprazole, pravastatin, sennosides-docusate sodium, tamsulosin, tresiba flextouch, victoza, and benefiber. He  reports that he has quit smoking. He has never used smokeless tobacco. He reports that he does not drink alcohol or use drugs. Mr. Bayani is allergic to contrast media [iodinated diagnostic agents]; iodine; and shellfish allergy.   HPI  Today, he is being contacted for  ***   Pharmacotherapy Assessment  Analgesic: Oxycodone IR 10 mg every 6 hours (40 mg/day) MME/day:60 mg/day.    Monitoring: Pharmacotherapy: No side-effects or adverse reactions reported. [place] PMP: PDMP reviewed during this encounter.       Compliance: No problems identified. Effectiveness: Clinically acceptable. Plan:  Refer to "POC".  UDS:  Summary  Date Value Ref Range Status  03/03/2018 FINAL  Final    Comment:    ==================================================================== TOXASSURE SELECT 13 (MW) ==================================================================== Test                             Result       Flag       Units Drug Present and Declared for Prescription Verification   Alprazolam                     53           EXPECTED   ng/mg creat   Alpha-hydroxyalprazolam        40           EXPECTED   ng/mg creat    Source of alprazolam is a scheduled prescription medication.    Alpha-hydroxyalprazolam is an expected metabolite of alprazolam.   Oxycodone                      1682         EXPECTED   ng/mg creat   Oxymorphone                    262          EXPECTED   ng/mg creat   Noroxycodone                   1203         EXPECTED   ng/mg creat    Sources of oxycodone include scheduled prescription medications.    Oxymorphone and noroxycodone are expected metabolites of    oxycodone. Oxymorphone is also available as a scheduled    prescription medication. Drug Present not Declared for Prescription Verification   Alcohol, Ethyl                 0.052        UNEXPECTED g/dL    Sources of ethyl alcohol include alcoholic beverages or as a    fermentation product of glucose; glucose is present in this    specimen.  Interpret result with caution, as the presence of    ethyl alcohol is likely due, at least in part, to fermentation of    glucose. ==================================================================== Test                      Result    Flag   Units      Ref Range   Creatinine              60               mg/dL       >=20 ==================================================================== Declared Medications:  The flagging and interpretation on this report are based on the  following declared medications.  Unexpected results may arise from  inaccuracies in the declared medications.  **Note: The testing scope of this panel includes these medications:  Alprazolam  Oxycodone  **Note: The testing scope of this panel does not include following  reported medications:  Acetaminophen  Amlodipine Besylate  Aspirin (Aspirin 81)  Carvedilol  Cyclobenzaprine  Desoximetasone TOPICAL  Empagliflozin  Escitalopram  Hydrochlorothiazide  Hydroxyzine  Insulin (Humalog)  Insulin Tyler Aas)  Liraglutide  Loratadine  Magnesium  Metformin  Mometasone  Naloxone  Olmesartan  Pantoprazole  Pravastatin  Tamsulosin ==================================================================== For clinical consultation,  please call [phone]. ====================================================================    Laboratory Chemistry Profile (12 mo)  Renal: 10/26/2018: BUN 12; Creatinine, Ser 1.04  Lab Results  Component Value Date   GFR 84.78 10/26/2018   GFRAA >60 03/26/2018   GFRNONAA >60 03/26/2018   Hepatic: 10/26/2018: Albumin 4.0 Lab Results  Component Value Date   AST 23 10/26/2018   ALT 25 10/26/2018   Other: No results found for requested labs within last 8760 hours.  Note: Above Lab results reviewed.  Imaging  Fluoro (C-Arm) (<60 min) (No Report) Fluoro was used, but no Radiologist interpretation will be provided.  Please refer to "NOTES" tab for provider progress note.   Assessment  There were no encounter diagnoses.  Plan of Care  Problem-specific:  No problem-specific Assessment & Plan notes found for this encounter.  I am having Karmen Stabs. Mcquillen maintain his naloxone, BD Pen Needle Nano U/F, acetaminophen, aspirin EC, Victoza, loratadine, FreeStyle Libre 14 Day Reader, FreeStyle  Libre 14 Day Sensor, desoximetasone, metFORMIN, insulin lispro, tamsulosin, olmesartan, pravastatin, carvedilol, pantoprazole, hydrochlorothiazide, lubiprostone, Benefiber, Tresiba FlexTouch, sennosides-docusate sodium, escitalopram, mometasone, Jardiance, Oxycodone HCl, Oxycodone HCl, Oxycodone HCl, meloxicam, amLODipine, and ALPRAZolam.  Pharmacotherapy (Medications Ordered): No orders of the defined types were placed in this encounter.  Orders:  No orders of the defined types were placed in this encounter.  Follow-up plan:   No follow-ups on file.      Interventional management options: Planned, scheduled, and/or pending:   Therapeutic caudal epidural steroid injection under fluoroscopic guidance and IV sedation   Considering:   Diagnostic Left GONB  Possible Left GON RFA    Palliative PRN treatment(s):   Palliative right lumbar facet block #3  Palliative left lumbar facet block #3  Therapeutic left cervical ESI #2  Palliative left lumbar facet RFA #2 (last done on 02/16/2017) Palliative right lumbar facet RFA #6 (last done on 12/27/2018)     Recent Visits Date Type Provider Dept  02/07/19 Telemedicine Milinda Pointer, [facility]  12/27/18 Procedure visit Milinda Pointer, MD [facility]  12/01/18 Office Visit Milinda Pointer, MD [facility]  Showing recent visits within past 90 days and meeting all other requirements   Future Appointments Date Type Provider Dept  02/27/19 Appointment Milinda Pointer, MD [facility]  Showing future appointments within next 90 days and meeting all other requirements   I discussed the assessment and treatment plan with the patient. The patient was provided an opportunity to ask questions and all were answered. The patient agreed with the plan and demonstrated an understanding of the instructions.  Patient advised to call back or seek an in-person evaluation if the symptoms or condition  worsens.  Duration of encounter: *** minutes.  Note by: Gaspar Cola, MD Date: 02/27/2019; Time: 10:25 AM

## 2019-02-23 NOTE — Telephone Encounter (Signed)
Attempted to call for pre appointment review of allergies/meds. Message left at both numbers. 

## 2019-02-24 ENCOUNTER — Other Ambulatory Visit: Payer: Self-pay | Admitting: *Deleted

## 2019-02-24 ENCOUNTER — Encounter: Payer: Self-pay | Admitting: Pain Medicine

## 2019-02-24 ENCOUNTER — Telehealth: Payer: Self-pay

## 2019-02-24 ENCOUNTER — Encounter: Payer: Self-pay | Admitting: *Deleted

## 2019-02-24 NOTE — Patient Outreach (Addendum)
[facility]) Care Management [place] Telephone Outreach  02/24/2019  Alan Schmidt 1945/07/11 [id number]  [place] outreach attemptto Alan Schmidt, 74 y/o male referred to [place] Pharmacist embedded in patient's PCP officefor possible community resource needs in dealing with chronic depression. 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  Denies new/ recent falls, and reports baseline chronic pain- continues visiting with pain management provider regularly.  Patient sounds to be in no distress throughout phone call today.  Patient further reports:  -- no recent changes to medications; continues working with [place] team ([name]) embedded at PCP office- patient denies concerns around medications, and states that he has several medications ready for pick up at his outpatient pharmacy- plans to send his family to retrieve for him  -- has not yet heard from [place] team from referral placed 01/31/2019- will place second referral today for community resources review for depression resources/ overall evaluation of community resource needs (see note 01/31/2019)  Self-health management of chronic pain and DM: -- reviewed with patient previously provided printed educational material around dietary strategies for self-health management of chronic disease state of DM- patient reports has made numerous dietary changes, which he believes "is working" to lower blood sugars; previously obtained CGM trend graph summary from [place]-- reviewed with patient today and positive reinforcement provided  for stated changes to diet -- discussed signs/ symptoms hypoglycemia, which patient had previously reported as problematic- confirmed that he has had no recent hypoglycemic episodes and discussed action plan for hypoglycemic episodes, both at home and when out -- reviewed blood sugars and A1-C trends and discussed how both correlate with each other -- reports fasting blood sugar this morning of "90" and blood sugar after lunch of "170;" discussed blood sugar goals for both fasting and post-prandial values -- discussed carbohydrate recommendations for each day/ meal, and discussed importance of including fiber with carbohydrates to maximumize sugar metabolism  Patient denies further issues, concerns, or problems today. I confirmed patient hasmy direct phone number, the main [facility] CM office phone number, and the [facility] CM 24-hour nurse advice phone number should issues arise prior to next scheduled [place] outreach next month.  Encouraged patient to contact me directly if needs, questions, issues, or concerns arise prior to next scheduled outreach; patient agreed to do so.  Plan:  Patient will take medications as prescribed and will attend all scheduled provider appointments  Patient will promptly notify care providers for any new concerns/ issues/ problems that arise  Patient will continue monitoring/ recording blood sugars at home using [name] CGM system  Patient will engage with [facility] CSW and will maintain engagement with [facility] Pharmacist embedded at his PCP office  I will share today's [facility] CM note/ care plan with patient's PCP as initial assessment  [place] outreach to continue with scheduled phone call next month  [facility] CM Care Plan Problem One     Most Recent Value  Care Plan Problem One  Self-health management of chronic disease state of Diabetes in patient with depression as evidenced by patient reporting  Role Documenting the Problem One  Care Management  Coordinator  Care Plan for Problem One  Active  [facility] Long Term Goal   Over the next 60 days, patient will verbalize 3 dietary strategies for blood sugar management as evidenced by patient reporting during [facility] RN CM outreach  [place] Term Goal Start Date  01/31/19  Interventions for Problem One Long Term Goal  Confirmed that patient received previously provided printed educational material around dietary self- management of DM and using teach back method, reviewed all with patient- discussed and reiterated strategies and enocuraged patient to continue making small changes that can be incorporated into his daily routine,  specifically reviewed with patient daily carbohydrate intake recommendations and value of adding fiber to diet  [facility] CM Short Term Goal #1   Over the next 30 days, patient will continue monitoring and recording blood sugars at home as evdienced by patient reporting and review of same during [facility] RN CM outreach [Goal extended today]  [facility] CM Short Term Goal #1 Start Date  02/24/19 [Goal extended]  Interventions for Short Term Goal #1  Obtained blood sugar summary from patient's CGM from [facility] Pharmacist and reviewed all recent trends with patient,  reviewed A1-C trends with patient and confirmed that patient continues to monitor blood sugars at home,  confirmed that patient has not had any recent hypoglycemic episodes and discussed signs/ symptoms and corresponding action plan for hypoglycemic episodes, both at home and away from home   [facility] CM Short Term Goal #2   Over the next 21 days, patient will discuss his depression and community resource needs with [facility] CSW, as evidenced by patient reporting and collaboration with [facility] CSW as indicated during [facility] RN CM outreach [Goal extended]  [facility] CM Short Term Goal #2 Start Date  02/24/19 [Goal extended]  Interventions for Short Term Goal #2  Placed care coordination outreach previously this week to follow up on [facility] CSW referral,  placed another [facility] CSW  referral today and encouraged patient to listen for CSW outreach     Oneta Rack, RN, BSN, [employer] Coordinator [facility] Care Management  [phone]

## 2019-02-24 NOTE — Telephone Encounter (Signed)
LM for patient to call us for pre virtual appointment questions.    

## 2019-02-27 ENCOUNTER — Telehealth (HOSPITAL_BASED_OUTPATIENT_CLINIC_OR_DEPARTMENT_OTHER): Payer: Medicare Other | Admitting: Pain Medicine

## 2019-02-27 ENCOUNTER — Telehealth: Payer: Self-pay

## 2019-02-27 ENCOUNTER — Other Ambulatory Visit: Payer: Self-pay | Admitting: Pain Medicine

## 2019-02-27 DIAGNOSIS — Z5329 Procedure and treatment not carried out because of patient's decision for other reasons: Secondary | ICD-10-CM

## 2019-02-27 DIAGNOSIS — M47816 Spondylosis without myelopathy or radiculopathy, lumbar region: Secondary | ICD-10-CM

## 2019-02-27 NOTE — Telephone Encounter (Signed)
Sent message to Dr Dossie Arbour to get order placed.

## 2019-02-27 NOTE — Telephone Encounter (Signed)
Patient would like to go ahead and get the left RFA. Is this ok with the doctor?

## 2019-03-04 ENCOUNTER — Other Ambulatory Visit: Payer: Self-pay | Admitting: Family Medicine

## 2019-03-13 DIAGNOSIS — Z794 Long term (current) use of insulin: Secondary | ICD-10-CM | POA: Diagnosis not present

## 2019-03-13 DIAGNOSIS — E118 Type 2 diabetes mellitus with unspecified complications: Secondary | ICD-10-CM | POA: Diagnosis not present

## 2019-03-21 ENCOUNTER — Ambulatory Visit: Payer: Medicare Other | Admitting: Pain Medicine

## 2019-03-21 NOTE — Progress Notes (Deleted)
PROVIDER NOTE: Information contained herein reflects review and annotations entered in association with encounter. Interpretation of such information and data should be left to medically-trained personnel. Information provided to patient can be located elsewhere in the medical record under "Patient Instructions". Document created using STT-dictation technology, any transcriptional errors that may result from process are unintentional.    Patient: Alan Schmidt  Service Category: Procedure  Provider: Gaspar Cola, MD  DOB: 10-31-1945  DOS: 03/21/2019  Location: [facility]  MRN: [id number]  Setting: Ambulatory - outpatient  Referring Provider: Milinda Pointer, MD  Type: Established Patient  Specialty: Interventional Pain Management  PCP: [name], MD   Primary Reason for Visit: Interventional Pain Management Treatment. CC: No chief complaint on file.  Procedure:          Anesthesia, Analgesia, Anxiolysis:  Type: Thermal Lumbar Facet, Medial Branch Radiofrequency Ablation/Neurotomy  #2  Primary Purpose: Therapeutic Region: Posterolateral Lumbosacral Spine Level: L2, L3, L4, L5, & S1 Medial Branch Level(s). These levels will denervate the L3-4, L4-5, and the L5-S1 lumbar facet joints. Laterality: Left  Type: Moderate (Conscious) Sedation combined with Local Anesthesia Indication(s): Analgesia and Anxiety Route: Intravenous (IV) IV Access: Secured Sedation: Meaningful verbal contact was maintained at all times during the procedure  Local Anesthetic: Lidocaine 1-2%  Position: Prone   Indications: 1. Lumbar facet syndrome (Bilateral) (R>L)   2. Spondylosis without myelopathy or radiculopathy, lumbosacral region   3. Lumbar facet joint osteoarthritis (Bilateral)   4. DDD (degenerative disc disease), lumbar   5. Chronic low back pain (Primary Source of Pain) (Bilateral) (R>L)    Alan Schmidt has been dealing with the above chronic pain for longer than  three months and has either failed to respond, was unable to tolerate, or simply did not get enough benefit from other more conservative therapies including, but not limited to: 1. Over-the-counter medications 2. Anti-inflammatory medications 3. Muscle relaxants 4. Membrane stabilizers 5. Opioids 6. Physical therapy and/or chiropractic manipulation 7. Modalities (Heat, ice, etc.) 8. Invasive techniques such as nerve blocks. Alan Schmidt has attained more than 50% relief of the pain from a series of diagnostic injections conducted in separate occasions.  Pain Score: Pre-procedure:  /10 Post-procedure:  /10  Pre-op Assessment:  Alan Schmidt is a 74 y.o. (year old), male patient, seen today for interventional treatment. He  has a past surgical history that includes Total hip arthroplasty; Tonsillectomy; and right hip surgery. Alan Schmidt has a current medication list which includes the following prescription(s): acetaminophen, alprazolam, amlodipine, aspirin ec, bd pen needle nano u/f, carvedilol, freestyle libre 14 day reader, freestyle libre 14 day sensor, desoximetasone, escitalopram, hydrochlorothiazide, insulin lispro, jardiance, loratadine, lubiprostone, meloxicam, metformin, mometasone, naloxone, olmesartan, oxycodone hcl, [START ON 03/31/2019] oxycodone hcl, [START ON 04/30/2019] oxycodone hcl, pantoprazole, pravastatin, sennosides-docusate sodium, tamsulosin, tresiba flextouch, victoza, and benefiber. His primarily concern today is the No chief complaint on file.  Initial Vital Signs:  Pulse/HCG Rate:    Temp:   Resp:   BP:   SpO2:    BMI: Estimated body mass index is 32.98 kg/m as calculated from the following:   Height as of 12/27/18: 6\' 1"  (1.854 m).   Weight as of 12/27/18: 250 lb (113.4 kg).  Risk Assessment: Allergies: Reviewed. He is allergic to contrast media [iodinated diagnostic agents]; iodine; and shellfish allergy.  Allergy Precautions: None required Coagulopathies:  Reviewed. None identified.  Blood-thinner therapy: None at this time Active Infection(s): Reviewed. None identified. Alan Schmidt is afebrile  Site  Confirmation: Alan Schmidt was asked to confirm the procedure and laterality before marking the site Procedure checklist: Completed Consent: Before the procedure and under the influence of no sedative(s), amnesic(s), or anxiolytics, the patient was informed of the treatment options, risks and possible complications. To fulfill our ethical and legal obligations, as recommended by the American Medical Association's Code of Ethics, I have informed the patient of my clinical impression; the nature and purpose of the treatment or procedure; the risks, benefits, and possible complications of the intervention; the alternatives, including doing nothing; the risk(s) and benefit(s) of the alternative treatment(s) or procedure(s); and the risk(s) and benefit(s) of doing nothing. The patient was provided information about the general risks and possible complications associated with the procedure. These may include, but are not limited to: failure to achieve desired goals, infection, bleeding, organ or nerve damage, allergic reactions, paralysis, and death. In addition, the patient was informed of those risks and complications associated to Spine-related procedures, such as failure to decrease pain; infection (i.e.: Meningitis, epidural or intraspinal abscess); bleeding (i.e.: epidural hematoma, subarachnoid hemorrhage, or any other type of intraspinal or peri-dural bleeding); organ or nerve damage (i.e.: Any type of peripheral nerve, nerve root, or spinal cord injury) with subsequent damage to sensory, motor, and/or autonomic systems, resulting in permanent pain, numbness, and/or weakness of one or several areas of the body; allergic reactions; (i.e.: anaphylactic reaction); and/or death. Furthermore, the patient was informed of those risks and complications associated with  the medications. These include, but are not limited to: allergic reactions (i.e.: anaphylactic or anaphylactoid reaction(s)); adrenal axis suppression; blood sugar elevation that in diabetics may result in ketoacidosis or comma; water retention that in patients with history of congestive heart failure may result in shortness of breath, pulmonary edema, and decompensation with resultant heart failure; weight gain; swelling or edema; medication-induced neural toxicity; particulate matter embolism and blood vessel occlusion with resultant organ, and/or nervous system infarction; and/or aseptic necrosis of one or more joints. Finally, the patient was informed that Medicine is not an exact science; therefore, there is also the possibility of unforeseen or unpredictable risks and/or possible complications that may result in a catastrophic outcome. The patient indicated having understood very clearly. We have given the patient no guarantees and we have made no promises. Enough time was given to the patient to ask questions, all of which were answered to the patient's satisfaction. Alan Schmidt has indicated that he wanted to continue with the procedure. Attestation: I, the ordering provider, attest that I have discussed with the patient the benefits, risks, side-effects, alternatives, likelihood of achieving goals, and potential problems during recovery for the procedure that I have provided informed consent. Date  Time: {CHL [facility]-PAIN TIME CHOICES:21018001}  Pre-Procedure Preparation:  Monitoring: As per clinic protocol. Respiration, ETCO2, SpO2, BP, heart rate and rhythm monitor placed and checked for adequate function Safety Precautions: Patient was assessed for positional comfort and pressure points before starting the procedure. Time-out: I initiated and conducted the "Time-out" before starting the procedure, as per protocol. The patient was asked to participate by confirming the accuracy of the "Time Out"  information. Verification of the correct person, site, and procedure were performed and confirmed by me, the nursing staff, and the patient. "Time-out" conducted as per Joint Commission's Universal Protocol (UP.01.01.01). Time:    Description of Procedure:          Laterality: Left Levels:  L2, L3, L4, L5, & S1 Medial Branch Level(s), at the L3-4, L4-5, and the  L5-S1 lumbar facet joints. Area Prepped: Lumbosacral Prepping solution: ChloraPrep (2% chlorhexidine gluconate and 70% isopropyl alcohol) Safety Precautions: Aspiration looking for blood return was conducted prior to all injections. At no point did we inject any substances, as a needle was being advanced. Before injecting, the patient was told to immediately notify me if he was experiencing any new onset of "ringing in the ears, or metallic taste in the mouth". No attempts were made at seeking any paresthesias. Safe injection practices and needle disposal techniques used. Medications properly checked for expiration dates. SDV (single dose vial) medications used. After the completion of the procedure, all disposable equipment used was discarded in the proper designated medical waste containers. Local Anesthesia: Protocol guidelines were followed. The patient was positioned over the fluoroscopy table. The area was prepped in the usual manner. The time-out was completed. The target area was identified using fluoroscopy. A 12-in long, straight, sterile hemostat was used with fluoroscopic guidance to locate the targets for each level blocked. Once located, the skin was marked with an approved surgical skin marker. Once all sites were marked, the skin (epidermis, dermis, and hypodermis), as well as deeper tissues (fat, connective tissue and muscle) were infiltrated with a small amount of a short-acting local anesthetic, loaded on a 10cc syringe with a 25G, 1.5-in  Needle. An appropriate amount of time was allowed for local anesthetics to take effect before  proceeding to the next step. Local Anesthetic: Lidocaine 2.0% The unused portion of the local anesthetic was discarded in the proper designated containers. Technical explanation of process:  Radiofrequency Ablation (RFA) L2 Medial Branch Nerve RFA: The target area for the L2 medial branch is at the junction of the postero-lateral aspect of the superior articular process and the superior, posterior, and medial edge of the transverse process of L3. Under fluoroscopic guidance, a Radiofrequency needle was inserted until contact was made with os over the superior postero-lateral aspect of the pedicular shadow (target area). Sensory and motor testing was conducted to properly adjust the position of the needle. Once satisfactory placement of the needle was achieved, the numbing solution was slowly injected after negative aspiration for blood. 2.0 mL of the nerve block solution was injected without difficulty or complication. After waiting for at least 3 minutes, the ablation was performed. Once completed, the needle was removed intact. L3 Medial Branch Nerve RFA: The target area for the L3 medial branch is at the junction of the postero-lateral aspect of the superior articular process and the superior, posterior, and medial edge of the transverse process of L4. Under fluoroscopic guidance, a Radiofrequency needle was inserted until contact was made with os over the superior postero-lateral aspect of the pedicular shadow (target area). Sensory and motor testing was conducted to properly adjust the position of the needle. Once satisfactory placement of the needle was achieved, the numbing solution was slowly injected after negative aspiration for blood. 2.0 mL of the nerve block solution was injected without difficulty or complication. After waiting for at least 3 minutes, the ablation was performed. Once completed, the needle was removed intact. L4 Medial Branch Nerve RFA: The target area for the L4 medial branch is  at the junction of the postero-lateral aspect of the superior articular process and the superior, posterior, and medial edge of the transverse process of L5. Under fluoroscopic guidance, a Radiofrequency needle was inserted until contact was made with os over the superior postero-lateral aspect of the pedicular shadow (target area). Sensory and motor testing was conducted to  properly adjust the position of the needle. Once satisfactory placement of the needle was achieved, the numbing solution was slowly injected after negative aspiration for blood. 2.0 mL of the nerve block solution was injected without difficulty or complication. After waiting for at least 3 minutes, the ablation was performed. Once completed, the needle was removed intact. L5 Medial Branch Nerve RFA: The target area for the L5 medial branch is at the junction of the postero-lateral aspect of the superior articular process of S1 and the superior, posterior, and medial edge of the sacral ala. Under fluoroscopic guidance, a Radiofrequency needle was inserted until contact was made with os over the superior postero-lateral aspect of the pedicular shadow (target area). Sensory and motor testing was conducted to properly adjust the position of the needle. Once satisfactory placement of the needle was achieved, the numbing solution was slowly injected after negative aspiration for blood. 2.0 mL of the nerve block solution was injected without difficulty or complication. After waiting for at least 3 minutes, the ablation was performed. Once completed, the needle was removed intact. S1 Medial Branch Nerve RFA: The target area for the S1 medial branch is located inferior to the junction of the S1 superior articular process and the L5 inferior articular process, posterior, inferior, and lateral to the 6 o'clock position of the L5-S1 facet joint, just superior to the S1 posterior foramen. Under fluoroscopic guidance, the Radiofrequency needle was advanced  until contact was made with os over the Target area. Sensory and motor testing was conducted to properly adjust the position of the needle. Once satisfactory placement of the needle was achieved, the numbing solution was slowly injected after negative aspiration for blood. 2.0 mL of the nerve block solution was injected without difficulty or complication. After waiting for at least 3 minutes, the ablation was performed. Once completed, the needle was removed intact. Radiofrequency lesioning (ablation):  Radiofrequency Generator: NeuroTherm [id number] Sensory Stimulation Parameters: 50 Hz was used to locate & identify the nerve, making sure that the needle was positioned such that there was no sensory stimulation below 0.3 V or above 0.7 V. Motor Stimulation Parameters: 2 Hz was used to evaluate the motor component. Care was taken not to lesion any nerves that demonstrated motor stimulation of the lower extremities at an output of less than 2.5 times that of the sensory threshold, or a maximum of 2.0 V. Lesioning Technique Parameters: Standard Radiofrequency settings. (Not bipolar or pulsed.) Temperature Settings: 80 degrees C Lesioning time: 60 seconds Intra-operative Compliance: Compliant Materials & Medications: Needle(s) (Electrode/Cannula) Type: Teflon-coated, curved tip, Radiofrequency needle(s) Gauge: 22G Length: 10cm Numbing solution: 0.2% PF-Ropivacaine + Triamcinolone (40 mg/mL) diluted to a final concentration of 4 mg of Triamcinolone/mL of Ropivacaine The unused portion of the solution was discarded in the proper designated containers.  Once the entire procedure was completed, the treated area was cleaned, making sure to leave some of the prepping solution back to take advantage of its long term bactericidal properties.  Illustration of the posterior view of the lumbar spine and the posterior neural structures. Laminae of L2 through S1 are labeled. DPRL5, dorsal primary ramus of L5; DPRS1,  dorsal primary ramus of S1; DPR3, dorsal primary ramus of L3; FJ, facet (zygapophyseal) joint L3-L4; I, inferior articular process of L4; LB1, lateral branch of dorsal primary ramus of L1; IAB, inferior articular branches from L3 medial branch (supplies L4-L5 facet joint); IBP, intermediate branch plexus; MB3, medial branch of dorsal primary ramus of L3; NR3, third lumbar nerve  root; S, superior articular process of L5; SAB, superior articular branches from L4 (supplies L4-5 facet joint also); TP3, transverse process of L3.  There were no vitals filed for this visit. Start Time:   hrs. End Time:   hrs.  Imaging Guidance (Spinal):          Type of Imaging Technique: Fluoroscopy Guidance (Spinal) Indication(s): Assistance in needle guidance and placement for procedures requiring needle placement in or near specific anatomical locations not easily accessible without such assistance. Exposure Time: Please see nurses notes. Contrast: None used. Fluoroscopic Guidance: I was personally present during the use of fluoroscopy. "Tunnel Vision Technique" used to obtain the best possible view of the target area. Parallax error corrected before commencing the procedure. "Direction-depth-direction" technique used to introduce the needle under continuous pulsed fluoroscopy. Once target was reached, antero-posterior, oblique, and lateral fluoroscopic projection used confirm needle placement in all planes. Images permanently stored in EMR. Interpretation: No contrast injected. I personally interpreted the imaging intraoperatively. Adequate needle placement confirmed in multiple planes. Permanent images saved into the patient's record.  Antibiotic Prophylaxis:   Anti-infectives (From admission, onward)   None     Indication(s): None identified  Post-operative Assessment:  Post-procedure Vital Signs:  Pulse/HCG Rate:    Temp:   Resp:   BP:   SpO2:    EBL: None  Complications: No immediate post-treatment  complications observed by team, or reported by patient.  Note: The patient tolerated the entire procedure well. A repeat set of vitals were taken after the procedure and the patient was kept under observation following institutional policy, for this type of procedure. Post-procedural neurological assessment was performed, showing return to baseline, prior to discharge. The patient was provided with post-procedure discharge instructions, including a section on how to identify potential problems. Should any problems arise concerning this procedure, the patient was given instructions to immediately contact us, at any time, without hesitation. In any case, we plan to contact the patient by telephone for a follow-up status report regarding this interventional procedure.  Comments:  No additional relevant information.  Plan of Care  Orders:  No orders of the defined types were placed in this encounter.  Chronic Opioid Analgesic:  Oxycodone IR 10 mg every 6 hours (40 mg/day) MME/day:60 mg/day.   Medications ordered for procedure: No orders of the defined types were placed in this encounter.  Medications administered: Karmen Stabs. Schmidt had no medications administered during this visit.  See the medical record for exact dosing, route, and time of administration.  Follow-up plan:   No follow-ups on file.       Interventional management options: Planned, scheduled, and/or pending:   Therapeutic caudal epidural steroid injection under fluoroscopic guidance and IV sedation   Considering:   Diagnostic Left GONB  Possible Left GON RFA    Palliative PRN treatment(s):   Palliative right lumbar facet block #3  Palliative left lumbar facet block #3  Therapeutic left cervical ESI #2  Palliative left lumbar facet RFA #2 (last done on 02/16/2017) Palliative right lumbar facet RFA #6 (last done on 12/27/2018)     Recent Visits Date Type Provider Dept  02/07/19 Telemedicine Milinda Pointer, [facility]  12/27/18 Procedure visit Milinda Pointer, MD [facility]  Showing recent visits within past 90 days and meeting all other requirements   Today's Visits Date Type Provider Dept  03/21/19 Appointment Milinda Pointer, MD [facility]  Showing today's visits and meeting all other requirements  Future Appointments Date Type Provider Dept  05/24/19 Appointment Milinda Pointer, MD [facility]  Showing future appointments within next 90 days and meeting all other requirements   Disposition: Discharge home  Discharge (Date  Time): 03/21/2019;   hrs.   Primary Care Physician: [name], MD Location: [facility] Note by: Gaspar Cola, MD Date: 03/21/2019; Time: 7:11 AM  Disclaimer:  Medicine is not an Chief Strategy Officer. The only guarantee in medicine is that nothing is guaranteed. It is important to note that the decision to proceed with this intervention was based on the information collected from the patient. The Data and conclusions were drawn from the patient's questionnaire, the interview, and the physical examination. Because the information was provided in large part by the patient, it cannot be guaranteed that it has not been purposely or unconsciously manipulated. Every effort has been made to obtain as much relevant data as possible for this evaluation. It is important to note that the conclusions that lead to this procedure are derived in large part from the available data. Always take into account that the treatment will also be dependent on availability of resources and existing treatment guidelines, considered by other Pain Management Practitioners as being common knowledge and practice, at the time of the intervention. For Medico-Legal purposes, it is also important to point out that variation in procedural techniques and pharmacological choices are the acceptable norm. The indications,  contraindications, technique, and results of the above procedure should only be interpreted and judged by a Board-Certified Interventional Pain Specialist with extensive familiarity and expertise in the same exact procedure and technique.

## 2019-03-24 ENCOUNTER — Other Ambulatory Visit: Payer: Self-pay | Admitting: *Deleted

## 2019-03-24 ENCOUNTER — Encounter: Payer: Self-pay | Admitting: *Deleted

## 2019-03-24 NOTE — Patient Outreach (Signed)
[place] [facility]) Care Management [place] Telephone Outreach Unsuccessful (consecutive) outreach attempt # 1- previously engaged patient  03/24/2019  [name] [birth year] [id number]  Unsuccessfultelephone outreach attemptto Alan Schmidt, 74 y/o male referred to [place] Pharmacist embedded in patient's PCP officefor possible community resource needs in dealing with chronic depression. 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  HIPAA compliant voice mail message left for patient, requesting return call back.  Plan:  Will re-attempt [place] telephone outreach within 4 business days if I do not hear back from patient first.  Oneta Rack, RN, BSN, [place] Coordinator [facility]  [phone]

## 2019-03-27 ENCOUNTER — Ambulatory Visit (INDEPENDENT_AMBULATORY_CARE_PROVIDER_SITE_OTHER): Payer: Medicare Other | Admitting: Pharmacist

## 2019-03-27 DIAGNOSIS — Z794 Long term (current) use of insulin: Secondary | ICD-10-CM

## 2019-03-27 DIAGNOSIS — G8929 Other chronic pain: Secondary | ICD-10-CM

## 2019-03-27 DIAGNOSIS — E1142 Type 2 diabetes mellitus with diabetic polyneuropathy: Secondary | ICD-10-CM | POA: Diagnosis not present

## 2019-03-27 NOTE — Patient Instructions (Signed)
Visit Information  Goals Addressed            This Visit's Progress     Patient Stated   . "I want to keep an eye on my diabetes" (pt-stated)       Current Barriers:  . Diabetes: uncontrolled though improved per CGM results; most recent A1c 8.5% . [facility]  . Current antihyperglycemic regimen: metformin 1000 mg BID, Jardiance 25 mg daily, Victoza 1.8 mg daily, Tresiba 64 units daily, Humalog 12 with meals 3x daily, though occasionally using 8-10 units if smaller meals  . Current blood glucose readings: CGM has not been transmitting since 02/13/2019 o Reports fastings ~90-120s; reports that sugars have been better controlled recently  o Average: last 7 days: 126; last 14 days 128; last 30 days 128 . Current meals:  o Notes he keeps frozen meals around o Grapes, tangerines for snacks o Has been drinking flavored water (Crystal Lite) o Has cut down on sweet tea . Cardiovascular risk reduction: o Current hypertensive regimen: amlodipine 5 mg daily, carvedilol 25 mg BID, HCTZ 25 mg daily, olmesartan 40 mg daily;  o Current hyperlipidemia regimen: pravastatin 40 mg daily; LDL well controlled last check <70 . Mental health concerns: escitalopram 20 mg daily, alprazolam 0.5 mg PRN;  . Chronic pain: Oxycodone 10 mg 6H PRN, meloxicam 15 mg daily o Reports that he missed his RFA appointment last week because he didn't have transportation o Patient asked if he could stop meloxicam for a few days, take ibuprofen, then go back on meloxicam o Taking senna PRN and fiber for constipation  Pharmacist Clinical Goal(s):  Marland Kitchen Over the next 90 days, patient with work with PharmD and primary care provider to address optimized diabetes management  Interventions: . Comprehensive medication review; medication list updated in electronic medication . Patient overdue for f/u with PCP and lab work, though he is concerned about coming into the office for an appointment given  pandemic. Notes he will feel better about coming in for lab work after he receives his second [place] vaccine on 04/20/19. Will collaborate w/ office staff to schedule appointment w/ PCP  . Give transportation concerns causing a missed appointment last week, placed referral for Care Guide to evaluate transportation resources.  . Sent link to patient's LibreView to hopefully reconnect remote transmission of CGM downloads.  . Encouraged not to switch back and forth between meloxicam and ibuprofen, and instead remain on meloxicam daily. Counseled on max acetaminophen daily dosing of 4 g.   Patient Self Care Activities:  . Patient will check blood glucose with CGM, document, and provide at future appointment . Patient will take medications as prescribed . Patient will report any questions or concerns to provider   Please see past updates related to this goal by clicking on the "Past Updates" button in the selected goal         Patient verbalizes understanding of instructions provided today.   Plan:  - Scheduled f/u call 05/15/19  [name], PharmD, BCACP, CPP Clinical Pharmacist [place] [phone]

## 2019-03-27 NOTE — Chronic Care Management (AMB) (Signed)
Chronic Care Management   Follow Up Note   03/27/2019 Name: Alan Schmidt MRN: [id number] DOB: 1945/03/29  Referred by: Alan Haven, MD Reason for referral : Chronic Care Management (Medication Management)   Alan Schmidt is a 74 y.o. year old male who is a primary care patient of Alan Schmidt, Alan Adam, MD. The CCM team was consulted for assistance with chronic disease management and care coordination needs.    Contacted patient as scheduled for medication management review.   Review of patient status, including review of consultants reports, relevant laboratory and other test results, and collaboration with appropriate care team members and the patient's provider was performed as part of comprehensive patient evaluation and provision of chronic care management services.    SDOH (Social Determinants of Health) assessments performed: Yes SDOH Interventions     Most Recent Value  SDOH Interventions  SDOH Interventions for the Following Domains  Transportation  Transportation Interventions  Other (Comment) [Care Guide referral]       Outpatient Encounter Medications as of 03/27/2019  Medication Sig Note  . acetaminophen (TYLENOL) 500 MG tablet Take 500 mg by mouth every 6 (six) hours as needed. 05/16/2018: PRN  . amLODipine (NORVASC) 5 MG tablet TAKE 1 TABLET BY MOUTH EVERY DAY   . carvedilol (COREG) 25 MG tablet TAKE 1 TABLET BY MOUTH TWICE A DAY   . Continuous Blood Gluc Receiver (FREESTYLE LIBRE 14 DAY READER) DEVI 1 Device by Does not apply route daily. Use to scan to check blood sugar up to 7 times daily; E11.42,   . Continuous Blood Gluc Sensor (FREESTYLE LIBRE 14 DAY SENSOR) MISC 1 Device by Does not apply route every 14 (fourteen) days. E11.42   . escitalopram (LEXAPRO) 20 MG tablet Take 1 tablet (20 mg total) by mouth daily.   . hydrochlorothiazide (HYDRODIURIL) 25 MG tablet TAKE 1 TABLET BY MOUTH EVERY DAY   . meloxicam (MOBIC) 15 MG tablet Take 1 tablet (15 mg  total) by mouth daily.   . metFORMIN (GLUCOPHAGE) 1000 MG tablet TAKE 1 TABLET BY MOUTH TWICE A DAY WITH A MEAL   . olmesartan (BENICAR) 40 MG tablet TAKE 1 TABLET BY MOUTH EVERY DAY   . Oxycodone HCl 10 MG TABS Take 1 tablet (10 mg total) by mouth every 6 (six) hours as needed. Must last 30 days   . pantoprazole (PROTONIX) 40 MG tablet TAKE 1 TABLET BY MOUTH EVERY DAY   . pravastatin (PRAVACHOL) 40 MG tablet TAKE 1 TABLET BY MOUTH DAILY   . VICTOZA 18 MG/3ML SOPN INJECT 0.3 MLS (1.8 MG TOTAL) INTO THE SKIN DAILY.   Marland Kitchen ALPRAZolam (XANAX) 1 MG tablet TAKE 0.5 TABLETS (0.5 MG TOTAL) BY MOUTH 2 (TWO) TIMES DAILY AS NEEDED FOR ANXIETY.   Marland Kitchen aspirin EC 81 MG tablet Take 81 mg by mouth daily.   . BD PEN NEEDLE NANO U/F 32G X 4 MM MISC USE EVERY DAY   . desoximetasone (TOPICORT) 0.25 % cream APPLY CREAM TO AFFECTED AREA TWO TIMES DAILY, FOR UP TO 7 DAYS, DO NOT APPLY TO FACE   . insulin lispro (HUMALOG KWIKPEN) 100 UNIT/ML KwikPen INJECT 0.12-0.18 MLS (12-18 UNITS TOTAL) INTO THE SKIN 2 (TWO) TIMES DAILY WITH A MEAL. 02/13/2019: 12 units   . JARDIANCE 25 MG TABS tablet TAKE 1 TABLET BY MOUTH EVERY DAY   . loratadine (CLARITIN) 10 MG tablet Take 1 tablet (10 mg total) by mouth daily.   Marland Kitchen lubiprostone (AMITIZA) 8 MCG capsule Take 1  capsule (8 mcg total) by mouth 2 (two) times daily with a meal. Swallow the medication whole. Do not break or chew the medication. 01/12/2019: Taking PRN constipation  . mometasone (NASONEX) 50 MCG/ACT nasal spray INSTILL TWO SPRAYS INTO THE NOSE DAILY   . naloxone [facility]) 2 MG/2ML injection Inject content of syringe into thigh muscle. Call 911. 02/07/2019: >>>DO NOT DELETE!!!<<<  This is a PRN medication for Emergencies only. Does not qualify as "TAKING" or "NOT TAKING".   Derrill Memo ON 03/31/2019] Oxycodone HCl 10 MG TABS Take 1 tablet (10 mg total) by mouth every 6 (six) hours as needed. Must last 30 days   . [START ON 04/30/2019] Oxycodone HCl 10 MG TABS Take 1 tablet (10 mg total)  by mouth every 6 (six) hours as needed. Must last 30 days   . sennosides-docusate sodium (SENOKOT-S) 8.6-50 MG tablet Take 1 tablet by mouth daily.   . tamsulosin (FLOMAX) 0.4 MG CAPS capsule TAKE 1 CAPSULE (0.4 MG TOTAL) BY MOUTH 2 (TWO) TIMES DAILY.   Marland Kitchen TRESIBA FLEXTOUCH 100 UNIT/ML SOPN FlexTouch Pen INJECT 40 UNITS INTO THE SKIN DAILY AT 10 PM. 01/12/2019: 46 units   . Wheat Dextrin (BENEFIBER) POWD Take 6 g by mouth 3 (three) times daily before meals. (2 tsp = 6 g)    No facility-administered encounter medications on file as of 03/27/2019.     Objective:   Goals Addressed            This Visit's Progress     Patient Stated   . "I want to keep an eye on my diabetes" (pt-stated)       Current Barriers:  . Diabetes: uncontrolled though improved per CGM results; most recent A1c 8.5% . [facility]  . Current antihyperglycemic regimen: metformin 1000 mg BID, Jardiance 25 mg daily, Victoza 1.8 mg daily, Tresiba 64 units daily, Humalog 12 with meals 3x daily, though occasionally using 8-10 units if smaller meals  . Current blood glucose readings: CGM has not been transmitting since 02/13/2019 o Reports fastings ~90-120s; reports that sugars have been better controlled recently  o Average: last 7 days: 126; last 14 days 128; last 30 days 128 . Current meals:  o Notes he keeps frozen meals around o Grapes, tangerines for snacks o Has been drinking flavored water (Crystal Lite) o Has cut down on sweet tea . Cardiovascular risk reduction: o Current hypertensive regimen: amlodipine 5 mg daily, carvedilol 25 mg BID, HCTZ 25 mg daily, olmesartan 40 mg daily;  o Current hyperlipidemia regimen: pravastatin 40 mg daily; LDL well controlled last check <70 . Mental health concerns: escitalopram 20 mg daily, alprazolam 0.5 mg PRN;  . Chronic pain: Oxycodone 10 mg 6H PRN, meloxicam 15 mg daily o Reports that he missed his RFA appointment last week because he didn't have  transportation o Patient asked if he could stop meloxicam for a few days, take ibuprofen, then go back on meloxicam o Taking senna PRN and fiber for constipation  Pharmacist Clinical Goal(s):  Marland Kitchen Over the next 90 days, patient with work with PharmD and primary care provider to address optimized diabetes management  Interventions: . Comprehensive medication review; medication list updated in electronic medication . Patient overdue for f/u with PCP and lab work, though he is concerned about coming into the office for an appointment given pandemic. Notes he will feel better about coming in for lab work after he receives his second [place] vaccine on 04/20/19. Will collaborate  w/ office staff to schedule appointment w/ PCP  . Give transportation concerns causing a missed appointment last week, placed referral for Care Guide to evaluate transportation resources.  . Sent link to patient's LibreView to hopefully reconnect remote transmission of CGM downloads.  . Encouraged not to switch back and forth between meloxicam and ibuprofen, and instead remain on meloxicam daily. Counseled on max acetaminophen daily dosing of 4 g.   Patient Self Care Activities:  . Patient will check blood glucose with CGM, document, and provide at future appointment . Patient will take medications as prescribed . Patient will report any questions or concerns to provider   Please see past updates related to this goal by clicking on the "Past Updates" button in the selected goal          Plan:  - Scheduled f/u call 05/15/19  [name], PharmD, BCACP, [place] Pharmacist [place] [phone]

## 2019-03-28 ENCOUNTER — Ambulatory Visit: Payer: Self-pay | Admitting: Pharmacist

## 2019-03-28 DIAGNOSIS — E1142 Type 2 diabetes mellitus with diabetic polyneuropathy: Secondary | ICD-10-CM

## 2019-03-28 NOTE — Chronic Care Management (AMB) (Signed)
  Chronic Care Management   Note  03/28/2019 Name: Alan Schmidt MRN: [id number] DOB: [birth year]  [name] is a 74 y.o. year old male who is a primary care patient of [name], [name], MD. The CCM team was consulted for assistance with chronic disease management and care coordination needs.    Patient left me a voicemail after hours noting that he thought he had gotten LibreView to work that it would transmit his glucose readings to me. However, when I log onto LibreView, there are still no uploads since 02/13/19.   Provided patient the [place] Customer Service number [phone] to hopefully help him troubleshoot reconnecting.  [name], PharmD, [place], CPP Clinical Pharmacist [place] [phone]

## 2019-03-28 NOTE — Progress Notes (Signed)
Cancelled by patient

## 2019-03-29 ENCOUNTER — Telehealth: Payer: Self-pay

## 2019-03-29 NOTE — Telephone Encounter (Signed)
03/29/2019 Left message on home and cell voicemail rgarding information for Medicaid Transportation.  Spoke with  [place] Medicaid transportation, they  require the person or a family member to call them to signup.  This patient's UHC coverage does not inclue the transportation benefit. Alan Schmidt [phone]

## 2019-03-30 ENCOUNTER — Telehealth: Payer: Self-pay | Admitting: Family Medicine

## 2019-03-30 ENCOUNTER — Other Ambulatory Visit: Payer: Self-pay | Admitting: Family Medicine

## 2019-03-30 ENCOUNTER — Other Ambulatory Visit: Payer: Self-pay | Admitting: *Deleted

## 2019-03-30 ENCOUNTER — Encounter: Payer: Self-pay | Admitting: *Deleted

## 2019-03-30 DIAGNOSIS — E1142 Type 2 diabetes mellitus with diabetic polyneuropathy: Secondary | ICD-10-CM

## 2019-03-30 DIAGNOSIS — E785 Hyperlipidemia, unspecified: Secondary | ICD-10-CM

## 2019-03-30 DIAGNOSIS — I10 Essential (primary) hypertension: Secondary | ICD-10-CM

## 2019-03-30 NOTE — Patient Outreach (Signed)
[facility]) Care Management [place] Telephone Outreach  03/30/2019  TASHON CAPP Jul 06, 1945 [id number]  [place] outreach attemptto [name], 74 y/o male referred to [place] Pharmacist embedded in patient's PCP officefor possible community resource needs in dealing with chronic depression. 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  Denies new/ recent falls, and reports baseline chronic pain- continues visiting with pain management provider regularly.  Patient sounds to be in no distress throughout phone call today.  Patient further reports:  -- no recent changes to medications; continues working with [place] team ([name]) embedded at PCP office- patient denies concerns around medications, and continues to self- manage medications, including insulin; verbalizes accurate understanding of dosing for insulin; patient reports today that he is not struggling with depression; reports that he believes this is "better."  Self-health management ofchronic pain and DM: -- using teach back method, continued reviewing with patient previously provided printed educational material around dietary strategies for self-health management of chronic disease state of DM- patient reports has made numerous lasting dietary changes, reiterated previously provided education from last outreach and reinforced patient's efforts to make lasting changes; discussed ongoing education around diet management of DM and carbohydrate counting/ diet strategies  -- reviewed with patient recently  recorded blood sugars from CGM: patient reports great numbers from reading from his data and states his [employer] monitor graph indicated that his 90-day average is 128-- positive reinforcement provided- this is a noticeable decrease in his overall numbers; discussed with patient how his recorded blood sugars at home correlate to his A1-C values; patient acknowledges that he has an upcoming PCP provider appointment (virtual) and we discussed benefit of him requesting to have A1-C value included in visit, as his last recorded A1-C was in September 2020 (8.5); patient reports he will contact his care provider and request this value be drawn prior to his scheduled virtual office visit ---- 7 day average: 127 ---- 14 and 30-day averages: 126 ---- denies recent significantly low/ high isolated values at home: reports highest at "206" after eating and lowest at "68" fasting ---- reports fasting blood sugar this morning of "83" and blood sugar after lunch of "192;" discussed blood sugar goals for both fasting and post-prandial values ---- reiterated previously provided education around carbohydrate recommendations for each day/ meal, and discussed importance of including fiber with carbohydrates to maximumize sugar metabolism: will place printed educational material in mail to patient for his ongoing review  Patient denies further issues, concerns, or problems today. Iconfirmed patient hasmy direct phone number, the main [place] office phone number, and the [facility] CM 24-hour nurse advice phone number should issues arise prior to next scheduled [place] outreach next month. Encouraged patient to contact me directly if needs, questions, issues, or concerns arise prior to next scheduled outreach; patient agreed to do so.  Plan:  Patient will take medications as prescribed and will attend all scheduled provider appointments  Patient will promptly notify care providers for any new concerns/  issues/ problems that arise  Patient will continue monitoring/ recordingblood sugars at home using [name] CGM system  Patient will  maintain engagement with [facility] Pharmacist embedded at his PCP office  I will place printed educational material in mail to patient around ongoing education for dietary management of chronic disease state of DM  [facility] CM outreach to continue with scheduled phone call nextmonth  [facility] CM Care Plan Problem One     Most Recent Value  Care Plan Problem One  Self-health management of chronic disease state of Diabetes in patient with depression as evidenced by patient reporting  Role Documenting the Problem One  Care Management [place] for Problem One  Active  [facility] Long Term Goal   Over the next 60 days, patient will verbalize 3 dietary strategies for blood sugar management as evidenced by patient reporting during [facility] RN CM outreach [Goal re-established around ongoing education]  [place] Term Goal Start Date  03/30/19  Interventions for Problem One Long Term Goal  Confirmed that patient has initiated several changes to his diet based on previously provided education,  positive reinforcement provided,  provided additional education around specifics of carb-counting and dietary choices with good carbohydrates,  placed additional educational material in mail to patient around carbohydrate intake in setting of DM  [facility] CM Short Term Goal #1   Over the next 30 days, patient will continue monitoring and recording blood sugars at home as evdienced by patient reporting and review of same during [facility] RN CM outreach  [facility] CM Short Term Goal #1 Start Date  02/24/19  [facility] CM Short Term Goal #1 Met Date  03/30/19 Goal Met  Interventions for Short Term Goal #1  Reviewed with patient recent data from his CGM,  discussed correlation of blood sugars recorded at home with A1-C values,  encouraged patient to contact his PCP to request blood work for current A1-C  prior to next scheduled office next month,  placed printed educational material in mail to patient around correlation of A1-C to blood sugar readings at home  [facility] CM Short Term Goal #2   Over the next 21 days, patient will discuss his depression and community resource needs with [facility] CSW, as evidenced by patient reporting and collaboration with [facility] CSW as indicated during [facility] RN CM outreach  [facility] CM Short Term Goal #2 Start Date  02/24/19  [facility] CM Short Term Goal #2 Met Date  03/30/19 [Goal partially met]  Interventions for Short Term Goal #2  Confirmed that patient believes that his depression has improved,  confirmed that he denies current problems around ongoing depression and that he believes this is being managed well.     Oneta Rack, RN, BSN, [employer] [facility] Care Management  [phone]

## 2019-03-30 NOTE — Telephone Encounter (Signed)
Patient is requesting labs and A1c. No orders are in patient's Mychart.

## 2019-03-30 NOTE — Telephone Encounter (Signed)
Refill request for Xanax, last seen 01-12-19, last filled 02-21-19.  Please advise.

## 2019-03-30 NOTE — Telephone Encounter (Signed)
This patient is requesting labs and A1C.  Adelise Buswell,cma

## 2019-03-31 ENCOUNTER — Telehealth: Payer: Self-pay

## 2019-03-31 NOTE — Telephone Encounter (Signed)
I called and left a message for the patient to call and schedule a lab appt. Jesus Poplin,cma

## 2019-03-31 NOTE — Telephone Encounter (Signed)
03/31/2019 Left message on voicemail for patient to return my call regarding Medicaid Transportation. Ambrose Mantle [phone]

## 2019-03-31 NOTE — Telephone Encounter (Signed)
Ordered

## 2019-04-03 ENCOUNTER — Ambulatory Visit: Payer: Self-pay | Admitting: Pharmacist

## 2019-04-03 ENCOUNTER — Telehealth: Payer: Self-pay | Admitting: *Deleted

## 2019-04-03 NOTE — Patient Outreach (Signed)
[facility]) Care Management  04/03/2019  Alan Schmidt 03/23/1945 [id number]   CSW made an initial attempt to try and contact patient today to perform phone assessment, as well as assess and assist with social needs and services, without success. A HIPPA compliant messages was left for patient on voicemail on both home ph#: [phone] & cell#: [phone]. CSW is currently awaiting a return call. CSW will make a second outreach attempt within the next 3-4 days, if CSW does not receive a return call from patient in the meantime.    Raynaldo Opitz, LCSW [facility]  Clinical Social Worker cell #: [phone]

## 2019-04-03 NOTE — Chronic Care Management (AMB) (Signed)
  Chronic Care Management   Note  04/03/2019 Name: Alan Schmidt MRN: [id number] DOB: [birth year]  Alan Schmidt is a 74 y.o. year old male who is a primary care patient of [name], [name], MD. The CCM team was consulted for assistance with chronic disease management and care coordination needs.    Patient called, left a message that he placed a new sensor this weekend using the [place] app on his phone, rather than the reader, so that the readings could be transmitted remotely to [place]. He couldn't get the reader to upload to his computer. He noted this was just an [place] message if everything was working well.   Readings are now apparent on LibreView website.   Follow up plan: - Will outreach as previously scheduled  [name], PharmD, [place], [place] Pharmacist [place] [phone]

## 2019-04-04 NOTE — Telephone Encounter (Signed)
Can you call and schedule a lab appt for the patient I could not reach him.  Yandriel Boening,cma

## 2019-04-05 DIAGNOSIS — Z794 Long term (current) use of insulin: Secondary | ICD-10-CM | POA: Diagnosis not present

## 2019-04-05 DIAGNOSIS — E118 Type 2 diabetes mellitus with unspecified complications: Secondary | ICD-10-CM | POA: Diagnosis not present

## 2019-04-06 ENCOUNTER — Ambulatory Visit: Payer: Self-pay | Admitting: *Deleted

## 2019-04-06 ENCOUNTER — Other Ambulatory Visit: Payer: Self-pay | Admitting: Family Medicine

## 2019-04-06 ENCOUNTER — Telehealth: Payer: Self-pay

## 2019-04-06 NOTE — Telephone Encounter (Signed)
04/06/2019 Left message on voicemail for patient to return my call regarding Medicaid Transportation. Ambrose Mantle [phone]

## 2019-04-20 ENCOUNTER — Other Ambulatory Visit: Payer: Self-pay

## 2019-04-20 NOTE — Patient Outreach (Signed)
[facility]) Care Management  04/20/2019  Alan Schmidt 05-17-1945 [id number]   Medication Adherence call to Alan Schmidt Compliant Voice message left with a call back number. Alan Schmidt is showing past due on Pravastatin 40 mg and Olmesartan 40 mg under [place].   [place] Management Direct Dial [phone]  Fax [phone] [name]

## 2019-04-28 ENCOUNTER — Ambulatory Visit: Payer: Medicare Other | Admitting: Family Medicine

## 2019-05-02 ENCOUNTER — Other Ambulatory Visit: Payer: Self-pay | Admitting: Family Medicine

## 2019-05-02 NOTE — Telephone Encounter (Signed)
Refill request for Xanax, last seen 01-12-19 , last filled 03-30-19.  Please advise.

## 2019-05-06 ENCOUNTER — Other Ambulatory Visit: Payer: Self-pay | Admitting: Family Medicine

## 2019-05-06 DIAGNOSIS — Z794 Long term (current) use of insulin: Secondary | ICD-10-CM | POA: Diagnosis not present

## 2019-05-06 DIAGNOSIS — E118 Type 2 diabetes mellitus with unspecified complications: Secondary | ICD-10-CM | POA: Diagnosis not present

## 2019-05-08 ENCOUNTER — Ambulatory Visit: Payer: Self-pay | Admitting: Pharmacist

## 2019-05-08 ENCOUNTER — Telehealth: Payer: Medicare Other

## 2019-05-08 NOTE — Chronic Care Management (AMB) (Signed)
  Chronic Care Management   Note  05/08/2019 Name: Alan Schmidt MRN: [id number] DOB: 1945-09-06  Alan Schmidt is a 74 y.o. year old male who is a primary care patient of [name], [name], MD. The CCM team was consulted for assistance with chronic disease management and care coordination needs.    Attempted to contact patient for medication management review. Left HIPAA compliant message for patient to return my call at their convenience.   Plan: - Will collaborate with Care Guide to outreach to schedule follow up with me  [name], PharmD, [place], [place] Pharmacist [place] [phone]

## 2019-05-09 ENCOUNTER — Other Ambulatory Visit: Payer: Self-pay

## 2019-05-09 ENCOUNTER — Encounter: Payer: Self-pay | Admitting: Pain Medicine

## 2019-05-09 ENCOUNTER — Ambulatory Visit
Admission: RE | Admit: 2019-05-09 | Discharge: 2019-05-09 | Disposition: A | Discharge status: Home / Self Care | Payer: Medicare Other | Source: Ambulatory Visit | Attending: Pain Medicine | Admitting: Pain Medicine

## 2019-05-09 ENCOUNTER — Telehealth: Payer: Self-pay | Admitting: Family Medicine

## 2019-05-09 ENCOUNTER — Ambulatory Visit (HOSPITAL_BASED_OUTPATIENT_CLINIC_OR_DEPARTMENT_OTHER): Payer: Medicare Other | Admitting: Pain Medicine

## 2019-05-09 VITALS — BP 134/73 | HR 86 | Temp 98.0°F | Resp 17 | Ht 73.0 in | Wt 250.0 lb

## 2019-05-09 DIAGNOSIS — M5441 Lumbago with sciatica, right side: Secondary | ICD-10-CM

## 2019-05-09 DIAGNOSIS — Z91041 Radiographic dye allergy status: Secondary | ICD-10-CM | POA: Insufficient documentation

## 2019-05-09 DIAGNOSIS — T402X5A Adverse effect of other opioids, initial encounter: Secondary | ICD-10-CM

## 2019-05-09 DIAGNOSIS — R55 Syncope and collapse: Secondary | ICD-10-CM | POA: Diagnosis not present

## 2019-05-09 DIAGNOSIS — M5136 Other intervertebral disc degeneration, lumbar region: Secondary | ICD-10-CM

## 2019-05-09 DIAGNOSIS — M47817 Spondylosis without myelopathy or radiculopathy, lumbosacral region: Secondary | ICD-10-CM

## 2019-05-09 DIAGNOSIS — K5903 Drug induced constipation: Secondary | ICD-10-CM | POA: Diagnosis not present

## 2019-05-09 DIAGNOSIS — Z91013 Allergy to seafood: Secondary | ICD-10-CM | POA: Insufficient documentation

## 2019-05-09 DIAGNOSIS — F1190 Opioid use, unspecified, uncomplicated: Secondary | ICD-10-CM | POA: Insufficient documentation

## 2019-05-09 DIAGNOSIS — G894 Chronic pain syndrome: Secondary | ICD-10-CM | POA: Diagnosis not present

## 2019-05-09 DIAGNOSIS — M47816 Spondylosis without myelopathy or radiculopathy, lumbar region: Secondary | ICD-10-CM | POA: Diagnosis not present

## 2019-05-09 DIAGNOSIS — G8929 Other chronic pain: Secondary | ICD-10-CM | POA: Diagnosis not present

## 2019-05-09 DIAGNOSIS — M5442 Lumbago with sciatica, left side: Secondary | ICD-10-CM | POA: Diagnosis not present

## 2019-05-09 DIAGNOSIS — G8918 Other acute postprocedural pain: Secondary | ICD-10-CM | POA: Diagnosis not present

## 2019-05-09 DIAGNOSIS — M51369 Other intervertebral disc degeneration, lumbar region without mention of lumbar back pain or lower extremity pain: Secondary | ICD-10-CM

## 2019-05-09 MED ORDER — FENTANYL CITRATE (PF) 100 MCG/2ML IJ SOLN
25.0000 ug | INTRAMUSCULAR | Status: DC | PRN
  Administered 2019-05-09: 50 ug via INTRAVENOUS
  Filled 2019-05-09: qty 2

## 2019-05-09 MED ORDER — OXYCODONE HCL 10 MG PO TABS: 10.0000 mg | ORAL_TABLET | Freq: Four times a day (QID) | ORAL | 0 refills | Status: DC | PRN

## 2019-05-09 MED ORDER — NALOXONE HCL 2 MG/2ML IJ SOSY: 1.0000 mg | PREFILLED_SYRINGE | INTRAMUSCULAR | 1 refills | Status: DC | PRN

## 2019-05-09 MED ORDER — LACTATED RINGERS IV SOLN
1000.0000 mL | Freq: Once | INTRAVENOUS | Status: AC
  Administered 2019-05-09: 1000 mL via INTRAVENOUS

## 2019-05-09 MED ORDER — HYDROCODONE-ACETAMINOPHEN 5-325 MG PO TABS: 1.0000 | ORAL_TABLET | Freq: Three times a day (TID) | ORAL | 0 refills | Status: AC | PRN

## 2019-05-09 MED ORDER — LIDOCAINE HCL 2 % IJ SOLN
20.0000 mL | Freq: Once | INTRAMUSCULAR | Status: AC
  Administered 2019-05-09: 400 mg
  Filled 2019-05-09: qty 40

## 2019-05-09 MED ORDER — ROPIVACAINE HCL 2 MG/ML IJ SOLN
9.0000 mL | Freq: Once | INTRAMUSCULAR | Status: AC
  Administered 2019-05-09: 9 mL via PERINEURAL
  Filled 2019-05-09: qty 10

## 2019-05-09 MED ORDER — LUBIPROSTONE 8 MCG PO CAPS: 8.0000 ug | ORAL_CAPSULE | Freq: Two times a day (BID) | ORAL | 5 refills | Status: DC

## 2019-05-09 MED ORDER — TRIAMCINOLONE ACETONIDE 40 MG/ML IJ SUSP
40.0000 mg | Freq: Once | INTRAMUSCULAR | Status: AC
  Administered 2019-05-09: 40 mg
  Filled 2019-05-09: qty 1

## 2019-05-09 MED ORDER — MIDAZOLAM HCL 5 MG/5ML IJ SOLN
1.0000 mg | INTRAMUSCULAR | Status: DC | PRN
  Administered 2019-05-09: 1 mg via INTRAVENOUS
  Filled 2019-05-09: qty 5

## 2019-05-09 MED ORDER — GLYCOPYRROLATE 0.2 MG/ML IJ SOLN
0.2000 mg | Freq: Once | INTRAMUSCULAR | Status: AC
  Administered 2019-05-09: 0.2 mg via INTRAVENOUS
  Filled 2019-05-09: qty 1

## 2019-05-09 NOTE — Progress Notes (Signed)
Safety precautions to be maintained throughout the outpatient stay will include: orient to surroundings, keep bed in low position, maintain call bell within reach at all times, provide assistance with transfer out of bed and ambulation.  

## 2019-05-09 NOTE — Patient Instructions (Signed)

## 2019-05-09 NOTE — Chronic Care Management (AMB) (Signed)
°  Care Management   Note  05/09/2019 Name: Alan Schmidt MRN: [id number] DOB: 02-17-1945  Alan Schmidt is a 74 y.o. year old male who is a primary care patient of [name], MD and is actively engaged with the care management team. I reached out to [name] by phone today to assist with re-scheduling a follow up visit with the Pharmacist  Follow up plan: Unsuccessful telephone outreach attempt made. A HIPPA compliant phone message was left for the patient providing contact information and requesting a return call.  The care management team will reach out to the patient again over the next 7 days.  If patient returns call to provider office, please advise to call [place]  at [place], [place], [place], [place] [address] Direct Dial: [phone] [email or web address] Website: [email or web address]

## 2019-05-09 NOTE — Progress Notes (Signed)
PROVIDER NOTE: Information contained herein reflects review and annotations entered in association with encounter. Interpretation of such information and data should be left to medically-trained personnel. Information provided to patient can be located elsewhere in the medical record under "Patient Instructions". Document created using STT-dictation technology, any transcriptional errors that may result from process are unintentional.    Patient: Alan Schmidt  Service Category: Procedure  Provider: Gaspar Cola, MD  DOB: 05/26/1945  DOS: 05/09/2019  Location: [facility]  MRN: [id number]  Setting: Ambulatory - outpatient  Referring Provider: Milinda Pointer, MD  Type: Established Patient  Specialty: Interventional Pain Management  PCP: [name], MD   Primary Reason for Visit: Interventional Pain Management Treatment. CC: Back Pain (lower)  Procedure:          Anesthesia, Analgesia, Anxiolysis:  Type: Thermal Lumbar Facet, Medial Branch Radiofrequency Ablation/Neurotomy  #2  Primary Purpose: Therapeutic Region: Posterolateral Lumbosacral Spine Level: L2, L3, L4, L5, & S1 Medial Branch Level(s). These levels will denervate the L3-4, L4-5, and the L5-S1 lumbar facet joints. Laterality: Left  Type: Moderate (Conscious) Sedation combined with Local Anesthesia Indication(s): Analgesia and Anxiety Route: Intravenous (IV) IV Access: Secured Sedation: Meaningful verbal contact was maintained at all times during the procedure  Local Anesthetic: Lidocaine 1-2%  Position: Prone   Indications: 1. Lumbar facet syndrome (Bilateral) (R>L)   2. Spondylosis without myelopathy or radiculopathy, lumbosacral region   3. Lumbar facet joint osteoarthritis (Bilateral)   4. DDD (degenerative disc disease), lumbar   5. Lumbar spondylosis   6. Chronic low back pain (Primary Source of Pain) (Bilateral) (R>L)    History of allergy to radiographic contrast media    History of  allergy to shellfish    History of Vasovagal response to spinal injections    Alan Schmidt has been dealing with the above chronic pain for longer than three months and has either failed to respond, was unable to tolerate, or simply did not get enough benefit from other more conservative therapies including, but not limited to: 1. Over-the-counter medications 2. Anti-inflammatory medications 3. Muscle relaxants 4. Membrane stabilizers 5. Opioids 6. Physical therapy and/or chiropractic manipulation 7. Modalities (Heat, ice, etc.) 8. Invasive techniques such as nerve blocks. Alan Schmidt has attained more than 50% relief of the pain from a series of diagnostic injections conducted in separate occasions.  Pain Score: Pre-procedure: 4 /10 Post-procedure: 0-No pain/10  Pre-op Assessment:  Alan Schmidt is a 74 y.o. (year old), male patient, seen today for interventional treatment. He  has a past surgical history that includes Total hip arthroplasty; Tonsillectomy; and right hip surgery. Alan Schmidt has a current medication list which includes the following prescription(s): acetaminophen, alprazolam, amlodipine, aspirin ec, bd pen needle nano u/f, carvedilol, freestyle libre 14 day reader, freestyle libre 14 day sensor, desoximetasone, escitalopram, hydrochlorothiazide, insulin lispro, jardiance, loratadine, meloxicam, metformin, mometasone, olmesartan, pantoprazole, pravastatin, sennosides-docusate sodium, tamsulosin, tresiba flextouch, victoza, benefiber, hydrocodone-acetaminophen, [START ON 05/16/2019] hydrocodone-acetaminophen, [START ON 05/30/2019] lubiprostone, naloxone, [START ON 05/30/2019] oxycodone hcl, [START ON 06/29/2019] oxycodone hcl, and [START ON 07/29/2019] oxycodone hcl, and the following Facility-Administered Medications: fentanyl and midazolam. His primarily concern today is the Back Pain (lower)  Initial Vital Signs:  Pulse/HCG Rate: 86ECG Heart Rate: 81 Temp: (!) 97.2 F (36.2  C) Resp: (!) 95 BP: (!) 145/96 SpO2: 92 %  BMI: Estimated body mass index is 32.98 kg/m as calculated from the following:   Height as of this encounter: 6\' 1"  (1.854 m).  Weight as of this encounter: 250 lb (113.4 kg).  Risk Assessment: Allergies: Reviewed. He is allergic to contrast media [iodinated diagnostic agents]; iodine; and shellfish allergy.  Allergy Precautions: None required Coagulopathies: Reviewed. None identified.  Blood-thinner therapy: None at this time Active Infection(s): Reviewed. None identified. Alan Schmidt is afebrile  Site Confirmation: Alan Schmidt was asked to confirm the procedure and laterality before marking the site Procedure checklist: Completed Consent: Before the procedure and under the influence of no sedative(s), amnesic(s), or anxiolytics, the patient was informed of the treatment options, risks and possible complications. To fulfill our ethical and legal obligations, as recommended by the American Medical Association's Code of Ethics, I have informed the patient of my clinical impression; the nature and purpose of the treatment or procedure; the risks, benefits, and possible complications of the intervention; the alternatives, including doing nothing; the risk(s) and benefit(s) of the alternative treatment(s) or procedure(s); and the risk(s) and benefit(s) of doing nothing. The patient was provided information about the general risks and possible complications associated with the procedure. These may include, but are not limited to: failure to achieve desired goals, infection, bleeding, organ or nerve damage, allergic reactions, paralysis, and death. In addition, the patient was informed of those risks and complications associated to Spine-related procedures, such as failure to decrease pain; infection (i.e.: Meningitis, epidural or intraspinal abscess); bleeding (i.e.: epidural hematoma, subarachnoid hemorrhage, or any other type of intraspinal or  peri-dural bleeding); organ or nerve damage (i.e.: Any type of peripheral nerve, nerve root, or spinal cord injury) with subsequent damage to sensory, motor, and/or autonomic systems, resulting in permanent pain, numbness, and/or weakness of one or several areas of the body; allergic reactions; (i.e.: anaphylactic reaction); and/or death. Furthermore, the patient was informed of those risks and complications associated with the medications. These include, but are not limited to: allergic reactions (i.e.: anaphylactic or anaphylactoid reaction(s)); adrenal axis suppression; blood sugar elevation that in diabetics may result in ketoacidosis or comma; water retention that in patients with history of congestive heart failure may result in shortness of breath, pulmonary edema, and decompensation with resultant heart failure; weight gain; swelling or edema; medication-induced neural toxicity; particulate matter embolism and blood vessel occlusion with resultant organ, and/or nervous system infarction; and/or aseptic necrosis of one or more joints. Finally, the patient was informed that Medicine is not an exact science; therefore, there is also the possibility of unforeseen or unpredictable risks and/or possible complications that may result in a catastrophic outcome. The patient indicated having understood very clearly. We have given the patient no guarantees and we have made no promises. Enough time was given to the patient to ask questions, all of which were answered to the patient's satisfaction. Mr. Glomb has indicated that he wanted to continue with the procedure. Attestation: I, the ordering provider, attest that I have discussed with the patient the benefits, risks, side-effects, alternatives, likelihood of achieving goals, and potential problems during recovery for the procedure that I have provided informed consent. Date  Time: 05/09/2019 10:02 AM  Pre-Procedure Preparation:  Monitoring: As per clinic  protocol. Respiration, ETCO2, SpO2, BP, heart rate and rhythm monitor placed and checked for adequate function Safety Precautions: Patient was assessed for positional comfort and pressure points before starting the procedure. Time-out: I initiated and conducted the "Time-out" before starting the procedure, as per protocol. The patient was asked to participate by confirming the accuracy of the "Time Out" information. Verification of the correct person, site, and procedure were performed and  confirmed by me, the nursing staff, and the patient. "Time-out" conducted as per Joint Commission's Universal Protocol (UP.01.01.01). Time: 1111  Description of Procedure:          Laterality: Left Levels:  L2, L3, L4, L5, & S1 Medial Branch Level(s), at the L3-4, L4-5, and the L5-S1 lumbar facet joints. Area Prepped: Lumbosacral DuraPrep (Iodine Povacrylex [0.7% available iodine] and Isopropyl Alcohol, 74% w/w) Safety Precautions: Aspiration looking for blood return was conducted prior to all injections. At no point did we inject any substances, as a needle was being advanced. Before injecting, the patient was told to immediately notify me if he was experiencing any new onset of "ringing in the ears, or metallic taste in the mouth". No attempts were made at seeking any paresthesias. Safe injection practices and needle disposal techniques used. Medications properly checked for expiration dates. SDV (single dose vial) medications used. After the completion of the procedure, all disposable equipment used was discarded in the proper designated medical waste containers. Local Anesthesia: Protocol guidelines were followed. The patient was positioned over the fluoroscopy table. The area was prepped in the usual manner. The time-out was completed. The target area was identified using fluoroscopy. A 12-in long, straight, sterile hemostat was used with fluoroscopic guidance to locate the targets for each level blocked. Once  located, the skin was marked with an approved surgical skin marker. Once all sites were marked, the skin (epidermis, dermis, and hypodermis), as well as deeper tissues (fat, connective tissue and muscle) were infiltrated with a small amount of a short-acting local anesthetic, loaded on a 10cc syringe with a 25G, 1.5-in  Needle. An appropriate amount of time was allowed for local anesthetics to take effect before proceeding to the next step. Local Anesthetic: Lidocaine 2.0% The unused portion of the local anesthetic was discarded in the proper designated containers. Technical explanation of process:  Radiofrequency Ablation (RFA) L2 Medial Branch Nerve RFA: The target area for the L2 medial branch is at the junction of the postero-lateral aspect of the superior articular process and the superior, posterior, and medial edge of the transverse process of L3. Under fluoroscopic guidance, a Radiofrequency needle was inserted until contact was made with os over the superior postero-lateral aspect of the pedicular shadow (target area). Sensory and motor testing was conducted to properly adjust the position of the needle. Once satisfactory placement of the needle was achieved, the numbing solution was slowly injected after negative aspiration for blood. 2.0 mL of the nerve block solution was injected without difficulty or complication. After waiting for at least 3 minutes, the ablation was performed. Once completed, the needle was removed intact. L3 Medial Branch Nerve RFA: The target area for the L3 medial branch is at the junction of the postero-lateral aspect of the superior articular process and the superior, posterior, and medial edge of the transverse process of L4. Under fluoroscopic guidance, a Radiofrequency needle was inserted until contact was made with os over the superior postero-lateral aspect of the pedicular shadow (target area). Sensory and motor testing was conducted to properly adjust the position of  the needle. Once satisfactory placement of the needle was achieved, the numbing solution was slowly injected after negative aspiration for blood. 2.0 mL of the nerve block solution was injected without difficulty or complication. After waiting for at least 3 minutes, the ablation was performed. Once completed, the needle was removed intact. L4 Medial Branch Nerve RFA: The target area for the L4 medial branch is at the junction of  the postero-lateral aspect of the superior articular process and the superior, posterior, and medial edge of the transverse process of L5. Under fluoroscopic guidance, a Radiofrequency needle was inserted until contact was made with os over the superior postero-lateral aspect of the pedicular shadow (target area). Sensory and motor testing was conducted to properly adjust the position of the needle. Once satisfactory placement of the needle was achieved, the numbing solution was slowly injected after negative aspiration for blood. 2.0 mL of the nerve block solution was injected without difficulty or complication. After waiting for at least 3 minutes, the ablation was performed. Once completed, the needle was removed intact. L5 Medial Branch Nerve RFA: The target area for the L5 medial branch is at the junction of the postero-lateral aspect of the superior articular process of S1 and the superior, posterior, and medial edge of the sacral ala. Under fluoroscopic guidance, a Radiofrequency needle was inserted until contact was made with os over the superior postero-lateral aspect of the pedicular shadow (target area). Sensory and motor testing was conducted to properly adjust the position of the needle. Once satisfactory placement of the needle was achieved, the numbing solution was slowly injected after negative aspiration for blood. 2.0 mL of the nerve block solution was injected without difficulty or complication. After waiting for at least 3 minutes, the ablation was performed. Once  completed, the needle was removed intact. S1 Medial Branch Nerve RFA: The target area for the S1 medial branch is located inferior to the junction of the S1 superior articular process and the L5 inferior articular process, posterior, inferior, and lateral to the 6 o'clock position of the L5-S1 facet joint, just superior to the S1 posterior foramen. Under fluoroscopic guidance, the Radiofrequency needle was advanced until contact was made with os over the Target area. Sensory and motor testing was conducted to properly adjust the position of the needle. Once satisfactory placement of the needle was achieved, the numbing solution was slowly injected after negative aspiration for blood. 2.0 mL of the nerve block solution was injected without difficulty or complication. After waiting for at least 3 minutes, the ablation was performed. Once completed, the needle was removed intact. Radiofrequency lesioning (ablation):  Radiofrequency Generator: NeuroTherm [id number] Sensory Stimulation Parameters: 50 Hz was used to locate & identify the nerve, making sure that the needle was positioned such that there was no sensory stimulation below 0.3 V or above 0.7 V. Motor Stimulation Parameters: 2 Hz was used to evaluate the motor component. Care was taken not to lesion any nerves that demonstrated motor stimulation of the lower extremities at an output of less than 2.5 times that of the sensory threshold, or a maximum of 2.0 V. Lesioning Technique Parameters: Standard Radiofrequency settings. (Not bipolar or pulsed.) Temperature Settings: 80 degrees C Lesioning time: 60 seconds Intra-operative Compliance: Compliant Materials & Medications: Needle(s) (Electrode/Cannula) Type: Teflon-coated, curved tip, Radiofrequency needle(s) Gauge: 22G Length: 10cm Numbing solution: 0.2% PF-Ropivacaine + Triamcinolone (40 mg/mL) diluted to a final concentration of 4 mg of Triamcinolone/mL of Ropivacaine The unused portion of the  solution was discarded in the proper designated containers.  Once the entire procedure was completed, the treated area was cleaned, making sure to leave some of the prepping solution back to take advantage of its long term bactericidal properties.  Illustration of the posterior view of the lumbar spine and the posterior neural structures. Laminae of L2 through S1 are labeled. DPRL5, dorsal primary ramus of L5; DPRS1, dorsal primary ramus of S1; DPR3, dorsal  primary ramus of L3; FJ, facet (zygapophyseal) joint L3-L4; I, inferior articular process of L4; LB1, lateral branch of dorsal primary ramus of L1; IAB, inferior articular branches from L3 medial branch (supplies L4-L5 facet joint); IBP, intermediate branch plexus; MB3, medial branch of dorsal primary ramus of L3; NR3, third lumbar nerve root; S, superior articular process of L5; SAB, superior articular branches from L4 (supplies L4-5 facet joint also); TP3, transverse process of L3.  Vitals:   05/09/19 1148 05/09/19 1158 05/09/19 1208 05/09/19 1218  BP: 120/85 132/78 135/78 134/73  Pulse:      Resp: 16 14 16 17   Temp:  (!) 97.5 F (36.4 C)  98 F (36.7 C)  TempSrc:      SpO2: 94% 95% 96% 96%  Weight:      Height:       Start Time: 1111 hrs. End Time: 1148 hrs.  Imaging Guidance (Spinal):          Type of Imaging Technique: Fluoroscopy Guidance (Spinal) Indication(s): Assistance in needle guidance and placement for procedures requiring needle placement in or near specific anatomical locations not easily accessible without such assistance. Exposure Time: Please see nurses notes. Contrast: None used. Fluoroscopic Guidance: I was personally present during the use of fluoroscopy. "Tunnel Vision Technique" used to obtain the best possible view of the target area. Parallax error corrected before commencing the procedure. "Direction-depth-direction" technique used to introduce the needle under continuous pulsed fluoroscopy. Once target was  reached, antero-posterior, oblique, and lateral fluoroscopic projection used confirm needle placement in all planes. Images permanently stored in EMR. Interpretation: No contrast injected. I personally interpreted the imaging intraoperatively. Adequate needle placement confirmed in multiple planes. Permanent images saved into the patient's record.  Antibiotic Prophylaxis:   Anti-infectives (From admission, onward)   None     Indication(s): None identified  Post-operative Assessment:  Post-procedure Vital Signs:  Pulse/HCG Rate: 8674 Temp: 98 F (36.7 C) Resp: 17 BP: 134/73 SpO2: 96 %  EBL: None  Complications: No immediate post-treatment complications observed by team, or reported by patient.  Note: The patient tolerated the entire procedure well. A repeat set of vitals were taken after the procedure and the patient was kept under observation following institutional policy, for this type of procedure. Post-procedural neurological assessment was performed, showing return to baseline, prior to discharge. The patient was provided with post-procedure discharge instructions, including a section on how to identify potential problems. Should any problems arise concerning this procedure, the patient was given instructions to immediately contact us, at any time, without hesitation. In any case, we plan to contact the patient by telephone for a follow-up status report regarding this interventional procedure.  Comments:  No additional relevant information.  Plan of Care  Orders:  Orders Placed This Encounter  Procedures  . Radiofrequency,Lumbar    Scheduling Instructions:     Side(s): Left-sided     Level: L3-4, L4-5, & L5-S1 Facets (L2, L3, L4, L5, & S1 Medial Branch Nerves)     Sedation: With Sedation     Timeframe: Today    Order Specific Question:   Where will this procedure be performed?    Answer:   [facility]  . DG [facility] C-ARM 1-60 MIN NO REPORT    Intraoperative  interpretation by procedural physician at [place].    Standing Status:   Standing    Number of Occurrences:   1    Order Specific Question:   Reason for exam:  Answer:   Assistance in needle guidance and placement for procedures requiring needle placement in or near specific anatomical locations not easily accessible without such assistance.  . Informed Consent Details: Physician/Practitioner Attestation; Transcribe to consent form and obtain patient signature    Nursing Order: Transcribe to consent form and obtain patient signature. Note: Always confirm laterality of pain with Mr. Grandville Silos, before procedure. Procedure: Lumbar Facet Radiofrequency Ablation Indication/Reason: Low Back Pain, with our without leg pain, due to Facet Joint Arthralgia (Joint Pain) known as Lumbar Facet Syndrome, secondary to Lumbar, and/or Lumbosacral Spondylosis (Arthritis of the Spine), without myelopathy or radiculopathy (Nerve Damage). Provider Attestation: I, [place] Dossie Arbour, MD, (Pain Management Specialist), the physician/practitioner, attest that I have discussed with the patient the benefits, risks, side effects, alternatives, likelihood of achieving goals and potential problems during recovery for the procedure that I have provided informed consent.  . Provide equipment / supplies at bedside    Equipment required: Sterile "Radiofrequency Tray"; Large hemostat (1); Small hemostat (1); Towels (6-8); 4x4 sterile sponge pack (1) Radiofrequency Needle(s): Size: Long Quantity: 5    Standing Status:   Standing    Number of Occurrences:   1    Order Specific Question:   Specify    Answer:   Radiofrequency Tray  . Miscellanous precautions    Standing Status:   Standing    Number of Occurrences:   1  . Miscellanous precautions    NOTE: Although It is true that patients can have allergies to shellfish and that shellfish contain iodine, most shellfish  allergies are due to two protein allergens  present in the shellfish: tropomyosins and parvalbumin. Not all patients with shellfish allergies are allergic to iodine. However, as a precaution, avoid using iodine containing products.    Standing Status:   Standing    Number of Occurrences:   1   Chronic Opioid Analgesic:  Oxycodone IR 10 mg every 6 hours (40 mg/day) MME/day:60 mg/day.   Medications ordered for procedure: Meds ordered this encounter  Medications  . lidocaine (XYLOCAINE) 2 % (with pres) injection 400 mg  . lactated ringers infusion 1,000 mL  . midazolam (VERSED) 5 MG/5ML injection 1-2 mg    Make sure Flumazenil is available in the pyxis when using this medication. If oversedation occurs, administer 0.2 mg IV over 15 sec. If after 45 sec no response, administer 0.2 mg again over 1 min; may repeat at 1 min intervals; not to exceed 4 doses (1 mg)  . fentaNYL (SUBLIMAZE) injection 25-50 mcg    Make sure Narcan is available in the pyxis when using this medication. In the event of respiratory depression (RR< 8/min): Titrate NARCAN (naloxone) in increments of 0.1 to 0.2 mg IV at 2-3 minute intervals, until desired degree of reversal.  . glycopyrrolate (ROBINUL) injection 0.2 mg  . ropivacaine (PF) 2 mg/mL (0.2%) (NAROPIN) injection 9 mL  . triamcinolone acetonide (KENALOG-40) injection 40 mg  . HYDROcodone-acetaminophen (NORCO/VICODIN) 5-325 MG tablet    Sig: Take 1 tablet by mouth every 8 (eight) hours as needed for up to 7 days for severe pain. Must last 7 days.    Dispense:  21 tablet    Refill:  0    For acute post-operative pain. Not to be refilled. Must last 7 days.  Marland Kitchen HYDROcodone-acetaminophen (NORCO/VICODIN) 5-325 MG tablet    Sig: Take 1 tablet by mouth every 8 (eight) hours as needed for up to 7 days for severe pain. Must last for 7 days.  Dispense:  21 tablet    Refill:  0    For acute post-operative pain. Not to be refilled. Must last 7 days.  . Oxycodone HCl 10 MG TABS    Sig: Take 1 tablet (10 mg total) by  mouth every 6 (six) hours as needed. Must last 30 days    Dispense:  120 tablet    Refill:  0    Chronic Pain: STOP Act (Not applicable) Fill 1 day early if closed on refill date. Do not fill until: 05/30/2019. To last until: 06/29/2019. Avoid benzodiazepines within 8 hours of opioids  . Oxycodone HCl 10 MG TABS    Sig: Take 1 tablet (10 mg total) by mouth every 6 (six) hours as needed. Must last 30 days    Dispense:  120 tablet    Refill:  0    Chronic Pain: STOP Act (Not applicable) Fill 1 day early if closed on refill date. Do not fill until: 06/29/2019. To last until: 07/29/2019. Avoid benzodiazepines within 8 hours of opioids  . Oxycodone HCl 10 MG TABS    Sig: Take 1 tablet (10 mg total) by mouth every 6 (six) hours as needed. Must last 30 days    Dispense:  120 tablet    Refill:  0    Chronic Pain: STOP Act (Not applicable) Fill 1 day early if closed on refill date. Do not fill until: 07/29/2019. To last until: 08/28/2019. Avoid benzodiazepines within 8 hours of opioids  . naloxone (NARCAN) 2 MG/2ML injection    Sig: Inject 1 mL (1 mg total) into the muscle as needed for up to 2 doses (for emergency use only). Always have available. Inject into thigh muscle and call 911    Dispense:  2 mL    Refill:  1    Please instruct patient on emergency use of medication for opioid overdose.  . lubiprostone (AMITIZA) 8 MCG capsule    Sig: Take 1 capsule (8 mcg total) by mouth 2 (two) times daily with a meal. Swallow the medication whole. Do not break or chew the medication.    Dispense:  60 capsule    Refill:  5    Fill one day early if pharmacy is closed on scheduled refill date. May substitute for generic if available.   Medications administered: We administered lidocaine, lactated ringers, midazolam, fentaNYL, glycopyrrolate, ropivacaine (PF) 2 mg/mL (0.2%), and triamcinolone acetonide.  See the medical record for exact dosing, route, and time of administration.  Follow-up plan:   Return in  about 6 weeks (around 06/20/2019) for (VV), (PP).       Interventional management options: Planned, scheduled, and/or pending:   Therapeutic caudal epidural steroid injection under fluoroscopic guidance and IV sedation   Considering:   Diagnostic Left GONB  Possible Left GON RFA    Palliative PRN treatment(s):   Palliative right lumbar facet block #3  Palliative left lumbar facet block #3  Therapeutic left cervical ESI #2  Palliative left lumbar facet RFA #2 (last done on 02/16/2017) Palliative right lumbar facet RFA #6 (last done on 12/27/2018)    Recent Visits No visits were found meeting these conditions.  Showing recent visits within past 90 days and meeting all other requirements   Today's Visits Date Type Provider Dept  05/09/19 Procedure visit Milinda Pointer, MD [facility]  Showing today's visits and meeting all other requirements   Future Appointments Date Type Provider Dept  05/29/19 Appointment Milinda Pointer, [facility]  06/20/19  Appointment Milinda Pointer, MD [facility]  Showing future appointments within next 90 days and meeting all other requirements   Disposition: Discharge home  Discharge (Date  Time): 05/09/2019; 1223 hrs.   Primary Care Physician: [name], MD Location: [facility] Facility Note by: Gaspar Cola, MD Date: 05/09/2019; Time: 1:49 PM  Disclaimer:  Medicine is not an Chief Strategy Officer. The only guarantee in medicine is that nothing is guaranteed. It is important to note that the decision to proceed with this intervention was based on the information collected from the patient. The Data and conclusions were drawn from the patient's questionnaire, the interview, and the physical examination. Because the information was provided in large part by the patient, it cannot be guaranteed that it has not been purposely or unconsciously manipulated. Every effort has been made to  obtain as much relevant data as possible for this evaluation. It is important to note that the conclusions that lead to this procedure are derived in large part from the available data. Always take into account that the treatment will also be dependent on availability of resources and existing treatment guidelines, considered by other Pain Management Practitioners as being common knowledge and practice, at the time of the intervention. For Medico-Legal purposes, it is also important to point out that variation in procedural techniques and pharmacological choices are the acceptable norm. The indications, contraindications, technique, and results of the above procedure should only be interpreted and judged by a Board-Certified Interventional Pain Specialist with extensive familiarity and expertise in the same exact procedure and technique.

## 2019-05-10 ENCOUNTER — Encounter: Payer: Self-pay | Admitting: *Deleted

## 2019-05-10 ENCOUNTER — Other Ambulatory Visit: Payer: Self-pay | Admitting: *Deleted

## 2019-05-10 ENCOUNTER — Telehealth: Payer: Self-pay | Admitting: *Deleted

## 2019-05-10 NOTE — Patient Outreach (Signed)
[facility]) Care Management [place] Telephone Outreach Unsuccessful (consecutive) outreach attempt # 1- previously engaged patient  05/10/2019  Alan Schmidt 08-05-1945 [id number]  Unsuccessfultelephone outreach attemptto [name], 74 y/o male referred to [facility] Pharmacist embedded in patient's PCP officefor possible community resource needs in dealing with chronic depression. 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  HIPAA compliant voice mail message left for patient, requesting return call back.  Plan:  Will re-attempt [place] telephone outreach within 4 business days if I do not hear back from patient first  Oneta Rack, RN, BSN, [employer] Coordinator [facility] Care Management  [phone]

## 2019-05-10 NOTE — Telephone Encounter (Signed)
No problems post procedure. 

## 2019-05-11 NOTE — Chronic Care Management (AMB) (Signed)
  Care Management   Note  05/11/2019 Name: Alan Schmidt MRN: [id number] DOB: [birth year]  Alan Schmidt is a 74 y.o. year old male who is a primary care patient of [name], MD and is actively engaged with the care management team. I reached out to [name] by phone today to assist with re-scheduling a follow up visit with the Pharmacist  Follow up plan: Telephone appointment with care management team member scheduled for:06/02/2019  Noreene Larsson, [place], [place], [place] [address] Direct Dial: [phone] [email or web address] Website: [email or web address]

## 2019-05-15 ENCOUNTER — Ambulatory Visit: Payer: Self-pay | Admitting: Pharmacist

## 2019-05-15 ENCOUNTER — Encounter: Payer: Self-pay | Admitting: *Deleted

## 2019-05-15 ENCOUNTER — Encounter: Payer: Self-pay | Admitting: Family Medicine

## 2019-05-15 ENCOUNTER — Ambulatory Visit (INDEPENDENT_AMBULATORY_CARE_PROVIDER_SITE_OTHER): Payer: Medicare Other | Admitting: Family Medicine

## 2019-05-15 ENCOUNTER — Other Ambulatory Visit: Payer: Self-pay | Admitting: *Deleted

## 2019-05-15 ENCOUNTER — Other Ambulatory Visit: Payer: Self-pay

## 2019-05-15 ENCOUNTER — Telehealth: Payer: Self-pay | Admitting: Family Medicine

## 2019-05-15 VITALS — BP 140/80 | HR 80 | Temp 95.8°F | Ht 73.0 in | Wt 271.6 lb

## 2019-05-15 DIAGNOSIS — F419 Anxiety disorder, unspecified: Secondary | ICD-10-CM | POA: Diagnosis not present

## 2019-05-15 DIAGNOSIS — F32A Depression, unspecified: Secondary | ICD-10-CM

## 2019-05-15 DIAGNOSIS — Z794 Long term (current) use of insulin: Secondary | ICD-10-CM

## 2019-05-15 DIAGNOSIS — E1142 Type 2 diabetes mellitus with diabetic polyneuropathy: Secondary | ICD-10-CM

## 2019-05-15 DIAGNOSIS — F329 Major depressive disorder, single episode, unspecified: Secondary | ICD-10-CM

## 2019-05-15 DIAGNOSIS — Z1211 Encounter for screening for malignant neoplasm of colon: Secondary | ICD-10-CM

## 2019-05-15 DIAGNOSIS — L989 Disorder of the skin and subcutaneous tissue, unspecified: Secondary | ICD-10-CM | POA: Diagnosis not present

## 2019-05-15 DIAGNOSIS — W19XXXA Unspecified fall, initial encounter: Secondary | ICD-10-CM

## 2019-05-15 DIAGNOSIS — I10 Essential (primary) hypertension: Secondary | ICD-10-CM | POA: Diagnosis not present

## 2019-05-15 DIAGNOSIS — R195 Other fecal abnormalities: Secondary | ICD-10-CM | POA: Insufficient documentation

## 2019-05-15 DIAGNOSIS — E785 Hyperlipidemia, unspecified: Secondary | ICD-10-CM | POA: Diagnosis not present

## 2019-05-15 LAB — BASIC METABOLIC PANEL
BUN: 19 mg/dL (ref 6–23)
CO2: 30 mEq/L (ref 19–32)
Calcium: 9.3 mg/dL (ref 8.4–10.5)
Chloride: 100 mEq/L (ref 96–112)
Creatinine, Ser: 1.08 mg/dL (ref 0.40–1.50)
GFR: 81.04 mL/min (ref >60.00)
Glucose, Bld: 140 mg/dL — ABNORMAL HIGH (ref 70–99)
Potassium: 4.3 mEq/L (ref 3.5–5.1)
Sodium: 137 mEq/L (ref 135–145)

## 2019-05-15 LAB — HEMOGLOBIN A1C: Hgb A1c MFr Bld: 8.4 % — ABNORMAL HIGH (ref 4.6–6.5)

## 2019-05-15 LAB — LDL CHOLESTEROL, DIRECT: Direct LDL: 53 mg/dL

## 2019-05-15 MED ORDER — BUPROPION HCL ER (XL) 150 MG PO TB24: ORAL_TABLET | ORAL | 1 refills | Status: DC

## 2019-05-15 MED ORDER — BD PEN NEEDLE NANO U/F 32G X 4 MM MISC: 4 refills | Status: AC

## 2019-05-15 NOTE — Assessment & Plan Note (Signed)
Due for FOBT.  Cards given.

## 2019-05-15 NOTE — Patient Instructions (Signed)
Nice to see you.  Please start the wellbutrin for depression. I will see you back in 6-8 weeks for this. Please arrive 15 minutes prior to the appointment time.  I have referred you to dermatology. Someone will call you about this.  We will contact you with your labs.  Please complete the stool cards as well.

## 2019-05-15 NOTE — Assessment & Plan Note (Signed)
No significant injury.  Discussed being aware of his surroundings.

## 2019-05-15 NOTE — Assessment & Plan Note (Signed)
Check labs.  Continue current regimen.

## 2019-05-15 NOTE — Telephone Encounter (Signed)
As I was speaking with patient his blood sugar came up to 87. Patient checked it twice while on phone. He was concerned that the numbers went below 80. Over the past couple ours his blood sugar has dropped over 100 points. He is not having sx of low blood surgar. I did inform him if it started to drop low after eating and taking glucose tabs he would need to go to ED. He stated he will comply but will hold off since BS was coming back up.

## 2019-05-15 NOTE — Assessment & Plan Note (Signed)
Refer to dermatology 

## 2019-05-15 NOTE — Telephone Encounter (Signed)
Pt called and said that his Blood Sugar is dropping it is now at 78.. That is after him eating and drinking sweet tea also he had 4 glucose tabs

## 2019-05-15 NOTE — Patient Outreach (Signed)
[facility]) Care Management [place] Telephone [facility] Coordination   05/15/2019  NESBIT MURRA 1945-10-12 [id number]  [place] outreach attemptto [name], 74 y/o male referred to [place] Pharmacist embedded in patient's PCP officefor possible community resource needs in dealing with chronic depression. 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  HIPAA/ identity verified;patient reports today "doing pretty good," and he denies clinical concerns; states that he attended recent pain clinic provider appointment and received injection "for ablation" in hip; reports better pain levels since this procedure; states pain today manageable at "3-4/ 10" and he sounds to be in no distress throughout call today.  Patient further reports:  -- attended today's PCP office visit and had A1-C level drawn; the results of his A1-C from today were reviewed with patient; discussed that his A1-C from today was in same range as previous readings (today= 8.4; last reading from 10/26/18= 8.5); patient disappointed that his A1-C has not decreased significantly and states he has "been trying to eat right;" using teach back method, reviewed with patient previously provided education around dietary self-health management of DM and brainstormed with patient's around current dietary practices, carb counting, and limiting sugar intake.  Encouraged patient to continue his efforts.  -- discussed medications; continues working with [place] team ([name]) embedded at PCP office- patient denies concerns around medications, and continues to self- manage medications, including insulin; verbalizes accurate understanding of dosing for insulin;  however, he states that he was fasting during office visit and after he left the PCP office visit today, his blood sugar "dropped" despite that he left office visit and immediately took short-acting insulin and ate lunch-- reports this low reading gradually increased and denies signs/ symptoms hypoglycemia. ---- clarified with patient that he is taking [place] and Victoza together during morning hours: discussed that Tyler Aas is ordered at night before bed; patient states he "did not know" and confirmed while on the phone that his prescription box instructs to take at night; placed care coordination outreach to [facility] Pharmacist to clarify and encouraged patient to listen out for [facility] Pharmacist for further instruction/ clarification  ---- confirmed that he is aware of newly prescribed instructions around buproprion for depression and has plans to retrieve from outpatient pharmacy once ready for pick up   -- reviewed with patient his printed blood sugar trends from recent [name] report  ---- reiterated previously provided education around carbohydrate recommendations for each day/ meal, and discussed importance of including fiber with carbohydrates to maximumize sugar metabolism/ limiting sugar intake: confirmed that he received previously provided printed educational material in mail and encouraged his ongoing review of same  Patient denies further issues, concerns, or problems today. Iconfirmed patient hasmy direct phone number, the main [place] office phone number, and the [facility] CM 24-hour nurse advice phone number should issues arise prior to next scheduled [facility] CM outreachnext month. Encouraged patient to contact me directly if needs, questions, issues, or concerns arise prior to next scheduled outreach; patient agreed to do so.  Plan:  Patient will take medications as prescribed and will attend all scheduled provider appointments  Patient will promptly notify care providers  for any new concerns/ issues/ problems that arise  Patient will continue monitoring/ recordingblood sugars at home using free-style [place]  CGM system  Patient will maintain engagement with [facility] Pharmacist embedded at his PCP office  Will share today's [facility] RN CM note/ care plan with PCP as [facility] CM quarterly update  [place] outreach to continue with scheduled phone call nextmonth  [facility] CM Care Plan Problem One     Most Recent Value  Care Plan Problem One  Self-health management of chronic disease state of Diabetes in patient with depression as evidenced by patient reporting  Role Documenting the Problem One  Care Management [place] for Problem One  Active  [facility] Long Term Goal   Over the next 60 days, patient will verbalize 3 dietary strategies for blood sugar management as evidenced by patient reporting during [facility] RN CM outreach [Goal re-established around ongoing education]  [place] Term Goal Start Date  05/15/19  Interventions for Problem One Long Term Goal  Discussed with patient his current clinical condition and confirmed that he attended today's PCP office visit and had updated lab work for [employer],  reviewed with patient post- PCP office visit instructions and A1-C results,  using teach back method, provided ongoing education around dietary self-health management of DM,  completed nutritional assessment and encouraged patient to continue his efforts around better dietary management of DM/ carb counting and limiting sugar intake  [facility] CM Short Term Goal #1   Over the next 30 days, patient will continue actively working with [place] team and will discuss his current medication regimen for medications for control of DM, as evidenced by patient reporting and collaboration with [place] team as indicated during [facility] RN CM outreach  [facility] CM Short Term Goal #1 Start Date  05/15/19  Interventions for Short Term Goal #1  Discussed with patient his current medication  regimen for DM medication currently prescribed,  updated medication list per patient report and placed care coordination outreach to [facility] Pharmacist around timing of patient's long-acting medications,  placed subsequent call to patient to update him on [place] team instructions/ response to his questions     Oneta Rack, RN, BSN, [place] Care Management  [phone]

## 2019-05-15 NOTE — Patient Instructions (Signed)
Visit Information  Goals Addressed            This Visit's Progress     Patient Stated   . "I want to keep an eye on my diabetes" (pt-stated)       Current Barriers:  . Diabetes: uncontrolled though improved per CGM results; most recent A1c 8.5%, checking today . Current antihyperglycemic regimen: metformin 1000 mg BID, Jardiance 25 mg daily, Victoza 1.8 mg daily, Tresiba 64 units daily, Humalog 12 with meals 3x daily, though occasionally using 8-10 units if smaller meals  . Current blood glucose readings: see above o Average glucose 162, GMI 7.2% o Time in goal (70-180): 70%; time above goal: 29%; time below goal: 1% o Patterns: post prandial excursions . Cardiovascular risk reduction: o Current hypertensive regimen: amlodipine 5 mg daily, carvedilol 25 mg BID, HCTZ 25 mg daily, olmesartan 40 mg daily;  o Current hyperlipidemia regimen: pravastatin 40 mg daily; LDL well controlled last check <70 . Mental health concerns: escitalopram 20 mg daily, alprazolam 0.5 mg PRN; bupropion added today by PCP . Chronic pain: Oxycodone 10 mg 6H PRN, meloxicam 15 mg daily  Pharmacist Clinical Goal(s):  Marland Kitchen Over the next 90 days, patient with work with PharmD and primary care provider to address optimized diabetes management  Interventions: . Will collaborate w/ Dr. [name] regarding glucose readings. A1c being checked today. Patient and I have a follow up call in ~3 weeks  Patient Self Care Activities:  . Patient will check blood glucose with CGM, document, and provide at future appointment . Patient will take medications as prescribed . Patient will report any questions or concerns to provider   Please see past updates related to this goal by clicking on the "Past Updates" button in the selected goal         Patient verbalizes understanding of instructions provided today.   Plan:  - Will outreach patient as previously scheduled  [name], PharmD, [place], [place]  Pharmacist [place] [phone]

## 2019-05-15 NOTE — Assessment & Plan Note (Signed)
Worsened.  We will add Wellbutrin to his Lexapro.  He denies history of seizures.  We will see him back in 6 weeks.  Discussed that it could take a number of weeks for this to start to provide benefit.

## 2019-05-15 NOTE — Chronic Care Management (AMB) (Signed)
Chronic Care Management   Follow Up Note   05/15/2019 Name: Alan Schmidt MRN: [id number] DOB: [birth year]  Referred by: [name], MD Reason for referral : Chronic Care Management (Medication Management)   Alan Schmidt is a 74 y.o. year old male who is a primary care patient of [name], [name], MD. The CCM team was consulted for assistance with chronic disease management and care coordination needs.   Downloaded CGM readings for PCP appointment today.   Review of patient status, including review of consultants reports, relevant laboratory and other test results, and collaboration with appropriate care team members and the patient's provider was performed as part of comprehensive patient evaluation and provision of chronic care management services.    SDOH (Social Determinants of Health) assessments performed: No See Care Plan activities for detailed interventions related to [facility])     Outpatient Encounter Medications as of 05/15/2019  Medication Sig Note  . acetaminophen (TYLENOL) 500 MG tablet Take 500 mg by mouth every 6 (six) hours as needed. 05/16/2018: PRN  . ALPRAZolam (XANAX) 1 MG tablet Take 0.5 tablets (0.5 mg total) by mouth 2 (two) times daily as needed for anxiety. Please call to schedule follow-up   . amLODipine (NORVASC) 5 MG tablet TAKE 1 TABLET BY MOUTH EVERY DAY   . aspirin EC 81 MG tablet Take 81 mg by mouth daily.   Marland Kitchen buPROPion (WELLBUTRIN XL) 150 MG 24 hr tablet Take 1 tablet (150 mg total) by mouth daily for 7 days, THEN 2 tablets (300 mg total) daily.   . carvedilol (COREG) 25 MG tablet TAKE 1 TABLET BY MOUTH TWICE A DAY   . Continuous Blood Gluc Receiver (FREESTYLE LIBRE 14 DAY READER) DEVI 1 Device by Does not apply route daily. Use to scan to check blood sugar up to 7 times daily; E11.42,   . Continuous Blood Gluc Sensor (FREESTYLE LIBRE 14 DAY SENSOR) MISC 1 Device by Does not apply route every 14 (fourteen) days. E11.42   . desoximetasone  (TOPICORT) 0.25 % cream APPLY CREAM TO AFFECTED AREA TWO TIMES DAILY, FOR UP TO 7 DAYS, DO NOT APPLY TO FACE   . escitalopram (LEXAPRO) 20 MG tablet Take 1 tablet (20 mg total) by mouth daily.   . hydrochlorothiazide (HYDRODIURIL) 25 MG tablet TAKE 1 TABLET BY MOUTH EVERY DAY   . HYDROcodone-acetaminophen (NORCO/VICODIN) 5-325 MG tablet Take 1 tablet by mouth every 8 (eight) hours as needed for up to 7 days for severe pain. Must last 7 days.   Derrill Memo ON 05/16/2019] HYDROcodone-acetaminophen (NORCO/VICODIN) 5-325 MG tablet Take 1 tablet by mouth every 8 (eight) hours as needed for up to 7 days for severe pain. Must last for 7 days.   . insulin lispro (HUMALOG KWIKPEN) 100 UNIT/ML KwikPen INJECT 0.12-0.18 MLS (12-18 UNITS TOTAL) INTO THE SKIN 2 (TWO) TIMES DAILY WITH A MEAL. 02/13/2019: 12 units   . Insulin Pen Needle (BD PEN NEEDLE NANO U/F) 32G X 4 MM MISC USE EVERY DAY   . JARDIANCE 25 MG TABS tablet TAKE 1 TABLET BY MOUTH EVERY DAY   . loratadine (CLARITIN) 10 MG tablet TAKE 1 TABLET BY MOUTH EVERY DAY   . [START ON 05/30/2019] lubiprostone (AMITIZA) 8 MCG capsule Take 1 capsule (8 mcg total) by mouth 2 (two) times daily with a meal. Swallow the medication whole. Do not break or chew the medication.   . meloxicam (MOBIC) 15 MG tablet Take 1 tablet (15 mg total) by mouth daily.   Marland Kitchen  metFORMIN (GLUCOPHAGE) 1000 MG tablet TAKE 1 TABLET BY MOUTH TWICE A DAY WITH A MEAL   . mometasone (NASONEX) 50 MCG/ACT nasal spray INSTILL TWO SPRAYS INTO THE NOSE DAILY   . naloxone (NARCAN) 2 MG/2ML injection Inject 1 mL (1 mg total) into the muscle as needed for up to 2 doses (for emergency use only). Always have available. Inject into thigh muscle and call 911   . olmesartan (BENICAR) 40 MG tablet TAKE 1 TABLET BY MOUTH EVERY DAY   . [START ON 05/30/2019] Oxycodone HCl 10 MG TABS Take 1 tablet (10 mg total) by mouth every 6 (six) hours as needed. Must last 30 days   . [START ON 06/29/2019] Oxycodone HCl 10 MG TABS Take  1 tablet (10 mg total) by mouth every 6 (six) hours as needed. Must last 30 days   . [START ON 07/29/2019] Oxycodone HCl 10 MG TABS Take 1 tablet (10 mg total) by mouth every 6 (six) hours as needed. Must last 30 days   . pantoprazole (PROTONIX) 40 MG tablet TAKE 1 TABLET BY MOUTH EVERY DAY   . pravastatin (PRAVACHOL) 40 MG tablet TAKE 1 TABLET BY MOUTH EVERY DAY   . sennosides-docusate sodium (SENOKOT-S) 8.6-50 MG tablet Take 1 tablet by mouth daily.   . tamsulosin (FLOMAX) 0.4 MG CAPS capsule TAKE 1 CAPSULE (0.4 MG TOTAL) BY MOUTH 2 (TWO) TIMES DAILY.   Marland Kitchen TRESIBA FLEXTOUCH 100 UNIT/ML SOPN FlexTouch Pen INJECT 40 UNITS INTO THE SKIN DAILY AT 10 PM. 01/12/2019: 46 units   . VICTOZA 18 MG/3ML SOPN INJECT 0.3 MLS (1.8 MG TOTAL) INTO THE SKIN DAILY.   Marland Kitchen Wheat Dextrin (BENEFIBER) POWD Take 6 g by mouth 3 (three) times daily before meals. (2 tsp = 6 g)   . [DISCONTINUED] BD PEN NEEDLE NANO U/F 32G X 4 MM MISC USE EVERY DAY    No facility-administered encounter medications on file as of 05/15/2019.     Objective:          Goals Addressed            This Visit's Progress     Patient Stated   . "I want to keep an eye on my diabetes" (pt-stated)       Current Barriers:  . Diabetes: uncontrolled though improved per CGM results; most recent A1c 8.5%, checking today . Current antihyperglycemic regimen: metformin 1000 mg BID, Jardiance 25 mg daily, Victoza 1.8 mg daily, Tresiba 64 units daily, Humalog 12 with meals 3x daily, though occasionally using 8-10 units if smaller meals  . Current blood glucose readings: see above o Average glucose 162, GMI 7.2% o Time in goal (70-180): 70%; time above goal: 29%; time below goal: 1% o Patterns: post prandial excursions . Cardiovascular risk reduction: o Current hypertensive regimen: amlodipine 5 mg daily, carvedilol 25 mg BID, HCTZ 25 mg daily, olmesartan 40 mg daily;  o Current hyperlipidemia regimen: pravastatin 40 mg daily; LDL well controlled  last check <70 . Mental health concerns: escitalopram 20 mg daily, alprazolam 0.5 mg PRN; bupropion added today by PCP . Chronic pain: Oxycodone 10 mg 6H PRN, meloxicam 15 mg daily  Pharmacist Clinical Goal(s):  Marland Kitchen Over the next 90 days, patient with work with PharmD and primary care provider to address optimized diabetes management  Interventions: . Will collaborate w/ Dr. [name] regarding glucose readings. A1c being checked today. Patient and I have a follow up call in ~3 weeks  Patient Self Care Activities:  . Patient will check  blood glucose with CGM, document, and provide at future appointment . Patient will take medications as prescribed . Patient will report any questions or concerns to provider   Please see past updates related to this goal by clicking on the "Past Updates" button in the selected goal          Plan:  - Will outreach patient as previously scheduled  [name], PharmD, [place], [place] Pharmacist [place] [phone]

## 2019-05-15 NOTE — Telephone Encounter (Signed)
His glucose is in a normal range even at 78. He should continue to monitor his glucose and if it drops to less than 70 or has symptoms of hypoglycemia then he needs to contact us immediately.

## 2019-05-15 NOTE — Assessment & Plan Note (Signed)
Check lipid panel  

## 2019-05-15 NOTE — Progress Notes (Signed)
[name], MD Phone: [phone]  ANTARIO DELFINO is a 74 y.o. male who presents today for f/u.  DIABETES Disease Monitoring: Blood Sugar ranges-30 day avg 153, 90 day avg 128 Polyuria/phagia/dipsia- no      Optho- UTD Medications: Compliance- taking humalog 14 u with meals, tresiba 46 u, jardiance, metformin, victoza Hypoglycemic symptoms- rare if in the 60s  Depression/anxiety: Patient notes his depression has been worse recently.  He was in a lot of pain until recently when he had a radiofrequency ablation that helped with his pain.  He notes that was quite rough.  He also notes that the Angelena Sole trial has gotten him down and then several other things that he has heard for read about race issues have been affecting him as well.  He does note some anxiety though it is mostly depression.  He is taking Lexapro.  He takes half a Xanax in the morning and half a Xanax at night.  It does not make him drowsy.  No alcohol intake.  No SI.  Falls: The patient notes he has fallen on a couple of occasions.  Once he slipped on some liquid and the other time he tripped.  No significant injuries.  He does note one of the falls intensified his chronic pain for a while.  Skin lesions: Patient notes he just noticed some skin lesions over his left temple.  There are brown spots.  He recently shaved his head and that is when they became apparent.  He has not seen dermatology.  Social History   Tobacco Use  Smoking Status Former Smoker  Smokeless Tobacco Never Used     ROS see history of present illness  Objective  Physical Exam Vitals:   05/15/19 0849  BP: 140/80  Pulse: 80  Temp: (!) 95.8 F (35.4 C)  SpO2: 96%    BP Readings from Last 3 Encounters:  05/15/19 140/80  05/09/19 134/73  12/27/18 (!) 132/100   Wt Readings from Last 3 Encounters:  05/15/19 271 lb 9.6 oz (123.2 kg)  05/09/19 250 lb (113.4 kg)  12/27/18 250 lb (113.4 kg)    Physical Exam Constitutional:    General: He is not in acute distress.    Appearance: He is not diaphoretic.  HENT:     Head:   Cardiovascular:     Rate and Rhythm: Normal rate and regular rhythm.     Heart sounds: Normal heart sounds.  Pulmonary:     Effort: Pulmonary effort is normal.     Breath sounds: Normal breath sounds.  Musculoskeletal:     Right lower leg: No edema.     Left lower leg: No edema.  Skin:    General: Skin is warm and dry.  Neurological:     Mental Status: He is alert.      Assessment/Plan: Please see individual problem list.  Diabetes ([place]) Check labs.  Continue current regimen.  Anxiety and depression Worsened.  We will add Wellbutrin to his Lexapro.  He denies history of seizures.  We will see him back in 6 weeks.  Discussed that it could take a number of weeks for this to start to provide benefit.  Fall No significant injury.  Discussed being aware of his surroundings.  Colon cancer screening Due for FOBT.  Cards given.  Hyperlipidemia Check lipid panel.  Skin lesion Refer to dermatology.    Orders Placed This Encounter  Procedures  . Fecal occult blood, imunochemical    Standing Status:  Future    Standing Expiration Date:   05/14/2020  . Ambulatory referral to Dermatology    Referral Priority:   Routine    Referral Type:   Consultation    Referral Reason:   Specialty Services Required    Requested Specialty:   Dermatology    Number of Visits Requested:   1    Meds ordered this encounter  Medications  . buPROPion (WELLBUTRIN XL) 150 MG 24 hr tablet    Sig: Take 1 tablet (150 mg total) by mouth daily for 7 days, THEN 2 tablets (300 mg total) daily.    Dispense:  180 tablet    Refill:  1  . Insulin Pen Needle (BD PEN NEEDLE NANO U/F) 32G X 4 MM MISC    Sig: USE EVERY DAY    Dispense:  100 each    Refill:  4    This visit occurred during the SARS-CoV-2 public health emergency.  Safety protocols were in place, including screening questions prior to the  visit, additional usage of staff PPE, and extensive cleaning of exam room while observing appropriate contact time as indicated for disinfecting solutions.    [name], MD [place]

## 2019-05-16 ENCOUNTER — Other Ambulatory Visit: Payer: Self-pay | Admitting: *Deleted

## 2019-05-16 NOTE — Patient Outreach (Signed)
[facility]) Care Management [place] Telephone [facility] Coordination  05/16/2019  Alan Schmidt 07/06/1945 [id number]  Unsuccessful telephone outreach attemptto Alan Schmidt, 74 y/o male initially referred to [place] Pharmacist embedded in patient's PCP officefor possible community resource needs in dealing with chronic depression. 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  [facility] RN CM continues working with patient around self-health management strategies for chronic disease state of DM.  Received follow up response from [place] around care coordination placed yesterday at time of patient's call with me, re: timing of currently prescribed Tresiba and Victoza; [name] clarified that these medications are okay to be taken together in the morning or the evening/ HS  10:40 am: placed call to patient to follow up and inform him around response from [place] Pharmacist- left details of her response/ instructions on his voice mail; received immediate text message back form patient, thanking for update and confirming that he understands  Plan:  Will continue to collaborate with [place] team as indicated  [facility] CM outreach to patient as scheduled, next month  Oneta Rack, RN, BSN, [place] Care Management  [phone]

## 2019-05-16 NOTE — Telephone Encounter (Signed)
LVM for the patient to return a call back to discuss BS.  Jacolyn Joaquin,cma

## 2019-05-17 ENCOUNTER — Other Ambulatory Visit: Payer: Self-pay | Admitting: Family Medicine

## 2019-05-17 DIAGNOSIS — E1142 Type 2 diabetes mellitus with diabetic polyneuropathy: Secondary | ICD-10-CM

## 2019-05-17 NOTE — Telephone Encounter (Signed)
I called the patient and explained to him that he should not let the Sugar  drop to 70. If it does I informed him to give [place] a call. He understood.  Tonnie Friedel,cma

## 2019-05-23 ENCOUNTER — Other Ambulatory Visit: Payer: Self-pay | Admitting: Family Medicine

## 2019-05-24 ENCOUNTER — Telehealth: Payer: Medicare Other | Admitting: Pain Medicine

## 2019-05-25 ENCOUNTER — Other Ambulatory Visit (INDEPENDENT_AMBULATORY_CARE_PROVIDER_SITE_OTHER): Payer: Medicare Other

## 2019-05-25 ENCOUNTER — Encounter: Payer: Self-pay | Admitting: Pain Medicine

## 2019-05-25 DIAGNOSIS — Z1211 Encounter for screening for malignant neoplasm of colon: Secondary | ICD-10-CM | POA: Diagnosis not present

## 2019-05-25 LAB — FECAL OCCULT BLOOD, IMMUNOCHEMICAL: Fecal Occult Bld: NEGATIVE

## 2019-05-25 NOTE — Progress Notes (Signed)
Pain relief after procedure (treated area only): (Questions asked to patient) 1. Starting about 15 minutes after the procedure, and "while the area was still numb" (from the local anesthetics), were you having any of your usual pain "in that area" (the treated area)?  (NOTE: NOT including the discomfort from the needle sticks.) First 1 hour:90 % better. First 4-6 hours: 90 % better. 2. How long did the numbness from the local anesthetics last? (More than 6 hours?) Duration: 24 hours.  3. How much better is your pain now, when compared to before the procedure? Current benefit: 77% better. 4. Can you move better now? Improvement in ROM (Range of Motion): Yes. 5. Can you do more now? Improvement in function: Yes. 4. Did you have any problems with the procedure? Side-effects/Complications: No.

## 2019-05-26 ENCOUNTER — Telehealth: Payer: Self-pay | Admitting: Family Medicine
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
## 2019-05-26 NOTE — Chronic Care Management (AMB) (Signed)
  Care Management   Note  05/26/2019 Name: Alan Schmidt MRN: [id number] DOB: 1945-05-28  Alan Schmidt is a 74 y.o. year old male who is a primary care patient of [name], MD and is actively engaged with the care management team. I reached out to [name] by phone today to assist with re-scheduling an initial visit with the Pharmacist  Follow up plan: Unsuccessful telephone outreach attempt made. A HIPPA compliant phone message was left for the patient providing contact information and requesting a return call.  The care management team will reach out to the patient again over the next 7 days.  If patient returns call to provider office, please advise to call [facility]  at [place], [place], [place], [place] [address] Direct Dial: [phone] [email or web address] Website: [email or web address]

## 2019-05-26 NOTE — Chronic Care Management (AMB) (Signed)
  Care Management   Note  05/26/2019 Name: Alan Schmidt MRN: [id number] DOB: Jan 07, 1946  Alan Schmidt is a 74 y.o. year old male who is a primary care patient of [name], MD and is actively engaged with the care management team. I reached out to [name] by phone today to assist with re-scheduling a follow up visit with the Pharmacist  Follow up plan: Telephone appointment with care management team member scheduled for:06/05/2019  Noreene Larsson, [name], [place], [place] [address] Direct Dial: [phone] [email or web address] Website: [email or web address]

## 2019-05-29 ENCOUNTER — Telehealth: Payer: Medicare Other | Admitting: Pain Medicine

## 2019-05-29 ENCOUNTER — Telehealth: Payer: Self-pay | Admitting: *Deleted

## 2019-05-29 ENCOUNTER — Other Ambulatory Visit: Payer: Self-pay

## 2019-05-29 ENCOUNTER — Ambulatory Visit: Payer: Medicare Other | Attending: Pain Medicine | Admitting: Pain Medicine

## 2019-05-29 DIAGNOSIS — M5442 Lumbago with sciatica, left side: Secondary | ICD-10-CM | POA: Diagnosis not present

## 2019-05-29 DIAGNOSIS — Z79899 Other long term (current) drug therapy: Secondary | ICD-10-CM

## 2019-05-29 DIAGNOSIS — M5136 Other intervertebral disc degeneration, lumbar region: Secondary | ICD-10-CM

## 2019-05-29 DIAGNOSIS — M899 Disorder of bone, unspecified: Secondary | ICD-10-CM

## 2019-05-29 DIAGNOSIS — G894 Chronic pain syndrome: Secondary | ICD-10-CM | POA: Diagnosis not present

## 2019-05-29 DIAGNOSIS — M47816 Spondylosis without myelopathy or radiculopathy, lumbar region: Secondary | ICD-10-CM | POA: Diagnosis not present

## 2019-05-29 DIAGNOSIS — M5441 Lumbago with sciatica, right side: Secondary | ICD-10-CM

## 2019-05-29 DIAGNOSIS — Z789 Other specified health status: Secondary | ICD-10-CM | POA: Insufficient documentation

## 2019-05-29 DIAGNOSIS — G8929 Other chronic pain: Secondary | ICD-10-CM

## 2019-05-29 NOTE — Telephone Encounter (Signed)
I see there was an order for PT dated 02-07-19. It was cancelled for some reason. Will you please reorder?

## 2019-05-30 ENCOUNTER — Other Ambulatory Visit: Payer: Self-pay | Admitting: Pain Medicine

## 2019-05-30 ENCOUNTER — Telehealth: Payer: Self-pay | Admitting: Family Medicine

## 2019-05-30 DIAGNOSIS — E1142 Type 2 diabetes mellitus with diabetic polyneuropathy: Secondary | ICD-10-CM

## 2019-05-30 DIAGNOSIS — G894 Chronic pain syndrome: Secondary | ICD-10-CM | POA: Diagnosis not present

## 2019-05-30 DIAGNOSIS — Z79899 Other long term (current) drug therapy: Secondary | ICD-10-CM | POA: Diagnosis not present

## 2019-05-30 MED ORDER — ACCU-CHEK GUIDE VI STRP: ORAL_STRIP | 5 refills | Status: DC

## 2019-05-30 NOTE — Telephone Encounter (Signed)
I called the patient and he informed me the name of his glucose meter and I ordered his strips.  Roc Streett,cma

## 2019-05-30 NOTE — Telephone Encounter (Signed)
Pt said he called his pharmacy 3 weeks ago regarding a request for Accu check guide test strips. Pt said he is now currently out of test strips and [facility]

## 2019-05-31 ENCOUNTER — Other Ambulatory Visit: Payer: Self-pay | Admitting: *Deleted

## 2019-05-31 ENCOUNTER — Encounter: Payer: Self-pay | Admitting: *Deleted

## 2019-05-31 NOTE — Patient Outreach (Signed)
[facility]) Care Management [place] Telephone Outreach  05/31/2019  Alan Schmidt March 10, 1945 [id number]  Received incoming voice mail from patient on afternoon of May 30, 2019, requesting call back  11:00 am: Successfultelephone outreach attemptto Alan Schmidt, 74 y/o male referred to [place] Pharmacist embedded in patient's PCP officefor possible community resource needs in dealing with chronic depression. 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  HIPAA/ identity verified;patientreports today "doing okay," however, he reports that since he received his last (most recent updated 05/15/19) A1-C he has been "eating less and have really cut back on portions."  States that he has subsequently experienced several hypoglycemic episodes, stating that he has had values of "58/ 68/ 70's/ and 80's."  Reports associated symptoms of low blood sugar which he immediately addresses "by drinking something high in sugar;" which he reports "helps" to increase blood sugar.  He reports fasting blood sugar this morning of "111," followed by post-prandial reading of "181."  He sounds to be in no distress throughout phone call today.  Discussed with patient: -- using teach back method, reiterated/ reviewed previously provided education around carb intake recommendations per meal in setting of DM; discussed importance of eating properly and not limiting food intake to the point that he becomes hypoglycemic -- need to adjust prescribed medications if he continues experiencing low blood sugar episodes; encouraged him to continue monitoring blood sugars and to maintain contact with [facility] Pharmacist embedded at PCP office for optimization of prescribed medication dosing -- confirmed that  patient has appropriate action plan for treating episodes of hypoglycemia, and that the action plan promptly works to increase his blood sugars; confirmed that he understands signs/ symptoms low blood sugar and potential complications of hypoglycemia   -- reiterated basic pathophysiology of diabetes and need to eat appropriately, especially in setting of insulin therapy -- confirmed that patient has scheduled appointment with nutritionist for formal diabetes education: encouraged patient to attend/ fully engage with nutritionist: advised that he should begin journaling his food intake and take to appointment with him, and also a list of his questions/ challenges; confirmed that patient plans to attend scheduled appointment on Jun 13, 2019 -- continues using [employer] and glucometer (as needed) several times per day  Patient denies further issues, concerns, or problems today. Iconfirmed patient hasmy direct phone number, the main [place] office phone number, and the [facility] CM 24-hour nurse advice phone number should issues arise prior to next scheduled [facility] CM outreachnext month. Encouraged patient to contact me directly if needs, questions, issues, or concerns arise prior to next scheduled outreach; patient agreed to do so.  Plan:  Patient will take medications as prescribed and will attend all scheduled provider appointments  Patient will promptly notify care providers for any new concerns/ issues/ problems that arise  Patient will continue monitoring/ recordingblood sugars at home using [name] CGM system  Patient will maintain engagement with [facility] Pharmacist embedded at his PCP office  Patient will begin keeping food journal and will take to scheduled appointment with nutritionist for formal diabetes education  Will make patient's PCP and [facility] Pharmacist aware of his recent report of episodes of hypoglycemia- will send secure message through [place] outreach to continue with scheduled phone call nextmonth  [facility] CM  Care Plan Problem One     Most Recent Value  Care Plan Problem One  Self-health management of chronic disease state of Diabetes in patient with depression as evidenced by patient reporting  Role Documenting the Problem One  Care Management Coordinator  Care Plan for Problem One  Active  [facility] Long Term Goal   Over the next 60 days, patient will verbalize 3 dietary strategies for blood sugar management as evidenced by patient reporting during [facility] RN CM outreach [Goal re-established around ongoing education]  [place] Term Goal Start Date  05/15/19  Interventions for Problem One Long Term Goal  Discussed with patient his recent self- guided dietary changes around carb/ sugar limiting and portion control, with subsequent low blood sugars,  using teach back method, reiterated previously provided education around carb intake recommendations in setting of DM,  discussed dangers of low blood sugar when using prescribed medications,  discussed action plan for episodes of hypoglycemia and confirmed that patient has been following appropriate action plan for episodes of low blood sugars  [facility] CM Short Term Goal #1   Over the next 30 days, patient will continue actively working with [place] team and will discuss his current medication regimen for medications for control of DM, as evidenced by patient reporting and collaboration with [place] team as indicated during [facility] RN CM outreach  [facility] CM Short Term Goal #1 Start Date  05/15/19  Interventions for Short Term Goal #1  Encouraged patient to reach out to [facility] Pharmacist, currently involved in his care, should he continue to experience ongoing low blood sugars,  discussed need to adjust medications accordingly for ongoing low or high blood sugars,  placed care coordination outreach to [facility] Pharmacist as Juluis Rainier regarding patient's reports of recent episodes of low blood sugars  [facility] CM Short  Term Goal #2   Over the next 30 days, patient will fully engage with nutritionist as referred by PCP around formal diabetes education, as evidenced by patient reporting and collaboration with nutritionist as indicated during [facility] RN CM outreach  [facility] CM Short Term Goal #2 Start Date  05/31/19  Interventions for Short Term Goal #2  Confirmed that patient has been contacted by nutritionist and has scheduled appointment on Jun 13, 2019: encouraged patient to fully engage with nutritionist for formal diabetes education,  encouraged patient to begin food intake/ diet journalling to take with him to new patient visit with nutritionist,  confirmed that patient verbalizes plans to attend appointment with nutritionist     Oneta Rack, RN, BSN, [place] Care Management  [phone]

## 2019-06-02 ENCOUNTER — Telehealth: Payer: Medicaid Other

## 2019-06-02 LAB — TOXASSURE SELECT 13 (MW), URINE

## 2019-06-05 ENCOUNTER — Ambulatory Visit (INDEPENDENT_AMBULATORY_CARE_PROVIDER_SITE_OTHER): Payer: Medicare Other | Admitting: Pharmacist

## 2019-06-05 ENCOUNTER — Other Ambulatory Visit: Payer: Self-pay | Admitting: Family Medicine

## 2019-06-05 DIAGNOSIS — E1142 Type 2 diabetes mellitus with diabetic polyneuropathy: Secondary | ICD-10-CM

## 2019-06-05 DIAGNOSIS — E118 Type 2 diabetes mellitus with unspecified complications: Secondary | ICD-10-CM | POA: Diagnosis not present

## 2019-06-05 DIAGNOSIS — Z794 Long term (current) use of insulin: Secondary | ICD-10-CM | POA: Diagnosis not present

## 2019-06-05 MED ORDER — INSULIN LISPRO (1 UNIT DIAL) 100 UNIT/ML (KWIKPEN): PEN_INJECTOR | SUBCUTANEOUS | 2 refills | Status: DC

## 2019-06-05 MED ORDER — TRESIBA FLEXTOUCH 100 UNIT/ML ~~LOC~~ SOPN: 22.0000 [IU] | PEN_INJECTOR | Freq: Every day | SUBCUTANEOUS | 3 refills | Status: DC

## 2019-06-05 NOTE — Chronic Care Management (AMB) (Signed)
Chronic Care Management   Follow Up Note   06/05/2019 Name: SHADI SCOBEY MRN: [id number] DOB: [birth year]  Referred by: [name], MD Reason for referral : Chronic Care Management (Medication Management)   PHENG SENTERS is a 74 y.o. year old male who is a primary care patient of [name], [name], MD. The CCM team was consulted for assistance with chronic disease management and care coordination needs.    Contacted patient for medication management review.   Review of patient status, including review of consultants reports, relevant laboratory and other test results, and collaboration with appropriate care team members and the patient's provider was performed as part of comprehensive patient evaluation and provision of chronic care management services.    SDOH (Social Determinants of Health) assessments performed: Yes See Care Plan activities for detailed interventions related to [facility])     Outpatient Encounter Medications as of 06/05/2019  Medication Sig Note  . acetaminophen (TYLENOL) 500 MG tablet Take 500 mg by mouth every 6 (six) hours as needed. 06/05/2019: Taking PRN, up to a couple times daily   . ALPRAZolam (XANAX) 1 MG tablet Take 0.5 tablets (0.5 mg total) by mouth 2 (two) times daily as needed for anxiety. Please call to schedule follow-up 06/05/2019: Taking 0.5 tab BID  . amLODipine (NORVASC) 5 MG tablet TAKE 1 TABLET BY MOUTH EVERY DAY   . aspirin EC 81 MG tablet Take 81 mg by mouth daily.   Marland Kitchen buPROPion (WELLBUTRIN XL) 150 MG 24 hr tablet Take 1 tablet (150 mg total) by mouth daily for 7 days, THEN 2 tablets (300 mg total) daily. 06/05/2019: 300 mg daily   . carvedilol (COREG) 25 MG tablet TAKE 1 TABLET BY MOUTH TWICE A DAY   . Continuous Blood Gluc Receiver (FREESTYLE LIBRE 14 DAY READER) DEVI 1 Device by Does not apply route daily. Use to scan to check blood sugar up to 7 times daily; E11.42,   . Continuous Blood Gluc Sensor (FREESTYLE LIBRE 14 DAY SENSOR) MISC 1  Device by Does not apply route every 14 (fourteen) days. E11.42   . escitalopram (LEXAPRO) 20 MG tablet Take 1 tablet (20 mg total) by mouth daily.   Marland Kitchen glucose blood (ACCU-CHEK GUIDE) test strip USE AS INSTRUCTED DX Code: E11.9   . hydrochlorothiazide (HYDRODIURIL) 25 MG tablet TAKE 1 TABLET BY MOUTH EVERY DAY   . insulin lispro (HUMALOG KWIKPEN) 100 UNIT/ML KwikPen INJECT 0.12-0.18 MLS (12-18 UNITS TOTAL) INTO THE SKIN 2 (TWO) TIMES DAILY WITH A MEAL. 06/05/2019: Taking 7-10 units   . Insulin Pen Needle (BD PEN NEEDLE NANO U/F) 32G X 4 MM MISC USE EVERY DAY   . JARDIANCE 25 MG TABS tablet TAKE 1 TABLET BY MOUTH EVERY DAY   . loratadine (CLARITIN) 10 MG tablet TAKE 1 TABLET BY MOUTH EVERY DAY   . lubiprostone (AMITIZA) 8 MCG capsule Take 1 capsule (8 mcg total) by mouth 2 (two) times daily with a meal. Swallow the medication whole. Do not break or chew the medication. 06/05/2019: PRN 2-3 times weekly   . meloxicam (MOBIC) 15 MG tablet Take 1 tablet (15 mg total) by mouth daily.   . metFORMIN (GLUCOPHAGE) 1000 MG tablet TAKE 1 TABLET BY MOUTH TWICE A DAY WITH A MEAL   . mometasone (NASONEX) 50 MCG/ACT nasal spray INSTILL TWO SPRAYS INTO THE NOSE DAILY   . olmesartan (BENICAR) 40 MG tablet TAKE 1 TABLET BY MOUTH EVERY DAY   . Oxycodone HCl 10 MG TABS Take  1 tablet (10 mg total) by mouth every 6 (six) hours as needed. Must last 30 days   . pantoprazole (PROTONIX) 40 MG tablet TAKE 1 TABLET BY MOUTH EVERY DAY   . pravastatin (PRAVACHOL) 40 MG tablet TAKE 1 TABLET BY MOUTH EVERY DAY   . sennosides-docusate sodium (SENOKOT-S) 8.6-50 MG tablet Take 1 tablet by mouth daily.   . tamsulosin (FLOMAX) 0.4 MG CAPS capsule TAKE 1 CAPSULE (0.4 MG TOTAL) BY MOUTH 2 (TWO) TIMES DAILY.   Marland Kitchen TRESIBA FLEXTOUCH 100 UNIT/ML SOPN FlexTouch Pen INJECT 40 UNITS INTO THE SKIN DAILY AT 10 PM. 06/05/2019: 25 units  . VICTOZA 18 MG/3ML SOPN INJECT 1.8 MG UNDER THE SKIN ONCE DAILY   . desoximetasone (TOPICORT) 0.25 % cream APPLY  CREAM TO AFFECTED AREA TWO TIMES DAILY, FOR UP TO 7 DAYS, DO NOT APPLY TO FACE   . naloxone (NARCAN) 2 MG/2ML injection Inject 1 mL (1 mg total) into the muscle as needed for up to 2 doses (for emergency use only). Always have available. Inject into thigh muscle and call 911   . [START ON 06/29/2019] Oxycodone HCl 10 MG TABS Take 1 tablet (10 mg total) by mouth every 6 (six) hours as needed. Must last 30 days   . [START ON 07/29/2019] Oxycodone HCl 10 MG TABS Take 1 tablet (10 mg total) by mouth every 6 (six) hours as needed. Must last 30 days   . Wheat Dextrin (BENEFIBER) POWD Take 6 g by mouth 3 (three) times daily before meals. (2 tsp = 6 g)    No facility-administered encounter medications on file as of 06/05/2019.     Objective:       Goals Addressed            This Visit's Progress     Patient Stated   . "I want to keep an eye on my diabetes" (pt-stated)       Current Barriers:  . Diabetes: uncontrolled though improved per CGM results; most recent A1c 8.5% o Reports that since his last A1c, he has drastically cut back on portion sizes, and cut back on insulin to prevent hypoglycemia . Current antihyperglycemic regimen: metformin 1000 mg BID, Jardiance 25 mg daily, Victoza 1.8 mg daily, Tresiba 25 units daily, Humalog 7-10 units based on what he is eating . Current meal patterns: o Drinking a lot of carrot juice recently o Frozen meals - [place] Rice and Beans (53 g CHO) or Marie Calendar chicken, potatoes, and corn (53 g CHO) o Watermelon, cantaloupe  . Current blood glucose readings: see above o Date of Download: 4/20-06/05/19 o % Time CGM is active: 96% o Average Glucose: 139 o Glucose Variability: 22.9 (goal <36%) o Time in Goal:  o - Time in range 70-180: 88% o - Time above range: 10% o - Time below range: 2% . Cardiovascular risk reduction: o Current hypertensive regimen: amlodipine 5 mg daily, carvedilol 25 mg BID, HCTZ 25 mg daily, olmesartan 40 mg daily;  o Current  hyperlipidemia regimen: pravastatin 40 mg daily; LDL well controlled last check <70 . Mental health concerns: escitalopram 20 mg daily, alprazolam 0.5 mg PRN; bupropion added today by PCP . Chronic pain: Oxycodone 10 mg 6H PRN, meloxicam 15 mg daily  Pharmacist Clinical Goal(s):  Marland Kitchen Over the next 90 days, patient with work with PharmD and primary care provider to address optimized diabetes management  Interventions: . Comprehensive medication review performed, medication list updated in electronic medical record . Inter-disciplinary care team  collaboration (see longitudinal plan of care) . Reviewed CGM downloads. Some episodes of hypoglycemia. Decrease Tresiba to 22 units daily. Continue Humalog 7-10 units TID with meals . Patient notes he is generally taking Humalog WITH the meal. Encouraged to take insulin 5-10 minutes before his meal.  . Patient has appt w/ Nutritionist next week. Encouraged to continue to document what he is eating. Encouraged to start documenting how much insulin he is administering for future review.  . Reviewed OTC benefits. Notes that he is going to try to order senna + docusate from these benefits.   Patient Self Care Activities:  . Patient will check blood glucose with CGM, document, and provide at future appointment . Patient will take medications as prescribed . Patient will report any questions or concerns to provider   Please see past updates related to this goal by clicking on the "Past Updates" button in the selected goal          Plan:  - Scheduled face to face w/ next PCP appt for CGM review  [name], PharmD, [place], [place] Pharmacist [place] [phone]

## 2019-06-05 NOTE — Patient Instructions (Addendum)
Visit Information  Goals Addressed            This Visit's Progress     Patient Stated   . "I want to keep an eye on my diabetes" (pt-stated)       Current Barriers:  . Diabetes: uncontrolled though improved per CGM results; most recent A1c 8.5% o Reports that since his last A1c, he has drastically cut back on portion sizes, and cut back on insulin to prevent hypoglycemia . Current antihyperglycemic regimen: metformin 1000 mg BID, Jardiance 25 mg daily, Victoza 1.8 mg daily, Tresiba 25 units daily, Humalog 7-10 units based on what he is eating . Current meal patterns: o Drinking a lot of carrot juice recently. Notes he has cut back on soda. o Frozen meals - [place] Rice and Beans (53 g CHO) or Marie Calendar chicken, potatoes, and corn (53 g CHO) o Watermelon, cantaloupe  . Current blood glucose readings: see above o Date of Download: 4/20-06/05/19 o % Time CGM is active: 96% o Average Glucose: 139 o Glucose Variability: 22.9 (goal <36%) o Time in Goal:  o - Time in range 70-180: 88% o - Time above range: 10% o - Time below range: 2% . Cardiovascular risk reduction: o Current hypertensive regimen: amlodipine 5 mg daily, carvedilol 25 mg BID, HCTZ 25 mg daily, olmesartan 40 mg daily;  o Current hyperlipidemia regimen: pravastatin 40 mg daily; LDL well controlled last check <70 . Mental health concerns: escitalopram 20 mg daily, alprazolam 0.5 mg PRN; bupropion added today by PCP . Chronic pain: Oxycodone 10 mg 6H PRN, meloxicam 15 mg daily  Pharmacist Clinical Goal(s):  Marland Kitchen Over the next 90 days, patient with work with PharmD and primary care provider to address optimized diabetes management  Interventions: . Comprehensive medication review performed, medication list updated in electronic medical record . Inter-disciplinary care team collaboration (see longitudinal plan of care) . Reviewed CGM downloads. Some episodes of hypoglycemia. Decrease Tresiba to 22 units daily. Continue  Humalog 7-10 units TID with meals . Patient notes he is generally taking Humalog WITH the meal. Encouraged to take insulin 5-10 minutes before his meal.  . Patient has appt w/ Nutritionist next week. Encouraged to continue to document what he is eating. Encouraged to start documenting how much insulin he is administering for future review.  . Reviewed OTC benefits. Notes that he is going to try to order senna + docusate from these benefits.  . Reviewed that carrot juice does have sugar, and to be careful with how much he is drinking  Patient Self Care Activities:  . Patient will check blood glucose with CGM, document, and provide at future appointment . Patient will take medications as prescribed . Patient will report any questions or concerns to provider   Please see past updates related to this goal by clicking on the "Past Updates" button in the selected goal         Patient verbalizes understanding of instructions provided today.   Plan:  - Scheduled face to face w/ next PCP appt for CGM review  [name], PharmD, [place], [place] Pharmacist [place] [phone]

## 2019-06-06 ENCOUNTER — Other Ambulatory Visit: Payer: Self-pay | Admitting: Family Medicine

## 2019-06-06 NOTE — Telephone Encounter (Signed)
Refill request for xanax, last seen 05-15-19, last filled 05-03-19.  Please advise.

## 2019-06-07 DIAGNOSIS — L851 Acquired keratosis [keratoderma] palmaris et plantaris: Secondary | ICD-10-CM | POA: Diagnosis not present

## 2019-06-07 DIAGNOSIS — Z794 Long term (current) use of insulin: Secondary | ICD-10-CM | POA: Diagnosis not present

## 2019-06-07 DIAGNOSIS — B351 Tinea unguium: Secondary | ICD-10-CM | POA: Diagnosis not present

## 2019-06-07 DIAGNOSIS — E1140 Type 2 diabetes mellitus with diabetic neuropathy, unspecified: Secondary | ICD-10-CM | POA: Diagnosis not present

## 2019-06-12 ENCOUNTER — Other Ambulatory Visit: Payer: Self-pay | Admitting: Family Medicine

## 2019-06-12 DIAGNOSIS — E1142 Type 2 diabetes mellitus with diabetic polyneuropathy: Secondary | ICD-10-CM

## 2019-06-13 ENCOUNTER — Ambulatory Visit: Payer: Medicare Other | Admitting: Dietician

## 2019-06-15 ENCOUNTER — Other Ambulatory Visit: Payer: Self-pay | Admitting: *Deleted

## 2019-06-15 ENCOUNTER — Telehealth: Payer: Self-pay | Admitting: Pain Medicine

## 2019-06-15 ENCOUNTER — Encounter: Payer: Self-pay | Admitting: *Deleted

## 2019-06-15 ENCOUNTER — Ambulatory Visit: Payer: Self-pay | Admitting: Pharmacist

## 2019-06-15 DIAGNOSIS — E1142 Type 2 diabetes mellitus with diabetic polyneuropathy: Secondary | ICD-10-CM

## 2019-06-15 MED ORDER — FREESTYLE LIBRE 14 DAY SENSOR MISC: 1.0000 | 3 refills | Status: DC

## 2019-06-15 NOTE — Patient Outreach (Signed)
[facility]) Care Management [facility] CM Telephone Outreach Unsuccessful (consecutive) Outreach attempt # 1- previously engaged patient  06/15/2019  Alan Schmidt 09/03/1945 [id number]  Unsuccessfultelephone outreach attemptto [name], 74 y/o male referred to [place] Pharmacist embedded in patient's PCP officefor possible community resource needs in dealing with chronic depression. 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  HIPAA compliant voice mail message left for patient, requesting return call back.  Plan:  Will re-attempt [facility] CM telephone outreach within 4 business days if I do not hear back from patient first  Oneta Rack, RN, BSN, [employer] Coordinator [facility] Care Management  [phone]

## 2019-06-15 NOTE — Chronic Care Management (AMB) (Signed)
  Chronic Care Management   Note  06/15/2019 Name: Alan Schmidt MRN: [id number] DOB: 1945/06/03  [name] is a 74 y.o. year old male who is a primary care patient of [name], [name], MD. The CCM team was consulted for assistance with chronic disease management and care coordination needs.    Patient calls to report that he needs to replace 2 sensors that popped off. He has an email from [place], but isn't sure what to do with it to be able to get the replacement sensors. I provided him the phone number for [employer] to see if they could provide the replacement sensors [phone]).   Patient called back, noted that they could not bill appropriately to provide replacements. I have sent Libre sensor prescriptions to the local [facility] for patient to take the replacement billing info from [employer] to [facility] to pick up.   [name], PharmD, [place], CPP Clinical Pharmacist [place] [phone]

## 2019-06-15 NOTE — Telephone Encounter (Signed)
Patient lvmail stating he was supposed to have referral to [facility] PT from last appt. Here. I cannot find a referral. Please check with Dr. Dossie Arbour and let patient know status.

## 2019-06-15 NOTE — Telephone Encounter (Signed)
Patient had  a PT order from January and I called patient to see if that is the one he is referring to. No answer and no way to leave VM.

## 2019-06-19 ENCOUNTER — Encounter: Payer: Self-pay | Admitting: *Deleted

## 2019-06-19 ENCOUNTER — Other Ambulatory Visit: Payer: Self-pay | Admitting: *Deleted

## 2019-06-19 NOTE — Patient Outreach (Signed)
Alan Schmidt) Care Management [place] Telephone Outreach  06/19/2019  Alan Schmidt Apr 22, 1945 [id number]  Initially unsuccessful outreach attempt; left patient voice message requesting call back, and he immediately retiurned my call  2:55 pm:  Successfultelephone outreach with Alan Schmidt, 74 y/o male referred to Alan Schmidt embedded in patient's PCP officefor possible community resource needs in dealing with chronic depression. 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  HIPAA/ identity verified;patientreportstoday"doing much," and he reports that he believes his blood sugars at home "have stabilized and are much better;" states he has had no further concerns around low blood sugars since our last outreach.  Patient sounds to be in no distress throughout our phone call today.  Patient further reports:  -- missed previously scheduled visit with Diabetes Education Schmidt, Jun 13, 2019: reports he "doesn't remember the exact" reason he had to cancel the appointment, but he assures me that he plans to contact to re-schedule "this afternoon;" this was encouraged and we again discussed the benefit of having an expert in DM work with him  -- is aware of and has plans to attend upcoming PCP and Alan Schmidt appointment on Jun 26, 2019  -- continues to drive self; SDOH updated for depression (patient reports "better"); transportation; and food insecurity: no concerns identified today  -- remains able to verbalize several dietary strategies he has taken to improve his diet for self-health management of DM; we reviewed previously provided education around dietary strategies for ongoing self-health management of DM and he is able to accurately verbalize many strategies  with minimal prompting  -- no concerns/ issues/ questions around current medications: continues self-managing all aspects of medications  Patient denies further issues, concerns, or problems today. Iconfirmed patient hasmy direct phone number, the main Alan Schmidt office phone number, and the [facility] CM 24-hour nurse advice phone number should issues arise prior to next scheduled [facility] CM outreachnext month. Discussed with patient that he has thus far made excellent progress in meeting his previously established Alan Schmidt CM goals; discussed possibility of transfer to Alan Schmidt and explained Alan Schmidt role to patient; he is agreeable to this general plan.  Encouraged patient to contact me directly if needs, questions, issues, or concerns arise prior to next scheduled outreach; patient agreed to do so.  Plan:  Patient will take medications as prescribed and will attend all scheduled provider appointments  Patient will promptly notify care providers for any new concerns/ issues/ problems that arise  Patient will continue monitoring/ recordingblood sugars at home using [name] CGM system  Patient will maintain engagement with Alan Schmidt Schmidt embedded at his PCP office  Patient will promptly re-schedule missed appointment with Diabetes Educator as per previous referral by PCP  Patient will continue using food journal and will take to scheduled appointment with nutritionist for formal diabetes education  [facility] CM outreach to continue with scheduled phone call within 3 weeks, post- upcoming scheduled provider appointments, possibly for transfer to [place] Problem One     Most Recent Value  Care Plan Problem One  Self-health management of chronic disease state of Diabetes in patient with depression as evidenced by patient reporting  Role Documenting the Problem One  Care Management Alan for  Problem One  Active   [facility] Long Term Goal   Over the next 60 days, patient will verbalize 3 dietary strategies for blood sugar management as evidenced by patient reporting during [facility] [place] RN CM outreach  [place] Term Goal Start Date  05/15/19  Interventions for Problem One Long Term Goal  Confirmed with patient that he has continued to make small changes to dietary strategies to follow prescribed diet for DM,  using teach back method, confirmed that patient understands general allowances for carbohydrates for each meal,  confirmed that patient has not experienced further/ ongoing hypoglycemic episodes and believes that his recent blood sugars at home have stabilized  [facility] CM Short Term Goal #1   Over the next 30 days, patient will continue actively working with Alan Schmidt and will discuss his current medication regimen for medications for control of DM, as evidenced by patient reporting and collaboration with [place] Schmidt as indicated during [facility] RN CM outreach  [place] [facility] CM Short Term Goal #1 Start Date  05/15/19  [facility] CM Short Term Goal #1 Met Date  06/19/19 [Goal Met]  Interventions for Short Term Goal #1  Confirmed that patient continues to work with [place] Schmidt for ongoing optimization of medication management,  confirmed that he is aware of upcoming scheduled appointment with PCP and [place] [facility] Schmidt on Monday May 24, and has plans to attend as scheduled  [facility] CM Short Term Goal #2   Over the next 18 days, patient will fully engage with nutritionist as referred by PCP around formal diabetes education, as evidenced by patient reporting and collaboration with nutritionist as indicated during [facility] [place] RN CM outreach [Goal modified/ extended]  [facility] CM Short Term Goal #2 Start Date  06/19/19  Interventions for Short Term Goal #2  Discussed with patient his recent missed appointment with the Diabetes Education [facility] Schmidt,  encouraged patient to promptly re-schedule this appointment,  discussed benefits of having formal expert  Diabetes educator intervention,  discussed possibility of [facility] CM transfer to [place] Schmidt in future     Oneta Rack, RN, BSN, [employer] Coordinator [facility] Care Management  [phone]

## 2019-06-20 ENCOUNTER — Telehealth: Payer: Medicare Other | Admitting: Pain Medicine

## 2019-06-21 ENCOUNTER — Encounter: Payer: Self-pay | Admitting: *Deleted

## 2019-06-21 ENCOUNTER — Encounter: Payer: Medicare Other | Attending: Family Medicine | Admitting: *Deleted

## 2019-06-21 ENCOUNTER — Other Ambulatory Visit: Payer: Self-pay

## 2019-06-21 VITALS — BP 110/78 | Ht 73.0 in | Wt 267.4 lb

## 2019-06-21 DIAGNOSIS — E1142 Type 2 diabetes mellitus with diabetic polyneuropathy: Secondary | ICD-10-CM | POA: Insufficient documentation

## 2019-06-21 DIAGNOSIS — Z794 Long term (current) use of insulin: Secondary | ICD-10-CM | POA: Diagnosis not present

## 2019-06-21 NOTE — Patient Instructions (Signed)
Check blood sugars 4 x day before each meal and before bed every day and as needed with FreeStyle Libre Bring blood sugar records to the next class  Exercise:  Walk as tolerated  Eat 3 meals day,  1-2 snacks a day Space meals 4-6 hours apart Don't skip meals Avoid sugar sweetened drinks (juices) unless treating a low blood sugar  Carry fast acting glucose and a snack at all times Rotate injection sites  Return for classes on:

## 2019-06-22 ENCOUNTER — Telehealth: Payer: Self-pay

## 2019-06-22 ENCOUNTER — Telehealth: Payer: Self-pay | Admitting: Family Medicine

## 2019-06-22 NOTE — Telephone Encounter (Signed)
Pt called and wanted know If he needed to keep his appt with Dr. [name] and [name] on Monday or go to his Dermatology appt

## 2019-06-22 NOTE — Telephone Encounter (Signed)
LVM informing the patient to keep his appt with the provider and the pharmacist and to reschedule his dermatology appt to a later date.  Salihah Peckham,cma

## 2019-06-22 NOTE — Progress Notes (Signed)
Diabetes Self-Management Education  Visit Type: First/Initial  Appt. Start Time: 1020 Appt. End Time: [id number]  06/21/2019  [name], identified by name and date of birth, is a 74 y.o. male with a diagnosis of Diabetes: Type 2.   ASSESSMENT  Blood pressure 110/78, height 6\' 1"  (1.854 m), weight 267 lb 6.4 oz (121.3 kg). Body mass index is 35.28 kg/m.  Diabetes Self-Management Education - 06/21/19 1202      Visit Information   Visit Type  First/Initial      Initial Visit   Diabetes Type  Type 2    Are you currently following a meal plan?  Yes    What type of meal plan do you follow?  "eating less, making better choices"    Are you taking your medications as prescribed?  Yes    Date Diagnosed  10 + years ago      Health Coping   How would you rate your overall health?  Good      Psychosocial Assessment   Patient Belief/Attitude about Diabetes  Motivated to manage diabetes    Self-care barriers  Unsteady gait/risk for falls    Self-management support  Doctor's office    Patient Concerns  Nutrition/Meal planning;Medication;Monitoring;Healthy Lifestyle;Problem Solving;Glycemic Control;Weight Control    Special Needs  None    Preferred Learning Style  Visual    Learning Readiness  Change in progress    How often do you need to have someone help you when you read instructions, pamphlets, or other written materials from your doctor or pharmacy?  1 - Never    What is the last grade level you completed in school?  Doctor of law degree      Pre-Education Assessment   Patient understands the diabetes disease and treatment process.  Needs Review    Patient understands incorporating nutritional management into lifestyle.  Needs Review    Patient undertands incorporating physical activity into lifestyle.  Needs Review    Patient understands using medications safely.  Needs Review    Patient understands monitoring blood glucose, interpreting and using results  Needs Review     Patient understands prevention, detection, and treatment of acute complications.  Needs Review    Patient understands prevention, detection, and treatment of chronic complications.  Needs Review    Patient understands how to develop strategies to address psychosocial issues.  Needs Review    Patient understands how to develop strategies to promote health/change behavior.  Needs Review      Complications   Last HgB A1C per patient/outside source  8.4 %   05/15/2019   How often do you check your blood sugar?  > 4 times/day   Pt has FreeStyle Libre CGM and checks blood sugars multiple times per day. Time in [place] in the last week 96 %. Average glucose 134 mg/dL.   Have you had a dilated eye exam in the past 12 months?  Yes    Have you had a dental exam in the past 12 months?  Yes    Are you checking your feet?  Yes    How many days per week are you checking your feet?  3      Dietary Intake   Breakfast  skips breakfast    Snack (morning)  sometimes has a snack at 3 am - microwave meals or eats sandwich with meat and 1 piece of bread    Lunch  microwave meals - Smart ones, [employer] - 50-52 grams of carbs;  has apple sauce    Dinner  same as Environmental education officer)  water, fruit juuice, Crystal light      Exercise   Exercise Type  ADL's      Patient Education   Previous Diabetes Education  No    Disease state   Definition of diabetes, type 1 and 2, and the diagnosis of diabetes;Factors that contribute to the development of diabetes;Explored patient's options for treatment of their diabetes    Nutrition management   Role of diet in the treatment of diabetes and the relationship between the three main macronutrients and blood glucose level;Food label reading, portion sizes and measuring food.;Carbohydrate counting;Reviewed blood glucose goals for pre and post meals and how to evaluate the patients' food intake on their blood glucose level.;Meal timing in regards to the patients' current  diabetes medication.    Physical activity and exercise   Role of exercise on diabetes management, blood pressure control and cardiac health.    Medications  Taught/reviewed insulin injection, site rotation, insulin storage and needle disposal.;Reviewed patients medication for diabetes, action, purpose, timing of dose and side effects.    Monitoring  Purpose and frequency of SMBG.;Taught/discussed recording of test results and interpretation of SMBG.;Identified appropriate SMBG and/or A1C goals.    Acute complications  Taught treatment of hypoglycemia - the 15 rule.    Chronic complications  Relationship between chronic complications and blood glucose control    Psychosocial adjustment  Identified and addressed patients feelings and concerns about diabetes      Individualized Goals (developed by patient)   Reducing Risk  Other (comment)   improve blood sugars, decrease medications, prevent diabetes complications, lose weight, lead a healthier lifestyle, become more fit     Outcomes   Expected Outcomes  Demonstrated interest in learning. Expect positive outcomes    Future DMSE  2 wks       Individualized Plan for Diabetes Self-Management Training:   Learning Objective:  Patient will have a greater understanding of diabetes self-management. Patient education plan is to attend individual and/or group sessions per assessed needs and concerns.   Plan:   Patient Instructions  Check blood sugars 4 x day before each meal and before bed every day and as needed with FreeStyle Libre Bring blood sugar records to the next class Exercise:  Walk as tolerated Eat 3 meals day,  1-2 snacks a day Space meals 4-6 hours apart Don't skip meals Avoid sugar sweetened drinks (juices) unless treating a low blood sugar Carry fast acting glucose and a snack at all times Rotate injection sites  Expected Outcomes:  Demonstrated interest in learning. Expect positive outcomes  Education material provided:   General Meal Planning Guidelines Simple Meal Plan Glucose tablets Symptoms, causes and treatments of Hypoglycemia  If problems or questions, patient to contact team via:  Johny Drilling, RN, [place] [phone]  Future DSME appointment: 2 wks  July 06, 2019 for Diabetes classes

## 2019-06-26 ENCOUNTER — Encounter: Payer: Self-pay | Admitting: Family Medicine

## 2019-06-26 ENCOUNTER — Ambulatory Visit: Payer: Medicare Other | Admitting: Pharmacist

## 2019-06-26 ENCOUNTER — Ambulatory Visit (INDEPENDENT_AMBULATORY_CARE_PROVIDER_SITE_OTHER): Payer: Medicare Other | Admitting: Family Medicine

## 2019-06-26 ENCOUNTER — Other Ambulatory Visit: Payer: Self-pay

## 2019-06-26 DIAGNOSIS — E1142 Type 2 diabetes mellitus with diabetic polyneuropathy: Secondary | ICD-10-CM | POA: Diagnosis not present

## 2019-06-26 DIAGNOSIS — G894 Chronic pain syndrome: Secondary | ICD-10-CM | POA: Diagnosis not present

## 2019-06-26 DIAGNOSIS — Z794 Long term (current) use of insulin: Secondary | ICD-10-CM

## 2019-06-26 DIAGNOSIS — L989 Disorder of the skin and subcutaneous tissue, unspecified: Secondary | ICD-10-CM | POA: Diagnosis not present

## 2019-06-26 DIAGNOSIS — F419 Anxiety disorder, unspecified: Secondary | ICD-10-CM

## 2019-06-26 DIAGNOSIS — F329 Major depressive disorder, single episode, unspecified: Secondary | ICD-10-CM

## 2019-06-26 DIAGNOSIS — F32A Depression, unspecified: Secondary | ICD-10-CM

## 2019-06-26 MED ORDER — IBUPROFEN 800 MG PO TABS: 800.0000 mg | ORAL_TABLET | Freq: Three times a day (TID) | ORAL | 0 refills | Status: DC | PRN

## 2019-06-26 NOTE — Assessment & Plan Note (Addendum)
Seems to be well controlled.  He will continue to monitor.  Continue current regimen.  Check labs in about 6 weeks.

## 2019-06-26 NOTE — Assessment & Plan Note (Signed)
Discussed he could take ibuprofen several days a week as needed.  He should not take the meloxicam the same day.  Advised to take this with food.  Check kidney function with next set of labs.

## 2019-06-26 NOTE — Telephone Encounter (Signed)
Patient received my message on VM and came in to his appointment today.  Layliana Devins,cma

## 2019-06-26 NOTE — Assessment & Plan Note (Signed)
Encouraged him to see dermatology as scheduled.

## 2019-06-26 NOTE — Chronic Care Management (AMB) (Signed)
Chronic Care Management   Follow Up Note   06/26/2019 Name: Alan Schmidt MRN: [id number] DOB: 1945/07/20  Referred by: [name], MD Reason for referral : Chronic Care Management (Medication Management)   Alan Schmidt is a 74 y.o. year old male who is a primary care patient of [name], [name], MD. The CCM team was consulted for assistance with chronic disease management and care coordination needs.    Met with patient face to face prior to PCP visit.  Review of patient status, including review of consultants reports, relevant laboratory and other test results, and collaboration with appropriate care team members and the patient's provider was performed as part of comprehensive patient evaluation and provision of chronic care management services.    SDOH (Social Determinants of Health) assessments performed: Yes See Care Plan activities for detailed interventions related to SDOH)  SDOH Interventions     Most Recent Value  SDOH Interventions  Financial Strain Interventions  Other (Comment) [Care Guide referral]       Outpatient Encounter Medications as of 06/26/2019  Medication Sig Note  . acetaminophen (TYLENOL) 500 MG tablet Take 500 mg by mouth every 6 (six) hours as needed. 06/05/2019: Taking PRN, up to a couple times daily   . ALPRAZolam (XANAX) 1 MG tablet Take 0.5 tablets (0.5 mg total) by mouth 2 (two) times daily as needed for anxiety.   Marland Kitchen amLODipine (NORVASC) 5 MG tablet TAKE 1 TABLET BY MOUTH EVERY DAY   . aspirin EC 81 MG tablet Take 81 mg by mouth daily.   Marland Kitchen buPROPion (WELLBUTRIN XL) 150 MG 24 hr tablet Take 1 tablet (150 mg total) by mouth daily for 7 days, THEN 2 tablets (300 mg total) daily. 06/05/2019: 300 mg daily   . carvedilol (COREG) 25 MG tablet TAKE 1 TABLET BY MOUTH TWICE A DAY   . Continuous Blood Gluc Receiver (FREESTYLE LIBRE 14 DAY READER) DEVI 1 Device by Does not apply route daily. Use to scan to check blood sugar up to 7 times daily;  E11.42,   . Continuous Blood Gluc Sensor (FREESTYLE LIBRE 14 DAY SENSOR) MISC 1 Device by Does not apply route every 14 (fourteen) days. E11.42   . escitalopram (LEXAPRO) 20 MG tablet TAKE 1 TABLET BY MOUTH EVERY DAY   . hydrochlorothiazide (HYDRODIURIL) 25 MG tablet TAKE 1 TABLET BY MOUTH EVERY DAY   . insulin lispro (HUMALOG KWIKPEN) 100 UNIT/ML KwikPen INJECT 7-10 units INTO THE SKIN three times daily with a meal 06/26/2019: 1-10 units, generally ~7 units  . JARDIANCE 25 MG TABS tablet TAKE 1 TABLET BY MOUTH EVERY DAY   . loratadine (CLARITIN) 10 MG tablet TAKE 1 TABLET BY MOUTH EVERY DAY   . lubiprostone (AMITIZA) 8 MCG capsule Take 1 capsule (8 mcg total) by mouth 2 (two) times daily with a meal. Swallow the medication whole. Do not break or chew the medication. 06/05/2019: PRN 2-3 times weekly   . meloxicam (MOBIC) 15 MG tablet Take 1 tablet (15 mg total) by mouth daily.   . metFORMIN (GLUCOPHAGE) 1000 MG tablet TAKE 1 TABLET BY MOUTH TWICE A DAY WITH A MEAL   . olmesartan (BENICAR) 40 MG tablet TAKE 1 TABLET BY MOUTH EVERY DAY   . Oxycodone HCl 10 MG TABS Take 1 tablet (10 mg total) by mouth every 6 (six) hours as needed. Must last 30 days   . pantoprazole (PROTONIX) 40 MG tablet TAKE 1 TABLET BY MOUTH EVERY DAY   . pravastatin (  PRAVACHOL) 40 MG tablet TAKE 1 TABLET BY MOUTH EVERY DAY   . sennosides-docusate sodium (SENOKOT-S) 8.6-50 MG tablet Take 1 tablet by mouth daily as needed.    . tamsulosin (FLOMAX) 0.4 MG CAPS capsule TAKE 1 CAPSULE (0.4 MG TOTAL) BY MOUTH 2 (TWO) TIMES DAILY.   Marland Kitchen TRESIBA FLEXTOUCH 100 UNIT/ML FlexTouch Pen INJECT 40 UNITS INTO THE SKIN DAILY AT 10 PM. 06/26/2019: 25 units  . VICTOZA 18 MG/3ML SOPN INJECT 1.8 MG UNDER THE SKIN ONCE DAILY   . desoximetasone (TOPICORT) 0.25 % cream APPLY CREAM TO AFFECTED AREA TWO TIMES DAILY, FOR UP TO 7 DAYS, DO NOT APPLY TO FACE   . Insulin Pen Needle (BD PEN NEEDLE NANO U/F) 32G X 4 MM MISC USE EVERY DAY   . mometasone (NASONEX) 50  MCG/ACT nasal spray INSTILL TWO SPRAYS INTO THE NOSE DAILY   . naloxone (NARCAN) 2 MG/2ML injection Inject 1 mL (1 mg total) into the muscle as needed for up to 2 doses (for emergency use only). Always have available. Inject into thigh muscle and call 911   . [START ON 06/29/2019] Oxycodone HCl 10 MG TABS Take 1 tablet (10 mg total) by mouth every 6 (six) hours as needed. Must last 30 days   . [START ON 07/29/2019] Oxycodone HCl 10 MG TABS Take 1 tablet (10 mg total) by mouth every 6 (six) hours as needed. Must last 30 days   . Wheat Dextrin (BENEFIBER) POWD Take 6 g by mouth 3 (three) times daily before meals. (2 tsp = 6 g)    No facility-administered encounter medications on file as of 06/26/2019.     Objective:                 Goals Addressed            This Visit's Progress     Patient Stated   . "I want to keep an eye on my diabetes" (pt-stated)       Current Barriers:  . Diabetes: uncontrolled though improved per CGM results; most recent A1c 8.4% o Reports significant focus on diet since this A1c result. Has met w/ diabetes educator. Has cut back on portion sizes, and is watching carbohydrates more  . Current antihyperglycemic regimen: metformin 1000 mg BID, Jardiance 25 mg daily, Victoza 1.8 mg daily, Tresiba 25 units daily, Humalog 7-10 units based upon expected carbohydrates; if low, taking 1-3 units. Working with dietician on Architect . Does report some episodes of lows, this morning was 55, but treated appropriately.  . Current blood glucose readings: see above o Date of Download: 5/11-5/24 o % Time CGM is active: 96% o Average Glucose: 126 o Glucose Variability: 20.9 (goal <36%) o Time in Goal:  o - Time in range 70-180: 97% o - Time above range: 2% o - Time below range: 1% . Cardiovascular risk reduction: o Current hypertensive regimen: amlodipine 5 mg daily, carvedilol 25 mg BID, HCTZ 25 mg daily, olmesartan 40 mg daily;  o Current  hyperlipidemia regimen: pravastatin 40 mg daily; LDL well controlled last check <70 . Mental health concerns: escitalopram 20 mg daily, alprazolam 0.5 mg PRN; bupropion XL 300 mg daily . Chronic pain: Oxycodone 10 mg 6H PRN, meloxicam 15 mg daily; follows w/ pain management. Notes some concerns with how he is going to get to his next procedure if he doesn't have someone to drive him. Also notes that he has a difficult time with chores around his house  Pharmacist  Clinical Goal(s):  Marland Kitchen Over the next 90 days, patient with work with PharmD and primary care provider to address optimized diabetes management  Interventions: . Comprehensive medication review performed, medication list updated in electronic medical record . Inter-disciplinary care team collaboration (see longitudinal plan of care) . Continue current regimen of Tresiba 25 units daily and Humalog 1-10 units based on meals. Continue education w/ dietician. Praised patient for recent weight loss.  . Patient gave me permission to pass glucose readings along to dietician prior to next appointment. Will route note to her today to inform. . Care Guide referral placed for transportation resources and possible PCS connection  Patient Self Care Activities:  . Patient will check blood glucose with CGM, document, and provide at future appointment . Patient will take medications as prescribed . Patient will report any questions or concerns to provider   Please see past updates related to this goal by clicking on the "Past Updates" button in the selected goal          Plan:  - Scheduled f/u call in ~ 8 weeks  [name], PharmD, [place], [place] Pharmacist [place] [phone]

## 2019-06-26 NOTE — Progress Notes (Signed)
Alan Rumps, MD Phone: [phone]  Alan Schmidt is a 74 y.o. male who presents today for f/u.  DIABETES Disease Monitoring: Blood Sugar ranges-98% within range recently Polyuria/phagia/dipsia- no      Optho- UTD Medications: Compliance- taking humalog, tresiba, victoza, metformin Hypoglycemic symptoms- this am was in the 50s as he did not eat enough yesterday He has seen the nutritionist and notes that has been helpful.  Anxiety/depression: Notes he is doing quite a bit better with this.  Has improved now that he has less pain.  Taking Wellbutrin and Lexapro.  Also takes Xanax.  No SI.  Chronic pain: Patient notes meloxicam has not been quite as effective.  He tried some ibuprofen yesterday which was beneficial.  He also takes oxycodone for pain management.  He notes he spoke with his pain management specialist and they noted that he could take the ibuprofen a few times a week as needed though he should not take at the same day as meloxicam.   Social History   Tobacco Use  Smoking Status Former Smoker  Smokeless Tobacco Never Used     ROS see history of present illness  Objective  Physical Exam Vitals:   06/26/19 0916  BP: 118/80  Pulse: 93  SpO2: 95%    BP Readings from Last 3 Encounters:  06/26/19 118/80  06/21/19 110/78  05/15/19 140/80   Wt Readings from Last 3 Encounters:  06/26/19 263 lb 12.8 oz (119.7 kg)  06/21/19 267 lb 6.4 oz (121.3 kg)  05/15/19 271 lb 9.6 oz (123.2 kg)    Physical Exam Constitutional:      General: He is not in acute distress.    Appearance: He is not diaphoretic.  Cardiovascular:     Rate and Rhythm: Normal rate and regular rhythm.     Heart sounds: Normal heart sounds.  Pulmonary:     Effort: Pulmonary effort is normal.     Breath sounds: Normal breath sounds.  Musculoskeletal:     Right lower leg: No edema.     Left lower leg: No edema.  Skin:    General: Skin is warm and dry.  Neurological:     Mental Status:  He is alert.    Diabetic Foot Exam - Simple   Simple Foot Form Diabetic Foot exam was performed with the following findings: Yes 06/26/2019  9:34 AM  Visual Inspection No deformities, no ulcerations, no other skin breakdown bilaterally: Yes Sensation Testing See comments: Yes Pulse Check Posterior Tibialis and Dorsalis pulse intact bilaterally: Yes Comments Does have a benign-appearing nevus on the bottom of his right foot, he notes he saw podiatry recently and they advised that it looked normal, slight decreased monofilament testing in his right forefoot related to a prior surgery       Assessment/Plan: Please see individual problem list.  Diabetic peripheral neuropathy (HCC) Chronic and stable.  He will monitor.  Diabetes ([place]) Seems to be well controlled.  He will continue to monitor.  Continue current regimen.  Check labs in about 6 weeks.  Skin lesion Encouraged him to see dermatology as scheduled.  Chronic pain syndrome Discussed he could take ibuprofen several days a week as needed.  He should not take the meloxicam the same day.  Advised to take this with food.  Check kidney function with next set of labs.   Health Maintenance: notes he received the moderna vaccine about 1.5 months ago.   No orders of the defined types were placed in this encounter.  Meds ordered this encounter  Medications  . ibuprofen (ADVIL) 800 MG tablet    Sig: Take 1 tablet (800 mg total) by mouth every 8 (eight) hours as needed for moderate pain.    Dispense:  20 tablet    Refill:  0    This visit occurred during the SARS-CoV-2 public health emergency.  Safety protocols were in place, including screening questions prior to the visit, additional usage of staff PPE, and extensive cleaning of exam room while observing appropriate contact time as indicated for disinfecting solutions.    Alan Rumps, MD [place]

## 2019-06-26 NOTE — Patient Instructions (Signed)
Nice to see you. We will get labs in about 6 weeks. Please try the ibuprofen.  Please limit this to 2 to 3 days a week and do not take the meloxicam with it.  Please take this with food.   Please continue to work on diet.

## 2019-06-26 NOTE — Patient Instructions (Signed)
Visit Information  Goals Addressed            This Visit's Progress     Patient Stated   . "I want to keep an eye on my diabetes" (pt-stated)       Current Barriers:  . Diabetes: uncontrolled though improved per CGM results; most recent A1c 8.4% o Reports significant focus on diet since this A1c result. Has met w/ diabetes educator. Has cut back on portion sizes, and is watching carbohydrates more  . Current antihyperglycemic regimen: metformin 1000 mg BID, Jardiance 25 mg daily, Victoza 1.8 mg daily, Tresiba 25 units daily, Humalog 7-10 units based upon expected carbohydrates; if low, taking 1-3 units. Working with dietician on [place] . Does report some episodes of lows, this morning was 55, but treated appropriately.  . Current blood glucose readings: see above o Date of Download: 5/11-5/24 o % Time CGM is active: 96% o Average Glucose: 126 (average over the previous few months has been 140-160s) o Glucose Variability: 20.9 (goal <36%) o Time in Goal:  o - Time in range 70-180: 97% o - Time above range: 2% o - Time below range: 1% . Cardiovascular risk reduction: o Current hypertensive regimen: amlodipine 5 mg daily, carvedilol 25 mg BID, HCTZ 25 mg daily, olmesartan 40 mg daily;  o Current hyperlipidemia regimen: pravastatin 40 mg daily; LDL well controlled last check <70 . Mental health concerns: escitalopram 20 mg daily, alprazolam 0.5 mg PRN; bupropion XL 300 mg daily . Chronic pain: Oxycodone 10 mg 6H PRN, meloxicam 15 mg daily; follows w/ pain management. Notes some concerns with how he is going to get to his next procedure if he doesn't have someone to drive him. Also notes that he has a difficult time with chores around his house  Pharmacist Clinical Goal(s):  Marland Kitchen Over the next 90 days, patient with work with PharmD and primary care provider to address optimized diabetes management  Interventions: . Comprehensive medication review performed, medication  list updated in electronic medical record . Inter-disciplinary care team collaboration (see longitudinal plan of care) . Continue current regimen of Tresiba 25 units daily and Humalog 1-10 units based on meals. Continue education w/ dietician. Praised patient for recent weight loss.  . Patient gave me permission to pass glucose readings along to dietician prior to next appointment. Will route note to her today to inform. . Care Guide referral placed for transportation resources and possible PCS connection  Patient Self Care Activities:  . Patient will check blood glucose with CGM, document, and provide at future appointment . Patient will take medications as prescribed . Patient will report any questions or concerns to provider   Please see past updates related to this goal by clicking on the "Past Updates" button in the selected goal         Patient verbalizes understanding of instructions provided today.    Plan:  - Scheduled f/u call in ~ 8 weeks  [name], PharmD, [place], [facility] Pharmacist [place] [phone]

## 2019-06-26 NOTE — Assessment & Plan Note (Signed)
Chronic and stable.  He will monitor.

## 2019-06-29 DIAGNOSIS — L821 Other seborrheic keratosis: Secondary | ICD-10-CM | POA: Diagnosis not present

## 2019-06-29 DIAGNOSIS — D2271 Melanocytic nevi of right lower limb, including hip: Secondary | ICD-10-CM | POA: Diagnosis not present

## 2019-06-29 DIAGNOSIS — L820 Inflamed seborrheic keratosis: Secondary | ICD-10-CM | POA: Diagnosis not present

## 2019-06-29 DIAGNOSIS — D229 Melanocytic nevi, unspecified: Secondary | ICD-10-CM | POA: Diagnosis not present

## 2019-07-01 IMAGING — CT CT RENAL STONE PROTOCOL
2 of 4 series · 16 of 46 positions shown, 18 images · non-contrast
Comparison: None.

CLINICAL DATA: LEFT flank pain.

EXAM:
CT ABDOMEN AND PELVIS WITHOUT CONTRAST
TECHNIQUE: Multidetector CT imaging of the abdomen and pelvis was performed
following the standard protocol without IV contrast.

[Series 2: stone full standard (person_name) · axial · 0.84mm/px · z∈[-954,-464]mm · 13 of 108 slices shown, 15 images]
[im 5/108  soft-tissue]
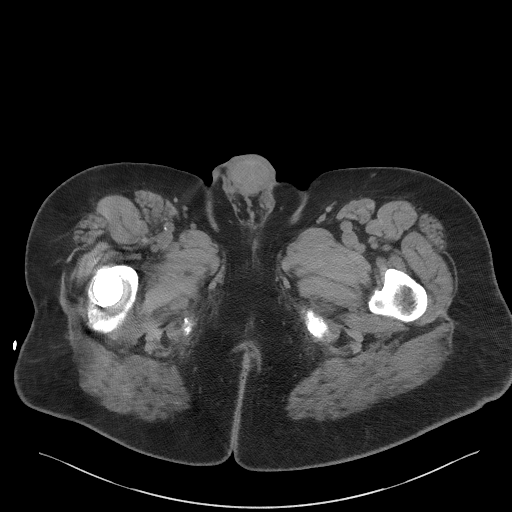
[im 5/108  bone]
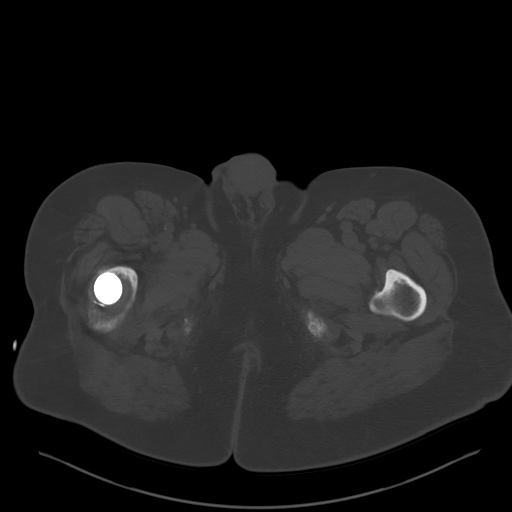
[im 14/108  soft-tissue]
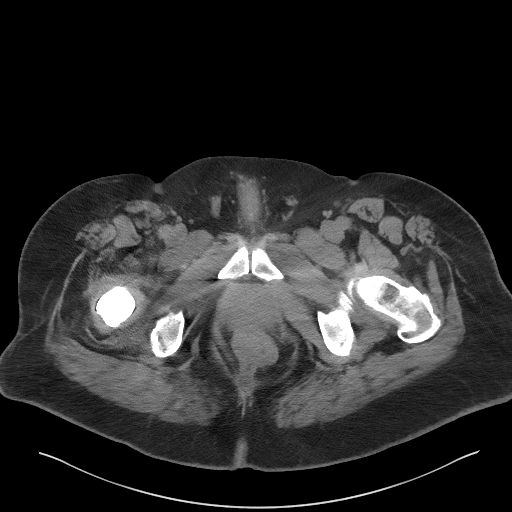
[im 24/108  soft-tissue]
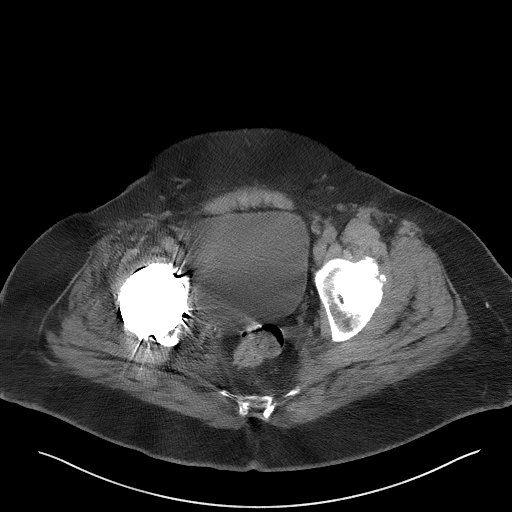
[im 28/108  soft-tissue]
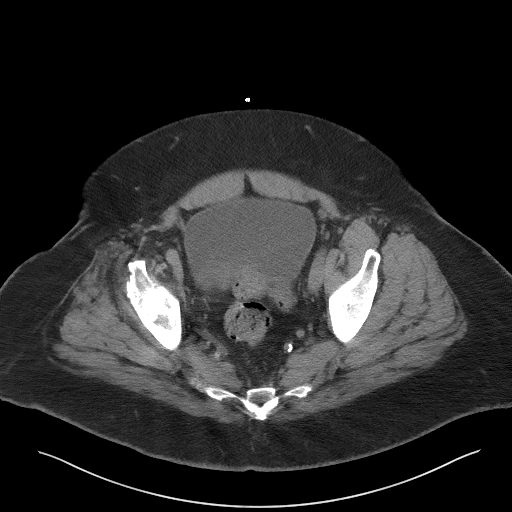
[im 38/108  soft-tissue]
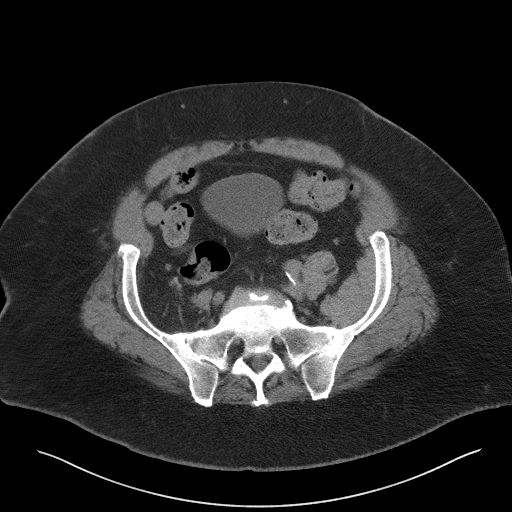
[im 47/108  soft-tissue]
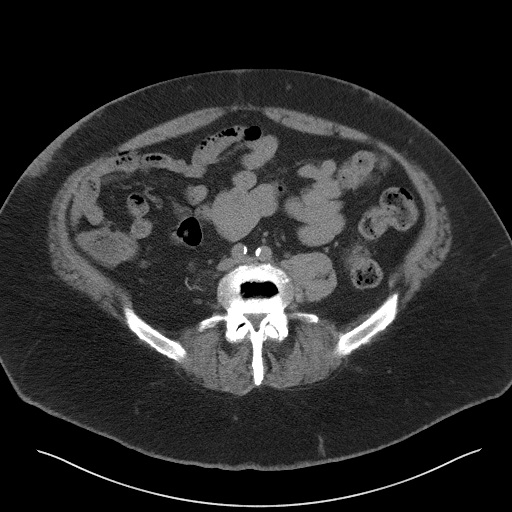
[im 56/108  soft-tissue]
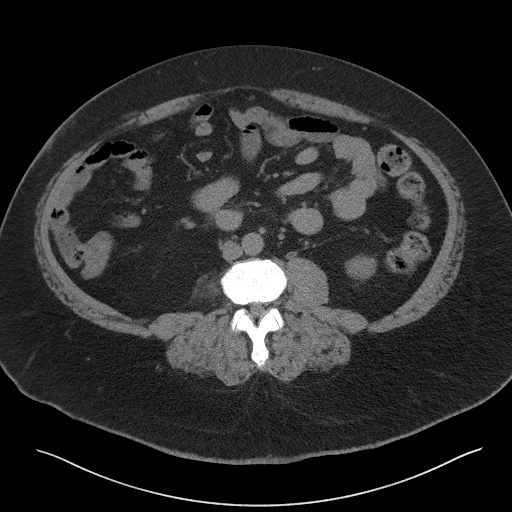
[im 61/108  soft-tissue]
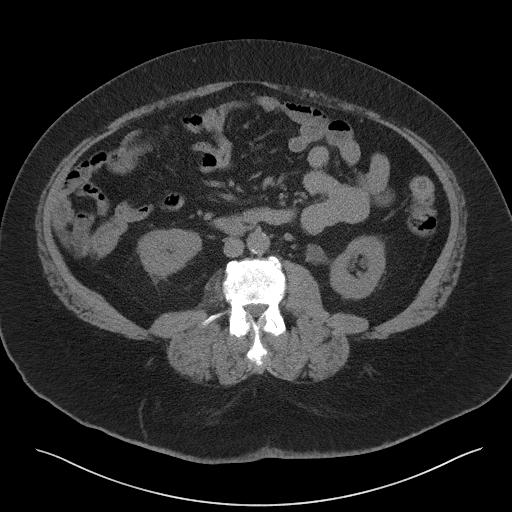
[im 70/108  soft-tissue]
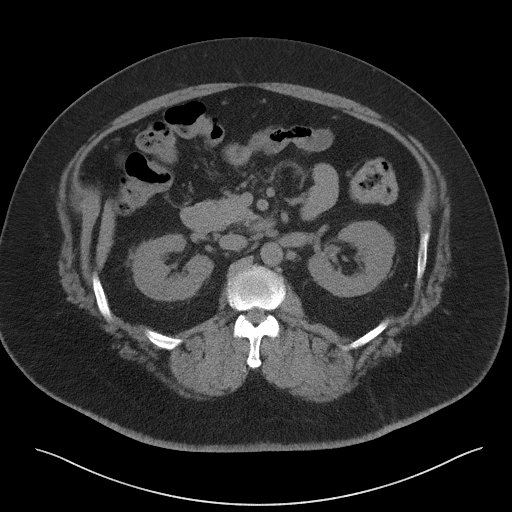
[im 70/108  bone]
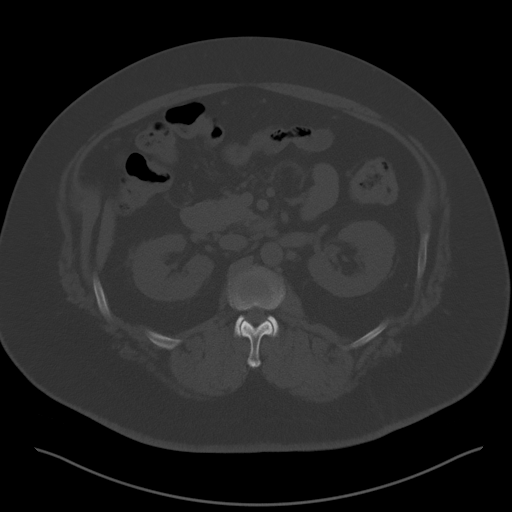
[im 80/108  soft-tissue]
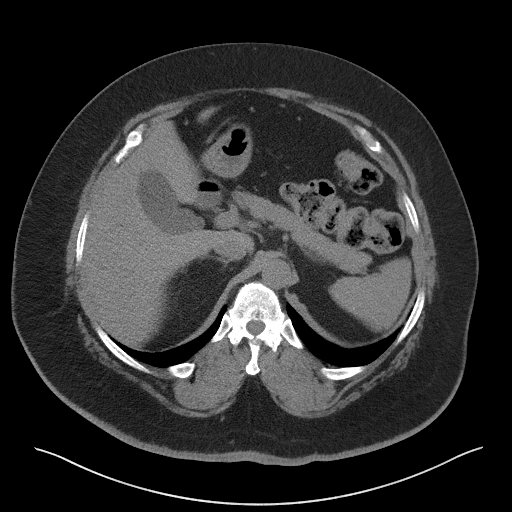
[im 84/108  soft-tissue]
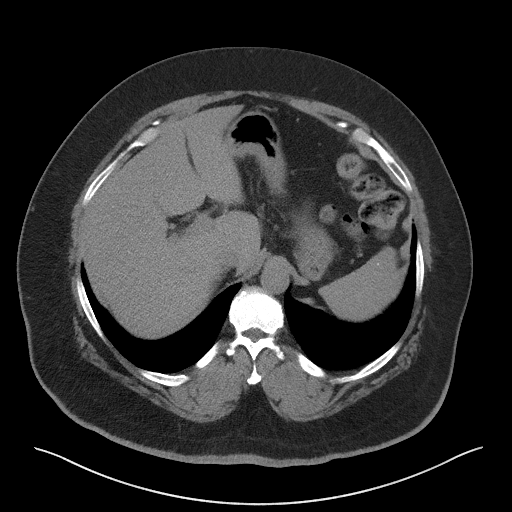
[im 94/108  soft-tissue]
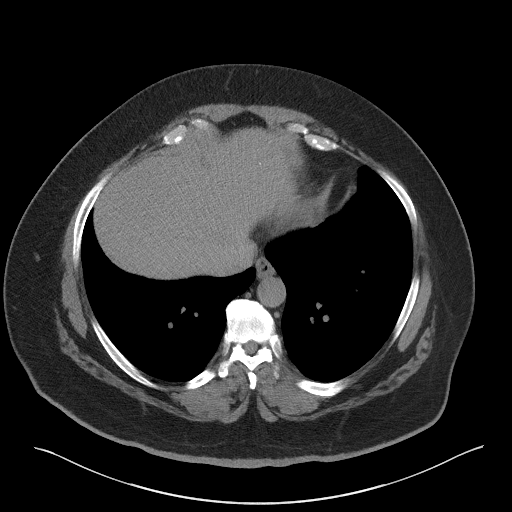
[im 103/108  soft-tissue]
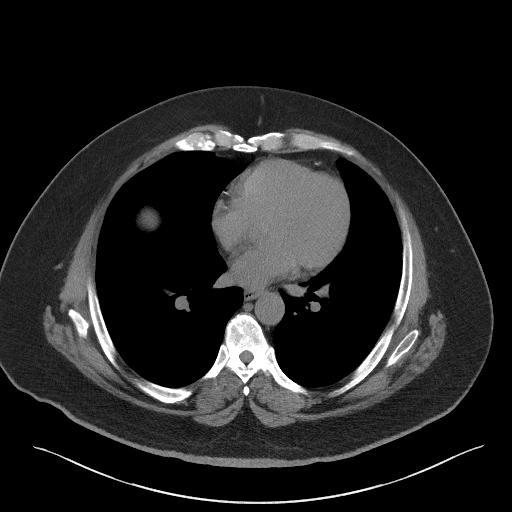

[Series 5: coronal · coronal · 0.90mm/px · 3 of 152 slices shown]
[im 51/152  soft-tissue]
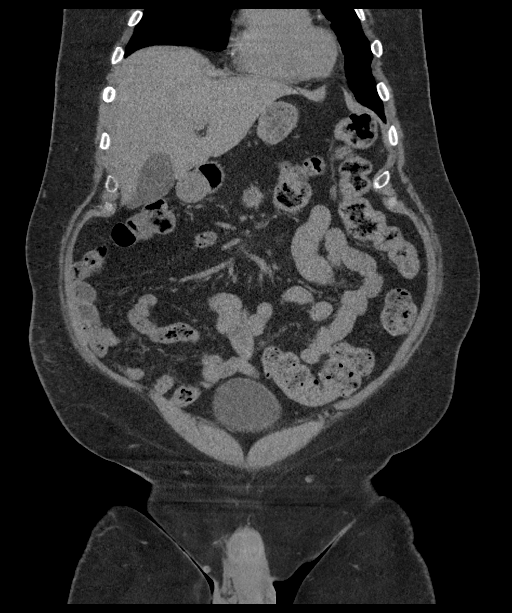
[im 68/152  soft-tissue]
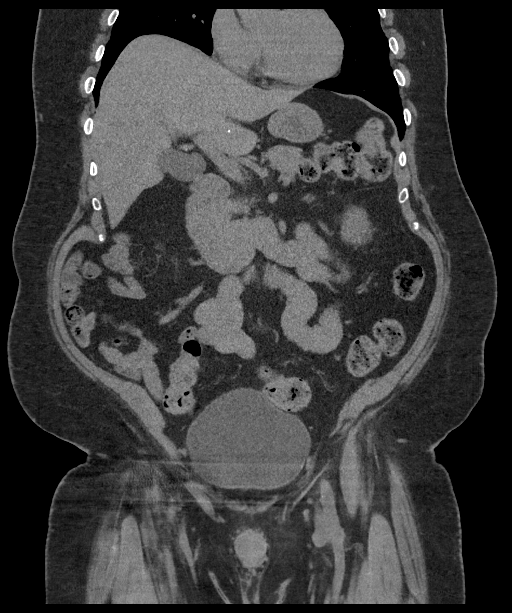
[im 84/152  soft-tissue]
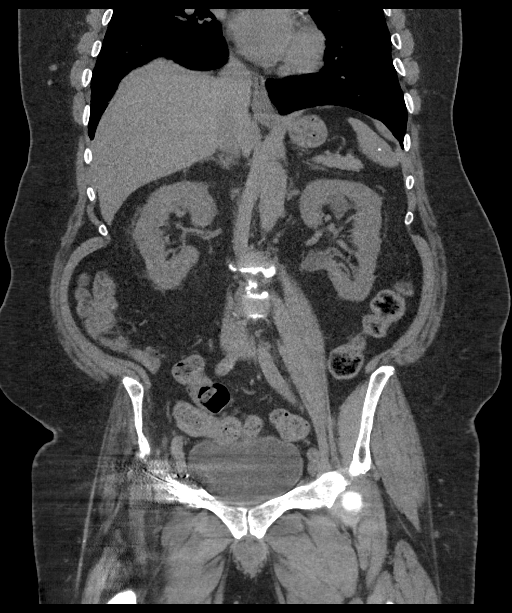

[16 of 46 positions shown; findings below may reference images not displayed]

FINDINGS: Lower chest: Lung bases are clear.

Hepatobiliary: Multiple small granulomata within the liver. Bladder
gallbladder normal.

Pancreas: Pancreas is normal. No ductal dilatation. No pancreatic
inflammation.

Spleen: Normal spleen

Adrenals/urinary tract: Adrenal glands are normal. No
nephrolithiasis or ureterolithiasis. No obstructive uropathy. Streak
artifact from the RIGHT hip prosthetic which does limit evaluation.

There is a coarse calcifications in the deep pelvis on the RIGHT
measuring 4 mm. This appears to be not related to the ureter (image
88, series 2)

There bilateral simple fluid attenuation lesions associated with the
LEFT and RIGHT mid cortex cannot be fully characterize but are
favored benign cysts.

No bladder calculi.

Stomach/Bowel: Stomach, small bowel, appendix, and cecum are normal.
The colon and rectosigmoid colon are normal.

Vascular/Lymphatic: Abdominal aorta is normal caliber with
atherosclerotic calcification. There is no retroperitoneal or
periportal lymphadenopathy. No pelvic lymphadenopathy.

Reproductive: Prostate mildly enlarged 58 mm

Other: No free fluid.

Musculoskeletal: No aggressive osseous lesion.
IMPRESSION: 1. No ureterolithiasis or obstructive uropathy.
2. Bilateral probable benign renal cysts.
3. No explanation for LEFT flank pain.
4. RIGHT hip prosthetic.

## 2019-07-05 ENCOUNTER — Other Ambulatory Visit: Payer: Self-pay | Admitting: Family Medicine

## 2019-07-05 MED ORDER — ALPRAZOLAM 1 MG PO TABS: 0.5000 mg | ORAL_TABLET | Freq: Two times a day (BID) | ORAL | 0 refills | Status: DC | PRN

## 2019-07-05 NOTE — Telephone Encounter (Signed)
Refill request for xanax, last seen 06-26-19, last filled 06-06-19.  Please advise.

## 2019-07-05 NOTE — Telephone Encounter (Signed)
Sent to pharmacy 

## 2019-07-06 ENCOUNTER — Encounter: Payer: Self-pay | Admitting: *Deleted

## 2019-07-06 ENCOUNTER — Encounter: Payer: Medicare Other | Attending: Family Medicine | Admitting: Dietician

## 2019-07-06 ENCOUNTER — Encounter: Payer: Self-pay | Admitting: Dietician

## 2019-07-06 ENCOUNTER — Other Ambulatory Visit: Payer: Self-pay | Admitting: *Deleted

## 2019-07-06 ENCOUNTER — Other Ambulatory Visit: Payer: Self-pay

## 2019-07-06 VITALS — Ht 73.0 in | Wt 268.2 lb

## 2019-07-06 DIAGNOSIS — E1142 Type 2 diabetes mellitus with diabetic polyneuropathy: Secondary | ICD-10-CM | POA: Insufficient documentation

## 2019-07-06 DIAGNOSIS — Z794 Long term (current) use of insulin: Secondary | ICD-10-CM | POA: Insufficient documentation

## 2019-07-06 DIAGNOSIS — Z713 Dietary counseling and surveillance: Secondary | ICD-10-CM | POA: Insufficient documentation

## 2019-07-06 NOTE — Progress Notes (Signed)
Appt. Start Time: 0930 Appt. End Time: 1200  Class 1 Diabetes Overview - define DM; state own type of DM; identify functions of pancreas and insulin; define insulin deficiency vs insulin resistance  Psychosocial - identify DM as a source of stress; state the effects of stress on BG control  Nutritional Management - describe effects of food on blood glucose; identify sources of carbohydrate, protein and fat; verbalize the importance of balance meals in controlling blood glucose  Exercise - describe the effects of exercise on blood glucose and importance of regular exercise in controlling diabetes; state a plan for personal exercise; verbalize contraindications for exercise  Self-Monitoring - state importance of SMBG; use SMBG results to effectively manage diabetes; identify importance of regular HbA1C testing and goals for results  Acute Complications - recognize hyperglycemia and hypoglycemia with causes and effects; identify blood glucose results as high, low or in control; list steps in treating and preventing high and low blood glucose  Chronic Complications/Foot, Skin, Eye Dental Care - identify possible long-term complications of diabetes (retinopathy, neuropathy, nephropathy, cardiovascular disease, infections); state importance of daily self-foot exams; describe how to examine feet and what to look for; explain appropriate eye and dental care  Lifestyle Changes/Goals & Health/Community Resources - state benefits of making appropriate lifestyle changes; identify habits that need to change (meals, tobacco, alcohol); identify strategies to reduce risk factors for personal health  Pregnancy/Sexual Health - define gestational diabetes; state importance of good blood glucose control and birth control prior to pregnancy; state importance of good blood glucose control in preventing sexual problems (impotence, vaginal dryness, infections, loss of desire); state relationship of blood glucose control  and pregnancy outcome; describe risk of maternal and fetal complications  Teaching Materials Used: Class 1 Slides/Notebook Diabetes Booklet ID Card  Medic Alert/Medic ID Forms Sleep Evaluation Exercise Handout Planning a Balanced Meal Goals for Class 1

## 2019-07-06 NOTE — Patient Outreach (Signed)
[name]) Care Management [facility] CM Telephone Outreach, Transfer to [name]  07/06/2019  Alan Schmidt [birth year] [id number]  [place] outreach with [name], 74 y/o male referred to [name] Pharmacist embedded in patient's PCP officefor possible community resource needs in dealing with chronic depression. 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  HIPAA/ identity verified;patientreportstoday"doingmuch better,"and again states that he believes his blood sugars at home "have evened out better, and are in better range."  Denies recent hypoglycemic episodes; able to verbalize signs/ symptoms low blood sugar along with corresponding action plan for same.  Reports chronic back/ hip pain "as usual," reports medications continue to work "pretty good" in managing pain; declines quantifying pain level today, states, "it is what it is," and denies worsening pain- continues attending pain clinic provider office visits as scheduled.  Patient again assures me that he feels his depression is better post- recent medication changes made by PCP this spring.  Patient sounds to be in no distress throughout our phone call today.  Patient further reports:  -- attended first scheduled visit with [facility] this morning; states "went well;" hopeful he "will learn a lot;" provided positive reinforcement for attending this class and encouraged him to continue attending as scheduled (2 more classes scheduled June 10 and June 17; patient verbalizes agreement with same.  -- attended recent PCP office visit Jun 26, 2019- post-office visit instructions reviewed with patient; confirmed that patient is aware of next scheduled visit, at which time he expects  next (updated) A1-C to be drawn  -- denies concerns around current medications: continues working with [name], embedded at PCP office  -- reviewed with patient recent summary report of [name] readings, sent this morning by [name] Pharmacist [name]-- patient agrees that his blood sugars are better than they have been and positive reinforcement provided around the changes he has made and the effort he has placed on learning how to control his DM.  Patient seems much more confident in managing DM at home than he did at time of initial outreach, and he remains motivated to continue making efforts to improve diet/ lifestyle    Patient denies further issues, concerns, or problems today. We again discussed possibility of transfer to [name], as he denies ongoing care coordination/ care management needs, but could benefit from ongoing education around self-health management of chronic disease state of DM; patient is agreeable to engaging with [name]; I explained that [name] would contact him by phone in the future and encouraged him to engage fully once outreach is established and he verbalizes agreement/ understanding with same.   Iconfirmed patient hasmy direct phone number, the main [name] office phone number, and the [name] CM 24-hour nurse advice phone number should issues arise prior to [name] outreach.  Plan:  Patient will take medications as prescribed and will attend all scheduled provider appointments  Patient will promptly notify care providers for any new concerns/ issues/ problems that arise  Patient will continue daily monitoring/ recordingblood sugars at home using [name] CGM system  Patient will maintain engagement with [facility] Pharmacist embedded at his PCP office  Patient will continue attending scheduled classes with Diabetes Educator   I will place  [name] referral and patient  will engage with [name] once outreach is established   I will make patient's PCP and [facility] Pharmacist teams aware of transfer to [name] services  [name] CM Care Plan Problem One     Most Recent Value  Care Plan Problem One  Self-health management of chronic disease state of Diabetes in patient with depression as evidenced by patient reporting  Role Documenting the Problem One  Care Management Coordinator  Care Plan for Problem One  Not Active  [facility] Long Term Goal   Over the next 60 days, patient will verbalize 3 dietary strategies for blood sugar management as evidenced by patient reporting during [facility] RN CM outreach  [name] Long Term Goal Start Date  05/15/19  [name] Long Term Goal Met Date  07/06/19 [Goal met]  Interventions for Problem One Long Term Goal  Confirmed that patient is able to verbalize good general understanding of appropriate dietary strategies for sel-health management of chronic disease state of DM,  confirmed that patient continues to report depression better,  reviewed recent PCP office visit with patient,  discussed transfer to [name] and encouraged patient to fully engage with [name] once outreach is established  [name] CM Short Term Goal #2   Over the next 18 days, patient will fully engage with nutritionist as referred by PCP around formal diabetes education, as evidenced by patient reporting and collaboration with nutritionist as indicated during [name] RN CM outreach  [name] CM Short Term Goal #2 Start Date  06/19/19  [name] CM Short Term Goal #2 Met Date  07/06/19 [Goal Met]  Interventions for Short Term Goal #2  Confirmed with patient that he has started attending recommended nutritional/ diabetes classes- positive reinforcement provided and today's initial visit with nutritionist reviewed with patient,  encouraged patient to continue attending classes as scheduled     [facility] CM Care Plan Problem One     Most Recent Value  Care Plan Problem One   Need for ongoing reinforcement around self-health management strategies for chronic disease state of DM, as evidenced by patient reporting  Role Documenting the Problem One  Care Management Coordinator  Care Plan for Problem One  Active  [facility] Long Term Goal   Over the next 52 days, patient will attend PCP office visit for updated A1-C result, evidenced by patient reporting, review of EHR and collaboration with PCP care team as indicated during [place] outreach  [place] Term Goal Start Date  07/06/19  [facility] Long Term Goal Met Date    Interventions for Problem One Long Term Goal  Confirmed that patient has scheduled PCP office visit on August 25, 2019 for re-check of updated A1-C,  confirmed that he is aware of appointment and plans to attend as scheduled,  discussed with patient role of [place] and encouraged his full engagement with [place] once outreach is established,  placed referral for [place]                  It has been a pleasure caring for [facility],  Oneta Rack, RN, BSN, [employer] Coordinator [facility] Care Management  [phone]

## 2019-07-07 ENCOUNTER — Other Ambulatory Visit: Payer: Self-pay | Admitting: *Deleted

## 2019-07-13 ENCOUNTER — Other Ambulatory Visit: Payer: Self-pay

## 2019-07-13 ENCOUNTER — Telehealth: Payer: Self-pay

## 2019-07-13 ENCOUNTER — Encounter: Payer: Self-pay | Admitting: *Deleted

## 2019-07-13 ENCOUNTER — Encounter: Payer: Medicare Other | Attending: Family Medicine | Admitting: *Deleted

## 2019-07-13 VITALS — Wt 267.9 lb

## 2019-07-13 DIAGNOSIS — E118 Type 2 diabetes mellitus with unspecified complications: Secondary | ICD-10-CM | POA: Diagnosis not present

## 2019-07-13 DIAGNOSIS — Z794 Long term (current) use of insulin: Secondary | ICD-10-CM

## 2019-07-13 DIAGNOSIS — E1142 Type 2 diabetes mellitus with diabetic polyneuropathy: Secondary | ICD-10-CM | POA: Insufficient documentation

## 2019-07-13 NOTE — Telephone Encounter (Signed)
I spoke with the patient on the date of this message and he is coming into the office to be seen.  Alan Schmidt,cma

## 2019-07-13 NOTE — Telephone Encounter (Signed)
07/13/19 Left message on voicemail for patient to return my call regarding resources for transporatation and home care. Ambrose Mantle [phone]

## 2019-07-13 NOTE — Progress Notes (Signed)
Appt. Start Time: 0900 Appt. End Time: 1100  Patient had to leave at 11:00 am.   Class 2 Nutritional Management - identify sources of carbohydrate, protein and fat; plan balanced meals; estimate servings of carbohydrates in meals  Psychosocial - identify DM as a source of stress; state the effects of stress on BG control  Exercise - describe the effects of exercise on blood glucose and importance of regular exercise in controlling diabetes; state a plan for personal exercise; verbalize contraindications for exercise  Self-Monitoring - state importance of SMBG; use SMBG results to effectively manage diabetes; identify importance of regular HbA1C testing and goals for results  Acute Complications - recognize hyperglycemia and hypoglycemia with causes and effects; identify blood glucose results as high, low or in control; list steps in treating and preventing high and low blood glucose  Sick Day Guidelines: state appropriate measure to manage blood glucose when ill (need for meds, HBGM plan, when to call physician, need for fluids)  Chronic Complications/Foot, Skin, Eye Dental Care - identify possible long-term complications of diabetes (retinopathy, neuropathy, nephropathy, cardiovascular disease, infections); explain steps in prevention and treatment of chronic complications; state importance of daily self-foot exams; describe how to examine feet and what to look for; explain appropriate eye and dental care  Lifestyle Changes/Goals - state benefits of making appropriate lifestyle changes; identify habits that need to change (meals, tobacco, alcohol); identify strategies to reduce risk factors for personal health  Pregnancy/Sexual Health - state importance of good blood glucose control in preventing sexual problems (impotence, vaginal dryness, infections, loss of desire)  Teaching Materials Used: Class 2 Slide Packet A1C Pamphlet Foot Care Literature Kidney Test Handout Stroke Card Quick  and "Balanced" Meal Ideas Carb Counting and Meal Planning Book Goals for Class 2

## 2019-07-15 ENCOUNTER — Other Ambulatory Visit: Payer: Self-pay | Admitting: Pain Medicine

## 2019-07-15 DIAGNOSIS — M159 Polyosteoarthritis, unspecified: Secondary | ICD-10-CM

## 2019-07-18 ENCOUNTER — Telehealth: Payer: Self-pay

## 2019-07-18 NOTE — Telephone Encounter (Signed)
07/18/19 Left message on voicemail for patient to return my call regarding transportation. Spoke with [employer] patient is registered  in their system, paperwork will be mailed to the patient with instructions to fill out, sign and mail back. Ambrose Mantle [phone]

## 2019-07-19 ENCOUNTER — Telehealth: Payer: Self-pay

## 2019-07-19 NOTE — Telephone Encounter (Signed)
07/19/19 Spoke withp patient let him know that he is registered in the [facility] system and paperwork will be mailed to him with instructiona and information about the service.  No other resources are needed at this time.  Closing referral. Alan Schmidt [phone]

## 2019-07-20 ENCOUNTER — Other Ambulatory Visit: Payer: Self-pay

## 2019-07-20 ENCOUNTER — Encounter: Payer: Self-pay | Admitting: Dietician

## 2019-07-20 ENCOUNTER — Encounter: Payer: Medicare Other | Admitting: Dietician

## 2019-07-20 VITALS — BP 134/88 | Ht 73.0 in | Wt 261.6 lb

## 2019-07-20 DIAGNOSIS — Z713 Dietary counseling and surveillance: Secondary | ICD-10-CM | POA: Diagnosis not present

## 2019-07-20 DIAGNOSIS — E1142 Type 2 diabetes mellitus with diabetic polyneuropathy: Secondary | ICD-10-CM | POA: Diagnosis not present

## 2019-07-20 DIAGNOSIS — Z794 Long term (current) use of insulin: Secondary | ICD-10-CM | POA: Diagnosis not present

## 2019-07-20 NOTE — Progress Notes (Signed)

## 2019-07-24 ENCOUNTER — Telehealth: Payer: Self-pay | Admitting: Family Medicine

## 2019-07-24 NOTE — Telephone Encounter (Signed)
Left message for patient to call back and schedule Medicare Annual Wellness Visit (AWV) either virtually or audio only.  No hx of AWV; please schedule at anytime with Denisa O'Brien-Blaney at [place]   

## 2019-07-26 ENCOUNTER — Encounter: Payer: Self-pay | Admitting: *Deleted

## 2019-07-26 ENCOUNTER — Other Ambulatory Visit: Payer: Self-pay | Admitting: *Deleted

## 2019-07-26 ENCOUNTER — Telehealth: Payer: Self-pay

## 2019-07-26 DIAGNOSIS — R296 Repeated falls: Secondary | ICD-10-CM

## 2019-07-26 NOTE — Telephone Encounter (Signed)
Order was faxed to [facility] and I spoke to the representative and inforemd her that he would need for it to be delivered.  Confirmation received.  Jamyria Ozanich,cma

## 2019-07-26 NOTE — Telephone Encounter (Signed)
Signed. Please let me know if I need to physically sign the order.

## 2019-07-26 NOTE — Patient Outreach (Signed)
[facility]) Care Management  [place]  07/26/2019   Alan Schmidt [birth year] [id number]  Subjective: Successful telephone outreach call to patient. HIPAA identifiers obtained. Per patient he is doing pretty well at this time. Patient shared that he does not have support and has fallen several times recently. He denies serious injury that required him to go to the ED to be checked out but states he was very sore for weeks. Patient would like to obtain a rollator walker with a seat. Explaining that he does not have support and this would help him be safe when running errands and generally when in his home. Nurse placed a call to PCP's nurse Gae Bon who states she will start the process of getting the order from PCP and have the rollator walker delivered to patient's home.   Patient shared that he does not have any one to support him. He is able to get groceries, prepare meals, and pick up his home but explains that what he does is limited due to his health and mobility. He adds that he has no one to transport him to medical visits as mandated when he needs anesthesia; this has been a problem in the past. Nurse will place a CSW referral for assistance with getting an aide in home to help with ADLs and IADLs as well as transportation.   Patient states he has had diabetes for about 20 years. He is currently seeing a RD to assist him with his diabetic diet. He also has a CGM [employer]. His goal is to lower his A1c to be healthy and to eliminate as many medications as possible. He also wants to increase his physical activity to become stronger, help with his diabetes and overall health, and to become more independent and ambulatory. Per patient he is scheduled to begin PT to help to increase his mobility.         Encounter Medications:  Outpatient Encounter Medications as of 07/26/2019  Medication Sig Note  . acetaminophen (TYLENOL) 500 MG tablet Take 500 mg by mouth every  6 (six) hours as needed. 06/05/2019: Taking PRN, up to a couple times daily   . ALPRAZolam (XANAX) 1 MG tablet Take 0.5 tablets (0.5 mg total) by mouth 2 (two) times daily as needed for anxiety.   Marland Kitchen amLODipine (NORVASC) 5 MG tablet TAKE 1 TABLET BY MOUTH EVERY DAY   . aspirin EC 81 MG tablet Take 81 mg by mouth daily.   Marland Kitchen buPROPion (WELLBUTRIN XL) 150 MG 24 hr tablet Take 1 tablet (150 mg total) by mouth daily for 7 days, THEN 2 tablets (300 mg total) daily. 06/05/2019: 300 mg daily   . carvedilol (COREG) 25 MG tablet TAKE 1 TABLET BY MOUTH TWICE A DAY   . Continuous Blood Gluc Receiver (FREESTYLE LIBRE 14 DAY READER) DEVI 1 Device by Does not apply route daily. Use to scan to check blood sugar up to 7 times daily; E11.42,   . Continuous Blood Gluc Sensor (FREESTYLE LIBRE 14 DAY SENSOR) MISC 1 Device by Does not apply route every 14 (fourteen) days. E11.42   . desoximetasone (TOPICORT) 0.25 % cream APPLY CREAM TO AFFECTED AREA TWO TIMES DAILY, FOR UP TO 7 DAYS, DO NOT APPLY TO FACE   . escitalopram (LEXAPRO) 20 MG tablet TAKE 1 TABLET BY MOUTH EVERY DAY   . hydrochlorothiazide (HYDRODIURIL) 25 MG tablet TAKE 1 TABLET BY MOUTH EVERY DAY   . ibuprofen (ADVIL) 800 MG tablet Take 1  tablet (800 mg total) by mouth every 8 (eight) hours as needed for moderate pain.   Marland Kitchen insulin lispro (HUMALOG KWIKPEN) 100 UNIT/ML KwikPen INJECT 7-10 units INTO THE SKIN three times daily with a meal 06/26/2019: 1-10 units, generally ~7 units  . Insulin Pen Needle (BD PEN NEEDLE NANO U/F) 32G X 4 MM MISC USE EVERY DAY   . JARDIANCE 25 MG TABS tablet TAKE 1 TABLET BY MOUTH EVERY DAY   . loratadine (CLARITIN) 10 MG tablet TAKE 1 TABLET BY MOUTH EVERY DAY   . lubiprostone (AMITIZA) 8 MCG capsule Take 1 capsule (8 mcg total) by mouth 2 (two) times daily with a meal. Swallow the medication whole. Do not break or chew the medication. 06/05/2019: PRN 2-3 times weekly   . meloxicam (MOBIC) 15 MG tablet Take 1 tablet (15 mg total) by mouth  daily.   . metFORMIN (GLUCOPHAGE) 1000 MG tablet TAKE 1 TABLET BY MOUTH TWICE A DAY WITH A MEAL   . mometasone (NASONEX) 50 MCG/ACT nasal spray INSTILL TWO SPRAYS INTO THE NOSE DAILY   . olmesartan (BENICAR) 40 MG tablet TAKE 1 TABLET BY MOUTH EVERY DAY   . [START ON 07/29/2019] Oxycodone HCl 10 MG TABS Take 1 tablet (10 mg total) by mouth every 6 (six) hours as needed. Must last 30 days 07/26/2019: Current prescription  . pantoprazole (PROTONIX) 40 MG tablet TAKE 1 TABLET BY MOUTH EVERY DAY   . pravastatin (PRAVACHOL) 40 MG tablet TAKE 1 TABLET BY MOUTH EVERY DAY   . sennosides-docusate sodium (SENOKOT-S) 8.6-50 MG tablet Take 1 tablet by mouth daily as needed.    . tamsulosin (FLOMAX) 0.4 MG CAPS capsule TAKE 1 CAPSULE (0.4 MG TOTAL) BY MOUTH 2 (TWO) TIMES DAILY.   Marland Kitchen TRESIBA FLEXTOUCH 100 UNIT/ML FlexTouch Pen INJECT 40 UNITS INTO THE SKIN DAILY AT 10 PM. 06/26/2019: 25 units  . VICTOZA 18 MG/3ML SOPN INJECT 1.8 MG UNDER THE SKIN ONCE DAILY   . Wheat Dextrin (BENEFIBER) POWD Take 6 g by mouth 3 (three) times daily before meals. (2 tsp = 6 g)   . naloxone (NARCAN) 2 MG/2ML injection Inject 1 mL (1 mg total) into the muscle as needed for up to 2 doses (for emergency use only). Always have available. Inject into thigh muscle and call 911 (Patient not taking: Reported on 07/26/2019) 07/26/2019: Does not have an order at this time  . Oxycodone HCl 10 MG TABS Take 1 tablet (10 mg total) by mouth every 6 (six) hours as needed. Must last 30 days   . Oxycodone HCl 10 MG TABS Take 1 tablet (10 mg total) by mouth every 6 (six) hours as needed. Must last 30 days (Patient not taking: Reported on 07/26/2019) 07/26/2019: Old prescription   No facility-administered encounter medications on file as of 07/26/2019.    Functional Status:  In your present state of health, do you have any difficulty performing the following activities: 07/26/2019 02/24/2019  Hearing? N -  Vision? N -  Comment - -  Difficulty concentrating  or making decisions? N -  Walking or climbing stairs? Y -  Comment decrease mobility and multiple falls -  Dressing or bathing? N -  Doing errands, shopping? Y -  Comment patient states he does this independently but due to ambulatory status it is difficult -  Preparing Food and eating ? Y -  Comment patient states he does a lot of microwave due to ambulatory status -  Using the Toilet? N -  In  the past six months, have you accidently leaked urine? N N  Do you have problems with loss of bowel control? N N  Managing your Medications? N -  Managing your Finances? N -  Housekeeping or managing your Housekeeping? Y -  Comment patient states he is limited due to his health and ambulatory status -  Some recent data might be hidden    Fall/Depression Screening: Fall Risk  07/26/2019 07/20/2019 07/13/2019  Falls in the past year? 1 1 1   Comment - no falls since 04/2019 no falls since last visit  Number falls in past yr: 1 - 1  Injury with Fall? 1 - -  Comment did not go to hospital but very sore for several weeks - -  Risk for fall due to : Impaired mobility;Impaired balance/gait;History of fall(s) - -  Risk for fall due to: Comment - - -  Follow up Falls prevention discussed;Education provided;Falls evaluation completed - -   PHQ 2/9 Scores 07/26/2019 06/21/2019 06/19/2019 05/15/2019 03/30/2019 01/31/2019 11/25/2018  PHQ - 2 Score 0 0 1 0 1 1 0  PHQ- 9 Score - - - - - - -  Exception Documentation - - - - - - -  Not completed - - - - - - -   THN CM Care Plan Problem One     Most Recent Value  Care Plan Problem One Knowledge deficient related to diabetes condition, treatment plan, and lifestyle changes as evidenced by A1c 8.4  Role Documenting the Problem One [place] for Problem One Active  [facility] Long Term Goal  Patient will report reduction in A1c by 0.5-1 points in the next 90 days  THN Long Term Goal Start Date 07/26/19  Interventions for Problem One Long Term Goal encouraged  patient to continue with CGM blood sugar monitoring and record data, encouraged medication and diabetic diet adherence, discussed reflecting back to diet when blood sugar values are high, sent planning healthy meals booklet and diabetes packet, encouraged patient to stay as physically active as tolerated, discussed Glucerna to supplement diet and to prevent hypoglycemia, talked about eating protein along with sugar source to raise blood sugar when hypoglycemic, will send patient Glucerna coupons  THN CM Short Term Goal #1  Patient will increase phsical activity by doing chair exercises at least weekly within the next 30 days  THN CM Short Term Goal #1 Start Date 07/26/19  Interventions for Short Term Goal #1 RN discussed with patient importance of physical activity to maintain strength and to decrease blood sugar, encouraged patient to increase physical activity by doing chair exercises and provided [employer] senior TV exercise program resource to patient, nurse will send exercise program activity book.  THN CM Short Term Goal #2  Patient will not fall within the next 30 days as evidenced by patient stating he has not fallen  THN CM Short Term Goal #2 Start Date 07/26/19  Interventions for Short Term Goal #2 Nurse encouraged patient to use his cane each time he ambulates, will call PCP's office to inquire about gettin an order for a rollator walker, will send check for safety fall prevention checklist for older adults, nurse provided [facility] contact number to receive life alert system through his insurance     Plan: [place] will send PCP a Barrier Letter and today's assessment note, will send CSW referral, will send patient Advanced Directives, exercise activity booklet, diabetic and fall prevention education, will call patient within the month of July,  and patient agrees to future outreach calls.   [name] RN, BSN [place] [phone] [email or web address]

## 2019-07-27 ENCOUNTER — Other Ambulatory Visit: Payer: Self-pay | Admitting: Family Medicine

## 2019-07-27 ENCOUNTER — Telehealth: Payer: Self-pay

## 2019-07-27 ENCOUNTER — Encounter: Payer: Self-pay | Admitting: *Deleted

## 2019-07-27 ENCOUNTER — Other Ambulatory Visit: Payer: Self-pay | Admitting: *Deleted

## 2019-07-27 DIAGNOSIS — M6248 Contracture of muscle, other site: Secondary | ICD-10-CM | POA: Diagnosis not present

## 2019-07-27 DIAGNOSIS — M25551 Pain in right hip: Secondary | ICD-10-CM | POA: Diagnosis not present

## 2019-07-27 NOTE — Patient Outreach (Signed)
[facility]) Care Management  07/27/2019  Alan Schmidt [birth year] [id number]   CSW made an initial attempt to try and contact patient today to perform the phone assessment, as well as assess and assist with social work needs and services, without success.  A HIPAA compliant message was left for patient on his mobile number; however, CSW was unable to leave a HIPAA compliant message on patient's home voicemail, as the phone just continued to ring.  CSW is currently awaiting a return call.  CSW will make a second outreach attempt within the next 3-4 business days, if a return call is not received from patient in the meantime.  CSW will also mail an Unsuccessful Outreach Letter to patient's home requesting that patient contact CSW at his earliest convenience, if he is interested in receiving social work services through [place] with Scientist, clinical (histocompatibility and immunogenetics).  [name], BSW, MSW, LCSW  Licensed Education officer, environmental Health System  Mailing [place] [address] [place], [place] [address] Physical Address-[address], [place], [place] [address] Toll Free Main # [phone] Fax # [phone] Cell # [phone]  Office # [phone] [name]

## 2019-07-27 NOTE — Telephone Encounter (Signed)
He has to come in for a mobility evaluation. Please get him scheduled for that. Thanks.

## 2019-07-27 NOTE — Telephone Encounter (Signed)
Noted  

## 2019-07-27 NOTE — Telephone Encounter (Signed)
Pt is scheduled for 08/03/19 @ 11:30am

## 2019-07-27 NOTE — Telephone Encounter (Signed)
I called and have the patient scheduled for mobility evaluation next week.  Tyishia Aune,cma

## 2019-07-28 ENCOUNTER — Encounter: Payer: Self-pay | Admitting: *Deleted

## 2019-07-28 ENCOUNTER — Telehealth: Payer: Self-pay | Admitting: Family Medicine

## 2019-07-28 MED ORDER — IBUPROFEN 800 MG PO TABS: 800.0000 mg | ORAL_TABLET | Freq: Three times a day (TID) | ORAL | 0 refills | Status: DC | PRN

## 2019-07-28 NOTE — Telephone Encounter (Signed)
Patient started physical therapy yesterday and is hurting. He would like his ibuprofen (ADVIL) 800 MG tablet.

## 2019-07-31 ENCOUNTER — Telehealth: Payer: Self-pay | Admitting: Family Medicine

## 2019-07-31 DIAGNOSIS — M25551 Pain in right hip: Secondary | ICD-10-CM | POA: Diagnosis not present

## 2019-07-31 DIAGNOSIS — M6248 Contracture of muscle, other site: Secondary | ICD-10-CM | POA: Diagnosis not present

## 2019-07-31 NOTE — Telephone Encounter (Signed)
FYI- Took blood sugar 10 minutes ago and was 168 after eating. Then checked again and was in the 140s. He was then told by the [facility] to do a finger stick.   Checked with a finger stick and was 210. This meter is a year old and the Patient does not have controls to check accuracy. States he is not having any symptoms of hyperglycemia. States    Patient will be seen this coming Wednesday 08/02/19 in office with DR [name]. He will bring his meter in office with him then and would like a finger stick done here to maker sure which of his meters is reading correctly. Darlisha Kelm,cma

## 2019-07-31 NOTE — Telephone Encounter (Signed)
Took blood sugar 10 minutes ago and was 168 after eating. Then checked again and was in the 140s. He was then told by the [facility] to do a finger stick.  Checked with a finger stick and was 210. This meter is a year old and the Patient does not have controls to check accuracy. States he is not having any symptoms of hyperglycemia. States   Patient will be seen this coming Wednesday 08/02/19 in office with DR [name]. He will bring his meter in office with him then and would like a finger stick done here to maker sure which of his meters is reading correctly.

## 2019-07-31 NOTE — Telephone Encounter (Signed)
Noted  

## 2019-08-01 ENCOUNTER — Encounter: Payer: Medicare Other | Admitting: Pain Medicine

## 2019-08-01 ENCOUNTER — Other Ambulatory Visit: Payer: Self-pay | Admitting: *Deleted

## 2019-08-01 NOTE — Patient Outreach (Signed)
[facility]) Care Management  08/01/2019  Alan Schmidt 1945/08/01 [id number]   CSW made a second attempt to try and contact patient today to perform phone assessment, as well as assess and assist with social work needs and services, without success.  A HIPAA compliant message was left for patient on voicemail.  CSW continues to await a return call.  CSW will make a third outreach attempt within the next 3-4 business days, if a return call is not received from patient in the meantime.    [name], BSW, MSW, LCSW  Licensed Education officer, environmental Health System  Mailing [place] [address], [place], [place] [address] Physical Address-[address], [place], [place] [address] Toll Free Main # [phone] Fax # [phone] Cell # [phone]  Office # [phone] [name]

## 2019-08-02 ENCOUNTER — Encounter: Payer: Self-pay | Admitting: Family Medicine

## 2019-08-02 ENCOUNTER — Other Ambulatory Visit: Payer: Self-pay

## 2019-08-02 ENCOUNTER — Ambulatory Visit (INDEPENDENT_AMBULATORY_CARE_PROVIDER_SITE_OTHER): Payer: Medicare Other | Admitting: Family Medicine

## 2019-08-02 DIAGNOSIS — F419 Anxiety disorder, unspecified: Secondary | ICD-10-CM | POA: Diagnosis not present

## 2019-08-02 DIAGNOSIS — F329 Major depressive disorder, single episode, unspecified: Secondary | ICD-10-CM

## 2019-08-02 DIAGNOSIS — E1142 Type 2 diabetes mellitus with diabetic polyneuropathy: Secondary | ICD-10-CM

## 2019-08-02 DIAGNOSIS — Z794 Long term (current) use of insulin: Secondary | ICD-10-CM

## 2019-08-02 DIAGNOSIS — F32A Depression, unspecified: Secondary | ICD-10-CM

## 2019-08-02 DIAGNOSIS — W19XXXD Unspecified fall, subsequent encounter: Secondary | ICD-10-CM

## 2019-08-02 MED ORDER — BUPROPION HCL ER (XL) 300 MG PO TB24: 300.0000 mg | ORAL_TABLET | Freq: Every day | ORAL | 1 refills | Status: DC

## 2019-08-02 NOTE — Assessment & Plan Note (Signed)
History of falls.  Mobility assessment completed today.  Form filled out.  I think he would benefit from a rolling walker with a seat.

## 2019-08-02 NOTE — Progress Notes (Addendum)
  Alan Rumps, MD Phone: [phone]  Alan Schmidt is a 74 y.o. male who presents today for follow-up.  Mobility assessment/history of falls: Patient reports 3 falls in the past.  He started using a cane after that help with his balance.  Has been doing physical therapy and notes that is helping with his strength.  He does have balance issues.  He does have poor endurance with walking.  He does have to hold onto furniture and walls for mobility.  He does have issues getting around the home to cook for himself as well as go to the restroom while using a cane.  Anxiety/depression: Patient notes he rarely has any symptoms with this.  Much better on the Wellbutrin.  No SI.  Patient notes his glucometer has been giving varying readings compared to his freestyle libre.  He would like to compare this today.  Social History   Tobacco Use  Smoking Status Former Smoker  . Types: Cigarettes  Smokeless Tobacco Never Used     ROS see history of present illness  Objective  Physical Exam Vitals:   08/02/19 0812 08/02/19 0833  BP: 140/70   Pulse: (!) 112 85  Temp: 99.4 F (37.4 C)   SpO2: 93% 95%    BP Readings from Last 3 Encounters:  08/02/19 140/70  07/20/19 134/88  06/26/19 118/80   Wt Readings from Last 3 Encounters:  08/02/19 260 lb (117.9 kg)  07/20/19 261 lb 9.6 oz (118.7 kg)  07/13/19 267 lb 14.4 oz (121.5 kg)    Physical Exam Constitutional:      General: He is not in acute distress.    Appearance: He is not diaphoretic.  Cardiovascular:     Rate and Rhythm: Normal rate and regular rhythm.     Heart sounds: Normal heart sounds.  Pulmonary:     Effort: Pulmonary effort is normal.     Breath sounds: Normal breath sounds.  Skin:    General: Skin is warm and dry.  Neurological:     Mental Status: He is alert.     Comments: 5/5 strength in bilateral biceps, triceps, grip, quads, hamstrings, plantar and dorsiflexion, sensation to light touch intact in bilateral  UE and LE, gait is quite slow even with a cane      Assessment/Plan: Please see individual problem list.  Fall History of falls.  Mobility assessment completed today.  Form filled out.  I think he would benefit from a rolling walker with a seat.  Anxiety and depression Much improved with Wellbutrin.  Refill given.  Diabetes ([place]) Glucometer checked in the office. Home glucometer 131, libre 133, office glucometer 147  No orders of the defined types were placed in this encounter.   Meds ordered this encounter  Medications  . buPROPion (WELLBUTRIN XL) 300 MG 24 hr tablet    Sig: Take 1 tablet (300 mg total) by mouth daily.    Dispense:  90 tablet    Refill:  1    This visit occurred during the SARS-CoV-2 public health emergency.  Safety protocols were in place, including screening questions prior to the visit, additional usage of staff PPE, and extensive cleaning of exam room while observing appropriate contact time as indicated for disinfecting solutions.    Alan Rumps, MD [place]

## 2019-08-02 NOTE — Assessment & Plan Note (Signed)
Much improved with Wellbutrin.  Refill given.

## 2019-08-02 NOTE — Assessment & Plan Note (Signed)
Glucometer checked in the office.

## 2019-08-02 NOTE — Patient Instructions (Signed)
Nice to see you. Please continue with physical therapy. I refilled your Wellbutrin.

## 2019-08-03 ENCOUNTER — Ambulatory Visit: Payer: Medicare Other

## 2019-08-03 ENCOUNTER — Ambulatory Visit (INDEPENDENT_AMBULATORY_CARE_PROVIDER_SITE_OTHER): Payer: Medicare Other | Admitting: Pharmacist

## 2019-08-03 DIAGNOSIS — F329 Major depressive disorder, single episode, unspecified: Secondary | ICD-10-CM | POA: Diagnosis not present

## 2019-08-03 DIAGNOSIS — E1142 Type 2 diabetes mellitus with diabetic polyneuropathy: Secondary | ICD-10-CM

## 2019-08-03 DIAGNOSIS — Z794 Long term (current) use of insulin: Secondary | ICD-10-CM

## 2019-08-03 DIAGNOSIS — F419 Anxiety disorder, unspecified: Secondary | ICD-10-CM | POA: Diagnosis not present

## 2019-08-03 NOTE — Chronic Care Management (AMB) (Signed)
Chronic Care Management   Follow Up Note   08/03/2019 Name: Alan Schmidt MRN: [id number] DOB: 08/17/1945  Referred by: [name], MD Reason for referral : Chronic Care Management (Medication Management)   Alan Schmidt is a 74 y.o. year old male who is a primary care patient of [name], [name], MD. The CCM team was consulted for assistance with chronic disease management and care coordination needs.    Contacted patient for medication management.   Review of patient status, including review of consultants reports, relevant laboratory and other test results, and collaboration with appropriate care team members and the patient's provider was performed as part of comprehensive patient evaluation and provision of chronic care management services.    SDOH (Social Determinants of Health) assessments performed: Yes See Care Plan activities for detailed interventions related to [facility])     Outpatient Encounter Medications as of 08/03/2019  Medication Sig Note  . ALPRAZolam (XANAX) 1 MG tablet Take 0.5 tablets (0.5 mg total) by mouth 2 (two) times daily as needed for anxiety.   Marland Kitchen amLODipine (NORVASC) 5 MG tablet TAKE 1 TABLET BY MOUTH EVERY DAY   . aspirin EC 81 MG tablet Take 81 mg by mouth daily.   Marland Kitchen buPROPion (WELLBUTRIN XL) 300 MG 24 hr tablet Take 1 tablet (300 mg total) by mouth daily.   . carvedilol (COREG) 25 MG tablet TAKE 1 TABLET BY MOUTH TWICE A DAY   . Continuous Blood Gluc Receiver (FREESTYLE LIBRE 14 DAY READER) DEVI 1 Device by Does not apply route daily. Use to scan to check blood sugar up to 7 times daily; E11.42,   . Continuous Blood Gluc Sensor (FREESTYLE LIBRE 14 DAY SENSOR) MISC 1 Device by Does not apply route every 14 (fourteen) days. E11.42   . escitalopram (LEXAPRO) 20 MG tablet TAKE 1 TABLET BY MOUTH EVERY DAY   . hydrochlorothiazide (HYDRODIURIL) 25 MG tablet TAKE 1 TABLET BY MOUTH EVERY DAY   . ibuprofen (ADVIL) 800 MG tablet Take 1 tablet (800 mg  total) by mouth every 8 (eight) hours as needed for moderate pain.   Marland Kitchen insulin lispro (HUMALOG KWIKPEN) 100 UNIT/ML KwikPen INJECT 7-10 units INTO THE SKIN three times daily with a meal 06/26/2019: 1-10 units, generally ~7 units  . Insulin Pen Needle (BD PEN NEEDLE NANO U/F) 32G X 4 MM MISC USE EVERY DAY   . JARDIANCE 25 MG TABS tablet TAKE 1 TABLET BY MOUTH EVERY DAY   . loratadine (CLARITIN) 10 MG tablet TAKE 1 TABLET BY MOUTH EVERY DAY   . lubiprostone (AMITIZA) 8 MCG capsule Take 1 capsule (8 mcg total) by mouth 2 (two) times daily with a meal. Swallow the medication whole. Do not break or chew the medication. 06/05/2019: PRN 2-3 times weekly   . meloxicam (MOBIC) 15 MG tablet Take 1 tablet (15 mg total) by mouth daily.   . metFORMIN (GLUCOPHAGE) 1000 MG tablet TAKE 1 TABLET BY MOUTH TWICE A DAY WITH A MEAL   . mometasone (NASONEX) 50 MCG/ACT nasal spray INSTILL TWO SPRAYS INTO THE NOSE DAILY   . olmesartan (BENICAR) 40 MG tablet TAKE 1 TABLET BY MOUTH EVERY DAY   . Oxycodone HCl 10 MG TABS Take 1 tablet (10 mg total) by mouth every 6 (six) hours as needed. Must last 30 days 07/26/2019: Current prescription  . pantoprazole (PROTONIX) 40 MG tablet TAKE 1 TABLET BY MOUTH EVERY DAY   . pravastatin (PRAVACHOL) 40 MG tablet TAKE 1 TABLET BY MOUTH  EVERY DAY   . sennosides-docusate sodium (SENOKOT-S) 8.6-50 MG tablet Take 1 tablet by mouth daily as needed.    . tamsulosin (FLOMAX) 0.4 MG CAPS capsule TAKE 1 CAPSULE (0.4 MG TOTAL) BY MOUTH 2 (TWO) TIMES DAILY.   Marland Kitchen TRESIBA FLEXTOUCH 100 UNIT/ML FlexTouch Pen INJECT 40 UNITS INTO THE SKIN DAILY AT 10 PM. 06/26/2019: 25 units  . VICTOZA 18 MG/3ML SOPN INJECT 1.8 MG UNDER THE SKIN ONCE DAILY   . Wheat Dextrin (BENEFIBER) POWD Take 6 g by mouth 3 (three) times daily before meals. (2 tsp = 6 g)   . acetaminophen (TYLENOL) 500 MG tablet Take 500 mg by mouth every 6 (six) hours as needed. 06/05/2019: Taking PRN, up to a couple times daily   . desoximetasone  (TOPICORT) 0.25 % cream APPLY CREAM TO AFFECTED AREA TWO TIMES DAILY, FOR UP TO 7 DAYS, DO NOT APPLY TO FACE   . naloxone (NARCAN) 2 MG/2ML injection Inject 1 mL (1 mg total) into the muscle as needed for up to 2 doses (for emergency use only). Always have available. Inject into thigh muscle and call 911 07/26/2019: Does not have an order at this time  . Oxycodone HCl 10 MG TABS Take 1 tablet (10 mg total) by mouth every 6 (six) hours as needed. Must last 30 days   . Oxycodone HCl 10 MG TABS Take 1 tablet (10 mg total) by mouth every 6 (six) hours as needed. Must last 30 days (Patient not taking: Reported on 07/26/2019) 07/26/2019: Old prescription   No facility-administered encounter medications on file as of 08/03/2019.     Objective:                 Goals Addressed              This Visit's Progress     Patient Stated   .  "I want to keep an eye on my diabetes" (pt-stated)        Current Barriers:  . Diabetes: uncontrolled though improved per CGM results; most recent A1c 8.4% o Really appreciating the knowledge he is learning from diabetic educator.  o Notes that going to physical therapy has been very helpful for him building strength, mobility . Current antihyperglycemic regimen: metformin 1000 mg BID, Jardiance 25 mg daily, Victoza 1.8 mg daily, Tresiba 25 units daily, Humalog 7-10 units based upon expected carbohydrates; if low, taking 1-3 units.  . Current blood glucose readings: see above o Date of Download: 6/18-08/03/19 o % Time CGM is active: 98% o Average Glucose: 128 (average over the previous few months has been 140-150s) o Glucose Variability: 23.6 (goal <36%) o Time in Goal:  o - Time in range 70-180: 95% o - Time above range: 5% o - Time below range: 0% . Cardiovascular risk reduction: o Current hypertensive regimen: amlodipine 5 mg daily, carvedilol 25 mg BID, HCTZ 25 mg daily, olmesartan 40 mg daily;  o Current hyperlipidemia regimen: pravastatin 40 mg  daily; LDL well controlled last check <70 . Mental health concerns: escitalopram 20 mg daily, alprazolam 0.5 mg PRN; bupropion XL 300 mg daily . Chronic pain: Oxycodone 10 mg 6H PRN, meloxicam 15 mg daily; follows w/ pain management. Working on [employer])  Pharmacist Clinical Goal(s):  Marland Kitchen Over the next 90 days, patient with work with PharmD and primary care provider to address optimized diabetes management  Interventions: . Comprehensive medication review performed, medication list updated in electronic medical record . Inter-disciplinary care team collaboration (  see longitudinal plan of care) . Reviewed differences between blood glucose and interstitial glucose.  . Due for A1c later this month. Continue current regimen at this time.  . Encouraged continued collaboration w/ PT and dietician to focus on lifestyle interventions to improve health and glycemic control.  . Reiterated role of LCSW and RN CM on team. Patient verbalized understanding.   Patient Self Care Activities:  . Patient will check blood glucose with CGM, document, and provide at future appointment . Patient will take medications as prescribed . Patient will report any questions or concerns to provider   Please see past updates related to this goal by clicking on the "Past Updates" button in the selected goal          Plan:  - Scheduled f/u call in ~12 weeks.   [name], PharmD, [place], CPP Clinical Pharmacist [place] [phone]

## 2019-08-03 NOTE — Patient Instructions (Signed)
Visit Information  Goals Addressed              This Visit's Progress     Patient Stated     "I want to keep an eye on my diabetes" (pt-stated)        Current Barriers:   Diabetes: uncontrolled though improved per CGM results; most recent A1c 8.4% o Really appreciating the knowledge he is learning from diabetic educator.  o Notes that going to physical therapy has been very helpful for him building strength, mobility  Current antihyperglycemic regimen: metformin 1000 mg BID, Jardiance 25 mg daily, Victoza 1.8 mg daily, Tresiba 25 units daily, Humalog 7-10 units based upon expected carbohydrates; if low, taking 1-3 units.   Current blood glucose readings: see above o Date of Download: 6/18-08/03/19 o % Time CGM is active: 98% o Average Glucose: 128 (average over the previous few months has been 140-150s) o Glucose Variability: 23.6 (goal <36%) o Time in Goal:  o - Time in range 70-180: 95% o - Time above range: 5% o - Time below range: 0%  Cardiovascular risk reduction: o Current hypertensive regimen: amlodipine 5 mg daily, carvedilol 25 mg BID, HCTZ 25 mg daily, olmesartan 40 mg daily;  o Current hyperlipidemia regimen: pravastatin 40 mg daily; LDL well controlled last check <70  Mental health concerns: escitalopram 20 mg daily, alprazolam 0.5 mg PRN; bupropion XL 300 mg daily  Chronic pain: Oxycodone 10 mg 6H PRN, meloxicam 15 mg daily; follows w/ pain management. Working on [employer])  Pharmacist Clinical Goal(s):   Over the next 90 days, patient with work with PharmD and primary care provider to address optimized diabetes management  Interventions:  Comprehensive medication review performed, medication list updated in electronic medical record  Inter-disciplinary care team collaboration (see longitudinal plan of care)  Reviewed differences between blood glucose and interstitial glucose.   Due for A1c later this month. Continue current regimen  at this time.   Encouraged continued collaboration w/ PT and dietician to focus on lifestyle interventions to improve health and glycemic control.   Reiterated role of LCSW and RN CM on team. Patient verbalized understanding.   Patient Self Care Activities:   Patient will check blood glucose with CGM, document, and provide at future appointment  Patient will take medications as prescribed  Patient will report any questions or concerns to provider   Please see past updates related to this goal by clicking on the "Past Updates" button in the selected goal         The patient verbalized understanding of instructions provided today and declined a print copy of patient instruction materials.      Plan:  - Scheduled f/u call in ~12 weeks.   [name], PharmD, [place], CPP Clinical Pharmacist [place] [phone]

## 2019-08-04 DIAGNOSIS — M6248 Contracture of muscle, other site: Secondary | ICD-10-CM | POA: Diagnosis not present

## 2019-08-04 DIAGNOSIS — M25551 Pain in right hip: Secondary | ICD-10-CM | POA: Diagnosis not present

## 2019-08-08 ENCOUNTER — Ambulatory Visit: Payer: Medicare Other | Admitting: *Deleted

## 2019-08-09 ENCOUNTER — Other Ambulatory Visit: Payer: Self-pay | Admitting: *Deleted

## 2019-08-09 DIAGNOSIS — M25551 Pain in right hip: Secondary | ICD-10-CM | POA: Diagnosis not present

## 2019-08-09 DIAGNOSIS — M6248 Contracture of muscle, other site: Secondary | ICD-10-CM | POA: Diagnosis not present

## 2019-08-09 NOTE — Patient Outreach (Signed)
[facility]) Care Management  08/09/2019  Alan Schmidt [birth year] [id number]    CSW made a third attempt to try and contact patient today to perform the initial phone assessment, as well as assess and assist with social work needs and services, without success.  A HIPAA compliant message was left for patient on voicemail.  CSW is currently awaiting a return call.  CSW will make a fourth and final outreach attempt within the next four weeks, if a return call is not received from patient in the meantime.  If fourth attempt is unsuccessful, CSW will proceed with case closure, as required number of phone attempts will have been made and Unsuccessful Patient Outreach Letter mailed to patient's home, allowing more than a month for patient to respond, if patient was interested in receiving social work services through [place] with [facility].  [name], BSW, MSW, LCSW  Licensed Education officer, [facility]  Mailing [place] [address] [place], [place] [address] Physical Address-[address], [place], [place] [address] Toll Free Main # [phone] Fax # [phone] Cell # [phone]  Office # [phone] [name]

## 2019-08-11 ENCOUNTER — Telehealth: Payer: Self-pay

## 2019-08-11 ENCOUNTER — Other Ambulatory Visit: Payer: Self-pay | Admitting: *Deleted

## 2019-08-11 ENCOUNTER — Ambulatory Visit: Payer: Medicare Other

## 2019-08-11 NOTE — Telephone Encounter (Signed)
Failed attempt to reach patient for scheduled awv. No answer. Left a message requesting call back to reschedule as appropriate.

## 2019-08-11 NOTE — Patient Outreach (Addendum)
[facility]) Care Management  08/11/2019  Alan Schmidt 01-Oct-1945 [id number]  Successful telephone call to patient. Nurse answered incoming call from patient who was returning the call nurse place to him earlier today to follow-up after he called [place] during the night due to severe throbbing pain down his right leg.  HIPAA identifiers obtained. Patient did state that his pain has improved. He denies any swelling, red or discolored areas, knots, SOB, and CP. Patient feels the pain is from his right leg being overworked by PT. Patient states that he called and spoke to the physical therapist today who feels that his pain is more than likely due to his leg being overworked as well. Nurse did advise patient to continue to evaluate for the concerning signs and symptoms listed above and to call his provider's after hours number or the [facility] 24 hour nurse line if he experiences concerning symptoms. He verbalized understanding.   Plan: RN Health Coach will call patient within the month of July and patient agrees to future outreach calls.   [name] RN, BSN [place] [phone] [name]

## 2019-08-12 DIAGNOSIS — E118 Type 2 diabetes mellitus with unspecified complications: Secondary | ICD-10-CM | POA: Diagnosis not present

## 2019-08-12 DIAGNOSIS — Z794 Long term (current) use of insulin: Secondary | ICD-10-CM | POA: Diagnosis not present

## 2019-08-14 DIAGNOSIS — M25551 Pain in right hip: Secondary | ICD-10-CM | POA: Diagnosis not present

## 2019-08-14 DIAGNOSIS — M6248 Contracture of muscle, other site: Secondary | ICD-10-CM | POA: Diagnosis not present

## 2019-08-15 NOTE — Progress Notes (Signed)
PROVIDER NOTE: Information contained herein reflects review and annotations entered in association with encounter. Interpretation of such information and data should be left to medically-trained personnel. Information provided to patient can be located elsewhere in the medical record under "Patient Instructions". Document created using STT-dictation technology, any transcriptional errors that may result from process are unintentional.    Patient: Alan Schmidt  Service Category: E/M  Provider: Gaspar Cola, MD  DOB: May 25, 1945  DOS: 08/16/2019  Specialty: Interventional Pain Management  MRN: [id number]  Setting: Ambulatory outpatient  PCP: [name], MD  Type: Established Patient    Referring Provider: [name], MD  Location: Office  Delivery: Face-to-face     HPI  Reason for encounter: [name], a 74 y.o. year old male, is here today for evaluation and management of his Chronic pain syndrome [G89.4]. Mr. Sherrer primary complain today is Hip Pain Last encounter: Practice (07/15/2019). My last encounter with him was on 07/15/2019. Pertinent problems: Mr. Hadden has Chronic low back pain (Primary Source of Pain) (Bilateral) (R>L); Lumbar facet syndrome (Bilateral) (R>L); Lumbar spondylosis; Diabetic peripheral neuropathy ([place]); Chronic neck pain (midline over the C7 spinous processes) (L>R); Neurogenic pain; Neuropathic pain; Chronic lower extremity pain (Secondary source of pain) (Bilateral) (R>L); 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) (Right L5 dermatome); History of total hip replacement (Right); Chronic hip pain (Right); Abnormal nerve conduction studies (severe bilateral lower extremity polyneuropathy); Chronic shoulder pain (Bilateral) (R>L); Occipital neuralgia (Left); Cervicogenic headache (Left); Chronic shoulder radicular pain (Left); Chronic cervical radicular pain (Left); Chronic pain syndrome; Lumbar facet joint osteoarthritis (Bilateral);  Chronic headache; Chronic tension-type headache, intractable; Coccygodynia; DDD (degenerative disc disease), lumbar; Chronic musculoskeletal pain; Chronic pain of both knees; Thumb pain (Right); 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 Pain Assessment: Severity of Chronic pain is reported as a 3 /10. Location: Hip Right/Denies. Onset: More than a month ago. Quality: Throbbing. Timing: Constant. Modifying factor(s): meds, laying. Vitals:  height is 6' 1"  (1.854 m) and weight is 259 lb (117.5 kg). His temperature is 98.4 F (36.9 C). His blood pressure is 155/83 (abnormal) and his pulse is 98. His oxygen saturation is 95%.    The patient indicates doing well with the current medication regimen. No adverse reactions or side effects reported to the medications.  Today the patient indicated being very thankful for also having referred him to physical therapy.  He can notice a big difference and he has continued to increase his level of activity thanks to that physical therapy.  We will continue with this for the time being but the patient was also reminded that he needed to learn the exercises and continue to do them on his own.  At this point he describes his pain as being under control and not really needing any interventional therapies, for now.  Today I reviewed the results of his recent UDS.  It describes the patient as having some undisclosed hydrocodone.  However, this was known to [place] since this was the medication that we gave him after the radiofrequency ablation for the acute postop pain.  This confirms that he took the oxycodone and the hydrocodone that I have prescribed for him.  Therefore no problems with compliance.  Pharmacotherapy Assessment   Analgesic: Oxycodone IR 10 mg every 6 hours (40  mg/day) MME/day:60 mg/day.   Monitoring: [place] PMP:  PDMP reviewed during this encounter.       Pharmacotherapy: No side-effects or adverse reactions reported. Compliance: No problems identified. Effectiveness: Clinically acceptable.  Chauncey Fischer, RN  08/16/2019  9:34 AM  Sign when Signing Visit Nursing Pain Medication Assessment:  Safety precautions to be maintained throughout the outpatient stay will include: orient to surroundings, keep bed in low position, maintain call bell within reach at all times, provide assistance with transfer out of bed and ambulation.  Medication Inspection Compliance: Mr. Doubleday did not comply with our request to bring his pills to be counted. He was reminded that bringing the medication bottles, even when empty, is a requirement.  Medication: None brought in. Pill/Patch Count: None available to be counted. Bottle Appearance: No container available. Did not bring bottle(s) to appointment. Filled Date: N/A Last Medication intake:  TodaySafety precautions to be maintained throughout the outpatient stay will include: orient to surroundings, keep bed in low position, maintain call bell within reach at all times, provide assistance with transfer out of bed and ambulation.     UDS:  Summary  Date Value Ref Range Status  05/30/2019 Note  Final    Comment:    ==================================================================== [facility] (MW) ==================================================================== Test                             Result       Flag       Units Drug Present and Declared for Prescription Verification   Alprazolam                     99           EXPECTED   ng/mg creat   Alpha-hydroxyalprazolam        256          EXPECTED   ng/mg creat    Source of alprazolam is a scheduled prescription medication. Alpha-    hydroxyalprazolam is an expected metabolite of alprazolam.   Oxycodone                      1380         EXPECTED    ng/mg creat   Noroxycodone                   1789         EXPECTED   ng/mg creat    Sources of oxycodone include scheduled prescription medications.    Noroxycodone is an expected metabolite of oxycodone. Drug Present not Declared for Prescription Verification   Hydrocodone                    100          UNEXPECTED ng/mg creat   Dihydrocodeine                 69           UNEXPECTED ng/mg creat   Norhydrocodone                 117          UNEXPECTED ng/mg creat    Sources of hydrocodone include scheduled prescription medications.    Dihydrocodeine and norhydrocodone are expected metabolites of    hydrocodone. Dihydrocodeine is also available as a scheduled    prescription medication. ==================================================================== Test  Result    Flag   Units      Ref Range   Creatinine              75               mg/dL      >=20 ==================================================================== Declared Medications:  The flagging and interpretation on this report are based on the  following declared medications.  Unexpected results may arise from  inaccuracies in the declared medications.  **Note: The testing scope of this panel includes these medications:  Alprazolam (Xanax)  Oxycodone  **Note: The testing scope of this panel does not include the  following reported medications:  Acetaminophen (Tylenol)  Amlodipine (Norvasc)  Aspirin  Bupropion (Wellbutrin)  Carvedilol (Coreg)  Desoximetasone (Topicort)  Empagliflozin (Jardiance)  Escitalopram (Lexapro)  Hydrochlorothiazide  Insulin Tyler Aas)  Liraglutide  Loratadine (Claritin)  Lubiprostone (Amitiza)  Meloxicam (Mobic)  Metformin (Glucophage)  Mometasone (Nasonex)  Naloxone (Narcan)  Olmesartan (Benicar)  Pantoprazole (Protonix)  Pravastatin (Pravachol)  Sennosides (Senokot)  Tamsulosin (Flomax)   Topical ==================================================================== For clinical consultation, please call [phone]. ====================================================================      ROS  Constitutional: Denies any fever or chills Gastrointestinal: No reported hemesis, hematochezia, vomiting, or acute GI distress Musculoskeletal: Denies any acute onset joint swelling, redness, loss of ROM, or weakness Neurological: No reported episodes of acute onset apraxia, aphasia, dysarthria, agnosia, amnesia, paralysis, loss of coordination, or loss of consciousness  Medication Review  ALPRAZolam, Benefiber, FreeStyle Libre 14 Day Reader, FreeStyle Libre 14 Day Sensor, Insulin Pen Needle, Oxycodone HCl, acetaminophen, amLODipine, aspirin EC, buPROPion, carvedilol, desoximetasone, empagliflozin, escitalopram, hydrochlorothiazide, ibuprofen, insulin degludec, insulin lispro, liraglutide, loratadine, lubiprostone, meloxicam, metFORMIN, mometasone, naloxone, olmesartan, pantoprazole, pravastatin, sennosides-docusate sodium, and tamsulosin  History Review  Allergy: Mr. Guinta is allergic to contrast media [iodinated diagnostic agents], iodine, and shellfish allergy. Drug: Mr. Salvia  reports no history of drug use. Alcohol:  reports no history of alcohol use. Tobacco:  reports that he has quit smoking. His smoking use included cigarettes. He has never used smokeless tobacco. Social: Mr. Radloff  reports that he has quit smoking. His smoking use included cigarettes. He has never used smokeless tobacco. He reports that he does not drink alcohol and does not use drugs. Medical:  has a past medical history of Acute postoperative pain (11/24/2016), Anxiety, Chronic hip pain (Right) (12/05/2014), Chronic lumbar pain, Depression, Diabetes mellitus without complication ([place]), Hyperlipidemia, Hypertension, Kidney stones, and Migraines. Surgical: Mr. Debes  has a past surgical history that  includes Total hip arthroplasty; Tonsillectomy; and right hip surgery. Family: family history includes Cancer in his mother; Diabetes in his father, sister, and sister; Heart disease in his father; Stroke in his father.  Laboratory Chemistry Profile   Renal Lab Results  Component Value Date   BUN 19 05/15/2019   CREATININE 1.08 05/15/2019   GFR 81.04 05/15/2019   GFRAA >60 03/26/2018   GFRNONAA >60 03/26/2018     Hepatic Lab Results  Component Value Date   AST 23 10/26/2018   ALT 25 10/26/2018   ALBUMIN 4.0 10/26/2018   ALKPHOS 109 10/26/2018     Electrolytes Lab Results  Component Value Date   NA 137 05/15/2019   K 4.3 05/15/2019   CL 100 05/15/2019   CALCIUM 9.3 05/15/2019   MG 1.8 03/06/2015     Bone No results found for: VD25OH, VD125OH2TOT, GX2119ER7, EY8144YJ8, 25OHVITD1, 25OHVITD2, 25OHVITD3, TESTOFREE, TESTOSTERONE   Inflammation (CRP: Acute Phase) (ESR: Chronic Phase) Lab Results  Component  Value Date   CRP 0.5 03/06/2015   ESRSEDRATE 45 (H) 12/14/2016       Note: Above Lab results reviewed.  Recent Imaging Review  DG PAIN CLINIC C-ARM 1-60 MIN NO REPORT Fluoro was used, but no Radiologist interpretation will be provided.  Please refer to "NOTES" tab for provider progress note. Note: Reviewed        Physical Exam  General appearance: Well nourished, well developed, and well hydrated. In no apparent acute distress Mental status: Alert, oriented x 3 (person, place, & time)       Respiratory: No evidence of acute respiratory distress Eyes: PERLA Vitals: BP (!) 155/83    Pulse 98    Temp 98.4 F (36.9 C)    Ht '6\' 1"'$  (1.854 m)    Wt 259 lb (117.5 kg)    SpO2 95%    BMI 34.17 kg/m  BMI: Estimated body mass index is 34.17 kg/m as calculated from the following:   Height as of this encounter: '6\' 1"'$  (1.854 m).   Weight as of this encounter: 259 lb (117.5 kg). Ideal: Ideal body weight: 79.9 kg (176 lb 2.4 oz) Adjusted ideal body weight: 94.9 kg (209 lb 4.6  oz)  Assessment   Status Diagnosis  Controlled Controlled Controlled 1. Chronic pain syndrome   2. Chronic low back pain (Primary Source of Pain) (Bilateral) (R>L)   3. Chronic lower extremity pain (Secondary source of pain) (Bilateral) (R>L)   4. Osteoarthritis involving multiple joints   5. Diabetic peripheral neuropathy (HCC)      Updated Problems: Problem  Acute Postoperative Pain (Resolved)    Plan of Care  Problem-specific:  No problem-specific Assessment & Plan notes found for this encounter.  Mr. ZAREK RELPH has a current medication list which includes the following long-term medication(s): amlodipine, bupropion, carvedilol, escitalopram, hydrochlorothiazide, insulin lispro, loratadine, lubiprostone, meloxicam, metformin, mometasone, olmesartan, [START ON 08/28/2019] oxycodone hcl, [START ON 09/27/2019] oxycodone hcl, [START ON 10/27/2019] oxycodone hcl, pantoprazole, pravastatin, victoza, and benefiber.  Pharmacotherapy (Medications Ordered): Meds ordered this encounter  Medications   Oxycodone HCl 10 MG TABS    Sig: Take 1 tablet (10 mg total) by mouth every 6 (six) hours as needed. Must last 30 days    Dispense:  120 tablet    Refill:  0    Chronic Pain: STOP Act (Not applicable) Fill 1 day early if closed on refill date. Do not fill until: 08/28/2019. To last until: 09/27/2019. Avoid benzodiazepines within 8 hours of opioids   Oxycodone HCl 10 MG TABS    Sig: Take 1 tablet (10 mg total) by mouth every 6 (six) hours as needed. Must last 30 days    Dispense:  120 tablet    Refill:  0    Chronic Pain: STOP Act (Not applicable) Fill 1 day early if closed on refill date. Do not fill until: 09/27/2019. To last until: 10/27/2019. Avoid benzodiazepines within 8 hours of opioids   Oxycodone HCl 10 MG TABS    Sig: Take 1 tablet (10 mg total) by mouth every 6 (six) hours as needed. Must last 30 days    Dispense:  120 tablet    Refill:  0    Chronic Pain: STOP Act (Not  applicable) Fill 1 day early if closed on refill date. Do not fill until: 10/27/2019. To last until: 11/26/2019. Avoid benzodiazepines within 8 hours of opioids   meloxicam (MOBIC) 15 MG tablet    Sig: Take 1 tablet (15 mg total)  by mouth daily.    Dispense:  90 tablet    Refill:  1    Fill one day early if pharmacy is closed on scheduled refill date. May substitute for generic if available.   Orders:  No orders of the defined types were placed in this encounter.  Follow-up plan:   Return in about 14 weeks (around 11/22/2019) for F2F(20-min), MM (never on procedure day).      Interventional management options: Planned, scheduled, and/or pending:   Therapeutic caudal epidural steroid injection under fluoroscopic guidance and IV sedation   Considering:   Diagnostic Left GONB  Possible Left GON RFA    Palliative PRN treatment(s):   Palliative right lumbar facet block #3  Palliative left lumbar facet block #3  Therapeutic left cervical ESI #2  Palliative left lumbar facet RFA #3 (last done on 05/09/2019) Palliative right lumbar facet RFA #6 (last done on 12/27/2018)     Recent Visits Date Type Provider Dept  05/29/19 Telemedicine Milinda Pointer, MD [facility]  Showing recent visits within past 90 days and meeting all other requirements Today's Visits Date Type Provider Dept  08/16/19 Office Visit Milinda Pointer, MD [facility]  Showing today's visits and meeting all other requirements Future Appointments No visits were found meeting these conditions. Showing future appointments within next 90 days and meeting all other requirements  I discussed the assessment and treatment plan with the patient. The patient was provided an opportunity to ask questions and all were answered. The patient agreed with the plan and demonstrated an understanding of the instructions.  Patient advised to call back or seek an in-person evaluation if the symptoms or condition  worsens.  Duration of encounter: 30 minutes.  Note by: Gaspar Cola, MD Date: 08/16/2019; Time: 10:10 AM

## 2019-08-16 ENCOUNTER — Other Ambulatory Visit: Payer: Self-pay

## 2019-08-16 ENCOUNTER — Encounter: Payer: Self-pay | Admitting: Pain Medicine

## 2019-08-16 ENCOUNTER — Ambulatory Visit: Payer: Medicare Other | Attending: Pain Medicine | Admitting: Pain Medicine

## 2019-08-16 VITALS — BP 155/83 | HR 98 | Temp 98.4°F | Ht 73.0 in | Wt 259.0 lb

## 2019-08-16 DIAGNOSIS — M5441 Lumbago with sciatica, right side: Secondary | ICD-10-CM | POA: Diagnosis not present

## 2019-08-16 DIAGNOSIS — G8929 Other chronic pain: Secondary | ICD-10-CM | POA: Diagnosis not present

## 2019-08-16 DIAGNOSIS — M79604 Pain in right leg: Secondary | ICD-10-CM | POA: Diagnosis not present

## 2019-08-16 DIAGNOSIS — M79605 Pain in left leg: Secondary | ICD-10-CM | POA: Diagnosis not present

## 2019-08-16 DIAGNOSIS — M5442 Lumbago with sciatica, left side: Secondary | ICD-10-CM | POA: Insufficient documentation

## 2019-08-16 DIAGNOSIS — E1142 Type 2 diabetes mellitus with diabetic polyneuropathy: Secondary | ICD-10-CM | POA: Diagnosis not present

## 2019-08-16 DIAGNOSIS — G894 Chronic pain syndrome: Secondary | ICD-10-CM | POA: Insufficient documentation

## 2019-08-16 DIAGNOSIS — M159 Polyosteoarthritis, unspecified: Secondary | ICD-10-CM

## 2019-08-16 DIAGNOSIS — M8949 Other hypertrophic osteoarthropathy, multiple sites: Secondary | ICD-10-CM | POA: Diagnosis not present

## 2019-08-16 MED ORDER — OXYCODONE HCL 10 MG PO TABS: 10.0000 mg | ORAL_TABLET | Freq: Four times a day (QID) | ORAL | 0 refills | Status: DC | PRN

## 2019-08-16 MED ORDER — MELOXICAM 15 MG PO TABS: 15.0000 mg | ORAL_TABLET | Freq: Every day | ORAL | 1 refills | Status: DC

## 2019-08-16 NOTE — Patient Instructions (Signed)
____________________________________________________________________________________________  Drug Holidays (Slow)  What is a "Drug Holiday"? Drug Holiday: is the name given to the period of time during which a patient stops taking a medication(s) for the purpose of eliminating tolerance to the drug.  Benefits . Improved effectiveness of opioids. . Decreased opioid dose needed to achieve benefits. . Improved pain with lesser dose.  What is tolerance? Tolerance: is the progressive decreased in effectiveness of a drug due to its repetitive use. With repetitive use, the body gets use to the medication and as a consequence, it loses its effectiveness. This is a common problem seen with opioid pain medications. As a result, a larger dose of the drug is needed to achieve the same effect that used to be obtained with a smaller dose.  How long should a "Drug Holiday" last? You should stay off of the pain medicine for at least 14 consecutive days. (2 weeks)  Should I stop the medicine "cold turkey"? No. You should always coordinate with your Pain Specialist so that he/she can provide you with the correct medication dose to make the transition as smoothly as possible.  How do I stop the medicine? Slowly. You will be instructed to decrease the daily amount of pills that you take by one (1) pill every seven (7) days. This is called a "slow downward taper" of your dose. For example: if you normally take four (4) pills per day, you will be asked to drop this dose to three (3) pills per day for seven (7) days, then to two (2) pills per day for seven (7) days, then to one (1) per day for seven (7) days, and at the end of those last seven (7) days, this is when the "Drug Holiday" would start.   Will I have withdrawals? By doing a "slow downward taper" like this one, it is unlikely that you will experience any significant withdrawal symptoms. Typically, what triggers withdrawals is the sudden stop of a high  dose opioid therapy. Withdrawals can usually be avoided by slowly decreasing the dose over a prolonged period of time.  What are withdrawals? Withdrawals: refers to the wide range of symptoms that occur after stopping or dramatically reducing opiate drugs after heavy and prolonged use. Withdrawal symptoms do not occur to patients that use low dose opioids, or those who take the medication sporadically. Contrary to benzodiazepine (example: Valium, Xanax, etc.) or alcohol withdrawals ("Delirium Tremens"), opioid withdrawals are not lethal. Withdrawals are the physical manifestation of the body getting rid of the excess receptors.  Expected Symptoms Early symptoms of withdrawal may include: . Agitation . Anxiety . Muscle aches . Increased tearing . Insomnia . Runny nose . Sweating . Yawning  Late symptoms of withdrawal may include: . Abdominal cramping . Diarrhea . Dilated pupils . Goose bumps . Nausea . Vomiting  Will I experience withdrawals? Due to the slow nature of the taper, it is very unlikely that you will experience any.  What is a slow taper? Taper: refers to the gradual decrease in dose.  ___________________________________________________________________________________________    ____________________________________________________________________________________________  Medication Rules  Purpose: To inform patients, and their family members, of our rules and regulations.  Applies to: All patients receiving prescriptions (written or electronic).  Pharmacy of record: Pharmacy where electronic prescriptions will be sent. If written prescriptions are taken to a different pharmacy, please inform the nursing staff. The pharmacy listed in the electronic medical record should be the one where you would like electronic prescriptions to be sent.  Electronic   prescriptions: In compliance with the [place] Strengthen Opioid Misuse Prevention (STOP) Act of 2017 (Session  Law 2017-74/H243), effective February 02, 2018, all controlled substances must be electronically prescribed. Calling prescriptions to the pharmacy will cease to exist.  Prescription refills: Only during scheduled appointments. Applies to all prescriptions.  NOTE: The following applies primarily to controlled substances (Opioid* Pain Medications).   Type of encounter (visit): For patients receiving controlled substances, face-to-face visits are required. (Not an option or up to the patient.)  Patient's responsibilities: 1. Pain Pills: Bring all pain pills to every appointment (except for procedure appointments). 2. Pill Bottles: Bring pills in original pharmacy bottle. Always bring the newest bottle. Bring bottle, even if empty. 3. Medication refills: You are responsible for knowing and keeping track of what medications you take and those you need refilled. The day before your appointment: write a list of all prescriptions that need to be refilled. The day of the appointment: give the list to the admitting nurse. Prescriptions will be written only during appointments. No prescriptions will be written on procedure days. If you forget a medication: it will not be "Called in", "Faxed", or "electronically sent". You will need to get another appointment to get these prescribed. No early refills. Do not call asking to have your prescription filled early. 4. Prescription Accuracy: You are responsible for carefully inspecting your prescriptions before leaving our office. Have the discharge nurse carefully go over each prescription with you, before taking them home. Make sure that your name is accurately spelled, that your address is correct. Check the name and dose of your medication to make sure it is accurate. Check the number of pills, and the written instructions to make sure they are clear and accurate. Make sure that you are given enough medication to last until your next medication refill  appointment. 5. Taking Medication: Take medication as prescribed. When it comes to controlled substances, taking less pills or less frequently than prescribed is permitted and encouraged. Never take more pills than instructed. Never take medication more frequently than prescribed.  6. Inform other Doctors: Always inform, all of your healthcare providers, of all the medications you take. 7. Pain Medication from other Providers: You are not allowed to accept any additional pain medication from any other Doctor or Healthcare provider. There are two exceptions to this rule. (see below) In the event that you require additional pain medication, you are responsible for notifying us, as stated below. 8. Medication Agreement: You are responsible for carefully reading and following our Medication Agreement. This must be signed before receiving any prescriptions from our practice. Safely store a copy of your signed Agreement. Violations to the Agreement will result in no further prescriptions. (Additional copies of our Medication Agreement are available upon request.) 9. Laws, Rules, & Regulations: All patients are expected to follow all Federal and State Laws, Statutes, Rules, & Regulations. Ignorance of the Laws does not constitute a valid excuse.  10. Illegal drugs and Controlled Substances: The use of illegal substances (including, but not limited to marijuana and its derivatives) and/or the illegal use of any controlled substances is strictly prohibited. Violation of this rule may result in the immediate and permanent discontinuation of any and all prescriptions being written by our practice. The use of any illegal substances is prohibited. 11. Adopted CDC guidelines & recommendations: Target dosing levels will be at or below 60 MME/day. Use of benzodiazepines** is not recommended.  Exceptions: There are only two exceptions to the rule of not   receiving pain medications from other Healthcare  Providers. 1. Exception #1 (Emergencies): In the event of an emergency (i.e.: accident requiring emergency care), you are allowed to receive additional pain medication. However, you are responsible for: As soon as you are able, call our office [phone], at any time of the day or night, and leave a message stating your name, the date and nature of the emergency, and the name and dose of the medication prescribed. In the event that your call is answered by a member of our staff, make sure to document and save the date, time, and the name of the person that took your information.  2. Exception #2 (Planned Surgery): In the event that you are scheduled by another doctor or dentist to have any type of surgery or procedure, you are allowed (for a period no longer than 30 days), to receive additional pain medication, for the acute post-op pain. However, in this case, you are responsible for picking up a copy of our "Post-op Pain Management for Surgeons" handout, and giving it to your surgeon or dentist. This document is available at our office, and does not require an appointment to obtain it. Simply go to our office during business hours (Monday-Thursday from 8:00 AM to 4:00 PM) (Friday 8:00 AM to 12:00 Noon) or if you have a scheduled appointment with us, prior to your surgery, and ask for it by name. In addition, you will need to provide us with your name, name of your surgeon, type of surgery, and date of procedure or surgery.  *Opioid medications include: morphine, codeine, oxycodone, oxymorphone, hydrocodone, hydromorphone, meperidine, tramadol, tapentadol, buprenorphine, fentanyl, methadone. **Benzodiazepine medications include: diazepam (Valium), alprazolam (Xanax), clonazepam (Klonopine), lorazepam (Ativan), clorazepate (Tranxene), chlordiazepoxide (Librium), estazolam (Prosom), oxazepam (Serax), temazepam (Restoril), triazolam (Halcion) (Last updated:  04/01/2017) ____________________________________________________________________________________________   ____________________________________________________________________________________________  Medication Recommendations and Reminders  Applies to: All patients receiving prescriptions (written and/or electronic).  Medication Rules & Regulations: These rules and regulations exist for your safety and that of others. They are not flexible and neither are we. Dismissing or ignoring them will be considered "non-compliance" with medication therapy, resulting in complete and irreversible termination of such therapy. (See document titled "Medication Rules" for more details.) In all conscience, because of safety reasons, we cannot continue providing a therapy where the patient does not follow instructions.  Pharmacy of record:   Definition: This is the pharmacy where your electronic prescriptions will be sent.   We do not endorse any particular pharmacy.  You are not restricted in your choice of pharmacy.  The pharmacy listed in the electronic medical record should be the one where you want electronic prescriptions to be sent.  If you choose to change pharmacy, simply notify our nursing staff of your choice of new pharmacy.  Recommendations:  Keep all of your pain medications in a safe place, under lock and key, even if you live alone.   After you fill your prescription, take 1 week's worth of pills and put them away in a safe place. You should keep a separate, properly labeled bottle for this purpose. The remainder should be kept in the original bottle. Use this as your primary supply, until it runs out. Once it's gone, then you know that you have 1 week's worth of medicine, and it is time to come in for a prescription refill. If you do this correctly, it is unlikely that you will ever run out of medicine.  To make sure that the above recommendation works,   it is very important that you  make sure your medication refill appointments are scheduled at least 1 week before you run out of medicine. To do this in an effective manner, make sure that you do not leave the office without scheduling your next medication management appointment. Always ask the nursing staff to show you in your prescription , when your medication will be running out. Then arrange for the receptionist to get you a return appointment, at least 7 days before you run out of medicine. Do not wait until you have 1 or 2 pills left, to come in. This is very poor planning and does not take into consideration that we may need to cancel appointments due to bad weather, sickness, or emergencies affecting our staff.  "Partial Fill": If for any reason your pharmacy does not have enough pills/tablets to completely fill or refill your prescription, do not allow for a "partial fill". You will need a separate prescription to fill the remaining amount, which we will not provide. If the reason for the partial fill is your insurance, you will need to talk to the pharmacist about payment alternatives for the remaining tablets, but again, do not accept a partial fill.  Prescription refills and/or changes in medication(s):   Prescription refills, and/or changes in dose or medication, will be conducted only during scheduled medication management appointments. (Applies to both, written and electronic prescriptions.)  No refills on procedure days. No medication will be changed or started on procedure days. No changes, adjustments, and/or refills will be conducted on a procedure day. Doing so will interfere with the diagnostic portion of the procedure.  No phone refills. No medications will be "called into the pharmacy".  No Fax refills.  No weekend refills.  No Holliday refills.  No after hours refills.  Remember:  Business hours are:  Monday to Thursday 8:00 AM to 4:00 PM Provider's Schedule: Milinda Pointer, MD - Appointments  are:  Medication management: Monday and Wednesday 8:00 AM to 4:00 PM Procedure day: Tuesday and Thursday 7:30 AM to 4:00 PM Gillis Santa, MD - Appointments are:  Medication management: Tuesday and Thursday 8:00 AM to 4:00 PM Procedure day: Monday and Wednesday 7:30 AM to 4:00 PM (Last update: 04/01/2017) ____________________________________________________________________________________________

## 2019-08-16 NOTE — Progress Notes (Signed)
Nursing Pain Medication Assessment:  Safety precautions to be maintained throughout the outpatient stay will include: orient to surroundings, keep bed in low position, maintain call bell within reach at all times, provide assistance with transfer out of bed and ambulation.  Medication Inspection Compliance: Alan Schmidt did not comply with our request to bring his pills to be counted. He was reminded that bringing the medication bottles, even when empty, is a requirement.  Medication: None brought in. Pill/Patch Count: None available to be counted. Bottle Appearance: No container available. Did not bring bottle(s) to appointment. Filled Date: N/A Last Medication intake:  TodaySafety precautions to be maintained throughout the outpatient stay will include: orient to surroundings, keep bed in low position, maintain call bell within reach at all times, provide assistance with transfer out of bed and ambulation.

## 2019-08-17 ENCOUNTER — Telehealth: Payer: Self-pay

## 2019-08-17 ENCOUNTER — Ambulatory Visit (INDEPENDENT_AMBULATORY_CARE_PROVIDER_SITE_OTHER): Payer: Medicare Other

## 2019-08-17 VITALS — Ht 73.0 in | Wt 259.0 lb

## 2019-08-17 DIAGNOSIS — Z0000 Encounter for general adult medical examination without abnormal findings: Secondary | ICD-10-CM

## 2019-08-17 DIAGNOSIS — M6248 Contracture of muscle, other site: Secondary | ICD-10-CM | POA: Diagnosis not present

## 2019-08-17 DIAGNOSIS — M25551 Pain in right hip: Secondary | ICD-10-CM | POA: Diagnosis not present

## 2019-08-17 NOTE — Telephone Encounter (Signed)
Post procedure phone call.   

## 2019-08-17 NOTE — Progress Notes (Signed)
Subjective:   Alan Schmidt is a 74 y.o. male who presents for an Initial Medicare Annual Wellness Visit.  Review of Systems    No ROS.  Medicare Wellness Virtual Visit.     Cardiac Risk Factors include: male gender;hypertension;advanced age (>57men, >90 women);diabetes mellitus     Objective:    Today's Vitals   08/17/19 1332  Weight: 259 lb (117.5 kg)  Height: 6\' 1"  (1.854 m)   Body mass index is 34.17 kg/m.  Advanced Directives 08/17/2019 07/26/2019 06/21/2019 05/09/2019 01/31/2019 03/26/2018 09/15/2017  Does Patient Have a Medical Advance Directive? No No No Yes No No No  Type of Advance Directive - - - [place]  Does patient want to make changes to medical advance directive? - - - - - - No - Patient declined  Copy of [place] in Chart? - - - - - - -  Would patient like information on creating a medical advance directive? No - Patient declined Yes (ED - Information included in AVS) Yes (MAU/Ambulatory/Procedural Areas - Information given) - Yes (MAU/Ambulatory/Procedural Areas - Information given) No - Patient declined -    Current Medications (verified) Outpatient Encounter Medications as of 08/17/2019  Medication Sig  . acetaminophen (TYLENOL) 500 MG tablet Take 500 mg by mouth every 6 (six) hours as needed.  . ALPRAZolam (XANAX) 1 MG tablet Take 0.5 tablets (0.5 mg total) by mouth 2 (two) times daily as needed for anxiety.  Marland Kitchen amLODipine (NORVASC) 5 MG tablet TAKE 1 TABLET BY MOUTH EVERY DAY  . aspirin EC 81 MG tablet Take 81 mg by mouth daily.  Marland Kitchen buPROPion (WELLBUTRIN XL) 300 MG 24 hr tablet Take 1 tablet (300 mg total) by mouth daily.  . carvedilol (COREG) 25 MG tablet TAKE 1 TABLET BY MOUTH TWICE A DAY  . Continuous Blood Gluc Receiver (FREESTYLE LIBRE 14 DAY READER) DEVI 1 Device by Does not apply route daily. Use to scan to check blood sugar up to 7 times daily; E11.42,  . Continuous Blood Gluc Sensor (FREESTYLE LIBRE 14  DAY SENSOR) MISC 1 Device by Does not apply route every 14 (fourteen) days. E11.42  . desoximetasone (TOPICORT) 0.25 % cream APPLY CREAM TO AFFECTED AREA TWO TIMES DAILY, FOR UP TO 7 DAYS, DO NOT APPLY TO FACE  . escitalopram (LEXAPRO) 20 MG tablet TAKE 1 TABLET BY MOUTH EVERY DAY  . hydrochlorothiazide (HYDRODIURIL) 25 MG tablet TAKE 1 TABLET BY MOUTH EVERY DAY  . ibuprofen (ADVIL) 800 MG tablet Take 1 tablet (800 mg total) by mouth every 8 (eight) hours as needed for moderate pain.  Marland Kitchen insulin lispro (HUMALOG KWIKPEN) 100 UNIT/ML KwikPen INJECT 7-10 units INTO THE SKIN three times daily with a meal  . Insulin Pen Needle (BD PEN NEEDLE NANO U/F) 32G X 4 MM MISC USE EVERY DAY  . JARDIANCE 25 MG TABS tablet TAKE 1 TABLET BY MOUTH EVERY DAY  . loratadine (CLARITIN) 10 MG tablet TAKE 1 TABLET BY MOUTH EVERY DAY  . lubiprostone (AMITIZA) 8 MCG capsule Take 1 capsule (8 mcg total) by mouth 2 (two) times daily with a meal. Swallow the medication whole. Do not break or chew the medication.  . meloxicam (MOBIC) 15 MG tablet Take 1 tablet (15 mg total) by mouth daily.  . metFORMIN (GLUCOPHAGE) 1000 MG tablet TAKE 1 TABLET BY MOUTH TWICE A DAY WITH A MEAL  . mometasone (NASONEX) 50 MCG/ACT nasal spray INSTILL TWO SPRAYS INTO THE  NOSE DAILY  . naloxone (NARCAN) 2 MG/2ML injection Inject 1 mL (1 mg total) into the muscle as needed for up to 2 doses (for emergency use only). Always have available. Inject into thigh muscle and call 911  . olmesartan (BENICAR) 40 MG tablet TAKE 1 TABLET BY MOUTH EVERY DAY  . [START ON 08/28/2019] Oxycodone HCl 10 MG TABS Take 1 tablet (10 mg total) by mouth every 6 (six) hours as needed. Must last 30 days  . [START ON 09/27/2019] Oxycodone HCl 10 MG TABS Take 1 tablet (10 mg total) by mouth every 6 (six) hours as needed. Must last 30 days  . [START ON 10/27/2019] Oxycodone HCl 10 MG TABS Take 1 tablet (10 mg total) by mouth every 6 (six) hours as needed. Must last 30 days  .  pantoprazole (PROTONIX) 40 MG tablet TAKE 1 TABLET BY MOUTH EVERY DAY  . pravastatin (PRAVACHOL) 40 MG tablet TAKE 1 TABLET BY MOUTH EVERY DAY  . sennosides-docusate sodium (SENOKOT-S) 8.6-50 MG tablet Take 1 tablet by mouth daily as needed.   . tamsulosin (FLOMAX) 0.4 MG CAPS capsule TAKE 1 CAPSULE (0.4 MG TOTAL) BY MOUTH 2 (TWO) TIMES DAILY.  Marland Kitchen TRESIBA FLEXTOUCH 100 UNIT/ML FlexTouch Pen INJECT 40 UNITS INTO THE SKIN DAILY AT 10 PM.  . VICTOZA 18 MG/3ML SOPN INJECT 1.8 MG UNDER THE SKIN ONCE DAILY  . Wheat Dextrin (BENEFIBER) POWD Take 6 g by mouth 3 (three) times daily before meals. (2 tsp = 6 g)   No facility-administered encounter medications on file as of 08/17/2019.    Allergies (verified) Contrast media [iodinated diagnostic agents], Iodine, and Shellfish allergy   History: Past Medical History:  Diagnosis Date  . Acute postoperative pain 11/24/2016  . Anxiety   . Chronic hip pain (Right) 12/05/2014  . Chronic lumbar pain   . Depression   . Diabetes mellitus without complication ([place])   . Hyperlipidemia   . Hypertension   . Kidney stones   . Migraines    Past Surgical History:  Procedure Laterality Date  . right hip surgery     4 surgeries  . TONSILLECTOMY    . TOTAL HIP ARTHROPLASTY     Family History  Problem Relation Age of Onset  . Cancer Mother   . Heart disease Father   . Stroke Father   . Diabetes Father   . Diabetes Sister   . Diabetes Sister    Social History   Socioeconomic History  . Marital status: Divorced    Spouse name: 1 biological child 6 ado  . Number of children: 7  . Years of education: Not on file  . Highest education level: Doctorate  Occupational History  . Occupation: Retired  Tobacco Use  . Smoking status: Former Smoker    Types: Cigarettes  . Smokeless tobacco: Never Used  Vaping Use  . Vaping Use: Never used  Substance and Sexual Activity  . Alcohol use: No    Alcohol/week: 0.0 standard drinks  . Drug use: No  . Sexual  activity: Not Currently  Other Topics Concern  . Not on file  Social History Narrative   Per patient he has 1 biological child and 6 adopted children   Social Determinants of Health   Financial Resource Strain: Low Risk   . Difficulty of Paying Living Expenses: Not very hard  Food Insecurity: No Food Insecurity  . Worried About Charity fundraiser in the Last Year: Never true  . Ran Out of Food  in the Last Year: Never true  Transportation Needs: No Transportation Needs  . Lack of Transportation (Medical): No  . Lack of Transportation (Non-Medical): No  Physical Activity:   . Days of Exercise per Week:   . Minutes of Exercise per Session:   Stress:   . Feeling of Stress :   Social Connections:   . Frequency of Communication with Friends and Family:   . Frequency of Social Gatherings with Friends and Family:   . Attends Religious Services:   . Active Member of Clubs or Organizations:   . Attends Archivist Meetings:   Marland Kitchen Marital Status:     Tobacco Counseling Counseling given: Not Answered   Clinical Intake:  Pre-visit preparation completed: Yes        Diabetes: Yes (Followed by pcp)  How often do you need to have someone help you when you read instructions, pamphlets, or other written materials from your doctor or pharmacy?: 1 - Never   Interpreter Needed?: No      Activities of Daily Living In your present state of health, do you have any difficulty performing the following activities: 08/17/2019 07/26/2019  Hearing? N N  Vision? N N  Comment - -  Difficulty concentrating or making decisions? N N  Walking or climbing stairs? Y Y  Comment - decrease mobility and multiple falls  Dressing or bathing? N N  Doing errands, shopping? N Y  Comment - patient states he does this independently but due to ambulatory status it is difficult  Preparing Food and eating ? N Y  Comment - patient states he does a lot of microwave due to ambulatory status  Using  the Toilet? N N  In the past six months, have you accidently leaked urine? N N  Do you have problems with loss of bowel control? N N  Managing your Medications? Y N  Comment Bubble packed doses from pharmacy -  Managing your Finances? N N  Housekeeping or managing your Housekeeping? N Y  Comment - patient states he is limited due to his health and ambulatory status  Some recent data might be hidden    Patient Care Team: [name], MD as PCP - General (Family Medicine) De Hollingshead, [facility] as Pharmacist (Pharmacist) Laretta Alstrom Argie Ramming, RN as [place] Management [place], Maree Erie, LCSW as [place] any recent [place] you may have received from other than [facility] providers in the past year (date may be approximate).     Assessment:   This is a routine wellness examination for [place].  I connected with [place] today by telephone and verified that I am speaking with the correct person using two identifiers. Location patient: home Location provider: work Persons participating in the virtual visit: patient, Marine scientist.    I discussed the limitations, risks, security and privacy concerns of performing an evaluation and management service by telephone and the availability of in person appointments. The patient expressed understanding and verbally consented to this telephonic visit.    Interactive audio and video telecommunications were attempted between this provider and patient, however failed, due to patient having technical difficulties OR patient did not have access to video capability.  We continued and completed visit with audio only.  Some vital signs may be absent or patient reported.   Hearing/Vision screen  Hearing Screening   125Hz  250Hz  500Hz  1000Hz  2000Hz  3000Hz  4000Hz  6000Hz  8000Hz   Right ear:  Left ear:           Comments: Patient is able to hear conversational tones without difficulty.  No  issues reported.  Vision Screening Comments: Wears corrective lenses Cataract extraction, bilateral No retinopathy reported Visual acuity not assessed, virtual visit.  They have seen their ophthalmologist in the last 12 months.     Dietary issues and exercise activities discussed: Current Exercise Habits: Home exercise routine, Time (Minutes): > 60, Frequency (Times/Week): 2, Weekly Exercise (Minutes/Week): 0, Intensity: Mild  Healthy diabetic diet; portion control Good water intake Caffeine -occasional tea  Goals      Patient Stated   .  "I want to keep an eye on my diabetes" (pt-stated)      Current Barriers:  . Diabetes: uncontrolled though improved per CGM results; most recent A1c 8.4% o Really appreciating the knowledge he is learning from diabetic educator.  o Notes that going to physical therapy has been very helpful for him building strength, mobility . Current antihyperglycemic regimen: metformin 1000 mg BID, Jardiance 25 mg daily, Victoza 1.8 mg daily, Tresiba 25 units daily, Humalog 7-10 units based upon expected carbohydrates; if low, taking 1-3 units.  . Current blood glucose readings: see above o Date of Download: 6/18-08/03/19 o % Time CGM is active: 98% o Average Glucose: 128 (average over the previous few months has been 140-150s) o Glucose Variability: 23.6 (goal <36%) o Time in Goal:  o - Time in range 70-180: 95% o - Time above range: 5% o - Time below range: 0% . Cardiovascular risk reduction: o Current hypertensive regimen: amlodipine 5 mg daily, carvedilol 25 mg BID, HCTZ 25 mg daily, olmesartan 40 mg daily;  o Current hyperlipidemia regimen: pravastatin 40 mg daily; LDL well controlled last check <70 . Mental health concerns: escitalopram 20 mg daily, alprazolam 0.5 mg PRN; bupropion XL 300 mg daily . Chronic pain: Oxycodone 10 mg 6H PRN, meloxicam 15 mg daily; follows w/ pain management. Working on Barrister's clerk)  Pharmacist Clinical  Goal(s):  Marland Kitchen Over the next 90 days, patient with work with PharmD and primary care provider to address optimized diabetes management  Interventions: . Comprehensive medication review performed, medication list updated in electronic medical record . Inter-disciplinary care team collaboration (see longitudinal plan of care) . Reviewed differences between blood glucose and interstitial glucose.  . Due for A1c later this month. Continue current regimen at this time.  . Encouraged continued collaboration w/ PT and dietician to focus on lifestyle interventions to improve health and glycemic control.  . Reiterated role of LCSW and RN CM on team. Patient verbalized understanding.   Patient Self Care Activities:  . Patient will check blood glucose with CGM, document, and provide at future appointment . Patient will take medications as prescribed . Patient will report any questions or concerns to provider   Please see past updates related to this goal by clicking on the "Past Updates" button in the selected goal        Depression Screen PHQ 2/9 Scores 08/17/2019 07/26/2019 06/21/2019 06/19/2019 05/15/2019 03/30/2019 01/31/2019  PHQ - 2 Score 0 0 0 1 0 1 1  PHQ- 9 Score - - - - - - -  Exception Documentation - - - - - - -  Not completed - - - - - - -    Fall Risk Fall Risk  08/16/2019 07/26/2019 07/20/2019 07/13/2019 07/06/2019  Falls in the past year? 0 1 1 1 1   Comment - - no falls  since 04/2019 no falls since last visit no falls since 04/2019  Number falls in past yr: - 1 - 1 -  Injury with Fall? - 1 - - -  Comment - did not go to hospital but very sore for several weeks - - -  Risk for fall due to : - Impaired mobility;Impaired balance/gait;History of fall(s) - - -  Risk for fall due to: Comment - - - - -  Follow up - Falls prevention discussed;Education provided;Falls evaluation completed - - -    Handrails in use when using handrails? Yes  Home free of loose throw rugs in walkways, pet beds,  electrical cords, etc? Yes  Adequate lighting in your home to reduce risk of falls? Yes   ASSISTIVE DEVICES UTILIZED TO PREVENT FALLS:  Use of a cane, walker or w/c? Yes  Grab bars in the bathroom? Yes  Shower chair or bench in shower? No  Elevated toilet seat or a handicapped toilet? No    Notes he uses the sink to help raise him after toileting. Discussed 3 in 1 over toilet (removal BSC seat) with railing to assist with standing. If he decides to go this route he will discuss Rx for DME with PCP.   TIMED UP AND GO:  Was the test performed? No .   Cognitive Function: MMSE - Mini Mental State Exam 08/17/2019  Not completed: Unable to complete  Patient is alert and oriented x3.  Denies difficulty focusing and with memory.   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    Immunizations Immunization History  Administered Date(s) Administered  . Fluad Quad(high Dose 65+) 10/26/2018  . Influenza, High Dose Seasonal PF 11/21/2015, 10/27/2016, 11/06/2017  . Moderna SARS-COVID-2 Vaccination 03/23/2019, 04/20/2019  . Pneumococcal Conjugate-13 11/21/2015  . Pneumococcal Polysaccharide-23 10/21/2017  . Tdap 04/09/2015   Health Maintenance Health Maintenance  Topic Date Due  . COLONOSCOPY  09/09/2019 (Originally 02/14/1995)  . Hepatitis C Screening  08/16/2020 (Originally 08/16/1945)  . INFLUENZA VACCINE  09/03/2019  . OPHTHALMOLOGY EXAM  11/10/2019  . HEMOGLOBIN A1C  11/14/2019  . COLON CANCER SCREENING ANNUAL FOBT  05/24/2020  . FOOT EXAM  06/25/2020  . TETANUS/TDAP  04/08/2025  . COVID-19 Vaccine  Completed  . PNA vac Low Risk Adult  Completed   Hepatitis C Screening- deferred due to patient preference. Educational informational mailed per patient preference.   Dental Screening: Recommended annual dental exams for proper oral hygiene. Dentures.   Community Resource Referral / Chronic Care Management: CRR required this visit?  No   CCM required this visit?  No        Plan:   Keep all routine maintenance appointments.   Follow up 08/25/19 @ 8:30  CCM 10/16/19 @ 2:45  I have personally reviewed and noted the following in the patient's chart:   . Medical and social history . Use of alcohol, tobacco or illicit drugs  . Current medications and supplements . Functional ability and status . Nutritional status . Physical activity . Advanced directives . List of other physicians . Hospitalizations, surgeries, and ER visits in previous 12 months . Vitals . Screenings to include cognitive, depression, and falls . Referrals and appointments  In addition, I have reviewed and discussed with patient certain preventive protocols, quality metrics, and best practice recommendations. A written personalized care plan for preventive services as well as general preventive health recommendations were provided to patient via mychart.     OBrien-Blaney, Jedrek Dinovo L, LPN  08/17/2019       

## 2019-08-17 NOTE — Telephone Encounter (Signed)
error 

## 2019-08-17 NOTE — Patient Instructions (Addendum)
Alan Schmidt , Thank you for taking time to come for your Medicare Wellness Visit. I appreciate your ongoing commitment to your health goals. Please review the following plan we discussed and let me know if I can assist you in the future.   These are the goals we discussed: Goals      Patient Stated   .  "I want to keep an eye on my diabetes" (pt-stated)      Current Barriers:  . Diabetes: uncontrolled though improved per CGM results; most recent A1c 8.4% o Really appreciating the knowledge he is learning from diabetic educator.  o Notes that going to physical therapy has been very helpful for him building strength, mobility . Current antihyperglycemic regimen: metformin 1000 mg BID, Jardiance 25 mg daily, Victoza 1.8 mg daily, Tresiba 25 units daily, Humalog 7-10 units based upon expected carbohydrates; if low, taking 1-3 units.  . Current blood glucose readings: see above o Date of Download: 6/18-08/03/19 o % Time CGM is active: 98% o Average Glucose: 128 (average over the previous few months has been 140-150s) o Glucose Variability: 23.6 (goal <36%) o Time in Goal:  o - Time in range 70-180: 95% o - Time above range: 5% o - Time below range: 0% . Cardiovascular risk reduction: o Current hypertensive regimen: amlodipine 5 mg daily, carvedilol 25 mg BID, HCTZ 25 mg daily, olmesartan 40 mg daily;  o Current hyperlipidemia regimen: pravastatin 40 mg daily; LDL well controlled last check <70 . Mental health concerns: escitalopram 20 mg daily, alprazolam 0.5 mg PRN; bupropion XL 300 mg daily . Chronic pain: Oxycodone 10 mg 6H PRN, meloxicam 15 mg daily; follows w/ pain management. Working on Barrister's clerk)  Pharmacist Clinical Goal(s):  Marland Kitchen Over the next 90 days, patient with work with PharmD and primary care provider to address optimized diabetes management  Interventions: . Comprehensive medication review performed, medication list updated in electronic medical  record . Inter-disciplinary care team collaboration (see longitudinal plan of care) . Reviewed differences between blood glucose and interstitial glucose.  . Due for A1c later this month. Continue current regimen at this time.  . Encouraged continued collaboration w/ PT and dietician to focus on lifestyle interventions to improve health and glycemic control.  . Reiterated role of LCSW and RN CM on team. Patient verbalized understanding.   Patient Self Care Activities:  . Patient will check blood glucose with CGM, document, and provide at future appointment . Patient will take medications as prescribed . Patient will report any questions or concerns to provider   Please see past updates related to this goal by clicking on the "Past Updates" button in the selected goal         This is a list of the screening recommended for you and due dates:  Health Maintenance  Topic Date Due  . Colon Cancer Screening  09/09/2019*  .  Hepatitis C: One time screening is recommended by Center for Disease Control  (CDC) for  adults born from [birth year] through 1964.   08/16/2020*  . Flu Shot  09/03/2019  . Eye exam for diabetics  11/10/2019  . Hemoglobin A1C  11/14/2019  . Stool Blood Test  05/24/2020  . Complete foot exam   06/25/2020  . Tetanus Vaccine  04/08/2025  . COVID-19 Vaccine  Completed  . Pneumonia vaccines  Completed  *Topic was postponed. The date shown is not the original due date.    Immunizations Immunization History  Administered Date(s) Administered  .  Fluad Quad(high Dose 65+) 10/26/2018  . Influenza, High Dose Seasonal PF 11/21/2015, 10/27/2016, 11/06/2017  . Moderna SARS-COVID-2 Vaccination 03/23/2019, 04/20/2019  . Pneumococcal Conjugate-13 11/21/2015  . Pneumococcal Polysaccharide-23 10/21/2017  . Tdap 04/09/2015   Keep all routine maintenance appointments.   Follow up 08/25/19 @ 8:30  CCM 10/16/19 @ 2:45   Advanced directives: Mailed to patient per request.   Hepatitis  C Screening- Educational information mailed to patient.   Follow up in one year for your annual wellness visit.   Preventive Care 2 Years and Older, Male Preventive care refers to lifestyle choices and visits with your health care provider that can promote health and wellness. What does preventive care include?  A yearly physical exam. This is also called an annual well check.  Dental exams once or twice a year.  Routine eye exams. Ask your health care provider how often you should have your eyes checked.  Personal lifestyle choices, including:  Daily care of your teeth and gums.  Regular physical activity.  Eating a healthy diet.  Avoiding tobacco and drug use.  Limiting alcohol use.  Practicing safe sex.  Taking low-dose aspirin every day.  Taking vitamin and mineral supplements as recommended by your health care provider. What happens during an annual well check? The services and screenings done by your health care provider during your annual well check will depend on your age, overall health, lifestyle risk factors, and family history of disease. Counseling  Your health care provider may ask you questions about your:  Alcohol use.  Tobacco use.  Drug use.  Emotional well-being.  Home and relationship well-being.  Sexual activity.  Eating habits.  History of falls.  Memory and ability to understand (cognition).  Work and work Statistician.  Reproductive health. Screening  You may have the following tests or measurements:  Height, weight, and BMI.  Blood pressure.  Lipid and cholesterol levels. These may be checked every 5 years, or more frequently if you are over 63 years old.  Skin check.  Lung cancer screening. You may have this screening every year starting at age 63 if you have a 30-pack-year history of smoking and currently smoke or have quit within the past 15 years.  Fecal occult blood test (FOBT) of the stool. You may have this test  every year starting at age 36.  Flexible sigmoidoscopy or colonoscopy. You may have a sigmoidoscopy every 5 years or a colonoscopy every 10 years starting at age 77.  Hepatitis C blood test.  Hepatitis B blood test.  Sexually transmitted disease (STD) testing.  Diabetes screening. This is done by checking your blood sugar (glucose) after you have not eaten for a while (fasting). You may have this done every 1-3 years.  Bone density scan. This is done to screen for osteoporosis. You may have this done starting at age 87.  Mammogram. This may be done every 1-2 years. Talk to your health care provider about how often you should have regular mammograms. Talk with your health care provider about your test results, treatment options, and if necessary, the need for more tests. Vaccines  Your health care provider may recommend certain vaccines, such as:  Influenza vaccine. This is recommended every year.  Tetanus, diphtheria, and acellular pertussis (Tdap, Td) vaccine. You may need a Td booster every 10 years.  Zoster vaccine. You may need this after age 15.  Pneumococcal 13-valent conjugate (PCV13) vaccine. One dose is recommended after age 32.  Pneumococcal polysaccharide (PPSV23) vaccine. One  dose is recommended after age 57. Talk to your health care provider about which screenings and vaccines you need and how often you need them. This information is not intended to replace advice given to you by your health care provider. Make sure you discuss any questions you have with your health care provider. Document Released: 02/15/2015 Document Revised: 10/09/2015 Document Reviewed: 11/20/2014 Elsevier Interactive Patient Education  2017 [place] Prevention in the Home Falls can cause injuries. They can happen to people of all ages. There are many things you can do to make your home safe and to help prevent falls. What can I do on the outside of my home?  Regularly fix the edges  of walkways and driveways and fix any cracks.  Remove anything that might make you trip as you walk through a door, such as a raised step or threshold.  Trim any bushes or trees on the path to your home.  Use bright outdoor lighting.  Clear any walking paths of anything that might make someone trip, such as rocks or tools.  Regularly check to see if handrails are loose or broken. Make sure that both sides of any steps have handrails.  Any raised decks and porches should have guardrails on the edges.  Have any leaves, snow, or ice cleared regularly.  Use sand or salt on walking paths during winter.  Clean up any spills in your garage right away. This includes oil or grease spills. What can I do in the bathroom?  Use night lights.  Install grab bars by the toilet and in the tub and shower. Do not use towel bars as grab bars.  Use non-skid mats or decals in the tub or shower.  If you need to sit down in the shower, use a plastic, non-slip stool.  Keep the floor dry. Clean up any water that spills on the floor as soon as it happens.  Remove soap buildup in the tub or shower regularly.  Attach bath mats securely with double-sided non-slip rug tape.  Do not have throw rugs and other things on the floor that can make you trip. What can I do in the bedroom?  Use night lights.  Make sure that you have a light by your bed that is easy to reach.  Do not use any sheets or blankets that are too big for your bed. They should not hang down onto the floor.  Have a firm chair that has side arms. You can use this for support while you get dressed.  Do not have throw rugs and other things on the floor that can make you trip. What can I do in the kitchen?  Clean up any spills right away.  Avoid walking on wet floors.  Keep items that you use a lot in easy-to-reach places.  If you need to reach something above you, use a strong step stool that has a grab bar.  Keep electrical  cords out of the way.  Do not use floor polish or wax that makes floors slippery. If you must use wax, use non-skid floor wax.  Do not have throw rugs and other things on the floor that can make you trip. What can I do with my stairs?  Do not leave any items on the stairs.  Make sure that there are handrails on both sides of the stairs and use them. Fix handrails that are broken or loose. Make sure that handrails are as long as the stairways.  Check any carpeting to make sure that it is firmly attached to the stairs. Fix any carpet that is loose or worn.  Avoid having throw rugs at the top or bottom of the stairs. If you do have throw rugs, attach them to the floor with carpet tape.  Make sure that you have a light switch at the top of the stairs and the bottom of the stairs. If you do not have them, ask someone to add them for you. What else can I do to help prevent falls?  Wear shoes that:  Do not have high heels.  Have rubber bottoms.  Are comfortable and fit you well.  Are closed at the toe. Do not wear sandals.  If you use a stepladder:  Make sure that it is fully opened. Do not climb a closed stepladder.  Make sure that both sides of the stepladder are locked into place.  Ask someone to hold it for you, if possible.  Clearly mark and make sure that you can see:  Any grab bars or handrails.  First and last steps.  Where the edge of each step is.  Use tools that help you move around (mobility aids) if they are needed. These include:  Canes.  Walkers.  Scooters.  Crutches.  Turn on the lights when you go into a dark area. Replace any light bulbs as soon as they burn out.  Set up your furniture so you have a clear path. Avoid moving your furniture around.  If any of your floors are uneven, fix them.  If there are any pets around you, be aware of where they are.  Review your medicines with your doctor. Some medicines can make you feel dizzy. This can  increase your chance of falling. Ask your doctor what other things that you can do to help prevent falls. This information is not intended to replace advice given to you by your health care provider. Make sure you discuss any questions you have with your health care provider. Document Released: 11/15/2008 Document Revised: 06/27/2015 Document Reviewed: 02/23/2014 Elsevier Interactive Patient Education  2017 [employer].

## 2019-08-22 ENCOUNTER — Telehealth: Payer: Self-pay | Admitting: Family Medicine

## 2019-08-22 ENCOUNTER — Other Ambulatory Visit: Payer: Self-pay | Admitting: Family Medicine

## 2019-08-22 NOTE — Chronic Care Management (AMB) (Signed)
  Care Management   Note  08/22/2019 Name: Alan Schmidt MRN: [id number] DOB: 1945-07-02  Alan Schmidt is a 74 y.o. year old male who is a primary care patient of [name], MD and is actively engaged with the care management team. I reached out to [name] by phone today to assist with re-scheduling a follow up visit with the Pharmacist  Follow up plan: Unsuccessful telephone outreach attempt made. A HIPPA compliant phone message was left for the patient providing contact information and requesting a return call.  The care management team will reach out to the patient again over the next 7 days.  If patient returns call to provider office, please advise to call [facility]  at [place], [place], [place], [place] [address] Direct Dial: [phone] [email or web address] Website: [email or web address]

## 2019-08-23 NOTE — Telephone Encounter (Signed)
Refill request for Xanax, last seen 08-02-19, last filled 07-05-19.  Please advise.

## 2019-08-23 NOTE — Telephone Encounter (Signed)
Pt called to inquire about alprazolam . Pt is out of meds

## 2019-08-24 NOTE — Chronic Care Management (AMB) (Signed)
  Care Management   Note  08/24/2019 Name: Alan Schmidt MRN: [id number] DOB: [birth year]  Alan Schmidt is a 74 y.o. year old male who is a primary care patient of [name], MD and is actively engaged with the care management team. I reached out to [name] by phone today to assist with re-scheduling a follow up visit with the Pharmacist  Follow up plan: Telephone appointment with care management team member scheduled for:10/18/2019  Noreene Larsson, [place], [place], [place] [address] Direct Dial: [phone] [email or web address] Website: [email or web address]

## 2019-08-25 ENCOUNTER — Ambulatory Visit: Payer: Medicare Other | Admitting: Family Medicine

## 2019-08-25 DIAGNOSIS — M6248 Contracture of muscle, other site: Secondary | ICD-10-CM | POA: Diagnosis not present

## 2019-08-25 DIAGNOSIS — M25551 Pain in right hip: Secondary | ICD-10-CM | POA: Diagnosis not present

## 2019-08-26 ENCOUNTER — Other Ambulatory Visit: Payer: Self-pay | Admitting: Family Medicine

## 2019-08-28 ENCOUNTER — Other Ambulatory Visit: Payer: Self-pay | Admitting: *Deleted

## 2019-08-28 NOTE — Patient Outreach (Signed)
[facility]) Care Management  [facility] Social Work  08/28/2019  SHALIK SANFILIPPO [birth year] [id number]  Subjective: Successful telephone outreach call to patient. HIPAA identifiers obtained. Patient states he is doing fairly well. His pain has improved, he is at a level 5 today in his right hip and patient feels that PT is improving his strength and mobility. Patient reports that he did not get the rollator walker which was ordered by PCP 07/26/19. Nurse followed-up and called [employer]. A representative explained that they were not able to reach the patient when they called originally. She added that she would call the patient right away and would have the deliverer of the walker adjust the rollator to the patient's height when they delivered the DME. Representative also stated she would call nurse back if she was not able to reach patient to set up a delivery. Patient inquired about the CSW referral that this nurse placed on 07/26/19. Nurse explained that [facility] CSW did call the patient 3 times and left a VM with her contact information and that she did not hear back from the patient. Patient apologized and states that he would like to speak with a CSW about getting an aide placed in his home to help him with ADLs and IADLs.  Nurse will place a CSW referral.   Patient reports that he is doing well with his diabetes control. He has been seeing a RD, states his average FreeStyle Libre reading is 119. He did voice concern about his blood sugar dropping to per patient around 50 twice at night while when he is sleeping. Nurse educated that he should start to eat a snack at night around bedtime with protein to help to prevent this and nurse will send patient Glucerna coupons. Patient did express motivation to start to do chair exercises at home in conjunction with going to PT to increase his physical activity.   Encounter Medications:  Outpatient Encounter Medications as of 08/28/2019   Medication Sig Note  . acetaminophen (TYLENOL) 500 MG tablet Take 500 mg by mouth every 6 (six) hours as needed. 06/05/2019: Taking PRN, up to a couple times daily   . ALPRAZolam (XANAX) 1 MG tablet TAKE 0.5 TABLETS BY MOUTH 2 TIMES DAILY AS NEEDED FOR ANXIETY.   Marland Kitchen amLODipine (NORVASC) 5 MG tablet TAKE 1 TABLET BY MOUTH EVERY DAY   . aspirin EC 81 MG tablet Take 81 mg by mouth daily.   Marland Kitchen buPROPion (WELLBUTRIN XL) 300 MG 24 hr tablet Take 1 tablet (300 mg total) by mouth daily.   . carvedilol (COREG) 25 MG tablet TAKE 1 TABLET BY MOUTH TWICE A DAY   . Continuous Blood Gluc Receiver (FREESTYLE LIBRE 14 DAY READER) DEVI 1 Device by Does not apply route daily. Use to scan to check blood sugar up to 7 times daily; E11.42,   . Continuous Blood Gluc Sensor (FREESTYLE LIBRE 14 DAY SENSOR) MISC 1 Device by Does not apply route every 14 (fourteen) days. E11.42   . desoximetasone (TOPICORT) 0.25 % cream APPLY CREAM TO AFFECTED AREA TWO TIMES DAILY, FOR UP TO 7 DAYS, DO NOT APPLY TO FACE   . escitalopram (LEXAPRO) 20 MG tablet TAKE 1 TABLET BY MOUTH EVERY DAY   . hydrochlorothiazide (HYDRODIURIL) 25 MG tablet TAKE 1 TABLET BY MOUTH EVERY DAY   . ibuprofen (ADVIL) 800 MG tablet Take 1 tablet (800 mg total) by mouth every 8 (eight) hours as needed for moderate pain.   Marland Kitchen insulin  lispro (HUMALOG KWIKPEN) 100 UNIT/ML KwikPen INJECT 7-10 units INTO THE SKIN three times daily with a meal 06/26/2019: 1-10 units, generally ~7 units  . Insulin Pen Needle (BD PEN NEEDLE NANO U/F) 32G X 4 MM MISC USE EVERY DAY   . JARDIANCE 25 MG TABS tablet TAKE 1 TABLET BY MOUTH EVERY DAY   . loratadine (CLARITIN) 10 MG tablet TAKE 1 TABLET BY MOUTH EVERY DAY   . lubiprostone (AMITIZA) 8 MCG capsule Take 1 capsule (8 mcg total) by mouth 2 (two) times daily with a meal. Swallow the medication whole. Do not break or chew the medication. 06/05/2019: PRN 2-3 times weekly   . meloxicam (MOBIC) 15 MG tablet Take 1 tablet (15 mg total) by mouth  daily. 08/16/2019: FUTURE Prescription. (NOT a DUPLICATE!!) >>>DO NOT DELETE<<< (even if Expired!) See Care Coordination Note from [facility] Pain Management (Dr. Dossie Arbour)    . metFORMIN (GLUCOPHAGE) 1000 MG tablet TAKE 1 TABLET BY MOUTH TWICE A DAY WITH MEALS   . mometasone (NASONEX) 50 MCG/ACT nasal spray INSTILL TWO SPRAYS INTO THE NOSE DAILY   . naloxone (NARCAN) 2 MG/2ML injection Inject 1 mL (1 mg total) into the muscle as needed for up to 2 doses (for emergency use only). Always have available. Inject into thigh muscle and call 911 07/26/2019: Does not have an order at this time  . olmesartan (BENICAR) 40 MG tablet TAKE 1 TABLET BY MOUTH EVERY DAY   . Oxycodone HCl 10 MG TABS Take 1 tablet (10 mg total) by mouth every 6 (six) hours as needed. Must last 30 days 08/16/2019: FUTURE Prescription. (NOT a DUPLICATE!!) >>>DO NOT DELETE<<< (even if Expired!) See Care Coordination Note from [facility] Pain Management (Dr. Dossie Arbour)  . [START ON 09/27/2019] Oxycodone HCl 10 MG TABS Take 1 tablet (10 mg total) by mouth every 6 (six) hours as needed. Must last 30 days 08/16/2019: FUTURE Prescription. (NOT a DUPLICATE!!) >>>DO NOT DELETE<<< (even if Expired!) See Care Coordination Note from [facility] Management (Dr. Dossie Arbour)  . [START ON 10/27/2019] Oxycodone HCl 10 MG TABS Take 1 tablet (10 mg total) by mouth every 6 (six) hours as needed. Must last 30 days 08/16/2019: FUTURE Prescription. (NOT a DUPLICATE!!) >>>DO NOT DELETE<<< (even if Expired!) See Care Coordination Note from [facility] Pain Management (Dr. Dossie Arbour)  . pantoprazole (PROTONIX) 40 MG tablet TAKE 1 TABLET BY MOUTH EVERY DAY   . pravastatin (PRAVACHOL) 40 MG tablet TAKE 1 TABLET BY MOUTH EVERY DAY   . sennosides-docusate sodium (SENOKOT-S) 8.6-50 MG tablet Take 1 tablet by mouth daily as needed.    . tamsulosin (FLOMAX) 0.4 MG CAPS capsule TAKE 1 CAPSULE (0.4 MG TOTAL) BY MOUTH 2 (TWO) TIMES DAILY.   Marland Kitchen TRESIBA FLEXTOUCH 100 UNIT/ML FlexTouch Pen INJECT 40 UNITS INTO  THE SKIN DAILY AT 10 PM. 06/26/2019: 25 units  . VICTOZA 18 MG/3ML SOPN INJECT 1.8 MG UNDER THE SKIN ONCE DAILY   . Wheat Dextrin (BENEFIBER) POWD Take 6 g by mouth 3 (three) times daily before meals. (2 tsp = 6 g)    No facility-administered encounter medications on file as of 08/28/2019.    Functional Status:  In your present state of health, do you have any difficulty performing the following activities: 08/17/2019 07/26/2019  Hearing? N N  Vision? N N  Comment - -  Difficulty concentrating or making decisions? N N  Walking or climbing stairs? Y Y  Comment - decrease mobility and multiple falls  Dressing or bathing? N N  Doing errands, shopping? N Y  Comment - patient states he does this independently but due to ambulatory status it is difficult  Preparing Food and eating ? N Y  Comment - patient states he does a lot of microwave due to ambulatory status  Using the Toilet? N N  In the past six months, have you accidently leaked urine? N N  Do you have problems with loss of bowel control? N N  Managing your Medications? Y N  Comment Bubble packed doses from pharmacy -  Managing your Finances? N N  Housekeeping or managing your Housekeeping? N Y  Comment - patient states he is limited due to his health and ambulatory status  Some recent data might be hidden    Fall/Depression Screening:  PHQ 2/9 Scores 08/17/2019 07/26/2019 06/21/2019 06/19/2019 05/15/2019 03/30/2019 01/31/2019  PHQ - 2 Score 0 0 0 1 0 1 1  PHQ- 9 Score - - - - - - -  Exception Documentation - - - - - - -  Not completed - - - - - - -   Goals Addressed            This Visit's Progress   . Patient will report an A1c reduction of 0.5-1.0 points within the next 90 days       [place] (see longtitudinal plan of care for additional care plan information)  Objective:  Lab Results  Component Value Date   HGBA1C 8.4 (H) 05/15/2019 .   Lab Results  Component Value Date   CREATININE 1.08 05/15/2019    CREATININE 1.04 10/26/2018   CREATININE 0.94 06/10/2018 .   Marland Kitchen No results found for: EGFR  Current Barriers:  Marland Kitchen Knowledge Deficits related to basic Diabetes pathophysiology and self care/management  Case Manager Clinical Goal(s):  Over the next 90 days, patient will demonstrate improved adherence to prescribed treatment plan for diabetes self care/management as evidenced by:  Marland Kitchen Verbalize adherence to ADA/ carb modified diet within the next 90 days . Verbalize exercise 2-3 other than physical therapy days/week  Interventions:  . Reviewed medications with patient and discussed importance of medication adherence . Discussed plans with patient for ongoing care management follow up and provided patient with direct contact information for care management team . Encouraged patient to do chair exercise 2-3 times weekly, discussed eating a snack at night with protein to prevent hypoglycemia during sleeping hours, nurse will send Glucerna coupons , will send exercise program booklet  Patient Self Care Activities:  . Self administers oral medications as prescribed . Self administers insulin as prescribed . Self administers injectable DM medication (Victoza) as prescribed . Checks blood sugars as prescribed and utilize hyper and hypoglycemia protocol as needed . Adheres to prescribed ADA/carb modified         Plan: [place] will place a referral to [place], will send patient exercise booklet and Glucerna coupons, will call patient within the month of September, and patient agrees to future outreach calls.   [name] RN, BSN [place] [phone] [email or web address]

## 2019-08-29 DIAGNOSIS — M6248 Contracture of muscle, other site: Secondary | ICD-10-CM | POA: Diagnosis not present

## 2019-08-29 DIAGNOSIS — M25551 Pain in right hip: Secondary | ICD-10-CM | POA: Diagnosis not present

## 2019-08-30 DIAGNOSIS — R2689 Other abnormalities of gait and mobility: Secondary | ICD-10-CM | POA: Diagnosis not present

## 2019-08-31 DIAGNOSIS — M25551 Pain in right hip: Secondary | ICD-10-CM | POA: Diagnosis not present

## 2019-08-31 DIAGNOSIS — M6248 Contracture of muscle, other site: Secondary | ICD-10-CM | POA: Diagnosis not present

## 2019-09-01 ENCOUNTER — Other Ambulatory Visit: Payer: Self-pay | Admitting: Family Medicine

## 2019-09-06 ENCOUNTER — Encounter: Payer: Self-pay | Admitting: *Deleted

## 2019-09-06 ENCOUNTER — Other Ambulatory Visit: Payer: Self-pay | Admitting: *Deleted

## 2019-09-06 NOTE — Patient Outreach (Signed)
[facility]) Care Management  09/06/2019  Alan Schmidt Jun 10, 1945 [id number]    CSW made a fourth and final attempt to try and contact patient today to perform the initial phone assessment, as well as assess and assist with social work needs and services; however, patient was unavailable at the time of CSW's call.  CSW has left HIPAA compliant messages on voicemail for patient, while awaiting a return call.  CSW encouraged patient's RNCM, also with [name], [name] to have patient contact CSW directly, providing patient with CSW's contact information, if she was successful in her outreach attempts to patient, which she did.  Patient agreed to contact CSW, but CSW never received a call or voicemail message from patient.  CSW will proceed with case closure, as required number of phone attempts have been made and Unsuccessful Patient Outreach Letter was mailed to patient's home, allowing more than a month for patient to respond, if he was interested in receiving social work services and resources through [place] with Scientist, clinical (histocompatibility and immunogenetics).  CSW will notify patient's Primary Care Physician, [name] of CSW's plans to close patient's case, as well as send a Physician Case Closure Letter.  CSW will also mail a Case Closure Letter to patient's home.  Last, CSW will route this note to Mrs. Wine.    [name], BSW, MSW, LCSW  Licensed Education officer, environmental Health System  Mailing [place] [address], [place], [place] [address] Physical Address-[address], [place], [place] [address] Toll Free Main # [phone] Fax # [phone] Cell # [phone]  Office # [phone] [name]

## 2019-09-07 DIAGNOSIS — M6248 Contracture of muscle, other site: Secondary | ICD-10-CM | POA: Diagnosis not present

## 2019-09-07 DIAGNOSIS — M25551 Pain in right hip: Secondary | ICD-10-CM | POA: Diagnosis not present

## 2019-09-11 ENCOUNTER — Ambulatory Visit: Payer: Medicare Other | Admitting: Pharmacist

## 2019-09-11 DIAGNOSIS — E1142 Type 2 diabetes mellitus with diabetic polyneuropathy: Secondary | ICD-10-CM

## 2019-09-11 NOTE — Patient Instructions (Signed)
Visit Information  Goals Addressed              This Visit's Progress     Patient Stated   .  "I want to keep an eye on my diabetes" (pt-stated)        Current Barriers:  . Social, financial, and community needs o Indicates appreciating the support from the [facility] team . Diabetes: uncontrolled though improved per CGM results; most recent A1c 8.4% . Current antihyperglycemic regimen: metformin 1000 mg BID, Jardiance 25 mg daily, Victoza 1.8 mg daily, Tresiba 25 units daily, Humalog 7-10 units based upon expected carbohydrates; if low, taking 1-3 units.  . Current blood glucose readings: patient calls today noting that he placed a new FreeStyle Libre sensor on Saturday, and pressed the center of the sensor after applying to help w/ adhesion. Notes he had sharp pain that persisted through Sunday, but is improving today. Denies redness. Notes that sensor is still reading his glucose, which is confirmed via [facility] download Date of Download: 7/27-09/11/19 % Time CGM is active: 92% Average Glucose: 132 mg/dL Glucose Management Indicator: 6.5% Glucose Variability: 23.8 (goal <36%) Time in Goal:  - Time in range 70-180: 92% - Time above range: 8% - Time below range: 0% Observed patterns: occasional post-lunch spikes . Cardiovascular risk reduction: o Current hypertensive regimen: amlodipine 5 mg daily, carvedilol 25 mg BID, HCTZ 25 mg daily, olmesartan 40 mg daily;  o Current hyperlipidemia regimen: pravastatin 40 mg daily; LDL well controlled last check <70 . Mental health concerns: escitalopram 20 mg daily, alprazolam 0.5 mg PRN; bupropion XL 300 mg daily . Chronic pain: Oxycodone 10 mg 6H PRN, meloxicam 15 mg daily; follows w/ pain management. Working on Barrister's clerk)  Pharmacist Clinical Goal(s):  Marland Kitchen Over the next 90 days, patient with work with PharmD and primary care provider to address optimized diabetes management  Interventions: . Comprehensive medication review  performed, medication list updated in electronic medical record . Inter-disciplinary care team collaboration (see longitudinal plan of care) . Sensor is still reading and reporting glucose, so filament did not break off of sensor. Could be that he accidentally hit a sensitive spot. Encouraged him to remove this sensor and call [employer] to request a replacement. Encouraged to call us back if any s/sx infection are apparent at the site (redness, discoloration, raised edge). He verbalized understanding  Patient Self Care Activities:  . Patient will check blood glucose with CGM, document, and provide at future appointment . Patient will take medications as prescribed . Patient will report any questions or concerns to provider   Please see past updates related to this goal by clicking on the "Past Updates" button in the selected goal         The patient verbalized understanding of instructions provided today and declined a print copy of patient instruction materials.    Plan:  - Will outreach as previously scheduled  [name], PharmD, [place], [place] Pharmacist [place] [phone]

## 2019-09-11 NOTE — Chronic Care Management (AMB) (Signed)
Chronic Care Management   Follow Up Note   09/11/2019 Name: Alan Schmidt MRN: [id number] DOB: 09/18/1945  Referred by: [name], MD Reason for referral : Chronic Care Management (Medication Management)   Alan Schmidt is a 74 y.o. year old male who is a primary care patient of [name], [name], MD. The CCM team was consulted for assistance with chronic disease management and care coordination needs.    Contacted patient for medication management review.  Review of patient status, including review of consultants reports, relevant laboratory and other test results, and collaboration with appropriate care team members and the patient's provider was performed as part of comprehensive patient evaluation and provision of chronic care management services.    SDOH (Social Determinants of Health) assessments performed: Yes See Care Plan activities for detailed interventions related to [name]     Outpatient Encounter Medications as of 09/11/2019  Medication Sig Note  . acetaminophen (TYLENOL) 500 MG tablet Take 500 mg by mouth every 6 (six) hours as needed. 06/05/2019: Taking PRN, up to a couple times daily   . ALPRAZolam (XANAX) 1 MG tablet TAKE 0.5 TABLETS BY MOUTH 2 TIMES DAILY AS NEEDED FOR ANXIETY.   Marland Kitchen amLODipine (NORVASC) 5 MG tablet TAKE 1 TABLET BY MOUTH EVERY DAY   . aspirin EC 81 MG tablet Take 81 mg by mouth daily.   Marland Kitchen buPROPion (WELLBUTRIN XL) 300 MG 24 hr tablet Take 1 tablet (300 mg total) by mouth daily.   . carvedilol (COREG) 25 MG tablet TAKE 1 TABLET BY MOUTH TWICE A DAY   . Continuous Blood Gluc Receiver (FREESTYLE LIBRE 14 DAY READER) DEVI 1 Device by Does not apply route daily. Use to scan to check blood sugar up to 7 times daily; E11.42,   . Continuous Blood Gluc Sensor (FREESTYLE LIBRE 14 DAY SENSOR) MISC 1 Device by Does not apply route every 14 (fourteen) days. E11.42   . desoximetasone (TOPICORT) 0.25 % cream APPLY CREAM TO AFFECTED AREA TWO TIMES DAILY,  FOR UP TO 7 DAYS, DO NOT APPLY TO FACE   . escitalopram (LEXAPRO) 20 MG tablet TAKE 1 TABLET BY MOUTH EVERY DAY   . hydrochlorothiazide (HYDRODIURIL) 25 MG tablet TAKE 1 TABLET BY MOUTH EVERY DAY   . ibuprofen (ADVIL) 800 MG tablet TAKE 1 TABLET (800 MG TOTAL) BY MOUTH EVERY 8 (EIGHT) HOURS AS NEEDED FOR MODERATE PAIN.   Marland Kitchen insulin lispro (HUMALOG KWIKPEN) 100 UNIT/ML KwikPen INJECT 7-10 units INTO THE SKIN three times daily with a meal 06/26/2019: 1-10 units, generally ~7 units  . Insulin Pen Needle (BD PEN NEEDLE NANO U/F) 32G X 4 MM MISC USE EVERY DAY   . JARDIANCE 25 MG TABS tablet TAKE 1 TABLET BY MOUTH EVERY DAY   . loratadine (CLARITIN) 10 MG tablet TAKE 1 TABLET BY MOUTH EVERY DAY   . lubiprostone (AMITIZA) 8 MCG capsule Take 1 capsule (8 mcg total) by mouth 2 (two) times daily with a meal. Swallow the medication whole. Do not break or chew the medication. 06/05/2019: PRN 2-3 times weekly   . meloxicam (MOBIC) 15 MG tablet Take 1 tablet (15 mg total) by mouth daily. 08/16/2019: FUTURE Prescription. (NOT a DUPLICATE!!) >>>DO NOT DELETE<<< (even if Expired!) See Care Coordination Note from [facility] (Dr. Dossie Arbour)    . metFORMIN (GLUCOPHAGE) 1000 MG tablet TAKE 1 TABLET BY MOUTH TWICE A DAY WITH MEALS   . mometasone (NASONEX) 50 MCG/ACT nasal spray INSTILL TWO SPRAYS INTO THE NOSE  DAILY   . naloxone (NARCAN) 2 MG/2ML injection Inject 1 mL (1 mg total) into the muscle as needed for up to 2 doses (for emergency use only). Always have available. Inject into thigh muscle and call 911 07/26/2019: Does not have an order at this time  . olmesartan (BENICAR) 40 MG tablet TAKE 1 TABLET BY MOUTH EVERY DAY   . Oxycodone HCl 10 MG TABS Take 1 tablet (10 mg total) by mouth every 6 (six) hours as needed. Must last 30 days 08/16/2019: FUTURE Prescription. (NOT a DUPLICATE!!) >>>DO NOT DELETE<<< (even if Expired!) See Care Coordination Note from [facility] (Dr. Dossie Arbour)  . [START ON 09/27/2019]  Oxycodone HCl 10 MG TABS Take 1 tablet (10 mg total) by mouth every 6 (six) hours as needed. Must last 30 days 08/16/2019: FUTURE Prescription. (NOT a DUPLICATE!!) >>>DO NOT DELETE<<< (even if Expired!) See Care Coordination Note from [facility] (Dr. Dossie Arbour)  . [START ON 10/27/2019] Oxycodone HCl 10 MG TABS Take 1 tablet (10 mg total) by mouth every 6 (six) hours as needed. Must last 30 days 08/16/2019: FUTURE Prescription. (NOT a DUPLICATE!!) >>>DO NOT DELETE<<< (even if Expired!) See Care Coordination Note from [facility] (Dr. Dossie Arbour)  . pantoprazole (PROTONIX) 40 MG tablet TAKE 1 TABLET BY MOUTH EVERY DAY   . pravastatin (PRAVACHOL) 40 MG tablet TAKE 1 TABLET BY MOUTH EVERY DAY   . sennosides-docusate sodium (SENOKOT-S) 8.6-50 MG tablet Take 1 tablet by mouth daily as needed.    . tamsulosin (FLOMAX) 0.4 MG CAPS capsule TAKE 1 CAPSULE (0.4 MG TOTAL) BY MOUTH 2 (TWO) TIMES DAILY.   Marland Kitchen TRESIBA FLEXTOUCH 100 UNIT/ML FlexTouch Pen INJECT 40 UNITS INTO THE SKIN DAILY AT 10 PM. 06/26/2019: 25 units  . VICTOZA 18 MG/3ML SOPN INJECT 1.8 MG UNDER THE SKIN ONCE DAILY   . Wheat Dextrin (BENEFIBER) POWD Take 6 g by mouth 3 (three) times daily before meals. (2 tsp = 6 g)    No facility-administered encounter medications on file as of 09/11/2019.     Objective:   Goals Addressed              This Visit's Progress     Patient Stated   .  "I want to keep an eye on my diabetes" (pt-stated)        Current Barriers:  . Social, financial, and community needs o Indicates appreciating the support from the [facility] team . Diabetes: uncontrolled though improved per CGM results; most recent A1c 8.4% . Current antihyperglycemic regimen: metformin 1000 mg BID, Jardiance 25 mg daily, Victoza 1.8 mg daily, Tresiba 25 units daily, Humalog 7-10 units based upon expected carbohydrates; if low, taking 1-3 units.  . Current blood glucose readings: patient calls today noting that he placed a new FreeStyle  Libre sensor on Saturday, and pressed the center of the sensor after applying to help w/ adhesion. Notes he had sharp pain that persisted through Sunday, but is improving today. Denies redness. Notes that sensor is still reading his glucose, which is confirmed via [facility] download Date of Download: 7/27-09/11/19 % Time CGM is active: 92% Average Glucose: 132 mg/dL Glucose Management Indicator: 6.5% Glucose Variability: 23.8 (goal <36%) Time in Goal:  - Time in range 70-180: 92% - Time above range: 8% - Time below range: 0% Observed patterns: occasional post-lunch spikes . Cardiovascular risk reduction: o Current hypertensive regimen: amlodipine 5 mg daily, carvedilol 25 mg BID, HCTZ 25 mg daily, olmesartan 40 mg daily;  o Current hyperlipidemia regimen: pravastatin 40 mg daily; LDL well controlled last check <70 . Mental health concerns: escitalopram 20 mg daily, alprazolam 0.5 mg PRN; bupropion XL 300 mg daily . Chronic pain: Oxycodone 10 mg 6H PRN, meloxicam 15 mg daily; follows w/ pain management. Working on Barrister's clerk)  Pharmacist Clinical Goal(s):  Marland Kitchen Over the next 90 days, patient with work with PharmD and primary care provider to address optimized diabetes management  Interventions: . Comprehensive medication review performed, medication list updated in electronic medical record . Inter-disciplinary care team collaboration (see longitudinal plan of care) . Sensor is still reading and reporting glucose, so filament did not break off of sensor. Could be that he accidentally hit a sensitive spot. Encouraged him to remove this sensor and call [employer] to request a replacement. Encouraged to call us back if any s/sx infection are apparent at the site (redness, discoloration, raised edge). He verbalized understanding  Patient Self Care Activities:  . Patient will check blood glucose with CGM, document, and provide at future appointment . Patient will take medications as  prescribed . Patient will report any questions or concerns to provider   Please see past updates related to this goal by clicking on the "Past Updates" button in the selected goal          Plan:  - Will outreach as previously scheduled  [name], PharmD, [place], [place] Pharmacist [place] [phone]

## 2019-09-12 DIAGNOSIS — M6248 Contracture of muscle, other site: Secondary | ICD-10-CM | POA: Diagnosis not present

## 2019-09-12 DIAGNOSIS — E118 Type 2 diabetes mellitus with unspecified complications: Secondary | ICD-10-CM | POA: Diagnosis not present

## 2019-09-12 DIAGNOSIS — M25551 Pain in right hip: Secondary | ICD-10-CM | POA: Diagnosis not present

## 2019-09-12 DIAGNOSIS — Z794 Long term (current) use of insulin: Secondary | ICD-10-CM | POA: Diagnosis not present

## 2019-09-14 DIAGNOSIS — M6248 Contracture of muscle, other site: Secondary | ICD-10-CM | POA: Diagnosis not present

## 2019-09-14 DIAGNOSIS — M25551 Pain in right hip: Secondary | ICD-10-CM | POA: Diagnosis not present

## 2019-09-18 ENCOUNTER — Other Ambulatory Visit: Payer: Self-pay | Admitting: Family Medicine

## 2019-09-19 ENCOUNTER — Ambulatory Visit: Payer: Medicare Other | Admitting: Family Medicine

## 2019-09-19 DIAGNOSIS — M25551 Pain in right hip: Secondary | ICD-10-CM | POA: Diagnosis not present

## 2019-09-19 DIAGNOSIS — M6248 Contracture of muscle, other site: Secondary | ICD-10-CM | POA: Diagnosis not present

## 2019-09-21 DIAGNOSIS — M25551 Pain in right hip: Secondary | ICD-10-CM | POA: Diagnosis not present

## 2019-09-21 DIAGNOSIS — M6248 Contracture of muscle, other site: Secondary | ICD-10-CM | POA: Diagnosis not present

## 2019-09-25 ENCOUNTER — Encounter: Payer: Self-pay | Admitting: *Deleted

## 2019-09-25 ENCOUNTER — Other Ambulatory Visit: Payer: Self-pay | Admitting: *Deleted

## 2019-09-25 NOTE — Patient Outreach (Signed)
[place] [facility]) Care Management  09/25/2019  AVIR DERUITER 11/08/1945 [id number]   CSW received a new referral on patient from patient's [facility] Coach, also with [place] Management, [name].  Mrs. Wine indicated that patient would benefit from social work services and resources to assist with arranging in-home care services.  CSW made another initial attempt to try and contact patient today to perform the phone assessment, as well as assess and assist with social work needs and services.  However, patient was unavailable at the time of CSW's call.  CSW left a HIPAA compliant message on voicemail for patient and is currently awaiting a return call.  CSW will make a second outreach attempt within the next 3-4 business days, on Friday, September 29, 2019, around 11:00am, if a return call is not received from patient in the meantime.  CSW will also mail a Patient Unsuccessful Outreach Letter to patient's home, requesting that patient contact CSW if he is interested in receiving social work services and resources through [place] with Scientist, clinical (histocompatibility and immunogenetics).  [name], BSW, MSW, LCSW  Licensed Education officer, environmental Health System  Mailing [place] [address] [place], [place] [address] Physical Address-[address] [place], [place] [address] Toll Free Main # [phone] Fax # [phone] Cell # [phone]  [name]

## 2019-09-26 ENCOUNTER — Other Ambulatory Visit: Payer: Self-pay | Admitting: Family Medicine

## 2019-09-26 ENCOUNTER — Ambulatory Visit: Payer: Self-pay | Admitting: *Deleted

## 2019-09-26 DIAGNOSIS — M25551 Pain in right hip: Secondary | ICD-10-CM | POA: Diagnosis not present

## 2019-09-26 DIAGNOSIS — M6248 Contracture of muscle, other site: Secondary | ICD-10-CM | POA: Diagnosis not present

## 2019-09-27 ENCOUNTER — Telehealth: Payer: Self-pay | Admitting: Pain Medicine

## 2019-09-27 NOTE — Telephone Encounter (Signed)
Patient called to get meds refilled and was told they do not have his medications and there is a Producer, television/film/video.  Please advise patient what he can do

## 2019-09-28 ENCOUNTER — Telehealth: Payer: Self-pay | Admitting: *Deleted

## 2019-09-28 ENCOUNTER — Other Ambulatory Visit: Payer: Self-pay | Admitting: Pain Medicine

## 2019-09-28 DIAGNOSIS — K5903 Drug induced constipation: Secondary | ICD-10-CM

## 2019-09-28 DIAGNOSIS — G894 Chronic pain syndrome: Secondary | ICD-10-CM

## 2019-09-28 DIAGNOSIS — M25551 Pain in right hip: Secondary | ICD-10-CM | POA: Diagnosis not present

## 2019-09-28 DIAGNOSIS — M6248 Contracture of muscle, other site: Secondary | ICD-10-CM | POA: Diagnosis not present

## 2019-09-28 MED ORDER — BENEFIBER PO POWD: 6.0000 g | Freq: Three times a day (TID) | ORAL | 2 refills | Status: DC

## 2019-09-28 MED ORDER — MORPHINE SULFATE ER 30 MG PO TBCR: 30.0000 mg | EXTENDED_RELEASE_TABLET | Freq: Two times a day (BID) | ORAL | 0 refills | Status: DC

## 2019-09-28 MED ORDER — LUBIPROSTONE 8 MCG PO CAPS: 8.0000 ug | ORAL_CAPSULE | Freq: Two times a day (BID) | ORAL | 2 refills | Status: DC

## 2019-09-28 NOTE — Telephone Encounter (Signed)
Voicemail left with patient that we are aware of the oxycodone shortage and that Dr Dossie Arbour is switching these folks that are taking oxycodone to MS Contin if they do have any contraindications.  Explained that I do not see any allergies to Morphine and wanted to make sure that he has not taken this before and had problems.  Asked to please call back to answer these questions and then I would send message to Dr Dossie Arbour to get this changed.

## 2019-09-28 NOTE — Telephone Encounter (Signed)
Pharmacy called and oxycodone scripts cancelled per Dr. Dossie Arbour order.

## 2019-09-28 NOTE — Progress Notes (Signed)
The patient was unable to get his oxycodone at [facility] due to the national shortage.  The patient indicated that he has no problems with the morphine and therefore we have switched him to MS Contin.  He was taking oxycodone IR 10 mg 1 tablet p.o. every 6 hours (40 mg/day of oxycodone) (60 MME).  Today I have sent 3 prescriptions to the pharmacy for MS Contin 30 mg, 1 tablet p.o. twice daily (60 mg/day of morphine) (60 MME).  Transfer: Weight dextran (Benefiber) powder, 6 g p.o. 3 times daily before meals (730 g/month) (12/01/2019); lubiprostone (Amitiza) 8 mcg capsule, 1 capsule p.o. twice daily with meals (60/month) (11/26/2019); meloxicam (Mobic) 15 mg tablet, 1 tablet p.o. daily in a.m. (30 tablets/month) (02/12/2020)

## 2019-09-28 NOTE — Telephone Encounter (Signed)
Patient returned call. States no issues or allergies with MS contin. Pharmacy on file was confirmed. Please change to MS contin. Thank you.

## 2019-09-29 ENCOUNTER — Encounter: Payer: Self-pay | Admitting: *Deleted

## 2019-09-29 ENCOUNTER — Other Ambulatory Visit: Payer: Self-pay | Admitting: *Deleted

## 2019-09-29 NOTE — Patient Outreach (Signed)
[place] [facility]) Care Management  09/29/2019  Alan Schmidt 1945/11/27 [id number]   CSW made a second attempt to try and contact patient today to perform the initial phone assessment, as well as assess and assist with social work needs and services, without success.  Patient's phone does not even ring, before going straight to voicemail.  A HIPAA compliant message was left for patient and CSW continues to await a return call.  CSW will make a third outreach attempt within the next 3-4 business days, on Wednesday, October 04, 2019, around 9:00am, if a return call is not received from patient in the meantime.  CSW will also mail a Patient Unsuccessful Outreach Letter to patient's home, requesting that patient contact CSW at his earliest convenience, if he is interested in receiving social work services through [place] with [facility].    [name], BSW, MSW, LCSW  Licensed Education officer, environmental Health System  Mailing [place] [address] [place], [place] [address] Physical Address-[address], [place], [place] [address] Toll Free Main # [phone] Fax # [phone] Cell # [phone]  [name]

## 2019-10-03 ENCOUNTER — Telehealth: Payer: Self-pay | Admitting: Family Medicine

## 2019-10-03 NOTE — Telephone Encounter (Signed)
Patient called in for a refill on ALPRAZolam (XANAX) 1 MG tablet

## 2019-10-04 ENCOUNTER — Other Ambulatory Visit: Payer: Self-pay | Admitting: *Deleted

## 2019-10-04 MED ORDER — ALPRAZOLAM 1 MG PO TABS
ORAL_TABLET | ORAL | 0 refills | Status: DC
Start: 2019-10-04 — End: 2019-11-01

## 2019-10-04 NOTE — Telephone Encounter (Signed)
Refill sent to pharmacy.   

## 2019-10-04 NOTE — Patient Outreach (Signed)
[facility]) Care Management  10/04/2019  Alan Schmidt November 30, 1945 [id number]    CSW made a third attempt to try and contact patient today to perform the initial phone assessment, as well as assess and assist with social work needs and services, without success.  A HIPAA compliant message was left for patient on voicemail.  CSW is currently awaiting a return call.  CSW will make a fourth and final outreach attempt within the next four weeks, if a return call is not received from patient in the meantime.  If fourth and final outreach attempt is unsuccessful, CSW will proceed with case closure, as required number of phone attempts will have been made and an Unsuccessful Patient Outreach Letter was mailed to patient's home, allowing more than four weeks for a response if patient was interested in receiving social work services through [place] with [facility]) [facility].  CSW will notify patient's [place], also with [facility] Care Management, [name] of CSW's inability to establish initial contact with patient, encouraging Mrs. Wine to have patient contact CSW directly.  [name], BSW, MSW, LCSW  Licensed Education officer, environmental Health System  Mailing [place] [address] [place], [place] [address] Physical Address-[address], [place], [place] [address] Toll Free Main # [phone] Fax # [phone] Cell # [phone]  [name]

## 2019-10-05 ENCOUNTER — Other Ambulatory Visit: Payer: Self-pay | Admitting: *Deleted

## 2019-10-05 ENCOUNTER — Telehealth: Payer: Self-pay

## 2019-10-05 NOTE — Patient Outreach (Signed)
[place] [facility]) Care Management  10/05/2019  Alan Schmidt 02-Jul-1945 [id number]  Unsuccessful outreach attempt made to patient. RN Health Coach left HIPAA compliant voicemail informing patient that [place] has been outreaching to him unsuccessfully. Nurse did leave CSW's contact number [phone] and asked that he call her directly.   Plan: RN Health Coach will call patient within the month of September.  [name] RN, BSN [place] [phone] [email or web address]

## 2019-10-05 NOTE — Telephone Encounter (Signed)
FYI and do you need to see him F2F or can this be a virtual?

## 2019-10-05 NOTE — Telephone Encounter (Signed)
Spoke with Dr Holley Raring who gave verbal order to instruct patient to reduce MS contin to once a day.  Spoke with patient who states that he was only taking it once a day and it was lasting too long and causing him anxiety.  States he has xanax but only has 2 pills left.  Patient does not have an appointment with Dr Dossie Arbour until November.  Please try to get this patient an appointment with Dr Dossie Arbour to discuss the medication.

## 2019-10-05 NOTE — Telephone Encounter (Signed)
Patient called and left message that he is not tolerating the medication change to MS Contin.  He states that it gives him anxiety attacks and would like to discuss with Dr Dossie Arbour.

## 2019-10-12 ENCOUNTER — Other Ambulatory Visit: Payer: Self-pay | Admitting: *Deleted

## 2019-10-12 DIAGNOSIS — M6248 Contracture of muscle, other site: Secondary | ICD-10-CM | POA: Diagnosis not present

## 2019-10-12 DIAGNOSIS — M25551 Pain in right hip: Secondary | ICD-10-CM | POA: Diagnosis not present

## 2019-10-12 NOTE — Patient Outreach (Addendum)
[place] [facility]) Care Management  10/12/2019  CRISPIN VOGEL Feb 22, 1945 [id number]  Unsuccessful outreach attempt made to patient. RN Health Coach left HIPAA compliant voicemail message along with her contact information.  Plan: RN Health Coach will call patient within the month of October.  [name] RN, BSN [place] [phone] [email or web address]

## 2019-10-16 ENCOUNTER — Telehealth: Payer: Self-pay

## 2019-10-16 ENCOUNTER — Telehealth: Payer: Medicare Other

## 2019-10-16 NOTE — Telephone Encounter (Signed)
This patient will need a VV prior to RFA order. Last seen here in mid July. Please call and schedule. Thank you.

## 2019-10-16 NOTE — Telephone Encounter (Signed)
He wants to get another RFA. Last one done 05/09/19. If this is ok, please put in an order

## 2019-10-17 DIAGNOSIS — E118 Type 2 diabetes mellitus with unspecified complications: Secondary | ICD-10-CM | POA: Diagnosis not present

## 2019-10-17 DIAGNOSIS — Z794 Long term (current) use of insulin: Secondary | ICD-10-CM | POA: Diagnosis not present

## 2019-10-18 ENCOUNTER — Encounter: Payer: Self-pay | Admitting: *Deleted

## 2019-10-18 ENCOUNTER — Other Ambulatory Visit: Payer: Self-pay | Admitting: *Deleted

## 2019-10-18 ENCOUNTER — Telehealth: Payer: Medicare Other

## 2019-10-18 ENCOUNTER — Telehealth: Payer: Self-pay | Admitting: Pharmacist

## 2019-10-18 NOTE — Patient Outreach (Signed)
[facility]) Care Management  10/18/2019  Alan Schmidt 1945/09/20 [id number]   CSW received a return call from patient today, in response to the HIPAA compliant messages left on voicemail for patient by CSW on 07/27/2019, 08/01/2019, 08/09/2019, 09/06/2019, 09/25/2019, 09/29/2019 and 10/04/2019.  CSW was able to perform the initial phone assessment with patient, as well as assess and assist with social work needs and services.  CSW introduced self, explained role and types of services provided through [facility] ([facility]).  CSW further explained to patient that CSW works with patient's [place]ia, also with [facility], [name].  CSW then explained the reason for the call, indicating that Mrs. Wine thought that patient would benefit from social work services and resources to assist with arranging in-home care services for patient.  CSW obtained two HIPAA compliant identifiers from patient, which included patient's name and date of birth.  Patient indicated that he currently lives at home alone and is able to perform all activities of daily living independently, but would like assistance with meal preparation, light housekeeping duties, laundry, etc.  CSW explained to patient that because he is an active Adult Medicaid recipient, he is able to apply for [employer] Youth worker), through [employer], and CAPS Forensic scientist), through the [facility].  Per patient's request, CSW will e-mail ([email or web address]) him, as well as mail him, the following list of resources and applications:  PCS Instructions; PCS Application; PCS Providers; CAPS Instructions; CAPS Application; Resources for Seniors.  Patient was able to confirm receipt of the resources sent to him by CSW via e-mail, prior to terminating the call.  Patient reported that he is able to  afford to pay for his prescription medications, and takes them exactly as prescribed.  Patient further reported that he has access to transportation to get to and from all of his physician appointments, and denies having food insecurities.  CSW offered to assist patient with completion of his Advanced Directives (Living Will and [place] documents), but patient declined, indicating that he will be having that conversation with his Primary Care Physician, [name], during their next scheduled visit.  Patient also intends to address his code status with Dr. [name].  CSW agreed to follow-up with patient again next week, on Wednesday, October 25, 2019, around 9:00am, to ensure that he received the packet of resource information and applications mailed to his home by CSW, as well as assist with application completion and submission.  [name], BSW, MSW, LCSW  Licensed Education officer, environmental Health System  Mailing [place] [address] [place], [place] [address] Physical Address-[address] [place], [place] [address] Toll Free Main # [phone] Fax # [phone] Cell # [phone]  [name]

## 2019-10-18 NOTE — Telephone Encounter (Signed)
°  Chronic Care Management   Note  10/18/2019 Name: Alan Schmidt MRN: [id number] DOB: 23-Feb-1945   Attempted to contact patient for scheduled appointment for medication management support. Left HIPAA compliant message for patient to return my call at their convenience.    Plan: - If I do not hear back from the patient by end of business today, will collaborate with Care Guide to outreach to schedule follow up with me   [name], PharmD, [place], [place] Pharmacist [place] [phone]

## 2019-10-20 NOTE — Telephone Encounter (Signed)
Pt has been r/s for 12/07/2019. Pt is also aware that he needs to call PCP for appt

## 2019-10-24 ENCOUNTER — Other Ambulatory Visit: Payer: Self-pay | Admitting: Pain Medicine

## 2019-10-25 ENCOUNTER — Other Ambulatory Visit: Payer: Self-pay | Admitting: *Deleted

## 2019-10-25 NOTE — Patient Outreach (Signed)
[facility]) Care Management  10/25/2019  Alan Schmidt 1945/10/13 [id number]   CSW made an attempt to try and contact patient today to follow-up regarding social work services and resources, as well as to ensure that patient received the resource information and applications that [facility] emailed to him last week, per his request.  However, patient was unavailable at the time of CSW's call.  CSW left a HIPAA compliant message on voicemail for patient and is currently awaiting a return call.  CSW will make a second outreach attempt within the next 3-4 business days, if a return call is not received from patient in the meantime.    [name], BSW, MSW, LCSW  Licensed Education officer, environmental Health System  Mailing [place] [address], [place], [place] [address] Physical Address[address], [place], [place] [address] Toll Free Main # [phone] Fax # [phone] Cell # [phone]  [name]

## 2019-10-26 ENCOUNTER — Other Ambulatory Visit: Payer: Self-pay | Admitting: Family Medicine

## 2019-10-27 ENCOUNTER — Other Ambulatory Visit: Payer: Self-pay | Admitting: *Deleted

## 2019-10-27 ENCOUNTER — Telehealth: Payer: Self-pay | Admitting: Family Medicine

## 2019-10-27 ENCOUNTER — Other Ambulatory Visit: Payer: Self-pay

## 2019-10-27 DIAGNOSIS — M6248 Contracture of muscle, other site: Secondary | ICD-10-CM | POA: Diagnosis not present

## 2019-10-27 DIAGNOSIS — M25551 Pain in right hip: Secondary | ICD-10-CM | POA: Diagnosis not present

## 2019-10-27 NOTE — Patient Outreach (Signed)
[facility]) Care Management  10/27/2019  Alan Schmidt 1946/01/09 [id number]  Unsuccessful outreach attempt made to patient. Nurse received a message that the patient called [facility] nurse-line with complaints of constant hamstring pain in both legs; nurse called patient to further assess and to see if he had any further questions or concerns. RN Health Coach left HIPAA compliant voicemail message along with her contact information.  During typing this note patient did call nurse back. Successful telephone outreach call to patient. HIPAA identifiers obtained. Patient states that he felt overwhelmed this morning because he is out of some of his pain medication and began to hurt badly. He placed a call to the nurse-line of which he states helped him emotionally by discussing his situation with the nurse and he was able to calm down once he felt as though he was not having an acute situation. Patient did make an upcoming appointment with the NP within his PCP's group for 10/31/19. He also did go to PT this morning and states at this time he his pain level is better. He plans to use the pain and antianxiety medications he does have as well as ice and heat to help with his pain until he sees the NP this coming Tuesday. Patient states that this nurse did answer all of his questions and concerns and he was very appreciative for the follow-up callback.   Plan: RN Health Coach will call patient within the month of October.  Alan Loron RN, BSN Alan Schmidt [phone] [email or web address]

## 2019-10-31 ENCOUNTER — Encounter: Payer: Self-pay | Admitting: *Deleted

## 2019-10-31 ENCOUNTER — Other Ambulatory Visit: Payer: Self-pay | Admitting: Family Medicine

## 2019-10-31 ENCOUNTER — Ambulatory Visit: Payer: Medicare Other | Admitting: Nurse Practitioner

## 2019-10-31 ENCOUNTER — Other Ambulatory Visit: Payer: Self-pay | Admitting: *Deleted

## 2019-10-31 DIAGNOSIS — H524 Presbyopia: Secondary | ICD-10-CM | POA: Diagnosis not present

## 2019-10-31 DIAGNOSIS — H353131 Nonexudative age-related macular degeneration, bilateral, early dry stage: Secondary | ICD-10-CM | POA: Diagnosis not present

## 2019-10-31 DIAGNOSIS — E083551 Diabetes mellitus due to underlying condition with stable proliferative diabetic retinopathy, right eye: Secondary | ICD-10-CM | POA: Diagnosis not present

## 2019-10-31 DIAGNOSIS — H52223 Regular astigmatism, bilateral: Secondary | ICD-10-CM | POA: Diagnosis not present

## 2019-10-31 NOTE — Patient Outreach (Signed)
[facility]) Care Management  10/31/2019  KIRSTEN MCKONE 1945/09/25 [id number]   CSW made a second attempt to try and contact patient today to follow-up regarding social work services and resources, as well as to ensure that patient received all the resource information and applications provided to him by CSW.  However, patient was unavailable at the time of CSW's call.  CSW left a HIPAA compliant message on voicemail for patient and continues to await a return call.  CSW will make a third outreach attempt within the next 3-4 business days, if a return call is not received from patient in the meantime.  CSW will also mail a Patient Unsuccessful Outreach Letter to patient's home, encouraging patient to contact CSW directly if he is interested in continuing to receive social work services through [place] with [facility].    [name], BSW, MSW, LCSW  Licensed Education officer, environmental Health System  Mailing [place] [address] [place], [place] [address] Physical Address-[address], [place],  [address] Toll Free Main # [phone] Fax # [phone] Cell # [phone]  [name]

## 2019-11-01 ENCOUNTER — Ambulatory Visit: Payer: Self-pay | Admitting: *Deleted

## 2019-11-01 NOTE — Telephone Encounter (Signed)
Pt is following up on prescription request.

## 2019-11-02 DIAGNOSIS — M6248 Contracture of muscle, other site: Secondary | ICD-10-CM | POA: Diagnosis not present

## 2019-11-02 DIAGNOSIS — M25551 Pain in right hip: Secondary | ICD-10-CM | POA: Diagnosis not present

## 2019-11-06 ENCOUNTER — Telehealth: Payer: Self-pay

## 2019-11-06 ENCOUNTER — Encounter: Payer: Self-pay | Admitting: Pain Medicine

## 2019-11-06 ENCOUNTER — Other Ambulatory Visit: Payer: Self-pay | Admitting: *Deleted

## 2019-11-06 NOTE — Telephone Encounter (Signed)
Schedule a virtual visit with Dr Dossie Arbour

## 2019-11-06 NOTE — Telephone Encounter (Signed)
Attempted to call patient for pre virtual appointment questions.  LM to call our office.

## 2019-11-06 NOTE — Patient Outreach (Signed)
[name] Care Management  11/06/2019  [name] 09/06/1945 [id number]   CSW attempted to reach pt/family today per request of assigned CSW, [name].  CSW was unable to reach pt and was able to leave a HIPPA compliant voice message.  CSW will update assigned CSW upon her return tomorrow for further outreach.    Eduard Clos, MSW, [place] Worker  [place] [phone]

## 2019-11-06 NOTE — Progress Notes (Signed)
Patient: Alan Schmidt  Service Category: E/M  Provider: Gaspar Cola, MD  DOB: [birth year]  DOS: 11/07/2019  Location: Office  MRN: [id number]  Setting: Ambulatory outpatient  Referring Provider: [name], MD  Type: Established Patient  Specialty: Interventional Pain Management  PCP: [name], MD  Location: Remote location  Delivery: TeleHealth     Virtual Encounter - Pain Management PROVIDER NOTE: Information contained herein reflects review and annotations entered in association with encounter. Interpretation of such information and data should be left to medically-trained personnel. Information provided to patient can be located elsewhere in the medical record under "Patient Instructions". Document created using STT-dictation technology, any transcriptional errors that may result from process are unintentional.    Contact & Pharmacy Preferred: [phone] Home: [phone] (home) Mobile: [phone] (mobile) E-mail: [email or web address]  [id number] [place], [place] [address] [place] [address] Phone: [phone] Fax: [phone]  [id number] [place], [place] - [address] AT [facility] [address] [place] [address] Phone: [phone] Fax: [phone]   Pre-screening  Mr. Grandville Silos offered "in-person" vs "virtual" encounter. He indicated preferring virtual for this encounter.   Reason COVID-19*  Social distancing based on CDC and AMA recommendations.   I contacted Alan Schmidt on 11/07/2019 via telephone.      I clearly identified myself as Gaspar Cola, MD. I verified that I was speaking with the correct person using two identifiers (Name: GURSHAN SETTLEMIRE, and date of birth: [birth year]).  Consent I sought verbal advanced consent from Alan Schmidt for virtual visit interactions. I informed Mr. Rotan of possible security and privacy  concerns, risks, and limitations associated with providing "not-in-person" medical evaluation and management services. I also informed Mr. Depass of the availability of "in-person" appointments. Finally, I informed him that there would be a charge for the virtual visit and that he could be  personally, fully or partially, financially responsible for it. Mr. Loiseau expressed understanding and agreed to proceed.   Historic Elements   Mr. JIBRI SCHRIEFER is a 74 y.o. year old, male patient evaluated today after our last contact on 09/27/2019. Mr. Vandiver  has a past medical history of Acute postoperative pain (11/24/2016), Anxiety, Chronic hip pain (Right) (12/05/2014), Chronic lumbar pain, Depression, Diabetes mellitus without complication ([place]), Hyperlipidemia, Hypertension, Kidney stones, and Migraines. He also  has a past surgical history that includes Total hip arthroplasty; Tonsillectomy; and right hip surgery. Mr. Storey has a current medication list which includes the following prescription(s): acetaminophen, alprazolam, amlodipine, aspirin ec, bupropion, carvedilol, freestyle libre 14 day reader, freestyle libre 14 day sensor, desoximetasone, escitalopram, hydrochlorothiazide, ibuprofen, insulin lispro, bd pen needle nano u/f, jardiance, loratadine, [START ON 11/26/2019] lubiprostone, meloxicam, metformin, mometasone, [START ON 11/27/2019] morphine, [START ON 12/27/2019] morphine, [START ON 01/26/2020] morphine, naloxone, olmesartan, pantoprazole, pravastatin, sennosides-docusate sodium, tamsulosin, tresiba flextouch, victoza, [START ON 12/01/2019] benefiber, and gabapentin. He  reports that he has quit smoking. His smoking use included cigarettes. He has never used smokeless tobacco. He reports that he does not drink alcohol and does not use drugs. Mr. Ruan is allergic to contrast media [iodinated diagnostic agents], iodine, and shellfish allergy.   HPI  Today, he is being contacted for  medication management.  The patient indicates having gone through a period of stress due to the change in medicine from the oxycodone IR to the MS Contin.  However, he also had difficulties obtaining his  anxiety medication and this might have contributed to the fact that he went through a period of anxiety were he gave [place] a call thinking that I had to do with the MS Contin but in fact it was not related to it.  He has gone back to taking the MS Contin 30 mg twice daily and he is tolerating the medication without any type of problems.  At this point, he indicates that he is having recurrence of his pain in the right lower back.  He wants to go ahead and repeat the radiofrequency.  The last one was done around November 2020 and therefore is coming up on a year.  We will go ahead and plan on repeating that one.  In addition, he has been complaining of some sporadic shooting stabbing pains in his back.  I asked him if he had tried any pregabalin or gabapentin and he indicated that he thought he might of taken some many years ago, and does not recall having had any side effects to it.  Today we will go ahead and start him on a low-dose titration of the medication and see if that can help him with some of that neuropathic component to the pain.   The patient indicates doing well with the current medication regimen. No adverse reactions or side effects reported to the medications.   Pharmacotherapy Assessment  Analgesic: Morphine ER (MS Contin) 30 mg, 1 tab p.o. twice daily (60 mg/day).  (Previously on oxycodone IR 10 mg every 6 hours (40 mg/day)) MME/day:60 mg/day.   Monitoring: [place] PMP: PDMP reviewed during this encounter.       Pharmacotherapy: No side-effects or adverse reactions reported. Compliance: No problems identified. Effectiveness: Clinically acceptable. Plan: Refer to "POC".  UDS:  Summary  Date Value Ref Range Status  05/30/2019 Note  Final    Comment:     ==================================================================== ToxAS[employer] Select 13 (MW) ==================================================================== Test                             Result       Flag       Units Drug Present and Declared for Prescription Verification   Alprazolam                     99           EXPECTED   ng/mg creat   Alpha-hydroxyalprazolam        256          EXPECTED   ng/mg creat    Source of alprazolam is a scheduled prescription medication. Alpha-    hydroxyalprazolam is an expected metabolite of alprazolam.   Oxycodone                      1380         EXPECTED   ng/mg creat   Noroxycodone                   1789         EXPECTED   ng/mg creat    Sources of oxycodone include scheduled prescription medications.    Noroxycodone is an expected metabolite of oxycodone. Drug Present not Declared for Prescription Verification   Hydrocodone                    100          UNEXPECTED ng/mg creat  Dihydrocodeine                 69           UNEXPECTED ng/mg creat   Norhydrocodone                 117          UNEXPECTED ng/mg creat    Sources of hydrocodone include scheduled prescription medications.    Dihydrocodeine and norhydrocodone are expected metabolites of    hydrocodone. Dihydrocodeine is also available as a scheduled    prescription medication. ==================================================================== Test                      Result    Flag   Units      Ref Range   Creatinine              75               mg/dL      >=20 ==================================================================== Declared Medications:  The flagging and interpretation on this report are based on the  following declared medications.  Unexpected results may arise from  inaccuracies in the declared medications.  **Note: The testing scope of this panel includes these medications:  Alprazolam (Xanax)  Oxycodone  **Note: The testing scope of this panel does not  include the  following reported medications:  Acetaminophen (Tylenol)  Amlodipine (Norvasc)  Aspirin  Bupropion (Wellbutrin)  Carvedilol (Coreg)  Desoximetasone (Topicort)  Empagliflozin (Jardiance)  Escitalopram (Lexapro)  Hydrochlorothiazide  Insulin Tyler Aas)  Liraglutide  Loratadine (Claritin)  Lubiprostone (Amitiza)  Meloxicam (Mobic)  Metformin (Glucophage)  Mometasone (Nasonex)  Naloxone (Narcan)  Olmesartan (Benicar)  Pantoprazole (Protonix)  Pravastatin (Pravachol)  Sennosides (Senokot)  Tamsulosin (Flomax)  Topical ==================================================================== For clinical consultation, please call [phone]. ====================================================================     Laboratory Chemistry Profile   Renal Lab Results  Component Value Date   BUN 19 05/15/2019   CREATININE 1.08 05/15/2019   GFR 81.04 05/15/2019   GFRAA >60 03/26/2018   GFRNONAA >60 03/26/2018     Hepatic Lab Results  Component Value Date   AST 23 10/26/2018   ALT 25 10/26/2018   ALBUMIN 4.0 10/26/2018   ALKPHOS 109 10/26/2018     Electrolytes Lab Results  Component Value Date   NA 137 05/15/2019   K 4.3 05/15/2019   CL 100 05/15/2019   CALCIUM 9.3 05/15/2019   MG 1.8 03/06/2015     Bone No results found for: VD25OH, VD125OH2TOT, KY7062BJ6, EG3151VO1, 25OHVITD1, 25OHVITD2, 25OHVITD3, TESTOFREE, TESTOSTERONE   Inflammation (CRP: Acute Phase) (ESR: Chronic Phase) Lab Results  Component Value Date   CRP 0.5 03/06/2015   ESRSEDRATE 45 (H) 12/14/2016       Note: Above Lab results reviewed.  Imaging  DG PAIN CLINIC C-ARM 1-60 MIN NO REPORT Fluoro was used, but no Radiologist interpretation will be provided.  Please refer to "NOTES" tab for provider progress note.  Assessment  The primary encounter diagnosis was Chronic pain syndrome. Diagnoses of Chronic low back pain (Primary Source of Pain) (Bilateral) (R>L), Lumbar facet syndrome  (Bilateral) (R>L), Neurogenic pain, and Neuropathic pain were also pertinent to this visit.  Plan of Care  Problem-specific:  No problem-specific Assessment & Plan notes found for this encounter.  Mr. KIRTIS CHALLIS has a current medication list which includes the following long-term medication(s): amlodipine, bupropion, carvedilol, escitalopram, hydrochlorothiazide, insulin lispro, loratadine, [START ON 11/26/2019] lubiprostone, meloxicam, metformin, mometasone, [START ON 11/27/2019] morphine, [START  ON 12/27/2019] morphine, [START ON 01/26/2020] morphine, olmesartan, pantoprazole, pravastatin, victoza, [START ON 12/01/2019] benefiber, and gabapentin.  Pharmacotherapy (Medications Ordered): Meds ordered this encounter  Medications  . gabapentin (NEURONTIN) 100 MG capsule    Sig: Take 1 capsule (100 mg total) by mouth at bedtime. Follow written titration schedule.    Dispense:  30 capsule    Refill:  0    Fill one day early if pharmacy is closed on scheduled refill date. May substitute for generic if available.  . morphine (MS CONTIN) 30 MG 12 hr tablet    Sig: Take 1 tablet (30 mg total) by mouth every 12 (twelve) hours. Must last 30 days. Do not break tablet    Dispense:  60 tablet    Refill:  0    Chronic Pain: STOP Act (Not applicable) Fill 1 day early if closed on refill date. Avoid benzodiazepines within 8 hours of opioids  . morphine (MS CONTIN) 30 MG 12 hr tablet    Sig: Take 1 tablet (30 mg total) by mouth every 12 (twelve) hours. Must last 30 days. Do not break tablet    Dispense:  60 tablet    Refill:  0    Chronic Pain: STOP Act (Not applicable) Fill 1 day early if closed on refill date. Avoid benzodiazepines within 8 hours of opioids   Orders:  Orders Placed This Encounter  Procedures  . Radiofrequency,Lumbar    Standing Status:   Future    Standing Expiration Date:   11/06/2020    Scheduling Instructions:     Side(s): Right-sided     Level: L3-4, L4-5, & L5-S1 Facets  (L2, L3, L4, L5, & S1 Medial Branch Nerves)     Sedation: Patient's choice.     Scheduling Timeframe: As soon as pre-approved    Order Specific Question:   Where will this procedure be performed?    Answer:   [facility]   Follow-up plan:   Return for Radio-Frequency: (R) L-FCT RFA.      Interventional management options: Planned, scheduled, and/or pending:   Therapeutic caudal epidural steroid injection under fluoroscopic guidance and IV sedation   Considering:   Diagnostic Left GONB  Possible Left GON RFA    Palliative PRN treatment(s):   Palliative right lumbar facet block #3  Palliative left lumbar facet block #3  Therapeutic left cervical ESI #2  Palliative left lumbar facet RFA #3 (last done on 05/09/2019) Palliative right lumbar facet RFA #6 (last done on 12/27/2018)    Recent Visits Date Type Provider Dept  08/16/19 Office Visit Milinda Pointer, MD [facility]  Showing recent visits within past 90 days and meeting all other requirements Today's Visits Date Type Provider Dept  11/07/19 Telemedicine Milinda Pointer, MD [facility]  Showing today's visits and meeting all other requirements Future Appointments Date Type Provider Dept  12/25/19 Appointment Milinda Pointer, MD [facility]  Showing future appointments within next 90 days and meeting all other requirements  I discussed the assessment and treatment plan with the patient. The patient was provided an opportunity to ask questions and all were answered. The patient agreed with the plan and demonstrated an understanding of the instructions.  Patient advised to call back or seek an in-person evaluation if the symptoms or condition worsens.  Duration of encounter: 18 minutes.  Note by: Gaspar Cola, MD Date: 11/07/2019; Time: 12:39 PM

## 2019-11-06 NOTE — Telephone Encounter (Signed)
Virtual

## 2019-11-07 ENCOUNTER — Ambulatory Visit: Payer: Medicare Other | Attending: Pain Medicine | Admitting: Pain Medicine

## 2019-11-07 ENCOUNTER — Other Ambulatory Visit: Payer: Self-pay

## 2019-11-07 DIAGNOSIS — G894 Chronic pain syndrome: Secondary | ICD-10-CM | POA: Diagnosis not present

## 2019-11-07 DIAGNOSIS — M5442 Lumbago with sciatica, left side: Secondary | ICD-10-CM

## 2019-11-07 DIAGNOSIS — M47816 Spondylosis without myelopathy or radiculopathy, lumbar region: Secondary | ICD-10-CM | POA: Diagnosis not present

## 2019-11-07 DIAGNOSIS — M792 Neuralgia and neuritis, unspecified: Secondary | ICD-10-CM

## 2019-11-07 DIAGNOSIS — M5441 Lumbago with sciatica, right side: Secondary | ICD-10-CM | POA: Diagnosis not present

## 2019-11-07 DIAGNOSIS — G8929 Other chronic pain: Secondary | ICD-10-CM

## 2019-11-07 MED ORDER — MORPHINE SULFATE ER 30 MG PO TBCR: 30.0000 mg | EXTENDED_RELEASE_TABLET | Freq: Two times a day (BID) | ORAL | 0 refills | Status: DC

## 2019-11-07 MED ORDER — GABAPENTIN 100 MG PO CAPS: 100.0000 mg | ORAL_CAPSULE | Freq: Every day | ORAL | 0 refills | Status: DC

## 2019-11-07 NOTE — Patient Instructions (Signed)
____________________________________________________________________________________________  Preparing for Procedure with Sedation  Procedure appointments are limited to planned procedures: . No Prescription Refills. . No disability issues will be discussed. . No medication changes will be discussed.  Instructions: . Oral Intake: Do not eat or drink anything for at least 8 hours prior to your procedure. (Exception: Blood Pressure Medication. See below.) . Transportation: Unless otherwise stated by your physician, you may drive yourself after the procedure. . Blood Pressure Medicine: Do not forget to take your blood pressure medicine with a sip of water the morning of the procedure. If your Diastolic (lower reading)is above 100 mmHg, elective cases will be cancelled/rescheduled. . Blood thinners: These will need to be stopped for procedures. Notify our staff if you are taking any blood thinners. Depending on which one you take, there will be specific instructions on how and when to stop it. . Diabetics on insulin: Notify the staff so that you can be scheduled 1st case in the morning. If your diabetes requires high dose insulin, take only  of your normal insulin dose the morning of the procedure and notify the staff that you have done so. . Preventing infections: Shower with an antibacterial soap the morning of your procedure. . Build-up your immune system: Take 1000 mg of Vitamin C with every meal (3 times a day) the day prior to your procedure. . Antibiotics: Inform the staff if you have a condition or reason that requires you to take antibiotics before dental procedures. . Pregnancy: If you are pregnant, call and cancel the procedure. . Sickness: If you have a cold, fever, or any active infections, call and cancel the procedure. . Arrival: You must be in the facility at least 30 minutes prior to your scheduled procedure. . Children: Do not bring children with you. . Dress appropriately:  Bring dark clothing that you would not mind if they get stained. . Valuables: Do not bring any jewelry or valuables.  Reasons to call and reschedule or cancel your procedure: (Following these recommendations will minimize the risk of a serious complication.) . Surgeries: Avoid having procedures within 2 weeks of any surgery. (Avoid for 2 weeks before or after any surgery). . Flu Shots: Avoid having procedures within 2 weeks of a flu shots or . (Avoid for 2 weeks before or after immunizations). . Barium: Avoid having a procedure within 7-10 days after having had a radiological study involving the use of radiological contrast. (Myelograms, Barium swallow or enema study). . Heart attacks: Avoid any elective procedures or surgeries for the initial 6 months after a "Myocardial Infarction" (Heart Attack). . Blood thinners: It is imperative that you stop these medications before procedures. Let us know if you if you take any blood thinner.  . Infection: Avoid procedures during or within two weeks of an infection (including chest colds or gastrointestinal problems). Symptoms associated with infections include: Localized redness, fever, chills, night sweats or profuse sweating, burning sensation when voiding, cough, congestion, stuffiness, runny nose, sore throat, diarrhea, nausea, vomiting, cold or Flu symptoms, recent or current infections. It is specially important if the infection is over the area that we intend to treat. . Heart and lung problems: Symptoms that may suggest an active cardiopulmonary problem include: cough, chest pain, breathing difficulties or shortness of breath, dizziness, ankle swelling, uncontrolled high or unusually low blood pressure, and/or palpitations. If you are experiencing any of these symptoms, cancel your procedure and contact your primary care physician for an evaluation.  Remember:  Regular Business hours are:    Monday to Thursday 8:00 AM to 4:00 PM  Provider's  Schedule: Jaxden Blyden, MD:  Procedure days: Tuesday and Thursday 7:30 AM to 4:00 PM  Bilal Lateef, MD:  Procedure days: Monday and Wednesday 7:30 AM to 4:00 PM ____________________________________________________________________________________________    

## 2019-11-09 DIAGNOSIS — M6248 Contracture of muscle, other site: Secondary | ICD-10-CM | POA: Diagnosis not present

## 2019-11-09 DIAGNOSIS — M25551 Pain in right hip: Secondary | ICD-10-CM | POA: Diagnosis not present

## 2019-11-15 ENCOUNTER — Other Ambulatory Visit: Payer: Self-pay | Admitting: *Deleted

## 2019-11-15 NOTE — Patient Outreach (Signed)
[facility]) Care Management  11/15/2019  Alan Schmidt [birth year] [id number]  Unsuccessful outreach attempt made to patient. RN Health Coach left HIPAA compliant voicemail message along with her contact information.  Plan: RN Health Coach will call patient within the month of November.  [name] RN, BSN [place] [phone] [email or web address]

## 2019-11-16 DIAGNOSIS — M25551 Pain in right hip: Secondary | ICD-10-CM | POA: Diagnosis not present

## 2019-11-16 DIAGNOSIS — E118 Type 2 diabetes mellitus with unspecified complications: Secondary | ICD-10-CM | POA: Diagnosis not present

## 2019-11-16 DIAGNOSIS — M6248 Contracture of muscle, other site: Secondary | ICD-10-CM | POA: Diagnosis not present

## 2019-11-16 DIAGNOSIS — Z794 Long term (current) use of insulin: Secondary | ICD-10-CM | POA: Diagnosis not present

## 2019-11-17 ENCOUNTER — Other Ambulatory Visit: Payer: Self-pay | Admitting: Family Medicine

## 2019-11-19 ENCOUNTER — Other Ambulatory Visit: Payer: Self-pay | Admitting: Family Medicine

## 2019-11-20 NOTE — Telephone Encounter (Signed)
Refill sent in. He needs follow-up scheduled with me as well.

## 2019-11-21 ENCOUNTER — Other Ambulatory Visit: Payer: Self-pay | Admitting: Family Medicine

## 2019-11-21 DIAGNOSIS — F419 Anxiety disorder, unspecified: Secondary | ICD-10-CM

## 2019-11-23 ENCOUNTER — Emergency Department
Admission: EM | Admit: 2019-11-23 | Discharge: 2019-11-23 | Disposition: A | Discharge status: Home / Self Care | Payer: Medicare Other | Attending: Emergency Medicine | Admitting: Emergency Medicine

## 2019-11-23 ENCOUNTER — Emergency Department: Payer: Medicare Other

## 2019-11-23 ENCOUNTER — Telehealth: Payer: Self-pay | Admitting: Family Medicine

## 2019-11-23 ENCOUNTER — Other Ambulatory Visit: Payer: Self-pay

## 2019-11-23 ENCOUNTER — Telehealth: Payer: Self-pay

## 2019-11-23 DIAGNOSIS — M25551 Pain in right hip: Secondary | ICD-10-CM | POA: Diagnosis not present

## 2019-11-23 DIAGNOSIS — M545 Low back pain, unspecified: Secondary | ICD-10-CM | POA: Diagnosis not present

## 2019-11-23 DIAGNOSIS — Z7982 Long term (current) use of aspirin: Secondary | ICD-10-CM | POA: Insufficient documentation

## 2019-11-23 DIAGNOSIS — Z87891 Personal history of nicotine dependence: Secondary | ICD-10-CM | POA: Diagnosis not present

## 2019-11-23 DIAGNOSIS — M6248 Contracture of muscle, other site: Secondary | ICD-10-CM | POA: Diagnosis not present

## 2019-11-23 DIAGNOSIS — I129 Hypertensive chronic kidney disease with stage 1 through stage 4 chronic kidney disease, or unspecified chronic kidney disease: Secondary | ICD-10-CM | POA: Diagnosis not present

## 2019-11-23 DIAGNOSIS — Z7984 Long term (current) use of oral hypoglycemic drugs: Secondary | ICD-10-CM | POA: Insufficient documentation

## 2019-11-23 DIAGNOSIS — M542 Cervicalgia: Secondary | ICD-10-CM | POA: Insufficient documentation

## 2019-11-23 DIAGNOSIS — Z041 Encounter for examination and observation following transport accident: Secondary | ICD-10-CM | POA: Diagnosis not present

## 2019-11-23 DIAGNOSIS — E119 Type 2 diabetes mellitus without complications: Secondary | ICD-10-CM | POA: Insufficient documentation

## 2019-11-23 DIAGNOSIS — Z794 Long term (current) use of insulin: Secondary | ICD-10-CM | POA: Diagnosis not present

## 2019-11-23 DIAGNOSIS — Z79899 Other long term (current) drug therapy: Secondary | ICD-10-CM | POA: Insufficient documentation

## 2019-11-23 DIAGNOSIS — N189 Chronic kidney disease, unspecified: Secondary | ICD-10-CM | POA: Diagnosis not present

## 2019-11-23 MED ORDER — HYDROCODONE-ACETAMINOPHEN 5-325 MG PO TABS
1.0000 | ORAL_TABLET | Freq: Once | ORAL | Status: AC
  Administered 2019-11-23: 1 via ORAL
  Filled 2019-11-23: qty 1
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
## 2019-11-23 NOTE — ED Notes (Signed)
Patient transported to X-ray 
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
## 2019-11-24 ENCOUNTER — Other Ambulatory Visit: Payer: Self-pay | Admitting: Family Medicine

## 2019-11-28 ENCOUNTER — Ambulatory Visit: Payer: Medicare Other | Admitting: Pain Medicine

## 2019-12-04 ENCOUNTER — Other Ambulatory Visit: Payer: Self-pay | Admitting: *Deleted

## 2019-12-04 ENCOUNTER — Ambulatory Visit (INDEPENDENT_AMBULATORY_CARE_PROVIDER_SITE_OTHER): Payer: Medicare Other | Admitting: Family Medicine

## 2019-12-04 ENCOUNTER — Encounter: Payer: Self-pay | Admitting: Family Medicine

## 2019-12-04 ENCOUNTER — Encounter: Payer: Medicare Other | Admitting: Pain Medicine

## 2019-12-04 ENCOUNTER — Encounter: Payer: Self-pay | Admitting: *Deleted

## 2019-12-04 ENCOUNTER — Other Ambulatory Visit: Payer: Self-pay

## 2019-12-04 DIAGNOSIS — K5903 Drug induced constipation: Secondary | ICD-10-CM

## 2019-12-04 DIAGNOSIS — F32A Depression, unspecified: Secondary | ICD-10-CM

## 2019-12-04 DIAGNOSIS — G894 Chronic pain syndrome: Secondary | ICD-10-CM | POA: Diagnosis not present

## 2019-12-04 DIAGNOSIS — T402X5A Adverse effect of other opioids, initial encounter: Secondary | ICD-10-CM | POA: Diagnosis not present

## 2019-12-04 DIAGNOSIS — F419 Anxiety disorder, unspecified: Secondary | ICD-10-CM | POA: Diagnosis not present

## 2019-12-04 MED ORDER — ALPRAZOLAM 1 MG PO TABS: ORAL_TABLET | ORAL | 0 refills | Status: DC

## 2019-12-04 NOTE — Patient Outreach (Signed)
[facility]) Care Management  12/04/2019  YAVUZ KIRBY 1945-10-26 [id number]    CSW made a fourth and final attempt to try and contact patient today to follow-up regarding social work services and resources, as well as to assist patient with referrals to various community agencies and resources; however, patient was always unavailable at the time of CSW's calls.  CSW has left HIPAA compliant messages on voicemail for patient, while awaiting a return call.  CSW will proceed with case closure, as required number of phone attempts were made and an Unsuccessful Patient Outreach Letter mailed to patient's home, allowing a total of 4 weeks for him to respond if he was interested in continuing to receive social work services and resources through [place] with Scientist, clinical (histocompatibility and immunogenetics).  CSW will notify patient's Primary Care Physician, [name] of CSW's plans to close patient's case, as well as send a Physician Case Closure Letter.  CSW will also mail a Case Closure Letter to patient's home.  Last, CSW will route this note to [name], RNCM with the Chronic Special Needs Plan, and individual placing the referral.  [name], BSW, MSW, LCSW  Licensed Clinical Social Worker  [place]  Mailing [place]. [address] [place], [place] [address] Physical Address-[name] [place], [place] [address] Toll Free Main # [phone] Fax # [phone] Cell # [phone]  [name]

## 2019-12-04 NOTE — Progress Notes (Signed)
Virtual Visit via telephone Note  This visit type was conducted due to national recommendations for restrictions regarding the COVID-19 pandemic (e.g. social distancing).  This format is felt to be most appropriate for this patient at this time.  All issues noted in this document were discussed and addressed.  No physical exam was performed (except for noted visual exam findings with Video Visits).   I connected with Alan Schmidt today at  3:15 PM EDT by telephone and verified that I am speaking with the correct person using two identifiers. Location patient: home Location provider: work Persons participating in the virtual visit: patient, provider  I discussed the limitations, risks, security and privacy concerns of performing an evaluation and management service by telephone and the availability of in person appointments. I also discussed with the patient that there may be a patient responsible charge related to this service. The patient expressed understanding and agreed to proceed.  Interactive audio and video telecommunications were attempted between this provider and patient, however failed, due to patient having technical difficulties OR patient did not have access to video capability.  We continued and completed visit with audio only.   Reason for visit: follow-up  HPI: Anxiety: Patient notes this is better now.  He had trouble getting his Xanax and his oxycodone was switched to MS Contin previously and was also out of his Lexapro.  This increased his anxiety.  He is also on Wellbutrin.  No depression.  Chronic constipation: Patient notes he has intermittent issues with constipation.  Last bowel movement was 7 days ago.  He is on chronic narcotics.  He has been taking senna and a stool softener.  Also has taken MiraLAX over the past couple of days.  No abdominal pain.   ROS: See pertinent positives and negatives per HPI.  Past Medical History:  Diagnosis Date  . Acute  postoperative pain 11/24/2016  . Anxiety   . Chronic hip pain (Right) 12/05/2014  . Chronic lumbar pain   . Depression   . Diabetes mellitus without complication ([place])   . Hyperlipidemia   . Hypertension   . Kidney stones   . Migraines     Past Surgical History:  Procedure Laterality Date  . right hip surgery     4 surgeries  . TONSILLECTOMY    . TOTAL HIP ARTHROPLASTY      Family History  Problem Relation Age of Onset  . Cancer Mother   . Heart disease Father   . Stroke Father   . Diabetes Father   . Diabetes Sister   . Diabetes Sister     SOCIAL HX: Former smoker   Current Outpatient Medications:  .  acetaminophen (TYLENOL) 500 MG tablet, Take 500 mg by mouth every 6 (six) hours as needed., Disp: , Rfl:  .  ALPRAZolam (XANAX) 1 MG tablet, TAKE 0.5 TABLETS BY MOUTH 2 TIMES DAILY AS NEEDED FOR ANXIETY., Disp: 30 tablet, Rfl: 0 .  amLODipine (NORVASC) 5 MG tablet, TAKE 1 TABLET BY MOUTH EVERY DAY, Disp: 30 tablet, Rfl: 11 .  aspirin EC 81 MG tablet, Take 81 mg by mouth daily., Disp: , Rfl:  .  buPROPion (WELLBUTRIN XL) 150 MG 24 hr tablet, TAKE 1 TABLET (150 MG TOTAL) BY MOUTH DAILY FOR 7 DAYS, THEN 2 TABLETS (300 MG TOTAL) DAILY., Disp: 180 tablet, Rfl: 1 .  carvedilol (COREG) 25 MG tablet, TAKE 1 TABLET BY MOUTH TWICE A DAY, Disp: 60 tablet, Rfl: 5 .  Continuous Blood Gluc  Receiver (FREESTYLE LIBRE 14 DAY READER) DEVI, 1 Device by Does not apply route daily. Use to scan to check blood sugar up to 7 times daily; E11.42,, Disp: 1 Device, Rfl: 1 .  Continuous Blood Gluc Sensor (FREESTYLE LIBRE 14 DAY SENSOR) MISC, 1 Device by Does not apply route every 14 (fourteen) days. E11.42, Disp: 6 each, Rfl: 3 .  desoximetasone (TOPICORT) 0.25 % cream, APPLY CREAM TO AFFECTED AREA TWO TIMES DAILY, FOR UP TO 7 DAYS, DO NOT APPLY TO FACE, Disp: 30 g, Rfl: 0 .  escitalopram (LEXAPRO) 20 MG tablet, TAKE 1 TABLET BY MOUTH EVERY DAY, Disp: 90 tablet, Rfl: 1 .  gabapentin (NEURONTIN) 100 MG  capsule, Take 1 capsule (100 mg total) by mouth at bedtime. Follow written titration schedule., Disp: 30 capsule, Rfl: 0 .  hydrochlorothiazide (HYDRODIURIL) 25 MG tablet, TAKE 1 TABLET BY MOUTH EVERY DAY, Disp: 30 tablet, Rfl: 5 .  ibuprofen (ADVIL) 800 MG tablet, TAKE 1 TABLET (800 MG TOTAL) BY MOUTH EVERY 8 (EIGHT) HOURS AS NEEDED FOR MODERATE PAIN., Disp: 20 tablet, Rfl: 0 .  insulin lispro (HUMALOG KWIKPEN) 100 UNIT/ML KwikPen, INJECT 7-10 units INTO THE SKIN three times daily with a meal, Disp: 15 mL, Rfl: 2 .  Insulin Pen Needle (BD PEN NEEDLE NANO U/F) 32G X 4 MM MISC, USE EVERY DAY, Disp: 100 each, Rfl: 4 .  JARDIANCE 25 MG TABS tablet, TAKE 1 TABLET BY MOUTH EVERY DAY, Disp: 30 tablet, Rfl: 3 .  loratadine (CLARITIN) 10 MG tablet, TAKE 1 TABLET BY MOUTH EVERY DAY, Disp: 30 tablet, Rfl: 11 .  lubiprostone (AMITIZA) 8 MCG capsule, Take 1 capsule (8 mcg total) by mouth 2 (two) times daily with a meal. Swallow the medication whole. Do not break or chew the medication., Disp: 60 capsule, Rfl: 2 .  meloxicam (MOBIC) 15 MG tablet, Take 1 tablet (15 mg total) by mouth daily., Disp: 90 tablet, Rfl: 1 .  metFORMIN (GLUCOPHAGE) 1000 MG tablet, TAKE 1 TABLET BY MOUTH TWICE A DAY WITH MEALS, Disp: 60 tablet, Rfl: 5 .  mometasone (NASONEX) 50 MCG/ACT nasal spray, INSTILL TWO SPRAYS INTO THE NOSE DAILY, Disp: 1 each, Rfl: 3 .  morphine (MS CONTIN) 30 MG 12 hr tablet, Take 1 tablet (30 mg total) by mouth every 12 (twelve) hours. Must last 30 days. Do not break tablet, Disp: 60 tablet, Rfl: 0 .  [START ON 12/27/2019] morphine (MS CONTIN) 30 MG 12 hr tablet, Take 1 tablet (30 mg total) by mouth every 12 (twelve) hours. Must last 30 days. Do not break tablet, Disp: 60 tablet, Rfl: 0 .  [START ON 01/26/2020] morphine (MS CONTIN) 30 MG 12 hr tablet, Take 1 tablet (30 mg total) by mouth every 12 (twelve) hours. Must last 30 days. Do not break tablet, Disp: 60 tablet, Rfl: 0 .  naloxone (NARCAN) 2 MG/2ML injection,  Inject 1 mL (1 mg total) into the muscle as needed for up to 2 doses (for emergency use only). Always have available. Inject into thigh muscle and call 911, Disp: 2 mL, Rfl: 1 .  olmesartan (BENICAR) 40 MG tablet, TAKE 1 TABLET BY MOUTH EVERY DAY, Disp: 30 tablet, Rfl: 5 .  pantoprazole (PROTONIX) 40 MG tablet, TAKE 1 TABLET BY MOUTH EVERY DAY, Disp: 30 tablet, Rfl: 5 .  pravastatin (PRAVACHOL) 40 MG tablet, TAKE 1 TABLET BY MOUTH EVERY DAY, Disp: 30 tablet, Rfl: 5 .  sennosides-docusate sodium (SENOKOT-S) 8.6-50 MG tablet, Take 1 tablet by mouth daily as  needed. , Disp: , Rfl:  .  tamsulosin (FLOMAX) 0.4 MG CAPS capsule, TAKE 1 CAPSULE (0.4 MG TOTAL) BY MOUTH 2 (TWO) TIMES DAILY., Disp: 60 capsule, Rfl: 8 .  TRESIBA FLEXTOUCH 100 UNIT/ML FlexTouch Pen, INJECT 40 UNITS INTO THE SKIN DAILY AT 10 PM., Disp: 15 pen, Rfl: 3 .  VICTOZA 18 MG/3ML SOPN, INJECT 1.8 MG UNDER THE SKIN ONCE DAILY, Disp: 5 pen, Rfl: 5 .  Wheat Dextrin (BENEFIBER) POWD, Take 6 g by mouth 3 (three) times daily before meals. (2 tsp = 6 g), Disp: 730 g, Rfl: 2  EXAM: This was a telephone visit and thus no physical exam was completed.  ASSESSMENT AND PLAN:  Discussed the following assessment and plan:  Problem List Items Addressed This Visit    Anxiety and depression    Anxiety has improved now that he has had his medications.  He will continue Lexapro 20 mg once daily.  He can continue alprazolam 0.5 mg 2 times daily as needed.  He will also remain on Wellbutrin 300 mg once daily.      Relevant Medications   ALPRAZolam (XANAX) 1 MG tablet   Chronic pain syndrome (Chronic)    Patient notes the meloxicam provides no benefit for him.  I will check with our clinical pharmacist to see if she feels Celebrex would be an okay option for him with his medical issues.      Opioid-induced constipation (OIC) (Chronic)    Chronic issue.  He will see how he does with MiraLAX over the next few days.  If not improving he will let us  know.  Discussed he could take MiraLAX daily moving forward if needed.          I discussed the assessment and treatment plan with the patient. The patient was provided an opportunity to ask questions and all were answered. The patient agreed with the plan and demonstrated an understanding of the instructions.   The patient was advised to call back or seek an in-person evaluation if the symptoms worsen or if the condition fails to improve as anticipated.  I provided 17 minutes of non-face-to-face time during this encounter.   [name], MD

## 2019-12-07 ENCOUNTER — Telehealth: Payer: Medicare Other

## 2019-12-07 ENCOUNTER — Telehealth: Payer: Self-pay | Admitting: Pharmacist

## 2019-12-07 DIAGNOSIS — M6248 Contracture of muscle, other site: Secondary | ICD-10-CM | POA: Diagnosis not present

## 2019-12-07 DIAGNOSIS — M25551 Pain in right hip: Secondary | ICD-10-CM | POA: Diagnosis not present

## 2019-12-07 NOTE — Progress Notes (Signed)
  Chronic Care Management   Note  12/07/2019 Name: Alan Schmidt MRN: [id number] DOB: 1945-04-24   Attempted to contact patient for scheduled appointment for medication management support. Left HIPAA compliant message for patient to return my call at their convenience.    Plan: - If I do not hear back from the patient by end of business today, will collaborate with Care Guide to outreach to schedule follow up with me   [name], PharmD, [place], [place] Pharmacist [place] [phone]

## 2019-12-08 NOTE — Assessment & Plan Note (Signed)
Anxiety has improved now that he has had his medications.  He will continue Lexapro 20 mg once daily.  He can continue alprazolam 0.5 mg 2 times daily as needed.  He will also remain on Wellbutrin 300 mg once daily.

## 2019-12-08 NOTE — Assessment & Plan Note (Signed)
Chronic issue.  He will see how he does with MiraLAX over the next few days.  If not improving he will let us know.  Discussed he could take MiraLAX daily moving forward if needed.

## 2019-12-08 NOTE — Assessment & Plan Note (Signed)
Patient notes the meloxicam provides no benefit for him.  I will check with our clinical pharmacist to see if she feels Celebrex would be an okay option for him with his medical issues.

## 2019-12-11 ENCOUNTER — Other Ambulatory Visit: Payer: Self-pay | Admitting: *Deleted

## 2019-12-11 NOTE — Patient Outreach (Signed)
[facility]) Care Management  12/11/2019  KATSUMI WISLER 1945/11/10 [id number]  Unsuccessful outreach attempt made to patient. RN Health Coach left HIPAA compliant voicemail message along with her contact information.  Plan: RN Health Coach will call patient within the month of December  [name] RN, [place] [phone] [email or web address]

## 2019-12-13 NOTE — Progress Notes (Signed)
PROVIDER NOTE: Information contained herein reflects review and annotations entered in association with encounter. Interpretation of such information and data should be left to medically-trained personnel. Information provided to patient can be located elsewhere in the medical record under "Patient Instructions". Document created using STT-dictation technology, any transcriptional errors that may result from process are unintentional.    Patient: Alan Schmidt  Service Category: Procedure  Provider: Gaspar Cola, MD  DOB: 1945-10-26  DOS: 12/14/2019  Location: [facility]  MRN: [id number]  Setting: Ambulatory - outpatient  Referring Provider: Milinda Pointer, MD  Type: Established Patient  Specialty: Interventional Pain Management  PCP: [name], MD   Primary Reason for Visit: Interventional Pain Management Treatment. CC: Back Pain  Procedure:          Anesthesia, Analgesia, Anxiolysis:  Type: Thermal Lumbar Facet, Medial Branch Radiofrequency Ablation/Neurotomy  #6  Primary Purpose: Therapeutic Region: Posterolateral Lumbosacral Spine Level: L2, L3, L4, L5, & S1 Medial Branch Level(s). These levels will denervate the L3-4, L4-5, and the L5-S1 lumbar facet joints. Laterality: Right  Type: Moderate (Conscious) Sedation combined with Local Anesthesia Indication(s): Analgesia and Anxiety Route: Intravenous (IV) IV Access: Secured Sedation: Meaningful verbal contact was maintained at all times during the procedure  Local Anesthetic: Lidocaine 1-2%  Position: Prone   Indications: 1. Lumbar facet syndrome (Bilateral) (R>L)   2. Spondylosis without myelopathy or radiculopathy, lumbosacral region   3. Lumbar facet joint osteoarthritis (Bilateral)   4. DDD (degenerative disc disease), lumbar   5. Chronic low back pain (Bilateral) w/o sciatica    History of allergy to radiographic contrast media    History of allergy to shellfish    History of Vasovagal  response to spinal injections    History of allergy to povidone-iodine topical antiseptic    Mr. Farrel has been dealing with the above chronic pain for longer than three months and has either failed to respond, was unable to tolerate, or simply did not get enough benefit from other more conservative therapies including, but not limited to: 1. Over-the-counter medications 2. Anti-inflammatory medications 3. Muscle relaxants 4. Membrane stabilizers 5. Opioids 6. Physical therapy and/or chiropractic manipulation 7. Modalities (Heat, ice, etc.) 8. Invasive techniques such as nerve blocks. Mr. Kepner has attained more than 50% relief of the pain from a series of diagnostic injections conducted in separate occasions.  Pain Score: Pre-procedure: 4 /10 Post-procedure: 0-No pain/10  RTCB: 03/12/2020 to evaluate gabapentin titration. Nonopioids transferred 12/14/2019: Benefiber, Amitiza, and Mobic.  Pre-op Assessment:  Mr. Tomich is a 74 y.o. (year old), male patient, seen today for interventional treatment. He  has a past surgical history that includes Total hip arthroplasty; Tonsillectomy; and right hip surgery. Mr. Cardin has a current medication list which includes the following prescription(s): acetaminophen, alprazolam, amlodipine, aspirin ec, bupropion, carvedilol, freestyle libre 14 day reader, freestyle libre 14 day sensor, desoximetasone, escitalopram, hydrochlorothiazide, ibuprofen, insulin lispro, bd pen needle nano u/f, jardiance, loratadine, metformin, mometasone, [START ON 12/27/2019] morphine, [START ON 01/26/2020] morphine, naloxone, olmesartan, pantoprazole, pravastatin, sennosides-docusate sodium, tamsulosin, tresiba flextouch, victoza, gabapentin, [START ON 01/27/2020] gabapentin, hydrocodone-acetaminophen, [START ON 12/21/2019] hydrocodone-acetaminophen, lubiprostone, meloxicam, [START ON 02/25/2020] morphine, and benefiber, and the following Facility-Administered Medications:  fentanyl and midazolam. His primarily concern today is the Back Pain  Initial Vital Signs:  Pulse/HCG Rate: 76ECG Heart Rate: 87 Temp: (!) 97.2 F (36.2 C) Resp: 18 BP: 124/86 SpO2: 98 %  BMI: Estimated body mass index is 34.17 kg/m as calculated from the following:  Height as of this encounter: 6\' 1"  (1.854 m).   Weight as of this encounter: 259 lb (117.5 kg).  Risk Assessment: Allergies: Reviewed. He is allergic to contrast media [iodinated diagnostic agents], iodine, and shellfish allergy.  Allergy Precautions: None required Coagulopathies: Reviewed. None identified.  Blood-thinner therapy: None at this time Active Infection(s): Reviewed. None identified. Mr. Gernert is afebrile  Site Confirmation: Mr. Neuharth was asked to confirm the procedure and laterality before marking the site Procedure checklist: Completed Consent: Before the procedure and under the influence of no sedative(s), amnesic(s), or anxiolytics, the patient was informed of the treatment options, risks and possible complications. To fulfill our ethical and legal obligations, as recommended by the American Medical Association's Code of Ethics, I have informed the patient of my clinical impression; the nature and purpose of the treatment or procedure; the risks, benefits, and possible complications of the intervention; the alternatives, including doing nothing; the risk(s) and benefit(s) of the alternative treatment(s) or procedure(s); and the risk(s) and benefit(s) of doing nothing. The patient was provided information about the general risks and possible complications associated with the procedure. These may include, but are not limited to: failure to achieve desired goals, infection, bleeding, organ or nerve damage, allergic reactions, paralysis, and death. In addition, the patient was informed of those risks and complications associated to Spine-related procedures, such as failure to decrease pain; infection (i.e.:  Meningitis, epidural or intraspinal abscess); bleeding (i.e.: epidural hematoma, subarachnoid hemorrhage, or any other type of intraspinal or peri-dural bleeding); organ or nerve damage (i.e.: Any type of peripheral nerve, nerve root, or spinal cord injury) with subsequent damage to sensory, motor, and/or autonomic systems, resulting in permanent pain, numbness, and/or weakness of one or several areas of the body; allergic reactions; (i.e.: anaphylactic reaction); and/or death. Furthermore, the patient was informed of those risks and complications associated with the medications. These include, but are not limited to: allergic reactions (i.e.: anaphylactic or anaphylactoid reaction(s)); adrenal axis suppression; blood sugar elevation that in diabetics may result in ketoacidosis or comma; water retention that in patients with history of congestive heart failure may result in shortness of breath, pulmonary edema, and decompensation with resultant heart failure; weight gain; swelling or edema; medication-induced neural toxicity; particulate matter embolism and blood vessel occlusion with resultant organ, and/or nervous system infarction; and/or aseptic necrosis of one or more joints. Finally, the patient was informed that Medicine is not an exact science; therefore, there is also the possibility of unforeseen or unpredictable risks and/or possible complications that may result in a catastrophic outcome. The patient indicated having understood very clearly. We have given the patient no guarantees and we have made no promises. Enough time was given to the patient to ask questions, all of which were answered to the patient's satisfaction. Mr. Umholtz has indicated that he wanted to continue with the procedure. Attestation: I, the ordering provider, attest that I have discussed with the patient the benefits, risks, side-effects, alternatives, likelihood of achieving goals, and potential problems during recovery for the  procedure that I have provided informed consent. Date  Time: 12/14/2019 10:04 AM  Pre-Procedure Preparation:  Monitoring: As per clinic protocol. Respiration, ETCO2, SpO2, BP, heart rate and rhythm monitor placed and checked for adequate function Safety Precautions: Patient was assessed for positional comfort and pressure points before starting the procedure. Time-out: I initiated and conducted the "Time-out" before starting the procedure, as per protocol. The patient was asked to participate by confirming the accuracy of the "Time Out" information.  Verification of the correct person, site, and procedure were performed and confirmed by me, the nursing staff, and the patient. "Time-out" conducted as per Joint Commission's Universal Protocol (UP.01.01.01). Time: 1107  Description of Procedure:          Laterality: Right Levels:  L2, L3, L4, L5, & S1 Medial Branch Level(s), at the L3-4, L4-5, and the L5-S1 lumbar facet joints. Area Prepped: Lumbosacral DuraPrep (Iodine Povacrylex [0.7% available iodine] and Isopropyl Alcohol, 74% w/w) Safety Precautions: Aspiration looking for blood return was conducted prior to all injections. At no point did we inject any substances, as a needle was being advanced. Before injecting, the patient was told to immediately notify me if he was experiencing any new onset of "ringing in the ears, or metallic taste in the mouth". No attempts were made at seeking any paresthesias. Safe injection practices and needle disposal techniques used. Medications properly checked for expiration dates. SDV (single dose vial) medications used. After the completion of the procedure, all disposable equipment used was discarded in the proper designated medical waste containers. Local Anesthesia: Protocol guidelines were followed. The patient was positioned over the fluoroscopy table. The area was prepped in the usual manner. The time-out was completed. The target area was identified using  fluoroscopy. A 12-in long, straight, sterile hemostat was used with fluoroscopic guidance to locate the targets for each level blocked. Once located, the skin was marked with an approved surgical skin marker. Once all sites were marked, the skin (epidermis, dermis, and hypodermis), as well as deeper tissues (fat, connective tissue and muscle) were infiltrated with a small amount of a short-acting local anesthetic, loaded on a 10cc syringe with a 25G, 1.5-in  Needle. An appropriate amount of time was allowed for local anesthetics to take effect before proceeding to the next step. Local Anesthetic: Lidocaine 2.0% The unused portion of the local anesthetic was discarded in the proper designated containers. Technical explanation of process:  Radiofrequency Ablation (RFA) L2 Medial Branch Nerve RFA: The target area for the L2 medial branch is at the junction of the postero-lateral aspect of the superior articular process and the superior, posterior, and medial edge of the transverse process of L3. Under fluoroscopic guidance, a Radiofrequency needle was inserted until contact was made with os over the superior postero-lateral aspect of the pedicular shadow (target area). Sensory and motor testing was conducted to properly adjust the position of the needle. Once satisfactory placement of the needle was achieved, the numbing solution was slowly injected after negative aspiration for blood. 2.0 mL of the nerve block solution was injected without difficulty or complication. After waiting for at least 3 minutes, the ablation was performed. Once completed, the needle was removed intact. L3 Medial Branch Nerve RFA: The target area for the L3 medial branch is at the junction of the postero-lateral aspect of the superior articular process and the superior, posterior, and medial edge of the transverse process of L4. Under fluoroscopic guidance, a Radiofrequency needle was inserted until contact was made with os over the  superior postero-lateral aspect of the pedicular shadow (target area). Sensory and motor testing was conducted to properly adjust the position of the needle. Once satisfactory placement of the needle was achieved, the numbing solution was slowly injected after negative aspiration for blood. 2.0 mL of the nerve block solution was injected without difficulty or complication. After waiting for at least 3 minutes, the ablation was performed. Once completed, the needle was removed intact. L4 Medial Branch Nerve RFA: The target  area for the L4 medial branch is at the junction of the postero-lateral aspect of the superior articular process and the superior, posterior, and medial edge of the transverse process of L5. Under fluoroscopic guidance, a Radiofrequency needle was inserted until contact was made with os over the superior postero-lateral aspect of the pedicular shadow (target area). Sensory and motor testing was conducted to properly adjust the position of the needle. Once satisfactory placement of the needle was achieved, the numbing solution was slowly injected after negative aspiration for blood. 2.0 mL of the nerve block solution was injected without difficulty or complication. After waiting for at least 3 minutes, the ablation was performed. Once completed, the needle was removed intact. L5 Medial Branch Nerve RFA: The target area for the L5 medial branch is at the junction of the postero-lateral aspect of the superior articular process of S1 and the superior, posterior, and medial edge of the sacral ala. Under fluoroscopic guidance, a Radiofrequency needle was inserted until contact was made with os over the superior postero-lateral aspect of the pedicular shadow (target area). Sensory and motor testing was conducted to properly adjust the position of the needle. Once satisfactory placement of the needle was achieved, the numbing solution was slowly injected after negative aspiration for blood. 2.0 mL of the  nerve block solution was injected without difficulty or complication. After waiting for at least 3 minutes, the ablation was performed. Once completed, the needle was removed intact. S1 Medial Branch Nerve RFA: The target area for the S1 medial branch is located inferior to the junction of the S1 superior articular process and the L5 inferior articular process, posterior, inferior, and lateral to the 6 o'clock position of the L5-S1 facet joint, just superior to the S1 posterior foramen. Under fluoroscopic guidance, the Radiofrequency needle was advanced until contact was made with os over the Target area. Sensory and motor testing was conducted to properly adjust the position of the needle. Once satisfactory placement of the needle was achieved, the numbing solution was slowly injected after negative aspiration for blood. 2.0 mL of the nerve block solution was injected without difficulty or complication. After waiting for at least 3 minutes, the ablation was performed. Once completed, the needle was removed intact. Radiofrequency lesioning (ablation):  Radiofrequency Generator: NeuroTherm [id number] Sensory Stimulation Parameters: 50 Hz was used to locate & identify the nerve, making sure that the needle was positioned such that there was no sensory stimulation below 0.3 V or above 0.7 V. Motor Stimulation Parameters: 2 Hz was used to evaluate the motor component. Care was taken not to lesion any nerves that demonstrated motor stimulation of the lower extremities at an output of less than 2.5 times that of the sensory threshold, or a maximum of 2.0 V. Lesioning Technique Parameters: Standard Radiofrequency settings. (Not bipolar or pulsed.) Temperature Settings: 80 degrees C Lesioning time: 60 seconds Intra-operative Compliance: Despite the fact that this is the 6th time we do the radiofrequency on the side for this patient, he continues to have difficulty understanding the testing mechanism.  Unfortunately  this led to multiple attempts at trying to get the correct needle position for all of the needles.  The only that, but is constant moving would dislodge the needle from the correct position leading to further testing.  The total fluoroscopy time for this patient was over 3 minutes which is nearly 3 times what it takes me to get this procedure done.  Furthermore, despite the fact that he has been told to  bring his weight down, this has not happened and he has quite a bit of fatty tissue in his lower back which makes this technically difficult in his particular case.  I truly hope that all of this movement did not affect our final needle position, but I do not have high hopes that this will be very effective.  Not only that, but I have informed the patient that I will not be repeating this procedure any longer since this exposes myself and my staff to a significant amount of unnecessary radiation.  Because he is not exposed to this radiation on a regular basis, and not very concerned about him having any type of harm from it.  However, myself and my staff are exposed to this radiation on a regular basis and therefore this can a problem may end up being an unnecessary risk for my staff and I. Materials & Medications: Needle(s) (Electrode/Cannula) Type: Teflon-coated, curved tip, Radiofrequency needle(s) Gauge: 20G Length: 15cm Numbing solution: 0.2% PF-Ropivacaine + Triamcinolone (40 mg/mL) diluted to a final concentration of 4 mg of Triamcinolone/mL of Ropivacaine The unused portion of the solution was discarded in the proper designated containers.  Once the entire procedure was completed, the treated area was cleaned, making sure to leave some of the prepping solution back to take advantage of its long term bactericidal properties.  Illustration of the posterior view of the lumbar spine and the posterior neural structures. Laminae of L2 through S1 are labeled. DPRL5, dorsal primary ramus of L5; DPRS1, dorsal  primary ramus of S1; DPR3, dorsal primary ramus of L3; FJ, facet (zygapophyseal) joint L3-L4; I, inferior articular process of L4; LB1, lateral branch of dorsal primary ramus of L1; IAB, inferior articular branches from L3 medial branch (supplies L4-L5 facet joint); IBP, intermediate branch plexus; MB3, medial branch of dorsal primary ramus of L3; NR3, third lumbar nerve root; S, superior articular process of L5; SAB, superior articular branches from L4 (supplies L4-5 facet joint also); TP3, transverse process of L3.  Vitals:   12/14/19 1149 12/14/19 1159 12/14/19 1209 12/14/19 1219  BP: 122/76 121/65 (!) 144/84 127/84  Pulse:      Resp: 15 16 16 18   Temp:  97.6 F (36.4 C)  97.8 F (36.6 C)  SpO2: 100% 99% 98% 98%  Weight:      Height:       Start Time: 1107 hrs. End Time: 1149 hrs.  Imaging Guidance (Spinal):          Type of Imaging Technique: Fluoroscopy Guidance (Spinal) Indication(s): Assistance in needle guidance and placement for procedures requiring needle placement in or near specific anatomical locations not easily accessible without such assistance. Exposure Time: Please see nurses notes. Contrast: None used. Fluoroscopic Guidance: I was personally present during the use of fluoroscopy. "Tunnel Vision Technique" used to obtain the best possible view of the target area. Parallax error corrected before commencing the procedure. "Direction-depth-direction" technique used to introduce the needle under continuous pulsed fluoroscopy. Once target was reached, antero-posterior, oblique, and lateral fluoroscopic projection used confirm needle placement in all planes. Images permanently stored in EMR. Interpretation: No contrast injected. I personally interpreted the imaging intraoperatively. Adequate needle placement confirmed in multiple planes. Permanent images saved into the patient's record.  Antibiotic Prophylaxis:   Anti-infectives (From admission, onward)   None      Indication(s): None identified  Post-operative Assessment:  Post-procedure Vital Signs:  Pulse/HCG Rate: 7686 Temp: 97.8 F (36.6 C) Resp: 18 BP: 127/84 SpO2: 98 %  EBL: None  Complications: No immediate post-treatment complications observed by team, or reported by patient.  Note: The patient tolerated the entire procedure well. A repeat set of vitals were taken after the procedure and the patient was kept under observation following institutional policy, for this type of procedure. Post-procedural neurological assessment was performed, showing return to baseline, prior to discharge. The patient was provided with post-procedure discharge instructions, including a section on how to identify potential problems. Should any problems arise concerning this procedure, the patient was given instructions to immediately contact us, at any time, without hesitation. In any case, we plan to contact the patient by telephone for a follow-up status report regarding this interventional procedure.  Comments:  No additional relevant information.  Plan of Care  Orders:  Orders Placed This Encounter  Procedures  . Radiofrequency,Lumbar    Scheduling Instructions:     Side(s): Right-sided     Level: L3-4, L4-5, & L5-S1 Facets (L2, L3, L4, L5, & S1 Medial Branch Nerves)     Sedation: With Sedation.     Timeframe: Today    Order Specific Question:   Where will this procedure be performed?    Answer:   [facility]  . DG [facility] C-ARM 1-60 MIN NO REPORT    Intraoperative interpretation by procedural physician at [place].    Standing Status:   Standing    Number of Occurrences:   1    Order Specific Question:   Reason for exam:    Answer:   Assistance in needle guidance and placement for procedures requiring needle placement in or near specific anatomical locations not easily accessible without such assistance.  . Informed Consent Details: Physician/Practitioner Attestation;  Transcribe to consent form and obtain patient signature    Nursing Order: Transcribe to consent form and obtain patient signature. Note: Always confirm laterality of pain with Mr. Grandville Silos, before procedure.    Order Specific Question:   Physician/Practitioner attestation of informed consent for procedure/surgical case    Answer:   I, the physician/practitioner, attest that I have discussed with the patient the benefits, risks, side effects, alternatives, likelihood of achieving goals and potential problems during recovery for the procedure that I have provided informed consent.    Order Specific Question:   Procedure    Answer:   Lumbar Facet Radiofrequency Ablation    Order Specific Question:   Physician/Practitioner performing the procedure    Answer:   Gevon Markus A. Dossie Arbour, MD    Order Specific Question:   Indication/Reason    Answer:   Low Back Pain, with our without leg pain, due to Facet Joint Arthralgia (Joint Pain) known as Lumbar Facet Syndrome, secondary to Lumbar, and/or Lumbosacral Spondylosis (Arthritis of the Spine), without myelopathy or radiculopathy (Nerve Damage).  . Provide equipment / supplies at bedside    "Radiofrequency Tray"; Large hemostat (x1); Small hemostat (x1); Towels (x8); 4x4 sterile sponge pack (x1) Needle type: Teflon-coated Radiofrequency Needle (Disposable  single use) Size: Long Quantity: 5    Standing Status:   Standing    Number of Occurrences:   1    Order Specific Question:   Specify    Answer:   Radiofrequency Tray  . Miscellanous precautions    Standing Status:   Standing    Number of Occurrences:   1  . Miscellanous precautions    NOTE: Although It is true that patients can have allergies to shellfish and that shellfish contain iodine, most shellfish  allergies are due  to two protein allergens present in the shellfish: tropomyosins and parvalbumin. Not all patients with shellfish allergies are allergic to iodine. However, as a precaution, avoid  using iodine containing products.    Standing Status:   Standing    Number of Occurrences:   1   Chronic Opioid Analgesic:  Morphine ER (MS Contin) 30 mg, 1 tab p.o. twice daily (60 mg/day).  (Previously on oxycodone IR 10 mg every 6 hours (40 mg/day)) MME/day:60 mg/day.   Medications ordered for procedure: Meds ordered this encounter  Medications  . lidocaine (XYLOCAINE) 2 % (with pres) injection 400 mg  . lactated ringers infusion 1,000 mL  . midazolam (VERSED) 5 MG/5ML injection 1-2 mg    Make sure Flumazenil is available in the pyxis when using this medication. If oversedation occurs, administer 0.2 mg IV over 15 sec. If after 45 sec no response, administer 0.2 mg again over 1 min; may repeat at 1 min intervals; not to exceed 4 doses (1 mg)  . fentaNYL (SUBLIMAZE) injection 25-50 mcg    Make sure Narcan is available in the pyxis when using this medication. In the event of respiratory depression (RR< 8/min): Titrate NARCAN (naloxone) in increments of 0.1 to 0.2 mg IV at 2-3 minute intervals, until desired degree of reversal.  . glycopyrrolate (ROBINUL) injection 0.2 mg  . ropivacaine (PF) 2 mg/mL (0.2%) (NAROPIN) injection 9 mL  . triamcinolone acetonide (KENALOG-40) injection 40 mg  . HYDROcodone-acetaminophen (NORCO/VICODIN) 5-325 MG tablet    Sig: Take 1 tablet by mouth every 8 (eight) hours as needed for up to 7 days for severe pain. Must last 7 days.    Dispense:  21 tablet    Refill:  0    For acute post-operative pain. Not to be refilled. Must last 7 days.  Marland Kitchen HYDROcodone-acetaminophen (NORCO/VICODIN) 5-325 MG tablet    Sig: Take 1 tablet by mouth every 8 (eight) hours as needed for up to 7 days for severe pain. Must last for 7 days.    Dispense:  21 tablet    Refill:  0    For acute post-operative pain. Not to be refilled. Must last 7 days.  Marland Kitchen gabapentin (NEURONTIN) 100 MG capsule    Sig: Take 1 capsule (100 mg total) by mouth at bedtime for 15 days, THEN 2 capsules (200 mg  total) at bedtime for 15 days, THEN 3 capsules (300 mg total) at bedtime for 15 days. Follow written titration schedule.Marland Kitchen    Dispense:  90 capsule    Refill:  0    Fill one day early if pharmacy is closed on scheduled refill date. May substitute for generic, or similar, if available. Void any older refills or prescriptions of this medication.  . Wheat Dextrin (BENEFIBER) POWD    Sig: Take 6 g by mouth 3 (three) times daily before meals. (2 tsp = 6 g)    Dispense:  730 g    Refill:  2    Fill one day early if pharmacy is closed on scheduled refill date. May substitute for generic, or similar, if available. Void any older refills or prescriptions of this medication.  Marland Kitchen lubiprostone (AMITIZA) 8 MCG capsule    Sig: Take 1 capsule (8 mcg total) by mouth 2 (two) times daily with a meal. Swallow the medication whole. Do not break or chew the medication.    Dispense:  60 capsule    Refill:  2    Fill one day early if pharmacy is closed  on scheduled refill date. May substitute for generic, or similar, if available. Void any older refills or prescriptions of this medication.  . meloxicam (MOBIC) 15 MG tablet    Sig: Take 1 tablet (15 mg total) by mouth daily.    Dispense:  30 tablet    Refill:  2    Fill one day early if pharmacy is closed on scheduled refill date. May substitute for generic, or similar, if available. Void any older refills or prescriptions of this medication.  Marland Kitchen morphine (MS CONTIN) 30 MG 12 hr tablet    Sig: Take 1 tablet (30 mg total) by mouth every 12 (twelve) hours. Must last 30 days. Do not break tablet    Dispense:  60 tablet    Refill:  0    Chronic Pain: STOP Act (Not applicable) Fill 1 day early if closed on refill date. Avoid benzodiazepines within 8 hours of opioids  . gabapentin (NEURONTIN) 100 MG capsule    Sig: Take 3 capsules (300 mg total) by mouth at bedtime AND 1 capsule (100 mg total) 2 (two) times daily. Follow written titration schedule.Marland Kitchen    Dispense:  225  capsule    Refill:  0    Fill one day early if pharmacy is closed on scheduled refill date. May substitute for generic, or similar, if available.   Medications administered: We administered lidocaine, lactated ringers, midazolam, fentaNYL, glycopyrrolate, ropivacaine (PF) 2 mg/mL (0.2%), and triamcinolone acetonide.  See the medical record for exact dosing, route, and time of administration.  Follow-up plan:   Return in about 6 weeks (around 01/25/2020) for (VV), (PP) Follow-up.       Interventional management options: Planned, scheduled, and/or pending:   Therapeutic caudal epidural steroid injection under fluoroscopic guidance and IV sedation   Considering:   Diagnostic Left GONB  Possible Left GON RFA    Procedural information:   NOTE: NO MORE RFA procedures. Difficulty following intra-procedural instructions and commands, resulting in unnecessarily long exposure of staff to radiation. Therapeutic left lumbar facet RFA x2 (last done on 05/09/2019) Therapeutic right lumbar facet RFA x6 (last done on 12/14/2019)   Palliative PRN treatment(s):   Palliative right lumbar facet block #3  Palliative left lumbar facet block #3  Therapeutic left cervical ESI #2     Recent Visits Date Type Provider Dept  11/07/19 Telemedicine Milinda Pointer, MD [facility]  Showing recent visits within past 90 days and meeting all other requirements Today's Visits Date Type Provider Dept  12/14/19 Procedure visit Milinda Pointer, MD [facility]  Showing today's visits and meeting all other requirements Future Appointments Date Type Provider Dept  01/24/20 Appointment Milinda Pointer, [facility]  03/11/20 Appointment Milinda Pointer, MD [facility]  Showing future appointments within next 90 days and meeting all other requirements  Disposition: Discharge home  Discharge (Date  Time): 12/14/2019; 1220 hrs.   Primary Care Physician:  [name], MD Location: [facility] Note by: Gaspar Cola, MD Date: 12/14/2019; Time: 2:42 PM  Disclaimer:  Medicine is not an Chief Strategy Officer. The only guarantee in medicine is that nothing is guaranteed. It is important to note that the decision to proceed with this intervention was based on the information collected from the patient. The Data and conclusions were drawn from the patient's questionnaire, the interview, and the physical examination. Because the information was provided in large part by the patient, it cannot be guaranteed that it has not  been purposely or unconsciously manipulated. Every effort has been made to obtain as much relevant data as possible for this evaluation. It is important to note that the conclusions that lead to this procedure are derived in large part from the available data. Always take into account that the treatment will also be dependent on availability of resources and existing treatment guidelines, considered by other Pain Management Practitioners as being common knowledge and practice, at the time of the intervention. For Medico-Legal purposes, it is also important to point out that variation in procedural techniques and pharmacological choices are the acceptable norm. The indications, contraindications, technique, and results of the above procedure should only be interpreted and judged by a Board-Certified Interventional Pain Specialist with extensive familiarity and expertise in the same exact procedure and technique.

## 2019-12-14 ENCOUNTER — Ambulatory Visit (HOSPITAL_BASED_OUTPATIENT_CLINIC_OR_DEPARTMENT_OTHER): Payer: Medicare Other | Admitting: Pain Medicine

## 2019-12-14 ENCOUNTER — Other Ambulatory Visit: Payer: Self-pay

## 2019-12-14 ENCOUNTER — Encounter: Payer: Self-pay | Admitting: Pain Medicine

## 2019-12-14 ENCOUNTER — Ambulatory Visit
Admission: RE | Admit: 2019-12-14 | Discharge: 2019-12-14 | Disposition: A | Discharge status: Home / Self Care | Payer: Medicare Other | Source: Ambulatory Visit | Attending: Pain Medicine | Admitting: Pain Medicine

## 2019-12-14 VITALS — BP 127/84 | HR 76 | Temp 97.8°F | Resp 18 | Ht 73.0 in | Wt 259.0 lb

## 2019-12-14 DIAGNOSIS — M545 Low back pain, unspecified: Secondary | ICD-10-CM | POA: Insufficient documentation

## 2019-12-14 DIAGNOSIS — G8929 Other chronic pain: Secondary | ICD-10-CM | POA: Diagnosis not present

## 2019-12-14 DIAGNOSIS — T402X5A Adverse effect of other opioids, initial encounter: Secondary | ICD-10-CM | POA: Diagnosis not present

## 2019-12-14 DIAGNOSIS — Z91041 Radiographic dye allergy status: Secondary | ICD-10-CM

## 2019-12-14 DIAGNOSIS — M792 Neuralgia and neuritis, unspecified: Secondary | ICD-10-CM | POA: Insufficient documentation

## 2019-12-14 DIAGNOSIS — M51369 Other intervertebral disc degeneration, lumbar region without mention of lumbar back pain or lower extremity pain: Secondary | ICD-10-CM

## 2019-12-14 DIAGNOSIS — M5136 Other intervertebral disc degeneration, lumbar region: Secondary | ICD-10-CM | POA: Diagnosis not present

## 2019-12-14 DIAGNOSIS — Z91013 Allergy to seafood: Secondary | ICD-10-CM | POA: Insufficient documentation

## 2019-12-14 DIAGNOSIS — M8949 Other hypertrophic osteoarthropathy, multiple sites: Secondary | ICD-10-CM

## 2019-12-14 DIAGNOSIS — M47817 Spondylosis without myelopathy or radiculopathy, lumbosacral region: Secondary | ICD-10-CM | POA: Diagnosis not present

## 2019-12-14 DIAGNOSIS — G894 Chronic pain syndrome: Secondary | ICD-10-CM | POA: Insufficient documentation

## 2019-12-14 DIAGNOSIS — M47816 Spondylosis without myelopathy or radiculopathy, lumbar region: Secondary | ICD-10-CM | POA: Insufficient documentation

## 2019-12-14 DIAGNOSIS — Z883 Allergy status to other anti-infective agents status: Secondary | ICD-10-CM

## 2019-12-14 DIAGNOSIS — R55 Syncope and collapse: Secondary | ICD-10-CM

## 2019-12-14 DIAGNOSIS — G8918 Other acute postprocedural pain: Secondary | ICD-10-CM | POA: Diagnosis not present

## 2019-12-14 DIAGNOSIS — M159 Polyosteoarthritis, unspecified: Secondary | ICD-10-CM

## 2019-12-14 DIAGNOSIS — K5903 Drug induced constipation: Secondary | ICD-10-CM

## 2019-12-14 DIAGNOSIS — M150 Primary generalized (osteo)arthritis: Secondary | ICD-10-CM

## 2019-12-14 MED ORDER — MELOXICAM 15 MG PO TABS: 15.0000 mg | ORAL_TABLET | Freq: Every day | ORAL | 2 refills | Status: DC

## 2019-12-14 MED ORDER — LACTATED RINGERS IV SOLN
1000.0000 mL | Freq: Once | INTRAVENOUS | Status: AC
  Administered 2019-12-14: 1000 mL via INTRAVENOUS

## 2019-12-14 MED ORDER — GABAPENTIN 100 MG PO CAPS: ORAL_CAPSULE | ORAL | 0 refills | Status: DC

## 2019-12-14 MED ORDER — HYDROCODONE-ACETAMINOPHEN 5-325 MG PO TABS: 1.0000 | ORAL_TABLET | Freq: Three times a day (TID) | ORAL | 0 refills | Status: AC | PRN

## 2019-12-14 MED ORDER — LIDOCAINE HCL 2 % IJ SOLN
20.0000 mL | Freq: Once | INTRAMUSCULAR | Status: AC
  Administered 2019-12-14: 400 mg

## 2019-12-14 MED ORDER — ROPIVACAINE HCL 2 MG/ML IJ SOLN
9.0000 mL | Freq: Once | INTRAMUSCULAR | Status: AC
  Administered 2019-12-14: 9 mL via PERINEURAL

## 2019-12-14 MED ORDER — GLYCOPYRROLATE 0.2 MG/ML IJ SOLN
0.2000 mg | Freq: Once | INTRAMUSCULAR | Status: AC
  Administered 2019-12-14: 0.2 mg via INTRAVENOUS
  Filled 2019-12-14: qty 1

## 2019-12-14 MED ORDER — TRIAMCINOLONE ACETONIDE 40 MG/ML IJ SUSP
INTRAMUSCULAR | Status: AC
  Filled 2019-12-14: qty 1

## 2019-12-14 MED ORDER — LIDOCAINE HCL 2 % IJ SOLN
INTRAMUSCULAR | Status: AC
  Filled 2019-12-14: qty 20

## 2019-12-14 MED ORDER — FENTANYL CITRATE (PF) 100 MCG/2ML IJ SOLN
25.0000 ug | INTRAMUSCULAR | Status: DC | PRN
  Administered 2019-12-14: 100 ug via INTRAVENOUS

## 2019-12-14 MED ORDER — BENEFIBER PO POWD: 6.0000 g | Freq: Three times a day (TID) | ORAL | 2 refills | Status: AC

## 2019-12-14 MED ORDER — MIDAZOLAM HCL 5 MG/5ML IJ SOLN
1.0000 mg | INTRAMUSCULAR | Status: DC | PRN
  Administered 2019-12-14: 2 mg via INTRAVENOUS

## 2019-12-14 MED ORDER — MORPHINE SULFATE ER 30 MG PO TBCR: 30.0000 mg | EXTENDED_RELEASE_TABLET | Freq: Two times a day (BID) | ORAL | 0 refills | Status: DC

## 2019-12-14 MED ORDER — MIDAZOLAM HCL 5 MG/5ML IJ SOLN
INTRAMUSCULAR | Status: AC
  Filled 2019-12-14: qty 5

## 2019-12-14 MED ORDER — FENTANYL CITRATE (PF) 100 MCG/2ML IJ SOLN
INTRAMUSCULAR | Status: AC
  Filled 2019-12-14: qty 2

## 2019-12-14 MED ORDER — TRIAMCINOLONE ACETONIDE 40 MG/ML IJ SUSP
40.0000 mg | Freq: Once | INTRAMUSCULAR | Status: AC
  Administered 2019-12-14: 40 mg

## 2019-12-14 MED ORDER — LUBIPROSTONE 8 MCG PO CAPS: 8.0000 ug | ORAL_CAPSULE | Freq: Two times a day (BID) | ORAL | 2 refills | Status: DC

## 2019-12-14 MED ORDER — ROPIVACAINE HCL 2 MG/ML IJ SOLN
INTRAMUSCULAR | Status: AC
  Filled 2019-12-14: qty 10

## 2019-12-14 NOTE — Progress Notes (Signed)
Safety precautions to be maintained throughout the outpatient stay will include: orient to surroundings, keep bed in low position, maintain call bell within reach at all times, provide assistance with transfer out of bed and ambulation.  

## 2019-12-14 NOTE — Patient Instructions (Signed)

## 2019-12-15 ENCOUNTER — Telehealth: Payer: Self-pay

## 2019-12-15 NOTE — Telephone Encounter (Signed)
Hey [name],  Appt was canceled with PCP his next appt with PCP is 03/2020. Do you still want to r/s f/u with you 01/2020?

## 2019-12-15 NOTE — Telephone Encounter (Signed)
Post procedure phone  cAll. LM

## 2019-12-17 DIAGNOSIS — E118 Type 2 diabetes mellitus with unspecified complications: Secondary | ICD-10-CM | POA: Diagnosis not present

## 2019-12-17 DIAGNOSIS — Z794 Long term (current) use of insulin: Secondary | ICD-10-CM | POA: Diagnosis not present

## 2019-12-18 ENCOUNTER — Ambulatory Visit: Payer: Self-pay | Admitting: *Deleted

## 2019-12-20 NOTE — Chronic Care Management (AMB) (Signed)
°  Care Management   Note  12/20/2019 Name: Alan Schmidt MRN: [id number] DOB: 14-Nov-1945  Alan Schmidt is a 74 y.o. year old male who is a primary care patient of [name], MD and is actively engaged with the care management team. I reached out to [name] by phone today to assist with re-scheduling a follow up visit with the Pharmacist  Follow up plan: Telephone appointment with care management team member scheduled for:01/16/2020  Noreene Larsson, [place], [place], [place] [address] Direct Dial: [phone] [email or web address]: [email or web address]

## 2019-12-20 NOTE — Telephone Encounter (Signed)
Pt has been r/s  

## 2019-12-21 DIAGNOSIS — M25551 Pain in right hip: Secondary | ICD-10-CM | POA: Diagnosis not present

## 2019-12-21 DIAGNOSIS — M6248 Contracture of muscle, other site: Secondary | ICD-10-CM | POA: Diagnosis not present

## 2019-12-25 ENCOUNTER — Encounter: Payer: Medicare Other | Admitting: Pain Medicine

## 2019-12-26 ENCOUNTER — Ambulatory Visit: Payer: Medicare Other | Admitting: Family Medicine

## 2019-12-26 ENCOUNTER — Telehealth: Payer: Self-pay

## 2019-12-26 NOTE — Progress Notes (Signed)
Called and LVM for the patient to call back and schedule a appointment with provider in 3 months.  Katelen Luepke,cma

## 2019-12-26 NOTE — Telephone Encounter (Signed)
-----   Message from [name], MD sent at 12/08/2019  9:21 AM EDT ----- I forgot to put follow-up in for him during his visit on Monday. Can you get him scheduled for about 3 months?

## 2020-01-01 ENCOUNTER — Telehealth: Payer: Self-pay | Admitting: Family Medicine

## 2020-01-01 NOTE — Telephone Encounter (Signed)
Please let the patient know that I heard back from the pharmacist on other options for his pain. She mentioned celebrex would be a good option. Is he interested in trying that medication?

## 2020-01-01 NOTE — Telephone Encounter (Signed)
-----   Message from De Hollingshead, [place] sent at 12/08/2019 12:43 PM EDT ----- Marykay Lex,   I think Celebrex would be good.   The very-over-simplified from various comparison trials:  - Celecoxib - good option if most worried about GI toxicity - Naproxen - good option if most worried about CV toxicity  PO diclofenac may have a higher CV risk than other NSAIDs.  ----- Message ----- From: [name], MD Sent: 12/08/2019   9:22 AM EDT To: De Hollingshead, RPH-CPP  Hey,   Mr Snowdon noted that mobic has provided no benefit for him. Would celebrex be an ok option with his other medical issues? Or would something like diclofenac orally be a better option? Thanks.   Randall Hiss

## 2020-01-02 MED ORDER — CELECOXIB 100 MG PO CAPS: 100.0000 mg | ORAL_CAPSULE | Freq: Two times a day (BID) | ORAL | 0 refills | Status: DC | PRN

## 2020-01-02 NOTE — Telephone Encounter (Signed)
Lawyer returned the call and is agreeable to starting Celebrex.

## 2020-01-02 NOTE — Telephone Encounter (Signed)
Left message to call back  

## 2020-01-02 NOTE — Telephone Encounter (Signed)
Attempted to call and speak to [facility]. Could not leave a voicemail.

## 2020-01-02 NOTE — Telephone Encounter (Signed)
Sent to pharmacy. He should not take any other NSAIDs with this including ibuprofen or aleve. If his stomach starts to bother him while on this medication he needs to let us know right away. Thanks.

## 2020-01-02 NOTE — Addendum Note (Signed)
Addended by: [name], Demarie Hyneman G on: 01/02/2020 02:15 PM   Modules accepted: Orders

## 2020-01-08 ENCOUNTER — Other Ambulatory Visit: Payer: Self-pay | Admitting: *Deleted

## 2020-01-08 NOTE — Patient Outreach (Signed)
[facility]) Care Management  01/08/2020  SHARROD ACHILLE 12-Aug-1945 [id number]  Unsuccessful outreach attempt made to patient. RN Health Coach left HIPAA compliant voicemail message along with her contact information.  Plan: RN Health Coach will call patient within the month of January and will send patient an unsuccessful letter.   [name] RN, BSN [place] [phone] [email or web address]

## 2020-01-08 NOTE — Telephone Encounter (Signed)
LMTCB

## 2020-01-09 NOTE — Telephone Encounter (Signed)
Spoke with patient about not taking any NSAIDS and to let us know if he gets any stomach issues while on the medication; patient agrees to do so.

## 2020-01-10 NOTE — Telephone Encounter (Signed)
I called and LVM informing the patient that he is not to take any NSAIDS per the provider.  Marlo Arriola,cma

## 2020-01-10 NOTE — Telephone Encounter (Signed)
Patient called back to find out what medication he is not suppose to be taking

## 2020-01-16 ENCOUNTER — Telehealth: Payer: Medicare Other

## 2020-01-16 ENCOUNTER — Telehealth: Payer: Self-pay | Admitting: Pharmacist

## 2020-01-16 ENCOUNTER — Other Ambulatory Visit: Payer: Self-pay | Admitting: Family Medicine

## 2020-01-16 DIAGNOSIS — E1142 Type 2 diabetes mellitus with diabetic polyneuropathy: Secondary | ICD-10-CM

## 2020-01-16 DIAGNOSIS — F419 Anxiety disorder, unspecified: Secondary | ICD-10-CM

## 2020-01-16 NOTE — Telephone Encounter (Signed)
  Chronic Care Management   Note  01/16/2020 Name: Alan Schmidt MRN: [id number] DOB: Feb 23, 1945   Attempted to contact patient for scheduled appointment for medication management support. Left HIPAA compliant message for patient to return my call at their convenience.    Plan: - Third consecutive unsuccessful outreach. Will close CCM case.    [name], PharmD, [place], [place] Clinical Pharmacist [employer] at [place]

## 2020-01-18 ENCOUNTER — Telehealth: Payer: Self-pay

## 2020-01-18 DIAGNOSIS — E785 Hyperlipidemia, unspecified: Secondary | ICD-10-CM

## 2020-01-18 DIAGNOSIS — Z794 Long term (current) use of insulin: Secondary | ICD-10-CM

## 2020-01-18 DIAGNOSIS — I10 Essential (primary) hypertension: Secondary | ICD-10-CM

## 2020-01-18 NOTE — Telephone Encounter (Signed)
Yes, that would be fine. Thank you.

## 2020-01-18 NOTE — Telephone Encounter (Signed)
Patient LVM after hours. He noted that the holidays are a tough time for him. Collaborating with CMA to get patient in scheduled for lab work as he is overdue for that.  Will collaborate w/ Care Guide to outreach patient to schedule f/u with me in January, after patient is able to get in for comprehensive lab work.   Please let patient know that his glucose readings are transmitting to my provider portal via [facility], but that I sent him an email to link to the [employer] portal so that Dr. Biagio Quint can access his readings prior to appointments as well.

## 2020-01-18 NOTE — Telephone Encounter (Signed)
Do you want me to reach out to pt after I see he is scheduled for labs?

## 2020-01-21 NOTE — Progress Notes (Signed)
Patient: Alan Schmidt  Service Category: E/M  Provider: Gaspar Cola, MD  DOB: [birth year]  DOS: 01/24/2020  Location: Office  MRN: [id number]  Setting: Ambulatory outpatient  Referring Provider: [name], MD  Type: Established Patient  Specialty: Interventional Pain Management  PCP: [name], MD  Location: Remote location  Delivery: TeleHealth     Virtual Encounter - Pain Management PROVIDER NOTE: Information contained herein reflects review and annotations entered in association with encounter. Interpretation of such information and data should be left to medically-trained personnel. Information provided to patient can be located elsewhere in the medical record under "Patient Instructions". Document created using STT-dictation technology, any transcriptional errors that may result from process are unintentional.    Contact & Pharmacy Preferred: [phone] Home: [phone] (home) Mobile: [phone] (mobile) E-mail: [email or web address]  [id number] [facility], [place] [address] [place] [id number] Phone: [phone] Fax: [phone]  [id number] - [place], [place] - [address] AT [facility] [address] [place] [address] Phone: [phone] Fax: [phone]   Pre-screening  Mr. Grandville Silos offered "in-person" vs "virtual" encounter. He indicated preferring virtual for this encounter.   Reason COVID-19*  Social distancing based on CDC and AMA recommendations.   I contacted Alan Schmidt on 01/24/2020 via telephone.      I clearly identified myself as Gaspar Cola, MD. I verified that I was speaking with the correct person using two identifiers (Name: LAWERENCE DERY, and date of birth: 12-28-1995).  Consent I sought verbal advanced consent from Alan Schmidt for virtual visit interactions. I informed Mr. Repass of possible security and privacy  concerns, risks, and limitations associated with providing "not-in-person" medical evaluation and management services. I also informed Mr. Hemann of the availability of "in-person" appointments. Finally, I informed him that there would be a charge for the virtual visit and that he could be  personally, fully or partially, financially responsible for it. Mr. Hartwell expressed understanding and agreed to proceed.   Historic Elements   Mr. KINTE TRIM is a 74 y.o. year old, male patient evaluated today after our last contact on 12/14/2019. Mr. Steppe  has a past medical history of Acute postoperative pain (11/24/2016), Anxiety, Chronic hip pain (Right) (12/05/2014), Chronic lumbar pain, Depression, Diabetes mellitus without complication ([place]), Hyperlipidemia, Hypertension, Kidney stones, and Migraines. He also  has a past surgical history that includes Total hip arthroplasty; Tonsillectomy; and right hip surgery. Mr. Kanady has a current medication list which includes the following prescription(s): acetaminophen, alprazolam, amlodipine, aspirin ec, bupropion, carvedilol, celecoxib, freestyle libre 14 day reader, freestyle libre 14 day sensor, desoximetasone, escitalopram, hydrochlorothiazide, insulin lispro, bd pen needle nano u/f, jardiance, loratadine, lubiprostone, meloxicam, metformin, mometasone, [START ON 01/26/2020] morphine, [START ON 02/25/2020] morphine, naloxone, olmesartan, pantoprazole, pravastatin, sennosides-docusate sodium, tamsulosin, tresiba flextouch, victoza, benefiber, [START ON 01/27/2020] gabapentin, and [START ON 03/26/2020] morphine. He  reports that he has quit smoking. His smoking use included cigarettes. He has never used smokeless tobacco. He reports that he does not drink alcohol and does not use drugs. Mr. Bangs is allergic to contrast media [iodinated diagnostic agents], iodine, and shellfish allergy.   HPI  Today, he is being contacted for a post-procedure assessment.  Today we will assess his use of the gabapentin and once he is stable on it we will be transferring there to his primary care physician. On the patient's last visit he had indicated that  he was taking the 100 mg capsule, 300 mg at bedtime and 100 mg twice daily. He was given refills until 03/12/2020. He has been taking MS Contin 30 mg twice daily. Soon he will need to be started on an opioid taper to accomplish a "Drug Holiday".  As it turns out, he has not been taking the gabapentin as I had prescribed.  He is only taking 300 mg at bedtime in the form of 3 100 mg capsules.  He refers that he is really not having that much burning sensation or electrical-like sensations for which we have prescribed bad gabapentin.  It would seem that the 300 mg at bedtime is taking care of those symptoms.  Because of that, and the fact that he is not having any side effects to that medicine, today I will be simplifying his regimen by switching him to the 300 mg capsule, which he will then be taking 1 at bedtime.  He was informed of this and he was informed that as soon as he gets those, he should quit taking that 100 mg capsules.  He understood and accepted.  In view of the fact that he is stable on this dose, I will be transferring that nonnarcotic to his PCP.  Today the patient was informed that we will probably commence an opioid taper on his next visit so as to complete a "Drug Holiday" for the purpose of getting rid of his opioid tolerance.  He indicated having understood and accepted.  Nonopioids transferred 12/14/2019: Amitiza, Benefiber, Mobic, and today I will be transferring the gabapentin.  Post-Procedure Evaluation  Procedure (12/14/2019): Therapeutic/palliative right lumbar facet RFA #6 under fluoroscopic guidance and IV sedation Pre-procedure pain level: 4/10 Post-procedure: 0/10 (100% relief)  Sedation: Please see nurses note.  Effectiveness during initial hour after procedure(Ultra-Short Term Relief): 100  %.  Local anesthetic used: Long-acting (4-6 hours) Effectiveness: Defined as any analgesic benefit obtained secondary to the administration of local anesthetics. This carries significant diagnostic value as to the etiological location, or anatomical origin, of the pain. Duration of benefit is expected to coincide with the duration of the local anesthetic used.  Effectiveness during initial 4-6 hours after procedure(Short-Term Relief): 100 % (went home and went to sleep but reports that he was very relieved and had some good relief.).  Long-term benefit: Defined as any relief past the pharmacologic duration of the local anesthetics.  Effectiveness past the initial 6 hours after procedure(Long-Term Relief): 70 % (pain is much better.).  Current benefits: Defined as benefit that persist at this time.   Analgesia:  70% relief.  Function: Mr. Bublitz reports improvement in function ROM: Mr. Rugg reports improvement in ROM  Pharmacotherapy Assessment  Analgesic: Morphine ER (MS Contin) 30 mg, 1 tab p.o. twice daily (60 mg/day).  (Previously on oxycodone IR 10 mg every 6 hours (40 mg/day)) MME/day:60 mg/day.   Monitoring: [place] PMP: PDMP reviewed during this encounter.       Pharmacotherapy: No side-effects or adverse reactions reported. Compliance: No problems identified. Effectiveness: Clinically acceptable. Plan: Refer to "POC".  UDS:  Summary  Date Value Ref Range Status  05/30/2019 Note  Final    Comment:    ==================================================================== [employer] Select 13 (MW) ==================================================================== Test                             Result       Flag       Units Drug Present and  Declared for Prescription Verification   Alprazolam                     99           EXPECTED   ng/mg creat   Alpha-hydroxyalprazolam        256          EXPECTED   ng/mg creat    Source of alprazolam is a scheduled prescription  medication. Alpha-    hydroxyalprazolam is an expected metabolite of alprazolam.   Oxycodone                      1380         EXPECTED   ng/mg creat   Noroxycodone                   1789         EXPECTED   ng/mg creat    Sources of oxycodone include scheduled prescription medications.    Noroxycodone is an expected metabolite of oxycodone. Drug Present not Declared for Prescription Verification   Hydrocodone                    100          UNEXPECTED ng/mg creat   Dihydrocodeine                 69           UNEXPECTED ng/mg creat   Norhydrocodone                 117          UNEXPECTED ng/mg creat    Sources of hydrocodone include scheduled prescription medications.    Dihydrocodeine and norhydrocodone are expected metabolites of    hydrocodone. Dihydrocodeine is also available as a scheduled    prescription medication. ==================================================================== Test                      Result    Flag   Units      Ref Range   Creatinine              75               mg/dL      >=20 ==================================================================== Declared Medications:  The flagging and interpretation on this report are based on the  following declared medications.  Unexpected results may arise from  inaccuracies in the declared medications.  **Note: The testing scope of this panel includes these medications:  Alprazolam (Xanax)  Oxycodone  **Note: The testing scope of this panel does not include the  following reported medications:  Acetaminophen (Tylenol)  Amlodipine (Norvasc)  Aspirin  Bupropion (Wellbutrin)  Carvedilol (Coreg)  Desoximetasone (Topicort)  Empagliflozin (Jardiance)  Escitalopram (Lexapro)  Hydrochlorothiazide  Insulin Tyler Aas)  Liraglutide  Loratadine (Claritin)  Lubiprostone (Amitiza)  Meloxicam (Mobic)  Metformin (Glucophage)  Mometasone (Nasonex)  Naloxone (Narcan)  Olmesartan (Benicar)  Pantoprazole (Protonix)   Pravastatin (Pravachol)  Sennosides (Senokot)  Tamsulosin (Flomax)  Topical ==================================================================== For clinical consultation, please call [phone]. ====================================================================     Laboratory Chemistry Profile   Renal Lab Results  Component Value Date   BUN 19 05/15/2019   CREATININE 1.08 05/15/2019   GFR 81.04 05/15/2019   GFRAA >60 03/26/2018   GFRNONAA >60 03/26/2018     Hepatic Lab Results  Component Value Date   AST 23 10/26/2018  ALT 25 10/26/2018   ALBUMIN 4.0 10/26/2018   ALKPHOS 109 10/26/2018     Electrolytes Lab Results  Component Value Date   NA 137 05/15/2019   K 4.3 05/15/2019   CL 100 05/15/2019   CALCIUM 9.3 05/15/2019   MG 1.8 03/06/2015     Bone No results found for: VD25OH, VD125OH2TOT, XL2440NU2, VO5366YQ0, 25OHVITD1, 25OHVITD2, 25OHVITD3, TESTOFREE, TESTOSTERONE   Inflammation (CRP: Acute Phase) (ESR: Chronic Phase) Lab Results  Component Value Date   CRP 0.5 03/06/2015   ESRSEDRATE 45 (H) 12/14/2016       Note: Above Lab results reviewed.  Imaging  DG PAIN CLINIC C-ARM 1-60 MIN NO REPORT Fluoro was used, but no Radiologist interpretation will be provided.  Please refer to "NOTES" tab for provider progress note.  Assessment  The primary encounter diagnosis was Chronic pain syndrome. 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  Plan of Care  Problem-specific:  No problem-specific Assessment & Plan notes found for this encounter.  Mr. PILOT PRINDLE has a current medication list which includes the following long-term medication(s): amlodipine, bupropion, carvedilol, escitalopram, [START ON 01/27/2020] gabapentin, hydrochlorothiazide, insulin lispro,  loratadine, lubiprostone, meloxicam, metformin, mometasone, [START ON 01/26/2020] morphine, [START ON 02/25/2020] morphine, [START ON 03/26/2020] morphine, olmesartan, pantoprazole, pravastatin, victoza, and benefiber.  Pharmacotherapy (Medications Ordered): Meds ordered this encounter  Medications  . gabapentin (NEURONTIN) 300 MG capsule    Sig: Take 1 capsule (300 mg total) by mouth at bedtime.    Dispense:  90 capsule    Refill:  0    Fill one day early if pharmacy is closed on scheduled refill date. Generic permitted. Do not send renewal requests. Void any older duplicate prescription or refill(s) that may be on file.  . morphine (MS CONTIN) 30 MG 12 hr tablet    Sig: Take 1 tablet (30 mg total) by mouth every 12 (twelve) hours. Must last 30 days. Do not break tablet    Dispense:  60 tablet    Refill:  0    Chronic Pain: STOP Act (Not applicable) Fill 1 day early if closed on refill date. Avoid benzodiazepines within 8 hours of opioids   Orders:  No orders of the defined types were placed in this encounter.  Follow-up plan:   Return in about 3 months (around 04/25/2020) for (F2F), (Med Mgmt).      Interventional management options: Planned, scheduled, and/or pending:   Therapeutic caudal epidural steroid injection under fluoroscopic guidance and IV sedation   Considering:   Diagnostic Left GONB  Possible Left GON RFA    Procedural information:   NOTE: NO MORE RFA procedures. Difficulty following intra-procedural instructions and commands, resulting in unnecessarily long exposure of staff to radiation. Therapeutic left lumbar facet RFA x2 (last done on 05/09/2019) Therapeutic right lumbar facet RFA x6 (last done on 12/14/2019)   Palliative PRN treatment(s):   Palliative right lumbar facet block #3  Palliative left lumbar facet block #3  Therapeutic left cervical ESI #2      Recent Visits Date Type Provider Dept  12/14/19 Procedure visit Milinda Pointer, MD [facility]  11/07/19 Telemedicine Milinda Pointer, MD [facility]  Showing recent visits within past 90 days and meeting all other requirements Today's Visits Date Type Provider Dept  01/24/20 Telemedicine Milinda Pointer, MD [facility]  Showing today's visits and  meeting all other requirements Future Appointments Date Type Provider Dept  03/11/20 Appointment Milinda Pointer, MD [facility]  Showing future appointments within next 90 days and meeting all other requirements  I discussed the assessment and treatment plan with the patient. The patient was provided an opportunity to ask questions and all were answered. The patient agreed with the plan and demonstrated an understanding of the instructions.  Patient advised to call back or seek an in-person evaluation if the symptoms or condition worsens.  Duration of encounter: 18 minutes.  Note by: Gaspar Cola, MD Date: 01/24/2020; Time: 11:05 AM

## 2020-01-22 ENCOUNTER — Other Ambulatory Visit: Payer: Self-pay | Admitting: Family Medicine

## 2020-01-22 DIAGNOSIS — E118 Type 2 diabetes mellitus with unspecified complications: Secondary | ICD-10-CM | POA: Diagnosis not present

## 2020-01-22 DIAGNOSIS — Z794 Long term (current) use of insulin: Secondary | ICD-10-CM | POA: Diagnosis not present

## 2020-01-24 ENCOUNTER — Other Ambulatory Visit: Payer: Self-pay

## 2020-01-24 ENCOUNTER — Ambulatory Visit: Payer: Medicare Other | Attending: Pain Medicine | Admitting: Pain Medicine

## 2020-01-24 DIAGNOSIS — M792 Neuralgia and neuritis, unspecified: Secondary | ICD-10-CM | POA: Insufficient documentation

## 2020-01-24 DIAGNOSIS — M47816 Spondylosis without myelopathy or radiculopathy, lumbar region: Secondary | ICD-10-CM | POA: Insufficient documentation

## 2020-01-24 DIAGNOSIS — G894 Chronic pain syndrome: Secondary | ICD-10-CM | POA: Insufficient documentation

## 2020-01-24 DIAGNOSIS — M545 Low back pain, unspecified: Secondary | ICD-10-CM | POA: Diagnosis not present

## 2020-01-24 DIAGNOSIS — F1120 Opioid dependence, uncomplicated: Secondary | ICD-10-CM | POA: Diagnosis present

## 2020-01-24 DIAGNOSIS — G8929 Other chronic pain: Secondary | ICD-10-CM | POA: Insufficient documentation

## 2020-01-24 DIAGNOSIS — Z79899 Other long term (current) drug therapy: Secondary | ICD-10-CM | POA: Insufficient documentation

## 2020-01-24 MED ORDER — GABAPENTIN 300 MG PO CAPS: 300.0000 mg | ORAL_CAPSULE | Freq: Every day | ORAL | 0 refills | Status: DC

## 2020-01-24 MED ORDER — MORPHINE SULFATE ER 30 MG PO TBCR: 30.0000 mg | EXTENDED_RELEASE_TABLET | Freq: Two times a day (BID) | ORAL | 0 refills | Status: DC

## 2020-01-24 NOTE — Progress Notes (Signed)
[facility] was called to cancel refill on 100mg  Gabapentin. Pharmacist stated that Pt had no refill on the 100mg  gabapentin and they did receive the new prescription for 300mg  gabapentin.

## 2020-01-24 NOTE — Patient Instructions (Addendum)
____________________________________________________________________________________________  Drug Holidays (Slow)  What is a "Drug Holiday"? Drug Holiday: is the name given to the period of time during which a patient stops taking a medication(s) for the purpose of eliminating tolerance to the drug.  Benefits . Improved effectiveness of opioids. . Decreased opioid dose needed to achieve benefits. . Improved pain with lesser dose.  What is tolerance? Tolerance: is the progressive decreased in effectiveness of a drug due to its repetitive use. With repetitive use, the body gets use to the medication and as a consequence, it loses its effectiveness. This is a common problem seen with opioid pain medications. As a result, a larger dose of the drug is needed to achieve the same effect that used to be obtained with a smaller dose.  How long should a "Drug Holiday" last? You should stay off of the pain medicine for at least 14 consecutive days. (2 weeks)  Should I stop the medicine "cold turkey"? No. You should always coordinate with your Pain Specialist so that he/she can provide you with the correct medication dose to make the transition as smoothly as possible.  How do I stop the medicine? Slowly. You will be instructed to decrease the daily amount of pills that you take by one (1) pill every seven (7) days. This is called a "slow downward taper" of your dose. For example: if you normally take four (4) pills per day, you will be asked to drop this dose to three (3) pills per day for seven (7) days, then to two (2) pills per day for seven (7) days, then to one (1) per day for seven (7) days, and at the end of those last seven (7) days, this is when the "Drug Holiday" would start.   Will I have withdrawals? By doing a "slow downward taper" like this one, it is unlikely that you will experience any significant withdrawal symptoms. Typically, what triggers withdrawals is the sudden stop of a high  dose opioid therapy. Withdrawals can usually be avoided by slowly decreasing the dose over a prolonged period of time. If you do not follow these instructions and decide to stop your medication abruptly, withdrawals may be possible.  What are withdrawals? Withdrawals: refers to the wide range of symptoms that occur after stopping or dramatically reducing opiate drugs after heavy and prolonged use. Withdrawal symptoms do not occur to patients that use low dose opioids, or those who take the medication sporadically. Contrary to benzodiazepine (example: Valium, Xanax, etc.) or alcohol withdrawals ("Delirium Tremens"), opioid withdrawals are not lethal. Withdrawals are the physical manifestation of the body getting rid of the excess receptors.  Expected Symptoms Early symptoms of withdrawal may include: . Agitation . Anxiety . Muscle aches . Increased tearing . Insomnia . Runny nose . Sweating . Yawning  Late symptoms of withdrawal may include: . Abdominal cramping . Diarrhea . Dilated pupils . Goose bumps . Nausea . Vomiting  Will I experience withdrawals? Due to the slow nature of the taper, it is very unlikely that you will experience any.  What is a slow taper? Taper: refers to the gradual decrease in dose.  (Last update: 08/23/2019) ____________________________________________________________________________________________    ____________________________________________________________________________________________  Medication Rules  Purpose: To inform patients, and their family members, of our rules and regulations.  Applies to: All patients receiving prescriptions (written or electronic).  Pharmacy of record: Pharmacy where electronic prescriptions will be sent. If written prescriptions are taken to a different pharmacy, please inform the nursing staff. The pharmacy   listed in the electronic medical record should be the one where you would like electronic prescriptions  to be sent.  Electronic prescriptions: In compliance with the  Strengthen Opioid Misuse Prevention (STOP) Act of 2017 (Session Law 2017-74/H243), effective February 02, 2018, all controlled substances must be electronically prescribed. Calling prescriptions to the pharmacy will cease to exist.  Prescription refills: Only during scheduled appointments. Applies to all prescriptions.  NOTE: The following applies primarily to controlled substances (Opioid* Pain Medications).   Type of encounter (visit): For patients receiving controlled substances, face-to-face visits are required. (Not an option or up to the patient.)  Patient's responsibilities: 1. Pain Pills: Bring all pain pills to every appointment (except for procedure appointments). 2. Pill Bottles: Bring pills in original pharmacy bottle. Always bring the newest bottle. Bring bottle, even if empty. 3. Medication refills: You are responsible for knowing and keeping track of what medications you take and those you need refilled. The day before your appointment: write a list of all prescriptions that need to be refilled. The day of the appointment: give the list to the admitting nurse. Prescriptions will be written only during appointments. No prescriptions will be written on procedure days. If you forget a medication: it will not be "Called in", "Faxed", or "electronically sent". You will need to get another appointment to get these prescribed. No early refills. Do not call asking to have your prescription filled early. 4. Prescription Accuracy: You are responsible for carefully inspecting your prescriptions before leaving our office. Have the discharge nurse carefully go over each prescription with you, before taking them home. Make sure that your name is accurately spelled, that your address is correct. Check the name and dose of your medication to make sure it is accurate. Check the number of pills, and the written instructions to  make sure they are clear and accurate. Make sure that you are given enough medication to last until your next medication refill appointment. 5. Taking Medication: Take medication as prescribed. When it comes to controlled substances, taking less pills or less frequently than prescribed is permitted and encouraged. Never take more pills than instructed. Never take medication more frequently than prescribed.  6. Inform other Doctors: Always inform, all of your healthcare providers, of all the medications you take. 7. Pain Medication from other Providers: You are not allowed to accept any additional pain medication from any other Doctor or Healthcare provider. There are two exceptions to this rule. (see below) In the event that you require additional pain medication, you are responsible for notifying us, as stated below. 8. Cough Medicine: Often these contain an opioid, such as codeine or hydrocodone. Never accept or take cough medicine containing these opioids if you are already taking an opioid* medication. The combination may cause respiratory failure and death. 9. Medication Agreement: You are responsible for carefully reading and following our Medication Agreement. This must be signed before receiving any prescriptions from our practice. Safely store a copy of your signed Agreement. Violations to the Agreement will result in no further prescriptions. (Additional copies of our Medication Agreement are available upon request.) 10. Laws, Rules, & Regulations: All patients are expected to follow all Federal and State Laws, Statutes, Rules, & Regulations. Ignorance of the Laws does not constitute a valid excuse.  11. Illegal drugs and Controlled Substances: The use of illegal substances (including, but not limited to marijuana and its derivatives) and/or the illegal use of any controlled substances is strictly prohibited. Violation of this rule may   result in the immediate and permanent discontinuation of any  and all prescriptions being written by our practice. The use of any illegal substances is prohibited. 12. Adopted CDC guidelines & recommendations: Target dosing levels will be at or below 60 MME/day. Use of benzodiazepines** is not recommended.  Exceptions: There are only two exceptions to the rule of not receiving pain medications from other Healthcare Providers. 1. Exception #1 (Emergencies): In the event of an emergency (i.e.: accident requiring emergency care), you are allowed to receive additional pain medication. However, you are responsible for: As soon as you are able, call our office [phone], at any time of the day or night, and leave a message stating your name, the date and nature of the emergency, and the name and dose of the medication prescribed. In the event that your call is answered by a member of our staff, make sure to document and save the date, time, and the name of the person that took your information.  2. Exception #2 (Planned Surgery): In the event that you are scheduled by another doctor or dentist to have any type of surgery or procedure, you are allowed (for a period no longer than 30 days), to receive additional pain medication, for the acute post-op pain. However, in this case, you are responsible for picking up a copy of our "Post-op Pain Management for Surgeons" handout, and giving it to your surgeon or dentist. This document is available at our office, and does not require an appointment to obtain it. Simply go to our office during business hours (Monday-Thursday from 8:00 AM to 4:00 PM) (Friday 8:00 AM to 12:00 Noon) or if you have a scheduled appointment with us, prior to your surgery, and ask for it by name. In addition, you are responsible for: calling our office [phone], at any time of the day or night, and leaving a message stating your name, name of your surgeon, type of surgery, and date of procedure or surgery. Failure to comply with your responsibilities  may result in termination of therapy involving the controlled substances.  *Opioid medications include: morphine, codeine, oxycodone, oxymorphone, hydrocodone, hydromorphone, meperidine, tramadol, tapentadol, buprenorphine, fentanyl, methadone. **Benzodiazepine medications include: diazepam (Valium), alprazolam (Xanax), clonazepam (Klonopine), lorazepam (Ativan), clorazepate (Tranxene), chlordiazepoxide (Librium), estazolam (Prosom), oxazepam (Serax), temazepam (Restoril), triazolam (Halcion) (Last updated: 01/01/2020) ____________________________________________________________________________________________   ____________________________________________________________________________________________  Medication Recommendations and Reminders  Applies to: All patients receiving prescriptions (written and/or electronic).  Medication Rules & Regulations: These rules and regulations exist for your safety and that of others. They are not flexible and neither are we. Dismissing or ignoring them will be considered "non-compliance" with medication therapy, resulting in complete and irreversible termination of such therapy. (See document titled "Medication Rules" for more details.) In all conscience, because of safety reasons, we cannot continue providing a therapy where the patient does not follow instructions.  Pharmacy of record:   Definition: This is the pharmacy where your electronic prescriptions will be sent.   We do not endorse any particular pharmacy, however, we have experienced problems with [facility] not securing enough medication supply for the community.  We do not restrict you in your choice of pharmacy. However, once we write for your prescriptions, we will NOT be re-sending more prescriptions to fix restricted supply problems created by your pharmacy, or your insurance.   The pharmacy listed in the electronic medical record should be the one where you want electronic prescriptions  to be sent.  If you choose to change pharmacy,   simply notify our nursing staff.  Recommendations:  Keep all of your pain medications in a safe place, under lock and key, even if you live alone. We will NOT replace lost, stolen, or damaged medication.  After you fill your prescription, take 1 week's worth of pills and put them away in a safe place. You should keep a separate, properly labeled bottle for this purpose. The remainder should be kept in the original bottle. Use this as your primary supply, until it runs out. Once it's gone, then you know that you have 1 week's worth of medicine, and it is time to come in for a prescription refill. If you do this correctly, it is unlikely that you will ever run out of medicine.  To make sure that the above recommendation works, it is very important that you make sure your medication refill appointments are scheduled at least 1 week before you run out of medicine. To do this in an effective manner, make sure that you do not leave the office without scheduling your next medication management appointment. Always ask the nursing staff to show you in your prescription , when your medication will be running out. Then arrange for the receptionist to get you a return appointment, at least 7 days before you run out of medicine. Do not wait until you have 1 or 2 pills left, to come in. This is very poor planning and does not take into consideration that we may need to cancel appointments due to bad weather, sickness, or emergencies affecting our staff.  DO NOT ACCEPT A "Partial Fill": If for any reason your pharmacy does not have enough pills/tablets to completely fill or refill your prescription, do not allow for a "partial fill". The law allows the pharmacy to complete that prescription within 72 hours, without requiring a new prescription. If they do not fill the rest of your prescription within those 72 hours, you will need a separate prescription to fill the  remaining amount, which we will NOT provide. If the reason for the partial fill is your insurance, you will need to talk to the pharmacist about payment alternatives for the remaining tablets, but again, DO NOT ACCEPT A PARTIAL FILL, unless you can trust your pharmacist to obtain the remainder of the pills within 72 hours.  Prescription refills and/or changes in medication(s):   Prescription refills, and/or changes in dose or medication, will be conducted only during scheduled medication management appointments. (Applies to both, written and electronic prescriptions.)  No refills on procedure days. No medication will be changed or started on procedure days. No changes, adjustments, and/or refills will be conducted on a procedure day. Doing so will interfere with the diagnostic portion of the procedure.  No phone refills. No medications will be "called into the pharmacy".  No Fax refills.  No weekend refills.  No Holliday refills.  No after hours refills.  Remember:  Business hours are:  Monday to Thursday 8:00 AM to 4:00 PM Provider's Schedule: Isiac Breighner, MD - Appointments are:  Medication management: Monday and Wednesday 8:00 AM to 4:00 PM Procedure day: Tuesday and Thursday 7:30 AM to 4:00 PM Bilal Lateef, MD - Appointments are:  Medication management: Tuesday and Thursday 8:00 AM to 4:00 PM Procedure day: Monday and Wednesday 7:30 AM to 4:00 PM (Last update: 08/23/2019) ____________________________________________________________________________________________   ____________________________________________________________________________________________  CBD (cannabidiol) WARNING  Applicable to: All individuals currently taking or considering taking CBD (cannabidiol) and, more important, all patients taking opioid analgesic controlled substances (pain   medication). (Example: oxycodone; oxymorphone; hydrocodone; hydromorphone; morphine; methadone; tramadol; tapentadol;  fentanyl; buprenorphine; butorphanol; dextromethorphan; meperidine; codeine; etc.)  Legal status: CBD remains a Schedule I drug prohibited for any use. CBD is illegal with one exception. In the [place], CBD has a limited Food and Drug Administration (FDA) approval for the treatment of two specific types of epilepsy disorders. Only one CBD product has been approved by the FDA for this purpose: "Epidiolex". FDA is aware that some companies are marketing products containing cannabis and cannabis-derived compounds in ways that violate the Federal Food, Drug and Cosmetic Act (FD&C Act) and that may put the health and safety of consumers at risk. The FDA, a Federal agency, has not enforced the CBD status since 2018.   Legality: Some manufacturers ship CBD products nationally, which is illegal. Often such products are sold online and are therefore available throughout the country. CBD is openly sold in head shops and health food stores in some states where such sales have not been explicitly legalized. Selling unapproved products with unsubstantiated therapeutic claims is not only a violation of the law, but also can put patients at risk, as these products have not been proven to be safe or effective. Federal illegality makes it difficult to conduct research on CBD.  Reference: "FDA Regulation of Cannabis and Cannabis-Derived Products, Including Cannabidiol (CBD)" - https://www.fda.gov/news-events/public-health-focus/fda-regulation-cannabis-and-cannabis-derived-products-including-cannabidiol-cbd  Warning: CBD is not FDA approved and has not undergo the same manufacturing controls as prescription drugs.  This means that the purity and safety of available CBD may be questionable. Most of the time, despite manufacturer's claims, it is contaminated with THC (delta-9-tetrahydrocannabinol - the chemical in marijuana responsible for the "HIGH").  When this is the case, the THC contaminant will trigger a positive  urine drug screen (UDS) test for Marijuana (carboxy-THC). Because a positive UDS for any illicit substance is a violation of our medication agreement, your opioid analgesics (pain medicine) may be permanently discontinued.  MORE ABOUT CBD  General Information: CBD  is a derivative of the Marijuana (cannabis sativa) plant discovered in 1940. It is one of the 113 identified substances found in Marijuana. It accounts for up to 40% of the plant's extract. As of 2018, preliminary clinical studies on CBD included research for the treatment of anxiety, movement disorders, and pain. CBD is available and consumed in multiple forms, including inhalation of smoke or vapor, as an aerosol spray, and by mouth. It may be supplied as an oil containing CBD, capsules, dried cannabis, or as a liquid solution. CBD is thought not to be as psychoactive as THC (delta-9-tetrahydrocannabinol - the chemical in marijuana responsible for the "HIGH"). Studies suggest that CBD may interact with different biological target receptors in the body, including cannabinoid and other neurotransmitter receptors. As of 2018 the mechanism of action for its biological effects has not been determined.  Side-effects  Adverse reactions: Dry mouth, diarrhea, decreased appetite, fatigue, drowsiness, malaise, weakness, sleep disturbances, and others.  Drug interactions: CBC may interact with other medications such as blood-thinners. (Last update: 09/09/2019) ____________________________________________________________________________________________    

## 2020-01-31 ENCOUNTER — Other Ambulatory Visit: Payer: Medicare Other

## 2020-02-07 ENCOUNTER — Other Ambulatory Visit: Payer: Self-pay | Admitting: *Deleted

## 2020-02-07 NOTE — Patient Outreach (Signed)
[facility]) Care Management  02/07/2020  Alan Schmidt 1945-06-11 [id number]  Unsuccessful outreach attempt made to patient. RN Health Coach left HIPAA compliant voicemail message along with her contact information.  Plan: RN Health Coach will call patient within the month of February.  Blanchie Serve RN, BSN [facility] Care Management  RN Health Coach [phone] [email or web address]

## 2020-02-12 ENCOUNTER — Other Ambulatory Visit: Payer: Self-pay | Admitting: Family Medicine

## 2020-02-13 ENCOUNTER — Encounter: Payer: Self-pay | Admitting: Internal Medicine

## 2020-02-13 NOTE — Progress Notes (Signed)
A user error has taken place: encounter opened in error, closed for administrative reasons.

## 2020-02-14 ENCOUNTER — Other Ambulatory Visit: Payer: Medicare Other

## 2020-02-14 ENCOUNTER — Other Ambulatory Visit: Payer: Self-pay | Admitting: Family Medicine

## 2020-02-14 ENCOUNTER — Other Ambulatory Visit: Payer: Self-pay | Admitting: Pain Medicine

## 2020-02-14 DIAGNOSIS — F419 Anxiety disorder, unspecified: Secondary | ICD-10-CM

## 2020-02-14 DIAGNOSIS — F32A Depression, unspecified: Secondary | ICD-10-CM

## 2020-02-14 DIAGNOSIS — G8929 Other chronic pain: Secondary | ICD-10-CM

## 2020-02-14 NOTE — Telephone Encounter (Signed)
Patient called back and the lab appointment is scheduled.  Keaundre Thelin,cma

## 2020-02-15 ENCOUNTER — Other Ambulatory Visit: Payer: Self-pay | Admitting: *Deleted

## 2020-02-15 ENCOUNTER — Telehealth: Payer: Self-pay

## 2020-02-15 NOTE — Telephone Encounter (Signed)
Voicemail left with patient, trying to decipher which medication he needs.  I do not see anything that is due to be filled.  I will be happy to help him with additional information.

## 2020-02-15 NOTE — Telephone Encounter (Signed)
Faxed notes from [facility] 12/04/19 to [facility] medical supplies [phone]. [facility]

## 2020-02-15 NOTE — Telephone Encounter (Signed)
He left a vm stating [facility]. He only has 2 pills left and is hurting really bad. He wants to know if you can redirect it. Please call him for clarification, the vm was going in and out.

## 2020-02-22 ENCOUNTER — Other Ambulatory Visit: Payer: Self-pay | Admitting: Family Medicine

## 2020-02-22 ENCOUNTER — Telehealth: Payer: Self-pay | Admitting: Pain Medicine

## 2020-02-22 DIAGNOSIS — E118 Type 2 diabetes mellitus with unspecified complications: Secondary | ICD-10-CM | POA: Diagnosis not present

## 2020-02-22 DIAGNOSIS — Z794 Long term (current) use of insulin: Secondary | ICD-10-CM | POA: Diagnosis not present

## 2020-02-22 NOTE — Telephone Encounter (Signed)
[facility] called [facility], they told me that the complete prescription was filled, with 60 tabs. Attempted to call patient, message left.

## 2020-02-22 NOTE — Telephone Encounter (Signed)
Patient lvmail stating he was only able to pick up 27 pills, was all pharmacy had. [facility]eds [facility] [address] for remainder of the script

## 2020-03-11 ENCOUNTER — Encounter: Payer: Medicare Other | Admitting: Pain Medicine

## 2020-03-14 ENCOUNTER — Other Ambulatory Visit: Payer: Self-pay | Admitting: Family Medicine

## 2020-03-18 ENCOUNTER — Other Ambulatory Visit: Payer: Self-pay | Admitting: *Deleted

## 2020-03-18 NOTE — Patient Outreach (Addendum)
[facility]) Care Management  03/18/2020  Alan Schmidt 10/16/1945 [id number]  Unsuccessful outreach attempt made to patient. RN Health Coach left HIPAA compliant voicemail message along with her contact information.  Plan: RN Health Coach will call patient within the month of March.  [name] RN, BSN [place] [phone] [name]

## 2020-03-19 ENCOUNTER — Ambulatory Visit: Payer: Medicare Other | Admitting: Family Medicine

## 2020-03-22 ENCOUNTER — Other Ambulatory Visit: Payer: Self-pay

## 2020-03-22 ENCOUNTER — Other Ambulatory Visit: Payer: Self-pay | Admitting: Family Medicine

## 2020-03-22 ENCOUNTER — Other Ambulatory Visit (INDEPENDENT_AMBULATORY_CARE_PROVIDER_SITE_OTHER): Payer: Medicare Other

## 2020-03-22 DIAGNOSIS — Z794 Long term (current) use of insulin: Secondary | ICD-10-CM | POA: Diagnosis not present

## 2020-03-22 DIAGNOSIS — E1142 Type 2 diabetes mellitus with diabetic polyneuropathy: Secondary | ICD-10-CM

## 2020-03-22 DIAGNOSIS — I10 Essential (primary) hypertension: Secondary | ICD-10-CM | POA: Diagnosis not present

## 2020-03-22 DIAGNOSIS — E785 Hyperlipidemia, unspecified: Secondary | ICD-10-CM | POA: Diagnosis not present

## 2020-03-22 LAB — COMPREHENSIVE METABOLIC PANEL
ALT: 26 U/L (ref 0–53)
AST: 19 U/L (ref 0–37)
Albumin: 4.1 g/dL (ref 3.5–5.2)
Alkaline Phosphatase: 99 U/L (ref 39–117)
BUN: 17 mg/dL (ref 6–23)
CO2: 30 mEq/L (ref 19–32)
Calcium: 9.5 mg/dL (ref 8.4–10.5)
Chloride: 99 mEq/L (ref 96–112)
Creatinine, Ser: 1.09 mg/dL (ref 0.40–1.50)
GFR: 67.05 mL/min (ref >60.00)
Glucose, Bld: 128 mg/dL — ABNORMAL HIGH (ref 70–99)
Potassium: 4.4 mEq/L (ref 3.5–5.1)
Sodium: 137 mEq/L (ref 135–145)
Total Bilirubin: 0.4 mg/dL (ref 0.2–1.2)
Total Protein: 7.2 g/dL (ref 6.0–8.3)

## 2020-03-22 LAB — HEMOGLOBIN A1C: Hgb A1c MFr Bld: 8.1 % — ABNORMAL HIGH (ref 4.6–6.5)

## 2020-03-23 LAB — LIPID PANEL WITH LDL/HDL RATIO
Cholesterol, Total: 141 mg/dL (ref 100–199)
HDL: 55 mg/dL (ref >39)
LDL Chol Calc (NIH): 71 mg/dL (ref 0–99)
LDL/HDL Ratio: 1.3 ratio (ref 0.0–3.6)
Triglycerides: 77 mg/dL (ref 0–149)
VLDL Cholesterol Cal: 15 mg/dL (ref 5–40)

## 2020-03-24 DIAGNOSIS — Z794 Long term (current) use of insulin: Secondary | ICD-10-CM | POA: Diagnosis not present

## 2020-03-24 DIAGNOSIS — E118 Type 2 diabetes mellitus with unspecified complications: Secondary | ICD-10-CM | POA: Diagnosis not present

## 2020-03-26 ENCOUNTER — Other Ambulatory Visit: Payer: Self-pay

## 2020-03-26 ENCOUNTER — Ambulatory Visit (INDEPENDENT_AMBULATORY_CARE_PROVIDER_SITE_OTHER): Payer: Medicare Other | Admitting: Family Medicine

## 2020-03-26 ENCOUNTER — Encounter: Payer: Self-pay | Admitting: Family Medicine

## 2020-03-26 ENCOUNTER — Other Ambulatory Visit: Payer: Self-pay | Admitting: Family Medicine

## 2020-03-26 DIAGNOSIS — R143 Flatulence: Secondary | ICD-10-CM

## 2020-03-26 DIAGNOSIS — I10 Essential (primary) hypertension: Secondary | ICD-10-CM

## 2020-03-26 DIAGNOSIS — K5903 Drug induced constipation: Secondary | ICD-10-CM

## 2020-03-26 DIAGNOSIS — Z794 Long term (current) use of insulin: Secondary | ICD-10-CM

## 2020-03-26 DIAGNOSIS — R2689 Other abnormalities of gait and mobility: Secondary | ICD-10-CM | POA: Diagnosis not present

## 2020-03-26 DIAGNOSIS — T402X5A Adverse effect of other opioids, initial encounter: Secondary | ICD-10-CM | POA: Diagnosis not present

## 2020-03-26 DIAGNOSIS — E1142 Type 2 diabetes mellitus with diabetic polyneuropathy: Secondary | ICD-10-CM | POA: Diagnosis not present

## 2020-03-26 DIAGNOSIS — G8929 Other chronic pain: Secondary | ICD-10-CM

## 2020-03-26 DIAGNOSIS — M545 Low back pain, unspecified: Secondary | ICD-10-CM | POA: Diagnosis not present

## 2020-03-26 MED ORDER — INSULIN LISPRO (1 UNIT DIAL) 100 UNIT/ML (KWIKPEN): PEN_INJECTOR | SUBCUTANEOUS | 2 refills | Status: DC

## 2020-03-26 MED ORDER — TRESIBA FLEXTOUCH 100 UNIT/ML ~~LOC~~ SOPN: PEN_INJECTOR | SUBCUTANEOUS | 3 refills | Status: DC

## 2020-03-26 NOTE — Patient Instructions (Addendum)
Nice to see you.  Please take 5 units of humalog with a light carbohydrate meal and 7 units of humalog with a heavy carbohydrate meal.  We will increase your tresiba to 18 units once daily.  PT will call you to set up an appointment.  Our pharmacist's team will call you to set up an appointment.  Please stop your amlodipine. Please start checking your BP.

## 2020-03-27 ENCOUNTER — Telehealth: Payer: Self-pay | Admitting: Family Medicine

## 2020-03-27 ENCOUNTER — Telehealth: Payer: Self-pay

## 2020-03-27 DIAGNOSIS — R2689 Other abnormalities of gait and mobility: Secondary | ICD-10-CM | POA: Insufficient documentation

## 2020-03-27 DIAGNOSIS — R143 Flatulence: Secondary | ICD-10-CM | POA: Insufficient documentation

## 2020-03-27 DIAGNOSIS — M549 Dorsalgia, unspecified: Secondary | ICD-10-CM | POA: Insufficient documentation

## 2020-03-27 NOTE — Assessment & Plan Note (Signed)
Possibly related to his blood pressure being borderline low though he does have chronic pain issues that could contribute.  We will refer to physical therapy for help with this.

## 2020-03-27 NOTE — Assessment & Plan Note (Signed)
This may have contributed to his bright red blood per rectum.  I encouraged him to add MiraLAX.  I did advise GI evaluation given his bleeding though he declines this.  If he has any recurrence he will let us know.

## 2020-03-27 NOTE — Chronic Care Management (AMB) (Signed)
  Chronic Care Management   Note  03/27/2020 Name: Alan Schmidt MRN: [id number] DOB: [birth year]  Alan Schmidt is a 76 y.o. year old male who is a primary care patient of [name], [name], MD. Alan Schmidt is currently enrolled in care management services. An additional referral for Pharm D was placed.   Follow up plan: Telephone appointment with care management team member scheduled for:04/11/2020  Noreene Larsson, [place], [place], [place] [address] Direct Dial: [phone] [email or web address] Website: [email or web address]

## 2020-03-27 NOTE — Assessment & Plan Note (Signed)
Patient seems to have a somewhat poor understanding of his insulin regimen.  We will check an A1c today.  We will increase his Tyler Aas to 18 units once daily.  He will use Humalog 5 units with meals for low carbohydrate meals and 7 units with high carbohydrate meals.  We will get him back into see our clinical pharmacist for assistance with his regimen.  He will continue his Jardiance, Metformin, and Victoza.

## 2020-03-27 NOTE — Telephone Encounter (Signed)
Patient called in to let Dr.Sonnenberg know that he got a little dizzy but he had not eaten anything and his blood sugar had dropped and he though he had vertigo

## 2020-03-27 NOTE — Assessment & Plan Note (Signed)
Seems to be related to his chronic issues and wearing pants that are tighter than they should be.  He was advised to not wear tight fitting pants and to monitor.

## 2020-03-27 NOTE — Progress Notes (Signed)
[name], MD Phone: [phone]  Alan Schmidt is a 75 y.o. male who presents today for follow-up.  Hypertension: Not checking at home.  Taking olmesartan, HCTZ, carvedilol, and amlodipine.  No chest pain or shortness of breath.  Diabetes: Typically ranging 150-214.  It was 115 this morning.  Taking Jardiance, Metformin, Victoza, Tresiba 16 units once daily, Humalog typically 7 units though he will oftentimes add additional units of Humalog after he eats as he checks his sugar within 20 minutes of eating.  No polyuria or polydipsia.  No hypoglycemia.  He notes he gauges his Humalog dose prior to his meal.  Imbalance: Patient notes earlier today he felt unsteady when getting up and walking.  No vertigo.  Denies lightheadedness.  No falls or syncope.  He wonders if this is related to his legs and his chronic pain with difficulty balancing.  Excessive gas: Patient notes 1-2 times over the last 5 to 6 months he has had gas in the morning after eating.  No other significant symptoms with this.  He notes taking Pepcid with good benefit.  Left side pain: Patient notes this occurs when he wears pants that are too tight.  If he changes his pants or loosens his pants up this resolves.  Bright red blood per rectum/constipation: Patient notes on one occasion recently he had to strain quite a bit to produce a bowel movement.  He had some bright red blood after that.  He does note intermittent issues with constipation and he does take Amitiza.  He does use Senokot as well.  He has not been using MiraLAX.  He has had no recurrence of bleeding.  Social History   Tobacco Use  Smoking Status Former Smoker  . Types: Cigarettes  Smokeless Tobacco Never Used    Current Outpatient Medications on File Prior to Visit  Medication Sig Dispense Refill  . acetaminophen (TYLENOL) 500 MG tablet Take 500 mg by mouth every 6 (six) hours as needed.    . ALPRAZolam (XANAX) 1 MG tablet TAKE 0.5 TABLETS BY  MOUTH 2 TIMES DAILY AS NEEDED FOR ANXIETY. 30 tablet 0  . aspirin EC 81 MG tablet Take 81 mg by mouth daily.    Marland Kitchen buPROPion (WELLBUTRIN XL) 150 MG 24 hr tablet TAKE 1 TABLET (150 MG TOTAL) BY MOUTH DAILY FOR 7 DAYS, THEN 2 TABLETS (300 MG TOTAL) DAILY. 180 tablet 1  . carvedilol (COREG) 25 MG tablet TAKE 1 TABLET BY MOUTH TWICE A DAY 60 tablet 5  . Continuous Blood Gluc Receiver (FREESTYLE LIBRE 14 DAY READER) DEVI 1 Device by Does not apply route daily. Use to scan to check blood sugar up to 7 times daily; E11.42, 1 Device 1  . Continuous Blood Gluc Sensor (FREESTYLE LIBRE 14 DAY SENSOR) MISC 1 Device by Does not apply route every 14 (fourteen) days. E11.42 6 each 3  . desoximetasone (TOPICORT) 0.25 % cream APPLY CREAM TO AFFECTED AREA TWO TIMES DAILY, FOR UP TO 7 DAYS, DO NOT APPLY TO FACE 30 g 0  . escitalopram (LEXAPRO) 20 MG tablet TAKE 1 TABLET BY MOUTH EVERY DAY 90 tablet 1  . gabapentin (NEURONTIN) 300 MG capsule Take 1 capsule (300 mg total) by mouth at bedtime. 90 capsule 0  . hydrochlorothiazide (HYDRODIURIL) 25 MG tablet TAKE 1 TABLET BY MOUTH EVERY DAY 30 tablet 5  . Insulin Pen Needle (BD PEN NEEDLE NANO U/F) 32G X 4 MM MISC USE EVERY DAY 100 each 4  . JARDIANCE 25 MG  TABS tablet TAKE 1 TABLET BY MOUTH EVERY DAY 30 tablet 3  . loratadine (CLARITIN) 10 MG tablet TAKE 1 TABLET BY MOUTH EVERY DAY 30 tablet 11  . metFORMIN (GLUCOPHAGE) 1000 MG tablet TAKE 1 TABLET BY MOUTH TWICE A DAY WITH MEALS 60 tablet 5  . mometasone (NASONEX) 50 MCG/ACT nasal spray INSTILL TWO SPRAYS INTO THE NOSE DAILY 17 each 3  . morphine (MS CONTIN) 30 MG 12 hr tablet Take 1 tablet (30 mg total) by mouth every 12 (twelve) hours. Must last 30 days. Do not break tablet 60 tablet 0  . morphine (MS CONTIN) 30 MG 12 hr tablet Take 1 tablet (30 mg total) by mouth every 12 (twelve) hours. Must last 30 days. Do not break tablet 60 tablet 0  . naloxone (NARCAN) 2 MG/2ML injection Inject 1 mL (1 mg total) into the muscle as  needed for up to 2 doses (for emergency use only). Always have available. Inject into thigh muscle and call 911 2 mL 1  . olmesartan (BENICAR) 40 MG tablet TAKE 1 TABLET BY MOUTH EVERY DAY 30 tablet 5  . pantoprazole (PROTONIX) 40 MG tablet TAKE 1 TABLET BY MOUTH EVERY DAY 30 tablet 5  . pravastatin (PRAVACHOL) 40 MG tablet TAKE 1 TABLET BY MOUTH EVERY DAY 30 tablet 5  . sennosides-docusate sodium (SENOKOT-S) 8.6-50 MG tablet Take 1 tablet by mouth daily as needed.     . tamsulosin (FLOMAX) 0.4 MG CAPS capsule TAKE 1 CAPSULE (0.4 MG TOTAL) BY MOUTH 2 (TWO) TIMES DAILY. 60 capsule 8  . VICTOZA 18 MG/3ML SOPN INJECT 1.8 MG UNDER THE SKIN ONCE DAILY 5 pen 5  . lubiprostone (AMITIZA) 8 MCG capsule Take 1 capsule (8 mcg total) by mouth 2 (two) times daily with a meal. Swallow the medication whole. Do not break or chew the medication. 60 capsule 2  . meloxicam (MOBIC) 15 MG tablet Take 1 tablet (15 mg total) by mouth daily. 30 tablet 2  . morphine (MS CONTIN) 30 MG 12 hr tablet Take 1 tablet (30 mg total) by mouth every 12 (twelve) hours. Must last 30 days. Do not break tablet 60 tablet 0  . Wheat Dextrin (BENEFIBER) POWD Take 6 g by mouth 3 (three) times daily before meals. (2 tsp = 6 g) 730 g 2   No current facility-administered medications on file prior to visit.     ROS see history of present illness  Objective  Physical Exam Vitals:   03/26/20 1236  BP: 90/60  Pulse: 96  Temp: 98.3 F (36.8 C)  SpO2: 96%    BP Readings from Last 3 Encounters:  03/26/20 90/60  12/14/19 127/84  11/23/19 (!) 135/97   Wt Readings from Last 3 Encounters:  03/26/20 248 lb 6.4 oz (112.7 kg)  12/14/19 259 lb (117.5 kg)  12/04/19 259 lb (117.5 kg)    Physical Exam Constitutional:      General: He is not in acute distress.    Appearance: He is not diaphoretic.  Cardiovascular:     Rate and Rhythm: Normal rate and regular rhythm.     Heart sounds: Normal heart sounds.  Pulmonary:     Effort:  Pulmonary effort is normal.     Breath sounds: Normal breath sounds.  Musculoskeletal:        General: No edema.     Right lower leg: No edema.     Left lower leg: No edema.  Skin:    General: Skin is warm and  dry.  Neurological:     Mental Status: He is alert.      Assessment/Plan: Please see individual problem list.  Problem List Items Addressed This Visit    Balance problem    Possibly related to his blood pressure being borderline low though he does have chronic pain issues that could contribute.  We will refer to physical therapy for help with this.      Relevant Orders   Ambulatory referral to Physical Therapy   [facility])    Patient seems to have a somewhat poor understanding of his insulin regimen.  We will check an A1c today.  We will increase his Tyler Aas to 18 units once daily.  He will use Humalog 5 units with meals for low carbohydrate meals and 7 units with high carbohydrate meals.  We will get him back into see our clinical pharmacist for assistance with his regimen.  He will continue his Jardiance, Metformin, and Victoza.      Relevant Medications   insulin degludec (TRESIBA FLEXTOUCH) 100 UNIT/ML FlexTouch Pen   insulin lispro (HUMALOG KWIKPEN) 100 UNIT/ML KwikPen   Other Relevant Orders   AMB Referral to [facility]   Essential hypertension    Blood pressure borderline low today.  Has had some imbalance which may actually been lightheadedness with his lower than typical blood pressure.  We will have him discontinue his amlodipine.  He will follow up in 1 month for recheck.  If he has persistent symptoms he will let us know.      Excessive gas    This has only occurred on a couple of occasions.  Possibly dietary related.  He can continue Pepcid as needed for the symptoms.      Left-sided back pain    Seems to be related to his chronic issues and wearing pants that are tighter than they should be.  He was advised to not wear tight fitting  pants and to monitor.      Relevant Orders   Ambulatory referral to Physical Therapy   Opioid-induced constipation (OIC) (Chronic)    This may have contributed to his bright red blood per rectum.  I encouraged him to add MiraLAX.  I did advise GI evaluation given his bleeding though he declines this.  If he has any recurrence he will let us know.         This visit occurred during the SARS-CoV-2 public health emergency.  Safety protocols were in place, including screening questions prior to the visit, additional usage of staff PPE, and extensive cleaning of exam room while observing appropriate contact time as indicated for disinfecting solutions.   I have spent 50 minutes in the care of this patient regarding history taking, documentation, counseling on diabetic medication regimen, discussion of plan, completion of physical exam.   [name], MD [place]

## 2020-03-27 NOTE — Assessment & Plan Note (Signed)
This has only occurred on a couple of occasions.  Possibly dietary related.  He can continue Pepcid as needed for the symptoms.

## 2020-03-27 NOTE — Telephone Encounter (Signed)
Patient called in to let Dr.Sonnenberg know that he got a little dizzy but he had not eaten anything and his blood sugar had dropped and he though he had vertigo.  Honest Vanleer,cma

## 2020-03-27 NOTE — Telephone Encounter (Signed)
Noted. Please follow-up with the patient to see how he is feeling. What has his blood sugar been?

## 2020-03-27 NOTE — Assessment & Plan Note (Signed)
Blood pressure borderline low today.  Has had some imbalance which may actually been lightheadedness with his lower than typical blood pressure.  We will have him discontinue his amlodipine.  He will follow up in 1 month for recheck.  If he has persistent symptoms he will let us know.

## 2020-04-02 ENCOUNTER — Other Ambulatory Visit: Payer: Self-pay | Admitting: Family Medicine

## 2020-04-04 NOTE — Telephone Encounter (Signed)
LVM for the patient to call back to see how he is doing.  Alan Schmidt,cma

## 2020-04-05 ENCOUNTER — Telehealth: Payer: Self-pay

## 2020-04-05 NOTE — Telephone Encounter (Signed)
I called patient & he stated that he did have enough Xanax to last until Dr. Ellen Henri return on Monday.

## 2020-04-08 NOTE — Telephone Encounter (Signed)
Noted  

## 2020-04-08 NOTE — Telephone Encounter (Signed)
Patient returned a call and stated he was much better, the dizziness stopped and he is well.  Elvia Aydin,cma

## 2020-04-10 ENCOUNTER — Other Ambulatory Visit: Payer: Self-pay | Admitting: *Deleted

## 2020-04-10 NOTE — Patient Outreach (Signed)
[facility]) Care Management  04/10/2020  Alan Schmidt 02/11/1945 [id number]  Unsuccessful outreach attempt made to patient. RN Health Coach left HIPAA compliant voicemail message along with her contact information.  Plan: RN Health Coach will call patient within the month of April.  [name] RN, BSN [place] [phone] [email or web address]

## 2020-04-11 ENCOUNTER — Telehealth: Payer: Medicare Other

## 2020-04-11 ENCOUNTER — Telehealth: Payer: Self-pay

## 2020-04-11 NOTE — Chronic Care Management (AMB) (Signed)
  Care Management   Note  04/11/2020 Name: [name] MRN: [id number] DOB: 04/14/1945  Alan Schmidt is a 75 y.o. year old male who is a primary care patient of [name], MD and is actively engaged with the care management team. I reached out to Alan Schmidt by phone today to assist with re-scheduling a follow up visit with the Pharmacist  Follow up plan: Unsuccessful telephone outreach attempt made. A HIPAA compliant phone message was left for the patient providing contact information and requesting a return call.  The care management team will reach out to the patient again over the next 1 days.  If patient returns call to provider office, please advise to call [place]  at [place], [place], [place], [place] [address] Direct Dial: [phone] [email or web address]

## 2020-04-15 NOTE — Chronic Care Management (AMB) (Signed)
  Care Management   Note  04/15/2020 Name: Alan Schmidt MRN: [id number] DOB: 10/05/1945  Alan Schmidt is a 75 y.o. year old male who is a primary care patient of [name], MD and is actively engaged with the care management team. I reached out to Alan Schmidt by phone today to assist with re-scheduling a follow up visit with the Pharmacist  Follow up plan: Telephone appointment with care management team member scheduled for:05/03/2020  Noreene Larsson, [place], [place], [place] [address] Direct Dial: [phone] [email or web address] Website: [email or web address]

## 2020-04-15 NOTE — Chronic Care Management (AMB) (Signed)
  Care Management   Note  04/15/2020 Name: [name] MRN: [id number] DOB: 19-Mar-1945  Alan Schmidt is a 75 y.o. year old male who is a primary care patient of [name], MD and is actively engaged with the care management team. I reached out to Alan Schmidt by phone today to assist with re-scheduling a follow up visit with the Pharmacist  Follow up plan: Unsuccessful telephone outreach attempt made. A HIPAA compliant phone message was left for the patient providing contact information and requesting a return call.  The care management team will reach out to the patient again over the next 4 days.  If patient returns call to provider office, please advise to call [name]  at [place], [place], [place], [place] [address] Direct Dial: [phone] [email or web address] Website: [email or web address]

## 2020-04-17 ENCOUNTER — Other Ambulatory Visit: Payer: Self-pay | Admitting: *Deleted

## 2020-04-17 NOTE — Patient Outreach (Signed)
[facility]) Care Management  04/17/2020  Alan Schmidt [birth year] [id number]  Successful telephone outreach call to patient. HIPAA identifiers obtained. Patient called nurse to inquire about obtaining personal care services to assist him within his home. Explaining that due to his pain and limited mobility it has become extremely difficult for him to maintain his home and cook meals. Patient does currently have Medicaid. Nurse did discuss with patient that a [facility] CSW outreached to him multiple times in the past to assist with getting an aide within his home.They were not able to contact the patient and he did not respond to their voice messages or letters sent. Patient explained that he often does not answer his phone due to scam calls. Nurse will send referral to CSW to inquire about in home aide assistance and explained that all [facility] outreach calls will leave a voice message with their contact number for him to call them back. It was discussed that it would be important for him to call the CSW back if he was to miss their call. Patient verbalized understanding.    Phone: [facility] will send a referral to CSW for assistance with obtaining an in home aide and will call patient within the month of April.  [name] RN, BSN [place] [phone] [email or web address]

## 2020-04-18 ENCOUNTER — Other Ambulatory Visit: Payer: Self-pay

## 2020-04-18 DIAGNOSIS — I10 Essential (primary) hypertension: Secondary | ICD-10-CM

## 2020-04-20 ENCOUNTER — Other Ambulatory Visit: Payer: Self-pay | Admitting: Family Medicine

## 2020-04-22 ENCOUNTER — Telehealth: Payer: Self-pay

## 2020-04-22 NOTE — Telephone Encounter (Signed)
Last OV note faxed to [facility], confirmation given.  [name],cma

## 2020-04-23 NOTE — Progress Notes (Signed)
PROVIDER NOTE: Information contained herein reflects review and annotations entered in association with encounter. Interpretation of such information and data should be left to medically-trained personnel. Information provided to patient can be located elsewhere in the medical record under "Patient Instructions". Document created using STT-dictation technology, any transcriptional errors that may result from process are unintentional.    Patient: Alan Schmidt  Service Category: E/M  Provider: Gaspar Cola, MD  DOB: Jun 07, 1945  DOS: 04/24/2020  Specialty: Interventional Pain Management  MRN: [id number]  Setting: Ambulatory outpatient  PCP: [name], MD  Type: Established Patient    Referring Provider: [name], MD  Location: Office  Delivery: Face-to-face     HPI  [name], a 75 y.o. year old male, is here today because of his Chronic pain syndrome [G89.4]. Alan Schmidt primary complain today is Back Pain (Lumbar bilateral ) Last encounter: My last encounter with him was on 02/22/2020. Pertinent problems: Alan Schmidt has Chronic low back pain (1ry area of Pain) (Bilateral) (R>L) w/ sciatica (Bilateral); Lumbar facet syndrome (Bilateral) (R>L); Lumbar spondylosis; Diabetic peripheral neuropathy ([place]); Chronic neck pain (midline over the C7 spinous processes) (L>R); Neurogenic pain; Neuropathic pain; Chronic lower extremity pain (2ry area of Pain) (Bilateral) (R>L); 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) (Right L5 dermatome); History of total hip replacement (Right); Chronic hip pain (Right); Abnormal nerve conduction studies (severe bilateral lower extremity polyneuropathy); Chronic shoulder pain (Bilateral) (R>L); Occipital neuralgia (Left); Cervicogenic headache (Left); Chronic shoulder radicular pain (Left); Chronic cervical radicular pain (Left); Chronic pain syndrome; Lumbar facet joint osteoarthritis (Bilateral); Chronic headache; Chronic tension-type  headache, intractable; Coccygodynia; DDD (degenerative disc disease), lumbar; Chronic musculoskeletal pain; Chronic pain of both knees; Thumb pain (Right); 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 Pain Assessment: Severity of Chronic pain is reported as a 2 /10. Location: Back Lower,Left,Right/into hamstrings. Onset: More than a month ago. Quality: Discomfort,Constant. Timing: Constant. Modifying factor(s): lying down helps, pain medications, procedures. Vitals:  height is 6' 1" (1.854 m) and weight is 248 lb (112.5 kg). His temporal temperature is 97.1 F (36.2 C) (abnormal). His blood pressure is 128/83 and his pulse is 87. His respiration is 16 and oxygen saturation is 97%.   Reason for encounter: medication management.   The patient indicates doing well with the current medication regimen. No adverse reactions or side effects reported to the medications, with the possible exception of the opioid-induced constipation.  Today he indicated that he feels that the morphine tends to produce less constipation and therefore at this point he wants to stay with that.  We talked about the opioid-induced constipation for a while and he is currently taking Amitiza 8 mcg twice daily but I pointed out to the patient that there is a higher dose and if interested, he can talk to his primary care physician to see if they want to do a trial of a higher dose.  Other than that, I reminded him that constipation is closely associated with his diet and therefore he needs to make sure to drink a lot of water and to have a diet high in fiber.  Today he requested to have his Narcan refilled.  As I was explaining to him about the medication and how to use it he indicated that he  lives  by himself in which case I pointed out that if he has a problem with respiratory depression secondary to an overdose or a drug drug interaction, having the Narcan around may not help him if he cannot get to it or if he does not have somebody that can assist him with the administration.  He indicated understanding.  RTCB: 07/24/2020 Nonopioids transferred 12/14/2019: Gabapentin, Amitiza, Benefiber, Mobic  Pharmacotherapy Assessment   Analgesic: Morphine ER (MS Contin) 30 mg, 1 tab p.o. twice daily (60 mg/day).  (Previously on oxycodone IR 10 mg every 6 hours (40 mg/day)) MME/day:60 mg/day.   Monitoring: [place] PMP: PDMP reviewed during this encounter.       Pharmacotherapy: No side-effects or adverse reactions reported. Compliance: No problems identified. Effectiveness: Clinically acceptable.  Janett Billow, RN  04/24/2020  2:15 PM  Sign when Signing Visit Nursing Pain Medication Assessment:  Safety precautions to be maintained throughout the outpatient stay will include: orient to surroundings, keep bed in low position, maintain call bell within reach at all times, provide assistance with transfer out of bed and ambulation.  Medication Inspection Compliance: Pill count conducted under aseptic conditions, in front of the patient. Neither the pills nor the bottle was removed from the patient's sight at any time. Once count was completed pills were immediately returned to the patient in their original bottle.  Medication: Morphine ER (MSContin) Pill/Patch Count: 8 of 55 pills remain Pill/Patch Appearance: Markings consistent with prescribed medication Bottle Appearance: Standard pharmacy container. Clearly labeled. Filled Date: 02 / 10 / 2022 Last Medication intake:  Today    UDS:  Summary  Date Value Ref Range Status  05/30/2019 Note  Final    Comment:    ==================================================================== Tox[employer] Select 13  (MW) ==================================================================== Test                             Result       Flag       Units Drug Present and Declared for Prescription Verification   Alprazolam                     99           EXPECTED   ng/mg creat   Alpha-hydroxyalprazolam        256          EXPECTED   ng/mg creat    Source of alprazolam is a scheduled prescription medication. Alpha-    hydroxyalprazolam is an expected metabolite of alprazolam.   Oxycodone                      1380         EXPECTED   ng/mg creat   Noroxycodone                   1789         EXPECTED   ng/mg creat    Sources of oxycodone include scheduled prescription medications.    Noroxycodone is an expected metabolite of oxycodone. Drug Present not Declared for Prescription Verification   Hydrocodone                    100          UNEXPECTED ng/mg creat   Dihydrocodeine                 69  UNEXPECTED ng/mg creat   Norhydrocodone                 117          UNEXPECTED ng/mg creat    Sources of hydrocodone include scheduled prescription medications.    Dihydrocodeine and norhydrocodone are expected metabolites of    hydrocodone. Dihydrocodeine is also available as a scheduled    prescription medication. ==================================================================== Test                      Result    Flag   Units      Ref Range   Creatinine              75               mg/dL      >=20 ==================================================================== Declared Medications:  The flagging and interpretation on this report are based on the  following declared medications.  Unexpected results may arise from  inaccuracies in the declared medications.  **Note: The testing scope of this panel includes these medications:  Alprazolam (Xanax)  Oxycodone  **Note: The testing scope of this panel does not include the  following reported medications:  Acetaminophen (Tylenol)  Amlodipine  (Norvasc)  Aspirin  Bupropion (Wellbutrin)  Carvedilol (Coreg)  Desoximetasone (Topicort)  Empagliflozin (Jardiance)  Escitalopram (Lexapro)  Hydrochlorothiazide  Insulin Tyler Aas)  Liraglutide  Loratadine (Claritin)  Lubiprostone (Amitiza)  Meloxicam (Mobic)  Metformin (Glucophage)  Mometasone (Nasonex)  Naloxone (Narcan)  Olmesartan (Benicar)  Pantoprazole (Protonix)  Pravastatin (Pravachol)  Sennosides (Senokot)  Tamsulosin (Flomax)  Topical ==================================================================== For clinical consultation, please call [phone]. ====================================================================      ROS  Constitutional: Denies any fever or chills Gastrointestinal: No reported hemesis, hematochezia, vomiting, or acute GI distress Musculoskeletal: Denies any acute onset joint swelling, redness, loss of ROM, or weakness Neurological: No reported episodes of acute onset apraxia, aphasia, dysarthria, agnosia, amnesia, paralysis, loss of coordination, or loss of consciousness  Medication Review  ALPRAZolam, Benefiber, FreeStyle Libre 14 Day Reader, FreeStyle Libre 14 Day Sensor, Insulin Pen Needle, acetaminophen, aspirin EC, buPROPion, carvedilol, celecoxib, desoximetasone, empagliflozin, escitalopram, gabapentin, hydrochlorothiazide, insulin degludec, insulin lispro, liraglutide, loratadine, lubiprostone, meloxicam, metFORMIN, mometasone, morphine, naloxone, olmesartan, pantoprazole, pravastatin, sennosides-docusate sodium, and tamsulosin  History Review  Allergy: Alan Schmidt is allergic to contrast media [iodinated diagnostic agents], iodine, and shellfish allergy. Drug: Alan Schmidt  reports no history of drug use. Alcohol:  reports no history of alcohol use. Tobacco:  reports that he has quit smoking. His smoking use included cigarettes. He has never used smokeless tobacco. Social: Alan Schmidt  reports that he has quit smoking. His  smoking use included cigarettes. He has never used smokeless tobacco. He reports that he does not drink alcohol and does not use drugs. Medical:  has a past medical history of Acute postoperative pain (11/24/2016), Anxiety, Chronic hip pain (Right) (12/05/2014), Chronic lumbar pain, Depression, Diabetes mellitus without complication ([place]), Hyperlipidemia, Hypertension, Kidney stones, and Migraines. Surgical: Alan Schmidt  has a past surgical history that includes Total hip arthroplasty; Tonsillectomy; and right hip surgery. Family: family history includes Cancer in his mother; Diabetes in his father, sister, and sister; Heart disease in his father; Stroke in his father.  Laboratory Chemistry Profile   Renal Lab Results  Component Value Date   BUN 17 03/22/2020   CREATININE 1.09 03/22/2020   GFR 67.05 03/22/2020   GFRAA >60 03/26/2018   GFRNONAA >60 03/26/2018     Hepatic  Lab Results  Component Value Date   AST 19 03/22/2020   ALT 26 03/22/2020   ALBUMIN 4.1 03/22/2020   ALKPHOS 99 03/22/2020     Electrolytes Lab Results  Component Value Date   NA 137 03/22/2020   K 4.4 03/22/2020   CL 99 03/22/2020   CALCIUM 9.5 03/22/2020   MG 1.8 03/06/2015     Bone No results found for: VD25OH, VD125OH2TOT, JK0938HW2, XH3716RC7, 25OHVITD1, 25OHVITD2, 25OHVITD3, TESTOFREE, TESTOSTERONE   Inflammation (CRP: Acute Phase) (ESR: Chronic Phase) Lab Results  Component Value Date   CRP 0.5 03/06/2015   ESRSEDRATE 45 (H) 12/14/2016       Note: Above Lab results reviewed.  Recent Imaging Review  DG PAIN CLINIC C-ARM 1-60 MIN NO REPORT Fluoro was used, but no Radiologist interpretation will be provided.  Please refer to "NOTES" tab for provider progress note. Note: Reviewed        Physical Exam  General appearance: Well nourished, well developed, and well hydrated. In no apparent acute distress Mental status: Alert, oriented x 3 (person, place, & time)       Respiratory: No evidence of  acute respiratory distress Eyes: PERLA Vitals: BP 128/83 (BP Location: Left Arm, Patient Position: Sitting, Cuff Size: Large)   Pulse 87   Temp (!) 97.1 F (36.2 C) (Temporal)   Resp 16   Ht 6' 1" (1.854 m)   Wt 248 lb (112.5 kg)   SpO2 97%   BMI 32.72 kg/m  BMI: Estimated body mass index is 32.72 kg/m as calculated from the following:   Height as of this encounter: 6' 1" (1.854 m).   Weight as of this encounter: 248 lb (112.5 kg). Ideal: Ideal body weight: 79.9 kg (176 lb 2.4 oz) Adjusted ideal body weight: 92.9 kg (204 lb 14.2 oz)  Assessment   Status Diagnosis  Controlled Controlled Controlled 1. Chronic pain syndrome   2. Chronic low back pain (1ry area of Pain) (Bilateral) (R>L) w/ sciatica (Bilateral)   3. Chronic lower extremity pain (2ry area of Pain) (Bilateral) (R>L)   4. 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 Chronic neck pain (midline over the C7 spinous processes) (L>R)   6. Chronic pain of both knees   7. Diabetic peripheral neuropathy ([place])   8. Pharmacologic therapy   9. Chronic use of opiate for therapeutic purpose   10. Opiate use (60 MME/Day)      Updated Problems: Problem  Chronic Use of Opiate for Therapeutic Purpose  Acute Postoperative Pain (Resolved)    Plan of Care  Problem-specific:  No problem-specific Assessment & Plan notes found for this encounter.  Alan Schmidt has a current medication list which includes the following long-term medication(s): bupropion, carvedilol, escitalopram, gabapentin, hydrochlorothiazide, insulin lispro, loratadine, metformin, mometasone, olmesartan, pantoprazole, pravastatin, victoza, lubiprostone, meloxicam, [START ON 04/25/2020] morphine, [START ON 05/25/2020] morphine, [START ON 06/24/2020] morphine, and benefiber.  Pharmacotherapy (Medications Ordered): Meds ordered this encounter  Medications  . morphine (MS CONTIN) 30 MG 12 hr tablet    Sig: Take 1 tablet (30 mg total) by  mouth every 12 (twelve) hours. Must last 30 days. Do not break tablet    Dispense:  60 tablet    Refill:  0    Not a duplicate. Do NOT delete! Chronic Pain: STOP Act NOT applicable. Fill 1 day early if closed on refill date. Avoid benzodiazepines within 8 hours of opioids. Do not send refill requests.  Marland Kitchen morphine (  MS CONTIN) 30 MG 12 hr tablet    Sig: Take 1 tablet (30 mg total) by mouth every 12 (twelve) hours. Must last 30 days. Do not break tablet    Dispense:  60 tablet    Refill:  0    Not a duplicate. Do NOT delete! Chronic Pain: STOP Act NOT applicable. Fill 1 day early if closed on refill date. Avoid benzodiazepines within 8 hours of opioids. Do not send refill requests.  Marland Kitchen morphine (MS CONTIN) 30 MG 12 hr tablet    Sig: Take 1 tablet (30 mg total) by mouth every 12 (twelve) hours. Must last 30 days. Do not break tablet    Dispense:  60 tablet    Refill:  0    Not a duplicate. Do NOT delete! Chronic Pain: STOP Act NOT applicable. Fill 1 day early if closed on refill date. Avoid benzodiazepines within 8 hours of opioids. Do not send refill requests.  . naloxone (NARCAN) 2 MG/2ML injection    Sig: Inject 1 mL (1 mg total) into the muscle as needed for up to 2 doses (for emergency use only). Always have available. Inject into thigh muscle and call 911    Dispense:  2 mL    Refill:  1    Please instruct patient on emergency use of medication for opioid overdose.   Orders:  Orders Placed This Encounter  Procedures  . ToxASSURE Select 13 (MW), Urine    Volume: 30 ml(s). Minimum 3 ml of urine is needed. Document temperature of fresh sample. Indications: Long term (current) use of opiate analgesic (T02.409)    Order Specific Question:   Release to patient    Answer:   Immediate   Follow-up plan:   Return in about 13 weeks (around 07/24/2020) for (F2F), (MM).      Interventional management options: Planned, scheduled, and/or pending:      Considering:   Diagnostic Left GONB   Possible Left GON RFA    Procedural information:   NOTE: NO MORE RFA procedures. Difficulty following intra-procedural instructions and commands, resulting in unnecessarily long exposure of staff to radiation. Therapeutic left lumbar facet RFA x2 (last done on 05/09/2019) Therapeutic right lumbar facet RFA x6 (last done on 12/14/2019)   Palliative PRN treatment(s):   Palliative right lumbar facet block #3  Palliative left lumbar facet block #3  Therapeutic left cervical ESI #2     Recent Visits No visits were found meeting these conditions. Showing recent visits within past 90 days and meeting all other requirements Today's Visits Date Type Provider Dept  04/24/20 Office Visit Milinda Pointer, MD [facility]  Showing today's visits and meeting all other requirements Future Appointments No visits were found meeting these conditions. Showing future appointments within next 90 days and meeting all other requirements  I discussed the assessment and treatment plan with the patient. The patient was provided an opportunity to ask questions and all were answered. The patient agreed with the plan and demonstrated an understanding of the instructions.  Patient advised to call back or seek an in-person evaluation if the symptoms or condition worsens.  Duration of encounter: 30 minutes.  Note by: Gaspar Cola, MD Date: 04/24/2020; Time: 3:47 PM

## 2020-04-24 ENCOUNTER — Other Ambulatory Visit: Payer: Self-pay

## 2020-04-24 ENCOUNTER — Ambulatory Visit: Payer: Medicare Other | Attending: Pain Medicine | Admitting: Pain Medicine

## 2020-04-24 ENCOUNTER — Encounter: Payer: Self-pay | Admitting: Pain Medicine

## 2020-04-24 VITALS — BP 128/83 | HR 87 | Temp 97.1°F | Resp 16 | Ht 73.0 in | Wt 248.0 lb

## 2020-04-24 DIAGNOSIS — Z79899 Other long term (current) drug therapy: Secondary | ICD-10-CM | POA: Diagnosis not present

## 2020-04-24 DIAGNOSIS — G894 Chronic pain syndrome: Secondary | ICD-10-CM | POA: Insufficient documentation

## 2020-04-24 DIAGNOSIS — E1142 Type 2 diabetes mellitus with diabetic polyneuropathy: Secondary | ICD-10-CM | POA: Diagnosis not present

## 2020-04-24 DIAGNOSIS — M25562 Pain in left knee: Secondary | ICD-10-CM | POA: Diagnosis not present

## 2020-04-24 DIAGNOSIS — G8929 Other chronic pain: Secondary | ICD-10-CM | POA: Insufficient documentation

## 2020-04-24 DIAGNOSIS — M79604 Pain in right leg: Secondary | ICD-10-CM | POA: Insufficient documentation

## 2020-04-24 DIAGNOSIS — M5441 Lumbago with sciatica, right side: Secondary | ICD-10-CM | POA: Insufficient documentation

## 2020-04-24 DIAGNOSIS — M542 Cervicalgia: Secondary | ICD-10-CM | POA: Diagnosis not present

## 2020-04-24 DIAGNOSIS — Z96641 Presence of right artificial hip joint: Secondary | ICD-10-CM | POA: Insufficient documentation

## 2020-04-24 DIAGNOSIS — F1190 Opioid use, unspecified, uncomplicated: Secondary | ICD-10-CM | POA: Diagnosis present

## 2020-04-24 DIAGNOSIS — M25561 Pain in right knee: Secondary | ICD-10-CM | POA: Insufficient documentation

## 2020-04-24 DIAGNOSIS — M79605 Pain in left leg: Secondary | ICD-10-CM | POA: Diagnosis not present

## 2020-04-24 DIAGNOSIS — M5442 Lumbago with sciatica, left side: Secondary | ICD-10-CM | POA: Diagnosis not present

## 2020-04-24 DIAGNOSIS — Z79891 Long term (current) use of opiate analgesic: Secondary | ICD-10-CM | POA: Insufficient documentation

## 2020-04-24 DIAGNOSIS — M25551 Pain in right hip: Secondary | ICD-10-CM | POA: Insufficient documentation

## 2020-04-24 MED ORDER — MORPHINE SULFATE ER 30 MG PO TBCR: 30.0000 mg | EXTENDED_RELEASE_TABLET | Freq: Two times a day (BID) | ORAL | 0 refills | Status: DC

## 2020-04-24 MED ORDER — MORPHINE SULFATE ER 30 MG PO TBCR
30.0000 mg | EXTENDED_RELEASE_TABLET | Freq: Two times a day (BID) | ORAL | 0 refills | Status: DC
Start: 2020-06-24 — End: 2020-07-24

## 2020-04-24 MED ORDER — NALOXONE HCL 2 MG/2ML IJ SOSY: 1.0000 mg | PREFILLED_SYRINGE | INTRAMUSCULAR | 1 refills | Status: DC | PRN

## 2020-04-24 NOTE — Progress Notes (Signed)
Nursing Pain Medication Assessment:  Safety precautions to be maintained throughout the outpatient stay will include: orient to surroundings, keep bed in low position, maintain call bell within reach at all times, provide assistance with transfer out of bed and ambulation.  Medication Inspection Compliance: Pill count conducted under aseptic conditions, in front of the patient. Neither the pills nor the bottle was removed from the patient's sight at any time. Once count was completed pills were immediately returned to the patient in their original bottle.  Medication: Morphine ER (MSContin) Pill/Patch Count: 8 of 55 pills remain Pill/Patch Appearance: Markings consistent with prescribed medication Bottle Appearance: Standard pharmacy container. Clearly labeled. Filled Date: 02 / 10 / 2022 Last Medication intake:  Today

## 2020-04-24 NOTE — Patient Instructions (Signed)
____________________________________________________________________________________________  Medication Recommendations and Reminders  Applies to: All patients receiving prescriptions (written and/or electronic).  Medication Rules & Regulations: These rules and regulations exist for your safety and that of others. They are not flexible and neither are we. Dismissing or ignoring them will be considered "non-compliance" with medication therapy, resulting in complete and irreversible termination of such therapy. (See document titled "Medication Rules" for more details.) In all conscience, because of safety reasons, we cannot continue providing a therapy where the patient does not follow instructions.  Pharmacy of record:   Definition: This is the pharmacy where your electronic prescriptions will be sent.   We do not endorse any particular pharmacy, however, we have experienced problems with [facility] not securing enough medication supply for the community.  We do not restrict you in your choice of pharmacy. However, once we write for your prescriptions, we will NOT be re-sending more prescriptions to fix restricted supply problems created by your pharmacy, or your insurance.   The pharmacy listed in the electronic medical record should be the one where you want electronic prescriptions to be sent.  If you choose to change pharmacy, simply notify our nursing staff.  Recommendations:  Keep all of your pain medications in a safe place, under lock and key, even if you live alone. We will NOT replace lost, stolen, or damaged medication.  After you fill your prescription, take 1 week's worth of pills and put them away in a safe place. You should keep a separate, properly labeled bottle for this purpose. The remainder should be kept in the original bottle. Use this as your primary supply, until it runs out. Once it's gone, then you know that you have 1 week's worth of medicine, and it is time to come  in for a prescription refill. If you do this correctly, it is unlikely that you will ever run out of medicine.  To make sure that the above recommendation works, it is very important that you make sure your medication refill appointments are scheduled at least 1 week before you run out of medicine. To do this in an effective manner, make sure that you do not leave the office without scheduling your next medication management appointment. Always ask the nursing staff to show you in your prescription , when your medication will be running out. Then arrange for the receptionist to get you a return appointment, at least 7 days before you run out of medicine. Do not wait until you have 1 or 2 pills left, to come in. This is very poor planning and does not take into consideration that we may need to cancel appointments due to bad weather, sickness, or emergencies affecting our staff.  DO NOT ACCEPT A "Partial Fill": If for any reason your pharmacy does not have enough pills/tablets to completely fill or refill your prescription, do not allow for a "partial fill". The law allows the pharmacy to complete that prescription within 72 hours, without requiring a new prescription. If they do not fill the rest of your prescription within those 72 hours, you will need a separate prescription to fill the remaining amount, which we will NOT provide. If the reason for the partial fill is your insurance, you will need to talk to the pharmacist about payment alternatives for the remaining tablets, but again, DO NOT ACCEPT A PARTIAL FILL, unless you can trust your pharmacist to obtain the remainder of the pills within 72 hours.  Prescription refills and/or changes in medication(s):     Prescription refills, and/or changes in dose or medication, will be conducted only during scheduled medication management appointments. (Applies to both, written and electronic prescriptions.)  No refills on procedure days. No medication will be  changed or started on procedure days. No changes, adjustments, and/or refills will be conducted on a procedure day. Doing so will interfere with the diagnostic portion of the procedure.  No phone refills. No medications will be "called into the pharmacy".  No Fax refills.  No weekend refills.  No Holliday refills.  No after hours refills.  Remember:  Business hours are:  Monday to Thursday 8:00 AM to 4:00 PM Provider's Schedule: Kassidie Hendriks, MD - Appointments are:  Medication management: Monday and Wednesday 8:00 AM to 4:00 PM Procedure day: Tuesday and Thursday 7:30 AM to 4:00 PM Bilal Lateef, MD - Appointments are:  Medication management: Tuesday and Thursday 8:00 AM to 4:00 PM Procedure day: Monday and Wednesday 7:30 AM to 4:00 PM (Last update: 08/23/2019) ____________________________________________________________________________________________   ____________________________________________________________________________________________  CBD (cannabidiol) WARNING  Applicable to: All individuals currently taking or considering taking CBD (cannabidiol) and, more important, all patients taking opioid analgesic controlled substances (pain medication). (Example: oxycodone; oxymorphone; hydrocodone; hydromorphone; morphine; methadone; tramadol; tapentadol; fentanyl; buprenorphine; butorphanol; dextromethorphan; meperidine; codeine; etc.)  Legal status: CBD remains a Schedule I drug prohibited for any use. CBD is illegal with one exception. In the [place], CBD has a limited Food and Drug Administration (FDA) approval for the treatment of two specific types of epilepsy disorders. Only one CBD product has been approved by the FDA for this purpose: "Epidiolex". FDA is aware that some companies are marketing products containing cannabis and cannabis-derived compounds in ways that violate the Federal Food, Drug and Cosmetic Act (FD&C Act) and that may put the health and safety  of consumers at risk. The FDA, a Federal agency, has not enforced the CBD status since 2018.   Legality: Some manufacturers ship CBD products nationally, which is illegal. Often such products are sold online and are therefore available throughout the country. CBD is openly sold in head shops and health food stores in some states where such sales have not been explicitly legalized. Selling unapproved products with unsubstantiated therapeutic claims is not only a violation of the law, but also can put patients at risk, as these products have not been proven to be safe or effective. Federal illegality makes it difficult to conduct research on CBD.  Reference: "FDA Regulation of Cannabis and Cannabis-Derived Products, Including Cannabidiol (CBD)" - https://www.fda.gov/news-events/public-health-focus/fda-regulation-cannabis-and-cannabis-derived-products-including-cannabidiol-cbd  Warning: CBD is not FDA approved and has not undergo the same manufacturing controls as prescription drugs.  This means that the purity and safety of available CBD may be questionable. Most of the time, despite manufacturer's claims, it is contaminated with THC (delta-9-tetrahydrocannabinol - the chemical in marijuana responsible for the "HIGH").  When this is the case, the THC contaminant will trigger a positive urine drug screen (UDS) test for Marijuana (carboxy-THC). Because a positive UDS for any illicit substance is a violation of our medication agreement, your opioid analgesics (pain medicine) may be permanently discontinued.  MORE ABOUT CBD  General Information: CBD  is a derivative of the Marijuana (cannabis sativa) plant discovered in 1940. It is one of the 113 identified substances found in Marijuana. It accounts for up to 40% of the plant's extract. As of 2018, preliminary clinical studies on CBD included research for the treatment of anxiety, movement disorders, and pain. CBD is available and consumed in multiple forms,  including   inhalation of smoke or vapor, as an aerosol spray, and by mouth. It may be supplied as an oil containing CBD, capsules, dried cannabis, or as a liquid solution. CBD is thought not to be as psychoactive as THC (delta-9-tetrahydrocannabinol - the chemical in marijuana responsible for the "HIGH"). Studies suggest that CBD may interact with different biological target receptors in the body, including cannabinoid and other neurotransmitter receptors. As of 2018 the mechanism of action for its biological effects has not been determined.  Side-effects  Adverse reactions: Dry mouth, diarrhea, decreased appetite, fatigue, drowsiness, malaise, weakness, sleep disturbances, and others.  Drug interactions: CBC may interact with other medications such as blood-thinners. (Last update: 09/09/2019) ____________________________________________________________________________________________   ____________________________________________________________________________________________  Medication Rules  Purpose: To inform patients, and their family members, of our rules and regulations.  Applies to: All patients receiving prescriptions (written or electronic).  Pharmacy of record: Pharmacy where electronic prescriptions will be sent. If written prescriptions are taken to a different pharmacy, please inform the nursing staff. The pharmacy listed in the electronic medical record should be the one where you would like electronic prescriptions to be sent.  Electronic prescriptions: In compliance with the [place] Strengthen Opioid Misuse Prevention (STOP) Act of 2017 (Session Law 2017-74/H243), effective February 02, 2018, all controlled substances must be electronically prescribed. Calling prescriptions to the pharmacy will cease to exist.  Prescription refills: Only during scheduled appointments. Applies to all prescriptions.  NOTE: The following applies primarily to controlled substances (Opioid*  Pain Medications).   Type of encounter (visit): For patients receiving controlled substances, face-to-face visits are required. (Not an option or up to the patient.)  Patient's responsibilities: 1. Pain Pills: Bring all pain pills to every appointment (except for procedure appointments). 2. Pill Bottles: Bring pills in original pharmacy bottle. Always bring the newest bottle. Bring bottle, even if empty. 3. Medication refills: You are responsible for knowing and keeping track of what medications you take and those you need refilled. The day before your appointment: write a list of all prescriptions that need to be refilled. The day of the appointment: give the list to the admitting nurse. Prescriptions will be written only during appointments. No prescriptions will be written on procedure days. If you forget a medication: it will not be "Called in", "Faxed", or "electronically sent". You will need to get another appointment to get these prescribed. No early refills. Do not call asking to have your prescription filled early. 4. Prescription Accuracy: You are responsible for carefully inspecting your prescriptions before leaving our office. Have the discharge nurse carefully go over each prescription with you, before taking them home. Make sure that your name is accurately spelled, that your address is correct. Check the name and dose of your medication to make sure it is accurate. Check the number of pills, and the written instructions to make sure they are clear and accurate. Make sure that you are given enough medication to last until your next medication refill appointment. 5. Taking Medication: Take medication as prescribed. When it comes to controlled substances, taking less pills or less frequently than prescribed is permitted and encouraged. Never take more pills than instructed. Never take medication more frequently than prescribed.  6. Inform other Doctors: Always inform, all of your  healthcare providers, of all the medications you take. 7. Pain Medication from other Providers: You are not allowed to accept any additional pain medication from any other Doctor or Healthcare provider. There are two exceptions to this rule. (see below) In   the event that you require additional pain medication, you are responsible for notifying us, as stated below. 8. Cough Medicine: Often these contain an opioid, such as codeine or hydrocodone. Never accept or take cough medicine containing these opioids if you are already taking an opioid* medication. The combination may cause respiratory failure and death. 9. Medication Agreement: You are responsible for carefully reading and following our Medication Agreement. This must be signed before receiving any prescriptions from our practice. Safely store a copy of your signed Agreement. Violations to the Agreement will result in no further prescriptions. (Additional copies of our Medication Agreement are available upon request.) 10. Laws, Rules, & Regulations: All patients are expected to follow all Federal and State Laws, Statutes, Rules, & Regulations. Ignorance of the Laws does not constitute a valid excuse.  11. Illegal drugs and Controlled Substances: The use of illegal substances (including, but not limited to marijuana and its derivatives) and/or the illegal use of any controlled substances is strictly prohibited. Violation of this rule may result in the immediate and permanent discontinuation of any and all prescriptions being written by our practice. The use of any illegal substances is prohibited. 12. Adopted CDC guidelines & recommendations: Target dosing levels will be at or below 60 MME/day. Use of benzodiazepines** is not recommended.  Exceptions: There are only two exceptions to the rule of not receiving pain medications from other Healthcare Providers. 1. Exception #1 (Emergencies): In the event of an emergency (i.e.: accident requiring emergency  care), you are allowed to receive additional pain medication. However, you are responsible for: As soon as you are able, call our office [phone], at any time of the day or night, and leave a message stating your name, the date and nature of the emergency, and the name and dose of the medication prescribed. In the event that your call is answered by a member of our staff, make sure to document and save the date, time, and the name of the person that took your information.  2. Exception #2 (Planned Surgery): In the event that you are scheduled by another doctor or dentist to have any type of surgery or procedure, you are allowed (for a period no longer than 30 days), to receive additional pain medication, for the acute post-op pain. However, in this case, you are responsible for picking up a copy of our "Post-op Pain Management for Surgeons" handout, and giving it to your surgeon or dentist. This document is available at our office, and does not require an appointment to obtain it. Simply go to our office during business hours (Monday-Thursday from 8:00 AM to 4:00 PM) (Friday 8:00 AM to 12:00 Noon) or if you have a scheduled appointment with us, prior to your surgery, and ask for it by name. In addition, you are responsible for: calling our office [phone], at any time of the day or night, and leaving a message stating your name, name of your surgeon, type of surgery, and date of procedure or surgery. Failure to comply with your responsibilities may result in termination of therapy involving the controlled substances.  *Opioid medications include: morphine, codeine, oxycodone, oxymorphone, hydrocodone, hydromorphone, meperidine, tramadol, tapentadol, buprenorphine, fentanyl, methadone. **Benzodiazepine medications include: diazepam (Valium), alprazolam (Xanax), clonazepam (Klonopine), lorazepam (Ativan), clorazepate (Tranxene), chlordiazepoxide (Librium), estazolam (Prosom), oxazepam (Serax),  temazepam (Restoril), triazolam (Halcion) (Last updated: 01/01/2020) ____________________________________________________________________________________________    

## 2020-04-25 ENCOUNTER — Other Ambulatory Visit: Payer: Self-pay | Admitting: Family Medicine

## 2020-04-25 ENCOUNTER — Encounter: Payer: Self-pay | Admitting: *Deleted

## 2020-04-26 ENCOUNTER — Ambulatory Visit (INDEPENDENT_AMBULATORY_CARE_PROVIDER_SITE_OTHER): Payer: Medicare Other | Admitting: *Deleted

## 2020-04-26 DIAGNOSIS — R2689 Other abnormalities of gait and mobility: Secondary | ICD-10-CM

## 2020-04-26 DIAGNOSIS — G894 Chronic pain syndrome: Secondary | ICD-10-CM

## 2020-04-26 DIAGNOSIS — E1142 Type 2 diabetes mellitus with diabetic polyneuropathy: Secondary | ICD-10-CM

## 2020-04-26 DIAGNOSIS — M47816 Spondylosis without myelopathy or radiculopathy, lumbar region: Secondary | ICD-10-CM | POA: Diagnosis not present

## 2020-04-26 NOTE — Chronic Care Management (AMB) (Signed)
Chronic Care Management    Clinical Social Work Note  04/26/2020 Name: Alan Schmidt MRN: [id number] DOB: 08-02-1945  Alan Schmidt is a 75 y.o. year old male who is a primary care patient of [name], MD. The CCM team was consulted to assist the patient with chronic disease management and/or care coordination needs related to: Level of Care Concerns with regards to Gait Instability, History of Falls/Fall Risk, Chronic Pain and Osteoarthritis.   Engaged with patient by telephone for initial visit in response to provider referral for social work chronic care management and care coordination services.   Consent to Services:  The patient was given information about Chronic Care Management services, agreed to services, and gave verbal consent prior to initiation of services.  Please see initial visit note for detailed documentation.   Patient agreed to services and consent obtained.   Assessment: Review of patient past medical history, allergies, medications, and health status, including review of relevant consultants reports was performed today as part of a comprehensive evaluation and provision of chronic care management and care coordination services.     SDOH (Social Determinants of Health) assessments and interventions performed:    Advanced Directives Status: Not ready or willing to discuss.  CCM Care Plan  Allergies  Allergen Reactions  . Contrast Media [Iodinated Diagnostic Agents] Swelling  . Iodine Swelling  . Shellfish Allergy Nausea And Vomiting and Swelling    Outpatient Encounter Medications as of 04/26/2020  Medication Sig Note  . acetaminophen (TYLENOL) 500 MG tablet Take 500 mg by mouth every 6 (six) hours as needed. 06/05/2019: Taking PRN, up to a couple times daily   . ALPRAZolam (XANAX) 1 MG tablet TAKE 0.5 TABLETS BY MOUTH 2 TIMES DAILY AS NEEDED FOR ANXIETY.   Marland Kitchen aspirin EC 81 MG tablet Take 81 mg by mouth daily.   Marland Kitchen buPROPion (WELLBUTRIN XL) 150 MG 24  hr tablet TAKE 1 TABLET (150 MG TOTAL) BY MOUTH DAILY FOR 7 DAYS, THEN 2 TABLETS (300 MG TOTAL) DAILY.   . carvedilol (COREG) 25 MG tablet TAKE 1 TABLET BY MOUTH TWICE A DAY   . celecoxib (CELEBREX) 100 MG capsule TAKE 1 CAPSULE (100 MG TOTAL) BY MOUTH 2 (TWO) TIMES DAILY AS NEEDED FOR MODERATE PAIN.   Marland Kitchen Continuous Blood Gluc Receiver (FREESTYLE LIBRE 14 DAY READER) DEVI 1 Device by Does not apply route daily. Use to scan to check blood sugar up to 7 times daily; E11.42,   . Continuous Blood Gluc Sensor (FREESTYLE LIBRE 14 DAY SENSOR) MISC 1 Device by Does not apply route every 14 (fourteen) days. E11.42   . desoximetasone (TOPICORT) 0.25 % cream APPLY CREAM TO AFFECTED AREA TWO TIMES DAILY, FOR UP TO 7 DAYS, DO NOT APPLY TO FACE   . escitalopram (LEXAPRO) 20 MG tablet TAKE 1 TABLET BY MOUTH EVERY DAY   . gabapentin (NEURONTIN) 300 MG capsule Take 1 capsule (300 mg total) by mouth at bedtime.   . hydrochlorothiazide (HYDRODIURIL) 25 MG tablet TAKE 1 TABLET BY MOUTH EVERY DAY   . insulin degludec (TRESIBA FLEXTOUCH) 100 UNIT/ML FlexTouch Pen INJECT 18 UNITS INTO THE SKIN DAILY AT 10 PM.   . insulin lispro (HUMALOG KWIKPEN) 100 UNIT/ML KwikPen INJECT 5-7 units INTO THE SKIN three times daily with a meal   . Insulin Pen Needle (BD PEN NEEDLE NANO U/F) 32G X 4 MM MISC USE EVERY DAY   . JARDIANCE 25 MG TABS tablet TAKE 1 TABLET BY MOUTH EVERY DAY   .  loratadine (CLARITIN) 10 MG tablet TAKE 1 TABLET BY MOUTH EVERY DAY   . lubiprostone (AMITIZA) 8 MCG capsule Take 1 capsule (8 mcg total) by mouth 2 (two) times daily with a meal. Swallow the medication whole. Do not break or chew the medication.   . meloxicam (MOBIC) 15 MG tablet Take 1 tablet (15 mg total) by mouth daily.   . metFORMIN (GLUCOPHAGE) 1000 MG tablet TAKE 1 TABLET BY MOUTH TWICE A DAY WITH MEALS   . mometasone (NASONEX) 50 MCG/ACT nasal spray INSTILL TWO SPRAYS INTO THE NOSE DAILY   . morphine (MS CONTIN) 30 MG 12 hr tablet Take 1 tablet (30  mg total) by mouth every 12 (twelve) hours. Must last 30 days. Do not break tablet   . [START ON 05/25/2020] morphine (MS CONTIN) 30 MG 12 hr tablet Take 1 tablet (30 mg total) by mouth every 12 (twelve) hours. Must last 30 days. Do not break tablet   . [START ON 06/24/2020] morphine (MS CONTIN) 30 MG 12 hr tablet Take 1 tablet (30 mg total) by mouth every 12 (twelve) hours. Must last 30 days. Do not break tablet   . naloxone (NARCAN) 2 MG/2ML injection Inject 1 mL (1 mg total) into the muscle as needed for up to 2 doses (for emergency use only). Always have available. Inject into thigh muscle and call 911   . olmesartan (BENICAR) 40 MG tablet TAKE 1 TABLET BY MOUTH EVERY DAY   . pantoprazole (PROTONIX) 40 MG tablet TAKE 1 TABLET BY MOUTH EVERY DAY   . pravastatin (PRAVACHOL) 40 MG tablet TAKE 1 TABLET BY MOUTH EVERY DAY   . sennosides-docusate sodium (SENOKOT-S) 8.6-50 MG tablet Take 1 tablet by mouth daily as needed.    . tamsulosin (FLOMAX) 0.4 MG CAPS capsule TAKE 1 CAPSULE BY MOUTH 2 TIMES DAILY.   Marland Kitchen VICTOZA 18 MG/3ML SOPN INJECT 1.8 MG UNDER THE SKIN ONCE DAILY   . Wheat Dextrin (BENEFIBER) POWD Take 6 g by mouth 3 (three) times daily before meals. (2 tsp = 6 g)    No facility-administered encounter medications on file as of 04/26/2020.    Patient Active Problem List   Diagnosis Date Noted  . Chronic use of opiate for therapeutic purpose 04/24/2020  . Left-sided back pain 03/27/2020  . Excessive gas 03/27/2020  . Balance problem 03/27/2020  . Uncomplicated opioid dependence ([place]) 01/24/2020  . Polypharmacy 01/24/2020  . Chronic low back pain (Bilateral) w/o sciatica 12/14/2019  . Pharmacologic therapy 05/29/2019  . Disorder of skeletal system 05/29/2019  . Problems influencing health status 05/29/2019  . Fall 05/15/2019  . Skin lesion 05/15/2019  . Colon cancer screening 05/15/2019  . Osteoarthritis involving multiple joints 02/07/2019  . Shortened hamstring muscle 02/07/2019  .  Spondylosis without myelopathy or radiculopathy, lumbosacral region 12/27/2018  . History of allergy to shellfish 12/27/2018  . History of allergy to povidone-iodine topical antiseptic 12/27/2018    Class: History of  . Right hip pain 05/03/2018  . 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 Thumb pain (Right) 03/03/2018  . Lesion of skin of foot 01/17/2018  . Chronic pain of both knees 05/18/2017  . Coccygodynia 04/19/2017  . DDD (degenerative disc disease), lumbar 04/19/2017  . Chronic musculoskeletal pain 04/19/2017  . Chronic headache 03/16/2017  . Chronic tension-type headache, intractable 02/24/2017  . Vertigo 02/24/2017  . Lumbar facet joint osteoarthritis (Bilateral) 02/16/2017  . History of allergy to radiographic contrast media 02/16/2017  .  Nevus 12/10/2016  . History of postoperative nausea and vomiting 11/24/2016  . Encounter for general adult medical examination with abnormal findings 09/25/2016  . Opioid-induced constipation (OIC) 05/14/2016  . Chronic pain syndrome 02/13/2016  . Chronic shoulder radicular pain (Left) 12/18/2015  . Chronic cervical radicular pain (Left) 12/18/2015  . Disturbance of skin sensation 11/13/2015  . Anxiety and depression 07/17/2015  . Allergic rhinitis 05/22/2015  . History of Vasovagal response to spinal injections 04/30/2015    Class: History of  . BPH (benign prostatic hyperplasia) 04/17/2015  . Diabetes ([place]) 03/19/2015  . Chronic shoulder pain (Bilateral) (R>L) 03/06/2015  . Occipital neuralgia (Left) 03/06/2015  . Cervicogenic headache (Left) 03/06/2015  . Chronic low back pain (1ry area of Pain) (Bilateral) (R>L) w/ sciatica (Bilateral) 12/05/2014  . Lumbar facet syndrome (Bilateral) (R>L) 12/05/2014  . Lumbar spondylosis 12/05/2014  . Diabetic peripheral neuropathy ([place]) 12/05/2014  . Long term current use of opiate analgesic 12/05/2014  . Long term prescription opiate use 12/05/2014  . Opiate  use (60 MME/Day) 12/05/2014  . Opiate dependence ([place]) 12/05/2014  . Encounter for therapeutic drug level monitoring 12/05/2014  . Chronic neck pain (midline over the C7 spinous processes) (L>R) 12/05/2014  . Neurogenic pain 12/05/2014  . Neuropathic pain 12/05/2014  . Contrast dye allergy 12/05/2014  . Chronic lower extremity pain (2ry area of Pain) (Bilateral) (R>L) 12/05/2014  . 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) (Right L5 dermatome) 12/05/2014  . 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 Chronic hip pain (Right) 12/05/2014  . Class I Morbid obesity ([place]) (68% higher incidence of chronic low back pain) 12/05/2014  . Essential hypertension 12/05/2014  . GERD (gastroesophageal reflux disease) 12/05/2014  . Obstructive sleep apnea 12/05/2014  . Hyperlipidemia 12/05/2014  . Chronic kidney disease (CKD) 12/05/2014  . Abnormal nerve conduction studies (severe bilateral lower extremity polyneuropathy) 12/05/2014    Conditions to be addressed/monitored: Level of Care Concerns with regards to Gait Instability, History of Falls/Fall Risk, Chronic Pain and Osteoarthritis.  Limited Social Support, Level of Care Concerns, Social Isolation and Limited Ability to Perform ADL's Independently.  Care Plan : LCSW Plan of Care  Updates made by Francis Gaines, LCSW since 04/26/2020 12:00 AM    Problem: Receive Assistance with ADL's by Obtaining [place].   Priority: High    Goal: Receive Assistance with ADL's by Obtaining [place].   Start Date: 04/26/2020  Expected End Date: 06/26/2020  This Visit's Progress: On track  Priority: High  Note:   Current Barriers:    Patient with Gait Instability, History of Falls/Fall Risk and Chronic Pain needs Support, Education, and Care Coordination to resolve unmet personal care needs.  Patient unable to consistently perform ADL's (Activities of Daily Living) independently and needs assistance and support  in order to meet this unmet need.  Currently unable to independently self manage needs related to chronic health conditions.   Limited social support, level of care concerns, social isolation, and limited ability to perform ADL's independently.  Patient requires assistance with completion and submission of application for PCS ([place]), through [employer].  Clinical Goals: . Over the next 30 days patient will work with LCSW, and [facility] to coordinate care for [facility] and select a personal care service provider. . Over the next 45 to 60 days, patient will have personal care needs met as evident by having PCS Aide in the home assisting with needs.  Clinical Interventions : .  Assessed needs, level of care concerns, basic eligibility and provided education on PCS process.  . Provided list of PCS agencies and what to expect with PCS process. Marland Kitchen PCS referral will be faxed to [employer] at [employer] [phone], once completed and signed by PCP. Marland Kitchen LCSW will collaborate with [employer] to verify application is received and processed.  . Other interventions provided: Solution-Focused Strategies, Psychotropic Medication Adherence Assessment, Sleep Hygiene, Problem Solving, Teaching/Coaching Strategies. . Collaboration with [name], MD regarding development and update of comprehensive plan of care as evidenced by provider attestation and co-signature. Bertram Savin care team collaboration (see longitudinal plan of care). Patient Goals/Self-Care Activities:  . Review PCS (Personal Care Services) Information, Instructions, Application and PCS Agency Provider List and notify LCSW if you need assistance with application completion and submission. . Select 2 to 3 agencies you would like to use, keep this until your assessment is completed by [employer] and confirmation is received that you have been  approved for services. . Return calls from [facility], or call them directly, if you have questions # [phone] or # [phone], or if you wish to check the status of your application. Follow Up Plan:   05/09/2020 at 11:30am.      Follow Up Plan: LCSW will follow up with patient by phone on 05/09/2020 at 11:30am.      [name] LCSW Licensed Clinical Social Worker [place]  [phone]

## 2020-04-26 NOTE — Patient Instructions (Signed)
Visit Information  PATIENT GOALS:  Goals Addressed              This Visit's Progress   .  Receive Assistance with ADL's by Obtaining [place]. (pt-stated)   On track     Timeframe:  Short-Term Goal Priority:  High Start Date:   04/26/2020                          Expected End Date:  06/26/2020                  Follow Up Date:  05/09/2020 at 11:30am.  Patient Goals:  . Review PCS (Personal Care Services) Information, Instructions, Application and PCS Agency Provider List and notify LCSW if you need assistance with application completion and submission. . Select 2 to 3 agencies you would like to use, keep this until your assessment is completed by [employer] and confirmation is received that you have been approved for services. . Return calls from [facility], or call them directly, if you have questions # [phone] or # [phone], or if you wish to check the status of your application.   Why is this important?    Having a long-term illness can be scary.   It can also be stressful for you and your caregiver.   These steps may help.          Consent to CCM Services: Mr. Fishbaugh was given information about Chronic Care Management services today including:  1. CCM service includes personalized support from designated clinical staff supervised by his physician, including individualized plan of care and coordination with other care providers 2. 24/7 contact phone numbers for assistance for urgent and routine care needs. 3. Service will only be billed when office clinical staff spend 20 minutes or more in a month to coordinate care. 4. Only one practitioner may furnish and bill the service in a calendar month. 5. The patient may stop CCM services at any time (effective at the end of the month) by phone call to the office staff. 6. The patient will be responsible for cost sharing (co-pay) of up to 20% of the service fee  (after annual deductible is met).  Patient agreed to services and verbal consent obtained.   Patient verbalizes understanding of instructions provided today and agrees to view in [place].   Telephone follow up appointment with care management team member scheduled for: 05/09/2020 at 11:30am.  [name] LCSW Licensed Clinical Social Worker [place]  [phone]  CLINICAL CARE PLAN: Patient Care Plan: LCSW Plan of Care    Problem Identified: Receive Assistance with ADL's by Obtaining [place].   Priority: High    Goal: Receive Assistance with ADL's by Obtaining [place].   Start Date: 04/26/2020  Expected End Date: 06/26/2020  This Visit's Progress: On track  Priority: High  Note:   Current Barriers:    Patient with Gait Instability, History of Falls/Fall Risk and Chronic Pain needs Support, Education, and Care Coordination to resolve unmet personal care needs.  Patient unable to consistently perform ADL's (Activities of Daily Living) independently and needs assistance and support in order to meet this unmet need.  Currently unable to independently self manage needs related to chronic health conditions.   Limited social support, level of care concerns, social isolation, and limited ability to perform ADL's independently.  Patient requires assistance with completion and submission of application for PCS (  Personal Care Services), through [employer].  Clinical Goals: . Over the next 30 days patient will work with LCSW, and [facility] to coordinate care for [facility] and select a personal care service provider. . Over the next 45 to 60 days, patient will have personal care needs met as evident by having PCS Aide in the home assisting with needs.  Clinical Interventions : . Assessed needs, level of care concerns, basic eligibility and provided education on PCS process.  . Provided list of PCS agencies and what to expect  with PCS process. Marland Kitchen PCS referral will be faxed to [employer] at [employer] [phone], once completed and signed by PCP. Marland Kitchen LCSW will collaborate with [employer] to verify application is received and processed.  . Other interventions provided: Solution-Focused Strategies, Psychotropic Medication Adherence Assessment, Sleep Hygiene, Problem Solving, Teaching/Coaching Strategies. . Collaboration with [name], MD regarding development and update of comprehensive plan of care as evidenced by provider attestation and co-signature. Bertram Savin care team collaboration (see longitudinal plan of care). Patient Goals/Self-Care Activities:  . Review PCS (Personal Care Services) Information, Instructions, Application and PCS Agency Provider List and notify LCSW if you need assistance with application completion and submission. . Select 2 to 3 agencies you would like to use, keep this until your assessment is completed by [employer] and confirmation is received that you have been approved for services. . Return calls from [facility], or call them directly, if you have questions # [phone] or # [phone], or if you wish to check the status of your application. Follow Up Plan:   05/09/2020 at 11:30am.

## 2020-04-30 LAB — TOXASSURE SELECT 13 (MW), URINE

## 2020-05-01 ENCOUNTER — Other Ambulatory Visit: Payer: Self-pay | Admitting: Family Medicine

## 2020-05-01 DIAGNOSIS — F32A Depression, unspecified: Secondary | ICD-10-CM

## 2020-05-01 DIAGNOSIS — F419 Anxiety disorder, unspecified: Secondary | ICD-10-CM

## 2020-05-01 DIAGNOSIS — M47816 Spondylosis without myelopathy or radiculopathy, lumbar region: Secondary | ICD-10-CM | POA: Diagnosis not present

## 2020-05-03 ENCOUNTER — Ambulatory Visit (INDEPENDENT_AMBULATORY_CARE_PROVIDER_SITE_OTHER): Payer: Medicare Other | Admitting: Pharmacist

## 2020-05-03 DIAGNOSIS — F419 Anxiety disorder, unspecified: Secondary | ICD-10-CM

## 2020-05-03 DIAGNOSIS — Z794 Long term (current) use of insulin: Secondary | ICD-10-CM

## 2020-05-03 DIAGNOSIS — K5903 Drug induced constipation: Secondary | ICD-10-CM

## 2020-05-03 DIAGNOSIS — E1142 Type 2 diabetes mellitus with diabetic polyneuropathy: Secondary | ICD-10-CM

## 2020-05-03 DIAGNOSIS — G894 Chronic pain syndrome: Secondary | ICD-10-CM

## 2020-05-03 DIAGNOSIS — I10 Essential (primary) hypertension: Secondary | ICD-10-CM

## 2020-05-03 MED ORDER — OZEMPIC (1 MG/DOSE) 2 MG/1.5ML ~~LOC~~ SOPN: 1.0000 mg | PEN_INJECTOR | SUBCUTANEOUS | 1 refills | Status: DC

## 2020-05-03 NOTE — Chronic Care Management (AMB) (Signed)
Chronic Care Management Pharmacy Note  05/03/2020 Name:  Alan Schmidt MRN:  [id number] DOB:  02-17-1945  Subjective: Alan Schmidt is an 75 y.o. year old male who is a primary patient of [name], [name], MD.  The CCM team was consulted for assistance with disease management and care coordination needs.    Engaged with patient by telephone for follow up visit in response to provider referral for pharmacy case management and/or care coordination services.   Consent to Services:  The patient was given information about Chronic Care Management services, agreed to services, and gave verbal consent prior to initiation of services.  Please see initial visit note for detailed documentation.   Patient Care Team: [name], MD as PCP - General (Family Medicine) De Hollingshead, [place] as Pharmacist (Pharmacist) Michiel Cowboy, RN as [place] Management [place], Maree Erie, LCSW as Social Worker (Licensed Clinical Social Worker)  Recent office visits:  3/25 - LCSW for home support  2/22 - PCP - PT referral   Recent consult visits:  3/23 - pain management Dr. Dyanne Carrel visits: None in previous 6 months  Objective:  Lab Results  Component Value Date   CREATININE 1.09 03/22/2020   CREATININE 1.08 05/15/2019   CREATININE 1.04 10/26/2018    Lab Results  Component Value Date   HGBA1C 8.1 (H) 03/22/2020   Last diabetic Eye exam:  Lab Results  Component Value Date/Time   HMDIABEYEEXA Retinopathy (A) 11/10/2018 12:00 AM    Last diabetic Foot exam: No results found for: HMDIABFOOTEX      Component Value Date/Time   CHOL 141 03/22/2020 1053   TRIG 77 03/22/2020 1053   HDL 55 03/22/2020 1053   CHOLHDL 3 10/26/2018 1002   VLDL 25.2 10/26/2018 1002   LDLCALC 71 03/22/2020 1053   LDLDIRECT 53.0 05/15/2019 0920    Hepatic Function Latest Ref Rng & Units 03/22/2020 10/26/2018 09/27/2017  Total Protein 6.0 - 8.3 g/dL 7.2 7.2 7.2   Albumin 3.5 - 5.2 g/dL 4.1 4.0 4.1  AST 0 - 37 U/L 19 23 27   ALT 0 - 53 U/L 26 25 39  Alk Phosphatase 39 - 117 U/L 99 109 82  Total Bilirubin 0.2 - 1.2 mg/dL 0.4 0.3 0.4  Bilirubin, Direct 0.0 - 0.3 mg/dL - - -    Lab Results  Component Value Date/Time   TSH 2.64 09/27/2017 09:09 AM    CBC Latest Ref Rng & Units 11/17/2018 09/27/2017 10/25/2016  WBC 4.0 - 10.5 K/uL 7.8 7.7 9.4  Hemoglobin 13.0 - 17.0 g/dL 14.8 14.1 13.8  Hematocrit 39.0 - 52.0 % 46.4 43.9 41.5  Platelets 150.0 - 400.0 K/uL 250.0 255.0 278   Clinical ASCVD: No  The 10-year ASCVD risk score Mikey Bussing DC Jr., et al., 2013) is: 33.1%   Values used to calculate the score:     Age: 17 years     Sex: Male     Is Non-Hispanic African American: Yes     Diabetic: Yes     Tobacco smoker: No     Systolic Blood Pressure: 751 mmHg     Is BP treated: Yes     HDL Cholesterol: 55 mg/dL     Total Cholesterol: 141 mg/dL    Other: (CHADS2VASc if Afib, PHQ9 if depression, MMRC or CAT for COPD, ACT, DEXA)  Social History   Tobacco Use  Smoking Status Former Smoker  . Types: Cigarettes  Smokeless Tobacco Never Used  BP Readings from Last 3 Encounters:  04/24/20 128/83  03/26/20 90/60  12/14/19 127/84   Pulse Readings from Last 3 Encounters:  04/24/20 87  03/26/20 96  12/14/19 76   Wt Readings from Last 3 Encounters:  04/24/20 248 lb (112.5 kg)  03/26/20 248 lb 6.4 oz (112.7 kg)  12/14/19 259 lb (117.5 kg)    Assessment: Review of patient past medical history, allergies, medications, health status, including review of consultants reports, laboratory and other test data, was performed as part of comprehensive evaluation and provision of chronic care management services.   SDOH:  (Social Determinants of Health) assessments and interventions performed:    CCM Care Plan  Allergies  Allergen Reactions  . Contrast Media [Iodinated Diagnostic Agents] Swelling  . Iodine Swelling  . Shellfish Allergy Nausea And  Vomiting and Swelling    Medications Reviewed Today    Reviewed by De Hollingshead, RPH-CPP (Pharmacist) on 05/03/20 at 1123  Med List Status: <None>  Medication Order Taking? Sig Documenting Provider Last Dose Status Informant  acetaminophen (TYLENOL) 500 MG tablet [id number] Yes Take 500 mg by mouth every 6 (six) hours as needed. [provider] Taking Active            Med Note Mayo Ao Jun 05, 2019 10:14 AM) Taking PRN, up to a couple times daily   ALPRAZolam (XANAX) 1 MG tablet [id number] Yes TAKE 0.5 TABLETS BY MOUTH 2 TIMES DAILY AS NEEDED FOR ANXIETY. [name], MD Taking Active   aspirin EC 81 MG tablet [id number] Yes Take 81 mg by mouth daily. [provider] Taking Active   buPROPion (WELLBUTRIN XL) 150 MG 24 hr tablet [id number] Yes TAKE 1 TABLET (150 MG TOTAL) BY MOUTH DAILY FOR 7 DAYS, THEN 2 TABLETS (300 MG TOTAL) DAILY. [name], MD Taking Active   carvedilol (COREG) 25 MG tablet [id number] Yes TAKE 1 TABLET BY MOUTH TWICE A DAY [name], MD Taking Active   celecoxib (CELEBREX) 100 MG capsule [id number] Yes TAKE 1 CAPSULE (100 MG TOTAL) BY MOUTH 2 (TWO) TIMES DAILY AS NEEDED FOR MODERATE PAIN. [name], MD Taking Active   Continuous Blood Gluc Receiver (FREESTYLE LIBRE 14 DAY READER) [place] [id number] Yes 1 Device by Does not apply route daily. Use to scan to check blood sugar up to 7 times daily; E11.42, [name], MD Taking Active   Continuous Blood Gluc Sensor (FREESTYLE LIBRE [place]) [place] [id number] Yes 1 Device by Does not apply route every 14 (fourteen) days. E11.42 [name], MD Taking Active   desoximetasone (TOPICORT) 0.25 % cream [id number]  APPLY CREAM TO AFFECTED AREA TWO TIMES DAILY, FOR UP TO 7 DAYS, DO NOT APPLY TO Benita Stabile, MD  Active   escitalopram (LEXAPRO) 20 MG tablet [id number] Yes TAKE 1 TABLET BY MOUTH EVERY DAY [name], MD Taking Active    gabapentin (NEURONTIN) 300 MG capsule [id number] Yes Take 1 capsule (300 mg total) by mouth at bedtime. Milinda Pointer, MD Taking Expired 04/26/20 2359   hydrochlorothiazide (HYDRODIURIL) 25 MG tablet [id number] Yes TAKE 1 TABLET BY MOUTH EVERY DAY [name], MD Taking Active   insulin degludec (TRESIBA FLEXTOUCH) 100 UNIT/ML FlexTouch Pen [id number] Yes INJECT 18 UNITS INTO THE SKIN DAILY AT 10 PM. [name], MD Taking Active            Med Note Darnelle Maffucci, Edythe Riches E   Fri May 03, 2020 11:19 AM) 12 units daily  insulin lispro (HUMALOG KWIKPEN) 100 UNIT/ML KwikPen [id number] Yes INJECT 5-7 units INTO THE SKIN three times daily with a meal [name], MD Taking Active            Med Note Darnelle Maffucci, Arville Lime   Fri May 03, 2020 11:20 AM) 5 units TID with meal  Insulin Pen Needle (BD PEN NEEDLE NANO U/F) 32G X 4 MM MISC [id number] Yes USE EVERY DAY [name], MD Taking Active   JARDIANCE 25 MG TABS tablet [id number] Yes TAKE 1 TABLET BY MOUTH EVERY DAY [name], MD Taking Active   loratadine (CLARITIN) 10 MG tablet [id number] Yes TAKE 1 TABLET BY MOUTH EVERY DAY [name], MD Taking Active   lubiprostone (AMITIZA) 8 MCG capsule [id number] Yes Take 1 capsule (8 mcg total) by mouth 2 (two) times daily with a meal. Swallow the medication whole. Do not break or chew the medication. Milinda Pointer, MD Taking Expired 03/13/20 2359   meloxicam (MOBIC) 15 MG tablet [id number] Yes Take 1 tablet (15 mg total) by mouth daily. Milinda Pointer, MD Taking Expired 03/13/20 2359   metFORMIN (GLUCOPHAGE) 1000 MG tablet [id number] Yes TAKE 1 TABLET BY MOUTH TWICE A DAY WITH MEALS [name], MD Taking Active   mometasone (NASONEX) 50 MCG/ACT nasal spray [id number] Yes INSTILL TWO SPRAYS INTO THE NOSE DAILY [name], MD Taking Active   morphine (MS CONTIN) 30 MG 12 hr tablet [id number] Yes Take 1 tablet (30 mg total) by mouth every 12 (twelve) hours.  Must last 30 days. Do not break tablet Milinda Pointer, MD Taking Active   morphine (MS CONTIN) 30 MG 12 hr tablet [id number]  Take 1 tablet (30 mg total) by mouth every 12 (twelve) hours. Must last 30 days. Do not break tablet Milinda Pointer, MD  Active   morphine (MS CONTIN) 30 MG 12 hr tablet [id number]  Take 1 tablet (30 mg total) by mouth every 12 (twelve) hours. Must last 30 days. Do not break tablet Milinda Pointer, MD  Active   naloxone [facility]) 2 MG/2ML injection [id number]  Inject 1 mL (1 mg total) into the muscle as needed for up to 2 doses (for emergency use only). Always have available. Inject into thigh muscle and call [place] Milinda Pointer, MD  Active   olmesartan (BENICAR) 40 MG tablet [id number] Yes TAKE 1 TABLET BY MOUTH EVERY DAY [name], MD Taking Active   pantoprazole (PROTONIX) 40 MG tablet [id number] Yes TAKE 1 TABLET BY MOUTH EVERY DAY [name], MD Taking Active   pravastatin (PRAVACHOL) 40 MG tablet [id number] Yes TAKE 1 TABLET BY MOUTH EVERY DAY [name], MD Taking Active   sennosides-docusate sodium (SENOKOT-S) 8.6-50 MG tablet [id number] Yes Take 1 tablet by mouth daily as needed.  [provider] Taking Active   tamsulosin (FLOMAX) 0.4 MG CAPS capsule [id number] Yes TAKE 1 CAPSULE BY MOUTH 2 TIMES DAILY. [name], MD Taking Active   VICTOZA 18 MG/3ML SOPN [id number] Yes INJECT 1.8 MG UNDER THE SKIN ONCE DAILY [name], MD Taking Active   Wheat Dextrin [facility]) POWD [id number] Yes Take 6 g by mouth 3 (three) times daily before meals. (2 tsp = 6 g) Milinda Pointer, MD Taking Expired 03/13/20 2359   Med List Note Janett Billow, [place] 04/24/20 1506): UDS 04/24/20 MR 07/25/19          Patient Active Problem List  Diagnosis Date Noted  . Chronic use of opiate for therapeutic purpose 04/24/2020  . Left-sided back pain 03/27/2020  . Excessive gas 03/27/2020  . Balance problem 03/27/2020  .  Uncomplicated opioid dependence ([place]) 01/24/2020  . Polypharmacy 01/24/2020  . Chronic low back pain (Bilateral) w/o sciatica 12/14/2019  . Pharmacologic therapy 05/29/2019  . Disorder of skeletal system 05/29/2019  . Problems influencing health status 05/29/2019  . Fall 05/15/2019  . Skin lesion 05/15/2019  . Colon cancer screening 05/15/2019  . Osteoarthritis involving multiple joints 02/07/2019  . Shortened hamstring muscle 02/07/2019  . Spondylosis without myelopathy or radiculopathy, lumbosacral region 12/27/2018  . History of allergy to shellfish 12/27/2018  . History of allergy to povidone-iodine topical antiseptic 12/27/2018    Class: History of  . Right hip pain 05/03/2018  . 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 Thumb pain (Right) 03/03/2018  . Lesion of skin of foot 01/17/2018  . Chronic pain of both knees 05/18/2017  . Coccygodynia 04/19/2017  . DDD (degenerative disc disease), lumbar 04/19/2017  . Chronic musculoskeletal pain 04/19/2017  . Chronic headache 03/16/2017  . Chronic tension-type headache, intractable 02/24/2017  . Vertigo 02/24/2017  . Lumbar facet joint osteoarthritis (Bilateral) 02/16/2017  . History of allergy to radiographic contrast media 02/16/2017  . Nevus 12/10/2016  . History of postoperative nausea and vomiting 11/24/2016  . Encounter for general adult medical examination with abnormal findings 09/25/2016  . Opioid-induced constipation (OIC) 05/14/2016  . Chronic pain syndrome 02/13/2016  . Chronic shoulder radicular pain (Left) 12/18/2015  . Chronic cervical radicular pain (Left) 12/18/2015  . Disturbance of skin sensation 11/13/2015  . Anxiety and depression 07/17/2015  . Allergic rhinitis 05/22/2015  . History of Vasovagal response to spinal injections 04/30/2015    Class: History of  . BPH (benign prostatic hyperplasia) 04/17/2015  . Diabetes ([place]) 03/19/2015  . Chronic shoulder pain (Bilateral)  (R>L) 03/06/2015  . Occipital neuralgia (Left) 03/06/2015  . Cervicogenic headache (Left) 03/06/2015  . Chronic low back pain (1ry area of Pain) (Bilateral) (R>L) w/ sciatica (Bilateral) 12/05/2014  . Lumbar facet syndrome (Bilateral) (R>L) 12/05/2014  . Lumbar spondylosis 12/05/2014  . Diabetic peripheral neuropathy ([place]) 12/05/2014  . Long term current use of opiate analgesic 12/05/2014  . Long term prescription opiate use 12/05/2014  . Opiate use (60 MME/Day) 12/05/2014  . Opiate dependence ([place] 12/05/2014  . Encounter for therapeutic drug level monitoring 12/05/2014  . Chronic neck pain (midline over the C7 spinous processes) (L>R) 12/05/2014  . Neurogenic pain 12/05/2014  . Neuropathic pain 12/05/2014  . Contrast dye allergy 12/05/2014  . Chronic lower extremity pain (2ry area of Pain) (Bilateral) (R>L) 12/05/2014  . 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) (Right L5 dermatome) 12/05/2014  . 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 Chronic hip pain (Right) 12/05/2014  . Class I Morbid obesity ([place]) (68% higher incidence of chronic low back pain) 12/05/2014  . Essential hypertension 12/05/2014  . GERD (gastroesophageal reflux disease) 12/05/2014  . Obstructive sleep apnea 12/05/2014  . Hyperlipidemia 12/05/2014  . Chronic kidney disease (CKD) 12/05/2014  . Abnormal nerve conduction studies (severe bilateral lower extremity polyneuropathy) 12/05/2014    Immunization History  Administered Date(s) Administered  . Fluad Quad(high Dose 65+) 10/26/2018  . Influenza, High Dose Seasonal PF 11/21/2015, 10/27/2016, 11/06/2017, 11/24/2019  . Moderna Sars-Covid-2 Vaccination 03/23/2019, 04/20/2019, 11/24/2019  . Pneumococcal Conjugate-13 11/21/2015  . Pneumococcal Polysaccharide-23 10/21/2017  . Tdap 04/09/2015  Conditions to be addressed/monitored: HTN, HLD, DMII, Depression and chronic pain  Care Plan : Medication Management  Updates made by De Hollingshead, RPH-CPP since 05/03/2020 12:00 AM    Problem: Diabetes, HTN, HLD, Chronic Pain     Long-Range Goal: Medication Monitoring   Start Date: 05/03/2020  This Visit's Progress: On track  Priority: High  Note:   Current Barriers:  . Unable to achieve control of diabetes   Pharmacist Clinical Goal(s):  Marland Kitchen Over the next 90 days, patient will achieve control of diabetes as evidenced by A1c through collaboration with PharmD and provider.   Interventions: . 1:1 collaboration with [name], MD regarding development and update of comprehensive plan of care as evidenced by provider attestation and co-signature . Inter-disciplinary care team collaboration (see longitudinal plan of care) . Comprehensive medication review performed; medication list updated in electronic medical record  Diabetes: . Uncontrolled, but Libre glucose readings more controlled than A1c; current treatment: metformin 1000 mg BID, Jardiance 25 mg daily, Victoza 1.8 mg daily, Tresiba 12 units daily, Humalog 4-5 units based upon expected carbohydrates, though cannot verbalize a specific rule  . Current glucose readings: Date of Download: 3/19-05/03/20 % Time CGM is active: 85% Average Glucose: 152 mg/dL Glucose Management Indicator: 6.9%  Glucose Variability: 24.3 (goal <36%) Time in Goal:  - Time in range 70-180: 77% - Time above range: 23% - Time below range: % Observed patterns: generally stable, some elevated post prandials, possibly tied to chronic pain . Denies hypoglycemic symptoms . Switch from Victoza to [place] for improved benefit. Complete Victoza supply. Start Ozempic 1 mg weekly. Hold Humalog, decrease Tresiba to 10 units daily. Contact provider with any episodes of hypoglycemia . Otherwise, continue current regimen . Notes he has a hard time cooking for himself. Care Guide referral for food support (Meals on wheels)  Hypertension: . Controlled; current treatment: amlodipine 5 mg daily,  carvedilol 25 mg BID, HCTZ 25 mg daily, olmesartan 40 mg daily; . Recommended to continue current regimen at this time  Hyperlipidemia: . Controlled; current treatment: pravastatin 40 mg daily . Recommended to continue current regimen at this time  Depression/Anxiety/Chronic Pain: . Uncontrolled pain but moderate mood; current treatment: escitalopram 20 mg daily, alprazolam 0.5 mg PRN; bupropion XL 300 mg daily; morphine ER 30 mg BID; celecoxib 100 mg BID PRN, acetaminophen 500 mg PRN, gabapentin 300 mg QPM; follows w/ Pain Management Dr. Consuela Mimes  . Constipation: Amitiza 8 mcg BID - reports this does not adequately treat constipation. Using OTC senna/docusate and fiber as well.  . Reports that he is starting PT and hopes that provides benefit . Reports he lost a bottle of morphine after picking up at the pharmacy. Asks if there is anything he should do besides continue to look for it.  . Encouraged to discuss constipation w/ Dr. Consuela Mimes at next appointment.  . Continue collaboration with PCP, pain management, physical therapy as above. Advised to search for morphine, as he is unlikely to be able to get an early fill given control status. Avoid combination of celecoxib with ibuprofen. Patient verbalizes understanding   Patient Goals/Self-Care Activities . Over the next 90 days, patient will:  - take medications as prescribed check glucose at least three times daily using CGM, document, and provide at future appointments check blood pressure periodically, document, and provide at future appointments collaborate with provider on medication access solutions  Follow Up Plan: Telephone follow up appointment with care management team member scheduled for:  5  weeks     Medication Assistance: None required.  Patient affirms current coverage meets needs.  Patient's preferred pharmacy [facility]:  [id number] [place]:  [phone]: [phone] [id number] [address]Phone: [phone]: [phone]  Follow Up:  Patient agrees to Care Plan and Follow-up.  Plan: Telephone follow up appointment with care management team member scheduled for:  ~ 5 weeks  [name] TDarnelle Maffucci PharmD, BSUNY [place] CSea BrightClinical Pharmacist LOccidental Petroleumat [employer] [phone]

## 2020-05-03 NOTE — Patient Instructions (Addendum)
When you finish Victoza, switch to Ozempic. Inject Ozempic 1 mg ONCE WEEKLY.   When you start Ozempic, STOP Humalog and DECREASE Tresiba to 10 units.   PLEASE CALL ME if you have low blood sugars.   [name], PharmD [phone] Visit Information  PATIENT GOALS: Goals Addressed              This Visit's Progress     Patient Stated   .  Medication Monitoring (pt-stated)        Patient Goals/Self-Care Activities . Over the next 90 days, patient will:  - take medications as prescribed check glucose at least three times daily using CGM, document, and provide at future appointments check blood pressure periodically, document, and provide at future appointments collaborate with provider on medication access solutions       The patient verbalized understanding of instructions, educational materials, and care plan provided today and agreed to receive a mailed copy of patient instructions, educational materials, and care plan.   Plan: Telephone follow up appointment with care management team member scheduled for:  ~ 5 weeks  [name], PharmD, [place], [place] Clinical Pharmacist [employer] at [employer] [phone]

## 2020-05-07 ENCOUNTER — Telehealth: Payer: Medicare Other

## 2020-05-07 ENCOUNTER — Other Ambulatory Visit: Payer: Self-pay | Admitting: *Deleted

## 2020-05-07 ENCOUNTER — Ambulatory Visit: Payer: Medicare Other | Admitting: Family Medicine

## 2020-05-07 DIAGNOSIS — E119 Type 2 diabetes mellitus without complications: Secondary | ICD-10-CM

## 2020-05-07 NOTE — Patient Instructions (Addendum)
Goals Addressed            This Visit's Progress   . [facility]) Patient will verbalize continuation of monitoring and managing his diabetes for the next 90 days       Timeframe:  Long-Range Goal Priority:  High Start Date: 05/07/20                            Expected End Date: 05/07/21                      Follow Up Date 08/01/20    - check blood sugar at prescribed times - check blood sugar if I feel it is too high or too low - enter blood sugar readings and medication or insulin into daily log - take the blood sugar log to all doctor visits  .  -Encouraged patient to check his blood sugar before meals and before bed via CGM for better diabetes control and to prevent sugar drops . Nurse congratulated patient on decreasing his A1c to 8.1 form 8.4 . Discussed eating consistently throughout the day to prevent blood sugar drops.    Why is this important?    Checking your blood sugar at home helps to keep it from getting very high or very low.   Writing the results in a diary or log helps the doctor know how to care for you.   Your blood sugar log should have the time, date and the results.   Also, write down the amount of insulin or other medicine that you take.   Other information, like what you ate, exercise done and how you were feeling, will also be helpful.     Notes: Nurse will send calendar booklet which has education and areas to write down his blood sugar values. Nurse will send Glucerna coupons    . Patient will report an A1c reduction of 0.2-0.5 points within the next 90 days   On track    [place] (see longtitudinal plan of care for additional care plan information)  Objective:  Lab Results  Component Value Date   HGBA1C 8.4 (H) 05/15/2019 .   Lab Results  Component Value Date   CREATININE 1.08 05/15/2019   CREATININE 1.04 10/26/2018   CREATININE 0.94 06/10/2018 .   Marland Kitchen No results found for: EGFR  Current Barriers:  Marland Kitchen Knowledge Deficits related to basic  Diabetes pathophysiology and self care/management  Case Manager Clinical Goal(s):  Over the next 90 days, patient will demonstrate improved adherence to prescribed treatment plan for diabetes self care/management as evidenced by:  Marland Kitchen Verbalize daily monitoring and recording of CBG within 90 days . Verbalize adherence to ADA/ carb modified diet within the next 90 days . Verbalize exercise 2-3 other than physical therapy days/week . Verbalize adherence to prescribed medication regimen within the next 90 days  . Verbalize completion of annual eye exam within the next 90 days  Interventions:  . Reviewed medications with patient and discussed importance of medication adherence . Discussed plans with patient for ongoing care management follow up and provided patient with direct contact information for care management team . Provided patient with written educational materials related to hypo and hyperglycemia and importance of correct treatment . Encouraged patient to do chair exercise 2-3 times weekly, discussed eating a snack at night with protein to prevent hypoglycemia during sleeping hours, nurse will send Glucerna coupons , will send exercise program booklet .  Encouraged patient to check his blood sugar before meals and before bed via CGM for better diabetes control and to prevent sugar drops . Nurse congratulated patient on decreasing his A1c to 8.1 form 8.4   Patient Self Care Activities:  . Self administers oral medications as prescribed . Self administers insulin as prescribed . Self administers injectable DM medication Tyler Aas) as prescribed . Adheres to prescribed ADA/carb modified . Patient discussed that he has not been checking his blood sugar before meals and before bed. Education was provided of the importance of this for best diabetic control.   Timeframe:  Long-Range Goal Priority:  High Start Date:  08/28/19                           Expected End Date: 10/01/20 Follow Up  Date: 08/01/20  Updated 05/07/20

## 2020-05-07 NOTE — Patient Outreach (Addendum)
[facility]) Care Management  [place]  05/07/2020   Alan Schmidt 1945-11-04 [id number]  Subjective: Successful telephone outreach call to patient. HIPAA identifiers obtained. Patient states that he is experiencing a lot of pain today. He has chronic hip and back pain and he does see Dr. Dossie Arbour who manages his pain. Patient states that his blood sugar readings have been some better recently. His FBS this morning was 135, his FBS ranges have been 60-150, his postprandial rages have been 140-175 to an occasional > 200. Patient reports that his blood sugar drops below 70 less frequently. Nurse provided hypoglycemia education and congratulated patient on his A1c decreasing to 8.1. Patient reports that LCSW has mailed him the application for PCS. He states that he will begin filling out the application soon. Nurse requested that he contact her or LCSW for further assistance with the application process if needed. Patient reports needing to go to the dentist and would like assistance with locating a dentist. Nurse will send a referral to Therapist, nutritional. Patient did not have any further questions or concerns today and did confirm that he has this nurse's contact number to call her if needed.    Encounter Medications:  Outpatient Encounter Medications as of 05/07/2020  Medication Sig Note  . acetaminophen (TYLENOL) 500 MG tablet Take 500 mg by mouth every 6 (six) hours as needed. 06/05/2019: Taking PRN, up to a couple times daily   . ALPRAZolam (XANAX) 1 MG tablet TAKE 0.5 TABLETS BY MOUTH 2 TIMES DAILY AS NEEDED FOR ANXIETY.   Marland Kitchen aspirin EC 81 MG tablet Take 81 mg by mouth daily.   Marland Kitchen buPROPion (WELLBUTRIN XL) 150 MG 24 hr tablet TAKE 1 TABLET (150 MG TOTAL) BY MOUTH DAILY FOR 7 DAYS, THEN 2 TABLETS (300 MG TOTAL) DAILY.   . carvedilol (COREG) 25 MG tablet TAKE 1 TABLET BY MOUTH TWICE A DAY   . celecoxib (CELEBREX) 100 MG capsule TAKE 1 CAPSULE (100 MG TOTAL) BY  MOUTH 2 (TWO) TIMES DAILY AS NEEDED FOR MODERATE PAIN.   Marland Kitchen Continuous Blood Gluc Receiver (FREESTYLE LIBRE 14 DAY READER) DEVI 1 Device by Does not apply route daily. Use to scan to check blood sugar up to 7 times daily; E11.42,   . Continuous Blood Gluc Sensor (FREESTYLE LIBRE 14 DAY SENSOR) MISC 1 Device by Does not apply route every 14 (fourteen) days. E11.42   . desoximetasone (TOPICORT) 0.25 % cream APPLY CREAM TO AFFECTED AREA TWO TIMES DAILY, FOR UP TO 7 DAYS, DO NOT APPLY TO FACE   . escitalopram (LEXAPRO) 20 MG tablet TAKE 1 TABLET BY MOUTH EVERY DAY   . hydrochlorothiazide (HYDRODIURIL) 25 MG tablet TAKE 1 TABLET BY MOUTH EVERY DAY   . insulin degludec (TRESIBA FLEXTOUCH) 100 UNIT/ML FlexTouch Pen INJECT 18 UNITS INTO THE SKIN DAILY AT 10 PM. 05/03/2020: 12 units daily  . insulin lispro (HUMALOG KWIKPEN) 100 UNIT/ML KwikPen INJECT 5-7 units INTO THE SKIN three times daily with a meal 05/03/2020: 5 units TID with meal  . Insulin Pen Needle (BD PEN NEEDLE NANO U/F) 32G X 4 MM MISC USE EVERY DAY   . JARDIANCE 25 MG TABS tablet TAKE 1 TABLET BY MOUTH EVERY DAY   . loratadine (CLARITIN) 10 MG tablet TAKE 1 TABLET BY MOUTH EVERY DAY   . metFORMIN (GLUCOPHAGE) 1000 MG tablet TAKE 1 TABLET BY MOUTH TWICE A DAY WITH MEALS   . mometasone (NASONEX) 50 MCG/ACT nasal spray INSTILL TWO SPRAYS  INTO THE NOSE DAILY   . morphine (MS CONTIN) 30 MG 12 hr tablet Take 1 tablet (30 mg total) by mouth every 12 (twelve) hours. Must last 30 days. Do not break tablet   . [START ON 05/25/2020] morphine (MS CONTIN) 30 MG 12 hr tablet Take 1 tablet (30 mg total) by mouth every 12 (twelve) hours. Must last 30 days. Do not break tablet   . [START ON 06/24/2020] morphine (MS CONTIN) 30 MG 12 hr tablet Take 1 tablet (30 mg total) by mouth every 12 (twelve) hours. Must last 30 days. Do not break tablet   . naloxone (NARCAN) 2 MG/2ML injection Inject 1 mL (1 mg total) into the muscle as needed for up to 2 doses (for emergency use  only). Always have available. Inject into thigh muscle and call 911   . olmesartan (BENICAR) 40 MG tablet TAKE 1 TABLET BY MOUTH EVERY DAY   . pantoprazole (PROTONIX) 40 MG tablet TAKE 1 TABLET BY MOUTH EVERY DAY   . pravastatin (PRAVACHOL) 40 MG tablet TAKE 1 TABLET BY MOUTH EVERY DAY   . Semaglutide, 1 MG/DOSE, (OZEMPIC, 1 MG/DOSE,) 2 MG/1.5ML SOPN Inject 1 mg into the skin once a week.   . sennosides-docusate sodium (SENOKOT-S) 8.6-50 MG tablet Take 1 tablet by mouth daily as needed.    . tamsulosin (FLOMAX) 0.4 MG CAPS capsule TAKE 1 CAPSULE BY MOUTH 2 TIMES DAILY.   Marland Kitchen gabapentin (NEURONTIN) 300 MG capsule Take 1 capsule (300 mg total) by mouth at bedtime.   Marland Kitchen lubiprostone (AMITIZA) 8 MCG capsule Take 1 capsule (8 mcg total) by mouth 2 (two) times daily with a meal. Swallow the medication whole. Do not break or chew the medication.   . meloxicam (MOBIC) 15 MG tablet Take 1 tablet (15 mg total) by mouth daily.   . Wheat Dextrin (BENEFIBER) POWD Take 6 g by mouth 3 (three) times daily before meals. (2 tsp = 6 g)    No facility-administered encounter medications on file as of 05/07/2020.    Functional Status:  In your present state of health, do you have any difficulty performing the following activities: 04/26/2020 10/18/2019  Hearing? N N  Vision? N N  Difficulty concentrating or making decisions? N N  Walking or climbing stairs? Y N  Comment Unsteady Gait/Balance. -  Dressing or bathing? N N  Doing errands, shopping? N [place] and eating ? N N  Comment - -  Using the Toilet? N N  In the past six months, have you accidently leaked urine? N N  Do you have problems with loss of bowel control? N N  Managing your Medications? N N  Comment - -  Managing your Finances? N N  Housekeeping or managing your Housekeeping? Y N  Comment Unsteady Gait/Balance. -  Some recent data might be hidden    Fall/Depression Screening: Fall Risk  05/07/2020 04/26/2020 04/24/2020   Falls in the past year? 1 0 0  Comment - - -  Number falls in past yr: 1 0 0  Injury with Fall? 1 0 -  Comment - - -  Risk for fall due to : History of fall(s);Impaired mobility;Impaired balance/gait History of fall(s);Impaired balance/gait;Impaired mobility No Fall Risks  Risk for fall due to: Comment - - -  Follow up Falls prevention discussed;Education provided;Falls evaluation completed Education provided;Falls prevention discussed Falls evaluation completed   [facility] 2/9 Scores 04/26/2020 03/26/2020 12/04/2019 10/18/2019 08/17/2019 07/26/2019 06/21/2019  PHQ -  2 Score 0 0 0 0 0 0 0  PHQ- 9 Score - - - - - - -  Exception Documentation - - - - - - -  Not completed - - - - - - -    Assessment:  Goals Addressed            This Visit's Progress   . [facility]) Patient will verbalize continuation of monitoring and managing his diabetes for the next 90 days       Timeframe:  Long-Range Goal Priority:  High Start Date: 05/07/20                            Expected End Date: 05/07/21                      Follow Up Date 08/01/20    - check blood sugar at prescribed times - check blood sugar if I feel it is too high or too low - enter blood sugar readings and medication or insulin into daily log - take the blood sugar log to all doctor visits  .  -Encouraged patient to check his blood sugar before meals and before bed via CGM for better diabetes control and to prevent sugar drops . Nurse congratulated patient on decreasing his A1c to 8.1 form 8.4 . Discussed eating consistently throughout the day to prevent blood sugar drops.    Why is this important?    Checking your blood sugar at home helps to keep it from getting very high or very low.   Writing the results in a diary or log helps the doctor know how to care for you.   Your blood sugar log should have the time, date and the results.   Also, write down the amount of insulin or other medicine that you take.   Other information, like what you  ate, exercise done and how you were feeling, will also be helpful.     Notes: Nurse will send calendar booklet which has education and areas to write down his blood sugar values. Nurse will send Glucerna coupons    . Patient will report an A1c reduction of 0.2-0.5 points within the next 90 days   On track    [place] (see longtitudinal plan of care for additional care plan information)  Objective:  Lab Results  Component Value Date   HGBA1C 8.4 (H) 05/15/2019 .   Lab Results  Component Value Date   CREATININE 1.08 05/15/2019   CREATININE 1.04 10/26/2018   CREATININE 0.94 06/10/2018 .   Marland Kitchen No results found for: EGFR  Current Barriers:  Marland Kitchen Knowledge Deficits related to basic Diabetes pathophysiology and self care/management  Case Manager Clinical Goal(s):  Over the next 90 days, patient will demonstrate improved adherence to prescribed treatment plan for diabetes self care/management as evidenced by:  Marland Kitchen Verbalize daily monitoring and recording of CBG within 90 days . Verbalize adherence to ADA/ carb modified diet within the next 90 days . Verbalize exercise 2-3 other than physical therapy days/week . Verbalize adherence to prescribed medication regimen within the next 90 days  . Verbalize completion of annual eye exam within the next 90 days  Interventions:  . Reviewed medications with patient and discussed importance of medication adherence . Discussed plans with patient for ongoing care management follow up and provided patient with direct contact information for care management team . Provided patient with written educational materials  related to hypo and hyperglycemia and importance of correct treatment . Encouraged patient to do chair exercise 2-3 times weekly, discussed eating a snack at night with protein to prevent hypoglycemia during sleeping hours, nurse will send Glucerna coupons , will send exercise program booklet . Encouraged patient to check his blood sugar  before meals and before bed via CGM for better diabetes control and to prevent sugar drops . Nurse congratulated patient on decreasing his A1c to 8.1 form 8.4   Patient Self Care Activities:  . Self administers oral medications as prescribed . Self administers insulin as prescribed . Self administers injectable DM medication Tyler Aas) as prescribed . Adheres to prescribed ADA/carb modified . Patient discussed that he has not been checking his blood sugar before meals and before bed. Education was provided of the importance of this for best diabetic control.   Timeframe:  Long-Range Goal Priority:  High Start Date:  08/28/19                           Expected End Date: 10/01/20 Follow Up Date: 08/01/20  Updated 05/07/20                              Plan: RN Health Coach will send PCP today's assessment note, will send patient Glucerna coupons and calendar booklet, will send referral to community resource coordination, and will call patient within the month of June. Follow-up:  Patient agrees to Care Plan and Follow-up.  [name] RN, BSN [place] [phone] [email or web address]

## 2020-05-08 ENCOUNTER — Telehealth: Payer: Self-pay | Admitting: Family Medicine

## 2020-05-08 DIAGNOSIS — N200 Calculus of kidney: Secondary | ICD-10-CM

## 2020-05-08 NOTE — Telephone Encounter (Signed)
I called and spoke with the patient and he stated from his fall he has some scrapes and bruises but he is ok, just a little sore.  Patient stated he would  like a referral to a urologist because he has had kidney stones in the past that had to be removed surgically through his back.  He stated he is having some constant pain in his right kidney and he would like to see someone that specializes in kidneys to address..  Alan Schmidt,cma

## 2020-05-08 NOTE — Telephone Encounter (Signed)
Pt called to report that he fell yesterday and that's why he missed his appt. Pt stated that he didn't hit his head he's  just is very sore. He didn't go to the ED  Pt is requesting an appt asap

## 2020-05-09 ENCOUNTER — Ambulatory Visit: Payer: Medicare Other | Admitting: *Deleted

## 2020-05-09 ENCOUNTER — Ambulatory Visit: Payer: Self-pay | Admitting: *Deleted

## 2020-05-09 ENCOUNTER — Telehealth: Payer: Self-pay | Admitting: Family Medicine

## 2020-05-09 DIAGNOSIS — G894 Chronic pain syndrome: Secondary | ICD-10-CM

## 2020-05-09 DIAGNOSIS — I10 Essential (primary) hypertension: Secondary | ICD-10-CM | POA: Diagnosis not present

## 2020-05-09 DIAGNOSIS — R2689 Other abnormalities of gait and mobility: Secondary | ICD-10-CM

## 2020-05-09 DIAGNOSIS — E1142 Type 2 diabetes mellitus with diabetic polyneuropathy: Secondary | ICD-10-CM

## 2020-05-09 DIAGNOSIS — E785 Hyperlipidemia, unspecified: Secondary | ICD-10-CM | POA: Diagnosis not present

## 2020-05-09 DIAGNOSIS — M792 Neuralgia and neuritis, unspecified: Secondary | ICD-10-CM

## 2020-05-09 DIAGNOSIS — M47816 Spondylosis without myelopathy or radiculopathy, lumbar region: Secondary | ICD-10-CM | POA: Diagnosis not present

## 2020-05-09 DIAGNOSIS — F419 Anxiety disorder, unspecified: Secondary | ICD-10-CM | POA: Diagnosis not present

## 2020-05-09 DIAGNOSIS — F32A Depression, unspecified: Secondary | ICD-10-CM | POA: Diagnosis not present

## 2020-05-09 DIAGNOSIS — Z794 Long term (current) use of insulin: Secondary | ICD-10-CM | POA: Diagnosis not present

## 2020-05-09 NOTE — Telephone Encounter (Signed)
Lvm for the patient to call . Shaterria Sager,cma

## 2020-05-09 NOTE — Telephone Encounter (Signed)
Annette with [facility] called in to let Dr.Sonnenberg that she is faxing over forms for him to fill out for in home service for patient

## 2020-05-09 NOTE — Chronic Care Management (AMB) (Signed)
Chronic Care Management    Clinical Social Work Note  05/09/2020 Name: Alan Schmidt MRN: [id number] DOB: Mar 02, 1945  Alan Schmidt is a 75 y.o. year old male who is a primary care patient of [name], [name], MD. The CCM team was consulted to assist the patient with chronic disease management and/or care coordination needs related to: Level of Care Concerns regarding Balance Problems, History of Falls/Fall Risk and Chronic Pain.   Engaged with patient by telephone for follow up visit in response to provider referral for social work chronic care management and care coordination services.   Consent to Services:  The patient was given information about Chronic Care Management services, agreed to services, and gave verbal consent prior to initiation of services.  Please see initial visit note for detailed documentation.   Patient agreed to services and consent obtained.   Assessment: Review of patient past medical history, allergies, medications, and health status, including review of relevant consultants reports was performed today as part of a comprehensive evaluation and provision of chronic care management and care coordination services.     SDOH (Social Determinants of Health) assessments and interventions performed:    Advanced Directives Status: Not addressed in this encounter.  CCM Care Plan  Allergies  Allergen Reactions  . Contrast Media [Iodinated Diagnostic Agents] Swelling  . Iodine Swelling  . Shellfish Allergy Nausea And Vomiting and Swelling    Outpatient Encounter Medications as of 05/09/2020  Medication Sig Note  . acetaminophen (TYLENOL) 500 MG tablet Take 500 mg by mouth every 6 (six) hours as needed. 06/05/2019: Taking PRN, up to a couple times daily   . ALPRAZolam (XANAX) 1 MG tablet TAKE 0.5 TABLETS BY MOUTH 2 TIMES DAILY AS NEEDED FOR ANXIETY.   Marland Kitchen aspirin EC 81 MG tablet Take 81 mg by mouth daily.   Marland Kitchen buPROPion (WELLBUTRIN XL) 150 MG 24 hr tablet TAKE 1  TABLET (150 MG TOTAL) BY MOUTH DAILY FOR 7 DAYS, THEN 2 TABLETS (300 MG TOTAL) DAILY.   . carvedilol (COREG) 25 MG tablet TAKE 1 TABLET BY MOUTH TWICE A DAY   . celecoxib (CELEBREX) 100 MG capsule TAKE 1 CAPSULE (100 MG TOTAL) BY MOUTH 2 (TWO) TIMES DAILY AS NEEDED FOR MODERATE PAIN.   Marland Kitchen Continuous Blood Gluc Receiver (FREESTYLE LIBRE 14 DAY READER) DEVI 1 Device by Does not apply route daily. Use to scan to check blood sugar up to 7 times daily; E11.42,   . Continuous Blood Gluc Sensor (FREESTYLE LIBRE 14 DAY SENSOR) MISC 1 Device by Does not apply route every 14 (fourteen) days. E11.42   . desoximetasone (TOPICORT) 0.25 % cream APPLY CREAM TO AFFECTED AREA TWO TIMES DAILY, FOR UP TO 7 DAYS, DO NOT APPLY TO FACE   . escitalopram (LEXAPRO) 20 MG tablet TAKE 1 TABLET BY MOUTH EVERY DAY   . gabapentin (NEURONTIN) 300 MG capsule Take 1 capsule (300 mg total) by mouth at bedtime.   . hydrochlorothiazide (HYDRODIURIL) 25 MG tablet TAKE 1 TABLET BY MOUTH EVERY DAY   . insulin degludec (TRESIBA FLEXTOUCH) 100 UNIT/ML FlexTouch Pen INJECT 18 UNITS INTO THE SKIN DAILY AT 10 PM. 05/03/2020: 12 units daily  . insulin lispro (HUMALOG KWIKPEN) 100 UNIT/ML KwikPen INJECT 5-7 units INTO THE SKIN three times daily with a meal 05/03/2020: 5 units TID with meal  . Insulin Pen Needle (BD PEN NEEDLE NANO U/F) 32G X 4 MM MISC USE EVERY DAY   . JARDIANCE 25 MG TABS tablet TAKE 1 TABLET  BY MOUTH EVERY DAY   . loratadine (CLARITIN) 10 MG tablet TAKE 1 TABLET BY MOUTH EVERY DAY   . lubiprostone (AMITIZA) 8 MCG capsule Take 1 capsule (8 mcg total) by mouth 2 (two) times daily with a meal. Swallow the medication whole. Do not break or chew the medication.   . meloxicam (MOBIC) 15 MG tablet Take 1 tablet (15 mg total) by mouth daily.   . metFORMIN (GLUCOPHAGE) 1000 MG tablet TAKE 1 TABLET BY MOUTH TWICE A DAY WITH MEALS   . mometasone (NASONEX) 50 MCG/ACT nasal spray INSTILL TWO SPRAYS INTO THE NOSE DAILY   . morphine (MS CONTIN)  30 MG 12 hr tablet Take 1 tablet (30 mg total) by mouth every 12 (twelve) hours. Must last 30 days. Do not break tablet   . [START ON 05/25/2020] morphine (MS CONTIN) 30 MG 12 hr tablet Take 1 tablet (30 mg total) by mouth every 12 (twelve) hours. Must last 30 days. Do not break tablet   . [START ON 06/24/2020] morphine (MS CONTIN) 30 MG 12 hr tablet Take 1 tablet (30 mg total) by mouth every 12 (twelve) hours. Must last 30 days. Do not break tablet   . naloxone (NARCAN) 2 MG/2ML injection Inject 1 mL (1 mg total) into the muscle as needed for up to 2 doses (for emergency use only). Always have available. Inject into thigh muscle and call 911   . olmesartan (BENICAR) 40 MG tablet TAKE 1 TABLET BY MOUTH EVERY DAY   . pantoprazole (PROTONIX) 40 MG tablet TAKE 1 TABLET BY MOUTH EVERY DAY   . pravastatin (PRAVACHOL) 40 MG tablet TAKE 1 TABLET BY MOUTH EVERY DAY   . Semaglutide, 1 MG/DOSE, (OZEMPIC, 1 MG/DOSE,) 2 MG/1.5ML SOPN Inject 1 mg into the skin once a week.   . sennosides-docusate sodium (SENOKOT-S) 8.6-50 MG tablet Take 1 tablet by mouth daily as needed.    . tamsulosin (FLOMAX) 0.4 MG CAPS capsule TAKE 1 CAPSULE BY MOUTH 2 TIMES DAILY.   Marland Kitchen Wheat Dextrin (BENEFIBER) POWD Take 6 g by mouth 3 (three) times daily before meals. (2 tsp = 6 g)    No facility-administered encounter medications on file as of 05/09/2020.    Patient Active Problem List   Diagnosis Date Noted  . Chronic use of opiate for therapeutic purpose 04/24/2020  . Left-sided back pain 03/27/2020  . Excessive gas 03/27/2020  . Balance problem 03/27/2020  . Uncomplicated opioid dependence ([place]) 01/24/2020  . Polypharmacy 01/24/2020  . Chronic low back pain (Bilateral) w/o sciatica 12/14/2019  . Pharmacologic therapy 05/29/2019  . Disorder of skeletal system 05/29/2019  . Problems influencing health status 05/29/2019  . Fall 05/15/2019  . Skin lesion 05/15/2019  . Colon cancer screening 05/15/2019  . Osteoarthritis involving  multiple joints 02/07/2019  . Shortened hamstring muscle 02/07/2019  . Spondylosis without myelopathy or radiculopathy, lumbosacral region 12/27/2018  . History of allergy to shellfish 12/27/2018  . History of allergy to povidone-iodine topical antiseptic 12/27/2018    Class: History of  . Right hip pain 05/03/2018  . 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 Thumb pain (Right) 03/03/2018  . Lesion of skin of foot 01/17/2018  . Chronic pain of both knees 05/18/2017  . Coccygodynia 04/19/2017  . DDD (degenerative disc disease), lumbar 04/19/2017  . Chronic musculoskeletal pain 04/19/2017  . Chronic headache 03/16/2017  . Chronic tension-type headache, intractable 02/24/2017  . Vertigo 02/24/2017  . Lumbar facet joint osteoarthritis (  Bilateral) 02/16/2017  . History of allergy to radiographic contrast media 02/16/2017  . Nevus 12/10/2016  . History of postoperative nausea and vomiting 11/24/2016  . Encounter for general adult medical examination with abnormal findings 09/25/2016  . Opioid-induced constipation (OIC) 05/14/2016  . Chronic pain syndrome 02/13/2016  . Chronic shoulder radicular pain (Left) 12/18/2015  . Chronic cervical radicular pain (Left) 12/18/2015  . Disturbance of skin sensation 11/13/2015  . Anxiety and depression 07/17/2015  . Allergic rhinitis 05/22/2015  . History of Vasovagal response to spinal injections 04/30/2015    Class: History of  . BPH (benign prostatic hyperplasia) 04/17/2015  . Diabetes ([place]) 03/19/2015  . Chronic shoulder pain (Bilateral) (R>L) 03/06/2015  . Occipital neuralgia (Left) 03/06/2015  . Cervicogenic headache (Left) 03/06/2015  . Chronic low back pain (1ry area of Pain) (Bilateral) (R>L) w/ sciatica (Bilateral) 12/05/2014  . Lumbar facet syndrome (Bilateral) (R>L) 12/05/2014  . Lumbar spondylosis 12/05/2014  . Diabetic peripheral neuropathy ([place]) 12/05/2014  . Long term current use of opiate  analgesic 12/05/2014  . Long term prescription opiate use 12/05/2014  . Opiate use (60 MME/Day) 12/05/2014  . Opiate dependence ([place]) 12/05/2014  . Encounter for therapeutic drug level monitoring 12/05/2014  . Chronic neck pain (midline over the C7 spinous processes) (L>R) 12/05/2014  . Neurogenic pain 12/05/2014  . Neuropathic pain 12/05/2014  . Contrast dye allergy 12/05/2014  . Chronic lower extremity pain (2ry area of Pain) (Bilateral) (R>L) 12/05/2014  . 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) (Right L5 dermatome) 12/05/2014  . 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 Chronic hip pain (Right) 12/05/2014  . Class I Morbid obesity ([place]) (68% higher incidence of chronic low back pain) 12/05/2014  . Essential hypertension 12/05/2014  . GERD (gastroesophageal reflux disease) 12/05/2014  . Obstructive sleep apnea 12/05/2014  . Hyperlipidemia 12/05/2014  . Chronic kidney disease (CKD) 12/05/2014  . Abnormal nerve conduction studies (severe bilateral lower extremity polyneuropathy) 12/05/2014    Conditions to be addressed/monitored:  Balance Problems, History of Falls/Fall Risk and Chronic Pain.  Limited Social Support, Level of Care Concerns, ADL/IADL Limitations, Social Isolation, Limited Access to Caregiver, Frequent Falls.  Care Plan : LCSW Plan of Care  Updates made by Francis Gaines, LCSW since 05/09/2020 12:00 AM    Problem: Receive Assistance with ADL's by Obtaining [place].   Priority: High    Goal: Receive Assistance with ADL's by Obtaining [place].   Start Date: 04/26/2020  Expected End Date: 06/26/2020  This Visit's Progress: On track  Recent Progress: On track  Priority: High  Note:   Current Barriers:    Patient with Gait Instability, History of Falls/Fall Risk and Chronic Pain needs Support, Education, and Care Coordination to resolve unmet personal care needs.  Patient unable to consistently perform ADL's  (Activities of Daily Living) independently and needs assistance and support in order to meet this unmet need.  Currently unable to independently self manage needs related to chronic health conditions.   Limited social support, level of care concerns, social isolation, and limited ability to perform ADL's independently.  Patient requires assistance with completion and submission of application for PCS ([place]), through [employer].  Clinical Goals: . Over the next 30 days patient will work with LCSW, and [facility] to coordinate care for [facility] and select a personal care service provider. . Over the next 45 to 60 days, patient will have personal care needs met as evident by having  PCS Aide in the home assisting with needs.  Clinical Interventions : . Assessed needs, level of care concerns, basic eligibility and provided education on PCS process.  . Faxed completed PCS application to PCP for review and signature. . Provided list of PCS agencies and what to expect with PCS process. Marland Kitchen PCS referral will be faxed to [employer] at [employer] [phone], once completed and signed by PCP. Marland Kitchen LCSW will collaborate with [employer] to verify application is received and processed.  . Other interventions provided: Solution-Focused Strategies, Psychotropic Medication Adherence Assessment, Sleep Hygiene, Problem Solving, Teaching/Coaching Strategies. . Collaboration with [name], MD regarding development and update of comprehensive plan of care as evidenced by provider attestation and co-signature. Bertram Savin care team collaboration (see longitudinal plan of care). Patient Goals/Self-Care Activities:  . LCSW completed PCS application on your behalf and faxed to [name], Primary Care Physician, for review and signature. . Once the PCS application has been signed by [name], Primary Care  Physician, LCSW will fax the application to [facility] for processing. . Select 2 to 3 in-home care agencies that you would like to use, from the list of PCS providers provided to you, and be prepared to share your agencies of choice with representative from [employer]. Marland Kitchen Keep the list of PCS providers until your assessment is completed by [employer], and confirmation is received that you have been approved for services. . Return calls from [facility], or call them directly, if you have questions # [phone] or # [phone], or if you wish to check the status of your application. Follow Up Plan:   05/23/2020 at 11:00am.      Follow Up Plan:  05/23/2020 at 11:00am.      [name] LCSW Licensed Clinical Social Worker [place]  [phone]

## 2020-05-09 NOTE — Telephone Encounter (Addendum)
I will go ahead and place a referral, though if he is having constant pain he should be seen sooner than Urology will be able to see him. Please offer him the next available 30 minute time slot though if this is not within the next week he can be scheduled in a 15 minute slot.

## 2020-05-09 NOTE — Addendum Note (Signed)
Addended by: [name] on: 05/09/2020 03:47 PM   Modules accepted: Orders

## 2020-05-09 NOTE — Patient Instructions (Signed)
Visit Information  PATIENT GOALS: Goals Addressed              This Visit's Progress   .  Receive Assistance with ADL's by Obtaining [place]. (pt-stated)   On track     Timeframe:  Short-Term Goal Priority:  High Start Date:   04/26/2020                          Expected End Date:  06/26/2020                  Follow Up Date:  05/23/2020 at 11:00am.  Patient Goals:  . LCSW completed PCS (Personal Care Services) application on your behalf and faxed to [name], Primary Care Physician, for review and signature. . Once the [facility]) application has been signed by [name], Primary Care Physician, LCSW will fax the application to [facility] for processing. . Select 2 to 3 in-home care agencies that you would like to use, from the list of PCS ([place]) providers provided to you, and be prepared to share your agencies of choice with representative from [employer]. Marland Kitchen Keep the list of PCS ([place]) providers until your assessment is completed by [employer], and confirmation is received that you have been approved for services. . Return calls from [facility], or call them directly, if you have questions # [phone] or # [phone], or if you wish to check the status of your application.           Patient verbalizes understanding of instructions provided today and agrees to view in [place].   Telephone follow up appointment with care management team member scheduled for:  05/23/2020 at 11:00am.  [name] LCSW Licensed Clinical Social Worker [place]  [phone]

## 2020-05-09 NOTE — Telephone Encounter (Signed)
   Telephone encounter was:  Successful.  05/09/2020 Name: Alan Schmidt MRN: [id number] DOB: 02/12/1945  Alan Schmidt is a 75 y.o. year old male who is a primary care patient of [name], [name], MD . The community resource team was consulted for assistance with Food Insecurity and [place] guide performed the following interventions: Patient provided with information about care guide support team and interviewed to confirm resource needs Discussed resources to assist with food in securities and in home care.  Obtained verbal consent to place patient referral to [facility], [facility] and [employer]'s  Placed referral to [place] via email. .  Follow Up Plan:  No further follow up planned at this time. The patient has been provided with needed resources.  [place], Care Management Phone: [phone] Email: [email or web address]

## 2020-05-10 ENCOUNTER — Telehealth: Payer: Self-pay

## 2020-05-10 NOTE — Telephone Encounter (Signed)
Agree with need for urgent care or ED evaluation. Please follow-up with the patient to make sure he gets evaluated.

## 2020-05-10 NOTE — Telephone Encounter (Signed)
Left detailed message for patient to be evaluated at UC/ED per PCP request.

## 2020-05-10 NOTE — Telephone Encounter (Signed)
Pt called in and states that his throat is sore and he can not urinate. He states that he fell a few days ago. No numbness anywhere or SOB. Pt would like a call back today and did not want to talk to access nurse. No appts avail at time of call

## 2020-05-10 NOTE — Telephone Encounter (Signed)
Pt called back to schedule an appt for Tuesday  Pt stated that his throat isn't sore he is just going hoarse easily  Pt didn't want to go to UC he wanted to see Dr. [name] he doesn't want to make multiple trips do to his Prosthesis and him being a high fall risk    Please call pt back and triage again

## 2020-05-10 NOTE — Telephone Encounter (Signed)
Spoken to patient , he stated the more he speaks the more his throat is feeling sore and swollen like when he has allergies and mouth is dry. He has an issue with swallowing. He also can not urinate fully, he has also been having left flank pain. He stated he only dribbles but he hasn't voided no where near what he has put in. Patient has not taken any medications for his sx. Fell two night ago as well, patient has cut on big toe no other injuries. No fever, chills, abdominal px, SOB, jaw px, arm px, nauseated and vomiting. Patient was instructed to go to UC/ED.

## 2020-05-10 NOTE — Telephone Encounter (Signed)
He needs to be seen prior to next week given his decreased urination. There is the potential that he may need a catheter and we can not accomplish that here in the office. He needs to be seen at urgent care or the ED for evaluation.

## 2020-05-10 NOTE — Telephone Encounter (Signed)
   Telephone encounter was:  Successful.  05/10/2020 Name: Alan Schmidt MRN: [id number] DOB: May 29, 1945  [name] is a 75 y.o. year old male who is a primary care patient of [name], [name], MD . The community resource team was consulted for assistance with assistance locating a dentist  Care guide performed the following interventions: Patient provided with information about care guide support team and interviewed to confirm resource needs Spoke with patient verified his email address I will send dental information next week..  Follow Up Plan:  Care guide will follow up with patient by phone over the next 7 days  Julee Stoll, AAS Paralegal, [place] . Embedded Care Coordination [facility]  Care Management  [address], [place] [address] ??[email or web address]  ?? [phone]   [email or web address]

## 2020-05-13 ENCOUNTER — Telehealth: Payer: Self-pay

## 2020-05-13 DIAGNOSIS — M47816 Spondylosis without myelopathy or radiculopathy, lumbar region: Secondary | ICD-10-CM | POA: Diagnosis not present

## 2020-05-13 NOTE — Telephone Encounter (Signed)
   Telephone encounter was:  Unsuccessful.  05/13/2020 Name: Alan Schmidt MRN: [id number] DOB: [birth year]  Unsuccessful outbound call made today to assist with:  Dental resources  Outreach Attempt:  2nd Attempt  A HIPAA compliant voice message was left requesting a return call.  Instructed patient to call back at [phone].  Eneida Evers, AAS Paralegal, [place] . Embedded Care Coordination [facility]  Care Management  [address], [place] [address] ??[email or web address]  ?? [phone]   [email or web address]

## 2020-05-14 ENCOUNTER — Encounter: Payer: Self-pay | Admitting: Family Medicine

## 2020-05-14 ENCOUNTER — Ambulatory Visit (INDEPENDENT_AMBULATORY_CARE_PROVIDER_SITE_OTHER): Payer: Medicare Other | Admitting: Family Medicine

## 2020-05-14 ENCOUNTER — Other Ambulatory Visit: Payer: Self-pay

## 2020-05-14 VITALS — BP 130/80 | HR 88 | Temp 98.4°F | Ht 73.0 in | Wt 240.8 lb

## 2020-05-14 DIAGNOSIS — Z87442 Personal history of urinary calculi: Secondary | ICD-10-CM

## 2020-05-14 DIAGNOSIS — R0989 Other specified symptoms and signs involving the circulatory and respiratory systems: Secondary | ICD-10-CM | POA: Diagnosis not present

## 2020-05-14 DIAGNOSIS — I10 Essential (primary) hypertension: Secondary | ICD-10-CM

## 2020-05-14 DIAGNOSIS — R1032 Left lower quadrant pain: Secondary | ICD-10-CM | POA: Diagnosis not present

## 2020-05-14 DIAGNOSIS — R109 Unspecified abdominal pain: Secondary | ICD-10-CM | POA: Diagnosis not present

## 2020-05-14 DIAGNOSIS — W19XXXA Unspecified fall, initial encounter: Secondary | ICD-10-CM

## 2020-05-14 MED ORDER — EPINEPHRINE 0.3 MG/0.3ML IJ SOAJ: 0.3000 mg | INTRAMUSCULAR | 0 refills | Status: AC | PRN

## 2020-05-14 NOTE — Progress Notes (Signed)
Alan Rumps, MD Phone: [phone]  Alan Schmidt is a 75 y.o. male who presents today for follow-up.  Hypertension: Not checking blood pressures.  He believes he remained on his amlodipine after last visit.  He continues on carvedilol, HCTZ, and olmesartan.  No chest pain or shortness of breath.  He has difficulty describing if he has lightheadedness or just in balance.  Throat tightening: Patient notes there is a day last week for several hours he felt as though his throat was tightening up.  It was more difficult to swallow and breathe when this occurred.  This occurred at the same time he had his urine issues that are outlined below.  He notes no cough, postnasal drip, respiratory issues other than the breathing.  No new medications or supplements.  He did not have any intervention for this and it improved on its own.  The only thing he can think that he had that was different than typical was blueberries.  History of kidney stones: Patient thinks he may have a kidney stone.  He notes last week he had trouble urinating for several hours.  Notes this resolved on its own.  He has had left flank pain intermittently for some time now.  Notes that seems to hurt more with movement though notes it does feel like his history of kidney stones.  He notes his urine flow is good now.  No blood in his urine.  No dysuria.  Fall: Patient notes he had a fall over the weekend.  The only injury was to his right big toe where the distal portion of the nail was torn off.  He notes it bled quite a bit though has improved.  He also notes a small abrasion to the dorsum of his right great toe  Social History   Tobacco Use  Smoking Status Former Smoker  . Types: Cigarettes  Smokeless Tobacco Never Used    Current Outpatient Medications on File Prior to Visit  Medication Sig Dispense Refill  . acetaminophen (TYLENOL) 500 MG tablet Take 500 mg by mouth every 6 (six) hours as needed.    . ALPRAZolam  (XANAX) 1 MG tablet TAKE 0.5 TABLETS BY MOUTH 2 TIMES DAILY AS NEEDED FOR ANXIETY. 30 tablet 0  . aspirin EC 81 MG tablet Take 81 mg by mouth daily.    Marland Kitchen buPROPion (WELLBUTRIN XL) 150 MG 24 hr tablet TAKE 1 TABLET (150 MG TOTAL) BY MOUTH DAILY FOR 7 DAYS, THEN 2 TABLETS (300 MG TOTAL) DAILY. 180 tablet 1  . carvedilol (COREG) 25 MG tablet TAKE 1 TABLET BY MOUTH TWICE A DAY 60 tablet 5  . celecoxib (CELEBREX) 100 MG capsule TAKE 1 CAPSULE (100 MG TOTAL) BY MOUTH 2 (TWO) TIMES DAILY AS NEEDED FOR MODERATE PAIN. 60 capsule 0  . Continuous Blood Gluc Receiver (FREESTYLE LIBRE 14 DAY READER) DEVI 1 Device by Does not apply route daily. Use to scan to check blood sugar up to 7 times daily; E11.42, 1 Device 1  . Continuous Blood Gluc Sensor (FREESTYLE LIBRE 14 DAY SENSOR) MISC 1 Device by Does not apply route every 14 (fourteen) days. E11.42 6 each 3  . desoximetasone (TOPICORT) 0.25 % cream APPLY CREAM TO AFFECTED AREA TWO TIMES DAILY, FOR UP TO 7 DAYS, DO NOT APPLY TO FACE 30 g 0  . escitalopram (LEXAPRO) 20 MG tablet TAKE 1 TABLET BY MOUTH EVERY DAY 90 tablet 1  . hydrochlorothiazide (HYDRODIURIL) 25 MG tablet TAKE 1 TABLET BY MOUTH EVERY DAY  30 tablet 5  . insulin degludec (TRESIBA FLEXTOUCH) 100 UNIT/ML FlexTouch Pen INJECT 18 UNITS INTO THE SKIN DAILY AT 10 PM. 15 mL 3  . insulin lispro (HUMALOG KWIKPEN) 100 UNIT/ML KwikPen INJECT 5-7 units INTO THE SKIN three times daily with a meal 15 mL 2  . Insulin Pen Needle (BD PEN NEEDLE NANO U/F) 32G X 4 MM MISC USE EVERY DAY 100 each 4  . JARDIANCE 25 MG TABS tablet TAKE 1 TABLET BY MOUTH EVERY DAY 30 tablet 3  . loratadine (CLARITIN) 10 MG tablet TAKE 1 TABLET BY MOUTH EVERY DAY 30 tablet 11  . metFORMIN (GLUCOPHAGE) 1000 MG tablet TAKE 1 TABLET BY MOUTH TWICE A DAY WITH MEALS 60 tablet 5  . mometasone (NASONEX) 50 MCG/ACT nasal spray INSTILL TWO SPRAYS INTO THE NOSE DAILY 17 each 3  . morphine (MS CONTIN) 30 MG 12 hr tablet Take 1 tablet (30 mg total) by  mouth every 12 (twelve) hours. Must last 30 days. Do not break tablet 60 tablet 0  . [START ON 05/25/2020] morphine (MS CONTIN) 30 MG 12 hr tablet Take 1 tablet (30 mg total) by mouth every 12 (twelve) hours. Must last 30 days. Do not break tablet 60 tablet 0  . [START ON 06/24/2020] morphine (MS CONTIN) 30 MG 12 hr tablet Take 1 tablet (30 mg total) by mouth every 12 (twelve) hours. Must last 30 days. Do not break tablet 60 tablet 0  . naloxone (NARCAN) 2 MG/2ML injection Inject 1 mL (1 mg total) into the muscle as needed for up to 2 doses (for emergency use only). Always have available. Inject into thigh muscle and call 911 2 mL 1  . olmesartan (BENICAR) 40 MG tablet TAKE 1 TABLET BY MOUTH EVERY DAY 30 tablet 5  . pantoprazole (PROTONIX) 40 MG tablet TAKE 1 TABLET BY MOUTH EVERY DAY 30 tablet 5  . pravastatin (PRAVACHOL) 40 MG tablet TAKE 1 TABLET BY MOUTH EVERY DAY 30 tablet 5  . Semaglutide, 1 MG/DOSE, (OZEMPIC, 1 MG/DOSE,) 2 MG/1.5ML SOPN Inject 1 mg into the skin once a week. 4.5 mL 1  . sennosides-docusate sodium (SENOKOT-S) 8.6-50 MG tablet Take 1 tablet by mouth daily as needed.     . tamsulosin (FLOMAX) 0.4 MG CAPS capsule TAKE 1 CAPSULE BY MOUTH 2 TIMES DAILY. 60 capsule 8  . gabapentin (NEURONTIN) 300 MG capsule Take 1 capsule (300 mg total) by mouth at bedtime. 90 capsule 0  . lubiprostone (AMITIZA) 8 MCG capsule Take 1 capsule (8 mcg total) by mouth 2 (two) times daily with a meal. Swallow the medication whole. Do not break or chew the medication. 60 capsule 2  . meloxicam (MOBIC) 15 MG tablet Take 1 tablet (15 mg total) by mouth daily. 30 tablet 2  . Wheat Dextrin (BENEFIBER) POWD Take 6 g by mouth 3 (three) times daily before meals. (2 tsp = 6 g) 730 g 2   No current facility-administered medications on file prior to visit.     ROS see history of present illness  Objective  Physical Exam Vitals:   05/14/20 0809  BP: 130/80  Pulse: 88  Temp: 98.4 F (36.9 C)  SpO2: 93%     BP Readings from Last 3 Encounters:  05/14/20 130/80  04/24/20 128/83  03/26/20 90/60   Wt Readings from Last 3 Encounters:  05/14/20 240 lb 12.8 oz (109.2 kg)  04/24/20 248 lb (112.5 kg)  03/26/20 248 lb 6.4 oz (112.7 kg)    Physical  Exam Constitutional:      General: He is not in acute distress.    Appearance: He is not diaphoretic.  Cardiovascular:     Rate and Rhythm: Normal rate and regular rhythm.     Heart sounds: Normal heart sounds.  Pulmonary:     Effort: Pulmonary effort is normal.     Breath sounds: Normal breath sounds.  Abdominal:     General: Bowel sounds are normal. There is no distension.     Palpations: Abdomen is soft.     Tenderness: There is no abdominal tenderness. There is no guarding or rebound.  Skin:    General: Skin is warm and dry.     Comments: Right great toenail with a small portion of the distal tip removed, no signs of infection, no apparent break in the skin, there is a small abrasion on the dorsum of his right great toe which appears to be well-healing with no signs of infection.  Neurological:     Mental Status: He is alert.      Assessment/Plan: Please see individual problem list.  Problem List Items Addressed This Visit    Essential hypertension    Adequate control.  I have asked him to discontinue his amlodipine just as I did at his last visit.  For now he will continue on carvedilol, HCTZ, and olmesartan.  We will see if discontinuing the amlodipine helps with his balance.      Relevant Medications   EPINEPHrine (EPIPEN 2-PAK) 0.3 mg/0.3 mL IJ SOAJ injection   Fall    Injury to great toe on the right foot appears to be superficial.  No bony tenderness.  He will monitor.       Throat tightness - Primary    Undetermined cause.  Symptoms resolved in a relatively short period of time.  I am unsure if this was an allergic reaction or related to something else.  Given the potential that this could represent an allergic reaction  we will provide him with an EpiPen prescription to keep on hand.  Discussed if he ever has to use this for throat swelling, tongue swelling, lip swelling, or signs of an anaphylactic reaction he would need to be evaluated in the emergency room right away.      Relevant Medications   EPINEPHrine (EPIPEN 2-PAK) 0.3 mg/0.3 mL IJ SOAJ injection   Left flank pain    Possibly related to a kidney stone versus musculoskeletal cause.  Given his symptoms and his history of kidney stones we will obtain a CT renal stone protocol.  I will also refer her to urology.  He is already on Flomax.      History of kidney stones    Refer to urology.       Other Visit Diagnoses    Left lower quadrant abdominal pain       Relevant Orders   CT RENAL STONE STUDY   POCT Urinalysis Dipstick      This visit occurred during the SARS-CoV-2 public health emergency.  Safety protocols were in place, including screening questions prior to the visit, additional usage of staff PPE, and extensive cleaning of exam room while observing appropriate contact time as indicated for disinfecting solutions.    Alan Rumps, MD [place]

## 2020-05-14 NOTE — Assessment & Plan Note (Signed)
Refer to urology.  ?

## 2020-05-14 NOTE — Assessment & Plan Note (Signed)
Adequate control.  I have asked him to discontinue his amlodipine just as I did at his last visit.  For now he will continue on carvedilol, HCTZ, and olmesartan.  We will see if discontinuing the amlodipine helps with his balance.

## 2020-05-14 NOTE — Patient Instructions (Signed)
Nice to see you. Please stop the amlodipine to see if your BP remains at a better level. We will get a CT scan to look for kidney stone. I have sent in an EpiPen for you to use if you ever have throat tightness in the future.  If you ever have to use that she have to go to the emergency room. If you get to the point where you cannot urinate you need to be evaluated immediately.

## 2020-05-14 NOTE — Assessment & Plan Note (Signed)
Possibly related to a kidney stone versus musculoskeletal cause.  Given his symptoms and his history of kidney stones we will obtain a CT renal stone protocol.  I will also refer her to urology.  He is already on Flomax.

## 2020-05-14 NOTE — Assessment & Plan Note (Signed)
Undetermined cause.  Symptoms resolved in a relatively short period of time.  I am unsure if this was an allergic reaction or related to something else.  Given the potential that this could represent an allergic reaction we will provide him with an EpiPen prescription to keep on hand.  Discussed if he ever has to use this for throat swelling, tongue swelling, lip swelling, or signs of an anaphylactic reaction he would need to be evaluated in the emergency room right away.

## 2020-05-14 NOTE — Assessment & Plan Note (Signed)
Injury to great toe on the right foot appears to be superficial.  No bony tenderness.  He will monitor.

## 2020-05-15 DIAGNOSIS — M47816 Spondylosis without myelopathy or radiculopathy, lumbar region: Secondary | ICD-10-CM | POA: Diagnosis not present

## 2020-05-15 LAB — POCT URINALYSIS DIPSTICK
Bilirubin, UA: NEGATIVE
Blood, UA: NEGATIVE
Glucose, UA: POSITIVE — AB
Leukocytes, UA: NEGATIVE
Nitrite, UA: NEGATIVE
Protein, UA: NEGATIVE
Spec Grav, UA: 1.015 (ref 1.010–1.025)
Urobilinogen, UA: 1 E.U./dL
pH, UA: 7 (ref 5.0–8.0)

## 2020-05-15 NOTE — Addendum Note (Signed)
Addended by: Fulton Mole D on: 05/15/2020 08:33 AM   Modules accepted: Orders

## 2020-05-20 ENCOUNTER — Telehealth: Payer: Self-pay | Admitting: Family Medicine

## 2020-05-20 DIAGNOSIS — M47816 Spondylosis without myelopathy or radiculopathy, lumbar region: Secondary | ICD-10-CM | POA: Diagnosis not present

## 2020-05-20 NOTE — Telephone Encounter (Signed)
Patient was calling for lab results and has questions about his Semaglutide, 1 MG/DOSE, (OZEMPIC, 1 MG/DOSE,) 2 MG/1.5ML SOPN

## 2020-05-21 ENCOUNTER — Other Ambulatory Visit: Payer: Self-pay | Admitting: Family Medicine

## 2020-05-22 ENCOUNTER — Ambulatory Visit: Payer: Medicare Other | Admitting: Urology

## 2020-05-22 NOTE — Telephone Encounter (Signed)
Called and LVM for the patient to call back.  Kalan Rinn,cma

## 2020-05-22 NOTE — Telephone Encounter (Signed)
Pt returned your call.  

## 2020-05-23 ENCOUNTER — Ambulatory Visit: Payer: Medicare Other | Admitting: *Deleted

## 2020-05-23 ENCOUNTER — Telehealth: Payer: Self-pay

## 2020-05-23 DIAGNOSIS — M792 Neuralgia and neuritis, unspecified: Secondary | ICD-10-CM

## 2020-05-23 DIAGNOSIS — F419 Anxiety disorder, unspecified: Secondary | ICD-10-CM

## 2020-05-23 DIAGNOSIS — R1032 Left lower quadrant pain: Secondary | ICD-10-CM

## 2020-05-23 DIAGNOSIS — I10 Essential (primary) hypertension: Secondary | ICD-10-CM

## 2020-05-23 DIAGNOSIS — W19XXXA Unspecified fall, initial encounter: Secondary | ICD-10-CM

## 2020-05-23 DIAGNOSIS — E1142 Type 2 diabetes mellitus with diabetic polyneuropathy: Secondary | ICD-10-CM

## 2020-05-23 DIAGNOSIS — M47816 Spondylosis without myelopathy or radiculopathy, lumbar region: Secondary | ICD-10-CM

## 2020-05-23 DIAGNOSIS — M79604 Pain in right leg: Secondary | ICD-10-CM

## 2020-05-23 DIAGNOSIS — Z794 Long term (current) use of insulin: Secondary | ICD-10-CM

## 2020-05-23 DIAGNOSIS — M5442 Lumbago with sciatica, left side: Secondary | ICD-10-CM

## 2020-05-23 DIAGNOSIS — R2689 Other abnormalities of gait and mobility: Secondary | ICD-10-CM

## 2020-05-23 DIAGNOSIS — E785 Hyperlipidemia, unspecified: Secondary | ICD-10-CM

## 2020-05-23 DIAGNOSIS — F32A Depression, unspecified: Secondary | ICD-10-CM

## 2020-05-23 DIAGNOSIS — G894 Chronic pain syndrome: Secondary | ICD-10-CM

## 2020-05-23 DIAGNOSIS — M542 Cervicalgia: Secondary | ICD-10-CM

## 2020-05-23 DIAGNOSIS — M25511 Pain in right shoulder: Secondary | ICD-10-CM

## 2020-05-23 DIAGNOSIS — G8929 Other chronic pain: Secondary | ICD-10-CM

## 2020-05-23 NOTE — Telephone Encounter (Signed)
LVM for the patient to call me back.  Virlee Stroschein,cma  

## 2020-05-23 NOTE — Telephone Encounter (Signed)
   Telephone encounter was:  Unsuccessful.  05/23/2020 Name: Alan Schmidt MRN: [id number] DOB: 23-Oct-1945  Unsuccessful outbound call made today to assist with:  information for dental providers  Outreach Attempt:  3rd Attempt.  Referral closed unable to contact patient.  A HIPAA compliant voice message was left requesting a return call.  Instructed patient to call back at [phone].  [name], AAS Paralegal, [place] . Embedded Care Coordination [facility]  [address], [place] [address] ??[email or web address]  ?? [phone]   [email or web address]

## 2020-05-23 NOTE — Chronic Care Management (AMB) (Signed)
Chronic Care Management    Clinical Social Work Note  05/23/2020 Name: Alan Schmidt MRN: [id number] DOB: [birth year]  Alan Schmidt is a 75 y.o. year old male who is a primary care patient of [name], [name], MD. The CCM team was consulted to assist the patient with chronic disease management and/or care coordination needs related to: Level of Care Concerns with regards to Balance Problems, History of Falls/Fall Risk, Chronic Pain.   Engaged with patient by telephone for follow up visit in response to provider referral for social work chronic care management and care coordination services.   Consent to Services:  The patient was given information about Chronic Care Management services, agreed to services, and gave verbal consent prior to initiation of services.  Please see initial visit note for detailed documentation.   Patient agreed to services and consent obtained.   Assessment: Review of patient past medical history, allergies, medications, and health status, including review of relevant consultants reports was performed today as part of a comprehensive evaluation and provision of chronic care management and care coordination services.     SDOH (Social Determinants of Health) assessments and interventions performed:    Advanced Directives Status: Not addressed in this encounter.  CCM Care Plan  Allergies  Allergen Reactions  . Contrast Media [Iodinated Diagnostic Agents] Swelling  . Iodine Swelling  . Shellfish Allergy Nausea And Vomiting and Swelling    Outpatient Encounter Medications as of 05/23/2020  Medication Sig Note  . acetaminophen (TYLENOL) 500 MG tablet Take 500 mg by mouth every 6 (six) hours as needed. 06/05/2019: Taking PRN, up to a couple times daily   . ALPRAZolam (XANAX) 1 MG tablet TAKE 0.5 TABLETS BY MOUTH 2 TIMES DAILY AS NEEDED FOR ANXIETY.   Marland Kitchen aspirin EC 81 MG tablet Take 81 mg by mouth daily.   Marland Kitchen buPROPion (WELLBUTRIN XL) 150 MG 24 hr tablet TAKE 1  TABLET (150 MG TOTAL) BY MOUTH DAILY FOR 7 DAYS, THEN 2 TABLETS (300 MG TOTAL) DAILY.   . carvedilol (COREG) 25 MG tablet TAKE 1 TABLET BY MOUTH TWICE A DAY   . celecoxib (CELEBREX) 100 MG capsule TAKE 1 CAPSULE (100 MG TOTAL) BY MOUTH 2 (TWO) TIMES DAILY AS NEEDED FOR MODERATE PAIN.   Marland Kitchen Continuous Blood Gluc Receiver (FREESTYLE LIBRE 14 DAY READER) DEVI 1 Device by Does not apply route daily. Use to scan to check blood sugar up to 7 times daily; E11.42,   . Continuous Blood Gluc Sensor (FREESTYLE LIBRE 14 DAY SENSOR) MISC 1 Device by Does not apply route every 14 (fourteen) days. E11.42   . desoximetasone (TOPICORT) 0.25 % cream APPLY CREAM TO AFFECTED AREA TWO TIMES DAILY, FOR UP TO 7 DAYS, DO NOT APPLY TO FACE   . EPINEPHrine (EPIPEN 2-PAK) 0.3 mg/0.3 mL IJ SOAJ injection Inject 0.3 mg into the muscle as needed for anaphylaxis.   Marland Kitchen escitalopram (LEXAPRO) 20 MG tablet TAKE 1 TABLET BY MOUTH EVERY DAY   . gabapentin (NEURONTIN) 300 MG capsule Take 1 capsule (300 mg total) by mouth at bedtime.   . hydrochlorothiazide (HYDRODIURIL) 25 MG tablet TAKE 1 TABLET BY MOUTH EVERY DAY   . insulin degludec (TRESIBA FLEXTOUCH) 100 UNIT/ML FlexTouch Pen INJECT 18 UNITS INTO THE SKIN DAILY AT 10 PM. 05/03/2020: 12 units daily  . insulin lispro (HUMALOG KWIKPEN) 100 UNIT/ML KwikPen INJECT 5-7 units INTO THE SKIN three times daily with a meal 05/03/2020: 5 units TID with meal  . Insulin Pen Needle (BD  PEN NEEDLE NANO U/F) 32G X 4 MM MISC USE EVERY DAY   . JARDIANCE 25 MG TABS tablet TAKE 1 TABLET BY MOUTH DAILY   . loratadine (CLARITIN) 10 MG tablet TAKE 1 TABLET BY MOUTH EVERY DAY   . lubiprostone (AMITIZA) 8 MCG capsule Take 1 capsule (8 mcg total) by mouth 2 (two) times daily with a meal. Swallow the medication whole. Do not break or chew the medication.   . meloxicam (MOBIC) 15 MG tablet Take 1 tablet (15 mg total) by mouth daily.   . metFORMIN (GLUCOPHAGE) 1000 MG tablet TAKE 1 TABLET BY MOUTH TWICE A DAY WITH  MEALS   . mometasone (NASONEX) 50 MCG/ACT nasal spray INSTILL 2 SPRAYS INTO EACH NOSTRIL ONCE DAILY   . morphine (MS CONTIN) 30 MG 12 hr tablet Take 1 tablet (30 mg total) by mouth every 12 (twelve) hours. Must last 30 days. Do not break tablet   . [START ON 05/25/2020] morphine (MS CONTIN) 30 MG 12 hr tablet Take 1 tablet (30 mg total) by mouth every 12 (twelve) hours. Must last 30 days. Do not break tablet   . [START ON 06/24/2020] morphine (MS CONTIN) 30 MG 12 hr tablet Take 1 tablet (30 mg total) by mouth every 12 (twelve) hours. Must last 30 days. Do not break tablet   . naloxone (NARCAN) 2 MG/2ML injection Inject 1 mL (1 mg total) into the muscle as needed for up to 2 doses (for emergency use only). Always have available. Inject into thigh muscle and call 911   . olmesartan (BENICAR) 40 MG tablet TAKE 1 TABLET BY MOUTH EVERY DAY   . pantoprazole (PROTONIX) 40 MG tablet TAKE 1 TABLET BY MOUTH EVERY DAY   . pravastatin (PRAVACHOL) 40 MG tablet TAKE 1 TABLET BY MOUTH EVERY DAY   . Semaglutide, 1 MG/DOSE, (OZEMPIC, 1 MG/DOSE,) 2 MG/1.5ML SOPN Inject 1 mg into the skin once a week.   . sennosides-docusate sodium (SENOKOT-S) 8.6-50 MG tablet Take 1 tablet by mouth daily as needed.    . tamsulosin (FLOMAX) 0.4 MG CAPS capsule TAKE 1 CAPSULE BY MOUTH 2 TIMES DAILY.   Marland Kitchen Wheat Dextrin (BENEFIBER) POWD Take 6 g by mouth 3 (three) times daily before meals. (2 tsp = 6 g)    No facility-administered encounter medications on file as of 05/23/2020.    Patient Active Problem List   Diagnosis Date Noted  . Throat tightness 05/14/2020  . Left flank pain 05/14/2020  . History of kidney stones 05/14/2020  . Chronic use of opiate for therapeutic purpose 04/24/2020  . Left-sided back pain 03/27/2020  . Excessive gas 03/27/2020  . Balance problem 03/27/2020  . Uncomplicated opioid dependence ([place]) 01/24/2020  . Polypharmacy 01/24/2020  . Chronic low back pain (Bilateral) w/o sciatica 12/14/2019  .  Pharmacologic therapy 05/29/2019  . Disorder of skeletal system 05/29/2019  . Problems influencing health status 05/29/2019  . Fall 05/15/2019  . Skin lesion 05/15/2019  . Colon cancer screening 05/15/2019  . Osteoarthritis involving multiple joints 02/07/2019  . Shortened hamstring muscle 02/07/2019  . Spondylosis without myelopathy or radiculopathy, lumbosacral region 12/27/2018  . History of allergy to shellfish 12/27/2018  . History of allergy to povidone-iodine topical antiseptic 12/27/2018    Class: History of  . Right hip pain 05/03/2018  . 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 Thumb pain (Right) 03/03/2018  . Lesion of skin of foot 01/17/2018  . Chronic pain of both knees  05/18/2017  . Coccygodynia 04/19/2017  . DDD (degenerative disc disease), lumbar 04/19/2017  . Chronic musculoskeletal pain 04/19/2017  . Chronic headache 03/16/2017  . Chronic tension-type headache, intractable 02/24/2017  . Vertigo 02/24/2017  . Lumbar facet joint osteoarthritis (Bilateral) 02/16/2017  . History of allergy to radiographic contrast media 02/16/2017  . Nevus 12/10/2016  . History of postoperative nausea and vomiting 11/24/2016  . Encounter for general adult medical examination with abnormal findings 09/25/2016  . Opioid-induced constipation (OIC) 05/14/2016  . Chronic pain syndrome 02/13/2016  . Chronic shoulder radicular pain (Left) 12/18/2015  . Chronic cervical radicular pain (Left) 12/18/2015  . Disturbance of skin sensation 11/13/2015  . Anxiety and depression 07/17/2015  . Allergic rhinitis 05/22/2015  . History of Vasovagal response to spinal injections 04/30/2015    Class: History of  . BPH (benign prostatic hyperplasia) 04/17/2015  . Diabetes ([place]) 03/19/2015  . Chronic shoulder pain (Bilateral) (R>L) 03/06/2015  . Occipital neuralgia (Left) 03/06/2015  . Cervicogenic headache (Left) 03/06/2015  . Chronic low back pain (1ry area of  Pain) (Bilateral) (R>L) w/ sciatica (Bilateral) 12/05/2014  . Lumbar facet syndrome (Bilateral) (R>L) 12/05/2014  . Lumbar spondylosis 12/05/2014  . Diabetic peripheral neuropathy ([place]) 12/05/2014  . Long term current use of opiate analgesic 12/05/2014  . Long term prescription opiate use 12/05/2014  . Opiate use (60 MME/Day) 12/05/2014  . Opiate dependence ([place]) 12/05/2014  . Encounter for therapeutic drug level monitoring 12/05/2014  . Chronic neck pain (midline over the C7 spinous processes) (L>R) 12/05/2014  . Neurogenic pain 12/05/2014  . Neuropathic pain 12/05/2014  . Contrast dye allergy 12/05/2014  . Chronic lower extremity pain (2ry area of Pain) (Bilateral) (R>L) 12/05/2014  . 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) (Right L5 dermatome) 12/05/2014  . 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 Chronic hip pain (Right) 12/05/2014  . Class I Morbid obesity ([place]) (68% higher incidence of chronic low back pain) 12/05/2014  . Essential hypertension 12/05/2014  . GERD (gastroesophageal reflux disease) 12/05/2014  . Obstructive sleep apnea 12/05/2014  . Hyperlipidemia 12/05/2014  . Chronic kidney disease (CKD) 12/05/2014  . Abnormal nerve conduction studies (severe bilateral lower extremity polyneuropathy) 12/05/2014    Conditions to be addressed/monitored:  Level of Care Concerns with regards to Balance Problems, History of Falls/Fall Risk, Chronic Pain.  Financial Constraints Related to Inability to Pay for In-Home Care Services [place], Limited Social Support, Level of Care Concerns, ADL/IADL Limitations, Social Isolation, Limited Access to Caregiver and Memory Deficits.  Care Plan : LCSW Plan of Care  Updates made by Francis Gaines, LCSW since 05/23/2020 12:00 AM    Problem: Receive Assistance with ADL's by Obtaining Man.   Priority: High    Goal: Receive Assistance with ADL's by Obtaining [place].   Start Date:  04/26/2020  Expected End Date: 06/26/2020  This Visit's Progress: On track  Recent Progress: On track  Priority: High  Note:   Current Barriers:    Patient with Gait Instability, History of Falls/Fall Risk and Chronic Pain needs Support, Education, and Care Coordination to resolve unmet personal care needs.  Patient unable to consistently perform ADL's (Activities of Daily Living) independently and needs assistance and support in order to meet this unmet need.  Currently unable to independently self manage needs related to chronic health conditions.   Limited social support, level of care concerns, social isolation, and limited ability to perform ADL's independently.  Patient requires assistance with completion and submission  of application for [employer] ([place] through [employer].  Clinical Goals: . Over the next 30 days patient will work with LCSW, and [facility] to coordinate care for [facility] and select a personal care service provider. . Over the next 45 to 60 days, patient will have personal care needs met as evident by having PCS Aide in the home assisting with needs.  Clinical Interventions: . Assessed needs, level of care concerns, basic eligibility and provided education on PCS process.  . Faxed completed PCS application to PCP for review and signature. . Provided list of PCS agencies and what to expect with PCS process. Marland Kitchen PCS referral will be faxed to [employer] at [employer] [phone], once completed and signed by PCP. Marland Kitchen LCSW will collaborate with [employer] to verify application is received and processed.  . Other interventions provided: Solution-Focused Strategies, Psychotropic Medication Adherence Assessment, Sleep Hygiene, Problem Solving, Teaching/Coaching Strategies. . Collaboration with [name], MD regarding development and update of comprehensive plan of care as evidenced by provider  attestation and co-signature. Bertram Savin care team collaboration (see longitudinal plan of care). Patient Goals/Self-Care Activities:  . Select 2 to 3 in-home care agencies that you would like to use, from the list of PCS ([place]) providers provided to you, and be prepared to share your agencies of interest with representative from [employer], upon completion of initial telephone assessment. Marland Kitchen Keep the list of PCS ([place]) providers until your assessment is completed by [employer], and confirmation is received that you have been approved for services. . Return calls from [facility] or call them directly, if you have questions # [phone] or # [phone], or if you wish to check the status of your application. . PCS (Personal Care Services) application faxed to [employer] for processing on 05/20/2020. Marland Kitchen Expect a call from a representative with [employer] within the next 10 business days to perform the initial phone assessment and intake interview. Follow Up Plan:   06/07/2020 at 10:00am.      Follow Up Plan: LCSW will follow up with patient by phone on 06/07/2020 at 10:00am.      [name] LCSW Licensed Clinical Social Worker [place]  [phone]

## 2020-05-23 NOTE — Telephone Encounter (Signed)
Patient called back and I answered his questions about his medication and I informed him of his 2 upcoming appointments.  Jeanne Terrance,cma

## 2020-05-23 NOTE — Patient Instructions (Signed)
Visit Information  PATIENT GOALS: Goals Addressed              This Visit's Progress   .  Receive Assistance with ADL's by Obtaining [place]. (pt-stated)   On track     Timeframe:  Short-Term Goal Priority:  High Start Date:   04/26/2020                          Expected End Date:  06/26/2020                  Follow Up Date:  06/07/2020 at 10:00am.  Patient Goals:  . Select 2 to 3 in-home care agencies that you would like to use, from the list of PCS ([place]) providers provided to you, and be prepared to share your agencies of interest with representative from [employer], upon completion of initial telephone assessment. Marland Kitchen Keep the list of PCS ([place]) providers until your assessment is completed by [employer], and confirmation is received that you have been approved for services. . Return calls from [facility], or call them directly, if you have questions # [phone] or # [phone], or if you wish to check the status of your application. . PCS (Personal Care Services) application faxed to [employer] for processing on 05/20/2020. Marland Kitchen Expect a call from a representative with [employer] within the next 10 business days to perform the initial phone assessment and intake interview.           Patient verbalizes understanding of instructions provided today and agrees to view in [place].   Telephone follow up appointment with care management team member scheduled for:  06/07/2020 at 10:00am.  [name] LCSW Licensed Clinical Social Worker [place]  [phone]

## 2020-05-24 ENCOUNTER — Other Ambulatory Visit: Payer: Self-pay | Admitting: Family Medicine

## 2020-05-24 ENCOUNTER — Ambulatory Visit: Payer: Medicare Other | Admitting: Pharmacist

## 2020-05-24 DIAGNOSIS — Z794 Long term (current) use of insulin: Secondary | ICD-10-CM

## 2020-05-24 DIAGNOSIS — I10 Essential (primary) hypertension: Secondary | ICD-10-CM

## 2020-05-24 DIAGNOSIS — F32A Depression, unspecified: Secondary | ICD-10-CM

## 2020-05-24 DIAGNOSIS — E1142 Type 2 diabetes mellitus with diabetic polyneuropathy: Secondary | ICD-10-CM

## 2020-05-24 DIAGNOSIS — E785 Hyperlipidemia, unspecified: Secondary | ICD-10-CM

## 2020-05-24 NOTE — Chronic Care Management (AMB) (Signed)
Chronic Care Management Pharmacy Note  05/24/2020 Name:  Alan Schmidt MRN:  [id number] DOB:  1945/03/18  Subjective: Alan Schmidt is an 75 y.o. year old male who is a primary patient of Alan Schmidt, Alan Adam, MD.  The CCM team was consulted for assistance with disease management and care coordination needs.    Care coordination for device access in response to provider referral for pharmacy case management and/or care coordination services.   Consent to Services:  The patient was given information about Chronic Care Management services, agreed to services, and gave verbal consent prior to initiation of services.  Please see initial visit note for detailed documentation.   Patient Care Team: Alan Haven, MD as PCP - General (Family Medicine) De Hollingshead, [place] as Pharmacist (Pharmacist) Michiel Cowboy, RN as [facility] [place], Maree Erie, LCSW as Social Worker (Licensed Clinical Social Worker)  Recent office visits:  PCP visit 4/12 - urinary issues, throat tightness; kidney stone work up, epi pen provided for possible allergic reaction, hold amlodipine  Recent consult visits: None since our last [facility] visits: None in previous 6 months  Objective:  Lab Results  Component Value Date   CREATININE 1.09 03/22/2020   CREATININE 1.08 05/15/2019   CREATININE 1.04 10/26/2018    Lab Results  Component Value Date   HGBA1C 8.1 (H) 03/22/2020   Last diabetic Eye exam:  Lab Results  Component Value Date/Time   HMDIABEYEEXA Retinopathy (A) 11/10/2018 12:00 AM    Last diabetic Foot exam: No results found for: HMDIABFOOTEX      Component Value Date/Time   CHOL 141 03/22/2020 1053   TRIG 77 03/22/2020 1053   HDL 55 03/22/2020 1053   CHOLHDL 3 10/26/2018 1002   VLDL 25.2 10/26/2018 1002   LDLCALC 71 03/22/2020 1053   LDLDIRECT 53.0 05/15/2019 0920    Hepatic Function Latest Ref Rng & Units 03/22/2020 10/26/2018  09/27/2017  Total Protein 6.0 - 8.3 g/dL 7.2 7.2 7.2  Albumin 3.5 - 5.2 g/dL 4.1 4.0 4.1  AST 0 - 37 U/L 19 23 27   ALT 0 - 53 U/L 26 25 39  Alk Phosphatase 39 - 117 U/L 99 109 82  Total Bilirubin 0.2 - 1.2 mg/dL 0.4 0.3 0.4  Bilirubin, Direct 0.0 - 0.3 mg/dL - - -    Lab Results  Component Value Date/Time   TSH 2.64 09/27/2017 09:09 AM    CBC Latest Ref Rng & Units 11/17/2018 09/27/2017 10/25/2016  WBC 4.0 - 10.5 K/uL 7.8 7.7 9.4  Hemoglobin 13.0 - 17.0 g/dL 14.8 14.1 13.8  Hematocrit 39.0 - 52.0 % 46.4 43.9 41.5  Platelets 150.0 - 400.0 K/uL 250.0 255.0 278    No results found for: VD25OH  Clinical ASCVD: No  The 10-year ASCVD risk score Mikey Bussing DC Jr., et al., 2013) is: 33.9%   Values used to calculate the score:     Age: 80 years     Sex: Male     Is Non-Hispanic African American: Yes     Diabetic: Yes     Tobacco smoker: No     Systolic Blood Pressure: 710 mmHg     Is BP treated: Yes     HDL Cholesterol: 55 mg/dL     Total Cholesterol: 141 mg/dL     Social History   Tobacco Use  Smoking Status Former Smoker  . Types: Cigarettes  Smokeless Tobacco Never Used   BP Readings from Last  3 Encounters:  05/14/20 130/80  04/24/20 128/83  03/26/20 90/60   Pulse Readings from Last 3 Encounters:  05/14/20 88  04/24/20 87  03/26/20 96   Wt Readings from Last 3 Encounters:  05/14/20 240 lb 12.8 oz (109.2 kg)  04/24/20 248 lb (112.5 kg)  03/26/20 248 lb 6.4 oz (112.7 kg)    Assessment: Review of patient past medical history, allergies, medications, health status, including review of consultants reports, laboratory and other test data, was performed as part of comprehensive evaluation and provision of chronic care management services.   SDOH:  (Social Determinants of Health) assessments and interventions performed: none today   CCM Care Plan  Allergies  Allergen Reactions  . Contrast Media [Iodinated Diagnostic Agents] Swelling  . Iodine Swelling  . Shellfish  Allergy Nausea And Vomiting and Swelling    Medications Reviewed Today    Reviewed by Francis Gaines, LCSW (Social Worker) on 05/23/20 at 1104  Med List Status: <None>  Medication Order Taking? Sig Documenting Provider Last Dose Status Informant  acetaminophen (TYLENOL) 500 MG tablet [id number] No Take 500 mg by mouth every 6 (six) hours as needed. [provider] Taking Active            Med Note Mayo Ao Jun 05, 2019 10:14 AM) Taking PRN, up to a couple times daily   ALPRAZolam (XANAX) 1 MG tablet [id number] No TAKE 0.5 TABLETS BY MOUTH 2 TIMES DAILY AS NEEDED FOR ANXIETY. Alan Haven, MD Taking Active   aspirin EC 81 MG tablet [id number] No Take 81 mg by mouth daily. [provider] Taking Active   buPROPion (WELLBUTRIN XL) 150 MG 24 hr tablet [id number] No TAKE 1 TABLET (150 MG TOTAL) BY MOUTH DAILY FOR 7 DAYS, THEN 2 TABLETS (300 MG TOTAL) DAILY. Alan Haven, MD Taking Active   carvedilol (COREG) 25 MG tablet [id number] No TAKE 1 TABLET BY MOUTH TWICE A DAY Alan Haven, MD Taking Active   celecoxib (CELEBREX) 100 MG capsule [id number] No TAKE 1 CAPSULE (100 MG TOTAL) BY MOUTH 2 (TWO) TIMES DAILY AS NEEDED FOR MODERATE PAIN. Alan Haven, MD Taking Active   Continuous Blood Gluc Receiver (FREESTYLE LIBRE 14 DAY READER) [place] [id number] No 1 Device by Does not apply route daily. Use to scan to check blood sugar up to 7 times daily; E11.42, Alan Haven, MD Taking Active   Continuous Blood Gluc Sensor (FREESTYLE LIBRE 14 DAY SENSOR) [place] [id number] No 1 Device by Does not apply route every 14 (fourteen) days. E11.42 Alan Haven, MD Taking Active   desoximetasone (TOPICORT) 0.25 % cream [id number] No APPLY CREAM TO AFFECTED AREA TWO TIMES DAILY, FOR UP TO 7 DAYS, DO NOT APPLY TO FACE Alan Schmidt Alan Adam, MD Taking Active   EPINEPHrine (EPIPEN 2-PAK) 0.3 mg/0.3 mL IJ SOAJ injection [id number]  Inject 0.3 mg into the muscle as needed  for anaphylaxis. Alan Haven, MD  Active   escitalopram (LEXAPRO) 20 MG tablet [id number] No TAKE 1 TABLET BY MOUTH EVERY DAY Alan Haven, MD Taking Active   gabapentin (NEURONTIN) 300 MG capsule [id number] No Take 1 capsule (300 mg total) by mouth at bedtime. Alan Pointer, MD Taking Expired 04/26/20 2359   hydrochlorothiazide (HYDRODIURIL) 25 MG tablet [id number] No TAKE 1 TABLET BY MOUTH EVERY DAY Alan Haven, MD Taking Active   insulin degludec (TRESIBA FLEXTOUCH) 100 UNIT/ML FlexTouch Pen [id number] No INJECT 18 UNITS INTO THE SKIN DAILY AT  10 PM. Alan Haven, MD Taking Active            Med Note Darnelle Maffucci, Arville Lime   Fri May 03, 2020 11:19 AM) 12 units daily  insulin lispro (HUMALOG KWIKPEN) 100 UNIT/ML KwikPen [id number] No INJECT 5-7 units INTO THE SKIN three times daily with a meal Alan Haven, MD Taking Active            Med Note Darnelle Maffucci, Arville Lime   Fri May 03, 2020 11:20 AM) 5 units TID with meal  Insulin Pen Needle (BD PEN NEEDLE NANO U/F) 32G X 4 MM MISC [id number] No USE EVERY DAY Alan Haven, MD Taking Active   JARDIANCE 25 MG TABS tablet [id number]  TAKE 1 TABLET BY MOUTH DAILY Alan Haven, MD  Active   loratadine (CLARITIN) 10 MG tablet [id number] No TAKE 1 TABLET BY MOUTH EVERY DAY Alan Haven, MD Taking Active   lubiprostone (AMITIZA) 8 MCG capsule [id number] No Take 1 capsule (8 mcg total) by mouth 2 (two) times daily with a meal. Swallow the medication whole. Do not break or chew the medication. Alan Pointer, MD Taking Expired 03/13/20 2359   meloxicam (MOBIC) 15 MG tablet [id number] No Take 1 tablet (15 mg total) by mouth daily. Alan Pointer, MD Taking Expired 03/13/20 2359   metFORMIN (GLUCOPHAGE) 1000 MG tablet [id number] No TAKE 1 TABLET BY MOUTH TWICE A DAY WITH MEALS Alan Haven, MD Taking Active   mometasone (NASONEX) 50 MCG/ACT nasal spray [id number]  INSTILL 2 SPRAYS INTO EACH NOSTRIL ONCE DAILY  Alan Haven, MD  Active   morphine (MS CONTIN) 30 MG 12 hr tablet [id number] No Take 1 tablet (30 mg total) by mouth every 12 (twelve) hours. Must last 30 days. Do not break tablet Alan Pointer, MD Taking Active   morphine (MS CONTIN) 30 MG 12 hr tablet [id number] No Take 1 tablet (30 mg total) by mouth every 12 (twelve) hours. Must last 30 days. Do not break tablet Alan Pointer, MD Taking Active   morphine (MS CONTIN) 30 MG 12 hr tablet [id number] No Take 1 tablet (30 mg total) by mouth every 12 (twelve) hours. Must last 30 days. Do not break tablet Alan Pointer, MD Taking Active   naloxone [facility]) 2 MG/2ML injection [id number] No Inject 1 mL (1 mg total) into the muscle as needed for up to 2 doses (for emergency use only). Always have available. Inject into thigh muscle and call [place] Alan Pointer, MD Taking Active   olmesartan (BENICAR) 40 MG tablet [id number] No TAKE 1 TABLET BY MOUTH EVERY DAY Alan Haven, MD Taking Active   pantoprazole (PROTONIX) 40 MG tablet [id number] No TAKE 1 TABLET BY MOUTH EVERY DAY Alan Haven, MD Taking Active   pravastatin (PRAVACHOL) 40 MG tablet [id number] No TAKE 1 TABLET BY MOUTH EVERY DAY Alan Haven, MD Taking Active   Semaglutide, 1 MG/DOSE, (OZEMPIC, 1 MG/DOSE,) 2 MG/1.5ML SOPN [id number] No Inject 1 mg into the skin once a week. Alan Haven, MD Taking Active   sennosides-docusate sodium (SENOKOT-S) 8.6-50 MG tablet [id number] No Take 1 tablet by mouth daily as needed.  [provider] Taking Active   tamsulosin (FLOMAX) 0.4 MG CAPS capsule [id number] No TAKE 1 CAPSULE BY MOUTH 2 TIMES DAILY. Alan Haven, MD Taking Active   Wheat Dextrin [facility]) POWD [id number] No Take 6 g by mouth 3 (three) times daily before meals. (2 tsp = 6 g) Alan Pointer,  MD Taking Expired 03/13/20 2359   Med List Note Janett Billow, RN 04/24/20 1506): UDS 04/24/20 MR 07/25/19          Patient Active  Problem List   Diagnosis Date Noted  . Throat tightness 05/14/2020  . Left flank pain 05/14/2020  . History of kidney stones 05/14/2020  . Chronic use of opiate for therapeutic purpose 04/24/2020  . Left-sided back pain 03/27/2020  . Excessive gas 03/27/2020  . Balance problem 03/27/2020  . Uncomplicated opioid dependence ([place]) 01/24/2020  . Polypharmacy 01/24/2020  . Chronic low back pain (Bilateral) w/o sciatica 12/14/2019  . Pharmacologic therapy 05/29/2019  . Disorder of skeletal system 05/29/2019  . Problems influencing health status 05/29/2019  . Fall 05/15/2019  . Skin lesion 05/15/2019  . Colon cancer screening 05/15/2019  . Osteoarthritis involving multiple joints 02/07/2019  . Shortened hamstring muscle 02/07/2019  . Spondylosis without myelopathy or radiculopathy, lumbosacral region 12/27/2018  . History of allergy to shellfish 12/27/2018  . History of allergy to povidone-iodine topical antiseptic 12/27/2018    Class: History of  . Right hip pain 05/03/2018  . 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 Thumb pain (Right) 03/03/2018  . Lesion of skin of foot 01/17/2018  . Chronic pain of both knees 05/18/2017  . Coccygodynia 04/19/2017  . DDD (degenerative disc disease), lumbar 04/19/2017  . Chronic musculoskeletal pain 04/19/2017  . Chronic headache 03/16/2017  . Chronic tension-type headache, intractable 02/24/2017  . Vertigo 02/24/2017  . Lumbar facet joint osteoarthritis (Bilateral) 02/16/2017  . History of allergy to radiographic contrast media 02/16/2017  . Nevus 12/10/2016  . History of postoperative nausea and vomiting 11/24/2016  . Encounter for general adult medical examination with abnormal findings 09/25/2016  . Opioid-induced constipation (OIC) 05/14/2016  . Chronic pain syndrome 02/13/2016  . Chronic shoulder radicular pain (Left) 12/18/2015  . Chronic cervical radicular pain (Left) 12/18/2015  . Disturbance of skin  sensation 11/13/2015  . Anxiety and depression 07/17/2015  . Allergic rhinitis 05/22/2015  . History of Vasovagal response to spinal injections 04/30/2015    Class: History of  . BPH (benign prostatic hyperplasia) 04/17/2015  . Diabetes ([place]) 03/19/2015  . Chronic shoulder pain (Bilateral) (R>L) 03/06/2015  . Occipital neuralgia (Left) 03/06/2015  . Cervicogenic headache (Left) 03/06/2015  . Chronic low back pain (1ry area of Pain) (Bilateral) (R>L) w/ sciatica (Bilateral) 12/05/2014  . Lumbar facet syndrome (Bilateral) (R>L) 12/05/2014  . Lumbar spondylosis 12/05/2014  . Diabetic peripheral neuropathy ([place]) 12/05/2014  . Long term current use of opiate analgesic 12/05/2014  . Long term prescription opiate use 12/05/2014  . Opiate use (60 MME/Day) 12/05/2014  . Opiate dependence ([place]) 12/05/2014  . Encounter for therapeutic drug level monitoring 12/05/2014  . Chronic neck pain (midline over the C7 spinous processes) (L>R) 12/05/2014  . Neurogenic pain 12/05/2014  . Neuropathic pain 12/05/2014  . Contrast dye allergy 12/05/2014  . Chronic lower extremity pain (2ry area of Pain) (Bilateral) (R>L) 12/05/2014  . 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) (Right L5 dermatome) 12/05/2014  . 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 Chronic hip pain (Right) 12/05/2014  . Class I Morbid obesity ([place]) (68% higher incidence of chronic low back pain) 12/05/2014  . Essential hypertension 12/05/2014  . GERD (gastroesophageal reflux disease) 12/05/2014  . Obstructive sleep apnea 12/05/2014  . Hyperlipidemia 12/05/2014  . Chronic kidney disease (CKD) 12/05/2014  . Abnormal nerve conduction studies (severe bilateral lower extremity polyneuropathy)  12/05/2014    Immunization History  Administered Date(s) Administered  . Fluad Quad(high Dose 65+) 10/26/2018  . Influenza, High Dose Seasonal PF 11/21/2015, 10/27/2016, 11/06/2017, 11/24/2019  . Moderna Sars-Covid-2 Vaccination  03/23/2019, 04/20/2019, 11/24/2019, 05/02/2020  . Pneumococcal Conjugate-13 11/21/2015  . Pneumococcal Polysaccharide-23 10/21/2017  . Tdap 04/09/2015    Conditions to be addressed/monitored: HTN, HLD and DMII  Care Plan : Medication Management  Updates made by De Hollingshead, RPH-CPP since 05/24/2020 12:00 AM    Problem: Diabetes, Hypertension, Hyperlipidemia     Long-Range Goal: Disease Prevention Progression   Start Date: 05/24/2020  This Visit's Progress: On track  Priority: High  Note:   Current Barriers:   Unable to achieve control of diabetes    Pharmacist Clinical Goal(s):   Over the next 90 days, patient will achieve control of diabetes as evidenced by A1c through collaboration with PharmD and provider.    Interventions:  1:1 collaboration with Alan Haven, MD regarding development and update of comprehensive plan of care as evidenced by provider attestation and co-signature  Inter-disciplinary care team collaboration (see longitudinal plan of care)  Comprehensive medication review performed; medication list updated in electronic medical record   Diabetes:  Uncontrolled, but Libre glucose readings more controlled than A1c; current treatment: metformin 1000 mg BID, Jardiance 25 mg daily, Ozempic 1 mg, Tresiba 10 units daily, Humalog on hold with Ozempic transition  Contacted [facility] to follow up on needs regarding refills for [place] device. They note they have all information needed from our office, and are awaiting insurance verification. Will follow moving forward.  Hypertension:  Controlled per previous office visits; current treatment: amlodipine 5 mg daily, carvedilol 25 mg BID, HCTZ 25 mg daily, olmesartan 40 mg daily;  Recommended to continue current regimen at this time   Hyperlipidemia:  Controlled per last lipid panel; current treatment: pravastatin 40 mg daily  Recommended to continue current regimen at this time    Depression/Anxiety/Chronic Pain:  Uncontrolled pain but moderate mood; current treatment: escitalopram 20 mg daily, alprazolam 0.5 mg PRN; bupropion XL 300 mg daily; morphine ER 30 mg BID; celecoxib 100 mg BID PRN, acetaminophen 500 mg PRN, gabapentin 300 mg QPM; follows w/ Pain Management Dr. Consuela Mimes   Constipation: Amitiza 8 mcg BID - reports this does not adequately treat constipation. Using OTC senna/docusate and fiber as well.   Previously encouraged to discuss constipation w/ Dr. Consuela Mimes at next appointment.   Continue collaboration with PCP, pain management, physical therapy as above.      Patient Goals/Self-Care Activities  Over the next 90 days, patient will:  - take medications as prescribed check glucose at least three times daily using CGM, document, and provide at future appointments check blood pressure periodically, document, and provide at future appointments collaborate with provider on medication access solutions   Follow Up Plan: Telephone follow up appointment with care management team member scheduled for:  ~3 weeks as previously scheduled     Medication Assistance: None required.  Patient affirms current coverage meets needs.  Patient's preferred pharmacy is:  [id number] [place] [address]Phone: [phone]: [phone] [id number] [address] [facility]Phone: [phone]: [phone] Follow Up:  Patient agrees to Care Plan and Follow-up.  Plan: Telephone follow up appointment with care management team member scheduled for:  ~  3 weeks as previously scheduled  [name], PharmD, [place], [place] Clinical Pharmacist [employer] at [place]

## 2020-05-24 NOTE — Patient Instructions (Signed)
Visit Information  PATIENT GOALS: Goals Addressed              This Visit's Progress     Patient Stated   .  Medication Monitoring (pt-stated)        Patient Goals/Self-Care Activities . Over the next 90 days, patient will:  - take medications as prescribed check glucose at least three times daily using CGM, document, and provide at future appointments check blood pressure periodically, document, and provide at future appointments collaborate with provider on medication access solutions        Patient verbalizes understanding of instructions provided today and agrees to view in [place].   Plan: Telephone follow up appointment with care management team member scheduled for:  ~ 3 weeks as previously scheduled  [name], PharmD, [place], [place] Clinical Pharmacist [employer] at [employer] [phone]

## 2020-05-29 ENCOUNTER — Other Ambulatory Visit: Payer: Self-pay

## 2020-05-29 ENCOUNTER — Ambulatory Visit
Admission: RE | Admit: 2020-05-29 | Discharge: 2020-05-29 | Disposition: A | Discharge status: Home / Self Care | Payer: Medicare Other | Source: Ambulatory Visit | Attending: Family Medicine | Admitting: Family Medicine

## 2020-05-29 DIAGNOSIS — K5730 Diverticulosis of large intestine without perforation or abscess without bleeding: Secondary | ICD-10-CM | POA: Diagnosis not present

## 2020-05-29 DIAGNOSIS — Z87442 Personal history of urinary calculi: Secondary | ICD-10-CM | POA: Diagnosis not present

## 2020-05-29 DIAGNOSIS — Z794 Long term (current) use of insulin: Secondary | ICD-10-CM | POA: Diagnosis not present

## 2020-05-29 DIAGNOSIS — R1032 Left lower quadrant pain: Secondary | ICD-10-CM | POA: Insufficient documentation

## 2020-05-29 DIAGNOSIS — N3289 Other specified disorders of bladder: Secondary | ICD-10-CM | POA: Diagnosis not present

## 2020-05-29 DIAGNOSIS — E118 Type 2 diabetes mellitus with unspecified complications: Secondary | ICD-10-CM | POA: Diagnosis not present

## 2020-05-30 ENCOUNTER — Encounter: Payer: Self-pay | Admitting: Urology

## 2020-05-30 ENCOUNTER — Ambulatory Visit (INDEPENDENT_AMBULATORY_CARE_PROVIDER_SITE_OTHER): Payer: Medicare Other | Admitting: Urology

## 2020-05-30 VITALS — BP 100/66 | HR 91 | Ht 73.0 in | Wt 232.0 lb

## 2020-05-30 DIAGNOSIS — M47816 Spondylosis without myelopathy or radiculopathy, lumbar region: Secondary | ICD-10-CM | POA: Diagnosis not present

## 2020-05-30 DIAGNOSIS — Z7689 Persons encountering health services in other specified circumstances: Secondary | ICD-10-CM | POA: Diagnosis not present

## 2020-05-30 DIAGNOSIS — Z87442 Personal history of urinary calculi: Secondary | ICD-10-CM

## 2020-05-30 DIAGNOSIS — R1084 Generalized abdominal pain: Secondary | ICD-10-CM | POA: Diagnosis not present

## 2020-05-30 NOTE — Progress Notes (Signed)
05/30/2020 4:38 PM   Alan Schmidt 05/02/1945 [id number]  Referring provider: [name], MD [address] [place],  [place] [address]  Chief Complaint  Patient presents with  . Nephrolithiasis    HPI: 75 year old male who presents today for further evaluation of flank pain.  He does have a personal history of kidney stones.  He reports that he has been having intermittent episodes of left flank pain.  In addition to this, he an episode Reah some difficulty voiding which was transient and now has resolved.  He reports that this is consistent with his previous stone episodes.  He had a noncontrast CT scan ordered by his primary care, Dr. [name] yesterday.  This shows a calcification possibly within his bladder which is small, approximate 3 mm just to the right of midline.  This may correlate with past stone.  Is no other ureteral calculi or hydronephrosis.  His pain is ongoing, constant in his left lower quadrant extending from his ASIS down to his left suprapubic area.  No specific alleviating or exacerbating symptoms although does worsen with movement at times.  His urinalysis today is unremarkable, no evidence of blood or infection.  See epic for details.   PMH: Past Medical History:  Diagnosis Date  . Acute postoperative pain 11/24/2016  . Anxiety   . Chronic hip pain (Right) 12/05/2014  . Chronic lumbar pain   . Depression   . Diabetes mellitus without complication ([place])   . Hyperlipidemia   . Hypertension   . Kidney stones   . Migraines     Surgical History: Past Surgical History:  Procedure Laterality Date  . right hip surgery     4 surgeries  . TONSILLECTOMY    . TOTAL HIP ARTHROPLASTY      Home Medications:  Allergies as of 05/30/2020      Reactions   Contrast Media [iodinated Diagnostic Agents] Swelling   Iodine Swelling   Shellfish Allergy Nausea And Vomiting, Swelling      Medication List       Accurate as of May 30, 2020  4:38 PM. If you have any questions, ask your nurse or doctor.        acetaminophen 500 MG tablet Commonly known as: TYLENOL Take 500 mg by mouth every 6 (six) hours as needed.   ALPRAZolam 1 MG tablet Commonly known as: XANAX TAKE 0.5 TABLETS BY MOUTH 2 TIMES DAILY AS NEEDED FOR ANXIETY.   aspirin EC 81 MG tablet Take 81 mg by mouth daily.   BD Pen Needle Nano U/F 32G X 4 MM Misc Generic drug: Insulin Pen Needle USE EVERY DAY   Benefiber Powd Take 6 g by mouth 3 (three) times daily before meals. (2 tsp = 6 g)   buPROPion 150 MG 24 hr tablet Commonly known as: WELLBUTRIN XL TAKE 1 TABLET (150 MG TOTAL) BY MOUTH DAILY FOR 7 DAYS, THEN 2 TABLETS (300 MG TOTAL) DAILY. Start taking on: November 21, 2019   carvedilol 25 MG tablet Commonly known as: COREG TAKE 1 TABLET BY MOUTH TWICE A DAY   celecoxib 100 MG capsule Commonly known as: CELEBREX TAKE 1 CAPSULE (100 MG TOTAL) BY MOUTH 2 (TWO) TIMES DAILY AS NEEDED FOR MODERATE PAIN.   desoximetasone 0.25 % cream Commonly known as: TOPICORT APPLY CREAM TO AFFECTED AREA TWO TIMES DAILY, FOR UP TO 7 DAYS, DO NOT APPLY TO FACE   EPINEPHrine 0.3 mg/0.3 mL Soaj injection Commonly known as: EpiPen 2-Pak  Inject 0.3 mg into the muscle as needed for anaphylaxis.   escitalopram 20 MG tablet Commonly known as: LEXAPRO TAKE 1 TABLET BY MOUTH EVERY DAY   FreeStyle Libre 14 Day Reader [facility] 1 Device by Does not apply route daily. Use to scan to check blood sugar up to 7 times daily; E11.42,   FreeStyle Libre 14 Day Sensor Misc 1 Device by Does not apply route every 14 (fourteen) days. E11.42   gabapentin 300 MG capsule Commonly known as: Neurontin Take 1 capsule (300 mg total) by mouth at bedtime.   hydrochlorothiazide 25 MG tablet Commonly known as: HYDRODIURIL TAKE 1 TABLET BY MOUTH EVERY DAY   insulin lispro 100 UNIT/ML KwikPen Commonly known as: HumaLOG KwikPen INJECT 5-7 units INTO THE SKIN three times daily with a  meal   Jardiance 25 MG Tabs tablet Generic drug: empagliflozin TAKE 1 TABLET BY MOUTH DAILY   loratadine 10 MG tablet Commonly known as: CLARITIN TAKE 1 TABLET BY MOUTH EVERY DAY   lubiprostone 8 MCG capsule Commonly known as: AMITIZA Take 1 capsule (8 mcg total) by mouth 2 (two) times daily with a meal. Swallow the medication whole. Do not break or chew the medication.   meloxicam 15 MG tablet Commonly known as: MOBIC Take 1 tablet (15 mg total) by mouth daily.   metFORMIN 1000 MG tablet Commonly known as: GLUCOPHAGE TAKE 1 TABLET BY MOUTH TWICE A DAY WITH MEALS   mometasone 50 MCG/ACT nasal spray Commonly known as: NASONEX INSTILL 2 SPRAYS INTO EACH NOSTRIL ONCE DAILY   morphine 30 MG 12 hr tablet Commonly known as: MS CONTIN Take 1 tablet (30 mg total) by mouth every 12 (twelve) hours. Must last 30 days. Do not break tablet   morphine 30 MG 12 hr tablet Commonly known as: MS CONTIN Take 1 tablet (30 mg total) by mouth every 12 (twelve) hours. Must last 30 days. Do not break tablet   morphine 30 MG 12 hr tablet Commonly known as: MS CONTIN Take 1 tablet (30 mg total) by mouth every 12 (twelve) hours. Must last 30 days. Do not break tablet Start taking on: Jun 24, 2020   naloxone 2 MG/2ML injection Commonly known as: NARCAN Inject 1 mL (1 mg total) into the muscle as needed for up to 2 doses (for emergency use only). Always have available. Inject into thigh muscle and call 911   olmesartan 40 MG tablet Commonly known as: BENICAR TAKE 1 TABLET BY MOUTH EVERY DAY   Ozempic (1 MG/DOSE) 2 MG/1.5ML Sopn Generic drug: Semaglutide (1 MG/DOSE) Inject 1 mg into the skin once a week.   pantoprazole 40 MG tablet Commonly known as: PROTONIX TAKE 1 TABLET BY MOUTH EVERY DAY   pravastatin 40 MG tablet Commonly known as: PRAVACHOL TAKE 1 TABLET BY MOUTH EVERY DAY   sennosides-docusate sodium 8.6-50 MG tablet Commonly known as: SENOKOT-S Take 1 tablet by mouth daily as  needed.   tamsulosin 0.4 MG Caps capsule Commonly known as: FLOMAX TAKE 1 CAPSULE BY MOUTH 2 TIMES DAILY.   Tyler Aas FlexTouch 100 UNIT/ML FlexTouch Pen Generic drug: insulin degludec INJECT 18 UNITS INTO THE SKIN DAILY AT 10 PM.       Allergies:  Allergies  Allergen Reactions  . Contrast Media [Iodinated Diagnostic Agents] Swelling  . Iodine Swelling  . Shellfish Allergy Nausea And Vomiting and Swelling    Family History: Family History  Problem Relation Age of Onset  . Cancer Mother   . Heart disease Father   .  Stroke Father   . Diabetes Father   . Diabetes Sister   . Diabetes Sister     Social History:  reports that he has quit smoking. His smoking use included cigarettes. He has never used smokeless tobacco. He reports that he does not drink alcohol and does not use drugs.   Physical Exam: BP 100/66   Pulse 91   Ht 6\' 1"  (1.854 m)   Wt 232 lb (105.2 kg)   BMI 30.61 kg/m   Constitutional:  Alert and oriented, No acute distress.  In wheelchair, extremely pleasant. HEENT: [place] AT, moist mucus membranes.  Trachea midline, no masses. Cardiovascular: No clubbing, cyanosis, or edema. Respiratory: Normal respiratory effort, no increased work of breathing. Skin: No rashes, bruises or suspicious lesions. Neurologic: Grossly intact, no focal deficits, moving all 4 extremities. Psychiatric: Normal mood and affect.  Laboratory Data: Lab Results  Component Value Date   WBC 7.8 11/17/2018   HGB 14.8 11/17/2018   HCT 46.4 11/17/2018   MCV 87.8 11/17/2018   PLT 250.0 11/17/2018    Lab Results  Component Value Date   CREATININE 1.09 03/22/2020     Urinalysis Urinalysis today is negative, no evidence of blood or any other abnormalities, see epic  Pertinent Imaging: Narrative & Impression  CLINICAL DATA:  Left lower quadrant abdominal pain. History of nephrolithiasis.  EXAM: CT ABDOMEN AND PELVIS WITHOUT CONTRAST  TECHNIQUE: Multidetector CT imaging of the  abdomen and pelvis was performed following the standard protocol without IV contrast.  COMPARISON:  10/25/2016 CT abdomen/pelvis.  FINDINGS: Lower chest: No significant pulmonary nodules or acute consolidative airspace disease. Coronary atherosclerosis.  Hepatobiliary: Normal liver size. Scattered punctate granulomatous calcifications in the liver are unchanged. No liver masses. Normal gallbladder with no radiopaque cholelithiasis. No biliary ductal dilatation.  Pancreas: Normal, with no mass or duct dilation.  Spleen: Normal size spleen. Granulomatous splenic calcifications. No splenic masses.  Adrenals/Urinary Tract: Normal adrenals. No renal stones. No hydronephrosis. Simple bilateral renal cysts, largest 3.6 cm in the upper left kidney and 2.9 cm in the posterior interpolar right kidney. Normal caliber ureters. No ureteral stones, noting poor visualization of the pelvic ureters due to streak artifact from right hip hardware. Bladder obscured by streak artifact. A 3 mm calcification projecting over the posterior bladder just to the right of midline (series 2/image 84), not previously visualized on 10/25/2016 CT, cannot exclude a layering bladder stone.  Stomach/Bowel: Normal non-distended stomach. Normal caliber small bowel with no small bowel wall thickening. Normal appendix. Minimal scattered left colonic diverticulosis with no large bowel wall thickening or significant pericolonic fat stranding.  Vascular/Lymphatic: Atherosclerotic nonaneurysmal abdominal aorta. No pathologically enlarged lymph nodes in the abdomen or pelvis.  Reproductive: Mildly enlarged prostate.  Other: No pneumoperitoneum, ascites or focal fluid collection.  Musculoskeletal: No aggressive appearing focal osseous lesions. Right total hip arthroplasty. Marked lumbar spondylosis.  IMPRESSION: 1. Limited pelvic visualization due to streak artifact from right hip hardware. A 3 mm  calcification projecting over the posterior bladder just to the right of midline, not previously visualized on 10/25/2016 CT, cannot exclude a layering bladder stone. No hydronephrosis. No renal collecting system or ureteral stones. 2. Minimal left colonic diverticulosis. 3. Mildly enlarged prostate. 4. Coronary atherosclerosis. 5. Aortic Atherosclerosis (ICD10-I70.0).   Electronically Signed   By: Ilona Sorrel M.D.   On: 05/30/2020 16:17    CT scan was personally reviewed.  Agree with radiologic interpretation.  There is no evidence of intrarenal or ureteral calculi.  No  evidence of obstruction.  No clear explanation for his discomfort.  Assessment & Plan:    1. History of kidney stones Above CT scan was reviewed, there is no evidence of obstructing ureteral or renal calculi  There is question of a possible stone in the bladder although there is significant distortion from hip artifact, regardless, the stone would be unlikely cause of his ongoing pain.  It may represent interval passed stone and should pass spontaneously if it does in fact represent a passed ureteral fragment.  - Urinalysis, Complete  2. Left abdominal/ flank pain Suspect either interval passage of stone versus musculoskeletal pain possibly referred pain from his hip especially given his history  Advise follow-up as needed, will CC Dr. [name] - CT RENAL STONE STUDY; Future   Hollice Espy, MD  [facility] [address] [place], [place] [address] [phone]

## 2020-05-31 ENCOUNTER — Telehealth: Payer: Self-pay | Admitting: Family Medicine

## 2020-05-31 ENCOUNTER — Telehealth: Payer: Self-pay

## 2020-05-31 LAB — MICROSCOPIC EXAMINATION: Bacteria, UA: NONE SEEN

## 2020-05-31 LAB — URINALYSIS, COMPLETE
Bilirubin, UA: NEGATIVE
Ketones, UA: NEGATIVE
Leukocytes,UA: NEGATIVE
Nitrite, UA: NEGATIVE
Protein,UA: NEGATIVE
RBC, UA: NEGATIVE
Specific Gravity, UA: 1.015 (ref 1.005–1.030)
Urobilinogen, Ur: 0.2 mg/dL (ref 0.2–1.0)
pH, UA: 5 (ref 5.0–7.5)

## 2020-05-31 NOTE — Telephone Encounter (Signed)
I called the patient and LVM for the patient to call back and ask for Alan Schmidt.  Carolee Channell,cma

## 2020-05-31 NOTE — Telephone Encounter (Signed)
LVM for the patient to call back. Alan Schmidt,cma  

## 2020-05-31 NOTE — Telephone Encounter (Signed)
Please let the patient know I heard from the urologist he saw.  They did not feel as though his pain was related to a kidney stone.  It looks like he has follow-up with me in about a month.  Would he like to come in sooner if possible to discuss his pain again?

## 2020-05-31 NOTE — Telephone Encounter (Signed)
Patient called office back to speak with [name]. 

## 2020-05-31 NOTE — Telephone Encounter (Signed)
Patient called back and I scheduled him for next week for his pain.  Burrell Hodapp,cma

## 2020-05-31 NOTE — Telephone Encounter (Signed)
-----   Message from [name], MD sent at 05/31/2020 12:55 PM EDT ----- Please let the patient know that his CT scan did not reveal a cause for his symptoms.  Please see the phone message I sent earlier.  Thanks.

## 2020-05-31 NOTE — Telephone Encounter (Signed)
-----   Message from Hollice Espy, MD sent at 05/30/2020  5:50 PM EDT ----- Thank you for referring this lovely patient.  I do not see any obvious kidney or ureteral stones although it is possible he may have passed a small stone as there is a questionable calcification in his bladder.  This is not the explanation for his ongoing discomfort.  I told Mr.  Alan Schmidt I would follow up with you at his request!    Hollice Espy, MD

## 2020-06-03 DIAGNOSIS — M47816 Spondylosis without myelopathy or radiculopathy, lumbar region: Secondary | ICD-10-CM | POA: Diagnosis not present

## 2020-06-04 ENCOUNTER — Encounter: Payer: Self-pay | Admitting: Family Medicine

## 2020-06-04 ENCOUNTER — Ambulatory Visit (INDEPENDENT_AMBULATORY_CARE_PROVIDER_SITE_OTHER): Payer: Medicare Other | Admitting: Family Medicine

## 2020-06-04 ENCOUNTER — Other Ambulatory Visit: Payer: Self-pay

## 2020-06-04 DIAGNOSIS — R109 Unspecified abdominal pain: Secondary | ICD-10-CM | POA: Diagnosis not present

## 2020-06-04 DIAGNOSIS — R10A2 Flank pain, left side: Secondary | ICD-10-CM

## 2020-06-04 NOTE — Progress Notes (Signed)
[name], MD Phone: [phone]  Alan Schmidt is a 75 y.o. male who presents today for f/u.  Left side pain: This has actually improved recently over the last several days.  He had a CT scan that revealed a likely stone in his bladder and then he saw urology who did not feel as though the stone was contributing to his symptoms because his symptoms persisted despite the stone being in his bladder.  He has been taking Advil 800 mg twice daily and his chronic morphine.  He also takes gabapentin 900 mg nightly which is prescribed by his pain specialist.  He describes the prior pain as a gnawing pain and when it was present it was fairly significant at a 7 out of 10.  There was no radiation.  He notes no pain currently.  Social History   Tobacco Use  Smoking Status Former Smoker  . Types: Cigarettes  Smokeless Tobacco Never Used    Current Outpatient Medications on File Prior to Visit  Medication Sig Dispense Refill  . acetaminophen (TYLENOL) 500 MG tablet Take 500 mg by mouth every 6 (six) hours as needed.    . ALPRAZolam (XANAX) 1 MG tablet TAKE 0.5 TABLETS BY MOUTH 2 TIMES DAILY AS NEEDED FOR ANXIETY. 30 tablet 0  . aspirin EC 81 MG tablet Take 81 mg by mouth daily.    . carvedilol (COREG) 25 MG tablet TAKE 1 TABLET BY MOUTH TWICE A DAY 60 tablet 5  . celecoxib (CELEBREX) 100 MG capsule TAKE 1 CAPSULE (100 MG TOTAL) BY MOUTH 2 (TWO) TIMES DAILY AS NEEDED FOR MODERATE PAIN. 60 capsule 0  . Continuous Blood Gluc Receiver (FREESTYLE LIBRE 14 DAY READER) DEVI 1 Device by Does not apply route daily. Use to scan to check blood sugar up to 7 times daily; E11.42, 1 Device 1  . Continuous Blood Gluc Sensor (FREESTYLE LIBRE 14 DAY SENSOR) MISC 1 Device by Does not apply route every 14 (fourteen) days. E11.42 6 each 3  . desoximetasone (TOPICORT) 0.25 % cream APPLY CREAM TO AFFECTED AREA TWO TIMES DAILY, FOR UP TO 7 DAYS, DO NOT APPLY TO FACE 30 g 0  . EPINEPHrine (EPIPEN 2-PAK) 0.3 mg/0.3  mL IJ SOAJ injection Inject 0.3 mg into the muscle as needed for anaphylaxis. 2 each 0  . escitalopram (LEXAPRO) 20 MG tablet TAKE 1 TABLET BY MOUTH EVERY DAY 90 tablet 1  . hydrochlorothiazide (HYDRODIURIL) 25 MG tablet TAKE 1 TABLET BY MOUTH EVERY DAY 30 tablet 5  . insulin degludec (TRESIBA FLEXTOUCH) 100 UNIT/ML FlexTouch Pen INJECT 18 UNITS INTO THE SKIN DAILY AT 10 PM. 15 mL 3  . insulin lispro (HUMALOG KWIKPEN) 100 UNIT/ML KwikPen INJECT 5-7 units INTO THE SKIN three times daily with a meal 15 mL 2  . Insulin Pen Needle (BD PEN NEEDLE NANO U/F) 32G X 4 MM MISC USE EVERY DAY 100 each 4  . JARDIANCE 25 MG TABS tablet TAKE 1 TABLET BY MOUTH DAILY 30 tablet 3  . loratadine (CLARITIN) 10 MG tablet TAKE 1 TABLET BY MOUTH EVERY DAY 30 tablet 11  . metFORMIN (GLUCOPHAGE) 1000 MG tablet TAKE 1 TABLET BY MOUTH TWICE A DAY WITH MEALS 60 tablet 5  . mometasone (NASONEX) 50 MCG/ACT nasal spray INSTILL 2 SPRAYS INTO EACH NOSTRIL ONCE DAILY 17 each 3  . morphine (MS CONTIN) 30 MG 12 hr tablet Take 1 tablet (30 mg total) by mouth every 12 (twelve) hours. Must last 30 days. Do not break tablet  60 tablet 0  . [START ON 06/24/2020] morphine (MS CONTIN) 30 MG 12 hr tablet Take 1 tablet (30 mg total) by mouth every 12 (twelve) hours. Must last 30 days. Do not break tablet 60 tablet 0  . naloxone (NARCAN) 2 MG/2ML injection Inject 1 mL (1 mg total) into the muscle as needed for up to 2 doses (for emergency use only). Always have available. Inject into thigh muscle and call 911 2 mL 1  . olmesartan (BENICAR) 40 MG tablet TAKE 1 TABLET BY MOUTH EVERY DAY 30 tablet 5  . pantoprazole (PROTONIX) 40 MG tablet TAKE 1 TABLET BY MOUTH EVERY DAY 30 tablet 5  . pravastatin (PRAVACHOL) 40 MG tablet TAKE 1 TABLET BY MOUTH EVERY DAY 30 tablet 5  . Semaglutide, 1 MG/DOSE, (OZEMPIC, 1 MG/DOSE,) 2 MG/1.5ML SOPN Inject 1 mg into the skin once a week. 4.5 mL 1  . sennosides-docusate sodium (SENOKOT-S) 8.6-50 MG tablet Take 1 tablet  by mouth daily as needed.     . tamsulosin (FLOMAX) 0.4 MG CAPS capsule TAKE 1 CAPSULE BY MOUTH 2 TIMES DAILY. 60 capsule 8  . buPROPion (WELLBUTRIN XL) 150 MG 24 hr tablet TAKE 1 TABLET (150 MG TOTAL) BY MOUTH DAILY FOR 7 DAYS, THEN 2 TABLETS (300 MG TOTAL) DAILY. 180 tablet 1  . gabapentin (NEURONTIN) 300 MG capsule Take 1 capsule (300 mg total) by mouth at bedtime. 90 capsule 0  . lubiprostone (AMITIZA) 8 MCG capsule Take 1 capsule (8 mcg total) by mouth 2 (two) times daily with a meal. Swallow the medication whole. Do not break or chew the medication. 60 capsule 2  . meloxicam (MOBIC) 15 MG tablet Take 1 tablet (15 mg total) by mouth daily. 30 tablet 2  . morphine (MS CONTIN) 30 MG 12 hr tablet Take 1 tablet (30 mg total) by mouth every 12 (twelve) hours. Must last 30 days. Do not break tablet 60 tablet 0  . Wheat Dextrin (BENEFIBER) POWD Take 6 g by mouth 3 (three) times daily before meals. (2 tsp = 6 g) 730 g 2   No current facility-administered medications on file prior to visit.     ROS see history of present illness  Objective  Physical Exam Vitals:   06/04/20 0917  BP: 130/70  Pulse: 83  Temp: 97.8 F (36.6 C)  SpO2: 94%    BP Readings from Last 3 Encounters:  06/04/20 130/70  05/30/20 100/66  05/14/20 130/80   Wt Readings from Last 3 Encounters:  06/04/20 239 lb (108.4 kg)  05/30/20 232 lb (105.2 kg)  05/14/20 240 lb 12.8 oz (109.2 kg)    Physical Exam Constitutional:      General: He is not in acute distress.    Appearance: He is not diaphoretic.  Pulmonary:     Effort: Pulmonary effort is normal.  Abdominal:     General: There is no distension.     Palpations: Abdomen is soft.     Tenderness: There is no abdominal tenderness. There is no guarding or rebound.  Musculoskeletal:     Comments: No midline spine tenderness, no midline spine step-off, no muscular back tenderness, no left side or flank tenderness  Neurological:     Mental Status: He is alert.       Assessment/Plan: Please see individual problem list.  Problem List Items Addressed This Visit    Left flank pain    Unlikely that this is related to the stone that was seen in his bladder.  I suspect this was related to a muscular strain or a back issue.  His symptoms today have resolved.  Discussed monitoring for recurrence.  I advised him that I would send a message to his pain specialist regarding transitioning from gabapentin capsules to gabapentin liquid at the patient's request.  The patient will let us know if he has recurrent pain.  I also advised that if he develops blood in his urine or any worsening pain he should let us know.         This visit occurred during the SARS-CoV-2 public health emergency.  Safety protocols were in place, including screening questions prior to the visit, additional usage of staff PPE, and extensive cleaning of exam room while observing appropriate contact time as indicated for disinfecting solutions.    [name], MD [place]

## 2020-06-04 NOTE — Assessment & Plan Note (Addendum)
Unlikely that this is related to the stone that was seen in his bladder.  I suspect this was related to a muscular strain or a back issue.  His symptoms today have resolved.  Discussed monitoring for recurrence.  I advised him that I would send a message to his pain specialist regarding transitioning from gabapentin capsules to gabapentin liquid at the patient's request.  The patient will let us know if he has recurrent pain.  I also advised that if he develops blood in his urine or any worsening pain he should let us know.

## 2020-06-04 NOTE — Patient Instructions (Addendum)
Nice to see you. I will let you know what Dr. Dossie Arbour says about your gabapentin.

## 2020-06-06 ENCOUNTER — Ambulatory Visit (INDEPENDENT_AMBULATORY_CARE_PROVIDER_SITE_OTHER): Payer: Medicare Other | Admitting: Pharmacist

## 2020-06-06 ENCOUNTER — Telehealth: Payer: Self-pay | Admitting: Pharmacist

## 2020-06-06 ENCOUNTER — Other Ambulatory Visit: Payer: Self-pay | Admitting: Family Medicine

## 2020-06-06 DIAGNOSIS — G894 Chronic pain syndrome: Secondary | ICD-10-CM

## 2020-06-06 DIAGNOSIS — Z794 Long term (current) use of insulin: Secondary | ICD-10-CM

## 2020-06-06 DIAGNOSIS — E1142 Type 2 diabetes mellitus with diabetic polyneuropathy: Secondary | ICD-10-CM

## 2020-06-06 DIAGNOSIS — F32A Depression, unspecified: Secondary | ICD-10-CM

## 2020-06-06 DIAGNOSIS — E785 Hyperlipidemia, unspecified: Secondary | ICD-10-CM

## 2020-06-06 NOTE — Telephone Encounter (Signed)
RX Refill:xanax Last Seen:06-04-20 Last ordered:04-12-20

## 2020-06-06 NOTE — Patient Instructions (Addendum)
  [name],   It was great talking to you today!  Increase Tresiba to 12 units daily. Continue metformin 1000 mg twice daily, Ozempic 1 mg weekly, Jardiance 25 mg daily.   Call me if you have any questions or concerns!  [name], PharmD [phone]  Visit Information PATIENT GOALS: Goals Addressed              This Visit's Progress     Patient Stated   .  Medication Monitoring (pt-stated)        Patient Goals/Self-Care Activities . Over the next 90 days, patient will:  - take medications as prescribed check glucose at least three times daily using CGM, document, and provide at future appointments check blood pressure periodically, document, and provide at future appointments collaborate with provider on medication access solutions       The patient verbalized understanding of instructions, educational materials, and care plan provided today and agreed to receive a mailed copy of patient instructions, educational materials, and care plan.   Plan: Telephone follow up appointment with care management team member scheduled for:  ~ 7 weeks  [name], PharmD, [place], [place] Clinical Pharmacist [employer] [facility] [employer] [phone]

## 2020-06-06 NOTE — Telephone Encounter (Signed)
Patient returned call. See CCM

## 2020-06-06 NOTE — Chronic Care Management (AMB) (Signed)
Chronic Care Management Pharmacy Note  06/06/2020 Name:  Alan Schmidt MRN:  [id number] DOB:  1945/05/06  Subjective: Alan Schmidt is an 75 y.o. year old male who is a primary patient of [name], [name], MD.  The CCM team was consulted for assistance with disease management and care coordination needs.    Engaged with patient by telephone for follow up visit in response to provider referral for pharmacy case management and/or care coordination services.   Consent to Services:  The patient was given information about Chronic Care Management services, agreed to services, and gave verbal consent prior to initiation of services.  Please see initial visit note for detailed documentation.   Patient Care Team: [name], MD as PCP - General (Family Medicine) De Hollingshead, [place] as Pharmacist (Pharmacist) Michiel Cowboy, RN as [place] Management [place], Maree Erie, LCSW as Social Worker (Licensed Clinical Social Worker)  Recent office visits:  5/3 - PCP - L flank pain but improved, consideration of transitioning to liquid gabapentin  Recent consult visits:  4/28 - urology Erlene Quan - L flank pain; no evidence of obstructing stone causing pain  Hospital visits: None in previous 6 months  Objective:  Lab Results  Component Value Date   CREATININE 1.09 03/22/2020   CREATININE 1.08 05/15/2019   CREATININE 1.04 10/26/2018    Lab Results  Component Value Date   HGBA1C 8.1 (H) 03/22/2020   Last diabetic Eye exam:  Lab Results  Component Value Date/Time   HMDIABEYEEXA Retinopathy (A) 11/10/2018 12:00 AM    Last diabetic Foot exam: No results found for: HMDIABFOOTEX      Component Value Date/Time   CHOL 141 03/22/2020 1053   TRIG 77 03/22/2020 1053   HDL 55 03/22/2020 1053   CHOLHDL 3 10/26/2018 1002   VLDL 25.2 10/26/2018 1002   LDLCALC 71 03/22/2020 1053   LDLDIRECT 53.0 05/15/2019 0920    Hepatic Function Latest Ref Rng &  Units 03/22/2020 10/26/2018 09/27/2017  Total Protein 6.0 - 8.3 g/dL 7.2 7.2 7.2  Albumin 3.5 - 5.2 g/dL 4.1 4.0 4.1  AST 0 - 37 U/L 19 23 27   ALT 0 - 53 U/L 26 25 39  Alk Phosphatase 39 - 117 U/L 99 109 82  Total Bilirubin 0.2 - 1.2 mg/dL 0.4 0.3 0.4  Bilirubin, Direct 0.0 - 0.3 mg/dL - - -    Lab Results  Component Value Date/Time   TSH 2.64 09/27/2017 09:09 AM    CBC Latest Ref Rng & Units 11/17/2018 09/27/2017 10/25/2016  WBC 4.0 - 10.5 K/uL 7.8 7.7 9.4  Hemoglobin 13.0 - 17.0 g/dL 14.8 14.1 13.8  Hematocrit 39.0 - 52.0 % 46.4 43.9 41.5  Platelets 150.0 - 400.0 K/uL 250.0 255.0 278       Clinical ASCVD: No  The 10-year ASCVD risk score Mikey Bussing DC Jr., et al., 2013) is: 33.9%   Values used to calculate the score:     Age: 47 years     Sex: Male     Is Non-Hispanic African American: Yes     Diabetic: Yes     Tobacco smoker: No     Systolic Blood Pressure: 333 mmHg     Is BP treated: Yes     HDL Cholesterol: 55 mg/dL     Total Cholesterol: 141 mg/dL    Other: (CHADS2VASc if Afib, PHQ9 if depression, MMRC or CAT for COPD, ACT, DEXA)  Social History   Tobacco Use  Smoking Status Former Smoker  . Types: Cigarettes  Smokeless Tobacco Never Used   BP Readings from Last 3 Encounters:  06/04/20 130/70  05/30/20 100/66  05/14/20 130/80   Pulse Readings from Last 3 Encounters:  06/04/20 83  05/30/20 91  05/14/20 88   Wt Readings from Last 3 Encounters:  06/04/20 239 lb (108.4 kg)  05/30/20 232 lb (105.2 kg)  05/14/20 240 lb 12.8 oz (109.2 kg)    Assessment: Review of patient past medical history, allergies, medications, health status, including review of consultants reports, laboratory and other test data, was performed as part of comprehensive evaluation and provision of chronic care management services.   SDOH:  (Social Determinants of Health) assessments and interventions performed:  SDOH Interventions   Flowsheet Row Most Recent Value  SDOH Interventions    Financial Strain Interventions Intervention Not Indicated      CCM Care Plan  Allergies  Allergen Reactions  . Contrast Media [Iodinated Diagnostic Agents] Swelling  . Iodine Swelling  . Shellfish Allergy Nausea And Vomiting and Swelling    Medications Reviewed Today    Reviewed by De Hollingshead, RPH-CPP (Pharmacist) on 06/06/20 at 1244  Med List Status: <None>  Medication Order Taking? Sig Documenting Provider Last Dose Status Informant  acetaminophen (TYLENOL) 500 MG tablet [id number]  Take 500 mg by mouth every 6 (six) hours as needed. [provider]  Active            Med Note Mayo Ao Jun 05, 2019 10:14 AM) Taking PRN, up to a couple times daily   ALPRAZolam (XANAX) 1 MG tablet [id number]  TAKE 0.5 TABLETS BY MOUTH 2 TIMES DAILY AS NEEDED FOR ANXIETY. [name], MD  Active   aspirin EC 81 MG tablet [id number] Yes Take 81 mg by mouth daily. [provider] Taking Active   buPROPion (WELLBUTRIN XL) 150 MG 24 hr tablet [id number] No TAKE 1 TABLET (150 MG TOTAL) BY MOUTH DAILY FOR 7 DAYS, THEN 2 TABLETS (300 MG TOTAL) DAILY.  Patient not taking: Reported on 06/06/2020   [name], MD Not Taking Active   carvedilol (COREG) 25 MG tablet [id number] Yes TAKE 1 TABLET BY MOUTH TWICE A DAY [name], MD Taking Active   celecoxib (CELEBREX) 100 MG capsule [id number] Yes TAKE 1 CAPSULE (100 MG TOTAL) BY MOUTH 2 (TWO) TIMES DAILY AS NEEDED FOR MODERATE PAIN. [name], MD Taking Active   Continuous Blood Gluc Receiver (FREESTYLE LIBRE 14 DAY READER) [place] [id number] Yes 1 Device by Does not apply route daily. Use to scan to check blood sugar up to 7 times daily; E11.42, [name], MD Taking Active   Continuous Blood Gluc Sensor (FREESTYLE LIBRE [place]) [place] [id number] Yes 1 Device by Does not apply route every 14 (fourteen) days. E11.42 [name], MD Taking Active   desoximetasone (TOPICORT) 0.25 % cream  [id number]  APPLY CREAM TO AFFECTED AREA TWO TIMES DAILY, FOR UP TO 7 DAYS, DO NOT APPLY TO FACE [name], MD  Active   EPINEPHrine (EPIPEN 2-PAK) 0.3 mg/0.3 mL IJ SOAJ injection [id number]  Inject 0.3 mg into the muscle as needed for anaphylaxis. [name], MD  Active   escitalopram (LEXAPRO) 20 MG tablet [id number] Yes TAKE 1 TABLET BY MOUTH EVERY DAY [name], MD Taking Active   gabapentin (NEURONTIN) 300 MG capsule [id number] Yes Take 1 capsule (300 mg total) by mouth at bedtime. Dossie Arbour,  Francisco, MD Taking Expired 04/26/20 2359   hydrochlorothiazide (HYDRODIURIL) 25 MG tablet [id number] Yes TAKE 1 TABLET BY MOUTH EVERY DAY [name], MD Taking Active   insulin degludec (TRESIBA FLEXTOUCH) 100 UNIT/ML FlexTouch Pen [id number] Yes INJECT 18 UNITS INTO THE SKIN DAILY AT 10 PM. [name], MD Taking Active            Med Note [name] Jun 06, 2020 12:42 PM) Taking 10 units daily   insulin lispro (HUMALOG KWIKPEN) 100 UNIT/ML KwikPen [id number] No INJECT 5-7 units INTO THE SKIN three times daily with a meal  Patient not taking: Reported on 06/06/2020   [name], MD Not Taking Active            Med Note [name] Jun 06, 2020 12:42 PM)    Insulin Pen Needle (BD PEN NEEDLE NANO U/F) 32G X 4 MM MISC [id number]  USE EVERY DAY [name], MD  Active   JARDIANCE 25 MG TABS tablet [id number] Yes TAKE 1 TABLET BY MOUTH DAILY [name], MD Taking Active   loratadine (CLARITIN) 10 MG tablet [id number] Yes TAKE 1 TABLET BY MOUTH EVERY DAY [name], MD Taking Active   lubiprostone (AMITIZA) 8 MCG capsule [id number]  Take 1 capsule (8 mcg total) by mouth 2 (two) times daily with a meal. Swallow the medication whole. Do not break or chew the medication. Milinda Pointer, MD  Expired 03/13/20 2359   meloxicam (MOBIC) 15 MG tablet [id number]  Take 1 tablet (15 mg total) by mouth daily. Milinda Pointer, MD   Active   metFORMIN (GLUCOPHAGE) 1000 MG tablet [id number] Yes TAKE 1 TABLET BY MOUTH TWICE A DAY WITH MEALS [name], MD Taking Active   mometasone (NASONEX) 50 MCG/ACT nasal spray [id number] Yes INSTILL 2 SPRAYS INTO EACH NOSTRIL ONCE DAILY [name], MD Taking Active   morphine (MS CONTIN) 30 MG 12 hr tablet [id number]  Take 1 tablet (30 mg total) by mouth every 12 (twelve) hours. Must last 30 days. Do not break tablet Milinda Pointer, MD  Expired 05/25/20 2359   morphine (MS CONTIN) 30 MG 12 hr tablet [id number] Yes Take 1 tablet (30 mg total) by mouth every 12 (twelve) hours. Must last 30 days. Do not break tablet Milinda Pointer, MD Taking Active   morphine (MS CONTIN) 30 MG 12 hr tablet [id number]  Take 1 tablet (30 mg total) by mouth every 12 (twelve) hours. Must last 30 days. Do not break tablet Milinda Pointer, MD  Active   naloxone [facility]) 2 MG/2ML injection [id number] No Inject 1 mL (1 mg total) into the muscle as needed for up to 2 doses (for emergency use only). Always have available. Inject into thigh muscle and call 911  Patient not taking: Reported on 06/06/2020   Milinda Pointer, MD Not Taking Active   olmesartan (BENICAR) 40 MG tablet [id number] Yes TAKE 1 TABLET BY MOUTH EVERY DAY [name], MD Taking Active   pantoprazole (PROTONIX) 40 MG tablet [id number] Yes TAKE 1 TABLET BY MOUTH EVERY DAY [name], MD Taking Active   pravastatin (PRAVACHOL) 40 MG tablet [id number] Yes TAKE 1 TABLET BY MOUTH EVERY DAY [name], MD Taking Active   Semaglutide, 1 MG/DOSE, (OZEMPIC, 1 MG/DOSE,) 2 MG/1.5ML SOPN [id number] Yes Inject 1 mg into the skin once a week. [name], MD Taking Active   sennosides-docusate sodium (SENOKOT-S) 8.6-50 MG  tablet [id number]  Take 1 tablet by mouth daily as needed.  [provider]  Active   tamsulosin (FLOMAX) 0.4 MG CAPS capsule [id number] Yes TAKE 1 CAPSULE BY MOUTH 2 TIMES DAILY. [name], MD Taking Active   Wheat Dextrin [facility]) POWD [id number]  Take 6 g by mouth 3 (three) times daily before meals. (2 tsp = 6 g) Milinda Pointer, MD  Expired 03/13/20 2359   Med List Note Janett Billow, [place] 04/24/20 1506): UDS 04/24/20 MR 07/25/19          Patient Active Problem List   Diagnosis Date Noted  . Throat tightness 05/14/2020  . Left flank pain 05/14/2020  . History of kidney stones 05/14/2020  . Chronic use of opiate for therapeutic purpose 04/24/2020  . Left-sided back pain 03/27/2020  . Excessive gas 03/27/2020  . Balance problem 03/27/2020  . Uncomplicated opioid dependence ([place]) 01/24/2020  . Polypharmacy 01/24/2020  . Chronic low back pain (Bilateral) w/o sciatica 12/14/2019  . Pharmacologic therapy 05/29/2019  . Disorder of skeletal system 05/29/2019  . Problems influencing health status 05/29/2019  . Fall 05/15/2019  . Skin lesion 05/15/2019  . Colon cancer screening 05/15/2019  . Osteoarthritis involving multiple joints 02/07/2019  . Shortened hamstring muscle 02/07/2019  . Spondylosis without myelopathy or radiculopathy, lumbosacral region 12/27/2018  . History of allergy to shellfish 12/27/2018  . History of allergy to povidone-iodine topical antiseptic 12/27/2018    Class: History of  . Right hip pain 05/03/2018  . 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 Thumb pain (Right) 03/03/2018  . Lesion of skin of foot 01/17/2018  . Chronic pain of both knees 05/18/2017  . Coccygodynia 04/19/2017  . DDD (degenerative disc disease), lumbar 04/19/2017  . Chronic musculoskeletal pain 04/19/2017  . Chronic headache 03/16/2017  . Chronic tension-type headache, intractable 02/24/2017  . Vertigo 02/24/2017  . Lumbar facet joint osteoarthritis (Bilateral) 02/16/2017  . History of allergy to radiographic contrast media 02/16/2017  . Nevus 12/10/2016  . History of postoperative nausea and vomiting 11/24/2016  .  Encounter for general adult medical examination with abnormal findings 09/25/2016  . Opioid-induced constipation (OIC) 05/14/2016  . Chronic pain syndrome 02/13/2016  . Chronic shoulder radicular pain (Left) 12/18/2015  . Chronic cervical radicular pain (Left) 12/18/2015  . Disturbance of skin sensation 11/13/2015  . Anxiety and depression 07/17/2015  . Allergic rhinitis 05/22/2015  . History of Vasovagal response to spinal injections 04/30/2015    Class: History of  . BPH (benign prostatic hyperplasia) 04/17/2015  . Diabetes ([place]) 03/19/2015  . Chronic shoulder pain (Bilateral) (R>L) 03/06/2015  . Occipital neuralgia (Left) 03/06/2015  . Cervicogenic headache (Left) 03/06/2015  . Chronic low back pain (1ry area of Pain) (Bilateral) (R>L) w/ sciatica (Bilateral) 12/05/2014  . Lumbar facet syndrome (Bilateral) (R>L) 12/05/2014  . Lumbar spondylosis 12/05/2014  . Diabetic peripheral neuropathy ([place]) 12/05/2014  . Long term current use of opiate analgesic 12/05/2014  . Long term prescription opiate use 12/05/2014  . Opiate use (60 MME/Day) 12/05/2014  . Opiate dependence ([place]) 12/05/2014  . Encounter for therapeutic drug level monitoring 12/05/2014  . Chronic neck pain (midline over the C7 spinous processes) (L>R) 12/05/2014  . Neurogenic pain 12/05/2014  . Neuropathic pain 12/05/2014  . Contrast dye allergy 12/05/2014  . Chronic lower extremity pain (2ry area of Pain) (Bilateral) (R>L) 12/05/2014  . 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) (Right L5 dermatome) 12/05/2014  . History of total  hip replacement (Right) 12/05/2014  . Chronic hip pain (Right) 12/05/2014  . Class I Morbid obesity ([place]) (68% higher incidence of chronic low back pain) 12/05/2014  . Essential hypertension 12/05/2014  . GERD (gastroesophageal reflux disease) 12/05/2014  . Obstructive sleep apnea 12/05/2014  . Hyperlipidemia 12/05/2014  . Chronic kidney disease (CKD) 12/05/2014  . Abnormal nerve  conduction studies (severe bilateral lower extremity polyneuropathy) 12/05/2014    Immunization History  Administered Date(s) Administered  . Fluad Quad(high Dose 65+) 10/26/2018  . Influenza, High Dose Seasonal PF 11/21/2015, 10/27/2016, 11/06/2017, 11/24/2019  . Moderna Sars-Covid-2 Vaccination 03/23/2019, 04/20/2019, 11/24/2019, 05/02/2020  . Pneumococcal Conjugate-13 11/21/2015  . Pneumococcal Polysaccharide-23 10/21/2017  . Tdap 04/09/2015    Conditions to be addressed/monitored: HTN, HLD and DMII  Care Plan : PharmD Medication Management  Updates made by De Hollingshead, RPH-CPP since 06/06/2020 12:00 AM    Problem: Diabetes, Hypertension, Hyperlipidemia     Long-Range Goal: Disease Prevention Progression   Start Date: 05/24/2020  This Visit's Progress: On track  Recent Progress: On track  Priority: High  Note:   Current Barriers:   Unable to achieve control of diabetes    Pharmacist Clinical Goal(s):   Over the next 90 days, patient will achieve control of diabetes as evidenced by A1c through collaboration with PharmD and provider.    Interventions:  1:1 collaboration with [name], MD regarding development and update of comprehensive plan of care as evidenced by provider attestation and co-signature  Inter-disciplinary care team collaboration (see longitudinal plan of care)  Comprehensive medication review performed; medication list updated in electronic medical record  SDOH: . Reports his PCS application was approved and someone is coming this afternoon to help clean his kitchen.  . Reports he attempted to call Maili back about food/dental resources but was unable to get in touch. Provided her phone number and encouraged him to call and leave a message . Notes he really enjoys working with his physical therapist, it seems to have provided significant benefit with pain. Flank pain improved from when he last saw Dr. [name]    Medication Management: . Frances Maywood his medications packaged in [facility].   Diabetes:  Uncontrolled, but improved; current treatment: metformin 1000 mg BID, Jardiance 25 mg daily, Ozempic 1 mg, Tresiba 10 units daily, Humalog on hold with Ozempic transition  Current glucose readings: using Libre 14 day. Notes he is using an old phone without a sim card because his new iphone does not have an updated enough iOS to run the [employer]. Data is still transmitting to [place] Date of Download: 4/22-06/05/20 % Time CGM is active: 34% Average Glucose: 151 mg/dL Glucose Management Indicator: n/a Glucose Variability: 17.6 (goal <36%) Time in Goal:  - Time in range 70-180: 14% - Time above range: 86% - Time below range: 0% Observed patterns: Not enough data for full interpretation.  Increase Tresiba to 12 units daily. Continue metformin 1000 mg BID, Jardiance 25 mg daily, Ozempic 1 mg weekly. Continue to hold Humalog at this time.   Discussed importance of scanning glucose at least three times daily. Patient verbalized understanding  Hypertension:  Controlled per previous office visits; current treatment: amlodipine 5 mg daily, carvedilol 25 mg BID, HCTZ 25 mg daily, olmesartan 40 mg daily;  Home BP readings: did not have a chance to discuss today  Recommended to continue current regimen at this time. Follow w/ RN health coach   Hyperlipidemia:  Controlled per last lipid panel; current  treatment: pravastatin 40 mg daily  Recommended to continue current regimen at this time   Depression/Anxiety/Chronic Pain:  Uncontrolled pain but moderate mood; current treatment: escitalopram 20 mg daily, alprazolam 0.5 mg PRN; morphine ER 30 mg BID; celecoxib 100 mg BID PRN, acetaminophen 500 mg PRN, gabapentin 300 mg QPM; follows w/ Pain Management Dr. Consuela Mimes   Constipation: Amitiza 8 mcg BID - reports this does not adequately treat constipation, this prescription is expired. Using OTC  senna/docusate and fiber.  Per last documentation with Dr. [name], patient plans to discuss liquid gabapentin with Dr. Consuela Mimes at next appointment.   Continue collaboration with PCP, pain management, physical therapy as above.    Patient Goals/Self-Care Activities  Over the next 90 days, patient will:  - take medications as prescribed check glucose at least three times daily using CGM, document, and provide at future appointments check blood pressure periodically, document, and provide at future appointments collaborate with provider on medication access solutions   Follow Up Plan: Telephone follow up appointment with care management team member scheduled for:  ~6 weeks as previously scheduled     Medication Assistance: None required.  Patient affirms current coverage meets needs.  [id number] [place] [place]Phone: [phone]: [phone] [id number] [address] [facility]Phone: [phone]: [phone] Follow Up:  Patient agrees to Care Plan and Follow-up.  Plan: Telephone follow up appointment with care management team member scheduled for:  ~ 7 weeks  [name] TDarnelle Maffucci PharmD, [place]keytownClinical Pharmacist LOccidental Petroleumat BJohnson [employer] [phone]

## 2020-06-06 NOTE — Telephone Encounter (Signed)
  Chronic Care Management   Note  06/06/2020 Name: ADIEN KIMMEL MRN: [id number] DOB: [birth year]   Attempted to contact patient for scheduled appointment for medication management support. Left HIPAA compliant message for patient to return my call at their convenience.    Plan: - If I do not hear back from the patient by end of business today, will collaborate with Care Guide to outreach to schedule follow up with me  [name], PharmD, [place], [place] Pharmacist [employer] at [employer] [phone]

## 2020-06-07 ENCOUNTER — Ambulatory Visit: Payer: Medicare Other | Admitting: *Deleted

## 2020-06-07 DIAGNOSIS — E785 Hyperlipidemia, unspecified: Secondary | ICD-10-CM | POA: Diagnosis not present

## 2020-06-07 DIAGNOSIS — E1142 Type 2 diabetes mellitus with diabetic polyneuropathy: Secondary | ICD-10-CM | POA: Diagnosis not present

## 2020-06-07 DIAGNOSIS — Z794 Long term (current) use of insulin: Secondary | ICD-10-CM

## 2020-06-07 DIAGNOSIS — G894 Chronic pain syndrome: Secondary | ICD-10-CM

## 2020-06-07 DIAGNOSIS — M79605 Pain in left leg: Secondary | ICD-10-CM

## 2020-06-07 DIAGNOSIS — R10A2 Flank pain, left side: Secondary | ICD-10-CM

## 2020-06-07 DIAGNOSIS — M5442 Lumbago with sciatica, left side: Secondary | ICD-10-CM

## 2020-06-07 DIAGNOSIS — M47816 Spondylosis without myelopathy or radiculopathy, lumbar region: Secondary | ICD-10-CM

## 2020-06-07 DIAGNOSIS — R109 Unspecified abdominal pain: Secondary | ICD-10-CM

## 2020-06-07 DIAGNOSIS — G8929 Other chronic pain: Secondary | ICD-10-CM

## 2020-06-07 DIAGNOSIS — M25551 Pain in right hip: Secondary | ICD-10-CM

## 2020-06-07 DIAGNOSIS — F419 Anxiety disorder, unspecified: Secondary | ICD-10-CM

## 2020-06-07 DIAGNOSIS — F32A Depression, unspecified: Secondary | ICD-10-CM | POA: Diagnosis not present

## 2020-06-07 DIAGNOSIS — M792 Neuralgia and neuritis, unspecified: Secondary | ICD-10-CM

## 2020-06-07 DIAGNOSIS — Z96641 Presence of right artificial hip joint: Secondary | ICD-10-CM

## 2020-06-07 NOTE — Patient Instructions (Signed)
Visit Information  PATIENT GOALS: Goals Addressed              This Visit's Progress   .  COMPLETED: Receive Assistance with ADL's by Obtaining [place]. (pt-stated)   On track     Timeframe:  Short-Term Goal Priority:  High Start Date:   04/26/2020                          Expected End Date:  06/07/2020                  Follow Up Date:  No Follow-Up Required.  Patient Goals:  . Continue to receive PCS ([place]) through [employer], approved for 10 hours of in-home care services per week. . Contact LCSW directly if additional social work needs arise in the near future, contact number provided.       Patient verbalizes understanding of instructions provided today and agrees to view in [place].   [name] LCSW Licensed Clinical Social Worker [place]  [phone]

## 2020-06-07 NOTE — Chronic Care Management (AMB) (Signed)
Chronic Care Management    Clinical Social Work Note  06/07/2020 Name: Alan Schmidt MRN: [id number] DOB: 05-16-1945  Alan Schmidt is a 75 y.o. year old male who is a primary care patient of [name], [name], MD. The CCM team was consulted to assist the patient with chronic disease management and/or care coordination needs related to: Level of Care Concerns in Patient with Balance Problems, History of Falls/Fall Risk, Chronic Pain.   Engaged with patient by telephone for follow up visit in response to provider referral for social work chronic care management and care coordination services.   Consent to Services:  The patient was given information about Chronic Care Management services, agreed to services, and gave verbal consent prior to initiation of services.  Please see initial visit note for detailed documentation.   Patient agreed to services and consent obtained.   Assessment: Review of patient past medical history, allergies, medications, and health status, including review of relevant consultants reports was performed today as part of a comprehensive evaluation and provision of chronic care management and care coordination services.     SDOH (Social Determinants of Health) assessments and interventions performed:    Advanced Directives Status: Not addressed in this encounter.  CCM Care Plan  Allergies  Allergen Reactions  . Contrast Media [Iodinated Diagnostic Agents] Swelling  . Iodine Swelling  . Shellfish Allergy Nausea And Vomiting and Swelling    Outpatient Encounter Medications as of 06/07/2020  Medication Sig Note  . acetaminophen (TYLENOL) 500 MG tablet Take 500 mg by mouth every 6 (six) hours as needed. 06/05/2019: Taking PRN, up to a couple times daily   . ALPRAZolam (XANAX) 1 MG tablet TAKE 0.5 TABLETS BY MOUTH 2 TIMES DAILY AS NEEDED FOR ANXIETY.   Marland Kitchen aspirin EC 81 MG tablet Take 81 mg by mouth daily.   . carvedilol (COREG) 25 MG tablet TAKE 1 TABLET BY MOUTH  TWICE A DAY   . celecoxib (CELEBREX) 100 MG capsule TAKE 1 CAPSULE (100 MG TOTAL) BY MOUTH 2 (TWO) TIMES DAILY AS NEEDED FOR MODERATE PAIN.   Marland Kitchen Continuous Blood Gluc Receiver (FREESTYLE LIBRE 14 DAY READER) DEVI 1 Device by Does not apply route daily. Use to scan to check blood sugar up to 7 times daily; E11.42,   . Continuous Blood Gluc Sensor (FREESTYLE LIBRE 14 DAY SENSOR) MISC 1 Device by Does not apply route every 14 (fourteen) days. E11.42   . desoximetasone (TOPICORT) 0.25 % cream APPLY CREAM TO AFFECTED AREA TWO TIMES DAILY, FOR UP TO 7 DAYS, DO NOT APPLY TO FACE   . EPINEPHrine (EPIPEN 2-PAK) 0.3 mg/0.3 mL IJ SOAJ injection Inject 0.3 mg into the muscle as needed for anaphylaxis.   Marland Kitchen escitalopram (LEXAPRO) 20 MG tablet TAKE 1 TABLET BY MOUTH EVERY DAY   . gabapentin (NEURONTIN) 300 MG capsule Take 1 capsule (300 mg total) by mouth at bedtime.   . hydrochlorothiazide (HYDRODIURIL) 25 MG tablet TAKE 1 TABLET BY MOUTH EVERY DAY   . insulin degludec (TRESIBA FLEXTOUCH) 100 UNIT/ML FlexTouch Pen INJECT 18 UNITS INTO THE SKIN DAILY AT 10 PM. 06/06/2020: Taking 10 units daily   . insulin lispro (HUMALOG KWIKPEN) 100 UNIT/ML KwikPen INJECT 5-7 units INTO THE SKIN three times daily with a meal (Patient not taking: Reported on 06/06/2020)   . Insulin Pen Needle (BD PEN NEEDLE NANO U/F) 32G X 4 MM MISC USE EVERY DAY   . JARDIANCE 25 MG TABS tablet TAKE 1 TABLET BY MOUTH DAILY   .  loratadine (CLARITIN) 10 MG tablet TAKE 1 TABLET BY MOUTH EVERY DAY   . lubiprostone (AMITIZA) 8 MCG capsule Take 1 capsule (8 mcg total) by mouth 2 (two) times daily with a meal. Swallow the medication whole. Do not break or chew the medication.   . metFORMIN (GLUCOPHAGE) 1000 MG tablet TAKE 1 TABLET BY MOUTH TWICE A DAY WITH MEALS   . mometasone (NASONEX) 50 MCG/ACT nasal spray INSTILL 2 SPRAYS INTO EACH NOSTRIL ONCE DAILY   . morphine (MS CONTIN) 30 MG 12 hr tablet Take 1 tablet (30 mg total) by mouth every 12 (twelve) hours.  Must last 30 days. Do not break tablet   . morphine (MS CONTIN) 30 MG 12 hr tablet Take 1 tablet (30 mg total) by mouth every 12 (twelve) hours. Must last 30 days. Do not break tablet   . [START ON 06/24/2020] morphine (MS CONTIN) 30 MG 12 hr tablet Take 1 tablet (30 mg total) by mouth every 12 (twelve) hours. Must last 30 days. Do not break tablet   . naloxone (NARCAN) 2 MG/2ML injection Inject 1 mL (1 mg total) into the muscle as needed for up to 2 doses (for emergency use only). Always have available. Inject into thigh muscle and call 911 (Patient not taking: Reported on 06/06/2020)   . olmesartan (BENICAR) 40 MG tablet TAKE 1 TABLET BY MOUTH EVERY DAY   . pantoprazole (PROTONIX) 40 MG tablet TAKE 1 TABLET BY MOUTH EVERY DAY   . pravastatin (PRAVACHOL) 40 MG tablet TAKE 1 TABLET BY MOUTH EVERY DAY   . Semaglutide, 1 MG/DOSE, (OZEMPIC, 1 MG/DOSE,) 2 MG/1.5ML SOPN Inject 1 mg into the skin once a week.   . sennosides-docusate sodium (SENOKOT-S) 8.6-50 MG tablet Take 1 tablet by mouth daily as needed.    . tamsulosin (FLOMAX) 0.4 MG CAPS capsule TAKE 1 CAPSULE BY MOUTH 2 TIMES DAILY.   Marland Kitchen Wheat Dextrin (BENEFIBER) POWD Take 6 g by mouth 3 (three) times daily before meals. (2 tsp = 6 g)    No facility-administered encounter medications on file as of 06/07/2020.    Patient Active Problem List   Diagnosis Date Noted  . Throat tightness 05/14/2020  . Left flank pain 05/14/2020  . History of kidney stones 05/14/2020  . Chronic use of opiate for therapeutic purpose 04/24/2020  . Left-sided back pain 03/27/2020  . Excessive gas 03/27/2020  . Balance problem 03/27/2020  . Uncomplicated opioid dependence ([place]) 01/24/2020  . Polypharmacy 01/24/2020  . Chronic low back pain (Bilateral) w/o sciatica 12/14/2019  . Pharmacologic therapy 05/29/2019  . Disorder of skeletal system 05/29/2019  . Problems influencing health status 05/29/2019  . Fall 05/15/2019  . Skin lesion 05/15/2019  . Colon cancer  screening 05/15/2019  . Osteoarthritis involving multiple joints 02/07/2019  . Shortened hamstring muscle 02/07/2019  . Spondylosis without myelopathy or radiculopathy, lumbosacral region 12/27/2018  . History of allergy to shellfish 12/27/2018  . History of allergy to povidone-iodine topical antiseptic 12/27/2018    Class: History of  . Right hip pain 05/03/2018  . 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 Thumb pain (Right) 03/03/2018  . Lesion of skin of foot 01/17/2018  . Chronic pain of both knees 05/18/2017  . Coccygodynia 04/19/2017  . DDD (degenerative disc disease), lumbar 04/19/2017  . Chronic musculoskeletal pain 04/19/2017  . Chronic headache 03/16/2017  . Chronic tension-type headache, intractable 02/24/2017  . Vertigo 02/24/2017  . Lumbar facet joint osteoarthritis (Bilateral) 02/16/2017  .  History of allergy to radiographic contrast media 02/16/2017  . Nevus 12/10/2016  . History of postoperative nausea and vomiting 11/24/2016  . Encounter for general adult medical examination with abnormal findings 09/25/2016  . Opioid-induced constipation ([facility]IC) 05/14/2016  . Chronic pain syndrome 02/13/2016  . Chronic shoulder radicular pain (Left) 12/18/2015  . Chronic cervical radicular pain (Left) 12/18/2015  . Disturbance of skin sensation 11/13/2015  . Anxiety and depression 07/17/2015  . Allergic rhinitis 05/22/2015  . History of Vasovagal response to spinal injections 04/30/2015    Class: History of  . BPH (benign prostatic hyperplasia) 04/17/2015  . Diabetes ([place]) 03/19/2015  . Chronic shoulder pain (Bilateral) (R>L) 03/06/2015  . Occipital neuralgia (Left) 03/06/2015  . Cervicogenic headache (Left) 03/06/2015  . Chronic low back pain (1ry area of Pain) (Bilateral) (R>L) w/ sciatica (Bilateral) 12/05/2014  . Lumbar facet syndrome (Bilateral) (R>L) 12/05/2014  . Lumbar spondylosis 12/05/2014  . Diabetic peripheral neuropathy ([place])  12/05/2014  . Long term current use of opiate analgesic 12/05/2014  . Long term prescription opiate use 12/05/2014  . Opiate use (60 MME/Day) 12/05/2014  . Opiate dependence ([place]) 12/05/2014  . Encounter for therapeutic drug level monitoring 12/05/2014  . Chronic neck pain (midline over the C7 spinous processes) (L>R) 12/05/2014  . Neurogenic pain 12/05/2014  . Neuropathic pain 12/05/2014  . Contrast dye allergy 12/05/2014  . Chronic lower extremity pain (2ry area of Pain) (Bilateral) (R>L) 12/05/2014  . 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) (Right L5 dermatome) 12/05/2014  . 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 Chronic hip pain (Right) 12/05/2014  . Class I Morbid obesity ([place]) (68% higher incidence of chronic low back pain) 12/05/2014  . Essential hypertension 12/05/2014  . GERD (gastroesophageal reflux disease) 12/05/2014  . Obstructive sleep apnea 12/05/2014  . Hyperlipidemia 12/05/2014  . Chronic kidney disease (CKD) 12/05/2014  . Abnormal nerve conduction studies (severe bilateral lower extremity polyneuropathy) 12/05/2014    Conditions to be addressed/monitored: Level of Care Concerns in Patient with Balance Problems, History of Falls/Fall Risk, Chronic Pain.  Limited Social Support, Level of Care Concerns, ADL/IADL Limitations, Social Isolation and Limited Access to Caregiver.  Care Plan : LCSW Plan of Care  Updates made by Francis Gaines, LCSW since 06/07/2020 12:00 AM    Problem: Receive Assistance with ADL's by Obtaining [place]. Resolved 06/07/2020  Priority: High    Goal: Receive Assistance with ADL's by Obtaining [place]. Completed 06/07/2020  Start Date: 04/26/2020  Expected End Date: 06/07/2020  This Visit's Progress: On track  Recent Progress: On track  Priority: High  Note:   Current Barriers:    Patient with Gait Instability, History of Falls/Fall Risk and Chronic Pain needs Support, Education, and Care  Coordination to resolve unmet personal care needs.  Patient unable to consistently perform ADL's (Activities of Daily Living) independently and needs assistance and support in order to meet this unmet need.  Currently unable to independently self manage needs related to chronic health conditions.   Limited social support, level of care concerns, social isolation, and limited ability to perform ADL's independently.  Patient requires assistance with completion and submission of application for PCS ([place]), through [employer].  Clinical Goals: . Over the next 30 days patient will work with LCSW, and [facility] to coordinate care for [facility] and select a personal care service provider. . Over the next 45 to 60 days, patient will have personal care needs met as evident  by having PCS Aide in the home assisting with needs.  Clinical Interventions: . Assessed needs, level of care concerns, basic eligibility and provided education on PCS process.  . Faxed completed PCS application to PCP for review and signature. . Provided list of PCS agencies and what to expect with PCS process. Marland Kitchen PCS referral will be faxed to [employer] at [employer] [phone], once completed and signed by PCP. Marland Kitchen LCSW will collaborate with [employer] to verify application is received and processed.  . Other interventions provided: Solution-Focused Strategies, Psychotropic Medication Adherence Assessment, Sleep Hygiene, Problem Solving, Teaching/Coaching Strategies. . Collaboration with [name], MD regarding development and update of comprehensive plan of care as evidenced by provider attestation and co-signature. Bertram Savin care team collaboration (see longitudinal plan of care). Patient Goals/Self-Care Activities:  . Continue to receive PCS ([place]) through [employer], approved for 10 hours of in-home  care services per week. . Contact LCSW directly if additional social work needs arise in the near future, contact number provided. Follow Up Plan:  No Follow-Up Required.      Follow Up Plan: No Follow-Up Required.      [name] LCSW Licensed Clinical Social Worker [facility]  [phone]

## 2020-06-11 DIAGNOSIS — M47816 Spondylosis without myelopathy or radiculopathy, lumbar region: Secondary | ICD-10-CM | POA: Diagnosis not present

## 2020-06-21 DIAGNOSIS — M47816 Spondylosis without myelopathy or radiculopathy, lumbar region: Secondary | ICD-10-CM | POA: Diagnosis not present

## 2020-06-22 ENCOUNTER — Other Ambulatory Visit: Payer: Self-pay | Admitting: Family Medicine

## 2020-06-22 DIAGNOSIS — E1142 Type 2 diabetes mellitus with diabetic polyneuropathy: Secondary | ICD-10-CM

## 2020-06-23 ENCOUNTER — Other Ambulatory Visit: Payer: Self-pay | Admitting: Family Medicine

## 2020-06-26 DIAGNOSIS — M47816 Spondylosis without myelopathy or radiculopathy, lumbar region: Secondary | ICD-10-CM | POA: Diagnosis not present

## 2020-06-27 DIAGNOSIS — M47816 Spondylosis without myelopathy or radiculopathy, lumbar region: Secondary | ICD-10-CM | POA: Diagnosis not present

## 2020-06-28 ENCOUNTER — Encounter: Payer: Self-pay | Admitting: Family Medicine

## 2020-06-28 ENCOUNTER — Ambulatory Visit (INDEPENDENT_AMBULATORY_CARE_PROVIDER_SITE_OTHER): Payer: Medicare Other | Admitting: Family Medicine

## 2020-06-28 ENCOUNTER — Other Ambulatory Visit: Payer: Self-pay

## 2020-06-28 VITALS — BP 116/64 | HR 90 | Temp 98.0°F | Ht 73.0 in | Wt 236.2 lb

## 2020-06-28 DIAGNOSIS — E118 Type 2 diabetes mellitus with unspecified complications: Secondary | ICD-10-CM | POA: Diagnosis not present

## 2020-06-28 DIAGNOSIS — Z1211 Encounter for screening for malignant neoplasm of colon: Secondary | ICD-10-CM

## 2020-06-28 DIAGNOSIS — G894 Chronic pain syndrome: Secondary | ICD-10-CM | POA: Diagnosis not present

## 2020-06-28 DIAGNOSIS — Z794 Long term (current) use of insulin: Secondary | ICD-10-CM | POA: Diagnosis not present

## 2020-06-28 DIAGNOSIS — R109 Unspecified abdominal pain: Secondary | ICD-10-CM

## 2020-06-28 DIAGNOSIS — E1142 Type 2 diabetes mellitus with diabetic polyneuropathy: Secondary | ICD-10-CM | POA: Diagnosis not present

## 2020-06-28 LAB — POCT GLYCOSYLATED HEMOGLOBIN (HGB A1C): Hemoglobin A1C: 7.9 % — AB (ref 4.0–5.6)

## 2020-06-28 MED ORDER — GABAPENTIN 300 MG/6ML PO SOLN: 300.0000 mg | Freq: Every day | ORAL | 2 refills | Status: DC

## 2020-06-28 MED ORDER — OZEMPIC (2 MG/DOSE) 8 MG/3ML ~~LOC~~ SOPN: 2.0000 mg | PEN_INJECTOR | SUBCUTANEOUS | 1 refills | Status: DC

## 2020-06-28 NOTE — Progress Notes (Signed)
Alan Rumps, MD Phone: [phone]  FAHIM KATS is a 75 y.o. male who presents today for f/u.  DIABETES Disease Monitoring: Blood Sugar ranges-76% in range with freestyle libre Polyuria/phagia/dipsia- no      Optho- due Medications: Compliance- taking jardiance, metformin, ozempic, tresiba 12 u daily, Humalog 7 u with low carb meals, 10-12 u with higher carb meals Hypoglycemic symptoms- no  Chronic pain: Patient follows with pain management for his narcotics though they have transitioned his gabapentin dose and [place].  The patient wonders about switching to liquid gabapentin as he has heard that may be more beneficial for people.  He also continues with physical therapy which has been quite beneficial and he has noticed significant improvement in his legs.  Left flank pain: This has improved significantly.   Social History   Tobacco Use  Smoking Status Former Smoker  . Types: Cigarettes  Smokeless Tobacco Never Used    Current Outpatient Medications on File Prior to Visit  Medication Sig Dispense Refill  . acetaminophen (TYLENOL) 500 MG tablet Take 500 mg by mouth every 6 (six) hours as needed.    . ALPRAZolam (XANAX) 1 MG tablet TAKE 0.5 TABLETS BY MOUTH 2 TIMES DAILY AS NEEDED FOR ANXIETY. 30 tablet 0  . aspirin EC 81 MG tablet Take 81 mg by mouth daily.    . carvedilol (COREG) 25 MG tablet TAKE 1 TABLET BY MOUTH TWICE A DAY 60 tablet 5  . celecoxib (CELEBREX) 100 MG capsule TAKE 1 CAPSULE (100 MG TOTAL) BY MOUTH 2 (TWO) TIMES DAILY AS NEEDED FOR MODERATE PAIN. 60 capsule 0  . Continuous Blood Gluc Receiver (FREESTYLE LIBRE 14 DAY READER) DEVI 1 Device by Does not apply route daily. Use to scan to check blood sugar up to 7 times daily; E11.42, 1 Device 1  . Continuous Blood Gluc Sensor (FREESTYLE LIBRE 14 DAY SENSOR) MISC 1 Device by Does not apply route every 14 (fourteen) days. E11.42 6 each 3  . desoximetasone (TOPICORT) 0.25 % cream APPLY CREAM TO AFFECTED AREA TWO  TIMES DAILY, FOR UP TO 7 DAYS, DO NOT APPLY TO FACE 30 g 0  . EPINEPHrine (EPIPEN 2-PAK) 0.3 mg/0.3 mL IJ SOAJ injection Inject 0.3 mg into the muscle as needed for anaphylaxis. 2 each 0  . escitalopram (LEXAPRO) 20 MG tablet TAKE 1 TABLET BY MOUTH EVERY DAY 90 tablet 1  . hydrochlorothiazide (HYDRODIURIL) 25 MG tablet TAKE 1 TABLET BY MOUTH EVERY DAY 30 tablet 5  . insulin degludec (TRESIBA FLEXTOUCH) 100 UNIT/ML FlexTouch Pen INJECT 18 UNITS INTO THE SKIN DAILY AT 10 PM. 15 mL 3  . insulin lispro (HUMALOG KWIKPEN) 100 UNIT/ML KwikPen INJECT 5-7 units INTO THE SKIN three times daily with a meal 15 mL 2  . Insulin Pen Needle (BD PEN NEEDLE NANO U/F) 32G X 4 MM MISC USE EVERY DAY 100 each 4  . JARDIANCE 25 MG TABS tablet TAKE 1 TABLET BY MOUTH DAILY 30 tablet 3  . loratadine (CLARITIN) 10 MG tablet TAKE 1 TABLET BY MOUTH EVERY DAY 30 tablet 11  . metFORMIN (GLUCOPHAGE) 1000 MG tablet TAKE 1 TABLET BY MOUTH TWICE A DAY WITH MEALS 60 tablet 5  . mometasone (NASONEX) 50 MCG/ACT nasal spray INSTILL 2 SPRAYS INTO EACH NOSTRIL ONCE DAILY 17 each 3  . morphine (MS CONTIN) 30 MG 12 hr tablet Take 1 tablet (30 mg total) by mouth every 12 (twelve) hours. Must last 30 days. Do not break tablet 60 tablet 0  .  naloxone (NARCAN) 2 MG/2ML injection Inject 1 mL (1 mg total) into the muscle as needed for up to 2 doses (for emergency use only). Always have available. Inject into thigh muscle and call 911 2 mL 1  . olmesartan (BENICAR) 40 MG tablet TAKE 1 TABLET BY MOUTH EVERY DAY 30 tablet 5  . pantoprazole (PROTONIX) 40 MG tablet TAKE 1 TABLET BY MOUTH EVERY DAY 30 tablet 5  . pravastatin (PRAVACHOL) 40 MG tablet TAKE 1 TABLET BY MOUTH EVERY DAY 30 tablet 5  . sennosides-docusate sodium (SENOKOT-S) 8.6-50 MG tablet Take 1 tablet by mouth daily as needed.     . tamsulosin (FLOMAX) 0.4 MG CAPS capsule TAKE 1 CAPSULE BY MOUTH 2 TIMES DAILY. 60 capsule 8  . lubiprostone (AMITIZA) 8 MCG capsule Take 1 capsule (8 mcg  total) by mouth 2 (two) times daily with a meal. Swallow the medication whole. Do not break or chew the medication. 60 capsule 2  . morphine (MS CONTIN) 30 MG 12 hr tablet Take 1 tablet (30 mg total) by mouth every 12 (twelve) hours. Must last 30 days. Do not break tablet 60 tablet 0  . morphine (MS CONTIN) 30 MG 12 hr tablet Take 1 tablet (30 mg total) by mouth every 12 (twelve) hours. Must last 30 days. Do not break tablet 60 tablet 0  . Wheat Dextrin (BENEFIBER) POWD Take 6 g by mouth 3 (three) times daily before meals. (2 tsp = 6 g) 730 g 2   No current facility-administered medications on file prior to visit.     ROS see history of present illness  Objective  Physical Exam Vitals:   06/28/20 1009  BP: 116/64  Pulse: 90  Temp: 98 F (36.7 C)  SpO2: 98%    BP Readings from Last 3 Encounters:  06/28/20 116/64  06/04/20 130/70  05/30/20 100/66   Wt Readings from Last 3 Encounters:  06/28/20 236 lb 3.2 oz (107.1 kg)  06/04/20 239 lb (108.4 kg)  05/30/20 232 lb (105.2 kg)    Physical Exam Constitutional:      General: He is not in acute distress.    Appearance: He is not diaphoretic.  Cardiovascular:     Rate and Rhythm: Normal rate and regular rhythm.     Heart sounds: Normal heart sounds.  Pulmonary:     Effort: Pulmonary effort is normal.     Breath sounds: Normal breath sounds.  Musculoskeletal:     Right lower leg: No edema.     Left lower leg: No edema.  Skin:    General: Skin is warm and dry.  Neurological:     Mental Status: He is alert.      Assessment/Plan: Please see individual problem list.  Problem List Items Addressed This Visit    Chronic pain syndrome (Chronic)    He will continue to see his pain specialist.  We will transition his gabapentin to liquid dosing.      Relevant Medications   gabapentin (NEURONTIN) 300 MG/6ML solution   Diabetes ([place])    Uncontrolled.  Check A1c.  We will increase his Ozempic to 2 mg once weekly.  He will  continue [place] 12 units once daily, Humalog as outlined, Jardiance 25 mg once daily, metformin 1000 mg twice daily.  I encouraged him to see his eye doctor.      Relevant Medications   Semaglutide, 2 MG/DOSE, (OZEMPIC, 2 MG/DOSE,) 8 MG/3ML SOPN   Other Relevant Orders   POCT HgB A1C  Colon cancer screening - Primary   Relevant Orders   Fecal occult blood, imunochemical   Left flank pain    Much improved.  He will monitor.  Suspect muscular strain.         Return in about 3 months (around 09/28/2020) for dm.  This visit occurred during the SARS-CoV-2 public health emergency.  Safety protocols were in place, including screening questions prior to the visit, additional usage of staff PPE, and extensive cleaning of exam room while observing appropriate contact time as indicated for disinfecting solutions.    Alan Rumps, MD [place]

## 2020-06-28 NOTE — Addendum Note (Signed)
Addended by: Leeanne Rio on: 06/28/2020 11:10 AM   Modules accepted: Orders

## 2020-06-28 NOTE — Assessment & Plan Note (Signed)
Much improved.  He will monitor.  Suspect muscular strain.

## 2020-06-28 NOTE — Assessment & Plan Note (Signed)
He will continue to see his pain specialist.  We will transition his gabapentin to liquid dosing.

## 2020-06-28 NOTE — Assessment & Plan Note (Addendum)
Uncontrolled.  Check A1c.  We will increase his Ozempic to 2 mg once weekly.  He will continue [place] 12 units once daily, Humalog as outlined, Jardiance 25 mg once daily, metformin 1000 mg twice daily.  I encouraged him to see his eye doctor.

## 2020-06-28 NOTE — Patient Instructions (Signed)
Nice to see you. We will try transitioning you to liquid gabapentin Please complete the stool cards for your yearly colon cancer screening.

## 2020-07-04 DIAGNOSIS — M47816 Spondylosis without myelopathy or radiculopathy, lumbar region: Secondary | ICD-10-CM | POA: Diagnosis not present

## 2020-07-08 ENCOUNTER — Other Ambulatory Visit: Payer: Self-pay | Admitting: Family Medicine

## 2020-07-08 DIAGNOSIS — Z794 Long term (current) use of insulin: Secondary | ICD-10-CM

## 2020-07-08 DIAGNOSIS — E1142 Type 2 diabetes mellitus with diabetic polyneuropathy: Secondary | ICD-10-CM

## 2020-07-09 ENCOUNTER — Other Ambulatory Visit: Payer: Self-pay | Admitting: Family Medicine

## 2020-07-09 DIAGNOSIS — M47816 Spondylosis without myelopathy or radiculopathy, lumbar region: Secondary | ICD-10-CM | POA: Diagnosis not present

## 2020-07-09 NOTE — Telephone Encounter (Signed)
RX Refill:xanax Last Seen:06-28-20 Last ordered:06-07-20

## 2020-07-10 NOTE — Telephone Encounter (Signed)
This was just refilled by the patient on 06/22/20. Needs to request a refill closer to the time it is due.

## 2020-07-15 ENCOUNTER — Other Ambulatory Visit: Payer: Self-pay | Admitting: Family Medicine

## 2020-07-16 DIAGNOSIS — M47816 Spondylosis without myelopathy or radiculopathy, lumbar region: Secondary | ICD-10-CM | POA: Diagnosis not present

## 2020-07-18 DIAGNOSIS — M47816 Spondylosis without myelopathy or radiculopathy, lumbar region: Secondary | ICD-10-CM | POA: Diagnosis not present

## 2020-07-22 ENCOUNTER — Other Ambulatory Visit: Payer: Self-pay | Admitting: *Deleted

## 2020-07-22 NOTE — Patient Outreach (Signed)
[facility]) Care Management  07/22/2020  ALFIE RIDEAUX [birth year] [phone]  Unsuccessful outreach attempt made to patient. RN Health Coach left HIPAA compliant voicemail message along with her contact information.  Plan: RN Health Coach will call patient within the month of July.  [name] RN, BSN [place] [phone] [email or web address]

## 2020-07-23 DIAGNOSIS — M47816 Spondylosis without myelopathy or radiculopathy, lumbar region: Secondary | ICD-10-CM | POA: Diagnosis not present

## 2020-07-23 NOTE — Progress Notes (Signed)
PROVIDER NOTE: Information contained herein reflects review and annotations entered in association with encounter. Interpretation of such information and data should be left to medically-trained personnel. Information provided to patient can be located elsewhere in the medical record under "Patient Instructions". Document created using STT-dictation technology, any transcriptional errors that may result from process are unintentional.    Patient: Alan Schmidt  Service Category: E/M  Provider: Gaspar Cola, MD  DOB: Oct 25, 1945  DOS: 07/24/2020  Specialty: Interventional Pain Management  MRN: [id number]  Setting: Ambulatory outpatient  PCP: [name], MD  Type: Established Patient    Referring Provider: [name], MD  Location: Office  Delivery: Face-to-face     HPI  [name], a 75 y.o. year old male, is here today because of his Chronic pain syndrome [G89.4]. [name] primary complain today is Back Pain (Lumbar more on the right ) Last encounter: My last encounter with him was on 04/24/2020. Pertinent problems: [name] has Chronic low back pain (Bilateral) (R>L) w/ sciatica (Bilateral); Lumbar facet syndrome (Bilateral) (R>L); Lumbar spondylosis; Diabetic peripheral neuropathy ([name]); Chronic neck pain (midline over the C7 spinous processes) (L>R); Neurogenic pain; Neuropathic pain; Chronic lower extremity pain (2ry area of Pain) (Bilateral) (R>L); 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) (Right L5 dermatome); History of total hip replacement (Right); Chronic hip pain (Right); Abnormal nerve conduction studies (severe bilateral lower extremity polyneuropathy); Chronic shoulder pain (Bilateral) (R>L); Occipital neuralgia (Left); Cervicogenic headache (Left); Chronic shoulder radicular pain (Left); Chronic cervical radicular pain (Left); Chronic pain syndrome; Lumbar facet joint osteoarthritis (Bilateral); Chronic headache; Chronic tension-type headache,  intractable; Coccygodynia; DDD (degenerative disc disease), lumbar; Chronic musculoskeletal pain; Chronic knee pain (Bilateral); Thumb pain (Right); 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 Pain Assessment: Severity of Chronic pain is reported as a 2 /10. Location: Back Lower, Right/into the back of the right leg. Onset: More than a month ago. Quality: Discomfort, Constant, Aching. Timing: Constant. Modifying factor(s): rest, medications. Vitals:  height is 6' 1" (1.854 m) and weight is 239 lb (108.4 kg). His temporal temperature is 97.3 F (36.3 C) (abnormal). His blood pressure is 139/90 and his pulse is 86. His respiration is 16 and oxygen saturation is 97%.   Reason for encounter: medication management.   The patient indicates doing well with the current medication regimen. No adverse reactions or side effects reported to the medications.  Today I had a very long conversation with this patient where I gave him a final warning relief regards to his lack of compliance with his medication agreement.  Today I confronted him with the results of the 04/24/2020 UDS which came back positive for unreported oxycodone.  Today he claims that he was having more pain than usual and he took it upon himself to take some old pills that he had at home.  Today I spent quite a bit of time reminding the patient on how dangerous it is to add medications to his regimen without consulting [place] first.  I reviewed his PMP and the last time that he had obtained a prescription for oxycodone had been on 07/20/2019.  Today I have informed the patient that he needs to go home and collect all of the pain medications that he may have there  so  that he can bring him over here to be counted and destroyed in front of witnesses.  He is not to take any more opioid analgesics without my explicit consent.  Today we will be repeating his UDS.  He has already mentioned to [place] that it may come back again with oxycodone.  I have again repeated to him that this represents noncompliance with her medication therapy and this type of violation may result in permanent discontinuation of his opioid analgesics.  I have already mentioned to him how uncomfortable it makes me to prescribe opioid analgesics when he is known to be taking a benzodiazepine.  I have reminded him that taking benzodiazepines within 8 hours of an opioid may result in a drug to drug interaction that may cause respiratory depression and death.  He indicated that he understood that and that he "would never do that".  However I am concerned about this behavior and therefore I will be keeping a very close eye on him for further transgressions.  RTCB: 10/22/2020 Nonopioids transferred 12/14/2019: Gabapentin, Amitiza, Benefiber, Mobic  Pharmacotherapy Assessment  Analgesic: Morphine ER (MS Contin) 30 mg, 1 tab p.o. twice daily (60 mg/day).  (Previously on oxycodone IR 10 mg every 6 hours (40 mg/day)) MME/day: 60 mg/day.   Monitoring: [place] PMP: PDMP reviewed during this encounter.       Pharmacotherapy: No side-effects or adverse reactions reported. Compliance: No problems identified. Effectiveness: Clinically acceptable.  Janett Billow, RN  07/24/2020 11:26 AM  Sign when Signing Visit Nursing Pain Medication Assessment:  Safety precautions to be maintained throughout the outpatient stay will include: orient to surroundings, keep bed in low position, maintain call bell within reach at all times, provide assistance with transfer out of bed and ambulation.  Medication Inspection Compliance: Pill count conducted under aseptic conditions, in front of the patient. Neither the pills nor the  bottle was removed from the patient's sight at any time. Once count was completed pills were immediately returned to the patient in their original bottle.  Medication: Morphine ER (MSContin) Pill/Patch Count:  21 of 60 pills remain Pill/Patch Appearance: Markings consistent with prescribed medication Bottle Appearance: Standard pharmacy container. Clearly labeled. Filled Date: 06 / 08 / 2022 Last Medication intake:  Today    UDS:  Summary  Date Value Ref Range Status  04/24/2020 Note  Final    Comment:    ==================================================================== ToxASSURE Select 13 (MW) ==================================================================== Test                             Result       Flag       Units  Drug Present and Declared for Prescription Verification   Alprazolam                     36           EXPECTED   ng/mg creat   Alpha-hydroxyalprazolam        160          EXPECTED   ng/mg creat    Source of alprazolam is a scheduled prescription medication. Alpha-    hydroxyalprazolam is an expected metabolite of alprazolam.    Morphine                       >10870       EXPECTED   ng/mg creat   Normorphine  736          EXPECTED   ng/mg creat    Potential sources of large amounts of morphine in the absence of    codeine include administration of morphine or use of heroin.     Normorphine is an expected metabolite of morphine.    Hydromorphone                  136          EXPECTED   ng/mg creat    Hydromorphone may be present as a metabolite of morphine;    concentrations of hydromorphone rarely exceed 5% of the morphine    concentration when this is the source of hydromorphone.  Drug Present not Declared for Prescription Verification   Oxycodone                      112          UNEXPECTED ng/mg creat   Noroxycodone                   774          UNEXPECTED ng/mg creat    Sources of oxycodone include scheduled prescription  medications.    Noroxycodone is an expected metabolite of oxycodone.  ==================================================================== Test                      Result    Flag   Units      Ref Range   Creatinine              92               mg/dL      >=20 ==================================================================== Declared Medications:  The flagging and interpretation on this report are based on the  following declared medications.  Unexpected results may arise from  inaccuracies in the declared medications.   **Note: The testing scope of this panel includes these medications:   Alprazolam  Morphine (MS Contin)   **Note: The testing scope of this panel does not include the  following reported medications:   Acetaminophen (Tylenol)  Aspirin  Bupropion (Wellbutrin XL)  Carvedilol (Coreg)  Celecoxib (Celebrex)  Docusate  Empagliflozin (Jardiance)  Escitalopram (Lexapro)  Gabapentin  Hydrochlorothiazide  Insulin Tyler Aas)  Liraglutide (Victoza)  Loratadine (Claritin)  Lubiprostone (Amitiza)  Meloxicam  Metformin  Mometasone (Nasonex)  Naloxone (Narcan)  Olmesartan (Benicar)  Pantoprazole (Protonix)  Pravastatin  Sennosides  Supplement  Tamsulosin  Topical ==================================================================== For clinical consultation, please call [phone]. ====================================================================      ROS  Constitutional: Denies any fever or chills Gastrointestinal: No reported hemesis, hematochezia, vomiting, or acute GI distress Musculoskeletal: Denies any acute onset joint swelling, redness, loss of ROM, or weakness Neurological: No reported episodes of acute onset apraxia, aphasia, dysarthria, agnosia, amnesia, paralysis, loss of coordination, or loss of consciousness  Medication Review  ALPRAZolam, Benefiber, EPINEPHrine, FreeStyle Libre 14 Day Reader, FreeStyle Libre 14 Day Sensor, Insulin Pen  Needle, Semaglutide (2 MG/DOSE), acetaminophen, aspirin EC, carvedilol, celecoxib, desoximetasone, empagliflozin, escitalopram, gabapentin, hydrochlorothiazide, insulin degludec, insulin lispro, loratadine, lubiprostone, metFORMIN, mometasone, morphine, naloxone, olmesartan, pantoprazole, pravastatin, sennosides-docusate sodium, and tamsulosin  History Review  Allergy: Mr. Broadhead is allergic to contrast media [iodinated diagnostic agents], iodine, and shellfish allergy. Drug: Mr. Covalt  reports no history of drug use. Alcohol:  reports no history of alcohol use. Tobacco:  reports that he has quit smoking. His smoking use included cigarettes. He  has never used smokeless tobacco. Social: Mr. Sortor  reports that he has quit smoking. His smoking use included cigarettes. He has never used smokeless tobacco. He reports that he does not drink alcohol and does not use drugs. Medical:  has a past medical history of Acute postoperative pain (11/24/2016), Anxiety, Chronic hip pain (Right) (12/05/2014), Chronic lumbar pain, Depression, Diabetes mellitus without complication ([place]), Hyperlipidemia, Hypertension, Kidney stones, and Migraines. Surgical: Mr. Hudlow  has a past surgical history that includes Total hip arthroplasty; Tonsillectomy; and right hip surgery. Family: family history includes Cancer in his mother; Diabetes in his father, sister, and sister; Heart disease in his father; Stroke in his father.  Laboratory Chemistry Profile   Renal Lab Results  Component Value Date   BUN 17 03/22/2020   CREATININE 1.09 03/22/2020   GFR 67.05 03/22/2020   GFRAA >60 03/26/2018   GFRNONAA >60 03/26/2018     Hepatic Lab Results  Component Value Date   AST 19 03/22/2020   ALT 26 03/22/2020   ALBUMIN 4.1 03/22/2020   ALKPHOS 99 03/22/2020     Electrolytes Lab Results  Component Value Date   NA 137 03/22/2020   K 4.4 03/22/2020   CL 99 03/22/2020   CALCIUM 9.5 03/22/2020   MG 1.8  03/06/2015     Bone No results found for: VD25OH, VD125OH2TOT, PJ0932IZ1, IW5809XI3, 25OHVITD1, 25OHVITD2, 25OHVITD3, TESTOFREE, TESTOSTERONE   Inflammation (CRP: Acute Phase) (ESR: Chronic Phase) Lab Results  Component Value Date   CRP 0.5 03/06/2015   ESRSEDRATE 45 (H) 12/14/2016       Note: Above Lab results reviewed.  Recent Imaging Review  CT RENAL STONE STUDY CLINICAL DATA:  Left lower quadrant abdominal pain. History of nephrolithiasis.  EXAM: CT ABDOMEN AND PELVIS WITHOUT CONTRAST  TECHNIQUE: Multidetector CT imaging of the abdomen and pelvis was performed following the standard protocol without IV contrast.  COMPARISON:  10/25/2016 CT abdomen/pelvis.  FINDINGS: Lower chest: No significant pulmonary nodules or acute consolidative airspace disease. Coronary atherosclerosis.  Hepatobiliary: Normal liver size. Scattered punctate granulomatous calcifications in the liver are unchanged. No liver masses. Normal gallbladder with no radiopaque cholelithiasis. No biliary ductal dilatation.  Pancreas: Normal, with no mass or duct dilation.  Spleen: Normal size spleen. Granulomatous splenic calcifications. No splenic masses.  Adrenals/Urinary Tract: Normal adrenals. No renal stones. No hydronephrosis. Simple bilateral renal cysts, largest 3.6 cm in the upper left kidney and 2.9 cm in the posterior interpolar right kidney. Normal caliber ureters. No ureteral stones, noting poor visualization of the pelvic ureters due to streak artifact from right hip hardware. Bladder obscured by streak artifact. A 3 mm calcification projecting over the posterior bladder just to the right of midline (series 2/image 84), not previously visualized on 10/25/2016 CT, cannot exclude a layering bladder stone.  Stomach/Bowel: Normal non-distended stomach. Normal caliber small bowel with no small bowel wall thickening. Normal appendix. Minimal scattered left colonic diverticulosis with no  large bowel wall thickening or significant pericolonic fat stranding.  Vascular/Lymphatic: Atherosclerotic nonaneurysmal abdominal aorta. No pathologically enlarged lymph nodes in the abdomen or pelvis.  Reproductive: Mildly enlarged prostate.  Other: No pneumoperitoneum, ascites or focal fluid collection.  Musculoskeletal: No aggressive appearing focal osseous lesions. Right total hip arthroplasty. Marked lumbar spondylosis.  IMPRESSION: 1. Limited pelvic visualization due to streak artifact from right hip hardware. A 3 mm calcification projecting over the posterior bladder just to the right of midline, not previously visualized on 10/25/2016 CT, cannot exclude a layering bladder stone. No hydronephrosis.  No renal collecting system or ureteral stones. 2. Minimal left colonic diverticulosis. 3. Mildly enlarged prostate. 4. Coronary atherosclerosis. 5. Aortic Atherosclerosis (ICD10-I70.0).  Electronically Signed   By: Ilona Sorrel M.D.   On: 05/30/2020 16:17 Note: Reviewed        Physical Exam  General appearance: Well nourished, well developed, and well hydrated. In no apparent acute distress Mental status: Alert, oriented x 3 (person, place, & time)       Respiratory: No evidence of acute respiratory distress Eyes: PERLA Vitals: BP 139/90 (BP Location: Left Arm, Patient Position: Sitting, Cuff Size: Normal)   Pulse 86   Temp (!) 97.3 F (36.3 C) (Temporal)   Resp 16   Ht 6' 1" (1.854 m)   Wt 239 lb (108.4 kg)   SpO2 97%   BMI 31.53 kg/m  BMI: Estimated body mass index is 31.53 kg/m as calculated from the following:   Height as of this encounter: 6' 1" (1.854 m).   Weight as of this encounter: 239 lb (108.4 kg). Ideal: Ideal body weight: 79.9 kg (176 lb 2.4 oz) Adjusted ideal body weight: 91.3 kg (201 lb 4.6 oz)  Assessment   Status Diagnosis  Controlled Controlled Controlled 1. Chronic pain syndrome   2. Chronic low back pain (1ry area of Pain) (Bilateral)  w/o sciatica   3. DDD (degenerative disc disease), lumbar   4. Lumbar facet syndrome (Bilateral) (R>L)   5. Chronic lower extremity pain (2ry area of Pain) (Bilateral) (R>L)   6. 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 Chronic knee pain (Bilateral)   8. Cervicogenic headache (Left)   9. Chronic neck pain (midline over the C7 spinous processes) (L>R)   10. DDD (degenerative disc disease), cervical   11. Diabetic peripheral neuropathy ([place])   12. Pharmacologic therapy   13. Chronic use of opiate for therapeutic purpose   14. Encounter for chronic pain management   15. Non compliance w medication regimen      Updated Problems: Problem  Ddd (Degenerative Disc Disease), Cervical  Chronic low back pain (1ry area of Pain) (Bilateral) w/o sciatica  Chronic knee pain (Bilateral)  Chronic low back pain (Bilateral) (R>L) w/ sciatica (Bilateral)  Non Compliance W Medication Regimen   04/24/2020 UDS positive for one reported oxycodone.  Apparently the patient had been hoarding some medication, which he should have brought back to the office to count and dispose of.  He took it upon himself to add oxycodone to his morphine regimen, which makes me very concerned about his decision making.  Furthermore, he did this without reporting it to [place].      Plan of Care  Problem-specific:  No problem-specific Assessment & Plan notes found for this encounter.  Mr. CROSS JORGE has a current medication list which includes the following long-term medication(s): carvedilol, escitalopram, gabapentin, hydrochlorothiazide, insulin lispro, loratadine, lubiprostone, metformin, mometasone, morphine, [START ON 08/23/2020] morphine, [START ON 09/22/2020] morphine, olmesartan, pantoprazole, pravastatin, and benefiber.  Pharmacotherapy (Medications Ordered): Meds ordered this encounter  Medications   morphine (MS CONTIN) 30 MG 12 hr tablet    Sig: Take 1 tablet (30 mg total) by mouth every  12 (twelve) hours. Must last 30 days. Do not break tablet    Dispense:  60 tablet    Refill:  0    Not a duplicate. Do NOT delete! Dispense 1 day early if closed on refill date. Avoid benzodiazepines within 8 hours of opioids. Do not send refill  requests.   morphine (MS CONTIN) 30 MG 12 hr tablet    Sig: Take 1 tablet (30 mg total) by mouth every 12 (twelve) hours. Must last 30 days. Do not break tablet    Dispense:  60 tablet    Refill:  0    Not a duplicate. Do NOT delete! Dispense 1 day early if closed on refill date. Avoid benzodiazepines within 8 hours of opioids. Do not send refill requests.   morphine (MS CONTIN) 30 MG 12 hr tablet    Sig: Take 1 tablet (30 mg total) by mouth every 12 (twelve) hours. Must last 30 days. Do not break tablet    Dispense:  60 tablet    Refill:  0    Not a duplicate. Do NOT delete! Dispense 1 day early if closed on refill date. Avoid benzodiazepines within 8 hours of opioids. Do not send refill requests.    Orders:  Orders Placed This Encounter  Procedures   ToxASSURE Select 13 (MW), Urine    Volume: 30 ml(s). Minimum 3 ml of urine is needed. Document temperature of fresh sample. Indications: Long term (current) use of opiate analgesic (H47.654)    Order Specific Question:   Release to patient    Answer:   Immediate    Follow-up plan:   Return in about 3 months (around 10/22/2020) for evaluation day (F2F) (MM).     Interventional Therapies  Risk  Complexity Considerations:   Estimated body mass index is 31.53 kg/m as calculated from the following:   Height as of this encounter: 6' 1" (1.854 m).   Weight as of this encounter: 239 lb (108.4 kg). NO MORE RFA - intraoperative noncompliance, unnecessarily increasing radiation exposure NOTE: NO MORE RFA procedures. Difficulty following intra-procedural instructions and commands, resulting in unnecessarily long exposure of staff to radiation.   Planned  Pending:   Pending further evaluation    Under consideration:   Diagnostic Left GONB    Completed:   Therapeutic left lumbar facet RFA x2 (05/09/2019)  Therapeutic right lumbar facet RFA x6 (12/14/2019)    Therapeutic  Palliative (PRN) options:   Palliative right lumbar facet block #3  Palliative left lumbar facet block #3  Therapeutic left cervical ESI #2     Recent Visits No visits were found meeting these conditions. Showing recent visits within past 90 days and meeting all other requirements Today's Visits Date Type Provider Dept  07/24/20 Office Visit Milinda Pointer, MD [facility]  Showing today's visits and meeting all other requirements Future Appointments Date Type Provider Dept  10/16/20 Appointment Milinda Pointer, MD [facility]  Showing future appointments within next 90 days and meeting all other requirements I discussed the assessment and treatment plan with the patient. The patient was provided an opportunity to ask questions and all were answered. The patient agreed with the plan and demonstrated an understanding of the instructions.  Patient advised to call back or seek an in-person evaluation if the symptoms or condition worsens.  Duration of encounter: 30 minutes.  Note by: Gaspar Cola, MD Date: 07/24/2020; Time: 12:47 PM

## 2020-07-24 ENCOUNTER — Other Ambulatory Visit: Payer: Self-pay

## 2020-07-24 ENCOUNTER — Encounter: Payer: Self-pay | Admitting: Pain Medicine

## 2020-07-24 ENCOUNTER — Ambulatory Visit: Payer: Medicare Other | Attending: Pain Medicine | Admitting: Pain Medicine

## 2020-07-24 VITALS — BP 139/90 | HR 86 | Temp 97.3°F | Resp 16 | Ht 73.0 in | Wt 239.0 lb

## 2020-07-24 DIAGNOSIS — G894 Chronic pain syndrome: Secondary | ICD-10-CM

## 2020-07-24 DIAGNOSIS — M51369 Other intervertebral disc degeneration, lumbar region without mention of lumbar back pain or lower extremity pain: Secondary | ICD-10-CM

## 2020-07-24 DIAGNOSIS — M25562 Pain in left knee: Secondary | ICD-10-CM | POA: Diagnosis not present

## 2020-07-24 DIAGNOSIS — M545 Low back pain, unspecified: Secondary | ICD-10-CM | POA: Insufficient documentation

## 2020-07-24 DIAGNOSIS — G8929 Other chronic pain: Secondary | ICD-10-CM

## 2020-07-24 DIAGNOSIS — M5030 Other cervical disc degeneration, unspecified cervical region: Secondary | ICD-10-CM

## 2020-07-24 DIAGNOSIS — M25551 Pain in right hip: Secondary | ICD-10-CM | POA: Insufficient documentation

## 2020-07-24 DIAGNOSIS — M47816 Spondylosis without myelopathy or radiculopathy, lumbar region: Secondary | ICD-10-CM | POA: Diagnosis not present

## 2020-07-24 DIAGNOSIS — M79604 Pain in right leg: Secondary | ICD-10-CM | POA: Diagnosis not present

## 2020-07-24 DIAGNOSIS — M25561 Pain in right knee: Secondary | ICD-10-CM | POA: Insufficient documentation

## 2020-07-24 DIAGNOSIS — M79605 Pain in left leg: Secondary | ICD-10-CM | POA: Diagnosis not present

## 2020-07-24 DIAGNOSIS — Z9114 Patient's other noncompliance with medication regimen: Secondary | ICD-10-CM | POA: Diagnosis not present

## 2020-07-24 DIAGNOSIS — G4486 Cervicogenic headache: Secondary | ICD-10-CM

## 2020-07-24 DIAGNOSIS — M5136 Other intervertebral disc degeneration, lumbar region: Secondary | ICD-10-CM | POA: Insufficient documentation

## 2020-07-24 DIAGNOSIS — Z96641 Presence of right artificial hip joint: Secondary | ICD-10-CM | POA: Insufficient documentation

## 2020-07-24 DIAGNOSIS — Z91148 Patient's other noncompliance with medication regimen for other reason: Secondary | ICD-10-CM

## 2020-07-24 DIAGNOSIS — M542 Cervicalgia: Secondary | ICD-10-CM

## 2020-07-24 DIAGNOSIS — Z79899 Other long term (current) drug therapy: Secondary | ICD-10-CM | POA: Diagnosis not present

## 2020-07-24 DIAGNOSIS — Z79891 Long term (current) use of opiate analgesic: Secondary | ICD-10-CM | POA: Diagnosis not present

## 2020-07-24 DIAGNOSIS — E1142 Type 2 diabetes mellitus with diabetic polyneuropathy: Secondary | ICD-10-CM | POA: Diagnosis not present

## 2020-07-24 MED ORDER — MORPHINE SULFATE ER 30 MG PO TBCR: 30.0000 mg | EXTENDED_RELEASE_TABLET | Freq: Two times a day (BID) | ORAL | 0 refills | Status: DC

## 2020-07-24 NOTE — Patient Instructions (Signed)
____________________________________________________________________________________________  Medication Rules  Purpose: To inform patients, and their family members, of our rules and regulations.  Applies to: All patients receiving prescriptions (written or electronic).  Pharmacy of record: Pharmacy where electronic prescriptions will be sent. If written prescriptions are taken to a different pharmacy, please inform the nursing staff. The pharmacy listed in the electronic medical record should be the one where you would like electronic prescriptions to be sent.  Electronic prescriptions: In compliance with the [place] Strengthen Opioid Misuse Prevention (STOP) Act of 2017 (Session Law 2017-74/H243), effective February 02, 2018, all controlled substances must be electronically prescribed. Calling prescriptions to the pharmacy will cease to exist.  Prescription refills: Only during scheduled appointments. Applies to all prescriptions.  NOTE: The following applies primarily to controlled substances (Opioid* Pain Medications).   Type of encounter (visit): For patients receiving controlled substances, face-to-face visits are required. (Not an option or up to the patient.)  Patient's responsibilities: Pain Pills: Bring all pain pills to every appointment (except for procedure appointments). Pill Bottles: Bring pills in original pharmacy bottle. Always bring the newest bottle. Bring bottle, even if empty. Medication refills: You are responsible for knowing and keeping track of what medications you take and those you need refilled. The day before your appointment: write a list of all prescriptions that need to be refilled. The day of the appointment: give the list to the admitting nurse. Prescriptions will be written only during appointments. No prescriptions will be written on procedure days. If you forget a medication: it will not be "Called in", "Faxed", or "electronically sent". You will  need to get another appointment to get these prescribed. No early refills. Do not call asking to have your prescription filled early. Prescription Accuracy: You are responsible for carefully inspecting your prescriptions before leaving our office. Have the discharge nurse carefully go over each prescription with you, before taking them home. Make sure that your name is accurately spelled, that your address is correct. Check the name and dose of your medication to make sure it is accurate. Check the number of pills, and the written instructions to make sure they are clear and accurate. Make sure that you are given enough medication to last until your next medication refill appointment. Taking Medication: Take medication as prescribed. When it comes to controlled substances, taking less pills or less frequently than prescribed is permitted and encouraged. Never take more pills than instructed. Never take medication more frequently than prescribed.  Inform other Doctors: Always inform, all of your healthcare providers, of all the medications you take. Pain Medication from other Providers: You are not allowed to accept any additional pain medication from any other Doctor or Healthcare provider. There are two exceptions to this rule. (see below) In the event that you require additional pain medication, you are responsible for notifying us, as stated below. Cough Medicine: Often these contain an opioid, such as codeine or hydrocodone. Never accept or take cough medicine containing these opioids if you are already taking an opioid* medication. The combination may cause respiratory failure and death. Medication Agreement: You are responsible for carefully reading and following our Medication Agreement. This must be signed before receiving any prescriptions from our practice. Safely store a copy of your signed Agreement. Violations to the Agreement will result in no further prescriptions. (Additional copies of our  Medication Agreement are available upon request.) Laws, Rules, & Regulations: All patients are expected to follow all Federal and State Laws, Statutes, Rules, & Regulations. Ignorance of   the Laws does not constitute a valid excuse.  Illegal drugs and Controlled Substances: The use of illegal substances (including, but not limited to marijuana and its derivatives) and/or the illegal use of any controlled substances is strictly prohibited. Violation of this rule may result in the immediate and permanent discontinuation of any and all prescriptions being written by our practice. The use of any illegal substances is prohibited. Adopted CDC guidelines & recommendations: Target dosing levels will be at or below 60 MME/day. Use of benzodiazepines** is not recommended.  Exceptions: There are only two exceptions to the rule of not receiving pain medications from other Healthcare Providers. Exception #1 (Emergencies): In the event of an emergency (i.e.: accident requiring emergency care), you are allowed to receive additional pain medication. However, you are responsible for: As soon as you are able, call our office [phone], at any time of the day or night, and leave a message stating your name, the date and nature of the emergency, and the name and dose of the medication prescribed. In the event that your call is answered by a member of our staff, make sure to document and save the date, time, and the name of the person that took your information.  Exception #2 (Planned Surgery): In the event that you are scheduled by another doctor or dentist to have any type of surgery or procedure, you are allowed (for a period no longer than 30 days), to receive additional pain medication, for the acute post-op pain. However, in this case, you are responsible for picking up a copy of our "Post-op Pain Management for Surgeons" handout, and giving it to your surgeon or dentist. This document is available at our office, and  does not require an appointment to obtain it. Simply go to our office during business hours (Monday-Thursday from 8:00 AM to 4:00 PM) (Friday 8:00 AM to 12:00 Noon) or if you have a scheduled appointment with us, prior to your surgery, and ask for it by name. In addition, you are responsible for: calling our office [phone], at any time of the day or night, and leaving a message stating your name, name of your surgeon, type of surgery, and date of procedure or surgery. Failure to comply with your responsibilities may result in termination of therapy involving the controlled substances.  *Opioid medications include: morphine, codeine, oxycodone, oxymorphone, hydrocodone, hydromorphone, meperidine, tramadol, tapentadol, buprenorphine, fentanyl, methadone. **Benzodiazepine medications include: diazepam (Valium), alprazolam (Xanax), clonazepam (Klonopine), lorazepam (Ativan), clorazepate (Tranxene), chlordiazepoxide (Librium), estazolam (Prosom), oxazepam (Serax), temazepam (Restoril), triazolam (Halcion) (Last updated: 01/01/2020) ____________________________________________________________________________________________  ____________________________________________________________________________________________  Medication Recommendations and Reminders  Applies to: All patients receiving prescriptions (written and/or electronic).  Medication Rules & Regulations: These rules and regulations exist for your safety and that of others. They are not flexible and neither are we. Dismissing or ignoring them will be considered "non-compliance" with medication therapy, resulting in complete and irreversible termination of such therapy. (See document titled "Medication Rules" for more details.) In all conscience, because of safety reasons, we cannot continue providing a therapy where the patient does not follow instructions.  Pharmacy of record:  Definition: This is the pharmacy where your electronic  prescriptions will be sent.  We do not endorse any particular pharmacy, however, we have experienced problems with [facility] not securing enough medication supply for the community. We do not restrict you in your choice of pharmacy. However, once we write for your prescriptions, we will NOT be re-sending more prescriptions to fix restricted supply problems   created by your pharmacy, or your insurance.  The pharmacy listed in the electronic medical record should be the one where you want electronic prescriptions to be sent. If you choose to change pharmacy, simply notify our nursing staff.  Recommendations: Keep all of your pain medications in a safe place, under lock and key, even if you live alone. We will NOT replace lost, stolen, or damaged medication. After you fill your prescription, take 1 week's worth of pills and put them away in a safe place. You should keep a separate, properly labeled bottle for this purpose. The remainder should be kept in the original bottle. Use this as your primary supply, until it runs out. Once it's gone, then you know that you have 1 week's worth of medicine, and it is time to come in for a prescription refill. If you do this correctly, it is unlikely that you will ever run out of medicine. To make sure that the above recommendation works, it is very important that you make sure your medication refill appointments are scheduled at least 1 week before you run out of medicine. To do this in an effective manner, make sure that you do not leave the office without scheduling your next medication management appointment. Always ask the nursing staff to show you in your prescription , when your medication will be running out. Then arrange for the receptionist to get you a return appointment, at least 7 days before you run out of medicine. Do not wait until you have 1 or 2 pills left, to come in. This is very poor planning and does not take into consideration that we may need to  cancel appointments due to bad weather, sickness, or emergencies affecting our staff. DO NOT ACCEPT A "Partial Fill": If for any reason your pharmacy does not have enough pills/tablets to completely fill or refill your prescription, do not allow for a "partial fill". The law allows the pharmacy to complete that prescription within 72 hours, without requiring a new prescription. If they do not fill the rest of your prescription within those 72 hours, you will need a separate prescription to fill the remaining amount, which we will NOT provide. If the reason for the partial fill is your insurance, you will need to talk to the pharmacist about payment alternatives for the remaining tablets, but again, DO NOT ACCEPT A PARTIAL FILL, unless you can trust your pharmacist to obtain the remainder of the pills within 72 hours.  Prescription refills and/or changes in medication(s):  Prescription refills, and/or changes in dose or medication, will be conducted only during scheduled medication management appointments. (Applies to both, written and electronic prescriptions.) No refills on procedure days. No medication will be changed or started on procedure days. No changes, adjustments, and/or refills will be conducted on a procedure day. Doing so will interfere with the diagnostic portion of the procedure. No phone refills. No medications will be "called into the pharmacy". No Fax refills. No weekend refills. No Holliday refills. No after hours refills.  Remember:  Business hours are:  Monday to Thursday 8:00 AM to 4:00 PM Provider's Schedule: Burris Matherne, MD - Appointments are:  Medication management: Monday and Wednesday 8:00 AM to 4:00 PM Procedure day: Tuesday and Thursday 7:30 AM to 4:00 PM Bilal Lateef, MD - Appointments are:  Medication management: Tuesday and Thursday 8:00 AM to 4:00 PM Procedure day: Monday and Wednesday 7:30 AM to 4:00 PM (Last update:  08/23/2019) ____________________________________________________________________________________________  ____________________________________________________________________________________________  CBD (cannabidiol) WARNING    Applicable to: All individuals currently taking or considering taking CBD (cannabidiol) and, more important, all patients taking opioid analgesic controlled substances (pain medication). (Example: oxycodone; oxymorphone; hydrocodone; hydromorphone; morphine; methadone; tramadol; tapentadol; fentanyl; buprenorphine; butorphanol; dextromethorphan; meperidine; codeine; etc.)  Legal status: CBD remains a Schedule I drug prohibited for any use. CBD is illegal with one exception. In the [place], CBD has a limited Food and Drug Administration (FDA) approval for the treatment of two specific types of epilepsy disorders. Only one CBD product has been approved by the FDA for this purpose: "Epidiolex". FDA is aware that some companies are marketing products containing cannabis and cannabis-derived compounds in ways that violate the Federal Food, Drug and Cosmetic Act (FD&C Act) and that may put the health and safety of consumers at risk. The FDA, a Federal agency, has not enforced the CBD status since 2018.   Legality: Some manufacturers ship CBD products nationally, which is illegal. Often such products are sold online and are therefore available throughout the country. CBD is openly sold in head shops and health food stores in some states where such sales have not been explicitly legalized. Selling unapproved products with unsubstantiated therapeutic claims is not only a violation of the law, but also can put patients at risk, as these products have not been proven to be safe or effective. Federal illegality makes it difficult to conduct research on CBD.  Reference: "FDA Regulation of Cannabis and Cannabis-Derived Products, Including Cannabidiol (CBD)" -  https://www.fda.gov/news-events/public-health-focus/fda-regulation-cannabis-and-cannabis-derived-products-including-cannabidiol-cbd  Warning: CBD is not FDA approved and has not undergo the same manufacturing controls as prescription drugs.  This means that the purity and safety of available CBD may be questionable. Most of the time, despite manufacturer's claims, it is contaminated with THC (delta-9-tetrahydrocannabinol - the chemical in marijuana responsible for the "HIGH").  When this is the case, the THC contaminant will trigger a positive urine drug screen (UDS) test for Marijuana (carboxy-THC). Because a positive UDS for any illicit substance is a violation of our medication agreement, your opioid analgesics (pain medicine) may be permanently discontinued.  MORE ABOUT CBD  General Information: CBD  is a derivative of the Marijuana (cannabis sativa) plant discovered in 1940. It is one of the 113 identified substances found in Marijuana. It accounts for up to 40% of the plant's extract. As of 2018, preliminary clinical studies on CBD included research for the treatment of anxiety, movement disorders, and pain. CBD is available and consumed in multiple forms, including inhalation of smoke or vapor, as an aerosol spray, and by mouth. It may be supplied as an oil containing CBD, capsules, dried cannabis, or as a liquid solution. CBD is thought not to be as psychoactive as THC (delta-9-tetrahydrocannabinol - the chemical in marijuana responsible for the "HIGH"). Studies suggest that CBD may interact with different biological target receptors in the body, including cannabinoid and other neurotransmitter receptors. As of 2018 the mechanism of action for its biological effects has not been determined.  Side-effects  Adverse reactions: Dry mouth, diarrhea, decreased appetite, fatigue, drowsiness, malaise, weakness, sleep disturbances, and others.  Drug interactions: CBC may interact with other medications  such as blood-thinners. (Last update: 09/09/2019) ____________________________________________________________________________________________  ____________________________________________________________________________________________  Drug Holidays (Slow)  What is a "Drug Holiday"? Drug Holiday: is the name given to the period of time during which a patient stops taking a medication(s) for the purpose of eliminating tolerance to the drug.  Benefits Improved effectiveness of opioids. Decreased opioid dose needed to achieve benefits. Improved pain with lesser dose.    What is tolerance? Tolerance: is the progressive decreased in effectiveness of a drug due to its repetitive use. With repetitive use, the body gets use to the medication and as a consequence, it loses its effectiveness. This is a common problem seen with opioid pain medications. As a result, a larger dose of the drug is needed to achieve the same effect that used to be obtained with a smaller dose.  How long should a "Drug Holiday" last? You should stay off of the pain medicine for at least 14 consecutive days. (2 weeks)  Should I stop the medicine "cold turkey"? No. You should always coordinate with your Pain Specialist so that he/she can provide you with the correct medication dose to make the transition as smoothly as possible.  How do I stop the medicine? Slowly. You will be instructed to decrease the daily amount of pills that you take by one (1) pill every seven (7) days. This is called a "slow downward taper" of your dose. For example: if you normally take four (4) pills per day, you will be asked to drop this dose to three (3) pills per day for seven (7) days, then to two (2) pills per day for seven (7) days, then to one (1) per day for seven (7) days, and at the end of those last seven (7) days, this is when the "Drug Holiday" would start.   Will I have withdrawals? By doing a "slow downward taper" like this one, it  is unlikely that you will experience any significant withdrawal symptoms. Typically, what triggers withdrawals is the sudden stop of a high dose opioid therapy. Withdrawals can usually be avoided by slowly decreasing the dose over a prolonged period of time. If you do not follow these instructions and decide to stop your medication abruptly, withdrawals may be possible.  What are withdrawals? Withdrawals: refers to the wide range of symptoms that occur after stopping or dramatically reducing opiate drugs after heavy and prolonged use. Withdrawal symptoms do not occur to patients that use low dose opioids, or those who take the medication sporadically. Contrary to benzodiazepine (example: Valium, Xanax, etc.) or alcohol withdrawals ("Delirium Tremens"), opioid withdrawals are not lethal. Withdrawals are the physical manifestation of the body getting rid of the excess receptors.  Expected Symptoms Early symptoms of withdrawal may include: Agitation Anxiety Muscle aches Increased tearing Insomnia Runny nose Sweating Yawning  Late symptoms of withdrawal may include: Abdominal cramping Diarrhea Dilated pupils Goose bumps Nausea Vomiting  Will I experience withdrawals? Due to the slow nature of the taper, it is very unlikely that you will experience any.  What is a slow taper? Taper: refers to the gradual decrease in dose.  (Last update: 08/23/2019) ____________________________________________________________________________________________    

## 2020-07-24 NOTE — Progress Notes (Signed)
Nursing Pain Medication Assessment:  Safety precautions to be maintained throughout the outpatient stay will include: orient to surroundings, keep bed in low position, maintain call bell within reach at all times, provide assistance with transfer out of bed and ambulation.  Medication Inspection Compliance: Pill count conducted under aseptic conditions, in front of the patient. Neither the pills nor the bottle was removed from the patient's sight at any time. Once count was completed pills were immediately returned to the patient in their original bottle.  Medication: Morphine ER (MSContin) Pill/Patch Count:  21 of 60 pills remain Pill/Patch Appearance: Markings consistent with prescribed medication Bottle Appearance: Standard pharmacy container. Clearly labeled. Filled Date: 06 / 08 / 2022 Last Medication intake:  Today

## 2020-07-25 ENCOUNTER — Other Ambulatory Visit: Payer: Self-pay | Admitting: Family Medicine

## 2020-07-29 DIAGNOSIS — E118 Type 2 diabetes mellitus with unspecified complications: Secondary | ICD-10-CM | POA: Diagnosis not present

## 2020-07-29 DIAGNOSIS — Z794 Long term (current) use of insulin: Secondary | ICD-10-CM | POA: Diagnosis not present

## 2020-07-30 DIAGNOSIS — M47816 Spondylosis without myelopathy or radiculopathy, lumbar region: Secondary | ICD-10-CM | POA: Diagnosis not present

## 2020-07-31 LAB — TOXASSURE SELECT 13 (MW), URINE

## 2020-08-06 ENCOUNTER — Other Ambulatory Visit: Payer: Self-pay | Admitting: Family Medicine

## 2020-08-06 DIAGNOSIS — M47816 Spondylosis without myelopathy or radiculopathy, lumbar region: Secondary | ICD-10-CM | POA: Diagnosis not present

## 2020-08-06 NOTE — Telephone Encounter (Signed)
RX Refill:xanax Last Seen:06-28-20 Last ordered:06-07-20

## 2020-08-08 ENCOUNTER — Ambulatory Visit (INDEPENDENT_AMBULATORY_CARE_PROVIDER_SITE_OTHER): Payer: Medicare Other | Admitting: Pharmacist

## 2020-08-08 DIAGNOSIS — F32A Depression, unspecified: Secondary | ICD-10-CM | POA: Diagnosis not present

## 2020-08-08 DIAGNOSIS — E1142 Type 2 diabetes mellitus with diabetic polyneuropathy: Secondary | ICD-10-CM

## 2020-08-08 DIAGNOSIS — I10 Essential (primary) hypertension: Secondary | ICD-10-CM | POA: Diagnosis not present

## 2020-08-08 DIAGNOSIS — E785 Hyperlipidemia, unspecified: Secondary | ICD-10-CM | POA: Diagnosis not present

## 2020-08-08 DIAGNOSIS — F419 Anxiety disorder, unspecified: Secondary | ICD-10-CM | POA: Diagnosis not present

## 2020-08-08 DIAGNOSIS — Z794 Long term (current) use of insulin: Secondary | ICD-10-CM | POA: Diagnosis not present

## 2020-08-08 NOTE — Patient Instructions (Signed)
Alan Schmidt,   See our rescheduled appointment below.   Please schedule your yearly diabetic eye exam and ask them to send the results to our office.  Please complete the colon cancer screening stool cards.   Call me with any questions or concerns!  [name], PharmD  Visit Information  PATIENT GOALS:  Goals Addressed               This Visit's Progress     Patient Stated     Medication Monitoring (pt-stated)        Patient Goals/Self-Care Activities Over the next 90 days, patient will:  - take medications as prescribed check glucose at least three times daily using CGM, document, and provide at future appointments check blood pressure periodically, document, and provide at future appointments collaborate with provider on medication access solutions          The patient verbalized understanding of instructions, educational materials, and care plan provided today and agreed to receive a mailed copy of patient instructions, educational materials, and care plan.   Plan: Telephone follow up appointment with care management team member scheduled for:  ~ 6 weeks  [name], PharmD, [place], [place] Clinical Pharmacist [employer] [facility] [employer] [phone]

## 2020-08-08 NOTE — Chronic Care Management (AMB) (Signed)
Chronic Care Management Pharmacy Note  08/08/2020 Name:  Alan Schmidt MRN:  [id number] DOB:  1945-03-15   Subjective: Alan Schmidt is an 75 y.o. year old male who is a primary patient of [name], [name], MD.  The CCM team was consulted for assistance with disease management and care coordination needs.    Engaged with patient by telephone for follow up visit in response to provider referral for pharmacy case management and/or care coordination services. Patient was unable to speak for long today.  Consent to Services:  The patient was given information about Chronic Care Management services, agreed to services, and gave verbal consent prior to initiation of services.  Please see initial visit note for detailed documentation.   Patient Care Team: [name], MD as PCP - General (Family Medicine) De Hollingshead, [place] as Pharmacist (Pharmacist) Michiel Cowboy, RN as [place] Management  Recent office visits: 5/27 - PCP - increased Ozempic to 2 mg weekly; A1c 7.9%  Recent consult visits:  6/22 - pain management - continue current regimen  Hospital visits: None in previous 6 months  Objective:  Lab Results  Component Value Date   CREATININE 1.09 03/22/2020   CREATININE 1.08 05/15/2019   CREATININE 1.04 10/26/2018    Lab Results  Component Value Date   HGBA1C 7.9 (A) 06/28/2020   Last diabetic Eye exam:  Lab Results  Component Value Date/Time   HMDIABEYEEXA Retinopathy (A) 11/10/2018 12:00 AM    Last diabetic Foot exam: No results found for: HMDIABFOOTEX      Component Value Date/Time   CHOL 141 03/22/2020 1053   TRIG 77 03/22/2020 1053   HDL 55 03/22/2020 1053   CHOLHDL 3 10/26/2018 1002   VLDL 25.2 10/26/2018 1002   LDLCALC 71 03/22/2020 1053   LDLDIRECT 53.0 05/15/2019 0920    Hepatic Function Latest Ref Rng & Units 03/22/2020 10/26/2018 09/27/2017  Total Protein 6.0 - 8.3 g/dL 7.2 7.2 7.2  Albumin 3.5 - 5.2 g/dL  4.1 4.0 4.1  AST 0 - 37 U/L _0 ALT 0 - 53 U/L 26 25 39  Alk Phosphatase 39 - 117 U/L 99 109 82  Total Bilirubin 0.2 - 1.2 mg/dL 0.4 0.3 0.4  Bilirubin, Direct 0.0 - 0.3 mg/dL - - -    Lab Results  Component Value Date/Time   TSH 2.64 09/27/2017 09:09 AM    CBC Latest Ref Rng & Units 11/17/2018 09/27/2017 10/25/2016  WBC 4.0 - 10.5 K/uL 7.8 7.7 9.4  Hemoglobin 13.0 - 17.0 g/dL 14.8 14.1 13.8  Hematocrit 39.0 - 52.0 % 46.4 43.9 41.5  Platelets 150.0 - 400.0 K/uL 250.0 255.0 278    No results found for: VD25OH  Clinical ASCVD: No  The 10-year ASCVD risk score Mikey Bussing DC Jr., et al., 2013) is: 37.6%   Values used to calculate the score:     Age: 62 years     Sex: Male     Is Non-Hispanic African American: Yes     Diabetic: Yes     Tobacco smoker: No     Systolic Blood Pressure: 233 mmHg     Is BP treated: Yes     HDL Cholesterol: 55 mg/dL     Total Cholesterol: 141 mg/dL     Social History   Tobacco Use  Smoking Status Former   Pack years: 0.00   Types: Cigarettes  Smokeless Tobacco Never   BP Readings from Last 3 Encounters:  07/24/20 139/90  06/28/20 116/64  06/04/20 130/70   Pulse Readings from Last 3 Encounters:  07/24/20 86  06/28/20 90  06/04/20 83   Wt Readings from Last 3 Encounters:  07/24/20 239 lb (108.4 kg)  06/28/20 236 lb 3.2 oz (107.1 kg)  06/04/20 239 lb (108.4 kg)    Assessment: Review of patient past medical history, allergies, medications, health status, including review of consultants reports, laboratory and other test data, was performed as part of comprehensive evaluation and provision of chronic care management services.   SDOH:  (Social Determinants of Health) assessments and interventions performed:  SDOH Interventions    Flowsheet Row Most Recent Value  SDOH Interventions   Financial Strain Interventions Intervention Not Indicated       CCM Care Plan  Allergies  Allergen Reactions   Contrast Media [Iodinated  Diagnostic Agents] Swelling   Iodine Swelling   Shellfish Allergy Nausea And Vomiting and Swelling    Medications Reviewed Today     Reviewed by Milinda Pointer, MD (Physician) on 07/24/20 at 1245  Med List Status: <None>   Medication Order Taking? Sig Documenting Provider Last Dose Status Informant  acetaminophen (TYLENOL) 500 MG tablet [id number] Yes Take 500 mg by mouth every 6 (six) hours as needed. [provider] Taking Active            Med Note Mayo Ao Jun 05, 2019 10:14 AM) Taking PRN, up to a couple times daily   ALPRAZolam (XANAX) 1 MG tablet [id number] Yes TAKE 0.5 TABLETS BY MOUTH 2 TIMES DAILY AS NEEDED FOR ANXIETY. [name], MD Taking Active   aspirin EC 81 MG tablet [id number] Yes Take 81 mg by mouth daily. [provider] Taking Active   carvedilol (COREG) 25 MG tablet [id number] Yes TAKE 1 TABLET BY MOUTH TWICE A DAY [name], MD Taking Active   celecoxib (CELEBREX) 100 MG capsule [id number] Yes TAKE 1 CAPSULE (100 MG TOTAL) BY MOUTH 2 (TWO) TIMES DAILY AS NEEDED FOR MODERATE PAIN. [name], MD Taking Active   Continuous Blood Gluc Receiver (FREESTYLE LIBRE 14 DAY READER) [place] [id number] Yes 1 Device by Does not apply route daily. Use to scan to check blood sugar up to 7 times daily; E11.42, [name], MD Taking Active   Continuous Blood Gluc Sensor (FREESTYLE LIBRE [place]) [place] [id number] Yes 1 Device by Does not apply route every 14 (fourteen) days. E11.42 [name], MD Taking Active   desoximetasone (TOPICORT) 0.25 % cream [id number] Yes APPLY CREAM TO AFFECTED AREA TWO TIMES DAILY, FOR UP TO 7 DAYS, DO NOT APPLY TO FACE [name] [name], MD Taking Active   EPINEPHrine (EPIPEN 2-PAK) 0.3 mg/0.3 mL IJ SOAJ injection [id number] Yes Inject 0.3 mg into the muscle as needed for anaphylaxis. [name], MD Taking Active   escitalopram (LEXAPRO) 20 MG tablet [id number] Yes TAKE 1 TABLET BY  MOUTH EVERY DAY [name], MD Taking Active   gabapentin (NEURONTIN) 300 MG/6ML solution [id number] Yes Take 6 mLs (300 mg total) by mouth at bedtime. [name], MD Taking Active   hydrochlorothiazide (HYDRODIURIL) 25 MG tablet [id number] Yes TAKE 1 TABLET BY MOUTH EVERY DAY [name], MD Taking Active   insulin degludec (TRESIBA FLEXTOUCH) 100 UNIT/ML FlexTouch Pen [id number] Yes INJECT 18 UNITS INTO THE SKIN DAILY AT 10 PM. [name], MD Taking Active  Med Note Earlyne Iba   Fri Jun 28, 2020 10:11 AM) 12 units daily  insulin lispro (HUMALOG KWIKPEN) 100 UNIT/ML KwikPen [id number] Yes INJECT 5-7 units INTO THE SKIN three times daily with a meal [name], MD Taking Active            Med Note [name] Jun 06, 2020 12:42 PM)    Insulin Pen Needle (BD PEN NEEDLE NANO U/F) 32G X 4 MM MISC [id number] Yes USE EVERY DAY [name], MD Taking Active   JARDIANCE 25 MG TABS tablet [id number] Yes TAKE 1 TABLET BY MOUTH DAILY [name], MD Taking Active   loratadine (CLARITIN) 10 MG tablet [id number] Yes TAKE 1 TABLET BY MOUTH EVERY DAY [name], MD Taking Active   lubiprostone (AMITIZA) 8 MCG capsule [id number] Yes Take 1 capsule (8 mcg total) by mouth 2 (two) times daily with a meal. Swallow the medication whole. Do not break or chew the medication. Milinda Pointer, MD Taking Active   metFORMIN (GLUCOPHAGE) 1000 MG tablet [id number] Yes TAKE 1 TABLET BY MOUTH TWICE A DAY WITH MEALS [name], MD Taking Active   mometasone (NASONEX) 50 MCG/ACT nasal spray [id number] Yes INSTILL 2 SPRAYS INTO EACH NOSTRIL ONCE DAILY [name], MD Taking Active   morphine (MS CONTIN) 30 MG 12 hr tablet [id number] Yes Take 1 tablet (30 mg total) by mouth every 12 (twelve) hours. Must last 30 days. Do not break tablet Milinda Pointer, MD Taking Active   morphine (MS CONTIN) 30 MG 12 hr tablet [id number] Yes Take 1  tablet (30 mg total) by mouth every 12 (twelve) hours. Must last 30 days. Do not break tablet Milinda Pointer, MD Taking Active   morphine (MS CONTIN) 30 MG 12 hr tablet [id number] Yes Take 1 tablet (30 mg total) by mouth every 12 (twelve) hours. Must last 30 days. Do not break tablet Milinda Pointer, MD Taking Active            Med Note Dossie Arbour, [place] A   Wed Jul 24, 2020 12:38 PM) FUTURE Prescription. (NOT a DUPLICATE!!) >>>DO NOT DELETE<<< (even if Expired!) See Care Coordination Note from [facility] (Dr. Dossie Arbour)   naloxone [facility]) 2 MG/2ML injection [id number] Yes Inject 1 mL (1 mg total) into the muscle as needed for up to 2 doses (for emergency use only). Always have available. Inject into thigh muscle and call [place] Milinda Pointer, MD Taking Active   olmesartan (BENICAR) 40 MG tablet [id number] Yes TAKE 1 TABLET BY MOUTH EVERY DAY [name], MD Taking Active   pantoprazole (PROTONIX) 40 MG tablet [id number] Yes TAKE 1 TABLET BY MOUTH EVERY DAY [name], MD Taking Active   pravastatin (PRAVACHOL) 40 MG tablet [id number] Yes TAKE 1 TABLET BY MOUTH EVERY DAY [name], MD Taking Active   Semaglutide, 2 MG/DOSE, (OZEMPIC, 2 MG/DOSE,) 8 MG/3ML SOPN [id number] Yes Inject 2 mg into the skin once a week. [name], MD Taking Active   sennosides-docusate sodium (SENOKOT-S) 8.6-50 MG tablet [id number] Yes Take 1 tablet by mouth daily as needed.  [provider] Taking Active   tamsulosin (FLOMAX) 0.4 MG CAPS capsule [id number] Yes TAKE 1 CAPSULE BY MOUTH 2 TIMES DAILY. [name], MD Taking Active   Wheat Dextrin [facility]) POWD [id number]  Take 6 g by mouth 3 (three) times daily before meals. (2 tsp = 6 g) Milinda Pointer, MD  Expired 03/13/20  2359   Med List Note Janett Billow, RN 07/24/20 1157): UDS 07/24/20 MR 10/22/20            Patient Active Problem List   Diagnosis Date Noted   DDD (degenerative disc  disease), cervical 07/24/2020   Non compliance w medication regimen 07/24/2020   Throat tightness 05/14/2020   Left flank pain 05/14/2020   History of kidney stones 05/14/2020   Chronic use of opiate for therapeutic purpose 04/24/2020   Left-sided back pain 03/27/2020   Excessive gas 03/27/2020   Balance problem 97/67/3419   Uncomplicated opioid dependence ([place]) 01/24/2020   Polypharmacy 01/24/2020   Chronic low back pain (1ry area of Pain) (Bilateral) w/o sciatica 12/14/2019   Pharmacologic therapy 05/29/2019   Disorder of skeletal system 05/29/2019   Problems influencing health status 05/29/2019   Fall 05/15/2019   Skin lesion 05/15/2019   Colon cancer screening 05/15/2019   Osteoarthritis involving multiple joints 02/07/2019   Shortened hamstring muscle 02/07/2019   Spondylosis without myelopathy or radiculopathy, lumbosacral region 12/27/2018   History of allergy to shellfish 12/27/2018   History of allergy to povidone-iodine topical antiseptic 12/27/2018    Class: History of   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) 03/15/2018   Thumb pain (Right) 03/03/2018   Lesion of skin of foot 01/17/2018   Chronic knee pain (Bilateral) 05/18/2017   Coccygodynia 04/19/2017   DDD (degenerative disc disease), lumbar 04/19/2017   Chronic musculoskeletal pain 04/19/2017   Chronic headache 03/16/2017   Chronic tension-type headache, intractable 02/24/2017   Vertigo 02/24/2017   Lumbar facet joint osteoarthritis (Bilateral) 02/16/2017   History of allergy to radiographic contrast media 02/16/2017   Nevus 12/10/2016   History of postoperative nausea and vomiting 11/24/2016   Encounter for general adult medical examination with abnormal findings 09/25/2016   Opioid-induced constipation (OIC) 05/14/2016   Chronic pain syndrome 02/13/2016   Chronic shoulder radicular pain (Left) 12/18/2015   Chronic cervical radicular pain (Left) 12/18/2015   Disturbance of skin  sensation 11/13/2015   Anxiety and depression 07/17/2015   Allergic rhinitis 05/22/2015   History of Vasovagal response to spinal injections 04/30/2015    Class: History of   BPH (benign prostatic hyperplasia) 04/17/2015   Diabetes ([place]) 03/19/2015   Chronic shoulder pain (Bilateral) (R>L) 03/06/2015   Occipital neuralgia (Left) 03/06/2015   Cervicogenic headache (Left) 03/06/2015   Chronic low back pain (Bilateral) (R>L) w/ sciatica (Bilateral) 12/05/2014   Lumbar facet syndrome (Bilateral) (R>L) 12/05/2014   Lumbar spondylosis 12/05/2014   Diabetic peripheral neuropathy ([place]) 12/05/2014   Long term current use of opiate analgesic 12/05/2014   Long term prescription opiate use 12/05/2014   Opiate use (60 MME/Day) 12/05/2014   Opiate dependence ([place]) 12/05/2014   Encounter for therapeutic drug level monitoring 12/05/2014   Chronic neck pain (midline over the C7 spinous processes) (L>R) 12/05/2014   Neurogenic pain 12/05/2014   Neuropathic pain 12/05/2014   Contrast dye allergy 12/05/2014   Chronic lower extremity pain (2ry area of Pain) (Bilateral) (R>L) 12/05/2014   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) (Right L5 dermatome) 12/05/2014   History of total hip replacement (Right) 12/05/2014   Chronic hip pain (Right) 12/05/2014   Class I Morbid obesity ([place]) (68% higher incidence of chronic low back pain) 12/05/2014   Essential hypertension 12/05/2014   GERD (gastroesophageal reflux disease) 12/05/2014   Obstructive sleep apnea 12/05/2014   Hyperlipidemia 12/05/2014   Chronic kidney disease (CKD) 12/05/2014   Abnormal nerve  conduction studies (severe bilateral lower extremity polyneuropathy) 12/05/2014    Immunization History  Administered Date(s) Administered   Fluad Quad(high Dose 65+) 10/26/2018   Influenza, High Dose Seasonal PF 11/21/2015, 10/27/2016, 11/06/2017, 11/24/2019   Moderna Sars-Covid-2 Vaccination 03/23/2019, 04/20/2019, 11/24/2019, 05/02/2020    Pneumococcal Conjugate-13 11/21/2015   Pneumococcal Polysaccharide-23 10/21/2017   Tdap 04/09/2015    Conditions to be addressed/monitored: HTN, HLD, and DMII  Care Plan : PharmD Medication Management  Updates made by De Hollingshead, RPH-CPP since 08/08/2020 12:00 AM     Problem: Diabetes, Hypertension, Hyperlipidemia      Long-Range Goal: Disease Prevention Progression   Start Date: 05/24/2020  This Visit's Progress: On track  Recent Progress: On track  Priority: High  Note:   Current Barriers:  Unable to achieve control of diabetes    Pharmacist Clinical Goal(s):  Over the next 90 days, patient will achieve control of diabetes as evidenced by A1c through collaboration with PharmD and provider.    Interventions: 1:1 collaboration with [name], MD regarding development and update of comprehensive plan of care as evidenced by provider attestation and co-signature Inter-disciplinary care team collaboration (see longitudinal plan of care) Comprehensive medication review performed; medication list updated in electronic medical record   Health Maintenance: Due for eye exam- reminded patient to schedule. Due for foot exam. Placed reminder in upcoming visit notes.   Medication Management: Frances Maywood his medications packaged in [facility].   Diabetes: Uncontrolled, but improved; current treatment: metformin 1000 mg BID, Jardiance 25 mg daily, Ozempic 2 mg, Tresiba 12 units daily, Humalog 6-12 units with meals Current glucose readings: using Libre 14 day Date of Download: 6/24-08/07/20 % Time CGM is active: 79% Average Glucose: 145 mg/dL Glucose Management Indicator: 6.8% Glucose Variability: 25.4 (goal <36%) Time in Goal:  - Time in range 70-180: 84% - Time above range: 16% - Time below range: 0% Observed patterns: occasional mealtime elevations, but generally within range Did not have time during visit today to discuss particular meal patterns resulting in  hyperglycemic episodes. Recommend to continue current regimen at this time.  Hypertension: Controlled per last office visit; current treatment: carvedilol 25 mg BID, HCTZ 25 mg daily, olmesartan 40 mg daily; Home BP readings: did not have a chance to discuss today Previously recommended to continue current regimen at this time. Follow w/ RN health coach   Hyperlipidemia: Controlled per last lipid panel; current treatment: pravastatin 40 mg daily Previously recommended to continue current regimen at this time   Depression/Anxiety/Chronic Pain: Uncontrolled pain but moderate mood; current treatment: escitalopram 20 mg daily, alprazolam 0.5 mg BID PRN; morphine ER 30 mg BID; celecoxib 100 mg BID PRN, acetaminophen 500 mg PRN, gabapentin 300 mg QPM (300 mg/6 mL), follows w/ Pain Management Dr. Consuela Mimes  Constipation: Amitiza 8 mcg BID; senokot-S BID PRN Per last documentation with Dr. [name], patient plans to discuss liquid gabapentin with Dr. Consuela Mimes at next appointment.  Continue collaboration with PCP, pain management, physical therapy as above.   GERD: Appropriately managed; current regimen: pantoprazole 40 mg daily Recommended to continue current regimen at this time  BPH: Appropriately managed; current regimen: tamsulosin 0.4 mg daily Recommended to continue current regimen at this time  Patient Goals/Self-Care Activities Over the next 90 days, patient will:  - take medications as prescribed check glucose at least three times daily using CGM, document, and provide at future appointments check blood pressure periodically, document, and provide at future appointments collaborate with provider on medication access solutions  Follow Up Plan: Telephone follow up appointment with care management team member scheduled for:  ~ 6 weeks     Medication Assistance: None required.  Patient affirms current coverage meets needs.  [facility]  [id number]  [place], [place]Phone: [phone]: [phone] [id number] [address] [facility] [address] [place]Phone: [phone]: [phone] Uses pill box? Yes Pt endorses 100% compliance  Follow Up:  Patient agrees to Care Plan and Follow-up.  Plan: Telephone follow up appointment with care management team member scheduled for:  ~ 6 weeks  [name] TDarnelle Maffucci PharmD, [place]ast [place] CBrightonClinical Pharmacist LOccidental [employer]role[facility] [employer] [phone]

## 2020-08-15 ENCOUNTER — Other Ambulatory Visit: Payer: Self-pay | Admitting: Family Medicine

## 2020-08-15 DIAGNOSIS — M545 Low back pain, unspecified: Secondary | ICD-10-CM | POA: Diagnosis not present

## 2020-08-15 DIAGNOSIS — M542 Cervicalgia: Secondary | ICD-10-CM | POA: Diagnosis not present

## 2020-08-15 DIAGNOSIS — E1142 Type 2 diabetes mellitus with diabetic polyneuropathy: Secondary | ICD-10-CM

## 2020-08-15 DIAGNOSIS — M25551 Pain in right hip: Secondary | ICD-10-CM | POA: Diagnosis not present

## 2020-08-15 DIAGNOSIS — Z794 Long term (current) use of insulin: Secondary | ICD-10-CM

## 2020-08-16 DIAGNOSIS — M47816 Spondylosis without myelopathy or radiculopathy, lumbar region: Secondary | ICD-10-CM | POA: Diagnosis not present

## 2020-08-20 ENCOUNTER — Ambulatory Visit: Payer: Medicare Other

## 2020-08-20 ENCOUNTER — Other Ambulatory Visit: Payer: Self-pay | Admitting: Family Medicine

## 2020-08-21 DIAGNOSIS — E118 Type 2 diabetes mellitus with unspecified complications: Secondary | ICD-10-CM | POA: Diagnosis not present

## 2020-08-21 DIAGNOSIS — Z794 Long term (current) use of insulin: Secondary | ICD-10-CM | POA: Diagnosis not present

## 2020-08-22 DIAGNOSIS — M542 Cervicalgia: Secondary | ICD-10-CM | POA: Diagnosis not present

## 2020-08-22 DIAGNOSIS — M545 Low back pain, unspecified: Secondary | ICD-10-CM | POA: Diagnosis not present

## 2020-08-23 ENCOUNTER — Other Ambulatory Visit: Payer: Self-pay | Admitting: *Deleted

## 2020-08-23 NOTE — Patient Outreach (Signed)
[facility]) Care Management  08/23/2020  Alan Schmidt 10/30/1945 [id number]  Unsuccessful outreach attempt made to patient. RN Health Coach left HIPAA compliant voicemail message along with her contact information.  Plan: RN Health Coach will call patient within the month of August.  [name] RN, BSN [place] [phone] [email or web address]

## 2020-08-29 ENCOUNTER — Telehealth: Payer: Self-pay | Admitting: Family Medicine

## 2020-08-29 ENCOUNTER — Other Ambulatory Visit (INDEPENDENT_AMBULATORY_CARE_PROVIDER_SITE_OTHER): Payer: Medicare Other

## 2020-08-29 DIAGNOSIS — Z1211 Encounter for screening for malignant neoplasm of colon: Secondary | ICD-10-CM

## 2020-08-29 LAB — FECAL OCCULT BLOOD, IMMUNOCHEMICAL: Fecal Occult Bld: POSITIVE — AB

## 2020-08-29 NOTE — Telephone Encounter (Signed)
CRITICAL VALUE STICKER  CRITICAL VALUE: Positive IFOB  RECEIVER (on-site recipient of call):Alan Schmidt  DATE & TIME NOTIFIED: 08/29/2020 0912am  MESSENGER (representative from lab):Margarita Grizzle  MD NOTIFIED: Dr [name]  TIME OF NOTIFICATION:09:16am  RESPONSE:

## 2020-08-29 NOTE — Telephone Encounter (Signed)
Please call the patient and let him know his fecal occult blood test was positive.  This indicates that they detected blood in his stool.  This means he needs to see GI to have a colonoscopy to evaluate for source of the blood.  I can place a referral once you speak with him.

## 2020-08-29 NOTE — Telephone Encounter (Signed)
A PSA test is not going to tell Alan Schmidt why he has blood in his stool.  He needs to have a colonoscopy to evaluate this.  I would suggest at least referring him to GI and have them evaluate him.  If he declines the GI referral he needs to be scheduled for an appointment to discuss this test result in person.

## 2020-08-29 NOTE — Telephone Encounter (Signed)
Called patient to discuss results. Patient verbalized understanding. Pt states he does not want to do a colonoscopy and asked if doctor could order a PSA test?

## 2020-08-30 NOTE — Telephone Encounter (Signed)
LMTCB

## 2020-09-03 DIAGNOSIS — B351 Tinea unguium: Secondary | ICD-10-CM | POA: Diagnosis not present

## 2020-09-03 DIAGNOSIS — Z794 Long term (current) use of insulin: Secondary | ICD-10-CM | POA: Diagnosis not present

## 2020-09-03 DIAGNOSIS — E1140 Type 2 diabetes mellitus with diabetic neuropathy, unspecified: Secondary | ICD-10-CM | POA: Diagnosis not present

## 2020-09-03 DIAGNOSIS — L851 Acquired keratosis [keratoderma] palmaris et plantaris: Secondary | ICD-10-CM | POA: Diagnosis not present

## 2020-09-03 NOTE — Telephone Encounter (Signed)
LMTCB

## 2020-09-03 NOTE — Telephone Encounter (Signed)
Pt called office back stating that he'd like to do a repeat test because he was very constipated and was straining .I informed pt that provider can just send a referral for GI for him to just be seen and evaluated first and then do the colonoscopy. Pt states he's agreeable to do a colonoscopy but wants to do a retest IFOB before further steps are taken.

## 2020-09-04 NOTE — Telephone Encounter (Signed)
There is no need to do a repeat test.  The results of a second test will not change my recommendations or recommended management.  He needs to see GI and have a colonoscopy given the positive fecal occult blood test.  I will still recommend that he see GI for evaluation even if the second result is negative.

## 2020-09-10 DIAGNOSIS — M4692 Unspecified inflammatory spondylopathy, cervical region: Secondary | ICD-10-CM | POA: Diagnosis not present

## 2020-09-10 DIAGNOSIS — M5441 Lumbago with sciatica, right side: Secondary | ICD-10-CM | POA: Diagnosis not present

## 2020-09-10 DIAGNOSIS — M545 Low back pain, unspecified: Secondary | ICD-10-CM | POA: Diagnosis not present

## 2020-09-10 DIAGNOSIS — M542 Cervicalgia: Secondary | ICD-10-CM | POA: Diagnosis not present

## 2020-09-11 ENCOUNTER — Other Ambulatory Visit: Payer: Self-pay | Admitting: Family Medicine

## 2020-09-11 ENCOUNTER — Ambulatory Visit (INDEPENDENT_AMBULATORY_CARE_PROVIDER_SITE_OTHER): Payer: Medicare Other | Admitting: Pharmacist

## 2020-09-11 DIAGNOSIS — Z794 Long term (current) use of insulin: Secondary | ICD-10-CM

## 2020-09-11 DIAGNOSIS — E1142 Type 2 diabetes mellitus with diabetic polyneuropathy: Secondary | ICD-10-CM | POA: Diagnosis not present

## 2020-09-11 DIAGNOSIS — F32A Depression, unspecified: Secondary | ICD-10-CM | POA: Diagnosis not present

## 2020-09-11 DIAGNOSIS — F419 Anxiety disorder, unspecified: Secondary | ICD-10-CM | POA: Diagnosis not present

## 2020-09-11 DIAGNOSIS — E785 Hyperlipidemia, unspecified: Secondary | ICD-10-CM | POA: Diagnosis not present

## 2020-09-11 DIAGNOSIS — I10 Essential (primary) hypertension: Secondary | ICD-10-CM

## 2020-09-11 MED ORDER — OZEMPIC (2 MG/DOSE) 8 MG/3ML ~~LOC~~ SOPN: 2.0000 mg | PEN_INJECTOR | SUBCUTANEOUS | 3 refills | Status: DC

## 2020-09-11 NOTE — Patient Instructions (Signed)
Alan Schmidt,   It was great to talk to you today!  INCREASE Ozempic to 2 mg weekly. Please call [facility] before you run out of the 1 mg that you have.   Continue Tresiba 12 units daily and Humalog ~ 7 units with each meal. Please take Humalog BEFORE your meal.   We talked about how to treat low blood sugars. If you have a low blood sugar <70, please eat 15 grams of carbohydrates (4 oz of juice, soda, 4 glucose tablets, 3-4 pieces of hard candy). Wait 15 minutes and then recheck your blood sugar. If still <70, then eat another 15 grams of carbohydrates. Wait another 15 minutes, then recheck. Continue this until your sugar is >70. Then, eat a snack with protein in it.   Please DO NOT take ibuprofen in combination with celecoxib. These are the same type of medication and in combination can harm your kidneys or increase your risk for a stomach bleed.   Muscle relaxers like methocarbamol, anti-anxiety medications like alprazolam, and opioids like oxycodone can all cause sedation, sleepiness, dizziness. This increases your risk for an opioid overdose. Please avoid combining methocarbamol and alprazolam.   Call me with any questions or concerns!  [name], PharmD [phone]  Visit Information  PATIENT GOALS:  Goals Addressed               This Visit's Progress     Patient Stated     Medication Monitoring (pt-stated)        Patient Goals/Self-Care Activities Over the next 90 days, patient will:  - take medications as prescribed check glucose at least three times daily using CGM, document, and provide at future appointments check blood pressure periodically, document, and provide at future appointments collaborate with provider on medication access solutions        Patient verbalizes understanding of instructions provided today and agrees to view in [place].    Plan: Telephone follow up appointment with care management team member scheduled for:  ~  8 weeks   [name], PharmD, [place], [place] Clinical Pharmacist [employer] at [employer] [phone]

## 2020-09-11 NOTE — Chronic Care Management (AMB) (Signed)
Chronic Care Management Pharmacy Note  09/11/2020 Name:  Alan Schmidt MRN:  [id number] DOB:  10-25-1945   Subjective: Alan Schmidt is an 75 y.o. year old male who is a primary patient of [name], [name], MD.  The CCM team was consulted for assistance with disease management and care coordination needs.    Engaged with patient by telephone for follow up visit in response to provider referral for pharmacy case management and/or care coordination services.   Consent to Services:  The patient was given information about Chronic Care Management services, agreed to services, and gave verbal consent prior to initiation of services.  Please see initial visit note for detailed documentation.   Patient Care Team: [name], MD as PCP - General (Family Medicine) De Hollingshead, [place] as Pharmacist (Pharmacist) Michiel Cowboy, RN as [place] Management  Objective:  Lab Results  Component Value Date   CREATININE 1.09 03/22/2020   CREATININE 1.08 05/15/2019   CREATININE 1.04 10/26/2018    Lab Results  Component Value Date   HGBA1C 7.9 (A) 06/28/2020   Last diabetic Eye exam:  Lab Results  Component Value Date/Time   HMDIABEYEEXA Retinopathy (A) 11/10/2018 12:00 AM    Last diabetic Foot exam: No results found for: HMDIABFOOTEX      Component Value Date/Time   CHOL 141 03/22/2020 1053   TRIG 77 03/22/2020 1053   HDL 55 03/22/2020 1053   CHOLHDL 3 10/26/2018 1002   VLDL 25.2 10/26/2018 1002   LDLCALC 71 03/22/2020 1053   LDLDIRECT 53.0 05/15/2019 0920    Hepatic Function Latest Ref Rng & Units 03/22/2020 10/26/2018 09/27/2017  Total Protein 6.0 - 8.3 g/dL 7.2 7.2 7.2  Albumin 3.5 - 5.2 g/dL 4.1 4.0 4.1  AST 0 - 37 U/L _0 ALT 0 - 53 U/L 26 25 39  Alk Phosphatase 39 - 117 U/L 99 109 82  Total Bilirubin 0.2 - 1.2 mg/dL 0.4 0.3 0.4  Bilirubin, Direct 0.0 - 0.3 mg/dL - - -    Lab Results  Component Value Date/Time   TSH  2.64 09/27/2017 09:09 AM    CBC Latest Ref Rng & Units 11/17/2018 09/27/2017 10/25/2016  WBC 4.0 - 10.5 K/uL 7.8 7.7 9.4  Hemoglobin 13.0 - 17.0 g/dL 14.8 14.1 13.8  Hematocrit 39.0 - 52.0 % 46.4 43.9 41.5  Platelets 150.0 - 400.0 K/uL 250.0 255.0 278    No results found for: VD25OH  Clinical ASCVD: No  The 10-year ASCVD risk score Mikey Bussing DC Jr., et al., 2013) is: 37.6%   Values used to calculate the score:     Age: 49 years     Sex: Male     Is Non-Hispanic African American: Yes     Diabetic: Yes     Tobacco smoker: No     Systolic Blood Pressure: 073 mmHg     Is BP treated: Yes     HDL Cholesterol: 55 mg/dL     Total Cholesterol: 141 mg/dL    Other: (CHADS2VASc if Afib, PHQ9 if depression, MMRC or CAT for COPD, ACT, DEXA)  Social History   Tobacco Use  Smoking Status Former   Types: Cigarettes  Smokeless Tobacco Never   BP Readings from Last 3 Encounters:  07/24/20 139/90  06/28/20 116/64  06/04/20 130/70   Pulse Readings from Last 3 Encounters:  07/24/20 86  06/28/20 90  06/04/20 83   Wt Readings from Last 3 Encounters:  07/24/20 239 lb (108.4 kg)  06/28/20 236 lb 3.2 oz (107.1 kg)  06/04/20 239 lb (108.4 kg)    Assessment: Review of patient past medical history, allergies, medications, health status, including review of consultants reports, laboratory and other test data, was performed as part of comprehensive evaluation and provision of chronic care management services.   SDOH:  (Social Determinants of Health) assessments and interventions performed:  SDOH Interventions    Flowsheet Row Most Recent Value  SDOH Interventions   Financial Strain Interventions Intervention Not Indicated       CCM Care Plan  Allergies  Allergen Reactions   Contrast Media [Iodinated Diagnostic Agents] Swelling   Iodine Swelling   Shellfish Allergy Nausea And Vomiting and Swelling    Medications Reviewed Today     Reviewed by Milinda Pointer, MD (Physician) on  07/24/20 at 1245  Med List Status: <None>   Medication Order Taking? Sig Documenting Provider Last Dose Status Informant  acetaminophen (TYLENOL) 500 MG tablet [id number] Yes Take 500 mg by mouth every 6 (six) hours as needed. [provider] Taking Active            Med Note Mayo Ao Jun 05, 2019 10:14 AM) Taking PRN, up to a couple times daily   ALPRAZolam (XANAX) 1 MG tablet [id number] Yes TAKE 0.5 TABLETS BY MOUTH 2 TIMES DAILY AS NEEDED FOR ANXIETY. [name], MD Taking Active   aspirin EC 81 MG tablet [id number] Yes Take 81 mg by mouth daily. [provider] Taking Active   carvedilol (COREG) 25 MG tablet [id number] Yes TAKE 1 TABLET BY MOUTH TWICE A DAY [name], MD Taking Active   celecoxib (CELEBREX) 100 MG capsule [id number] Yes TAKE 1 CAPSULE (100 MG TOTAL) BY MOUTH 2 (TWO) TIMES DAILY AS NEEDED FOR MODERATE PAIN. [name], MD Taking Active   Continuous Blood Gluc Receiver (FREESTYLE LIBRE 14 DAY READER) [place] [id number] Yes 1 Device by Does not apply route daily. Use to scan to check blood sugar up to 7 times daily; E11.42, [name], MD Taking Active   Continuous Blood Gluc Sensor (FREESTYLE LIBRE [place]) [place] [id number] Yes 1 Device by Does not apply route every 14 (fourteen) days. E11.42 [name], MD Taking Active   desoximetasone (TOPICORT) 0.25 % cream [id number] Yes APPLY CREAM TO AFFECTED AREA TWO TIMES DAILY, FOR UP TO 7 DAYS, DO NOT APPLY TO FACE [name] [name], MD Taking Active   EPINEPHrine (EPIPEN 2-PAK) 0.3 mg/0.3 mL IJ SOAJ injection [id number] Yes Inject 0.3 mg into the muscle as needed for anaphylaxis. [name], MD Taking Active   escitalopram (LEXAPRO) 20 MG tablet [id number] Yes TAKE 1 TABLET BY MOUTH EVERY DAY [name], MD Taking Active   gabapentin (NEURONTIN) 300 MG/6ML solution [id number] Yes Take 6 mLs (300 mg total) by mouth at bedtime. [name], MD Taking  Active   hydrochlorothiazide (HYDRODIURIL) 25 MG tablet [id number] Yes TAKE 1 TABLET BY MOUTH EVERY DAY [name], MD Taking Active   insulin degludec (TRESIBA FLEXTOUCH) 100 UNIT/ML FlexTouch Pen [id number] Yes INJECT 18 UNITS INTO THE SKIN DAILY AT 10 PM. [name], MD Taking Active            Med Note Albin Felling, [facility] E   Fri Jun 28, 2020 10:11 AM) 12 units daily  insulin lispro (HUMALOG KWIKPEN) 100 UNIT/ML KwikPen [id number] Yes INJECT 5-7 units INTO THE SKIN three times  daily with a meal [name], MD Taking Active            Med Note [name] Jun 06, 2020 12:42 PM)    Insulin Pen Needle (BD PEN NEEDLE NANO U/F) 32G X 4 MM MISC [id number] Yes USE EVERY DAY [name], MD Taking Active   JARDIANCE 25 MG TABS tablet [id number] Yes TAKE 1 TABLET BY MOUTH DAILY [name], MD Taking Active   loratadine (CLARITIN) 10 MG tablet [id number] Yes TAKE 1 TABLET BY MOUTH EVERY DAY [name], MD Taking Active   lubiprostone (AMITIZA) 8 MCG capsule [id number] Yes Take 1 capsule (8 mcg total) by mouth 2 (two) times daily with a meal. Swallow the medication whole. Do not break or chew the medication. Milinda Pointer, MD Taking Active   metFORMIN (GLUCOPHAGE) 1000 MG tablet [id number] Yes TAKE 1 TABLET BY MOUTH TWICE A DAY WITH MEALS [name], MD Taking Active   mometasone (NASONEX) 50 MCG/ACT nasal spray [id number] Yes INSTILL 2 SPRAYS INTO EACH NOSTRIL ONCE DAILY [name], MD Taking Active   morphine (MS CONTIN) 30 MG 12 hr tablet [id number] Yes Take 1 tablet (30 mg total) by mouth every 12 (twelve) hours. Must last 30 days. Do not break tablet Milinda Pointer, MD Taking Active   morphine (MS CONTIN) 30 MG 12 hr tablet [id number] Yes Take 1 tablet (30 mg total) by mouth every 12 (twelve) hours. Must last 30 days. Do not break tablet Milinda Pointer, MD Taking Active   morphine (MS CONTIN) 30 MG 12 hr tablet [id number] Yes Take  1 tablet (30 mg total) by mouth every 12 (twelve) hours. Must last 30 days. Do not break tablet Milinda Pointer, MD Taking Active            Med Note Dossie Arbour, [place] A   Wed Jul 24, 2020 12:38 PM) FUTURE Prescription. (NOT a DUPLICATE!!) >>>DO NOT DELETE<<< (even if Expired!) See Care Coordination Note from [facility] (Dr. Dossie Arbour)   naloxone [facility]) 2 MG/2ML injection [id number] Yes Inject 1 mL (1 mg total) into the muscle as needed for up to 2 doses (for emergency use only). Always have available. Inject into thigh muscle and call [place] Milinda Pointer, MD Taking Active   olmesartan (BENICAR) 40 MG tablet [id number] Yes TAKE 1 TABLET BY MOUTH EVERY DAY [name], MD Taking Active   pantoprazole (PROTONIX) 40 MG tablet [id number] Yes TAKE 1 TABLET BY MOUTH EVERY DAY [name], MD Taking Active   pravastatin (PRAVACHOL) 40 MG tablet [id number] Yes TAKE 1 TABLET BY MOUTH EVERY DAY [name], MD Taking Active   Semaglutide, 2 MG/DOSE, (OZEMPIC, 2 MG/DOSE,) 8 MG/3ML SOPN [id number] Yes Inject 2 mg into the skin once a week. [name], MD Taking Active   sennosides-docusate sodium (SENOKOT-S) 8.6-50 MG tablet [id number] Yes Take 1 tablet by mouth daily as needed.  [provider] Taking Active   tamsulosin (FLOMAX) 0.4 MG CAPS capsule [id number] Yes TAKE 1 CAPSULE BY MOUTH 2 TIMES DAILY. [name], MD Taking Active   Wheat Dextrin [facility]) POWD [id number]  Take 6 g by mouth 3 (three) times daily before meals. (2 tsp = 6 g) Milinda Pointer, MD  Expired 03/13/20 2359   Med List Note Janett Billow, [place] 07/24/20 1157): UDS 07/24/20 MR 10/22/20            Patient Active Problem List   Diagnosis  Date Noted   DDD (degenerative disc disease), cervical 07/24/2020   Non compliance w medication regimen 07/24/2020   Throat tightness 05/14/2020   Left flank pain 05/14/2020   History of kidney stones 05/14/2020   Chronic use of  opiate for therapeutic purpose 04/24/2020   Left-sided back pain 03/27/2020   Excessive gas 03/27/2020   Balance problem 11/57/2620   Uncomplicated opioid dependence ([place]) 01/24/2020   Polypharmacy 01/24/2020   Chronic low back pain (1ry area of Pain) (Bilateral) w/o sciatica 12/14/2019   Pharmacologic therapy 05/29/2019   Disorder of skeletal system 05/29/2019   Problems influencing health status 05/29/2019   Fall 05/15/2019   Skin lesion 05/15/2019   Colon cancer screening 05/15/2019   Osteoarthritis involving multiple joints 02/07/2019   Shortened hamstring muscle 02/07/2019   Spondylosis without myelopathy or radiculopathy, lumbosacral region 12/27/2018   History of allergy to shellfish 12/27/2018   History of allergy to povidone-iodine topical antiseptic 12/27/2018    Class: History of   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) 03/15/2018   Thumb pain (Right) 03/03/2018   Lesion of skin of foot 01/17/2018   Chronic knee pain (Bilateral) 05/18/2017   Coccygodynia 04/19/2017   DDD (degenerative disc disease), lumbar 04/19/2017   Chronic musculoskeletal pain 04/19/2017   Chronic headache 03/16/2017   Chronic tension-type headache, intractable 02/24/2017   Vertigo 02/24/2017   Lumbar facet joint osteoarthritis (Bilateral) 02/16/2017   History of allergy to radiographic contrast media 02/16/2017   Nevus 12/10/2016   History of postoperative nausea and vomiting 11/24/2016   Encounter for general adult medical examination with abnormal findings 09/25/2016   Opioid-induced constipation (OIC) 05/14/2016   Chronic pain syndrome 02/13/2016   Chronic shoulder radicular pain (Left) 12/18/2015   Chronic cervical radicular pain (Left) 12/18/2015   Disturbance of skin sensation 11/13/2015   Anxiety and depression 07/17/2015   Allergic rhinitis 05/22/2015   History of Vasovagal response to spinal injections 04/30/2015    Class: History of   BPH (benign prostatic  hyperplasia) 04/17/2015   Diabetes ([place]) 03/19/2015   Chronic shoulder pain (Bilateral) (R>L) 03/06/2015   Occipital neuralgia (Left) 03/06/2015   Cervicogenic headache (Left) 03/06/2015   Chronic low back pain (Bilateral) (R>L) w/ sciatica (Bilateral) 12/05/2014   Lumbar facet syndrome (Bilateral) (R>L) 12/05/2014   Lumbar spondylosis 12/05/2014   Diabetic peripheral neuropathy ([place]) 12/05/2014   Long term current use of opiate analgesic 12/05/2014   Long term prescription opiate use 12/05/2014   Opiate use (60 MME/Day) 12/05/2014   Opiate dependence ([place]) 12/05/2014   Encounter for therapeutic drug level monitoring 12/05/2014   Chronic neck pain (midline over the C7 spinous processes) (L>R) 12/05/2014   Neurogenic pain 12/05/2014   Neuropathic pain 12/05/2014   Contrast dye allergy 12/05/2014   Chronic lower extremity pain (2ry area of Pain) (Bilateral) (R>L) 12/05/2014   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) (Right L5 dermatome) 12/05/2014   History of total hip replacement (Right) 12/05/2014   Chronic hip pain (Right) 12/05/2014   Class I Morbid obesity ([place]) (68% higher incidence of chronic low back pain) 12/05/2014   Essential hypertension 12/05/2014   GERD (gastroesophageal reflux disease) 12/05/2014   Obstructive sleep apnea 12/05/2014   Hyperlipidemia 12/05/2014   Chronic kidney disease (CKD) 12/05/2014   Abnormal nerve conduction studies (severe bilateral lower extremity polyneuropathy) 12/05/2014    Immunization History  Administered Date(s) Administered   Fluad Quad(high Dose 65+) 10/26/2018   Influenza, High Dose Seasonal PF 11/21/2015, 10/27/2016, 11/06/2017,  11/24/2019   Moderna Sars-Covid-2 Vaccination 03/23/2019, 04/20/2019, 11/24/2019, 05/02/2020   Pneumococcal Conjugate-13 11/21/2015   Pneumococcal Polysaccharide-23 10/21/2017   Tdap 04/09/2015    Conditions to be addressed/monitored: HTN, HLD, and DMII  Care Plan : PharmD Medication Management   Updates made by De Hollingshead, RPH-CPP since 09/11/2020 12:00 AM     Problem: Diabetes, Hypertension, Hyperlipidemia      Long-Range Goal: Disease Prevention Progression   Start Date: 05/24/2020  This Visit's Progress: On track  Recent Progress: On track  Priority: High  Note:   Current Barriers:  Unable to achieve control of diabetes    Pharmacist Clinical Goal(s):  Over the next 90 days, patient will achieve control of diabetes as evidenced by A1c through collaboration with PharmD and provider.    Interventions: 1:1 collaboration with [name], MD regarding development and update of comprehensive plan of care as evidenced by provider attestation and co-signature Inter-disciplinary care team collaboration (see longitudinal plan of care) Comprehensive medication review performed; medication list updated in electronic medical record   Health Maintenance: Due for eye exam- reminded patient to schedule. Due for foot exam. Placed reminder in upcoming visit notes.   Medication Management: Frances Maywood his medications packaged in [facility]. Reviewed these today.   Diabetes: Uncontrolled, but improved; current treatment: metformin 1000 mg BID, Jardiance 25 mg daily, Ozempic 2 mg prescribed 2 mg but appears pharmacy has still been filling Ozempic 1 mg; Tresiba 12 units daily, Humalog 7 units with meals- says he often forgets to take before meals. Current glucose readings: using Libre 14 day Date of Download: 7/28-8/10 % Time CGM is active: 85% Average Glucose: 162 mg/dL Glucose Management Indicator: 7.2% Glucose Variability: 23.6 (goal <36%) Time in Goal:  - Time in range 70-180: 71% - Time above range: 29% - Time below range: 0% Observed patterns: mealtime elevations.  Did not have time during visit today to discuss particular meal patterns resulting in hyperglycemic episodes. Recommend to continue current regimen at this time. Reviewed importance of taking  mealtime insulin prior to meal.  Resent Ozempic 2 mg script to the pharmacy. Encouraged patient to call today to request a fill and let me know if he has issues with getting it.  Continue Tresiba 12 units daily, Humalog ~ 7 units BEFORE meals, metformin 1000 mg BID, and Jardiance 25 mg daily.   Hypertension: Controlled per last office visit; current treatment: carvedilol 25 mg BID, HCTZ 25 mg daily, olmesartan 40 mg daily; Confirmed adherence.  Home BP readings: did not have a chance to discuss today Previously recommended to continue current regimen at this time.    Hyperlipidemia: Controlled per last lipid panel; current treatment: pravastatin 40 mg daily Antiplatelet regimen: aspirin 81 mg daily - reports he sometimes take 2 tablets if two come out of the bottle Advised to only take 1 baby aspirin daily. Advised on bleed risk of aspirin, especially in combination with NSAIDs. Recommended to continue current regimen at this time   Depression/Anxiety/Chronic Pain: Uncontrolled pain but moderate mood; current treatment: escitalopram 20 mg daily, alprazolam 0.5 mg BID PRN; morphine ER 30 mg BID; celecoxib 100 mg BID, acetaminophen 500 mg PRN, gabapentin 300 mg QPM (300 mg/6 mL), follows w/ Pain Management Dr. Consuela Mimes  Constipation: Amitiza 8 mcg BID; senokot-S BID PRN Reports that he has been taking OTC ibuprofen 200 mg 1-4 tablets daily. Advised that he should not be taking ibuprofen in combination with celecoxib. Advised to discontinue ibuprofen use unless prescribed in the  future by a provider.  Per fill history, he was prescribed a prednisone dose back and methocarbamol from orthopedics yesterday. He notes [facility] not [facility] yet. Advised on side effects of prednisone, including increase in blood sugars, insomnia. Advised to take full dose in the morning. Advised on risk of sedation from methocarbamol. Advised to avoid combined use of alprazolam and methocarbamol in combination  with oxycodone.  Continue collaboration with PCP, pain management, orthopedics.   GERD: Appropriately managed; current regimen: pantoprazole 40 mg daily Recommended to continue current regimen at this time  BPH: Appropriately managed; current regimen: tamsulosin 0.4 mg BID Recommended to continue current regimen at this time  Patient Goals/Self-Care Activities Over the next 90 days, patient will:  - take medications as prescribed check glucose at least three times daily using CGM, document, and provide at future appointments check blood pressure periodically, document, and provide at future appointments collaborate with provider on medication access solutions   Follow Up Plan: Telephone follow up appointment with care management team member scheduled for:  ~ 8 weeks     Medication Assistance: None required.  Patient affirms current coverage meets needs.  [facility]  [id number] [place] [place] [address]Phone: [phone]: [phone] [id number] [address] [address]Phone: [phone]: [phone]  Follow Up:  Patient agrees to Care Plan and Follow-up.  Plan: Telephone follow up appointment with care management team member scheduled for:  ~ 8 weeks  [name] TDarnelle Maffucci PharmD, BSoper CRedvaleClinical Pharmacist LOccidental Petroleumat BJohnson & [phone]

## 2020-09-12 NOTE — Telephone Encounter (Signed)
Patient was informed, he stated he will be follow ing up on some recommendations on GI specialist and disconnected the call.

## 2020-09-13 ENCOUNTER — Ambulatory Visit: Payer: Medicare Other | Admitting: Pharmacist

## 2020-09-13 ENCOUNTER — Other Ambulatory Visit: Payer: Self-pay | Admitting: *Deleted

## 2020-09-13 ENCOUNTER — Other Ambulatory Visit: Payer: Self-pay | Admitting: Family Medicine

## 2020-09-13 DIAGNOSIS — E1142 Type 2 diabetes mellitus with diabetic polyneuropathy: Secondary | ICD-10-CM

## 2020-09-13 DIAGNOSIS — E785 Hyperlipidemia, unspecified: Secondary | ICD-10-CM

## 2020-09-13 DIAGNOSIS — Z794 Long term (current) use of insulin: Secondary | ICD-10-CM

## 2020-09-13 DIAGNOSIS — I10 Essential (primary) hypertension: Secondary | ICD-10-CM

## 2020-09-13 NOTE — Patient Instructions (Signed)
Goals Addressed             This Visit's Progress    [facility]) Obtain Eye Exam-Diabetes Type 2       Timeframe:  Long-Range Goal Priority:  Medium Start Date:  09/13/20                           Expected End Date: 01/01/21                   Follow Up Date 01/01/21    - schedule appointment with eye doctor    Why is this important?   Eye check-ups are important when you have diabetes.  Vision loss can be prevented.    Notes: 09/13/20: Nurse discussed importance of having annual eye exams when you have diabetes to prevent vision loss. Patient stated he would call an eye clinic in [place] of which he really liked the eye doctor and he would make an eye exam appointment.      [facility]) Patient will verbalize continuation of monitoring and managing his diabetes for the next 90 days       Timeframe:  Long-Range Goal Priority:  High Start Date: 05/07/20                            Expected End Date: 05/07/21                      Follow Up Date 01/01/21    - check blood sugar at prescribed times - check blood sugar if I feel it is too high or too low - enter blood sugar readings and medication or insulin into daily log - take the blood sugar log to all doctor visits   -Encouraged patient to check his blood sugar before meals and before bed via CGM for better diabetes control and to prevent sugar drops Nurse congratulated patient on decreasing his A1c to 8.1 form 7.9 Discussed eating consistently throughout the day to prevent blood sugar drops.  Encouraged patient to limit sugar and carbohydrates in his diet Encouraged patient to do chair exercises and physical therapy exercises as tolerated   Why is this important?   Checking your blood sugar at home helps to keep it from getting very high or very low.  Writing the results in a diary or log helps the doctor know how to care for you.  Your blood sugar log should have the time, date and the results.  Also, write down the amount of insulin or  other medicine that you take.  Other information, like what you ate, exercise done and how you were feeling, will also be helpful.     Notes: Updated 09/13/20: Patient explains that his blood sugar is currently running high due to taking prednisone. Patient reports that his blood sugar has remained below 300. Before beginning prednisone patient states his blood sugar ranges were 117- 170's. Patient states he takes his blood sugar 2-3 times daily. Nurse encouraged patient to take his blood sugar before meals and at bedtime through his CGM. Adding this will help him stay in better control of his diabetes and will allow him to isolate extreme highs and lows. Patient states that he is not having as many low blood sugar drops. Nurse encouraged patient to eat at consistent times due to his insulin and diabetic medication and to limit his carbohydrates and sugar especially now  that he is taking prednisone. Patient states he will call his pharmacy to inquire about 62m dose of Ozempic being ready. Nurse discussed calling PCP if his pharmacy has not received the new prescription.  05/07/20: Nurse will send calendar booklet which has education and areas to write down his blood sugar values. Nurse will send Glucerna coupons       COMPLETED: Patient will report an A1c reduction of 0.2-0.5 points within the next 90 days       [place](see longtitudinal plan of care for additional care plan information)  Objective:  Lab Results  Component Value Date   HGBA1C 8.4 (H) 05/15/2019   Lab Results  Component Value Date   CREATININE 1.08 05/15/2019   CREATININE 1.04 10/26/2018   CREATININE 0.94 06/10/2018   No results found for: EGFR  Current Barriers:  Knowledge Deficits related to basic Diabetes pathophysiology and self care/management  Case Manager Clinical Goal(s):  Over the next 90 days, patient will demonstrate improved adherence to prescribed treatment plan for diabetes self care/management as  evidenced by:  Verbalize daily monitoring and recording of CBG within 90 days Verbalize adherence to ADA/ carb modified diet within the next 90 days Verbalize exercise 2-3 other than physical therapy days/week Verbalize adherence to prescribed medication regimen within the next 90 days  Verbalize completion of annual eye exam within the next 90 days  Interventions:  Reviewed medications with patient and discussed importance of medication adherence Discussed plans with patient for ongoing care management follow up and provided patient with direct contact information for care management team Provided patient with written educational materials related to hypo and hyperglycemia and importance of correct treatment Encouraged patient to do chair exercise 2-3 times weekly, discussed eating a snack at night with protein to prevent hypoglycemia during sleeping hours, nurse will send Glucerna coupons , will send exercise program booklet Encouraged patient to check his blood sugar before meals and before bed via CGM for better diabetes control and to prevent sugar drops Nurse congratulated patient on decreasing his A1c to 8.1 from 8.4   Patient Self Care Activities:  Self administers oral medications as prescribed Self administers insulin as prescribed Self administers injectable DM medication (Tyler Aas as prescribed Adheres to prescribed ADA/carb modified Patient discussed that he has not been checking his blood sugar before meals and before bed. Education was provided of the importance of this for best diabetic control.   Timeframe:  Long-Range Goal Priority:  High Start Date:  08/28/19                           Expected End Date: 10/01/20 Follow Up Date: 08/01/20  Updated 05/07/20                      09/13/20 Resolved due to duplicate goals

## 2020-09-13 NOTE — Patient Instructions (Signed)
Visit Information  PATIENT GOALS:  Goals Addressed               This Visit's Progress     Patient Stated     Medication Monitoring (pt-stated)        Patient Goals/Self-Care Activities Over the next 90 days, patient will:  - take medications as prescribed check glucose at least three times daily using CGM, document, and provide at future appointments check blood pressure periodically, document, and provide at future appointments collaborate with provider on medication access solutions         Patient verbalizes understanding of instructions provided today and agrees to view in [place].    Plan: Telephone follow up appointment with care management team member scheduled for:  ~ 8 weeks  [name], PharmD, [place], [place] Clinical Pharmacist [employer] [facility] [employer] [phone]

## 2020-09-13 NOTE — Chronic Care Management (AMB) (Signed)
Chronic Care Management Pharmacy Note  09/13/2020 Name:  Alan Schmidt MRN:  [id number] DOB:  25-Mar-1945  Subjective: Alan Schmidt is an 75 y.o. year old male who is a primary patient of Alan Schmidt, Alan Adam, MD.  The CCM team was consulted for assistance with disease management and care coordination needs.    Engaged with patient by telephone for follow up visit in response to provider referral for pharmacy case management and/or care coordination services.   Consent to Services:  The patient was given information about Chronic Care Management services, agreed to services, and gave verbal consent prior to initiation of services.  Please see initial visit note for detailed documentation.   Patient Care Team: [name], MD as PCP - General (Family Medicine) De Hollingshead, [place] as Pharmacist (Pharmacist) Michiel Cowboy, RN as [place] Management  Objective:  Lab Results  Component Value Date   CREATININE 1.09 03/22/2020   CREATININE 1.08 05/15/2019   CREATININE 1.04 10/26/2018    Lab Results  Component Value Date   HGBA1C 7.9 (A) 06/28/2020   Last diabetic Eye exam:  Lab Results  Component Value Date/Time   HMDIABEYEEXA Retinopathy (A) 11/10/2018 12:00 AM    Last diabetic Foot exam: No results found for: HMDIABFOOTEX      Component Value Date/Time   CHOL 141 03/22/2020 1053   TRIG 77 03/22/2020 1053   HDL 55 03/22/2020 1053   CHOLHDL 3 10/26/2018 1002   VLDL 25.2 10/26/2018 1002   LDLCALC 71 03/22/2020 1053   LDLDIRECT 53.0 05/15/2019 0920    Hepatic Function Latest Ref Rng & Units 03/22/2020 10/26/2018 09/27/2017  Total Protein 6.0 - 8.3 g/dL 7.2 7.2 7.2  Albumin 3.5 - 5.2 g/dL 4.1 4.0 4.1  AST 0 - 37 U/L 19 23 27   ALT 0 - 53 U/L 26 25 39  Alk Phosphatase 39 - 117 U/L 99 109 82  Total Bilirubin 0.2 - 1.2 mg/dL 0.4 0.3 0.4  Bilirubin, Direct 0.0 - 0.3 mg/dL - - -    Lab Results  Component Value Date/Time   TSH 2.64  09/27/2017 09:09 AM    CBC Latest Ref Rng & Units 11/17/2018 09/27/2017 10/25/2016  WBC 4.0 - 10.5 K/uL 7.8 7.7 9.4  Hemoglobin 13.0 - 17.0 g/dL 14.8 14.1 13.8  Hematocrit 39.0 - 52.0 % 46.4 43.9 41.5  Platelets 150.0 - 400.0 K/uL 250.0 255.0 278    No results found for: VD25OH  Clinical ASCVD: No  The 10-year ASCVD risk score Mikey Bussing DC Jr., et al., 2013) is: 37.6%   Values used to calculate the score:     Age: 49 years     Sex: Male     Is Non-Hispanic African American: Yes     Diabetic: Yes     Tobacco smoker: No     Systolic Blood Pressure: 387 mmHg     Is BP treated: Yes     HDL Cholesterol: 55 mg/dL     Total Cholesterol: 141 mg/dL    Other: (CHADS2VASc if Afib, PHQ9 if depression, MMRC or CAT for COPD, ACT, DEXA)  Social History   Tobacco Use  Smoking Status Former   Types: Cigarettes  Smokeless Tobacco Never   BP Readings from Last 3 Encounters:  07/24/20 139/90  06/28/20 116/64  06/04/20 130/70   Pulse Readings from Last 3 Encounters:  07/24/20 86  06/28/20 90  06/04/20 83   Wt Readings from Last 3 Encounters:  07/24/20  239 lb (108.4 kg)  06/28/20 236 lb 3.2 oz (107.1 kg)  06/04/20 239 lb (108.4 kg)    Assessment: Review of patient past medical history, allergies, medications, health status, including review of consultants reports, laboratory and other test data, was performed as part of comprehensive evaluation and provision of chronic care management services.   SDOH:  (Social Determinants of Health) assessments and interventions performed:    CCM Care Plan  Allergies  Allergen Reactions   Contrast Media [Iodinated Diagnostic Agents] Swelling   Iodine Swelling   Shellfish Allergy Nausea And Vomiting and Swelling    Medications Reviewed Today     Reviewed by Michiel Cowboy, RN (Registered Nurse) on 09/13/20 at [place] List Status: <None>   Medication Order Taking? Sig Documenting Provider Last Dose Status Informant  acetaminophen (TYLENOL)  500 MG tablet [id number] Yes Take 500 mg by mouth every 6 (six) hours as needed. [provider] Taking Active            Med Note Darnelle Maffucci, Arville Lime   Wed Sep 11, 2020  1:21 PM)    ALPRAZolam Duanne Moron) 1 MG tablet [id number] Yes TAKE 0.5 TABLETS BY MOUTH 2 TIMES DAILY AS NEEDED FOR ANXIETY. McLean-Scocuzza, Nino Glow, MD Taking Active   aspirin EC 81 MG tablet [id number] Yes Take 81 mg by mouth daily. [provider] Taking Active   buPROPion (WELLBUTRIN XL) 150 MG 24 hr tablet [id number] Yes Take 300 mg by mouth daily. [provider] Taking Active   carvedilol (COREG) 25 MG tablet [id number] Yes TAKE 1 TABLET BY MOUTH TWICE A DAY [name], MD Taking Active   celecoxib (CELEBREX) 100 MG capsule [id number] Yes TAKE 1 CAPSULE (100 MG TOTAL) BY MOUTH 2 (TWO) TIMES DAILY AS NEEDED FOR MODERATE PAIN. [name], MD Taking Active   Continuous Blood Gluc Receiver (FREESTYLE LIBRE 14 DAY READER) [place] [id number] Yes 1 Device by Does not apply route daily. Use to scan to check blood sugar up to 7 times daily; E11.42, [name], MD Taking Active   Continuous Blood Gluc Sensor (FREESTYLE LIBRE [place]) [place] [id number] Yes 1 Device by Does not apply route every 14 (fourteen) days. E11.42 [name], MD Taking Active   desoximetasone (TOPICORT) 0.25 % cream [id number] Yes APPLY CREAM TO AFFECTED AREA TWO TIMES DAILY, FOR UP TO 7 DAYS, DO NOT APPLY TO FACE [name], MD Taking Active   EPINEPHrine (EPIPEN 2-PAK) 0.3 mg/0.3 mL IJ SOAJ injection [id number] No Inject 0.3 mg into the muscle as needed for anaphylaxis.  Patient not taking: No sig reported   [name], MD Not Taking Active   escitalopram (LEXAPRO) 20 MG tablet [id number] Yes TAKE 1 TABLET BY MOUTH EVERY DAY [name], MD Taking Active   gabapentin (NEURONTIN) 300 MG/6ML solution [id number]  Take 6 mLs (300 mg total) by mouth at bedtime. [name], MD  Expired  07/28/20 2359   hydrochlorothiazide (HYDRODIURIL) 25 MG tablet [id number] Yes TAKE 1 TABLET BY MOUTH EVERY DAY [name], MD Taking Active   insulin degludec (TRESIBA FLEXTOUCH) 100 UNIT/ML FlexTouch Pen [id number] Yes INJECT 18 UNITS INTO THE SKIN DAILY AT 10 PM. [name], MD Taking Active            Med Note Albin Felling, [facility] E   Fri Jun 28, 2020 10:11 AM) 12 units daily  insulin lispro (HUMALOG KWIKPEN) 100 UNIT/ML KwikPen [id number] Yes INJECT 5-7 units  INTO THE SKIN three times daily with a meal [name], MD Taking Active            Med Note Darnelle Maffucci, Arville Lime   Wed Sep 11, 2020  1:11 PM) 7 units up to TID with meals  Insulin Pen Needle (BD PEN NEEDLE NANO U/F) 32G X 4 MM MISC [id number] Yes USE EVERY DAY [name], MD Taking Active   JARDIANCE 25 MG TABS tablet [id number] Yes TAKE 1 TABLET BY MOUTH DAILY [name], MD Taking Active   loratadine (CLARITIN) 10 MG tablet [id number] Yes TAKE 1 TABLET BY MOUTH EVERY DAY [name], MD Taking Active   lubiprostone (AMITIZA) 8 MCG capsule [id number] Yes Take 1 capsule (8 mcg total) by mouth 2 (two) times daily with a meal. Swallow the medication whole. Do not break or chew the medication. Milinda Pointer, MD Taking Active            Med Note Kelby Aline Sep 11, 2020  1:24 PM) Taking PRN  metFORMIN (GLUCOPHAGE) 1000 MG tablet [id number] Yes TAKE 1 TABLET BY MOUTH TWICE A DAY WITH MEALS [name], MD Taking Active   methocarbamol (ROBAXIN) 500 MG tablet [id number] Yes Take 500 mg by mouth 2 (two) times daily. [provider] Taking Active   mometasone (NASONEX) 50 MCG/ACT nasal spray [id number] Yes INSTILL 2 SPRAYS INTO EACH NOSTRIL ONCE DAILY Alan Schmidt Alan Adam, MD Taking Active   morphine (MS CONTIN) 30 MG 12 hr tablet [id number]  Take 1 tablet (30 mg total) by mouth every 12 (twelve) hours. Must last 30 days. Do not break tablet Milinda Pointer, MD  Expired 08/23/20  2359   morphine (MS CONTIN) 30 MG 12 hr tablet [id number] Yes Take 1 tablet (30 mg total) by mouth every 12 (twelve) hours. Must last 30 days. Do not break tablet Milinda Pointer, MD Taking Active   morphine (MS CONTIN) 30 MG 12 hr tablet [id number] Yes Take 1 tablet (30 mg total) by mouth every 12 (twelve) hours. Must last 30 days. Do not break tablet Milinda Pointer, MD Taking Active            Med Note Dossie Arbour, [place] A   Wed Jul 24, 2020 12:38 PM) FUTURE Prescription. (NOT a DUPLICATE!!) >>>DO NOT DELETE<<< (even if Expired!) See Care Coordination Note from [facility] (Dr. Dossie Arbour)   naloxone [facility]) 2 MG/2ML injection [id number] Yes Inject 1 mL (1 mg total) into the muscle as needed for up to 2 doses (for emergency use only). Always have available. Inject into thigh muscle and call 911 Milinda Pointer, MD Taking Active   olmesartan (BENICAR) 40 MG tablet [id number] Yes TAKE 1 TABLET BY MOUTH EVERY DAY [name], MD Taking Active   pantoprazole (PROTONIX) 40 MG tablet [id number] Yes TAKE 1 TABLET BY MOUTH EVERY DAY [name], MD Taking Active   pravastatin (PRAVACHOL) 40 MG tablet [id number] Yes TAKE 1 TABLET BY MOUTH EVERY DAY [name], MD Taking Active   predniSONE (STERAPRED UNI-PAK 21 TAB) 10 MG (21) TBPK tablet [id number] Yes Take by mouth. Take as directed [provider] Taking Active   Semaglutide, 2 MG/DOSE, (OZEMPIC, 2 MG/DOSE,) 8 MG/3ML SOPN [id number] Yes Inject 2 mg into the skin once a week. [name], MD Taking Active            Med Note Darnelle Maffucci, Arville Lime   Wed Sep 11, 2020  1:16 PM)  Taking 1 mg weekly - pharmacy has not filled yet  sennosides-docusate sodium (SENOKOT-S) 8.6-50 MG tablet [id number] Yes Take 1 tablet by mouth daily as needed.  [provider] Taking Active   tamsulosin (FLOMAX) 0.4 MG CAPS capsule [id number] Yes TAKE 1 CAPSULE BY MOUTH 2 TIMES DAILY. [name], MD Taking Active   Wheat  Dextrin [facility]) POWD [id number]  Take 6 g by mouth 3 (three) times daily before meals. (2 tsp = 6 g) Milinda Pointer, MD  Expired 03/13/20 2359   Med List Note Janett Billow, RN 07/24/20 1157): UDS 07/24/20 MR 10/22/20            Patient Active Problem List   Diagnosis Date Noted   DDD (degenerative disc disease), cervical 07/24/2020   Non compliance w medication regimen 07/24/2020   Throat tightness 05/14/2020   Left flank pain 05/14/2020   History of kidney stones 05/14/2020   Chronic use of opiate for therapeutic purpose 04/24/2020   Left-sided back pain 03/27/2020   Excessive gas 03/27/2020   Balance problem 82/42/3536   Uncomplicated opioid dependence ([place]) 01/24/2020   Polypharmacy 01/24/2020   Chronic low back pain (1ry area of Pain) (Bilateral) w/o sciatica 12/14/2019   Pharmacologic therapy 05/29/2019   Disorder of skeletal system 05/29/2019   Problems influencing health status 05/29/2019   Fall 05/15/2019   Skin lesion 05/15/2019   Colon cancer screening 05/15/2019   Osteoarthritis involving multiple joints 02/07/2019   Shortened hamstring muscle 02/07/2019   Spondylosis without myelopathy or radiculopathy, lumbosacral region 12/27/2018   History of allergy to shellfish 12/27/2018   History of allergy to povidone-iodine topical antiseptic 12/27/2018    Class: History of   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) 03/15/2018   Thumb pain (Right) 03/03/2018   Lesion of skin of foot 01/17/2018   Chronic knee pain (Bilateral) 05/18/2017   Coccygodynia 04/19/2017   DDD (degenerative disc disease), lumbar 04/19/2017   Chronic musculoskeletal pain 04/19/2017   Chronic headache 03/16/2017   Chronic tension-type headache, intractable 02/24/2017   Vertigo 02/24/2017   Lumbar facet joint osteoarthritis (Bilateral) 02/16/2017   History of allergy to radiographic contrast media 02/16/2017   Nevus 12/10/2016   History of postoperative  nausea and vomiting 11/24/2016   Encounter for general adult medical examination with abnormal findings 09/25/2016   Opioid-induced constipation (OIC) 05/14/2016   Chronic pain syndrome 02/13/2016   Chronic shoulder radicular pain (Left) 12/18/2015   Chronic cervical radicular pain (Left) 12/18/2015   Disturbance of skin sensation 11/13/2015   Anxiety and depression 07/17/2015   Allergic rhinitis 05/22/2015   History of Vasovagal response to spinal injections 04/30/2015    Class: History of   BPH (benign prostatic hyperplasia) 04/17/2015   Diabetes ([place]) 03/19/2015   Chronic shoulder pain (Bilateral) (R>L) 03/06/2015   Occipital neuralgia (Left) 03/06/2015   Cervicogenic headache (Left) 03/06/2015   Chronic low back pain (Bilateral) (R>L) w/ sciatica (Bilateral) 12/05/2014   Lumbar facet syndrome (Bilateral) (R>L) 12/05/2014   Lumbar spondylosis 12/05/2014   Diabetic peripheral neuropathy ([place]) 12/05/2014   Long term current use of opiate analgesic 12/05/2014   Long term prescription opiate use 12/05/2014   Opiate use (60 MME/Day) 12/05/2014   Opiate dependence ([place]) 12/05/2014   Encounter for therapeutic drug level monitoring 12/05/2014   Chronic neck pain (midline over the C7 spinous processes) (L>R) 12/05/2014   Neurogenic pain 12/05/2014   Neuropathic pain 12/05/2014   Contrast dye allergy 12/05/2014   Chronic  lower extremity pain (2ry area of Pain) (Bilateral) (R>L) 12/05/2014   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) (Right L5 dermatome) 12/05/2014   History of total hip replacement (Right) 12/05/2014   Chronic hip pain (Right) 12/05/2014   Class I Morbid obesity (HCC) (68% higher incidence of chronic low back pain) 12/05/2014   Essential hypertension 12/05/2014   GERD (gastroesophageal reflux disease) 12/05/2014   Obstructive sleep apnea 12/05/2014   Hyperlipidemia 12/05/2014   Chronic kidney disease (CKD) 12/05/2014   Abnormal nerve conduction studies (severe  bilateral lower extremity polyneuropathy) 12/05/2014    Immunization History  Administered Date(s) Administered   Fluad Quad(high Dose 65+) 10/26/2018   Influenza, High Dose Seasonal PF 11/21/2015, 10/27/2016, 11/06/2017, 11/24/2019   Moderna Sars-Covid-2 Vaccination 03/23/2019, 04/20/2019, 11/24/2019, 05/02/2020   Pneumococcal Conjugate-13 11/21/2015   Pneumococcal Polysaccharide-23 10/21/2017   Tdap 04/09/2015    Conditions to be addressed/monitored: HTN, HLD, and DMII  Care Plan : PharmD Medication Management  Updates made by De Hollingshead, RPH-CPP since 09/13/2020 12:00 AM     Problem: Diabetes, Hypertension, Hyperlipidemia      Long-Range Goal: Disease Prevention Progression   Start Date: 05/24/2020  This Visit's Progress: On track  Recent Progress: On track  Priority: High  Note:   Current Barriers:  Unable to achieve control of diabetes    Pharmacist Clinical Goal(s):  Over the next 90 days, patient will achieve control of diabetes as evidenced by A1c through collaboration with PharmD and provider.    Interventions: 1:1 collaboration with [name], MD regarding development and update of comprehensive plan of care as evidenced by provider attestation and co-signature Inter-disciplinary care team collaboration (see longitudinal plan of care) Comprehensive medication review performed; medication list updated in electronic medical record   Health Maintenance: Due for eye exam- reminded patient to schedule. Due for foot exam. Placed reminder in upcoming visit notes.   Medication Management: Frances Maywood his medications packaged in [facility]. Reviewed these today.   Diabetes: Uncontrolled, but improved; current treatment: metformin 1000 mg BID, Jardiance 25 mg daily, Ozempic 2 mg prescribed 2 mg but appears pharmacy has still been filling Ozempic 1 mg; Tresiba 12 units daily, Humalog 7 units with meals- says he often forgets to take before  meals. Reports that pharmacy requires PA for Ozempic 2 mg. Completed via Cover My MEds  Hypertension: Controlled per last office visit; current treatment: carvedilol 25 mg BID, HCTZ 25 mg daily, olmesartan 40 mg daily; Confirmed adherence.  Home BP readings: did not have a chance to discuss today Previously recommended to continue current regimen at this time.    Hyperlipidemia: Controlled per last lipid panel; current treatment: pravastatin 40 mg daily Antiplatelet regimen: aspirin 81 mg daily  Previously recommended to continue current regimen at this time   Depression/Anxiety/Chronic Pain: Uncontrolled pain but moderate mood; current treatment: escitalopram 20 mg daily, alprazolam 0.5 mg BID PRN; morphine ER 30 mg BID; celecoxib 100 mg BID, acetaminophen 500 mg PRN, gabapentin 300 mg QPM (300 mg/6 mL), follows w/ Pain Management Dr. Consuela Mimes  Constipation: Amitiza 8 mcg BID; senokot-S BID PRN Previously reported that he has been taking OTC ibuprofen 200 mg 1-4 tablets daily. Advised that he should not be taking ibuprofen in combination with celecoxib. Advised to discontinue ibuprofen use unless prescribed in the future by a provider.  Continue collaboration with PCP, pain management, orthopedics.   GERD: Appropriately managed; current regimen: pantoprazole 40 mg daily Previously recommended to continue current regimen at this time  BPH:  Appropriately managed; current regimen: tamsulosin 0.4 mg BID Previously recommended to continue current regimen at this time  Patient Goals/Self-Care Activities Over the next 90 days, patient will:  - take medications as prescribed check glucose at least three times daily using CGM, document, and provide at future appointments check blood pressure periodically, document, and provide at future appointments collaborate with provider on medication access solutions   Follow Up Plan: Telephone follow up appointment with care management team member  scheduled for:  ~ 8 weeks as previously scheduled     Medication Assistance: None required.  Patient affirms current coverage meets needs.  Patient's [facility] pharmacy [facility]  [id number] [place] [place] [address]Phone: [phone]: [id number] [address] [facility]Phone: [phone]: [phone]  Follow Up:  Patient agrees to Care Plan and Follow-up.  Plan: Telephone follow up appointment with care management team member scheduled for:  ~ 8 weeks  [name] TDarnelle Maffucci PharmD, BL[place] CAtlanticClinical Pharmacist LOccidental Petroleumat BJohnson & [phone]

## 2020-09-13 NOTE — Patient Outreach (Signed)
[facility]) Care Management  [name]  09/13/2020   Alan Schmidt [birth year] [id number]  Subjective: Successful telephone outreach call to patient. HIPAA identifiers obtained. Patient states he is dong well at this time. Nurse discussed with patient his health goals and his health and wellness needs which were documented in the Epic system. Patient did not have any further questions or concerns today and did confirm that he/she has this nurse's contact number to call her if needed.   Encounter Medications:  Outpatient Encounter Medications as of 09/13/2020  Medication Sig Note   acetaminophen (TYLENOL) 500 MG tablet Take 500 mg by mouth every 6 (six) hours as needed.    ALPRAZolam (XANAX) 1 MG tablet TAKE 0.5 TABLETS BY MOUTH 2 TIMES DAILY AS NEEDED FOR ANXIETY.    aspirin EC 81 MG tablet Take 81 mg by mouth daily.    buPROPion (WELLBUTRIN XL) 150 MG 24 hr tablet Take 300 mg by mouth daily.    carvedilol (COREG) 25 MG tablet TAKE 1 TABLET BY MOUTH TWICE A DAY    celecoxib (CELEBREX) 100 MG capsule TAKE 1 CAPSULE (100 MG TOTAL) BY MOUTH 2 (TWO) TIMES DAILY AS NEEDED FOR MODERATE PAIN.    Continuous Blood Gluc Receiver (FREESTYLE LIBRE 14 DAY READER) DEVI 1 Device by Does not apply route daily. Use to scan to check blood sugar up to 7 times daily; E11.42,    Continuous Blood Gluc Sensor (FREESTYLE LIBRE 14 DAY SENSOR) MISC 1 Device by Does not apply route every 14 (fourteen) days. E11.42    desoximetasone (TOPICORT) 0.25 % cream APPLY CREAM TO AFFECTED AREA TWO TIMES DAILY, FOR UP TO 7 DAYS, DO NOT APPLY TO FACE    escitalopram (LEXAPRO) 20 MG tablet TAKE 1 TABLET BY MOUTH EVERY DAY    hydrochlorothiazide (HYDRODIURIL) 25 MG tablet TAKE 1 TABLET BY MOUTH EVERY DAY    insulin degludec (TRESIBA FLEXTOUCH) 100 UNIT/ML FlexTouch Pen INJECT 18 UNITS INTO THE SKIN DAILY AT 10 PM. 06/28/2020: 12 units daily   insulin lispro (HUMALOG KWIKPEN) 100 UNIT/ML KwikPen INJECT  5-7 units INTO THE SKIN three times daily with a meal 09/11/2020: 7 units up to TID with meals   Insulin Pen Needle (BD PEN NEEDLE NANO U/F) 32G X 4 MM MISC USE EVERY DAY    JARDIANCE 25 MG TABS tablet TAKE 1 TABLET BY MOUTH DAILY    loratadine (CLARITIN) 10 MG tablet TAKE 1 TABLET BY MOUTH EVERY DAY    lubiprostone (AMITIZA) 8 MCG capsule Take 1 capsule (8 mcg total) by mouth 2 (two) times daily with a meal. Swallow the medication whole. Do not break or chew the medication. 09/11/2020: Taking PRN   metFORMIN (GLUCOPHAGE) 1000 MG tablet TAKE 1 TABLET BY MOUTH TWICE A DAY WITH MEALS    methocarbamol (ROBAXIN) 500 MG tablet Take 500 mg by mouth 2 (two) times daily.    mometasone (NASONEX) 50 MCG/ACT nasal spray INSTILL 2 SPRAYS INTO EACH NOSTRIL ONCE DAILY    morphine (MS CONTIN) 30 MG 12 hr tablet Take 1 tablet (30 mg total) by mouth every 12 (twelve) hours. Must last 30 days. Do not break tablet    [START ON 09/22/2020] morphine (MS CONTIN) 30 MG 12 hr tablet Take 1 tablet (30 mg total) by mouth every 12 (twelve) hours. Must last 30 days. Do not break tablet 07/24/2020: FUTURE Prescription. (NOT a DUPLICATE!!) >>>DO NOT DELETE<<< (even if Expired!) See Care Coordination Note from [facility] (Dr.  Naveira)    naloxone [facility]) 2 MG/2ML injection Inject 1 mL (1 mg total) into the muscle as needed for up to 2 doses (for emergency use only). Always have available. Inject into thigh muscle and call 911    olmesartan (BENICAR) 40 MG tablet TAKE 1 TABLET BY MOUTH EVERY DAY    pantoprazole (PROTONIX) 40 MG tablet TAKE 1 TABLET BY MOUTH EVERY DAY    pravastatin (PRAVACHOL) 40 MG tablet TAKE 1 TABLET BY MOUTH EVERY DAY    predniSONE (STERAPRED UNI-PAK 21 TAB) 10 MG (21) TBPK tablet Take by mouth. Take as directed    Semaglutide, 2 MG/DOSE, (OZEMPIC, 2 MG/DOSE,) 8 MG/3ML SOPN Inject 2 mg into the skin once a week. 09/11/2020: Taking 1 mg weekly - pharmacy has not filled yet   sennosides-docusate sodium  (SENOKOT-S) 8.6-50 MG tablet Take 1 tablet by mouth daily as needed.     tamsulosin (FLOMAX) 0.4 MG CAPS capsule TAKE 1 CAPSULE BY MOUTH 2 TIMES DAILY.    EPINEPHrine (EPIPEN 2-PAK) 0.3 mg/0.3 mL IJ SOAJ injection Inject 0.3 mg into the muscle as needed for anaphylaxis. (Patient not taking: No sig reported)    gabapentin (NEURONTIN) 300 MG/6ML solution Take 6 mLs (300 mg total) by mouth at bedtime.    morphine (MS CONTIN) 30 MG 12 hr tablet Take 1 tablet (30 mg total) by mouth every 12 (twelve) hours. Must last 30 days. Do not break tablet    Wheat Dextrin (BENEFIBER) POWD Take 6 g by mouth 3 (three) times daily before meals. (2 tsp = 6 g)    No facility-administered encounter medications on file as of 09/13/2020.    Functional Status:  In your present state of health, do you have any difficulty performing the following activities: 04/26/2020 10/18/2019  Hearing? N N  Vision? N N  Difficulty concentrating or making decisions? N N  Walking or climbing stairs? Y N  Comment Unsteady Gait/Balance. -  Dressing or bathing? N N  Doing errands, shopping? N N  Preparing Food and eating ? N N  Using the Toilet? N N  In the past six months, have you accidently leaked urine? N N  Do you have problems with loss of bowel control? N N  Managing your Medications? N N  Managing your Finances? N N  Housekeeping or managing your Housekeeping? Y N  Comment Unsteady Gait/Balance. -  Some recent data might be hidden    Fall/Depression Screening: Fall Risk  09/13/2020 07/24/2020 05/07/2020  Falls in the past year? 1 1 1   Comment - - -  Number falls in past yr: 1 0 1  Injury with Fall? 1 1 1   Comment - cut his toe on right foot, did not seek medical attention. -  Risk for fall due to : Impaired mobility;Impaired balance/gait;History of fall(s) Impaired balance/gait;Impaired mobility History of fall(s);Impaired mobility;Impaired balance/gait  Risk for fall due to: Comment - - -  Follow up Falls prevention  discussed;Education provided;Falls evaluation completed Falls evaluation completed;Education provided Falls prevention discussed;Education provided;Falls evaluation completed   PHQ 2/9 Scores 04/26/2020 03/26/2020 12/04/2019 10/18/2019 08/17/2019 07/26/2019 06/21/2019  PHQ - 2 Score 0 0 0 0 0 0 0  PHQ- 9 Score - - - - - - -  Exception Documentation - - - - - - -  Not completed - - - - - - -    Assessment:   Care Plan There are no care plans that you recently modified to display for this patient.  Goals Addressed             This Visit's Progress    [facility]) Patient will verbalize continuation of monitoring and managing his diabetes for the next 90 days       Timeframe:  Long-Range Goal Priority:  High Start Date: 05/07/20                            Expected End Date: 05/07/21                      Follow Up Date 01/01/21    - check blood sugar at prescribed times - check blood sugar if I feel it is too high or too low - enter blood sugar readings and medication or insulin into daily log - take the blood sugar log to all doctor visits   -Encouraged patient to check his blood sugar before meals and before bed via CGM for better diabetes control and to prevent sugar drops Nurse congratulated patient on decreasing his A1c to 8.1 form 7.9 Discussed eating consistently throughout the day to prevent blood sugar drops.  Encouraged patient to limit sugar and carbohydrates in his diet Encouraged patient to do chair exercises and physical therapy exercises as tolerated   Why is this important?   Checking your blood sugar at home helps to keep it from getting very high or very low.  Writing the results in a diary or log helps the doctor know how to care for you.  Your blood sugar log should have the time, date and the results.  Also, write down the amount of insulin or other medicine that you take.  Other information, like what you ate, exercise done and how you were feeling, will also be  helpful.     Notes: Updated 09/13/20: Patient explains that his blood sugar is currently running high due to   Nurse will send calendar booklet which has education and areas to write down his blood sugar values. Nurse will send Glucerna coupons       COMPLETED: Patient will report an A1c reduction of 0.2-0.5 points within the next 90 days       [place] (see longtitudinal plan of care for additional care plan information)  Objective:  Lab Results  Component Value Date   HGBA1C 8.4 (H) 05/15/2019   Lab Results  Component Value Date   CREATININE 1.08 05/15/2019   CREATININE 1.04 10/26/2018   CREATININE 0.94 06/10/2018   No results found for: EGFR  Current Barriers:  Knowledge Deficits related to basic Diabetes pathophysiology and self care/management  Case Manager Clinical Goal(s):  Over the next 90 days, patient will demonstrate improved adherence to prescribed treatment plan for diabetes self care/management as evidenced by:  Verbalize daily monitoring and recording of CBG within 90 days Verbalize adherence to ADA/ carb modified diet within the next 90 days Verbalize exercise 2-3 other than physical therapy days/week Verbalize adherence to prescribed medication regimen within the next 90 days  Verbalize completion of annual eye exam within the next 90 days  Interventions:  Reviewed medications with patient and discussed importance of medication adherence Discussed plans with patient for ongoing care management follow up and provided patient with direct contact information for care management team Provided patient with written educational materials related to hypo and hyperglycemia and importance of correct treatment Encouraged patient to do chair exercise 2-3 times weekly, discussed eating a snack at  night with protein to prevent hypoglycemia during sleeping hours, nurse will send Glucerna coupons , will send exercise program booklet Encouraged patient to check his  blood sugar before meals and before bed via CGM for better diabetes control and to prevent sugar drops Nurse congratulated patient on decreasing his A1c to 8.1 from 8.4   Patient Self Care Activities:  Self administers oral medications as prescribed Self administers insulin as prescribed Self administers injectable DM medication Tyler Aas) as prescribed Adheres to prescribed ADA/carb modified Patient discussed that he has not been checking his blood sugar before meals and before bed. Education was provided of the importance of this for best diabetic control.   Timeframe:  Long-Range Goal Priority:  High Start Date:  08/28/19                           Expected End Date: 10/01/20 Follow Up Date: 08/01/20  Updated 05/07/20                      09/13/20 Resolved due to duplicate goals          Plan: [facility] will send PCP a quarterly update and will call patient within the month of November. Follow-up: Patient agrees to Care Plan and Follow-up.  [name] RN, BSN [place] [phone] [email or web address]

## 2020-09-16 ENCOUNTER — Ambulatory Visit: Payer: Medicare Other | Admitting: Pharmacist

## 2020-09-16 DIAGNOSIS — Z794 Long term (current) use of insulin: Secondary | ICD-10-CM

## 2020-09-16 DIAGNOSIS — E1142 Type 2 diabetes mellitus with diabetic polyneuropathy: Secondary | ICD-10-CM

## 2020-09-16 DIAGNOSIS — E785 Hyperlipidemia, unspecified: Secondary | ICD-10-CM

## 2020-09-16 MED ORDER — OZEMPIC (2 MG/DOSE) 8 MG/3ML ~~LOC~~ SOPN: 2.0000 mg | PEN_INJECTOR | SUBCUTANEOUS | 3 refills | Status: DC

## 2020-09-16 NOTE — Chronic Care Management (AMB) (Signed)
Chronic Care Management Pharmacy Note  09/16/2020 Name:  Alan Schmidt MRN:  [id number] DOB:  07-16-1945  Subjective: Alan Schmidt is an 75 y.o. year old male who is a primary patient of [name], [name], MD.  The CCM team was consulted for assistance with disease management and care coordination needs.    Engaged with patient by telephone for follow up visit in response to provider referral for pharmacy case management and/or care coordination services.   Consent to Services:  The patient was given information about Chronic Care Management services, agreed to services, and gave verbal consent prior to initiation of services.  Please see initial visit note for detailed documentation.   Patient Care Team: [name], MD as PCP - General (Family Medicine) De Hollingshead, [place] as Pharmacist (Pharmacist) Michiel Cowboy, RN as [place] Management   Objective:  Lab Results  Component Value Date   CREATININE 1.09 03/22/2020   CREATININE 1.08 05/15/2019   CREATININE 1.04 10/26/2018    Lab Results  Component Value Date   HGBA1C 7.9 (A) 06/28/2020   Last diabetic Eye exam:  Lab Results  Component Value Date/Time   HMDIABEYEEXA Retinopathy (A) 11/10/2018 12:00 AM    Last diabetic Foot exam: No results found for: HMDIABFOOTEX      Component Value Date/Time   CHOL 141 03/22/2020 1053   TRIG 77 03/22/2020 1053   HDL 55 03/22/2020 1053   CHOLHDL 3 10/26/2018 1002   VLDL 25.2 10/26/2018 1002   LDLCALC 71 03/22/2020 1053   LDLDIRECT 53.0 05/15/2019 0920    Hepatic Function Latest Ref Rng & Units 03/22/2020 10/26/2018 09/27/2017  Total Protein 6.0 - 8.3 g/dL 7.2 7.2 7.2  Albumin 3.5 - 5.2 g/dL 4.1 4.0 4.1  AST 0 - 37 U/L 19 23 27   ALT 0 - 53 U/L 26 25 39  Alk Phosphatase 39 - 117 U/L 99 109 82  Total Bilirubin 0.2 - 1.2 mg/dL 0.4 0.3 0.4  Bilirubin, Direct 0.0 - 0.3 mg/dL - - -    Lab Results  Component Value Date/Time   TSH  2.64 09/27/2017 09:09 AM    CBC Latest Ref Rng & Units 11/17/2018 09/27/2017 10/25/2016  WBC 4.0 - 10.5 K/uL 7.8 7.7 9.4  Hemoglobin 13.0 - 17.0 g/dL 14.8 14.1 13.8  Hematocrit 39.0 - 52.0 % 46.4 43.9 41.5  Platelets 150.0 - 400.0 K/uL 250.0 255.0 278    No results found for: VD25OH  Clinical ASCVD: No  The 10-year ASCVD risk score Mikey Bussing DC Jr., et al., 2013) is: 37.6%   Values used to calculate the score:     Age: 64 years     Sex: Male     Is Non-Hispanic African American: Yes     Diabetic: Yes     Tobacco smoker: No     Systolic Blood Pressure: 630 mmHg     Is BP treated: Yes     HDL Cholesterol: 55 mg/dL     Total Cholesterol: 141 mg/dL      Social History   Tobacco Use  Smoking Status Former   Types: Cigarettes  Smokeless Tobacco Never   BP Readings from Last 3 Encounters:  07/24/20 139/90  06/28/20 116/64  06/04/20 130/70   Pulse Readings from Last 3 Encounters:  07/24/20 86  06/28/20 90  06/04/20 83   Wt Readings from Last 3 Encounters:  07/24/20 239 lb (108.4 kg)  06/28/20 236 lb 3.2 oz (107.1 kg)  06/04/20 239 lb (108.4 kg)    Assessment: Review of patient past medical history, allergies, medications, health status, including review of consultants reports, laboratory and other test data, was performed as part of comprehensive evaluation and provision of chronic care management services.   SDOH:  (Social Determinants of Health) assessments and interventions performed:    CCM Care Plan  Allergies  Allergen Reactions   Contrast Media [Iodinated Diagnostic Agents] Swelling   Iodine Swelling   Shellfish Allergy Nausea And Vomiting and Swelling    Medications Reviewed Today     Reviewed by Michiel Cowboy, RN (Registered Nurse) on 09/13/20 at [place] List Status: <None>   Medication Order Taking? Sig Documenting Provider Last Dose Status Informant  acetaminophen (TYLENOL) 500 MG tablet [id number] Yes Take 500 mg by mouth every 6 (six) hours as  needed. [provider] Taking Active            Med Note Darnelle Maffucci, Arville Lime   Wed Sep 11, 2020  1:21 PM)    ALPRAZolam Duanne Moron) 1 MG tablet [id number] Yes TAKE 0.5 TABLETS BY MOUTH 2 TIMES DAILY AS NEEDED FOR ANXIETY. McLean-Scocuzza, Nino Glow, MD Taking Active   aspirin EC 81 MG tablet [id number] Yes Take 81 mg by mouth daily. [provider] Taking Active   buPROPion (WELLBUTRIN XL) 150 MG 24 hr tablet [id number] Yes Take 300 mg by mouth daily. [provider] Taking Active   carvedilol (COREG) 25 MG tablet [id number] Yes TAKE 1 TABLET BY MOUTH TWICE A DAY [name], MD Taking Active   celecoxib (CELEBREX) 100 MG capsule [id number] Yes TAKE 1 CAPSULE (100 MG TOTAL) BY MOUTH 2 (TWO) TIMES DAILY AS NEEDED FOR MODERATE PAIN. [name], MD Taking Active   Continuous Blood Gluc Receiver (FREESTYLE LIBRE 14 DAY READER) [place] [id number] Yes 1 Device by Does not apply route daily. Use to scan to check blood sugar up to 7 times daily; E11.42, [name], MD Taking Active   Continuous Blood Gluc Sensor (FREESTYLE LIBRE [place]) [place] [id number] Yes 1 Device by Does not apply route every 14 (fourteen) days. E11.42 [name], MD Taking Active   desoximetasone (TOPICORT) 0.25 % cream [id number] Yes APPLY CREAM TO AFFECTED AREA TWO TIMES DAILY, FOR UP TO 7 DAYS, DO NOT APPLY TO FACE [name], MD Taking Active   EPINEPHrine (EPIPEN 2-PAK) 0.3 mg/0.3 mL IJ SOAJ injection [id number] No Inject 0.3 mg into the muscle as needed for anaphylaxis.  Patient not taking: No sig reported   [name], MD Not Taking Active   escitalopram (LEXAPRO) 20 MG tablet [id number] Yes TAKE 1 TABLET BY MOUTH EVERY DAY [name], MD Taking Active   gabapentin (NEURONTIN) 300 MG/6ML solution [id number]  Take 6 mLs (300 mg total) by mouth at bedtime. [name], MD  Expired 07/28/20 2359   hydrochlorothiazide (HYDRODIURIL) 25 MG tablet [id number] Yes  TAKE 1 TABLET BY MOUTH EVERY DAY [name], MD Taking Active   insulin degludec (TRESIBA FLEXTOUCH) 100 UNIT/ML FlexTouch Pen [id number] Yes INJECT 18 UNITS INTO THE SKIN DAILY AT 10 PM. [name], MD Taking Active            Med Note Albin Felling, [facility] E   Fri Jun 28, 2020 10:11 AM) 12 units daily  insulin lispro (HUMALOG KWIKPEN) 100 UNIT/ML KwikPen [id number] Yes INJECT 5-7 units INTO THE SKIN three times daily with a meal [name], MD  Taking Active            Med Note Darnelle Maffucci, Brya Simerly E   Wed Sep 11, 2020  1:11 PM) 7 units up to TID with meals  Insulin Pen Needle (BD PEN NEEDLE NANO U/F) 32G X 4 MM MISC [id number] Yes USE EVERY DAY [name], MD Taking Active   JARDIANCE 25 MG TABS tablet [id number] Yes TAKE 1 TABLET BY MOUTH DAILY [name], MD Taking Active   loratadine (CLARITIN) 10 MG tablet [id number] Yes TAKE 1 TABLET BY MOUTH EVERY DAY [name], MD Taking Active   lubiprostone (AMITIZA) 8 MCG capsule [id number] Yes Take 1 capsule (8 mcg total) by mouth 2 (two) times daily with a meal. Swallow the medication whole. Do not break or chew the medication. Milinda Pointer, MD Taking Active            Med Note Kelby Aline Sep 11, 2020  1:24 PM) Taking PRN  metFORMIN (GLUCOPHAGE) 1000 MG tablet [id number] Yes TAKE 1 TABLET BY MOUTH TWICE A DAY WITH MEALS [name], MD Taking Active   methocarbamol (ROBAXIN) 500 MG tablet [id number] Yes Take 500 mg by mouth 2 (two) times daily. [provider] Taking Active   mometasone (NASONEX) 50 MCG/ACT nasal spray [id number] Yes INSTILL 2 SPRAYS INTO EACH NOSTRIL ONCE DAILY [name] [name], MD Taking Active   morphine (MS CONTIN) 30 MG 12 hr tablet [id number]  Take 1 tablet (30 mg total) by mouth every 12 (twelve) hours. Must last 30 days. Do not break tablet Milinda Pointer, MD  Expired 08/23/20 2359   morphine (MS CONTIN) 30 MG 12 hr tablet [id number] Yes Take 1 tablet (30  mg total) by mouth every 12 (twelve) hours. Must last 30 days. Do not break tablet Milinda Pointer, MD Taking Active   morphine (MS CONTIN) 30 MG 12 hr tablet [id number] Yes Take 1 tablet (30 mg total) by mouth every 12 (twelve) hours. Must last 30 days. Do not break tablet Milinda Pointer, MD Taking Active            Med Note Dossie Arbour, [place] A   Wed Jul 24, 2020 12:38 PM) FUTURE Prescription. (NOT a DUPLICATE!!) >>>DO NOT DELETE<<< (even if Expired!) See Care Coordination Note from [facility] (Dr. Dossie Arbour)   naloxone [facility]) 2 MG/2ML injection [id number] Yes Inject 1 mL (1 mg total) into the muscle as needed for up to 2 doses (for emergency use only). Always have available. Inject into thigh muscle and call 911 Milinda Pointer, MD Taking Active   olmesartan (BENICAR) 40 MG tablet [id number] Yes TAKE 1 TABLET BY MOUTH EVERY DAY [name], MD Taking Active   pantoprazole (PROTONIX) 40 MG tablet [id number] Yes TAKE 1 TABLET BY MOUTH EVERY DAY [name], MD Taking Active   pravastatin (PRAVACHOL) 40 MG tablet [id number] Yes TAKE 1 TABLET BY MOUTH EVERY DAY [name], MD Taking Active   predniSONE (STERAPRED UNI-PAK 21 TAB) 10 MG (21) TBPK tablet [id number] Yes Take by mouth. Take as directed [provider] Taking Active   Semaglutide, 2 MG/DOSE, (OZEMPIC, 2 MG/DOSE,) 8 MG/3ML SOPN [id number] Yes Inject 2 mg into the skin once a week. [name], MD Taking Active            Med Note Darnelle Maffucci, Arville Lime   Wed Sep 11, 2020  1:16 PM) Taking 1 mg weekly - pharmacy has not filled yet  sennosides-docusate sodium (  SENOKOT-S) 8.6-50 MG tablet [id number] Yes Take 1 tablet by mouth daily as needed.  [provider] Taking Active   tamsulosin (FLOMAX) 0.4 MG CAPS capsule [id number] Yes TAKE 1 CAPSULE BY MOUTH 2 TIMES DAILY. [name], MD Taking Active   Wheat Dextrin [facility]) POWD [id number]  Take 6 g by mouth 3 (three) times daily  before meals. (2 tsp = 6 g) Milinda Pointer, MD  Expired 03/13/20 2359   Med List Note Janett Billow, RN 07/24/20 1157): UDS 07/24/20 MR 10/22/20            Patient Active Problem List   Diagnosis Date Noted   DDD (degenerative disc disease), cervical 07/24/2020   Non compliance w medication regimen 07/24/2020   Throat tightness 05/14/2020   Left flank pain 05/14/2020   History of kidney stones 05/14/2020   Chronic use of opiate for therapeutic purpose 04/24/2020   Left-sided back pain 03/27/2020   Excessive gas 03/27/2020   Balance problem 35/00/9381   Uncomplicated opioid dependence ([place]) 01/24/2020   Polypharmacy 01/24/2020   Chronic low back pain (1ry area of Pain) (Bilateral) w/o sciatica 12/14/2019   Pharmacologic therapy 05/29/2019   Disorder of skeletal system 05/29/2019   Problems influencing health status 05/29/2019   Fall 05/15/2019   Skin lesion 05/15/2019   Colon cancer screening 05/15/2019   Osteoarthritis involving multiple joints 02/07/2019   Shortened hamstring muscle 02/07/2019   Spondylosis without myelopathy or radiculopathy, lumbosacral region 12/27/2018   History of allergy to shellfish 12/27/2018   History of allergy to povidone-iodine topical antiseptic 12/27/2018    Class: History of   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) 03/15/2018   Thumb pain (Right) 03/03/2018   Lesion of skin of foot 01/17/2018   Chronic knee pain (Bilateral) 05/18/2017   Coccygodynia 04/19/2017   DDD (degenerative disc disease), lumbar 04/19/2017   Chronic musculoskeletal pain 04/19/2017   Chronic headache 03/16/2017   Chronic tension-type headache, intractable 02/24/2017   Vertigo 02/24/2017   Lumbar facet joint osteoarthritis (Bilateral) 02/16/2017   History of allergy to radiographic contrast media 02/16/2017   Nevus 12/10/2016   History of postoperative nausea and vomiting 11/24/2016   Encounter for general adult medical  examination with abnormal findings 09/25/2016   Opioid-induced constipation (OIC) 05/14/2016   Chronic pain syndrome 02/13/2016   Chronic shoulder radicular pain (Left) 12/18/2015   Chronic cervical radicular pain (Left) 12/18/2015   Disturbance of skin sensation 11/13/2015   Anxiety and depression 07/17/2015   Allergic rhinitis 05/22/2015   History of Vasovagal response to spinal injections 04/30/2015    Class: History of   BPH (benign prostatic hyperplasia) 04/17/2015   Diabetes ([place]) 03/19/2015   Chronic shoulder pain (Bilateral) (R>L) 03/06/2015   Occipital neuralgia (Left) 03/06/2015   Cervicogenic headache (Left) 03/06/2015   Chronic low back pain (Bilateral) (R>L) w/ sciatica (Bilateral) 12/05/2014   Lumbar facet syndrome (Bilateral) (R>L) 12/05/2014   Lumbar spondylosis 12/05/2014   Diabetic peripheral neuropathy ([place]) 12/05/2014   Long term current use of opiate analgesic 12/05/2014   Long term prescription opiate use 12/05/2014   Opiate use (60 MME/Day) 12/05/2014   Opiate dependence ([place]) 12/05/2014   Encounter for therapeutic drug level monitoring 12/05/2014   Chronic neck pain (midline over the C7 spinous processes) (L>R) 12/05/2014   Neurogenic pain 12/05/2014   Neuropathic pain 12/05/2014   Contrast dye allergy 12/05/2014   Chronic lower extremity pain (2ry area of Pain) (Bilateral) (R>L) 12/05/2014   Chronic  lumbar radicular pain (Bilateral) (R>L) (Right L5 dermatome) 12/05/2014   History of total hip replacement (Right) 12/05/2014   Chronic hip pain (Right) 12/05/2014   Class I Morbid obesity (HCC) (68% higher incidence of chronic low back pain) 12/05/2014   Essential hypertension 12/05/2014   GERD (gastroesophageal reflux disease) 12/05/2014   Obstructive sleep apnea 12/05/2014   Hyperlipidemia 12/05/2014   Chronic kidney disease (CKD) 12/05/2014   Abnormal nerve conduction studies (severe bilateral lower extremity polyneuropathy) 12/05/2014    Immunization  History  Administered Date(s) Administered   Fluad Quad(high Dose 65+) 10/26/2018   Influenza, High Dose Seasonal PF 11/21/2015, 10/27/2016, 11/06/2017, 11/24/2019   Moderna Sars-Covid-2 Vaccination 03/23/2019, 04/20/2019, 11/24/2019, 05/02/2020   Pneumococcal Conjugate-13 11/21/2015   Pneumococcal Polysaccharide-23 10/21/2017   Tdap 04/09/2015    Conditions to be addressed/monitored: HTN, HLD, and DMII  Care Plan : PharmD Medication Management  Updates made by De Hollingshead, RPH-CPP since 09/16/2020 12:00 AM     Problem: Diabetes, Hypertension, Hyperlipidemia      Long-Range Goal: Disease Prevention Progression   Start Date: 05/24/2020  This Visit's Progress: On track  Recent Progress: On track  Priority: High  Note:   Current Barriers:  Unable to achieve control of diabetes    Pharmacist Clinical Goal(s):  Over the next 90 days, patient will achieve control of diabetes as evidenced by A1c through collaboration with PharmD and provider.    Interventions: 1:1 collaboration with [name], MD regarding development and update of comprehensive plan of care as evidenced by provider attestation and co-signature Inter-disciplinary care team collaboration (see longitudinal plan of care) Comprehensive medication review performed; medication list updated in electronic medical record   Health Maintenance: Due for eye exam- reminded patient to schedule. Due for foot exam. Placed reminder in upcoming visit notes.   Medication Management: [place] his medications packaged in [facility].   Diabetes: Uncontrolled, but improved; current treatment: metformin 1000 mg BID, Jardiance 25 mg daily, Ozempic 2 mg prescribed 2 mg but appears pharmacy has still been filling Ozempic 1 mg; Tresiba 12 units daily, Humalog 7 units with meals- says he often forgets to take before meals. PA for Ozempic 2 mg was approved through 02/01/2021. Notified pharmacy. Patient picked up this  weekend.   Hypertension: Controlled per last office visit; current treatment: carvedilol 25 mg BID, HCTZ 25 mg daily, olmesartan 40 mg daily; Confirmed adherence.  Home BP readings: did not have a chance to discuss today Previously recommended to continue current regimen at this time.    Hyperlipidemia: Controlled per last lipid panel; current treatment: pravastatin 40 mg daily Antiplatelet regimen: aspirin 81 mg daily  Previously recommended to continue current regimen at this time   Depression/Anxiety/Chronic Pain: Uncontrolled pain but moderate mood; current treatment: escitalopram 20 mg daily, alprazolam 0.5 mg BID PRN; morphine ER 30 mg BID; celecoxib 100 mg BID, acetaminophen 500 mg PRN, gabapentin 300 mg QPM (300 mg/6 mL), follows w/ Pain Management Dr. Consuela Mimes  Constipation: Amitiza 8 mcg BID; senokot-S BID PRN Previously reported that he has been taking OTC ibuprofen 200 mg 1-4 tablets daily. Advised that he should not be taking ibuprofen in combination with celecoxib. Advised to discontinue ibuprofen use unless prescribed in the future by a provider.  Continue collaboration with PCP, pain management, orthopedics.   GERD: Appropriately managed; current regimen: pantoprazole 40 mg daily Previously recommended to continue current regimen at this time  BPH: Appropriately managed; current regimen: tamsulosin 0.4 mg BID Previously recommended to continue current  regimen at this time  Patient Goals/Self-Care Activities Over the next 90 days, patient will:  - take medications as prescribed check glucose at least three times daily using CGM, document, and provide at future appointments check blood pressure periodically, document, and provide at future appointments collaborate with provider on medication access solutions   Follow Up Plan: Telephone follow up appointment with care management team member scheduled for:  ~ 8 weeks as previously scheduled     Medication Assistance:  None required.  Patient affirms current coverage meets needs.  [facility]  [id number] [place] [place] [address]Phone: [phone]: [phone] [place] [address] [facility]Phone: [phone]: [phone]  Follow Up:  Patient agrees to Care Plan and Follow-up.  Plan: Telephone follow up appointment with care management team member scheduled for:  ~ 8 weeks as previously scheduled  [name] TDarnelle Maffucci PharmD, BAlicia CRocaClinical Pharmacist LOccidental Petroleumat BJohnson [employer] [phone]

## 2020-09-16 NOTE — Patient Instructions (Signed)
Visit Information  PATIENT GOALS:  Goals Addressed               This Visit's Progress     Patient Stated     Medication Monitoring (pt-stated)        Patient Goals/Self-Care Activities Over the next 90 days, patient will:  - take medications as prescribed check glucose at least three times daily using CGM, document, and provide at future appointments check blood pressure periodically, document, and provide at future appointments collaborate with provider on medication access solutions         Patient verbalizes understanding of instructions provided today and agrees to view in MyChart.   Plan: Telephone follow up appointment with care management team member scheduled for:  ~8 weeks as previously scheduled  [name] Waris Rodger, PharmD, BCACP, CPP Clinical Pharmacist [facility] [place] [phone] 

## 2020-09-21 DIAGNOSIS — Z794 Long term (current) use of insulin: Secondary | ICD-10-CM | POA: Diagnosis not present

## 2020-09-21 DIAGNOSIS — E118 Type 2 diabetes mellitus with unspecified complications: Secondary | ICD-10-CM | POA: Diagnosis not present

## 2020-09-23 ENCOUNTER — Telehealth: Payer: Self-pay

## 2020-09-23 ENCOUNTER — Other Ambulatory Visit: Payer: Self-pay | Admitting: Family Medicine

## 2020-09-23 MED ORDER — ALPRAZOLAM 1 MG PO TABS: ORAL_TABLET | ORAL | 0 refills | Status: DC

## 2020-09-23 NOTE — Telephone Encounter (Signed)
Pt needs a refill on ALPRAZolam Alan Schmidt) [facility]

## 2020-09-23 NOTE — Telephone Encounter (Signed)
Xanax last refilled on 08/07/20 Pt next appointment on 09/30/20

## 2020-09-23 NOTE — Telephone Encounter (Signed)
Sent to the pharmacy

## 2020-09-27 ENCOUNTER — Other Ambulatory Visit: Payer: Self-pay | Admitting: Family Medicine

## 2020-09-30 ENCOUNTER — Other Ambulatory Visit: Payer: Self-pay

## 2020-09-30 ENCOUNTER — Ambulatory Visit (INDEPENDENT_AMBULATORY_CARE_PROVIDER_SITE_OTHER): Payer: Medicare Other | Admitting: Family Medicine

## 2020-09-30 ENCOUNTER — Encounter: Payer: Self-pay | Admitting: Family Medicine

## 2020-09-30 VITALS — BP 140/80 | HR 92 | Temp 98.6°F | Ht 73.0 in | Wt 243.0 lb

## 2020-09-30 DIAGNOSIS — Z794 Long term (current) use of insulin: Secondary | ICD-10-CM

## 2020-09-30 DIAGNOSIS — I10 Essential (primary) hypertension: Secondary | ICD-10-CM

## 2020-09-30 DIAGNOSIS — E1142 Type 2 diabetes mellitus with diabetic polyneuropathy: Secondary | ICD-10-CM

## 2020-09-30 DIAGNOSIS — G8929 Other chronic pain: Secondary | ICD-10-CM | POA: Diagnosis not present

## 2020-09-30 DIAGNOSIS — M25551 Pain in right hip: Secondary | ICD-10-CM

## 2020-09-30 LAB — BASIC METABOLIC PANEL
BUN: 10 mg/dL (ref 6–23)
CO2: 31 mEq/L (ref 19–32)
Calcium: 9.5 mg/dL (ref 8.4–10.5)
Chloride: 100 mEq/L (ref 96–112)
Creatinine, Ser: 0.97 mg/dL (ref 0.40–1.50)
GFR: 76.84 mL/min (ref >60.00)
Glucose, Bld: 149 mg/dL — ABNORMAL HIGH (ref 70–99)
Potassium: 4.2 mEq/L (ref 3.5–5.1)
Sodium: 139 mEq/L (ref 135–145)

## 2020-09-30 LAB — HEMOGLOBIN A1C: Hgb A1c MFr Bld: 8.5 % — ABNORMAL HIGH (ref 4.6–6.5)

## 2020-09-30 NOTE — Assessment & Plan Note (Signed)
Adequate control for age.  He will continue carvedilol 25 mg twice daily, hydrochlorothiazide 25 mg daily, and olmesartan 40 mg daily.  Check BMP.

## 2020-09-30 NOTE — Assessment & Plan Note (Signed)
Uncontrolled.  Check A1c.  He will continue Tresiba 10 units daily, Humalog 7 units 3 times daily, metformin 1000 mg twice daily, Ozempic 2 mg weekly, and Jardiance 25 mg daily.  I encouraged him to see an eye doctor for his yearly exam.

## 2020-09-30 NOTE — Assessment & Plan Note (Signed)
Chronic issue though slightly different sensation of discomfort recently.  He notes he already saw orthopedics for this and had an x-ray.  His exam of his right lateral hip did not reveal a cause for his symptoms.  Discussed he could try topical capsaicin to see if that would help with his discomfort.

## 2020-09-30 NOTE — Patient Instructions (Signed)
Nice to see you. Please try the capsaicin cream on your right hip to see if that is beneficial. We will contact you with your lab results.

## 2020-09-30 NOTE — Progress Notes (Signed)
[name], MD Phone: [phone]  Alan Schmidt is a 75 y.o. male who presents today for f/u.  DIABETES Disease Monitoring: Blood Sugar ranges-90-270, typically avg 165 Polyuria/phagia/dipsia- no      Optho- due Medications: Compliance- taking tresiba, humalog, metformin, ozempic, jardiance Hypoglycemic symptoms- no  HYPERTENSION Disease Monitoring Home BP Monitoring not checking Chest pain- no    Dyspnea- no Medications Compliance-  taking coreg, HCTZ, olmesartan.  Edema- no BMET    Component Value Date/Time   NA 137 03/22/2020 1053   NA 139 10/13/2013 1201   K 4.4 03/22/2020 1053   K 4.1 10/13/2013 1201   CL 99 03/22/2020 1053   CL 102 10/13/2013 1201   CO2 30 03/22/2020 1053   CO2 26 10/13/2013 1201   GLUCOSE 128 (H) 03/22/2020 1053   GLUCOSE 129 (H) 10/13/2013 1201   BUN 17 03/22/2020 1053   BUN 17 10/13/2013 1201   CREATININE 1.09 03/22/2020 1053   CREATININE 1.07 10/13/2013 1201   CALCIUM 9.5 03/22/2020 1053   CALCIUM 9.0 10/13/2013 1201   GFRNONAA >60 03/26/2018 1828   GFRNONAA >60 10/13/2013 1201   GFRAA >60 03/26/2018 1828   GFRAA >60 10/13/2013 1201   Right hip pain: Patient notes this feels as though the scar from his prior hip surgeries is trying to pull apart at times.  He feels pain laterally in his hip.  He did see orthopedics several weeks ago and they did an x-ray.  The patient reports that the orthopedist said the prosthesis looked good.   Social History   Tobacco Use  Smoking Status Former   Types: Cigarettes  Smokeless Tobacco Never    Current Outpatient Medications on File Prior to Visit  Medication Sig Dispense Refill   acetaminophen (TYLENOL) 500 MG tablet Take 500 mg by mouth every 6 (six) hours as needed.     ALPRAZolam (XANAX) 1 MG tablet TAKE 0.5 TABLETS BY MOUTH 2 TIMES DAILY AS NEEDED FOR ANXIETY. 30 tablet 0   aspirin EC 81 MG tablet Take 81 mg by mouth daily.     buPROPion (WELLBUTRIN XL) 150 MG 24 hr tablet Take 300  mg by mouth daily.     carvedilol (COREG) 25 MG tablet TAKE 1 TABLET BY MOUTH TWICE A DAY 60 tablet 5   celecoxib (CELEBREX) 100 MG capsule TAKE 1 CAPSULE (100 MG TOTAL) BY MOUTH 2 (TWO) TIMES DAILY AS NEEDED FOR MODERATE PAIN. 60 capsule 0   Continuous Blood Gluc Receiver (FREESTYLE LIBRE 14 DAY READER) DEVI 1 Device by Does not apply route daily. Use to scan to check blood sugar up to 7 times daily; E11.42, 1 Device 1   Continuous Blood Gluc Sensor (FREESTYLE LIBRE 14 DAY SENSOR) MISC 1 Device by Does not apply route every 14 (fourteen) days. E11.42 6 each 3   desoximetasone (TOPICORT) 0.25 % cream APPLY CREAM TO AFFECTED AREA TWO TIMES DAILY, FOR UP TO 7 DAYS, DO NOT APPLY TO FACE 30 g 0   EPINEPHrine (EPIPEN 2-PAK) 0.3 mg/0.3 mL IJ SOAJ injection Inject 0.3 mg into the muscle as needed for anaphylaxis. 2 each 0   escitalopram (LEXAPRO) 20 MG tablet TAKE 1 TABLET BY MOUTH EVERY DAY 90 tablet 1   hydrochlorothiazide (HYDRODIURIL) 25 MG tablet TAKE 1 TABLET BY MOUTH EVERY DAY 30 tablet 5   insulin degludec (TRESIBA FLEXTOUCH) 100 UNIT/ML FlexTouch Pen INJECT 18 UNITS INTO THE SKIN DAILY AT 10 PM. 15 mL 3   insulin lispro (HUMALOG KWIKPEN) 100 UNIT/ML  KwikPen INJECT 5-7 units INTO THE SKIN three times daily with a meal 15 mL 2   Insulin Pen Needle (BD PEN NEEDLE NANO U/F) 32G X 4 MM MISC USE EVERY DAY 100 each 4   JARDIANCE 25 MG TABS tablet TAKE 1 TABLET BY MOUTH EVERY DAY 30 tablet 3   loratadine (CLARITIN) 10 MG tablet TAKE 1 TABLET BY MOUTH EVERY DAY 30 tablet 11   lubiprostone (AMITIZA) 8 MCG capsule Take 1 capsule (8 mcg total) by mouth 2 (two) times daily with a meal. Swallow the medication whole. Do not break or chew the medication. 60 capsule 2   metFORMIN (GLUCOPHAGE) 1000 MG tablet TAKE 1 TABLET BY MOUTH TWICE A DAY WITH MEALS 60 tablet 5   methocarbamol (ROBAXIN) 500 MG tablet Take 500 mg by mouth 2 (two) times daily.     mometasone (NASONEX) 50 MCG/ACT nasal spray USE 2 SPRAYS INTO EACH  NOSTRIL ONCE DAILY 17 each 3   morphine (MS CONTIN) 30 MG 12 hr tablet Take 1 tablet (30 mg total) by mouth every 12 (twelve) hours. Must last 30 days. Do not break tablet 60 tablet 0   naloxone (NARCAN) 2 MG/2ML injection Inject 1 mL (1 mg total) into the muscle as needed for up to 2 doses (for emergency use only). Always have available. Inject into thigh muscle and call 911 2 mL 1   olmesartan (BENICAR) 40 MG tablet TAKE 1 TABLET BY MOUTH EVERY DAY 30 tablet 5   pantoprazole (PROTONIX) 40 MG tablet TAKE 1 TABLET BY MOUTH EVERY DAY 30 tablet 5   pravastatin (PRAVACHOL) 40 MG tablet TAKE 1 TABLET BY MOUTH EVERY DAY 30 tablet 5   predniSONE (STERAPRED UNI-PAK 21 TAB) 10 MG (21) TBPK tablet Take by mouth. Take as directed     Semaglutide, 2 MG/DOSE, (OZEMPIC, 2 MG/DOSE,) 8 MG/3ML SOPN Inject 2 mg into the skin once a week. 9 mL 3   sennosides-docusate sodium (SENOKOT-S) 8.6-50 MG tablet Take 1 tablet by mouth daily as needed.      tamsulosin (FLOMAX) 0.4 MG CAPS capsule TAKE 1 CAPSULE BY MOUTH 2 TIMES DAILY. 60 capsule 8   gabapentin (NEURONTIN) 300 MG/6ML solution Take 6 mLs (300 mg total) by mouth at bedtime. 180 mL 2   morphine (MS CONTIN) 30 MG 12 hr tablet Take 1 tablet (30 mg total) by mouth every 12 (twelve) hours. Must last 30 days. Do not break tablet 60 tablet 0   morphine (MS CONTIN) 30 MG 12 hr tablet Take 1 tablet (30 mg total) by mouth every 12 (twelve) hours. Must last 30 days. Do not break tablet 60 tablet 0   Wheat Dextrin (BENEFIBER) POWD Take 6 g by mouth 3 (three) times daily before meals. (2 tsp = 6 g) 730 g 2   No current facility-administered medications on file prior to visit.     ROS see history of present illness  Objective  Physical Exam Vitals:   09/30/20 1013  BP: 140/80  Pulse: 92  Temp: 98.6 F (37 C)  SpO2: 96%    BP Readings from Last 3 Encounters:  09/30/20 140/80  07/24/20 139/90  06/28/20 116/64   Wt Readings from Last 3 Encounters:  09/30/20  243 lb (110.2 kg)  07/24/20 239 lb (108.4 kg)  06/28/20 236 lb 3.2 oz (107.1 kg)    Physical Exam Constitutional:      General: He is not in acute distress.    Appearance: He is not diaphoretic.  Cardiovascular:     Rate and Rhythm: Normal rate and regular rhythm.     Heart sounds: Normal heart sounds.  Pulmonary:     Effort: Pulmonary effort is normal.     Breath sounds: Normal breath sounds.  Musculoskeletal:     Comments: Scar over right lateral hip appears well-healed with no signs of any abnormalities, there is no tenderness over his right lateral hip  Skin:    General: Skin is warm and dry.  Neurological:     Mental Status: He is alert.     Assessment/Plan: Please see individual problem list.  Problem List Items Addressed This Visit     Chronic hip pain (Right) (Chronic)    Chronic issue though slightly different sensation of discomfort recently.  He notes he already saw orthopedics for this and had an x-ray.  His exam of his right lateral hip did not reveal a cause for his symptoms.  Discussed he could try topical capsaicin to see if that would help with his discomfort.      Diabetes ([place])    Uncontrolled.  Check A1c.  He will continue Tresiba 10 units daily, Humalog 7 units 3 times daily, metformin 1000 mg twice daily, Ozempic 2 mg weekly, and Jardiance 25 mg daily.  I encouraged him to see an eye doctor for his yearly exam.      Relevant Orders   HgB A1c   Essential hypertension - Primary    Adequate control for age.  He will continue carvedilol 25 mg twice daily, hydrochlorothiazide 25 mg daily, and olmesartan 40 mg daily.  Check BMP.      Relevant Orders   Basic Metabolic Panel (BMET)   Return in about 3 months (around 12/31/2020).  This visit occurred during the SARS-CoV-2 public health emergency.  Safety protocols were in place, including screening questions prior to the visit, additional usage of staff PPE, and extensive cleaning of exam room while  observing appropriate contact time as indicated for disinfecting solutions.    [name], MD [place]

## 2020-10-08 ENCOUNTER — Ambulatory Visit: Payer: Medicare Other

## 2020-10-10 ENCOUNTER — Telehealth: Payer: Self-pay | Admitting: Family Medicine

## 2020-10-10 NOTE — Telephone Encounter (Signed)
Anne Ng from home care service, [phone], and fax # [phone]. The Paper that was fax over a few weeks ago requesting extra hours was not clear. She can not see what the hours are, please refax.

## 2020-10-11 ENCOUNTER — Other Ambulatory Visit: Payer: Self-pay | Admitting: Family Medicine

## 2020-10-11 DIAGNOSIS — E1142 Type 2 diabetes mellitus with diabetic polyneuropathy: Secondary | ICD-10-CM

## 2020-10-11 DIAGNOSIS — Z794 Long term (current) use of insulin: Secondary | ICD-10-CM

## 2020-10-14 ENCOUNTER — Telehealth: Payer: Self-pay | Admitting: Family Medicine

## 2020-10-14 ENCOUNTER — Other Ambulatory Visit: Payer: Self-pay

## 2020-10-14 DIAGNOSIS — E1142 Type 2 diabetes mellitus with diabetic polyneuropathy: Secondary | ICD-10-CM

## 2020-10-14 MED ORDER — INSULIN LISPRO (1 UNIT DIAL) 100 UNIT/ML (KWIKPEN)
PEN_INJECTOR | SUBCUTANEOUS | 2 refills | Status: DC
Start: 2020-10-14 — End: 2020-10-14

## 2020-10-14 MED ORDER — INSULIN LISPRO (1 UNIT DIAL) 100 UNIT/ML (KWIKPEN): PEN_INJECTOR | SUBCUTANEOUS | 2 refills | Status: DC

## 2020-10-14 NOTE — Telephone Encounter (Signed)
Insulin has been refilled

## 2020-10-14 NOTE — Telephone Encounter (Signed)
Pt insulin has been refilled.

## 2020-10-14 NOTE — Addendum Note (Signed)
Addended by: Ezequiel Ganser on: 10/14/2020 05:05 PM   Modules accepted: Orders

## 2020-10-14 NOTE — Telephone Encounter (Signed)
Patient is calling in to request a refill on his insulin lispro (HUMALOG KWIKPEN) 100 UNIT/ML KwikPen. Please send it to the [facility] on [address]

## 2020-10-15 NOTE — Progress Notes (Signed)
PROVIDER NOTE: Information contained herein reflects review and annotations entered in association with encounter. Interpretation of such information and data should be left to medically-trained personnel. Information provided to patient can be located elsewhere in the medical record under "Patient Instructions". Document created using STT-dictation technology, any transcriptional errors that may result from process are unintentional.    Patient: Alan Schmidt  Service Category: E/M  Provider: Gaspar Cola, MD  DOB: 21-May-1945  DOS: 10/16/2020  Specialty: Interventional Pain Management  MRN: [id number]  Setting: Ambulatory outpatient  PCP: [name], MD  Type: Established Patient    Referring Provider: [name], MD  Location: Office  Delivery: Face-to-face     HPI  [name], a 75 y.o. year old male, is here today because of his Chronic bilateral low back pain without sciatica [M54.50, G89.29]. Alan Schmidt primary complain today is Hip Pain (Right.  Multiple surgeries.  S/p joint replacement with revision.), Back Pain (Lumbar bilateral ), and Neck Pain (Bilateral worse on the right. ) Last encounter: My last encounter with him was on 07/24/2020. Pertinent problems: Alan Schmidt has Chronic low back pain (Bilateral) (R>L) w/ sciatica (Bilateral); Lumbar facet syndrome (Bilateral) (R>L); Lumbar spondylosis; Diabetic peripheral neuropathy ([place]); Chronic neck pain (midline over the C7 spinous processes) (L>R); Neurogenic pain; Neuropathic pain; Chronic lower extremity pain (2ry area of Pain) (Bilateral) (R>L); 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) (Right L5 dermatome); History of total hip replacement (Right); Chronic hip pain (Right); Abnormal nerve conduction studies (severe bilateral lower extremity polyneuropathy); Chronic shoulder pain (Bilateral) (R>L); Occipital neuralgia (Left); Cervicogenic headache (Left); Chronic shoulder radicular pain (Left);  Chronic cervical radicular pain (Left); Chronic pain syndrome; Lumbar facet joint osteoarthritis (Bilateral); Chronic headache; Chronic tension-type headache, intractable; Coccygodynia; DDD (degenerative disc disease), lumbar; Chronic musculoskeletal pain; Chronic knee pain (Bilateral); Thumb pain (Right); 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 Pain Assessment: Severity of Chronic pain is reported as a 2 /10. Location: Hip (see visit info for all pain sites.) Left/hip pain down left leg.  back pain into hips and legs.  neck pain mid scapula. Onset: More than a month ago. Quality: Discomfort, Constant, Nagging. Timing: Constant. Modifying factor(s): topical analgesics.  medications.  pain medications. Vitals:  height is 6' 1"  (1.854 m) and weight is 231 lb (104.8 kg). His temporal temperature is 97.2 F (36.2 C) (abnormal). His blood pressure is 111/69 and his pulse is 82. His respiration is 16 and oxygen saturation is 97%.   Reason for encounter: medication management.   The patient indicates doing well with the current medication regimen. No adverse reactions or side effects reported to the medications.   UDS ordered today. Abnormal 07/24/2020 UDS (+) one reported oxycodone, not reported on PMP.  The patient has been having some issues with his supply of medication.  Unfortunately, that is completely out of my hands.  He is thinking about switching his pharmacy due to the fact that he keeps encountering problems with not being able to get his medicine on time.  Today he asked me if I could increase his pain medicine, but I have explained to him that that is not appropriate.  This question led to a long  conversation regarding the reason why he was asking for  that increase and I have explained to him about tolerance and the use of drug holidays for the treatment of such tolerance.  Today I have offered him to get him started on a drug holiday, but he seems to be afraid of coming off of the education.  He also indicated that he has been off of the medication for a couple days, several times, secondary to this supply issue.  I have explained to him that that is not the same as a formal "Drug Holiday".  I have explained to him the reasons why it is different and I have provided him today with written information regarding those drug holidays.  Today he also had some questions regarding problems finding his insulin, but I have referred him to his primary care physician for help since this is completely out of my realm of expertise.  He understood and accepted.  RTCB: 01/20/2021 Nonopioids transferred 12/14/2019: Gabapentin, Amitiza, Benefiber, Mobic  Pharmacotherapy Assessment  Analgesic: Morphine ER (MS Contin) 30 mg, 1 tab p.o. twice daily (60 mg/day).  (Previously on oxycodone IR 10 mg every 6 hours (40 mg/day)) MME/day: 60 mg/day.   Monitoring: [place] PMP: PDMP reviewed during this encounter.       Pharmacotherapy: No side-effects or adverse reactions reported. Compliance: No problems identified. Effectiveness: Clinically acceptable.  Alan Billow, RN  10/16/2020  9:39 AM  Sign when Signing Visit Nursing Pain Medication Assessment:  Safety precautions to be maintained throughout the outpatient stay will include: orient to surroundings, keep bed in low position, maintain call bell within reach at all times, provide assistance with transfer out of bed and ambulation.  Medication Inspection Compliance: Pill count conducted under aseptic conditions, in front of the patient. Neither the pills nor the bottle was removed from the patient's sight at any time. Once count was completed pills were  immediately returned to the patient in their original bottle.  Medication: Morphine ER (MSContin) Pill/Patch Count:  3 of 60 pills remain Pill/Patch Appearance: Markings consistent with prescribed medication Bottle Appearance: Standard pharmacy container. Clearly labeled. Filled Date: 08 / 13 / 2022 Last Medication intake:  Today    UDS:  Summary  Date Value Ref Range Status  07/24/2020 Note  Final    Comment:    ==================================================================== Tox[employer]SU[employer] Select 13 (MW) ==================================================================== Test                             Result       Flag       Units  Drug Present and Declared for Prescription Verification   Alprazolam                     49           EXPECTED   ng/mg creat   Alpha-hydroxyalprazolam        130          EXPECTED   ng/mg creat    Source of alprazolam is a scheduled prescription medication. Alpha-    hydroxyalprazolam is an expected metabolite of alprazolam.    Morphine                       >11111       EXPECTED   ng/mg creat   Normorphine                    446  EXPECTED   ng/mg creat    Potential sources of large amounts of morphine in the absence of    codeine include administration of morphine or use of heroin.     Normorphine is an expected metabolite of morphine.    Hydromorphone                  189          EXPECTED   ng/mg creat    Hydromorphone may be present as a metabolite of morphine;    concentrations of hydromorphone rarely exceed 5% of the morphine    concentration when this is the source of hydromorphone.  Drug Present not Declared for Prescription Verification   Oxycodone                      443          UNEXPECTED ng/mg creat   Noroxycodone                   449          UNEXPECTED ng/mg creat    Sources of oxycodone include scheduled prescription medications.    Noroxycodone is an expected metabolite of  oxycodone.  ==================================================================== Test                      Result    Flag   Units      Ref Range   Creatinine              90               mg/dL      >=20 ==================================================================== Declared Medications:  The flagging and interpretation on this report are based on the  following declared medications.  Unexpected results may arise from  inaccuracies in the declared medications.   **Note: The testing scope of this panel includes these medications:   Alprazolam  Morphine (MS Contin)   **Note: The testing scope of this panel does not include the  following reported medications:   Acetaminophen  Aspirin  Carvedilol  Celecoxib  Desoximetasone  Docusate  Empagliflozin  Epinephrine (EpiPen)  Escitalopram  Gabapentin  Hydrochlorothiazide  Insulin Tyler Aas)  Loratadine  Lubiprostone  Metformin  Mometasone  Naloxone  Olmesartan (Benicar)  Pantoprazole  Pravastatin  Semaglutide  Sennosides  Supplement  Tamsulosin ==================================================================== For clinical consultation, please call [phone]. ====================================================================      ROS  Constitutional: Denies any fever or chills Gastrointestinal: No reported hemesis, hematochezia, vomiting, or acute GI distress Musculoskeletal: Denies any acute onset joint swelling, redness, loss of ROM, or weakness Neurological: No reported episodes of acute onset apraxia, aphasia, dysarthria, agnosia, amnesia, paralysis, loss of coordination, or loss of consciousness  Medication Review  ALPRAZolam, Benefiber, EPINEPHrine, FreeStyle Libre 14 Day Reader, FreeStyle Libre 14 Day Sensor, Insulin Pen Needle, Semaglutide (2 MG/DOSE), acetaminophen, aspirin EC, buPROPion, carvedilol, celecoxib, desoximetasone, empagliflozin, escitalopram, gabapentin, hydrochlorothiazide, insulin  degludec, insulin lispro, loratadine, lubiprostone, metFORMIN, methocarbamol, mometasone, morphine, naloxone, olmesartan, pantoprazole, pravastatin, sennosides-docusate sodium, and tamsulosin  History Review  Allergy: Mr. Chhim is allergic to contrast media [iodinated diagnostic agents], iodine, and shellfish allergy. Drug: Mr. Pollio  reports no history of drug use. Alcohol:  reports no history of alcohol use. Tobacco:  reports that he has quit smoking. His smoking use included cigarettes. He has never used smokeless tobacco. Social: Mr. Jelinski  reports that he has quit smoking. His smoking use included cigarettes. He has  never used smokeless tobacco. He reports that he does not drink alcohol and does not use drugs. Medical:  has a past medical history of Acute postoperative pain (11/24/2016), Anxiety, Chronic hip pain (Right) (12/05/2014), Chronic lumbar pain, Depression, Diabetes mellitus without complication ([place]), Hyperlipidemia, Hypertension, Kidney stones, and Migraines. Surgical: Mr. Marney  has a past surgical history that includes Total hip arthroplasty; Tonsillectomy; and right hip surgery. Family: family history includes Cancer in his mother; Diabetes in his father, sister, and sister; Heart disease in his father; Stroke in his father.  Laboratory Chemistry Profile   Renal Lab Results  Component Value Date   BUN 10 09/30/2020   CREATININE 0.97 09/30/2020   GFR 76.84 09/30/2020   GFRAA >60 03/26/2018   GFRNONAA >60 03/26/2018    Hepatic Lab Results  Component Value Date   AST 19 03/22/2020   ALT 26 03/22/2020   ALBUMIN 4.1 03/22/2020   ALKPHOS 99 03/22/2020    Electrolytes Lab Results  Component Value Date   NA 139 09/30/2020   K 4.2 09/30/2020   CL 100 09/30/2020   CALCIUM 9.5 09/30/2020   MG 1.8 03/06/2015    Bone No results found for: VD25OH, VD125OH2TOT, ZO1096EA5, WU9811BJ4, 25OHVITD1, 25OHVITD2, 25OHVITD3, TESTOFREE, TESTOSTERONE  Inflammation (CRP:  Acute Phase) (ESR: Chronic Phase) Lab Results  Component Value Date   CRP 0.5 03/06/2015   ESRSEDRATE 45 (H) 12/14/2016         Note: Above Lab results reviewed.  Recent Imaging Review  CT RENAL STONE STUDY CLINICAL DATA:  Left lower quadrant abdominal pain. History of nephrolithiasis.  EXAM: CT ABDOMEN AND PELVIS WITHOUT CONTRAST  TECHNIQUE: Multidetector CT imaging of the abdomen and pelvis was performed following the standard protocol without IV contrast.  COMPARISON:  10/25/2016 CT abdomen/pelvis.  FINDINGS: Lower chest: No significant pulmonary nodules or acute consolidative airspace disease. Coronary atherosclerosis.  Hepatobiliary: Normal liver size. Scattered punctate granulomatous calcifications in the liver are unchanged. No liver masses. Normal gallbladder with no radiopaque cholelithiasis. No biliary ductal dilatation.  Pancreas: Normal, with no mass or duct dilation.  Spleen: Normal size spleen. Granulomatous splenic calcifications. No splenic masses.  Adrenals/Urinary Tract: Normal adrenals. No renal stones. No hydronephrosis. Simple bilateral renal cysts, largest 3.6 cm in the upper left kidney and 2.9 cm in the posterior interpolar right kidney. Normal caliber ureters. No ureteral stones, noting poor visualization of the pelvic ureters due to streak artifact from right hip hardware. Bladder obscured by streak artifact. A 3 mm calcification projecting over the posterior bladder just to the right of midline (series 2/image 84), not previously visualized on 10/25/2016 CT, cannot exclude a layering bladder stone.  Stomach/Bowel: Normal non-distended stomach. Normal caliber small bowel with no small bowel wall thickening. Normal appendix. Minimal scattered left colonic diverticulosis with no large bowel wall thickening or significant pericolonic fat stranding.  Vascular/Lymphatic: Atherosclerotic nonaneurysmal abdominal aorta. No pathologically  enlarged lymph nodes in the abdomen or pelvis.  Reproductive: Mildly enlarged prostate.  Other: No pneumoperitoneum, ascites or focal fluid collection.  Musculoskeletal: No aggressive appearing focal osseous lesions. Right total hip arthroplasty. Marked lumbar spondylosis.  IMPRESSION: 1. Limited pelvic visualization due to streak artifact from right hip hardware. A 3 mm calcification projecting over the posterior bladder just to the right of midline, not previously visualized on 10/25/2016 CT, cannot exclude a layering bladder stone. No hydronephrosis. No renal collecting system or ureteral stones. 2. Minimal left colonic diverticulosis. 3. Mildly enlarged prostate. 4. Coronary atherosclerosis. 5. Aortic Atherosclerosis (ICD10-I70.0).  Electronically Signed   By: Ilona Sorrel M.D.   On: 05/30/2020 16:17 Note: Reviewed        Physical Exam  General appearance: Well nourished, well developed, and well hydrated. In no apparent acute distress Mental status: Alert, oriented x 3 (person, place, & time)       Respiratory: No evidence of acute respiratory distress Eyes: PERLA Vitals: BP 111/69 (BP Location: Left Arm, Patient Position: Sitting, Cuff Size: Large)   Pulse 82   Temp (!) 97.2 F (36.2 C) (Temporal)   Resp 16   Ht 6' 1"  (1.854 m)   Wt 231 lb (104.8 kg)   SpO2 97%   BMI 30.48 kg/m  BMI: Estimated body mass index is 30.48 kg/m as calculated from the following:   Height as of this encounter: 6' 1"  (1.854 m).   Weight as of this encounter: 231 lb (104.8 kg). Ideal: Ideal body weight: 79.9 kg (176 lb 2.4 oz) Adjusted ideal body weight: 89.9 kg (198 lb 1.4 oz)  Assessment   Status Diagnosis  Controlled Controlled Controlled 1. Chronic low back pain (1ry area of Pain) (Bilateral) w/o sciatica   2. Chronic lower extremity pain (2ry area of Pain) (Bilateral) (R>L)   3. Chronic neck pain (midline over the C7 spinous processes) (L>R)   4. Chronic hip pain after total  replacement of hip joint (Right)   5. Chronic knee pain (Bilateral)   6. Lumbar facet syndrome (Bilateral) (R>L)   7. Chronic pain syndrome   8. Pharmacologic therapy   9. Chronic use of opiate for therapeutic purpose   10. Encounter for chronic pain management   11. Encounter for medication management      Updated Problems: No problems updated.  Plan of Care  Problem-specific:  No problem-specific Assessment & Plan notes found for this encounter.  Mr. BENJI POYNTER has a current medication list which includes the following long-term medication(s): carvedilol, escitalopram, gabapentin, hydrochlorothiazide, insulin lispro, loratadine, lubiprostone, metformin, mometasone, [START ON 10/22/2020] morphine, [START ON 11/21/2020] morphine, [START ON 12/21/2020] morphine, olmesartan, pantoprazole, pravastatin, and benefiber.  Pharmacotherapy (Medications Ordered): Meds ordered this encounter  Medications   morphine (MS CONTIN) 30 MG 12 hr tablet    Sig: Take 1 tablet (30 mg total) by mouth every 12 (twelve) hours. Must last 30 days. Do not break tablet    Dispense:  60 tablet    Refill:  0    Not a duplicate. Do NOT delete! Dispense 1 day early if closed on refill date. Avoid benzodiazepines within 8 hours of opioids. Do not send refill requests.   morphine (MS CONTIN) 30 MG 12 hr tablet    Sig: Take 1 tablet (30 mg total) by mouth every 12 (twelve) hours. Must last 30 days. Do not break tablet    Dispense:  60 tablet    Refill:  0    Not a duplicate. Do NOT delete! Dispense 1 day early if closed on refill date. Avoid benzodiazepines within 8 hours of opioids. Do not send refill requests.   morphine (MS CONTIN) 30 MG 12 hr tablet    Sig: Take 1 tablet (30 mg total) by mouth every 12 (twelve) hours. Must last 30 days. Do not break tablet    Dispense:  60 tablet    Refill:  0    Not a duplicate. Do NOT delete! Dispense 1 day early if closed on refill date. Avoid benzodiazepines within 8  hours of opioids. Do not send refill requests.  Orders:  Orders Placed This Encounter  Procedures   ToxASSURE Select 13 (MW), Urine    Volume: 30 ml(s). Minimum 3 ml of urine is needed. Document temperature of fresh sample. Indications: Long term (current) use of opiate analgesic (U83.475)    Order Specific Question:   Release to patient    Answer:   Immediate    Follow-up plan:   Return in about 3 months (around 01/20/2021) for Eval-day (M,W), (F2F), (MM).     Interventional Therapies  Risk  Complexity Considerations:   Estimated body mass index is 31.53 kg/m as calculated from the following:   Height as of this encounter: 6' 1"  (1.854 m).   Weight as of this encounter: 239 lb (108.4 kg). NO MORE RFA - intraoperative noncompliance, unnecessarily increasing radiation exposure NOTE: NO MORE RFA procedures. Difficulty following intra-procedural instructions and commands, resulting in unnecessarily long exposure of staff to radiation.   Planned  Pending:   Pending further evaluation   Under consideration:   Diagnostic Left GONB    Completed:   Therapeutic left lumbar facet RFA x2 (05/09/2019)  Therapeutic right lumbar facet RFA x6 (12/14/2019)    Therapeutic  Palliative (PRN) options:   Palliative right lumbar facet block #3  Palliative left lumbar facet block #3  Therapeutic left cervical ESI #2     Recent Visits Date Type Provider Dept  07/24/20 Office Visit Milinda Pointer, MD [facility]  Showing recent visits within past 90 days and meeting all other requirements Today's Visits Date Type Provider Dept  10/16/20 Office Visit Milinda Pointer, MD [facility]  Showing today's visits and meeting all other requirements Future Appointments No visits were found meeting these conditions. Showing future appointments within next 90 days and meeting all other requirements I discussed the assessment and treatment plan with the patient. The  patient was provided an opportunity to ask questions and all were answered. The patient agreed with the plan and demonstrated an understanding of the instructions.  Patient advised to call back or seek an in-person evaluation if the symptoms or condition worsens.  Duration of encounter: 30 minutes.  Note by: Gaspar Cola, MD Date: 10/16/2020; Time: 10:11 AM

## 2020-10-16 ENCOUNTER — Other Ambulatory Visit: Payer: Self-pay

## 2020-10-16 ENCOUNTER — Ambulatory Visit: Payer: Medicare Other | Attending: Pain Medicine | Admitting: Pain Medicine

## 2020-10-16 ENCOUNTER — Encounter: Payer: Self-pay | Admitting: Pain Medicine

## 2020-10-16 VITALS — BP 111/69 | HR 82 | Temp 97.2°F | Resp 16 | Ht 73.0 in | Wt 231.0 lb

## 2020-10-16 DIAGNOSIS — M545 Low back pain, unspecified: Secondary | ICD-10-CM | POA: Insufficient documentation

## 2020-10-16 DIAGNOSIS — M47816 Spondylosis without myelopathy or radiculopathy, lumbar region: Secondary | ICD-10-CM | POA: Diagnosis not present

## 2020-10-16 DIAGNOSIS — M25562 Pain in left knee: Secondary | ICD-10-CM | POA: Insufficient documentation

## 2020-10-16 DIAGNOSIS — Z79891 Long term (current) use of opiate analgesic: Secondary | ICD-10-CM | POA: Diagnosis not present

## 2020-10-16 DIAGNOSIS — G894 Chronic pain syndrome: Secondary | ICD-10-CM | POA: Insufficient documentation

## 2020-10-16 DIAGNOSIS — M79604 Pain in right leg: Secondary | ICD-10-CM | POA: Diagnosis not present

## 2020-10-16 DIAGNOSIS — Z96641 Presence of right artificial hip joint: Secondary | ICD-10-CM | POA: Insufficient documentation

## 2020-10-16 DIAGNOSIS — M79605 Pain in left leg: Secondary | ICD-10-CM | POA: Insufficient documentation

## 2020-10-16 DIAGNOSIS — G8929 Other chronic pain: Secondary | ICD-10-CM | POA: Insufficient documentation

## 2020-10-16 DIAGNOSIS — Z79899 Other long term (current) drug therapy: Secondary | ICD-10-CM | POA: Diagnosis not present

## 2020-10-16 DIAGNOSIS — M25551 Pain in right hip: Secondary | ICD-10-CM | POA: Diagnosis not present

## 2020-10-16 DIAGNOSIS — M25561 Pain in right knee: Secondary | ICD-10-CM | POA: Diagnosis not present

## 2020-10-16 DIAGNOSIS — M542 Cervicalgia: Secondary | ICD-10-CM | POA: Diagnosis not present

## 2020-10-16 MED ORDER — MORPHINE SULFATE ER 30 MG PO TBCR: 30.0000 mg | EXTENDED_RELEASE_TABLET | Freq: Two times a day (BID) | ORAL | 0 refills | Status: DC

## 2020-10-16 NOTE — Patient Instructions (Signed)
____________________________________________________________________________________________  Medication Rules  Purpose: To inform patients, and their family members, of our rules and regulations.  Applies to: All patients receiving prescriptions (written or electronic).  Pharmacy of record: Pharmacy where electronic prescriptions will be sent. If written prescriptions are taken to a different pharmacy, please inform the nursing staff. The pharmacy listed in the electronic medical record should be the one where you would like electronic prescriptions to be sent.  Electronic prescriptions: In compliance with the [place] Strengthen Opioid Misuse Prevention (STOP) Act of 2017 (Session Law 2017-74/H243), effective February 02, 2018, all controlled substances must be electronically prescribed. Calling prescriptions to the pharmacy will cease to exist.  Prescription refills: Only during scheduled appointments. Applies to all prescriptions.  NOTE: The following applies primarily to controlled substances (Opioid* Pain Medications).   Type of encounter (visit): For patients receiving controlled substances, face-to-face visits are required. (Not an option or up to the patient.)  Patient's responsibilities: Pain Pills: Bring all pain pills to every appointment (except for procedure appointments). Pill Bottles: Bring pills in original pharmacy bottle. Always bring the newest bottle. Bring bottle, even if empty. Medication refills: You are responsible for knowing and keeping track of what medications you take and those you need refilled. The day before your appointment: write a list of all prescriptions that need to be refilled. The day of the appointment: give the list to the admitting nurse. Prescriptions will be written only during appointments. No prescriptions will be written on procedure days. If you forget a medication: it will not be "Called in", "Faxed", or "electronically sent". You will  need to get another appointment to get these prescribed. No early refills. Do not call asking to have your prescription filled early. Prescription Accuracy: You are responsible for carefully inspecting your prescriptions before leaving our office. Have the discharge nurse carefully go over each prescription with you, before taking them home. Make sure that your name is accurately spelled, that your address is correct. Check the name and dose of your medication to make sure it is accurate. Check the number of pills, and the written instructions to make sure they are clear and accurate. Make sure that you are given enough medication to last until your next medication refill appointment. Taking Medication: Take medication as prescribed. When it comes to controlled substances, taking less pills or less frequently than prescribed is permitted and encouraged. Never take more pills than instructed. Never take medication more frequently than prescribed.  Inform other Doctors: Always inform, all of your healthcare providers, of all the medications you take. Pain Medication from other Providers: You are not allowed to accept any additional pain medication from any other Doctor or Healthcare provider. There are two exceptions to this rule. (see below) In the event that you require additional pain medication, you are responsible for notifying us, as stated below. Cough Medicine: Often these contain an opioid, such as codeine or hydrocodone. Never accept or take cough medicine containing these opioids if you are already taking an opioid* medication. The combination may cause respiratory failure and death. Medication Agreement: You are responsible for carefully reading and following our Medication Agreement. This must be signed before receiving any prescriptions from our practice. Safely store a copy of your signed Agreement. Violations to the Agreement will result in no further prescriptions. (Additional copies of our  Medication Agreement are available upon request.) Laws, Rules, & Regulations: All patients are expected to follow all Federal and State Laws, Statutes, Rules, & Regulations. Ignorance of   the Laws does not constitute a valid excuse.  Illegal drugs and Controlled Substances: The use of illegal substances (including, but not limited to marijuana and its derivatives) and/or the illegal use of any controlled substances is strictly prohibited. Violation of this rule may result in the immediate and permanent discontinuation of any and all prescriptions being written by our practice. The use of any illegal substances is prohibited. Adopted CDC guidelines & recommendations: Target dosing levels will be at or below 60 MME/day. Use of benzodiazepines** is not recommended.  Exceptions: There are only two exceptions to the rule of not receiving pain medications from other Healthcare Providers. Exception #1 (Emergencies): In the event of an emergency (i.e.: accident requiring emergency care), you are allowed to receive additional pain medication. However, you are responsible for: As soon as you are able, call our office [phone], at any time of the day or night, and leave a message stating your name, the date and nature of the emergency, and the name and dose of the medication prescribed. In the event that your call is answered by a member of our staff, make sure to document and save the date, time, and the name of the person that took your information.  Exception #2 (Planned Surgery): In the event that you are scheduled by another doctor or dentist to have any type of surgery or procedure, you are allowed (for a period no longer than 30 days), to receive additional pain medication, for the acute post-op pain. However, in this case, you are responsible for picking up a copy of our "Post-op Pain Management for Surgeons" handout, and giving it to your surgeon or dentist. This document is available at our office, and  does not require an appointment to obtain it. Simply go to our office during business hours (Monday-Thursday from 8:00 AM to 4:00 PM) (Friday 8:00 AM to 12:00 Noon) or if you have a scheduled appointment with us, prior to your surgery, and ask for it by name. In addition, you are responsible for: calling our office [phone], at any time of the day or night, and leaving a message stating your name, name of your surgeon, type of surgery, and date of procedure or surgery. Failure to comply with your responsibilities may result in termination of therapy involving the controlled substances.  *Opioid medications include: morphine, codeine, oxycodone, oxymorphone, hydrocodone, hydromorphone, meperidine, tramadol, tapentadol, buprenorphine, fentanyl, methadone. **Benzodiazepine medications include: diazepam (Valium), alprazolam (Xanax), clonazepam (Klonopine), lorazepam (Ativan), clorazepate (Tranxene), chlordiazepoxide (Librium), estazolam (Prosom), oxazepam (Serax), temazepam (Restoril), triazolam (Halcion) (Last updated: 01/01/2020) ____________________________________________________________________________________________  ____________________________________________________________________________________________  Medication Recommendations and Reminders  Applies to: All patients receiving prescriptions (written and/or electronic).  Medication Rules & Regulations: These rules and regulations exist for your safety and that of others. They are not flexible and neither are we. Dismissing or ignoring them will be considered "non-compliance" with medication therapy, resulting in complete and irreversible termination of such therapy. (See document titled "Medication Rules" for more details.) In all conscience, because of safety reasons, we cannot continue providing a therapy where the patient does not follow instructions.  Pharmacy of record:  Definition: This is the pharmacy where your electronic  prescriptions will be sent.  We do not endorse any particular pharmacy, however, we have experienced problems with [facility] not securing enough medication supply for the community. We do not restrict you in your choice of pharmacy. However, once we write for your prescriptions, we will NOT be re-sending more prescriptions to fix restricted supply problems   created by your pharmacy, or your insurance.  The pharmacy listed in the electronic medical record should be the one where you want electronic prescriptions to be sent. If you choose to change pharmacy, simply notify our nursing staff.  Recommendations: Keep all of your pain medications in a safe place, under lock and key, even if you live alone. We will NOT replace lost, stolen, or damaged medication. After you fill your prescription, take 1 week's worth of pills and put them away in a safe place. You should keep a separate, properly labeled bottle for this purpose. The remainder should be kept in the original bottle. Use this as your primary supply, until it runs out. Once it's gone, then you know that you have 1 week's worth of medicine, and it is time to come in for a prescription refill. If you do this correctly, it is unlikely that you will ever run out of medicine. To make sure that the above recommendation works, it is very important that you make sure your medication refill appointments are scheduled at least 1 week before you run out of medicine. To do this in an effective manner, make sure that you do not leave the office without scheduling your next medication management appointment. Always ask the nursing staff to show you in your prescription , when your medication will be running out. Then arrange for the receptionist to get you a return appointment, at least 7 days before you run out of medicine. Do not wait until you have 1 or 2 pills left, to come in. This is very poor planning and does not take into consideration that we may need to  cancel appointments due to bad weather, sickness, or emergencies affecting our staff. DO NOT ACCEPT A "Partial Fill": If for any reason your pharmacy does not have enough pills/tablets to completely fill or refill your prescription, do not allow for a "partial fill". The law allows the pharmacy to complete that prescription within 72 hours, without requiring a new prescription. If they do not fill the rest of your prescription within those 72 hours, you will need a separate prescription to fill the remaining amount, which we will NOT provide. If the reason for the partial fill is your insurance, you will need to talk to the pharmacist about payment alternatives for the remaining tablets, but again, DO NOT ACCEPT A PARTIAL FILL, unless you can trust your pharmacist to obtain the remainder of the pills within 72 hours.  Prescription refills and/or changes in medication(s):  Prescription refills, and/or changes in dose or medication, will be conducted only during scheduled medication management appointments. (Applies to both, written and electronic prescriptions.) No refills on procedure days. No medication will be changed or started on procedure days. No changes, adjustments, and/or refills will be conducted on a procedure day. Doing so will interfere with the diagnostic portion of the procedure. No phone refills. No medications will be "called into the pharmacy". No Fax refills. No weekend refills. No Holliday refills. No after hours refills.  Remember:  Business hours are:  Monday to Thursday 8:00 AM to 4:00 PM Provider's Schedule: Leevi Cullars, MD - Appointments are:  Medication management: Monday and Wednesday 8:00 AM to 4:00 PM Procedure day: Tuesday and Thursday 7:30 AM to 4:00 PM Bilal Lateef, MD - Appointments are:  Medication management: Tuesday and Thursday 8:00 AM to 4:00 PM Procedure day: Monday and Wednesday 7:30 AM to 4:00 PM (Last update:  08/23/2019) ____________________________________________________________________________________________  ____________________________________________________________________________________________  CBD (cannabidiol) WARNING    Applicable to: All individuals currently taking or considering taking CBD (cannabidiol) and, more important, all patients taking opioid analgesic controlled substances (pain medication). (Example: oxycodone; oxymorphone; hydrocodone; hydromorphone; morphine; methadone; tramadol; tapentadol; fentanyl; buprenorphine; butorphanol; dextromethorphan; meperidine; codeine; etc.)  Legal status: CBD remains a Schedule I drug prohibited for any use. CBD is illegal with one exception. In the [place], CBD has a limited Food and Drug Administration (FDA) approval for the treatment of two specific types of epilepsy disorders. Only one CBD product has been approved by the FDA for this purpose: "Epidiolex". FDA is aware that some companies are marketing products containing cannabis and cannabis-derived compounds in ways that violate the Federal Food, Drug and Cosmetic Act (FD&C Act) and that may put the health and safety of consumers at risk. The FDA, a Federal agency, has not enforced the CBD status since 2018.   Legality: Some manufacturers ship CBD products nationally, which is illegal. Often such products are sold online and are therefore available throughout the country. CBD is openly sold in head shops and health food stores in some states where such sales have not been explicitly legalized. Selling unapproved products with unsubstantiated therapeutic claims is not only a violation of the law, but also can put patients at risk, as these products have not been proven to be safe or effective. Federal illegality makes it difficult to conduct research on CBD.  Reference: "FDA Regulation of Cannabis and Cannabis-Derived Products, Including Cannabidiol (CBD)" -  https://www.fda.gov/news-events/public-health-focus/fda-regulation-cannabis-and-cannabis-derived-products-including-cannabidiol-cbd  Warning: CBD is not FDA approved and has not undergo the same manufacturing controls as prescription drugs.  This means that the purity and safety of available CBD may be questionable. Most of the time, despite manufacturer's claims, it is contaminated with THC (delta-9-tetrahydrocannabinol - the chemical in marijuana responsible for the "HIGH").  When this is the case, the THC contaminant will trigger a positive urine drug screen (UDS) test for Marijuana (carboxy-THC). Because a positive UDS for any illicit substance is a violation of our medication agreement, your opioid analgesics (pain medicine) may be permanently discontinued.  MORE ABOUT CBD  General Information: CBD  is a derivative of the Marijuana (cannabis sativa) plant discovered in 1940. It is one of the 113 identified substances found in Marijuana. It accounts for up to 40% of the plant's extract. As of 2018, preliminary clinical studies on CBD included research for the treatment of anxiety, movement disorders, and pain. CBD is available and consumed in multiple forms, including inhalation of smoke or vapor, as an aerosol spray, and by mouth. It may be supplied as an oil containing CBD, capsules, dried cannabis, or as a liquid solution. CBD is thought not to be as psychoactive as THC (delta-9-tetrahydrocannabinol - the chemical in marijuana responsible for the "HIGH"). Studies suggest that CBD may interact with different biological target receptors in the body, including cannabinoid and other neurotransmitter receptors. As of 2018 the mechanism of action for its biological effects has not been determined.  Side-effects  Adverse reactions: Dry mouth, diarrhea, decreased appetite, fatigue, drowsiness, malaise, weakness, sleep disturbances, and others.  Drug interactions: CBC may interact with other medications  such as blood-thinners. (Last update: 09/09/2019) ____________________________________________________________________________________________  ____________________________________________________________________________________________  Drug Holidays (Slow)  What is a "Drug Holiday"? Drug Holiday: is the name given to the period of time during which a patient stops taking a medication(s) for the purpose of eliminating tolerance to the drug.  Benefits Improved effectiveness of opioids. Decreased opioid dose needed to achieve benefits. Improved pain with lesser dose.    What is tolerance? Tolerance: is the progressive decreased in effectiveness of a drug due to its repetitive use. With repetitive use, the body gets use to the medication and as a consequence, it loses its effectiveness. This is a common problem seen with opioid pain medications. As a result, a larger dose of the drug is needed to achieve the same effect that used to be obtained with a smaller dose.  How long should a "Drug Holiday" last? You should stay off of the pain medicine for at least 14 consecutive days. (2 weeks)  Should I stop the medicine "cold turkey"? No. You should always coordinate with your Pain Specialist so that he/she can provide you with the correct medication dose to make the transition as smoothly as possible.  How do I stop the medicine? Slowly. You will be instructed to decrease the daily amount of pills that you take by one (1) pill every seven (7) days. This is called a "slow downward taper" of your dose. For example: if you normally take four (4) pills per day, you will be asked to drop this dose to three (3) pills per day for seven (7) days, then to two (2) pills per day for seven (7) days, then to one (1) per day for seven (7) days, and at the end of those last seven (7) days, this is when the "Drug Holiday" would start.   Will I have withdrawals? By doing a "slow downward taper" like this one, it  is unlikely that you will experience any significant withdrawal symptoms. Typically, what triggers withdrawals is the sudden stop of a high dose opioid therapy. Withdrawals can usually be avoided by slowly decreasing the dose over a prolonged period of time. If you do not follow these instructions and decide to stop your medication abruptly, withdrawals may be possible.  What are withdrawals? Withdrawals: refers to the wide range of symptoms that occur after stopping or dramatically reducing opiate drugs after heavy and prolonged use. Withdrawal symptoms do not occur to patients that use low dose opioids, or those who take the medication sporadically. Contrary to benzodiazepine (example: Valium, Xanax, etc.) or alcohol withdrawals ("Delirium Tremens"), opioid withdrawals are not lethal. Withdrawals are the physical manifestation of the body getting rid of the excess receptors.  Expected Symptoms Early symptoms of withdrawal may include: Agitation Anxiety Muscle aches Increased tearing Insomnia Runny nose Sweating Yawning  Late symptoms of withdrawal may include: Abdominal cramping Diarrhea Dilated pupils Goose bumps Nausea Vomiting  Will I experience withdrawals? Due to the slow nature of the taper, it is very unlikely that you will experience any.  What is a slow taper? Taper: refers to the gradual decrease in dose.  (Last update: 08/23/2019) ____________________________________________________________________________________________    

## 2020-10-16 NOTE — Progress Notes (Signed)
Nursing Pain Medication Assessment:  Safety precautions to be maintained throughout the outpatient stay will include: orient to surroundings, keep bed in low position, maintain call bell within reach at all times, provide assistance with transfer out of bed and ambulation.  Medication Inspection Compliance: Pill count conducted under aseptic conditions, in front of the patient. Neither the pills nor the bottle was removed from the patient's sight at any time. Once count was completed pills were immediately returned to the patient in their original bottle.  Medication: Morphine ER (MSContin) Pill/Patch Count:  3 of 60 pills remain Pill/Patch Appearance: Markings consistent with prescribed medication Bottle Appearance: Standard pharmacy container. Clearly labeled. Filled Date: 08 / 13 / 2022 Last Medication intake:  Today

## 2020-10-17 NOTE — Telephone Encounter (Signed)
I refaxed the form to [place].  Maley Venezia,cma

## 2020-10-18 ENCOUNTER — Other Ambulatory Visit: Payer: Self-pay | Admitting: Family Medicine

## 2020-10-18 ENCOUNTER — Other Ambulatory Visit: Payer: Self-pay | Admitting: Pain Medicine

## 2020-10-18 DIAGNOSIS — T402X5A Adverse effect of other opioids, initial encounter: Secondary | ICD-10-CM

## 2020-10-18 DIAGNOSIS — K5903 Drug induced constipation: Secondary | ICD-10-CM

## 2020-10-18 DIAGNOSIS — F419 Anxiety disorder, unspecified: Secondary | ICD-10-CM

## 2020-10-18 DIAGNOSIS — F32A Depression, unspecified: Secondary | ICD-10-CM

## 2020-10-18 DIAGNOSIS — E1142 Type 2 diabetes mellitus with diabetic polyneuropathy: Secondary | ICD-10-CM

## 2020-10-19 LAB — TOXASSURE SELECT 13 (MW), URINE

## 2020-10-21 DIAGNOSIS — M542 Cervicalgia: Secondary | ICD-10-CM | POA: Diagnosis not present

## 2020-10-21 DIAGNOSIS — M545 Low back pain, unspecified: Secondary | ICD-10-CM | POA: Diagnosis not present

## 2020-10-22 DIAGNOSIS — Z794 Long term (current) use of insulin: Secondary | ICD-10-CM | POA: Diagnosis not present

## 2020-10-22 DIAGNOSIS — E118 Type 2 diabetes mellitus with unspecified complications: Secondary | ICD-10-CM | POA: Diagnosis not present

## 2020-10-23 ENCOUNTER — Other Ambulatory Visit: Payer: Self-pay | Admitting: Family Medicine

## 2020-10-23 DIAGNOSIS — G894 Chronic pain syndrome: Secondary | ICD-10-CM

## 2020-10-29 ENCOUNTER — Telehealth: Payer: Self-pay | Admitting: Pharmacist

## 2020-10-29 NOTE — Telephone Encounter (Signed)
Received voicemail from patient asking if he is supposed to be taking both escitalopram and bupropion.   Called patient back. Advised that he is to be on both agents. He notes that the pharmacist at [facility] asked if he was supposed to be on both, so he wanted to verify. Patient verbalized understanding.

## 2020-10-31 NOTE — Telephone Encounter (Signed)
Patient calling back in and states that his claim for more time was denied due to them still being unable to read the note.   Wondering if this could be typed and re-sent. Patient unsure if this was hand written or not.   Please advise

## 2020-10-31 NOTE — Telephone Encounter (Signed)
I called and LVM for Annette to call me back to see what the problem is with the form that was faxed.  Katalena Malveaux,cma

## 2020-11-01 NOTE — Telephone Encounter (Signed)
I called and lvm for Annette to call back so I can see what the issue is with the form.  Brain Honeycutt,cma

## 2020-11-03 ENCOUNTER — Other Ambulatory Visit: Payer: Self-pay | Admitting: Family Medicine

## 2020-11-05 NOTE — Telephone Encounter (Signed)
I called and spoke with Anne Ng of the home health and she stated that the fax was fine and she wanted the patient to call [name] not [place] so she stated she will call the patient and inform him to call [facility] because she could read the fax much better when I sent it the second time.  Kenli Waldo,cma

## 2020-11-11 DIAGNOSIS — M5416 Radiculopathy, lumbar region: Secondary | ICD-10-CM | POA: Diagnosis not present

## 2020-11-11 DIAGNOSIS — M5412 Radiculopathy, cervical region: Secondary | ICD-10-CM | POA: Diagnosis not present

## 2020-11-12 ENCOUNTER — Telehealth: Payer: Self-pay | Admitting: Family Medicine

## 2020-11-12 ENCOUNTER — Ambulatory Visit (INDEPENDENT_AMBULATORY_CARE_PROVIDER_SITE_OTHER): Payer: Medicare Other | Admitting: Pharmacist

## 2020-11-12 DIAGNOSIS — E083292 Diabetes mellitus due to underlying condition with mild nonproliferative diabetic retinopathy without macular edema, left eye: Secondary | ICD-10-CM | POA: Diagnosis not present

## 2020-11-12 DIAGNOSIS — E113292 Type 2 diabetes mellitus with mild nonproliferative diabetic retinopathy without macular edema, left eye: Secondary | ICD-10-CM | POA: Diagnosis not present

## 2020-11-12 DIAGNOSIS — E083391 Diabetes mellitus due to underlying condition with moderate nonproliferative diabetic retinopathy without macular edema, right eye: Secondary | ICD-10-CM | POA: Diagnosis not present

## 2020-11-12 DIAGNOSIS — I10 Essential (primary) hypertension: Secondary | ICD-10-CM

## 2020-11-12 DIAGNOSIS — E1142 Type 2 diabetes mellitus with diabetic polyneuropathy: Secondary | ICD-10-CM

## 2020-11-12 DIAGNOSIS — E785 Hyperlipidemia, unspecified: Secondary | ICD-10-CM

## 2020-11-12 DIAGNOSIS — E113391 Type 2 diabetes mellitus with moderate nonproliferative diabetic retinopathy without macular edema, right eye: Secondary | ICD-10-CM | POA: Diagnosis not present

## 2020-11-12 DIAGNOSIS — H524 Presbyopia: Secondary | ICD-10-CM | POA: Diagnosis not present

## 2020-11-12 LAB — HM DIABETES EYE EXAM

## 2020-11-12 MED ORDER — EMPAGLIFLOZIN 25 MG PO TABS: 25.0000 mg | ORAL_TABLET | Freq: Every day | ORAL | 3 refills | Status: DC

## 2020-11-12 MED ORDER — METFORMIN HCL 1000 MG PO TABS: 1000.0000 mg | ORAL_TABLET | Freq: Two times a day (BID) | ORAL | 3 refills | Status: DC

## 2020-11-12 MED ORDER — INSULIN LISPRO (1 UNIT DIAL) 100 UNIT/ML (KWIKPEN): 9.0000 [IU] | PEN_INJECTOR | Freq: Three times a day (TID) | SUBCUTANEOUS | 2 refills | Status: DC

## 2020-11-12 MED ORDER — TRESIBA FLEXTOUCH 100 UNIT/ML ~~LOC~~ SOPN: 12.0000 [IU] | PEN_INJECTOR | Freq: Every day | SUBCUTANEOUS | 2 refills | Status: DC

## 2020-11-12 NOTE — Telephone Encounter (Signed)
Sent to [place] to send the referral today.  Erika Slaby,cma

## 2020-11-12 NOTE — Chronic Care Management (AMB) (Signed)
Chronic Care Management Pharmacy Note  11/12/2020 Name:  Alan Schmidt MRN:  [id number] DOB:  [birth year]  Subjective: Alan Schmidt is an 75 y.o. year old male who is a primary patient of [name], [name], MD.  The CCM team was consulted for assistance with disease management and care coordination needs.    Engaged with patient by telephone for follow up visit in response to provider referral for pharmacy case management and/or care coordination services.   Consent to Services:  The patient was given information about Chronic Care Management services, agreed to services, and gave verbal consent prior to initiation of services.  Please see initial visit note for detailed documentation.   Patient Care Team: [name], MD as PCP - General (Family Medicine) De Hollingshead, RPH-CPP as Pharmacist (Pharmacist) Michiel Cowboy, RN as [place] Management  Objective:  Lab Results  Component Value Date   CREATININE 0.97 09/30/2020   CREATININE 1.09 03/22/2020   CREATININE 1.08 05/15/2019    Lab Results  Component Value Date   HGBA1C 8.5 (H) 09/30/2020   Last diabetic Eye exam:  Lab Results  Component Value Date/Time   HMDIABEYEEXA Retinopathy (A) 11/10/2018 12:00 AM    Last diabetic Foot exam: No results found for: HMDIABFOOTEX      Component Value Date/Time   CHOL 141 03/22/2020 1053   TRIG 77 03/22/2020 1053   HDL 55 03/22/2020 1053   CHOLHDL 3 10/26/2018 1002   VLDL 25.2 10/26/2018 1002   LDLCALC 71 03/22/2020 1053   LDLDIRECT 53.0 05/15/2019 0920    Hepatic Function Latest Ref Rng & Units 03/22/2020 10/26/2018 09/27/2017  Total Protein 6.0 - 8.3 g/dL 7.2 7.2 7.2  Albumin 3.5 - 5.2 g/dL 4.1 4.0 4.1  AST 0 - 37 U/L _0 ALT 0 - 53 U/L 26 25 39  Alk Phosphatase 39 - 117 U/L 99 109 82  Total Bilirubin 0.2 - 1.2 mg/dL 0.4 0.3 0.4  Bilirubin, Direct 0.0 - 0.3 mg/dL - - -    Lab Results  Component Value Date/Time   TSH 2.64  09/27/2017 09:09 AM    CBC Latest Ref Rng & Units 11/17/2018 09/27/2017 10/25/2016  WBC 4.0 - 10.5 K/uL 7.8 7.7 9.4  Hemoglobin 13.0 - 17.0 g/dL 14.8 14.1 13.8  Hematocrit 39.0 - 52.0 % 46.4 43.9 41.5  Platelets 150.0 - 400.0 K/uL 250.0 255.0 278   Social History   Tobacco Use  Smoking Status Former   Types: Cigarettes  Smokeless Tobacco Never   BP Readings from Last 3 Encounters:  10/16/20 111/69  09/30/20 140/80  07/24/20 139/90   Pulse Readings from Last 3 Encounters:  10/16/20 82  09/30/20 92  07/24/20 86   Wt Readings from Last 3 Encounters:  10/16/20 231 lb (104.8 kg)  09/30/20 243 lb (110.2 kg)  07/24/20 239 lb (108.4 kg)    Assessment: Review of patient past medical history, allergies, medications, health status, including review of consultants reports, laboratory and other test data, was performed as part of comprehensive evaluation and provision of chronic care management services.   SDOH:  (Social Determinants of Health) assessments and interventions performed:  SDOH Interventions    Flowsheet Row Most Recent Value  SDOH Interventions   Financial Strain Interventions Intervention Not Indicated       CCM Care Plan  Allergies  Allergen Reactions   Contrast Media [Iodinated Diagnostic Agents] Swelling   Iodine Swelling   Shellfish Allergy  Nausea And Vomiting and Swelling    Medications Reviewed Today     Reviewed by De Hollingshead, RPH-CPP (Pharmacist) on 11/12/20 at 1311  Med List Status: <None>   Medication Order Taking? Sig Documenting Provider Last Dose Status Informant  acetaminophen (TYLENOL) 500 MG tablet [id number]  Take 500 mg by mouth every 6 (six) hours as needed. [provider]  Active            Med Note Darnelle Maffucci, Arville Lime   Wed Sep 11, 2020  1:21 PM)    ALPRAZolam Duanne Moron) 1 MG tablet [id number] Yes TAKE 0.5 TABLETS BY MOUTH 2 TIMES DAILY AS NEEDED FOR ANXIETY. [name], MD Taking Active   aspirin EC 81 MG  tablet [id number] Yes Take 81 mg by mouth daily. [provider] Taking Active   buPROPion (WELLBUTRIN XL) 150 MG 24 hr tablet [id number] Yes TAKE 1 TABLET (150 MG TOTAL) BY MOUTH DAILY FOR 7 DAYS, THEN 2 TABLETS (300 MG TOTAL) DAILY. [name], MD Taking Active   carvedilol (COREG) 25 MG tablet [id number] Yes TAKE 1 TABLET BY MOUTH TWICE A DAY [name] [name], MD Taking Active   Continuous Blood Gluc Receiver (FREESTYLE LIBRE 14 DAY READER) [place] [id number] Yes 1 Device by Does not apply route daily. Use to scan to check blood sugar up to 7 times daily; E11.42, [name], MD Taking Active   Continuous Blood Gluc Sensor (FREESTYLE LIBRE [place]) [place] [id number] Yes 1 Device by Does not apply route every 14 (fourteen) days. E11.42 [name], MD Taking Active   desoximetasone (TOPICORT) 0.25 % cream [id number]  APPLY CREAM TO AFFECTED AREA TWO TIMES DAILY, FOR UP TO 7 DAYS, DO NOT APPLY TO FACE [name], MD  Active   EPINEPHrine (EPIPEN 2-PAK) 0.3 mg/0.3 mL IJ SOAJ injection [id number]  Inject 0.3 mg into the muscle as needed for anaphylaxis. [name], MD  Active   escitalopram (LEXAPRO) 20 MG tablet [id number] Yes TAKE 1 TABLET BY MOUTH EVERY DAY [name], MD Taking Active   gabapentin (NEURONTIN) 250 MG/5ML solution [id number]  TAKE 6 MLS (300 MG TOTAL) BY MOUTH AT BEDTIME. [name], MD  Active   hydrochlorothiazide (HYDRODIURIL) 25 MG tablet [id number] Yes TAKE 1 TABLET BY MOUTH EVERY DAY [name], MD Taking Active   insulin lispro (HUMALOG KWIKPEN) 100 UNIT/ML KwikPen [id number] Yes INJECT 5-7 units INTO THE SKIN three times daily with a meal [name], MD Taking Active   Insulin Pen Needle (BD PEN NEEDLE NANO U/F) 32G X 4 MM MISC [id number]  USE EVERY DAY [name], MD  Active   JARDIANCE 25 MG TABS tablet [id number] Yes TAKE 1 TABLET BY MOUTH EVERY DAY [name], MD Taking Active   loratadine  (CLARITIN) 10 MG tablet [id number] Yes TAKE 1 TABLET BY MOUTH EVERY DAY [name], MD Taking Active   lubiprostone (AMITIZA) 8 MCG capsule [id number] Yes Take 1 capsule (8 mcg total) by mouth 2 (two) times daily with a meal. Swallow the medication whole. Do not break or chew the medication. Milinda Pointer, MD Taking Active            Med Note Kelby Aline Sep 11, 2020  1:24 PM) Taking PRN  metFORMIN (GLUCOPHAGE) 1000 MG tablet [id number] Yes TAKE 1 TABLET BY MOUTH TWICE A DAY WITH MEALS [name], MD Taking Active   methocarbamol (ROBAXIN)  500 MG tablet [id number]  Take 500 mg by mouth 2 (two) times daily. [provider]  Active   mometasone (NASONEX) 50 MCG/ACT nasal spray [id number] Yes USE 2 SPRAYS INTO EACH NOSTRIL ONCE DAILY [name], MD Taking Active   morphine (MS CONTIN) 30 MG 12 hr tablet [id number] Yes Take 1 tablet (30 mg total) by mouth every 12 (twelve) hours. Must last 30 days. Do not break tablet Milinda Pointer, MD Taking Active   morphine (MS CONTIN) 30 MG 12 hr tablet [id number]  Take 1 tablet (30 mg total) by mouth every 12 (twelve) hours. Must last 30 days. Do not break tablet Milinda Pointer, MD  Active   morphine (MS CONTIN) 30 MG 12 hr tablet [id number]  Take 1 tablet (30 mg total) by mouth every 12 (twelve) hours. Must last 30 days. Do not break tablet Milinda Pointer, MD  Active            Med Note Dossie Arbour, [place] A   Wed Oct 16, 2020  9:46 AM) WARNING: Not a Duplicate. Future prescription. DO NOT DELETE during hospital medication reconciliation or at discharge. [facility] Chronic Pain Management Patient   naloxone (NARCAN) 2 MG/2ML injection [id number]  Inject 1 mL (1 mg total) into the muscle as needed for up to 2 doses (for emergency use only). Always have available. Inject into thigh muscle and call [place] Milinda Pointer, MD  Active   olmesartan (BENICAR) 40 MG tablet [id number] Yes TAKE 1 TABLET BY MOUTH EVERY DAY  [name], MD Taking Active   pantoprazole (PROTONIX) 40 MG tablet [id number] Yes TAKE 1 TABLET BY MOUTH EVERY DAY [name], MD Taking Active   pravastatin (PRAVACHOL) 40 MG tablet [id number] Yes TAKE 1 TABLET BY MOUTH EVERY DAY [name], MD Taking Active   Semaglutide, 2 MG/DOSE, (OZEMPIC, 2 MG/DOSE,) 8 MG/3ML SOPN [id number] Yes Inject 2 mg into the skin once a week. [name], MD Taking Active   sennosides-docusate sodium (SENOKOT-S) 8.6-50 MG tablet [id number]  Take 1 tablet by mouth daily as needed.  [provider]  Active   tamsulosin (FLOMAX) 0.4 MG CAPS capsule [id number] Yes TAKE 1 CAPSULE BY MOUTH 2 TIMES DAILY. [name], MD Taking Active   TRESIBA FLEXTOUCH 100 UNIT/ML FlexTouch Pen [id number] Yes INJECT 18 UNITS INTO THE SKIN DAILY AT 10 PM. [name], MD Taking Active   Wheat Dextrin [facility] POWD [id number]  Take 6 g by mouth 3 (three) times daily before meals. (2 tsp = 6 g) Milinda Pointer, MD  Expired 10/16/20 2359   Med List Note Janett Billow, RN 10/16/20 1008): UDS 10/16/20 MR 01/20/21            Patient Active Problem List   Diagnosis Date Noted   DDD (degenerative disc disease), cervical 07/24/2020   Non compliance w medication regimen 07/24/2020   Throat tightness 05/14/2020   Left flank pain 05/14/2020   History of kidney stones 05/14/2020   Chronic use of opiate for therapeutic purpose 04/24/2020   Left-sided back pain 03/27/2020   Excessive gas 03/27/2020   Balance problem 92/42/6834   Uncomplicated opioid dependence ([place]) 01/24/2020   Polypharmacy 01/24/2020   Chronic low back pain (1ry area of Pain) (Bilateral) w/o sciatica 12/14/2019   Pharmacologic therapy 05/29/2019   Disorder of skeletal system 05/29/2019   Problems influencing health status 05/29/2019   Fall 05/15/2019   Skin lesion 05/15/2019   Colon cancer screening 05/15/2019  Osteoarthritis involving multiple joints  02/07/2019   Shortened hamstring muscle 02/07/2019   Spondylosis without myelopathy or radiculopathy, lumbosacral region 12/27/2018   History of allergy to shellfish 12/27/2018   History of allergy to povidone-iodine topical antiseptic 12/27/2018    Class: History of   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) 03/15/2018   Thumb pain (Right) 03/03/2018   Lesion of skin of foot 01/17/2018   Chronic knee pain (Bilateral) 05/18/2017   Coccygodynia 04/19/2017   DDD (degenerative disc disease), lumbar 04/19/2017   Chronic musculoskeletal pain 04/19/2017   Chronic headache 03/16/2017   Chronic tension-type headache, intractable 02/24/2017   Vertigo 02/24/2017   Lumbar facet joint osteoarthritis (Bilateral) 02/16/2017   History of allergy to radiographic contrast media 02/16/2017   Nevus 12/10/2016   History of postoperative nausea and vomiting 11/24/2016   Encounter for general adult medical examination with abnormal findings 09/25/2016   Opioid-induced constipation (OIC) 05/14/2016   Chronic pain syndrome 02/13/2016   Chronic shoulder radicular pain (Left) 12/18/2015   Chronic cervical radicular pain (Left) 12/18/2015   Disturbance of skin sensation 11/13/2015   Anxiety and depression 07/17/2015   Allergic rhinitis 05/22/2015   History of Vasovagal response to spinal injections 04/30/2015    Class: History of   BPH (benign prostatic hyperplasia) 04/17/2015   Diabetes ([place]) 03/19/2015   Chronic shoulder pain (Bilateral) (R>L) 03/06/2015   Occipital neuralgia (Left) 03/06/2015   Cervicogenic headache (Left) 03/06/2015   Chronic low back pain (Bilateral) (R>L) w/ sciatica (Bilateral) 12/05/2014   Lumbar facet syndrome (Bilateral) (R>L) 12/05/2014   Lumbar spondylosis 12/05/2014   Diabetic peripheral neuropathy ([place]) 12/05/2014   Long term current use of opiate analgesic 12/05/2014   Long term prescription opiate use 12/05/2014   Opiate use (60 MME/Day) 12/05/2014    Opiate dependence ([place]) 12/05/2014   Encounter for therapeutic drug level monitoring 12/05/2014   Chronic neck pain (midline over the C7 spinous processes) (L>R) 12/05/2014   Neurogenic pain 12/05/2014   Neuropathic pain 12/05/2014   Contrast dye allergy 12/05/2014   Chronic lower extremity pain (2ry area of Pain) (Bilateral) (R>L) 12/05/2014   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) (Right L5 dermatome) 12/05/2014   History of total hip replacement (Right) 12/05/2014   Chronic hip pain (Right) 12/05/2014   Class I Morbid obesity ([place]) (68% higher incidence of chronic low back pain) 12/05/2014   Essential hypertension 12/05/2014   GERD (gastroesophageal reflux disease) 12/05/2014   Obstructive sleep apnea 12/05/2014   Hyperlipidemia 12/05/2014   Chronic kidney disease (CKD) 12/05/2014   Abnormal nerve conduction studies (severe bilateral lower extremity polyneuropathy) 12/05/2014    Immunization History  Administered Date(s) Administered   Fluad Quad(high Dose 65+) 10/26/2018   Influenza, High Dose Seasonal PF 11/21/2015, 10/27/2016, 11/06/2017, 11/24/2019   Moderna Sars-Covid-2 Vaccination 03/23/2019, 04/20/2019, 11/24/2019, 05/02/2020   Pneumococcal Conjugate-13 11/21/2015   Pneumococcal Polysaccharide-23 10/21/2017   Tdap 04/09/2015    Conditions to be addressed/monitored: HTN, HLD, and DMII  Care Plan : PharmD Medication Management  Updates made by De Hollingshead, RPH-CPP since 11/12/2020 12:00 AM     Problem: Diabetes, Hypertension, Hyperlipidemia      Long-Range Goal: Disease Prevention Progression   Start Date: 05/24/2020  This Visit's Progress: On track  Recent Progress: On track  Priority: High  Note:   Current Barriers:  Unable to achieve control of diabetes    Pharmacist Clinical Goal(s):  Over the next 90 days, patient will achieve control of diabetes as  evidenced by A1c through collaboration with PharmD and provider.     Interventions: 1:1 collaboration with [name], MD regarding development and update of comprehensive plan of care as evidenced by provider attestation and co-signature Inter-disciplinary care team collaboration (see longitudinal plan of care) Comprehensive medication review performed; medication list updated in electronic medical record   Health Maintenance   Yearly diabetic eye exam: due  - scheduled this afternoon Yearly diabetic foot exam: up to date Urine microalbumin: up to date Yearly influenza vaccination: due - recommended to receive. [facility]  Td/Tdap vaccination: up to date Pneumonia vaccination: up to date COVID vaccinations: up to date - recommended to receive, [facility] Shingrix vaccinations: due - recommended to receive moving forward Colonoscopy: due - discuss w/ PCP at next appointment  Medication Management: Receives his medications [facility] in [facility].   Diabetes: Uncontrolled, but improved; current treatment: metformin 1000 mg BID, Jardiance 25 mg daily, Ozempic 2 mg weekly, Tresiba 12 units daily, Humalog 7 units with meals- says he often forgets to take before meals. Denies GI upset with increased Ozempic dose Current glucose readings: Using Libre 14 day Date of Download: 9/28-10/11/22 % Time CGM is active: 88% Average Glucose: 152 mg/dL Glucose Management Indicator: 6.9  Glucose Variability: 20.2 (goal <36%) Time in Goal:  - Time in range 70-180: 82% - Time above range: 18% - Time below range: 0% Observed patterns: post prandial elevations Discussed glycemic patterns. Increase Humalot to 9 units with meals. Continue Tresiba 12 units daily, metformin 1000 mg twice daily, Jardiance 25 mg daily, and Ozempic 2 mg weekly at this time  Hypertension: Controlled per last office visit; current treatment: carvedilol 25 mg BID, HCTZ 25 mg daily, olmesartan 40 mg daily; Previously recommended to continue current regimen at  this time.    Hyperlipidemia: Controlled per last lipid panel; current treatment: pravastatin 40 mg daily Antiplatelet regimen: aspirin 81 mg daily  Previously recommended to continue current regimen at this time   Depression/Anxiety/Chronic Pain: Uncontrolled pain but moderate mood; current treatment: escitalopram 20 mg daily, alprazolam 0.5 mg BID PRN; morphine ER 30 mg BID; acetaminophen 500 mg PRN, gabapentin 300 mg QPM (300 mg/6 mL); reports he has used ibuprofen PRN, generally no more than once weekly, but has not been taking meloxicam or celecoxib; follows w/ Pain Management Dr. Consuela Mimes  Constipation: Amitiza 8 mcg BID PRN; senokot-S BID PRN Advised to stick with a single NSAID agent and continue use sparingly. Renal function has been stable recently.  Continue collaboration with PCP, pain management, orthopedics.   GERD: Appropriately managed; current regimen: pantoprazole 40 mg daily Previously recommended to continue current regimen at this time  BPH: Appropriately managed; current regimen: tamsulosin 0.4 mg BID Previously recommended to continue current regimen at this time  Patient Goals/Self-Care Activities Over the next 90 days, patient will:  - take medications as prescribed check glucose at least three times daily using CGM, document, and provide at future appointments check blood pressure periodically, document, and provide at future appointments collaborate with provider on medication access solutions   Follow Up Plan: Telephone follow up appointment with care management team member scheduled for:  ~ 12 weeks     Medication Assistance: None required.  Patient affirms current coverage meets needs.  [facility]  [id number] [place] [place]Phone: [phone]: [phone] [place]  [address] AT [facility] [address] [place] [address] Phone: [phone] Fax: [phone]   Follow Up:  Patient agrees to Care Plan and Follow-up.   Plan: Telephone follow up appointment with care management team member scheduled for:  12 weeks  [name], PharmD, [place], [place] Clinical Pharmacist [employer] [facility] [employer] [phone]

## 2020-11-12 NOTE — Telephone Encounter (Signed)
Referral placed. Please communicate with [place] to have this sent today.

## 2020-11-12 NOTE — Telephone Encounter (Signed)
[facility] calling in and states they saw the Patient today. Needing a referral placed with today's date as this was a diabetic eye exam and his insurance will need the referral so that they can file insurance.    This will need to be sent to: [facility] in [place].    They were hoping this could be completed today, Please advise   Sherill Mangen,cma

## 2020-11-12 NOTE — Patient Instructions (Signed)
Alan Schmidt,   Keep up the great work!  Increase Humalog to 9 units with meals. Continue Tresiba 12 units daily, metformin 1000 mg twice daily, Jardiance 25 mg daily, and Ozempic 2 mg weekly.   We recommend you get the influenza vaccine for this season.   We recommend you get the updated bivalent COVID-19 booster, at least 2 months after any prior doses. You may consider delaying a booster dose by 3 months from a prior episode of COVID-19 per the CDC.   You can find pharmacies that have this formulation in stock at [email or web address].   Take care!  [name], PharmD  Visit Information  PATIENT GOALS:  Goals Addressed               This Visit's Progress     Patient Stated     Medication Monitoring (pt-stated)        Patient Goals/Self-Care Activities Over the next 90 days, patient will:  - take medications as prescribed check glucose at least three times daily using CGM, document, and provide at future appointments check blood pressure periodically, document, and provide at future appointments collaborate with provider on medication access solutions         Patient verbalizes understanding of instructions provided today and agrees to view in [place].    Plan: Telephone follow up appointment with care management team member scheduled for:  12 weeks  [name], PharmD, [place], [place] Clinical Pharmacist [employer] at [employer] [phone]

## 2020-11-12 NOTE — Telephone Encounter (Signed)
[facility] calling in and states they saw the Patient today. Needing a referral placed with today's date as this was a diabetic eye exam and his insurance will need the referral so that they can file insurance.   This will need to be sent to: [facility] in [place].   They were hoping this could be completed today, Please advise

## 2020-11-13 NOTE — Telephone Encounter (Signed)
Patient calling back in and states he called and spoke with [facility]. They then sent the Patient a letter dated 10/23/20 denying the extra hours due to not getting a letter from Patient's PCP.   Please advise    Needing description of change in condition and need for services.

## 2020-11-15 NOTE — Telephone Encounter (Signed)
LVM for patient to cal back to let him know we are working on his form for more services.

## 2020-11-15 NOTE — Telephone Encounter (Signed)
Patient calling back in and states he called and spoke with [facility]. They then sent the Patient a letter dated 10/23/20 denying the extra hours due to not getting a letter from Patient's PCP.    Please advise     Needing description of change in condition and need for services.    I called the nurse at [facility] and she stated he does not have many hours and he needs more and in Box 4 of the form if you could write a note or initial another box on line 4 he can get more services.  I put the form in the sing basket for you to initial.  Stein Windhorst,cma

## 2020-11-17 ENCOUNTER — Other Ambulatory Visit: Payer: Self-pay | Admitting: Family Medicine

## 2020-11-17 DIAGNOSIS — E1142 Type 2 diabetes mellitus with diabetic polyneuropathy: Secondary | ICD-10-CM

## 2020-11-19 NOTE — Telephone Encounter (Signed)
Explanation added. Please fax back.

## 2020-11-19 NOTE — Telephone Encounter (Signed)
Faxed to [facility].  Confirmation given.  Gardiner Espana,cma

## 2020-11-21 DIAGNOSIS — E118 Type 2 diabetes mellitus with unspecified complications: Secondary | ICD-10-CM | POA: Diagnosis not present

## 2020-11-21 DIAGNOSIS — Z794 Long term (current) use of insulin: Secondary | ICD-10-CM | POA: Diagnosis not present

## 2020-11-27 ENCOUNTER — Other Ambulatory Visit: Payer: Self-pay | Admitting: Family Medicine

## 2020-11-29 ENCOUNTER — Telehealth: Payer: Self-pay | Admitting: Family Medicine

## 2020-11-29 NOTE — Telephone Encounter (Signed)
Called requesting office note from [place] 09/30/20 to approve diabetic supplies . Fax # [phone]

## 2020-12-02 DIAGNOSIS — E785 Hyperlipidemia, unspecified: Secondary | ICD-10-CM

## 2020-12-02 DIAGNOSIS — E1142 Type 2 diabetes mellitus with diabetic polyneuropathy: Secondary | ICD-10-CM | POA: Diagnosis not present

## 2020-12-02 DIAGNOSIS — Z794 Long term (current) use of insulin: Secondary | ICD-10-CM | POA: Diagnosis not present

## 2020-12-02 DIAGNOSIS — I10 Essential (primary) hypertension: Secondary | ICD-10-CM | POA: Diagnosis not present

## 2020-12-03 ENCOUNTER — Ambulatory Visit: Payer: Medicare Other

## 2020-12-03 DIAGNOSIS — M545 Low back pain, unspecified: Secondary | ICD-10-CM | POA: Diagnosis not present

## 2020-12-03 DIAGNOSIS — M4692 Unspecified inflammatory spondylopathy, cervical region: Secondary | ICD-10-CM | POA: Diagnosis not present

## 2020-12-03 DIAGNOSIS — M5442 Lumbago with sciatica, left side: Secondary | ICD-10-CM | POA: Diagnosis not present

## 2020-12-03 DIAGNOSIS — M5441 Lumbago with sciatica, right side: Secondary | ICD-10-CM | POA: Diagnosis not present

## 2020-12-04 DIAGNOSIS — M5416 Radiculopathy, lumbar region: Secondary | ICD-10-CM | POA: Diagnosis not present

## 2020-12-06 NOTE — Telephone Encounter (Signed)
Faxed to [phone]. Confirmation given .  Kenadee Gates,cma

## 2020-12-10 ENCOUNTER — Other Ambulatory Visit: Payer: Self-pay | Admitting: *Deleted

## 2020-12-10 NOTE — Patient Outreach (Signed)
[facility]) Care Management  12/10/2020  Alan Schmidt [birth year] [id number]  Unsuccessful outreach attempt made to patient. RN Health Coach left HIPAA compliant voicemail message along with her contact information.  Plan: RN Health Coach will call patient within the month of November.  [name] RN, BSN [place] [phone] [email or web address]

## 2020-12-13 ENCOUNTER — Ambulatory Visit: Payer: Self-pay | Admitting: *Deleted

## 2020-12-22 DIAGNOSIS — E118 Type 2 diabetes mellitus with unspecified complications: Secondary | ICD-10-CM | POA: Diagnosis not present

## 2020-12-22 DIAGNOSIS — Z794 Long term (current) use of insulin: Secondary | ICD-10-CM | POA: Diagnosis not present

## 2020-12-25 ENCOUNTER — Other Ambulatory Visit: Payer: Self-pay | Admitting: *Deleted

## 2020-12-25 NOTE — Patient Instructions (Addendum)
Visit Information  Thank you for taking time to visit with me today. Please don't hesitate to contact me if I can be of assistance to you before our next scheduled telephone appointment.  Following are the goals we discussed today:   Patient Goals/Self-Care Activities: schedule appointment with eye doctor check blood sugar at prescribed times: before meals and at bedtime check feet daily for cuts, sores or redness take the blood sugar meter to all doctor visits drink 6 to 8 glasses of water each day read food labels for fat, fiber, carbohydrates and portion size Use a large mirror to assist with looking at your feet daily; ask your aide for assistance Monitor your blood sugar using Freestyle Libre frequently, limit sugar and carbohydrates, and contact Dr. [name] for consistently high blood sugars >200 especially when taking steroids to control your pain  The patient verbalized understanding of instructions, educational materials, and care plan provided today and agreed to receive a mailed copy of patient instructions, educational materials, and care plan.   Telephone follow up appointment with care management team member scheduled [email or web address]  [name] RN, [place] [phone] [email or web address]

## 2020-12-25 NOTE — Patient Outreach (Signed)
[facility]) Care Management  12/25/2020  [name] 1945/06/28 [id number]  [facility]) Care Management RN Health Coach Note   12/25/2020 Name:  Alan Schmidt MRN:  [id number] DOB:  05-19-1945  Summary: Patient reports that he is doing fairly well. He continues to have an aide come and assist him 2 days a week which has been very beneficial. Patient shares that his blood sugar ranges have been higher than usual due to taking steroids to help to control his chronic pain. His ranges are 140-256 and patient's last A1c was 8.5 on 09/30/20. Patient states that his home environment is safe and that he does have all of his essential needs at this time.  Recommendations/Changes made from today's visit: Use a large mirror to assist with looking at your feet daily; ask your aide for assistance Monitor your blood sugar using Freestyle Libre frequently, limit sugar and carbohydrates, and contact Dr. Caryl Schmidt for consistently high blood sugars >200 especially when taking steroids to control your pain   Subjective: Alan Schmidt is an 75 y.o. year old male who is a primary patient of Alan Schmidt, Alan Adam, MD. The care management team was consulted for assistance with care management and/or care coordination needs.    RN Health Coach completed Telephone Visit today.   Objective:  Medications Reviewed Today     Reviewed by Michiel Cowboy, RN (Registered Nurse) on 12/25/20 at 74  Med List Status: <None>   Medication Order Taking? Sig Documenting Provider Last Dose Status Informant  ACCU-CHEK GUIDE test strip [id number]  USE AS INSTRUCTED DX CODE: E11.9 [name], MD  Active   acetaminophen (TYLENOL) 500 MG tablet [id number] No Take 500 mg by mouth every 6 (six) hours as needed. [provider] Taking Active            Med Note Darnelle Maffucci, Arville Lime   Wed Sep 11, 2020  1:21 PM)    ALPRAZolam Duanne Moron) 1 MG tablet [id number]  TAKE 0.5  TABLETS BY MOUTH 2 TIMES DAILY AS NEEDED FOR ANXIETY. [name], MD  Active   aspirin EC 81 MG tablet [id number] No Take 81 mg by mouth daily. [provider] Taking Active   buPROPion (WELLBUTRIN XL) 150 MG 24 hr tablet [id number] No TAKE 1 TABLET (150 MG TOTAL) BY MOUTH DAILY FOR 7 DAYS, THEN 2 TABLETS (300 MG TOTAL) DAILY. [name], MD Taking Active   carvedilol (COREG) 25 MG tablet [id number] No TAKE 1 TABLET BY MOUTH TWICE A DAY Alan Schmidt Alan Adam, MD Taking Active   Continuous Blood Gluc Receiver (FREESTYLE LIBRE 14 DAY READER) [place] [id number] No 1 Device by Does not apply route daily. Use to scan to check blood sugar up to 7 times daily; E11.42, [name], MD Taking Active   Continuous Blood Gluc Sensor (FREESTYLE LIBRE 14 DAY SENSOR) [place] [id number] No 1 Device by Does not apply route every 14 (fourteen) days. E11.42 [name], MD Taking Active   desoximetasone (TOPICORT) 0.25 % cream [id number] No APPLY CREAM TO AFFECTED AREA TWO TIMES DAILY, FOR UP TO 7 DAYS, DO NOT APPLY TO Benita Stabile, MD Taking Active   empagliflozin (JARDIANCE) 25 MG TABS tablet [id number]  Take 1 tablet (25 mg total) by mouth daily. [name], MD  Active   EPINEPHrine (EPIPEN 2-PAK) 0.3 mg/0.3 mL IJ SOAJ injection [id number] No Inject 0.3 mg into the muscle as needed for anaphylaxis. [name],  MD Taking Active   escitalopram (LEXAPRO) 20 MG tablet [id number] No TAKE 1 TABLET BY MOUTH EVERY DAY [name], MD Taking Active   gabapentin (NEURONTIN) 250 MG/5ML solution [id number]  TAKE 6 MLS (300 MG TOTAL) BY MOUTH AT BEDTIME. [name], MD  Active   hydrochlorothiazide (HYDRODIURIL) 25 MG tablet [id number] No TAKE 1 TABLET BY MOUTH EVERY DAY [name], MD Taking Active   insulin degludec (TRESIBA FLEXTOUCH) 100 UNIT/ML FlexTouch Pen [id number]  Inject 12 Units into the skin daily. [name], MD  Active   insulin lispro  (HUMALOG KWIKPEN) 100 UNIT/ML KwikPen [id number]  Inject 9 Units into the skin 3 (three) times daily. [name], MD  Active   Insulin Pen Needle (BD PEN NEEDLE NANO U/F) 32G X 4 MM MISC [id number] No USE EVERY DAY [name], MD Taking Active   loratadine (CLARITIN) 10 MG tablet [id number] No TAKE 1 TABLET BY MOUTH EVERY DAY [name], MD Taking Active   lubiprostone (AMITIZA) 8 MCG capsule [id number] No Take 1 capsule (8 mcg total) by mouth 2 (two) times daily with a meal. Swallow the medication whole. Do not break or chew the medication. Milinda Pointer, MD Taking Active            Med Note Darnelle Maffucci, Arville Lime   Wed Sep 11, 2020  1:24 PM) Taking PRN  metFORMIN (GLUCOPHAGE) 1000 MG tablet [id number]  Take 1 tablet (1,000 mg total) by mouth 2 (two) times daily with a meal. [name], MD  Active   methocarbamol (ROBAXIN) 500 MG tablet [id number] No Take 500 mg by mouth 2 (two) times daily. [provider] Taking Active   mometasone (NASONEX) 50 MCG/ACT nasal spray [id number] No USE 2 SPRAYS INTO EACH NOSTRIL ONCE DAILY [name], MD Taking Active   morphine (MS CONTIN) 30 MG 12 hr tablet [id number] No Take 1 tablet (30 mg total) by mouth every 12 (twelve) hours. Must last 30 days. Do not break tablet Milinda Pointer, MD Taking Expired 11/21/20 2359   morphine (MS CONTIN) 30 MG 12 hr tablet [id number] No Take 1 tablet (30 mg total) by mouth every 12 (twelve) hours. Must last 30 days. Do not break tablet Milinda Pointer, MD Taking Expired 12/21/20 2359   morphine (MS CONTIN) 30 MG 12 hr tablet [id number] No Take 1 tablet (30 mg total) by mouth every 12 (twelve) hours. Must last 30 days. Do not break tablet Milinda Pointer, MD Taking Active            Med Note Dossie Arbour, [place] A   Wed Oct 16, 2020  9:46 AM) WARNING: Not a Duplicate. Future prescription. DO NOT DELETE during hospital medication reconciliation or at discharge. [facility] Chronic Pain  Management Patient   naloxone (NARCAN) 2 MG/2ML injection [id number] No Inject 1 mL (1 mg total) into the muscle as needed for up to 2 doses (for emergency use only). Always have available. Inject into thigh muscle and call [place] Milinda Pointer, MD Taking Active   olmesartan (BENICAR) 40 MG tablet [id number] No TAKE 1 TABLET BY MOUTH EVERY DAY [name], MD Taking Active   pantoprazole (PROTONIX) 40 MG tablet [id number] No TAKE 1 TABLET BY MOUTH EVERY DAY [name], MD Taking Active   pravastatin (PRAVACHOL) 40 MG tablet [id number] No TAKE 1 TABLET BY MOUTH EVERY DAY [name], MD Taking Active   Semaglutide, 2 MG/DOSE, (OZEMPIC, 2 MG/DOSE,) 8 MG/3ML SOPN [id number]  No Inject 2 mg into the skin once a week. [name], MD Taking Active   sennosides-docusate sodium (SENOKOT-S) 8.6-50 MG tablet [id number] No Take 1 tablet by mouth daily as needed.  [provider] Taking Active   tamsulosin (FLOMAX) 0.4 MG CAPS capsule [id number] No TAKE 1 CAPSULE BY MOUTH 2 TIMES DAILY. [name], MD Taking Active   Wheat Dextrin [facility]) POWD [id number] No Take 6 g by mouth 3 (three) times daily before meals. (2 tsp = 6 g) Milinda Pointer, MD Taking Expired 10/16/20 2359   Med List Note Janett Billow, RN 10/16/20 1008): UDS 10/16/20 MR 01/20/21             SDOH:  (Social Determinants of Health) assessments and interventions performed: SDOH assessments completed today and documented in the Epic system.    Care Plan  Review of patient past medical history, allergies, medications, health status, including review of consultants reports, laboratory and other test data, was performed as part of comprehensive evaluation for care management services.   Care Plan : Diabetes Type 2 (Adult)  Updates made by Michiel Cowboy, RN since 12/25/2020 12:00 AM     Problem: Glycemic Management (Diabetes, Type 2) Resolved 12/25/2020  Priority: High      Long-Range Goal: Glycemic Management Optimized Completed 12/25/2020  Start Date: 05/07/2020  Expected End Date: 06/01/2021  Note:   Resolving due to duplicate goal  Evidence-based guidance:  Anticipate A1C testing (point-of-care) every 3 to 6 months based on goal attainment.  Review mutually-set A1C goal or target range.  Anticipate use of antihyperglycemic with or without insulin and periodic adjustments; consider active involvement of pharmacist.  Provide medical nutrition therapy and development of individualized eating.  Compare self-reported symptoms of hypo or hyperglycemia to blood glucose levels, diet and fluid intake, current medications, psychosocial and physiologic stressors, change in activity and barriers to care adherence.  Promote self-monitoring of blood glucose levels.  Assess and address barriers to management plan, such as food insecurity, age, developmental ability, depression, anxiety, fear of hypoglycemia or weight gain, as well as medication cost, side effects and complicated regimen.  Consider referral to community-based diabetes education program, visiting nurse, community health worker or health coach.  Encourage regular dental care for treatment of periodontal disease; refer to dental provider when needed.   Notes:     Task: Alleviate Barriers to Glycemic Management Completed 12/25/2020  Due Date: 06/01/2021  Note:   Care Management Activities:    - barriers to adherence to treatment plan identified - blood glucose monitoring encouraged - dental care encouraged - individualized medical nutrition therapy provided - self-awareness of signs/symptoms of hypo or hyperglycemia encouraged - use of blood glucose monitoring log promoted    Notes:     Problem: Disease Progression (Diabetes, Type 2) Resolved 12/25/2020  Priority: High     Goal: Disease Progression Prevented or Minimized Completed 12/25/2020  Start Date: 05/07/2020  Expected End Date: 06/01/2021   Note:   Resolving due to duplicate goal  Evidence-based guidance:  Prepare patient for laboratory and diagnostic exams based on risk and presentation.  Encourage lifestyle changes, such as increased intake of plant-based foods, stress reduction, consistent physical activity and smoking cessation to prevent long-term complications and chronic disease.   Individualize activity and exercise recommendations while considering potential limitations, such as neuropathy, retinopathy or the ability to prevent hyperglycemia or hypoglycemia.   Prepare patient for use of pharmacologic therapy that may include antihypertensive, analgesic, prostaglandin E1 with  periodic adjustments, based on presenting chronic condition and laboratory results.  Assess signs/symptoms and risk factors for hypertension, sleep-disordered breathing, neuropathy (including changes in gait and balance), retinopathy, nephropathy and sexual dysfunction.  Address pregnancy planning and contraceptive choice, especially when prescribing antihypertensive or statin.  Ensure completion of annual comprehensive foot exam and dilated eye exam.   Implement additional individualized goals and interventions based on identified risk factors.  Prepare patient for consultation or referral for specialist care, such as ophthalmology, neurology, cardiology, podiatry, nephrology or perinatology.   Notes:     Task: Monitor and Manage Follow-up for Comorbidities Completed 12/25/2020  Due Date: 06/01/2021  Note:   Care Management Activities:    - completion of annual dilated eye exam confirmed - completion of annual foot exam verified - healthy lifestyle promoted - reduction of sedentary activity encouraged - signs/symptoms of comorbidities identified    Notes:     Care Plan : [place] of Care  Updates made by Michiel Cowboy, RN since 12/25/2020 12:00 AM     Problem: Knowledge Deficit Related to Diabetes   Priority: High      Long-Range Goal: Development of Plan of Care for Management of Diabetes   Start Date: 12/25/2020  Expected End Date: 01/01/2022  Priority: High  Note:   Current Barriers:  Knowledge Deficits related to plan of care for management of DMII   RNCM Clinical Goal(s):  Patient will verbalize understanding of plan for management of DMII as evidenced by continuation of monitoring blood sugar 2-3 times daily, adhering to diabetic diet especially when taking steroids to control pain take all medications exactly as prescribed and will call provider for medication related questions as evidenced by patients verbalization of taking his medication as written and contacting provider for any questions or concerns    demonstrate improved adherence to prescribed treatment plan for DMII as evidenced by monitoring blood sugar closely, and adhering to a strict diabetic diet when taking steroids  continue to work with Consulting civil engineer and/or Social Worker to address care management and care coordination needs related to DMII as evidenced by adherence to CM Team Scheduled appointments     through collaboration with Consulting civil engineer, provider, and care team.   Interventions: Inter-disciplinary care team collaboration (see longitudinal plan of care) Evaluation of current treatment plan related to  self management and patient's adherence to plan as established by provider Discussed importance of monitoring blood sugar via CGM frequently, adhering to diabetic diet, and contacting provider for consistent high blood sugar values when taking steroids to control his pain Discussed importance of diabetic foot care; encouraged patient to use a mirror to look at feet and to ask his aide to assist   Patient Goals/Self-Care Activities: schedule appointment with eye doctor check blood sugar at prescribed times: before meals and at bedtime check feet daily for cuts, sores or redness take the blood sugar meter to all doctor  visits drink 6 to 8 glasses of water each day read food labels for fat, fiber, carbohydrates and portion size Use a large mirror to assist with looking at your feet daily; ask your aide for assistance Monitor your blood sugar using Freestyle Libre frequently, limit sugar and carbohydrates, and contact Dr. Caryl Schmidt for consistently high blood sugars >200 especially when taking steroids to control your pain          Plan: Telephone follow up appointment with care management team member scheduled for:  February. Nurse will send PCP today's  assessment note.  [name] RN, [place] [phone] [email or web address]

## 2020-12-27 ENCOUNTER — Other Ambulatory Visit: Payer: Self-pay | Admitting: Family Medicine

## 2020-12-27 NOTE — Telephone Encounter (Signed)
LOV: 09/30/20 NOV: 01/07/21

## 2021-01-01 ENCOUNTER — Ambulatory Visit: Payer: Medicare Other | Admitting: Family Medicine

## 2021-01-07 ENCOUNTER — Ambulatory Visit: Payer: Medicare Other | Admitting: Family Medicine

## 2021-01-15 ENCOUNTER — Encounter: Payer: Medicare Other | Admitting: Pain Medicine

## 2021-01-15 ENCOUNTER — Other Ambulatory Visit: Payer: Self-pay | Admitting: Pain Medicine

## 2021-01-15 NOTE — Progress Notes (Deleted)
No show to appointment.

## 2021-01-15 NOTE — Progress Notes (Signed)
(  01/15/2021) No-show to appointment.   According to our notes the patient's last set of prescriptions was written by me on his last encounter on 10/16/2020.   According to the patient's PMP, his last prescription filled was from 07/24/2020.  A prescription which upon reviewing the chart had apparently not been filled.  However it is kind of odd that that 2/3 of the prescriptions written on 10/16/2020 were actually filled before the 3/3 prescription written on 07/24/2020. This means that he still has 1 more prescription from 10/16/2020 that has not been filled.  Since his last prescription was filled on 12/28/2020, this last prescription from 10/16/2020 should be filled on 01/27/2021 and it should last until 02/26/2021.

## 2021-01-16 ENCOUNTER — Telehealth: Payer: Self-pay | Admitting: Family Medicine

## 2021-01-16 NOTE — Telephone Encounter (Signed)
Alan Schmidt from [facility] called in regards to Pt medication (Continuous Blood Gluc Sensor (FREESTYLE LIBRE 14 DAY SENSOR) MISC). Alan Schmidt stated that there office faxed over paperwork to our office and they didn't get a response back from provider. [name] is requesting callback or fax. Callback number is [phone] and fax [phone].

## 2021-01-17 NOTE — Progress Notes (Signed)
PROVIDER NOTE: Information contained herein reflects review and annotations entered in association with encounter. Interpretation of such information and data should be left to medically-trained personnel. Information provided to patient can be located elsewhere in the medical record under "Patient Instructions". Document created using STT-dictation technology, any transcriptional errors that may result from process are unintentional.    Patient: Alan Schmidt  Service Category: E/M  Provider: Gaspar Cola, MD  DOB: April 07, 1945  DOS: 01/20/2021  Specialty: Interventional Pain Management  MRN: [id number]  Setting: Ambulatory outpatient  PCP: [name], MD  Type: Established Patient    Referring Provider: [name], MD  Location: Office  Delivery: Face-to-face     HPI  [name], a 75 y.o. year old male, is here today because of his No primary diagnosis found.. Mr. Edmonds primary complain today is Back Pain (lower) Last encounter: My last encounter with him was on 01/15/2021. Pertinent problems: Mr. Keeter has Chronic low back pain (Bilateral) (R>L) w/ sciatica (Bilateral); Lumbar facet syndrome (Bilateral) (R>L); Lumbar spondylosis; Diabetic peripheral neuropathy ([place]); Chronic neck pain (midline over the C7 spinous processes) (L>R); Neurogenic pain; Neuropathic pain; Chronic lower extremity pain (2ry area of Pain) (Bilateral) (R>L); 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) (Right L5 dermatome); History of total hip replacement (Right); Chronic hip pain (Right); Abnormal nerve conduction studies (severe bilateral lower extremity polyneuropathy); Chronic shoulder pain (Bilateral) (R>L); Occipital neuralgia (Left); Cervicogenic headache (Left); Chronic shoulder radicular pain (Left); Chronic cervical radicular pain (Left); Chronic pain syndrome; Lumbar facet joint osteoarthritis (Bilateral); Chronic headache; Chronic tension-type headache, intractable;  Coccygodynia; DDD (degenerative disc disease), lumbar; Chronic musculoskeletal pain; Chronic knee pain (Bilateral); Thumb pain (Right); 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 Pain Assessment: Severity of Chronic pain is reported as a 2 /10. Location: Back Left, Right/Denies. Onset: More than a month ago. Quality: Aching, Burning, Constant. Timing: Constant. Modifying factor(s): meds and resting. Vitals:  height is 6' 1" (1.854 m) and weight is 232 lb (105.2 kg). His temperature is 97 F (36.1 C) (abnormal). His blood pressure is 131/85 and his pulse is 93. His respiration is 17 and oxygen saturation is 100%.   Reason for encounter: medication management.  According to our notes the patient's last set of prescriptions was written by me on his last encounter on 10/16/2020. According to his PMP, his last prescription filled was from 07/24/2020.  A prescription which upon reviewing the chart had apparently not been filled.  However it is kind of odd that that 2/3 of the prescriptions written on 10/16/2020 were actually filled before the 3/3 prescription written on 07/24/2020. This means that he still has 1 more prescription from 10/16/2020 that has not been filled.  Since his last prescription was filled on 12/28/2020, this last prescription from 10/16/2020 should be filled on 01/27/2021 and it should last until 02/26/2021.  RTCB: 04/27/2021 Nonopioids transferred 12/14/2019: Gabapentin, Amitiza, Benefiber, Mobic  Pharmacotherapy Assessment  Analgesic: Morphine ER (MS Contin) 30 mg, 1 tab p.o. twice daily (60 mg/day).  (Previously on oxycodone IR 10 mg every 6 hours (40 mg/day)) MME/day: 60 mg/day.    Monitoring: [place] PMP: PDMP reviewed during this encounter.       Pharmacotherapy: No side-effects or adverse reactions reported. Compliance: No problems  identified. Effectiveness: Clinically acceptable.  Chauncey Fischer, RN  01/20/2021  2:43 PM  Sign when Signing Visit Nursing Pain Medication Assessment:  Safety precautions to be maintained throughout the outpatient stay will include: orient to surroundings, keep bed in low position, maintain call bell within reach at all times, provide assistance with transfer out of bed and ambulation.  Medication Inspection Compliance: Pill count conducted under aseptic conditions, in front of the patient. Neither the pills nor the bottle was removed from the patient's sight at any time. Once count was completed pills were immediately returned to the patient in their original bottle.  Medication: Morphine ER (MSContin) Pill/Patch Count:  21 of 60 pills remain Pill/Patch Appearance: Markings consistent with prescribed medication Bottle Appearance: Standard pharmacy container. Clearly labeled. Filled Date: 64 / 26 / 2022 Last Medication intake:  TodaySafety precautions to be maintained throughout the outpatient stay will include: orient to surroundings, keep bed in low position, maintain call bell within reach at all times, provide assistance with transfer out of bed and ambulation.     UDS:  Summary  Date Value Ref Range Status  10/16/2020 Note  Final    Comment:    ==================================================================== [employer] [facility] 13 (MW) ==================================================================== Test                             Result       Flag       Units  Drug Present and Declared for Prescription Verification   Alprazolam                     86           EXPECTED   ng/mg creat   Alpha-hydroxyalprazolam        110          EXPECTED   ng/mg creat    Source of alprazolam is a scheduled prescription medication.  Alpha-    hydroxyalprazolam is an expected metabolite of alprazolam.    Morphine                       >11905       EXPECTED   ng/mg creat   Normorphine                    571          EXPECTED   ng/mg creat    Potential sources of large amounts of morphine in the absence of    codeine include administration of morphine or use of heroin.     Normorphine is an expected metabolite of morphine.    Hydromorphone                  237          EXPECTED   ng/mg creat    Hydromorphone may be present as a metabolite of morphine;    concentrations of hydromorphone rarely exceed 5% of the morphine    concentration when this is the source of hydromorphone.  ==================================================================== Test                      Result    Flag   Units      Ref Range   Creatinine              84  mg/dL      >=20 ==================================================================== Declared Medications:  The flagging and interpretation on this report are based on the  following declared medications.  Unexpected results may arise from  inaccuracies in the declared medications.   **Note: The testing scope of this panel includes these medications:   Alprazolam (Xanax)  Morphine (MS Contin)   **Note: The testing scope of this panel does not include the  following reported medications:   Acetaminophen (Tylenol)  Aspirin  Bupropion (Wellbutrin XL)  Carvedilol (Coreg)  Docusate  Empagliflozin (Jardiance)  Epinephrine (EpiPen)  Escitalopram (Lexapro)  Gabapentin (Neurontin)  Hydrochlorothiazide  Insulin Tyler Aas)  Loratadine (Claritin)  Lubiprostone (Amitiza)  Metformin (Glucophage)  Methocarbamol (Robaxin)  Mometasone (Nasonex)  Naloxone (Narcan)  Olmesartan (Benicar)  Pantoprazole (Protonix)  Pravastatin (Pravachol)  Semaglutide (Ozempic)  Sennosides  Supplement (Fiber)  Tamsulosin (Flomax)   Topical ==================================================================== For clinical consultation, please call [phone]. ====================================================================      ROS  Constitutional: Denies any fever or chills Gastrointestinal: No reported hemesis, hematochezia, vomiting, or acute GI distress Musculoskeletal: Denies any acute onset joint swelling, redness, loss of ROM, or weakness Neurological: No reported episodes of acute onset apraxia, aphasia, dysarthria, agnosia, amnesia, paralysis, loss of coordination, or loss of consciousness  Medication Review  ALPRAZolam, Benefiber, EPINEPHrine, FreeStyle Libre 14 Day Reader, FreeStyle Libre 14 Day Sensor, Insulin Pen Needle, Semaglutide (2 MG/DOSE), acetaminophen, aspirin EC, buPROPion, carvedilol, desoximetasone, empagliflozin, escitalopram, gabapentin, glucose blood, hydrochlorothiazide, insulin degludec, insulin lispro, loratadine, lubiprostone, metFORMIN, methocarbamol, mometasone, morphine, naloxone, olmesartan, pantoprazole, pravastatin, sennosides-docusate sodium, and tamsulosin  History Review  Allergy: Mr. Buczynski is allergic to contrast media [iodinated diagnostic agents], iodine, and shellfish allergy. Drug: Mr. Habig  reports no history of drug use. Alcohol:  reports no history of alcohol use. Tobacco:  reports that he has quit smoking. His smoking use included cigarettes. He has never used smokeless tobacco. Social: Mr. Tutson  reports that he has quit smoking. His smoking use included cigarettes. He has never used smokeless tobacco. He reports that he does not drink alcohol and does not use drugs. Medical:  has a past medical history of Acute postoperative pain (11/24/2016), Anxiety, Chronic hip pain (Right) (12/05/2014), Chronic lumbar pain, Depression, Diabetes mellitus without complication ([place]), Hyperlipidemia, Hypertension, Kidney stones, and Migraines. Surgical: Mr. Akhtar  has a  past surgical history that includes Total hip arthroplasty; Tonsillectomy; and right hip surgery. Family: family history includes Cancer in his mother; Diabetes in his father, sister, and sister; Heart disease in his father; Stroke in his father.  Laboratory Chemistry Profile   Renal Lab Results  Component Value Date   BUN 10 09/30/2020   CREATININE 0.97 09/30/2020   GFR 76.84 09/30/2020   GFRAA >60 03/26/2018   GFRNONAA >60 03/26/2018    Hepatic Lab Results  Component Value Date   AST 19 03/22/2020   ALT 26 03/22/2020   ALBUMIN 4.1 03/22/2020   ALKPHOS 99 03/22/2020    Electrolytes Lab Results  Component Value Date   NA 139 09/30/2020   K 4.2 09/30/2020   CL 100 09/30/2020   CALCIUM 9.5 09/30/2020   MG 1.8 03/06/2015    Bone No results found for: VD25OH, VD125OH2TOT, AQ7622QJ3, HL4562BW3, 25OHVITD1, 25OHVITD2, 25OHVITD3, TESTOFREE, TESTOSTERONE  Inflammation (CRP: Acute Phase) (ESR: Chronic Phase) Lab Results  Component Value Date   CRP 0.5 03/06/2015   ESRSEDRATE 45 (H) 12/14/2016         Note: Above Lab results reviewed.  Recent Imaging Review  CT RENAL STONE STUDY  CLINICAL DATA:  Left lower quadrant abdominal pain. History of nephrolithiasis.  EXAM: CT ABDOMEN AND PELVIS WITHOUT CONTRAST  TECHNIQUE: Multidetector CT imaging of the abdomen and pelvis was performed following the standard protocol without IV contrast.  COMPARISON:  10/25/2016 CT abdomen/pelvis.  FINDINGS: Lower chest: No significant pulmonary nodules or acute consolidative airspace disease. Coronary atherosclerosis.  Hepatobiliary: Normal liver size. Scattered punctate granulomatous calcifications in the liver are unchanged. No liver masses. Normal gallbladder with no radiopaque cholelithiasis. No biliary ductal dilatation.  Pancreas: Normal, with no mass or duct dilation.  Spleen: Normal size spleen. Granulomatous splenic calcifications. No splenic masses.  Adrenals/Urinary  Tract: Normal adrenals. No renal stones. No hydronephrosis. Simple bilateral renal cysts, largest 3.6 cm in the upper left kidney and 2.9 cm in the posterior interpolar right kidney. Normal caliber ureters. No ureteral stones, noting poor visualization of the pelvic ureters due to streak artifact from right hip hardware. Bladder obscured by streak artifact. A 3 mm calcification projecting over the posterior bladder just to the right of midline (series 2/image 84), not previously visualized on 10/25/2016 CT, cannot exclude a layering bladder stone.  Stomach/Bowel: Normal non-distended stomach. Normal caliber small bowel with no small bowel wall thickening. Normal appendix. Minimal scattered left colonic diverticulosis with no large bowel wall thickening or significant pericolonic fat stranding.  Vascular/Lymphatic: Atherosclerotic nonaneurysmal abdominal aorta. No pathologically enlarged lymph nodes in the abdomen or pelvis.  Reproductive: Mildly enlarged prostate.  Other: No pneumoperitoneum, ascites or focal fluid collection.  Musculoskeletal: No aggressive appearing focal osseous lesions. Right total hip arthroplasty. Marked lumbar spondylosis.  IMPRESSION: 1. Limited pelvic visualization due to streak artifact from right hip hardware. A 3 mm calcification projecting over the posterior bladder just to the right of midline, not previously visualized on 10/25/2016 CT, cannot exclude a layering bladder stone. No hydronephrosis. No renal collecting system or ureteral stones. 2. Minimal left colonic diverticulosis. 3. Mildly enlarged prostate. 4. Coronary atherosclerosis. 5. Aortic Atherosclerosis (ICD10-I70.0).  Electronically Signed   By: Ilona Sorrel M.D.   On: 05/30/2020 16:17 Note: Reviewed        Physical Exam  General appearance: Well nourished, well developed, and well hydrated. In no apparent acute distress Mental status: Alert, oriented x 3 (person, place, & time)        Respiratory: No evidence of acute respiratory distress Eyes: PERLA Vitals: BP 131/85    Pulse 93    Temp (!) 97 F (36.1 C)    Resp 17    Ht 6' 1" (1.854 m)    Wt 232 lb (105.2 kg)    SpO2 100%    BMI 30.61 kg/m  BMI: Estimated body mass index is 30.61 kg/m as calculated from the following:   Height as of this encounter: 6' 1" (1.854 m).   Weight as of this encounter: 232 lb (105.2 kg). Ideal: Ideal body weight: 79.9 kg (176 lb 2.4 oz) Adjusted ideal body weight: 90 kg (198 lb 7.8 oz)  Assessment   Status Diagnosis  Controlled Controlled Controlled 1. Chronic low back pain (1ry area of Pain) (Bilateral) w/o sciatica   2. Lumbar facet syndrome (Bilateral) (R>L)   3. Chronic lower extremity pain (2ry area of Pain) (Bilateral) (R>L)   4. 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 Chronic knee pain (Bilateral)   6. Chronic neck pain (midline over the C7 spinous processes) (L>R)   7. Chronic pain syndrome   8. Pharmacologic therapy   9.  Chronic use of opiate for therapeutic purpose   10. Encounter for medication management   11. Encounter for chronic pain management      Updated Problems: Problem  Abnormal nerve conduction studies (severe bilateral lower extremity polyneuropathy)   Diagnosed by EMG/PNCV done on 04/25/2014 by Dr. Michaelle Cop[facility] neurology).   Left Flank Pain  History of Kidney Stones  Left-Sided Back Pain  Balance Problem  Throat Tightness  Excessive Gas    Plan of Care  Problem-specific:  No problem-specific Assessment & Plan notes found for this encounter.  Mr. NAI DASCH has a current medication list which includes the following long-term medication(s): bupropion, carvedilol, escitalopram, gabapentin, hydrochlorothiazide, insulin lispro, loratadine, lubiprostone, metformin, mometasone, [START ON 02/26/2021] morphine, [START ON 03/28/2021] morphine, olmesartan, pantoprazole, pravastatin, and  benefiber.  Pharmacotherapy (Medications Ordered): Meds ordered this encounter  Medications   morphine (MS CONTIN) 30 MG 12 hr tablet    Sig: Take 1 tablet (30 mg total) by mouth every 12 (twelve) hours. Must last 30 days. Do not break tablet    Dispense:  60 tablet    Refill:  0    DO NOT: delete (not duplicate); no partial-fill (will deny script to complete), no refill request (F/U required). DISPENSE: 1 day early if closed on fill date. WARN: No CNS-depressants within 8 hrs of med.   morphine (MS CONTIN) 30 MG 12 hr tablet    Sig: Take 1 tablet (30 mg total) by mouth every 12 (twelve) hours. Must last 30 days. Do not break tablet    Dispense:  60 tablet    Refill:  0    DO NOT: delete (not duplicate); no partial-fill (will deny script to complete), no refill request (F/U required). DISPENSE: 1 day early if closed on fill date. WARN: No CNS-depressants within 8 hrs of med.   Orders:  No orders of the defined types were placed in this encounter.  Follow-up plan:   Return in about 3 months (around 04/27/2021) for Eval-day (M,W), (F2F), (MM).     Interventional Therapies  Risk   Complexity Considerations:   Estimated body mass index is 30.61 kg/m as calculated from the following:   Height as of this encounter: 6' 1" (1.854 m).   Weight as of this encounter: 232 lb (105.2 kg). NO MORE RFA - intraoperative noncompliance, unnecessarily increasing radiation exposure NOTE: NO MORE RFA procedures. Difficulty following intra-procedural instructions and commands, resulting in unnecessarily long exposure of staff to radiation.   Planned   Pending:      Under consideration:   Diagnostic Left GONB   Completed:   Therapeutic left lumbar facet RFA x2 (05/09/2019)  Therapeutic right lumbar facet RFA x6 (12/14/2019)  Diagnostic/therapeutic right small joint injection (thumb) x1 (03/15/2018)  Diagnostic/therapeutic left cervical ESI x1 (12/18/2015)    Therapeutic   Palliative (PRN) options:    Palliative lumbar facet MBB  Therapeutic cervical ESI     Recent Visits No visits were found meeting these conditions. Showing recent visits within past 90 days and meeting all other requirements Today's Visits Date Type Provider Dept  01/20/21 Office Visit Milinda Pointer, MD [facility]  Showing today's visits and meeting all other requirements Future Appointments No visits were found meeting these conditions. Showing future appointments within next 90 days and meeting all other requirements  I discussed the assessment and treatment plan with the patient. The patient was provided an opportunity to ask questions and all were answered. The patient agreed with the  plan and demonstrated an understanding of the instructions.  Patient advised to call back or seek an in-person evaluation if the symptoms or condition worsens.  Duration of encounter: 30 minutes.  Note by: Gaspar Cola, MD Date: 01/20/2021; Time: 3:24 PM

## 2021-01-20 ENCOUNTER — Ambulatory Visit: Payer: Medicare Other | Attending: Pain Medicine | Admitting: Pain Medicine

## 2021-01-20 ENCOUNTER — Encounter: Payer: Self-pay | Admitting: Pain Medicine

## 2021-01-20 ENCOUNTER — Other Ambulatory Visit: Payer: Self-pay

## 2021-01-20 DIAGNOSIS — M25561 Pain in right knee: Secondary | ICD-10-CM

## 2021-01-20 DIAGNOSIS — Z79899 Other long term (current) drug therapy: Secondary | ICD-10-CM | POA: Diagnosis present

## 2021-01-20 DIAGNOSIS — Z79891 Long term (current) use of opiate analgesic: Secondary | ICD-10-CM | POA: Diagnosis present

## 2021-01-20 DIAGNOSIS — M79605 Pain in left leg: Secondary | ICD-10-CM

## 2021-01-20 DIAGNOSIS — M545 Low back pain, unspecified: Secondary | ICD-10-CM | POA: Insufficient documentation

## 2021-01-20 DIAGNOSIS — M47816 Spondylosis without myelopathy or radiculopathy, lumbar region: Secondary | ICD-10-CM | POA: Insufficient documentation

## 2021-01-20 DIAGNOSIS — G894 Chronic pain syndrome: Secondary | ICD-10-CM | POA: Diagnosis present

## 2021-01-20 DIAGNOSIS — M542 Cervicalgia: Secondary | ICD-10-CM | POA: Insufficient documentation

## 2021-01-20 DIAGNOSIS — M25562 Pain in left knee: Secondary | ICD-10-CM

## 2021-01-20 DIAGNOSIS — Z96641 Presence of right artificial hip joint: Secondary | ICD-10-CM | POA: Insufficient documentation

## 2021-01-20 DIAGNOSIS — M79604 Pain in right leg: Secondary | ICD-10-CM | POA: Diagnosis not present

## 2021-01-20 DIAGNOSIS — G8929 Other chronic pain: Secondary | ICD-10-CM | POA: Diagnosis present

## 2021-01-20 DIAGNOSIS — M25551 Pain in right hip: Secondary | ICD-10-CM | POA: Insufficient documentation

## 2021-01-20 MED ORDER — MORPHINE SULFATE ER 30 MG PO TBCR: 30.0000 mg | EXTENDED_RELEASE_TABLET | Freq: Two times a day (BID) | ORAL | 0 refills | Status: DC

## 2021-01-20 NOTE — Progress Notes (Signed)
Nursing Pain Medication Assessment:  Safety precautions to be maintained throughout the outpatient stay will include: orient to surroundings, keep bed in low position, maintain call bell within reach at all times, provide assistance with transfer out of bed and ambulation.  Medication Inspection Compliance: Pill count conducted under aseptic conditions, in front of the patient. Neither the pills nor the bottle was removed from the patient's sight at any time. Once count was completed pills were immediately returned to the patient in their original bottle.  Medication: Morphine ER (MSContin) Pill/Patch Count:  21 of 60 pills remain Pill/Patch Appearance: Markings consistent with prescribed medication Bottle Appearance: Standard pharmacy container. Clearly labeled. Filled Date: 59 / 26 / 2022 Last Medication intake:  TodaySafety precautions to be maintained throughout the outpatient stay will include: orient to surroundings, keep bed in low position, maintain call bell within reach at all times, provide assistance with transfer out of bed and ambulation.

## 2021-01-21 ENCOUNTER — Telehealth: Payer: Self-pay | Admitting: Pharmacist

## 2021-01-21 ENCOUNTER — Other Ambulatory Visit: Payer: Self-pay | Admitting: Family Medicine

## 2021-01-21 DIAGNOSIS — Z794 Long term (current) use of insulin: Secondary | ICD-10-CM

## 2021-01-21 MED ORDER — OZEMPIC (2 MG/DOSE) 8 MG/3ML ~~LOC~~ SOPN: 2.0000 mg | PEN_INJECTOR | SUBCUTANEOUS | 3 refills | Status: DC

## 2021-01-21 NOTE — Addendum Note (Signed)
Addended by: De Hollingshead on: 01/21/2021 03:47 PM   Modules accepted: Orders

## 2021-01-21 NOTE — Telephone Encounter (Addendum)
Clinical notes submitted to [facility] via [facility]

## 2021-01-21 NOTE — Telephone Encounter (Signed)
Received PA for Ozempic 2 mg via Cover My Meds. Completed today ([id number])

## 2021-01-21 NOTE — Telephone Encounter (Signed)
I called to [employer] and informed the representative that the paperwork that they are looking for from the office was submitted to [employer] via [facility] and they understood.  Alan Schmidt,cma

## 2021-01-21 NOTE — Telephone Encounter (Signed)
Per Cover [employer] Meds, PA is not needed as PA is already approved through 02/01/2022.   Called [facility]. They said they do not have an Ozempic prescription on file. Reviewed the chart, we had sent refills to [facility] last time. Called [facility], they note that they never filled the prescription. Called patient, he said that he has been filling the [facility] at [facility]. Sent refill to the pharmacy for them to have on file.

## 2021-01-22 ENCOUNTER — Telehealth: Payer: Self-pay | Admitting: Family Medicine

## 2021-01-22 NOTE — Telephone Encounter (Signed)
Pt called in regards to his right foot swelling. Pt states it is very uncomfortable and it is hard for him to get his shoe on. Pt also believes it could be due to his medication. Due to no appt availability pt is going to UC at [facility]. Pt will call back if he has any issues.

## 2021-01-28 ENCOUNTER — Telehealth: Payer: Self-pay

## 2021-01-28 NOTE — Telephone Encounter (Signed)
I received a fax from [facility] that the medication Ozempic 8 mg/47ml was approved on [id number] from 04/29/2020 to 02/01/2021 and [id number] from 02/02/2021 to 12/31/20213 and all he has to do is process at his pharmacy.  Samin Milke,cma

## 2021-01-30 ENCOUNTER — Telehealth: Payer: Self-pay

## 2021-01-30 ENCOUNTER — Other Ambulatory Visit: Payer: Self-pay | Admitting: Family Medicine

## 2021-01-30 NOTE — Telephone Encounter (Signed)
ALPRAZolam (XANAX) 1 MG tablet [[id number]]    Order Details Dose, Route, Frequency: As Directed  Dispense Quantity: 30 tablet Refills: 0   Note to Pharmacy: Not to exceed 4 additional fills before 05/27/2021.       Sig: TAKE 0.5 TABLETS BY MOUTH 2 TIMES DAILY AS NEEDED FOR ANXIETY.       Start Date: 12/29/20 End Date: --  Written Date: 12/29/20 Expiration Date: 06/27/21  Original Order:  ALPRAZolam Duanne Moron) 1 MG tablet [[id number]]  Providers  Authorizing Provider:   [name], MD  [address], [place] [address]  Phone:  [phone]   Fax:  [phone]  DEA #:  [id number]   NPI:  [id number]    Looks like patient was given refill on 12/29/20, is he out now ? Any new symptoms ? If stable I will refill once and needs follow up scheduled within the next month. Please let me know .

## 2021-01-31 NOTE — Telephone Encounter (Signed)
LVM for patient to call back.   Onia Shiflett,cma  

## 2021-01-31 NOTE — Telephone Encounter (Signed)
Last OV 06/28/20 Last filled 12/29/2020 . Can we fill until PCP back in office.

## 2021-02-04 NOTE — Telephone Encounter (Signed)
Was this handled? Patient reached ? Thank you

## 2021-02-05 ENCOUNTER — Ambulatory Visit (INDEPENDENT_AMBULATORY_CARE_PROVIDER_SITE_OTHER): Payer: Medicare Other

## 2021-02-05 VITALS — Ht 73.0 in | Wt 232.0 lb

## 2021-02-05 DIAGNOSIS — Z0000 Encounter for general adult medical examination without abnormal findings: Secondary | ICD-10-CM

## 2021-02-05 NOTE — Progress Notes (Signed)
Subjective:   Alan Schmidt is a 76 y.o. male who presents for Medicare Annual/Subsequent preventive examination.  Review of Systems    No ROS.  Medicare Wellness Virtual Visit.  Visual/audio telehealth visit, UTA vital signs.   See social history for additional risk factors.   Cardiac Risk Factors include: advanced age (>6men, >80 women);male gender;diabetes mellitus;hypertension     Objective:    Today's Vitals   02/05/21 1448  Weight: 232 lb (105.2 kg)  Height: 6\' 1"  (1.854 m)   Body mass index is 30.61 kg/m.  Advanced Directives 02/05/2021 12/25/2020 04/26/2020 11/23/2019 10/18/2019 08/17/2019 07/26/2019  Does Patient Have a Medical Advance Directive? No No No No No No No  Type of Advance Directive - - - - - - -  Does patient want to make changes to medical advance directive? - - - - - - -  Copy of [place] in Chart? - - - - - - -  Would patient like information on creating a medical advance directive? No - Patient declined No - Patient declined No - Patient declined No - Patient declined No - Patient declined No - Patient declined Yes (ED - Information included in AVS)    Current Medications (verified) Outpatient Encounter Medications as of 02/05/2021  Medication Sig   ACCU-CHEK GUIDE test strip USE AS INSTRUCTED DX CODE: E11.9   acetaminophen (TYLENOL) 500 MG tablet Take 500 mg by mouth every 6 (six) hours as needed.   ALPRAZolam (XANAX) 1 MG tablet TAKE 0.5 TABLETS BY MOUTH 2 TIMES DAILY AS NEEDED FOR ANXIETY.   aspirin EC 81 MG tablet Take 81 mg by mouth daily.   buPROPion (WELLBUTRIN XL) 150 MG 24 hr tablet TAKE 1 TABLET (150 MG TOTAL) BY MOUTH DAILY FOR 7 DAYS, THEN 2 TABLETS (300 MG TOTAL) DAILY.   carvedilol (COREG) 25 MG tablet TAKE 1 TABLET BY MOUTH TWICE A DAY   Continuous Blood Gluc Receiver (FREESTYLE LIBRE 14 DAY READER) DEVI 1 Device by Does not apply route daily. Use to scan to check blood sugar up to 7 times daily; E11.42,   Continuous  Blood Gluc Sensor (FREESTYLE LIBRE 14 DAY SENSOR) MISC 1 Device by Does not apply route every 14 (fourteen) days. E11.42   desoximetasone (TOPICORT) 0.25 % cream APPLY CREAM TO AFFECTED AREA TWO TIMES DAILY, FOR UP TO 7 DAYS, DO NOT APPLY TO FACE   EPINEPHrine (EPIPEN 2-PAK) 0.3 mg/0.3 mL IJ SOAJ injection Inject 0.3 mg into the muscle as needed for anaphylaxis.   escitalopram (LEXAPRO) 20 MG tablet TAKE 1 TABLET BY MOUTH EVERY DAY   gabapentin (NEURONTIN) 250 MG/5ML solution TAKE 6 MLS (300 MG TOTAL) BY MOUTH AT BEDTIME.   hydrochlorothiazide (HYDRODIURIL) 25 MG tablet TAKE 1 TABLET BY MOUTH EVERY DAY   insulin degludec (TRESIBA FLEXTOUCH) 100 UNIT/ML FlexTouch Pen Inject 12 Units into the skin daily.   insulin lispro (HUMALOG KWIKPEN) 100 UNIT/ML KwikPen Inject 9 Units into the skin 3 (three) times daily.   Insulin Pen Needle (BD PEN NEEDLE NANO U/F) 32G X 4 MM MISC USE EVERY DAY   JARDIANCE 25 MG TABS tablet TAKE 1 TABLET BY MOUTH EVERY DAY   loratadine (CLARITIN) 10 MG tablet TAKE 1 TABLET BY MOUTH EVERY DAY   lubiprostone (AMITIZA) 8 MCG capsule Take 1 capsule (8 mcg total) by mouth 2 (two) times daily with a meal. Swallow the medication whole. Do not break or chew the medication.   metFORMIN (GLUCOPHAGE) 1000 MG  tablet Take 1 tablet (1,000 mg total) by mouth 2 (two) times daily with a meal.   methocarbamol (ROBAXIN) 500 MG tablet Take 500 mg by mouth 2 (two) times daily.   mometasone (NASONEX) 50 MCG/ACT nasal spray USE 2 SPRAYS INTO EACH NOSTRIL ONCE DAILY   [START ON 02/26/2021] morphine (MS CONTIN) 30 MG 12 hr tablet Take 1 tablet (30 mg total) by mouth every 12 (twelve) hours. Must last 30 days. Do not break tablet   [START ON 03/28/2021] morphine (MS CONTIN) 30 MG 12 hr tablet Take 1 tablet (30 mg total) by mouth every 12 (twelve) hours. Must last 30 days. Do not break tablet   naloxone (NARCAN) 2 MG/2ML injection Inject 1 mL (1 mg total) into the muscle as needed for up to 2 doses (for  emergency use only). Always have available. Inject into thigh muscle and call 911   olmesartan (BENICAR) 40 MG tablet TAKE 1 TABLET BY MOUTH EVERY DAY   pantoprazole (PROTONIX) 40 MG tablet TAKE 1 TABLET BY MOUTH EVERY DAY   pravastatin (PRAVACHOL) 40 MG tablet TAKE 1 TABLET BY MOUTH EVERY DAY   Semaglutide, 2 MG/DOSE, (OZEMPIC, 2 MG/DOSE,) 8 MG/3ML SOPN Inject 2 mg into the skin once a week.   sennosides-docusate sodium (SENOKOT-S) 8.6-50 MG tablet Take 1 tablet by mouth daily as needed.    tamsulosin (FLOMAX) 0.4 MG CAPS capsule TAKE 1 CAPSULE BY MOUTH TWICE A DAY   Wheat Dextrin (BENEFIBER) POWD Take 6 g by mouth 3 (three) times daily before meals. (2 tsp = 6 g)   No facility-administered encounter medications on file as of 02/05/2021.    Allergies (verified) Contrast media [iodinated contrast media], Iodine, and Shellfish allergy   History: Past Medical History:  Diagnosis Date   Acute postoperative pain 11/24/2016   Anxiety    Chronic hip pain (Right) 12/05/2014   Chronic lumbar pain    Depression    Diabetes mellitus without complication ([place])    Hyperlipidemia    Hypertension    Kidney stones    Migraines    Past Surgical History:  Procedure Laterality Date   right hip surgery     4 surgeries   TONSILLECTOMY     TOTAL HIP ARTHROPLASTY     Family History  Problem Relation Age of Onset   Cancer Mother    Heart disease Father    Stroke Father    Diabetes Father    Diabetes Sister    Diabetes Sister    Social History   Socioeconomic History   Marital status: Divorced    Spouse name: 1 Bio; 6 Adopted   Number of children: 7   Years of education: doctorate   Highest education level: Doctorate  Occupational History   Occupation: Retired  Tobacco Use   Smoking status: Former    Types: Cigarettes   Smokeless tobacco: Never  Scientific laboratory technician Use: Never used  Substance and Sexual Activity   Alcohol use: No    Alcohol/week: 0.0 standard drinks   Drug use: No    Sexual activity: Not Currently  Other Topics Concern   Not on file  Social History Narrative   Per patient he has 1 biological child and 6 adopted children   Social Determinants of Radio broadcast assistant Strain: Low Risk    Difficulty of Paying Living Expenses: Not very hard  Food Insecurity: No Food Insecurity   Worried About Running Out of Food in the Last Year: Never  true   Ran Out of Food in the Last Year: Never true  Transportation Needs: No Transportation Needs   Lack of Transportation (Medical): No   Lack of Transportation (Non-Medical): No  Physical Activity: Not on file  Stress: No Stress Concern Present   Feeling of Stress : Only a little  Social Connections: Moderately Integrated   Frequency of Communication with Friends and Family: More than three times a week   Frequency of Social Gatherings with Friends and Family: More than three times a week   Attends Religious Services: More than 4 times per year   Active Member of [employer] or Organizations: No   Attends Music therapist: 1 to 4 times per year   Marital Status: Divorced    Tobacco Counseling Counseling given: Not Answered   Clinical Intake:  Pre-visit preparation completed: Yes        Diabetes: Yes (Followed by PCP and CCM)  How often do you need to have someone help you when you read instructions, pamphlets, or other written materials from your doctor or pharmacy?: 1 - Never  Nutrition Risk Assessment: Does the patient have any non-healing wounds?  No   Financial Strains and Diabetes Management: Is the patient seen by Chronic Care Management for management of their diabetes?  Yes  Would the patient like to be referred to a Nutritionist or for Diabetic Management?  No   Interpreter Needed?: No    Activities of Daily Living In your present state of health, do you have any difficulty performing the following activities: 02/05/2021 04/26/2020  Hearing? N N  Vision? N N  Difficulty  concentrating or making decisions? N N  Walking or climbing stairs? Y Y  [employer], walker in use when ambulating Unsteady Gait/Balance.  Dressing or bathing? N N  Doing errands, shopping? N N  Preparing Food and eating ? Y N  Comment Aide assist -  Using the Toilet? N N  In the past six months, have you accidently leaked urine? N N  Do you have problems with loss of bowel control? N N  Managing your Medications? N N  Managing your Finances? N N  Housekeeping or managing your Housekeeping? Y Y  Comment Aide assist Unsteady Gait/Balance.  Some recent data might be hidden    Patient Care Team: [name], MD as PCP - General (Family Medicine) De Hollingshead, [place] as Pharmacist (Pharmacist) Michiel Cowboy, RN as [place] any recent [place] you may have received from other than [facility] providers in the past year (date may be approximate).     Assessment:   This is a routine wellness examination for [place].  Virtual Visit via Telephone Note  I connected with  Alan Schmidt on 02/05/21 at  2:45 PM EST by telephone and verified that I am speaking with the correct person using two identifiers.  Persons participating in the virtual visit: patient/Nurse Health Advisor   I discussed the limitations, risks, security and privacy concerns of performing an evaluation and management service by telephone and the availability of in person appointments. The patient expressed understanding and agreed to proceed.  Interactive audio and video telecommunications were attempted between this nurse and patient, however failed, due to patient having technical difficulties OR patient did not have access to video capability.  We continued and completed visit with audio only.  Some vital signs may be absent or patient reported.   Hearing/Vision screen Hearing Screening -  Comments:: Patient is able to hear conversational tones without  difficulty. No issues reported. Vision Screening - Comments:: Wears corrective lenses Cataract extraction, bilateral  No retinopathy reported   Dietary issues and exercise activities discussed: Current Exercise Habits: The patient does not participate in regular exercise at present   Goals Addressed               This Visit's Progress     Patient Stated     Medication Monitoring (pt-stated)        Patient Goals/Self-Care Activities Over the next 90 days, patient will:  - take medications as prescribed check glucose at least three times daily using CGM, document, and provide at future appointments check blood pressure periodically, document, and provide at future appointments collaborate with provider on medication access solutions        Depression Screen PHQ 2/9 Scores 02/05/2021 12/25/2020 04/26/2020 03/26/2020 12/04/2019 10/18/2019 08/17/2019  PHQ - 2 Score 0 0 0 0 0 0 0  PHQ- 9 Score - - - - - - -  Exception Documentation - - - - - - -  Not completed - - - - - - -    Fall Risk Fall Risk  02/05/2021 01/20/2021 12/25/2020 10/16/2020 09/13/2020  Falls in the past year? 0 0 1 0 1  Comment - - - - -  Number falls in past yr: 0 - 1 0 1  Injury with Fall? - - 1 - 1  Comment - - - - -  Risk for fall due to : Impaired balance/gait;History of fall(s) - History of fall(s);Impaired balance/gait;Impaired mobility No Fall Risks Impaired mobility;Impaired balance/gait;History of fall(s)  Risk for fall due to: Comment - - - - -  Follow up Falls evaluation completed - Falls prevention discussed;Education provided;Falls evaluation completed Falls evaluation completed Falls prevention discussed;Education provided;Falls evaluation completed    FALL RISK PREVENTION PERTAINING TO THE HOME: Home free of loose throw rugs in walkways, pet beds, electrical cords, etc? Yes  Adequate lighting in your home to reduce risk of falls? Yes   ASSISTIVE DEVICES UTILIZED TO PREVENT FALLS: Life alert? No   Use of a cane, walker or w/c? Yes , cane/rollator in use.  Grab bars in the bathroom? Yes  Shower chair or bench in shower? Yes  Elevated toilet seat or a handicapped toilet? No   TIMED UP AND GO: Was the test performed? No .   Cognitive Function: Patient is alert and oriented x3.  Enjoys playing the piano.  MMSE - Mini Mental State Exam 08/17/2019  Not completed: Unable to complete        Immunizations Immunization History  Administered Date(s) Administered   Fluad Quad(high Dose 65+) 10/26/2018   Influenza, High Dose Seasonal PF 11/21/2015, 10/27/2016, 11/06/2017, 11/24/2019, 11/13/2020   Moderna Covid-19 Vaccine Bivalent Booster 66yrs & up 11/13/2020   Moderna Sars-Covid-2 Vaccination 03/23/2019, 04/20/2019, 11/24/2019, 05/02/2020   Pneumococcal Conjugate-13 11/21/2015   Pneumococcal Polysaccharide-23 10/21/2017   Tdap 04/09/2015   Shingrix Completed?: No.    Education has been provided regarding the importance of this vaccine. Patient has been advised to call insurance company to determine out of pocket expense if they have not yet received this vaccine. Advised may also receive vaccine at local pharmacy or Health Dept. Verbalized acceptance and understanding.  Screening Tests Health Maintenance  Topic Date Due   Zoster Vaccines- Shingrix (1 of 2) Never done   COLONOSCOPY (Pts 45-35yrs Insurance coverage will need to be confirmed)  Never done  Hepatitis C Screening  02/05/2022 (Originally 02/14/1963)   HEMOGLOBIN A1C  04/01/2021   COLON CANCER SCREENING ANNUAL FOBT  08/29/2021   FOOT EXAM  09/30/2021   OPHTHALMOLOGY EXAM  11/12/2021   TETANUS/TDAP  04/08/2025   Pneumonia Vaccine 59+ Years old  Completed   INFLUENZA VACCINE  Completed   COVID-19 Vaccine  Completed   HPV VACCINES  Aged Out   Health Maintenance Health Maintenance Due  Topic Date Due   Zoster Vaccines- Shingrix (1 of 2) Never done   COLONOSCOPY (Pts 45-67yrs Insurance coverage will need to be  confirmed)  Never done   Colonoscopy- deferred.   Lung Cancer Screening: (Low Dose CT Chest recommended if Age 70-80 years, 30 pack-year currently smoking OR have quit w/in 15years.) does not qualify.   Hepatitis C Screening: deferred.   Vision Screening: Recommended annual ophthalmology exams for early detection of glaucoma and other disorders of the eye.  Dental Screening: Recommended annual dental exams for proper oral hygiene  Community Resource Referral / Chronic Care Management: CRR required this visit?  No   CCM required this visit?  No      Plan:   Keep all routine maintenance appointments.   I have personally reviewed and noted the following in the patients chart:   Medical and social history Use of alcohol, tobacco or illicit drugs  Current medications and supplements including opioid prescriptions. Patient is currently taking opioid prescriptions. Information provided to patient regarding non-opioid alternatives. Patient advised to discuss non-opioid treatment plan with their provider. Followed by [facility] clinic with [facility]  Functional ability and status Nutritional status Physical activity Advanced directives List of other physicians Hospitalizations, surgeries, and ER visits in previous 12 months Vitals Screenings to include cognitive, depression, and falls Referrals and appointments  In addition, I have reviewed and discussed with patient certain preventive protocols, quality metrics, and best practice recommendations. A written personalized care plan for preventive services as well as general preventive health recommendations were provided to patient.     Varney Biles, LPN   04/10/4534

## 2021-02-05 NOTE — Telephone Encounter (Signed)
Filled BY provider in office on 01/31/21

## 2021-02-05 NOTE — Patient Instructions (Addendum)
°  [name] , Thank you for taking time to come for your Medicare Wellness Visit. I appreciate your ongoing commitment to your health goals. Please review the following plan we discussed and let me know if I can assist you in the future.   These are the goals we discussed:  Goals       Patient Stated     Medication Monitoring (pt-stated)      Patient Goals/Self-Care Activities Over the next 90 days, patient will:  - take medications as prescribed check glucose at least three times daily using CGM, document, and provide at future appointments check blood pressure periodically, document, and provide at future appointments collaborate with provider on medication access solutions         This is a list of the screening recommended for you and due dates:  Health Maintenance  Topic Date Due   Zoster (Shingles) Vaccine (1 of 2) Never done   Colon Cancer Screening  Never done   Hepatitis C Screening: USPSTF Recommendation to screen - Ages 18-79 yo.  02/05/2022*   Hemoglobin A1C  04/01/2021   Stool Blood Test  08/29/2021   Complete foot exam   09/30/2021   Eye exam for diabetics  11/12/2021   Tetanus Vaccine  04/08/2025   Pneumonia Vaccine  Completed   Flu Shot  Completed   COVID-19 Vaccine  Completed   HPV Vaccine  Aged Out  *Topic was postponed. The date shown is not the original due date.

## 2021-02-05 NOTE — Telephone Encounter (Signed)
Noted! Thank you

## 2021-02-10 DIAGNOSIS — M5442 Lumbago with sciatica, left side: Secondary | ICD-10-CM | POA: Diagnosis not present

## 2021-02-10 DIAGNOSIS — M5441 Lumbago with sciatica, right side: Secondary | ICD-10-CM | POA: Diagnosis not present

## 2021-02-18 ENCOUNTER — Telehealth: Payer: Medicare Other

## 2021-02-18 ENCOUNTER — Telehealth: Payer: Self-pay | Admitting: Pharmacist

## 2021-02-18 NOTE — Telephone Encounter (Signed)
°  Chronic Care Management   Note  02/18/2021 Name: ANCHOR DWAN MRN: [id number] DOB: 09/21/1945   Attempted to contact patient for scheduled appointment for medication management support. Left HIPAA compliant message for patient to return my call at their convenience.    Plan: - If I do not hear back from the patient by end of business today, will collaborate with Care Guide to outreach to schedule follow up with me   [name], PharmD, [place], [place] Pharmacist [employer] at [employer] [phone]

## 2021-02-20 ENCOUNTER — Other Ambulatory Visit: Payer: Self-pay | Admitting: Family Medicine

## 2021-02-20 DIAGNOSIS — E1142 Type 2 diabetes mellitus with diabetic polyneuropathy: Secondary | ICD-10-CM

## 2021-02-21 NOTE — Telephone Encounter (Signed)
Looks like appt has already been scheduled for 04/23/2021

## 2021-02-26 DIAGNOSIS — E1165 Type 2 diabetes mellitus with hyperglycemia: Secondary | ICD-10-CM | POA: Diagnosis not present

## 2021-03-07 ENCOUNTER — Other Ambulatory Visit: Payer: Self-pay | Admitting: Internal Medicine

## 2021-03-13 DIAGNOSIS — E1140 Type 2 diabetes mellitus with diabetic neuropathy, unspecified: Secondary | ICD-10-CM | POA: Diagnosis not present

## 2021-03-13 DIAGNOSIS — L851 Acquired keratosis [keratoderma] palmaris et plantaris: Secondary | ICD-10-CM | POA: Diagnosis not present

## 2021-03-13 DIAGNOSIS — Z794 Long term (current) use of insulin: Secondary | ICD-10-CM | POA: Diagnosis not present

## 2021-03-13 DIAGNOSIS — B351 Tinea unguium: Secondary | ICD-10-CM | POA: Diagnosis not present

## 2021-03-18 ENCOUNTER — Other Ambulatory Visit: Payer: Self-pay | Admitting: *Deleted

## 2021-03-18 NOTE — Patient Outreach (Signed)
[name]) Care Management  03/18/2021  DONTRAY HABERLAND [birth year] [id number]  [facility]) Care Management RN Health Coach Note   03/18/2021 Name:  Alan Schmidt MRN:  [id number] DOB:  [birth year]  Summary: Patient states he is doing fairly well. He reports decreasing the amount of sugar in his diet and as a result his blood sugar ranges have decreased to 115-150 with an occasional pop up to >200. Patient states he is not taking his B/P at home. Nurse provided education of the importance of monitoring B/P and patient states he will start to take his B/P 3 times weekly. Nurse encouraged patient to ask PCP to order a 3 in 1 commode seat during his upcoming visit on 03/26/21. Patient did not have any further questions or concerns today and did confirm that he has this nurse's contact number to call her if needed.   Recommendations/Changes made from today's visit: Use a large mirror to assist with looking at your feet daily; ask your aide for assistance Monitor your blood sugar using Freestyle 3 times daily and B/P 3 times weekly, limit sugar, carbohydrates, and sodium in diet, and contact Dr. [name] for consistently high blood sugars >200 or frequent high B/Ps >150/90   Subjective: BOWE SIDOR is an 76 y.o. year old male who is a primary patient of [name], [name], MD. The care management team was consulted for assistance with care management and/or care coordination needs.    RN Health Coach completed Telephone Visit today.   Objective:  Medications Reviewed Today     Reviewed by Michiel Cowboy, RN (Registered Nurse) on 03/18/21 at 1222  Med List Status: <None>   Medication Order Taking? Sig Documenting Provider Last Dose Status Informant  ACCU-CHEK GUIDE test strip [id number] Yes USE AS INSTRUCTED DX CODE: E11.9 [name], MD Taking Active   acetaminophen (TYLENOL) 500 MG tablet [id number] Yes Take 500 mg by mouth every 6 (six)  hours as needed. [provider] Taking Active            Med Note Darnelle Maffucci, Arville Lime   Wed Sep 11, 2020  1:21 PM)    ALPRAZolam Duanne Moron) 1 MG tablet [id number] Yes TAKE 0.5 TABLETS BY MOUTH 2 TIMES DAILY AS NEEDED FOR ANXIETY. Dutch Quint B, FNP Taking Active   aspirin EC 81 MG tablet [id number] Yes Take 81 mg by mouth daily. [provider] Taking Active   buPROPion (WELLBUTRIN XL) 150 MG 24 hr tablet [id number] Yes TAKE 1 TABLET (150 MG TOTAL) BY MOUTH DAILY FOR 7 DAYS, THEN 2 TABLETS (300 MG TOTAL) DAILY. [name], MD Taking Active   carvedilol (COREG) 25 MG tablet [id number] Yes TAKE 1 TABLET BY MOUTH TWICE A DAY [name] [name], MD Taking Active   Continuous Blood Gluc Receiver (FREESTYLE LIBRE 14 DAY READER) DEVI [id number] Yes 1 Device by Does not apply route daily. Use to scan to check blood sugar up to 7 times daily; E11.42, [name], MD Taking Active   Continuous Blood Gluc Sensor (FREESTYLE LIBRE [place]) [place] [id number] Yes 1 Device by Does not apply route every 14 (fourteen) days. E11.42 [name], MD Taking Active   desoximetasone (TOPICORT) 0.25 % cream [id number] Yes APPLY CREAM TO AFFECTED AREA TWO TIMES DAILY, FOR UP TO 7 DAYS, DO NOT APPLY TO Benita Stabile, MD Taking Active   EPINEPHrine (EPIPEN 2-PAK) 0.3 mg/0.3 mL IJ SOAJ injection [id number] Yes Inject  0.3 mg into the muscle as needed for anaphylaxis. [name], MD Taking Active   escitalopram (LEXAPRO) 20 MG tablet [id number] Yes TAKE 1 TABLET BY MOUTH EVERY DAY [name], MD Taking Active   gabapentin (NEURONTIN) 250 MG/5ML solution [id number] Yes TAKE 6 MLS (300 MG TOTAL) BY MOUTH AT BEDTIME. [name], MD Taking Active   hydrochlorothiazide (HYDRODIURIL) 25 MG tablet [id number] Yes TAKE 1 TABLET BY MOUTH EVERY DAY [name], MD Taking Active   insulin degludec (TRESIBA FLEXTOUCH) 100 UNIT/ML FlexTouch Pen [id number] Yes Inject 12  Units into the skin daily. [name], MD Taking Active   insulin lispro [facility]) 100 UNIT/ML KwikPen [id number] Yes Inject 9 Units into the skin 3 (three) times daily. [name], MD Taking Active   Insulin Pen Needle (BD PEN NEEDLE NANO U/F) 32G X 4 MM MISC [id number] Yes USE EVERY DAY [name], MD Taking Active   JARDIANCE 25 MG TABS tablet [id number] Yes TAKE 1 TABLET BY MOUTH EVERY DAY [name], MD Taking Active   loratadine (CLARITIN) 10 MG tablet [id number] Yes TAKE 1 TABLET BY MOUTH EVERY DAY [name], MD Taking Active   lubiprostone (AMITIZA) 8 MCG capsule [id number] Yes Take 1 capsule (8 mcg total) by mouth 2 (two) times daily with a meal. Swallow the medication whole. Do not break or chew the medication. Milinda Pointer, MD Taking Active            Med Note Kelby Aline Sep 11, 2020  1:24 PM) Taking PRN  metFORMIN (GLUCOPHAGE) 1000 MG tablet [id number] Yes TAKE 1 TABLET BY MOUTH TWICE A DAY WITH MEALS [name], MD Taking Active   methocarbamol (ROBAXIN) 500 MG tablet [id number] Yes Take 500 mg by mouth 2 (two) times daily. [provider] Taking Active   mometasone (NASONEX) 50 MCG/ACT nasal spray [id number] Yes USE 2 SPRAYS INTO EACH NOSTRIL ONCE DAILY [name], MD Taking Active   morphine (MS CONTIN) 30 MG 12 hr tablet [id number] Yes Take 1 tablet (30 mg total) by mouth every 12 (twelve) hours. Must last 30 days. Do not break tablet Milinda Pointer, MD Taking Active   morphine (MS CONTIN) 30 MG 12 hr tablet [id number] Yes Take 1 tablet (30 mg total) by mouth every 12 (twelve) hours. Must last 30 days. Do not break tablet Milinda Pointer, MD Taking Active            Med Note Dossie Arbour, McGr[place] A   Mon Jan 20, 2021  3:02 PM) WARNING: Not a Duplicate. Future prescription. DO NOT DELETE during hospital medication reconciliation or at discharge. [facility] Chronic Pain Management Patient   naloxone  [facility]) 2 MG/2ML injection [id number] Yes Inject 1 mL (1 mg total) into the muscle as needed for up to 2 doses (for emergency use only). Always have available. Inject into thigh muscle and call [place] Milinda Pointer, MD Taking Active   olmesartan (BENICAR) 40 MG tablet [id number] Yes TAKE 1 TABLET BY MOUTH EVERY DAY [name], MD Taking Active   pantoprazole (PROTONIX) 40 MG tablet [id number] Yes TAKE 1 TABLET BY MOUTH EVERY DAY [name], MD Taking Active   pravastatin (PRAVACHOL) 40 MG tablet [id number] Yes TAKE 1 TABLET BY MOUTH EVERY DAY [name], MD Taking Active   Semaglutide, 2 MG/DOSE, (OZEMPIC, 2 MG/DOSE,) 8 MG/3ML SOPN [id number] Yes Inject 2 mg into the skin once a week. [name]  G, MD Taking Active   sennosides-docusate sodium (SENOKOT-S) 8.6-50 MG tablet [id number] Yes Take 1 tablet by mouth daily as needed.  [provider] Taking Active   tamsulosin (FLOMAX) 0.4 MG CAPS capsule [id number] Yes TAKE 1 CAPSULE BY MOUTH TWICE A DAY [name], MD Taking Active   Wheat Dextrin [facility]) POWD [id number]  Take 6 g by mouth 3 (three) times daily before meals. (2 tsp = 6 g) Milinda Pointer, MD  Expired 10/16/20 2359   Med List Note Landis Martins, [place] 01/20/21 1459): UDS 10/16/20 MR 04-27-21             SDOH:  (Social Determinants of Health) assessments and interventions performed: SDOH assessments completed today and documented in the Epic system.     Care Plan  Review of patient past medical history, allergies, medications, health status, including review of consultants reports, laboratory and other test data, was performed as part of comprehensive evaluation for care management services.   Care Plan : RN Care Manager Plan of Care  Updates made by Michiel Cowboy, RN since 03/18/2021 12:00 AM     Problem: Knowledge Deficit Related to Diabetes and Hypertension   Priority: High     Long-Range Goal: Development of Plan of Care  for Management of Diabetes and Hypertension   Start Date: 12/25/2020  Expected End Date: 01/01/2022  Priority: High  Note:   Current Barriers:  Knowledge Deficits related to plan of care for management of HTN and DMII   RNCM Clinical Goal(s):  Patient will verbalize understanding of plan for management of HTN and DMII as evidenced by continuation of monitoring blood sugar 2-3 times daily and B/P 3 times weekly, adhering to diabetic and low sodium diet  take all medications exactly as prescribed and will call provider for medication related questions as evidenced by patients verbalization of taking his medication as written and contacting provider for any questions or concerns    demonstrate improved adherence to prescribed treatment plan for HTN and DMII as evidenced by monitoring blood sugar and B/P closely, and adhering to a low sodium diabetic diet   continue to work with Consulting civil engineer and/or Social Worker to address care management and care coordination needs related to HTN and DMII as evidenced by adherence to CM Team Scheduled appointments     through collaboration with Consulting civil engineer, provider, and care team.   Interventions: Inter-disciplinary care team collaboration (see longitudinal plan of care) Evaluation of current treatment plan related to  self management and patient's adherence to plan as established by provider  Diabetes Interventions:  (Status:  Goal on track:  Yes.) Long Term Goal Assessed patient's understanding of A1c goal: <7% Provided education to patient about basic DM disease process Reviewed medications with patient and discussed importance of medication adherence Discussed plans with patient for ongoing care management follow up and provided patient with direct contact information for care management team Provided patient with written educational materials related to hypo and hyperglycemia and importance of correct treatment Advised patient, providing education  and rationale, to check cbg 3 times daily and record, calling PCP for findings outside established parameters Discussed importance of monitoring blood sugar via CGM frequently, adhering to diabetic diet, and contacting provider for consistent high blood sugar values  Discussed importance of diabetic foot care; encouraged patient to use a mirror to look at feet and to ask his aide to assist  Lab Results  Component Value Date   HGBA1C 8.5 (H)  09/30/2020   Hypertension Interventions:  (Status:  New goal.) Long Term Goal Last practice recorded BP readings:  BP Readings from Last 3 Encounters:  01/20/21 131/85  10/16/20 111/69  09/30/20 140/80  Most recent eGFR/CrCl: No results found for: EGFR  No components found for: CRCL  Evaluation of current treatment plan related to hypertension self management and patient's adherence to plan as established by provider Reviewed medications with patient and discussed importance of compliance Advised patient, providing education and rationale, to monitor blood pressure daily and record, calling PCP for findings outside established parameters Provided education on prescribed diet low sodium Discussed monitoring B/P 3 times weekly  Encouraged patient to increase physical activity as tolerated due to pain  Patient Goals/Self-Care Activities: Take medications as prescribed   Attend all scheduled provider appointments Call provider office for new concerns or questions  schedule appointment with eye doctor check blood sugar at prescribed times: before meals and at bedtime check feet daily for cuts, sores or redness take the blood sugar meter to all doctor visits drink 6 to 8 glasses of water each day fill half of plate with vegetables read food labels for fat, fiber, carbohydrates and portion size check blood pressure 3 times per week write blood pressure results in a log or diary keep a blood pressure log begin an exercise program Use a large mirror  to assist with looking at your feet daily; ask your aide for assistance Monitor your blood sugar using Freestyle 3 times daily and B/P 3 times weekly, limit sugar, carbohydrates, and sodium, and contact Dr. [name] for consistently high blood sugars >200 or frequent high B/Ps >150/90           Plan: Telephone follow up appointment with care management team member scheduled for:  May. Nurse will send PCP today's assessment note.  [name] RN, [place] [phone] [email or web address]

## 2021-03-18 NOTE — Patient Instructions (Signed)
Visit Information  Thank you for taking time to visit with me today. Please don't hesitate to contact me if I can be of assistance to you before our next scheduled telephone appointment.  Following are the goals we discussed today:  Patient Goals/Self-Care Activities: Take medications as prescribed   Attend all scheduled provider appointments Call provider office for new concerns or questions  schedule appointment with eye doctor check blood sugar at prescribed times: before meals and at bedtime check feet daily for cuts, sores or redness take the blood sugar meter to all doctor visits drink 6 to 8 glasses of water each day fill half of plate with vegetables read food labels for fat, fiber, carbohydrates and portion size check blood pressure 3 times per week write blood pressure results in a log or diary keep a blood pressure log begin an exercise program Use a large mirror to assist with looking at your feet daily; ask your aide for assistance Monitor your blood sugar using Freestyle 3 times daily and B/P 3 times weekly, limit sugar, carbohydrates, and sodium, and contact Dr. [name] for consistently high blood sugars >200 or frequent high B/Ps >150/90   The patient verbalized understanding of instructions, educational materials, and care plan provided today and agreed to receive a mailed copy of patient instructions, educational materials, and care plan.   Telephone follow up appointment with care management team member scheduled for: May  [name] RN, [place] [phone] [email or web address]

## 2021-03-22 ENCOUNTER — Other Ambulatory Visit: Payer: Self-pay | Admitting: Family Medicine

## 2021-03-26 ENCOUNTER — Ambulatory Visit: Payer: Medicare Other | Admitting: Family Medicine

## 2021-03-28 DIAGNOSIS — E1165 Type 2 diabetes mellitus with hyperglycemia: Secondary | ICD-10-CM | POA: Diagnosis not present

## 2021-04-07 ENCOUNTER — Telehealth (INDEPENDENT_AMBULATORY_CARE_PROVIDER_SITE_OTHER): Payer: Medicare Other | Admitting: Family Medicine

## 2021-04-07 ENCOUNTER — Encounter: Payer: Self-pay | Admitting: Family Medicine

## 2021-04-07 ENCOUNTER — Other Ambulatory Visit: Payer: Self-pay

## 2021-04-07 VITALS — Ht 73.0 in | Wt 232.0 lb

## 2021-04-07 DIAGNOSIS — E785 Hyperlipidemia, unspecified: Secondary | ICD-10-CM

## 2021-04-07 DIAGNOSIS — E1142 Type 2 diabetes mellitus with diabetic polyneuropathy: Secondary | ICD-10-CM

## 2021-04-07 DIAGNOSIS — Z794 Long term (current) use of insulin: Secondary | ICD-10-CM | POA: Diagnosis not present

## 2021-04-07 DIAGNOSIS — M7989 Other specified soft tissue disorders: Secondary | ICD-10-CM | POA: Diagnosis not present

## 2021-04-07 NOTE — Progress Notes (Signed)
? ?Virtual Visit via telephone Note ? ?This visit type was conducted due to national recommendations for restrictions regarding the COVID-19 pandemic (e.g. social distancing).  This format is felt to be most appropriate for this patient at this time.  All issues noted in this document were discussed and addressed.  No physical exam was performed (except for noted visual exam findings with Video Visits).  ? ?I connected with Alan Schmidt today at  4:30 PM EST by telephone and verified that I am speaking with the correct person using two identifiers. ?Location patient: home ?Location provider: work  ?Persons participating in the virtual visit: patient, provider ? ?I discussed the limitations, risks, security and privacy concerns of performing an evaluation and management service by telephone and the availability of in person appointments. I also discussed with the patient that there may be a patient responsible charge related to this service. The patient expressed understanding and agreed to proceed. ? ?Interactive audio and video telecommunications were attempted between this provider and patient, however failed, due to patient having technical difficulties OR patient did not have access to video capability.  We continued and completed visit with audio only.  ? ?Reason for visit: same day visit ? ?HPI: ?Bilateral leg edema: This has been going on for a few weeks now.  He saw orthopedics and they gave him compression stockings.  He had to cut the compression stocking off of the left foot.  He had lots of indentations in his legs.  He notes the swelling looks somewhat better today.  Notes the swelling was bad enough to cause dry scaly skin though there are no open wounds.  He notes no orthopnea, PND, or shortness of breath.  He notes the swelling generally resolves overnight. ? ? ?ROS: See pertinent positives and negatives per HPI. ? ?Past Medical History:  ?Diagnosis Date  ? Acute postoperative pain 11/24/2016  ?  Anxiety   ? Chronic hip pain (Right) 12/05/2014  ? Chronic lumbar pain   ? Depression   ? Diabetes mellitus without complication ([place])   ? Hyperlipidemia   ? Hypertension   ? Kidney stones   ? Migraines   ? ? ?Past Surgical History:  ?Procedure Laterality Date  ? right hip surgery    ? 4 surgeries  ? TONSILLECTOMY    ? TOTAL HIP ARTHROPLASTY    ? ? ?Family History  ?Problem Relation Age of Onset  ? Cancer Mother   ? Heart disease Father   ? Stroke Father   ? Diabetes Father   ? Diabetes Sister   ? Diabetes Sister   ? ? ?SOCIAL HX: Former smoker ? ? ?Current Outpatient Medications:  ?  ACCU-CHEK GUIDE test strip, USE AS INSTRUCTED DX CODE: E11.9, Disp: 100 strip, Rfl: 5 ?  acetaminophen (TYLENOL) 500 MG tablet, Take 500 mg by mouth every 6 (six) hours as needed., Disp: , Rfl:  ?  ALPRAZolam (XANAX) 1 MG tablet, TAKE 0.5 TABLETS BY MOUTH 2 TIMES DAILY AS NEEDED FOR ANXIETY., Disp: 30 tablet, Rfl: 0 ?  aspirin EC 81 MG tablet, Take 81 mg by mouth daily., Disp: , Rfl:  ?  buPROPion (WELLBUTRIN XL) 150 MG 24 hr tablet, TAKE 1 TABLET (150 MG TOTAL) BY MOUTH DAILY FOR 7 DAYS, THEN 2 TABLETS (300 MG TOTAL) DAILY., Disp: 180 tablet, Rfl: 1 ?  carvedilol (COREG) 25 MG tablet, TAKE 1 TABLET BY MOUTH TWICE A DAY, Disp: 60 tablet, Rfl: 5 ?  Continuous Blood Gluc Receiver (FREESTYLE [place]  14 DAY READER) DEVI, 1 Device by Does not apply route daily. Use to scan to check blood sugar up to 7 times daily; E11.42,, Disp: 1 Device, Rfl: 1 ?  Continuous Blood Gluc Sensor (FREESTYLE LIBRE 14 DAY SENSOR) MISC, 1 Device by Does not apply route every 14 (fourteen) days. E11.42, Disp: 6 each, Rfl: 3 ?  desoximetasone (TOPICORT) 0.25 % cream, APPLY CREAM TO AFFECTED AREA TWO TIMES DAILY, FOR UP TO 7 DAYS, DO NOT APPLY TO FACE, Disp: 30 g, Rfl: 0 ?  EPINEPHrine (EPIPEN 2-PAK) 0.3 mg/0.3 mL IJ SOAJ injection, Inject 0.3 mg into the muscle as needed for anaphylaxis., Disp: 2 each, Rfl: 0 ?  escitalopram (LEXAPRO) 20 MG tablet, TAKE 1 TABLET BY  MOUTH EVERY DAY, Disp: 30 tablet, Rfl: 5 ?  gabapentin (NEURONTIN) 250 MG/5ML solution, TAKE 6 MLS (300 MG TOTAL) BY MOUTH AT BEDTIME., Disp: 180 mL, Rfl: 2 ?  hydrochlorothiazide (HYDRODIURIL) 25 MG tablet, TAKE 1 TABLET BY MOUTH EVERY DAY, Disp: 30 tablet, Rfl: 5 ?  insulin degludec (TRESIBA FLEXTOUCH) 100 UNIT/ML FlexTouch Pen, Inject 12 Units into the skin daily., Disp: 15 mL, Rfl: 2 ?  insulin lispro (HUMALOG KWIKPEN) 100 UNIT/ML KwikPen, Inject 9 Units into the skin 3 (three) times daily., Disp: 30 mL, Rfl: 2 ?  Insulin Pen Needle (BD PEN NEEDLE NANO U/F) 32G X 4 MM MISC, USE EVERY DAY, Disp: 100 each, Rfl: 4 ?  JARDIANCE 25 MG TABS tablet, TAKE 1 TABLET BY MOUTH EVERY DAY, Disp: 30 tablet, Rfl: 3 ?  loratadine (CLARITIN) 10 MG tablet, TAKE 1 TABLET BY MOUTH EVERY DAY, Disp: 30 tablet, Rfl: 11 ?  lubiprostone (AMITIZA) 8 MCG capsule, Take 1 capsule (8 mcg total) by mouth 2 (two) times daily with a meal. Swallow the medication whole. Do not break or chew the medication., Disp: 60 capsule, Rfl: 2 ?  metFORMIN (GLUCOPHAGE) 1000 MG tablet, TAKE 1 TABLET BY MOUTH TWICE A DAY WITH MEALS, Disp: 60 tablet, Rfl: 5 ?  methocarbamol (ROBAXIN) 500 MG tablet, Take 500 mg by mouth 2 (two) times daily., Disp: , Rfl:  ?  mometasone (NASONEX) 50 MCG/ACT nasal spray, USE 2 SPRAYS INTO EACH NOSTRIL ONCE DAILY, Disp: 17 each, Rfl: 3 ?  morphine (MS CONTIN) 30 MG 12 hr tablet, Take 1 tablet (30 mg total) by mouth every 12 (twelve) hours. Must last 30 days. Do not break tablet, Disp: 60 tablet, Rfl: 0 ?  naloxone (NARCAN) 2 MG/2ML injection, Inject 1 mL (1 mg total) into the muscle as needed for up to 2 doses (for emergency use only). Always have available. Inject into thigh muscle and call 911, Disp: 2 mL, Rfl: 1 ?  olmesartan (BENICAR) 40 MG tablet, TAKE 1 TABLET BY MOUTH EVERY DAY, Disp: 30 tablet, Rfl: 5 ?  pantoprazole (PROTONIX) 40 MG tablet, TAKE 1 TABLET BY MOUTH EVERY DAY, Disp: 30 tablet, Rfl: 5 ?  pravastatin (PRAVACHOL)  40 MG tablet, TAKE 1 TABLET BY MOUTH EVERY DAY, Disp: 30 tablet, Rfl: 5 ?  Semaglutide, 2 MG/DOSE, (OZEMPIC, 2 MG/DOSE,) 8 MG/3ML SOPN, Inject 2 mg into the skin once a week., Disp: 9 mL, Rfl: 3 ?  sennosides-docusate sodium (SENOKOT-S) 8.6-50 MG tablet, Take 1 tablet by mouth daily as needed. , Disp: , Rfl:  ?  tamsulosin (FLOMAX) 0.4 MG CAPS capsule, TAKE 1 CAPSULE BY MOUTH TWICE A DAY, Disp: 60 capsule, Rfl: 8 ?  morphine (MS CONTIN) 30 MG 12 hr tablet, Take 1 tablet (30  mg total) by mouth every 12 (twelve) hours. Must last 30 days. Do not break tablet, Disp: 60 tablet, Rfl: 0 ?  Wheat Dextrin (BENEFIBER) POWD, Take 6 g by mouth 3 (three) times daily before meals. (2 tsp = 6 g), Disp: 730 g, Rfl: 2 ? ?EXAM: ?This was a telephone visit and thus no exam was completed. ? ?ASSESSMENT AND PLAN: ? ?Discussed the following assessment and plan: ? ?Problem List Items Addressed This Visit   ? ? Diabetes ([place])  ? Relevant Orders  ? HgB A1c  ? Hyperlipidemia  ? Relevant Orders  ? Lipid panel  ? Leg swelling - Primary  ?  Recent onset bilateral leg swelling.  He has no symptoms of CHF.  Symptoms are bilateral making a DVT unlikely.  Discussed multiple potential causes of his swelling.  Discussed the possibility of venous insufficiency.  Discussed labs to evaluate for further causes.  These will be scheduled for the patient. ?  ?  ? Relevant Orders  ? Comp Met (CMET)  ? CBC  ? TSH  ? ? ?No follow-ups on file. ?  ?I discussed the assessment and treatment plan with the patient. The patient was provided an opportunity to ask questions and all were answered. The patient agreed with the plan and demonstrated an understanding of the instructions. ?  ?The patient was advised to call back or seek an in-person evaluation if the symptoms worsen or if the condition fails to improve as anticipated. ? ?I provided 12 minutes of non-face-to-face time during this encounter. ? ? ?[name], MD  ? ?

## 2021-04-07 NOTE — Assessment & Plan Note (Signed)
Recent onset bilateral leg swelling.  He has no symptoms of CHF.  Symptoms are bilateral making a DVT unlikely.  Discussed multiple potential causes of his swelling.  Discussed the possibility of venous insufficiency.  Discussed labs to evaluate for further causes.  These will be scheduled for the patient. ?

## 2021-04-08 ENCOUNTER — Ambulatory Visit: Payer: Self-pay | Admitting: Pharmacist

## 2021-04-08 ENCOUNTER — Other Ambulatory Visit: Payer: Self-pay | Admitting: Family Medicine

## 2021-04-08 NOTE — Chronic Care Management (AMB) (Signed)
?  Chronic Care Management  ? ?Note ? ?04/08/2021 ?Name: Alan Schmidt MRN: [id number] DOB: 30-Nov-1945 ? ? ? ?Closing pharmacy CCM case at this time. Will collaborate with Care Guide to outreach to schedule follow up with RN CM. Patient has clinic contact information for future questions or concerns.  ? ?[name], PharmD, [place], CPP ?Clinical Pharmacist ?Therapist, music at [employer] ?[phone] ? ?

## 2021-04-08 NOTE — Progress Notes (Signed)
PROVIDER NOTE: Information contained herein reflects review and annotations entered in association with encounter. Interpretation of such information and data should be left to medically-trained personnel. Information provided to patient can be located elsewhere in the medical record under "Patient Instructions". Document created using STT-dictation technology, any transcriptional errors that may result from process are unintentional.    Patient: Alan Schmidt  Service Category: E/M  Provider: Gaspar Cola, MD  DOB: Mar 21, 1945  DOS: 04/09/2021  Specialty: Interventional Pain Management  MRN: [id number]  Setting: Ambulatory outpatient  PCP: [name], MD  Type: Established Patient    Referring Provider: [name], MD  Location: Office  Delivery: Face-to-face     HPI  Alan Schmidt, a 76 y.o. year old male, is here today because of his Chronic pain syndrome [G89.4]. Alan Schmidt primary complain today is Neck Pain Last encounter: My last encounter with him was on 01/20/2021. Pertinent problems: Alan Schmidt has Chronic low back pain (Bilateral) (R>L) w/ sciatica (Bilateral); Lumbar facet syndrome (Bilateral) (R>L); Lumbar spondylosis; Diabetic peripheral neuropathy ([place]); Chronic neck pain (midline over the C7 spinous processes) (L>R); Neurogenic pain; Neuropathic pain; Chronic lower extremity pain (2ry area of Pain) (Bilateral) (R>L); 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) (Right L5 dermatome); History of total hip replacement (Right); Chronic hip pain (Right); Abnormal nerve conduction studies (severe bilateral lower extremity polyneuropathy); Chronic shoulder pain (Bilateral) (R>L); Occipital neuralgia (Left); Cervicogenic headache (Left); Chronic shoulder radicular pain (Left); Chronic cervical radicular pain (Left); Chronic pain syndrome; Lumbar facet joint osteoarthritis (Bilateral); Chronic headache; Chronic tension-type headache, intractable; Coccygodynia;  DDD (degenerative disc disease), lumbar; Chronic musculoskeletal pain; Chronic knee pain (Bilateral); Thumb pain (Right); 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 Pain Assessment: Severity of Chronic pain is reported as a 3 /10. Location: Neck (back) Right, Left/pain radiaities down his upper and shouldermy shoulder. Onset: More than a month ago. Quality: Stabbing, Shooting, Aching, Constant. Timing: Constant. Modifying factor(s): meds, ice. Vitals:  height is 6' 1" (1.854 m) and weight is 232 lb (105.2 kg). His temperature is 97.2 F (36.2 C) (abnormal). His blood pressure is 116/68 and his pulse is 73. His respiration is 15 and oxygen saturation is 97%.   Reason for encounter: medication management.   The patient indicates doing well with the current medication regimen. No adverse reactions or side effects reported to the medications.   RTCB: 07/10/2021 Nonopioids transferred 12/14/2019: Gabapentin, Amitiza, Benefiber, Mobic  Pharmacotherapy Assessment  Analgesic: Morphine ER (MS Contin) 30 mg, 1 tab p.o. twice daily (60 mg/day).  (Previously on oxycodone IR 10 mg every 6 hours (40 mg/day)) MME/day: 60 mg/day.   Monitoring: [place] PMP: PDMP reviewed during this encounter.       Pharmacotherapy: No side-effects or adverse reactions reported. Compliance: No problems identified. Effectiveness: Clinically acceptable.  Chauncey Fischer, RN  04/09/2021 12:58 PM  Sign when Signing Visit Nursing Pain Medication Assessment:  Safety precautions to be maintained throughout the outpatient stay will include: orient to surroundings, keep bed in low position, maintain call bell within reach at all times, provide  assistance with transfer out of bed and ambulation.  Medication Inspection Compliance: Pill count conducted under aseptic conditions, in front of the patient. Neither the pills nor the bottle was removed  from the patient's sight at any time. Once count was completed pills were immediately returned to the patient in their original bottle.  Medication: Morphine ER (MSContin) Pill/Patch Count:  7 of 60 pills remain Pill/Patch Appearance: Markings consistent with prescribed medication Bottle Appearance: Standard pharmacy container. Clearly labeled. Filled Date: 2 / 3 / 2023 Last Medication intake:  TodaySafety precautions to be maintained throughout the outpatient stay will include: orient to surroundings, keep bed in low position, maintain call bell within reach at all times, provide assistance with transfer out of bed and ambulation.     UDS:  Summary  Date Value Ref Range Status  10/16/2020 Note  Final    Comment:    ==================================================================== [employer] [facility] 13 (MW) ==================================================================== Test                             Result       Flag       Units  Drug Present and Declared for Prescription Verification   Alprazolam                     86           EXPECTED   ng/mg creat   Alpha-hydroxyalprazolam        110          EXPECTED   ng/mg creat    Source of alprazolam is a scheduled prescription medication. Alpha-    hydroxyalprazolam is an expected metabolite of alprazolam.    Morphine                       >11905       EXPECTED   ng/mg creat   Normorphine                    571          EXPECTED   ng/mg creat    Potential sources of large amounts of morphine in the absence of    codeine include administration of morphine or use of heroin.     Normorphine is an expected metabolite of morphine.    Hydromorphone                  237          EXPECTED   ng/mg creat    Hydromorphone may be present as a  metabolite of morphine;    concentrations of hydromorphone rarely exceed 5% of the morphine    concentration when this is the source of hydromorphone.  ==================================================================== Test                      Result    Flag   Units      Ref Range   Creatinine              84               mg/dL      >=20 ==================================================================== Declared Medications:  The flagging and interpretation on this report are based on the  following declared medications.  Unexpected results may arise from  inaccuracies in the declared medications.   **Note: The testing scope of this panel includes these medications:   Alprazolam (Xanax)  Morphine (MS Contin)   **Note: The testing scope of this panel does not include the  following reported medications:   Acetaminophen (Tylenol)  Aspirin  Bupropion (Wellbutrin XL)  Carvedilol (Coreg)  Docusate  Empagliflozin (Jardiance)  Epinephrine (EpiPen)  Escitalopram (Lexapro)  Gabapentin (Neurontin)  Hydrochlorothiazide  Insulin Tyler Aas)  Loratadine (Claritin)  Lubiprostone (Amitiza)  Metformin (Glucophage)  Methocarbamol (Robaxin)  Mometasone (Nasonex)  Naloxone (Narcan)  Olmesartan (Benicar)  Pantoprazole (Protonix)  Pravastatin (Pravachol)  Semaglutide (Ozempic)  Sennosides  Supplement (Fiber)  Tamsulosin (Flomax)  Topical ==================================================================== For clinical consultation, please call [phone]. ====================================================================      ROS  Constitutional: Denies any fever or chills Gastrointestinal: No reported hemesis, hematochezia, vomiting, or acute GI distress Musculoskeletal: Denies any acute onset joint swelling, redness, loss of ROM, or weakness Neurological: No reported episodes of acute onset apraxia, aphasia, dysarthria, agnosia, amnesia, paralysis, loss of coordination,  or loss of consciousness  Medication Review  ALPRAZolam, Benefiber, EPINEPHrine, FreeStyle Libre 14 Day Reader, FreeStyle Libre 14 Day Sensor, Insulin Pen Needle, Semaglutide (2 MG/DOSE), acetaminophen, aspirin EC, buPROPion, carvedilol, desoximetasone, empagliflozin, escitalopram, gabapentin, glucose blood, hydrochlorothiazide, insulin degludec, insulin lispro, liraglutide, loratadine, lubiprostone, metFORMIN, methocarbamol, mometasone, morphine, naloxone, olmesartan, pantoprazole, pravastatin, sennosides-docusate sodium, and tamsulosin  History Review  Allergy: Mr. Cromwell is allergic to contrast media [iodinated contrast media], iodine, and shellfish allergy. Drug: Mr. Tawil  reports no history of drug use. Alcohol:  reports no history of alcohol use. Tobacco:  reports that he has quit smoking. His smoking use included cigarettes. He has never used smokeless tobacco. Social: Mr. Duerson  reports that he has quit smoking. His smoking use included cigarettes. He has never used smokeless tobacco. He reports that he does not drink alcohol and does not use drugs. Medical:  has a past medical history of Acute postoperative pain (11/24/2016), Anxiety, Chronic hip pain (Right) (12/05/2014), Chronic lumbar pain, Depression, Diabetes mellitus without complication ([place]), Hyperlipidemia, Hypertension, Kidney stones, and Migraines. Surgical: Mr. Parthasarathy  has a past surgical history that includes Total hip arthroplasty; Tonsillectomy; and right hip surgery. Family: family history includes Cancer in his mother; Diabetes in his father, sister, and sister; Heart disease in his father; Stroke in his father.  Laboratory Chemistry Profile   Renal Lab Results  Component Value Date   BUN 10 09/30/2020   CREATININE 0.97 09/30/2020   GFR 76.84 09/30/2020   GFRAA >60 03/26/2018   GFRNONAA >60 03/26/2018    Hepatic Lab Results  Component Value Date   AST 19 03/22/2020   ALT 26 03/22/2020   ALBUMIN 4.1  03/22/2020   ALKPHOS 99 03/22/2020    Electrolytes Lab Results  Component Value Date   NA 139 09/30/2020   K 4.2 09/30/2020   CL 100 09/30/2020   CALCIUM 9.5 09/30/2020   MG 1.8 03/06/2015    Bone No results found for: VD25OH, VD125OH2TOT, BJ6283TD1, VO1607PX1, 25OHVITD1, 25OHVITD2, 25OHVITD3, TESTOFREE, TESTOSTERONE  Inflammation (CRP: Acute Phase) (ESR: Chronic Phase) Lab Results  Component Value Date   CRP 0.5 03/06/2015   ESRSEDRATE 45 (H) 12/14/2016         Note: Above Lab results reviewed.  Recent Imaging Review  CT RENAL STONE STUDY CLINICAL DATA:  Left lower quadrant abdominal pain. History of nephrolithiasis.  EXAM: CT ABDOMEN AND PELVIS WITHOUT CONTRAST  TECHNIQUE: Multidetector CT imaging of the abdomen and pelvis was performed following the standard protocol without IV contrast.  COMPARISON:  10/25/2016 CT abdomen/pelvis.  FINDINGS: Lower chest: No significant pulmonary nodules or acute consolidative airspace disease. Coronary atherosclerosis.  Hepatobiliary: Normal liver size. Scattered punctate granulomatous calcifications in the liver are unchanged. No liver masses. Normal gallbladder with no radiopaque  cholelithiasis. No biliary ductal dilatation.  Pancreas: Normal, with no mass or duct dilation.  Spleen: Normal size spleen. Granulomatous splenic calcifications. No splenic masses.  Adrenals/Urinary Tract: Normal adrenals. No renal stones. No hydronephrosis. Simple bilateral renal cysts, largest 3.6 cm in the upper left kidney and 2.9 cm in the posterior interpolar right kidney. Normal caliber ureters. No ureteral stones, noting poor visualization of the pelvic ureters due to streak artifact from right hip hardware. Bladder obscured by streak artifact. A 3 mm calcification projecting over the posterior bladder just to the right of midline (series 2/image 84), not previously visualized on 10/25/2016 CT, cannot exclude a layering bladder  stone.  Stomach/Bowel: Normal non-distended stomach. Normal caliber small bowel with no small bowel wall thickening. Normal appendix. Minimal scattered left colonic diverticulosis with no large bowel wall thickening or significant pericolonic fat stranding.  Vascular/Lymphatic: Atherosclerotic nonaneurysmal abdominal aorta. No pathologically enlarged lymph nodes in the abdomen or pelvis.  Reproductive: Mildly enlarged prostate.  Other: No pneumoperitoneum, ascites or focal fluid collection.  Musculoskeletal: No aggressive appearing focal osseous lesions. Right total hip arthroplasty. Marked lumbar spondylosis.  IMPRESSION: 1. Limited pelvic visualization due to streak artifact from right hip hardware. A 3 mm calcification projecting over the posterior bladder just to the right of midline, not previously visualized on 10/25/2016 CT, cannot exclude a layering bladder stone. No hydronephrosis. No renal collecting system or ureteral stones. 2. Minimal left colonic diverticulosis. 3. Mildly enlarged prostate. 4. Coronary atherosclerosis. 5. Aortic Atherosclerosis (ICD10-I70.0).  Electronically Signed   By: Ilona Sorrel M.D.   On: 05/30/2020 16:17 Note: Reviewed        Physical Exam  General appearance: Well nourished, well developed, and well hydrated. In no apparent acute distress Mental status: Alert, oriented x 3 (person, place, & time)       Respiratory: No evidence of acute respiratory distress Eyes: PERLA Vitals: BP 116/68    Pulse 73    Temp (!) 97.2 F (36.2 C)    Resp 15    Ht 6' 1" (1.854 m)    Wt 232 lb (105.2 kg)    SpO2 97%    BMI 30.61 kg/m  BMI: Estimated body mass index is 30.61 kg/m as calculated from the following:   Height as of this encounter: 6' 1" (1.854 m).   Weight as of this encounter: 232 lb (105.2 kg). Ideal: Ideal body weight: 79.9 kg (176 lb 2.4 oz) Adjusted ideal body weight: 90 kg (198 lb 7.8 oz)  Assessment   Status Diagnosis   Controlled Controlled Controlled 1. Chronic pain syndrome   2. Chronic low back pain (1ry area of Pain) (Bilateral) w/o sciatica   3. Chronic lower extremity pain (2ry area of Pain) (Bilateral) (R>L)   4. Chronic neck pain (midline over the C7 spinous processes) (L>R)   5. 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 Chronic knee pain (Bilateral)   7. Lumbar facet syndrome (Bilateral) (R>L)   8. Pharmacologic therapy   9. Chronic use of opiate for therapeutic purpose   10. Encounter for medication management   11. Encounter for chronic pain management      Updated Problems: No problems updated.  Plan of Care  Problem-specific:  No problem-specific Assessment & Plan notes found for this encounter.  Mr. STOKELY JEANCHARLES has a current medication list which includes the following long-term medication(s): bupropion, carvedilol, escitalopram, gabapentin, hydrochlorothiazide, insulin lispro, loratadine, lubiprostone, metformin, mometasone, olmesartan, pantoprazole, pravastatin, victoza, [START  ON 04/11/2021] morphine, [START ON 05/11/2021] morphine, [START ON 06/10/2021] morphine, and benefiber.  Pharmacotherapy (Medications Ordered): Meds ordered this encounter  Medications   DISCONTD: morphine (MS CONTIN) 30 MG 12 hr tablet    Sig: Take 1 tablet (30 mg total) by mouth every 12 (twelve) hours. Must last 30 days. Do not break tablet    Dispense:  60 tablet    Refill:  0    DO NOT: delete (not duplicate); no partial-fill (will deny script to complete), no refill request (F/U required). DISPENSE: 1 day early if closed on fill date. WARN: No CNS-depressants within 8 hrs of med.   DISCONTD: morphine (MS CONTIN) 30 MG 12 hr tablet    Sig: Take 1 tablet (30 mg total) by mouth every 12 (twelve) hours. Must last 30 days. Do not break tablet    Dispense:  60 tablet    Refill:  0    DO NOT: delete (not duplicate); no partial-fill (will deny script to complete), no refill request  (F/U required). DISPENSE: 1 day early if closed on fill date. WARN: No CNS-depressants within 8 hrs of med.   DISCONTD: morphine (MS CONTIN) 30 MG 12 hr tablet    Sig: Take 1 tablet (30 mg total) by mouth every 12 (twelve) hours. Must last 30 days. Do not break tablet    Dispense:  60 tablet    Refill:  0    DO NOT: delete (not duplicate); no partial-fill (will deny script to complete), no refill request (F/U required). DISPENSE: 1 day early if closed on fill date. WARN: No CNS-depressants within 8 hrs of med.   morphine (MS CONTIN) 30 MG 12 hr tablet    Sig: Take 1 tablet (30 mg total) by mouth every 12 (twelve) hours. Must last 30 days. Do not break tablet    Dispense:  60 tablet    Refill:  0    DO NOT: delete (not duplicate); no partial-fill (will deny script to complete), no refill request (F/U required). DISPENSE: 1 day early if closed on fill date. WARN: No CNS-depressants within 8 hrs of med.   morphine (MS CONTIN) 30 MG 12 hr tablet    Sig: Take 1 tablet (30 mg total) by mouth every 12 (twelve) hours. Must last 30 days. Do not break tablet    Dispense:  60 tablet    Refill:  0    DO NOT: delete (not duplicate); no partial-fill (will deny script to complete), no refill request (F/U required). DISPENSE: 1 day early if closed on fill date. WARN: No CNS-depressants within 8 hrs of med.   morphine (MS CONTIN) 30 MG 12 hr tablet    Sig: Take 1 tablet (30 mg total) by mouth every 12 (twelve) hours. Must last 30 days. Do not break tablet    Dispense:  60 tablet    Refill:  0    DO NOT: delete (not duplicate); no partial-fill (will deny script to complete), no refill request (F/U required). DISPENSE: 1 day early if closed on fill date. WARN: No CNS-depressants within 8 hrs of med.   Orders:  No orders of the defined types were placed in this encounter.  Follow-up plan:   Return in about 3 months (around 07/10/2021) for Eval-day (M,W), (F2F), (MM).     Interventional Therapies  Risk    Complexity Considerations:   Estimated body mass index is 30.61 kg/m as calculated from the following:   Height as of this encounter: 6' 1" (1.854  m).   Weight as of this encounter: 232 lb (105.2 kg). NO MORE RFA - intraoperative noncompliance, unnecessarily increasing radiation exposure NOTE: NO MORE RFA procedures. Difficulty following intra-procedural instructions and commands, resulting in unnecessarily long exposure of staff to radiation.   Planned   Pending:      Under consideration:   Diagnostic Left GONB   Completed:   Therapeutic left lumbar facet RFA x2 (05/09/2019)  Therapeutic right lumbar facet RFA x6 (12/14/2019)  Diagnostic/therapeutic right small joint injection (thumb) x1 (03/15/2018)  Diagnostic/therapeutic left cervical ESI x1 (12/18/2015)    Therapeutic   Palliative (PRN) options:   Palliative lumbar facet MBB  Therapeutic cervical ESI      Recent Visits Date Type Provider Dept  01/20/21 Office Visit Milinda Pointer, MD [facility]  Showing recent visits within past 90 days and meeting all other requirements Today's Visits Date Type Provider Dept  04/09/21 Office Visit Milinda Pointer, MD [facility]  Showing today's visits and meeting all other requirements Future Appointments Date Type Provider Dept  07/02/21 Appointment Milinda Pointer, MD [facility]  Showing future appointments within next 90 days and meeting all other requirements  I discussed the assessment and treatment plan with the patient. The patient was provided an opportunity to ask questions and all were answered. The patient agreed with the plan and demonstrated an understanding of the instructions.  Patient advised to call back or seek an in-person evaluation if the symptoms or condition worsens.  Duration of encounter: 30 minutes.  Note by: Gaspar Cola, MD Date: 04/09/2021; Time: 2:23 PM

## 2021-04-09 ENCOUNTER — Other Ambulatory Visit: Payer: Self-pay

## 2021-04-09 ENCOUNTER — Encounter: Payer: Self-pay | Admitting: Pain Medicine

## 2021-04-09 ENCOUNTER — Ambulatory Visit: Payer: Medicare Other | Attending: Pain Medicine | Admitting: Pain Medicine

## 2021-04-09 VITALS — BP 116/68 | HR 73 | Temp 97.2°F | Resp 15 | Ht 73.0 in | Wt 232.0 lb

## 2021-04-09 DIAGNOSIS — Z79891 Long term (current) use of opiate analgesic: Secondary | ICD-10-CM | POA: Diagnosis not present

## 2021-04-09 DIAGNOSIS — M47816 Spondylosis without myelopathy or radiculopathy, lumbar region: Secondary | ICD-10-CM

## 2021-04-09 DIAGNOSIS — M79604 Pain in right leg: Secondary | ICD-10-CM

## 2021-04-09 DIAGNOSIS — Z96641 Presence of right artificial hip joint: Secondary | ICD-10-CM

## 2021-04-09 DIAGNOSIS — M79605 Pain in left leg: Secondary | ICD-10-CM | POA: Diagnosis not present

## 2021-04-09 DIAGNOSIS — Z79899 Other long term (current) drug therapy: Secondary | ICD-10-CM | POA: Diagnosis not present

## 2021-04-09 DIAGNOSIS — M545 Low back pain, unspecified: Secondary | ICD-10-CM | POA: Diagnosis not present

## 2021-04-09 DIAGNOSIS — G894 Chronic pain syndrome: Secondary | ICD-10-CM

## 2021-04-09 DIAGNOSIS — M542 Cervicalgia: Secondary | ICD-10-CM

## 2021-04-09 DIAGNOSIS — M25562 Pain in left knee: Secondary | ICD-10-CM

## 2021-04-09 DIAGNOSIS — M25551 Pain in right hip: Secondary | ICD-10-CM | POA: Diagnosis not present

## 2021-04-09 DIAGNOSIS — M25561 Pain in right knee: Secondary | ICD-10-CM

## 2021-04-09 DIAGNOSIS — G8929 Other chronic pain: Secondary | ICD-10-CM

## 2021-04-09 MED ORDER — MORPHINE SULFATE ER 30 MG PO TBCR: 30.0000 mg | EXTENDED_RELEASE_TABLET | Freq: Two times a day (BID) | ORAL | 0 refills | Status: DC

## 2021-04-09 NOTE — Patient Instructions (Signed)
____________________________________________________________________________________________ ° °Medication Rules ° °Purpose: To inform patients, and their family members, of our rules and regulations. ° °Applies to: All patients receiving prescriptions (written or electronic). ° °Pharmacy of record: Pharmacy where electronic prescriptions will be sent. If written prescriptions are taken to a different pharmacy, please inform the nursing staff. The pharmacy listed in the electronic medical record should be the one where you would like electronic prescriptions to be sent. ° °Electronic prescriptions: In compliance with the [place] [employer]reng[place] Opioid Misuse Prevention (STOP) Act of 2017 (Session Law 2017-74/H243), effective February 02, 2018, all controlled substances must be electronically prescribed. Calling prescriptions to the pharmacy will cease to exist. ° °Prescription refills: Only during scheduled appointments. Applies to all prescriptions. ° °NOTE: The following applies primarily to controlled substances (Opioid* Pain Medications).  ° °Type of encounter (visit): For patients receiving controlled substances, face-to-face visits are required. (Not an option or up to the patient.) ° °Patient's responsibilities: °Pain Pills: Bring all pain pills to every appointment (except for procedure appointments). °Pill Bottles: Bring pills in original pharmacy bottle. Always bring the newest bottle. Bring bottle, even if empty. °Medication refills: You are responsible for knowing and keeping track of what medications you take and those you need refilled. °The day before your appointment: write a list of all prescriptions that need to be refilled. °The day of the appointment: give the list to the admitting nurse. Prescriptions will be written only during appointments. No prescriptions will be written on procedure days. °If you forget a medication: it will not be "Called in", "Faxed", or "electronically sent". You will  need to get another appointment to get these prescribed. °No early refills. Do not call asking to have your prescription filled early. °Prescription Accuracy: You are responsible for carefully inspecting your prescriptions before leaving our office. Have the discharge nurse carefully go over each prescription with you, before taking them home. Make sure that your name is accurately spelled, that your address is correct. Check the name and dose of your medication to make sure it is accurate. Check the number of pills, and the written instructions to make sure they are clear and accurate. Make sure that you are given enough medication to last until your next medication refill appointment. °Taking Medication: Take medication as prescribed. When it comes to controlled substances, taking less pills or less frequently than prescribed is permitted and encouraged. °Never take more pills than instructed. °Never take medication more frequently than prescribed.  °Inform other Doctors: Always inform, all of your healthcare providers, of all the medications you take. °Pain Medication from other Providers: You are not allowed to accept any additional pain medication from any other Doctor or Healthcare provider. There are two exceptions to this rule. (see below) In the event that you require additional pain medication, you are responsible for notifying us, as stated below. °Cough Medicine: Often these contain an opioid, such as codeine or hydrocodone. Never accept or take cough medicine containing these opioids if you are already taking an opioid* medication. The combination may cause respiratory failure and death. °Medication Agreement: You are responsible for carefully reading and following our Medication Agreement. This must be signed before receiving any prescriptions from our practice. Safely store a copy of your signed Agreement. Violations to the Agreement will result in no further prescriptions. (Additional copies of our  Medication Agreement are available upon request.) °Laws, Rules, & Regulations: All patients are expected to follow all Federal and State Laws, Statutes, Rules, & Regulations. Ignorance of   the Laws does not constitute a valid excuse.  °Illegal drugs and Controlled Substances: The use of illegal substances (including, but not limited to marijuana and its derivatives) and/or the illegal use of any controlled substances is strictly prohibited. Violation of this rule may result in the immediate and permanent discontinuation of any and all prescriptions being written by our practice. The use of any illegal substances is prohibited. °Adopted CDC guidelines & recommendations: Target dosing levels will be at or below 60 MME/day. Use of benzodiazepines** is not recommended. ° °Exceptions: There are only two exceptions to the rule of not receiving pain medications from other Healthcare Providers. °Exception #1 (Emergencies): In the event of an emergency (i.e.: accident requiring emergency care), you are allowed to receive additional pain medication. However, you are responsible for: As soon as you are able, call our office [phone], at any time of the day or night, and leave a message stating your name, the date and nature of the emergency, and the name and dose of the medication prescribed. In the event that your call is answered by a member of our staff, make sure to document and save the date, time, and the name of the person that took your information.  °Exception #2 (Planned Surgery): In the event that you are scheduled by another doctor or dentist to have any type of surgery or procedure, you are allowed (for a period no longer than 30 days), to receive additional pain medication, for the acute post-op pain. However, in this case, you are responsible for picking up a copy of our "Post-op Pain Management for Surgeons" handout, and giving it to your surgeon or dentist. This document is available at our office, and  does not require an appointment to obtain it. Simply go to our office during business hours (Monday-Thursday from 8:00 AM to 4:00 PM) (Friday 8:00 AM to 12:00 Noon) or if you have a scheduled appointment with us, prior to your surgery, and ask for it by name. In addition, you are responsible for: calling our office [phone], at any time of the day or night, and leaving a message stating your name, name of your surgeon, type of surgery, and date of procedure or surgery. Failure to comply with your responsibilities may result in termination of therapy involving the controlled substances. °Medication Agreement Violation. Following the above rules, including your responsibilities will help you in avoiding a Medication Agreement Violation (“Breaking your Pain Medication Contract”). ° °*Opioid medications include: morphine, codeine, oxycodone, oxymorphone, hydrocodone, hydromorphone, meperidine, tramadol, tapentadol, buprenorphine, fentanyl, methadone. °**Benzodiazepine medications include: diazepam (Valium), alprazolam (Xanax), clonazepam (Klonopine), lorazepam (Ativan), clorazepate (Tranxene), chlordiazepoxide (Librium), estazolam (Prosom), oxazepam (Serax), temazepam (Restoril), triazolam (Halcion) °(Last updated: 10/30/2020) °____________________________________________________________________________________________ ° ____________________________________________________________________________________________ ° °Medication Recommendations and Reminders ° °Applies to: All patients receiving prescriptions (written and/or electronic). ° °Medication Rules & Regulations: These rules and regulations exist for your safety and that of others. They are not flexible and neither are we. Dismissing or ignoring them will be considered "non-compliance" with medication therapy, resulting in complete and irreversible termination of such therapy. (See document titled "Medication Rules" for more details.) In all conscience,  because of safety reasons, we cannot continue providing a therapy where the patient does not follow instructions. ° °Pharmacy of record:  °Definition: This is the pharmacy where your electronic prescriptions will be sent.  °We do not endorse any particular pharmacy, however, we have experienced problems with [facility] not securing enough medication supply for the community. °We do not restrict you   in your choice of pharmacy. However, once we write for your prescriptions, we will NOT be re-sending more prescriptions to fix restricted supply problems created by your pharmacy, or your insurance.  °The pharmacy listed in the electronic medical record should be the one where you want electronic prescriptions to be sent. °If you choose to change pharmacy, simply notify our nursing staff. ° °Recommendations: °Keep all of your pain medications in a safe place, under lock and key, even if you live alone. We will NOT replace lost, stolen, or damaged medication. °After you fill your prescription, take 1 week's worth of pills and put them away in a safe place. You should keep a separate, properly labeled bottle for this purpose. The remainder should be kept in the original bottle. Use this as your primary supply, until it runs out. Once it's gone, then you know that you have 1 week's worth of medicine, and it is time to come in for a prescription refill. If you do this correctly, it is unlikely that you will ever run out of medicine. °To make sure that the above recommendation works, it is very important that you make sure your medication refill appointments are scheduled at least 1 week before you run out of medicine. To do this in an effective manner, make sure that you do not leave the office without scheduling your next medication management appointment. Always ask the nursing staff to show you in your prescription , when your medication will be running out. Then arrange for the receptionist to get you a return appointment,  at least 7 days before you run out of medicine. Do not wait until you have 1 or 2 pills left, to come in. This is very poor planning and does not take into consideration that we may need to cancel appointments due to bad weather, sickness, or emergencies affecting our staff. °DO NOT ACCEPT A "Partial Fill": If for any reason your pharmacy does not have enough pills/tablets to completely fill or refill your prescription, do not allow for a "partial fill". The law allows the pharmacy to complete that prescription within 72 hours, without requiring a new prescription. If they do not fill the rest of your prescription within those 72 hours, you will need a separate prescription to fill the remaining amount, which we will NOT provide. If the reason for the partial fill is your insurance, you will need to talk to the pharmacist about payment alternatives for the remaining tablets, but again, DO NOT ACCEPT A PARTIAL FILL, unless you can trust your pharmacist to obtain the remainder of the pills within 72 hours. ° °Prescription refills and/or changes in medication(s):  °Prescription refills, and/or changes in dose or medication, will be conducted only during scheduled medication management appointments. (Applies to both, written and electronic prescriptions.) °No refills on procedure days. No medication will be changed or started on procedure days. No changes, adjustments, and/or refills will be conducted on a procedure day. Doing so will interfere with the diagnostic portion of the procedure. °No phone refills. No medications will be "called into the pharmacy". °No Fax refills. °No weekend refills. °No Holliday refills. °No after hours refills. ° °Remember:  °Business hours are:  °Monday to Thursday 8:00 AM to 4:00 PM °Provider's Schedule: °Jafari Mckillop, MD - Appointments are:  °Medication management: Monday and Wednesday 8:00 AM to 4:00 PM °Procedure day: Tuesday and Thursday 7:30 AM to 4:00 PM °Bilal Lateef, MD -  Appointments are:  °Medication management: Tuesday and Thursday 8:00   AM to 4:00 PM °Procedure day: Monday and Wednesday 7:30 AM to 4:00 PM °(Last update: 08/23/2019) °____________________________________________________________________________________________ ° ____________________________________________________________________________________________ ° °CBD (cannabidiol) & Delta-8 (Delta-8 tetrahydrocannabinol) WARNING ° °Intro: Cannabidiol (CBD) and tetrahydrocannabinol (THC), are two natural compounds found in plants of the Cannabis genus. They can both be extracted from hemp or cannabis. Hemp and cannabis come from the Cannabis sativa plant. Both compounds interact with your body’s endocannabinoid system, but they have very different effects. CBD does not produce the high sensation associated with cannabis. Delta-8 tetrahydrocannabinol, also known as delta-8 THC, is a psychoactive substance found in the Cannabis sativa plant, of which marijuana and hemp are two varieties. THC is responsible for the high associated with the illicit use of marijuana. ° °Applicable to: All individuals currently taking or considering taking CBD (cannabidiol) and, more important, all patients taking opioid analgesic controlled substances (pain medication). (Example: oxycodone; oxymorphone; hydrocodone; hydromorphone; morphine; methadone; tramadol; tapentadol; fentanyl; buprenorphine; butorphanol; dextromethorphan; meperidine; codeine; etc.) ° °Legal status: CBD remains a Schedule I drug prohibited for any use. CBD is illegal with one exception. In the [place], CBD has a limited Food and Drug Administration (FDA) approval for the treatment of two specific types of epilepsy disorders. Only one CBD product has been approved by the FDA for this purpose: "Epidiolex". FDA is aware that some companies are marketing products containing cannabis and cannabis-derived compounds in ways that violate the Federal Food, Drug and Cosmetic Act  (FD&C Act) and that may put the health and safety of consumers at risk. The FDA, a Federal agency, has not enforced the CBD status since 2018. UPDATE: (03/21/2021) The Drug Enforcement Agency (DEA) issued a letter stating that "delta" cannabinoids, including Delta-8-THCO and Delta-9-THCO, synthetically derived from hemp do not qualify as hemp and will be viewed as Schedule I drugs. (Schedule I drugs, substances, or chemicals are defined as drugs with no currently accepted medical use and a high potential for abuse. Some examples of Schedule I drugs are: heroin, lysergic acid diethylamide (LSD), marijuana (cannabis), 3,4-methylenedioxymethamphetamine (ecstasy), methaqualone, and peyote.) (https://www.dea.gov) ° °Legality: Some manufacturers ship CBD products nationally, which is illegal. Often such products are sold online and are therefore available throughout the country. CBD is openly sold in head shops and health food stores in some states where such sales have not been explicitly legalized. Selling unapproved products with unsubstantiated therapeutic claims is not only a violation of the law, but also can put patients at risk, as these products have not been proven to be safe or effective. Federal illegality makes it difficult to conduct research on CBD. ° °Reference: "FDA Regulation of Cannabis and Cannabis-Derived Products, Including Cannabidiol (CBD)" - https://www.fda.gov/news-events/public-health-focus/fda-regulation-cannabis-and-cannabis-derived-products-including-cannabidiol-cbd ° °Warning: CBD is not FDA approved and has not undergo the same manufacturing controls as prescription drugs.  This means that the purity and safety of available CBD may be questionable. Most of the time, despite manufacturer's claims, it is contaminated with THC (delta-9-tetrahydrocannabinol - the chemical in marijuana responsible for the "HIGH").  When this is the case, the THC contaminant will trigger a positive urine drug  screen (UDS) test for Marijuana (carboxy-THC). Because a positive UDS for any illicit substance is a violation of our medication agreement, your opioid analgesics (pain medicine) may be permanently discontinued. °The FDA recently put out a warning about 5 things that everyone should be aware of regarding Delta-8 THC: °Delta-8 THC products have not been evaluated or approved by the FDA for safe use and may be marketed in ways that put the   public health at risk. °The FDA has received adverse event reports involving delta-8 THC-containing products. °Delta-8 THC has psychoactive and intoxicating effects. °Delta-8 THC manufacturing often involve use of potentially harmful chemicals to create the concentrations of delta-8 THC claimed in the marketplace. The final delta-8 THC product may have potentially harmful by-products (contaminants) due to the chemicals used in the process. Manufacturing of delta-8 THC products may occur in uncontrolled or unsanitary settings, which may lead to the presence of unsafe contaminants or other potentially harmful substances. °Delta-8 THC products should be kept out of the reach of children and pets. ° °MORE ABOUT CBD ° °General Information: CBD was discovered in 1940 and it is a derivative of the cannabis sativa genus plants (Marijuana and Hemp). It is one of the 113 identified substances found in Marijuana. It accounts for up to 40% of the plant's extract. As of 2018, preliminary clinical studies on CBD included research for the treatment of anxiety, movement disorders, and pain. CBD is available and consumed in multiple forms, including inhalation of smoke or vapor, as an aerosol spray, and by mouth. It may be supplied as an oil containing CBD, capsules, dried cannabis, or as a liquid solution. CBD is thought not to be as psychoactive as THC (delta-9-tetrahydrocannabinol - the chemical in marijuana responsible for the "HIGH"). Studies suggest that CBD may interact with different  biological target receptors in the body, including cannabinoid and other neurotransmitter receptors. As of 2018 the mechanism of action for its biological effects has not been determined. ° °Side-effects   Adverse reactions: Dry mouth, diarrhea, decreased appetite, fatigue, drowsiness, malaise, weakness, sleep disturbances, and others. ° °Drug interactions: CBC may interact with other medications such as blood-thinners. Because CBD causes drowsiness on its own, it also increases the drowsiness caused by other medications, including antihistamines (such as Benadryl), benzodiazepines (Xanax, Ativan, Valium), antipsychotics, antidepressants and opioids, as well as alcohol and supplements such as kava, melatonin and St. John's Wort. Be cautious with the following combinations:  ° °Brivaracetam (Briviact) °Brivaracetam is changed and broken down by the body. CBD might decrease how quickly the body breaks down brivaracetam. This might increase levels of brivaracetam in the body. ° °Caffeine °Caffeine is changed and broken down by the body. CBD might decrease how quickly the body breaks down caffeine. This might increase levels of caffeine in the body. ° °Carbamazepine (Tegretol) °Carbamazepine is changed and broken down by the body. CBD might decrease how quickly the body breaks down carbamazepine. This might increase levels of carbamazepine in the body and increase its side effects. ° °Citalopram (Celexa) °Citalopram is changed and broken down by the body. CBD might decrease how quickly the body breaks down citalopram. This might increase levels of citalopram in the body and increase its side effects. ° °Clobazam (Onfi) °Clobazam is changed and broken down by the liver. CBD might decrease how quickly the liver breaks down clobazam. This might increase the effects and side effects of clobazam. ° °Eslicarbazepine (Aptiom) °Eslicarbazepine is changed and broken down by the body. CBD might decrease how quickly the body  breaks down eslicarbazepine. This might increase levels of eslicarbazepine in the body by a small amount. ° °Everolimus (Zostress) °Everolimus is changed and broken down by the body. CBD might decrease how quickly the body breaks down everolimus. This might increase levels of everolimus in the body. ° °Lithium °Taking higher doses of CBD might increase levels of lithium. This can increase the risk of lithium toxicity. ° °Medications changed by the   liver (Cytochrome P450 1A1 (CYP1A1) substrates) °Some medications are changed and broken down by the liver. CBD might change how quickly the liver breaks down these medications. This could change the effects and side effects of these medications. ° °Medications changed by the liver (Cytochrome P450 1A2 (CYP1A2) substrates) °Some medications are changed and broken down by the liver. CBD might change how quickly the liver breaks down these medications. This could change the effects and side effects of these medications. ° °Medications changed by the liver (Cytochrome P450 1B1 (CYP1B1) substrates) °Some medications are changed and broken down by the liver. CBD might change how quickly the liver breaks down these medications. This could change the effects and side effects of these medications. ° °Medications changed by the liver (Cytochrome P450 2A6 (CYP2A6) substrates) °Some medications are changed and broken down by the liver. CBD might change how quickly the liver breaks down these medications. This could change the effects and side effects of these medications. ° °Medications changed by the liver (Cytochrome P450 2B6 (CYP2B6) substrates) °Some medications are changed and broken down by the liver. CBD might change how quickly the liver breaks down these medications. This could change the effects and side effects of these medications. ° °Medications changed by the liver (Cytochrome P450 2C19 (CYP2C19) substrates) °Some medications are changed and broken down by the liver.  CBD might change how quickly the liver breaks down these medications. This could change the effects and side effects of these medications. ° °Medications changed by the liver (Cytochrome P450 2C8 (CYP2C8) substrates) °Some medications are changed and broken down by the liver. CBD might change how quickly the liver breaks down these medications. This could change the effects and side effects of these medications. ° °Medications changed by the liver (Cytochrome P450 2C9 (CYP2C9) substrates) °Some medications are changed and broken down by the liver. CBD might change how quickly the liver breaks down these medications. This could change the effects and side effects of these medications. ° °Medications changed by the liver (Cytochrome P450 2D6 (CYP2D6) substrates) °Some medications are changed and broken down by the liver. CBD might change how quickly the liver breaks down these medications. This could change the effects and side effects of these medications. ° °Medications changed by the liver (Cytochrome P450 2E1 (CYP2E1) substrates) °Some medications are changed and broken down by the liver. CBD might change how quickly the liver breaks down these medications. This could change the effects and side effects of these medications. ° °Medications changed by the liver (Cytochrome P450 3A4 (CYP3A4) substrates) °Some medications are changed and broken down by the liver. CBD might change how quickly the liver breaks down these medications. This could change the effects and side effects of these medications. ° °Medications changed by the liver (Glucuronidated drugs) °Some medications are changed and broken down by the liver. CBD might change how quickly the liver breaks down these medications. This could change the effects and side effects of these medications. ° °Medications that decrease the breakdown of other medications by the liver (Cytochrome P450 2C19 (CYP2C19) inhibitors) °CBD is changed and broken down by the liver.  Some drugs decrease how quickly the liver changes and breaks down CBD. This could change the effects and side effects of CBD. ° °Medications that decrease the breakdown of other medications in the liver (Cytochrome P450 3A4 (CYP3A4) inhibitors) °CBD is changed and broken down by the liver. Some drugs decrease how quickly the liver changes and breaks down CBD. This could change the   effects and side effects of CBD. ° °Medications that increase breakdown of other medications by the liver (Cytochrome P450 3A4 (CYP3A4) inducers) °CBD is changed and broken down by the liver. Some drugs increase how quickly the liver changes and breaks down CBD. This could change the effects and side effects of CBD. ° °Medications that increase the breakdown of other medications by the liver (Cytochrome P450 2C19 (CYP2C19) inducers) °CBD is changed and broken down by the liver. Some drugs increase how quickly the liver changes and breaks down CBD. This could change the effects and side effects of CBD. ° °Methadone (Dolophine) °Methadone is broken down by the liver. CBD might decrease how quickly the liver breaks down methadone. Taking cannabidiol along with methadone might increase the effects and side effects of methadone. ° °Rufinamide (Banzel) °Rufinamide is changed and broken down by the body. CBD might decrease how quickly the body breaks down rufinamide. This might increase levels of rufinamide in the body by a small amount. ° °Sedative medications (CNS depressants) °CBD might cause sleepiness and slowed breathing. Some medications, called sedatives, can also cause sleepiness and slowed breathing. Taking CBD with sedative medications might cause breathing problems and/or too much sleepiness. ° °Sirolimus (Rapamune) °Sirolimus is changed and broken down by the body. CBD might decrease how quickly the body breaks down sirolimus. This might increase levels of sirolimus in the body. ° °Stiripentol (Diacomit) °Stiripentol is changed and  broken down by the body. CBD might decrease how quickly the body breaks down stiripentol. This might increase levels of stiripentol in the body and increase its side effects. ° °Tacrolimus (Prograf) °Tacrolimus is changed and broken down by the body. CBD might decrease how quickly the body breaks down tacrolimus. This might increase levels of tacrolimus in the body. ° °Tamoxifen (Soltamox) °Tamoxifen is changed and broken down by the body. CBD might affect how quickly the body breaks down tamoxifen. This might affect levels of tamoxifen in the body. ° °Topiramate (Topamax) °Topiramate is changed and broken down by the body. CBD might decrease how quickly the body breaks down topiramate. This might increase levels of topiramate in the body by a small amount. ° °Valproate °Valproic acid can cause liver injury. Taking cannabidiol with valproic acid might increase the chance of liver injury. CBD and/or valproic acid might need to be stopped, or the dose might need to be reduced. ° °Warfarin (Coumadin) °CBD might increase levels of warfarin, which can increase the risk for bleeding. CBD and/or warfarin might need to be stopped, or the dose might need to be reduced. ° °Zonisamide °Zonisamide is changed and broken down by the body. CBD might decrease how quickly the body breaks down zonisamide. This might increase levels of zonisamide in the body by a small amount. °(Last update: 04/02/2021) °____________________________________________________________________________________________ ° ____________________________________________________________________________________________ ° °Drug Holidays (Slow) ° °What is a "Drug Holiday"? °Drug Holiday: is the name given to the period of time during which a patient stops taking a medication(s) for the purpose of eliminating tolerance to the drug. ° °Benefits °Improved effectiveness of opioids. °Decreased opioid dose needed to achieve benefits. °Improved pain with lesser  dose. ° °What is tolerance? °Tolerance: is the progressive decreased in effectiveness of a drug due to its repetitive use. With repetitive use, the body gets use to the medication and as a consequence, it loses its effectiveness. This is a common problem seen with opioid pain medications. As a result, a larger dose of the drug is needed to achieve the same effect   that used to be obtained with a smaller dose. ° °How long should a "Drug Holiday" last? °You should stay off of the pain medicine for at least 14 consecutive days. (2 weeks) ° °Should I stop the medicine "cold turkey"? °No. You should always coordinate with your Pain Specialist so that he/she can provide you with the correct medication dose to make the transition as smoothly as possible. ° °How do I stop the medicine? °Slowly. You will be instructed to decrease the daily amount of pills that you take by one (1) pill every seven (7) days. This is called a "slow downward taper" of your dose. For example: if you normally take four (4) pills per day, you will be asked to drop this dose to three (3) pills per day for seven (7) days, then to two (2) pills per day for seven (7) days, then to one (1) per day for seven (7) days, and at the end of those last seven (7) days, this is when the "Drug Holiday" would start.  ° °Will I have withdrawals? °By doing a "slow downward taper" like this one, it is unlikely that you will experience any significant withdrawal symptoms. Typically, what triggers withdrawals is the sudden stop of a high dose opioid therapy. Withdrawals can usually be avoided by slowly decreasing the dose over a prolonged period of time. If you do not follow these instructions and decide to stop your medication abruptly, withdrawals may be possible. ° °What are withdrawals? °Withdrawals: refers to the wide range of symptoms that occur after stopping or dramatically reducing opiate drugs after heavy and prolonged use. Withdrawal symptoms do not occur to  patients that use low dose opioids, or those who take the medication sporadically. Contrary to benzodiazepine (example: Valium, Xanax, etc.) or alcohol withdrawals (“Delirium Tremens”), opioid withdrawals are not lethal. Withdrawals are the physical manifestation of the body getting rid of the excess receptors. ° °Expected Symptoms °Early symptoms of withdrawal may include: °Agitation °Anxiety °Muscle aches °Increased tearing °Insomnia °Runny nose °Sweating °Yawning ° °Late symptoms of withdrawal may include: °Abdominal cramping °Diarrhea °Dilated pupils °Goose bumps °Nausea °Vomiting ° °Will I experience withdrawals? °Due to the slow nature of the taper, it is very unlikely that you will experience any. ° °What is a slow taper? °Taper: refers to the gradual decrease in dose.  °(Last update: 08/23/2019) °____________________________________________________________________________________________ ° °  °

## 2021-04-09 NOTE — Progress Notes (Signed)
Nursing Pain Medication Assessment:  ?Safety precautions to be maintained throughout the outpatient stay will include: orient to surroundings, keep bed in low position, maintain call bell within reach at all times, provide assistance with transfer out of bed and ambulation.  ?Medication Inspection Compliance: Pill count conducted under aseptic conditions, in front of the patient. Neither the pills nor the bottle was removed from the patient's sight at any time. Once count was completed pills were immediately returned to the patient in their original bottle. ? ?Medication: Morphine ER (MSContin) ?Pill/Patch Count:  7 of 60 pills remain ?Pill/Patch Appearance: Markings consistent with prescribed medication ?Bottle Appearance: Standard pharmacy container. Clearly labeled. ?Filled Date: 2 / 3 / 2023 ?Last Medication intake:  TodaySafety precautions to be maintained throughout the outpatient stay will include: orient to surroundings, keep bed in low position, maintain call bell within reach at all times, provide assistance with transfer out of bed and ambulation.  ?

## 2021-04-12 ENCOUNTER — Other Ambulatory Visit: Payer: Self-pay | Admitting: Family

## 2021-04-12 ENCOUNTER — Other Ambulatory Visit: Payer: Self-pay | Admitting: Family Medicine

## 2021-04-12 DIAGNOSIS — G894 Chronic pain syndrome: Secondary | ICD-10-CM

## 2021-04-21 ENCOUNTER — Other Ambulatory Visit: Payer: Self-pay | Admitting: Family Medicine

## 2021-04-23 ENCOUNTER — Encounter: Payer: Medicare Other | Admitting: Pain Medicine

## 2021-04-23 ENCOUNTER — Telehealth: Payer: Medicare Other

## 2021-04-23 ENCOUNTER — Telehealth: Payer: Self-pay

## 2021-04-23 NOTE — Chronic Care Management (AMB) (Signed)
?  Chronic Care Management  ? ?Note ? ?04/23/2021 ?Name: Alan Schmidt MRN: [id number] DOB: 27-Jul-1945 ? ?Alan Schmidt is a 76 y.o. year old male who is a primary care patient of [name], [name], MD. Alan Schmidt is currently enrolled in care management services. An additional referral for RN CM  was placed.  ? ?Follow up plan: ?Patient declines further follow up and engagement by the care management team. Appropriate care team members and provider have been notified via electronic communication.  ? ?Noreene Larsson, RMA ?Care Guide, Embedded Care Coordination ?[facility]  ?[place], [place] [address] ?Direct Dial: [phone] ?[email or web address] ?Website: [email or web address]  ? ?

## 2021-04-24 NOTE — Progress Notes (Signed)
I called and spoke with the patient and he is scheduled for labs on tomorrow.  Alan Schmidt,cma  ?

## 2021-04-25 ENCOUNTER — Other Ambulatory Visit: Payer: Self-pay

## 2021-04-25 ENCOUNTER — Other Ambulatory Visit (INDEPENDENT_AMBULATORY_CARE_PROVIDER_SITE_OTHER): Payer: Medicare Other

## 2021-04-25 DIAGNOSIS — M7989 Other specified soft tissue disorders: Secondary | ICD-10-CM

## 2021-04-25 DIAGNOSIS — E785 Hyperlipidemia, unspecified: Secondary | ICD-10-CM

## 2021-04-25 DIAGNOSIS — E1142 Type 2 diabetes mellitus with diabetic polyneuropathy: Secondary | ICD-10-CM

## 2021-04-25 DIAGNOSIS — Z794 Long term (current) use of insulin: Secondary | ICD-10-CM | POA: Diagnosis not present

## 2021-04-26 LAB — HEMOGLOBIN A1C
Hgb A1c MFr Bld: 8 % of total Hgb — ABNORMAL HIGH (ref <5.7)
Mean Plasma Glucose: 183 mg/dL
eAG (mmol/L): 10.1 mmol/L

## 2021-04-26 LAB — LIPID PANEL
Cholesterol: 154 mg/dL (ref <200)
HDL: 56 mg/dL (ref >=40)
LDL Cholesterol (Calc): 74 mg/dL (calc)
Non-HDL Cholesterol (Calc): 98 mg/dL (calc) (ref <130)
Total CHOL/HDL Ratio: 2.8 (calc) (ref <5.0)
Triglycerides: 154 mg/dL — ABNORMAL HIGH (ref <150)

## 2021-04-26 LAB — CBC
HCT: 44.9 % (ref 38.5–50.0)
Hemoglobin: 14.5 g/dL (ref 13.2–17.1)
MCH: 29.8 pg (ref 27.0–33.0)
MCHC: 32.3 g/dL (ref 32.0–36.0)
MCV: 92.4 fL (ref 80.0–100.0)
MPV: 10 fL (ref 7.5–12.5)
Platelets: 223 10*3/uL (ref 140–400)
RBC: 4.86 10*6/uL (ref 4.20–5.80)
RDW: 12.8 % (ref 11.0–15.0)
WBC: 6.9 10*3/uL (ref 3.8–10.8)

## 2021-04-26 LAB — COMPREHENSIVE METABOLIC PANEL
AG Ratio: 1.4 (calc) (ref 1.0–2.5)
ALT: 23 U/L (ref 9–46)
AST: 17 U/L (ref 10–35)
Albumin: 3.8 g/dL (ref 3.6–5.1)
Alkaline phosphatase (APISO): 78 U/L (ref 35–144)
BUN: 12 mg/dL (ref 7–25)
CO2: 26 mmol/L (ref 20–32)
Calcium: 9.7 mg/dL (ref 8.6–10.3)
Chloride: 101 mmol/L (ref 98–110)
Creat: 1.02 mg/dL (ref 0.70–1.28)
Globulin: 2.8 g/dL (calc) (ref 1.9–3.7)
Glucose, Bld: 200 mg/dL — ABNORMAL HIGH (ref 65–99)
Potassium: 4 mmol/L (ref 3.5–5.3)
Sodium: 140 mmol/L (ref 135–146)
Total Bilirubin: 0.3 mg/dL (ref 0.2–1.2)
Total Protein: 6.6 g/dL (ref 6.1–8.1)

## 2021-04-26 LAB — TSH: TSH: 3.75 mIU/L (ref 0.40–4.50)

## 2021-04-27 DIAGNOSIS — E1165 Type 2 diabetes mellitus with hyperglycemia: Secondary | ICD-10-CM | POA: Diagnosis not present

## 2021-04-29 ENCOUNTER — Other Ambulatory Visit: Payer: Self-pay | Admitting: Family Medicine

## 2021-04-29 DIAGNOSIS — F419 Anxiety disorder, unspecified: Secondary | ICD-10-CM

## 2021-04-30 ENCOUNTER — Ambulatory Visit (INDEPENDENT_AMBULATORY_CARE_PROVIDER_SITE_OTHER): Payer: Medicare Other | Admitting: Family Medicine

## 2021-04-30 ENCOUNTER — Encounter: Payer: Self-pay | Admitting: Family Medicine

## 2021-04-30 DIAGNOSIS — Z794 Long term (current) use of insulin: Secondary | ICD-10-CM | POA: Diagnosis not present

## 2021-04-30 DIAGNOSIS — R29898 Other symptoms and signs involving the musculoskeletal system: Secondary | ICD-10-CM

## 2021-04-30 DIAGNOSIS — R600 Localized edema: Secondary | ICD-10-CM

## 2021-04-30 DIAGNOSIS — E785 Hyperlipidemia, unspecified: Secondary | ICD-10-CM

## 2021-04-30 DIAGNOSIS — E1142 Type 2 diabetes mellitus with diabetic polyneuropathy: Secondary | ICD-10-CM | POA: Diagnosis not present

## 2021-04-30 LAB — GLUCOSE, POCT (MANUAL RESULT ENTRY): POC Glucose: 149 mg/dl — AB (ref 70–99)

## 2021-04-30 MED ORDER — TRESIBA FLEXTOUCH 100 UNIT/ML ~~LOC~~ SOPN: 13.0000 [IU] | PEN_INJECTOR | Freq: Every day | SUBCUTANEOUS | 2 refills | Status: DC

## 2021-04-30 MED ORDER — ROSUVASTATIN CALCIUM 40 MG PO TABS: 40.0000 mg | ORAL_TABLET | Freq: Every day | ORAL | 3 refills | Status: DC

## 2021-04-30 NOTE — Assessment & Plan Note (Signed)
I suspect this is related to venous insufficiency.  He recently had lab work that ruled out anemia, thyroid dysfunction, hepatic dysfunction, and protein deficiency.  Symptoms are not consistent with heart failure.  Discussed elevating his legs. ?

## 2021-04-30 NOTE — Assessment & Plan Note (Addendum)
Poorly controlled.  We will increase his Alan Schmidt to 13 units once daily.  He will continue Humalog 9 units 3 times daily, metformin 1000 mg twice daily, Jardiance 25 mg daily, and Ozempic 2 mg weekly.  Patient noted he felt a little weak coming into the office and wondered if his glucose was low. He has not eaten since this morning.  CBG checked and is reassuring. ?

## 2021-04-30 NOTE — Assessment & Plan Note (Signed)
Not controlled.  We will switch pravastatin to Crestor 40 mg once daily.  He was counseled to monitor for myalgias.  We will recheck labs in 6 weeks. ?

## 2021-04-30 NOTE — Progress Notes (Signed)
?Alan Rumps, MD ?Phone: [phone] ? ?Alan Schmidt is a 76 y.o. male who presents today for follow-up. ? ?DIABETES ?Disease Monitoring: ?Blood Sugar ranges-128-168, excursions to 200s Polyuria/phagia/dipsia-chronic polyuria Optho- UTD ?Medications: ?Compliance- taking Tresiba 12 units once daily, metformin, Humalog 9 units 3 times daily, Jardiance, and Ozempic hypoglycemic symptoms-a couple of times in the last 90 days ? ?HYPERLIPIDEMIA ?Symptoms ?Chest pain on exertion: No   ?Medications: ?Compliance- taking pravastatin Right upper quadrant pain- no  Muscle aches- no persistent muscle aches ?Lipid Panel  ?   ?Component Value Date/Time  ? CHOL 154 04/25/2021 1429  ? CHOL 141 03/22/2020 1053  ? TRIG 154 (H) 04/25/2021 1429  ? HDL 56 04/25/2021 1429  ? HDL 55 03/22/2020 1053  ? CHOLHDL 2.8 04/25/2021 1429  ? VLDL 25.2 10/26/2018 1002  ? [place] 74 04/25/2021 1429  ? LDLDIRECT 53.0 05/15/2019 0920  ? LABVLDL 15 03/22/2020 1053  ? ?Bilateral lower extremity edema: Patient noticed this 3 to 4 weeks ago.  It occurs if he has up for too long or sitting for too long.  No shortness of breath, orthopnea, or PND.  Orthopedics prescribed him some compression stockings though he got them and had a hard time getting them off the left leg so he has not worn them since and notes he had to cut the left one off. ? ?Bilateral leg weakness: Patient notes this is an ongoing issue.  He has been less active and thinks that has contributed.  He has a hard time getting up at times.  He is requesting physical therapy. ? ? ?Social History  ? ?Tobacco Use  ?Smoking Status Former  ? Types: Cigarettes  ?Smokeless Tobacco Never  ? ? ?Current Outpatient Medications on File Prior to Visit  ?Medication Sig Dispense Refill  ? ACCU-CHEK GUIDE test strip USE AS INSTRUCTED DX CODE: E11.9 100 strip 5  ? acetaminophen (TYLENOL) 500 MG tablet Take 500 mg by mouth every 6 (six) hours as needed.    ? ALPRAZolam (XANAX) 1 MG tablet TAKE 0.5  TABLETS BY MOUTH 2 TIMES DAILY AS NEEDED FOR ANXIETY. 30 tablet 0  ? aspirin EC 81 MG tablet Take 81 mg by mouth daily.    ? buPROPion (WELLBUTRIN XL) 150 MG 24 hr tablet TAKE 1 TABLET (150 MG TOTAL) BY MOUTH DAILY FOR 7 DAYS, THEN 2 TABLETS (300 MG TOTAL) DAILY. 180 tablet 1  ? carvedilol (COREG) 25 MG tablet TAKE 1 TABLET BY MOUTH TWICE A DAY 60 tablet 5  ? Continuous Blood Gluc Receiver (FREESTYLE LIBRE 14 DAY READER) DEVI 1 Device by Does not apply route daily. Use to scan to check blood sugar up to 7 times daily; E11.42, 1 Device 1  ? Continuous Blood Gluc Sensor (FREESTYLE LIBRE 14 DAY SENSOR) MISC 1 Device by Does not apply route every 14 (fourteen) days. E11.42 6 each 3  ? desoximetasone (TOPICORT) 0.25 % cream APPLY CREAM TO AFFECTED AREA TWO TIMES DAILY, FOR UP TO 7 DAYS, DO NOT APPLY TO FACE 30 g 0  ? EPINEPHrine (EPIPEN 2-PAK) 0.3 mg/0.3 mL IJ SOAJ injection Inject 0.3 mg into the muscle as needed for anaphylaxis. 2 each 0  ? escitalopram (LEXAPRO) 20 MG tablet TAKE 1 TABLET BY MOUTH EVERY DAY 30 tablet 5  ? gabapentin (NEURONTIN) 250 MG/5ML solution TAKE 6 MLS (300 MG TOTAL) BY MOUTH AT BEDTIME. 180 mL 2  ? hydrochlorothiazide (HYDRODIURIL) 25 MG tablet TAKE 1 TABLET BY MOUTH EVERY DAY 30 tablet 5  ?  insulin lispro (HUMALOG KWIKPEN) 100 UNIT/ML KwikPen Inject 9 Units into the skin 3 (three) times daily. 30 mL 2  ? Insulin Pen Needle (BD PEN NEEDLE NANO U/F) 32G X 4 MM MISC USE EVERY DAY 100 each 4  ? JARDIANCE 25 MG TABS tablet TAKE 1 TABLET BY MOUTH EVERY DAY 30 tablet 3  ? loratadine (CLARITIN) 10 MG tablet TAKE 1 TABLET BY MOUTH EVERY DAY 30 tablet 11  ? lubiprostone (AMITIZA) 8 MCG capsule Take 1 capsule (8 mcg total) by mouth 2 (two) times daily with a meal. Swallow the medication whole. Do not break or chew the medication. 60 capsule 2  ? metFORMIN (GLUCOPHAGE) 1000 MG tablet TAKE 1 TABLET BY MOUTH TWICE A DAY WITH MEALS 60 tablet 5  ? methocarbamol (ROBAXIN) 500 MG tablet Take 500 mg by mouth 2  (two) times daily.    ? mometasone (NASONEX) 50 MCG/ACT nasal spray USE 2 SPRAYS INTO EACH NOSTRIL ONCE DAILY 17 each 3  ? morphine (MS CONTIN) 30 MG 12 hr tablet Take 1 tablet (30 mg total) by mouth every 12 (twelve) hours. Must last 30 days. Do not break tablet 60 tablet 0  ? [START ON 05/11/2021] morphine (MS CONTIN) 30 MG 12 hr tablet Take 1 tablet (30 mg total) by mouth every 12 (twelve) hours. Must last 30 days. Do not break tablet 60 tablet 0  ? [START ON 06/10/2021] morphine (MS CONTIN) 30 MG 12 hr tablet Take 1 tablet (30 mg total) by mouth every 12 (twelve) hours. Must last 30 days. Do not break tablet 60 tablet 0  ? naloxone (NARCAN) 2 MG/2ML injection Inject 1 mL (1 mg total) into the muscle as needed for up to 2 doses (for emergency use only). Always have available. Inject into thigh muscle and call 911 2 mL 1  ? olmesartan (BENICAR) 40 MG tablet TAKE 1 TABLET BY MOUTH EVERY DAY 30 tablet 5  ? pantoprazole (PROTONIX) 40 MG tablet TAKE 1 TABLET BY MOUTH EVERY DAY 30 tablet 5  ? Semaglutide, 2 MG/DOSE, (OZEMPIC, 2 MG/DOSE,) 8 MG/3ML SOPN Inject 2 mg into the skin once a week. 9 mL 3  ? sennosides-docusate sodium (SENOKOT-S) 8.6-50 MG tablet Take 1 tablet by mouth daily as needed.     ? tamsulosin (FLOMAX) 0.4 MG CAPS capsule TAKE 1 CAPSULE BY MOUTH TWICE A DAY 60 capsule 8  ? VICTOZA 18 MG/3ML SOPN INJECT 1.8 MG UNDER THE SKIN ONCE DAILY 9 mL 9  ? Wheat Dextrin (BENEFIBER) POWD Take 6 g by mouth 3 (three) times daily before meals. (2 tsp = 6 g) 730 g 2  ? ?No current facility-administered medications on file prior to visit.  ? ? ? ?ROS see history of present illness ? ?Objective ? ?Physical Exam ?Vitals:  ? 04/30/21 1504  ?BP: 130/80  ?Pulse: 85  ?Temp: 97.7 ?F (36.5 ?C)  ?SpO2: 94%  ? ? ?BP Readings from Last 3 Encounters:  ?04/30/21 130/80  ?04/09/21 116/68  ?01/20/21 131/85  ? ?Wt Readings from Last 3 Encounters:  ?04/30/21 238 lb (108 kg)  ?04/09/21 232 lb (105.2 kg)  ?04/07/21 232 lb (105.2 kg)   ? ? ?Physical Exam ?Constitutional:   ?   General: He is not in acute distress. ?   Appearance: He is not diaphoretic.  ?Cardiovascular:  ?   Rate and Rhythm: Normal rate and regular rhythm.  ?   Heart sounds: Normal heart sounds.  ?Pulmonary:  ?   Effort: Pulmonary effort  is normal.  ?   Breath sounds: Normal breath sounds.  ?Musculoskeletal:  ?   Comments: 1+ pitting edema bilaterally to the mid shin  ?Skin: ?   General: Skin is warm and dry.  ?Neurological:  ?   Mental Status: He is alert.  ?   Comments: 5/5 bilateral quads, hamstrings, plantarflexion, and dorsiflexion  ? ? ? ?Assessment/Plan: Please see individual problem list. ? ?Problem List Items Addressed This Visit   ? ? Diabetes ([place]) (Chronic)  ?  Poorly controlled.  We will increase his Tyler Aas to 13 units once daily.  He will continue Humalog 9 units 3 times daily, metformin 1000 mg twice daily, Jardiance 25 mg daily, and Ozempic 2 mg weekly.  Patient noted he felt a little weak coming into the office and wondered if his glucose was low. He has not eaten since this morning.  CBG checked and is reassuring. ?  ?  ? Relevant Medications  ? rosuvastatin (CRESTOR) 40 MG tablet  ? insulin degludec (TRESIBA FLEXTOUCH) 100 UNIT/ML FlexTouch Pen  ? Other Relevant Orders  ? POCT Glucose (CBG) (Completed)  ? Bilateral leg weakness  ?  I suspect this is related to deconditioning.  We will refer for physical therapy. ?  ?  ? Relevant Orders  ? Ambulatory referral to Physical Therapy  ? Bilateral lower extremity edema  ?  I suspect this is related to venous insufficiency.  He recently had lab work that ruled out anemia, thyroid dysfunction, hepatic dysfunction, and protein deficiency.  Symptoms are not consistent with heart failure.  Discussed elevating his legs. ?  ?  ? Hyperlipidemia  ?  Not controlled.  We will switch pravastatin to Crestor 40 mg once daily.  He was counseled to monitor for myalgias.  We will recheck labs in 6 weeks. ?  ?  ? Relevant Medications   ? rosuvastatin (CRESTOR) 40 MG tablet  ? Other Relevant Orders  ? Direct LDL  ? Hepatic function panel  ? ? ?Return in about 6 weeks (around 06/11/2021) for Labs, 3 months PCP. ? ?This visit occurred during the SARS-

## 2021-04-30 NOTE — Assessment & Plan Note (Signed)
I suspect this is related to deconditioning.  We will refer for physical therapy. ?

## 2021-04-30 NOTE — Patient Instructions (Addendum)
Nice to see you. ?We will switch you to Crestor.  Please discontinue your pravastatin. ?I have referred you for physical therapy. ?Increase your Tyler Aas to 13 units once daily. ?Please try to prop your legs up as much as possible. ?

## 2021-05-06 DIAGNOSIS — M542 Cervicalgia: Secondary | ICD-10-CM | POA: Diagnosis not present

## 2021-05-06 DIAGNOSIS — M5441 Lumbago with sciatica, right side: Secondary | ICD-10-CM | POA: Diagnosis not present

## 2021-05-06 DIAGNOSIS — M5442 Lumbago with sciatica, left side: Secondary | ICD-10-CM | POA: Diagnosis not present

## 2021-05-11 ENCOUNTER — Other Ambulatory Visit: Payer: Self-pay | Admitting: Family Medicine

## 2021-05-14 ENCOUNTER — Other Ambulatory Visit: Payer: Self-pay | Admitting: Family Medicine

## 2021-05-14 DIAGNOSIS — Z794 Long term (current) use of insulin: Secondary | ICD-10-CM

## 2021-05-28 DIAGNOSIS — E1165 Type 2 diabetes mellitus with hyperglycemia: Secondary | ICD-10-CM | POA: Diagnosis not present

## 2021-06-02 ENCOUNTER — Telehealth: Payer: Self-pay | Admitting: Family Medicine

## 2021-06-02 DIAGNOSIS — R29898 Other symptoms and signs involving the musculoskeletal system: Secondary | ICD-10-CM

## 2021-06-02 NOTE — Telephone Encounter (Signed)
Pt called in stating that Dr. [name] referred him to PT... Pt stated that he missed the PT appt because he had a family emergency... Pt stated that he was advised by PT that provider would need to send over another referral for him to started Pt... Pt requesting referral... Pt requesting callback...  ?

## 2021-06-03 NOTE — Telephone Encounter (Signed)
Referral placed.

## 2021-06-14 ENCOUNTER — Other Ambulatory Visit: Payer: Self-pay | Admitting: Family Medicine

## 2021-06-16 ENCOUNTER — Other Ambulatory Visit: Payer: Self-pay | Admitting: *Deleted

## 2021-06-16 NOTE — Patient Outreach (Signed)
[facility]) Care Management ? ?06/16/2021 ? ?[name] ?21-Apr-1945 ?[id number] ? ?Unsuccessful outreach attempt made to patient. RN Health Coach left HIPAA compliant voicemail message along with her contact information. ? ?Plan: ?RN Health Coach will call patient within the month of June. ? ?[name] RN, BSN ?[facility] Care Management  ?RN Health Coach ?[phone] ?[email or web address] ? ? ?

## 2021-06-17 ENCOUNTER — Other Ambulatory Visit (INDEPENDENT_AMBULATORY_CARE_PROVIDER_SITE_OTHER): Payer: Medicare Other

## 2021-06-17 DIAGNOSIS — E785 Hyperlipidemia, unspecified: Secondary | ICD-10-CM

## 2021-06-17 LAB — HEPATIC FUNCTION PANEL
ALT: 26 U/L (ref 0–53)
AST: 18 U/L (ref 0–37)
Albumin: 3.9 g/dL (ref 3.5–5.2)
Alkaline Phosphatase: 71 U/L (ref 39–117)
Bilirubin, Direct: 0.1 mg/dL (ref 0.0–0.3)
Total Bilirubin: 0.3 mg/dL (ref 0.2–1.2)
Total Protein: 6.9 g/dL (ref 6.0–8.3)

## 2021-06-17 LAB — LDL CHOLESTEROL, DIRECT: Direct LDL: 53 mg/dL

## 2021-06-20 ENCOUNTER — Ambulatory Visit: Payer: Medicare Other | Admitting: *Deleted

## 2021-06-25 DIAGNOSIS — M6281 Muscle weakness (generalized): Secondary | ICD-10-CM | POA: Diagnosis not present

## 2021-06-27 DIAGNOSIS — E1165 Type 2 diabetes mellitus with hyperglycemia: Secondary | ICD-10-CM | POA: Diagnosis not present

## 2021-06-30 NOTE — Progress Notes (Unsigned)
PROVIDER NOTE: Information contained herein reflects review and annotations entered in association with encounter. Interpretation of such information and data should be left to medically-trained personnel. Information provided to patient can be located elsewhere in the medical record under "Patient Instructions". Document created using STT-dictation technology, any transcriptional errors that may result from process are unintentional.    Patient: Alan Schmidt  Service Category: E/M  Provider: Gaspar Cola, MD  DOB: [birth year]  DOS: 07/02/2021  Specialty: Interventional Pain Management  MRN: [id number]  Setting: Ambulatory outpatient  PCP: Alan Haven, MD  Type: Established Patient    Referring Provider: [name], MD  Location: Office  Delivery: Face-to-face     HPI  [name], a 76 y.o. year old male, is here today because of his No primary diagnosis found.. [name] primary complain today is No chief complaint on file. Last encounter: My last encounter with him was on 04/09/2021. Pertinent problems: Alan Schmidt has Chronic low back pain (Bilateral) (R>L) w/ sciatica (Bilateral); Lumbar facet syndrome (Bilateral) (R>L); Lumbar spondylosis; Diabetic peripheral neuropathy (Alan Schmidt); Chronic neck pain (midline over the C7 spinous processes) (L>R); Neurogenic pain; Neuropathic pain; Chronic lower extremity pain (2ry area of Pain) (Bilateral) (R>L); 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) (Right L5 dermatome); History of total hip replacement (Right); Chronic hip pain (Right); Abnormal nerve conduction studies (severe bilateral lower extremity polyneuropathy); Chronic shoulder pain (Bilateral) (R>L); Occipital neuralgia (Left); Cervicogenic headache (Left); Chronic shoulder radicular pain (Left); Chronic cervical radicular pain (Left); Chronic pain syndrome; Lumbar facet joint osteoarthritis (Bilateral); Chronic headache; Chronic tension-type headache, intractable;  Coccygodynia; DDD (degenerative disc disease), lumbar; Chronic musculoskeletal pain; Chronic knee pain (Bilateral); Thumb pain (Right); 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 Pain Assessment: Severity of   is reported as a  /10. Location:    / . Onset:  . Quality:  . Timing:  . Modifying factor(s):  Marland Kitchen Vitals:  vitals were not taken for this visit.   Reason for encounter:  *** . ***  Pharmacotherapy Assessment  Analgesic: Morphine ER (MS Contin) 30 mg, 1 tab p.o. twice daily (60 mg/day).  (Previously on oxycodone IR 10 mg every 6 hours (40 mg/day)) MME/day: 60 mg/day.   Monitoring: [place] PMP: PDMP reviewed during this encounter.       Pharmacotherapy: No side-effects or adverse reactions reported. Compliance: No problems identified. Effectiveness: Clinically acceptable.  No notes on file  UDS:  Summary  Date Value Ref Range Status  10/16/2020 Note  Final    Comment:    ==================================================================== [employer] (MW) ==================================================================== Test                             Result       Flag       Units  Drug Present and Declared for Prescription Verification   Alprazolam                     86           EXPECTED   ng/mg creat   Alpha-hydroxyalprazolam        110          EXPECTED   ng/mg creat  Source of alprazolam is a scheduled prescription medication. Alpha-    hydroxyalprazolam is an expected metabolite of alprazolam.    Morphine                       >11905       EXPECTED   ng/mg creat   Normorphine                    571          EXPECTED   ng/mg creat    Potential sources of large  amounts of morphine in the absence of    codeine include administration of morphine or use of heroin.     Normorphine is an expected metabolite of morphine.    Hydromorphone                  237          EXPECTED   ng/mg creat    Hydromorphone may be present as a metabolite of morphine;    concentrations of hydromorphone rarely exceed 5% of the morphine    concentration when this is the source of hydromorphone.  ==================================================================== Test                      Result    Flag   Units      Ref Range   Creatinine              84               mg/dL      >=20 ==================================================================== Declared Medications:  The flagging and interpretation on this report are based on the  following declared medications.  Unexpected results may arise from  inaccuracies in the declared medications.   **Note: The testing scope of this panel includes these medications:   Alprazolam (Xanax)  Morphine (MS Contin)   **Note: The testing scope of this panel does not include the  following reported medications:   Acetaminophen (Tylenol)  Aspirin  Bupropion (Wellbutrin XL)  Carvedilol (Coreg)  Docusate  Empagliflozin (Jardiance)  Epinephrine (EpiPen)  Escitalopram (Lexapro)  Gabapentin (Neurontin)  Hydrochlorothiazide  Insulin Tyler Aas)  Loratadine (Claritin)  Lubiprostone (Amitiza)  Metformin (Glucophage)  Methocarbamol (Robaxin)  Mometasone (Nasonex)  Naloxone (Narcan)  Olmesartan (Benicar)  Pantoprazole (Protonix)  Pravastatin (Pravachol)  Semaglutide (Ozempic)  Sennosides  Supplement (Fiber)  Tamsulosin (Flomax)  Topical ==================================================================== For clinical consultation, please call [phone]. ====================================================================      ROS  Constitutional: Denies any fever or chills Gastrointestinal: No reported  hemesis, hematochezia, vomiting, or acute GI distress Musculoskeletal: Denies any acute onset joint swelling, redness, loss of ROM, or weakness Neurological: No reported episodes of acute onset apraxia, aphasia, dysarthria, agnosia, amnesia, paralysis, loss of coordination, or loss of consciousness  Medication Review  ALPRAZolam, Benefiber, EPINEPHrine, FreeStyle Libre 14 Day Reader, FreeStyle Libre 14 Day Sensor, Insulin Pen Needle, Semaglutide (2 MG/DOSE), acetaminophen, aspirin EC, buPROPion, carvedilol, desoximetasone, empagliflozin, escitalopram, gabapentin, glucose blood, hydrochlorothiazide, insulin degludec, insulin lispro, liraglutide, loratadine, lubiprostone, metFORMIN, methocarbamol, mometasone, morphine, naloxone, olmesartan, pantoprazole, rosuvastatin, sennosides-docusate sodium, and tamsulosin  History Review  Allergy: [name] is allergic to contrast media [iodinated contrast media], iodine, and shellfish allergy. Drug: [name]  reports no history of drug use. Alcohol:  reports no history of alcohol use. Tobacco:  reports that he has quit smoking. His smoking use included cigarettes. He has never used smokeless tobacco. Social: [name]  reports that he has quit smoking. His smoking use included cigarettes. He has never used smokeless tobacco. He reports that he does not drink alcohol and does not use drugs. Medical:  has a past medical history of Acute postoperative pain (11/24/2016), Anxiety, Chronic hip pain (Right) (12/05/2014), Chronic lumbar pain, Depression, Diabetes mellitus without complication ([place]), Hyperlipidemia, Hypertension, Kidney stones, and Migraines. Surgical: [name]  has a past surgical history that includes Total hip arthroplasty; Tonsillectomy; and right hip surgery. Family: family history includes Cancer in his mother; Diabetes in his father, sister, and sister; Heart disease in his father; Stroke in his father.  Laboratory Chemistry Profile    Renal Lab Results  Component Value Date   BUN 12 04/25/2021   CREATININE 1.02 97/98/9211   BCR NOT APPLICABLE 94/17/4081   GFR 76.84 09/30/2020   GFRAA >60 03/26/2018   GFRNONAA >60 03/26/2018    Hepatic Lab Results  Component Value Date   AST 18 06/17/2021   ALT 26 06/17/2021   ALBUMIN 3.9 06/17/2021   ALKPHOS 71 06/17/2021    Electrolytes Lab Results  Component Value Date   NA 140 04/25/2021   K 4.0 04/25/2021   CL 101 04/25/2021   CALCIUM 9.7 04/25/2021   MG 1.8 03/06/2015    Bone No results found for: VD25OH, VD125OH2TOT, KG8185UD1, SH7026VZ8, 25OHVITD1, 25OHVITD2, 25OHVITD3, TESTOFREE, TESTOSTERONE  Inflammation (CRP: Acute Phase) (ESR: Chronic Phase) Lab Results  Component Value Date   CRP 0.5 03/06/2015   ESRSEDRATE 45 (H) 12/14/2016         Note: Above Lab results reviewed.  Recent Imaging Review  CT RENAL STONE STUDY CLINICAL DATA:  Left lower quadrant abdominal pain. History of nephrolithiasis.  EXAM: CT ABDOMEN AND PELVIS WITHOUT CONTRAST  TECHNIQUE: Multidetector CT imaging of the abdomen and pelvis was performed following the standard protocol without IV contrast.  COMPARISON:  10/25/2016 CT abdomen/pelvis.  FINDINGS: Lower chest: No significant pulmonary nodules or acute consolidative airspace disease. Coronary atherosclerosis.  Hepatobiliary: Normal liver size. Scattered punctate granulomatous calcifications in the liver are unchanged. No liver masses. Normal gallbladder with no radiopaque cholelithiasis. No biliary ductal dilatation.  Pancreas: Normal, with no mass or duct dilation.  Spleen: Normal size spleen. Granulomatous splenic calcifications. No splenic masses.  Adrenals/Urinary Tract: Normal adrenals. No renal stones. No hydronephrosis. Simple bilateral renal cysts, largest 3.6 cm in the upper left kidney and 2.9 cm in the posterior interpolar right kidney. Normal caliber ureters. No ureteral stones, noting  poor visualization of the pelvic ureters due to streak artifact from right hip hardware. Bladder obscured by streak artifact. A 3 mm calcification projecting over the posterior bladder just to the right of midline (series 2/image 84), not previously visualized on 10/25/2016 CT, cannot exclude a layering bladder stone.  Stomach/Bowel: Normal non-distended stomach. Normal caliber small bowel with no small bowel wall thickening. Normal appendix. Minimal scattered left colonic diverticulosis with no large bowel wall thickening or significant pericolonic fat stranding.  Vascular/Lymphatic: Atherosclerotic nonaneurysmal abdominal aorta. No pathologically enlarged lymph nodes in the abdomen or pelvis.  Reproductive: Mildly enlarged prostate.  Other: No pneumoperitoneum, ascites or focal fluid collection.  Musculoskeletal: No aggressive appearing focal osseous lesions. Right total hip arthroplasty. Marked lumbar spondylosis.  IMPRESSION: 1. Limited pelvic visualization due to streak artifact from right hip hardware. A 3 mm calcification projecting over the posterior bladder just to the right of midline, not previously visualized on 10/25/2016 CT, cannot exclude a layering bladder stone. No hydronephrosis. No renal collecting system or  ureteral stones. 2. Minimal left colonic diverticulosis. 3. Mildly enlarged prostate. 4. Coronary atherosclerosis. 5. Aortic Atherosclerosis (ICD10-I70.0).  Electronically Signed   By: Ilona Sorrel M.D.   On: 05/30/2020 16:17 Note: Reviewed        Physical Exam  General appearance: Well nourished, well developed, and well hydrated. In no apparent acute distress Mental status: Alert, oriented x 3 (person, place, & time)       Respiratory: No evidence of acute respiratory distress Eyes: PERLA Vitals: There were no vitals taken for this visit. BMI: Estimated body mass index is 31.4 kg/m as calculated from the following:   Height as of 04/30/21: _0   (1.854 m).   Weight as of 04/30/21: 238 lb (108 kg). Ideal: Patient weight not recorded  Assessment   Diagnosis Status  No diagnosis found. Controlled Controlled Controlled   Updated Problems: No problems updated.  Plan of Care  Problem-specific:  No problem-specific Assessment & Plan notes found for this encounter.  [name] has a current medication list which includes the following long-term medication(s): bupropion, carvedilol, escitalopram, gabapentin, humalog kwikpen, hydrochlorothiazide, loratadine, lubiprostone, metformin, mometasone, morphine, morphine, morphine, olmesartan, pantoprazole, rosuvastatin, victoza, and benefiber.  Pharmacotherapy (Medications Ordered): No orders of the defined types were placed in this encounter.  Orders:  No orders of the defined types were placed in this encounter.  Follow-up plan:   No follow-ups on file.     Interventional Therapies  Risk  Complexity Considerations:   Estimated body mass index is 30.61 kg/m as calculated from the following:   Height as of this encounter: _1  (1.854 m).   Weight as of this encounter: 232 lb (105.2 kg). NO MORE RFA - intraoperative noncompliance, unnecessarily increasing radiation exposure NOTE: NO MORE RFA procedures. Difficulty following intra-procedural instructions and commands, resulting in unnecessarily long exposure of staff to radiation.   Planned  Pending:      Under consideration:   Diagnostic Left GONB   Completed:   Therapeutic left lumbar facet RFA x2 (05/09/2019)  Therapeutic right lumbar facet RFA x6 (12/14/2019)  Diagnostic/therapeutic right small joint injection (thumb) x1 (03/15/2018)  Diagnostic/therapeutic left cervical ESI x1 (12/18/2015)    Therapeutic  Palliative (PRN) options:   Palliative lumbar facet MBB  Therapeutic cervical ESI       Recent Visits Date Type Provider Dept  04/09/21 Office Visit Milinda Pointer, MD [facility]   Showing recent visits within past 90 days and meeting all other requirements Future Appointments Date Type Provider Dept  07/02/21 Appointment Milinda Pointer, MD [facility]  Showing future appointments within next 90 days and meeting all other requirements  I discussed the assessment and treatment plan with the patient. The patient was provided an opportunity to ask questions and all were answered. The patient agreed with the plan and demonstrated an understanding of the instructions.  Patient advised to call back or seek an in-person evaluation if the symptoms or condition worsens.  Duration of encounter: *** minutes.  Note by: Alan Cola, MD Date: 07/02/2021; Time: 3:19 PM

## 2021-07-02 ENCOUNTER — Ambulatory Visit: Payer: Medicare Other | Attending: Pain Medicine | Admitting: Pain Medicine

## 2021-07-02 ENCOUNTER — Telehealth: Payer: Self-pay

## 2021-07-02 ENCOUNTER — Encounter: Payer: Self-pay | Admitting: Pain Medicine

## 2021-07-02 VITALS — BP 124/92 | HR 96 | Temp 98.8°F | Resp 18 | Ht 73.0 in | Wt 232.0 lb

## 2021-07-02 DIAGNOSIS — Z79899 Other long term (current) drug therapy: Secondary | ICD-10-CM | POA: Diagnosis not present

## 2021-07-02 DIAGNOSIS — M79605 Pain in left leg: Secondary | ICD-10-CM | POA: Diagnosis not present

## 2021-07-02 DIAGNOSIS — M542 Cervicalgia: Secondary | ICD-10-CM | POA: Insufficient documentation

## 2021-07-02 DIAGNOSIS — M545 Low back pain, unspecified: Secondary | ICD-10-CM | POA: Insufficient documentation

## 2021-07-02 DIAGNOSIS — Z96641 Presence of right artificial hip joint: Secondary | ICD-10-CM | POA: Diagnosis not present

## 2021-07-02 DIAGNOSIS — Z79891 Long term (current) use of opiate analgesic: Secondary | ICD-10-CM | POA: Insufficient documentation

## 2021-07-02 DIAGNOSIS — G894 Chronic pain syndrome: Secondary | ICD-10-CM | POA: Diagnosis not present

## 2021-07-02 DIAGNOSIS — M25551 Pain in right hip: Secondary | ICD-10-CM | POA: Diagnosis not present

## 2021-07-02 DIAGNOSIS — M25562 Pain in left knee: Secondary | ICD-10-CM | POA: Diagnosis not present

## 2021-07-02 DIAGNOSIS — M25561 Pain in right knee: Secondary | ICD-10-CM | POA: Insufficient documentation

## 2021-07-02 DIAGNOSIS — G8929 Other chronic pain: Secondary | ICD-10-CM | POA: Diagnosis not present

## 2021-07-02 DIAGNOSIS — M79604 Pain in right leg: Secondary | ICD-10-CM | POA: Diagnosis not present

## 2021-07-02 DIAGNOSIS — M47816 Spondylosis without myelopathy or radiculopathy, lumbar region: Secondary | ICD-10-CM | POA: Insufficient documentation

## 2021-07-02 MED ORDER — MORPHINE SULFATE ER 30 MG PO TBCR: 30.0000 mg | EXTENDED_RELEASE_TABLET | Freq: Two times a day (BID) | ORAL | 0 refills | Status: DC

## 2021-07-02 MED ORDER — NALOXONE HCL 4 MG/0.1ML NA LIQD: 1.0000 | NASAL | 0 refills | Status: DC | PRN

## 2021-07-02 NOTE — Patient Instructions (Signed)
____________________________________________________________________________________________  Medication Rules  Purpose: To inform patients, and their family members, of our rules and regulations.  Applies to: All patients receiving prescriptions (written or electronic).  Pharmacy of record: Pharmacy where electronic prescriptions will be sent. If written prescriptions are taken to a different pharmacy, please inform the nursing staff. The pharmacy listed in the electronic medical record should be the one where you would like electronic prescriptions to be sent.  Electronic prescriptions: In compliance with the [place] Strengthen Opioid Misuse Prevention (STOP) Act of 2017 (Session Law 2017-74/H243), effective February 02, 2018, all controlled substances must be electronically prescribed. Calling prescriptions to the pharmacy will cease to exist.  Prescription refills: Only during scheduled appointments. Applies to all prescriptions.  NOTE: The following applies primarily to controlled substances (Opioid* Pain Medications).   Type of encounter (visit): For patients receiving controlled substances, face-to-face visits are required. (Not an option or up to the patient.)  Patient's responsibilities: Pain Pills: Bring all pain pills to every appointment (except for procedure appointments). Pill Bottles: Bring pills in original pharmacy bottle. Always bring the newest bottle. Bring bottle, even if empty. Medication refills: You are responsible for knowing and keeping track of what medications you take and those you need refilled. The day before your appointment: write a list of all prescriptions that need to be refilled. The day of the appointment: give the list to the admitting nurse. Prescriptions will be written only during appointments. No prescriptions will be written on procedure days. If you forget a medication: it will not be "Called in", "Faxed", or "electronically sent". You will  need to get another appointment to get these prescribed. No early refills. Do not call asking to have your prescription filled early. Prescription Accuracy: You are responsible for carefully inspecting your prescriptions before leaving our office. Have the discharge nurse carefully go over each prescription with you, before taking them home. Make sure that your name is accurately spelled, that your address is correct. Check the name and dose of your medication to make sure it is accurate. Check the number of pills, and the written instructions to make sure they are clear and accurate. Make sure that you are given enough medication to last until your next medication refill appointment. Taking Medication: Take medication as prescribed. When it comes to controlled substances, taking less pills or less frequently than prescribed is permitted and encouraged. Never take more pills than instructed. Never take medication more frequently than prescribed.  Inform other Doctors: Always inform, all of your healthcare providers, of all the medications you take. Pain Medication from other Providers: You are not allowed to accept any additional pain medication from any other Doctor or Healthcare provider. There are two exceptions to this rule. (see below) In the event that you require additional pain medication, you are responsible for notifying us, as stated below. Cough Medicine: Often these contain an opioid, such as codeine or hydrocodone. Never accept or take cough medicine containing these opioids if you are already taking an opioid* medication. The combination may cause respiratory failure and death. Medication Agreement: You are responsible for carefully reading and following our Medication Agreement. This must be signed before receiving any prescriptions from our practice. Safely store a copy of your signed Agreement. Violations to the Agreement will result in no further prescriptions. (Additional copies of our  Medication Agreement are available upon request.) Laws, Rules, & Regulations: All patients are expected to follow all Federal and State Laws, Statutes, Rules, & Regulations. Ignorance of   the Laws does not constitute a valid excuse.  Illegal drugs and Controlled Substances: The use of illegal substances (including, but not limited to marijuana and its derivatives) and/or the illegal use of any controlled substances is strictly prohibited. Violation of this rule may result in the immediate and permanent discontinuation of any and all prescriptions being written by our practice. The use of any illegal substances is prohibited. Adopted CDC guidelines & recommendations: Target dosing levels will be at or below 60 MME/day. Use of benzodiazepines** is not recommended.  Exceptions: There are only two exceptions to the rule of not receiving pain medications from other Healthcare Providers. Exception #1 (Emergencies): In the event of an emergency (i.e.: accident requiring emergency care), you are allowed to receive additional pain medication. However, you are responsible for: As soon as you are able, call our office [phone], at any time of the day or night, and leave a message stating your name, the date and nature of the emergency, and the name and dose of the medication prescribed. In the event that your call is answered by a member of our staff, make sure to document and save the date, time, and the name of the person that took your information.  Exception #2 (Planned Surgery): In the event that you are scheduled by another doctor or dentist to have any type of surgery or procedure, you are allowed (for a period no longer than 30 days), to receive additional pain medication, for the acute post-op pain. However, in this case, you are responsible for picking up a copy of our "Post-op Pain Management for Surgeons" handout, and giving it to your surgeon or dentist. This document is available at our office, and  does not require an appointment to obtain it. Simply go to our office during business hours (Monday-Thursday from 8:00 AM to 4:00 PM) (Friday 8:00 AM to 12:00 Noon) or if you have a scheduled appointment with us, prior to your surgery, and ask for it by name. In addition, you are responsible for: calling our office [phone], at any time of the day or night, and leaving a message stating your name, name of your surgeon, type of surgery, and date of procedure or surgery. Failure to comply with your responsibilities may result in termination of therapy involving the controlled substances. Medication Agreement Violation. Following the above rules, including your responsibilities will help you in avoiding a Medication Agreement Violation ("Breaking your Pain Medication Contract").  *Opioid medications include: morphine, codeine, oxycodone, oxymorphone, hydrocodone, hydromorphone, meperidine, tramadol, tapentadol, buprenorphine, fentanyl, methadone. **Benzodiazepine medications include: diazepam (Valium), alprazolam (Xanax), clonazepam (Klonopine), lorazepam (Ativan), clorazepate (Tranxene), chlordiazepoxide (Librium), estazolam (Prosom), oxazepam (Serax), temazepam (Restoril), triazolam (Halcion) (Last updated: 10/30/2020) ____________________________________________________________________________________________  ____________________________________________________________________________________________  Medication Recommendations and Reminders  Applies to: All patients receiving prescriptions (written and/or electronic).  Medication Rules & Regulations: These rules and regulations exist for your safety and that of others. They are not flexible and neither are we. Dismissing or ignoring them will be considered "non-compliance" with medication therapy, resulting in complete and irreversible termination of such therapy. (See document titled "Medication Rules" for more details.) In all conscience,  because of safety reasons, we cannot continue providing a therapy where the patient does not follow instructions.  Pharmacy of record:  Definition: This is the pharmacy where your electronic prescriptions will be sent.  We do not endorse any particular pharmacy, however, we have experienced problems with [facility] not securing enough medication supply for the community. We do not restrict you   in your choice of pharmacy. However, once we write for your prescriptions, we will NOT be re-sending more prescriptions to fix restricted supply problems created by your pharmacy, or your insurance.  The pharmacy listed in the electronic medical record should be the one where you want electronic prescriptions to be sent. If you choose to change pharmacy, simply notify our nursing staff.  Recommendations: Keep all of your pain medications in a safe place, under lock and key, even if you live alone. We will NOT replace lost, stolen, or damaged medication. After you fill your prescription, take 1 week's worth of pills and put them away in a safe place. You should keep a separate, properly labeled bottle for this purpose. The remainder should be kept in the original bottle. Use this as your primary supply, until it runs out. Once it's gone, then you know that you have 1 week's worth of medicine, and it is time to come in for a prescription refill. If you do this correctly, it is unlikely that you will ever run out of medicine. To make sure that the above recommendation works, it is very important that you make sure your medication refill appointments are scheduled at least 1 week before you run out of medicine. To do this in an effective manner, make sure that you do not leave the office without scheduling your next medication management appointment. Always ask the nursing staff to show you in your prescription , when your medication will be running out. Then arrange for the receptionist to get you a return appointment,  at least 7 days before you run out of medicine. Do not wait until you have 1 or 2 pills left, to come in. This is very poor planning and does not take into consideration that we may need to cancel appointments due to bad weather, sickness, or emergencies affecting our staff. DO NOT ACCEPT A "Partial Fill": If for any reason your pharmacy does not have enough pills/tablets to completely fill or refill your prescription, do not allow for a "partial fill". The law allows the pharmacy to complete that prescription within 72 hours, without requiring a new prescription. If they do not fill the rest of your prescription within those 72 hours, you will need a separate prescription to fill the remaining amount, which we will NOT provide. If the reason for the partial fill is your insurance, you will need to talk to the pharmacist about payment alternatives for the remaining tablets, but again, DO NOT ACCEPT A PARTIAL FILL, unless you can trust your pharmacist to obtain the remainder of the pills within 72 hours.  Prescription refills and/or changes in medication(s):  Prescription refills, and/or changes in dose or medication, will be conducted only during scheduled medication management appointments. (Applies to both, written and electronic prescriptions.) No refills on procedure days. No medication will be changed or started on procedure days. No changes, adjustments, and/or refills will be conducted on a procedure day. Doing so will interfere with the diagnostic portion of the procedure. No phone refills. No medications will be "called into the pharmacy". No Fax refills. No weekend refills. No Holliday refills. No after hours refills.  Remember:  Business hours are:  Monday to Thursday 8:00 AM to 4:00 PM Provider's Schedule: Carlye Panameno, MD - Appointments are:  Medication management: Monday and Wednesday 8:00 AM to 4:00 PM Procedure day: Tuesday and Thursday 7:30 AM to 4:00 PM Bilal Lateef, MD -  Appointments are:  Medication management: Tuesday and Thursday 8:00   AM to 4:00 PM Procedure day: Monday and Wednesday 7:30 AM to 4:00 PM (Last update: 08/23/2019) ____________________________________________________________________________________________  ____________________________________________________________________________________________  CBD (cannabidiol) & Delta-8 (Delta-8 tetrahydrocannabinol) WARNING  Intro: Cannabidiol (CBD) and tetrahydrocannabinol (THC), are two natural compounds found in plants of the Cannabis genus. They can both be extracted from hemp or cannabis. Hemp and cannabis come from the Cannabis sativa plant. Both compounds interact with your body's endocannabinoid system, but they have very different effects. CBD does not produce the high sensation associated with cannabis. Delta-8 tetrahydrocannabinol, also known as delta-8 THC, is a psychoactive substance found in the Cannabis sativa plant, of which marijuana and hemp are two varieties. THC is responsible for the high associated with the illicit use of marijuana.  Applicable to: All individuals currently taking or considering taking CBD (cannabidiol) and, more important, all patients taking opioid analgesic controlled substances (pain medication). (Example: oxycodone; oxymorphone; hydrocodone; hydromorphone; morphine; methadone; tramadol; tapentadol; fentanyl; buprenorphine; butorphanol; dextromethorphan; meperidine; codeine; etc.)  Legal status: CBD remains a Schedule I drug prohibited for any use. CBD is illegal with one exception. In the [place], CBD has a limited Food and Drug Administration (FDA) approval for the treatment of two specific types of epilepsy disorders. Only one CBD product has been approved by the FDA for this purpose: "Epidiolex". FDA is aware that some companies are marketing products containing cannabis and cannabis-derived compounds in ways that violate the Federal Food, Drug and Cosmetic Act  (FD&C Act) and that may put the health and safety of consumers at risk. The FDA, a Federal agency, has not enforced the CBD status since 2018. UPDATE: (03/21/2021) The Drug Enforcement Agency (DEA) issued a letter stating that "delta" cannabinoids, including Delta-8-THCO and Delta-9-THCO, synthetically derived from hemp do not qualify as hemp and will be viewed as Schedule I drugs. (Schedule I drugs, substances, or chemicals are defined as drugs with no currently accepted medical use and a high potential for abuse. Some examples of Schedule I drugs are: heroin, lysergic acid diethylamide (LSD), marijuana (cannabis), 3,4-methylenedioxymethamphetamine (ecstasy), methaqualone, and peyote.) (https://www.dea.gov)  Legality: Some manufacturers ship CBD products nationally, which is illegal. Often such products are sold online and are therefore available throughout the country. CBD is openly sold in head shops and health food stores in some states where such sales have not been explicitly legalized. Selling unapproved products with unsubstantiated therapeutic claims is not only a violation of the law, but also can put patients at risk, as these products have not been proven to be safe or effective. Federal illegality makes it difficult to conduct research on CBD.  Reference: "FDA Regulation of Cannabis and Cannabis-Derived Products, Including Cannabidiol (CBD)" - https://www.fda.gov/news-events/public-health-focus/fda-regulation-cannabis-and-cannabis-derived-products-including-cannabidiol-cbd  Warning: CBD is not FDA approved and has not undergo the same manufacturing controls as prescription drugs.  This means that the purity and safety of available CBD may be questionable. Most of the time, despite manufacturer's claims, it is contaminated with THC (delta-9-tetrahydrocannabinol - the chemical in marijuana responsible for the "HIGH").  When this is the case, the THC contaminant will trigger a positive urine drug  screen (UDS) test for Marijuana (carboxy-THC). Because a positive UDS for any illicit substance is a violation of our medication agreement, your opioid analgesics (pain medicine) may be permanently discontinued. The FDA recently put out a warning about 5 things that everyone should be aware of regarding Delta-8 THC: Delta-8 THC products have not been evaluated or approved by the FDA for safe use and may be marketed in ways that put the   public health at risk. The FDA has received adverse event reports involving delta-8 THC-containing products. Delta-8 THC has psychoactive and intoxicating effects. Delta-8 THC manufacturing often involve use of potentially harmful chemicals to create the concentrations of delta-8 THC claimed in the marketplace. The final delta-8 THC product may have potentially harmful by-products (contaminants) due to the chemicals used in the process. Manufacturing of delta-8 THC products may occur in uncontrolled or unsanitary settings, which may lead to the presence of unsafe contaminants or other potentially harmful substances. Delta-8 THC products should be kept out of the reach of children and pets.  MORE ABOUT CBD  General Information: CBD was discovered in 1940 and it is a derivative of the cannabis sativa genus plants (Marijuana and Hemp). It is one of the 113 identified substances found in Marijuana. It accounts for up to 40% of the plant's extract. As of 2018, preliminary clinical studies on CBD included research for the treatment of anxiety, movement disorders, and pain. CBD is available and consumed in multiple forms, including inhalation of smoke or vapor, as an aerosol spray, and by mouth. It may be supplied as an oil containing CBD, capsules, dried cannabis, or as a liquid solution. CBD is thought not to be as psychoactive as THC (delta-9-tetrahydrocannabinol - the chemical in marijuana responsible for the "HIGH"). Studies suggest that CBD may interact with different  biological target receptors in the body, including cannabinoid and other neurotransmitter receptors. As of 2018 the mechanism of action for its biological effects has not been determined.  Side-effects  Adverse reactions: Dry mouth, diarrhea, decreased appetite, fatigue, drowsiness, malaise, weakness, sleep disturbances, and others.  Drug interactions: CBC may interact with other medications such as blood-thinners. Because CBD causes drowsiness on its own, it also increases the drowsiness caused by other medications, including antihistamines (such as Benadryl), benzodiazepines (Xanax, Ativan, Valium), antipsychotics, antidepressants and opioids, as well as alcohol and supplements such as kava, melatonin and St. John's Wort. Be cautious with the following combinations:   Brivaracetam (Briviact) Brivaracetam is changed and broken down by the body. CBD might decrease how quickly the body breaks down brivaracetam. This might increase levels of brivaracetam in the body.  Caffeine Caffeine is changed and broken down by the body. CBD might decrease how quickly the body breaks down caffeine. This might increase levels of caffeine in the body.  Carbamazepine (Tegretol) Carbamazepine is changed and broken down by the body. CBD might decrease how quickly the body breaks down carbamazepine. This might increase levels of carbamazepine in the body and increase its side effects.  Citalopram (Celexa) Citalopram is changed and broken down by the body. CBD might decrease how quickly the body breaks down citalopram. This might increase levels of citalopram in the body and increase its side effects.  Clobazam (Onfi) Clobazam is changed and broken down by the liver. CBD might decrease how quickly the liver breaks down clobazam. This might increase the effects and side effects of clobazam.  Eslicarbazepine (Aptiom) Eslicarbazepine is changed and broken down by the body. CBD might decrease how quickly the body  breaks down eslicarbazepine. This might increase levels of eslicarbazepine in the body by a small amount.  Everolimus (Zostress) Everolimus is changed and broken down by the body. CBD might decrease how quickly the body breaks down everolimus. This might increase levels of everolimus in the body.  Lithium Taking higher doses of CBD might increase levels of lithium. This can increase the risk of lithium toxicity.  Medications changed by the liver (  Cytochrome P450 1A1 (CYP1A1) substrates) Some medications are changed and broken down by the liver. CBD might change how quickly the liver breaks down these medications. This could change the effects and side effects of these medications.  Medications changed by the liver (Cytochrome P450 1A2 (CYP1A2) substrates) Some medications are changed and broken down by the liver. CBD might change how quickly the liver breaks down these medications. This could change the effects and side effects of these medications.  Medications changed by the liver (Cytochrome P450 1B1 (CYP1B1) substrates) Some medications are changed and broken down by the liver. CBD might change how quickly the liver breaks down these medications. This could change the effects and side effects of these medications.  Medications changed by the liver (Cytochrome P450 2A6 (CYP2A6) substrates) Some medications are changed and broken down by the liver. CBD might change how quickly the liver breaks down these medications. This could change the effects and side effects of these medications.  Medications changed by the liver (Cytochrome P450 2B6 (CYP2B6) substrates) Some medications are changed and broken down by the liver. CBD might change how quickly the liver breaks down these medications. This could change the effects and side effects of these medications.  Medications changed by the liver (Cytochrome P450 2C19 (CYP2C19) substrates) Some medications are changed and broken down by the liver.  CBD might change how quickly the liver breaks down these medications. This could change the effects and side effects of these medications.  Medications changed by the liver (Cytochrome P450 2C8 (CYP2C8) substrates) Some medications are changed and broken down by the liver. CBD might change how quickly the liver breaks down these medications. This could change the effects and side effects of these medications.  Medications changed by the liver (Cytochrome P450 2C9 (CYP2C9) substrates) Some medications are changed and broken down by the liver. CBD might change how quickly the liver breaks down these medications. This could change the effects and side effects of these medications.  Medications changed by the liver (Cytochrome P450 2D6 (CYP2D6) substrates) Some medications are changed and broken down by the liver. CBD might change how quickly the liver breaks down these medications. This could change the effects and side effects of these medications.  Medications changed by the liver (Cytochrome P450 2E1 (CYP2E1) substrates) Some medications are changed and broken down by the liver. CBD might change how quickly the liver breaks down these medications. This could change the effects and side effects of these medications.  Medications changed by the liver (Cytochrome P450 3A4 (CYP3A4) substrates) Some medications are changed and broken down by the liver. CBD might change how quickly the liver breaks down these medications. This could change the effects and side effects of these medications.  Medications changed by the liver (Glucuronidated drugs) Some medications are changed and broken down by the liver. CBD might change how quickly the liver breaks down these medications. This could change the effects and side effects of these medications.  Medications that decrease the breakdown of other medications by the liver (Cytochrome P450 2C19 (CYP2C19) inhibitors) CBD is changed and broken down by the liver.  Some drugs decrease how quickly the liver changes and breaks down CBD. This could change the effects and side effects of CBD.  Medications that decrease the breakdown of other medications in the liver (Cytochrome P450 3A4 (CYP3A4) inhibitors) CBD is changed and broken down by the liver. Some drugs decrease how quickly the liver changes and breaks down CBD. This could change the effects   and side effects of CBD.  Medications that increase breakdown of other medications by the liver (Cytochrome P450 3A4 (CYP3A4) inducers) CBD is changed and broken down by the liver. Some drugs increase how quickly the liver changes and breaks down CBD. This could change the effects and side effects of CBD.  Medications that increase the breakdown of other medications by the liver (Cytochrome P450 2C19 (CYP2C19) inducers) CBD is changed and broken down by the liver. Some drugs increase how quickly the liver changes and breaks down CBD. This could change the effects and side effects of CBD.  Methadone (Dolophine) Methadone is broken down by the liver. CBD might decrease how quickly the liver breaks down methadone. Taking cannabidiol along with methadone might increase the effects and side effects of methadone.  Rufinamide (Banzel) Rufinamide is changed and broken down by the body. CBD might decrease how quickly the body breaks down rufinamide. This might increase levels of rufinamide in the body by a small amount.  Sedative medications (CNS depressants) CBD might cause sleepiness and slowed breathing. Some medications, called sedatives, can also cause sleepiness and slowed breathing. Taking CBD with sedative medications might cause breathing problems and/or too much sleepiness.  Sirolimus (Rapamune) Sirolimus is changed and broken down by the body. CBD might decrease how quickly the body breaks down sirolimus. This might increase levels of sirolimus in the body.  Stiripentol (Diacomit) Stiripentol is changed and  broken down by the body. CBD might decrease how quickly the body breaks down stiripentol. This might increase levels of stiripentol in the body and increase its side effects.  Tacrolimus (Prograf) Tacrolimus is changed and broken down by the body. CBD might decrease how quickly the body breaks down tacrolimus. This might increase levels of tacrolimus in the body.  Tamoxifen (Soltamox) Tamoxifen is changed and broken down by the body. CBD might affect how quickly the body breaks down tamoxifen. This might affect levels of tamoxifen in the body.  Topiramate (Topamax) Topiramate is changed and broken down by the body. CBD might decrease how quickly the body breaks down topiramate. This might increase levels of topiramate in the body by a small amount.  Valproate Valproic acid can cause liver injury. Taking cannabidiol with valproic acid might increase the chance of liver injury. CBD and/or valproic acid might need to be stopped, or the dose might need to be reduced.  Warfarin (Coumadin) CBD might increase levels of warfarin, which can increase the risk for bleeding. CBD and/or warfarin might need to be stopped, or the dose might need to be reduced.  Zonisamide Zonisamide is changed and broken down by the body. CBD might decrease how quickly the body breaks down zonisamide. This might increase levels of zonisamide in the body by a small amount. (Last update: 04/02/2021) ____________________________________________________________________________________________  ____________________________________________________________________________________________  Drug Holidays (Slow)  What is a "Drug Holiday"? Drug Holiday: is the name given to the period of time during which a patient stops taking a medication(s) for the purpose of eliminating tolerance to the drug.  Benefits Improved effectiveness of opioids. Decreased opioid dose needed to achieve benefits. Improved pain with lesser  dose.  What is tolerance? Tolerance: is the progressive decreased in effectiveness of a drug due to its repetitive use. With repetitive use, the body gets use to the medication and as a consequence, it loses its effectiveness. This is a common problem seen with opioid pain medications. As a result, a larger dose of the drug is needed to achieve the same effect that   used to be obtained with a smaller dose.  How long should a "Drug Holiday" last? You should stay off of the pain medicine for at least 14 consecutive days. (2 weeks)  Should I stop the medicine "cold turkey"? No. You should always coordinate with your Pain Specialist so that he/she can provide you with the correct medication dose to make the transition as smoothly as possible.  How do I stop the medicine? Slowly. You will be instructed to decrease the daily amount of pills that you take by one (1) pill every seven (7) days. This is called a "slow downward taper" of your dose. For example: if you normally take four (4) pills per day, you will be asked to drop this dose to three (3) pills per day for seven (7) days, then to two (2) pills per day for seven (7) days, then to one (1) per day for seven (7) days, and at the end of those last seven (7) days, this is when the "Drug Holiday" would start.   Will I have withdrawals? By doing a "slow downward taper" like this one, it is unlikely that you will experience any significant withdrawal symptoms. Typically, what triggers withdrawals is the sudden stop of a high dose opioid therapy. Withdrawals can usually be avoided by slowly decreasing the dose over a prolonged period of time. If you do not follow these instructions and decide to stop your medication abruptly, withdrawals may be possible.  What are withdrawals? Withdrawals: refers to the wide range of symptoms that occur after stopping or dramatically reducing opiate drugs after heavy and prolonged use. Withdrawal symptoms do not occur to  patients that use low dose opioids, or those who take the medication sporadically. Contrary to benzodiazepine (example: Valium, Xanax, etc.) or alcohol withdrawals ("Delirium Tremens"), opioid withdrawals are not lethal. Withdrawals are the physical manifestation of the body getting rid of the excess receptors.  Expected Symptoms Early symptoms of withdrawal may include: Agitation Anxiety Muscle aches Increased tearing Insomnia Runny nose Sweating Yawning  Late symptoms of withdrawal may include: Abdominal cramping Diarrhea Dilated pupils Goose bumps Nausea Vomiting  Will I experience withdrawals? Due to the slow nature of the taper, it is very unlikely that you will experience any.  What is a slow taper? Taper: refers to the gradual decrease in dose.  (Last update: 08/23/2019) ____________________________________________________________________________________________    

## 2021-07-02 NOTE — Progress Notes (Signed)
Nursing Pain Medication Assessment:  Safety precautions to be maintained throughout the outpatient stay will include: orient to surroundings, keep bed in low position, maintain call bell within reach at all times, provide assistance with transfer out of bed and ambulation.  Medication Inspection Compliance: Pill count conducted under aseptic conditions, in front of the patient. Neither the pills nor the bottle was removed from the patient's sight at any time. Once count was completed pills were immediately returned to the patient in their original bottle.  Medication: Morphine ER (MSContin) Pill/Patch Count:  26 of 60 pills remain Pill/Patch Appearance: Markings consistent with prescribed medication Bottle Appearance: Standard pharmacy container. Clearly labeled. Filled Date: 5 / 19 / 2023 Last Medication intake:  TodaySafety precautions to be maintained throughout the outpatient stay will include: orient to surroundings, keep bed in low position, maintain call bell within reach at all times, provide assistance with transfer out of bed and ambulation.

## 2021-07-03 ENCOUNTER — Other Ambulatory Visit: Payer: Self-pay | Admitting: *Deleted

## 2021-07-03 ENCOUNTER — Other Ambulatory Visit: Payer: Self-pay

## 2021-07-03 DIAGNOSIS — E785 Hyperlipidemia, unspecified: Secondary | ICD-10-CM

## 2021-07-03 DIAGNOSIS — E1142 Type 2 diabetes mellitus with diabetic polyneuropathy: Secondary | ICD-10-CM

## 2021-07-03 DIAGNOSIS — J3089 Other allergic rhinitis: Secondary | ICD-10-CM

## 2021-07-03 DIAGNOSIS — Z794 Long term (current) use of insulin: Secondary | ICD-10-CM

## 2021-07-03 DIAGNOSIS — G894 Chronic pain syndrome: Secondary | ICD-10-CM

## 2021-07-03 MED ORDER — OZEMPIC (2 MG/DOSE) 8 MG/3ML ~~LOC~~ SOPN: 2.0000 mg | PEN_INJECTOR | SUBCUTANEOUS | 3 refills | Status: DC

## 2021-07-03 MED ORDER — GABAPENTIN 250 MG/5ML PO SOLN: ORAL | 2 refills | Status: DC

## 2021-07-03 MED ORDER — ROSUVASTATIN CALCIUM 40 MG PO TABS: 40.0000 mg | ORAL_TABLET | Freq: Every day | ORAL | 3 refills | Status: DC

## 2021-07-03 MED ORDER — INSULIN LISPRO (1 UNIT DIAL) 100 UNIT/ML (KWIKPEN): PEN_INJECTOR | SUBCUTANEOUS | 2 refills | Status: DC

## 2021-07-03 MED ORDER — TRESIBA FLEXTOUCH 100 UNIT/ML ~~LOC~~ SOPN: 13.0000 [IU] | PEN_INJECTOR | Freq: Every day | SUBCUTANEOUS | 2 refills | Status: DC

## 2021-07-03 MED ORDER — LORATADINE 10 MG PO TABS: 10.0000 mg | ORAL_TABLET | Freq: Every day | ORAL | 11 refills | Status: DC

## 2021-07-03 NOTE — Progress Notes (Signed)
I received a fax from [facility] and I called the patient and he confirmed that [facility] stopped doing pill packs and this is where he wants his medications to go to, so I went them to [facility] by [facility].  Aesha Agrawal,cma

## 2021-07-03 NOTE — Patient Instructions (Signed)
Visit Information  Thank you for taking time to visit with me today. Please don't hesitate to contact me if I can be of assistance to you before our next scheduled telephone appointment.  Following are the goals we discussed today:  Take medications as prescribed   Attend all scheduled provider appointments Call provider office for new concerns or questions  schedule appointment with eye doctor check blood sugar at prescribed times: before meals and at bedtime check feet daily for cuts, sores or redness take the blood sugar meter to all doctor visits drink 6 to 8 glasses of water each day fill half of plate with vegetables read food labels for fat, fiber, carbohydrates and portion size check blood pressure 3 times per week write blood pressure results in a log or diary keep a blood pressure log begin an exercise program Use a large mirror to assist with looking at your feet daily; ask your aide for assistance Monitor your blood sugar using Freestyle 3 times daily and B/P 3 times weekly, limit sugar, carbohydrates, and sodium in diet, and contact Dr. [name] for consistently high blood sugars >200 or frequent high B/Ps >150/90 Continue to work with physical therapy; do the exercise at home on your off days   The patient verbalized understanding of instructions, educational materials, and care plan provided today and agreed to receive a mailed copy of patient instructions, educational materials, and care plan.   Telephone follow up appointment with care management team member scheduled for: August  [name] RN, [place] [phone] [email or web address]

## 2021-07-03 NOTE — Patient Outreach (Signed)
[place] [facility]) Care Management  07/03/2021  SAURABH HETTICH 12-11-1945 [id number]  [place] [facility]) Care Management RN Health Coach Note   07/03/2021 Name:  Alan Schmidt MRN:  [id number] DOB:  December 05, 1945  Summary: Patient states he is doing well. He reports that his blood sugar is in better control. His A1c decreased to 8 and his blood sugars are < 200 most of the time. Patient explains that he has begun to work with PT and this has helped with his strength and mobility. Patient's chronic pain is better and he continues to be followed by Dr. Dossie Arbour for his pain control. Patient reports that his aide who assists him with ADLs and IADLs has been a tremendous help and benefit. He states that his home environment is safe and did confirm that he has this nurse's contact number to call her if needed.   Recommendations/Changes made from today's visit: Call provider office for new concerns or questions  Use a large mirror to assist with looking at your feet daily; ask your aide for assistance Monitor your blood sugar using Freestyle 3 times daily and B/P 3 times weekly, limit sugar, carbohydrates, and sodium in diet, and contact Dr. [name] for consistently high blood sugars >200 or frequent high B/Ps >150/90 Continue to work with physical therapy; do the exercise at home on your off days  Subjective: Alan Schmidt is an 76 y.o. year old male who is a primary patient of [name], [name], MD. The care management team was consulted for assistance with care management and/or care coordination needs.    RN Health Coach completed Telephone Visit today.   Objective:  Medications Reviewed Today     Reviewed by Michiel Cowboy, RN (Registered Nurse) on 07/03/21 at 36  Med List Status: <None>   Medication Order Taking? Sig Documenting Provider Last Dose Status Informant  ACCU-CHEK GUIDE test strip [id number] Yes USE AS INSTRUCTED DX CODE: E11.9 [name],  MD Taking Active   acetaminophen (TYLENOL) 500 MG tablet [id number] Yes Take 500 mg by mouth every 6 (six) hours as needed. [provider] Taking Active            Med Note Darnelle Maffucci, [place] Lime   Wed Sep 11, 2020  1:21 PM)    ALPRAZolam Duanne Moron) 1 MG tablet [id number] Yes TAKE 1/2 TABLET BY MOUTH 2 TIMES DAILY AS NEEDED FOR ANXIETY. [name], MD Taking Active   aspirin EC 81 MG tablet [id number] Yes Take 81 mg by mouth daily. [provider] Taking Active   buPROPion (WELLBUTRIN XL) 150 MG 24 hr tablet [id number] Yes TAKE 1 TABLET (150 MG TOTAL) BY MOUTH DAILY FOR 7 DAYS, THEN 2 TABLETS (300 MG TOTAL) DAILY. [name], MD Taking Active   carvedilol (COREG) 25 MG tablet [id number] Yes TAKE 1 TABLET BY MOUTH TWICE A DAY [name] [name], MD Taking Active   Continuous Blood Gluc Receiver (FREESTYLE LIBRE 14 DAY READER) DEVI [id number] Yes 1 Device by Does not apply route daily. Use to scan to check blood sugar up to 7 times daily; E11.42, [name], MD Taking Active   Continuous Blood Gluc Sensor (FREESTYLE LIBRE [place]) [place] [id number] Yes 1 Device by Does not apply route every 14 (fourteen) days. E11.42 [name], MD Taking Active   desoximetasone (TOPICORT) 0.25 % cream [id number] Yes APPLY CREAM TO AFFECTED AREA TWO TIMES DAILY, FOR UP TO 7 DAYS, DO NOT APPLY TO  Benita Stabile, MD Taking Active   EPINEPHrine (EPIPEN 2-PAK) 0.3 mg/0.3 mL IJ SOAJ injection [id number] Yes Inject 0.3 mg into the muscle as needed for anaphylaxis. [name], MD Taking Active   escitalopram (LEXAPRO) 20 MG tablet [id number] Yes TAKE 1 TABLET BY MOUTH EVERY DAY [name], MD Taking Active   gabapentin (NEURONTIN) 250 MG/5ML solution [id number] Yes TAKE 6 MLS (300 MG TOTAL) BY MOUTH AT BEDTIME. Kennyth Arnold, FNP Taking Active   HUMALOG KWIKPEN 100 UNIT/ML KwikPen [id number] Yes INJECT 5-7 UNITS INTO THE SKIN THREE TIMES DAILY WITH A MEAL  [name], MD Taking Active   hydrochlorothiazide (HYDRODIURIL) 25 MG tablet [id number] Yes TAKE 1 TABLET BY MOUTH EVERY DAY [name], MD Taking Active   insulin degludec (TRESIBA FLEXTOUCH) 100 UNIT/ML FlexTouch Pen [id number] Yes Inject 13 Units into the skin daily. [name], MD Taking Active   Insulin Pen Needle (BD PEN NEEDLE NANO U/F) 32G X 4 MM MISC [id number] Yes USE EVERY DAY [name], MD Taking Active   JARDIANCE 25 MG TABS tablet [id number] Yes TAKE 1 TABLET BY MOUTH EVERY DAY [name], MD Taking Active   loratadine (CLARITIN) 10 MG tablet [id number] Yes TAKE 1 TABLET BY MOUTH EVERY DAY [name], MD Taking Active   lubiprostone (AMITIZA) 8 MCG capsule [id number] Yes Take 1 capsule (8 mcg total) by mouth 2 (two) times daily with a meal. Swallow the medication whole. Do not break or chew the medication. Milinda Pointer, MD Taking Active            Med Note Kelby Aline Sep 11, 2020  1:24 PM) Taking PRN  metFORMIN (GLUCOPHAGE) 1000 MG tablet [id number] Yes TAKE 1 TABLET BY MOUTH TWICE A DAY WITH MEALS [name], MD Taking Active   methocarbamol (ROBAXIN) 500 MG tablet [id number] Yes Take 500 mg by mouth 2 (two) times daily. [provider] Taking Active   mometasone (NASONEX) 50 MCG/ACT nasal spray [id number] Yes USE 2 SPRAYS INTO EACH NOSTRIL ONCE DAILY [name], MD Taking Active   morphine (MS CONTIN) 30 MG 12 hr tablet [id number] Yes Take 1 tablet (30 mg total) by mouth every 12 (twelve) hours. Must last 30 days. Do not break tablet Milinda Pointer, MD Taking Active   morphine (MS CONTIN) 30 MG 12 hr tablet [id number] Yes Take 1 tablet (30 mg total) by mouth every 12 (twelve) hours. Must last 30 days. Do not break tablet Milinda Pointer, MD Taking Active   morphine (MS CONTIN) 30 MG 12 hr tablet [id number] Yes Take 1 tablet (30 mg total) by mouth every 12 (twelve) hours. Must last 30 days. Do not  break tablet Milinda Pointer, MD Taking Active            Med Note Dossie Arbour, [place] A   Wed Jul 02, 2021  1:58 PM) WARNING: Not a Duplicate. Future prescription. DO NOT DELETE during hospital medication reconciliation or at discharge. [facility] Chronic Pain Management Patient   naloxone (NARCAN) nasal spray 4 mg/0.1 mL [id number] Yes Place 1 spray into the nose as needed for up to 365 doses (for opioid-induced respiratory depresssion). In case of emergency (overdose), spray once into each nostril. If no response within 3 minutes, repeat application and call [phone]. Milinda Pointer, MD Taking Active   olmesartan [facility]) 40 MG tablet [id number] Yes TAKE 1 TABLET BY MOUTH EVERY DAY [name], MD Taking Active  pantoprazole (PROTONIX) 40 MG tablet [id number] Yes TAKE 1 TABLET BY MOUTH EVERY DAY [name], MD Taking Active   rosuvastatin (CRESTOR) 40 MG tablet [id number] Yes Take 1 tablet (40 mg total) by mouth daily. [name], MD Taking Active   Semaglutide, 2 MG/DOSE, (OZEMPIC, 2 MG/DOSE,) 8 MG/3ML SOPN [id number] Yes Inject 2 mg into the skin once a week. [name], MD Taking Active   sennosides-docusate sodium (SENOKOT-S) 8.6-50 MG tablet [id number] Yes Take 1 tablet by mouth daily as needed.  [provider] Taking Active   tamsulosin (FLOMAX) 0.4 MG CAPS capsule [id number] Yes TAKE 1 CAPSULE BY MOUTH TWICE A DAY [name], MD Taking Active   VICTOZA 18 MG/3ML SOPN [id number] Yes INJECT 1.8 MG UNDER THE SKIN ONCE DAILY Dutch Quint B, FNP Taking Active   Wheat Dextrin [facility]) POWD [id number]  Take 6 g by mouth 3 (three) times daily before meals. (2 tsp = 6 g) Milinda Pointer, MD  Expired 10/16/20 2359   Med List Note Landis Martins, [place] 01/20/21 1459): UDS 10/16/20 MR 04-27-21             SDOH:  (Social Determinants of Health) assessments and interventions performed: SDOH assessments completed today and documented in the Epic  system.  Care Plan  Review of patient past medical history, allergies, medications, health status, including review of consultants reports, laboratory and other test data, was performed as part of comprehensive evaluation for care management services.   Care Plan : RN Care Manager Plan of Care  Updates made by Michiel Cowboy, RN since 07/03/2021 12:00 AM     Problem: Knowledge Deficit Related to Diabetes and Hypertension   Priority: High     Long-Range Goal: Development of Plan of Care for Management of Diabetes and Hypertension   Start Date: 12/25/2020  Expected End Date: 01/01/2022  Priority: High  Note:   Current Barriers:  Knowledge Deficits related to plan of care for management of HTN and DMII   RNCM Clinical Goal(s):  Patient will verbalize understanding of plan for management of HTN and DMII as evidenced by continuation of monitoring blood sugar 2-3 times daily and B/P 3 times weekly, adhering to diabetic and low sodium diet  take all medications exactly as prescribed and will call provider for medication related questions as evidenced by patients verbalization of taking his medication as written and contacting provider for any questions or concerns    demonstrate improved adherence to prescribed treatment plan for HTN and DMII as evidenced by monitoring blood sugar and B/P closely, and adhering to a low sodium diabetic diet   continue to work with Consulting civil engineer and/or Social Worker to address care management and care coordination needs related to HTN and DMII as evidenced by adherence to CM Team Scheduled appointments     through collaboration with Consulting civil engineer, provider, and care team.   Interventions: Inter-disciplinary care team collaboration (see longitudinal plan of care) Evaluation of current treatment plan related to  self management and patient's adherence to plan as established by provider  Diabetes Interventions:  (Status:  Goal on track:  Yes.) Long Term  Goal Assessed patient's understanding of A1c goal: <7% Provided education to patient about basic DM disease process Reviewed medications with patient and discussed importance of medication adherence Discussed plans with patient for ongoing care management follow up and provided patient with direct contact information for care management team Provided patient with written educational materials related to  hypo and hyperglycemia and importance of correct treatment Advised patient, providing education and rationale, to check cbg 3 times daily and record, calling PCP for findings outside established parameters Discussed importance of monitoring blood sugar via CGM frequently, adhering to diabetic diet, and contacting provider for consistent high blood sugar values  Discussed importance of diabetic foot care; encouraged patient to use a mirror to look at feet and to ask his aide to assist Lab Results  Component Value Date   HGBA1C 8.0 (H) 04/25/2021   Hypertension Interventions:  (Status:  Goal on track:  Yes.) Long Term Goal Last practice recorded BP readings:  BP Readings from Last 3 Encounters:  07/02/21 (!) 124/92  04/30/21 130/80  04/09/21 116/68  Most recent eGFR/CrCl: No results found for: EGFR  No components found for: CRCL  Evaluation of current treatment plan related to hypertension self management and patient's adherence to plan as established by provider Reviewed medications with patient and discussed importance of compliance Advised patient, providing education and rationale, to monitor blood pressure daily and record, calling PCP for findings outside established parameters Provided education on prescribed diet low sodium Discussed monitoring B/P 3 times weekly  Encouraged patient to continue to work with PT and to do the exercises at home  Patient Goals/Self-Care Activities: Take medications as prescribed   Attend all scheduled provider appointments Call provider office for new  concerns or questions  schedule appointment with eye doctor check blood sugar at prescribed times: before meals and at bedtime check feet daily for cuts, sores or redness take the blood sugar meter to all doctor visits drink 6 to 8 glasses of water each day fill half of plate with vegetables read food labels for fat, fiber, carbohydrates and portion size check blood pressure 3 times per week write blood pressure results in a log or diary keep a blood pressure log begin an exercise program Use a large mirror to assist with looking at your feet daily; ask your aide for assistance Monitor your blood sugar using Freestyle 3 times daily and B/P 3 times weekly, limit sugar, carbohydrates, and sodium in diet, and contact Dr. [name] for consistently high blood sugars >200 or frequent high B/Ps >150/90 Continue to work with physical therapy; do the exercise at home on your off days           Plan: Telephone follow up appointment with care management team member scheduled for:  August. Nurse will send PCP today's assessment note.   [name] RN, [place] [phone] [email or web address]

## 2021-07-07 ENCOUNTER — Encounter: Payer: Medicare Other | Admitting: Pain Medicine

## 2021-07-09 ENCOUNTER — Other Ambulatory Visit: Payer: Self-pay | Admitting: Family Medicine

## 2021-07-09 DIAGNOSIS — M6281 Muscle weakness (generalized): Secondary | ICD-10-CM | POA: Diagnosis not present

## 2021-07-12 ENCOUNTER — Other Ambulatory Visit: Payer: Self-pay | Admitting: Family Medicine

## 2021-07-12 DIAGNOSIS — Z794 Long term (current) use of insulin: Secondary | ICD-10-CM

## 2021-07-16 ENCOUNTER — Ambulatory Visit: Payer: Self-pay | Admitting: *Deleted

## 2021-07-21 ENCOUNTER — Ambulatory Visit: Payer: Self-pay | Admitting: *Deleted

## 2021-07-23 DIAGNOSIS — M6281 Muscle weakness (generalized): Secondary | ICD-10-CM | POA: Diagnosis not present

## 2021-07-27 DIAGNOSIS — E1165 Type 2 diabetes mellitus with hyperglycemia: Secondary | ICD-10-CM | POA: Diagnosis not present

## 2021-08-06 DIAGNOSIS — M6281 Muscle weakness (generalized): Secondary | ICD-10-CM | POA: Diagnosis not present

## 2021-08-08 DIAGNOSIS — M7918 Myalgia, other site: Secondary | ICD-10-CM | POA: Diagnosis not present

## 2021-08-08 DIAGNOSIS — M542 Cervicalgia: Secondary | ICD-10-CM | POA: Diagnosis not present

## 2021-08-13 ENCOUNTER — Encounter: Payer: Medicare Other | Admitting: Family Medicine

## 2021-08-13 ENCOUNTER — Ambulatory Visit: Payer: Medicare Other | Admitting: Family Medicine

## 2021-08-15 ENCOUNTER — Other Ambulatory Visit: Payer: Self-pay | Admitting: Family Medicine

## 2021-08-19 DIAGNOSIS — M6281 Muscle weakness (generalized): Secondary | ICD-10-CM | POA: Diagnosis not present

## 2021-08-21 ENCOUNTER — Other Ambulatory Visit: Payer: Self-pay | Admitting: *Deleted

## 2021-08-21 NOTE — Patient Outreach (Signed)
[place] [facility]) Care Management  08/21/2021  Alan Schmidt December 16, 1945 [id number]  Unsuccessful outreach attempt made to patient. RN Health Coach left HIPAA compliant voicemail message along with her contact information.  Plan: RN Health Coach will call patient within the month of July.  [name] RN, BSN [place] [phone] [email or web address]

## 2021-08-22 ENCOUNTER — Other Ambulatory Visit: Payer: Self-pay | Admitting: *Deleted

## 2021-08-22 NOTE — Patient Outreach (Signed)
[place] [facility]) Care Management  08/22/2021  Alan Schmidt April 23, 1945 [id number]  Patient has successfully completed his health and wellness goals.   Plan: RN Health Coach will close case.    [name] RN, BSN [place] [phone] [name]

## 2021-08-27 DIAGNOSIS — M6281 Muscle weakness (generalized): Secondary | ICD-10-CM | POA: Diagnosis not present

## 2021-08-27 DIAGNOSIS — E1165 Type 2 diabetes mellitus with hyperglycemia: Secondary | ICD-10-CM | POA: Diagnosis not present

## 2021-08-29 ENCOUNTER — Telehealth: Payer: Medicare Other | Admitting: Family Medicine

## 2021-08-29 DIAGNOSIS — Z794 Long term (current) use of insulin: Secondary | ICD-10-CM | POA: Diagnosis not present

## 2021-08-29 DIAGNOSIS — B351 Tinea unguium: Secondary | ICD-10-CM | POA: Diagnosis not present

## 2021-08-29 DIAGNOSIS — E1140 Type 2 diabetes mellitus with diabetic neuropathy, unspecified: Secondary | ICD-10-CM | POA: Diagnosis not present

## 2021-08-30 ENCOUNTER — Other Ambulatory Visit: Payer: Self-pay | Admitting: Family Medicine

## 2021-08-30 DIAGNOSIS — G894 Chronic pain syndrome: Secondary | ICD-10-CM

## 2021-09-02 ENCOUNTER — Ambulatory Visit (INDEPENDENT_AMBULATORY_CARE_PROVIDER_SITE_OTHER): Payer: Medicare Other | Admitting: Family Medicine

## 2021-09-02 ENCOUNTER — Encounter: Payer: Self-pay | Admitting: Family Medicine

## 2021-09-02 VITALS — BP 120/80 | HR 86 | Temp 97.3°F | Ht 73.0 in | Wt 256.4 lb

## 2021-09-02 DIAGNOSIS — Z794 Long term (current) use of insulin: Secondary | ICD-10-CM

## 2021-09-02 DIAGNOSIS — E1142 Type 2 diabetes mellitus with diabetic polyneuropathy: Secondary | ICD-10-CM

## 2021-09-02 DIAGNOSIS — R195 Other fecal abnormalities: Secondary | ICD-10-CM

## 2021-09-02 DIAGNOSIS — R0789 Other chest pain: Secondary | ICD-10-CM | POA: Insufficient documentation

## 2021-09-02 LAB — POCT GLYCOSYLATED HEMOGLOBIN (HGB A1C): Hemoglobin A1C: 8.5 % — AB (ref 4.0–5.6)

## 2021-09-02 MED ORDER — TIRZEPATIDE 2.5 MG/0.5ML ~~LOC~~ SOAJ: 2.5000 mg | SUBCUTANEOUS | 0 refills | Status: AC

## 2021-09-02 MED ORDER — TIRZEPATIDE 5 MG/0.5ML ~~LOC~~ SOAJ: 5.0000 mg | SUBCUTANEOUS | 1 refills | Status: DC

## 2021-09-02 NOTE — Assessment & Plan Note (Signed)
Refer to GI for further evaluation.  Discussed Cologuard is not an option for evaluation of this.

## 2021-09-02 NOTE — Assessment & Plan Note (Signed)
Patient's chest discomfort is likely musculoskeletal resulting from sleeping on a remote.  It has improved significantly and he has no tenderness today.  If it does not resolve completely over the next week he will let me know.  If it worsens or if he develops any lesions in this area he will let us know as well.

## 2021-09-02 NOTE — Assessment & Plan Note (Addendum)
Poorly controlled.  He will continue Tresiba 13 units daily, Humalog 9 units 3 times daily, metformin 1000 mg twice daily, Jardiance 25 mg daily.  We will change him from Ozempic to [facility].  He will start with 2.5 mg once weekly for 4 weeks and then he will increase to 5 mg once weekly.  He was advised to monitor for similar side effects to the Ozempic.  A1c today. He was advised that he should not be taking the victoza and this should be discontinued.

## 2021-09-02 NOTE — Progress Notes (Signed)
[name], MD Phone: [phone]  Alan Schmidt is a 76 y.o. male who presents today for follow-up.  Diabetes: Patient notes his sugars been up to 250 after eats.  He is on [place], Humalog, Jardiance, metformin, and Ozempic. He has apparently been taking victoza as well.  He wonders about changing the Ozempic to something like Mounjaro.  No polyuria or polydipsia.  No hypoglycemia.  Patient notes he is getting minimal exercise related to chronic pain issues and stability issues which she has done physical therapy for in the past.  Positive FOBT: This was noted last year.  He reports he was quite constipated at the time and thinks that contributed.  He wondered about doing a repeat stool test or Cologuard.  Musculoskeletal chest pain: Patient reports a couple weeks ago he slipped on a remote and since then he has had some soreness in his right pectoral muscle.  He notes it has improved quite a bit.  There is only discomfort when he pushes on the area.  He has chronic pain medication that he takes on a daily basis that he has not taken anything specifically for the pain.  Social History   Tobacco Use  Smoking Status Former   Types: Cigarettes  Smokeless Tobacco Never    Current Outpatient Medications on File Prior to Visit  Medication Sig Dispense Refill   ACCU-CHEK GUIDE test strip USE AS INSTRUCTED DX CODE: E11.9 100 strip 5   acetaminophen (TYLENOL) 500 MG tablet Take 500 mg by mouth every 6 (six) hours as needed.     ALPRAZolam (XANAX) 1 MG tablet TAKE 1/2 TABLET BY MOUTH 2 TIMES DAILY AS NEEDED FOR ANXIETY. 30 tablet 0   aspirin EC 81 MG tablet Take 81 mg by mouth daily.     buPROPion (WELLBUTRIN XL) 150 MG 24 hr tablet TAKE 1 TABLET (150 MG TOTAL) BY MOUTH DAILY FOR 7 DAYS, THEN 2 TABLETS (300 MG TOTAL) DAILY. 180 tablet 1   carvedilol (COREG) 25 MG tablet TAKE 1 TABLET BY MOUTH TWICE A DAY 60 tablet 5   Continuous Blood Gluc Receiver (FREESTYLE LIBRE 14 DAY READER) DEVI 1  Device by Does not apply route daily. Use to scan to check blood sugar up to 7 times daily; E11.42, 1 Device 1   Continuous Blood Gluc Sensor (FREESTYLE LIBRE 14 DAY SENSOR) MISC 1 Device by Does not apply route every 14 (fourteen) days. E11.42 6 each 3   desoximetasone (TOPICORT) 0.25 % cream APPLY CREAM TO AFFECTED AREA TWO TIMES DAILY, FOR UP TO 7 DAYS, DO NOT APPLY TO FACE 30 g 0   EPINEPHrine (EPIPEN 2-PAK) 0.3 mg/0.3 mL IJ SOAJ injection Inject 0.3 mg into the muscle as needed for anaphylaxis. 2 each 0   escitalopram (LEXAPRO) 20 MG tablet TAKE 1 TABLET BY MOUTH EVERY DAY 30 tablet 5   gabapentin (NEURONTIN) 250 MG/5ML solution Take 6 ml by mouth at bedtime. 180 mL 1   hydrochlorothiazide (HYDRODIURIL) 25 MG tablet TAKE 1 TABLET BY MOUTH EVERY DAY 30 tablet 5   insulin degludec (TRESIBA FLEXTOUCH) 100 UNIT/ML FlexTouch Pen Inject 13 Units into the skin daily. 15 mL 2   insulin lispro (HUMALOG KWIKPEN) 100 UNIT/ML KwikPen INJECT 5-7 UNITS INTO THE SKIN THREE TIMES DAILY WITH A MEAL 15 mL 2   Insulin Pen Needle (BD PEN NEEDLE NANO U/F) 32G X 4 MM MISC USE EVERY DAY 100 each 4   JARDIANCE 25 MG TABS tablet TAKE 1 TABLET BY MOUTH EVERY  DAY 30 tablet 3   loratadine (CLARITIN) 10 MG tablet Take 1 tablet (10 mg total) by mouth daily. 30 tablet 11   lubiprostone (AMITIZA) 8 MCG capsule Take 1 capsule (8 mcg total) by mouth 2 (two) times daily with a meal. Swallow the medication whole. Do not break or chew the medication. 60 capsule 2   metFORMIN (GLUCOPHAGE) 1000 MG tablet TAKE 1 TABLET BY MOUTH TWICE A DAY WITH MEALS 60 tablet 5   methocarbamol (ROBAXIN) 500 MG tablet Take 500 mg by mouth 2 (two) times daily.     mometasone (NASONEX) 50 MCG/ACT nasal spray USE 2 SPRAYS INTO EACH NOSTRIL ONCE DAILY 17 each 3   [START ON 09/08/2021] morphine (MS CONTIN) 30 MG 12 hr tablet Take 1 tablet (30 mg total) by mouth every 12 (twelve) hours. Must last 30 days. Do not break tablet 60 tablet 0   morphine (MS  CONTIN) 30 MG 12 hr tablet Take 1 tablet (30 mg total) by mouth every 12 (twelve) hours. Must last 30 days. Do not break tablet 60 tablet 0   naloxone (NARCAN) nasal spray 4 mg/0.1 mL Place 1 spray into the nose as needed for up to 365 doses (for opioid-induced respiratory depresssion). In case of emergency (overdose), spray once into each nostril. If no response within 3 minutes, repeat application and call 417. 1 each 0   olmesartan (BENICAR) 40 MG tablet TAKE 1 TABLET BY MOUTH EVERY DAY 30 tablet 5   pantoprazole (PROTONIX) 40 MG tablet TAKE 1 TABLET BY MOUTH EVERY DAY 30 tablet 5   rosuvastatin (CRESTOR) 40 MG tablet Take 1 tablet (40 mg total) by mouth daily. 90 tablet 3   sennosides-docusate sodium (SENOKOT-S) 8.6-50 MG tablet Take 1 tablet by mouth daily as needed.      tamsulosin (FLOMAX) 0.4 MG CAPS capsule TAKE 1 CAPSULE BY MOUTH TWICE A DAY 60 capsule 8   morphine (MS CONTIN) 30 MG 12 hr tablet Take 1 tablet (30 mg total) by mouth every 12 (twelve) hours. Must last 30 days. Do not break tablet 60 tablet 0   Wheat Dextrin (BENEFIBER) POWD Take 6 g by mouth 3 (three) times daily before meals. (2 tsp = 6 g) 730 g 2   No current facility-administered medications on file prior to visit.     ROS see history of present illness  Objective  Physical Exam Vitals:   09/02/21 1017  BP: 120/80  Pulse: 86  Temp: (!) 97.3 F (36.3 C)  SpO2: 96%    BP Readings from Last 3 Encounters:  09/02/21 120/80  07/02/21 (!) 124/92  04/30/21 130/80   Wt Readings from Last 3 Encounters:  09/02/21 256 lb 6.4 oz (116.3 kg)  07/02/21 232 lb (105.2 kg)  04/30/21 238 lb (108 kg)    Physical Exam Constitutional:      General: He is not in acute distress.    Appearance: He is not diaphoretic.  Cardiovascular:     Rate and Rhythm: Normal rate and regular rhythm.     Heart sounds: Normal heart sounds.  Pulmonary:     Effort: Pulmonary effort is normal.     Breath sounds: Normal breath sounds.   Chest:     Chest wall: No tenderness (No right-sided chest tenderness, no palpable masses in the area of his prior tenderness).  Skin:    General: Skin is warm and dry.  Neurological:     Mental Status: He is alert.  Assessment/Plan: Please see individual problem list.  Problem List Items Addressed This Visit     Class I Morbid obesity (HCC) (68% higher incidence of chronic low back pain) (Chronic)    Encouraged trying to get some exercise and.  We will see if he is able to lose any weight on the [facility].      Relevant Medications   tirzepatide (MOUNJARO) 2.5 MG/0.5ML Pen   tirzepatide (MOUNJARO) 5 MG/0.5ML Pen   Diabetes (HCC) - Primary (Chronic)    Poorly controlled.  He will continue Tresiba 13 units daily, Humalog 9 units 3 times daily, metformin 1000 mg twice daily, Jardiance 25 mg daily.  We will change him from Ozempic to [facility].  He will start with 2.5 mg once weekly for 4 weeks and then he will increase to 5 mg once weekly.  He was advised to monitor for similar side effects to the Ozempic.  A1c today. He was advised that he should not be taking the victoza and this should be discontinued.       Relevant Medications   tirzepatide (MOUNJARO) 2.5 MG/0.5ML Pen   tirzepatide (MOUNJARO) 5 MG/0.5ML Pen   Other Relevant Orders   POCT HgB A1C (Completed)   Musculoskeletal chest pain    Patient's chest discomfort is likely musculoskeletal resulting from sleeping on a remote.  It has improved significantly and he has no tenderness today.  If it does not resolve completely over the next week he will let me know.  If it worsens or if he develops any lesions in this area he will let us know as well.      Positive fecal occult blood test    Refer to GI for further evaluation.  Discussed Cologuard is not an option for evaluation of this.      Relevant Orders   Ambulatory referral to Gastroenterology    Return in about 3 months (around 12/03/2021).   [name],  MD [place]

## 2021-09-02 NOTE — Assessment & Plan Note (Signed)
Encouraged trying to get some exercise and.  We will see if he is able to lose any weight on the [facility].

## 2021-09-02 NOTE — Patient Instructions (Addendum)
Nice to see you. We will switch you over to [facility].  You will stop the Ozempic.  If you are also taking Victoza you need to stop that as well.  If you have nausea or abdominal pain with the [facility] please let us know immediately.  If you develop any other side effects please let us know.  The potential risks and side effects are similar to the Ozempic. You will start with a 2.5 mg dose of Mounjaro.  You will take this for 4 weeks and then at the end of 4 weeks we will increase to the 5 mg dose. GI will contact you to schedule an evaluation for possible colon cancer screening.

## 2021-09-04 ENCOUNTER — Other Ambulatory Visit: Payer: Self-pay

## 2021-09-04 DIAGNOSIS — E1142 Type 2 diabetes mellitus with diabetic polyneuropathy: Secondary | ICD-10-CM

## 2021-09-04 MED ORDER — METFORMIN HCL 1000 MG PO TABS: 1000.0000 mg | ORAL_TABLET | Freq: Two times a day (BID) | ORAL | 5 refills | Status: DC

## 2021-09-04 NOTE — Progress Notes (Signed)
I called cologuard and the order was cancelled.  Genelle Economou,cma

## 2021-09-05 MED ORDER — ESCITALOPRAM OXALATE 20 MG PO TABS: 20.0000 mg | ORAL_TABLET | Freq: Every day | ORAL | 5 refills | Status: DC

## 2021-09-09 ENCOUNTER — Ambulatory Visit: Payer: Medicare Other | Admitting: Family Medicine

## 2021-09-10 DIAGNOSIS — M6281 Muscle weakness (generalized): Secondary | ICD-10-CM | POA: Diagnosis not present

## 2021-09-26 DIAGNOSIS — E1165 Type 2 diabetes mellitus with hyperglycemia: Secondary | ICD-10-CM | POA: Diagnosis not present

## 2021-09-28 NOTE — Progress Notes (Unsigned)
PROVIDER NOTE: Information contained herein reflects review and annotations entered in association with encounter. Interpretation of such information and data should be left to medically-trained personnel. Information provided to patient can be located elsewhere in the medical record under "Patient Instructions". Document created using STT-dictation technology, any transcriptional errors that may result from process are unintentional.    Patient: Alan Schmidt  Service Category: E/M  Provider: Gaspar Cola, MD  DOB: 1945-06-28  DOS: 09/29/2021  Referring Provider: [name], MD  MRN: [id number]  Specialty: Interventional Pain Management  PCP: [name], MD  Type: Established Patient  Setting: Ambulatory outpatient    Location: Office  Delivery: Face-to-face     HPI  [name], a 76 y.o. year old male, is here today because of his No primary diagnosis found.. Alan Schmidt primary complain today is No chief complaint on file. Last encounter: My last encounter with him was on 07/02/2021. Pertinent problems: Alan Schmidt has Chronic low back pain (Bilateral) (R>L) w/ sciatica (Bilateral); Lumbar facet syndrome (Bilateral) (R>L); Lumbar spondylosis; Diabetic peripheral neuropathy ([place]); Chronic neck pain (midline over the C7 spinous processes) (L>R); Neurogenic pain; Neuropathic pain; Chronic lower extremity pain (2ry area of Pain) (Bilateral) (R>L); 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) (Right L5 dermatome); History of total hip replacement (Right); Chronic hip pain (Right); Abnormal nerve conduction studies (severe bilateral lower extremity polyneuropathy); Chronic shoulder pain (Bilateral) (R>L); Occipital neuralgia (Left); Cervicogenic headache (Left); Chronic shoulder radicular pain (Left); Chronic cervical radicular pain (Left); Chronic pain syndrome; Lumbar facet joint osteoarthritis (Bilateral); Chronic headache; Chronic tension-type headache, intractable;  Coccygodynia; DDD (degenerative disc disease), lumbar; Chronic musculoskeletal pain; Chronic knee pain (Bilateral); Thumb pain (Right); 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 Pain Assessment: Severity of   is reported as a  /10. Location:    / . Onset:  . Quality:  . Timing:  . Modifying factor(s):  Marland Kitchen Vitals:  vitals were not taken for this visit.   Reason for encounter:  *** . ***  Pharmacotherapy Assessment  Analgesic: Morphine ER (MS Contin) 30 mg, 1 tab p.o. twice daily (60 mg/day).  (Previously on oxycodone IR 10 mg every 6 hours (40 mg/day)) MME/day: 60 mg/day.   Monitoring: [place] PMP: PDMP reviewed during this encounter.       Pharmacotherapy: No side-effects or adverse reactions reported. Compliance: No problems identified. Effectiveness: Clinically acceptable.  No notes on file  No results found for: "CBDTHCR" No results found for: "D8THCCBX" No results found for: "D9THCCBX"  UDS:  Summary  Date Value Ref Range Status  10/16/2020 Note  Final    Comment:    ==================================================================== [employer] (MW) ==================================================================== Test                             Result       Flag       Units  Drug Present and Declared for Prescription Verification   Alprazolam                     86           EXPECTED   ng/mg creat   Alpha-hydroxyalprazolam  110          EXPECTED   ng/mg creat    Source of alprazolam is a scheduled prescription medication. Alpha-    hydroxyalprazolam is an expected metabolite of alprazolam.    Morphine                       >11905       EXPECTED   ng/mg creat    Normorphine                    571          EXPECTED   ng/mg creat    Potential sources of large amounts of morphine in the absence of    codeine include administration of morphine or use of heroin.     Normorphine is an expected metabolite of morphine.    Hydromorphone                  237          EXPECTED   ng/mg creat    Hydromorphone may be present as a metabolite of morphine;    concentrations of hydromorphone rarely exceed 5% of the morphine    concentration when this is the source of hydromorphone.  ==================================================================== Test                      Result    Flag   Units      Ref Range   Creatinine              84               mg/dL      >=20 ==================================================================== Declared Medications:  The flagging and interpretation on this report are based on the  following declared medications.  Unexpected results may arise from  inaccuracies in the declared medications.   **Note: The testing scope of this panel includes these medications:   Alprazolam (Xanax)  Morphine (MS Contin)   **Note: The testing scope of this panel does not include the  following reported medications:   Acetaminophen (Tylenol)  Aspirin  Bupropion (Wellbutrin XL)  Carvedilol (Coreg)  Docusate  Empagliflozin (Jardiance)  Epinephrine (EpiPen)  Escitalopram (Lexapro)  Gabapentin (Neurontin)  Hydrochlorothiazide  Insulin Tyler Aas)  Loratadine (Claritin)  Lubiprostone (Amitiza)  Metformin (Glucophage)  Methocarbamol (Robaxin)  Mometasone (Nasonex)  Naloxone (Narcan)  Olmesartan (Benicar)  Pantoprazole (Protonix)  Pravastatin (Pravachol)  Semaglutide (Ozempic)  Sennosides  Supplement (Fiber)  Tamsulosin (Flomax)  Topical ==================================================================== For clinical consultation, please call [phone]. ====================================================================       ROS  Constitutional: Denies any fever or chills Gastrointestinal: No reported hemesis, hematochezia, vomiting, or acute GI distress Musculoskeletal: Denies any acute onset joint swelling, redness, loss of ROM, or weakness Neurological: No reported episodes of acute onset apraxia, aphasia, dysarthria, agnosia, amnesia, paralysis, loss of coordination, or loss of consciousness  Medication Review  ALPRAZolam, Benefiber, EPINEPHrine, FreeStyle Libre 14 Day Reader, FreeStyle Libre 14 Day Sensor, Insulin Pen Needle, acetaminophen, aspirin EC, buPROPion, carvedilol, desoximetasone, empagliflozin, escitalopram, gabapentin, glucose blood, hydrochlorothiazide, insulin degludec, insulin lispro, loratadine, lubiprostone, metFORMIN, methocarbamol, mometasone, morphine, naloxone, olmesartan, pantoprazole, rosuvastatin, sennosides-docusate sodium, tamsulosin, and tirzepatide  History Review  Allergy: Alan Schmidt is allergic to contrast media [iodinated contrast media], iodine, and shellfish allergy. Drug: Alan Schmidt  reports no history of drug use. Alcohol:  reports no history of alcohol use. Tobacco:  reports that he has quit  smoking. His smoking use included cigarettes. He has never used smokeless tobacco. Social: Alan Schmidt  reports that he has quit smoking. His smoking use included cigarettes. He has never used smokeless tobacco. He reports that he does not drink alcohol and does not use drugs. Medical:  has a past medical history of Acute postoperative pain (11/24/2016), Anxiety, Chronic hip pain (Right) (12/05/2014), Chronic lumbar pain, Depression, Diabetes mellitus without complication ([place]), Hyperlipidemia, Hypertension, Kidney stones, and Migraines. Surgical: Alan Schmidt  has a past surgical history that includes Total hip arthroplasty; Tonsillectomy; and right hip surgery. Family: family history includes  Cancer in his mother; Diabetes in his father, sister, and sister; Heart disease in his father; Stroke in his father.  Laboratory Chemistry Profile   Renal Lab Results  Component Value Date   BUN 12 04/25/2021   CREATININE 1.02 76/19/5093   BCR NOT APPLICABLE 26/71/2458   GFR 76.84 09/30/2020   GFRAA >60 03/26/2018   GFRNONAA >60 03/26/2018    Hepatic Lab Results  Component Value Date   AST 18 06/17/2021   ALT 26 06/17/2021   ALBUMIN 3.9 06/17/2021   ALKPHOS 71 06/17/2021    Electrolytes Lab Results  Component Value Date   NA 140 04/25/2021   K 4.0 04/25/2021   CL 101 04/25/2021   CALCIUM 9.7 04/25/2021   MG 1.8 03/06/2015    Bone No results found for: "VD25OH", "VD125OH2TOT", "KD9833AS5", "KN3976BH4", "25OHVITD1", "25OHVITD2", "19FXTKWI0", "TESTOFREE", "TESTOSTERONE"  Inflammation (CRP: Acute Phase) (ESR: Chronic Phase) Lab Results  Component Value Date   CRP 0.5 03/06/2015   ESRSEDRATE 45 (H) 12/14/2016         Note: Above Lab results reviewed.  Recent Imaging Review  CT RENAL STONE STUDY CLINICAL DATA:  Left lower quadrant abdominal pain. History of nephrolithiasis.  EXAM: CT ABDOMEN AND PELVIS WITHOUT CONTRAST  TECHNIQUE: Multidetector CT imaging of the abdomen and pelvis was performed following the standard protocol without IV contrast.  COMPARISON:  10/25/2016 CT abdomen/pelvis.  FINDINGS: Lower chest: No significant pulmonary nodules or acute consolidative airspace disease. Coronary atherosclerosis.  Hepatobiliary: Normal liver size. Scattered punctate granulomatous calcifications in the liver are unchanged. No liver masses. Normal gallbladder with no radiopaque cholelithiasis. No biliary ductal dilatation.  Pancreas: Normal, with no mass or duct dilation.  Spleen: Normal size spleen. Granulomatous splenic calcifications. No splenic masses.  Adrenals/Urinary Tract: Normal adrenals. No renal stones. No hydronephrosis. Simple bilateral renal  cysts, largest 3.6 cm in the upper left kidney and 2.9 cm in the posterior interpolar right kidney. Normal caliber ureters. No ureteral stones, noting poor visualization of the pelvic ureters due to streak artifact from right hip hardware. Bladder obscured by streak artifact. A 3 mm calcification projecting over the posterior bladder just to the right of midline (series 2/image 84), not previously visualized on 10/25/2016 CT, cannot exclude a layering bladder stone.  Stomach/Bowel: Normal non-distended stomach. Normal caliber small bowel with no small bowel wall thickening. Normal appendix. Minimal scattered left colonic diverticulosis with no large bowel wall thickening or significant pericolonic fat stranding.  Vascular/Lymphatic: Atherosclerotic nonaneurysmal abdominal aorta. No pathologically enlarged lymph nodes in the abdomen or pelvis.  Reproductive: Mildly enlarged prostate.  Other: No pneumoperitoneum, ascites or focal fluid collection.  Musculoskeletal: No aggressive appearing focal osseous lesions. Right total hip arthroplasty. Marked lumbar spondylosis.  IMPRESSION: 1. Limited pelvic visualization due to streak artifact from right hip hardware. A 3 mm calcification projecting over the posterior bladder just to the right of midline, not previously visualized  on 10/25/2016 CT, cannot exclude a layering bladder stone. No hydronephrosis. No renal collecting system or ureteral stones. 2. Minimal left colonic diverticulosis. 3. Mildly enlarged prostate. 4. Coronary atherosclerosis. 5. Aortic Atherosclerosis (ICD10-I70.0).  Electronically Signed   By: Ilona Sorrel M.D.   On: 05/30/2020 16:17 Note: Reviewed        Physical Exam  General appearance: Well nourished, well developed, and well hydrated. In no apparent acute distress Mental status: Alert, oriented x 3 (person, place, & time)       Respiratory: No evidence of acute respiratory distress Eyes: PERLA Vitals:  There were no vitals taken for this visit. BMI: Estimated body mass index is 33.83 kg/m as calculated from the following:   Height as of 09/02/21: _0  (1.854 m).   Weight as of 09/02/21: 256 lb 6.4 oz (116.3 kg). Ideal: Patient weight not recorded  Assessment   Diagnosis Status  No diagnosis found. Controlled Controlled Controlled   Updated Problems: No problems updated.  Plan of Care  Problem-specific:  No problem-specific Assessment & Plan notes found for this encounter.  Alan Schmidt has a current medication list which includes the following long-term medication(s): bupropion, carvedilol, escitalopram, gabapentin, hydrochlorothiazide, insulin lispro, loratadine, lubiprostone, metformin, mometasone, morphine, morphine, morphine, olmesartan, pantoprazole, rosuvastatin, and benefiber.  Pharmacotherapy (Medications Ordered): No orders of the defined types were placed in this encounter.  Orders:  No orders of the defined types were placed in this encounter.  Follow-up plan:   No follow-ups on file.     Interventional Therapies  Risk  Complexity Considerations:   Estimated body mass index is 30.61 kg/m as calculated from the following:   Height as of this encounter: _1  (1.854 m).   Weight as of this encounter: 232 lb (105.2 kg). NO MORE RFA - intraoperative noncompliance, unnecessarily increasing radiation exposure NOTE: NO MORE RFA procedures. Difficulty following intra-procedural instructions and commands, resulting in unnecessarily long exposure of staff to radiation.   Planned  Pending:      Under consideration:   Diagnostic Left GONB   Completed:   Therapeutic left lumbar facet RFA x2 (05/09/2019)  Therapeutic right lumbar facet RFA x6 (12/14/2019)  Diagnostic/therapeutic right small joint injection (thumb) x1 (03/15/2018)  Diagnostic/therapeutic left cervical ESI x1 (12/18/2015)    Therapeutic  Palliative (PRN) options:   Palliative lumbar facet MBB   Therapeutic cervical ESI      Recent Visits Date Type Provider Dept  07/02/21 Office Visit Milinda Pointer, MD [facility]  Showing recent visits within past 90 days and meeting all other requirements Future Appointments Date Type Provider Dept  09/29/21 Appointment Milinda Pointer, MD [facility]  Showing future appointments within next 90 days and meeting all other requirements  I discussed the assessment and treatment plan with the patient. The patient was provided an opportunity to ask questions and all were answered. The patient agreed with the plan and demonstrated an understanding of the instructions.  Patient advised to call back or seek an in-person evaluation if the symptoms or condition worsens.  Duration of encounter: *** minutes.  Total time on encounter, as per AMA guidelines included both the face-to-face and non-face-to-face time personally spent by the physician and/or other qualified health care professional(s) on the day of the encounter (includes time in activities that require the physician or other qualified health care professional and does not include time in activities normally performed by clinical staff). Physician's time may include the following activities when performed: preparing  to see the patient (eg, review of tests, pre-charting review of records) obtaining and/or reviewing separately obtained history performing a medically appropriate examination and/or evaluation counseling and educating the patient/family/caregiver ordering medications, tests, or procedures referring and communicating with other health care professionals (when not separately reported) documenting clinical information in the electronic or other health record independently interpreting results (not separately reported) and communicating results to the patient/ family/caregiver care coordination (not separately reported)  Note by: Gaspar Cola,  MD Date: 09/29/2021; Time: 8:53 AM

## 2021-09-29 ENCOUNTER — Encounter: Payer: Self-pay | Admitting: Pain Medicine

## 2021-09-29 ENCOUNTER — Ambulatory Visit: Payer: Medicare Other | Attending: Pain Medicine | Admitting: Pain Medicine

## 2021-09-29 VITALS — BP 137/74 | HR 99 | Temp 97.3°F | Resp 18 | Ht 73.0 in | Wt 250.0 lb

## 2021-09-29 DIAGNOSIS — M25562 Pain in left knee: Secondary | ICD-10-CM | POA: Diagnosis not present

## 2021-09-29 DIAGNOSIS — Z79899 Other long term (current) drug therapy: Secondary | ICD-10-CM

## 2021-09-29 DIAGNOSIS — Z96641 Presence of right artificial hip joint: Secondary | ICD-10-CM

## 2021-09-29 DIAGNOSIS — M47816 Spondylosis without myelopathy or radiculopathy, lumbar region: Secondary | ICD-10-CM | POA: Diagnosis not present

## 2021-09-29 DIAGNOSIS — M545 Low back pain, unspecified: Secondary | ICD-10-CM | POA: Diagnosis not present

## 2021-09-29 DIAGNOSIS — G8929 Other chronic pain: Secondary | ICD-10-CM | POA: Diagnosis not present

## 2021-09-29 DIAGNOSIS — Z79891 Long term (current) use of opiate analgesic: Secondary | ICD-10-CM | POA: Diagnosis not present

## 2021-09-29 DIAGNOSIS — M25561 Pain in right knee: Secondary | ICD-10-CM

## 2021-09-29 DIAGNOSIS — M5136 Other intervertebral disc degeneration, lumbar region: Secondary | ICD-10-CM | POA: Diagnosis not present

## 2021-09-29 DIAGNOSIS — M542 Cervicalgia: Secondary | ICD-10-CM | POA: Diagnosis not present

## 2021-09-29 DIAGNOSIS — M79605 Pain in left leg: Secondary | ICD-10-CM | POA: Diagnosis not present

## 2021-09-29 DIAGNOSIS — G894 Chronic pain syndrome: Secondary | ICD-10-CM

## 2021-09-29 DIAGNOSIS — M5030 Other cervical disc degeneration, unspecified cervical region: Secondary | ICD-10-CM

## 2021-09-29 DIAGNOSIS — M25551 Pain in right hip: Secondary | ICD-10-CM

## 2021-09-29 DIAGNOSIS — M79604 Pain in right leg: Secondary | ICD-10-CM | POA: Diagnosis not present

## 2021-09-29 MED ORDER — MORPHINE SULFATE ER 30 MG PO TBCR: 30.0000 mg | EXTENDED_RELEASE_TABLET | Freq: Two times a day (BID) | ORAL | 0 refills | Status: DC

## 2021-09-29 NOTE — Patient Instructions (Signed)

## 2021-09-29 NOTE — Progress Notes (Signed)
Nursing Pain Medication Assessment:  Safety precautions to be maintained throughout the outpatient stay will include: orient to surroundings, keep bed in low position, maintain call bell within reach at all times, provide assistance with transfer out of bed and ambulation.  Medication Inspection Compliance: Pill count conducted under aseptic conditions, in front of the patient. Neither the pills nor the bottle was removed from the patient's sight at any time. Once count was completed pills were immediately returned to the patient in their original bottle.  Medication: Morphine ER (MSContin) Pill/Patch Count:  25 of 60 pills remain Pill/Patch Appearance: Markings consistent with prescribed medication Bottle Appearance: Standard pharmacy container. Clearly labeled. Filled Date: 08 / 10 / 2023 Last Medication intake:  Today

## 2021-10-01 ENCOUNTER — Ambulatory Visit: Payer: Medicare Other | Admitting: *Deleted

## 2021-10-02 LAB — TOXASSURE SELECT 13 (MW), URINE

## 2021-10-10 ENCOUNTER — Telehealth: Payer: Self-pay

## 2021-10-10 NOTE — Telephone Encounter (Signed)
Returned patients call to schedule his colonoscopy.  Unable to LVM because there was no answer.  Thanks,  [place], [place]

## 2021-10-12 ENCOUNTER — Other Ambulatory Visit: Payer: Self-pay | Admitting: Family Medicine

## 2021-10-12 ENCOUNTER — Other Ambulatory Visit: Payer: Self-pay | Admitting: Family

## 2021-10-12 DIAGNOSIS — E1142 Type 2 diabetes mellitus with diabetic polyneuropathy: Secondary | ICD-10-CM

## 2021-10-13 ENCOUNTER — Telehealth: Payer: Self-pay

## 2021-10-13 NOTE — Telephone Encounter (Signed)
Patient states he needs a refill for his ALPRAZolam (XANAX) 1 MG tablet.  *[facility] states [facility] [address] (not [facility] [employer]).

## 2021-10-14 MED ORDER — ALPRAZOLAM 1 MG PO TABS: ORAL_TABLET | ORAL | 0 refills | Status: DC

## 2021-10-14 NOTE — Telephone Encounter (Signed)
Sent to pharmacy 

## 2021-10-23 ENCOUNTER — Telehealth: Payer: Self-pay

## 2021-10-23 ENCOUNTER — Other Ambulatory Visit: Payer: Self-pay

## 2021-10-23 DIAGNOSIS — R195 Other fecal abnormalities: Secondary | ICD-10-CM

## 2021-10-23 DIAGNOSIS — Z1211 Encounter for screening for malignant neoplasm of colon: Secondary | ICD-10-CM

## 2021-10-23 MED ORDER — NA SULFATE-K SULFATE-MG SULF 17.5-3.13-1.6 GM/177ML PO SOLN: 1.0000 | Freq: Once | ORAL | 0 refills | Status: AC

## 2021-10-23 NOTE — Telephone Encounter (Signed)
Gastroenterology Pre-Procedure Review  Request Date: 11/18/21 Requesting Physician: Dr. Vicente Males  PATIENT REVIEW QUESTIONS: The patient responded to the following health history questions as indicated:    1. Are you having any GI issues? no, however patient was referred to [place] for Positive Fecal Occult.  No GI issues noticed by patient. First colonoscopy as well. 2. Do you have a personal history of Polyps? no 3. Do you have a family history of Colon Cancer or Polyps? no 4. Diabetes Mellitus? yes (Advised to hold Jardiance 3 days prior to colonoscopy, hold metformin 2 days before procedure, take half the usual dose  of insulin the day before, Mounjaro hold 7 days prior to colonoscopy) 5. Joint replacements in the past 12 months?no 6. Major health problems in the past 3 months?no 7. Any artificial heart valves, MVP, or defibrillator?no    MEDICATIONS & ALLERGIES:    Patient reports the following regarding taking any anticoagulation/antiplatelet therapy:   Plavix, Coumadin, Eliquis, Xarelto, Lovenox, Pradaxa, Brilinta, or Effient? no Aspirin? yes (81 mg daily)  Patient confirms/reports the following medications:  Current Outpatient Medications  Medication Sig Dispense Refill   ACCU-CHEK GUIDE test strip USE AS INSTRUCTED DX CODE: E11.9 100 strip 5   acetaminophen (TYLENOL) 500 MG tablet Take 500 mg by mouth every 6 (six) hours as needed.     ALPRAZolam (XANAX) 1 MG tablet TAKE 1/2 TABLET BY MOUTH 2 TIMES DAILY AS NEEDED FOR ANXIETY. 30 tablet 0   aspirin EC 81 MG tablet Take 81 mg by mouth daily.     buPROPion (WELLBUTRIN XL) 150 MG 24 hr tablet TAKE 1 TABLET (150 MG TOTAL) BY MOUTH DAILY FOR 7 DAYS, THEN 2 TABLETS (300 MG TOTAL) DAILY. 180 tablet 1   carvedilol (COREG) 25 MG tablet TAKE 1 TABLET BY MOUTH TWICE A DAY 60 tablet 5   Continuous Blood Gluc Receiver (FREESTYLE LIBRE 14 DAY READER) DEVI 1 Device by Does not apply route daily. Use to scan to check blood sugar up to 7 times daily;  E11.42, 1 Device 1   Continuous Blood Gluc Sensor (FREESTYLE LIBRE 14 DAY SENSOR) MISC 1 Device by Does not apply route every 14 (fourteen) days. E11.42 6 each 3   desoximetasone (TOPICORT) 0.25 % cream APPLY CREAM TO AFFECTED AREA TWO TIMES DAILY, FOR UP TO 7 DAYS, DO NOT APPLY TO FACE 30 g 0   EPINEPHrine (EPIPEN 2-PAK) 0.3 mg/0.3 mL IJ SOAJ injection Inject 0.3 mg into the muscle as needed for anaphylaxis. 2 each 0   escitalopram (LEXAPRO) 20 MG tablet Take 1 tablet (20 mg total) by mouth daily. 30 tablet 5   gabapentin (NEURONTIN) 250 MG/5ML solution Take 6 ml by mouth at bedtime. 180 mL 1   hydrochlorothiazide (HYDRODIURIL) 25 MG tablet TAKE 1 TABLET BY MOUTH EVERY DAY 30 tablet 5   insulin lispro (HUMALOG KWIKPEN) 100 UNIT/ML KwikPen INJECT 5-7 UNITS INTO THE SKIN THREE TIMES DAILY WITH A MEAL 15 mL 2   Insulin Pen Needle (BD PEN NEEDLE NANO U/F) 32G X 4 MM MISC USE EVERY DAY 100 each 4   JARDIANCE 25 MG TABS tablet TAKE 1 TABLET BY MOUTH EVERY DAY 30 tablet 3   loratadine (CLARITIN) 10 MG tablet Take 1 tablet (10 mg total) by mouth daily. 30 tablet 11   lubiprostone (AMITIZA) 8 MCG capsule Take 1 capsule (8 mcg total) by mouth 2 (two) times daily with a meal. Swallow the medication whole. Do not break or chew the medication. 60 capsule  2   metFORMIN (GLUCOPHAGE) 1000 MG tablet Take 1 tablet (1,000 mg total) by mouth 2 (two) times daily with a meal. 60 tablet 5   methocarbamol (ROBAXIN) 500 MG tablet Take 500 mg by mouth 2 (two) times daily.     mometasone (NASONEX) 50 MCG/ACT nasal spray USE 2 SPRAYS INTO EACH NOSTRIL ONCE DAILY 17 each 3   [START ON 12/07/2021] morphine (MS CONTIN) 30 MG 12 hr tablet Take 1 tablet (30 mg total) by mouth every 12 (twelve) hours. Must last 30 days. Do not break tablet 60 tablet 0   morphine (MS CONTIN) 30 MG 12 hr tablet Take 1 tablet (30 mg total) by mouth every 12 (twelve) hours. Must last 30 days. Do not break tablet 60 tablet 0   [START ON 11/07/2021]  morphine (MS CONTIN) 30 MG 12 hr tablet Take 1 tablet (30 mg total) by mouth every 12 (twelve) hours. Must last 30 days. Do not break tablet 60 tablet 0   naloxone (NARCAN) nasal spray 4 mg/0.1 mL Place 1 spray into the nose as needed for up to 365 doses (for opioid-induced respiratory depresssion). In case of emergency (overdose), spray once into each nostril. If no response within 3 minutes, repeat application and call 557. 1 each 0   olmesartan (BENICAR) 40 MG tablet TAKE 1 TABLET BY MOUTH EVERY DAY 30 tablet 5   pantoprazole (PROTONIX) 40 MG tablet TAKE 1 TABLET BY MOUTH EVERY DAY 30 tablet 5   rosuvastatin (CRESTOR) 40 MG tablet Take 1 tablet (40 mg total) by mouth daily. 90 tablet 3   sennosides-docusate sodium (SENOKOT-S) 8.6-50 MG tablet Take 1 tablet by mouth daily as needed.      tamsulosin (FLOMAX) 0.4 MG CAPS capsule TAKE 1 CAPSULE BY MOUTH TWICE A DAY 60 capsule 8   tirzepatide (MOUNJARO) 5 MG/0.5ML Pen Inject 5 mg into the skin once a week. Start after you complete the 4 weeks of the 2.5 mg dose 6 mL 1   TRESIBA FLEXTOUCH 100 UNIT/ML FlexTouch Pen INJECT 18 UNITS INTO THE SKIN DAILY AT 10 PM. 15 mL 3   Wheat Dextrin (BENEFIBER) POWD Take 6 g by mouth 3 (three) times daily before meals. (2 tsp = 6 g) 730 g 2   No current facility-administered medications for this visit.    Patient confirms/reports the following allergies:  Allergies  Allergen Reactions   Contrast Media [Iodinated Contrast Media] Swelling   Iodine Swelling   Shellfish Allergy Nausea And Vomiting and Swelling    No orders of the defined types were placed in this encounter.   AUTHORIZATION INFORMATION Primary Insurance: 1D#: Group #:  Secondary Insurance: 1D#: Group #:  SCHEDULE INFORMATION: Date: 11/18/21 Time: Location: [facility]

## 2021-10-26 DIAGNOSIS — E1165 Type 2 diabetes mellitus with hyperglycemia: Secondary | ICD-10-CM | POA: Diagnosis not present

## 2021-11-04 ENCOUNTER — Other Ambulatory Visit: Payer: Self-pay

## 2021-11-04 ENCOUNTER — Telehealth: Payer: Self-pay

## 2021-11-04 DIAGNOSIS — E1142 Type 2 diabetes mellitus with diabetic polyneuropathy: Secondary | ICD-10-CM

## 2021-11-04 DIAGNOSIS — G894 Chronic pain syndrome: Secondary | ICD-10-CM

## 2021-11-04 DIAGNOSIS — I10 Essential (primary) hypertension: Secondary | ICD-10-CM

## 2021-11-04 DIAGNOSIS — K219 Gastro-esophageal reflux disease without esophagitis: Secondary | ICD-10-CM

## 2021-11-04 DIAGNOSIS — N401 Enlarged prostate with lower urinary tract symptoms: Secondary | ICD-10-CM

## 2021-11-04 MED ORDER — OLMESARTAN MEDOXOMIL 40 MG PO TABS: 40.0000 mg | ORAL_TABLET | Freq: Every day | ORAL | 1 refills | Status: DC

## 2021-11-04 MED ORDER — PANTOPRAZOLE SODIUM 40 MG PO TBEC: 40.0000 mg | DELAYED_RELEASE_TABLET | Freq: Every day | ORAL | 1 refills | Status: DC

## 2021-11-04 MED ORDER — CARVEDILOL 25 MG PO TABS: 25.0000 mg | ORAL_TABLET | Freq: Two times a day (BID) | ORAL | 0 refills | Status: DC

## 2021-11-04 MED ORDER — TAMSULOSIN HCL 0.4 MG PO CAPS: 0.4000 mg | ORAL_CAPSULE | Freq: Two times a day (BID) | ORAL | 1 refills | Status: DC

## 2021-11-04 MED ORDER — EMPAGLIFLOZIN 25 MG PO TABS: 25.0000 mg | ORAL_TABLET | Freq: Every day | ORAL | 3 refills | Status: DC

## 2021-11-04 MED ORDER — HYDROCHLOROTHIAZIDE 25 MG PO TABS: 25.0000 mg | ORAL_TABLET | Freq: Every day | ORAL | 1 refills | Status: DC

## 2021-11-04 MED ORDER — GABAPENTIN 250 MG/5ML PO SOLN: ORAL | 1 refills | Status: AC

## 2021-11-04 NOTE — Telephone Encounter (Signed)
Patient changed pharmacy he is now using [facility]  and I received a fax for 6 of his medications and they were sent electronically to pill pack today.  Rohil Lesch,cma

## 2021-11-09 ENCOUNTER — Other Ambulatory Visit: Payer: Self-pay | Admitting: Family Medicine

## 2021-11-09 DIAGNOSIS — F419 Anxiety disorder, unspecified: Secondary | ICD-10-CM

## 2021-11-12 ENCOUNTER — Other Ambulatory Visit: Payer: Self-pay | Admitting: Family Medicine

## 2021-11-18 ENCOUNTER — Encounter: Payer: Self-pay | Admitting: Anesthesiology

## 2021-11-18 ENCOUNTER — Encounter: Admission: RE | Payer: Self-pay | Source: Ambulatory Visit

## 2021-11-18 ENCOUNTER — Ambulatory Visit: Admission: RE | Admit: 2021-11-18 | Payer: Medicare Other | Source: Ambulatory Visit | Admitting: Gastroenterology

## 2021-11-18 SURGERY — COLONOSCOPY WITH PROPOFOL
Anesthesia: General

## 2021-11-18 NOTE — Anesthesia Preprocedure Evaluation (Deleted)
Anesthesia Evaluation    Airway        Dental   Pulmonary former smoker,           Cardiovascular hypertension,      Neuro/Psych  Headaches, PSYCHIATRIC DISORDERS Anxiety Depression Chronic pain  Neuromuscular disease (diabetic polyneuropathy)    GI/Hepatic GERD  ,  Endo/Other  diabetes, Type 2, Insulin Dependent  Renal/GU Renal disease (nephrolithiasis)   BPH    Musculoskeletal   Abdominal   Peds  Hematology   Anesthesia Other Findings   Reproductive/Obstetrics                            Anesthesia Physical Anesthesia Plan  ASA: 3  Anesthesia Plan: General   Post-op Pain Management:    Induction: Intravenous  PONV Risk Score and Plan: 2 and Propofol infusion, TIVA and Treatment may vary due to age or medical condition  Airway Management Planned: Natural Airway  Additional Equipment:   Intra-op Plan:   Post-operative Plan:   Informed Consent:   Plan Discussed with:   Anesthesia Plan Comments: (Patient did not show for procedure on 11/18/21.)       Anesthesia Quick Evaluation

## 2021-11-20 ENCOUNTER — Other Ambulatory Visit: Payer: Self-pay | Admitting: Family Medicine

## 2021-11-21 ENCOUNTER — Telehealth: Payer: Self-pay

## 2021-11-21 MED ORDER — ALPRAZOLAM 1 MG PO TABS: ORAL_TABLET | ORAL | 0 refills | Status: DC

## 2021-11-21 NOTE — Telephone Encounter (Signed)
Patient states he needs a refill for his ALPRAZolam (XANAX) 1 MG tablet.  Patient states he is out of this medication.  *[facility] [address] [facility] [employer]).

## 2021-11-21 NOTE — Telephone Encounter (Signed)
He should not be out of medication. It looks like he picked it up on 10/11. Given that we are heading in to the weekend I will send in a small amount to cover for the weekend until someone can call him and clarify what happened with the prescription he picked up on 10/11.

## 2021-11-25 DIAGNOSIS — E1165 Type 2 diabetes mellitus with hyperglycemia: Secondary | ICD-10-CM | POA: Diagnosis not present

## 2021-11-25 NOTE — Telephone Encounter (Signed)
LVM for patient to call back.   Vivaan Helseth,cma  

## 2021-11-26 ENCOUNTER — Other Ambulatory Visit: Payer: Self-pay | Admitting: Family

## 2021-11-26 MED ORDER — ALPRAZOLAM 1 MG PO TABS: ORAL_TABLET | ORAL | 0 refills | Status: DC

## 2021-11-26 NOTE — Telephone Encounter (Signed)
Pt called back and he stated they did not give him the six pills because they filled them on time. They did not give  him any extras

## 2021-11-26 NOTE — Telephone Encounter (Signed)
I called the patient and spoke with him about his Xanax prescription, Patient stated that he never got the Xanax and I informed him that he needed to call and speak with the pharmacy and see if they missed the prescription or not and to call and let me know, I informed him that the provider sent in the 6 tablets and he got those for the weekend but another script could not be written until after 12/13/2021

## 2021-11-27 ENCOUNTER — Telehealth: Payer: Self-pay

## 2021-11-27 NOTE — Telephone Encounter (Signed)
Called patient about appt on 12/01/21. No answer, Left message to call our office so we could get informaton

## 2021-11-28 ENCOUNTER — Telehealth: Payer: Self-pay

## 2021-11-28 NOTE — Telephone Encounter (Signed)
Attempted to call patient for VV 10/30. No answer. Left message to call our office.

## 2021-12-01 ENCOUNTER — Ambulatory Visit: Payer: Medicare Other | Attending: Pain Medicine | Admitting: Pain Medicine

## 2021-12-01 ENCOUNTER — Encounter: Payer: Self-pay | Admitting: Pain Medicine

## 2021-12-01 ENCOUNTER — Telehealth: Payer: Self-pay

## 2021-12-01 DIAGNOSIS — M47816 Spondylosis without myelopathy or radiculopathy, lumbar region: Secondary | ICD-10-CM

## 2021-12-01 DIAGNOSIS — M5136 Other intervertebral disc degeneration, lumbar region: Secondary | ICD-10-CM | POA: Diagnosis not present

## 2021-12-01 DIAGNOSIS — M545 Low back pain, unspecified: Secondary | ICD-10-CM | POA: Diagnosis not present

## 2021-12-01 DIAGNOSIS — E113293 Type 2 diabetes mellitus with mild nonproliferative diabetic retinopathy without macular edema, bilateral: Secondary | ICD-10-CM | POA: Diagnosis not present

## 2021-12-01 DIAGNOSIS — G8929 Other chronic pain: Secondary | ICD-10-CM | POA: Diagnosis not present

## 2021-12-01 LAB — HM DIABETES EYE EXAM

## 2021-12-01 NOTE — Progress Notes (Signed)
Patient: Alan Schmidt  Service Category: E/M  Provider: Gaspar Cola, MD  DOB: Jan 02, 1946  DOS: 12/01/2021  Location: Office  MRN: [id number]  Setting: Ambulatory outpatient  Referring Provider: [name], MD  Type: Established Patient  Specialty: Interventional Pain Management  PCP: Alan Haven, MD  Location: Remote location  Delivery: TeleHealth     Virtual Encounter - Pain Management PROVIDER NOTE: Information contained herein reflects review and annotations entered in association with encounter. Interpretation of such information and data should be left to medically-trained personnel. Information provided to patient can be located elsewhere in the medical record under "Patient Instructions". Document created using STT-dictation technology, any transcriptional errors that may result from process are unintentional.    Contact & Pharmacy Preferred: [phone] Home: [phone] (home) Mobile: There is no such number on file (mobile). E-mail: [email or web address]  [facility] [place], [address] [place] [address] Phone: [phone] Fax: [phone]  [id number] [place] [place]Phone: [phone]: [phone]  Pre-screening  Alan Schmidt "in-person" vs "virtual" encounter. He indicated preferring virtual for this encounter.   Reason COVID-19*  Social distancing based on CDC and AMA recommendations.   I contacted HRoselee Culveron 12/01/2021 via telephone.      I clearly identified myself as FGaspar Cola MD. I verified that I was speaking with the correct person using two identifiers (Name: HGARRETT Schmidt and date of birth: 1July 16, 1947.  Consent I sought verbal advanced consent from HRoselee Culverfor virtual visit interactions. I informed Alan Schmidt possible security and privacy concerns,  risks, and limitations associated with providing "not-in-person" medical evaluation and management services. I also informed Alan Schmidt the availability of "in-person" appointments. Finally, I informed him that there would be a charge for the virtual visit and that he could be  personally, fully or partially, financially responsible for it. Alan Schmidt understanding and agreed to proceed.   Historic Elements   [name] a 76y.o. year old, male patient evaluated today after our last contact on 09/29/2021. Alan Schmidt has a past medical history of Acute postoperative pain (11/24/2016), Anxiety, Chronic hip pain (Right) (12/05/2014), Chronic lumbar pain, Depression, Diabetes mellitus without complication ([place], Hyperlipidemia, Hypertension, Kidney stones, and Migraines. He also  has a past surgical history that includes Total hip arthroplasty; Tonsillectomy; and right hip surgery. Mr. TAllcockhas a current medication list which includes the following prescription(s): accu-chek guide, acetaminophen, alprazolam, aspirin ec, bupropion, carvedilol, freestyle libre 14 day reader, freestyle libre 14 day sensor, desoximetasone, empagliflozin, epinephrine, escitalopram, gabapentin, hydrochlorothiazide, insulin lispro, bd pen needle nano u/f, loratadine, lubiprostone, metformin, methocarbamol, mometasone, [START ON 12/07/2021] morphine, morphine, naloxone, olmesartan, pantoprazole, rosuvastatin, sennosides-docusate sodium, tamsulosin, tirzepatide, tresiba flextouch, morphine, and benefiber. He  reports that he has quit smoking. His smoking use included cigarettes. He has never used smokeless tobacco. He reports that he does not drink alcohol and does not use drugs. Alan Schmidt allergic to contrast media [iodinated contrast media], iodine, and shellfish allergy.   HPI  Today, he is being contacted for worsening of previously known (established) problem.  Today the patient indicated having  recurrence of his low back pain which she refers is bilateral with a significant component been in the midline and then radiating to both sides with the right side being worse than the left.  He also refers that the pain will radiate down through the buttocks area and into the top of his hamstrings, bilaterally, again with the right side being worse than the left.  He denies any of this pain reaching the knee.  He also denies any numbness or weakness.  Today I had the patient do a hyperextension and rotation maneuver which she indicated reproduces pain.  He does have a history of bilateral lumbar facet syndrome where he has had multiple radiofrequency ablations.  However, we have had to stop doing the radiofrequency ablation secondary to difficulties communicating with the patient.  He tends to the extremely talkative and quite frankly he rambles a lot.  Even during today's encounter I had to call him back to stay on topic twice since he starts talking about the pain and he will go on and on.  Today I have tried to be as gentle as possible and explaining to him that there is certain type of information that I need to get out of the encounter and that he is better served by allowing me to ask some specific questions and to keep him on topic.  Even when I explained to him the reason why we were trying to avoid the radiofrequencies, he began to talk on explain about things that were of no consequence such as an event that occurred quite some time ago where apparently he signed for a service to get medications from [place] and he was thinking that we were upset at him because he had done that, and in reality we simply explained to the patient that our policy is not to send prescriptions for controlled substances to mail pharmacies, due to their poor track record in providing patients with their medications in a timely manner.  In any case, the point is that he extended an encounter that could have lasted 10 to 15 minutes  into one that lasted 34 minutes.  In summary, he has recurrence of his lumbar facet syndrome, his last treatment of this area was on 12/14/2019, he has worn off and he needs to again have it treated.  The patient was informed that we will be scheduling him for a bilateral lumbar facet block, as soon as possible.  He understood and accepted.   Pharmacotherapy Assessment   Opioid Analgesic: Morphine ER (MS Contin) 30 mg, 1 tab p.o. twice daily (60 mg/day).  (Previously on oxycodone IR 10 mg every 6 hours (40 mg/day)) MME/day: 60 mg/day.   Monitoring: [place] PMP: PDMP reviewed during this encounter.       Pharmacotherapy: No side-effects or adverse reactions reported. Compliance: No problems identified. Effectiveness: Clinically acceptable. Plan: Refer to "POC". UDS:  Summary  Date Value Ref Range Status  09/29/2021 Note  Final    Comment:    ==================================================================== [employer] Select 13 (MW) ==================================================================== Test                             Result       Flag       Units  Drug Present and Declared for Prescription Verification   Alprazolam                     109          EXPECTED   ng/mg creat   Alpha-hydroxyalprazolam        314          EXPECTED   ng/mg creat  Source of alprazolam is a scheduled prescription medication. Alpha-    hydroxyalprazolam is an expected metabolite of alprazolam.    Morphine                       >9709        EXPECTED   ng/mg creat   Normorphine                    290          EXPECTED   ng/mg creat    Potential sources of large amounts of morphine in the absence of    codeine include administration of morphine or use of heroin.     Normorphine is an expected metabolite of morphine.    Hydromorphone                  236          EXPECTED   ng/mg creat    Hydromorphone may be present as a metabolite of morphine;    concentrations of hydromorphone rarely exceed 5%  of the morphine    concentration when this is the source of hydromorphone.  ==================================================================== Test                      Result    Flag   Units      Ref Range   Creatinine              103              mg/dL      >=20 ==================================================================== Declared Medications:  The flagging and interpretation on this report are based on the  following declared medications.  Unexpected results may arise from  inaccuracies in the declared medications.   **Note: The testing scope of this panel includes these medications:   Alprazolam (Xanax)  Morphine (MS Contin)   **Note: The testing scope of this panel does not include the  following reported medications:   Acetaminophen (Tylenol)  Aspirin  Bupropion (Wellbutrin)  Carvedilol (Coreg)  Desoximetasone (Topicort)  Empagliflozin (Jardiance)  Epinephrine (EpiPen)  Escitalopram (Lexapro)  Gabapentin (Neurontin)  Hydrochlorothiazide (Hydrodiuril)  Insulin Tyler Aas)  Loratadine (Claritin)  Lubiprostone (Amitiza)  Metformin (Glucophage)  Methocarbamol (Robaxin)  Mometasone (Nasonex)  Naloxone (Narcan)  Olmesartan (Benicar)  Pantoprazole (Protonix)  Rosuvastatin (Crestor)  Sennosides (Senokot)  Supplement (Fiber)  Tamsulosin (Flomax)  Tirzepatide (Mounjaro) ==================================================================== For clinical consultation, please call [phone]. ====================================================================    No results found for: "CBDTHCR", "D8THCCBX", "D9THCCBX"   Laboratory Chemistry Profile   Renal Lab Results  Component Value Date   BUN 12 04/25/2021   CREATININE 1.02 01/00/7121   BCR NOT APPLICABLE 97/58/8325   GFR 76.84 09/30/2020   GFRAA >60 03/26/2018   GFRNONAA >60 03/26/2018    Hepatic Lab Results  Component Value Date   AST 18 06/17/2021   ALT 26 06/17/2021   ALBUMIN 3.9  06/17/2021   ALKPHOS 71 06/17/2021    Electrolytes Lab Results  Component Value Date   NA 140 04/25/2021   K 4.0 04/25/2021   CL 101 04/25/2021   CALCIUM 9.7 04/25/2021   MG 1.8 03/06/2015    Bone No results found for: "VD25OH", "VD125OH2TOT", "QD8264BR8", "XE9407WK0", "25OHVITD1", "25OHVITD2", "88PJSRPR9", "TESTOFREE", "TESTOSTERONE"  Inflammation (CRP: Acute Phase) (ESR: Chronic Phase) Lab Results  Component Value Date   CRP 0.5 03/06/2015   ESRSEDRATE 45 (H) 12/14/2016  Note: Above Lab results reviewed.  Imaging  CT RENAL STONE STUDY CLINICAL DATA:  Left lower quadrant abdominal pain. History of nephrolithiasis.  EXAM: CT ABDOMEN AND PELVIS WITHOUT CONTRAST  TECHNIQUE: Multidetector CT imaging of the abdomen and pelvis was performed following the standard protocol without IV contrast.  COMPARISON:  10/25/2016 CT abdomen/pelvis.  FINDINGS: Lower chest: No significant pulmonary nodules or acute consolidative airspace disease. Coronary atherosclerosis.  Hepatobiliary: Normal liver size. Scattered punctate granulomatous calcifications in the liver are unchanged. No liver masses. Normal gallbladder with no radiopaque cholelithiasis. No biliary ductal dilatation.  Pancreas: Normal, with no mass or duct dilation.  Spleen: Normal size spleen. Granulomatous splenic calcifications. No splenic masses.  Adrenals/Urinary Tract: Normal adrenals. No renal stones. No hydronephrosis. Simple bilateral renal cysts, largest 3.6 cm in the upper left kidney and 2.9 cm in the posterior interpolar right kidney. Normal caliber ureters. No ureteral stones, noting poor visualization of the pelvic ureters due to streak artifact from right hip hardware. Bladder obscured by streak artifact. A 3 mm calcification projecting over the posterior bladder just to the right of midline (series 2/image 84), not previously visualized on 10/25/2016 CT, cannot exclude a layering bladder  stone.  Stomach/Bowel: Normal non-distended stomach. Normal caliber small bowel with no small bowel wall thickening. Normal appendix. Minimal scattered left colonic diverticulosis with no large bowel wall thickening or significant pericolonic fat stranding.  Vascular/Lymphatic: Atherosclerotic nonaneurysmal abdominal aorta. No pathologically enlarged lymph nodes in the abdomen or pelvis.  Reproductive: Mildly enlarged prostate.  Other: No pneumoperitoneum, ascites or focal fluid collection.  Musculoskeletal: No aggressive appearing focal osseous lesions. Right total hip arthroplasty. Marked lumbar spondylosis.  IMPRESSION: 1. Limited pelvic visualization due to streak artifact from right hip hardware. A 3 mm calcification projecting over the posterior bladder just to the right of midline, not previously visualized on 10/25/2016 CT, cannot exclude a layering bladder stone. No hydronephrosis. No renal collecting system or ureteral stones. 2. Minimal left colonic diverticulosis. 3. Mildly enlarged prostate. 4. Coronary atherosclerosis. 5. Aortic Atherosclerosis (ICD10-I70.0).  Electronically Signed   By: Ilona Sorrel M.D.   On: 05/30/2020 16:17  Assessment  The primary encounter diagnosis was Chronic low back pain (1ry area of Pain) (Bilateral) w/o sciatica. Diagnoses of DDD (degenerative disc disease), lumbar, Lumbar facet syndrome (Bilateral) (R>L), and Lumbar facet joint osteoarthritis (Bilateral) were also pertinent to this visit.  Plan of Care  Problem-specific:  No problem-specific Assessment & Plan notes found for this encounter.  Mr. VADHIR MCNAY has a current medication list which includes the following long-term medication(s): bupropion, carvedilol, escitalopram, gabapentin, hydrochlorothiazide, insulin lispro, loratadine, lubiprostone, metformin, mometasone, [START ON 12/07/2021] morphine, morphine, olmesartan, pantoprazole, rosuvastatin, morphine, and  benefiber.  Pharmacotherapy (Medications Ordered): No orders of the defined types were placed in this encounter.  Orders:  Orders Placed This Encounter  Procedures   LUMBAR FACET(MEDIAL BRANCH NERVE BLOCK) MBNB    Standing Status:   Future    Standing Expiration Date:   03/03/2022    Scheduling Instructions:     Procedure: Lumbar facet block (AKA.: Lumbosacral medial branch nerve block)     Side: Bilateral     Level: L3-4 & L5-S1 Facets (L2, L3, L4, L5, & S1 Medial Branch Nerves)     Sedation: Patient's choice.     Timeframe: ASAA    Order Specific Question:   Where will this procedure be performed?    Answer:   [facility]   Follow-up plan:   Return for  procedure (ECT): (B) L-FCT Blk.     Interventional Therapies  Risk  Complexity Considerations:   Estimated body mass index is 32.98 kg/m as calculated from the following:   Height as of 09/29/21: _0  (1.854 m).   Weight as of 09/29/21: 250 lb (113.4 kg).     NO MORE RFA - intraoperative noncompliance, unnecessarily increasing radiation exposure NOTE: NO MORE RFA procedures. Difficulty following intra-procedural instructions and commands, resulting in unnecessarily long exposure of staff to radiation.   Planned  Pending:   Therapeutic bilateral lumbar facet MBB    Under consideration:   Diagnostic Left GONB   Completed:   Therapeutic left lumbar facet RFA x2 (05/09/2019) (90/90/77/>75)  Therapeutic right lumbar facet RFA x6 (12/14/2019) (100/100/70/70)  Diagnostic/therapeutic right small joint inj. (thumb) x1 (03/15/2018) (N/A)  Diagnostic/therapeutic left cervical ESI x1 (12/18/2015) (90/70/50/50)    Completed by other providers:   None at this time   Therapeutic  Palliative (PRN) options:   Palliative lumbar facet MBB  Therapeutic cervical ESI     Recent Visits Date Type Provider Dept  09/29/21 Office Visit Milinda Pointer, MD [facility]  Showing recent visits within past 90 days and  meeting all other requirements Today's Visits Date Type Provider Dept  12/01/21 Office Visit Milinda Pointer, MD [facility]  Showing today's visits and meeting all other requirements Future Appointments Date Type Provider Dept  12/31/21 Appointment Milinda Pointer, MD [facility]  Showing future appointments within next 90 days and meeting all other requirements  I discussed the assessment and treatment plan with the patient. The patient was provided an opportunity to ask questions and all were answered. The patient agreed with the plan and demonstrated an understanding of the instructions.  Patient advised to call back or seek an in-person evaluation if the symptoms or condition worsens.  Duration of encounter: 34 minutes.  Note by: Alan Cola, MD Date: 12/01/2021; Time: 3:42 PM

## 2021-12-01 NOTE — Patient Instructions (Signed)
______________________________________________________________________  Preparing for your procedure  During your procedure appointment there will be: No Prescription Refills. No disability issues to discussed. No medication changes or discussions.  Instructions: Food intake: Avoid eating anything solid for at least 8 hours prior to your procedure. Clear liquid intake: You may take clear liquids such as water up to 2 hours prior to your procedure. (No carbonated drinks. No soda.) Transportation: Unless otherwise stated by your physician, bring a driver. Morning Medicines: Except for blood thinners, take all of your other morning medications with a sip of water. Make sure to take your heart and blood pressure medicines. If your blood pressure's lower number is above 100, the case will be rescheduled. Blood thinners: If you take a blood thinner, but were not instructed to stop it, call our office [phone] and ask to talk to a nurse. Not stopping a blood thinner prior to certain procedures could lead to serious complications. Diabetics on insulin: Notify the staff so that you can be scheduled 1st case in the morning. If your diabetes requires high dose insulin, take only  of your normal insulin dose the morning of the procedure and notify the staff that you have done so. Preventing infections: Shower with an antibacterial soap the morning of your procedure.  Build-up your immune system: Take 1000 mg of Vitamin C with every meal (3 times a day) the day prior to your procedure. Antibiotics: Inform the nursing staff if you are taking any antibiotics or if you have any conditions that may require antibiotics prior to procedures. (Example: recent joint implants)   Pregnancy: If you are pregnant make sure to notify the nursing staff. Not doing so may result in injury to the fetus, including death.  Sickness: If you have a cold, fever, or any active infections, call and cancel or reschedule your  procedure. Receiving steroids while having an infection may result in complications. Arrival: You must be in the facility at least 30 minutes prior to your scheduled procedure. Tardiness: Your scheduled time is also the cutoff time. If you do not arrive at least 15 minutes prior to your procedure, you will be rescheduled.  Children: Do not bring any children with you. Make arrangements to keep them home. Dress appropriately: There is always a possibility that your clothing may get soiled. Avoid long dresses. Valuables: Do not bring any jewelry or valuables.  Reasons to call and reschedule or cancel your procedure: (Following these recommendations will minimize the risk of a serious complication.) Surgeries: Avoid having procedures within 2 weeks of any surgery. (Avoid for 2 weeks before or after any surgery). Flu Shots: Avoid having procedures within 2 weeks of a flu shots or . (Avoid for 2 weeks before or after immunizations). Barium: Avoid having a procedure within 7-10 days after having had a radiological study involving the use of radiological contrast. (Myelograms, Barium swallow or enema study). Heart attacks: Avoid any elective procedures or surgeries for the initial 6 months after a "Myocardial Infarction" (Heart Attack). Blood thinners: It is imperative that you stop these medications before procedures. Let us know if you if you take any blood thinner.  Infection: Avoid procedures during or within two weeks of an infection (including chest colds or gastrointestinal problems). Symptoms associated with infections include: Localized redness, fever, chills, night sweats or profuse sweating, burning sensation when voiding, cough, congestion, stuffiness, runny nose, sore throat, diarrhea, nausea, vomiting, cold or Flu symptoms, recent or current infections. It is specially important if the infection is   over the area that we intend to treat. Heart and lung problems: Symptoms that may suggest an  active cardiopulmonary problem include: cough, chest pain, breathing difficulties or shortness of breath, dizziness, ankle swelling, uncontrolled high or unusually low blood pressure, and/or palpitations. If you are experiencing any of these symptoms, cancel your procedure and contact your primary care physician for an evaluation.  Remember:  Regular Business hours are:  Monday to Thursday 8:00 AM to 4:00 PM  Provider's Schedule: Earlisha Sharples, MD:  Procedure days: Tuesday and Thursday 7:30 AM to 4:00 PM  Bilal Lateef, MD:  Procedure days: Monday and Wednesday 7:30 AM to 4:00 PM  ______________________________________________________________________    ____________________________________________________________________________________________  General Risks and Possible Complications  Patient Responsibilities: It is important that you read this as it is part of your informed consent. It is our duty to inform you of the risks and possible complications associated with treatments offered to you. It is your responsibility as a patient to read this and to ask questions about anything that is not clear or that you believe was not covered in this document.  Patient's Rights: You have the right to refuse treatment. You also have the right to change your mind, even after initially having agreed to have the treatment done. However, under this last option, if you wait until the last second to change your mind, you may be charged for the materials used up to that point.  Introduction: Medicine is not an exact science. Everything in Medicine, including the lack of treatment(s), carries the potential for danger, harm, or loss (which is by definition: Risk). In Medicine, a complication is a secondary problem, condition, or disease that can aggravate an already existing one. All treatments carry the risk of possible complications. The fact that a side effects or complications occurs, does not imply  that the treatment was conducted incorrectly. It must be clearly understood that these can happen even when everything is done following the highest safety standards.  No treatment: You can choose not to proceed with the proposed treatment alternative. The "PRO(s)" would include: avoiding the risk of complications associated with the therapy. The "CON(s)" would include: not getting any of the treatment benefits. These benefits fall under one of three categories: diagnostic; therapeutic; and/or palliative. Diagnostic benefits include: getting information which can ultimately lead to improvement of the disease or symptom(s). Therapeutic benefits are those associated with the successful treatment of the disease. Finally, palliative benefits are those related to the decrease of the primary symptoms, without necessarily curing the condition (example: decreasing the pain from a flare-up of a chronic condition, such as incurable terminal cancer).  General Risks and Complications: These are associated to most interventional treatments. They can occur alone, or in combination. They fall under one of the following six (6) categories: no benefit or worsening of symptoms; bleeding; infection; nerve damage; allergic reactions; and/or death. No benefits or worsening of symptoms: In Medicine there are no guarantees, only probabilities. No healthcare provider can ever guarantee that a medical treatment will work, they can only state the probability that it may. Furthermore, there is always the possibility that the condition may worsen, either directly, or indirectly, as a consequence of the treatment. Bleeding: This is more common if the patient is taking a blood thinner, either prescription or over the counter (example: Goody Powders, Fish oil, Aspirin, Garlic, etc.), or if suffering a condition associated with impaired coagulation (example: Hemophilia, cirrhosis of the liver, low platelet counts, etc.). However, even if   you  do not have one on these, it can still happen. If you have any of these conditions, or take one of these drugs, make sure to notify your treating physician. Infection: This is more common in patients with a compromised immune system, either due to disease (example: diabetes, cancer, human immunodeficiency virus [HIV], etc.), or due to medications or treatments (example: therapies used to treat cancer and rheumatological diseases). However, even if you do not have one on these, it can still happen. If you have any of these conditions, or take one of these drugs, make sure to notify your treating physician. Nerve Damage: This is more common when the treatment is an invasive one, but it can also happen with the use of medications, such as those used in the treatment of cancer. The damage can occur to small secondary nerves, or to large primary ones, such as those in the spinal cord and brain. This damage may be temporary or permanent and it may lead to impairments that can range from temporary numbness to permanent paralysis and/or brain death. Allergic Reactions: Any time a substance or material comes in contact with our body, there is the possibility of an allergic reaction. These can range from a mild skin rash (contact dermatitis) to a severe systemic reaction (anaphylactic reaction), which can result in death. Death: In general, any medical intervention can result in death, most of the time due to an unforeseen complication. ____________________________________________________________________________________________    

## 2021-12-01 NOTE — Telephone Encounter (Signed)
LM for patient to call office for pre virtual appointment questions.  

## 2021-12-03 ENCOUNTER — Ambulatory Visit: Payer: Medicare Other | Admitting: Family Medicine

## 2021-12-08 ENCOUNTER — Other Ambulatory Visit: Payer: Self-pay | Admitting: Family Medicine

## 2021-12-08 DIAGNOSIS — E1142 Type 2 diabetes mellitus with diabetic polyneuropathy: Secondary | ICD-10-CM

## 2021-12-16 ENCOUNTER — Encounter: Payer: Self-pay | Admitting: Pain Medicine

## 2021-12-16 ENCOUNTER — Ambulatory Visit
Admission: RE | Admit: 2021-12-16 | Discharge: 2021-12-16 | Disposition: A | Discharge status: Home / Self Care | Payer: Medicare Other | Source: Ambulatory Visit | Attending: Pain Medicine | Admitting: Pain Medicine

## 2021-12-16 ENCOUNTER — Ambulatory Visit: Payer: Medicare Other | Attending: Pain Medicine | Admitting: Pain Medicine

## 2021-12-16 VITALS — BP 140/80 | HR 92 | Temp 97.3°F | Resp 20 | Ht 73.0 in | Wt 250.0 lb

## 2021-12-16 DIAGNOSIS — M47817 Spondylosis without myelopathy or radiculopathy, lumbosacral region: Secondary | ICD-10-CM | POA: Diagnosis not present

## 2021-12-16 DIAGNOSIS — G8929 Other chronic pain: Secondary | ICD-10-CM | POA: Diagnosis not present

## 2021-12-16 DIAGNOSIS — M545 Low back pain, unspecified: Secondary | ICD-10-CM | POA: Insufficient documentation

## 2021-12-16 DIAGNOSIS — Z883 Allergy status to other anti-infective agents status: Secondary | ICD-10-CM | POA: Insufficient documentation

## 2021-12-16 DIAGNOSIS — Z91041 Radiographic dye allergy status: Secondary | ICD-10-CM | POA: Insufficient documentation

## 2021-12-16 DIAGNOSIS — M47816 Spondylosis without myelopathy or radiculopathy, lumbar region: Secondary | ICD-10-CM | POA: Diagnosis not present

## 2021-12-16 DIAGNOSIS — R55 Syncope and collapse: Secondary | ICD-10-CM | POA: Insufficient documentation

## 2021-12-16 DIAGNOSIS — M5136 Other intervertebral disc degeneration, lumbar region: Secondary | ICD-10-CM | POA: Insufficient documentation

## 2021-12-16 MED ORDER — TRIAMCINOLONE ACETONIDE 40 MG/ML IJ SUSP
80.0000 mg | Freq: Once | INTRAMUSCULAR | Status: AC
  Administered 2021-12-16: 80 mg

## 2021-12-16 MED ORDER — FENTANYL CITRATE (PF) 100 MCG/2ML IJ SOLN
INTRAMUSCULAR | Status: AC
  Filled 2021-12-16: qty 2

## 2021-12-16 MED ORDER — ROPIVACAINE HCL 2 MG/ML IJ SOLN
18.0000 mL | Freq: Once | INTRAMUSCULAR | Status: AC
  Administered 2021-12-16: 18 mL via PERINEURAL

## 2021-12-16 MED ORDER — TRIAMCINOLONE ACETONIDE 40 MG/ML IJ SUSP
INTRAMUSCULAR | Status: AC
  Filled 2021-12-16: qty 2

## 2021-12-16 MED ORDER — ROPIVACAINE HCL 2 MG/ML IJ SOLN
INTRAMUSCULAR | Status: AC
  Filled 2021-12-16: qty 20

## 2021-12-16 MED ORDER — GLYCOPYRROLATE 0.2 MG/ML IJ SOLN: 0.2000 mg | Freq: Once | INTRAMUSCULAR | Status: DC

## 2021-12-16 MED ORDER — MIDAZOLAM HCL 2 MG/2ML IJ SOLN
0.5000 mg | Freq: Once | INTRAMUSCULAR | Status: AC
  Administered 2021-12-16: 2 mg via INTRAVENOUS

## 2021-12-16 MED ORDER — PENTAFLUOROPROP-TETRAFLUOROETH EX AERO
INHALATION_SPRAY | Freq: Once | CUTANEOUS | Status: AC
  Administered 2021-12-16: 30 via TOPICAL
  Filled 2021-12-16: qty 116

## 2021-12-16 MED ORDER — GLYCOPYRROLATE 0.2 MG/ML IJ SOLN
INTRAMUSCULAR | Status: AC
  Filled 2021-12-16: qty 1

## 2021-12-16 MED ORDER — FENTANYL CITRATE (PF) 100 MCG/2ML IJ SOLN
25.0000 ug | INTRAMUSCULAR | Status: DC | PRN
  Administered 2021-12-16: 50 ug via INTRAVENOUS

## 2021-12-16 MED ORDER — LIDOCAINE HCL 2 % IJ SOLN
INTRAMUSCULAR | Status: AC
  Filled 2021-12-16: qty 20

## 2021-12-16 MED ORDER — LIDOCAINE HCL 2 % IJ SOLN
20.0000 mL | Freq: Once | INTRAMUSCULAR | Status: AC
  Administered 2021-12-16: 400 mg

## 2021-12-16 MED ORDER — MIDAZOLAM HCL 5 MG/5ML IJ SOLN
INTRAMUSCULAR | Status: AC
  Filled 2021-12-16: qty 5

## 2021-12-16 MED ORDER — LACTATED RINGERS IV SOLN: Freq: Once | INTRAVENOUS | Status: AC

## 2021-12-16 NOTE — Progress Notes (Signed)
Safety precautions to be maintained throughout the outpatient stay will include: orient to surroundings, keep bed in low position, maintain call bell within reach at all times, provide assistance with transfer out of bed and ambulation.  

## 2021-12-16 NOTE — Progress Notes (Signed)
PROVIDER NOTE: Interpretation of information contained herein should be left to medically-trained personnel. Specific patient instructions are provided elsewhere under "Patient Instructions" section of medical record. This document was created in part using STT-dictation technology, any transcriptional errors that may result from this process are unintentional.  Patient: Alan Schmidt Type: Established DOB: Jan 05, 1946 MRN: [id number] PCP: Alan Haven, MD  Service: Procedure DOS: 12/16/2021 Setting: Ambulatory Location: Ambulatory outpatient facility Delivery: Face-to-face Provider: Gaspar Cola, MD Specialty: Interventional Pain Management Specialty designation: 09 Location: Outpatient facility Ref. Prov.: Milinda Pointer, MD    Procedure:           Type: Lumbar Facet, Medial Branch Block(s) #3  Laterality: Bilateral  Level: L3, L4, L5, & S1 Medial Branch Level(s). Injecting these levels blocks the L4-5 and L5-S1 lumbar facet joints. Imaging: Fluoroscopic guidance         Anesthesia: Local anesthesia (1-2% Lidocaine) Anxiolysis: IV Versed         Sedation: Moderate Sedation                       DOS: 12/16/2021 Performed by: Gaspar Cola, MD  Primary Purpose: Diagnostic/Therapeutic Indications: Low back pain severe enough to impact quality of life or function. 1. Lumbar facet syndrome (Bilateral) (R>L)   2. Spondylosis without myelopathy or radiculopathy, lumbosacral region   3. DDD (degenerative disc disease), lumbar   4. Lumbar facet joint osteoarthritis (Bilateral)   5. Chronic low back pain (1ry area of Pain) (Bilateral) w/o sciatica   6. History of allergy to radiographic contrast media   7. History of allergy to povidone-iodine topical antiseptic   8. History of Vasovagal response to spinal injections    NAS-11 Pain score:   Pre-procedure: 3 /10   Post-procedure: 0-No pain/10     Position / Prep / Materials:  Position: Prone  Prep solution:  DuraPrep (Iodine Povacrylex [0.7% available iodine] and Isopropyl Alcohol, 74% w/w) Area Prepped: Posterolateral Lumbosacral Spine (Wide prep: From the lower border of the scapula down to the end of the tailbone and from flank to flank.)  Materials:  Tray: Block Needle(s):  Type: Spinal  Gauge (G): 22  Length: 5-in Qty: 4     Pre-op H&P Assessment:  [name] is a 76 y.o. (year old), male patient, seen today for interventional treatment. He  has a past surgical history that includes Total hip arthroplasty; Tonsillectomy; and right hip surgery. [name] has a current medication list which includes the following prescription(s): accu-chek guide, acetaminophen, alprazolam, aspirin ec, bupropion, carvedilol, freestyle libre 14 day reader, freestyle libre 14 day sensor, desoximetasone, empagliflozin, epinephrine, escitalopram, gabapentin, hydrochlorothiazide, insulin lispro, bd pen needle nano u/f, loratadine, lubiprostone, metformin, methocarbamol, mometasone, morphine, naloxone, olmesartan, pantoprazole, rosuvastatin, sennosides-docusate sodium, tamsulosin, tirzepatide, tresiba flextouch, morphine, morphine, and benefiber, and the following Facility-Administered Medications: fentanyl and glycopyrrolate. His primarily concern today is the Back Pain  Initial Vital Signs:  Pulse/HCG Rate: 92ECG Heart Rate: 90 (NSR) Temp: (!) 97.3 F (36.3 C) Resp: 16 BP: (!) 145/89 SpO2: 99 %  BMI: Estimated body mass index is 32.98 kg/m as calculated from the following:   Height as of this encounter: '6\' 1"'$  (1.854 m).   Weight as of this encounter: 250 lb (113.4 kg).  Risk Assessment: Allergies: Reviewed. He is allergic to contrast media [iodinated contrast media], iodine, and shellfish allergy.  Allergy Precautions: None required Coagulopathies: Reviewed. None identified.  Blood-thinner therapy: None at this time Active Infection(s): Reviewed. None identified.  [name] is afebrile  Site  Confirmation: [name] was asked to confirm the procedure and laterality before marking the site Procedure checklist: Completed Consent: Before the procedure and under the influence of no sedative(s), amnesic(s), or anxiolytics, the patient was informed of the treatment options, risks and possible complications. To fulfill our ethical and legal obligations, as recommended by the American Medical Association's Code of Ethics, I have informed the patient of my clinical impression; the nature and purpose of the treatment or procedure; the risks, benefits, and possible complications of the intervention; the alternatives, including doing nothing; the risk(s) and benefit(s) of the alternative treatment(s) or procedure(s); and the risk(s) and benefit(s) of doing nothing. The patient was provided information about the general risks and possible complications associated with the procedure. These may include, but are not limited to: failure to achieve desired goals, infection, bleeding, organ or nerve damage, allergic reactions, paralysis, and death. In addition, the patient was informed of those risks and complications associated to Spine-related procedures, such as failure to decrease pain; infection (i.e.: Meningitis, epidural or intraspinal abscess); bleeding (i.e.: epidural hematoma, subarachnoid hemorrhage, or any other type of intraspinal or peri-dural bleeding); organ or nerve damage (i.e.: Any type of peripheral nerve, nerve root, or spinal cord injury) with subsequent damage to sensory, motor, and/or autonomic systems, resulting in permanent pain, numbness, and/or weakness of one or several areas of the body; allergic reactions; (i.e.: anaphylactic reaction); and/or death. Furthermore, the patient was informed of those risks and complications associated with the medications. These include, but are not limited to: allergic reactions (i.e.: anaphylactic or anaphylactoid reaction(s)); adrenal axis suppression;  blood sugar elevation that in diabetics may result in ketoacidosis or comma; water retention that in patients with history of congestive heart failure may result in shortness of breath, pulmonary edema, and decompensation with resultant heart failure; weight gain; swelling or edema; medication-induced neural toxicity; particulate matter embolism and blood vessel occlusion with resultant organ, and/or nervous system infarction; and/or aseptic necrosis of one or more joints. Finally, the patient was informed that Medicine is not an exact science; therefore, there is also the possibility of unforeseen or unpredictable risks and/or possible complications that may result in a catastrophic outcome. The patient indicated having understood very clearly. We have given the patient no guarantees and we have made no promises. Enough time was given to the patient to ask questions, all of which were answered to the patient's satisfaction. Mr. Deren has indicated that he wanted to continue with the procedure. Attestation: I, the ordering provider, attest that I have discussed with the patient the benefits, risks, side-effects, alternatives, likelihood of achieving goals, and potential problems during recovery for the procedure that I have provided informed consent. Date  Time: 12/16/2021  8:01 AM  Pre-Procedure Preparation:  Monitoring: As per clinic protocol. Respiration, ETCO2, SpO2, BP, heart rate and rhythm monitor placed and checked for adequate function Safety Precautions: Patient was assessed for positional comfort and pressure points before starting the procedure. Time-out: I initiated and conducted the "Time-out" before starting the procedure, as per protocol. The patient was asked to participate by confirming the accuracy of the "Time Out" information. Verification of the correct person, site, and procedure were performed and confirmed by me, the nursing staff, and the patient. "Time-out" conducted as per  Joint Commission's Universal Protocol (UP.01.01.01). Time: 9562  Description of Procedure:          Laterality: Bilateral. The procedure was performed in identical fashion on both sides. Targeted  Levels:  L3, L4, L5, & S1 Medial Branch Level(s)  Safety Precautions: Aspiration looking for blood return was conducted prior to all injections. At no point did we inject any substances, as a needle was being advanced. Before injecting, the patient was told to immediately notify me if he was experiencing any new onset of "ringing in the ears, or metallic taste in the mouth". No attempts were made at seeking any paresthesias. Safe injection practices and needle disposal techniques used. Medications properly checked for expiration dates. SDV (single dose vial) medications used. After the completion of the procedure, all disposable equipment used was discarded in the proper designated medical waste containers. Local Anesthesia: Protocol guidelines were followed. The patient was positioned over the fluoroscopy table. The area was prepped in the usual manner. The time-out was completed. The target area was identified using fluoroscopy. A 12-in long, straight, sterile hemostat was used with fluoroscopic guidance to locate the targets for each level blocked. Once located, the skin was marked with an approved surgical skin marker. Once all sites were marked, the skin (epidermis, dermis, and hypodermis), as well as deeper tissues (fat, connective tissue and muscle) were infiltrated with a small amount of a short-acting local anesthetic, loaded on a 10cc syringe with a 25G, 1.5-in  Needle. An appropriate amount of time was allowed for local anesthetics to take effect before proceeding to the next step. Local Anesthetic: Lidocaine 2.0% The unused portion of the local anesthetic was discarded in the proper designated containers. Technical description of process:   L3 Medial Branch Nerve Block (MBB): The target area for the  L3 medial branch is at the junction of the postero-lateral aspect of the superior articular process and the superior, posterior, and medial edge of the transverse process of L4. Under fluoroscopic guidance, a Quincke needle was inserted until contact was made with os over the superior postero-lateral aspect of the pedicular shadow (target area). After negative aspiration for blood, 0.5 mL of the nerve block solution was injected without difficulty or complication. The needle was removed intact. L4 Medial Branch Nerve Block (MBB): The target area for the L4 medial branch is at the junction of the postero-lateral aspect of the superior articular process and the superior, posterior, and medial edge of the transverse process of L5. Under fluoroscopic guidance, a Quincke needle was inserted until contact was made with os over the superior postero-lateral aspect of the pedicular shadow (target area). After negative aspiration for blood, 0.5 mL of the nerve block solution was injected without difficulty or complication. The needle was removed intact. L5 Medial Branch Nerve Block (MBB): The target area for the L5 medial branch is at the junction of the postero-lateral aspect of the superior articular process and the superior, posterior, and medial edge of the sacral ala. Under fluoroscopic guidance, a Quincke needle was inserted until contact was made with os over the superior postero-lateral aspect of the pedicular shadow (target area). After negative aspiration for blood, 0.5 mL of the nerve block solution was injected without difficulty or complication. The needle was removed intact. S1 Medial Branch Nerve Block (MBB): The target area for the S1 medial branch is at the posterior and inferior 6 o'clock position of the L5-S1 facet joint. Under fluoroscopic guidance, the Quincke needle inserted for the L5 MBB was redirected until contact was made with os over the inferior and postero aspect of the sacrum, at the 6 o'  clock position under the L5-S1 facet joint (Target area). After negative aspiration for  blood, 0.5 mL of the nerve block solution was injected without difficulty or complication. The needle was removed intact.  Once the entire procedure was completed, the treated area was cleaned, making sure to leave some of the prepping solution back to take advantage of its long term bactericidal properties.         Illustration of the posterior view of the lumbar spine and the posterior neural structures. Laminae of L2 through S1 are labeled. DPRL5, dorsal primary ramus of L5; DPRS1, dorsal primary ramus of S1; DPR3, dorsal primary ramus of L3; FJ, facet (zygapophyseal) joint L3-L4; I, inferior articular process of L4; LB1, lateral branch of dorsal primary ramus of L1; IAB, inferior articular branches from L3 medial branch (supplies L4-L5 facet joint); IBP, intermediate branch plexus; MB3, medial branch of dorsal primary ramus of L3; NR3, third lumbar nerve root; S, superior articular process of L5; SAB, superior articular branches from L4 (supplies L4-5 facet joint also); TP3, transverse process of L3.  Vitals:   12/16/21 0855 12/16/21 0905 12/16/21 0910 12/16/21 0915  BP: 125/70 (!) 143/97  (!) 140/80  Pulse:      Resp: '18 16  20  '$ Temp:      SpO2: 95% 98% 95% 96%  Weight:      Height:         Start Time: 0833 hrs. End Time: 0846 hrs.  Imaging Guidance (Spinal):          Type of Imaging Technique: Fluoroscopy Guidance (Spinal) Indication(s): Assistance in needle guidance and placement for procedures requiring needle placement in or near specific anatomical locations not easily accessible without such assistance. Exposure Time: Please see nurses notes. Contrast: None used. Fluoroscopic Guidance: I was personally present during the use of fluoroscopy. "Tunnel Vision Technique" used to obtain the best possible view of the target area. Parallax error corrected before commencing the procedure.  "Direction-depth-direction" technique used to introduce the needle under continuous pulsed fluoroscopy. Once target was reached, antero-posterior, oblique, and lateral fluoroscopic projection used confirm needle placement in all planes. Images permanently stored in EMR. Interpretation: No contrast injected. I personally interpreted the imaging intraoperatively. Adequate needle placement confirmed in multiple planes. Permanent images saved into the patient's record.  Antibiotic Prophylaxis:   Anti-infectives (From admission, onward)    None      Indication(s): None identified  Post-operative Assessment:  Post-procedure Vital Signs:  Pulse/HCG Rate: 9278 (nsr) Temp: (!) 97.3 F (36.3 C) Resp: 20 BP: (!) 140/80 SpO2: 96 %  EBL: None  Complications: No immediate post-treatment complications observed by team, or reported by patient.  Note: The patient tolerated the entire procedure well. A repeat set of vitals were taken after the procedure and the patient was kept under observation following institutional policy, for this type of procedure. Post-procedural neurological assessment was performed, showing return to baseline, prior to discharge. The patient was provided with post-procedure discharge instructions, including a section on how to identify potential problems. Should any problems arise concerning this procedure, the patient was given instructions to immediately contact us, at any time, without hesitation. In any case, we plan to contact the patient by telephone for a follow-up status report regarding this interventional procedure.  Comments:  No additional relevant information.  Plan of Care  Orders:  Orders Placed This Encounter  Procedures   LUMBAR FACET(MEDIAL BRANCH NERVE BLOCK) MBNB    Scheduling Instructions:     Procedure: Lumbar facet block (AKA.: Lumbosacral medial branch nerve block)     Side: Bilateral  Level: L3-4 & L5-S1 Facets (L2, L3, L4, L5, & S1 Medial  Branch Nerves)     Sedation: Patient's choice.     Timeframe: Today    Order Specific Question:   Where will this procedure be performed?    Answer:   [facility]   DG [facility] C-ARM 1-60 MIN NO REPORT    Intraoperative interpretation by procedural physician at [facility].    Standing Status:   Standing    Number of Occurrences:   1    Order Specific Question:   Reason for exam:    Answer:   Assistance in needle guidance and placement for procedures requiring needle placement in or near specific anatomical locations not easily accessible without such assistance.   Informed Consent Details: Physician/Practitioner Attestation; Transcribe to consent form and obtain patient signature    Nursing Order: Transcribe to consent form and obtain patient signature. Note: Always confirm laterality of pain with Mr. Grandville Silos, before procedure.    Order Specific Question:   Physician/Practitioner attestation of informed consent for procedure/surgical case    Answer:   I, the physician/practitioner, attest that I have discussed with the patient the benefits, risks, side effects, alternatives, likelihood of achieving goals and potential problems during recovery for the procedure that I have provided informed consent.    Order Specific Question:   Procedure    Answer:   Lumbar Facet Block  under fluoroscopic guidance    Order Specific Question:   Physician/Practitioner performing the procedure    Answer:   Yomira Flitton A. Dossie Arbour MD    Order Specific Question:   Indication/Reason    Answer:   Low Back Pain, with our without leg pain, due to Facet Joint Arthralgia (Joint Pain) Spondylosis (Arthritis of the Spine), without myelopathy or radiculopathy (Nerve Damage).   Provide equipment / supplies at bedside    "Block Tray" (Disposable  single use) Needle type: SpinalSpinal Amount/quantity: 4 Size: Medium (5-inch) Gauge: 22G    Standing Status:   Standing    Number of Occurrences:   1     Order Specific Question:   Specify    Answer:   Block Tray   Miscellanous precautions    Standing Status:   Standing    Number of Occurrences:   1   Miscellanous precautions    NOTE: Although It is true that patients can have allergies to shellfish and that shellfish contain iodine, most shellfish  allergies are due to two protein allergens present in the shellfish: tropomyosins and parvalbumin. Not all patients with shellfish allergies are allergic to iodine. However, as a precaution, avoid using iodine containing products.    Standing Status:   Standing    Number of Occurrences:   1   Skin care precautions    Patient indicates being allergic to a specific skin prepping solution. Please avoid.    Standing Status:   Standing    Number of Occurrences:   1   Chronic Opioid Analgesic:  Morphine ER (MS Contin) 30 mg, 1 tab p.o. twice daily (60 mg/day).  (Previously on oxycodone IR 10 mg every 6 hours (40 mg/day)) MME/day: 60 mg/day.   Medications ordered for procedure: Meds ordered this encounter  Medications   lidocaine (XYLOCAINE) 2 % (with pres) injection 400 mg   pentafluoroprop-tetrafluoroeth (GEBAUERS) aerosol   lactated ringers infusion   midazolam (VERSED) injection 0.5-2 mg    Make sure Flumazenil is available in the pyxis when using this medication. If oversedation occurs,  administer 0.2 mg IV over 15 sec. If after 45 sec no response, administer 0.2 mg again over 1 min; may repeat at 1 min intervals; not to exceed 4 doses (1 mg)   fentaNYL (SUBLIMAZE) injection 25-50 mcg    Make sure Narcan is available in the pyxis when using this medication. In the event of respiratory depression (RR< 8/min): Titrate NARCAN (naloxone) in increments of 0.1 to 0.2 mg IV at 2-3 minute intervals, until desired degree of reversal.   glycopyrrolate (ROBINUL) injection 0.2 mg   ropivacaine (PF) 2 mg/mL (0.2%) (NAROPIN) injection 18 mL   triamcinolone acetonide (KENALOG-40) injection 80 mg    Medications administered: We administered lidocaine, pentafluoroprop-tetrafluoroeth, lactated ringers, midazolam, fentaNYL, ropivacaine (PF) 2 mg/mL (0.2%), and triamcinolone acetonide.  See the medical record for exact dosing, route, and time of administration.  Follow-up plan:   Return in about 2 weeks (around 12/30/2021) for Proc-day (T,Th), (VV), (PPE).       Interventional Therapies  Risk  Complexity Considerations:   Estimated body mass index is 32.98 kg/m as calculated from the following:   Height as of 09/29/21: '6\' 1"'$  (1.854 m).   Weight as of 09/29/21: 250 lb (113.4 kg).     NO MORE RFA - intraoperative noncompliance, unnecessarily increasing radiation exposure NOTE: NO MORE RFA procedures. Difficulty following intra-procedural instructions and commands, resulting in unnecessarily long exposure of staff to radiation.   Planned  Pending:   Therapeutic bilateral lumbar facet MBB #3 (12/16/21)   Under consideration:   Diagnostic Left GONB   Completed:   Therapeutic left lumbar facet RFA x2 (05/09/2019) (90/90/77/>75)  Therapeutic right lumbar facet RFA x6 (12/14/2019) (100/100/70/70)  Diagnostic/therapeutic right small joint inj. (thumb) x1 (03/15/2018) (N/A)  Diagnostic/therapeutic left cervical ESI x1 (12/18/2015) (90/70/50/50)    Completed by other providers:   None at this time   Therapeutic  Palliative (PRN) options:   Palliative lumbar facet MBB  Therapeutic cervical ESI      Recent Visits Date Type Provider Dept  12/01/21 Office Visit Milinda Pointer, MD [facility]  09/29/21 Office Visit Milinda Pointer, MD [facility]  Showing recent visits within past 90 days and meeting all other requirements Today's Visits Date Type Provider Dept  12/16/21 Procedure visit Milinda Pointer, MD [facility]  Showing today's visits and meeting all other requirements Future Appointments Date Type Provider Dept  12/30/21  Appointment Milinda Pointer, MD [facility]  12/31/21 Appointment Milinda Pointer, MD [facility]  Showing future appointments within next 90 days and meeting all other requirements  Disposition: Discharge home  Discharge (Date  Time): 12/16/2021; 0920 hrs.   Primary Care Physician: Alan Haven, MD Location: [facility] Note by: Gaspar Cola, MD Date: 12/16/2021; Time: 10:48 AM  Disclaimer:  Medicine is not an Chief Strategy Officer. The only guarantee in medicine is that nothing is guaranteed. It is important to note that the decision to proceed with this intervention was based on the information collected from the patient. The Data and conclusions were drawn from the patient's questionnaire, the interview, and the physical examination. Because the information was provided in large part by the patient, it cannot be guaranteed that it has not been purposely or unconsciously manipulated. Every effort has been made to obtain as much relevant data as possible for this evaluation. It is important to note that the conclusions that lead to this procedure are derived in large part from the available data. Always take  into account that the treatment will also be dependent on availability of resources and existing treatment guidelines, considered by other Pain Management Practitioners as being common knowledge and practice, at the time of the intervention. For Medico-Legal purposes, it is also important to point out that variation in procedural techniques and pharmacological choices are the acceptable norm. The indications, contraindications, technique, and results of the above procedure should only be interpreted and judged by a Board-Certified Interventional Pain Specialist with extensive familiarity and expertise in the same exact procedure and technique.

## 2021-12-16 NOTE — Patient Instructions (Addendum)
Facet Blocks Patient Information  Description: The facets are joints in the spine between the vertebrae.  Like any joints in the body, facets can become irritated and painful.  Arthritis can also effect the facets.  By injecting steroids and local anesthetic in and around these joints, we can temporarily block the nerve supply to them.  Steroids act directly on irritated nerves and tissues to reduce selling and inflammation which often leads to decreased pain.  Facet blocks may be done anywhere along the spine from the neck to the low back depending upon the location of your pain.   After numbing the skin with local anesthetic (like Novocaine), a small needle is passed onto the facet joints under x-ray guidance.  You may experience a sensation of pressure while this is being done.  The entire block usually lasts about 15-25 minutes.   Conditions which may be treated by facet blocks:  Low back/buttock pain Neck/shoulder pain Certain types of headaches  Preparation for the injection:  Do not eat any solid food or dairy products within 8 hours of your appointment. You may drink clear liquid up to 3 hours before appointment.  Clear liquids include water, black coffee, juice or soda.  No milk or cream please. You may take your regular medication, including pain medications, with a sip of water before your appointment.  Diabetics should hold regular insulin (if taken separately) and take 1/2 normal NPH dose the morning of the procedure.  Carry some sugar containing items with you to your appointment. A driver must accompany you and be prepared to drive you home after your procedure. Bring all your current medications with you. An IV may be inserted and sedation may be given at the discretion of the physician. A blood pressure cuff, EKG and other monitors will often be applied during the procedure.  Some patients may need to have extra oxygen administered for a short period. You will be asked to  provide medical information, including your allergies and medications, prior to the procedure.  We must know immediately if you are taking blood thinners (like Coumadin/Warfarin) or if you are allergic to IV iodine contrast (dye).  We must know if you could possible be pregnant.  Possible side-effects:  Bleeding from needle site Infection (rare, may require surgery) Nerve injury (rare) Numbness & tingling (temporary) Difficulty urinating (rare, temporary) Spinal headache (a headache worse with upright posture) Light-headedness (temporary) Pain at injection site (serveral days) Decreased blood pressure (rare, temporary) Weakness in arm/leg (temporary) Pressure sensation in back/neck (temporary)   Call if you experience:  Fever/chills associated with headache or increased back/neck pain Headache worsened by an upright position New onset, weakness or numbness of an extremity below the injection site Hives or difficulty breathing (go to the emergency room) Inflammation or drainage at the injection site(s) Severe back/neck pain greater than usual New symptoms which are concerning to you  Please note:  Although the local anesthetic injected can often make your back or neck feel good for several hours after the injection, the pain will likely return. It takes 3-7 days for steroids to work.  You may not notice any pain relief for at least one week.  If effective, we will often do a series of 2-3 injections spaced 3-6 weeks apart to maximally decrease your pain.  After the initial series, you may be a candidate for a more permanent nerve block of the facets.  If you have any questions, please call #[phone]) [facility]  Pain Clinic____________________________________________________________________________________________  Post-Procedure Discharge Instructions  Instructions: Apply ice:  Purpose: This will minimize any swelling and discomfort after procedure.   When: Day of procedure, as soon as you get home. How: Fill a plastic sandwich bag with crushed ice. Cover it with a small towel and apply to injection site. How long: (15 min on, 15 min off) Apply for 15 minutes then remove x 15 minutes.  Repeat sequence on day of procedure, until you go to bed. Apply heat:  Purpose: To treat any soreness and discomfort from the procedure. When: Starting the next day after the procedure. How: Apply heat to procedure site starting the day following the procedure. How long: May continue to repeat daily, until discomfort goes away. Food intake: Start with clear liquids (like water) and advance to regular food, as tolerated.  Physical activities: Keep activities to a minimum for the first 8 hours after the procedure. After that, then as tolerated. Driving: If you have received any sedation, be responsible and do not drive. You are not allowed to drive for 24 hours after having sedation. Blood thinner: (Applies only to those taking blood thinners) You may restart your blood thinner 6 hours after your procedure. Insulin: (Applies only to Diabetic patients taking insulin) As soon as you can eat, you may resume your normal dosing schedule. Infection prevention: Keep procedure site clean and dry. Shower daily and clean area with soap and water. Post-procedure Pain Diary: Extremely important that this be done correctly and accurately. Recorded information will be used to determine the next step in treatment. For the purpose of accuracy, follow these rules: Evaluate only the area treated. Do not report or include pain from an untreated area. For the purpose of this evaluation, ignore all other areas of pain, except for the treated area. After your procedure, avoid taking a long nap and attempting to complete the pain diary after you wake up. Instead, set your alarm clock to go off every hour, on the hour, for the initial 8 hours after the procedure. Document the duration of  the numbing medicine, and the relief you are getting from it. Do not go to sleep and attempt to complete it later. It will not be accurate. If you received sedation, it is likely that you were given a medication that may cause amnesia. Because of this, completing the diary at a later time may cause the information to be inaccurate. This information is needed to plan your care. Follow-up appointment: Keep your post-procedure follow-up evaluation appointment after the procedure (usually 2 weeks for most procedures, 6 weeks for radiofrequencies). DO NOT FORGET to bring you pain diary with you.   Expect: (What should I expect to see with my procedure?) From numbing medicine (AKA: Local Anesthetics): Numbness or decrease in pain. You may also experience some weakness, which if present, could last for the duration of the local anesthetic. Onset: Full effect within 15 minutes of injected. Duration: It will depend on the type of local anesthetic used. On the average, 1 to 8 hours.  From steroids (Applies only if steroids were used): Decrease in swelling or inflammation. Once inflammation is improved, relief of the pain will follow. Onset of benefits: Depends on the amount of swelling present. The more swelling, the longer it will take for the benefits to be seen. In some cases, up to 10 days. Duration: Steroids will stay in the system x 2 weeks. Duration of benefits will depend on multiple posibilities including persistent irritating factors. Side-effects: If  present, they may typically last 2 weeks (the duration of the steroids). Frequent: Cramps (if they occur, drink Gatorade and take over-the-counter Magnesium 450-500 mg once to twice a day); water retention with temporary weight gain; increases in blood sugar; decreased immune system response; increased appetite. Occasional: Facial flushing (red, warm cheeks); mood swings; menstrual changes. Uncommon: Long-term decrease or suppression of natural hormones;  bone thinning. (These are more common with higher doses or more frequent use. This is why we prefer that our patients avoid having any injection therapies in other practices.)  Very Rare: Severe mood changes; psychosis; aseptic necrosis. From procedure: Some discomfort is to be expected once the numbing medicine wears off. This should be minimal if ice and heat are applied as instructed.  Call if: (When should I call?) You experience numbness and weakness that gets worse with time, as opposed to wearing off. New onset bowel or bladder incontinence. (Applies only to procedures done in the spine)  Emergency Numbers: Durning business hours (Monday - Thursday, 8:00 AM - 4:00 PM) (Friday, 9:00 AM - 12:00 Noon): [phone] After hours: [phone] NOTE: If you are having a problem and are unable connect with, or to talk to a provider, then go to your nearest urgent care or emergency department. If the problem is serious and urgent, please call [phone]. ____________________________________________________________________________________________  ____________________________________________________________________________________________  Virtual Visits   What is a "Virtual Visit"? It is a Metallurgist (medical visit) that takes place on real time (NOT TEXT or E-MAIL) over the telephone or computer device (desktop, laptop, tablet, smart phone, etc.). It allows for more location flexibility between the patient and the healthcare provider.  Who decides when these types of visits will be used? The physician.  Who is eligible for these types of visits? Only those patients that can be reliably reached over the telephone.  What do you mean by reliably? We do not have time to call everyone multiple times, therefore those that tend to screen calls and then call back later are not suitable candidates for this system. We understand how people are reluctant to pickup on "unknown"  calls, therefore, we suggest adding our telephone numbers to your list of "CONTACT(s)". This way, you should be able to readily identify our calls when you receive one. All of our numbers are available below.   Who is not eligible? This option is not available for medication management encounters, specially for controlled substances. Patients on pain medications that fall under the category of controlled substances have to come in for "Face-to-Face" encounters. This is required for mandatory monitoring of these substances. You may be asked to provide a sample for an unannounced urine drug screening test (UDS), and we will need to count your pain pills. Not bringing your pills to be counted may result in no refill. Obviously, neither one of these can be done over the phone.  When will this type of visits be used? You can request a virtual visit whenever you are physically unable to attend a regular appointment. The decision will be made by the physician (or healthcare provider) on a case by case basis.   At what time will I be called? This is an excellent question. The providers will try to call you whenever they have time available. Do not expect to be called at any specific time. The secretaries will assign you a time for your virtual visit appointment, but this is done simply to keep a list of those patients that need to  be called, but not for the purpose of keeping a time schedule. Be advised that the call may come in anytime during the day, between the hours of 8:00 AM and 8::00 PM, depending on provider availability. We do understand that the system is not perfect. If you are unable to be available that day on a moments notice, then request an "in-person" appointment rather than a "virtual visit".  Can I request my medication visits to be "Virtual"? Yes you may request it, but the decision is entirely up to the healthcare provider. Control substances require specific monitoring that requires  Face-to-Face encounters. The number of encounters  and the extent of the monitoring is determined on a case by case basis.  Add a new contact to your smart phone and label it "PAIN CLINIC" Under this contact add the following numbers: Main: [phone] (Official Contact Number) Nurses: [phone] (These are outgoing only calling systems. Do not call this number.) Dr. Dossie Arbour: [phone] or [phone] (Outgoing calls only. Do not call this number.)  ____________________________________________________________________________________________  ____________________________________________________________________________________________  Patient Information update  To: All of our patients.  Re: Name change.  It has been made official that our current name, "[facility]"   will soon be changed to "[facility]".   The purpose of this change is to eliminate any confusion created by the concept of our practice being a "Medication Management Pain Clinic". In the past this has led to the misconception that we treat pain primarily by the use of prescription medications.  Nothing can be farther from the truth.   Understanding PAIN MANAGEMENT: To further understand what our practice does, you first have to understand that "Pain Management" is a subspecialty that requires additional training once a physician has completed their specialty training, which can be in either Anesthesia, Neurology, Psychiatry, or Physical Medicine and Rehabilitation (PMR). Each one of these contributes to the final approach taken by each physician to the management of their patient's pain. To be a "Pain Management Specialist" you must have first completed one of the specialty trainings below.  Anesthesiologists - trained in clinical pharmacology and interventional techniques such as nerve blockade and  regional as well as central neuroanatomy. They are trained to block pain before, during, and after surgical interventions.  Neurologists - trained in the diagnosis and pharmacological treatment of complex neurological conditions, such as Multiple Sclerosis, Parkinson's, spinal cord injuries, and other systemic conditions that may be associated with symptoms that may include but are not limited to pain. They tend to rely primarily on the treatment of chronic pain using prescription medications.  Psychiatrist - trained in conditions affecting the psychosocial wellbeing of patients including but not limited to depression, anxiety, schizophrenia, personality disorders, addiction, and other substance use disorders that may be associated with chronic pain. They tend to rely primarily on the treatment of chronic pain using prescription medications.   Physical Medicine and Rehabilitation (PMR) physicians, also known as physiatrists - trained to treat a wide variety of medical conditions affecting the brain, spinal cord, nerves, bones, joints, ligaments, muscles, and tendons. Their training is primarily aimed at treating patients that have suffered injuries that have caused severe physical impairment. Their training is primarily aimed at the physical therapy and rehabilitation of those patients. They may also work alongside orthopedic surgeons or neurosurgeons using their expertise in assisting surgical patients to recover after their surgeries.  INTERVENTIONAL PAIN MANAGEMENT is sub-subspecialty  of Pain Management.  Our physicians are Board-certified in Anesthesia, Pain Management, and Interventional Pain Management.  This meaning that not only have they been trained and Board-certified in their specialty of Anesthesia, and subspecialty of Pain Management, but they have also received further training in the sub-subspecialty of Interventional Pain Management, in order to become Board-certified as INTERVENTIONAL PAIN  MANAGEMENT SPECIALIST.    Mission: Our goal is to use our skills in  [place] as alternatives to the chronic use of prescription opioid medications for the treatment of pain. To make this more clear, we have changed our name to reflect what we do and offer. We will continue to offer medication management assessment and recommendations, but we will not be taking over any patient's medication management.  ____________________________________________________________________________________________

## 2021-12-17 ENCOUNTER — Telehealth: Payer: Self-pay

## 2021-12-17 NOTE — Telephone Encounter (Signed)
Post procedure phone call. Patient states he is doing well.  

## 2021-12-20 ENCOUNTER — Other Ambulatory Visit: Payer: Self-pay | Admitting: Family Medicine

## 2021-12-20 DIAGNOSIS — E1142 Type 2 diabetes mellitus with diabetic polyneuropathy: Secondary | ICD-10-CM

## 2021-12-22 ENCOUNTER — Telehealth: Payer: Self-pay | Admitting: Student in an Organized Health Care Education/Training Program

## 2021-12-22 NOTE — Telephone Encounter (Signed)
PT stated that he has been having a lot of pain since the proced. Please give patient a call. Thanks

## 2021-12-22 NOTE — Telephone Encounter (Signed)
Patient states this happened on Friday late afternoon and that is was a sudden onset of pain in his hip as he was going to  and his pain has since decreased. Instructed him to call PCP or go to ED if it recurs.

## 2021-12-25 DIAGNOSIS — E1165 Type 2 diabetes mellitus with hyperglycemia: Secondary | ICD-10-CM | POA: Diagnosis not present

## 2021-12-27 NOTE — Progress Notes (Signed)
Patient: Alan Schmidt  Service Category: E/M  Provider: Gaspar Cola, MD  DOB: [birth year]  DOS: 12/30/2021  Location: Office  MRN: [id number]  Setting: Ambulatory outpatient  Referring Provider: [name], MD  Type: Established Patient  Specialty: Interventional Pain Management  PCP: [name], MD  Location: Remote location  Delivery: TeleHealth     Virtual Encounter - Pain Management PROVIDER NOTE: Information contained herein reflects review and annotations entered in association with encounter. Interpretation of such information and data should be left to medically-trained personnel. Information provided to patient can be located elsewhere in the medical record under "Patient Instructions". Document created using STT-dictation technology, any transcriptional errors that may result from process are unintentional.    Contact & Pharmacy Preferred: [phone] Home: [phone] (home) Mobile: There is no such number on file (mobile). E-mail: [email or web address]  [facility] [place], [address] [place] [address] Phone: [phone] Fax: [phone]  [id number] [place], [address]Phone: [phone]: [phone]  Pre-screening  Mr. TGrandville Schmidt "in-person" vs "virtual" encounter. He indicated preferring virtual for this encounter.   Reason COVID-19*  Social distancing based on CDC and AMA recommendations.   I contacted HRoselee Culveron 12/30/2021 via telephone.      I clearly identified myself as FGaspar Cola MD. I verified that I was speaking with the correct person using two identifiers (Name: Alan Schmidt and date of birth: [id number].  Consent I sought verbal advanced consent from HRoselee Culverfor virtual visit interactions. I informed [name] possible security and privacy concerns,  risks, and limitations associated with providing "not-in-person" medical evaluation and management services. I also informed [name] the availability of "in-person" appointments. Finally, I informed him that there would be a charge for the virtual visit and that he could be  personally, fully or partially, financially responsible for it. Mr. TCheeverexpressed understanding and agreed to proceed.   Historic Elements   Mr. HFREDICK SCHLOSSERis a 76y.o. year old, male patient evaluated today after our last contact on 12/16/2021. [name] has a past medical history of Acute postoperative pain (11/24/2016), Anxiety, Chronic hip pain (Right) (12/05/2014), Chronic lumbar pain, Depression, Diabetes mellitus without complication ([place], Hyperlipidemia, Hypertension, Kidney stones, and Migraines. He also  has a past surgical history that includes Total hip arthroplasty; Tonsillectomy; and right hip surgery. [name] a current medication list which includes the following prescription(s): accu-chek guide, acetaminophen, alprazolam, aspirin ec, bupropion, carvedilol, freestyle libre 14 day reader, freestyle libre 14 day sensor, desoximetasone, empagliflozin, epinephrine, escitalopram, gabapentin, hydrochlorothiazide, insulin lispro, bd pen needle nano u/f, loratadine, lubiprostone, metformin, methocarbamol, mometasone, morphine, naloxone, olmesartan, ozempic (2 mg/dose), pantoprazole, rosuvastatin, sennosides-docusate sodium, tamsulosin, mounjaro, tresiba flextouch, morphine, morphine, and benefiber. He  reports that he has quit smoking. His smoking use included cigarettes. He has never used smokeless tobacco. He reports that he does not drink alcohol and does not use drugs. [name] allergic to contrast media [iodinated contrast media], iodine, and shellfish allergy.   HPI  Today, he is being contacted for a post-procedure assessment.  Post-procedure evaluation   Type: Lumbar Facet, Medial  Branch Block(s) #3  Laterality: Bilateral  Level: L3, L4, L5, & S1 Medial Branch Level(s). Injecting these levels blocks the L4-5 and L5-S1 lumbar facet joints. Imaging: Fluoroscopic guidance  Anesthesia: Local anesthesia (1-2% Lidocaine) Anxiolysis: IV Versed         Sedation: Moderate Sedation                       DOS: 12/16/2021 Performed by: Gaspar Cola, MD  Primary Purpose: Diagnostic/Therapeutic Indications: Low back pain severe enough to impact quality of life or function. 1. Lumbar facet syndrome (Bilateral) (R>L)   2. Spondylosis without myelopathy or radiculopathy, lumbosacral region   3. DDD (degenerative disc disease), lumbar   4. Lumbar facet joint osteoarthritis (Bilateral)   5. Chronic low back pain (1ry area of Pain) (Bilateral) w/o sciatica   6. History of allergy to radiographic contrast media   7. History of allergy to povidone-iodine topical antiseptic   8. History of Vasovagal response to spinal injections    NAS-11 Pain score:   Pre-procedure: 3 /10   Post-procedure: 0-No pain/10     Effectiveness:  Initial hour after procedure: 100 %. Subsequent 4-6 hours post-procedure: 100 %. Analgesia past initial 6 hours: 80 % (ongoing). Ongoing improvement:  Analgesic: Patient refers having attained an ongoing 80% improvement of his low back pain. Function: Mr. Schmidt reports improvement in function ROM: Alan Schmidt reports improvement in ROM  Pharmacotherapy Assessment   Opioid Analgesic: Morphine ER (MS Contin) 30 mg, 1 tab p.o. twice daily (60 mg/day).  (Previously on oxycodone IR 10 mg every 6 hours (40 mg/day)) MME/day: 60 mg/day.   Monitoring: [place] PMP: PDMP reviewed during this encounter.       Pharmacotherapy: No side-effects or adverse reactions reported. Compliance: No problems identified. Effectiveness: Clinically acceptable. Plan: Refer to "POC". UDS:  Summary  Date Value Ref Range Status  09/29/2021 Note  Final    Comment:     ==================================================================== [employer] Select 13 (MW) ==================================================================== Test                             Result       Flag       Units  Drug Present and Declared for Prescription Verification   Alprazolam                     109          EXPECTED   ng/mg creat   Alpha-hydroxyalprazolam        314          EXPECTED   ng/mg creat    Source of alprazolam is a scheduled prescription medication. Alpha-    hydroxyalprazolam is an expected metabolite of alprazolam.    Morphine                       >9709        EXPECTED   ng/mg creat   Normorphine                    290          EXPECTED   ng/mg creat    Potential sources of large amounts of morphine in the absence of    codeine include administration of morphine or use of heroin.     Normorphine is an expected metabolite of morphine.    Hydromorphone                  236          EXPECTED   ng/mg creat  Hydromorphone may be present as a metabolite of morphine;    concentrations of hydromorphone rarely exceed 5% of the morphine    concentration when this is the source of hydromorphone.  ==================================================================== Test                      Result    Flag   Units      Ref Range   Creatinine              103              mg/dL      >=20 ==================================================================== Declared Medications:  The flagging and interpretation on this report are based on the  following declared medications.  Unexpected results may arise from  inaccuracies in the declared medications.   **Note: The testing scope of this panel includes these medications:   Alprazolam (Xanax)  Morphine (MS Contin)   **Note: The testing scope of this panel does not include the  following reported medications:   Acetaminophen (Tylenol)  Aspirin  Bupropion (Wellbutrin)  Carvedilol (Coreg)  Desoximetasone  (Topicort)  Empagliflozin (Jardiance)  Epinephrine (EpiPen)  Escitalopram (Lexapro)  Gabapentin (Neurontin)  Hydrochlorothiazide (Hydrodiuril)  Insulin Tyler Aas)  Loratadine (Claritin)  Lubiprostone (Amitiza)  Metformin (Glucophage)  Methocarbamol (Robaxin)  Mometasone (Nasonex)  Naloxone (Narcan)  Olmesartan (Benicar)  Pantoprazole (Protonix)  Rosuvastatin (Crestor)  Sennosides (Senokot)  Supplement (Fiber)  Tamsulosin (Flomax)  Tirzepatide (Mounjaro) ==================================================================== For clinical consultation, please call [phone]. ====================================================================    No results found for: "CBDTHCR", "D8THCCBX", "D9THCCBX"   Laboratory Chemistry Profile   Renal Lab Results  Component Value Date   BUN 12 04/25/2021   CREATININE 1.02 21/22/4825   BCR NOT APPLICABLE 00/37/0488   GFR 76.84 09/30/2020   GFRAA >60 03/26/2018   GFRNONAA >60 03/26/2018    Hepatic Lab Results  Component Value Date   AST 18 06/17/2021   ALT 26 06/17/2021   ALBUMIN 3.9 06/17/2021   ALKPHOS 71 06/17/2021    Electrolytes Lab Results  Component Value Date   NA 140 04/25/2021   K 4.0 04/25/2021   CL 101 04/25/2021   CALCIUM 9.7 04/25/2021   MG 1.8 03/06/2015    Bone No results found for: "VD25OH", "VD125OH2TOT", "QB1694HW3", "UU8280KL4", "25OHVITD1", "25OHVITD2", "91PHXTAV6", "TESTOFREE", "TESTOSTERONE"  Inflammation (CRP: Acute Phase) (ESR: Chronic Phase) Lab Results  Component Value Date   CRP 0.5 03/06/2015   ESRSEDRATE 45 (H) 12/14/2016         Note: Above Lab results reviewed.  Imaging  DG PAIN CLINIC C-ARM 1-60 MIN NO REPORT Fluoro was used, but no Radiologist interpretation will be provided.  Please refer to "NOTES" tab for provider progress note.  Assessment  The primary encounter diagnosis was Lumbar facet syndrome (Bilateral) (R>L). Diagnoses of Chronic low back pain (1ry area of Pain)  (Bilateral) w/o sciatica, Chronic lower extremity pain (2ry area of Pain) (Bilateral) (R>L), and Chronic pain syndrome were also pertinent to this visit.  Plan of Care  Problem-specific:  No problem-specific Assessment & Plan notes found for this encounter.  Mr. YOMAR MEJORADO has a current medication list which includes the following long-term medication(s): bupropion, carvedilol, escitalopram, gabapentin, hydrochlorothiazide, insulin lispro, loratadine, lubiprostone, metformin, mometasone, morphine, olmesartan, pantoprazole, rosuvastatin, morphine, morphine, and benefiber.  Pharmacotherapy (Medications Ordered): No orders of the defined types were placed in this encounter.  Orders:  No orders of the defined types were placed in this encounter.  Follow-up plan:   No follow-ups on  file.     Interventional Therapies  Risk  Complexity Considerations:   Estimated body mass index is 32.98 kg/m as calculated from the following:   Height as of 09/29/21: _0  (1.854 m).   Weight as of 09/29/21: 250 lb (113.4 kg).     NO MORE RFA - intraoperative noncompliance, unnecessarily increasing radiation exposure NOTE: NO MORE RFA procedures. Difficulty following intra-procedural instructions and commands, resulting in unnecessarily long exposure of staff to radiation.   Planned  Pending:      Under consideration:   Diagnostic Left GONB   Completed:   Therapeutic bilateral lumbar facet MBB x3 (12/16/2021) (100/100/80/80)  Therapeutic left lumbar facet RFA x2 (05/09/2019) (90/90/77/>75)  Therapeutic right lumbar facet RFA x6 (12/14/2019) (100/100/70/70)  Diagnostic/therapeutic right small joint inj. (thumb) x1 (03/15/2018) (N/A)  Diagnostic/therapeutic left cervical ESI x1 (12/18/2015) (90/70/50/50)    Completed by other providers:   None at this time   Therapeutic  Palliative (PRN) options:   Palliative lumbar facet MBB  Therapeutic cervical ESI    Pharmacological Recommendations:    Nonopioids transferred 12/14/2019: Gabapentin, Amitiza, Benefiber, Mobic      Recent Visits Date Type Provider Dept  12/16/21 Procedure visit Milinda Pointer, MD [facility]  12/01/21 Office Visit Milinda Pointer, MD [facility]  Showing recent visits within past 90 days and meeting all other requirements Today's Visits Date Type Provider Dept  12/30/21 Office Visit Milinda Pointer, MD [facility]  Showing today's visits and meeting all other requirements Future Appointments Date Type Provider Dept  12/31/21 Appointment Milinda Pointer, MD [facility]  Showing future appointments within next 90 days and meeting all other requirements  I discussed the assessment and treatment plan with the patient. The patient was provided an opportunity to ask questions and all were answered. The patient agreed with the plan and demonstrated an understanding of the instructions.  Patient advised to call back or seek an in-person evaluation if the symptoms or condition worsens.  Duration of encounter: 18 minutes.  Note by: Gaspar Cola, MD Date: 12/30/2021; Time: 11:46 AM

## 2021-12-27 NOTE — Progress Notes (Signed)
PROVIDER NOTE: Information contained herein reflects review and annotations entered in association with encounter. Interpretation of such information and data should be left to medically-trained personnel. Information provided to patient can be located elsewhere in the medical record under "Patient Instructions". Document created using STT-dictation technology, any transcriptional errors that may result from process are unintentional.    Patient: Alan Schmidt  Service Category: E/M  Provider: Gaspar Cola, MD  DOB: 11/13/1945  DOS: 12/31/2021  Referring Provider: [name], MD  MRN: [id number]  Specialty: Interventional Pain Management  PCP: [name], MD  Type: Established Patient  Setting: Ambulatory outpatient    Location: Office  Delivery: Face-to-face     HPI  [name], a 76 y.o. year old male, is here today because of his Chronic pain syndrome [G89.4]. Mr. Bunyard primary complain today is Back Pain (lower) Last encounter: My last encounter with him was on 12/30/2021. Pertinent problems: Mr. Holberg has Chronic low back pain (Bilateral) (R>L) w/ sciatica (Bilateral); Lumbar facet syndrome (Bilateral) (R>L); Lumbar spondylosis; Diabetic peripheral neuropathy ([place]); Chronic neck pain (midline over the C7 spinous processes) (L>R); Neurogenic pain; Neuropathic pain; Chronic lower extremity pain (2ry area of Pain) (Bilateral) (R>L); 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) (Right L5 dermatome); History of total hip replacement (Right); Chronic hip pain (Right); Abnormal nerve conduction studies (severe bilateral lower extremity polyneuropathy); Chronic shoulder pain (Bilateral) (R>L); Occipital neuralgia (Left); Cervicogenic headache (Left); Chronic shoulder radicular pain (Left); Chronic cervical radicular pain (Left); Chronic pain syndrome; Lumbar facet joint osteoarthritis (Bilateral); Chronic headache; Chronic tension-type headache, intractable;  Coccygodynia; DDD (degenerative disc disease), lumbar; Chronic musculoskeletal pain; Chronic knee pain (Bilateral); Thumb pain (Right); 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 Pain Assessment: Severity of Chronic pain is reported as a 3 /10. Location: Back Right, Left/Denies. Onset: More than a month ago. Quality: Aching, Burning, Constant, Throbbing. Timing: Constant. Modifying factor(s): meds and laying. Vitals:  weight is 250 lb (113.4 kg). His temperature is 97.2 F (36.2 C) (abnormal). His blood pressure is 130/74 and his pulse is 84. His oxygen saturation is 98%.   Reason for encounter: medication management.  The patient indicates doing well with the current medication regimen. No adverse reactions or side effects reported to the medications.   The patient stated that approximately 40 minutes before coming in, while he was at home, he tripped and almost fell.  He stopped himself from falling down so he thinks that he did not injure anything.  However, he indicates that he is feeling his back getting a little tight.  He took 2 Advil's approximately 30 minutes ago and therefore is really not a good candidate for an IM injection of Toradol at this time.  He refers that other than his refills, he does not need anything else today since he is doing fairly well after the lumbar facet blocks.  RTCB: 04/06/2022  Nonopioids transferred 12/14/2019: Gabapentin, Amitiza, Benefiber, Mobic  Pharmacotherapy Assessment  Analgesic: Morphine ER (MS Contin) 30 mg, 1 tab p.o. twice daily (60 mg/day).  (Previously on oxycodone IR 10  mg every 6 hours (40 mg/day)) MME/day: 60 mg/day.   Monitoring: [place] PMP: PDMP reviewed during this encounter.  Pharmacotherapy: No side-effects or adverse reactions reported. Compliance: No problems identified. Effectiveness: Clinically acceptable.  Chauncey Fischer, RN  12/31/2021 12:35 PM  Sign when Signing Visit Nursing Pain Medication Assessment:  Safety precautions to be maintained throughout the outpatient stay will include: orient to surroundings, keep bed in low position, maintain call bell within reach at all times, provide assistance with transfer out of bed and ambulation.  Medication Inspection Compliance: Pill count conducted under aseptic conditions, in front of the patient. Neither the pills nor the bottle was removed from the patient's sight at any time. Once count was completed pills were immediately returned to the patient in their original bottle.  Medication: Morphine ER (MSContin) Pill/Patch Count:  14 of 60 pills remain Pill/Patch Appearance: Markings consistent with prescribed medication Bottle Appearance: Standard pharmacy container. Clearly labeled. Filled Date: 8 / 7 / 2023 Last Medication intake:  TodaySafety precautions to be maintained throughout the outpatient stay will include: orient to surroundings, keep bed in low position, maintain call bell within reach at all times, provide assistance with transfer out of bed and ambulation.     No results found for: "CBDTHCR" No results found for: "D8THCCBX" No results found for: "D9THCCBX"  UDS:  Summary  Date Value Ref Range Status  09/29/2021 Note  Final    Comment:    ==================================================================== [employer] (MW) ==================================================================== Test                             Result       Flag       Units  Drug Present and Declared for Prescription Verification   Alprazolam                     109          EXPECTED    ng/mg creat   Alpha-hydroxyalprazolam        314          EXPECTED   ng/mg creat    Source of alprazolam is a scheduled prescription medication. Alpha-    hydroxyalprazolam is an expected metabolite of alprazolam.    Morphine                       >9709        EXPECTED   ng/mg creat   Normorphine                    290          EXPECTED   ng/mg creat    Potential sources of large amounts of morphine in the absence of    codeine include administration of morphine or use of heroin.     Normorphine is an expected metabolite of morphine.    Hydromorphone                  236          EXPECTED   ng/mg creat    Hydromorphone may be present as a metabolite of morphine;    concentrations of hydromorphone rarely exceed 5% of the morphine    concentration when this is the source of hydromorphone.  ==================================================================== Test                      Result    Flag   Units      Ref Range   Creatinine  103              mg/dL      >=20 ==================================================================== Declared Medications:  The flagging and interpretation on this report are based on the  following declared medications.  Unexpected results may arise from  inaccuracies in the declared medications.   **Note: The testing scope of this panel includes these medications:   Alprazolam (Xanax)  Morphine (MS Contin)   **Note: The testing scope of this panel does not include the  following reported medications:   Acetaminophen (Tylenol)  Aspirin  Bupropion (Wellbutrin)  Carvedilol (Coreg)  Desoximetasone (Topicort)  Empagliflozin (Jardiance)  Epinephrine (EpiPen)  Escitalopram (Lexapro)  Gabapentin (Neurontin)  Hydrochlorothiazide (Hydrodiuril)  Insulin Tyler Aas)  Loratadine (Claritin)  Lubiprostone (Amitiza)  Metformin (Glucophage)  Methocarbamol (Robaxin)  Mometasone (Nasonex)  Naloxone (Narcan)  Olmesartan (Benicar)  Pantoprazole  (Protonix)  Rosuvastatin (Crestor)  Sennosides (Senokot)  Supplement (Fiber)  Tamsulosin (Flomax)  Tirzepatide (Mounjaro) ==================================================================== For clinical consultation, please call [phone]. ====================================================================       ROS  Constitutional: Denies any fever or chills Gastrointestinal: No reported hemesis, hematochezia, vomiting, or acute GI distress Musculoskeletal: Denies any acute onset joint swelling, redness, loss of ROM, or weakness Neurological: No reported episodes of acute onset apraxia, aphasia, dysarthria, agnosia, amnesia, paralysis, loss of coordination, or loss of consciousness  Medication Review  ALPRAZolam, Benefiber, EPINEPHrine, FreeStyle Libre 14 Day Reader, FreeStyle Libre 14 Day Sensor, Insulin Pen Needle, Semaglutide (2 MG/DOSE), acetaminophen, aspirin EC, buPROPion, carvedilol, desoximetasone, empagliflozin, escitalopram, gabapentin, glucose blood, hydrochlorothiazide, insulin degludec, insulin lispro, loratadine, lubiprostone, metFORMIN, methocarbamol, mometasone, morphine, naloxone, olmesartan, pantoprazole, rosuvastatin, sennosides-docusate sodium, tamsulosin, and tirzepatide  History Review  Allergy: Mr. Petrelli is allergic to contrast media [iodinated contrast media], iodine, and shellfish allergy. Drug: Mr. Haub  reports no history of drug use. Alcohol:  reports no history of alcohol use. Tobacco:  reports that he has quit smoking. His smoking use included cigarettes. He has never used smokeless tobacco. Social: Mr. Stork  reports that he has quit smoking. His smoking use included cigarettes. He has never used smokeless tobacco. He reports that he does not drink alcohol and does not use drugs. Medical:  has a past medical history of Acute postoperative pain (11/24/2016), Anxiety, Chronic hip pain (Right) (12/05/2014), Chronic lumbar pain, Depression,  Diabetes mellitus without complication ([place]), Hyperlipidemia, Hypertension, Kidney stones, and Migraines. Surgical: Mr. Raucci  has a past surgical history that includes Total hip arthroplasty; Tonsillectomy; and right hip surgery. Family: family history includes Cancer in his mother; Diabetes in his father, sister, and sister; Heart disease in his father; Stroke in his father.  Laboratory Chemistry Profile   Renal Lab Results  Component Value Date   BUN 12 04/25/2021   CREATININE 1.02 76/54/6503   BCR NOT APPLICABLE 54/65/6812   GFR 76.84 09/30/2020   GFRAA >60 03/26/2018   GFRNONAA >60 03/26/2018    Hepatic Lab Results  Component Value Date   AST 18 06/17/2021   ALT 26 06/17/2021   ALBUMIN 3.9 06/17/2021   ALKPHOS 71 06/17/2021    Electrolytes Lab Results  Component Value Date   NA 140 04/25/2021   K 4.0 04/25/2021   CL 101 04/25/2021   CALCIUM 9.7 04/25/2021   MG 1.8 03/06/2015    Bone No results found for: "VD25OH", "VD125OH2TOT", "XN1700FV4", "BS4967RF1", "25OHVITD1", "25OHVITD2", "63WGYKZL9", "TESTOFREE", "TESTOSTERONE"  Inflammation (CRP: Acute Phase) (ESR: Chronic Phase) Lab Results  Component Value Date   CRP 0.5 03/06/2015   ESRSEDRATE 45 (H) 12/14/2016  Note: Above Lab results reviewed.  Recent Imaging Review  DG PAIN CLINIC C-ARM 1-60 MIN NO REPORT Fluoro was used, but no Radiologist interpretation will be provided.  Please refer to "NOTES" tab for provider progress note. Note: Reviewed        Physical Exam  General appearance: Well nourished, well developed, and well hydrated. In no apparent acute distress Mental status: Alert, oriented x 3 (person, place, & time)       Respiratory: No evidence of acute respiratory distress Eyes: PERLA Vitals: BP 130/74   Pulse 84   Temp (!) 97.2 F (36.2 C)   Wt 250 lb (113.4 kg)   SpO2 98%   BMI 32.98 kg/m  BMI: Estimated body mass index is 32.98 kg/m as calculated from the following:   Height as  of 12/16/21: _0  (1.854 m).   Weight as of this encounter: 250 lb (113.4 kg). Ideal: Ideal body weight: 79.9 kg (176 lb 2.4 oz) Adjusted ideal body weight: 93.3 kg (205 lb 11 oz)  Assessment   Diagnosis Status  1. Chronic pain syndrome   2. Chronic low back pain (1ry area of Pain) (Bilateral) w/o sciatica   3. Chronic lower extremity pain (2ry area of Pain) (Bilateral) (R>L)   4. DDD (degenerative disc disease), cervical   5. DDD (degenerative disc disease), lumbar   6. Chronic neck pain (midline over the C7 spinous processes) (L>R)   7. Lumbar facet syndrome (Bilateral) (R>L)   8. Chronic knee pain (Bilateral)   9. 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 Pharmacologic therapy   11. Chronic use of opiate for therapeutic purpose   12. Encounter for medication management   13. Encounter for chronic pain management    Controlled Controlled Controlled   Updated Problems: No problems updated.  Plan of Care  Problem-specific:  No problem-specific Assessment & Plan notes found for this encounter.  Mr. ALYAN HARTLINE has a current medication list which includes the following long-term medication(s): bupropion, carvedilol, escitalopram, gabapentin, hydrochlorothiazide, insulin lispro, loratadine, lubiprostone, metformin, mometasone, [START ON 01/06/2022] morphine, [START ON 02/05/2022] morphine, [START ON 03/07/2022] morphine, olmesartan, pantoprazole, rosuvastatin, and benefiber.  Pharmacotherapy (Medications Ordered): Meds ordered this encounter  Medications   morphine (MS CONTIN) 30 MG 12 hr tablet    Sig: Take 1 tablet (30 mg total) by mouth every 12 (twelve) hours. Must last 30 days. Do not break tablet    Dispense:  60 tablet    Refill:  0    DO NOT: delete (not duplicate); no partial-fill (will deny script to complete), no refill request (F/U required). DISPENSE: 1 day early if closed on fill date. WARN: No CNS-depressants within 8 hrs of med.    morphine (MS CONTIN) 30 MG 12 hr tablet    Sig: Take 1 tablet (30 mg total) by mouth every 12 (twelve) hours. Must last 30 days. Do not break tablet    Dispense:  60 tablet    Refill:  0    DO NOT: delete (not duplicate); no partial-fill (will deny script to complete), no refill request (F/U required). DISPENSE: 1 day early if closed on fill date. WARN: No CNS-depressants within 8 hrs of med.   morphine (MS CONTIN) 30 MG 12 hr tablet    Sig: Take 1 tablet (30 mg total) by mouth every 12 (twelve) hours. Must last 30 days. Do not break tablet    Dispense:  60 tablet    Refill:  0    DO NOT: delete (not duplicate); no partial-fill (will deny script to complete), no refill request (F/U required). DISPENSE: 1 day early if closed on fill date. WARN: No CNS-depressants within 8 hrs of med.   Orders:  No orders of the defined types were placed in this encounter.  Follow-up plan:   No follow-ups on file.     Interventional Therapies  Risk  Complexity Considerations:   Estimated body mass index is 32.98 kg/m as calculated from the following:   Height as of 09/29/21: _0  (1.854 m).   Weight as of 09/29/21: 250 lb (113.4 kg).     NO MORE RFA - intraoperative noncompliance, unnecessarily increasing radiation exposure NOTE: NO MORE RFA procedures. Difficulty following intra-procedural instructions and commands, resulting in unnecessarily long exposure of staff to radiation.   Planned  Pending:   Therapeutic bilateral lumbar facet MBB #3 (12/16/21)   Under consideration:   Diagnostic Left GONB   Completed:   Therapeutic left lumbar facet RFA x2 (05/09/2019) (90/90/77/>75)  Therapeutic right lumbar facet RFA x6 (12/14/2019) (100/100/70/70)  Diagnostic/therapeutic right small joint inj. (thumb) x1 (03/15/2018) (N/A)  Diagnostic/therapeutic left cervical ESI x1 (12/18/2015) (90/70/50/50)    Completed by other providers:   None at this time   Therapeutic  Palliative (PRN) options:    Palliative lumbar facet MBB  Therapeutic cervical ESI    Pharmacotherapy:  Nonopioids transferred 12/14/2019: Gabapentin, Amitiza, Benefiber, Mobic Recommendations:   None at this time.     Recent Visits Date Type Provider Dept  12/30/21 Office Visit Milinda Pointer, MD [facility]  12/16/21 Procedure visit Milinda Pointer, MD [facility]  12/01/21 Office Visit Milinda Pointer, MD [facility]  Showing recent visits within past 90 days and meeting all other requirements Today's Visits Date Type Provider Dept  12/31/21 Office Visit Milinda Pointer, MD [facility]  Showing today's visits and meeting all other requirements Future Appointments Date Type Provider Dept  03/25/22 Appointment Milinda Pointer, MD [facility]  Showing future appointments within next 90 days and meeting all other requirements  I discussed the assessment and treatment plan with the patient. The patient was provided an opportunity to ask questions and all were answered. The patient agreed with the plan and demonstrated an understanding of the instructions.  Patient advised to call back or seek an in-person evaluation if the symptoms or condition worsens.  Duration of encounter: 30 minutes.  Total time on encounter, as per AMA guidelines included both the face-to-face and non-face-to-face time personally spent by the physician and/or other qualified health care professional(s) on the day of the encounter (includes time in activities that require the physician or other qualified health care professional and does not include time in activities normally performed by clinical staff). Physician's time may include the following activities when performed: preparing to see the patient (eg, review of tests, pre-charting review of records) obtaining and/or reviewing separately obtained history performing a medically appropriate examination and/or  evaluation counseling and educating the patient/family/caregiver ordering medications, tests, or procedures referring and communicating with other health care professionals (when not separately reported) documenting clinical information in the electronic or other health record independently interpreting results (not separately reported) and communicating results to the patient/ family/caregiver care coordination (not separately reported)  Note by: Gaspar Cola, MD Date: 12/31/2021; Time: 12:59 PM

## 2021-12-30 ENCOUNTER — Ambulatory Visit: Payer: Medicare Other | Attending: Pain Medicine | Admitting: Pain Medicine

## 2021-12-30 DIAGNOSIS — M545 Low back pain, unspecified: Secondary | ICD-10-CM

## 2021-12-30 DIAGNOSIS — M47816 Spondylosis without myelopathy or radiculopathy, lumbar region: Secondary | ICD-10-CM

## 2021-12-30 DIAGNOSIS — G8929 Other chronic pain: Secondary | ICD-10-CM

## 2021-12-30 DIAGNOSIS — M79605 Pain in left leg: Secondary | ICD-10-CM

## 2021-12-30 DIAGNOSIS — G894 Chronic pain syndrome: Secondary | ICD-10-CM

## 2021-12-30 DIAGNOSIS — M79604 Pain in right leg: Secondary | ICD-10-CM | POA: Diagnosis not present

## 2021-12-31 ENCOUNTER — Ambulatory Visit: Payer: Medicare Other | Attending: Pain Medicine | Admitting: Pain Medicine

## 2021-12-31 ENCOUNTER — Encounter: Payer: Self-pay | Admitting: Pain Medicine

## 2021-12-31 VITALS — BP 130/74 | HR 84 | Temp 97.2°F | Wt 250.0 lb

## 2021-12-31 DIAGNOSIS — M25562 Pain in left knee: Secondary | ICD-10-CM

## 2021-12-31 DIAGNOSIS — M545 Low back pain, unspecified: Secondary | ICD-10-CM | POA: Diagnosis not present

## 2021-12-31 DIAGNOSIS — M79605 Pain in left leg: Secondary | ICD-10-CM | POA: Diagnosis not present

## 2021-12-31 DIAGNOSIS — M5030 Other cervical disc degeneration, unspecified cervical region: Secondary | ICD-10-CM | POA: Diagnosis not present

## 2021-12-31 DIAGNOSIS — G894 Chronic pain syndrome: Secondary | ICD-10-CM

## 2021-12-31 DIAGNOSIS — Z96641 Presence of right artificial hip joint: Secondary | ICD-10-CM | POA: Diagnosis not present

## 2021-12-31 DIAGNOSIS — Z79891 Long term (current) use of opiate analgesic: Secondary | ICD-10-CM

## 2021-12-31 DIAGNOSIS — M25561 Pain in right knee: Secondary | ICD-10-CM | POA: Diagnosis not present

## 2021-12-31 DIAGNOSIS — Z79899 Other long term (current) drug therapy: Secondary | ICD-10-CM | POA: Diagnosis not present

## 2021-12-31 DIAGNOSIS — M542 Cervicalgia: Secondary | ICD-10-CM | POA: Diagnosis not present

## 2021-12-31 DIAGNOSIS — M25551 Pain in right hip: Secondary | ICD-10-CM | POA: Diagnosis not present

## 2021-12-31 DIAGNOSIS — M79604 Pain in right leg: Secondary | ICD-10-CM

## 2021-12-31 DIAGNOSIS — M5136 Other intervertebral disc degeneration, lumbar region: Secondary | ICD-10-CM

## 2021-12-31 DIAGNOSIS — G8929 Other chronic pain: Secondary | ICD-10-CM

## 2021-12-31 DIAGNOSIS — M47816 Spondylosis without myelopathy or radiculopathy, lumbar region: Secondary | ICD-10-CM | POA: Diagnosis not present

## 2021-12-31 MED ORDER — MORPHINE SULFATE ER 30 MG PO TBCR: 30.0000 mg | EXTENDED_RELEASE_TABLET | Freq: Two times a day (BID) | ORAL | 0 refills | Status: DC

## 2021-12-31 NOTE — Progress Notes (Signed)
Nursing Pain Medication Assessment:  Safety precautions to be maintained throughout the outpatient stay will include: orient to surroundings, keep bed in low position, maintain call bell within reach at all times, provide assistance with transfer out of bed and ambulation.  Medication Inspection Compliance: Pill count conducted under aseptic conditions, in front of the patient. Neither the pills nor the bottle was removed from the patient's sight at any time. Once count was completed pills were immediately returned to the patient in their original bottle.  Medication: Morphine ER (MSContin) Pill/Patch Count:  14 of 60 pills remain Pill/Patch Appearance: Markings consistent with prescribed medication Bottle Appearance: Standard pharmacy container. Clearly labeled. Filled Date: 71 / 7 / 2023 Last Medication intake:  TodaySafety precautions to be maintained throughout the outpatient stay will include: orient to surroundings, keep bed in low position, maintain call bell within reach at all times, provide assistance with transfer out of bed and ambulation.

## 2021-12-31 NOTE — Patient Instructions (Signed)
____________________________________________________________________________________________  Patient Information update  To: All of our patients.  Re: Name change.  It has been made official that our current name, "[facility]"   will soon be changed to "[facility]   The purpose of this change is to eliminate any confusion created by the concept of our practice being a "Medication Management Pain Clinic". In the past this has led to the misconception that we treat pain primarily by the use of prescription medications.  Nothing can be farther from the truth.   Understanding PAIN MANAGEMENT: To further understand what our practice does, you first have to understand that "Pain Management" is a subspecialty that requires additional training once a physician has completed their specialty training, which can be in either Anesthesia, Neurology, Psychiatry, or Physical Medicine and Rehabilitation (PMR). Each one of these contributes to the final approach taken by each physician to the management of their patient's pain. To be a "Pain Management Specialist" you must have first completed one of the specialty trainings below.  Anesthesiologists - trained in clinical pharmacology and interventional techniques such as nerve blockade and regional as well as central neuroanatomy. They are trained to block pain before, during, and after surgical interventions.  Neurologists - trained in the diagnosis and pharmacological treatment of complex neurological conditions, such as Multiple Sclerosis, Parkinson's, spinal cord injuries, and other systemic conditions that may be associated with symptoms that may include but are not limited to pain. They tend to rely primarily on the treatment of chronic pain using prescription medications.  Psychiatrist - trained in conditions affecting the psychosocial  wellbeing of patients including but not limited to depression, anxiety, schizophrenia, personality disorders, addiction, and other substance use disorders that may be associated with chronic pain. They tend to rely primarily on the treatment of chronic pain using prescription medications.   Physical Medicine and Rehabilitation (PMR) physicians, also known as physiatrists - trained to treat a wide variety of medical conditions affecting the brain, spinal cord, nerves, bones, joints, ligaments, muscles, and tendons. Their training is primarily aimed at treating patients that have suffered injuries that have caused severe physical impairment. Their training is primarily aimed at the physical therapy and rehabilitation of those patients. They may also work alongside orthopedic surgeons or neurosurgeons using their expertise in assisting surgical patients to recover after their surgeries.  INTERVENTIONAL PAIN MANAGEMENT is sub-subspecialty of Pain Management.  Our physicians are Board-certified in Anesthesia, Pain Management, and Interventional Pain Management.  This meaning that not only have they been trained and Board-certified in their specialty of Anesthesia, and subspecialty of Pain Management, but they have also received further training in the sub-subspecialty of Interventional Pain Management, in order to become Board-certified as INTERVENTIONAL PAIN MANAGEMENT SPECIALIST.    Mission: Our goal is to use our skills in  INTERVENTIONAL PAIN MANAGEMENT as alternatives to the chronic use of prescription opioid medications for the treatment of pain. To make this more clear, we have changed our name to reflect what we do and offer. We will continue to offer medication management assessment and recommendations, but we will not be taking over any patient's medication management.  ____________________________________________________________________________________________      _______________________________________________________________________  Medication Rules  Purpose: To inform patients, and their family members, of our medication rules and regulations.  Applies to: All patients receiving prescriptions from our practice (written or electronic).  Pharmacy of record: This is the pharmacy where your electronic prescriptions   will be sent. Make sure we have the correct one.  Electronic prescriptions: In compliance with the [place] Strengthen Opioid Misuse Prevention (STOP) Act of 2017 (Session Law 2017-74/H243), effective February 02, 2018, all controlled substances must be electronically prescribed. Written prescriptions, faxing, or calling prescriptions to a pharmacy will no longer be done.  Prescription refills: These will be provided only during in-person appointments. No medications will be renewed without a "face-to-face" evaluation with your provider. Applies to all prescriptions.  NOTE: The following applies primarily to controlled substances (Opioid* Pain Medications).   Type of encounter (visit): For patients receiving controlled substances, face-to-face visits are required. (Not an option and not up to the patient.)  Patient's responsibilities: Pain Pills: Bring all pain pills to every appointment (except for procedure appointments). Pill Bottles: Bring pills in original pharmacy bottle. Bring bottle, even if empty. Always bring the bottle of the most recent fill.  Medication refills: You are responsible for knowing and keeping track of what medications you are taking and when is it that you will need a refill. The day before your appointment: write a list of all prescriptions that need to be refilled. The day of the appointment: give the list to the admitting nurse. Prescriptions will be written only during appointments. No prescriptions will be written on procedure days. If you forget a medication: it will not be "Called in", "Faxed", or  "electronically sent". You will need to get another appointment to get these prescribed. No early refills. Do not call asking to have your prescription filled early. Partial  or short prescriptions: Occasionally your pharmacy may not have enough pills to fill your prescription.  NEVER ACCEPT a partial fill or a prescription that is short of the total amount of pills that you were prescribed.  With controlled substances the law allows 72 hours for the pharmacy to complete the prescription.  If the prescription is not completed within 72 hours, the pharmacist will require a new prescription to be written. This means that you will be short on your medicine and we WILL NOT send another prescription to complete your original prescription.  Instead, request the pharmacy to send a carrier to a nearby branch to get enough medication to provide you with your full prescription. Prescription Accuracy: You are responsible for carefully inspecting your prescriptions before leaving our office. Have the discharge nurse carefully go over each prescription with you, before taking them home. Make sure that your name is accurately spelled, that your address is correct. Check the name and dose of your medication to make sure it is accurate. Check the number of pills, and the written instructions to make sure they are clear and accurate. Make sure that you are given enough medication to last until your next medication refill appointment. Taking Medication: Take medication as prescribed. When it comes to controlled substances, taking less pills or less frequently than prescribed is permitted and encouraged. Never take more pills than instructed. Never take the medication more frequently than prescribed.  Inform other Doctors: Always inform, all of your healthcare providers, of all the medications you take. Pain Medication from other Providers: You are not allowed to accept any additional pain medication from any other Doctor or  Healthcare provider. There are two exceptions to this rule. (see below) In the event that you require additional pain medication, you are responsible for notifying us, as stated below. Cough Medicine: Often these contain an opioid, such as codeine or hydrocodone. Never accept or take cough medicine containing   these opioids if you are already taking an opioid* medication. The combination may cause respiratory failure and death. Medication Agreement: You are responsible for carefully reading and following our Medication Agreement. This must be signed before receiving any prescriptions from our practice. Safely store a copy of your signed Agreement. Violations to the Agreement will result in no further prescriptions. (Additional copies of our Medication Agreement are available upon request.) Laws, Rules, & Regulations: All patients are expected to follow all Federal and State Laws, Statutes, Rules, & Regulations. Ignorance of the Laws does not constitute a valid excuse.  Illegal drugs and Controlled Substances: The use of illegal substances (including, but not limited to marijuana and its derivatives) and/or the illegal use of any controlled substances is strictly prohibited. Violation of this rule may result in the immediate and permanent discontinuation of any and all prescriptions being written by our practice. The use of any illegal substances is prohibited. Adopted CDC guidelines & recommendations: Target dosing levels will be at or below 60 MME/day. Use of benzodiazepines** is not recommended.  Exceptions: There are only two exceptions to the rule of not receiving pain medications from other Healthcare Providers. Exception #1 (Emergencies): In the event of an emergency (i.e.: accident requiring emergency care), you are allowed to receive additional pain medication. However, you are responsible for: As soon as you are able, call our office [phone], at any time of the day or night, and leave a  message stating your name, the date and nature of the emergency, and the name and dose of the medication prescribed. In the event that your call is answered by a member of our staff, make sure to document and save the date, time, and the name of the person that took your information.  Exception #2 (Planned Surgery): In the event that you are scheduled by another doctor or dentist to have any type of surgery or procedure, you are allowed (for a period no longer than 30 days), to receive additional pain medication, for the acute post-op pain. However, in this case, you are responsible for picking up a copy of our "Post-op Pain Management for Surgeons" handout, and giving it to your surgeon or dentist. This document is available at our office, and does not require an appointment to obtain it. Simply go to our office during business hours (Monday-Thursday from 8:00 AM to 4:00 PM) (Friday 8:00 AM to 12:00 Noon) or if you have a scheduled appointment with us, prior to your surgery, and ask for it by name. In addition, you are responsible for: calling our office [phone], at any time of the day or night, and leaving a message stating your name, name of your surgeon, type of surgery, and date of procedure or surgery. Failure to comply with your responsibilities may result in termination of therapy involving the controlled substances. Medication Agreement Violation. Following the above rules, including your responsibilities will help you in avoiding a Medication Agreement Violation ("Breaking your Pain Medication Contract").  Consequences:  Not following the above rules may result in permanent discontinuation of medication prescription therapy.  *Opioid medications include: morphine, codeine, oxycodone, oxymorphone, hydrocodone, hydromorphone, meperidine, tramadol, tapentadol, buprenorphine, fentanyl, methadone. **Benzodiazepine medications include: diazepam (Valium), alprazolam (Xanax), clonazepam (Klonopine),  lorazepam (Ativan), clorazepate (Tranxene), chlordiazepoxide (Librium), estazolam (Prosom), oxazepam (Serax), temazepam (Restoril), triazolam (Halcion) (Last updated: 11/25/2021) ______________________________________________________________________    ______________________________________________________________________  Medication Recommendations and Reminders  Applies to: All patients receiving prescriptions (written and/or electronic).  Medication Rules & Regulations: You are responsible   for reading, knowing, and following our "Medication Rules" document. These exist for your safety and that of others. They are not flexible and neither are we. Dismissing or ignoring them is an act of "non-compliance" that may result in complete and irreversible termination of such medication therapy. For safety reasons, "non-compliance" will not be tolerated. As with the [place] fundamental legal principle of "ignorance of the law is no defense", we will accept no excuses for not having read and knowing the content of documents provided to you by our practice.  Pharmacy of record:  Definition: This is the pharmacy where your electronic prescriptions will be sent.  We do not endorse any particular pharmacy. It is up to you and your insurance to decide what pharmacy to use.  We do not restrict you in your choice of pharmacy. However, once we write for your prescriptions, we will NOT be re-sending more prescriptions to fix restricted supply problems created by your pharmacy, or your insurance.  The pharmacy listed in the electronic medical record should be the one where you want electronic prescriptions to be sent. If you choose to change pharmacy, simply notify our nursing staff. Changes will be made only during your regular appointments and not over the phone.  Recommendations: Keep all of your pain medications in a safe place, under lock and key, even if you live alone. We will NOT replace lost, stolen, or  damaged medication. We do not accept "Police Reports" as proof of medications having been stolen. After you fill your prescription, take 1 week's worth of pills and put them away in a safe place. You should keep a separate, properly labeled bottle for this purpose. The remainder should be kept in the original bottle. Use this as your primary supply, until it runs out. Once it's gone, then you know that you have 1 week's worth of medicine, and it is time to come in for a prescription refill. If you do this correctly, it is unlikely that you will ever run out of medicine. To make sure that the above recommendation works, it is very important that you make sure your medication refill appointments are scheduled at least 1 week before you run out of medicine. To do this in an effective manner, make sure that you do not leave the office without scheduling your next medication management appointment. Always ask the nursing staff to show you in your prescription , when your medication will be running out. Then arrange for the receptionist to get you a return appointment, at least 7 days before you run out of medicine. Do not wait until you have 1 or 2 pills left, to come in. This is very poor planning and does not take into consideration that we may need to cancel appointments due to bad weather, sickness, or emergencies affecting our staff. DO NOT ACCEPT A "Partial Fill": If for any reason your pharmacy does not have enough pills/tablets to completely fill or refill your prescription, do not allow for a "partial fill". The law allows the pharmacy to complete that prescription within 72 hours, without requiring a new prescription. If they do not fill the rest of your prescription within those 72 hours, you will need a separate prescription to fill the remaining amount, which we will NOT provide. If the reason for the partial fill is your insurance, you will need to talk to the pharmacist about payment alternatives for  the remaining tablets, but again, DO NOT ACCEPT A PARTIAL FILL, unless you can trust   your pharmacist to obtain the remainder of the pills within 72 hours.  Prescription refills and/or changes in medication(s):  Prescription refills, and/or changes in dose or medication, will be conducted only during scheduled medication management appointments. (Applies to both, written and electronic prescriptions.) No refills on procedure days. No medication will be changed or started on procedure days. No changes, adjustments, and/or refills will be conducted on a procedure day. Doing so will interfere with the diagnostic portion of the procedure. No phone refills. No medications will be "called into the pharmacy". No Fax refills. No weekend refills. No Holliday refills. No after hours refills.  Remember:  Business hours are:  Monday to Thursday 8:00 AM to 4:00 PM Provider's Schedule: Reve Crocket, MD - Appointments are:  Medication management: Monday and Wednesday 8:00 AM to 4:00 PM Procedure day: Tuesday and Thursday 7:30 AM to 4:00 PM Bilal Lateef, MD - Appointments are:  Medication management: Tuesday and Thursday 8:00 AM to 4:00 PM Procedure day: Monday and Wednesday 7:30 AM to 4:00 PM (Last update: 11/25/2021) ______________________________________________________________________    ____________________________________________________________________________________________  Pharmacy Shortages of Pain Medication   Introduction Shockingly as it may seem, .  "No U.S. Supreme Court decision has ever interpreted the Constitution as guaranteeing a right to health care for all Americans." - https://www.healthequityandpolicylab.com/elusive-right-to-health-care-under-us-law  "With respect to human rights, the [place] has no formally codified right to health, nor does it participate in a human rights treaty that specifies a right to health." - Scott J. Schweikart, JD,  MBE  Situation By now, most of our patients have had the experience of being told by their pharmacist that they do not have enough medication to cover their prescription. If you have not had this experience, just know that you soon will.  Problem There appears to be a shortage of these medications, either at the national level or locally. This is happening with all pharmacies. When there is not enough medication, patients are offered a partial fill and they are told that they will try to get the rest of the medicine for them at a later time. If they do not have enough for even a partial fill, the pharmacists are telling the patients to call us (the prescribing physicians) to request that we send another prescription to another pharmacy to get the medicine.   This reordering of a controlled substance creates documentation problems where additional paperwork needs to be created to explain why two prescriptions for the same period of time and the same medicine are being prescribed to the same patient. It also creates situations where the last appointment note does not accurately reflect when and what prescriptions were given to a patient. This leads to prescribing errors down the line, in subsequent follow-up visits.   [place] Board of Pharmacy (NCBOP) Research revealed that Board of Pharmacy Rule .1806 (21 NCAC 46.1806) authorizes pharmacists to the transfer of prescriptions among pharmacies, and it sets forth procedural and recordkeeping requirements for doing so. However, this requires the pharmacist to complete the previously mentioned procedural paperwork to accomplish the transfer. As it turns out, it is much easier for them to have the prescribing physicians do the work.   Possible solutions 1. You can ask your physician to assist you in weaning yourself off these medications. 2. Ask your pharmacy if the medication is in stock, 3 days prior to your refill. 3. If you need a pharmacy change,  let us know at your medication management visit. Prescriptions that have already been   electronically sent to a pharmacy will not be re-sent to a different pharmacy if your pharmacy of record does not have it in stock. Proper stocking of medication is a pharmacy problem, not a prescriber problem. Work with your pharmacist to solve the problem. 4. Have the [place] Assembly add a provision to the "STOP ACT" (the law that mandates how controlled substances are prescribed) where there is an exception to the electronic prescribing rule that states that in the event there are shortages of medications the physicians are allowed to use written prescriptions as opposed to electronic ones. This would allow patients to take their prescriptions to a different pharmacy that may have enough medication available to fill the prescription. The problem is that currently there is a law that does not allow for written prescriptions, with the exception of instances where the electronic medical record is down due to technical issues.  5. Have [place] Congress ease the pressure on pharmaceutical companies, allowing them to produce enough quantities of the medication to adequately supply the population. 6. Have pharmacies keep enough stocks of these medications to cover their client base.  7. Have the [place] State Assembly add a provision to the "STOP ACT" where they ease the regulations surrounding the transfer of controlled substances between pharmacies, so as to simplify the transfer of supplies. As an alternative, develop a system to allow patients to obtain the remainder of their prescription at another one of their pharmacies or at an associate pharmacy.   How this shortage will affect you.  Understand that this is a pharmacy supply problem, not a prescriber problem. Work with your pharmacy to solve it. The job of the prescriber is to evaluate and monitor the patient for the appropriate indications and use of  these medicines. It is not the job of the prescriber to supply the medication or to solve problems with that supply. The responsibility and the choice to obtain the medication resides on the patient. By law, supplying the medication is the job of the pharmacy. It is certainly not the job of the prescriber to solve supply problems.   Due to the above problems we are no longer taking patients to write for their pain medication. Future discussions with your physician may include potentially weaning medications or transitioning to alternatives.  We will be focusing primarily on interventional based pain management. We will continue to evaluate for appropriate indications and we may provide recommendations regarding medication, dose, and schedule, as well as monitoring recommendations, however, we will not be taking over the actual prescribing of these substances. On those patients where we are treating their chronic pain with interventional therapies, exceptions will be considered on a case by case basis. At this time, we will try to continue providing this supplemental service to those patients we have been managing in the past. However, as of August 1st, 2023, we no longer will be sending additional prescriptions to other pharmacies for the purpose of solving their supply problems. Once we send a prescription to a pharmacy, we will not be resending it again to another pharmacy to cover for their shortages.   What to do. Write as many letters as you can. Recruit the help of family members in writing these letters. Below are some of the places where you can write to make your voice heard. Let them know what the problem is and push them to look for solutions.   Search internet for: "[place] find your legislators" https://www.ncleg.gov/findyourlegislators  Search   internet for: "[place] insurance commissioner  complaints" https://www.ncdoi.gov/contactscomplaints/assistance-or-file-complaint  Search internet for: " Board of Pharmacy complaints" http://www.ncbop.org/contact.htm  Search internet for: "[facility] pharmacy complaints" Email [facility] Customer Relations https://www.[facility].com/help/email-[email or web address].jsp?callType=store  Search internet for: "[facility] pharmacy customer service complaints" https://www.[facility].com/topic/marketing/contactus/contactus_customerservice.jsp  ____________________________________________________________________________________________     ____________________________________________________________________________________________  Drug Holidays (Slow)  What is a "Drug Holiday"? Drug Holiday: is the name given to the period of time during which a patient stops taking a medication(s) for the purpose of eliminating tolerance to the drug.  Benefits Improved effectiveness of opioid medication. Decreased opioid dose needed to achieve benefits. Improved pain with lesser dose. Ending dependence on high dose therapy. Possible decrease in cost of therapy. It may uncover "opioid-induced hyperalgesia". (OIH)  What is "opioid hyperalgesia"? It is an increased paradoxical pain sensitization state caused by exposure to opioids, whereby a patient receiving opioids for the treatment of pain could actually become more sensitive to a painful stimuli. Stopping the opioid pain medication, contrary to the expected increase in the pain, it actually decreases or completely eliminates the pain. Ref.: "A comprehensive review of opioid-induced hyperalgesia". Marion Lee, et.al. Pain Physician. 2011 Mar-Apr;14(2):145-61.  What is tolerance? Tolerance: is the progressive decreased in effectiveness of a drug due to its repetitive use. With repetitive use, the body gets use to the medication and as a consequence, it loses its effectiveness. This is a common problem  seen with opioid pain medications. As a result, a larger dose of the drug is needed to achieve the same effect that used to be obtained with a smaller dose.  How long should a "Drug Holiday" last? Effectiveness depends on the patient staying off all opioid pain medicines for a minimum of 14 consecutive days. (2 weeks)  How about just taking less of the medicine? Does not work. This will not eliminate the excess receptors.  How about switching to a different pain medicine? (AKA. "Opioid rotation") This "technique" was promoted by studies funded by pharmaceutical companies, such as [employer], creators of "OxyContin". It does not work. It only creates the illusion of effectiveness by taking advantage of inaccurate equivalent dose calculations between different opioid medications.   Should I stop the medicine "cold turkey"? No. You should always coordinate with your Pain Specialist so that he/she can provide you with the correct medication dose to make the transition as smoothly as possible.  How do I stop the medicine? Slowly. You will be instructed to decrease the daily amount of pills that you take by one (1) pill every seven (7) days. This is called a "slow downward taper" of your dose. For example: if you normally take four (4) pills per day, you will be asked to drop this dose to three (3) pills per day for seven (7) days, then to two (2) pills per day for seven (7) days, then to one (1) per day for seven (7) days, and at the end of those last seven (7) days, this is when the "Drug Holiday" would start.   How about withdrawals? Typically, what triggers withdrawals is the sudden stop of a high dose opioid medication. Withdrawals can usually be avoided by slowly decreasing the dose over a prolonged period of time. If you attempt to stop your medication abruptly, withdrawals may be possible.  What are withdrawals? Withdrawals: refers to the wide range of symptoms that occur after stopping or  dramatically reducing opiate drugs after heavy and prolonged use. Withdrawal symptoms do not occur to patients that use low dose opioids,   or those who take the medication sporadically. Contrary to benzodiazepine (example: Valium, Xanax, etc.) or alcohol withdrawals ("Delirium Tremens"), opioid withdrawals are not lethal. Withdrawals are the physical manifestation of the body getting rid of the excess receptors.  Withdrawal Symptoms Early symptoms of withdrawal may include: Agitation Anxiety Muscle aches Increased tearing Insomnia Runny nose Sweating Yawning  Late symptoms of withdrawal may include: Abdominal cramping Diarrhea Dilated pupils Goose bumps Nausea Vomiting  Will I experience withdrawals? Due to the slow nature of the taper, it is very unlikely.  What is a slow taper? Taper: refers to the gradual decrease in dose.  (Last update: 12/29/2021) ____________________________________________________________________________________________    ____________________________________________________________________________________________  CBD (cannabidiol) & Delta-8 (Delta-8 tetrahydrocannabinol) WARNING  Intro: Cannabidiol (CBD) and tetrahydrocannabinol (THC), are two natural compounds found in plants of the Cannabis genus. They can both be extracted from hemp or cannabis. Hemp and cannabis come from the Cannabis sativa plant. Both compounds interact with your body's endocannabinoid system, but they have very different effects. CBD does not produce the high sensation associated with cannabis. Delta-8 tetrahydrocannabinol, also known as delta-8 THC, is a psychoactive substance found in the Cannabis sativa plant, of which marijuana and hemp are two varieties. THC is responsible for the high associated with the illicit use of marijuana.  Applicable to: All individuals currently taking or considering taking CBD (cannabidiol) and, more important, all patients taking opioid analgesic  controlled substances (pain medication). (Example: oxycodone; oxymorphone; hydrocodone; hydromorphone; morphine; methadone; tramadol; tapentadol; fentanyl; buprenorphine; butorphanol; dextromethorphan; meperidine; codeine; etc.)  Legal status: CBD remains a Schedule I drug prohibited for any use. CBD is illegal with one exception. In the [place], CBD has a limited Food and Drug Administration (FDA) approval for the treatment of two specific types of epilepsy disorders. Only one CBD product has been approved by the FDA for this purpose: "Epidiolex". FDA is aware that some companies are marketing products containing cannabis and cannabis-derived compounds in ways that violate the Federal Food, Drug and Cosmetic Act (FD&C Act) and that may put the health and safety of consumers at risk. The FDA, a Federal agency, has not enforced the CBD status since 2018. UPDATE: (03/21/2021) The Drug Enforcement Agency (DEA) issued a letter stating that "delta" cannabinoids, including Delta-8-THCO and Delta-9-THCO, synthetically derived from hemp do not qualify as hemp and will be viewed as Schedule I drugs. (Schedule I drugs, substances, or chemicals are defined as drugs with no currently accepted medical use and a high potential for abuse. Some examples of Schedule I drugs are: heroin, lysergic acid diethylamide (LSD), marijuana (cannabis), 3,4-methylenedioxymethamphetamine (ecstasy), methaqualone, and peyote.) (https://www.dea.gov)  Legality: Some manufacturers ship CBD products nationally, which is illegal. Often such products are sold online and are therefore available throughout the country. CBD is openly sold in head shops and health food stores in some states where such sales have not been explicitly legalized. Selling unapproved products with unsubstantiated therapeutic claims is not only a violation of the law, but also can put patients at risk, as these products have not been proven to be safe or effective.  Federal illegality makes it difficult to conduct research on CBD.  Reference: "FDA Regulation of Cannabis and Cannabis-Derived Products, Including Cannabidiol (CBD)" - https://www.fda.gov/news-events/public-health-focus/fda-regulation-cannabis-and-cannabis-derived-products-including-cannabidiol-cbd  Warning: CBD is not FDA approved and has not undergo the same manufacturing controls as prescription drugs.  This means that the purity and safety of available CBD may be questionable. Most of the time, despite manufacturer's claims, it is contaminated with THC (delta-9-tetrahydrocannabinol - the chemical   in marijuana responsible for the "HIGH").  When this is the case, the THC contaminant will trigger a positive urine drug screen (UDS) test for Marijuana (carboxy-THC). Because a positive UDS for any illicit substance is a violation of our medication agreement, your opioid analgesics (pain medicine) may be permanently discontinued. The FDA recently put out a warning about 5 things that everyone should be aware of regarding Delta-8 THC: Delta-8 THC products have not been evaluated or approved by the FDA for safe use and may be marketed in ways that put the public health at risk. The FDA has received adverse event reports involving delta-8 THC-containing products. Delta-8 THC has psychoactive and intoxicating effects. Delta-8 THC manufacturing often involve use of potentially harmful chemicals to create the concentrations of delta-8 THC claimed in the marketplace. The final delta-8 THC product may have potentially harmful by-products (contaminants) due to the chemicals used in the process. Manufacturing of delta-8 THC products may occur in uncontrolled or unsanitary settings, which may lead to the presence of unsafe contaminants or other potentially harmful substances. Delta-8 THC products should be kept out of the reach of children and pets.  MORE ABOUT CBD  General Information: CBD was discovered in 1940  and it is a derivative of the cannabis sativa genus plants (Marijuana and Hemp). It is one of the 113 identified substances found in Marijuana. It accounts for up to 40% of the plant's extract. As of 2018, preliminary clinical studies on CBD included research for the treatment of anxiety, movement disorders, and pain. CBD is available and consumed in multiple forms, including inhalation of smoke or vapor, as an aerosol spray, and by mouth. It may be supplied as an oil containing CBD, capsules, dried cannabis, or as a liquid solution. CBD is thought not to be as psychoactive as THC (delta-9-tetrahydrocannabinol - the chemical in marijuana responsible for the "HIGH"). Studies suggest that CBD may interact with different biological target receptors in the body, including cannabinoid and other neurotransmitter receptors. As of 2018 the mechanism of action for its biological effects has not been determined.  Side-effects  Adverse reactions: Dry mouth, diarrhea, decreased appetite, fatigue, drowsiness, malaise, weakness, sleep disturbances, and others.  Drug interactions: CBC may interact with other medications such as blood-thinners. Because CBD causes drowsiness on its own, it also increases the drowsiness caused by other medications, including antihistamines (such as Benadryl), benzodiazepines (Xanax, Ativan, Valium), antipsychotics, antidepressants and opioids, as well as alcohol and supplements such as kava, melatonin and St. John's Wort. Be cautious with the following combinations:   Brivaracetam (Briviact) Brivaracetam is changed and broken down by the body. CBD might decrease how quickly the body breaks down brivaracetam. This might increase levels of brivaracetam in the body.  Caffeine Caffeine is changed and broken down by the body. CBD might decrease how quickly the body breaks down caffeine. This might increase levels of caffeine in the body.  Carbamazepine (Tegretol) Carbamazepine is changed and  broken down by the body. CBD might decrease how quickly the body breaks down carbamazepine. This might increase levels of carbamazepine in the body and increase its side effects.  Citalopram (Celexa) Citalopram is changed and broken down by the body. CBD might decrease how quickly the body breaks down citalopram. This might increase levels of citalopram in the body and increase its side effects.  Clobazam (Onfi) Clobazam is changed and broken down by the liver. CBD might decrease how quickly the liver breaks down clobazam. This might increase the effects and side effects   of clobazam.  Eslicarbazepine (Aptiom) Eslicarbazepine is changed and broken down by the body. CBD might decrease how quickly the body breaks down eslicarbazepine. This might increase levels of eslicarbazepine in the body by a small amount.  Everolimus (Zostress) Everolimus is changed and broken down by the body. CBD might decrease how quickly the body breaks down everolimus. This might increase levels of everolimus in the body.  Lithium Taking higher doses of CBD might increase levels of lithium. This can increase the risk of lithium toxicity.  Medications changed by the liver (Cytochrome P450 1A1 (CYP1A1) substrates) Some medications are changed and broken down by the liver. CBD might change how quickly the liver breaks down these medications. This could change the effects and side effects of these medications.  Medications changed by the liver (Cytochrome P450 1A2 (CYP1A2) substrates) Some medications are changed and broken down by the liver. CBD might change how quickly the liver breaks down these medications. This could change the effects and side effects of these medications.  Medications changed by the liver (Cytochrome P450 1B1 (CYP1B1) substrates) Some medications are changed and broken down by the liver. CBD might change how quickly the liver breaks down these medications. This could change the effects and side  effects of these medications.  Medications changed by the liver (Cytochrome P450 2A6 (CYP2A6) substrates) Some medications are changed and broken down by the liver. CBD might change how quickly the liver breaks down these medications. This could change the effects and side effects of these medications.  Medications changed by the liver (Cytochrome P450 2B6 (CYP2B6) substrates) Some medications are changed and broken down by the liver. CBD might change how quickly the liver breaks down these medications. This could change the effects and side effects of these medications.  Medications changed by the liver (Cytochrome P450 2C19 (CYP2C19) substrates) Some medications are changed and broken down by the liver. CBD might change how quickly the liver breaks down these medications. This could change the effects and side effects of these medications.  Medications changed by the liver (Cytochrome P450 2C8 (CYP2C8) substrates) Some medications are changed and broken down by the liver. CBD might change how quickly the liver breaks down these medications. This could change the effects and side effects of these medications.  Medications changed by the liver (Cytochrome P450 2C9 (CYP2C9) substrates) Some medications are changed and broken down by the liver. CBD might change how quickly the liver breaks down these medications. This could change the effects and side effects of these medications.  Medications changed by the liver (Cytochrome P450 2D6 (CYP2D6) substrates) Some medications are changed and broken down by the liver. CBD might change how quickly the liver breaks down these medications. This could change the effects and side effects of these medications.  Medications changed by the liver (Cytochrome P450 2E1 (CYP2E1) substrates) Some medications are changed and broken down by the liver. CBD might change how quickly the liver breaks down these medications. This could change the effects and side effects  of these medications.  Medications changed by the liver (Cytochrome P450 3A4 (CYP3A4) substrates) Some medications are changed and broken down by the liver. CBD might change how quickly the liver breaks down these medications. This could change the effects and side effects of these medications.  Medications changed by the liver (Glucuronidated drugs) Some medications are changed and broken down by the liver. CBD might change how quickly the liver breaks down these medications. This could change the effects and side effects   of these medications.  Medications that decrease the breakdown of other medications by the liver (Cytochrome P450 2C19 (CYP2C19) inhibitors) CBD is changed and broken down by the liver. Some drugs decrease how quickly the liver changes and breaks down CBD. This could change the effects and side effects of CBD.  Medications that decrease the breakdown of other medications in the liver (Cytochrome P450 3A4 (CYP3A4) inhibitors) CBD is changed and broken down by the liver. Some drugs decrease how quickly the liver changes and breaks down CBD. This could change the effects and side effects of CBD.  Medications that increase breakdown of other medications by the liver (Cytochrome P450 3A4 (CYP3A4) inducers) CBD is changed and broken down by the liver. Some drugs increase how quickly the liver changes and breaks down CBD. This could change the effects and side effects of CBD.  Medications that increase the breakdown of other medications by the liver (Cytochrome P450 2C19 (CYP2C19) inducers) CBD is changed and broken down by the liver. Some drugs increase how quickly the liver changes and breaks down CBD. This could change the effects and side effects of CBD.  Methadone (Dolophine) Methadone is broken down by the liver. CBD might decrease how quickly the liver breaks down methadone. Taking cannabidiol along with methadone might increase the effects and side effects of  methadone.  Rufinamide (Banzel) Rufinamide is changed and broken down by the body. CBD might decrease how quickly the body breaks down rufinamide. This might increase levels of rufinamide in the body by a small amount.  Sedative medications (CNS depressants) CBD might cause sleepiness and slowed breathing. Some medications, called sedatives, can also cause sleepiness and slowed breathing. Taking CBD with sedative medications might cause breathing problems and/or too much sleepiness.  Sirolimus (Rapamune) Sirolimus is changed and broken down by the body. CBD might decrease how quickly the body breaks down sirolimus. This might increase levels of sirolimus in the body.  Stiripentol (Diacomit) Stiripentol is changed and broken down by the body. CBD might decrease how quickly the body breaks down stiripentol. This might increase levels of stiripentol in the body and increase its side effects.  Tacrolimus (Prograf) Tacrolimus is changed and broken down by the body. CBD might decrease how quickly the body breaks down tacrolimus. This might increase levels of tacrolimus in the body.  Tamoxifen (Soltamox) Tamoxifen is changed and broken down by the body. CBD might affect how quickly the body breaks down tamoxifen. This might affect levels of tamoxifen in the body.  Topiramate (Topamax) Topiramate is changed and broken down by the body. CBD might decrease how quickly the body breaks down topiramate. This might increase levels of topiramate in the body by a small amount.  Valproate Valproic acid can cause liver injury. Taking cannabidiol with valproic acid might increase the chance of liver injury. CBD and/or valproic acid might need to be stopped, or the dose might need to be reduced.  Warfarin (Coumadin) CBD might increase levels of warfarin, which can increase the risk for bleeding. CBD and/or warfarin might need to be stopped, or the dose might need to be reduced.  Zonisamide Zonisamide is  changed and broken down by the body. CBD might decrease how quickly the body breaks down zonisamide. This might increase levels of zonisamide in the body by a small amount. (Last update: 04/02/2021) ____________________________________________________________________________________________   ____________________________________________________________________________________________  Naloxone Nasal Spray  Why am I receiving this medication? [place] STOP ACT requires that all patients taking high dose opioids or at   risk of opioids respiratory depression, be prescribed an opioid reversal agent, such as Naloxone (AKA: Narcan).  What is this medication? NALOXONE (nal OX one) treats opioid overdose, which causes slow or shallow breathing, severe drowsiness, or trouble staying awake. Call emergency services after using this medication. You may need additional treatment. Naloxone works by reversing the effects of opioids. It belongs to a group of medications called opioid blockers.  COMMON BRAND NAME(S): Kloxxado, Narcan  What should I tell my care team before I take this medication? They need to know if you have any of these conditions: Heart disease Substance use disorder An unusual or allergic reaction to naloxone, other medications, foods, dyes, or preservatives Pregnant or trying to get pregnant Breast-feeding  When to use this medication? This medication is to be used for the treatment of respiratory depression (less than 8 breaths per minute) secondary to opioid overdose.   How to use this medication? This medication is for use in the nose. Lay the person on their back. Support their neck with your hand and allow the head to tilt back before giving the medication. The nasal spray should be given into 1 nostril. After giving the medication, move the person onto their side. Do not remove or test the nasal spray until ready to use. Get emergency medical help right away after giving  the first dose of this medication, even if the person wakes up. You should be familiar with how to recognize the signs and symptoms of a narcotic overdose. If more doses are needed, give the additional dose in the other nostril. Talk to your care team about the use of this medication in children. While this medication may be prescribed for children as young as newborns for selected conditions, precautions do apply.  Naloxone Overdosage: If you think you have taken too much of this medicine contact a poison control center or emergency room at once.  NOTE: This medicine is only for you. Do not share this medicine with others.  What if I miss a dose? This does not apply.  What may interact with this medication? This is only used during an emergency. No interactions are expected during emergency use. This list may not describe all possible interactions. Give your health care provider a list of all the medicines, herbs, non-prescription drugs, or dietary supplements you use. Also tell them if you smoke, drink alcohol, or use illegal drugs. Some items may interact with your medicine.  What should I watch for while using this medication? Keep this medication ready for use in the case of an opioid overdose. Make sure that you have the phone number of your care team and local hospital ready. You may need to have additional doses of this medication. Each nasal spray contains a single dose. Some emergencies may require additional doses. After use, bring the treated person to the nearest hospital or call 911. Make sure the treating care team knows that the person has received a dose of this medication. You will receive additional instructions on what to do during and after use of this medication before an emergency occurs.  What side effects may I notice from receiving this medication? Side effects that you should report to your care team as soon as possible: Allergic reactions--skin rash, itching, hives,  swelling of the face, lips, tongue, or throat Side effects that usually do not require medical attention (report these to your care team if they continue or are bothersome): Constipation Dryness or irritation inside the nose Headache   Increase in blood pressure Muscle spasms Stuffy nose Toothache This list may not describe all possible side effects. Call your doctor for medical advice about side effects. You may report side effects to FDA at 1-800-FDA-1088.  Where should I keep my medication? Because this is an emergency medication, you should keep it with you at all times.  Keep out of the reach of children and pets. Store between 20 and 25 degrees C (68 and 77 degrees F). Do not freeze. Throw away any unused medication after the expiration date. Keep in original box until ready to use.  NOTE: This sheet is a summary. It may not cover all possible information. If you have questions about this medicine, talk to your doctor, pharmacist, or health care provider.   2023 Elsevier/Gold Standard (2020-09-27 00:00:00)  ____________________________________________________________________________________________   

## 2022-01-02 ENCOUNTER — Telehealth: Payer: Self-pay | Admitting: Pain Medicine

## 2022-01-02 NOTE — Telephone Encounter (Signed)
Will discuss with Dr Dossie Arbour on MOnday when he returns.

## 2022-01-02 NOTE — Telephone Encounter (Signed)
Patient lvmail asking if he can get the shot he and Dr Dossie Arbour talked about at his appt. Also would like a phone call.

## 2022-01-05 ENCOUNTER — Telehealth: Payer: Self-pay | Admitting: Family Medicine

## 2022-01-05 ENCOUNTER — Telehealth: Payer: Self-pay

## 2022-01-05 NOTE — Telephone Encounter (Signed)
Pt need a refill on mometasone sent to [facility]

## 2022-01-05 NOTE — Telephone Encounter (Signed)
Spoke with patient and he denies that he needs an injection at this time.

## 2022-01-06 ENCOUNTER — Other Ambulatory Visit: Payer: Self-pay | Admitting: Family Medicine

## 2022-01-06 MED ORDER — MOMETASONE FUROATE 50 MCG/ACT NA SUSP: NASAL | 3 refills | Status: DC

## 2022-01-06 NOTE — Telephone Encounter (Signed)
Prescription sent in  

## 2022-01-07 NOTE — Telephone Encounter (Signed)
Patient states his pharmacy has not received the prescription for mometasone (NASONEX) 50 MCG/ACT nasal spray, so he is following up with [place].  I let patient know that we received confirmation from [facility] that they received it yesterday.  Patient states he wanted [facility] [address] (not in [facility]).  Patient states he has been out of this medication for a while.

## 2022-01-08 ENCOUNTER — Telehealth: Payer: Self-pay

## 2022-01-08 NOTE — Telephone Encounter (Signed)
Noted  

## 2022-01-08 NOTE — Telephone Encounter (Signed)
House call visit summary has been placed In provider to be reviewed folder.

## 2022-01-09 ENCOUNTER — Other Ambulatory Visit: Payer: Self-pay | Admitting: *Deleted

## 2022-01-09 MED ORDER — MOMETASONE FUROATE 50 MCG/ACT NA SUSP: NASAL | 3 refills | Status: DC

## 2022-01-09 NOTE — Telephone Encounter (Signed)
Patient called about his refill for Nasonex. He stated that his pharmacy has not received any prescribtion.

## 2022-01-09 NOTE — Telephone Encounter (Signed)
Pt notified that Rx has been sent to correct pharmacy

## 2022-01-13 ENCOUNTER — Ambulatory Visit: Payer: Medicare Other | Admitting: Family Medicine

## 2022-01-13 ENCOUNTER — Other Ambulatory Visit: Payer: Self-pay | Admitting: Family

## 2022-01-14 NOTE — Telephone Encounter (Signed)
Pt need refill on ALPRAZolam sent to [facility]

## 2022-01-14 NOTE — Telephone Encounter (Signed)
Medication sent to the pharmacy by Dorothy Puffer.  Loi Rennaker,cma

## 2022-01-15 NOTE — Telephone Encounter (Signed)
Pt stated the pharmacy does not have the medication [facility] [address]

## 2022-01-15 NOTE — Telephone Encounter (Signed)
Patient stating he is completely out of alprazolam last filled 11/26/2021 for 6 tablets patient completely out could not keep last appt due to provider out of office on 01/13/22 patien thas been rescheduled for next available.

## 2022-01-21 ENCOUNTER — Telehealth: Payer: Self-pay | Admitting: Family Medicine

## 2022-01-21 NOTE — Telephone Encounter (Signed)
Copied from [place] [id number]. Topic: Medicare AWV >> Jan 21, 2022 11:42 AM Devoria Glassing wrote: Reason for CRM: Left message for patient to schedule Annual Wellness Visit.  Please schedule with Nurse Health Advisor Madelyn Brunner, LPN at [facility]. This appt can be telephone or office visit.  Please call [phone] ask for [facility]

## 2022-01-24 DIAGNOSIS — E1165 Type 2 diabetes mellitus with hyperglycemia: Secondary | ICD-10-CM | POA: Diagnosis not present

## 2022-01-29 ENCOUNTER — Other Ambulatory Visit: Payer: Self-pay | Admitting: Family Medicine

## 2022-01-29 DIAGNOSIS — Z794 Long term (current) use of insulin: Secondary | ICD-10-CM

## 2022-02-03 ENCOUNTER — Other Ambulatory Visit: Payer: Self-pay

## 2022-02-03 DIAGNOSIS — I10 Essential (primary) hypertension: Secondary | ICD-10-CM

## 2022-02-03 MED ORDER — CARVEDILOL 25 MG PO TABS: 25.0000 mg | ORAL_TABLET | Freq: Two times a day (BID) | ORAL | 3 refills | Status: DC

## 2022-02-06 ENCOUNTER — Ambulatory Visit (INDEPENDENT_AMBULATORY_CARE_PROVIDER_SITE_OTHER): Payer: Medicare Other

## 2022-02-06 VITALS — Ht 73.0 in | Wt 250.0 lb

## 2022-02-06 DIAGNOSIS — Z0000 Encounter for general adult medical examination without abnormal findings: Secondary | ICD-10-CM

## 2022-02-06 NOTE — Patient Instructions (Addendum)
Alan Schmidt , Thank you for taking time to come for your Medicare Wellness Visit. I appreciate your ongoing commitment to your health goals. Please review the following plan we discussed and let me know if I can assist you in the future.   These are the goals we discussed:  Goals      Maintain healthy lifestyle     Healthy diet Stay active Stay hydrated        This is a list of the screening recommended for you and due dates:  Health Maintenance  Topic Date Due   Yearly kidney health urinalysis for diabetes  Never done   Hepatitis C Screening: USPSTF Recommendation to screen - Ages 29-79 yo.  Never done   Colon Cancer Screening  Never done   Stool Blood Test  08/29/2021   COVID-19 Vaccine (7 - 2023-24 season) 02/22/2022*   Hemoglobin A1C  03/05/2022   Yearly kidney function blood test for diabetes  04/26/2022   Complete foot exam   11/17/2022   Eye exam for diabetics  12/02/2022   Medicare Annual Wellness Visit  02/07/2023   DTaP/Tdap/Td vaccine (2 - Td or Tdap) 04/08/2025   Pneumonia Vaccine  Completed   Flu Shot  Completed   Zoster (Shingles) Vaccine  Completed   HPV Vaccine  Aged Out  *Topic was postponed. The date shown is not the original due date.   Next appointment: Follow up in one year for your annual wellness visit.   Preventive Care 21 Years and Older, Male  Preventive care refers to lifestyle choices and visits with your health care provider that can promote health and wellness. What does preventive care include? A yearly physical exam. This is also called an annual well check. Dental exams once or twice a year. Routine eye exams. Ask your health care provider how often you should have your eyes checked. Personal lifestyle choices, including: Daily care of your teeth and gums. Regular physical activity. Eating a healthy diet. Avoiding tobacco and drug use. Limiting alcohol use. Practicing safe sex. Taking low doses of aspirin every day. Taking vitamin  and mineral supplements as recommended by your health care provider. What happens during an annual well check? The services and screenings done by your health care provider during your annual well check will depend on your age, overall health, lifestyle risk factors, and family history of disease. Counseling  Your health care provider may ask you questions about your: Alcohol use. Tobacco use. Drug use. Emotional well-being. Home and relationship well-being. Sexual activity. Eating habits. History of falls. Memory and ability to understand (cognition). Work and work Statistician. Screening  You may have the following tests or measurements: Height, weight, and BMI. Blood pressure. Lipid and cholesterol levels. These may be checked every 5 years, or more frequently if you are over 45 years old. Skin check. Lung cancer screening. You may have this screening every year starting at age 32 if you have a 30-pack-year history of smoking and currently smoke or have quit within the past 15 years. Fecal occult blood test (FOBT) of the stool. You may have this test every year starting at age 4. Flexible sigmoidoscopy or colonoscopy. You may have a sigmoidoscopy every 5 years or a colonoscopy every 10 years starting at age 53. Prostate cancer screening. Recommendations will vary depending on your family history and other risks. Hepatitis C blood test. Hepatitis B blood test. Sexually transmitted disease (STD) testing. Diabetes screening. This is done by checking your blood sugar (glucose)  after you have not eaten for a while (fasting). You may have this done every 1-3 years. Abdominal aortic aneurysm (AAA) screening. You may need this if you are a current or former smoker. Osteoporosis. You may be screened starting at age 82 if you are at high risk. Talk with your health care provider about your test results, treatment options, and if necessary, the need for more tests. Vaccines  Your health care  provider may recommend certain vaccines, such as: Influenza vaccine. This is recommended every year. Tetanus, diphtheria, and acellular pertussis (Tdap, Td) vaccine. You may need a Td booster every 10 years. Zoster vaccine. You may need this after age 63. Pneumococcal 13-valent conjugate (PCV13) vaccine. One dose is recommended after age 51. Pneumococcal polysaccharide (PPSV23) vaccine. One dose is recommended after age 32. Talk to your health care provider about which screenings and vaccines you need and how often you need them. This information is not intended to replace advice given to you by your health care provider. Make sure you discuss any questions you have with your health care provider. Document Released: 02/15/2015 Document Revised: 10/09/2015 Document Reviewed: 11/20/2014 Elsevier Interactive Patient Education  2017 [place] Prevention in the Home Falls can cause injuries. They can happen to people of all ages. There are many things you can do to make your home safe and to help prevent falls. What can I do on the outside of my home? Regularly fix the edges of walkways and driveways and fix any cracks. Remove anything that might make you trip as you walk through a door, such as a raised step or threshold. Trim any bushes or trees on the path to your home. Use bright outdoor lighting. Clear any walking paths of anything that might make someone trip, such as rocks or tools. Regularly check to see if handrails are loose or broken. Make sure that both sides of any steps have handrails. Any raised decks and porches should have guardrails on the edges. Have any leaves, snow, or ice cleared regularly. Use sand or salt on walking paths during winter. Clean up any spills in your garage right away. This includes oil or grease spills. What can I do in the bathroom? Use night lights. Install grab bars by the toilet and in the tub and shower. Do not use towel bars as grab  bars. Use non-skid mats or decals in the tub or shower. If you need to sit down in the shower, use a plastic, non-slip stool. Keep the floor dry. Clean up any water that spills on the floor as soon as it happens. Remove soap buildup in the tub or shower regularly. Attach bath mats securely with double-sided non-slip rug tape. Do not have throw rugs and other things on the floor that can make you trip. What can I do in the bedroom? Use night lights. Make sure that you have a light by your bed that is easy to reach. Do not use any sheets or blankets that are too big for your bed. They should not hang down onto the floor. Have a firm chair that has side arms. You can use this for support while you get dressed. Do not have throw rugs and other things on the floor that can make you trip. What can I do in the kitchen? Clean up any spills right away. Avoid walking on wet floors. Keep items that you use a lot in easy-to-reach places. If you need to reach something above you, use a  strong step stool that has a grab bar. Keep electrical cords out of the way. Do not use floor polish or wax that makes floors slippery. If you must use wax, use non-skid floor wax. Do not have throw rugs and other things on the floor that can make you trip. What can I do with my stairs? Do not leave any items on the stairs. Make sure that there are handrails on both sides of the stairs and use them. Fix handrails that are broken or loose. Make sure that handrails are as long as the stairways. Check any carpeting to make sure that it is firmly attached to the stairs. Fix any carpet that is loose or worn. Avoid having throw rugs at the top or bottom of the stairs. If you do have throw rugs, attach them to the floor with carpet tape. Make sure that you have a light switch at the top of the stairs and the bottom of the stairs. If you do not have them, ask someone to add them for you. What else can I do to help prevent  falls? Wear shoes that: Do not have high heels. Have rubber bottoms. Are comfortable and fit you well. Are closed at the toe. Do not wear sandals. If you use a stepladder: Make sure that it is fully opened. Do not climb a closed stepladder. Make sure that both sides of the stepladder are locked into place. Ask someone to hold it for you, if possible. Clearly mark and make sure that you can see: Any grab bars or handrails. First and last steps. Where the edge of each step is. Use tools that help you move around (mobility aids) if they are needed. These include: Canes. Walkers. Scooters. Crutches. Turn on the lights when you go into a dark area. Replace any light bulbs as soon as they burn out. Set up your furniture so you have a clear path. Avoid moving your furniture around. If any of your floors are uneven, fix them. If there are any pets around you, be aware of where they are. Review your medicines with your doctor. Some medicines can make you feel dizzy. This can increase your chance of falling. Ask your doctor what other things that you can do to help prevent falls. This information is not intended to replace advice given to you by your health care provider. Make sure you discuss any questions you have with your health care provider. Document Released: 11/15/2008 Document Revised: 06/27/2015 Document Reviewed: 02/23/2014 Elsevier Interactive Patient Education  2017 [place]. Opioid Pain Medicine Management Opioids are powerful medicines that are used to treat moderate to severe pain. When used for short periods of time, they can help you to: Sleep better. Do better in physical or occupational therapy. Feel better in the first few days after an injury. Recover from surgery. Opioids should be taken with the supervision of a trained health care provider. They should be taken for the shortest period of time possible. This is because opioids can be addictive, and the longer you  take opioids, the greater your risk of addiction. This addiction can also be called opioid use disorder. What are the risks? Using opioid pain medicines for longer than 3 days increases your risk of side effects. Side effects include: Constipation. Nausea and vomiting. Breathing difficulties (respiratory depression). Drowsiness. Confusion. Opioid use disorder. Itching. Taking opioid pain medicine for a long period of time can affect your ability to do daily tasks. It also puts you at risk  for: Motor vehicle crashes. Depression. Suicide. Heart attack. Overdose, which can be life-threatening. What is a pain treatment plan? A pain treatment plan is an agreement between you and your health care provider. Pain is unique to each person, and treatments vary depending on your condition. To manage your pain, you and your health care provider need to work together. To help you do this: Discuss the goals of your treatment, including how much pain you might expect to have and how you will manage the pain. Review the risks and benefits of taking opioid medicines. Remember that a good treatment plan uses more than one approach and minimizes the chance of side effects. Be honest about the amount of medicines you take and about any drug or alcohol use. Get pain medicine prescriptions from only one health care provider. Pain can be managed with many types of alternative treatments. Ask your health care provider to refer you to one or more specialists who can help you manage pain through: Physical or occupational therapy. Counseling (cognitive behavioral therapy). Good nutrition. Biofeedback. Massage. Meditation. Non-opioid medicine. Following a gentle exercise program. How to use opioid pain medicine Taking medicine Take your pain medicine exactly as told by your health care provider. Take it only when you need it. If your pain gets less severe, you may take less than your prescribed dose if your  health care provider approves. If you are not having pain, do nottake pain medicine unless your health care provider tells you to take it. If your pain is severe, do nottry to treat it yourself by taking more pills than instructed on your prescription. Contact your health care provider for help. Write down the times when you take your pain medicine. It is easy to become confused while on pain medicine. Writing the time can help you avoid overdose. Take other over-the-counter or prescription medicines only as told by your health care provider. Keeping yourself and others safe  While you are taking opioid pain medicine: Do not drive, use machinery, or power tools. Do not sign legal documents. Do not drink alcohol. Do not take sleeping pills. Do not supervise children by yourself. Do not do activities that require climbing or being in high places. Do not go to a lake, river, ocean, spa, or swimming pool. Do not share your pain medicine with anyone. Keep pain medicine in a locked cabinet or in a secure area where pets and children cannot reach it. Stopping your use of opioids If you have been taking opioid medicine for more than a few weeks, you may need to slowly decrease (taper) how much you take until you stop completely. Tapering your use of opioids can decrease your risk of symptoms of withdrawal, such as: Pain and cramping in the abdomen. Nausea. Sweating. Sleepiness. Restlessness. Uncontrollable shaking (tremors). Cravings for the medicine. Do not attempt to taper your use of opioids on your own. Talk with your health care provider about how to do this. Your health care provider may prescribe a step-down schedule based on how much medicine you are taking and how long you have been taking it. Getting rid of leftover pills Do not save any leftover pills. Get rid of leftover pills safely by: Taking the medicine to a prescription take-back program. This is usually offered by the county or  law enforcement. Bringing them to a pharmacy that has a drug disposal container. Flushing them down the toilet. Check the label or package insert of your medicine to see whether this is safe  to do. Throwing them out in the trash. Check the label or package insert of your medicine to see whether this is safe to do. If it is safe to throw it out, remove the medicine from the original container, put it into a sealable bag or container, and mix it with used coffee grounds, food scraps, dirt, or cat litter before putting it in the trash. Follow these instructions at home: Activity Do exercises as told by your health care provider. Avoid activities that make your pain worse. Return to your normal activities as told by your health care provider. Ask your health care provider what activities are safe for you. General instructions You may need to take these actions to prevent or treat constipation: Drink enough fluid to keep your urine pale yellow. Take over-the-counter or prescription medicines. Eat foods that are high in fiber, such as beans, whole grains, and fresh fruits and vegetables. Limit foods that are high in fat and processed sugars, such as fried or sweet foods. Keep all follow-up visits. This is important. Where to find support If you have been taking opioids for a long time, you may benefit from receiving support for quitting from a local support group or counselor. Ask your health care provider for a referral to these resources in your area. Where to find more information Centers for Disease Control and Prevention (CDC): http://www.wolf.info/ [place] Food and Drug Administration (FDA): [place] Get help right away if: You may have taken too much of an opioid (overdosed). Common symptoms of an overdose: Your breathing is slower or more shallow than normal. You have a very slow heartbeat (pulse). You have slurred speech. You have nausea and vomiting. Your pupils become very small. You have  other potential symptoms: You are very confused. You faint or feel like you will faint. You have cold, clammy skin. You have blue lips or fingernails. You have thoughts of harming yourself or harming others. These symptoms may represent a serious problem that is an emergency. Do not wait to see if the symptoms will go away. Get medical help right away. Call your local emergency services (911 in the [place] Do not drive yourself to the hospital.  If you ever feel like you may hurt yourself or others, or have thoughts about taking your own life, get help right away. Go to your nearest emergency department or: Call your local emergency services (911 in the [place] Call the [facility] 778 472 1935 in the [place] Call a suicide crisis helpline, such as the [place] at 534-686-0906 or 988 in the [place]. This is open 24 hours a day in the [place] Text the Crisis Text Line at 630-146-9263 (in the [place].). Summary Opioid medicines can help you manage moderate to severe pain for a short period of time. A pain treatment plan is an agreement between you and your health care provider. Discuss the goals of your treatment, including how much pain you might expect to have and how you will manage the pain. If you think that you or someone else may have taken too much of an opioid, get medical help right away. This information is not intended to replace advice given to you by your health care provider. Make sure you discuss any questions you have with your health care provider. Document Revised: 08/14/2020 Document Reviewed: 05/01/2020 Elsevier Patient Education  [place].

## 2022-02-06 NOTE — Progress Notes (Signed)
Subjective:   Alan Schmidt is a 77 y.o. male who presents for Medicare Annual/Subsequent preventive examination.  Review of Systems    No ROS.  Medicare Wellness Virtual Visit.  Visual/audio telehealth visit, UTA vital signs.   See social history for additional risk factors.   Cardiac Risk Factors include: advanced age (>4mn, >>28women);male gender;diabetes mellitus;hypertension     Objective:    Today's Vitals   02/06/22 1309  Weight: 250 lb (113.4 kg)  Height: '6\' 1"'$  (1.854 m)   Body mass index is 32.98 kg/m.     02/06/2022    1:33 PM 09/29/2021    1:39 PM 02/05/2021    3:04 PM 12/25/2020   11:25 AM 04/26/2020    5:45 PM 11/23/2019    9:11 PM 10/18/2019    4:30 PM  Advanced Directives  Does Patient Have a Medical Advance Directive? No No No No No No No  Would patient like information on creating a medical advance directive? No - Patient declined No - Patient declined No - Patient declined No - Patient declined No - Patient declined No - Patient declined No - Patient declined    Current Medications (verified) Outpatient Encounter Medications as of 02/06/2022  Medication Sig   ACCU-CHEK GUIDE test strip USE AS INSTRUCTED DX CODE: E11.9   acetaminophen (TYLENOL) 500 MG tablet Take 500 mg by mouth every 6 (six) hours as needed.   ALPRAZolam (XANAX) 1 MG tablet TAKE 1/2 TABLET BY MOUTH 2 TIMES DAILY AS NEEDED FOR ANXIETY.   aspirin EC 81 MG tablet Take 81 mg by mouth daily.   buPROPion (WELLBUTRIN XL) 150 MG 24 hr tablet TAKE 1 TABLET (150 MG TOTAL) BY MOUTH DAILY FOR 7 DAYS, THEN 2 TABLETS (300 MG TOTAL) DAILY.   carvedilol (COREG) 25 MG tablet Take 1 tablet (25 mg total) by mouth 2 (two) times daily.   Continuous Blood Gluc Receiver (FREESTYLE LIBRE 14 DAY READER) DEVI 1 Device by Does not apply route daily. Use to scan to check blood sugar up to 7 times daily; E11.42,   Continuous Blood Gluc Sensor (FREESTYLE LIBRE 14 DAY SENSOR) MISC 1 Device by Does not apply route  every 14 (fourteen) days. E11.42   desoximetasone (TOPICORT) 0.25 % cream APPLY CREAM TO AFFECTED AREA TWO TIMES DAILY, FOR UP TO 7 DAYS, DO NOT APPLY TO FACE   empagliflozin (JARDIANCE) 25 MG TABS tablet Take 1 tablet (25 mg total) by mouth daily.   EPINEPHrine (EPIPEN 2-PAK) 0.3 mg/0.3 mL IJ SOAJ injection Inject 0.3 mg into the muscle as needed for anaphylaxis.   escitalopram (LEXAPRO) 20 MG tablet Take 1 tablet (20 mg total) by mouth daily.   gabapentin (NEURONTIN) 250 MG/5ML solution Take 6 ml by mouth at bedtime.   hydrochlorothiazide (HYDRODIURIL) 25 MG tablet Take 1 tablet (25 mg total) by mouth daily.   insulin lispro (HUMALOG KWIKPEN) 100 UNIT/ML KwikPen INJECT 5-7 UNITS INTO THE SKIN THREE TIMES DAILY WITH A MEAL   Insulin Pen Needle (BD PEN NEEDLE NANO U/F) 32G X 4 MM MISC USE EVERY DAY   loratadine (CLARITIN) 10 MG tablet Take 1 tablet (10 mg total) by mouth daily.   lubiprostone (AMITIZA) 8 MCG capsule Take 1 capsule (8 mcg total) by mouth 2 (two) times daily with a meal. Swallow the medication whole. Do not break or chew the medication.   metFORMIN (GLUCOPHAGE) 1000 MG tablet Take 1 tablet (1,000 mg total) by mouth 2 (two) times daily with a meal.  methocarbamol (ROBAXIN) 500 MG tablet Take 500 mg by mouth 2 (two) times daily.   mometasone (NASONEX) 50 MCG/ACT nasal spray USE 2 SPRAYS INTO EACH NOSTRIL ONCE DAILY   morphine (MS CONTIN) 30 MG 12 hr tablet Take 1 tablet (30 mg total) by mouth every 12 (twelve) hours. Must last 30 days. Do not break tablet   morphine (MS CONTIN) 30 MG 12 hr tablet Take 1 tablet (30 mg total) by mouth every 12 (twelve) hours. Must last 30 days. Do not break tablet   [START ON 03/07/2022] morphine (MS CONTIN) 30 MG 12 hr tablet Take 1 tablet (30 mg total) by mouth every 12 (twelve) hours. Must last 30 days. Do not break tablet   naloxone (NARCAN) nasal spray 4 mg/0.1 mL Place 1 spray into the nose as needed for up to 365 doses (for opioid-induced  respiratory depresssion). In case of emergency (overdose), spray once into each nostril. If no response within 3 minutes, repeat application and call [phone].   olmesartan (BENICAR) 40 MG tablet Take 1 tablet (40 mg total) by mouth daily.   OZEMPIC, 2 MG/DOSE, 8 MG/3ML SOPN Inject into the skin.   pantoprazole (PROTONIX) 40 MG tablet Take 1 tablet (40 mg total) by mouth daily.   rosuvastatin (CRESTOR) 40 MG tablet Take 1 tablet (40 mg total) by mouth daily.   sennosides-docusate sodium (SENOKOT-S) 8.6-50 MG tablet Take 1 tablet by mouth daily as needed.    tamsulosin (FLOMAX) 0.4 MG CAPS capsule Take 1 capsule (0.4 mg total) by mouth 2 (two) times daily.   tirzepatide (MOUNJARO) 5 MG/0.5ML Pen INJECT 5 MG INTO THE SKIN ONCE A WEEK. START AFTER YOU COMPLETE THE 4 WEEKS OF THE 2.5 MG DOSE   TRESIBA FLEXTOUCH 100 UNIT/ML FlexTouch Pen INJECT 18 UNITS INTO THE SKIN DAILY AT 10 PM.   Wheat Dextrin (BENEFIBER) POWD Take 6 g by mouth 3 (three) times daily before meals. (2 tsp = 6 g)   No facility-administered encounter medications on file as of 02/06/2022.    Allergies (verified) Contrast media [iodinated contrast media], Iodine, and Shellfish allergy   History: Past Medical History:  Diagnosis Date   Acute postoperative pain 11/24/2016   Anxiety    Chronic hip pain (Right) 12/05/2014   Chronic lumbar pain    Depression    Diabetes mellitus without complication ([place])    Hyperlipidemia    Hypertension    Kidney stones    Migraines    Past Surgical History:  Procedure Laterality Date   right hip surgery     4 surgeries   TONSILLECTOMY     TOTAL HIP ARTHROPLASTY     Family History  Problem Relation Age of Onset   Cancer Mother    Heart disease Father    Stroke Father    Diabetes Father    Diabetes Sister    Diabetes Sister    Social History   Socioeconomic History   Marital status: Divorced    Spouse name: 1 Bio; 6 Adopted   Number of children: 7   Years of education: doctorate    Highest education level: Doctorate  Occupational History   Occupation: Retired  Tobacco Use   Smoking status: Former    Types: Cigarettes   Smokeless tobacco: Never  Scientific laboratory technician Use: Never used  Substance and Sexual Activity   Alcohol use: No    Alcohol/week: 0.0 standard drinks of alcohol   Drug use: No   Sexual activity: Not Currently  Other Topics Concern   Not on file  Social History Narrative   Per patient he has 1 biological child and 6 adopted children   Social Determinants of Health   Financial Resource Strain: Low Risk  (02/06/2022)   Overall Financial Resource Strain (CARDIA)    Difficulty of Paying Living Expenses: Not very hard  Food Insecurity: No Food Insecurity (02/06/2022)   Hunger Vital Sign    Worried About Running Out of Food in the Last Year: Never true    Ran Out of Food in the Last Year: Never true  Transportation Needs: No Transportation Needs (02/06/2022)   PRAPARE - Hydrologist (Medical): No    Lack of Transportation (Non-Medical): No  Physical Activity: Unknown (02/06/2022)   Exercise Vital Sign    Days of Exercise per Week: 0 days    Minutes of Exercise per Session: Not on file  Recent Concern: Physical Activity - Insufficiently Active (02/06/2022)   Exercise Vital Sign    Days of Exercise per Week: 3 days    Minutes of Exercise per Session: 30 min  Stress: No Stress Concern Present (02/06/2022)   [place]    Feeling of Stress : Only a little  Social Connections: Moderately Integrated (02/06/2022)   Social Connection and Isolation Panel [NHANES]    Frequency of Communication with Friends and Family: More than three times a week    Frequency of Social Gatherings with Friends and Family: More than three times a week    Attends Religious Services: More than 4 times per year    Active Member of [employer] or Organizations: No    Attends Arts administrator: 1 to 4 times per year    Marital Status: Divorced    Tobacco Counseling Counseling given: Not Answered   Clinical Intake:  Pre-visit preparation completed: Yes        Diabetes: Yes (Followed by pcp)  How often do you need to have someone help you when you read instructions, pamphlets, or other written materials from your doctor or pharmacy?: 1 - Never  Nutrition Risk Assessment: Has the patient had any N/V/D within the last 2 months?  No  Does the patient have any non-healing wounds?  No  Has the patient had any unintentional weight loss or weight gain?  No   Diabetes: Is the patient diabetic?  Yes  If diabetic, was a CBG obtained today?  Yes, FBS 130 Did the patient bring in their glucometer from home?  No  How often do you monitor your CBG's? TID.   Financial Strains and Diabetes Management: Are you having any financial strains with the device, your supplies or your medication? No.  Does the patient want to be seen by Chronic Care Management for management of their diabetes?  No  Would the patient like to be referred to a Nutritionist or for Diabetic Management?  No     Interpreter Needed?: No      Activities of Daily Living    02/06/2022    1:20 PM  In your present state of health, do you have any difficulty performing the following activities:  Hearing? 0  Vision? 0  Difficulty concentrating or making decisions? 0  Walking or climbing stairs? 1  Comment Cane/walker in use  Dressing or bathing? 0  Doing errands, shopping? 0  Preparing Food and eating ? N  Using the Toilet? N  In the past six months,  have you accidently leaked urine? Y  Comment Self managed. Patient deferred follow up at this time.  Do you have problems with loss of bowel control? N  Managing your Medications? N  Managing your Finances? N  Housekeeping or managing your Housekeeping? Y  Comment Family assist    Patient Care Team: [name], MD as PCP - General  (Family Medicine)  Indicate any recent Medical Services you may have received from other than [facility] providers in the past year (date may be approximate).     Assessment:   This is a routine wellness examination for Alan Schmidt.  I connected with  [name] on 02/06/22 by a audio enabled telemedicine application and verified that I am speaking with the correct person using two identifiers.  Patient Location: Home  Provider Location: Office/Clinic  I discussed the limitations of evaluation and management by telemedicine. The patient expressed understanding and agreed to proceed.   Hearing/Vision screen Hearing Screening - Comments:: Patient is able to hear conversational tones without difficulty. No issues reported.  Vision Screening - Comments:: Wears corrective lenses Cataract extraction, bilateral No retinopathy reported  Dietary issues and exercise activities discussed: Current Exercise Habits: Home exercise routine, Intensity: Mild Healthy diet   Goals Addressed             This Visit's Progress    Maintain healthy lifestyle       Healthy diet Stay active Stay hydrated       Depression Screen    02/06/2022    1:17 PM 09/29/2021    1:38 PM 07/03/2021    2:15 PM 03/18/2021   11:49 AM 02/05/2021    3:08 PM 12/25/2020   11:17 AM 04/26/2020    5:43 PM  PHQ 2/9 Scores  PHQ - 2 Score 0 0 1 0 0 0 0    Fall Risk    02/06/2022    1:33 PM 12/31/2021   12:40 PM 09/29/2021    1:38 PM 09/02/2021   10:18 AM 07/03/2021    2:04 PM  [place] in the past year? 1 1 0 1 1  Number falls in past yr: 1 1  0 1  Injury with Fall?  1  0 1  Comment  neck, back     Risk for fall due to : History of fall(s);Impaired balance/gait   History of fall(s) History of fall(s);Impaired balance/gait;Impaired mobility  Risk for fall due to: Comment Cane/walker in use      Follow up Falls evaluation completed;Falls prevention discussed;Education provided   Falls evaluation completed Falls  prevention discussed;Education provided;Falls evaluation completed    FALL RISK PREVENTION PERTAINING TO THE HOME: Home free of loose throw rugs in walkways, pet beds, electrical cords, etc? Yes  Adequate lighting in your home to reduce risk of falls? Yes   ASSISTIVE DEVICES UTILIZED TO PREVENT FALLS: Life alert? No  Use of a cane, walker or w/c? No  Grab bars in the bathroom? No  Shower chair or bench in shower? No  Elevated toilet seat or a handicapped toilet? No   TIMED UP AND GO: Was the test performed? No .   Cognitive Function:    08/17/2019    1:49 PM  MMSE - Mini Mental State Exam  Not completed: Unable to complete        02/06/2022    1:28 PM  6CIT Screen  What Year? 0 points  What month? 0 points  What time? 0 points  Count back from 20 0 points  Months in reverse 0 points  Repeat phrase 0 points  Total Score 0 points    Immunizations Immunization History  Administered Date(s) Administered   Fluad Quad(high Dose 65+) 10/26/2018, 01/05/2022   Influenza, High Dose Seasonal PF 11/21/2015, 10/27/2016, 11/06/2017, 11/24/2019, 11/13/2020   Moderna Covid-19 Vaccine Bivalent Booster 83yr & up 11/13/2020   Moderna Sars-Covid-2 Vaccination 03/23/2019, 04/20/2019, 11/24/2019, 05/02/2020   Pfizer Covid-19 Vaccine Bivalent Booster 184yr& up 08/25/2021   Pneumococcal Conjugate-13 11/21/2015   Pneumococcal Polysaccharide-23 10/21/2017   Respiratory Syncytial Virus Vaccine,Recomb Aduvanted(Arexvy) 01/05/2022   Tdap 04/09/2015   Zoster Recombinat (Shingrix) 04/08/2021, 08/25/2021   Screening Tests Health Maintenance  Topic Date Due   Diabetic kidney evaluation - Urine ACR  Never done   Hepatitis C Screening  Never done   COLONOSCOPY (Pts [id number] coverage will need to be confirmed)  Never done   COLON CANCER SCREENING ANNUAL FOBT  08/29/2021   COVID-19 Vaccine (7 - 2023-24 season) 02/22/2022 (Originally 10/20/2021)   HEMOGLOBIN A1C  03/05/2022   Diabetic  kidney evaluation - eGFR measurement  04/26/2022   FOOT EXAM  11/17/2022   OPHTHALMOLOGY EXAM  12/02/2022   Medicare Annual Wellness (AWV)  02/07/2023   DTaP/Tdap/Td (2 - Td or Tdap) 04/08/2025   Pneumonia Vaccine 65+25ears old  Completed   INFLUENZA VACCINE  Completed   Zoster Vaccines- Shingrix  Completed   HPV VACCINES  Aged Out    Health Maintenance  Labs followed by pcp  Health Maintenance Due  Topic Date Due   Diabetic kidney evaluation - Urine ACR  Never done   Hepatitis C Screening  Never done   COLONOSCOPY (Pts [id number] coverage will need to be confirmed)  Never done   COLON CANCER SCREENING ANNUAL FOBT  08/29/2021   Colonoscopy- deferred per patient.   Lung Cancer Screening: (Low Dose CT Chest recommended if Age 76-831-80rs, 30 pack-year currently smoking OR have quit w/in 15years.) does not qualify.   Hepatitis C Screening: deferred per patient.   Vision Screening: Recommended annual ophthalmology exams for early detection of glaucoma and other disorders of the eye.  Dental Screening: Recommended annual dental exams for proper oral hygiene  Community Resource Referral / Chronic Care Management: CRR required this visit?  No   CCM required this visit?  No      Plan:     I have personally reviewed and noted the following in the patient's chart:   Medical and social history Use of alcohol, tobacco or illicit drugs  Current medications and supplements including opioid prescriptions. Patient is currently taking opioid prescriptions. Information provided to patient regarding non-opioid alternatives. Patient advised to discuss non-opioid treatment plan with their provider. Followed by pain clinic.  Functional ability and status Nutritional status Physical activity Advanced directives List of other physicians Hospitalizations, surgeries, and ER visits in previous 12 months Vitals Screenings to include cognitive, depression, and falls Referrals and  appointments  In addition, I have reviewed and discussed with patient certain preventive protocols, quality metrics, and best practice recommendations. A written personalized care plan for preventive services as well as general preventive health recommendations were provided to patient.     DeniLeta JunglingN   1/5/0/08/6224

## 2022-02-10 ENCOUNTER — Ambulatory Visit (INDEPENDENT_AMBULATORY_CARE_PROVIDER_SITE_OTHER): Payer: Medicare Other

## 2022-02-10 ENCOUNTER — Ambulatory Visit (INDEPENDENT_AMBULATORY_CARE_PROVIDER_SITE_OTHER): Payer: Medicare Other | Admitting: Podiatry

## 2022-02-10 ENCOUNTER — Ambulatory Visit: Payer: Medicare Other | Admitting: Podiatry

## 2022-02-10 VITALS — BP 184/97 | HR 75

## 2022-02-10 DIAGNOSIS — M79672 Pain in left foot: Secondary | ICD-10-CM

## 2022-02-10 DIAGNOSIS — M7752 Other enthesopathy of left foot: Secondary | ICD-10-CM

## 2022-02-10 DIAGNOSIS — M79674 Pain in right toe(s): Secondary | ICD-10-CM

## 2022-02-10 DIAGNOSIS — B351 Tinea unguium: Secondary | ICD-10-CM | POA: Diagnosis not present

## 2022-02-10 DIAGNOSIS — M79675 Pain in left toe(s): Secondary | ICD-10-CM | POA: Diagnosis not present

## 2022-02-10 MED ORDER — BETAMETHASONE SOD PHOS & ACET 6 (3-3) MG/ML IJ SUSP
3.0000 mg | Freq: Once | INTRAMUSCULAR | Status: AC
  Administered 2022-02-10: 3 mg via INTRA_ARTICULAR

## 2022-02-10 NOTE — Progress Notes (Signed)
   Chief Complaint  Patient presents with   Diabetes    Diabetic foot care, nail trim, left ankle has been unstable     SUBJECTIVE Patient with a history of diabetes mellitus presents to office today complaining of elongated, thickened nails that cause pain while ambulating in shoes.  Patient is unable to trim their own nails.  Patient also complains of pain and tenderness to the left ankle this been ongoing for about 1 month now.  He does have a history of chronic lower extremity edema but he has had poor experiences with compression hose.  Denies a history of injury.  Has not anything for treatment.  Patient is here for further evaluation and treatment.  Past Medical History:  Diagnosis Date   Acute postoperative pain 11/24/2016   Anxiety    Chronic hip pain (Right) 12/05/2014   Chronic lumbar pain    Depression    Diabetes mellitus without complication ([place])    Hyperlipidemia    Hypertension    Kidney stones    Migraines     Allergies  Allergen Reactions   Contrast Media [Iodinated Contrast Media] Swelling   Iodine Swelling   Shellfish Allergy Nausea And Vomiting and Swelling     OBJECTIVE General Patient is awake, alert, and oriented x 3 and in no acute distress. Derm Skin is dry and supple bilateral. Negative open lesions or macerations. Remaining integument unremarkable. Nails are tender, long, thickened and dystrophic with subungual debris, consistent with onychomycosis, 1-5 bilateral. No signs of infection noted. Vasc  DP and PT pedal pulses palpable bilaterally. Temperature gradient within normal limits.  Neuro Epicritic and protective threshold sensation diminished bilaterally.  Musculoskeletal Exam No symptomatic pedal deformities noted bilateral. Muscular strength within normal limits. Radiographic exam LT ankle 02/10/2022 normal osseous mineralization.  Calcifications noted within the vessels of the legs.  Mild to moderate degenerative changes noted.  No acute  fractures identified  ASSESSMENT 1. Diabetes Mellitus w/ peripheral neuropathy 2.  Pain due to onychomycosis of toenails bilateral 3.  Capsulitis left ankle  PLAN OF CARE 1. Patient evaluated today. 2. Instructed to maintain good pedal hygiene and foot care. Stressed importance of controlling blood sugar.  3. Mechanical debridement of nails 1-5 bilaterally performed using a nail nipper. Filed with dremel without incident.  4.  Injection of 0.5 cc Celestone Soluspan injected into the left ankle joint medial aspect 5.  Return to clinic in 3 mos.     Edrick Kins, DPM [facility]  Dr. Edrick Kins, DPM    [address], [place] [address]                Office [phone]  Fax [phone]

## 2022-02-12 ENCOUNTER — Encounter: Payer: Self-pay | Admitting: Family Medicine

## 2022-02-15 ENCOUNTER — Telehealth: Payer: Self-pay | Admitting: Family Medicine

## 2022-02-15 DIAGNOSIS — E1142 Type 2 diabetes mellitus with diabetic polyneuropathy: Secondary | ICD-10-CM

## 2022-02-17 ENCOUNTER — Other Ambulatory Visit: Payer: Self-pay

## 2022-02-17 MED ORDER — ALPRAZOLAM 1 MG PO TABS: ORAL_TABLET | ORAL | 0 refills | Status: DC

## 2022-02-17 NOTE — Progress Notes (Signed)
Sent to pharmacy 

## 2022-02-20 ENCOUNTER — Other Ambulatory Visit: Payer: Self-pay

## 2022-02-20 MED ORDER — ALPRAZOLAM 1 MG PO TABS: ORAL_TABLET | ORAL | 0 refills | Status: DC

## 2022-02-20 NOTE — Telephone Encounter (Signed)
Sent to pharmacy 

## 2022-02-20 NOTE — Telephone Encounter (Signed)
Patient called for his refill on ALPRAZolam (XANAX) 1 MG tablet. This medication was sent to his mail order pharmacy and it should have been sent to [facility] on [address], [place]. Please send there.

## 2022-02-20 NOTE — Addendum Note (Signed)
Addended by: [name] on: 02/20/2022 05:02 PM   Modules accepted: Orders

## 2022-02-24 ENCOUNTER — Other Ambulatory Visit: Payer: Self-pay | Admitting: Podiatry

## 2022-02-24 ENCOUNTER — Ambulatory Visit: Payer: Medicare Other | Admitting: Family Medicine

## 2022-02-24 DIAGNOSIS — B351 Tinea unguium: Secondary | ICD-10-CM

## 2022-02-24 DIAGNOSIS — M7752 Other enthesopathy of left foot: Secondary | ICD-10-CM

## 2022-02-24 DIAGNOSIS — E1165 Type 2 diabetes mellitus with hyperglycemia: Secondary | ICD-10-CM | POA: Diagnosis not present

## 2022-02-24 DIAGNOSIS — M79672 Pain in left foot: Secondary | ICD-10-CM

## 2022-03-02 ENCOUNTER — Other Ambulatory Visit: Payer: Self-pay | Admitting: Family Medicine

## 2022-03-02 DIAGNOSIS — E1142 Type 2 diabetes mellitus with diabetic polyneuropathy: Secondary | ICD-10-CM

## 2022-03-10 ENCOUNTER — Telehealth: Payer: Self-pay | Admitting: Family Medicine

## 2022-03-10 NOTE — Telephone Encounter (Signed)
Patient called, he will be having a dental procedure in a few weeks. His dentist needs to know if patient needs to take anything before having procedure. Please call patient.  Also;  Prescription Request  03/10/2022  Is this a "Controlled Substance" medicine? No  LOV: 09/02/2021  What is the name of the medication or equipment? lubiprostone (AMITIZA) 8 MCG capsule  Have you contacted your pharmacy to request a refill? No   Which pharmacy would you like this sent to?  [id number]Phone: [phone]: [phone]   Patient notified that their request is being sent to the clinical staff for review and that they should receive a response within 2 business days.   [place]

## 2022-03-12 NOTE — Telephone Encounter (Signed)
LM for pt to cb 

## 2022-03-12 NOTE — Telephone Encounter (Signed)
Please find out if the patient has a prosthetic heart valve? To my knowledge he does not, though I would like to confirm with him. If he does not have a prosthetic heart valve then he does not need to take anything prior to the dental procedure.

## 2022-03-16 NOTE — Telephone Encounter (Signed)
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 Pt would like to be called

## 2022-03-17 ENCOUNTER — Other Ambulatory Visit: Payer: Self-pay

## 2022-03-17 DIAGNOSIS — T402X5A Adverse effect of other opioids, initial encounter: Secondary | ICD-10-CM

## 2022-03-17 MED ORDER — LUBIPROSTONE 8 MCG PO CAPS: 8.0000 ug | ORAL_CAPSULE | Freq: Two times a day (BID) | ORAL | 2 refills | Status: DC

## 2022-03-17 NOTE — Telephone Encounter (Signed)
Pt advised if no prosthetic heart valve, does not need anything for dental procedure.

## 2022-03-23 ENCOUNTER — Other Ambulatory Visit: Payer: Self-pay | Admitting: Family Medicine

## 2022-03-25 ENCOUNTER — Ambulatory Visit: Payer: 59 | Attending: Pain Medicine | Admitting: Pain Medicine

## 2022-03-25 ENCOUNTER — Encounter: Payer: Self-pay | Admitting: Pain Medicine

## 2022-03-25 VITALS — BP 126/69 | HR 87 | Temp 97.0°F | Ht 73.0 in | Wt 250.0 lb

## 2022-03-25 DIAGNOSIS — M25561 Pain in right knee: Secondary | ICD-10-CM | POA: Diagnosis not present

## 2022-03-25 DIAGNOSIS — M25551 Pain in right hip: Secondary | ICD-10-CM | POA: Diagnosis not present

## 2022-03-25 DIAGNOSIS — M51369 Other intervertebral disc degeneration, lumbar region without mention of lumbar back pain or lower extremity pain: Secondary | ICD-10-CM

## 2022-03-25 DIAGNOSIS — M47816 Spondylosis without myelopathy or radiculopathy, lumbar region: Secondary | ICD-10-CM | POA: Diagnosis not present

## 2022-03-25 DIAGNOSIS — M542 Cervicalgia: Secondary | ICD-10-CM | POA: Insufficient documentation

## 2022-03-25 DIAGNOSIS — G8929 Other chronic pain: Secondary | ICD-10-CM

## 2022-03-25 DIAGNOSIS — Z79891 Long term (current) use of opiate analgesic: Secondary | ICD-10-CM

## 2022-03-25 DIAGNOSIS — M5136 Other intervertebral disc degeneration, lumbar region: Secondary | ICD-10-CM | POA: Insufficient documentation

## 2022-03-25 DIAGNOSIS — M25562 Pain in left knee: Secondary | ICD-10-CM | POA: Insufficient documentation

## 2022-03-25 DIAGNOSIS — Z79899 Other long term (current) drug therapy: Secondary | ICD-10-CM | POA: Diagnosis not present

## 2022-03-25 DIAGNOSIS — M79605 Pain in left leg: Secondary | ICD-10-CM | POA: Diagnosis not present

## 2022-03-25 DIAGNOSIS — M5030 Other cervical disc degeneration, unspecified cervical region: Secondary | ICD-10-CM

## 2022-03-25 DIAGNOSIS — Z96641 Presence of right artificial hip joint: Secondary | ICD-10-CM | POA: Insufficient documentation

## 2022-03-25 DIAGNOSIS — G894 Chronic pain syndrome: Secondary | ICD-10-CM | POA: Diagnosis not present

## 2022-03-25 DIAGNOSIS — M79604 Pain in right leg: Secondary | ICD-10-CM | POA: Insufficient documentation

## 2022-03-25 DIAGNOSIS — M545 Low back pain, unspecified: Secondary | ICD-10-CM

## 2022-03-25 DIAGNOSIS — R2689 Other abnormalities of gait and mobility: Secondary | ICD-10-CM

## 2022-03-25 MED ORDER — MORPHINE SULFATE ER 30 MG PO TBCR: 30.0000 mg | EXTENDED_RELEASE_TABLET | Freq: Two times a day (BID) | ORAL | 0 refills | Status: DC

## 2022-03-25 NOTE — Progress Notes (Unsigned)
PROVIDER NOTE: Information contained herein reflects review and annotations entered in association with encounter. Interpretation of such information and data should be left to medically-trained personnel. Information provided to patient can be located elsewhere in the medical record under "Patient Instructions". Document created using STT-dictation technology, any transcriptional errors that may result from process are unintentional.    Patient: Alan Schmidt  Service Category: E/M  Provider: Gaspar Cola, MD  DOB: [birth year]  DOS: 03/25/2022  Referring Provider: [name], MD  MRN: [id number]  Specialty: Interventional Pain Management  PCP: [name], MD  Type: Established Patient  Setting: Ambulatory outpatient    Location: Office  Delivery: Face-to-face     HPI  Alan Schmidt, a 77 y.o. year old male, is here today because of his Acute exacerbation of chronic low back pain [M54.50, G89.29]. Alan Schmidt primary complain today is Back Pain (lower) Last encounter: My last encounter with him was on 01/02/2022. Pertinent problems: Alan Schmidt has Chronic low back pain (Bilateral) (R>L) w/ sciatica (Bilateral); Lumbar facet syndrome (Bilateral) (R>L); Lumbar spondylosis; Diabetic peripheral neuropathy ([place]); Chronic neck pain (midline over the C7 spinous processes) (L>R); Neurogenic pain; Neuropathic pain; Chronic lower extremity pain (2ry area of Pain) (Bilateral) (R>L); 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) (Right L5 dermatome); History of total hip replacement (Right); Chronic hip pain (Right); Abnormal nerve conduction studies (severe bilateral lower extremity polyneuropathy); Chronic shoulder pain (Bilateral) (R>L); Occipital neuralgia (Left); Cervicogenic headache (Left); Chronic shoulder radicular pain (Left); Chronic cervical radicular pain (Left); Chronic pain syndrome; Lumbar facet joint osteoarthritis (Bilateral); Chronic headache; Chronic tension-type  headache, intractable; Coccygodynia; DDD (degenerative disc disease), lumbar; Chronic musculoskeletal pain; Chronic knee pain (Bilateral); Thumb pain (Right); 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 Pain Assessment: Severity of Chronic pain is reported as a 2 /10. Location: Back Lower/Denies. Onset: More than a month ago. Quality: Aching, Constant, Stabbing, Discomfort, Throbbing. Timing: Constant. Modifying factor(s): Meds, heating. Vitals:  height is 6' 1"$  (1.854 m) and weight is 250 lb (113.4 kg). His temperature is 97 F (36.1 C) (abnormal). His blood pressure is 126/69 and his pulse is 87. His oxygen saturation is 96%.  BMI: Estimated body mass index is 32.98 kg/m as calculated from the following:   Height as of this encounter: 6' 1"$  (1.854 m).   Weight as of this encounter: 250 lb (113.4 kg).  Reason for encounter: medication management.  The patient indicates doing well with the current medication regimen. No adverse reactions or side effects reported to the medications.   The patient comes in today indicating that approximately 3 days after he had his last procedure he had a fall and because of this he reinjured his lower back and neck area.  Today he indicates the primary pain to be that of the lower back (Bilateral) (R>L) with a component of the pain being in the midline.  He denies any lower extremity pain, numbness, or weakness.  He also refers that he has noticed that he is unstable on his feet and although he is not having any symptoms compatible with vertigo, he feels that his balance seems to be off.   Today I will enter a referral to physical  therapy to work on his balance so as to prevent any further falls, if at all possible.  The patient also states that because of this same fall, he has also been experiencing more neck pain (Bilateral) (L>R).  He refers that this pain is not quite as bad as the low back pain and therefore he wants me to address the issue of the low back pain first then later on, if his neck pain has not improved, he would like something to be done for that as well.  Today again he seems to be having pain on Lumbar hyperextension on rotation as well as at the [facility] maneuver, bilaterally, suggestive of persistent bilateral lumbar facet pain.  In view of this and the fact that he had done well with the lumbar facet blocks, will again schedule him to come back for treatment of that area.  The plan was shared with the patient who understood and accepted.  RTCB: 07/05/2022   Pharmacotherapy Assessment  Analgesic: Morphine ER (MS Contin) 30 mg, 1 tab p.o. twice daily (60 mg/day).  (Previously on oxycodone IR 10 mg every 6 hours (40 mg/day)) MME/day: 60 mg/day.   Monitoring:  PMP: PDMP reviewed during this encounter.       Pharmacotherapy: No side-effects or adverse reactions reported. Compliance: No problems identified. Effectiveness: Clinically acceptable.  Chauncey Fischer, RN  03/25/2022  3:16 PM  Sign when Signing Visit Nursing Pain Medication Assessment:  Safety precautions to be maintained throughout the outpatient stay will include: orient to surroundings, keep bed in low position, maintain call bell within reach at all times, provide assistance with transfer out of bed and ambulation.  Medication Inspection Compliance: Pill count conducted under aseptic conditions, in front of the patient. Neither the pills nor the bottle was removed from the patient's sight at any time. Once count was completed pills were immediately returned to the patient in their original  bottle.  Medication: Morphine IR Pill/Patch Count:  7 of 60 pills remain Pill/Patch Appearance: Markings consistent with prescribed medication Bottle Appearance: Standard pharmacy container. Clearly labeled. Filled Date: 1 / 35 / 2024 Last Medication intake:  TodaySafety precautions to be maintained throughout the outpatient stay will include: orient to surroundings, keep bed in low position, maintain call bell within reach at all times, provide assistance with transfer out of bed and ambulation.     No results found for: "CBDTHCR" No results found for: "D8THCCBX" No results found for: "D9THCCBX"  UDS:  Summary  Date Value Ref Range Status  09/29/2021 Note  Final    Comment:    ==================================================================== [employer] Select 13 (MW) ==================================================================== Test                             Result       Flag       Units  Drug Present and Declared for Prescription Verification   Alprazolam                     109          EXPECTED   ng/mg creat   Alpha-hydroxyalprazolam        314          EXPECTED   ng/mg creat    Source of alprazolam is a scheduled prescription medication. Alpha-    hydroxyalprazolam is an expected metabolite of alprazolam.    Morphine                       >  9709        EXPECTED   ng/mg creat   Normorphine                    290          EXPECTED   ng/mg creat    Potential sources of large amounts of morphine in the absence of    codeine include administration of morphine or use of heroin.     Normorphine is an expected metabolite of morphine.    Hydromorphone                  236          EXPECTED   ng/mg creat    Hydromorphone may be present as a metabolite of morphine;    concentrations of hydromorphone rarely exceed 5% of the morphine    concentration when this is the source of hydromorphone.  ==================================================================== Test                       Result    Flag   Units      Ref Range   Creatinine              103              mg/dL      >=20 ==================================================================== Declared Medications:  The flagging and interpretation on this report are based on the  following declared medications.  Unexpected results may arise from  inaccuracies in the declared medications.   **Note: The testing scope of this panel includes these medications:   Alprazolam (Xanax)  Morphine (MS Contin)   **Note: The testing scope of this panel does not include the  following reported medications:   Acetaminophen (Tylenol)  Aspirin  Bupropion (Wellbutrin)  Carvedilol (Coreg)  Desoximetasone (Topicort)  Empagliflozin (Jardiance)  Epinephrine (EpiPen)  Escitalopram (Lexapro)  Gabapentin (Neurontin)  Hydrochlorothiazide (Hydrodiuril)  Insulin Tyler Aas)  Loratadine (Claritin)  Lubiprostone (Amitiza)  Metformin (Glucophage)  Methocarbamol (Robaxin)  Mometasone (Nasonex)  Naloxone (Narcan)  Olmesartan (Benicar)  Pantoprazole (Protonix)  Rosuvastatin (Crestor)  Sennosides (Senokot)  Supplement (Fiber)  Tamsulosin (Flomax)  Tirzepatide (Mounjaro) ==================================================================== For clinical consultation, please call [phone]. ====================================================================       ROS  Constitutional: Denies any fever or chills Gastrointestinal: No reported hemesis, hematochezia, vomiting, or acute GI distress Musculoskeletal: Denies any acute onset joint swelling, redness, loss of ROM, or weakness Neurological: No reported episodes of acute onset apraxia, aphasia, dysarthria, agnosia, amnesia, paralysis, loss of coordination, or loss of consciousness  Medication Review  ALPRAZolam, Benefiber, EPINEPHrine, FreeStyle Libre 14 Day Reader, FreeStyle Libre 14 Day Sensor, Insulin Pen Needle, Semaglutide (2 MG/DOSE), acetaminophen,  aspirin EC, buPROPion, carvedilol, desoximetasone, empagliflozin, escitalopram, gabapentin, glucose blood, hydrochlorothiazide, insulin degludec, insulin lispro, loratadine, lubiprostone, metFORMIN, methocarbamol, mometasone, morphine, naloxone, olmesartan, pantoprazole, rosuvastatin, sennosides-docusate sodium, tamsulosin, and tirzepatide  History Review  Allergy: Mr. Sine is allergic to contrast media [iodinated contrast media], iodine, and shellfish allergy. Drug: Mr. Mase  reports no history of drug use. Alcohol:  reports no history of alcohol use. Tobacco:  reports that he has quit smoking. His smoking use included cigarettes. He has never used smokeless tobacco. Social: Mr. Babauta  reports that he has quit smoking. His smoking use included cigarettes. He has never used smokeless tobacco. He reports that he does not drink alcohol and does not use drugs. Medical:  has a past medical history of Acute postoperative pain (11/24/2016), Anxiety, Chronic  hip pain (Right) (12/05/2014), Chronic lumbar pain, Depression, Diabetes mellitus without complication ([place]), Hyperlipidemia, Hypertension, Kidney stones, and Migraines. Surgical: Mr. Wiltgen  has a past surgical history that includes Total hip arthroplasty; Tonsillectomy; and right hip surgery. Family: family history includes Cancer in his mother; Diabetes in his father, sister, and sister; Heart disease in his father; Stroke in his father.  Laboratory Chemistry Profile   Renal Lab Results  Component Value Date   BUN 12 04/25/2021   CREATININE 1.02 Q000111Q   BCR NOT APPLICABLE Q000111Q   GFR 76.84 09/30/2020   GFRAA >60 03/26/2018   GFRNONAA >60 03/26/2018    Hepatic Lab Results  Component Value Date   AST 18 06/17/2021   ALT 26 06/17/2021   ALBUMIN 3.9 06/17/2021   ALKPHOS 71 06/17/2021    Electrolytes Lab Results  Component Value Date   NA 140 04/25/2021   K 4.0 04/25/2021   CL 101 04/25/2021   CALCIUM 9.7  04/25/2021   MG 1.8 03/06/2015    Bone No results found for: "VD25OH", "VD125OH2TOT", "[id number]", "[id number]", "25OHVITD1", "25OHVITD2", "[id number]", "TESTOFREE", "TESTOSTERONE"  Inflammation (CRP: Acute Phase) (ESR: Chronic Phase) Lab Results  Component Value Date   CRP 0.5 03/06/2015   ESRSEDRATE 45 (H) 12/14/2016         Note: Above Lab results reviewed.  Recent Imaging Review  DG Ankle Complete Left Please see detailed radiograph report in office note. Note: Reviewed        Physical Exam  General appearance: Well nourished, well developed, and well hydrated. In no apparent acute distress Mental status: Alert, oriented x 3 (person, place, & time)       Respiratory: No evidence of acute respiratory distress Eyes: PERLA Vitals: BP 126/69   Pulse 87   Temp (!) 97 F (36.1 C)   Ht 6' 1"$  (1.854 m)   Wt 250 lb (113.4 kg)   SpO2 96%   BMI 32.98 kg/m  BMI: Estimated body mass index is 32.98 kg/m as calculated from the following:   Height as of this encounter: 6' 1"$  (1.854 m).   Weight as of this encounter: 250 lb (113.4 kg). Ideal: Ideal body weight: 79.9 kg (176 lb 2.4 oz) Adjusted ideal body weight: 93.3 kg (205 lb 11 oz)  Assessment   Diagnosis Status  1. Acute exacerbation of chronic low back pain   2. Chronic low back pain (1ry area of Pain) (Bilateral) w/o sciatica   3. Lumbar facet syndrome (Bilateral) (R>L)   4. Lumbar facet joint osteoarthritis (Bilateral)   5. DDD (degenerative disc disease), lumbar   6. Chronic lower extremity pain (2ry area of Pain) (Bilateral) (R>L)   7. 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 Chronic knee pain (Bilateral)   9. Chronic neck pain (midline over the C7 spinous processes) (L>R)   10. DDD (degenerative disc disease), cervical   11. Chronic pain syndrome   12. Pharmacologic therapy   13. Chronic use of opiate for therapeutic purpose   14. Encounter for medication management   15. Encounter for  chronic pain management   16. Balance problem    Controlled Controlled Controlled   Updated Problems: No problems updated.   Plan of Care  Problem-specific:  No problem-specific Assessment & Plan notes found for this encounter.  Mr. HEMAL SPADA has a current medication list which includes the following long-term medication(s): bupropion, carvedilol, escitalopram, gabapentin, hydrochlorothiazide, insulin lispro, loratadine, lubiprostone, metformin, mometasone, [START ON 04/06/2022] morphine, [  START ON 05/06/2022] morphine, [START ON 06/05/2022] morphine, olmesartan, pantoprazole, rosuvastatin, and benefiber.  Pharmacotherapy (Medications Ordered): Meds ordered this encounter  Medications   morphine (MS CONTIN) 30 MG 12 hr tablet    Sig: Take 1 tablet (30 mg total) by mouth every 12 (twelve) hours. Must last 30 days. Do not break tablet    Dispense:  60 tablet    Refill:  0    DO NOT: delete (not duplicate); no partial-fill (will deny script to complete), no refill request (F/U required). DISPENSE: 1 day early if closed on fill date. WARN: No CNS-depressants within 8 hrs of med.   morphine (MS CONTIN) 30 MG 12 hr tablet    Sig: Take 1 tablet (30 mg total) by mouth every 12 (twelve) hours. Must last 30 days. Do not break tablet    Dispense:  60 tablet    Refill:  0    DO NOT: delete (not duplicate); no partial-fill (will deny script to complete), no refill request (F/U required). DISPENSE: 1 day early if closed on fill date. WARN: No CNS-depressants within 8 hrs of med.   morphine (MS CONTIN) 30 MG 12 hr tablet    Sig: Take 1 tablet (30 mg total) by mouth every 12 (twelve) hours. Must last 30 days. Do not break tablet    Dispense:  60 tablet    Refill:  0    DO NOT: delete (not duplicate); no partial-fill (will deny script to complete), no refill request (F/U required). DISPENSE: 1 day early if closed on fill date. WARN: No CNS-depressants within 8 hrs of med.   Orders:  Orders Placed  This Encounter  Procedures   LUMBAR FACET(MEDIAL BRANCH NERVE BLOCK) MBNB    Standing Status:   Future    Standing Expiration Date:   06/23/2022    Scheduling Instructions:     Procedure: Lumbar facet block (AKA.: Lumbosacral medial branch nerve block)     Side: Bilateral     Level: L3-4, L4-5, and L5-S1 Facets (L2, L3, L4, L5, and S1 Medial Branch)     Sedation: With Sedation.     Timeframe: ASAP    Order Specific Question:   Where will this procedure be performed?    Answer:   [facility]   Ambulatory referral to Physical Therapy    Referral Priority:   Routine    Referral Type:   Physical Medicine    Referral Reason:   Specialty Services Required    Requested Specialty:   Physical Therapy    Number of Visits Requested:   1   Follow-up plan:   Return for (ECT): (B) L-FCT Blk.      Interventional Therapies  Risk Factors  Considerations:   Allergy: CONTRAST, IODINE, & shellfish  VASOVAGAL RXN BNZ Tx       NO MORE RFA - intraoperative noncompliance, unnecessarily increasing radiation exposure NOTE: NO MORE RFA procedures. Difficulty following intra-procedural instructions and commands, resulting in unnecessarily long exposure of staff to radiation.    Planned  Pending:   Therapeutic bilateral lumbar facet MBB #4    Under consideration:   Diagnostic Left GONB   Completed:   Therapeutic bilateral lumbar facet MBB x3 (12/16/21) (100/100/80/80)  Therapeutic left lumbar facet RFA x2 (05/09/2019) (90/90/77/>75)  Therapeutic right lumbar facet RFA x6 (12/14/2019) (100/100/70/70)  Diagnostic/therapeutic right small joint inj. (thumb) x1 (03/15/2018) (N/A)  Diagnostic/therapeutic left cervical ESI x1 (12/18/2015) (90/70/50/50)    Completed by other providers:   None at this  time   Therapeutic  Palliative (PRN) options:   Palliative lumbar facet MBB  Therapeutic cervical ESI    Pharmacotherapy  Nonopioids transferred 12/14/2019: Gabapentin, Amitiza, Benefiber,  Mobic       Recent Visits Date Type Provider Dept  03/25/22 Office Visit Milinda Pointer, MD [facility]  12/31/21 Office Visit Milinda Pointer, MD [facility]  12/30/21 Office Visit Milinda Pointer, MD [facility]  Showing recent visits within past 90 days and meeting all other requirements Future Appointments No visits were found meeting these conditions. Showing future appointments within next 90 days and meeting all other requirements  I discussed the assessment and treatment plan with the patient. The patient was provided an opportunity to ask questions and all were answered. The patient agreed with the plan and demonstrated an understanding of the instructions.  Patient advised to call back or seek an in-person evaluation if the symptoms or condition worsens.  Duration of encounter: 30 minutes.  Total time on encounter, as per AMA guidelines included both the face-to-face and non-face-to-face time personally spent by the physician and/or other qualified health care professional(s) on the day of the encounter (includes time in activities that require the physician or other qualified health care professional and does not include time in activities normally performed by clinical staff). Physician's time may include the following activities when performed: Preparing to see the patient (e.g., pre-charting review of records, searching for previously ordered imaging, lab work, and nerve conduction tests) Review of prior analgesic pharmacotherapies. Reviewing PMP Interpreting ordered tests (e.g., lab work, imaging, nerve conduction tests) Performing post-procedure evaluations, including interpretation of diagnostic procedures Obtaining and/or reviewing separately obtained history Performing a medically appropriate examination and/or evaluation Counseling and educating the patient/family/caregiver Ordering medications, tests, or procedures Referring  and communicating with other health care professionals (when not separately reported) Documenting clinical information in the electronic or other health record Independently interpreting results (not separately reported) and communicating results to the patient/ family/caregiver Care coordination (not separately reported)  Note by: Gaspar Cola, MD Date: 03/25/2022; Time: 7:34 AM

## 2022-03-25 NOTE — Patient Instructions (Addendum)
______________________________________________________________________  Procedure instructions  Do not eat or drink fluids (other than water) for 6 hours before your procedure  No water for 2 hours before your procedure  Take your blood pressure medicine with a sip of water  Arrive 30 minutes before your appointment  Carefully read the "Preparing for your procedure" detailed instructions  If you have questions call us at [phone]  _____________________________________________________________________    ______________________________________________________________________  Preparing for your procedure  During your procedure appointment there will be: No Prescription Refills. No disability issues to discussed. No medication changes or discussions.  Instructions: Food intake: Avoid eating anything solid for at least 8 hours prior to your procedure. Clear liquid intake: You may take clear liquids such as water up to 2 hours prior to your procedure. (No carbonated drinks. No soda.) Transportation: Unless otherwise stated by your physician, bring a driver. Morning Medicines: Except for blood thinners, take all of your other morning medications with a sip of water. Make sure to take your heart and blood pressure medicines. If your blood pressure's lower number is above 100, the case will be rescheduled. Blood thinners: Make sure to stop your blood thinners as instructed.  If you take a blood thinner, but were not instructed to stop it, call our office [phone] and ask to talk to a nurse. Not stopping a blood thinner prior to certain procedures could lead to serious complications. Diabetics on insulin: Notify the staff so that you can be scheduled 1st case in the morning. If your diabetes requires high dose insulin, take only  of your normal insulin dose the morning of the procedure and notify the staff that you have done so. Preventing infections: Shower with an  antibacterial soap the morning of your procedure.  Build-up your immune system: Take 1000 mg of Vitamin C with every meal (3 times a day) the day prior to your procedure. Antibiotics: Inform the nursing staff if you are taking any antibiotics or if you have any conditions that may require antibiotics prior to procedures. (Example: recent joint implants)   Pregnancy: If you are pregnant make sure to notify the nursing staff. Not doing so may result in injury to the fetus, including death.  Sickness: If you have a cold, fever, or any active infections, call and cancel or reschedule your procedure. Receiving steroids while having an infection may result in complications. Arrival: You must be in the facility at least 30 minutes prior to your scheduled procedure. Tardiness: Your scheduled time is also the cutoff time. If you do not arrive at least 15 minutes prior to your procedure, you will be rescheduled.  Children: Do not bring any children with you. Make arrangements to keep them home. Dress appropriately: There is always a possibility that your clothing may get soiled. Avoid long dresses. Valuables: Do not bring any jewelry or valuables.  Reasons to call and reschedule or cancel your procedure: (Following these recommendations will minimize the risk of a serious complication.) Surgeries: Avoid having procedures within 2 weeks of any surgery. (Avoid for 2 weeks before or after any surgery). Flu Shots: Avoid having procedures within 2 weeks of a flu shots or . (Avoid for 2 weeks before or after immunizations). Barium: Avoid having a procedure within 7-10 days after having had a radiological study involving the use of radiological contrast. (Myelograms, Barium swallow or enema study). Heart attacks: Avoid any elective procedures or surgeries for the initial 6 months after a "Myocardial Infarction" (Heart Attack). Blood thinners: It  is imperative that you stop these medications before procedures. Let us  know if you if you take any blood thinner.  Infection: Avoid procedures during or within two weeks of an infection (including chest colds or gastrointestinal problems). Symptoms associated with infections include: Localized redness, fever, chills, night sweats or profuse sweating, burning sensation when voiding, cough, congestion, stuffiness, runny nose, sore throat, diarrhea, nausea, vomiting, cold or Flu symptoms, recent or current infections. It is specially important if the infection is over the area that we intend to treat. Heart and lung problems: Symptoms that may suggest an active cardiopulmonary problem include: cough, chest pain, breathing difficulties or shortness of breath, dizziness, ankle swelling, uncontrolled high or unusually low blood pressure, and/or palpitations. If you are experiencing any of these symptoms, cancel your procedure and contact your primary care physician for an evaluation.  Remember:  Regular Business hours are:  Monday to Thursday 8:00 AM to 4:00 PM  Provider's Schedule: Milinda Pointer, MD:  Procedure days: Tuesday and Thursday 7:30 AM to 4:00 PM  Gillis Santa, MD:  Procedure days: Monday and Wednesday 7:30 AM to 4:00 PM  ______________________________________________________________________    ____________________________________________________________________________________________  General Risks and Possible Complications  Patient Responsibilities: It is important that you read this as it is part of your informed consent. It is our duty to inform you of the risks and possible complications associated with treatments offered to you. It is your responsibility as a patient to read this and to ask questions about anything that is not clear or that you believe was not covered in this document.  Patient's Rights: You have the right to refuse treatment. You also have the right to change your mind, even after initially having agreed to have the treatment  done. However, under this last option, if you wait until the last second to change your mind, you may be charged for the materials used up to that point.  Introduction: Medicine is not an Chief Strategy Officer. Everything in Medicine, including the lack of treatment(s), carries the potential for danger, harm, or loss (which is by definition: Risk). In Medicine, a complication is a secondary problem, condition, or disease that can aggravate an already existing one. All treatments carry the risk of possible complications. The fact that a side effects or complications occurs, does not imply that the treatment was conducted incorrectly. It must be clearly understood that these can happen even when everything is done following the highest safety standards.  No treatment: You can choose not to proceed with the proposed treatment alternative. The "PRO(s)" would include: avoiding the risk of complications associated with the therapy. The "CON(s)" would include: not getting any of the treatment benefits. These benefits fall under one of three categories: diagnostic; therapeutic; and/or palliative. Diagnostic benefits include: getting information which can ultimately lead to improvement of the disease or symptom(s). Therapeutic benefits are those associated with the successful treatment of the disease. Finally, palliative benefits are those related to the decrease of the primary symptoms, without necessarily curing the condition (example: decreasing the pain from a flare-up of a chronic condition, such as incurable terminal cancer).  General Risks and Complications: These are associated to most interventional treatments. They can occur alone, or in combination. They fall under one of the following six (6) categories: no benefit or worsening of symptoms; bleeding; infection; nerve damage; allergic reactions; and/or death. No benefits or worsening of symptoms: In Medicine there are no guarantees, only probabilities. No  healthcare provider can ever guarantee that a  medical treatment will work, they can only state the probability that it may. Furthermore, there is always the possibility that the condition may worsen, either directly, or indirectly, as a consequence of the treatment. Bleeding: This is more common if the patient is taking a blood thinner, either prescription or over the counter (example: Goody Powders, Fish oil, Aspirin, Garlic, etc.), or if suffering a condition associated with impaired coagulation (example: Hemophilia, cirrhosis of the liver, low platelet counts, etc.). However, even if you do not have one on these, it can still happen. If you have any of these conditions, or take one of these drugs, make sure to notify your treating physician. Infection: This is more common in patients with a compromised immune system, either due to disease (example: diabetes, cancer, human immunodeficiency virus [HIV], etc.), or due to medications or treatments (example: therapies used to treat cancer and rheumatological diseases). However, even if you do not have one on these, it can still happen. If you have any of these conditions, or take one of these drugs, make sure to notify your treating physician. Nerve Damage: This is more common when the treatment is an invasive one, but it can also happen with the use of medications, such as those used in the treatment of cancer. The damage can occur to small secondary nerves, or to large primary ones, such as those in the spinal cord and brain. This damage may be temporary or permanent and it may lead to impairments that can range from temporary numbness to permanent paralysis and/or brain death. Allergic Reactions: Any time a substance or material comes in contact with our body, there is the possibility of an allergic reaction. These can range from a mild skin rash (contact dermatitis) to a severe systemic reaction (anaphylactic reaction), which can result in death. Death: In  general, any medical intervention can result in death, most of the time due to an unforeseen complication. ____________________________________________________________________________________________    ____________________________________________________________________________________________  Patient Information update  To: All of our patients.  Re: Name change.  It has been made official that our current name, "[facility]"   will soon be changed to "[facility]".   The purpose of this change is to eliminate any confusion created by the concept of our practice being a "Medication Management Pain [facility]". In the past this has led to the misconception that we treat pain primarily by the use of prescription medications.  Nothing can be farther from the truth.   Understanding PAIN MANAGEMENT: To further understand what our practice does, you first have to understand that "Pain Management" is a subspecialty that requires additional training once a physician has completed their specialty training, which can be in either Anesthesia, Neurology, Psychiatry, or Physical Medicine and Rehabilitation (PMR). Each one of these contributes to the final approach taken by each physician to the management of their patient's pain. To be a "Pain Management Specialist" you must have first completed one of the specialty trainings below.  Anesthesiologists - trained in clinical pharmacology and interventional techniques such as nerve blockade and regional as well as central neuroanatomy. They are trained to block pain before, during, and after surgical interventions.  Neurologists - trained in the diagnosis and pharmacological treatment of complex neurological conditions, such as Multiple Sclerosis, Parkinson's, spinal cord injuries, and other systemic conditions that may be associated with symptoms that may  include but are not limited to pain. They tend to rely  primarily on the treatment of chronic pain using prescription medications.  Psychiatrist - trained in conditions affecting the psychosocial wellbeing of patients including but not limited to depression, anxiety, schizophrenia, personality disorders, addiction, and other substance use disorders that may be associated with chronic pain. They tend to rely primarily on the treatment of chronic pain using prescription medications.   Physical Medicine and Rehabilitation (PMR) physicians, also known as physiatrists - trained to treat a wide variety of medical conditions affecting the brain, spinal cord, nerves, bones, joints, ligaments, muscles, and tendons. Their training is primarily aimed at treating patients that have suffered injuries that have caused severe physical impairment. Their training is primarily aimed at the physical therapy and rehabilitation of those patients. They may also work alongside orthopedic surgeons or neurosurgeons using their expertise in assisting surgical patients to recover after their surgeries.  INTERVENTIONAL PAIN MANAGEMENT is sub-subspecialty of Pain Management.  Our physicians are Board-certified in Anesthesia, Pain Management, and Interventional Pain Management.  This meaning that not only have they been trained and Board-certified in their specialty of Anesthesia, and subspecialty of Pain Management, but they have also received further training in the sub-subspecialty of Interventional Pain Management, in order to become Board-certified as INTERVENTIONAL PAIN MANAGEMENT SPECIALIST.    Mission: Our goal is to use our skills in  [place] as alternatives to the chronic use of prescription opioid medications for the treatment of pain. To make this more clear, we have changed our name to reflect what we do and offer. We will continue to offer medication management assessment and recommendations, but we  will not be taking over any patient's medication management.  ____________________________________________________________________________________________     ____________________________________________________________________________________________  Opioid Pain Medication Update  To: All patients taking opioid pain medications. (I.e.: hydrocodone, hydromorphone, oxycodone, oxymorphone, morphine, codeine, methadone, tapentadol, tramadol, buprenorphine, fentanyl, etc.)  Re: Updated review of side effects and adverse reactions of opioid analgesics, as well as new information about long term effects of this class of medications.  Direct risks of long-term opioid therapy are not limited to opioid addiction and overdose. Potential medical risks include serious fractures, breathing problems during sleep, hyperalgesia, immunosuppression, chronic constipation, bowel obstruction, myocardial infarction, and tooth decay secondary to xerostomia.  Unpredictable adverse effects that can occur even if you take your medication correctly: Cognitive impairment, respiratory depression, and death. Most people think that if they take their medication "correctly", and "as instructed", that they will be safe. Nothing could be farther from the truth. In reality, a significant amount of recorded deaths associated with the use of opioids has occurred in individuals that had taken the medication for a long time, and were taking their medication correctly. The following are examples of how this can happen: Patient taking his/her medication for a long time, as instructed, without any side effects, is given a certain antibiotic or another unrelated medication, which in turn triggers a "Drug-to-drug interaction" leading to disorientation, cognitive impairment, impaired reflexes, respiratory depression or an untoward event leading to serious bodily harm or injury, including death.  Patient taking his/her medication for a long  time, as instructed, without any side effects, develops an acute impairment of liver and/or kidney function. This will lead to a rapid inability of the body to breakdown and eliminate their pain medication, which will result in effects similar to an "overdose", but with the same medicine and dose that they had always taken. This again may lead to disorientation, cognitive impairment, impaired reflexes, respiratory depression or an untoward  event leading to serious bodily harm or injury, including death.  A similar problem will occur with patients as they grow older and their liver and kidney function begins to decrease as part of the aging process.  Background information: Historically, the original case for using long-term opioid therapy to treat chronic noncancer pain was based on safety assumptions that subsequent experience has called into question. In 1996, the American Pain Society and the [place] [employer] issued a consensus statement supporting long-term opioid therapy. This statement acknowledged the dangers of opioid prescribing but concluded that the risk for addiction was low; respiratory depression induced by opioids was short-lived, occurred mainly in opioid-naive patients, and was antagonized by pain; tolerance was not a common problem; and efforts to control diversion should not constrain opioid prescribing. This has now proven to be wrong. Experience regarding the risks for opioid addiction, misuse, and overdose in community practice has failed to support these assumptions.  According to the Centers for Disease Control and Prevention, fatal overdoses involving opioid analgesics have increased sharply over the past decade. Currently, more than 96,700 people die from drug overdoses every year. Opioids are a factor in 7 out of every 10 overdose deaths. Deaths from drug overdose have surpassed motor vehicle accidents as the leading cause of death for individuals between the ages of  13 and 5.  Clinical data suggest that neuroendocrine dysfunction may be very common in both men and women, potentially causing hypogonadism, erectile dysfunction, infertility, decreased libido, osteoporosis, and depression. Recent studies linked higher opioid dose to increased opioid-related mortality. Controlled observational studies reported that long-term opioid therapy may be associated with increased risk for cardiovascular events. Subsequent meta-analysis concluded that the safety of long-term opioid therapy in elderly patients has not been proven.   Side Effects and adverse reactions: Common side effects: Drowsiness (sedation). Dizziness. Nausea and vomiting. Constipation. Physical dependence -- Dependence often manifests with withdrawal symptoms when opioids are discontinued or decreased. Tolerance -- As you take repeated doses of opioids, you require increased medication to experience the same effect of pain relief. Respiratory depression -- This can occur in healthy people, especially with higher doses. However, people with COPD, asthma or other lung conditions may be even more susceptible to fatal respiratory impairment.  Uncommon side effects: An increased sensitivity to feeling pain and extreme response to pain (hyperalgesia). Chronic use of opioids can lead to this. Delayed gastric emptying (the process by which the contents of your stomach are moved into your small intestine). Muscle rigidity. Immune system and hormonal dysfunction. Quick, involuntary muscle jerks (myoclonus). Arrhythmia. Itchy skin (pruritus). Dry mouth (xerostomia).  Long-term side effects: Chronic constipation. Sleep-disordered breathing (SDB). Increased risk of bone fractures. Hypothalamic-pituitary-adrenal dysregulation. Increased risk of overdose.  RISKS: Fractures and Falls:  Opioids increase the risk and incidence of falls. This is of particular importance in elderly patients.  Endocrine  System:  Long-term administration is associated with endocrine abnormalities. Influences on both the hypothalamic-pituitary-adrenal axis?and the hypothalamic-pituitary-gonadal axis have been demonstrated with consequent hypogonadism and adrenal insufficiency in both sexes. Hypogonadism and decreased levels of dehydroepiandrosterone sulfate have been reported in men and women. Endocrine effects can lead to: Amenorrhoea in women Reduced libido in both sexes Erectile dysfunction in men Infertility Depression and fatigue Patients (particularly women of childbearing age) should avoid opioids. There is insufficient evidence to recommend routine monitoring of asymptomatic patients taking opioids in the long-term for hormonal deficiencies.  Immune System: Human studies have demonstrated that opioids have an immunomodulating effect.  These effects are mediated via opioid receptors both on immune effector cells and in the central nervous system. Opioids have been demonstrated to have adverse effects on antimicrobial response and anti-tumour surveillance. Buprenorphine has been demonstrated to have no impact on immune function.  Opioid Induced Hyperalgesia: Human studies have demonstrated that prolonged use of opioids can lead to a state of abnormal pain sensitivity, sometimes called opioid induced hyperalgesia (OIH). Opioid induced hyperalgesia is not usually seen in the absence of tolerance to opioid analgesia. Clinically, hyperalgesia may be diagnosed if the patient on long-term opioid therapy presents with increased pain. This might be qualitatively and anatomically distinct from pain related to disease progression or to breakthrough pain resulting from development of opioid tolerance. Pain associated with hyperalgesia tends to be more diffuse than the pre-existing pain and less defined in quality. Management of opioid induced hyperalgesia requires opioid dose reduction.  Cancer: Chronic opioid  therapy has been associated with an increased risk of cancer among noncancer patients with chronic pain. This association was more evident in chronic strong opioid users. Chronic opioid consumption causes significant pathological changes in the small intestine and colon. Epidemiological studies have found that there is a link between opium dependence and initiation of gastrointestinal cancers. Cancer is the second leading cause of death after cardiovascular disease. Chronic use of opioids can cause multiple conditions such as GERD, immunosuppression and renal damage as well as carcinogenic effects, which are associated with the incidence of cancers.   Mortality: Long-term opioid use has been associated with increased mortality among patients with chronic non-cancer pain (CNCP).  Prescription of long-acting opioids for chronic noncancer pain was associated with a significantly increased risk of all-cause mortality, including deaths from causes other than overdose.  Reference: Von Korff M, Kolodny A, Deyo RA, Chou R. Long-term opioid therapy reconsidered. Ann Intern Med. 2011 Sep 6;155(5):325-8. doi: 10.7326/[id number]. PMID: [id number]; PMCIDLD:501236. Morley Kos, Hayward RA, Dunn KM, [place] KP. Risk of adverse events in patients prescribed long-term opioids: A cohort study in the [place] [facility] [employer]. Eur J Pain. 2019 May;23(5):908-922. doi: 10.1002/ejp.1357. Epub 2019 Jan 31. PMID: [id number]. Colameco S, Coren JS, Ciervo CA. Continuous opioid treatment for chronic noncancer pain: a time for moderation in prescribing. Postgrad Med. 2009 Jul;121(4):61-6. doi: 10.3810/pgm.2009.07.2032. PMID: [id number]. Heywood Bene RN, Ebro SD, Blazina I, Rosalio Loud, Bougatsos C, Deyo RA. The effectiveness and risks of long-term opioid therapy for chronic pain: a systematic review for a [employer] [facility] [employer] to [employer]. Ann Intern Med. 2015 Feb 17;162(4):276-86. doi: [id number]. PMID: [id number]. [facility] S[facility], Makuc DM. NCHS Data Brief No. 22. [place]: Centers for Disease Control and Prevention; 2009. Sep, Increase in Fatal Poisonings Involving Opioid Analgesics in the [place], 1999-2006. Song IA, Choi HR, Oh TK. Long-term opioid use and mortality in patients with chronic non-cancer pain: Ten-year follow-up study in [place] from 2010 through 2019. EClinicalMedicine. 2022 Jul 18;51:101558. doi: [id number]/j.[id number]. PMID: [id number]; PMCIDYT:[id number]. Huser, W., Schubert, T., Vogelmann, T. et al. All-cause mortality in patients with long-term opioid therapy compared with non-opioid analgesics for chronic non-cancer pain: a database study. Ramos Med 18, 162 (2020). [email or web address] Rashidian H, Roxy Cedar, Malekzadeh R, Haghdoost AA. An Ecological Study of the Association between Opiate Use and Incidence of Cancers. Addict Health. 2016 Fall;8(4):252-260. PMID: [id number]; PMCIDYE:[id number].  Our Goal: Our goal is to control your pain with means other than the use of opioid pain  medications.  Our Recommendation: Talk to your physician about coming off of these medications. We can assist you with the tapering down and stopping these medicines. Based on the new information, even if you cannot completely stop the medication, a decrease in the dose may be associated with a lesser risk. Ask for other means of controlling the pain. Decrease or eliminate those factors that significantly contribute to your pain such as smoking, obesity, and a diet heavily tilted towards "inflammatory" nutrients.  ____________________________________________________________________________________________     ____________________________________________________________________________________________  Carron Brazen Pain Medication Shortage  The [place] is experiencing  worsening drug shortages. These have had a negative widespread effect on patient care and treatment. Not expected to improve any time soon. Predicted to last past 2029.   Drug shortage list (generic names) Oxycodone IR Oxycodone/APAP Oxymorphone IR Hydromorphone Hydrocodone/APAP Morphine  Where is the problem?  Manufacturing and supply level.  Will this shortage affect you?  Only if you take any of the above pain medications.  How? You may be unable to fill your prescription.  Your pharmacist may offer a "partial fill" of your prescription. (Warning: Do not accept partial fills.) Prescriptions partially filled cannot be transferred to another pharmacy. Read our Medication Rules and Regulation. Depending on how much medicine you are dependent on, you may experience withdrawals when unable to get the medication.  Recommendations: Consider ending your dependence on opioid pain medications. Ask your pain specialist to assist you with the process. Consider switching to a medication currently not in shortage, such as Buprenorphine. Talk to your pain specialist about this option. Consider decreasing your pain medication requirements by managing tolerance thru "Drug Holidays". This may help minimize withdrawals, should you run out of medicine. Control your pain thru the use of non-pharmacological interventional therapies.   Your prescriber: Prescribers cannot be blamed for shortages. Medication manufacturing and supply issues cannot be fixed by the prescriber.   NOTE: The prescriber is not responsible for supplying the medication, or solving supply issues. Work with your pharmacist to solve it. The patient is responsible for the decision to take or continue taking the medication and for identifying and securing a legal supply source. By law, supplying the medication is the job and responsibility of the pharmacy. The prescriber is responsible for the evaluation, monitoring, and prescribing of  these medications.   Prescribers will NOT: Re-issue prescriptions that have been partially filled. Re-issue prescriptions already sent to a pharmacy.  Re-send prescriptions to a different pharmacy because yours did not have your medication. Ask pharmacist to order more medicine or transfer the prescription to another pharmacy. (Read below.)  New 2023 regulation: "October 03, 2021 Revised Regulation Allows DEA-Registered Pharmacies to Transfer Electronic Prescriptions at a Patient's Request [place] Patients now have the ability to request their electronic prescription be transferred to another pharmacy without having to go back to their practitioner to initiate the request. This revised regulation went into effect on Monday, September 29, 2021.     At a patient's request, a DEA-registered retail pharmacy can now transfer an electronic prescription for a controlled substance (schedules II-V) to another DEA-registered retail pharmacy. Prior to this change, patients would have to go through their practitioner to cancel their prescription and have it re-issued to a different pharmacy. The process was taxing and time consuming for both patients and practitioners.    The Drug Enforcement Administration [facility]) published its intent to revise the process for transferring electronic prescriptions on December 22, 2019.  The  final rule was published in the federal register on August 28, 2021 and went into effect 30 days later.  Under the final rule, a prescription can only be transferred once between pharmacies, and only if allowed under existing state or other applicable law. The prescription must remain in its electronic form; may not be altered in any way; and the transfer must be communicated directly between two licensed pharmacists. It's important to note, any authorized refills transfer with the original prescription, which means the entire prescription will be  filled at the same pharmacy".  Reference: [employer] [facility] website announcement)  [email or web address] ([place])   [employer] / Vol. 88, No. 143 / Thursday, August 28, 2021 / Rules and Regulations DEPARTMENT OF JUSTICE  Drug Enforcement Administration  21 CFR Part 1306  [Docket No. [id number]]  RIN [id number] Transfer of Electronic Prescriptions for Schedules II-V Controlled Substances Between Pharmacies for Initial Filling  ____________________________________________________________________________________________     _______________________________________________________________________  Medication Rules  Purpose: To inform patients, and their family members, of our medication rules and regulations.  Applies to: All patients receiving prescriptions from our practice (written or electronic).  Pharmacy of record: This is the pharmacy where your electronic prescriptions will be sent. Make sure we have the correct one.  Electronic prescriptions: In compliance with the [place] (STOP) Act of 2017 (Session Lanny Cramp [id number]), effective February 02, 2018, all controlled substances must be electronically prescribed. Written prescriptions, faxing, or calling prescriptions to a pharmacy will no longer be done.  Prescription refills: These will be provided only during in-person appointments. No medications will be renewed without a "face-to-face" evaluation with your provider. Applies to all prescriptions.  NOTE: The following applies primarily to controlled substances (Opioid* Pain Medications).   Type of encounter (visit): For patients receiving controlled substances, face-to-face visits are required. (Not an option and not up to the patient.)  Patient's  responsibilities: Pain Pills: Bring all pain pills to every appointment (except for procedure appointments). Pill Bottles: Bring pills in original pharmacy bottle. Bring bottle, even if empty. Always bring the bottle of the most recent fill.  Medication refills: You are responsible for knowing and keeping track of what medications you are taking and when is it that you will need a refill. The day before your appointment: write a list of all prescriptions that need to be refilled. The day of the appointment: give the list to the admitting nurse. Prescriptions will be written only during appointments. No prescriptions will be written on procedure days. If you forget a medication: it will not be "Called in", "Faxed", or "electronically sent". You will need to get another appointment to get these prescribed. No early refills. Do not call asking to have your prescription filled early. Partial  or short prescriptions: Occasionally your pharmacy may not have enough pills to fill your prescription.  NEVER ACCEPT a partial fill or a prescription that is short of the total amount of pills that you were prescribed.  With controlled substances the law allows 72 hours for the pharmacy to complete the prescription.  If the prescription is not completed within 72 hours, the pharmacist will require a new prescription to be written. This means that you will be short on your medicine and we WILL NOT send another prescription to complete your original prescription.  Instead, request the pharmacy to send a carrier to a nearby branch to get enough medication to provide you with your full prescription. Prescription Accuracy: You  are responsible for carefully inspecting your prescriptions before leaving our office. Have the discharge nurse carefully go over each prescription with you, before taking them home. Make sure that your name is accurately spelled, that your address is correct. Check the name and dose of your medication  to make sure it is accurate. Check the number of pills, and the written instructions to make sure they are clear and accurate. Make sure that you are given enough medication to last until your next medication refill appointment. Taking Medication: Take medication as prescribed. When it comes to controlled substances, taking less pills or less frequently than prescribed is permitted and encouraged. Never take more pills than instructed. Never take the medication more frequently than prescribed.  Inform other Doctors: Always inform, all of your healthcare providers, of all the medications you take. Pain Medication from other Providers: You are not allowed to accept any additional pain medication from any other Doctor or Healthcare provider. There are two exceptions to this rule. (see below) In the event that you require additional pain medication, you are responsible for notifying us, as stated below. Cough Medicine: Often these contain an opioid, such as codeine or hydrocodone. Never accept or take cough medicine containing these opioids if you are already taking an opioid* medication. The combination may cause respiratory failure and death. Medication Agreement: You are responsible for carefully reading and following our Medication Agreement. This must be signed before receiving any prescriptions from our practice. Safely store a copy of your signed Agreement. Violations to the Agreement will result in no further prescriptions. (Additional copies of our Medication Agreement are available upon request.) Laws, Rules, & Regulations: All patients are expected to follow all Federal and [employer], [employer], Rules, [employer]. Ignorance of the Laws does not constitute a valid excuse.  Illegal drugs and Controlled Substances: The use of illegal substances (including, but not limited to marijuana and its derivatives) and/or the illegal use of any controlled substances is strictly prohibited. Violation of this  rule may result in the immediate and permanent discontinuation of any and all prescriptions being written by our practice. The use of any illegal substances is prohibited. Adopted CDC guidelines & recommendations: Target dosing levels will be at or below 60 MME/day. Use of benzodiazepines** is not recommended.  Exceptions: There are only two exceptions to the rule of not receiving pain medications from other Healthcare Providers. Exception #1 (Emergencies): In the event of an emergency (i.e.: accident requiring emergency care), you are allowed to receive additional pain medication. However, you are responsible for: As soon as you are able, call our office [phone], at any time of the day or night, and leave a message stating your name, the date and nature of the emergency, and the name and dose of the medication prescribed. In the event that your call is answered by a member of our staff, make sure to document and save the date, time, and the name of the person that took your information.  Exception #2 (Planned Surgery): In the event that you are scheduled by another doctor or dentist to have any type of surgery or procedure, you are allowed (for a period no longer than 30 days), to receive additional pain medication, for the acute post-op pain. However, in this case, you are responsible for picking up a copy of our "Post-op Pain Management for Surgeons" handout, and giving it to your surgeon or dentist. This document is available at our office, and does not require an appointment  to obtain it. Simply go to our office during business hours (Monday-Thursday from 8:00 AM to 4:00 PM) (Friday 8:00 AM to 12:00 Noon) or if you have a scheduled appointment with [place], prior to your surgery, and ask for it by name. In addition, you are responsible for: calling our office [phone], at any time of the day or night, and leaving a message stating your name, name of your surgeon, type of surgery, and date of  procedure or surgery. Failure to comply with your responsibilities may result in termination of therapy involving the controlled substances. Medication Agreement Violation. Following the above rules, including your responsibilities will help you in avoiding a Medication Agreement Violation ("Breaking your Pain Medication Contract").  Consequences:  Not following the above rules may result in permanent discontinuation of medication prescription therapy.  *Opioid medications include: morphine, codeine, oxycodone, oxymorphone, hydrocodone, hydromorphone, meperidine, tramadol, tapentadol, buprenorphine, fentanyl, methadone. **Benzodiazepine medications include: diazepam (Valium), alprazolam (Xanax), clonazepam (Klonopine), lorazepam (Ativan), clorazepate (Tranxene), chlordiazepoxide (Librium), estazolam (Prosom), oxazepam (Serax), temazepam (Restoril), triazolam (Halcion) (Last updated: 11/25/2021) ______________________________________________________________________    ______________________________________________________________________  Medication Recommendations and Reminders  Applies to: All patients receiving prescriptions (written and/or electronic).  Medication Rules & Regulations: You are responsible for reading, knowing, and following our "Medication Rules" document. These exist for your safety and that of others. They are not flexible and neither are we. Dismissing or ignoring them is an act of "non-compliance" that may result in complete and irreversible termination of such medication therapy. For safety reasons, "non-compliance" will not be tolerated. As with the [place] fundamental legal principle of "ignorance of the law is no defense", we will accept no excuses for not having read and knowing the content of documents provided to you by our practice.  Pharmacy of record:  Definition: This is the pharmacy where your electronic prescriptions will be sent.  We do not endorse any  particular pharmacy. It is up to you and your insurance to decide what pharmacy to use.  We do not restrict you in your choice of pharmacy. However, once we write for your prescriptions, we will NOT be re-sending more prescriptions to fix restricted supply problems created by your pharmacy, or your insurance.  The pharmacy listed in the electronic medical record should be the one where you want electronic prescriptions to be sent. If you choose to change pharmacy, simply notify our nursing staff. Changes will be made only during your regular appointments and not over the phone.  Recommendations: Keep all of your pain medications in a safe place, under lock and key, even if you live alone. We will NOT replace lost, stolen, or damaged medication. We do not accept "Police Reports" as proof of medications having been stolen. After you fill your prescription, take 1 week's worth of pills and put them away in a safe place. You should keep a separate, properly labeled bottle for this purpose. The remainder should be kept in the original bottle. Use this as your primary supply, until it runs out. Once it's gone, then you know that you have 1 week's worth of medicine, and it is time to come in for a prescription refill. If you do this correctly, it is unlikely that you will ever run out of medicine. To make sure that the above recommendation works, it is very important that you make sure your medication refill appointments are scheduled at least 1 week before you run out of medicine. To do this in an effective manner, make sure that you  do not leave the office without scheduling your next medication management appointment. Always ask the nursing staff to show you in your prescription , when your medication will be running out. Then arrange for the receptionist to get you a return appointment, at least 7 days before you run out of medicine. Do not wait until you have 1 or 2 pills left, to come in. This is very poor  planning and does not take into consideration that we may need to cancel appointments due to bad weather, sickness, or emergencies affecting our staff. DO NOT ACCEPT A "Partial Fill": If for any reason your pharmacy does not have enough pills/tablets to completely fill or refill your prescription, do not allow for a "partial fill". The law allows the pharmacy to complete that prescription within 72 hours, without requiring a new prescription. If they do not fill the rest of your prescription within those 72 hours, you will need a separate prescription to fill the remaining amount, which we will NOT provide. If the reason for the partial fill is your insurance, you will need to talk to the pharmacist about payment alternatives for the remaining tablets, but again, DO NOT ACCEPT A PARTIAL FILL, unless you can trust your pharmacist to obtain the remainder of the pills within 72 hours.  Prescription refills and/or changes in medication(s):  Prescription refills, and/or changes in dose or medication, will be conducted only during scheduled medication management appointments. (Applies to both, written and electronic prescriptions.) No refills on procedure days. No medication will be changed or started on procedure days. No changes, adjustments, and/or refills will be conducted on a procedure day. Doing so will interfere with the diagnostic portion of the procedure. No phone refills. No medications will be "called into the pharmacy". No Fax refills. No weekend refills. No Holliday refills. No after hours refills.  Remember:  Business hours are:  Monday to Thursday 8:00 AM to 4:00 PM Provider's Schedule: Milinda Pointer, MD - Appointments are:  Medication management: Monday and Wednesday 8:00 AM to 4:00 PM Procedure day: Tuesday and Thursday 7:30 AM to 4:00 PM Gillis Santa, MD - Appointments are:  Medication management: Tuesday and Thursday 8:00 AM to 4:00 PM Procedure day: Monday and Wednesday 7:30  AM to 4:00 PM (Last update: 11/25/2021) ______________________________________________________________________    ____________________________________________________________________________________________  Drug Holidays  What is a "Drug Holiday"? Drug Holiday: is the name given to the process of slowly tapering down and temporarily stopping the pain medication for the purpose of decreasing or eliminating tolerance to the drug.  Benefits Improved effectiveness Decreased required effective dose Improved pain control End dependence on high dose therapy Decrease cost of therapy Uncovering "opioid-induced hyperalgesia". (OIH)  What is "opioid hyperalgesia"? It is a paradoxical increase in pain caused by exposure to opioids. Stopping the opioid pain medication, contrary to the expected, it actually decreases or completely eliminates the pain. Ref.: "A comprehensive review of opioid-induced hyperalgesia". Brion Aliment, et.al. Pain Physician. 2011 Mar-Apr;14(2):145-61.  What is tolerance? Tolerance: the progressive loss of effectiveness of a pain medicine due to repetitive use. A common problem of opioid pain medications.  How long should a "Drug Holiday" last? Effectiveness depends on the patient staying off all opioid pain medicines for a minimum of 14 consecutive days. (2 weeks)  How about just taking less of the medicine? Does not work. Will not accomplish goal of eliminating the excess receptors.  How about switching to a different pain medicine? (AKA. "Opioid rotation") Does not work. Creates the illusion  of effectiveness by taking advantage of inaccurate equivalent dose calculations between different opioids. -This "technique" was promoted by studies funded by [employer], such as [employer], creators of "OxyContin".  Can I stop the medicine "cold [place]"? Depends. You should always coordinate with your Pain Specialist to make the transition as smoothly as  possible. Avoid stopping the medicine abruptly without consulting. We recommend a "slow taper".  What is a slow taper? Taper: refers to the gradual decrease in dose.   How do I stop/taper the dose? Slowly. Decrease the daily amount of pills that you take by one (1) pill every seven (7) days. This is called a "slow downward taper". Example: if you normally take four (4) pills per day, drop it to three (3) pills per day for seven (7) days, then to two (2) pills per day for seven (7) days, then to one (1) per day for seven (7) days, and then stop the medicine. The 14 day "Drug Holiday" starts on the first day without medicine.   Will I experience withdrawals? Unlikely with a slow taper.  What triggers withdrawals? Withdrawals are triggered by the sudden/abrupt stop of high dose opioids. Withdrawals can be avoided by slowly decreasing the dose over a prolonged period of time.  What are withdrawals? Symptoms associated with sudden/abrupt reduction/stopping of high-dose, long-term use of pain medication. Withdrawal are seldom seen on low dose therapy, or patients rarely taking opioid medication.  Early Withdrawal Symptoms may include: Agitation Anxiety Muscle aches Increased tearing Insomnia Runny nose Sweating Yawning  Late symptoms may include: Abdominal cramping Diarrhea Dilated pupils Goose bumps Nausea Vomiting  (Last update: 01/11/2022) ____________________________________________________________________________________________    ____________________________________________________________________________________________  WARNING: CBD (cannabidiol) & Delta (Delta-8 tetrahydrocannabinol) products.   Applicable to:  All individuals currently taking or considering taking CBD (cannabidiol) and, more important, all patients taking opioid analgesic controlled substances (pain medication). (Example: oxycodone; oxymorphone; hydrocodone; hydromorphone; morphine; methadone;  tramadol; tapentadol; fentanyl; buprenorphine; butorphanol; dextromethorphan; meperidine; codeine; etc.)  Introduction:  Recently there has been a drive towards the use of "natural" products for the treatment of different conditions, including pain anxiety and sleep disorders. Marijuana and hemp are two varieties of the cannabis genus plants. Marijuana and its derivatives are illegal, while hemp and its derivatives are not. Cannabidiol (CBD) and tetrahydrocannabinol (THC), are two natural compounds found in plants of the Cannabis genus. They can both be extracted from hemp or marijuana. Both compounds interact with your body's endocannabinoid system in very different ways. CBD is associated with pain relief (analgesia) while THC is associated with the psychoactive effects ("the high") obtained from the use of marijuana products. There are two main types of THC: Delta-9, which comes from the marijuana plant and it is illegal, and Delta-8, which comes from the hemp plant, and it is legal. (Both, Delta-9-THC and Delta-8-THC are psychoactive and give you "the high".)   Legality:  Marijuana and its derivatives: illegal Hemp and its derivatives: Legal (State dependent) UPDATE: (03/21/2021) The Drug Enforcement Agency ([place]) issued a letter stating that "delta" cannabinoids, including Delta-8-THCO and Delta-9-THCO, synthetically derived from hemp do not qualify as hemp and will be viewed as Schedule I drugs. (Schedule I drugs, substances, or chemicals are defined as drugs with no currently accepted medical use and a high potential for abuse. Some examples of Schedule I drugs are: heroin, lysergic acid diethylamide (LSD), marijuana (cannabis), 3,4-methylenedioxymethamphetamine (ecstasy), methaqualone, and peyote.) (https://jennings.com/)  Legal status of CBD in [place]:  "Conditionally Legal"  Reference: "FDA Regulation of Cannabis  and Cannabis-Derived Products, Including Cannabidiol (CBD)" -  [employer].pt  Warning:  CBD is not FDA approved and has not undergo the same manufacturing controls as prescription drugs.  This means that the purity and safety of available CBD may be questionable. Most of the time, despite manufacturer's claims, it is contaminated with THC (delta-9-tetrahydrocannabinol - the chemical in marijuana responsible for the "HIGH").  When this is the case, the [facility] contaminant will trigger a positive urine drug screen (UDS) test for Marijuana (carboxy-THC).   The FDA recently put out a warning about 5 things that everyone should be aware of regarding Delta-8 THC: Delta-8 THC products have not been evaluated or approved by the FDA for safe use and may be marketed in ways that put the public health at risk. The FDA has received adverse event reports involving delta-8 THC-containing products. Delta-8 THC has psychoactive and intoxicating effects. Delta-8 THC manufacturing often involve use of potentially harmful chemicals to create the concentrations of delta-8 THC claimed in the marketplace. The final delta-8 THC product may have potentially harmful by-products (contaminants) due to the chemicals used in the process. Manufacturing of delta-8 THC products may occur in uncontrolled or unsanitary settings, which may lead to the presence of unsafe contaminants or other potentially harmful substances. Delta-8 THC products should be kept out of the reach of children and pets.  NOTE: Because a positive UDS for any illicit substance is a violation of our medication agreement, your opioid analgesics (pain medicine) may be permanently discontinued.  MORE ABOUT CBD  General Information: CBD was discovered in 50 and it is a derivative of the cannabis sativa genus plants (Marijuana and Hemp). It is one of the 113 identified substances found in Marijuana. It accounts  for up to 40% of the plant's extract. As of 2018, preliminary clinical studies on CBD included research for the treatment of anxiety, movement disorders, and pain. CBD is available and consumed in multiple forms, including inhalation of smoke or vapor, as an aerosol spray, and by mouth. It may be supplied as an oil containing CBD, capsules, dried cannabis, or as a liquid solution. CBD is thought not to be as psychoactive as THC (delta-9-tetrahydrocannabinol - the chemical in marijuana responsible for the "HIGH"). Studies suggest that CBD may interact with different biological target receptors in the body, including cannabinoid and other neurotransmitter receptors. As of 2018 the mechanism of action for its biological effects has not been determined.  Side-effects  Adverse reactions: Dry mouth, diarrhea, decreased appetite, fatigue, drowsiness, malaise, weakness, sleep disturbances, and others.  Drug interactions:  CBD may interact with medications such as blood-thinners. CBD causes drowsiness on its own and it will increase drowsiness caused by other medications, including antihistamines (such as Benadryl), benzodiazepines (Xanax, Ativan, Valium), antipsychotics, antidepressants, opioids, alcohol and supplements such as kava, melatonin and St. John's Wort.  Other drug interactions: Brivaracetam (Briviact); Caffeine; Carbamazepine (Tegretol); Citalopram (Celexa); Clobazam (Onfi); Eslicarbazepine (Aptiom); Everolimus (Zostress); Lithium; Methadone (Dolophine); Rufinamide (Banzel); Sedative medications (CNS depressants); Sirolimus (Rapamune); Stiripentol (Diacomit); Tacrolimus (Prograf); Tamoxifen ; Soltamox); Topiramate (Topamax); Valproate; Warfarin (Coumadin); Zonisamide. (Last update: 01/12/2022) ____________________________________________________________________________________________   ____________________________________________________________________________________________  Naloxone Nasal  Spray  Why am I receiving this medication? [place] STOP ACT requires that all patients taking high dose opioids or at risk of opioids respiratory depression, be prescribed an opioid reversal agent, such as Naloxone (AKA: Narcan).  What is this medication? NALOXONE (nal OX one) treats opioid overdose, which causes slow or shallow breathing, severe drowsiness, or trouble staying awake.  Call emergency services after using this medication. You may need additional treatment. Naloxone works by reversing the effects of opioids. It belongs to a group of medications called opioid blockers.  COMMON BRAND NAME(S): Kloxxado, Narcan  What should I tell my care team before I take this medication? They need to know if you have any of these conditions: Heart disease Substance use disorder An unusual or allergic reaction to naloxone, other medications, foods, dyes, or preservatives Pregnant or trying to get pregnant Breast-feeding  When to use this medication? This medication is to be used for the treatment of respiratory depression (less than 8 breaths per minute) secondary to opioid overdose.   How to use this medication? This medication is for use in the nose. Lay the person on their back. Support their neck with your hand and allow the head to tilt back before giving the medication. The nasal spray should be given into 1 nostril. After giving the medication, move the person onto their side. Do not remove or test the nasal spray until ready to use. Get emergency medical help right away after giving the first dose of this medication, even if the person wakes up. You should be familiar with how to recognize the signs and symptoms of a narcotic overdose. If more doses are needed, give the additional dose in the other nostril. Talk to your care team about the use of this medication in children. While this medication may be prescribed for children as young as newborns for selected conditions, precautions  do apply.  Naloxone Overdosage: If you think you have taken too much of this medicine contact a poison control center or emergency room at once.  NOTE: This medicine is only for you. Do not share this medicine with others.  What if I miss a dose? This does not apply.  What may interact with this medication? This is only used during an emergency. No interactions are expected during emergency use. This list may not describe all possible interactions. Give your health care provider a list of all the medicines, herbs, non-prescription drugs, or dietary supplements you use. Also tell them if you smoke, drink alcohol, or use illegal drugs. Some items may interact with your medicine.  What should I watch for while using this medication? Keep this medication ready for use in the case of an opioid overdose. Make sure that you have the phone number of your care team and local hospital ready. You may need to have additional doses of this medication. Each nasal spray contains a single dose. Some emergencies may require additional doses. After use, bring the treated person to the nearest hospital or call 911. Make sure the treating care team knows that the person has received a dose of this medication. You will receive additional instructions on what to do during and after use of this medication before an emergency occurs.  What side effects may I notice from receiving this medication? Side effects that you should report to your care team as soon as possible: Allergic reactions--skin rash, itching, hives, swelling of the face, lips, tongue, or throat Side effects that usually do not require medical attention (report these to your care team if they continue or are bothersome): Constipation Dryness or irritation inside the nose Headache Increase in blood pressure Muscle spasms Stuffy nose Toothache This list may not describe all possible side effects. Call your doctor for medical advice about side  effects. You may report side effects to FDA at 1-800-FDA-1088.  Where should I  keep my medication? Because this is an emergency medication, you should keep it with you at all times.  Keep out of the reach of children and pets. Store between 20 and 25 degrees C (68 and 77 degrees F). Do not freeze. Throw away any unused medication after the expiration date. Keep in original box until ready to use.  NOTE: This sheet is a summary. It may not cover all possible information. If you have questions about this medicine, talk to your doctor, pharmacist, or health care provider.   2023 Elsevier/Gold Standard (2020-09-27 00:00:00)  ____________________________________________________________________________________________

## 2022-03-25 NOTE — Progress Notes (Unsigned)
Nursing Pain Medication Assessment:  Safety precautions to be maintained throughout the outpatient stay will include: orient to surroundings, keep bed in low position, maintain call bell within reach at all times, provide assistance with transfer out of bed and ambulation.  Medication Inspection Compliance: Pill count conducted under aseptic conditions, in front of the patient. Neither the pills nor the bottle was removed from the patient's sight at any time. Once count was completed pills were immediately returned to the patient in their original bottle.  Medication: Morphine IR Pill/Patch Count:  7 of 60 pills remain Pill/Patch Appearance: Markings consistent with prescribed medication Bottle Appearance: Standard pharmacy container. Clearly labeled. Filled Date: 1 / 4 / 2024 Last Medication intake:  TodaySafety precautions to be maintained throughout the outpatient stay will include: orient to surroundings, keep bed in low position, maintain call bell within reach at all times, provide assistance with transfer out of bed and ambulation.

## 2022-03-26 DIAGNOSIS — E1165 Type 2 diabetes mellitus with hyperglycemia: Secondary | ICD-10-CM | POA: Diagnosis not present

## 2022-04-07 ENCOUNTER — Other Ambulatory Visit: Payer: Self-pay | Admitting: Family Medicine

## 2022-04-07 DIAGNOSIS — E1142 Type 2 diabetes mellitus with diabetic polyneuropathy: Secondary | ICD-10-CM

## 2022-04-10 ENCOUNTER — Other Ambulatory Visit: Payer: Self-pay | Admitting: Family Medicine

## 2022-04-10 DIAGNOSIS — K5903 Drug induced constipation: Secondary | ICD-10-CM

## 2022-04-14 ENCOUNTER — Encounter: Payer: Self-pay | Admitting: Pain Medicine

## 2022-04-14 ENCOUNTER — Ambulatory Visit
Admission: RE | Admit: 2022-04-14 | Discharge: 2022-04-14 | Disposition: A | Discharge status: Home / Self Care | Payer: 59 | Source: Ambulatory Visit | Attending: Pain Medicine | Admitting: Pain Medicine

## 2022-04-14 ENCOUNTER — Ambulatory Visit: Payer: 59 | Attending: Pain Medicine | Admitting: Pain Medicine

## 2022-04-14 VITALS — BP 124/77 | HR 77 | Temp 97.2°F | Resp 11 | Ht 73.0 in | Wt 265.0 lb

## 2022-04-14 DIAGNOSIS — Z91013 Allergy to seafood: Secondary | ICD-10-CM | POA: Insufficient documentation

## 2022-04-14 DIAGNOSIS — R55 Syncope and collapse: Secondary | ICD-10-CM | POA: Insufficient documentation

## 2022-04-14 DIAGNOSIS — Z91041 Radiographic dye allergy status: Secondary | ICD-10-CM | POA: Insufficient documentation

## 2022-04-14 DIAGNOSIS — M545 Low back pain, unspecified: Secondary | ICD-10-CM | POA: Diagnosis not present

## 2022-04-14 DIAGNOSIS — M47816 Spondylosis without myelopathy or radiculopathy, lumbar region: Secondary | ICD-10-CM | POA: Insufficient documentation

## 2022-04-14 DIAGNOSIS — M47817 Spondylosis without myelopathy or radiculopathy, lumbosacral region: Secondary | ICD-10-CM | POA: Diagnosis not present

## 2022-04-14 DIAGNOSIS — M5136 Other intervertebral disc degeneration, lumbar region: Secondary | ICD-10-CM | POA: Insufficient documentation

## 2022-04-14 DIAGNOSIS — M79604 Pain in right leg: Secondary | ICD-10-CM | POA: Diagnosis not present

## 2022-04-14 DIAGNOSIS — Z883 Allergy status to other anti-infective agents status: Secondary | ICD-10-CM | POA: Insufficient documentation

## 2022-04-14 DIAGNOSIS — G8929 Other chronic pain: Secondary | ICD-10-CM | POA: Diagnosis not present

## 2022-04-14 DIAGNOSIS — M79605 Pain in left leg: Secondary | ICD-10-CM | POA: Diagnosis not present

## 2022-04-14 MED ORDER — TRIAMCINOLONE ACETONIDE 40 MG/ML IJ SUSP
INTRAMUSCULAR | Status: AC
  Filled 2022-04-14: qty 2

## 2022-04-14 MED ORDER — ROPIVACAINE HCL 2 MG/ML IJ SOLN
INTRAMUSCULAR | Status: AC
  Filled 2022-04-14: qty 20

## 2022-04-14 MED ORDER — PENTAFLUOROPROP-TETRAFLUOROETH EX AERO
INHALATION_SPRAY | Freq: Once | CUTANEOUS | Status: DC
  Filled 2022-04-14: qty 116

## 2022-04-14 MED ORDER — MIDAZOLAM HCL 5 MG/5ML IJ SOLN
INTRAMUSCULAR | Status: AC
  Filled 2022-04-14: qty 5

## 2022-04-14 MED ORDER — LIDOCAINE HCL 2 % IJ SOLN
INTRAMUSCULAR | Status: AC
  Filled 2022-04-14: qty 20

## 2022-04-14 MED ORDER — TRIAMCINOLONE ACETONIDE 40 MG/ML IJ SUSP
80.0000 mg | Freq: Once | INTRAMUSCULAR | Status: AC
  Administered 2022-04-14: 80 mg

## 2022-04-14 MED ORDER — FENTANYL CITRATE (PF) 100 MCG/2ML IJ SOLN
INTRAMUSCULAR | Status: AC
  Filled 2022-04-14: qty 2

## 2022-04-14 MED ORDER — ROPIVACAINE HCL 2 MG/ML IJ SOLN
18.0000 mL | Freq: Once | INTRAMUSCULAR | Status: AC
  Administered 2022-04-14: 18 mL via PERINEURAL

## 2022-04-14 MED ORDER — LIDOCAINE HCL 2 % IJ SOLN
20.0000 mL | Freq: Once | INTRAMUSCULAR | Status: AC
  Administered 2022-04-14: 400 mg

## 2022-04-14 MED ORDER — MIDAZOLAM HCL 5 MG/5ML IJ SOLN
0.5000 mg | Freq: Once | INTRAMUSCULAR | Status: AC
  Administered 2022-04-14: 2 mg via INTRAVENOUS

## 2022-04-14 MED ORDER — GLYCOPYRROLATE 0.2 MG/ML IJ SOLN
0.2000 mg | Freq: Once | INTRAMUSCULAR | Status: AC
  Administered 2022-04-14: 0.2 mg via INTRAVENOUS

## 2022-04-14 MED ORDER — LACTATED RINGERS IV SOLN: Freq: Once | INTRAVENOUS | Status: AC

## 2022-04-14 MED ORDER — FENTANYL CITRATE (PF) 100 MCG/2ML IJ SOLN
25.0000 ug | INTRAMUSCULAR | Status: DC | PRN
  Administered 2022-04-14: 50 ug via INTRAVENOUS

## 2022-04-14 NOTE — Patient Instructions (Signed)
____________________________________________________________________________________________  Virtual Visits   ID our calls: Add these numbers to your list of contacts on your smart phone. Label it as "PAIN Management" Nursing: [phone] (Main) Dr. Burnham [place]: [phone]   What is a "Virtual Visit"? It is an electronic healthcare encounter (medical visit) that takes place on real time (NOT TEXT or E-MAIL) over the telephone or computer device (desktop, laptop, tablet, smart phone, etc.). It allows for more communication flexibility between the patient and the healthcare provider.  Who decides when these types of visits will be used? The physician.  Who is eligible for these types of visits? Only those patients that can be reliably reached over the telephone.  What do you mean by reliably? We do not have time to call everyone multiple times, therefore those patients that tend to screen calls and then call back later are not suitable candidates for this system. We all hate telemarketers and "Robocalls".  We understand how people are reluctant to pickup on calls from "unknown numbers", therefore, we suggest you add our numbers to your list of contacts. This way, you should be able to identify our calls. All of our numbers are available above.   Who is not eligible? This option is not appropriate for medication management. Patients on controlled substances have to come in for "Face-to-Face" encounters. Monitoring of these substances is mandatory. Virtual visits do not allow for unannounced drug screening tests or pill counts. Not bringing your pills, or the empty bottles, may result in no refill.  When will this type of visits be used? For follow-up after procedures on established patients, as long as they have had the same procedure done before.  Whenever you are physically unable to attend a regular appointment.   Can I request my medication visit to be "Virtual"? Yes. Available on  a limited basis, only if you are unable to physically attend your appointment. However, you may only receive a 30-day prescription. Abuse of this option may result in discontinuation of medication due to inability to properly monitor the therapy.  When will I be called?  You will receive an initial call from [phone], from our nursing staff, one business day prior to your appointment. (For Monday appointments you will be called on Friday.) The purpose of this call is to review your medications and the results of any recent procedure.  If the nursing staff is unable to make contact, your virtual encounter may be canceled and rescheduled to a face-to-face visit.  Your provider will call you on the day of your appointment.  At what time will I be called? Providers will call whenever there is time available. Do not expect calls at any specific time. On the schedule, you will have an appointment time assigned to you however, this is seldom accurate. This is done simply to keep a list of patients to be called. Be advised that calls may come at anytime during the day. Calls start as early as 8:00 AM and go as late as 8:00 PM. This will depend on provider availability. The system is not perfect. If this is inconvenient for you, please request to be changed to an "in-person" appointment.   ____________________________________________________________________________________________    ____________________________________________________________________________________________  Post-Procedure Discharge Instructions  Instructions: Apply ice:  Purpose: This will minimize any swelling and discomfort after procedure.  When: Day of procedure, as soon as you get home. How: Fill a plastic sandwich bag with crushed ice. Cover it with a small towel and apply to injection   site. How long: (15 min on, 15 min off) Apply for 15 minutes then remove x 15 minutes.  Repeat sequence on day of procedure, until you go to  bed. Apply heat:  Purpose: To treat any soreness and discomfort from the procedure. When: Starting the next day after the procedure. How: Apply heat to procedure site starting the day following the procedure. How long: May continue to repeat daily, until discomfort goes away. Food intake: Start with clear liquids (like water) and advance to regular food, as tolerated.  Physical activities: Keep activities to a minimum for the first 8 hours after the procedure. After that, then as tolerated. Driving: If you have received any sedation, be responsible and do not drive. You are not allowed to drive for 24 hours after having sedation. Blood thinner: (Applies only to those taking blood thinners) You may restart your blood thinner 6 hours after your procedure. Insulin: (Applies only to Diabetic patients taking insulin) As soon as you can eat, you may resume your normal dosing schedule. Infection prevention: Keep procedure site clean and dry. Shower daily and clean area with soap and water. Post-procedure Pain Diary: Extremely important that this be done correctly and accurately. Recorded information will be used to determine the next step in treatment. For the purpose of accuracy, follow these rules: Evaluate only the area treated. Do not report or include pain from an untreated area. For the purpose of this evaluation, ignore all other areas of pain, except for the treated area. After your procedure, avoid taking a long nap and attempting to complete the pain diary after you wake up. Instead, set your alarm clock to go off every hour, on the hour, for the initial 8 hours after the procedure. Document the duration of the numbing medicine, and the relief you are getting from it. Do not go to sleep and attempt to complete it later. It will not be accurate. If you received sedation, it is likely that you were given a medication that may cause amnesia. Because of this, completing the diary at a later time may  cause the information to be inaccurate. This information is needed to plan your care. Follow-up appointment: Keep your post-procedure follow-up evaluation appointment after the procedure (usually 2 weeks for most procedures, 6 weeks for radiofrequencies). DO NOT FORGET to bring you pain diary with you.   Expect: (What should I expect to see with my procedure?) From numbing medicine (AKA: Local Anesthetics): Numbness or decrease in pain. You may also experience some weakness, which if present, could last for the duration of the local anesthetic. Onset: Full effect within 15 minutes of injected. Duration: It will depend on the type of local anesthetic used. On the average, 1 to 8 hours.  From steroids (Applies only if steroids were used): Decrease in swelling or inflammation. Once inflammation is improved, relief of the pain will follow. Onset of benefits: Depends on the amount of swelling present. The more swelling, the longer it will take for the benefits to be seen. In some cases, up to 10 days. Duration: Steroids will stay in the system x 2 weeks. Duration of benefits will depend on multiple posibilities including persistent irritating factors. Side-effects: If present, they may typically last 2 weeks (the duration of the steroids). Frequent: Cramps (if they occur, drink Gatorade and take over-the-counter Magnesium 450-500 mg once to twice a day); water retention with temporary weight gain; increases in blood sugar; decreased immune system response; increased appetite. Occasional: Facial flushing (red, warm   cheeks); mood swings; menstrual changes. Uncommon: Long-term decrease or suppression of natural hormones; bone thinning. (These are more common with higher doses or more frequent use. This is why we prefer that our patients avoid having any injection therapies in other practices.)  Very Rare: Severe mood changes; psychosis; aseptic necrosis. From procedure: Some discomfort is to be expected once  the numbing medicine wears off. This should be minimal if ice and heat are applied as instructed.  Call if: (When should I call?) You experience numbness and weakness that gets worse with time, as opposed to wearing off. New onset bowel or bladder incontinence. (Applies only to procedures done in the spine)  Emergency Numbers: Durning business hours (Monday - Thursday, 8:00 AM - 4:00 PM) (Friday, 9:00 AM - 12:00 Noon): [phone] After hours: [phone] NOTE: If you are having a problem and are unable connect with, or to talk to a provider, then go to your nearest urgent care or emergency department. If the problem is serious and urgent, please call 911. ____________________________________________________________________________________________    

## 2022-04-14 NOTE — Progress Notes (Addendum)
PROVIDER NOTE: Interpretation of information contained herein should be left to medically-trained personnel. Specific patient instructions are provided elsewhere under "Patient Instructions" section of medical record. This document was created in part using STT-dictation technology, any transcriptional errors that may result from this process are unintentional.  Patient: Alan Schmidt Type: Established DOB: Nov 07, 1945 MRN: [id number] PCP: Alan Haven, MD  Service: Procedure DOS: 04/14/2022 Setting: Ambulatory Location: Ambulatory outpatient facility Delivery: Face-to-face Provider: Gaspar Cola, MD Specialty: Interventional Pain Management Specialty designation: 09 Location: Outpatient facility Ref. Prov.: Alan Pointer, MD       Interventional Therapy   Procedure: Lumbar Facet, Medial Branch Block(s) #4  Laterality: Bilateral  Level: L3, L4, L5, and S1 Medial Branch Level(s). Injecting these levels blocks the L4-5 and L5-S1 lumbar facet joints. Imaging: Fluoroscopic guidance         Anesthesia: Local anesthesia (1-2% Lidocaine) Anxiolysis: IV Versed         Sedation: Moderate Sedation Fentanyl         DOS: 04/14/2022 Performed by: Alan Cola, MD  Primary Purpose: Diagnostic/Therapeutic Indications: Low back pain severe enough to impact quality of life or function. 1. Lumbar facet syndrome (Bilateral) (R>L)   2. Lumbar facet joint osteoarthritis (Bilateral)   3. Spondylosis without myelopathy or radiculopathy, lumbosacral region   4. DDD (degenerative disc disease), lumbar   5. Chronic low back pain (1ry area of Pain) (Bilateral) w/o sciatica    History of allergy to povidone-iodine topical antiseptic    History of allergy to radiographic contrast media    History of allergy to shellfish    History of Vasovagal response to spinal injections    NAS-11 Pain score:   Pre-procedure: 8 /10   Post-procedure: 4  (right hip)/10     Position / Prep /  Materials:  Position: Prone  Prep solution: DuraPrep (Iodine Povacrylex [0.7% available iodine] and Isopropyl Alcohol, 74% w/w) Area Prepped: Posterolateral Lumbosacral Spine (Wide prep: From the lower border of the scapula down to the end of the tailbone and from flank to flank.)  Materials:  Tray: Block Needle(s):  Type: Spinal  Gauge (G): 22  Length: 5-in Qty: 4      Pre-op H&P Assessment:  [name] is a 77 y.o. (year old), male patient, seen today for interventional treatment. He  has a past surgical history that includes Total hip arthroplasty; Tonsillectomy; and right hip surgery. [name] has a current medication list which includes the following prescription(s): accu-chek guide, acetaminophen, alprazolam, aspirin ec, bupropion, carvedilol, freestyle libre 14 day reader, freestyle libre 14 day sensor, desoximetasone, empagliflozin, epinephrine, escitalopram, gabapentin, hydrochlorothiazide, insulin lispro, bd pen needle nano u/f, loratadine, lubiprostone, metformin, methocarbamol, mometasone, morphine, [START ON 05/06/2022] morphine, [START ON 06/05/2022] morphine, naloxone, olmesartan, ozempic (2 mg/dose), pantoprazole, rosuvastatin, sennosides-docusate sodium, tamsulosin, mounjaro, tresiba flextouch, and benefiber, and the following Facility-Administered Medications: fentanyl and pentafluoroprop-tetrafluoroeth. His primarily concern today is the Back Pain (lower)  Initial Vital Signs:  Pulse/HCG Rate: 84ECG Heart Rate: 77 (NSR) Temp: (!) 97.2 F (36.2 C) Resp: 20 BP: 124/85 SpO2: 97 %  BMI: Estimated body mass index is 34.96 kg/m as calculated from the following:   Height as of this encounter: '6\' 1"'$  (1.854 m).   Weight as of this encounter: 265 lb (120.2 kg).  Risk Assessment: Allergies: Reviewed. He is allergic to contrast media [iodinated contrast media], iodine, and shellfish allergy.  Allergy Precautions: None required Coagulopathies: Reviewed. None identified.   Blood-thinner therapy: None at this time Active  Infection(s): Reviewed. None identified. [name] is afebrile  Site Confirmation: [name] was asked to confirm the procedure and laterality before marking the site Procedure checklist: Completed Consent: Before the procedure and under the influence of no sedative(s), amnesic(s), or anxiolytics, the patient was informed of the treatment options, risks and possible complications. To fulfill our ethical and legal obligations, as recommended by the American Medical Association's Code of Ethics, I have informed the patient of my clinical impression; the nature and purpose of the treatment or procedure; the risks, benefits, and possible complications of the intervention; the alternatives, including doing nothing; the risk(s) and benefit(s) of the alternative treatment(s) or procedure(s); and the risk(s) and benefit(s) of doing nothing. The patient was provided information about the general risks and possible complications associated with the procedure. These may include, but are not limited to: failure to achieve desired goals, infection, bleeding, organ or nerve damage, allergic reactions, paralysis, and death. In addition, the patient was informed of those risks and complications associated to Spine-related procedures, such as failure to decrease pain; infection (i.e.: Meningitis, epidural or intraspinal abscess); bleeding (i.e.: epidural hematoma, subarachnoid hemorrhage, or any other type of intraspinal or peri-dural bleeding); organ or nerve damage (i.e.: Any type of peripheral nerve, nerve root, or spinal cord injury) with subsequent damage to sensory, motor, and/or autonomic systems, resulting in permanent pain, numbness, and/or weakness of one or several areas of the body; allergic reactions; (i.e.: anaphylactic reaction); and/or death. Furthermore, the patient was informed of those risks and complications associated with the medications. These  include, but are not limited to: allergic reactions (i.e.: anaphylactic or anaphylactoid reaction(s)); adrenal axis suppression; blood sugar elevation that in diabetics may result in ketoacidosis or comma; water retention that in patients with history of congestive heart failure may result in shortness of breath, pulmonary edema, and decompensation with resultant heart failure; weight gain; swelling or edema; medication-induced neural toxicity; particulate matter embolism and blood vessel occlusion with resultant organ, and/or nervous system infarction; and/or aseptic necrosis of one or more joints. Finally, the patient was informed that Medicine is not an exact science; therefore, there is also the possibility of unforeseen or unpredictable risks and/or possible complications that may result in a catastrophic outcome. The patient indicated having understood very clearly. We have given the patient no guarantees and we have made no promises. Enough time was given to the patient to ask questions, all of which were answered to the patient's satisfaction. Mr. Buehl has indicated that he wanted to continue with the procedure. Attestation: I, the ordering provider, attest that I have discussed with the patient the benefits, risks, side-effects, alternatives, likelihood of achieving goals, and potential problems during recovery for the procedure that I have provided informed consent. Date  Time: 04/14/2022  8:54 AM   Pre-Procedure Preparation:  Monitoring: As per clinic protocol. Respiration, ETCO2, SpO2, BP, heart rate and rhythm monitor placed and checked for adequate function Safety Precautions: Patient was assessed for positional comfort and pressure points before starting the procedure. Time-out: I initiated and conducted the "Time-out" before starting the procedure, as per protocol. The patient was asked to participate by confirming the accuracy of the "Time Out" information. Verification of the correct  person, site, and procedure were performed and confirmed by me, the nursing staff, and the patient. "Time-out" conducted as per Joint Commission's Universal Protocol (UP.01.01.01). Time: 1010  Description of Procedure:          Laterality: (see above) Targeted Levels: (see above)  Safety  Precautions: Aspiration looking for blood return was conducted prior to all injections. At no point did we inject any substances, as a needle was being advanced. Before injecting, the patient was told to immediately notify me if he was experiencing any new onset of "ringing in the ears, or metallic taste in the mouth". No attempts were made at seeking any paresthesias. Safe injection practices and needle disposal techniques used. Medications properly checked for expiration dates. SDV (single dose vial) medications used. After the completion of the procedure, all disposable equipment used was discarded in the proper designated medical waste containers. Local Anesthesia: Protocol guidelines were followed. The patient was positioned over the fluoroscopy table. The area was prepped in the usual manner. The time-out was completed. The target area was identified using fluoroscopy. A 12-in long, straight, sterile hemostat was used with fluoroscopic guidance to locate the targets for each level blocked. Once located, the skin was marked with an approved surgical skin marker. Once all sites were marked, the skin (epidermis, dermis, and hypodermis), as well as deeper tissues (fat, connective tissue and muscle) were infiltrated with a small amount of a short-acting local anesthetic, loaded on a 10cc syringe with a 25G, 1.5-in  Needle. An appropriate amount of time was allowed for local anesthetics to take effect before proceeding to the next step. Local Anesthetic: Lidocaine 2.0% The unused portion of the local anesthetic was discarded in the proper designated containers. Technical description of process:  L3 Medial Branch Nerve  Block (MBB): The target area for the L3 medial branch is at the junction of the postero-lateral aspect of the superior articular process and the superior, posterior, and medial edge of the transverse process of L4. Under fluoroscopic guidance, a Quincke needle was inserted until contact was made with os over the superior postero-lateral aspect of the pedicular shadow (target area). After negative aspiration for blood, 0.5 mL of the nerve block solution was injected without difficulty or complication. The needle was removed intact. L4 Medial Branch Nerve Block (MBB): The target area for the L4 medial branch is at the junction of the postero-lateral aspect of the superior articular process and the superior, posterior, and medial edge of the transverse process of L5. Under fluoroscopic guidance, a Quincke needle was inserted until contact was made with os over the superior postero-lateral aspect of the pedicular shadow (target area). After negative aspiration for blood, 0.5 mL of the nerve block solution was injected without difficulty or complication. The needle was removed intact. L5 Medial Branch Nerve Block (MBB): The target area for the L5 medial branch is at the junction of the postero-lateral aspect of the superior articular process and the superior, posterior, and medial edge of the sacral ala. Under fluoroscopic guidance, a Quincke needle was inserted until contact was made with os over the superior postero-lateral aspect of the pedicular shadow (target area). After negative aspiration for blood, 0.5 mL of the nerve block solution was injected without difficulty or complication. The needle was removed intact. S1 Medial Branch Nerve Block (MBB): The target area for the S1 medial branch is at the posterior and inferior 6 o'clock position of the L5-S1 facet joint. Under fluoroscopic guidance, the Quincke needle inserted for the L5 MBB was redirected until contact was made with os over the inferior and postero  aspect of the sacrum, at the 6 o' clock position under the L5-S1 facet joint (Target area). After negative aspiration for blood, 0.5 mL of the nerve block solution was injected without difficulty or  complication. The needle was removed intact.  Once the entire procedure was completed, the treated area was cleaned, making sure to leave some of the prepping solution back to take advantage of its long term bactericidal properties.         Illustration of the posterior view of the lumbar spine and the posterior neural structures. Laminae of L2 through S1 are labeled. DPRL5, dorsal primary ramus of L5; DPRS1, dorsal primary ramus of S1; DPR3, dorsal primary ramus of L3; FJ, facet (zygapophyseal) joint L3-L4; I, inferior articular process of L4; LB1, lateral branch of dorsal primary ramus of L1; IAB, inferior articular branches from L3 medial branch (supplies L4-L5 facet joint); IBP, intermediate branch plexus; MB3, medial branch of dorsal primary ramus of L3; NR3, third lumbar nerve root; S, superior articular process of L5; SAB, superior articular branches from L4 (supplies L4-5 facet joint also); TP3, transverse process of L3.  Vitals:   04/14/22 1025 04/14/22 1028 04/14/22 1037 04/14/22 1047  BP: 114/70  107/70 124/77  Pulse: 77     Resp: 16  (!) 9 11  Temp: (!) 97.1 F (36.2 C)   (!) 97.2 F (36.2 C)  TempSrc: Temporal   Temporal  SpO2: 92% (!) 85% 98% 98%  Weight:      Height:         Start Time: 1010 hrs. End Time: 1016 hrs.  Imaging Guidance (Spinal):          Type of Imaging Technique: Fluoroscopy Guidance (Spinal) Indication(s): Assistance in needle guidance and placement for procedures requiring needle placement in or near specific anatomical locations not easily accessible without such assistance. Exposure Time: Please see nurses notes. Contrast: None used. Fluoroscopic Guidance: I was personally present during the use of fluoroscopy. "Tunnel Vision Technique" used to obtain the  best possible view of the target area. Parallax error corrected before commencing the procedure. "Direction-depth-direction" technique used to introduce the needle under continuous pulsed fluoroscopy. Once target was reached, antero-posterior, oblique, and lateral fluoroscopic projection used confirm needle placement in all planes. Images permanently stored in EMR. Interpretation: No contrast injected. I personally interpreted the imaging intraoperatively. Adequate needle placement confirmed in multiple planes. Permanent images saved into the patient's record.  Post-operative Assessment:  Post-procedure Vital Signs:  Pulse/HCG Rate: 7781 Temp: (!) 97.2 F (36.2 C) Resp: 11 BP: 124/77 SpO2: 98 %  EBL: None  Complications: No immediate post-treatment complications observed by team, or reported by patient.  Note: The patient tolerated the entire procedure well. A repeat set of vitals were taken after the procedure and the patient was kept under observation following institutional policy, for this type of procedure. Post-procedural neurological assessment was performed, showing return to baseline, prior to discharge. The patient was provided with post-procedure discharge instructions, including a section on how to identify potential problems. Should any problems arise concerning this procedure, the patient was given instructions to immediately contact us, at any time, without hesitation. In any case, we plan to contact the patient by telephone for a follow-up status report regarding this interventional procedure.  Comments:  No additional relevant information.  Plan of Care (POC)  Orders:  Orders Placed This Encounter  Procedures   LUMBAR FACET(MEDIAL BRANCH NERVE BLOCK) MBNB    Scheduling Instructions:     Procedure: Lumbar facet block (AKA.: Lumbosacral medial branch nerve block)     Side: Bilateral     Level: L3-4, L4-5, and L5-S1 Facets (L2, L3, L4, L5, and S1 Medial Branch Nerves)  Sedation: Patient's choice.     Timeframe: Today    Order Specific Question:   Where will this procedure be performed?    Answer:   [facility]   DG [facility] C-ARM 1-60 MIN NO REPORT    Intraoperative interpretation by procedural physician at [name].    Standing Status:   Standing    Number of Occurrences:   1    Order Specific Question:   Reason for exam:    Answer:   Assistance in needle guidance and placement for procedures requiring needle placement in or near specific anatomical locations not easily accessible without such assistance.   Informed Consent Details: Physician/Practitioner Attestation; Transcribe to consent form and obtain patient signature    Nursing Order: Transcribe to consent form and obtain patient signature. Note: Always confirm laterality of pain with Mr. Grandville Silos, before procedure.    Order Specific Question:   Physician/Practitioner attestation of informed consent for procedure/surgical case    Answer:   I, the physician/practitioner, attest that I have discussed with the patient the benefits, risks, side effects, alternatives, likelihood of achieving goals and potential problems during recovery for the procedure that I have provided informed consent.    Order Specific Question:   Procedure    Answer:   Lumbar Facet Block  under fluoroscopic guidance    Order Specific Question:   Physician/Practitioner performing the procedure    Answer:   Carlen Rebuck A. Dossie Arbour MD    Order Specific Question:   Indication/Reason    Answer:   Low Back Pain, with our without leg pain, due to Facet Joint Arthralgia (Joint Pain) Spondylosis (Arthritis of the Spine), without myelopathy or radiculopathy (Nerve Damage).   Provide equipment / supplies at bedside    Procedure tray: "Block Tray" (Disposable  single use) Skin infiltration needle: Regular 1.5-in, 25-G, (x1) Block Needle type: Spinal Amount/quantity: 4 Size: Medium (5-inch) Gauge: 22G    Standing  Status:   Standing    Number of Occurrences:   1    Order Specific Question:   Specify    Answer:   Block Tray   Miscellanous precautions    Standing Status:   Standing    Number of Occurrences:   1   Miscellanous precautions    NOTE: Although It is true that patients can have allergies to shellfish and that shellfish contain iodine, most shellfish  allergies are due to two protein allergens present in the shellfish: tropomyosins and parvalbumin. Not all patients with shellfish allergies are allergic to iodine. However, as a precaution, avoid using iodine containing products.    Standing Status:   Standing    Number of Occurrences:   1   Skin care precautions    Patient indicates being allergic to a specific skin prepping solution. Please avoid.    Standing Status:   Standing    Number of Occurrences:   1   Chronic Opioid Analgesic:  Morphine ER (MS Contin) 30 mg, 1 tab p.o. twice daily (60 mg/day).  (Previously on oxycodone IR 10 mg every 6 hours (40 mg/day)) MME/day: 60 mg/day.   Medications ordered for procedure: Meds ordered this encounter  Medications   lidocaine (XYLOCAINE) 2 % (with pres) injection 400 mg   pentafluoroprop-tetrafluoroeth (GEBAUERS) aerosol   lactated ringers infusion   midazolam (VERSED) 5 MG/5ML injection 0.5-2 mg    Make sure Flumazenil is available in the pyxis when using this medication. If oversedation occurs, administer 0.2 mg IV over 15 sec.  If after 45 sec no response, administer 0.2 mg again over 1 min; may repeat at 1 min intervals; not to exceed 4 doses (1 mg)   fentaNYL (SUBLIMAZE) injection 25-50 mcg    Make sure Narcan is available in the pyxis when using this medication. In the event of respiratory depression (RR< 8/min): Titrate NARCAN (naloxone) in increments of 0.1 to 0.2 mg IV at 2-3 minute intervals, until desired degree of reversal.   glycopyrrolate (ROBINUL) injection 0.2 mg   ropivacaine (PF) 2 mg/mL (0.2%) (NAROPIN) injection 18 mL    triamcinolone acetonide (KENALOG-40) injection 80 mg   Medications administered: We administered lidocaine, lactated ringers, midazolam, fentaNYL, glycopyrrolate, ropivacaine (PF) 2 mg/mL (0.2%), and triamcinolone acetonide.  See the medical record for exact dosing, route, and time of administration.  Follow-up plan:   Return in about 2 weeks (around 04/28/2022) for Proc-day (T,Th), (VV), (PPE).       Interventional Therapies  Risk Factors  Considerations:   Allergy: CONTRAST, IODINE, & shellfish  VASOVAGAL RXN BNZ Tx   MO (L-FCT 5" needle to the hub)      NO MORE RFA - intraoperative noncompliance, unnecessarily increasing radiation exposure NOTE: NO MORE RFA procedures. Difficulty following intra-procedural instructions and commands, resulting in unnecessarily long exposure of staff to radiation.    Planned  Pending:   Therapeutic bilateral (L4-5, L5-S1) lumbar facet (L3-S1) MBB #4    Under consideration:   Diagnostic Left GONB   Completed:   Therapeutic bilateral lumbar facet (L2-S1) MBB x3 (12/16/21) (100/100/80/80)  Therapeutic left lumbar facet (L2-S1 MB) RFA x2 (05/09/2019) (90/90/77/>75)  Therapeutic right lumbar facet (L2-S1 MB) RFA x6 (12/14/2019) (100/100/70/70) (No intra-op compliance)  Diagnostic/therapeutic right small joint inj. (thumb) x1 (03/15/2018) (N/A)  Diagnostic/therapeutic left cervical ESI x1 (12/18/2015) (90/70/50/50)    Completed by other providers:   None at this time   Therapeutic  Palliative (PRN) options:   Palliative lumbar facet MBB  Therapeutic cervical ESI    Pharmacotherapy  Nonopioids transferred 12/14/2019: Gabapentin, Amitiza, Benefiber, Mobic       Recent Visits Date Type Provider Dept  03/25/22 Office Visit Alan Pointer, MD [facility]  Showing recent visits within past 90 days and meeting all other requirements Today's Visits Date Type Provider Dept  04/14/22 Procedure visit Alan Pointer, MD  [facility]  Showing today's visits and meeting all other requirements Future Appointments Date Type Provider Dept  04/28/22 Appointment Alan Pointer, MD [facility]  07/01/22 Appointment Alan Pointer, MD [facility]  Showing future appointments within next 90 days and meeting all other requirements  Disposition: Discharge home  Discharge (Date  Time): 04/14/2022; 1058 hrs.   Primary Care Physician: Alan Haven, MD Location: [facility] Note by: Alan Cola, MD (TTS technology used. I apologize for any typographical errors that were not detected and corrected.) Date: 04/14/2022; Time: 11:33 AM  Disclaimer:  Medicine is not an Chief Strategy Officer. The only guarantee in medicine is that nothing is guaranteed. It is important to note that the decision to proceed with this intervention was based on the information collected from the patient. The Data and conclusions were drawn from the patient's questionnaire, the interview, and the physical examination. Because the information was provided in large part by the patient, it cannot be guaranteed that it has not been purposely or unconsciously manipulated. Every effort has been made to obtain as much relevant data as possible for this evaluation. It is important to  note that the conclusions that lead to this procedure are derived in large part from the available data. Always take into account that the treatment will also be dependent on availability of resources and existing treatment guidelines, considered by other Pain Management Practitioners as being common knowledge and practice, at the time of the intervention. For Medico-Legal purposes, it is also important to point out that variation in procedural techniques and pharmacological choices are the acceptable norm. The indications, contraindications, technique, and results of the above procedure should only be interpreted and  judged by a Board-Certified Interventional Pain Specialist with extensive familiarity and expertise in the same exact procedure and technique.

## 2022-04-15 ENCOUNTER — Telehealth: Payer: Self-pay | Admitting: Pain Medicine

## 2022-04-15 ENCOUNTER — Ambulatory Visit: Payer: 59 | Admitting: Family Medicine

## 2022-04-15 ENCOUNTER — Telehealth: Payer: Self-pay

## 2022-04-15 NOTE — Telephone Encounter (Signed)
Spoke with patient and he states that the left side is doing well, but the right side is not doing as well.  Instructed patient to document on his pain diary and to give it a few days.  Instructed to call back for any questions or concerns.

## 2022-04-15 NOTE — Telephone Encounter (Signed)
Patient returning call states left side feels good. Right side having significant pain. Would like someone to call him

## 2022-04-15 NOTE — Telephone Encounter (Signed)
Post procedure follow up.  LM 

## 2022-04-16 ENCOUNTER — Telehealth: Payer: Self-pay

## 2022-04-16 ENCOUNTER — Other Ambulatory Visit: Payer: Self-pay

## 2022-04-16 NOTE — Telephone Encounter (Signed)
Pt returned [place] call. Unable to transfer.

## 2022-04-16 NOTE — Telephone Encounter (Signed)
This patient has not been seen since August. He no showed his appointment yesterday. He needs to be rescheduled and then I will refill to cover until he is seen. He needs to be in a 30 minute slot. If he does not show up to the next visit then I will not be able to refill this until he is seen again.

## 2022-04-16 NOTE — Telephone Encounter (Signed)
Patient has been scheduled to see [name] on 06/08/2022.

## 2022-04-16 NOTE — Telephone Encounter (Signed)
Left 2nd message for the Patient to call the office back

## 2022-04-16 NOTE — Telephone Encounter (Signed)
Left message to call the office back.

## 2022-04-16 NOTE — Telephone Encounter (Signed)
Patient states he is returning our call.  I read Dr. Georges Mouse message to patient.  Patient apologizes for missing his appointment.  Patient states he thought his appointment was next week.  Patient states he just had a hip revision surgery on 04/14/2022.  Patient states he is experiencing quite a bit of pain.

## 2022-04-17 ENCOUNTER — Ambulatory Visit: Payer: 59 | Admitting: Family Medicine

## 2022-04-17 MED ORDER — ALPRAZOLAM 1 MG PO TABS: ORAL_TABLET | ORAL | 0 refills | Status: DC

## 2022-04-24 ENCOUNTER — Other Ambulatory Visit: Payer: Self-pay | Admitting: Family Medicine

## 2022-04-24 DIAGNOSIS — E1142 Type 2 diabetes mellitus with diabetic polyneuropathy: Secondary | ICD-10-CM

## 2022-04-25 DIAGNOSIS — E1165 Type 2 diabetes mellitus with hyperglycemia: Secondary | ICD-10-CM | POA: Diagnosis not present

## 2022-04-28 ENCOUNTER — Ambulatory Visit: Payer: 59 | Attending: Pain Medicine | Admitting: Pain Medicine

## 2022-04-28 DIAGNOSIS — M545 Low back pain, unspecified: Secondary | ICD-10-CM

## 2022-04-28 DIAGNOSIS — M47816 Spondylosis without myelopathy or radiculopathy, lumbar region: Secondary | ICD-10-CM | POA: Diagnosis not present

## 2022-04-28 DIAGNOSIS — G8929 Other chronic pain: Secondary | ICD-10-CM | POA: Diagnosis not present

## 2022-04-28 DIAGNOSIS — M25551 Pain in right hip: Secondary | ICD-10-CM | POA: Diagnosis not present

## 2022-04-28 DIAGNOSIS — Z96641 Presence of right artificial hip joint: Secondary | ICD-10-CM

## 2022-04-28 DIAGNOSIS — M5136 Other intervertebral disc degeneration, lumbar region: Secondary | ICD-10-CM | POA: Diagnosis not present

## 2022-04-28 MED ORDER — DIAZEPAM 5 MG PO TABS: 5.0000 mg | ORAL_TABLET | ORAL | 0 refills | Status: DC

## 2022-04-28 NOTE — Progress Notes (Signed)
Patient: Alan Schmidt  Service Category: E/M  Provider: Gaspar Cola, MD  DOB: 02-14-1945  DOS: 04/28/2022  Location: Office  MRN: [id number]  Setting: Ambulatory outpatient  Referring Provider: [name], MD  Type: Established Patient  Specialty: Interventional Pain Management  PCP: [name], MD  Location: Remote location  Delivery: TeleHealth     Virtual Encounter - Pain Management PROVIDER NOTE: Information contained herein reflects review and annotations entered in association with encounter. Interpretation of such information and data should be left to medically-trained personnel. Information provided to patient can be located elsewhere in the medical record under "Patient Instructions". Document created using STT-dictation technology, any transcriptional errors that may result from process are unintentional.    Contact & Pharmacy Preferred: [phone] Home: [phone] (home) Mobile: There is no such number on file (mobile). E-mail: [email or web address]  [id number] [address]ina [address]on, [place] [address] [place] [address] Phone: [phone] Fax: [phone]  [facility] [place], [address] [place] [address] Phone: [phone] Fax: [phone]   [place]  Alan Schmidt offered "in-person" vs "virtual" encounter. He indicated preferring virtual for this encounter.   Reason COVID-19*  Social distancing based on CDC and AMA recommendations.   I contacted Alan Schmidt on 04/28/2022 via telephone.      I clearly identified myself as Gaspar Cola, MD. I verified that I was speaking with the correct person using two identifiers (Name: Alan Schmidt, and date of birth: April 01, 1945).  Consent I sought verbal advanced consent from Alan Schmidt for virtual visit interactions. I informed Alan Schmidt of possible security and privacy concerns,  risks, and limitations associated with providing "not-in-person" medical evaluation and management services. I also informed Alan Schmidt of the availability of "in-person" appointments. Finally, I informed him that there would be a charge for the virtual visit and that he could be  personally, fully or partially, financially responsible for it. Alan Schmidt expressed understanding and agreed to proceed.   Historic Elements   Alan Schmidt is a 77 y.o. year old, male patient evaluated today after our last contact on 04/15/2022. Alan Schmidt  has a past medical history of Acute postoperative pain (11/24/2016), Anxiety, Chronic hip pain (Right) (12/05/2014), Chronic lumbar pain, Depression, Diabetes mellitus without complication ([place]), Hyperlipidemia, Hypertension, Kidney stones, and Migraines. He also  has a past surgical history that includes Total hip arthroplasty; Tonsillectomy; and right hip surgery. Alan Schmidt has a current medication list which includes the following prescription(s): accu-chek guide, acetaminophen, alprazolam, aspirin ec, bupropion, carvedilol, freestyle libre 14 day reader, freestyle libre 14 day sensor, desoximetasone, diazepam, empagliflozin, epinephrine, escitalopram, gabapentin, hydrochlorothiazide, insulin lispro, bd pen needle nano u/f, loratadine, lubiprostone, metformin, methocarbamol, mometasone, morphine, [START ON 05/06/2022] morphine, [START ON 06/05/2022] morphine, naloxone, olmesartan, ozempic (2 mg/dose), pantoprazole, rosuvastatin, sennosides-docusate sodium, tamsulosin, mounjaro, tresiba flextouch, and benefiber. He  reports that he has quit smoking. His smoking use included cigarettes. He has never used smokeless tobacco. He reports that he does not drink alcohol and does not use drugs. Alan Schmidt is allergic to contrast media [iodinated contrast media], iodine, and shellfish allergy.  BMI: Estimated body mass index is 34.96 kg/m as calculated from the following:    Height as of 04/14/22: 6\' 1"  (1.854 m).   Weight as of 04/14/22: 265 lb (120.2 kg). Last encounter: 03/25/2022. Last procedure: 04/14/2022.  HPI  Today, he is being contacted for  a post-procedure assessment.  Today's residual visit took quite some time since the patient is very talkative and sometimes he will not really stop to carry on a conversation.  In any case, eventually I retrieved the information that I needed from this procedure.  As it turns out, the pain on the left side seems to all be coming from the facet joints however that on the right side only 50% seems to be coming from the facet joints and the other 50% seems to be from somewhere else.  This has changed over time and according to my records the last time that he had any 3D imaging of the lumbar spine was around 03/25/2017 when he had a CT scan.  Today I have ordered an MRI and he refers being somewhat claustrophobic and therefore I had to send a prescription for Valium to his pharmacy so that he could have the procedure done.  I also took the time to remind him that he needs to have a driver if he is to take that Valium since we do not want him driving around under the influence.  He indicated having understood.  Post-procedure evaluation   Procedure: Lumbar Facet, Medial Branch Block(s) #4  Laterality: Bilateral  Level: L3, L4, L5, and S1 Medial Branch Level(s). Injecting these levels blocks the L4-5 and L5-S1 lumbar facet joints. Imaging: Fluoroscopic guidance         Anesthesia: Local anesthesia (1-2% Lidocaine) Anxiolysis: IV Versed         Sedation: Moderate Sedation Fentanyl         DOS: 04/14/2022 Performed by: Gaspar Cola, MD  Primary Purpose: Diagnostic/Therapeutic Indications: Low back pain severe enough to impact quality of life or function. 1. Lumbar facet syndrome (Bilateral) (R>L)   2. Lumbar facet joint osteoarthritis (Bilateral)   3. Spondylosis without myelopathy or radiculopathy, lumbosacral region    4. DDD (degenerative disc disease), lumbar   5. Chronic low back pain (1ry area of Pain) (Bilateral) w/o sciatica    History of allergy to povidone-iodine topical antiseptic    History of allergy to radiographic contrast media    History of allergy to shellfish    History of Vasovagal response to spinal injections    NAS-11 Pain score:   Pre-procedure: 8 /10   Post-procedure: 4  (right hip)/10     Effectiveness:  Initial hour after procedure:  (100 on left, 50 right). Subsequent 4-6 hours post-procedure:  (100 on left, 50 on right). Analgesia past initial 6 hours:  (100 on left, 50 on right). Ongoing improvement:  Analgesic: The patient indicates having attained 100% relief of the pain for the duration of the local anesthetic which has continued on the left side.  However on the right side he refers that even when the local anesthetic was in place he only attained 50% benefit which has also persisted at this time.  However this also means that 50% of the pain on the right side is not coming from the facet joints and therefore I believe it is time for [place] to do an MRI of that area.  He has been complaining of pain going down the leg that although is not constant could be related to intraspinal problems. Function: Alan Schmidt reports improvement in function ROM: Alan Schmidt reports improvement in ROM  Pharmacotherapy Assessment   Opioid Analgesic: Morphine ER (MS Contin) 30 mg, 1 tab p.o. twice daily (60 mg/day).  (Previously on oxycodone IR 10 mg every 6 hours (40 mg/day))  MME/day: 60 mg/day.   Monitoring: [place] PMP: PDMP not reviewed this encounter.       Pharmacotherapy: No side-effects or adverse reactions reported. Compliance: No problems identified. Effectiveness: Clinically acceptable. Plan: Refer to "POC". UDS:  Summary  Date Value Ref Range Status  09/29/2021 Note  Final    Comment:    ==================================================================== [employer] Select 13  (MW) ==================================================================== Test                             Result       Flag       Units  Drug Present and Declared for Prescription Verification   Alprazolam                     109          EXPECTED   ng/mg creat   Alpha-hydroxyalprazolam        314          EXPECTED   ng/mg creat    Source of alprazolam is a scheduled prescription medication. Alpha-    hydroxyalprazolam is an expected metabolite of alprazolam.    Morphine                       >9709        EXPECTED   ng/mg creat   Normorphine                    290          EXPECTED   ng/mg creat    Potential sources of large amounts of morphine in the absence of    codeine include administration of morphine or use of heroin.     Normorphine is an expected metabolite of morphine.    Hydromorphone                  236          EXPECTED   ng/mg creat    Hydromorphone may be present as a metabolite of morphine;    concentrations of hydromorphone rarely exceed 5% of the morphine    concentration when this is the source of hydromorphone.  ==================================================================== Test                      Result    Flag   Units      Ref Range   Creatinine              103              mg/dL      >=20 ==================================================================== Declared Medications:  The flagging and interpretation on this report are based on the  following declared medications.  Unexpected results may arise from  inaccuracies in the declared medications.   **Note: The testing scope of this panel includes these medications:   Alprazolam (Xanax)  Morphine (MS Contin)   **Note: The testing scope of this panel does not include the  following reported medications:   Acetaminophen (Tylenol)  Aspirin  Bupropion (Wellbutrin)  Carvedilol (Coreg)  Desoximetasone (Topicort)  Empagliflozin (Jardiance)  Epinephrine (EpiPen)  Escitalopram (Lexapro)   Gabapentin (Neurontin)  Hydrochlorothiazide (Hydrodiuril)  Insulin Tyler Aas)  Loratadine (Claritin)  Lubiprostone (Amitiza)  Metformin (Glucophage)  Methocarbamol (Robaxin)  Mometasone (Nasonex)  Naloxone (Narcan)  Olmesartan (Benicar)  Pantoprazole (Protonix)  Rosuvastatin (Crestor)  Sennosides (Senokot)  Supplement (Fiber)  Tamsulosin (Flomax)  Tirzepatide Darcel Bayley) ==================================================================== For clinical consultation, please call [phone]. ====================================================================    No results found for: "CBDTHCR", "D8THCCBX", "D9THCCBX"   Laboratory Chemistry Profile   Renal Lab Results  Component Value Date   BUN 12 04/25/2021   CREATININE 1.02 Q000111Q   BCR NOT APPLICABLE Q000111Q   GFR 76.84 09/30/2020   GFRAA >60 03/26/2018   GFRNONAA >60 03/26/2018    Hepatic Lab Results  Component Value Date   AST 18 06/17/2021   ALT 26 06/17/2021   ALBUMIN 3.9 06/17/2021   ALKPHOS 71 06/17/2021    Electrolytes Lab Results  Component Value Date   NA 140 04/25/2021   K 4.0 04/25/2021   CL 101 04/25/2021   CALCIUM 9.7 04/25/2021   MG 1.8 03/06/2015    Bone No results found for: "VD25OH", "VD125OH2TOT", "[id number]", "UK:060616", "25OHVITD1", "25OHVITD2", "A1577888", "TESTOFREE", "TESTOSTERONE"  Inflammation (CRP: Acute Phase) (ESR: Chronic Phase) Lab Results  Component Value Date   CRP 0.5 03/06/2015   ESRSEDRATE 45 (H) 12/14/2016         Note: Above Lab results reviewed.  Imaging  DG PAIN CLINIC C-ARM 1-60 MIN NO REPORT Fluoro was used, but no Radiologist interpretation will be provided.  Please refer to "NOTES" tab for provider progress note.  Assessment  The primary encounter diagnosis was Chronic low back pain (1ry area of Pain) (Bilateral) w/o sciatica. 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  Plan of Care  Problem-specific:  No problem-specific Assessment & Plan notes found for this encounter.  Alan Schmidt has a current medication list which includes the following long-term medication(s): bupropion, carvedilol, escitalopram, gabapentin, hydrochlorothiazide, insulin lispro, loratadine, lubiprostone, metformin, mometasone, morphine, [START ON 05/06/2022] morphine, [START ON 06/05/2022] morphine, olmesartan, pantoprazole, rosuvastatin, and benefiber.  Pharmacotherapy (Medications Ordered): Meds ordered this encounter  Medications   diazepam (VALIUM) 5 MG tablet    Sig: Take 1 tablet (5 mg total) by mouth 60 (sixty) minutes before procedure for 1 dose. 1 tab PO 60 min pre-MRI. If still anxious, take 2nd tab 15 min just before MRI. Max: 2 tbs (10 mg). Avoid taking opioid pain medications within 4 hours of taking valium. Must have a driver. Do not drive or operate machinery x 24 hours after taking this medication.    Dispense:  2 tablet    Refill:  0    One time prescription. Do not request refills.   Orders:  Orders Placed This Encounter  Procedures   MR LUMBAR SPINE WO CONTRAST    Patient presents with axial pain with possible radicular component. Please assist [place] in identifying specific level(s) and laterality of any additional findings such as: 1. Facet (Zygapophyseal) joint DJD (Hypertrophy, space narrowing, subchondral sclerosis, and/or osteophyte formation) 2. DDD and/or IVDD (Loss of disc height, desiccation, gas patterns, osteophytes, endplate sclerosis, or "Black disc disease") 3. Pars defects 4. Spondylolisthesis, spondylosis, and/or spondyloarthropathies (include Degree/Grade of displacement in mm) (stability) 5. Vertebral body Fractures (acute/chronic) (state percentage of collapse) 6. Demineralization (osteopenia/osteoporotic) 7. Bone  pathology 8. Foraminal narrowing  9. Surgical changes 10. Central, Lateral Recess, and/or Foraminal Stenosis (include AP diameter of stenosis in mm) 11. Surgical changes (hardware type, status, and presence of fibrosis) 12. Modic Type Changes (MRI only) 13. IVDD (Disc bulge, protrusion, herniation, extrusion) (Level, laterality, extent)    Standing Status:   Future  Standing Expiration Date:   05/29/2022    Scheduling Instructions:     Please make sure that the patient understands that this needs to be done as soon as possible. Never have the patient do the imaging "just before the next appointment". Inform patient that having the imaging done within the [facility] will expedite the availability of the results and will provide      imaging availability to the requesting physician. In addition inform the patient that the imaging order has an expiration date and will not be renewed if not done within the active period.    Order Specific Question:   What is the patient's sedation requirement?    Answer:   No Sedation    Order Specific Question:   Does the patient have a pacemaker or implanted devices?    Answer:   No    Order Specific Question:   Preferred imaging location?    Answer:   [facility] (table limit-350lbs)    Order Specific Question:   Call Results- Best Contact Number?    Answer:   [phone] ([facility])    Order Specific Question:   Radiology Contrast Protocol - do NOT remove file path    Answer:   [email or web address]   Follow-up plan:   Return for scheduled encounter.      Interventional Therapies  Risk Factors  Considerations:   Allergy: CONTRAST, IODINE, & shellfish  VASOVAGAL RXN BNZ Tx   MO (L-FCT 5" needle to the hub)      NO MORE RFA - intraoperative noncompliance, unnecessarily increasing radiation exposure NOTE: NO MORE RFA procedures. Difficulty following intra-procedural instructions and commands, resulting  in unnecessarily long exposure of staff to radiation.    Planned  Pending:   Therapeutic bilateral (L4-5, L5-S1) lumbar facet (L3-S1) MBB #4    Under consideration:   Diagnostic Left GONB   Completed:   Therapeutic bilateral lumbar facet (L2-S1) MBB x3 (12/16/21) (100/100/80/80)  Therapeutic left lumbar facet (L2-S1 MB) RFA x2 (05/09/2019) (90/90/77/>75)  Therapeutic right lumbar facet (L2-S1 MB) RFA x6 (12/14/2019) (100/100/70/70) (No intra-op compliance)  Diagnostic/therapeutic right small joint inj. (thumb) x1 (03/15/2018) (N/A)  Diagnostic/therapeutic left cervical ESI x1 (12/18/2015) (90/70/50/50)    Completed by other providers:   None at this time   Therapeutic  Palliative (PRN) options:   Palliative lumbar facet MBB  Therapeutic cervical ESI    Pharmacotherapy  Nonopioids transferred 12/14/2019: Gabapentin, Amitiza, Benefiber, Mobic       Recent Visits Date Type Provider Dept  04/14/22 Procedure visit Milinda Pointer, MD [facility]  03/25/22 Office Visit Milinda Pointer, MD [facility]  Showing recent visits within past 90 days and meeting all other requirements Today's Visits Date Type Provider Dept  04/28/22 Office Visit Milinda Pointer, MD [facility]  Showing today's visits and meeting all other requirements Future Appointments Date Type Provider Dept  07/01/22 Appointment Milinda Pointer, MD [facility]  Showing future appointments within next 90 days and meeting all other requirements  I discussed the assessment and treatment plan with the patient. The patient was provided an opportunity to ask questions and all were answered. The patient agreed with the plan and demonstrated an understanding of the instructions.  Patient advised to call back or seek an in-person evaluation if the symptoms or condition worsens.  Duration of encounter: 22 minutes.  Note by: Gaspar Cola, MD Date: 04/28/2022;  Time: 12:47 PM

## 2022-05-01 ENCOUNTER — Other Ambulatory Visit: Payer: Self-pay | Admitting: Family Medicine

## 2022-05-01 DIAGNOSIS — N401 Enlarged prostate with lower urinary tract symptoms: Secondary | ICD-10-CM

## 2022-05-01 DIAGNOSIS — K219 Gastro-esophageal reflux disease without esophagitis: Secondary | ICD-10-CM

## 2022-05-01 DIAGNOSIS — E1142 Type 2 diabetes mellitus with diabetic polyneuropathy: Secondary | ICD-10-CM

## 2022-05-01 DIAGNOSIS — I10 Essential (primary) hypertension: Secondary | ICD-10-CM

## 2022-05-06 ENCOUNTER — Ambulatory Visit: Admission: RE | Admit: 2022-05-06 | Payer: 59 | Source: Ambulatory Visit

## 2022-05-07 ENCOUNTER — Other Ambulatory Visit: Payer: Self-pay | Admitting: Family Medicine

## 2022-05-07 DIAGNOSIS — F419 Anxiety disorder, unspecified: Secondary | ICD-10-CM

## 2022-05-26 ENCOUNTER — Ambulatory Visit: Admission: RE | Admit: 2022-05-26 | Payer: 59 | Source: Ambulatory Visit

## 2022-05-28 ENCOUNTER — Ambulatory Visit
Admission: RE | Admit: 2022-05-28 | Discharge: 2022-05-28 | Disposition: A | Discharge status: Home / Self Care | Payer: 59 | Source: Ambulatory Visit | Attending: Pain Medicine | Admitting: Pain Medicine

## 2022-05-28 ENCOUNTER — Other Ambulatory Visit: Payer: Self-pay | Admitting: Family Medicine

## 2022-05-28 DIAGNOSIS — M545 Low back pain, unspecified: Secondary | ICD-10-CM | POA: Diagnosis not present

## 2022-05-28 DIAGNOSIS — M47816 Spondylosis without myelopathy or radiculopathy, lumbar region: Secondary | ICD-10-CM | POA: Insufficient documentation

## 2022-05-28 DIAGNOSIS — M5136 Other intervertebral disc degeneration, lumbar region: Secondary | ICD-10-CM | POA: Diagnosis not present

## 2022-05-28 DIAGNOSIS — G8929 Other chronic pain: Secondary | ICD-10-CM | POA: Insufficient documentation

## 2022-05-28 DIAGNOSIS — J3089 Other allergic rhinitis: Secondary | ICD-10-CM

## 2022-05-28 DIAGNOSIS — E785 Hyperlipidemia, unspecified: Secondary | ICD-10-CM

## 2022-05-28 DIAGNOSIS — Z794 Long term (current) use of insulin: Secondary | ICD-10-CM

## 2022-06-02 ENCOUNTER — Other Ambulatory Visit: Payer: Self-pay

## 2022-06-02 ENCOUNTER — Telehealth: Payer: Self-pay | Admitting: Family Medicine

## 2022-06-02 DIAGNOSIS — Z794 Long term (current) use of insulin: Secondary | ICD-10-CM

## 2022-06-02 DIAGNOSIS — E1142 Type 2 diabetes mellitus with diabetic polyneuropathy: Secondary | ICD-10-CM

## 2022-06-02 MED ORDER — FREESTYLE LIBRE 14 DAY SENSOR MISC
1.0000 | 3 refills | Status: DC
Start: 2022-06-02 — End: 2023-06-16

## 2022-06-02 NOTE — Telephone Encounter (Signed)
Libre refill sent to [facility]

## 2022-06-02 NOTE — Telephone Encounter (Signed)
Pt need a refill on libre sent to [facility]

## 2022-06-03 ENCOUNTER — Encounter: Payer: Self-pay | Admitting: Pain Medicine

## 2022-06-03 DIAGNOSIS — R937 Abnormal findings on diagnostic imaging of other parts of musculoskeletal system: Secondary | ICD-10-CM | POA: Insufficient documentation

## 2022-06-08 ENCOUNTER — Ambulatory Visit (INDEPENDENT_AMBULATORY_CARE_PROVIDER_SITE_OTHER): Payer: 59 | Admitting: Family Medicine

## 2022-06-08 ENCOUNTER — Encounter: Payer: Self-pay | Admitting: Family Medicine

## 2022-06-08 VITALS — BP 124/80 | HR 104 | Temp 98.6°F | Ht 73.0 in | Wt 250.4 lb

## 2022-06-08 DIAGNOSIS — G8929 Other chronic pain: Secondary | ICD-10-CM

## 2022-06-08 DIAGNOSIS — Z96641 Presence of right artificial hip joint: Secondary | ICD-10-CM

## 2022-06-08 DIAGNOSIS — F32A Depression, unspecified: Secondary | ICD-10-CM

## 2022-06-08 DIAGNOSIS — F419 Anxiety disorder, unspecified: Secondary | ICD-10-CM | POA: Diagnosis not present

## 2022-06-08 DIAGNOSIS — E1142 Type 2 diabetes mellitus with diabetic polyneuropathy: Secondary | ICD-10-CM | POA: Diagnosis not present

## 2022-06-08 DIAGNOSIS — M5441 Lumbago with sciatica, right side: Secondary | ICD-10-CM | POA: Diagnosis not present

## 2022-06-08 DIAGNOSIS — E785 Hyperlipidemia, unspecified: Secondary | ICD-10-CM | POA: Diagnosis not present

## 2022-06-08 DIAGNOSIS — R2689 Other abnormalities of gait and mobility: Secondary | ICD-10-CM

## 2022-06-08 DIAGNOSIS — M5442 Lumbago with sciatica, left side: Secondary | ICD-10-CM | POA: Diagnosis not present

## 2022-06-08 DIAGNOSIS — Z794 Long term (current) use of insulin: Secondary | ICD-10-CM | POA: Diagnosis not present

## 2022-06-08 NOTE — Progress Notes (Unsigned)
[name], MD Phone: [phone]  Alan Schmidt is a 77 y.o. male who presents today for follow-up.  Anxiety/depression: Patient notes this is going pretty well all things considered.  He had a stressful event with an albumin he released recently where the sound was not as good as he thought it was going to be.  He notes no SI.  He continues on Xanax, Wellbutrin, and Lexapro.  Diabetes: Patient notes his sugar has been up for a long time.  He has been on Jardiance, Humalog, metformin, Mounjaro, and Tresiba.  He notes some chronic polyuria.  He notes no recent hypoglycemia.  Falls: Patient notes he is fallen a couple of times in the last 6 months.  Reports his balance is chronically off.  He was doing physical therapy for this in the past which she thought was fairly helpful.  Social History   Tobacco Use  Smoking Status Former   Types: Cigarettes  Smokeless Tobacco Never    Current Outpatient Medications on File Prior to Visit  Medication Sig Dispense Refill   ACCU-CHEK GUIDE test strip USE AS INSTRUCTED DX CODE: E11.9 100 strip 5   acetaminophen (TYLENOL) 500 MG tablet Take 500 mg by mouth every 6 (six) hours as needed.     ALPRAZolam (XANAX) 1 MG tablet TAKE 1/2 TABLET BY MOUTH 2 TIMES DAILY AS NEEDED FOR ANXIETY. 30 tablet 0   aspirin EC 81 MG tablet Take 81 mg by mouth daily.     buPROPion (WELLBUTRIN XL) 150 MG 24 hr tablet TAKE 1 TABLET (150 MG TOTAL) BY MOUTH DAILY FOR 7 DAYS, THEN 2 TABLETS (300 MG TOTAL) DAILY. 180 tablet 0   carvedilol (COREG) 25 MG tablet Take 1 tablet (25 mg total) by mouth 2 (two) times daily. 180 tablet 3   Continuous Blood Gluc Receiver (FREESTYLE LIBRE 14 DAY READER) DEVI 1 Device by Does not apply route daily. Use to scan to check blood sugar up to 7 times daily; E11.42, 1 Device 1   Continuous Glucose Sensor (FREESTYLE LIBRE 14 DAY SENSOR) MISC 1 Device by Does not apply route every 14 (fourteen) days. E11.42 6 each 3   desoximetasone  (TOPICORT) 0.25 % cream APPLY CREAM TO AFFECTED AREA TWO TIMES DAILY, FOR UP TO 7 DAYS, DO NOT APPLY TO FACE 30 g 0   diazepam (VALIUM) 5 MG tablet Take 1 tablet (5 mg total) by mouth 60 (sixty) minutes before procedure for 1 dose. 1 tab PO 60 min pre-MRI. If still anxious, take 2nd tab 15 min just before MRI. Max: 2 tbs (10 mg). Avoid taking opioid pain medications within 4 hours of taking valium. Must have a driver. Do not drive or operate machinery x 24 hours after taking this medication. 2 tablet 0   empagliflozin (JARDIANCE) 25 MG TABS tablet Take 1 tablet (25 mg total) by mouth daily. 90 tablet 3   EPINEPHrine (EPIPEN 2-PAK) 0.3 mg/0.3 mL IJ SOAJ injection Inject 0.3 mg into the muscle as needed for anaphylaxis. 2 each 0   escitalopram (LEXAPRO) 20 MG tablet Take 1 tablet by mouth daily. 30 tablet 0   gabapentin (NEURONTIN) 250 MG/5ML solution Take 6 ml by mouth at bedtime. 180 mL 1   hydrochlorothiazide (HYDRODIURIL) 25 MG tablet Take 1 tablet by mouth daily. 90 tablet 1   insulin lispro (HUMALOG KWIKPEN) 100 UNIT/ML KwikPen INJECT 5 TO 7 UNITS UNDER THE SKIN THREE TIMES DAILY WITH MEALS 15 mL 1   Insulin Pen Needle (BD PEN  NEEDLE NANO U/F) 32G X 4 MM MISC USE EVERY DAY 100 each 4   loratadine (CLARITIN) 10 MG tablet Take 1 tablet by mouth daily. 30 tablet 10   lubiprostone (AMITIZA) 8 MCG capsule TAKE 1 CAPSULE (8 MCG TOTAL) BY MOUTH 2 (TWO) TIMES DAILY WITH A MEAL. SWALLOW THE MEDICATION WHOLE. DO NOT BREAK OR CHEW THE MEDICATION. 180 capsule 1   metFORMIN (GLUCOPHAGE) 1000 MG tablet Take 1 tablet by mouth twice daily with a meal. 60 tablet 0   methocarbamol (ROBAXIN) 500 MG tablet Take 500 mg by mouth 2 (two) times daily.     mometasone (NASONEX) 50 MCG/ACT nasal spray Use 2 sprays into each nostril once daily. 17 each 0   morphine (MS CONTIN) 30 MG 12 hr tablet Take 1 tablet (30 mg total) by mouth every 12 (twelve) hours. Must last 30 days. Do not break tablet 60 tablet 0   morphine (MS  CONTIN) 30 MG 12 hr tablet Take 1 tablet (30 mg total) by mouth every 12 (twelve) hours. Must last 30 days. Do not break tablet 60 tablet 0   morphine (MS CONTIN) 30 MG 12 hr tablet Take 1 tablet (30 mg total) by mouth every 12 (twelve) hours. Must last 30 days. Do not break tablet 60 tablet 0   naloxone (NARCAN) nasal spray 4 mg/0.1 mL Place 1 spray into the nose as needed for up to 365 doses (for opioid-induced respiratory depresssion). In case of emergency (overdose), spray once into each nostril. If no response within 3 minutes, repeat application and call 911. 1 each 0   olmesartan (BENICAR) 40 MG tablet Take 1 tablet by mouth daily. 90 tablet 1   OZEMPIC, 2 MG/DOSE, 8 MG/3ML SOPN Inject into the skin.     pantoprazole (PROTONIX) 40 MG tablet Take 1 tablet by mouth daily. 90 tablet 1   rosuvastatin (CRESTOR) 40 MG tablet Take 1 tablet by mouth daily. 90 tablet 3   sennosides-docusate sodium (SENOKOT-S) 8.6-50 MG tablet Take 1 tablet by mouth daily as needed.      tamsulosin (FLOMAX) 0.4 MG CAPS capsule Take 1 capsule by mouth twice daily. 180 capsule 1   tirzepatide (MOUNJARO) 5 MG/0.5ML Pen INJECT 5 MG INTO THE SKIN ONCE A WEEK. START AFTER YOU COMPLETE THE 4 WEEKS OF THE 2.5 MG DOSE 6 mL 1   TRESIBA FLEXTOUCH 100 UNIT/ML FlexTouch Pen INJECT 18 UNITS INTO THE SKIN DAILY AT 10 PM. 15 mL 3   Wheat Dextrin (BENEFIBER) POWD Take 6 g by mouth 3 (three) times daily before meals. (2 tsp = 6 g) 730 g 2   No current facility-administered medications on file prior to visit.     ROS see history of present illness  Objective  Physical Exam There were no vitals filed for this visit.  BP Readings from Last 3 Encounters:  04/14/22 124/77  03/25/22 126/69  02/10/22 (!) 184/97   Wt Readings from Last 3 Encounters:  04/14/22 265 lb (120.2 kg)  03/25/22 250 lb (113.4 kg)  02/06/22 250 lb (113.4 kg)    Physical Exam   Assessment/Plan: Please see individual problem list.  Type 2 diabetes  mellitus with diabetic polyneuropathy, with long-term current use of insulin (HCC)     Health Maintenance: ***  No follow-ups on file.   [name], MD [facility]

## 2022-06-09 DIAGNOSIS — Z794 Long term (current) use of insulin: Secondary | ICD-10-CM | POA: Diagnosis not present

## 2022-06-09 DIAGNOSIS — E1165 Type 2 diabetes mellitus with hyperglycemia: Secondary | ICD-10-CM | POA: Diagnosis not present

## 2022-06-09 DIAGNOSIS — E118 Type 2 diabetes mellitus with unspecified complications: Secondary | ICD-10-CM | POA: Diagnosis not present

## 2022-06-09 LAB — LIPID PANEL
Cholesterol: 120 mg/dL (ref 0–200)
HDL: 55.7 mg/dL (ref >39.00)
LDL Cholesterol: 47 mg/dL (ref 0–99)
NonHDL: 64.48
Total CHOL/HDL Ratio: 2
Triglycerides: 85 mg/dL (ref 0.0–149.0)
VLDL: 17 mg/dL (ref 0.0–40.0)

## 2022-06-09 LAB — COMPREHENSIVE METABOLIC PANEL WITH GFR
ALT: 35 U/L (ref 0–53)
AST: 35 U/L (ref 0–37)
Albumin: 4.1 g/dL (ref 3.5–5.2)
Alkaline Phosphatase: 73 U/L (ref 39–117)
BUN: 8 mg/dL (ref 6–23)
CO2: 30 meq/L (ref 19–32)
Calcium: 9.7 mg/dL (ref 8.4–10.5)
Chloride: 100 meq/L (ref 96–112)
Creatinine, Ser: 0.94 mg/dL (ref 0.40–1.50)
GFR: 78.85 mL/min
Glucose, Bld: 128 mg/dL — ABNORMAL HIGH (ref 70–99)
Potassium: 3.6 meq/L (ref 3.5–5.1)
Sodium: 140 meq/L (ref 135–145)
Total Bilirubin: 0.4 mg/dL (ref 0.2–1.2)
Total Protein: 7.5 g/dL (ref 6.0–8.3)

## 2022-06-09 LAB — MICROALBUMIN / CREATININE URINE RATIO
Creatinine,U: 79.2 mg/dL
Microalb Creat Ratio: 3.2 mg/g (ref 0.0–30.0)
Microalb, Ur: 2.5 mg/dL — ABNORMAL HIGH (ref 0.0–1.9)

## 2022-06-09 LAB — HEMOGLOBIN A1C: Hgb A1c MFr Bld: 10 % — ABNORMAL HIGH (ref 4.6–6.5)

## 2022-06-10 ENCOUNTER — Other Ambulatory Visit: Payer: Self-pay | Admitting: Family Medicine

## 2022-06-10 DIAGNOSIS — Z794 Long term (current) use of insulin: Secondary | ICD-10-CM

## 2022-06-10 DIAGNOSIS — E1142 Type 2 diabetes mellitus with diabetic polyneuropathy: Secondary | ICD-10-CM

## 2022-06-10 MED ORDER — TIRZEPATIDE 7.5 MG/0.5ML ~~LOC~~ SOAJ
7.5000 mg | SUBCUTANEOUS | 2 refills | Status: DC
Start: 2022-06-10 — End: 2022-07-23

## 2022-06-10 NOTE — Assessment & Plan Note (Signed)
Chronic issue.  Generally well-controlled.  Patient will continue Lexapro 20 mg daily, Wellbutrin 300 mg daily, and Xanax half a milligram twice daily as needed for anxiety.

## 2022-06-10 NOTE — Assessment & Plan Note (Signed)
Chronic issue.  Pain in his leg has improved.  Discussed MRI findings.  He will continue to see his pain specialist.

## 2022-06-10 NOTE — Assessment & Plan Note (Signed)
Chronic issue.  We will refer back for physical therapy to help with this.

## 2022-06-10 NOTE — Assessment & Plan Note (Signed)
Chronic issue.  Likely poorly controlled.  Patient will continue metformin 1000 mg twice daily, Mounjaro 5 mg weekly, Tresiba 18 units daily, Jardiance 25 mg daily, and Humalog 5-7 units 3 times daily with meals.  Check A1c.

## 2022-06-11 ENCOUNTER — Telehealth: Payer: Self-pay | Admitting: Pain Medicine

## 2022-06-11 NOTE — Telephone Encounter (Signed)
PT stated that he can't come in Monday, due to another appt and he can't change that appt. PT wants to see if doctor will write him a prescription for one month. Please give patient a call.

## 2022-06-15 ENCOUNTER — Encounter: Payer: 59 | Admitting: Pain Medicine

## 2022-06-16 ENCOUNTER — Telehealth: Payer: Self-pay

## 2022-06-16 NOTE — Telephone Encounter (Signed)
The patient was notified to bring the medication to the appointment.

## 2022-06-16 NOTE — Telephone Encounter (Signed)
The patient has a question regarding an OTC medicine and he wants to run it by Dr. Laban Emperor before purchasing this because it has CBD in it. It is for diabetes and he feels that it will help him. He will wait for DR. Laban Emperor to give him an answer on it before he buys it.

## 2022-06-24 ENCOUNTER — Ambulatory Visit: Payer: 59

## 2022-06-24 ENCOUNTER — Other Ambulatory Visit: Payer: Self-pay | Admitting: Family Medicine

## 2022-06-30 ENCOUNTER — Other Ambulatory Visit: Payer: Self-pay | Admitting: Family Medicine

## 2022-06-30 DIAGNOSIS — E1142 Type 2 diabetes mellitus with diabetic polyneuropathy: Secondary | ICD-10-CM

## 2022-07-01 ENCOUNTER — Encounter: Payer: 59 | Admitting: Pain Medicine

## 2022-07-01 ENCOUNTER — Ambulatory Visit: Payer: 59 | Attending: Family Medicine

## 2022-07-06 ENCOUNTER — Ambulatory Visit: Payer: 59 | Admitting: Physical Therapy

## 2022-07-08 ENCOUNTER — Ambulatory Visit: Payer: 59 | Admitting: Physical Therapy

## 2022-07-08 ENCOUNTER — Encounter: Payer: Self-pay | Admitting: Pain Medicine

## 2022-07-08 ENCOUNTER — Ambulatory Visit: Payer: 59 | Attending: Pain Medicine | Admitting: Pain Medicine

## 2022-07-08 VITALS — BP 130/83 | HR 81 | Temp 97.3°F | Ht 66.0 in | Wt 250.0 lb

## 2022-07-08 DIAGNOSIS — Z96641 Presence of right artificial hip joint: Secondary | ICD-10-CM | POA: Diagnosis not present

## 2022-07-08 DIAGNOSIS — M25562 Pain in left knee: Secondary | ICD-10-CM

## 2022-07-08 DIAGNOSIS — M79604 Pain in right leg: Secondary | ICD-10-CM

## 2022-07-08 DIAGNOSIS — M542 Cervicalgia: Secondary | ICD-10-CM | POA: Diagnosis not present

## 2022-07-08 DIAGNOSIS — M5030 Other cervical disc degeneration, unspecified cervical region: Secondary | ICD-10-CM

## 2022-07-08 DIAGNOSIS — M25561 Pain in right knee: Secondary | ICD-10-CM

## 2022-07-08 DIAGNOSIS — M545 Low back pain, unspecified: Secondary | ICD-10-CM | POA: Diagnosis not present

## 2022-07-08 DIAGNOSIS — M5136 Other intervertebral disc degeneration, lumbar region: Secondary | ICD-10-CM

## 2022-07-08 DIAGNOSIS — Z79899 Other long term (current) drug therapy: Secondary | ICD-10-CM | POA: Diagnosis not present

## 2022-07-08 DIAGNOSIS — M79605 Pain in left leg: Secondary | ICD-10-CM | POA: Diagnosis not present

## 2022-07-08 DIAGNOSIS — Z79891 Long term (current) use of opiate analgesic: Secondary | ICD-10-CM

## 2022-07-08 DIAGNOSIS — G894 Chronic pain syndrome: Secondary | ICD-10-CM | POA: Diagnosis not present

## 2022-07-08 DIAGNOSIS — M47816 Spondylosis without myelopathy or radiculopathy, lumbar region: Secondary | ICD-10-CM | POA: Diagnosis not present

## 2022-07-08 DIAGNOSIS — M25551 Pain in right hip: Secondary | ICD-10-CM

## 2022-07-08 DIAGNOSIS — G8929 Other chronic pain: Secondary | ICD-10-CM | POA: Insufficient documentation

## 2022-07-08 MED ORDER — MORPHINE SULFATE ER 30 MG PO TBCR
30.0000 mg | EXTENDED_RELEASE_TABLET | Freq: Two times a day (BID) | ORAL | 0 refills | Status: DC
Start: 2022-07-08 — End: 2022-12-28

## 2022-07-08 MED ORDER — MORPHINE SULFATE ER 30 MG PO TBCR
30.0000 mg | EXTENDED_RELEASE_TABLET | Freq: Two times a day (BID) | ORAL | 0 refills | Status: DC
Start: 2022-07-08 — End: 2022-07-08

## 2022-07-08 MED ORDER — NALOXONE HCL 4 MG/0.1ML NA LIQD
1.0000 | NASAL | 0 refills | Status: DC | PRN
Start: 2022-07-08 — End: 2022-08-03

## 2022-07-08 NOTE — Patient Instructions (Addendum)
_______________________________________________________________________  Medication Rules  Purpose: To inform patients, and their family members, of our medication rules and regulations.  Applies to: All patients receiving prescriptions from our practice (written or electronic).  Pharmacy of record: This is the pharmacy where your electronic prescriptions will be sent. Make sure we have the correct one.  Electronic prescriptions: In compliance with the [facility] Strengthen Opioid Misuse Prevention (STOP) Act of 2017 (Session Conni Elliot [phone]), effective February 02, 2018, all controlled substances must be electronically prescribed. Written prescriptions, faxing, or calling prescriptions to a pharmacy will no longer be done.  Prescription refills: These will be provided only during in-person appointments. No medications will be renewed without a "face-to-face" evaluation with your provider. Applies to all prescriptions.  NOTE: The following applies primarily to controlled substances (Opioid* Pain Medications).   Type of encounter (visit): For patients receiving controlled substances, face-to-face visits are required. (Not an option and not up to the patient.)  Patient's responsibilities: Pain Pills: Bring all pain pills to every appointment (except for procedure appointments). Pill Bottles: Bring pills in original pharmacy bottle. Bring bottle, even if empty. Always bring the bottle of the most recent fill.  Medication refills: You are responsible for knowing and keeping track of what medications you are taking and when is it that you will need a refill. The day before your appointment: write a list of all prescriptions that need to be refilled. The day of the appointment: give the list to the admitting nurse. Prescriptions will be written only during appointments. No prescriptions will be written on procedure days. If you forget a medication: it will not be "Called in", "Faxed", or  "electronically sent". You will need to get another appointment to get these prescribed. No early refills. Do not call asking to have your prescription filled early. Partial  or short prescriptions: Occasionally your pharmacy may not have enough pills to fill your prescription.  NEVER ACCEPT a partial fill or a prescription that is short of the total amount of pills that you were prescribed.  With controlled substances the law allows 72 hours for the pharmacy to complete the prescription.  If the prescription is not completed within 72 hours, the pharmacist will require a new prescription to be written. This means that you will be short on your medicine and we WILL NOT send another prescription to complete your original prescription.  Instead, request the pharmacy to send a carrier to a nearby branch to get enough medication to provide you with your full prescription. Prescription Accuracy: You are responsible for carefully inspecting your prescriptions before leaving our office. Have the discharge nurse carefully go over each prescription with you, before taking them home. Make sure that your name is accurately spelled, that your address is correct. Check the name and dose of your medication to make sure it is accurate. Check the number of pills, and the written instructions to make sure they are clear and accurate. Make sure that you are given enough medication to last until your next medication refill appointment. Taking Medication: Take medication as prescribed. When it comes to controlled substances, taking less pills or less frequently than prescribed is permitted and encouraged. Never take more pills than instructed. Never take the medication more frequently than prescribed.  Inform other Doctors: Always inform, all of your healthcare providers, of all the medications you take. Pain Medication from other Providers: You are not allowed to accept any additional pain medication from any other Doctor or  Healthcare provider. There are  two exceptions to this rule. (see below) In the event that you require additional pain medication, you are responsible for notifying us, as stated below. Cough Medicine: Often these contain an opioid, such as codeine or hydrocodone. Never accept or take cough medicine containing these opioids if you are already taking an opioid* medication. The combination may cause respiratory failure and death. Medication Agreement: You are responsible for carefully reading and following our Medication Agreement. This must be signed before receiving any prescriptions from our practice. Safely store a copy of your signed Agreement. Violations to the Agreement will result in no further prescriptions. (Additional copies of our Medication Agreement are available upon request.) Laws, Rules, & Regulations: All patients are expected to follow all [address] and [employer], Rules,  [place]. Ignorance of the Laws does not constitute a valid excuse.  Illegal drugs and Controlled Substances: The use of illegal substances (including, but not limited to marijuana and its derivatives) and/or the illegal use of any controlled substances is strictly prohibited. Violation of this rule may result in the immediate and permanent discontinuation of any and all prescriptions being written by our practice. The use of any illegal substances is prohibited. Adopted CDC guidelines & recommendations: Target dosing levels will be at or below 60 MME/day. Use of benzodiazepines** is not recommended.  Exceptions: There are only two exceptions to the rule of not receiving pain medications from other Healthcare Providers. Exception #1 (Emergencies): In the event of an emergency (i.e.: accident requiring emergency care), you are allowed to receive additional pain medication. However, you are responsible for: As soon as you are able, call our office [phone], at any time of the day or night, and leave a  message stating your name, the date and nature of the emergency, and the name and dose of the medication prescribed. In the event that your call is answered by a member of our staff, make sure to document and save the date, time, and the name of the person that took your information.  Exception #2 (Planned Surgery): In the event that you are scheduled by another doctor or dentist to have any type of surgery or procedure, you are allowed (for a period no longer than 30 days), to receive additional pain medication, for the acute post-op pain. However, in this case, you are responsible for picking up a copy of our "Post-op Pain Management for Surgeons" handout, and giving it to your surgeon or dentist. This document is available at our office, and does not require an appointment to obtain it. Simply go to our office during business hours (Monday-Thursday from 8:00 AM to 4:00 PM) (Friday 8:00 AM to 12:00 Noon) or if you have a scheduled appointment with [place], prior to your surgery, and ask for it by name. In addition, you are responsible for: calling our office [phone], at any time of the day or night, and leaving a message stating your name, name of your surgeon, type of surgery, and date of procedure or surgery. Failure to comply with your responsibilities may result in termination of therapy involving the controlled substances. Medication Agreement Violation. Following the above rules, including your responsibilities will help you in avoiding a Medication Agreement Violation ("Breaking your Pain Medication Contract").  Consequences:  Not following the above rules may result in permanent discontinuation of medication prescription therapy.  *Opioid medications include: morphine, codeine, oxycodone, oxymorphone, hydrocodone, hydromorphone, meperidine, tramadol, tapentadol, buprenorphine, fentanyl, methadone. **Benzodiazepine medications include: diazepam (Valium), alprazolam (Xanax), clonazepam (Klonopine),  lorazepam (  Ativan), clorazepate (Tranxene), chlordiazepoxide (Librium), estazolam (Prosom), oxazepam (Serax), temazepam (Restoril), triazolam (Halcion) (Last updated: 11/25/2021) ______________________________________________________________________    ______________________________________________________________________  Medication Recommendations and Reminders  Applies to: All patients receiving prescriptions (written and/or electronic).  Medication Rules & Regulations: You are responsible for reading, knowing, and following our "Medication Rules" document. These exist for your safety and that of others. They are not flexible and neither are we. Dismissing or ignoring them is an act of "non-compliance" that may result in complete and irreversible termination of such medication therapy. For safety reasons, "non-compliance" will not be tolerated. As with the [place] fundamental legal principle of "ignorance of the law is no defense", we will accept no excuses for not having read and knowing the content of documents provided to you by our practice.  Pharmacy of record:  Definition: This is the pharmacy where your electronic prescriptions will be sent.  We do not endorse any particular pharmacy. It is up to you and your insurance to decide what pharmacy to use.  We do not restrict you in your choice of pharmacy. However, once we write for your prescriptions, we will NOT be re-sending more prescriptions to fix restricted supply problems created by your pharmacy, or your insurance.  The pharmacy listed in the electronic medical record should be the one where you want electronic prescriptions to be sent. If you choose to change pharmacy, simply notify our nursing staff. Changes will be made only during your regular appointments and not over the phone.  Recommendations: Keep all of your pain medications in a safe place, under lock and key, even if you live alone. We will NOT replace lost, stolen, or  damaged medication. We do not accept "Police Reports" as proof of medications having been stolen. After you fill your prescription, take 1 week's worth of pills and put them away in a safe place. You should keep a separate, properly labeled bottle for this purpose. The remainder should be kept in the original bottle. Use this as your primary supply, until it runs out. Once it's gone, then you know that you have 1 week's worth of medicine, and it is time to come in for a prescription refill. If you do this correctly, it is unlikely that you will ever run out of medicine. To make sure that the above recommendation works, it is very important that you make sure your medication refill appointments are scheduled at least 1 week before you run out of medicine. To do this in an effective manner, make sure that you do not leave the office without scheduling your next medication management appointment. Always ask the nursing staff to show you in your prescription , when your medication will be running out. Then arrange for the receptionist to get you a return appointment, at least 7 days before you run out of medicine. Do not wait until you have 1 or 2 pills left, to come in. This is very poor planning and does not take into consideration that we may need to cancel appointments due to bad weather, sickness, or emergencies affecting our staff. DO NOT ACCEPT A "Partial Fill": If for any reason your pharmacy does not have enough pills/tablets to completely fill or refill your prescription, do not allow for a "partial fill". The law allows the pharmacy to complete that prescription within 72 hours, without requiring a new prescription. If they do not fill the rest of your prescription within those 72 hours, you will need a separate prescription to fill the remaining  amount, which we will NOT provide. If the reason for the partial fill is your insurance, you will need to talk to the pharmacist about payment alternatives for  the remaining tablets, but again, DO NOT ACCEPT A PARTIAL FILL, unless you can trust your pharmacist to obtain the remainder of the pills within 72 hours.  Prescription refills and/or changes in medication(s):  Prescription refills, and/or changes in dose or medication, will be conducted only during scheduled medication management appointments. (Applies to both, written and electronic prescriptions.) No refills on procedure days. No medication will be changed or started on procedure days. No changes, adjustments, and/or refills will be conducted on a procedure day. Doing so will interfere with the diagnostic portion of the procedure. No phone refills. No medications will be "called into the pharmacy". No Fax refills. No weekend refills. No Holliday refills. No after hours refills.  Remember:  Business hours are:  Monday to Thursday 8:00 AM to 4:00 PM Provider's Schedule: Delano Metz, MD - Appointments are:  Medication management: Monday and Wednesday 8:00 AM to 4:00 PM Procedure day: Tuesday and Thursday 7:30 AM to 4:00 PM Edward Jolly, MD - Appointments are:  Medication management: Tuesday and Thursday 8:00 AM to 4:00 PM Procedure day: Monday and Wednesday 7:30 AM to 4:00 PM (Last update: 11/25/2021) ______________________________________________________________________   ____________________________________________________________________________________________  Naloxone Nasal Spray  Why am I receiving this medication? [place] STOP ACT requires that all patients taking high dose opioids or at risk of opioids respiratory depression, be prescribed an opioid reversal agent, such as Naloxone (AKA: Narcan).  What is this medication? NALOXONE (nal OX one) treats opioid overdose, which causes slow or shallow breathing, severe drowsiness, or trouble staying awake. Call emergency services after using this medication. You may need additional treatment. Naloxone works by  reversing the effects of opioids. It belongs to a group of medications called opioid blockers.  COMMON BRAND NAME(S): Kloxxado, Narcan  What should I tell my care team before I take this medication? They need to know if you have any of these conditions: Heart disease Substance use disorder An unusual or allergic reaction to naloxone, other medications, foods, dyes, or preservatives Pregnant or trying to get pregnant Breast-feeding  When to use this medication? This medication is to be used for the treatment of respiratory depression (less than 8 breaths per minute) secondary to opioid overdose.   How to use this medication? This medication is for use in the nose. Lay the person on their back. Support their neck with your hand and allow the head to tilt back before giving the medication. The nasal spray should be given into 1 nostril. After giving the medication, move the person onto their side. Do not remove or test the nasal spray until ready to use. Get emergency medical help right away after giving the first dose of this medication, even if the person wakes up. You should be familiar with how to recognize the signs and symptoms of a narcotic overdose. If more doses are needed, give the additional dose in the other nostril. Talk to your care team about the use of this medication in children. While this medication may be prescribed for children as young as newborns for selected conditions, precautions do apply.  Naloxone Overdosage: If you think you have taken too much of this medicine contact a poison control center or emergency room at once.  NOTE: This medicine is only for you. Do not share this medicine with others.  What if I miss  a dose? This does not apply.  What may interact with this medication? This is only used during an emergency. No interactions are expected during emergency use. This list may not describe all possible interactions. Give your health care provider a list of  all the medicines, herbs, non-prescription drugs, or dietary supplements you use. Also tell them if you smoke, drink alcohol, or use illegal drugs. Some items may interact with your medicine.  What should I watch for while using this medication? Keep this medication ready for use in the case of an opioid overdose. Make sure that you have the phone number of your care team and local hospital ready. You may need to have additional doses of this medication. Each nasal spray contains a single dose. Some emergencies may require additional doses. After use, bring the treated person to the nearest hospital or call 911. Make sure the treating care team knows that the person has received a dose of this medication. You will receive additional instructions on what to do during and after use of this medication before an emergency occurs.  What side effects may I notice from receiving this medication? Side effects that you should report to your care team as soon as possible: Allergic reactions--skin rash, itching, hives, swelling of the face, lips, tongue, or throat Side effects that usually do not require medical attention (report these to your care team if they continue or are bothersome): Constipation Dryness or irritation inside the nose Headache Increase in blood pressure Muscle spasms Stuffy nose Toothache This list may not describe all possible side effects. Call your doctor for medical advice about side effects. You may report side effects to FDA at 1-800-FDA-1088.  Where should I keep my medication? Because this is an emergency medication, you should keep it with you at all times.  Keep out of the reach of children and pets. Store between 20 and 25 degrees C (68 and 77 degrees F). Do not freeze. Throw away any unused medication after the expiration date. Keep in original box until ready to use.  NOTE: This sheet is a summary. It may not cover all possible information. If you have questions about  this medicine, talk to your doctor, pharmacist, or health care provider.   2023 Elsevier/Gold Standard (2020-09-27 00:00:00)  ____________________________________________________________________________________________   ____________________________________________________________________________________________  Transfer of Pain Medication between Pharmacies  Re: 2023 DEA Clarification on existing regulation  Published on DEA Website: October 03, 2021  Title: Revised Regulation Allows DEA-Registered Pharmacies to Electrical engineer Prescriptions at a Patient's Request DEA Headquarters Division - [employer]  "Patients now have the ability to request their electronic prescription be transferred to another pharmacy without having to go back to their practitioner to initiate the request. This revised regulation went into effect on Monday, September 29, 2021.     At a patient's request, a DEA-registered retail pharmacy can now transfer an electronic prescription for a controlled substance (schedules II-V) to another DEA-registered retail pharmacy. Prior to this change, patients would have to go through their practitioner to cancel their prescription and have it re-issued to a different pharmacy. The process was taxing and time consuming for both patients and practitioners.    The Drug Enforcement Administration [facility]) published its intent to revise the process for transferring electronic prescriptions on December 22, 2019.  The final rule was published in the federal register on August 28, 2021 and went into effect 30 days later.  Under the final rule, a prescription can only be transferred  once between pharmacies, and only if allowed under existing state or other applicable law. The prescription must remain in its electronic form; may not be altered in any way; and the transfer must be communicated directly between two licensed pharmacists. It's important to note, any authorized  refills transfer with the original prescription, which means the entire prescription will be filled at the same pharmacy."    REFERENCES: 1. DEA website announcement HugeHand.is  2. Department of Justice website  CheapWipes.at.pdf  3. DEPARTMENT OF JUSTICE Drug Enforcement Administration 21 CFR Part 1306 [Docket No. [id number]] RIN 1117-AB64 "Transfer of Electronic Prescriptions for Schedules II-V Controlled Substances Between Pharmacies for Initial Filling"  ____________________________________________________________________________________________     ____________________________________________________________________________________________  Opioid Pain Medication Update  To: All patients taking opioid pain medications. (I.e.: hydrocodone, hydromorphone, oxycodone, oxymorphone, morphine, codeine, methadone, tapentadol, tramadol, buprenorphine, fentanyl, etc.)  Re: Updated review of side effects and adverse reactions of opioid analgesics, as well as new information about long term effects of this class of medications.  Direct risks of long-term opioid therapy are not limited to opioid addiction and overdose. Potential medical risks include serious fractures, breathing problems during sleep, hyperalgesia, immunosuppression, chronic constipation, bowel obstruction, myocardial infarction, and tooth decay secondary to xerostomia.  Unpredictable adverse effects that can occur even if you take your medication correctly: Cognitive impairment, respiratory depression, and death. Most people think that if they take their medication "correctly", and "as instructed", that they will be safe. Nothing could be farther from the truth. In reality, a significant amount of recorded deaths associated with the use of opioids has occurred in individuals that  had taken the medication for a long time, and were taking their medication correctly. The following are examples of how this can happen: Patient taking his/her medication for a long time, as instructed, without any side effects, is given a certain antibiotic or another unrelated medication, which in turn triggers a "Drug-to-drug interaction" leading to disorientation, cognitive impairment, impaired reflexes, respiratory depression or an untoward event leading to serious bodily harm or injury, including death.  Patient taking his/her medication for a long time, as instructed, without any side effects, develops an acute impairment of liver and/or kidney function. This will lead to a rapid inability of the body to breakdown and eliminate their pain medication, which will result in effects similar to an "overdose", but with the same medicine and dose that they had always taken. This again may lead to disorientation, cognitive impairment, impaired reflexes, respiratory depression or an untoward event leading to serious bodily harm or injury, including death.  A similar problem will occur with patients as they grow older and their liver and kidney function begins to decrease as part of the aging process.  Background information: Historically, the original case for using long-term opioid therapy to treat chronic noncancer pain was based on safety assumptions that subsequent experience has called into question. In 1996, the American Pain Society and the American Academy of Pain Medicine issued a consensus statement supporting long-term opioid therapy. This statement acknowledged the dangers of opioid prescribing but concluded that the risk for addiction was low; respiratory depression induced by opioids was short-lived, occurred mainly in opioid-naive patients, and was antagonized by pain; tolerance was not a common problem; and efforts to control diversion should not constrain opioid prescribing. This has now proven  to be wrong. Experience regarding the risks for opioid addiction, misuse, and overdose in community practice has failed to support these assumptions.  According to the Centers for Disease  Control and Prevention, fatal overdoses involving opioid analgesics have increased sharply over the past decade. Currently, more than 96,700 people die from drug overdoses every year. Opioids are a factor in 7 out of every 10 overdose deaths. Deaths from drug overdose have surpassed motor vehicle accidents as the leading cause of death for individuals between the ages of 14 and 65.  Clinical data suggest that neuroendocrine dysfunction may be very common in both men and women, potentially causing hypogonadism, erectile dysfunction, infertility, decreased libido, osteoporosis, and depression. Recent studies linked higher opioid dose to increased opioid-related mortality. Controlled observational studies reported that long-term opioid therapy may be associated with increased risk for cardiovascular events. Subsequent meta-analysis concluded that the safety of long-term opioid therapy in elderly patients has not been proven.   Side Effects and adverse reactions: Common side effects: Drowsiness (sedation). Dizziness. Nausea and vomiting. Constipation. Physical dependence -- Dependence often manifests with withdrawal symptoms when opioids are discontinued or decreased. Tolerance -- As you take repeated doses of opioids, you require increased medication to experience the same effect of pain relief. Respiratory depression -- This can occur in healthy people, especially with higher doses. However, people with COPD, asthma or other lung conditions may be even more susceptible to fatal respiratory impairment.  Uncommon side effects: An increased sensitivity to feeling pain and extreme response to pain (hyperalgesia). Chronic use of opioids can lead to this. Delayed gastric emptying (the process by which the contents of your  stomach are moved into your small intestine). Muscle rigidity. Immune system and hormonal dysfunction. Quick, involuntary muscle jerks (myoclonus). Arrhythmia. Itchy skin (pruritus). Dry mouth (xerostomia).  Long-term side effects: Chronic constipation. Sleep-disordered breathing (SDB). Increased risk of bone fractures. Hypothalamic-pituitary-adrenal dysregulation. Increased risk of overdose.  RISKS: Fractures and Falls:  Opioids increase the risk and incidence of falls. This is of particular importance in elderly patients.  Endocrine System:  Long-term administration is associated with endocrine abnormalities (endocrinopathies). (Also known as Opioid-induced Endocrinopathy) Influences on both the hypothalamic-pituitary-adrenal axis?and the hypothalamic-pituitary-gonadal axis have been demonstrated with consequent hypogonadism and adrenal insufficiency in both sexes. Hypogonadism and decreased levels of dehydroepiandrosterone sulfate have been reported in men and women. Endocrine effects include: Amenorrhoea in women (abnormal absence of menstruation) Reduced libido in both sexes Decreased sexual function Erectile dysfunction in men Hypogonadisms (decreased testicular function with shrinkage of testicles) Infertility Depression and fatigue Loss of muscle mass Anxiety Depression Immune suppression Hyperalgesia Weight gain Anemia Osteoporosis Patients (particularly women of childbearing age) should avoid opioids. There is insufficient evidence to recommend routine monitoring of asymptomatic patients taking opioids in the long-term for hormonal deficiencies.  Immune System: Human studies have demonstrated that opioids have an immunomodulating effect. These effects are mediated via opioid receptors both on immune effector cells and in the central nervous system. Opioids have been demonstrated to have adverse effects on antimicrobial response and anti-tumour  surveillance. Buprenorphine has been demonstrated to have no impact on immune function.  Opioid Induced Hyperalgesia: Human studies have demonstrated that prolonged use of opioids can lead to a state of abnormal pain sensitivity, sometimes called opioid induced hyperalgesia (OIH). Opioid induced hyperalgesia is not usually seen in the absence of tolerance to opioid analgesia. Clinically, hyperalgesia may be diagnosed if the patient on long-term opioid therapy presents with increased pain. This might be qualitatively and anatomically distinct from pain related to disease progression or to breakthrough pain resulting from development of opioid tolerance. Pain associated with hyperalgesia tends to be more diffuse than the pre-existing pain  and less defined in quality. Management of opioid induced hyperalgesia requires opioid dose reduction.  Cancer: Chronic opioid therapy has been associated with an increased risk of cancer among noncancer patients with chronic pain. This association was more evident in chronic strong opioid users. Chronic opioid consumption causes significant pathological changes in the small intestine and colon. Epidemiological studies have found that there is a link between opium dependence and initiation of gastrointestinal cancers. Cancer is the second leading cause of death after cardiovascular disease. Chronic use of opioids can cause multiple conditions such as GERD, immunosuppression and renal damage as well as carcinogenic effects, which are associated with the incidence of cancers.   Mortality: Long-term opioid use has been associated with increased mortality among patients with chronic non-cancer pain (CNCP).  Prescription of long-acting opioids for chronic noncancer pain was associated with a significantly increased risk of all-cause mortality, including deaths from causes other than overdose.  Reference: Von Korff M, Kolodny A, Deyo RA, Chou R. Long-term opioid therapy  reconsidered. Ann Intern Med. 2011 Sep 6;155(5):325-8. doi: 10.7326/[id number]. PMID: 40981191; PMCID: [id number]. Randon Goldsmith, Hayward RA, Dunn KM, [place] KP. Risk of adverse events in patients prescribed long-term opioids: A cohort study in the [place] [facility] [employer]. Eur J Pain. 2019 May;23(5):908-922. doi: 10.1002/ejp.1357. Epub 2019 Jan 31. PMID: 08657846. Colameco S, Coren JS, Ciervo CA. Continuous opioid treatment for chronic noncancer pain: a time for moderation in prescribing. Postgrad Med. 2009 Jul;121(4):61-6. doi: 10.3810/pgm.2009.07.2032. PMID: 96295284. William Hamburger RN, Hernandez SD, Blazina I, Cristopher Peru, Bougatsos C, Deyo RA. The effectiveness and risks of long-term opioid therapy for chronic pain: a systematic review for a [employer] to [employer]. Ann Intern Med. 2015 Feb 17;162(4):276-86. doi: 10.7326/M14-2559. PMID: 13244010. [name] [facility], Makuc DM. NCHS Data Brief No. 22. [place]: Centers for Disease Control and Prevention; 2009. Sep, Increase in Fatal Poisonings Involving Opioid Analgesics in the [place], 1999-2006. Song IA, Choi HR, Oh TK. Long-term opioid use and mortality in patients with chronic non-cancer pain: Ten-year follow-up study in [place] from 2010 through 2019. EClinicalMedicine. 2022 Jul 18;51:101558. doi: 10.1016/j.eclinm.2022.272536. PMID: 64403474; PMCID: [id number]. Huser, W., Schubert, T., Vogelmann, T. et al. All-cause mortality in patients with long-term opioid therapy compared with non-opioid analgesics for chronic non-cancer pain: a database study. BMC Med 18, 162 (2020). http://lester.info/ Rashidian H, Karie Kirks, Malekzadeh R, Haghdoost AA. An Ecological Study of the Association between Opiate Use and Incidence of Cancers. Addict Health. 2016 Fall;8(4):252-260. PMID: 43329518; PMCID: [id number].  Our  Goal: Our goal is to control your pain with means other than the use of opioid pain medications.  Our Recommendation: Talk to your physician about coming off of these medications. We can assist you with the tapering down and stopping these medicines. Based on the new information, even if you cannot completely stop the medication, a decrease in the dose may be associated with a lesser risk. Ask for other means of controlling the pain. Decrease or eliminate those factors that significantly contribute to your pain such as smoking, obesity, and a diet heavily tilted towards "inflammatory" nutrients.  Last Updated: 04/01/2022   ____________________________________________________________________________________________

## 2022-07-08 NOTE — Progress Notes (Signed)
Nursing Pain Medication Assessment:  Safety precautions to be maintained throughout the outpatient stay will include: orient to surroundings, keep bed in low position, maintain call bell within reach at all times, provide assistance with transfer out of bed and ambulation.  Medication Inspection Compliance: Alan Schmidt did not comply with our request to bring his pills to be counted. He was reminded that bringing the medication bottles, even when empty, is a requirement.  Medication: None brought in. Pill/Patch Count: None available to be counted. Bottle Appearance: No container available. Did not bring bottle(s) to appointment. Filled Date: N/A Last Medication intake:  Ran out of medicine more than 48 hours ago   Safety precautions to be maintained throughout the outpatient stay will include: orient to surroundings, keep bed in low position, maintain call bell within reach at all times, provide assistance with transfer out of bed and ambulation.    Home health nurse throw away his empty bottle.

## 2022-07-08 NOTE — Progress Notes (Signed)
PROVIDER NOTE: Information contained herein reflects review and annotations entered in association with encounter. Interpretation of such information and data should be left to medically-trained personnel. Information provided to patient can be located elsewhere in the medical record under "Patient Instructions". Document created using STT-dictation technology, any transcriptional errors that may result from process are unintentional.    Patient: Alan Schmidt  Service Category: E/M  Provider: Oswaldo Done, MD  DOB: [birth year]  DOS: 07/08/2022  Referring Provider: Glori Luis, MD  MRN: [id number]  Specialty: Interventional Pain Management  PCP: Glori Luis, MD  Type: Established Patient  Setting: Ambulatory outpatient    Location: Office  Delivery: Face-to-face     HPI  Alan Schmidt, a 77 y.o. year old male, is here today because of his Chronic pain syndrome [G89.4]. Alan Schmidt primary complain today is Back Pain (lower)  Pertinent problems: Alan Schmidt has Chronic low back pain (Bilateral) (R>L) w/ sciatica (Bilateral); Lumbar facet syndrome (Bilateral) (R>L); Lumbar spondylosis; Diabetic peripheral neuropathy (HCC); Chronic neck pain (midline over the C7 spinous processes) (L>R); Neurogenic pain; Neuropathic pain; Chronic lower extremity pain (2ry area of Pain) (Bilateral) (R>L); 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) (Right L5 dermatome); History of total hip replacement (Right); Chronic hip pain (Right); Abnormal nerve conduction studies (severe bilateral lower extremity polyneuropathy); Chronic shoulder pain (Bilateral) (R>L); Occipital neuralgia (Left); Cervicogenic headache (Left); Chronic shoulder radicular pain (Left); Chronic cervical radicular pain (Left); Chronic pain syndrome; Lumbar facet joint osteoarthritis (Bilateral); Chronic headache; Chronic tension-type headache, intractable; Coccygodynia; DDD (degenerative disc disease), lumbar; Chronic  musculoskeletal pain; Chronic knee pain (Bilateral); Thumb pain (Right); 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 Pain Assessment: Severity of Chronic pain is reported as a 5 /10. Location: Back Left/pain radiaties down left leg. Onset: More than a month ago. Quality: Aching, Burning, Throbbing, Stabbing, Discomfort, Constant. Timing: Constant. Modifying factor(s): Meds and laying down. Vitals:  height is 5\' 6"  (1.676 m) and weight is 250 lb (113.4 kg). His temperature is 97.3 F (36.3 C) (abnormal). His blood pressure is 130/83 and his pulse is 81. His oxygen saturation is 96%.  BMI: Estimated body mass index is 40.35 kg/m as calculated from the following:   Height as of this encounter: 5\' 6"  (1.676 m).   Weight as of this encounter: 250 lb (113.4 kg). Last encounter: 04/28/2022. Last procedure: 04/14/2022.  Reason for encounter: medication management.  The patient indicates doing well with the current medication regimen. No adverse reactions or side effects reported to the medications.   Routine UDS ordered today.   RTCB: 10/06/2022   Pharmacotherapy Assessment  Analgesic: Morphine ER (MS Contin) 30 mg, 1 tab p.o. twice daily (60 mg/day).  (Previously on oxycodone IR 10 mg every 6 hours (40 mg/day)) MME/day: 60 mg/day.   Monitoring: [place] PMP: PDMP reviewed during this encounter.       Pharmacotherapy: No side-effects or adverse reactions reported. Compliance: No problems identified. Effectiveness: Clinically acceptable.  Brigitte Pulse, RN  07/08/2022 12:00 PM   Sign when Signing Visit Nursing Pain Medication Assessment:  Safety precautions to be maintained throughout the outpatient stay will include: orient to surroundings, keep bed  in low position, maintain call bell within reach at all times, provide assistance with transfer out of bed and ambulation.  Medication Inspection Compliance: Alan Schmidt did not comply with our request to bring his pills to be counted. He was reminded that bringing the medication bottles, even when empty, is a requirement.  Medication: None brought in. Pill/Patch Count: None available to be counted. Bottle Appearance: No container available. Did not bring bottle(s) to appointment. Filled Date: N/A Last Medication intake:  Ran out of medicine more than 48 hours ago   Safety precautions to be maintained throughout the outpatient stay will include: orient to surroundings, keep bed in low position, maintain call bell within reach at all times, provide assistance with transfer out of bed and ambulation.    Home health nurse throw away his empty bottle.    No results found for: "CBDTHCR" No results found for: "D8THCCBX" No results found for: "D9THCCBX"  UDS:  Summary  Date Value Ref Range Status  09/29/2021 Note  Final    Comment:    ==================================================================== ToxASSU[employer] Select 13 (MW) ==================================================================== Test                             Result       Flag       Units  Drug Present and Declared for Prescription Verification   Alprazolam                     109          EXPECTED   ng/mg creat   Alpha-hydroxyalprazolam        314          EXPECTED   ng/mg creat    Source of alprazolam is a scheduled prescription medication. Alpha-    hydroxyalprazolam is an expected metabolite of alprazolam.    Morphine                       >9709        EXPECTED   ng/mg creat   Normorphine                    290          EXPECTED   ng/mg creat     Potential sources of large amounts of morphine in the absence of    codeine include administration of morphine or use of heroin.     Normorphine is an expected metabolite of morphine.    Hydromorphone                  236          EXPECTED   ng/mg creat    Hydromorphone may be present as a metabolite of morphine;    concentrations of hydromorphone rarely exceed 5% of the morphine    concentration when this is the source of hydromorphone.  ==================================================================== Test                      Result    Flag   Units      Ref Range   Creatinine              103              mg/dL      >=81 ==================================================================== Declared Medications:  The flagging and interpretation on this report are based on the  following declared medications.  Unexpected results may arise from  inaccuracies in the declared medications.   **Note: The testing scope of this panel includes these medications:   Alprazolam (Xanax)  Morphine (MS Contin)   **Note: The testing scope of this panel does not include the  following reported medications:   Acetaminophen (Tylenol)  Aspirin  Bupropion (Wellbutrin)  Carvedilol (Coreg)  Desoximetasone (Topicort)  Empagliflozin (Jardiance)  Epinephrine (EpiPen)  Escitalopram (Lexapro)  Gabapentin (Neurontin)  Hydrochlorothiazide (Hydrodiuril)  Insulin Evaristo Bury)  Loratadine (Claritin)  Lubiprostone (Amitiza)  Metformin (Glucophage)  Methocarbamol (Robaxin)  Mometasone (Nasonex)  Naloxone (Narcan)  Olmesartan (Benicar)  Pantoprazole (Protonix)  Rosuvastatin (Crestor)  Sennosides (Senokot)  Supplement (Fiber)  Tamsulosin (Flomax)  Tirzepatide (Mounjaro) ==================================================================== For clinical consultation, please call [phone]. ====================================================================       ROS  Constitutional: Denies  any fever or chills Gastrointestinal: No reported hemesis, hematochezia, vomiting, or acute GI distress Musculoskeletal: Denies any acute onset joint swelling, redness, loss of ROM, or weakness Neurological: No reported episodes of acute onset apraxia, aphasia, dysarthria, agnosia, amnesia, paralysis, loss of coordination, or loss of consciousness  Medication Review  ALPRAZolam, Benefiber, EPINEPHrine, FreeStyle Libre 14 Day Reader, FreeStyle Libre 14 Day Sensor, Insulin Pen Needle, acetaminophen, aspirin EC, buPROPion, carvedilol, desoximetasone, empagliflozin, escitalopram, gabapentin, glucose blood, hydrochlorothiazide, insulin degludec, insulin lispro, loratadine, lubiprostone, metFORMIN, methocarbamol, mometasone, morphine, naloxone, olmesartan, pantoprazole, rosuvastatin, sennosides-docusate sodium, tamsulosin, and tirzepatide  History Review  Allergy: Alan Schmidt is allergic to contrast media [iodinated contrast media], iodine, and shellfish allergy. Drug: Alan Schmidt  reports no history of drug use. Alcohol:  reports no history of alcohol use. Tobacco:  reports that he has quit smoking. His smoking use included cigarettes. He has never used smokeless tobacco. Social: Alan Schmidt  reports that he has quit smoking. His smoking use included cigarettes. He has never used smokeless tobacco. He reports that he does not drink alcohol and does not use drugs. Medical:  has a past medical history of Acute postoperative pain (11/24/2016), Anxiety, Chronic hip pain (Right) (12/05/2014), Chronic lumbar pain, Depression, Diabetes mellitus without complication (HCC), Hyperlipidemia, Hypertension, Kidney stones, and Migraines. Surgical: Alan Schmidt  has a past surgical history that includes Total hip arthroplasty; Tonsillectomy; and right hip surgery. Family: family history includes Cancer in his mother; Diabetes in his father, sister, and sister; Heart disease in his father; Stroke in his  father.  Laboratory Chemistry Profile   Renal Lab Results  Component Value Date   BUN 8 06/08/2022   CREATININE 0.94 06/08/2022   BCR NOT APPLICABLE 04/25/2021   GFR 78.85 06/08/2022   GFRAA >60 03/26/2018   GFRNONAA >60 03/26/2018    Hepatic Lab Results  Component Value Date   AST 35 06/08/2022   ALT 35 06/08/2022   ALBUMIN 4.1 06/08/2022   ALKPHOS 73 06/08/2022    Electrolytes Lab Results  Component Value Date   NA 140 06/08/2022   K 3.6 06/08/2022   CL 100 06/08/2022   CALCIUM 9.7 06/08/2022   MG 1.8 03/06/2015    Bone No results found for: "VD25OH", "VD125OH2TOT", "ZH0865HQ4", "ON6295MW4", "25OHVITD1", "25OHVITD2", "25OHVITD3", "TESTOFREE", "TESTOSTERONE"  Inflammation (CRP: Acute Phase) (ESR: Chronic Phase) Lab Results  Component Value Date   CRP 0.5 03/06/2015   ESRSEDRATE 45 (H) 12/14/2016         Note: Above Lab results reviewed.  Recent Imaging Review  MR LUMBAR SPINE WO CONTRAST CLINICAL DATA:  Low back pain. Radiating into right leg for 6 weeks.  EXAM: MRI LUMBAR  SPINE WITHOUT CONTRAST  TECHNIQUE: Multiplanar, multisequence MR imaging of the lumbar spine was performed. No intravenous contrast was administered.  COMPARISON:  Lumbar spine radiographs 11/23/2019.  FINDINGS: Segmentation: Conventional numbering is assumed with 5 non-rib-bearing, lumbar type vertebral bodies.  Alignment:  Trace anterolisthesis of L4 on L5 and L5 on S1.  Vertebrae: Normal vertebral body heights. Modic type 1 degenerative endplate marrow signal changes at L2-3.  Conus medullaris and cauda equina: Conus extends to the L1 level. Conus and cauda equina appear normal.  Paraspinal and other soft tissues: Fatty atrophy of the paraspinal muscles with near-complete fatty replacement of the right psoas muscle.  Disc levels:  T12-L1: Small disc bulge and mild bilateral facet arthropathy. No spinal canal stenosis or neural foraminal narrowing.  L1-L2: Small disc  bulge and mild bilateral facet arthropathy. No spinal canal stenosis or neural foraminal narrowing.  L2-L3: Disc bulge and facet arthropathy results in moderate spinal canal stenosis. No significant neural foraminal narrowing.  L3-L4: Disc bulge and facet arthropathy results in mild spinal canal stenosis. No significant neural foraminal narrowing.  L4-L5: Disc bulge and severe bilateral facet arthropathy results in moderate-to-severe spinal canal stenosis and mild bilateral neural foraminal narrowing.  L5-S1: Disc bulge with superimposed right subarticular disc protrusion results in compression of the traversing right S1 nerve root in the subarticular zone. Facet arthropathy contributes to mild bilateral neural foraminal narrowing.  IMPRESSION: 1. Multilevel lumbar spondylosis, worst at L4-5, where there is moderate-to-severe spinal canal stenosis. 2. Moderate spinal canal stenosis at L2-3. 3. At L5-S1, a right subarticular disc protrusion compresses the traversing right S1 nerve root. 4. Mild spinal canal stenosis at L3-4.  Electronically Signed   By: Orvan Falconer M.D.   On: 06/02/2022 16:01 Note: Reviewed        Physical Exam  General appearance: Well nourished, well developed, and well hydrated. In no apparent acute distress Mental status: Alert, oriented x 3 (person, place, & time)       Respiratory: No evidence of acute respiratory distress Eyes: PERLA Vitals: BP 130/83   Pulse 81   Temp (!) 97.3 F (36.3 C)   Ht 5\' 6"  (1.676 m)   Wt 250 lb (113.4 kg)   SpO2 96%   BMI 40.35 kg/m  BMI: Estimated body mass index is 40.35 kg/m as calculated from the following:   Height as of this encounter: 5\' 6"  (1.676 m).   Weight as of this encounter: 250 lb (113.4 kg). Ideal: Ideal body weight: 63.8 kg (140 lb 10.5 oz) Adjusted ideal body weight: 83.6 kg (184 lb 6.3 oz)  Assessment   Diagnosis Status  1. Chronic pain syndrome   2. Pharmacologic therapy   3. DDD  (degenerative disc disease), cervical   4. DDD (degenerative disc disease), lumbar   5. Chronic neck pain (midline over the C7 spinous processes) (L>R)   6. Lumbar facet syndrome (Bilateral) (R>L)   7. Encounter for medication management   8. Encounter for chronic pain management   9. Chronic use of opiate for therapeutic purpose   10. Chronic knee pain (Bilateral)   11. Chronic lower extremity pain (2ry area of Pain) (Bilateral) (R>L)   12. Chronic low back pain (1ry area of Pain) (Bilateral) w/o sciatica   13. 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  Plan of Care  Problem-specific:  No problem-specific Assessment & Plan notes found for this encounter.  Alan Schmidt has a current medication list which includes the following long-term medication(s): bupropion, carvedilol, escitalopram, gabapentin, hydrochlorothiazide, insulin lispro, loratadine, lubiprostone, metformin, mometasone, olmesartan, pantoprazole, rosuvastatin, morphine, morphine, and benefiber.  Pharmacotherapy (Medications Ordered): Meds ordered this encounter  Medications   DISCONTD: morphine (MS CONTIN) 30 MG 12 hr tablet    Sig: Take 1 tablet (30 mg total) by mouth every 12 (twelve) hours. Must last 30 days. Do not break tablet    Dispense:  60 tablet    Refill:  0    DO NOT: delete (not duplicate); no partial-fill (will deny script to complete), no refill request (F/U required). DISPENSE: 1 day early if closed on fill date. WARN: No CNS-depressants within 8 hrs of med.   naloxone (NARCAN) nasal spray 4 mg/0.1 mL    Sig: Place 1 spray into the nose as needed for up to 365 doses (for opioid-induced respiratory depresssion). In case of emergency (overdose), spray once into each nostril. If no response within 3 minutes, repeat application and call 911.    Dispense:  1 each    Refill:  0    Instruct patient in  proper use of device.   morphine (MS CONTIN) 30 MG 12 hr tablet    Sig: Take 1 tablet (30 mg total) by mouth every 12 (twelve) hours. Must last 30 days. Do not break tablet    Dispense:  60 tablet    Refill:  0    DO NOT: delete (not duplicate); no partial-fill (will deny script to complete), no refill request (F/U required). DISPENSE: 1 day early if closed on fill date. WARN: No CNS-depressants within 8 hrs of med.   Orders:  Orders Placed This Encounter  Procedures   ToxASSURE Select 13 (MW), Urine    Volume: 30 ml(s). Minimum 3 ml of urine is needed. Document temperature of fresh sample. Indications: Long term (current) use of opiate analgesic (Z61.096)    Order Specific Question:   Release to patient    Answer:   Immediate   Nursing Instructions:    1). STAT: UDS required today. 2). Make sure to document all opioids and benzodiazepines taken, including time of last intake. 3). If order is entered on a procedure day, make sure sample is obtained before any medications are administered.   Follow-up plan:   Return in about 1 month (around 08/07/2022) for Eval-day (M,W), (F2F), (MM).      Interventional Therapies  Risk Factors  Considerations:   Allergy: CONTRAST, IODINE, & shellfish  VASOVAGAL RXN BNZ Tx   MO (L-FCT 5" needle to the hub)      NO MORE RFA - intraoperative noncompliance, unnecessarily increasing radiation exposure NOTE: NO MORE RFA procedures. Difficulty following intra-procedural instructions and commands, resulting in unnecessarily long exposure of staff to radiation.    Planned  Pending:   Therapeutic bilateral (L4-5, L5-S1) lumbar facet (L3-S1) MBB #4    Under consideration:   Diagnostic Left GONB   Completed:   Therapeutic bilateral lumbar facet (L2-S1) MBB x3 (12/16/21) (100/100/80/80)  Therapeutic left lumbar facet (L2-S1 MB) RFA x2 (05/09/2019) (90/90/77/>75)  Therapeutic right lumbar facet (L2-S1 MB) RFA x6 (12/14/2019) (100/100/70/70) (No  intra-op compliance)  Diagnostic/therapeutic right small joint inj. (thumb) x1 (03/15/2018) (N/A)  Diagnostic/therapeutic left cervical ESI x1 (12/18/2015) (90/70/50/50)    Completed by other providers:   None at this time   Therapeutic  Palliative (PRN) options:   Palliative lumbar facet MBB  Therapeutic cervical ESI    Pharmacotherapy  Nonopioids transferred 12/14/2019: Gabapentin, Amitiza, Benefiber, Mobic        Recent Visits Date Type Provider Dept  04/28/22 Office Visit Delano Metz, MD [facility]  04/14/22 Procedure visit Delano Metz, MD [facility]  Showing recent visits within past 90 days and meeting all other requirements Today's Visits Date Type Provider Dept  07/08/22 Office Visit Delano Metz, MD [facility]  Showing today's visits and meeting all other requirements Future Appointments No visits were found meeting these conditions. Showing future appointments within next 90 days and meeting all other requirements  I discussed the assessment and treatment plan with the patient. The patient was provided an opportunity to ask questions and all were answered. The patient agreed with the plan and demonstrated an understanding of the instructions.  Patient advised to call back or seek an in-person evaluation if the symptoms or condition worsens.  Duration of encounter: 30 minutes.  Total time on encounter, as per AMA guidelines included both the face-to-face and non-face-to-face time personally spent by the physician and/or other qualified health care professional(s) on the day of the encounter (includes time in activities that require the physician or other qualified health care professional and does not include time in activities normally performed by clinical staff). Physician's time may include the following activities when performed: Preparing to see the patient (e.g., pre-charting review of records, searching for  previously ordered imaging, lab work, and nerve conduction tests) Review of prior analgesic pharmacotherapies. Reviewing PMP Interpreting ordered tests (e.g., lab work, imaging, nerve conduction tests) Performing post-procedure evaluations, including interpretation of diagnostic procedures Obtaining and/or reviewing separately obtained history Performing a medically appropriate examination and/or evaluation Counseling and educating the patient/family/caregiver Ordering medications, tests, or procedures Referring and communicating with other health care professionals (when not separately reported) Documenting clinical information in the electronic or other health record Independently interpreting results (not separately reported) and communicating results to the patient/ family/caregiver Care coordination (not separately reported)  Note by: Oswaldo Done, MD Date: 07/08/2022; Time: 12:22 PM

## 2022-07-09 DIAGNOSIS — E118 Type 2 diabetes mellitus with unspecified complications: Secondary | ICD-10-CM | POA: Diagnosis not present

## 2022-07-09 DIAGNOSIS — Z794 Long term (current) use of insulin: Secondary | ICD-10-CM | POA: Diagnosis not present

## 2022-07-09 DIAGNOSIS — E1165 Type 2 diabetes mellitus with hyperglycemia: Secondary | ICD-10-CM | POA: Diagnosis not present

## 2022-07-10 LAB — TOXASSURE SELECT 13 (MW), URINE

## 2022-07-15 ENCOUNTER — Ambulatory Visit: Payer: 59

## 2022-07-22 ENCOUNTER — Ambulatory Visit: Payer: 59

## 2022-07-23 ENCOUNTER — Other Ambulatory Visit: Payer: Self-pay

## 2022-07-23 ENCOUNTER — Other Ambulatory Visit: Payer: Self-pay | Admitting: Family Medicine

## 2022-07-23 DIAGNOSIS — E1142 Type 2 diabetes mellitus with diabetic polyneuropathy: Secondary | ICD-10-CM

## 2022-07-23 MED ORDER — DESOXIMETASONE 0.25 % EX CREA: TOPICAL_CREAM | CUTANEOUS | 0 refills | Status: AC

## 2022-07-23 MED ORDER — TIRZEPATIDE 7.5 MG/0.5ML ~~LOC~~ SOAJ
7.5000 mg | SUBCUTANEOUS | 2 refills | Status: DC
Start: 2022-07-23 — End: 2023-02-10

## 2022-07-23 MED ORDER — DESOXIMETASONE 0.25 % EX CREA: TOPICAL_CREAM | CUTANEOUS | 0 refills | Status: DC

## 2022-07-23 NOTE — Telephone Encounter (Signed)
This patient should be on mounjaro 7.5 mg weekly. Please see if he needs a refill on the 7.5 mg dose. Thanks.

## 2022-07-25 ENCOUNTER — Other Ambulatory Visit: Payer: Self-pay | Admitting: Family Medicine

## 2022-07-27 NOTE — Progress Notes (Signed)
(  08/03/2022) NO-SHOW to medication management.  According to the PMP his last prescription was filled on 07/08/2022.  That last prescription should last until 08/07/2022.  However, on 07/08/2022 he had another prescription sent to the pharmacy.  If he has that prescription filled on 08/07/2022, then he should have enough medicine to last until 09/07/2022.

## 2022-07-29 ENCOUNTER — Ambulatory Visit: Payer: 59 | Admitting: Physical Therapy

## 2022-07-30 ENCOUNTER — Other Ambulatory Visit: Payer: Self-pay | Admitting: Pain Medicine

## 2022-07-30 ENCOUNTER — Other Ambulatory Visit: Payer: Self-pay | Admitting: Family Medicine

## 2022-07-30 DIAGNOSIS — G894 Chronic pain syndrome: Secondary | ICD-10-CM

## 2022-07-30 DIAGNOSIS — Z79899 Other long term (current) drug therapy: Secondary | ICD-10-CM

## 2022-07-30 DIAGNOSIS — Z79891 Long term (current) use of opiate analgesic: Secondary | ICD-10-CM

## 2022-07-30 NOTE — Patient Instructions (Signed)
____________________________________________________________________________________________  Opioid Pain Medication Update  To: All patients taking opioid pain medications. (I.e.: hydrocodone, hydromorphone, oxycodone, oxymorphone, morphine, codeine, methadone, tapentadol, tramadol, buprenorphine, fentanyl, etc.)  Re: Updated review of side effects and adverse reactions of opioid analgesics, as well as new information about long term effects of this class of medications.  Direct risks of long-term opioid therapy are not limited to opioid addiction and overdose. Potential medical risks include serious fractures, breathing problems during sleep, hyperalgesia, immunosuppression, chronic constipation, bowel obstruction, myocardial infarction, and tooth decay secondary to xerostomia.  Unpredictable adverse effects that can occur even if you take your medication correctly: Cognitive impairment, respiratory depression, and death. Most people think that if they take their medication "correctly", and "as instructed", that they will be safe. Nothing could be farther from the truth. In reality, a significant amount of recorded deaths associated with the use of opioids has occurred in individuals that had taken the medication for a long time, and were taking their medication correctly. The following are examples of how this can happen: Patient taking his/her medication for a long time, as instructed, without any side effects, is given a certain antibiotic or another unrelated medication, which in turn triggers a "Drug-to-drug interaction" leading to disorientation, cognitive impairment, impaired reflexes, respiratory depression or an untoward event leading to serious bodily harm or injury, including death.  Patient taking his/her medication for a long time, as instructed, without any side effects, develops an acute impairment of liver and/or kidney function. This will lead to a rapid inability of the body to  breakdown and eliminate their pain medication, which will result in effects similar to an "overdose", but with the same medicine and dose that they had always taken. This again may lead to disorientation, cognitive impairment, impaired reflexes, respiratory depression or an untoward event leading to serious bodily harm or injury, including death.  A similar problem will occur with patients as they grow older and their liver and kidney function begins to decrease as part of the aging process.  Background information: Historically, the original case for using long-term opioid therapy to treat chronic noncancer pain was based on safety assumptions that subsequent experience has called into question. In 1996, the American Pain Society and the American Academy of Pain Medicine issued a consensus statement supporting long-term opioid therapy. This statement acknowledged the dangers of opioid prescribing but concluded that the risk for addiction was low; respiratory depression induced by opioids was short-lived, occurred mainly in opioid-naive patients, and was antagonized by pain; tolerance was not a common problem; and efforts to control diversion should not constrain opioid prescribing. This has now proven to be wrong. Experience regarding the risks for opioid addiction, misuse, and overdose in community practice has failed to support these assumptions.  According to the Centers for Disease Control and Prevention, fatal overdoses involving opioid analgesics have increased sharply over the past decade. Currently, more than 96,700 people die from drug overdoses every year. Opioids are a factor in 7 out of every 10 overdose deaths. Deaths from drug overdose have surpassed motor vehicle accidents as the leading cause of death for individuals between the ages of 35 and 54.  Clinical data suggest that neuroendocrine dysfunction may be very common in both men and women, potentially causing hypogonadism, erectile  dysfunction, infertility, decreased libido, osteoporosis, and depression. Recent studies linked higher opioid dose to increased opioid-related mortality. Controlled observational studies reported that long-term opioid therapy may be associated with increased risk for cardiovascular events. Subsequent meta-analysis concluded   that the safety of long-term opioid therapy in elderly patients has not been proven.   Side Effects and adverse reactions: Common side effects: Drowsiness (sedation). Dizziness. Nausea and vomiting. Constipation. Physical dependence -- Dependence often manifests with withdrawal symptoms when opioids are discontinued or decreased. Tolerance -- As you take repeated doses of opioids, you require increased medication to experience the same effect of pain relief. Respiratory depression -- This can occur in healthy people, especially with higher doses. However, people with COPD, asthma or other lung conditions may be even more susceptible to fatal respiratory impairment.  Uncommon side effects: An increased sensitivity to feeling pain and extreme response to pain (hyperalgesia). Chronic use of opioids can lead to this. Delayed gastric emptying (the process by which the contents of your stomach are moved into your small intestine). Muscle rigidity. Immune system and hormonal dysfunction. Quick, involuntary muscle jerks (myoclonus). Arrhythmia. Itchy skin (pruritus). Dry mouth (xerostomia).  Long-term side effects: Chronic constipation. Sleep-disordered breathing (SDB). Increased risk of bone fractures. Hypothalamic-pituitary-adrenal dysregulation. Increased risk of overdose.  RISKS: Fractures and Falls:  Opioids increase the risk and incidence of falls. This is of particular importance in elderly patients.  Endocrine System:  Long-term administration is associated with endocrine abnormalities (endocrinopathies). (Also known as Opioid-induced Endocrinopathy) Influences  on both the hypothalamic-pituitary-adrenal axis?and the hypothalamic-pituitary-gonadal axis have been demonstrated with consequent hypogonadism and adrenal insufficiency in both sexes. Hypogonadism and decreased levels of dehydroepiandrosterone sulfate have been reported in men and women. Endocrine effects include: Amenorrhoea in women (abnormal absence of menstruation) Reduced libido in both sexes Decreased sexual function Erectile dysfunction in men Hypogonadisms (decreased testicular function with shrinkage of testicles) Infertility Depression and fatigue Loss of muscle mass Anxiety Depression Immune suppression Hyperalgesia Weight gain Anemia Osteoporosis Patients (particularly women of childbearing age) should avoid opioids. There is insufficient evidence to recommend routine monitoring of asymptomatic patients taking opioids in the long-term for hormonal deficiencies.  Immune System: Human studies have demonstrated that opioids have an immunomodulating effect. These effects are mediated via opioid receptors both on immune effector cells and in the central nervous system. Opioids have been demonstrated to have adverse effects on antimicrobial response and anti-tumour surveillance. Buprenorphine has been demonstrated to have no impact on immune function.  Opioid Induced Hyperalgesia: Human studies have demonstrated that prolonged use of opioids can lead to a state of abnormal pain sensitivity, sometimes called opioid induced hyperalgesia (OIH). Opioid induced hyperalgesia is not usually seen in the absence of tolerance to opioid analgesia. Clinically, hyperalgesia may be diagnosed if the patient on long-term opioid therapy presents with increased pain. This might be qualitatively and anatomically distinct from pain related to disease progression or to breakthrough pain resulting from development of opioid tolerance. Pain associated with hyperalgesia tends to be more diffuse than the  pre-existing pain and less defined in quality. Management of opioid induced hyperalgesia requires opioid dose reduction.  Cancer: Chronic opioid therapy has been associated with an increased risk of cancer among noncancer patients with chronic pain. This association was more evident in chronic strong opioid users. Chronic opioid consumption causes significant pathological changes in the small intestine and colon. Epidemiological studies have found that there is a link between opium dependence and initiation of gastrointestinal cancers. Cancer is the second leading cause of death after cardiovascular disease. Chronic use of opioids can cause multiple conditions such as GERD, immunosuppression and renal damage as well as carcinogenic effects, which are associated with the incidence of cancers.   Mortality: Long-term opioid use   has been associated with increased mortality among patients with chronic non-cancer pain (CNCP).  Prescription of long-acting opioids for chronic noncancer pain was associated with a significantly increased risk of all-cause mortality, including deaths from causes other than overdose.  Reference: Von Korff M, Kolodny A, Deyo RA, Chou R. Long-term opioid therapy reconsidered. Ann Intern Med. 2011 Sep 6;155(5):325-8. doi: 10.7326/[id number]. PMID: 21893626; PMCID: PMC3280085. Bedson J, Chen Y, Ashworth J, Hayward RA, Dunn KM, Jordan KP. Risk of adverse events in patients prescribed long-term opioids: A cohort study in the [place] Clinical Practice Research Datalink. Eur J Pain. 2019 May;23(5):908-922. doi: 10.1002/ejp.1357. Epub 2019 Jan 31. PMID: 30620116. Colameco S, Coren JS, Ciervo CA. Continuous opioid treatment for chronic noncancer pain: a time for moderation in prescribing. Postgrad Med. 2009 Jul;121(4):61-6. doi: 10.3810/pgm.2009.07.2032. PMID: 19641271. Chou R, Turner JA, Devine EB, Hansen RN, Sullivan SD, Blazina I, Dana T, Bougatsos C, Deyo RA. The  effectiveness and risks of long-term opioid therapy for chronic pain: a systematic review for a National Institutes of Health Pathways to Prevention Workshop. Ann Intern Med. 2015 Feb 17;162(4):276-86. doi: 10.7326/M14-2559. PMID: 25581257. Warner M, Chen LH, Makuc DM. NCHS Data Brief No. 22. [place]: Centers for Disease Control and Prevention; 2009. Sep, Increase in Fatal Poisonings Involving Opioid Analgesics in the [place], 1999-2006. Song IA, Choi HR, Oh TK. Long-term opioid use and mortality in patients with chronic non-cancer pain: Ten-year follow-up study in [place] from 2010 through 2019. EClinicalMedicine. 2022 Jul 18;51:101558. doi: 10.1016/j.eclinm.2022.101558. PMID: 35875817; PMCID: PMC9304910. Huser, W., Schubert, T., Vogelmann, T. et al. All-cause mortality in patients with long-term opioid therapy compared with non-opioid analgesics for chronic non-cancer pain: a database study. BMC Med 18, 162 (2020). https://doi.org/10.1186/[email or web address] Rashidian H, Zendehdel K, Kamangar F, Malekzadeh R, Haghdoost AA. An Ecological Study of the Association between Opiate Use and Incidence of Cancers. Addict Health. 2016 Fall;8(4):252-260. PMID: 28819556; PMCID: PMC5554805.  Our Goal: Our goal is to control your pain with means other than the use of opioid pain medications.  Our Recommendation: Talk to your physician about coming off of these medications. We can assist you with the tapering down and stopping these medicines. Based on the new information, even if you cannot completely stop the medication, a decrease in the dose may be associated with a lesser risk. Ask for other means of controlling the pain. Decrease or eliminate those factors that significantly contribute to your pain such as smoking, obesity, and a diet heavily tilted towards "inflammatory" nutrients.  Last Updated: 04/01/2022    ____________________________________________________________________________________________     ____________________________________________________________________________________________  Transfer of Pain Medication between Pharmacies  Re: 2023 DEA Clarification on existing regulation  Published on DEA Website: October 03, 2021  Title: Revised Regulation Allows DEA-Registered Pharmacies to Transfer Electronic Prescriptions at a Patient's Request DEA Headquarters Division - Public Information Office  "Patients now have the ability to request their electronic prescription be transferred to another pharmacy without having to go back to their practitioner to initiate the request. This revised regulation went into effect on Monday, September 29, 2021.     At a patient's request, a DEA-registered retail pharmacy can now transfer an electronic prescription for a controlled substance (schedules II-V) to another DEA-registered retail pharmacy. Prior to this change, patients would have to go through their practitioner to cancel their prescription and have it re-issued to a different pharmacy. The process was taxing and time consuming for both patients and practitioners.    The Drug Enforcement Administration (DEA) published its intent to revise the   process for transferring electronic prescriptions on December 22, 2019.  The final rule was published in the federal register on August 28, 2021 and went into effect 30 days later.  Under the final rule, a prescription can only be transferred once between pharmacies, and only if allowed under existing state or other applicable law. The prescription must remain in its electronic form; may not be altered in any way; and the transfer must be communicated directly between two licensed pharmacists. It's important to note, any authorized refills transfer with the original prescription, which means the entire prescription will be filled at the same pharmacy."     REFERENCES: 1. DEA website announcement https://www.dea.gov/stories/2023/2023-10/2021-09-01/revised-regulation-allows-dea-registered-pharmacies-transfer  2. Department of Justice website  https://www.govinfo.gov/content/pkg/FR-2021-08-28/pdf/2023-15847.pdf  3. DEPARTMENT OF JUSTICE Drug Enforcement Administration 21 CFR Part 1306 [Docket No. DEA-637] RIN [id number] "Transfer of Electronic Prescriptions for Schedules II-V Controlled Substances Between Pharmacies for Initial Filling"  ____________________________________________________________________________________________     _______________________________________________________________________  Medication Rules  Purpose: To inform patients, and their family members, of our medication rules and regulations.  Applies to: All patients receiving prescriptions from our practice (written or electronic).  Pharmacy of record: This is the pharmacy where your electronic prescriptions will be sent. Make sure we have the correct one.  Electronic prescriptions: In compliance with the [place] Strengthen Opioid Misuse Prevention (STOP) Act of 2017 (Session Law 2017-74/H243), effective February 02, 2018, all controlled substances must be electronically prescribed. Written prescriptions, faxing, or calling prescriptions to a pharmacy will no longer be done.  Prescription refills: These will be provided only during in-person appointments. No medications will be renewed without a "face-to-face" evaluation with your provider. Applies to all prescriptions.  NOTE: The following applies primarily to controlled substances (Opioid* Pain Medications).   Type of encounter (visit): For patients receiving controlled substances, face-to-face visits are required. (Not an option and not up to the patient.)  Patient's responsibilities: Pain Pills: Bring all pain pills to every appointment (except for procedure appointments). Pill Bottles: Bring  pills in original pharmacy bottle. Bring bottle, even if empty. Always bring the bottle of the most recent fill.  Medication refills: You are responsible for knowing and keeping track of what medications you are taking and when is it that you will need a refill. The day before your appointment: write a list of all prescriptions that need to be refilled. The day of the appointment: give the list to the admitting nurse. Prescriptions will be written only during appointments. No prescriptions will be written on procedure days. If you forget a medication: it will not be "Called in", "Faxed", or "electronically sent". You will need to get another appointment to get these prescribed. No early refills. Do not call asking to have your prescription filled early. Partial  or short prescriptions: Occasionally your pharmacy may not have enough pills to fill your prescription.  NEVER ACCEPT a partial fill or a prescription that is short of the total amount of pills that you were prescribed.  With controlled substances the law allows 72 hours for the pharmacy to complete the prescription.  If the prescription is not completed within 72 hours, the pharmacist will require a new prescription to be written. This means that you will be short on your medicine and we WILL NOT send another prescription to complete your original prescription.  Instead, request the pharmacy to send a carrier to a nearby branch to get enough medication to provide you with your full prescription. Prescription Accuracy: You are responsible for carefully inspecting your   prescriptions before leaving our office. Have the discharge nurse carefully go over each prescription with you, before taking them home. Make sure that your name is accurately spelled, that your address is correct. Check the name and dose of your medication to make sure it is accurate. Check the number of pills, and the written instructions to make sure they are clear and accurate. Make  sure that you are given enough medication to last until your next medication refill appointment. Taking Medication: Take medication as prescribed. When it comes to controlled substances, taking less pills or less frequently than prescribed is permitted and encouraged. Never take more pills than instructed. Never take the medication more frequently than prescribed.  Inform other Doctors: Always inform, all of your healthcare providers, of all the medications you take. Pain Medication from other Providers: You are not allowed to accept any additional pain medication from any other Doctor or Healthcare provider. There are two exceptions to this rule. (see below) In the event that you require additional pain medication, you are responsible for notifying us, as stated below. Cough Medicine: Often these contain an opioid, such as codeine or hydrocodone. Never accept or take cough medicine containing these opioids if you are already taking an opioid* medication. The combination may cause respiratory failure and death. Medication Agreement: You are responsible for carefully reading and following our Medication Agreement. This must be signed before receiving any prescriptions from our practice. Safely store a copy of your signed Agreement. Violations to the Agreement will result in no further prescriptions. (Additional copies of our Medication Agreement are available upon request.) Laws, Rules, & Regulations: All patients are expected to follow all Federal and State Laws, Statutes, Rules, & Regulations. Ignorance of the Laws does not constitute a valid excuse.  Illegal drugs and Controlled Substances: The use of illegal substances (including, but not limited to marijuana and its derivatives) and/or the illegal use of any controlled substances is strictly prohibited. Violation of this rule may result in the immediate and permanent discontinuation of any and all prescriptions being written by our practice. The use of  any illegal substances is prohibited. Adopted CDC guidelines & recommendations: Target dosing levels will be at or below 60 MME/day. Use of benzodiazepines** is not recommended.  Exceptions: There are only two exceptions to the rule of not receiving pain medications from other Healthcare Providers. Exception #1 (Emergencies): In the event of an emergency (i.e.: accident requiring emergency care), you are allowed to receive additional pain medication. However, you are responsible for: As soon as you are able, call our office [phone], at any time of the day or night, and leave a message stating your name, the date and nature of the emergency, and the name and dose of the medication prescribed. In the event that your call is answered by a member of our staff, make sure to document and save the date, time, and the name of the person that took your information.  Exception #2 (Planned Surgery): In the event that you are scheduled by another doctor or dentist to have any type of surgery or procedure, you are allowed (for a period no longer than 30 days), to receive additional pain medication, for the acute post-op pain. However, in this case, you are responsible for picking up a copy of our "Post-op Pain Management for Surgeons" handout, and giving it to your surgeon or dentist. This document is available at our office, and does not require an appointment to obtain it. Simply go to   our office during business hours (Monday-Thursday from 8:00 AM to 4:00 PM) (Friday 8:00 AM to 12:00 Noon) or if you have a scheduled appointment with us, prior to your surgery, and ask for it by name. In addition, you are responsible for: calling our office [phone], at any time of the day or night, and leaving a message stating your name, name of your surgeon, type of surgery, and date of procedure or surgery. Failure to comply with your responsibilities may result in termination of therapy involving the controlled  substances. Medication Agreement Violation. Following the above rules, including your responsibilities will help you in avoiding a Medication Agreement Violation ("Breaking your Pain Medication Contract").  Consequences:  Not following the above rules may result in permanent discontinuation of medication prescription therapy.  *Opioid medications include: morphine, codeine, oxycodone, oxymorphone, hydrocodone, hydromorphone, meperidine, tramadol, tapentadol, buprenorphine, fentanyl, methadone. **Benzodiazepine medications include: diazepam (Valium), alprazolam (Xanax), clonazepam (Klonopine), lorazepam (Ativan), clorazepate (Tranxene), chlordiazepoxide (Librium), estazolam (Prosom), oxazepam (Serax), temazepam (Restoril), triazolam (Halcion) (Last updated: 11/25/2021) ______________________________________________________________________    ______________________________________________________________________  Medication Recommendations and Reminders  Applies to: All patients receiving prescriptions (written and/or electronic).  Medication Rules & Regulations: You are responsible for reading, knowing, and following our "Medication Rules" document. These exist for your safety and that of others. They are not flexible and neither are we. Dismissing or ignoring them is an act of "non-compliance" that may result in complete and irreversible termination of such medication therapy. For safety reasons, "non-compliance" will not be tolerated. As with the [place] fundamental legal principle of "ignorance of the law is no defense", we will accept no excuses for not having read and knowing the content of documents provided to you by our practice.  Pharmacy of record:  Definition: This is the pharmacy where your electronic prescriptions will be sent.  We do not endorse any particular pharmacy. It is up to you and your insurance to decide what pharmacy to use.  We do not restrict you in your choice of  pharmacy. However, once we write for your prescriptions, we will NOT be re-sending more prescriptions to fix restricted supply problems created by your pharmacy, or your insurance.  The pharmacy listed in the electronic medical record should be the one where you want electronic prescriptions to be sent. If you choose to change pharmacy, simply notify our nursing staff. Changes will be made only during your regular appointments and not over the phone.  Recommendations: Keep all of your pain medications in a safe place, under lock and key, even if you live alone. We will NOT replace lost, stolen, or damaged medication. We do not accept "Police Reports" as proof of medications having been stolen. After you fill your prescription, take 1 week's worth of pills and put them away in a safe place. You should keep a separate, properly labeled bottle for this purpose. The remainder should be kept in the original bottle. Use this as your primary supply, until it runs out. Once it's gone, then you know that you have 1 week's worth of medicine, and it is time to come in for a prescription refill. If you do this correctly, it is unlikely that you will ever run out of medicine. To make sure that the above recommendation works, it is very important that you make sure your medication refill appointments are scheduled at least 1 week before you run out of medicine. To do this in an effective manner, make sure that you do not leave the office without   scheduling your next medication management appointment. Always ask the nursing staff to show you in your prescription , when your medication will be running out. Then arrange for the receptionist to get you a return appointment, at least 7 days before you run out of medicine. Do not wait until you have 1 or 2 pills left, to come in. This is very poor planning and does not take into consideration that we may need to cancel appointments due to bad weather, sickness, or emergencies  affecting our staff. DO NOT ACCEPT A "Partial Fill": If for any reason your pharmacy does not have enough pills/tablets to completely fill or refill your prescription, do not allow for a "partial fill". The law allows the pharmacy to complete that prescription within 72 hours, without requiring a new prescription. If they do not fill the rest of your prescription within those 72 hours, you will need a separate prescription to fill the remaining amount, which we will NOT provide. If the reason for the partial fill is your insurance, you will need to talk to the pharmacist about payment alternatives for the remaining tablets, but again, DO NOT ACCEPT A PARTIAL FILL, unless you can trust your pharmacist to obtain the remainder of the pills within 72 hours.  Prescription refills and/or changes in medication(s):  Prescription refills, and/or changes in dose or medication, will be conducted only during scheduled medication management appointments. (Applies to both, written and electronic prescriptions.) No refills on procedure days. No medication will be changed or started on procedure days. No changes, adjustments, and/or refills will be conducted on a procedure day. Doing so will interfere with the diagnostic portion of the procedure. No phone refills. No medications will be "called into the pharmacy". No Fax refills. No weekend refills. No Holliday refills. No after hours refills.  Remember:  Business hours are:  Monday to Thursday 8:00 AM to 4:00 PM Provider's Schedule: Breon Diss, MD - Appointments are:  Medication management: Monday and Wednesday 8:00 AM to 4:00 PM Procedure day: Tuesday and Thursday 7:30 AM to 4:00 PM Bilal Lateef, MD - Appointments are:  Medication management: Tuesday and Thursday 8:00 AM to 4:00 PM Procedure day: Monday and Wednesday 7:30 AM to 4:00 PM (Last update: 11/25/2021) ______________________________________________________________________    ____________________________________________________________________________________________  Naloxone Nasal Spray  Why am I receiving this medication? [place] STOP ACT requires that all patients taking high dose opioids or at risk of opioids respiratory depression, be prescribed an opioid reversal agent, such as Naloxone (AKA: Narcan).  What is this medication? NALOXONE (nal OX one) treats opioid overdose, which causes slow or shallow breathing, severe drowsiness, or trouble staying awake. Call emergency services after using this medication. You may need additional treatment. Naloxone works by reversing the effects of opioids. It belongs to a group of medications called opioid blockers.  COMMON BRAND NAME(S): Kloxxado, Narcan  What should I tell my care team before I take this medication? They need to know if you have any of these conditions: Heart disease Substance use disorder An unusual or allergic reaction to naloxone, other medications, foods, dyes, or preservatives Pregnant or trying to get pregnant Breast-feeding  When to use this medication? This medication is to be used for the treatment of respiratory depression (less than 8 breaths per minute) secondary to opioid overdose.   How to use this medication? This medication is for use in the nose. Lay the person on their back. Support their neck with your hand and allow the head to tilt   back before giving the medication. The nasal spray should be given into 1 nostril. After giving the medication, move the person onto their side. Do not remove or test the nasal spray until ready to use. Get emergency medical help right away after giving the first dose of this medication, even if the person wakes up. You should be familiar with how to recognize the signs and symptoms of a narcotic overdose. If more doses are needed, give the additional dose in the other nostril. Talk to your care team about the use of this medication in children.  While this medication may be prescribed for children as young as newborns for selected conditions, precautions do apply.  Naloxone Overdosage: If you think you have taken too much of this medicine contact a poison control center or emergency room at once.  NOTE: This medicine is only for you. Do not share this medicine with others.  What if I miss a dose? This does not apply.  What may interact with this medication? This is only used during an emergency. No interactions are expected during emergency use. This list may not describe all possible interactions. Give your health care provider a list of all the medicines, herbs, non-prescription drugs, or dietary supplements you use. Also tell them if you smoke, drink alcohol, or use illegal drugs. Some items may interact with your medicine.  What should I watch for while using this medication? Keep this medication ready for use in the case of an opioid overdose. Make sure that you have the phone number of your care team and local hospital ready. You may need to have additional doses of this medication. Each nasal spray contains a single dose. Some emergencies may require additional doses. After use, bring the treated person to the nearest hospital or call 911. Make sure the treating care team knows that the person has received a dose of this medication. You will receive additional instructions on what to do during and after use of this medication before an emergency occurs.  What side effects may I notice from receiving this medication? Side effects that you should report to your care team as soon as possible: Allergic reactions--skin rash, itching, hives, swelling of the face, lips, tongue, or throat Side effects that usually do not require medical attention (report these to your care team if they continue or are bothersome): Constipation Dryness or irritation inside the nose Headache Increase in blood pressure Muscle spasms Stuffy  nose Toothache This list may not describe all possible side effects. Call your doctor for medical advice about side effects. You may report side effects to FDA at 1-800-FDA-1088.  Where should I keep my medication? Because this is an emergency medication, you should keep it with you at all times.  Keep out of the reach of children and pets. Store between 20 and 25 degrees C (68 and 77 degrees F). Do not freeze. Throw away any unused medication after the expiration date. Keep in original box until ready to use.  NOTE: This sheet is a summary. It may not cover all possible information. If you have questions about this medicine, talk to your doctor, pharmacist, or health care provider.   2023 Elsevier/Gold Standard (2020-09-27 00:00:00)  ____________________________________________________________________________________________   

## 2022-08-03 ENCOUNTER — Ambulatory Visit (HOSPITAL_BASED_OUTPATIENT_CLINIC_OR_DEPARTMENT_OTHER): Payer: 59 | Admitting: Pain Medicine

## 2022-08-03 DIAGNOSIS — M5030 Other cervical disc degeneration, unspecified cervical region: Secondary | ICD-10-CM

## 2022-08-03 DIAGNOSIS — Z79891 Long term (current) use of opiate analgesic: Secondary | ICD-10-CM

## 2022-08-03 DIAGNOSIS — Z79899 Other long term (current) drug therapy: Secondary | ICD-10-CM

## 2022-08-03 DIAGNOSIS — Z91199 Patient's noncompliance with other medical treatment and regimen due to unspecified reason: Secondary | ICD-10-CM

## 2022-08-03 DIAGNOSIS — G8929 Other chronic pain: Secondary | ICD-10-CM

## 2022-08-03 DIAGNOSIS — M47816 Spondylosis without myelopathy or radiculopathy, lumbar region: Secondary | ICD-10-CM

## 2022-08-03 DIAGNOSIS — G894 Chronic pain syndrome: Secondary | ICD-10-CM

## 2022-08-03 DIAGNOSIS — M5136 Other intervertebral disc degeneration, lumbar region: Secondary | ICD-10-CM

## 2022-08-04 ENCOUNTER — Ambulatory Visit (INDEPENDENT_AMBULATORY_CARE_PROVIDER_SITE_OTHER): Payer: 59 | Admitting: Podiatry

## 2022-08-04 DIAGNOSIS — M79674 Pain in right toe(s): Secondary | ICD-10-CM | POA: Diagnosis not present

## 2022-08-04 DIAGNOSIS — R600 Localized edema: Secondary | ICD-10-CM

## 2022-08-04 DIAGNOSIS — M19072 Primary osteoarthritis, left ankle and foot: Secondary | ICD-10-CM

## 2022-08-04 DIAGNOSIS — E0843 Diabetes mellitus due to underlying condition with diabetic autonomic (poly)neuropathy: Secondary | ICD-10-CM | POA: Diagnosis not present

## 2022-08-04 DIAGNOSIS — B351 Tinea unguium: Secondary | ICD-10-CM | POA: Diagnosis not present

## 2022-08-04 DIAGNOSIS — M79675 Pain in left toe(s): Secondary | ICD-10-CM | POA: Diagnosis not present

## 2022-08-04 NOTE — Progress Notes (Signed)
   Chief Complaint  Patient presents with   Nail Problem    Left foot hallux nail is bleeding and broken, 3rd toe nail is broken, Diabetic A1c-10.0 BG -197    SUBJECTIVE Patient with a history of diabetes mellitus presents to office today complaining of elongated, thickened nails that cause pain while ambulating in shoes.  Patient is unable to trim their own nails. Patient is here for further evaluation and treatment.  Past Medical History:  Diagnosis Date   Acute postoperative pain 11/24/2016   Anxiety    Chronic hip pain (Right) 12/05/2014   Chronic lumbar pain    Depression    Diabetes mellitus without complication (HCC)    Hyperlipidemia    Hypertension    Kidney stones    Migraines     Allergies  Allergen Reactions   Contrast Media [Iodinated Contrast Media] Swelling   Iodine Swelling   Shellfish Allergy Nausea And Vomiting and Swelling     OBJECTIVE General Patient is awake, alert, and oriented x 3 and in no acute distress. Derm Skin is dry and supple bilateral. Negative open lesions or macerations. Remaining integument unremarkable. Nails are tender, long, thickened and dystrophic with subungual debris, consistent with onychomycosis, 1-5 bilateral. No signs of infection noted. Vasc  DP and PT pedal pulses palpable bilaterally. Temperature gradient within normal limits.  Neuro Epicritic and protective threshold sensation diminished bilaterally.  Musculoskeletal Exam No symptomatic pedal deformities noted bilateral. Muscular strength within normal limits.  ASSESSMENT 1. Diabetes Mellitus w/ peripheral neuropathy 2.  Pain due to onychomycosis of toenails bilateral 3.  Chronic lower extremity edema 4.  DJD left ankle  PLAN OF CARE 1. Patient evaluated today. 2. Instructed to maintain good pedal hygiene and foot care. Stressed importance of controlling blood sugar.  3. Mechanical debridement of nails 1-5 bilaterally performed using a nail nipper. Filed with dremel  without incident.  There was some dried blood to the left hallux nail plate.  After debriding the nail plate and cleaning it up it should heal uneventfully.  There is only a superficial disruption of the skin to the lateral border of the left hallux nail plate 4.  Prescription for Silvadene cream apply with a Band-Aid daily x 1-2 weeks  5.  Appointment with diabetic shoe department for custom molded insoles and diabetic shoes  6.  Return to clinic annually    Felecia Shelling, DPM [facility]  Dr. Felecia Shelling, DPM    [address] [place], [place] [address]                Office [phone]  Fax [phone]

## 2022-08-05 ENCOUNTER — Ambulatory Visit: Payer: 59

## 2022-08-05 ENCOUNTER — Other Ambulatory Visit: Payer: Self-pay | Admitting: Family Medicine

## 2022-08-05 DIAGNOSIS — F419 Anxiety disorder, unspecified: Secondary | ICD-10-CM

## 2022-08-07 DIAGNOSIS — E119 Type 2 diabetes mellitus without complications: Secondary | ICD-10-CM | POA: Insufficient documentation

## 2022-08-08 DIAGNOSIS — Z794 Long term (current) use of insulin: Secondary | ICD-10-CM | POA: Diagnosis not present

## 2022-08-08 DIAGNOSIS — E1165 Type 2 diabetes mellitus with hyperglycemia: Secondary | ICD-10-CM | POA: Diagnosis not present

## 2022-08-08 DIAGNOSIS — E118 Type 2 diabetes mellitus with unspecified complications: Secondary | ICD-10-CM | POA: Diagnosis not present

## 2022-08-09 NOTE — Progress Notes (Unsigned)
PROVIDER NOTE: Information contained herein reflects review and annotations entered in association with encounter. Interpretation of such information and data should be left to medically-trained personnel. Information provided to patient can be located elsewhere in the medical record under "Patient Instructions". Document created using STT-dictation technology, any transcriptional errors that may result from process are unintentional.    Patient: Alan Schmidt  Service Category: E/M  Provider: Oswaldo Done, MD  DOB: [birth year]  DOS: 08/10/2022  Referring Provider: Glori Luis, MD  MRN: [id number]  Specialty: Interventional Pain Management  PCP: Glori Luis, MD  Type: Established Patient  Setting: Ambulatory outpatient    Location: Office  Delivery: Face-to-face     HPI  [name], a 77 y.o. year old male, is here today because of his No primary diagnosis found.. Alan Schmidt primary complain today is No chief complaint on file.  Pertinent problems: Alan Schmidt has Chronic low back pain (Bilateral) (R>L) w/ sciatica (Bilateral); Lumbar facet syndrome (Bilateral) (R>L); Lumbar spondylosis; Diabetic peripheral neuropathy (HCC); Chronic neck pain (midline over the C7 spinous processes) (L>R); Neurogenic pain; Neuropathic pain; Chronic lower extremity pain (2ry area of Pain) (Bilateral) (R>L); 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) (Right L5 dermatome); History of total hip replacement (Right); Chronic hip pain (Right); Abnormal nerve conduction studies (severe bilateral lower extremity polyneuropathy); Chronic shoulder pain (Bilateral) (R>L); Occipital neuralgia (Left); Cervicogenic headache (Left); Chronic shoulder radicular pain (Left); Chronic cervical radicular pain (Left); Chronic pain syndrome; Lumbar facet joint osteoarthritis (Bilateral); Chronic headache; Chronic tension-type headache, intractable; Coccygodynia; DDD (degenerative disc disease), lumbar;  Chronic musculoskeletal pain; Chronic knee pain (Bilateral); Thumb pain (Right); 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 Pain Assessment: Severity of   is reported as a  /10. Location:    / . Onset:  . Quality:  . Timing:  . Modifying factor(s):  Marland Kitchen Vitals:  vitals were not taken for this visit.  BMI: Estimated body mass index is 40.35 kg/m as calculated from the following:   Height as of 07/08/22: 5\' 6"  (1.676 m).   Weight as of 07/08/22: 250 lb (113.4 kg). Last encounter: 08/03/2022. Last procedure: 04/14/2022.  Reason for encounter: medication management. ***  RTCB:   Pharmacotherapy Assessment  Analgesic: Morphine ER (MS Contin) 30 mg, 1 tab p.o. twice daily (60 mg/day).  (Previously on oxycodone IR 10 mg every 6 hours (40 mg/day)) MME/day: 60 mg/day.   Monitoring: [place] PMP: PDMP reviewed during this encounter.       Pharmacotherapy: No side-effects or adverse reactions reported. Compliance: No problems identified. Effectiveness: Clinically acceptable.  No notes on file  No results found for: "CBDTHCR" No results found for: "D8THCCBX" No results found for: "D9THCCBX"  UDS:  Summary  Date Value Ref Range Status  07/08/2022 Note  Final    Comment:    ==================================================================== [employer] [facility] 13 (MW) ==================================================================== Test                             Result       Flag       Units  Drug Present and Declared for Prescription Verification  Alprazolam                      58           EXPECTED   ng/mg creat   Alpha-hydroxyalprazolam        142          EXPECTED   ng/mg creat    Source of alprazolam is a scheduled prescription medication. Alpha-    hydroxyalprazolam is an expected metabolite of alprazolam.    Morphine                       15430        EXPECTED   ng/mg creat   Normorphine                    570          EXPECTED   ng/mg creat    Potential sources of large amounts of morphine in the absence of    codeine include administration of morphine or use of heroin.     Normorphine is an expected metabolite of morphine.    Hydromorphone                  362          EXPECTED   ng/mg creat    Hydromorphone may be present as a metabolite of morphine;    concentrations of hydromorphone rarely exceed 5% of the morphine    concentration when this is the source of hydromorphone.  ==================================================================== Test                      Result    Flag   Units      Ref Range   Creatinine              50               mg/dL      >=95 ==================================================================== Declared Medications:  The flagging and interpretation on this report are based on the  following declared medications.  Unexpected results may arise from  inaccuracies in the declared medications.   **Note: The testing scope of this panel includes these medications:   Alprazolam  Morphine (MS Contin)   **Note: The testing scope of this panel does not include the  following reported medications:   Acetaminophen (Tylenol)  Aspirin  Bupropion (Wellbutrin XL)  Carvedilol (Coreg)  Desoximetasone (Topicort)  Empagliflozin (Jardiance)  Epinephrine (EpiPen)  Escitalopram (Lexapro)  Gabapentin (Neurontin)  Hydrochlorothiazide  Insulin Evaristo Bury)  Loratadine (Claritin)  Lubiprostone (Amitiza)  Metformin  Methocarbamol  Mometasone (Nasonex)  Naloxone (Narcan)  Olmesartan (Benicar)  Pantoprazole (Protonix)   Rosuvastatin (Crestor)  Supplement (Fiber)  Tamsulosin (Flomax)  Tirzepatide (Mounjaro) ==================================================================== For clinical consultation, please call [phone]. ====================================================================       ROS  Constitutional: Denies any fever or chills Gastrointestinal: No reported hemesis, hematochezia, vomiting, or acute GI distress Musculoskeletal: Denies any acute onset joint swelling, redness, loss of ROM, or weakness Neurological: No reported episodes of acute onset apraxia, aphasia, dysarthria, agnosia, amnesia, paralysis, loss of coordination, or loss of consciousness  Medication Review  ALPRAZolam, Benefiber, EPINEPHrine, FreeStyle Libre 14 Day Reader, FreeStyle Libre 14 Day Sensor, Insulin Pen Needle, acetaminophen, aspirin EC, buPROPion, carvedilol, desoximetasone, empagliflozin, escitalopram, gabapentin, glucose blood, hydrochlorothiazide, insulin degludec, insulin lispro, loratadine, lubiprostone, metFORMIN, methocarbamol, mometasone, morphine, naloxone, olmesartan, pantoprazole, rosuvastatin, sennosides-docusate sodium, tamsulosin, and tirzepatide  History  Review  Allergy: Alan Schmidt is allergic to contrast media [iodinated contrast media], iodine, and shellfish allergy. Drug: Alan Schmidt  reports no history of drug use. Alcohol:  reports no history of alcohol use. Tobacco:  reports that he has quit smoking. His smoking use included cigarettes. He has never used smokeless tobacco. Social: Alan Schmidt  reports that he has quit smoking. His smoking use included cigarettes. He has never used smokeless tobacco. He reports that he does not drink alcohol and does not use drugs. Medical:  has a past medical history of Acute postoperative pain (11/24/2016), Anxiety, Chronic hip pain (Right) (12/05/2014), Chronic lumbar pain, Depression, Diabetes mellitus without complication (HCC), Hyperlipidemia,  Hypertension, Kidney stones, and Migraines. Surgical: Alan Schmidt  has a past surgical history that includes Total hip arthroplasty; Tonsillectomy; and right hip surgery. Family: family history includes Cancer in his mother; Diabetes in his father, sister, and sister; Heart disease in his father; Stroke in his father.  Laboratory Chemistry Profile   Renal Lab Results  Component Value Date   BUN 8 06/08/2022   CREATININE 0.94 06/08/2022   BCR NOT APPLICABLE 04/25/2021   GFR 78.85 06/08/2022   GFRAA >60 03/26/2018   GFRNONAA >60 03/26/2018    Hepatic Lab Results  Component Value Date   AST 35 06/08/2022   ALT 35 06/08/2022   ALBUMIN 4.1 06/08/2022   ALKPHOS 73 06/08/2022    Electrolytes Lab Results  Component Value Date   NA 140 06/08/2022   K 3.6 06/08/2022   CL 100 06/08/2022   CALCIUM 9.7 06/08/2022   MG 1.8 03/06/2015    Bone No results found for: "VD25OH", "VD125OH2TOT", "ZO1096EA5", "WU9811BJ4", "25OHVITD1", "25OHVITD2", "25OHVITD3", "TESTOFREE", "TESTOSTERONE"  Inflammation (CRP: Acute Phase) (ESR: Chronic Phase) Lab Results  Component Value Date   CRP 0.5 03/06/2015   ESRSEDRATE 45 (H) 12/14/2016         Note: Above Lab results reviewed.  Recent Imaging Review  MR LUMBAR SPINE WO CONTRAST CLINICAL DATA:  Low back pain. Radiating into right leg for 6 weeks.  EXAM: MRI LUMBAR SPINE WITHOUT CONTRAST  TECHNIQUE: Multiplanar, multisequence MR imaging of the lumbar spine was performed. No intravenous contrast was administered.  COMPARISON:  Lumbar spine radiographs 11/23/2019.  FINDINGS: Segmentation: Conventional numbering is assumed with 5 non-rib-bearing, lumbar type vertebral bodies.  Alignment:  Trace anterolisthesis of L4 on L5 and L5 on S1.  Vertebrae: Normal vertebral body heights. Modic type 1 degenerative endplate marrow signal changes at L2-3.  Conus medullaris and cauda equina: Conus extends to the L1 level. Conus and cauda equina appear  normal.  Paraspinal and other soft tissues: Fatty atrophy of the paraspinal muscles with near-complete fatty replacement of the right psoas muscle.  Disc levels:  T12-L1: Small disc bulge and mild bilateral facet arthropathy. No spinal canal stenosis or neural foraminal narrowing.  L1-L2: Small disc bulge and mild bilateral facet arthropathy. No spinal canal stenosis or neural foraminal narrowing.  L2-L3: Disc bulge and facet arthropathy results in moderate spinal canal stenosis. No significant neural foraminal narrowing.  L3-L4: Disc bulge and facet arthropathy results in mild spinal canal stenosis. No significant neural foraminal narrowing.  L4-L5: Disc bulge and severe bilateral facet arthropathy results in moderate-to-severe spinal canal stenosis and mild bilateral neural foraminal narrowing.  L5-S1: Disc bulge with superimposed right subarticular disc protrusion results in compression of the traversing right S1 nerve root in the subarticular zone. Facet arthropathy contributes to mild bilateral neural foraminal narrowing.  IMPRESSION: 1. Multilevel lumbar  spondylosis, worst at L4-5, where there is moderate-to-severe spinal canal stenosis. 2. Moderate spinal canal stenosis at L2-3. 3. At L5-S1, a right subarticular disc protrusion compresses the traversing right S1 nerve root. 4. Mild spinal canal stenosis at L3-4.  Electronically Signed   By: Orvan Falconer M.D.   On: 06/02/2022 16:01 Note: Reviewed        Physical Exam  General appearance: Well nourished, well developed, and well hydrated. In no apparent acute distress Mental status: Alert, oriented x 3 (person, place, & time)       Respiratory: No evidence of acute respiratory distress Eyes: PERLA Vitals: There were no vitals taken for this visit. BMI: Estimated body mass index is 40.35 kg/m as calculated from the following:   Height as of 07/08/22: 5\' 6"  (1.676 m).   Weight as of 07/08/22: 250 lb (113.4  kg). Ideal: Patient weight not recorded  Assessment   Diagnosis Status  No diagnosis found. Controlled Controlled Controlled   Updated Problems: No problems updated.  Plan of Care  Problem-specific:  No problem-specific Assessment & Plan notes found for this encounter.  Alan Schmidt has a current medication list which includes the following long-term medication(s): bupropion, carvedilol, escitalopram, gabapentin, hydrochlorothiazide, insulin lispro, loratadine, lubiprostone, metformin, mometasone, morphine, olmesartan, pantoprazole, rosuvastatin, and benefiber.  Pharmacotherapy (Medications Ordered): No orders of the defined types were placed in this encounter.  Orders:  No orders of the defined types were placed in this encounter.  Follow-up plan:   No follow-ups on file.      Interventional Therapies  Risk Factors  Considerations:   Allergy: CONTRAST, IODINE, & shellfish  VASOVAGAL RXN BNZ Tx   MO (L-FCT 5" needle to the hub)      NO MORE RFA - intraoperative noncompliance, unnecessarily increasing radiation exposure NOTE: NO MORE RFA procedures. Difficulty following intra-procedural instructions and commands, resulting in unnecessarily long exposure of staff to radiation.    Planned  Pending:   Therapeutic bilateral (L4-5, L5-S1) lumbar facet (L3-S1) MBB #4    Under consideration:   Diagnostic Left GONB   Completed:   Therapeutic bilateral lumbar facet (L2-S1) MBB x3 (12/16/21) (100/100/80/80)  Therapeutic left lumbar facet (L2-S1 MB) RFA x2 (05/09/2019) (90/90/77/>75)  Therapeutic right lumbar facet (L2-S1 MB) RFA x6 (12/14/2019) (100/100/70/70) (No intra-op compliance)  Diagnostic/therapeutic right small joint inj. (thumb) x1 (03/15/2018) (N/A)  Diagnostic/therapeutic left cervical ESI x1 (12/18/2015) (90/70/50/50)    Completed by other providers:   None at this time   Therapeutic  Palliative (PRN) options:   Palliative lumbar facet MBB   Therapeutic cervical ESI    Pharmacotherapy  Nonopioids transferred 12/14/2019: Gabapentin, Amitiza, Benefiber, Mobic         Recent Visits Date Type Provider Dept  07/08/22 Office Visit Delano Metz, MD [facility]  Showing recent visits within past 90 days and meeting all other requirements Future Appointments Date Type Provider Dept  08/10/22 Appointment Delano Metz, MD [facility]  Showing future appointments within next 90 days and meeting all other requirements  I discussed the assessment and treatment plan with the patient. The patient was provided an opportunity to ask questions and all were answered. The patient agreed with the plan and demonstrated an understanding of the instructions.  Patient advised to call back or seek an in-person evaluation if the symptoms or condition worsens.  Duration of encounter: *** minutes.  Total time on encounter, as per AMA guidelines included both the face-to-face and non-face-to-face time personally spent  by the physician and/or other qualified health care professional(s) on the day of the encounter (includes time in activities that require the physician or other qualified health care professional and does not include time in activities normally performed by clinical staff). Physician's time may include the following activities when performed: Preparing to see the patient (e.g., pre-charting review of records, searching for previously ordered imaging, lab work, and nerve conduction tests) Review of prior analgesic pharmacotherapies. Reviewing PMP Interpreting ordered tests (e.g., lab work, imaging, nerve conduction tests) Performing post-procedure evaluations, including interpretation of diagnostic procedures Obtaining and/or reviewing separately obtained history Performing a medically appropriate examination and/or evaluation Counseling and educating the patient/family/caregiver Ordering medications,  tests, or procedures Referring and communicating with other health care professionals (when not separately reported) Documenting clinical information in the electronic or other health record Independently interpreting results (not separately reported) and communicating results to the patient/ family/caregiver Care coordination (not separately reported)  Note by: Oswaldo Done, MD Date: 08/10/2022; Time: 9:23 AM

## 2022-08-10 ENCOUNTER — Ambulatory Visit: Payer: 59 | Attending: Pain Medicine | Admitting: Pain Medicine

## 2022-08-10 ENCOUNTER — Encounter: Payer: Self-pay | Admitting: Pain Medicine

## 2022-08-10 VITALS — BP 134/76 | HR 79 | Temp 97.2°F | Resp 20 | Ht 72.0 in | Wt 255.0 lb

## 2022-08-10 DIAGNOSIS — M79604 Pain in right leg: Secondary | ICD-10-CM | POA: Diagnosis not present

## 2022-08-10 DIAGNOSIS — M47816 Spondylosis without myelopathy or radiculopathy, lumbar region: Secondary | ICD-10-CM

## 2022-08-10 DIAGNOSIS — G8929 Other chronic pain: Secondary | ICD-10-CM

## 2022-08-10 DIAGNOSIS — M5136 Other intervertebral disc degeneration, lumbar region: Secondary | ICD-10-CM | POA: Insufficient documentation

## 2022-08-10 DIAGNOSIS — M542 Cervicalgia: Secondary | ICD-10-CM | POA: Diagnosis not present

## 2022-08-10 DIAGNOSIS — M25561 Pain in right knee: Secondary | ICD-10-CM | POA: Insufficient documentation

## 2022-08-10 DIAGNOSIS — G894 Chronic pain syndrome: Secondary | ICD-10-CM | POA: Diagnosis not present

## 2022-08-10 DIAGNOSIS — Z79899 Other long term (current) drug therapy: Secondary | ICD-10-CM

## 2022-08-10 DIAGNOSIS — Z79891 Long term (current) use of opiate analgesic: Secondary | ICD-10-CM

## 2022-08-10 DIAGNOSIS — M25551 Pain in right hip: Secondary | ICD-10-CM | POA: Insufficient documentation

## 2022-08-10 DIAGNOSIS — M79605 Pain in left leg: Secondary | ICD-10-CM | POA: Diagnosis not present

## 2022-08-10 DIAGNOSIS — M545 Low back pain, unspecified: Secondary | ICD-10-CM | POA: Insufficient documentation

## 2022-08-10 DIAGNOSIS — Z96641 Presence of right artificial hip joint: Secondary | ICD-10-CM | POA: Diagnosis not present

## 2022-08-10 DIAGNOSIS — Z794 Long term (current) use of insulin: Secondary | ICD-10-CM | POA: Diagnosis not present

## 2022-08-10 DIAGNOSIS — M51369 Other intervertebral disc degeneration, lumbar region without mention of lumbar back pain or lower extremity pain: Secondary | ICD-10-CM

## 2022-08-10 DIAGNOSIS — M25562 Pain in left knee: Secondary | ICD-10-CM | POA: Diagnosis not present

## 2022-08-10 DIAGNOSIS — M5030 Other cervical disc degeneration, unspecified cervical region: Secondary | ICD-10-CM

## 2022-08-10 DIAGNOSIS — E1165 Type 2 diabetes mellitus with hyperglycemia: Secondary | ICD-10-CM | POA: Diagnosis not present

## 2022-08-10 DIAGNOSIS — E1140 Type 2 diabetes mellitus with diabetic neuropathy, unspecified: Secondary | ICD-10-CM | POA: Diagnosis not present

## 2022-08-10 NOTE — Progress Notes (Signed)
Nursing Pain Medication Assessment:  Safety precautions to be maintained throughout the outpatient stay will include: orient to surroundings, keep bed in low position, maintain call bell within reach at all times, provide assistance with transfer out of bed and ambulation.  Medication Inspection Compliance: [name] did not comply with our request to bring his pills to be counted. He was reminded that bringing the medication bottles, even when empty, is a requirement.  Medication: None brought in. Pill/Patch Count: None available to be counted. Bottle Appearance: No container available. Did not bring bottle(s) to appointment. Filled Date: N/A Last Medication intake:  Today 

## 2022-08-10 NOTE — Patient Instructions (Addendum)
____________________________________________________________________________________________  Pain Prevention Technique  Definition:   A technique used to minimize the effects of an activity known to cause inflammation or swelling, which in turn leads to an increase in pain.  Purpose: To prevent swelling from occurring. It is based on the fact that it is easier to prevent swelling from happening than it is to get rid of it, once it occurs.  Contraindications: Anyone with allergy or hypersensitivity to the recommended medications. Anyone taking anticoagulants (Blood Thinners) (e.g., Coumadin, Warfarin, Plavix, etc.). Patients in Renal Failure.  Technique: Before you undertake an activity known to cause pain, or a flare-up of your chronic pain, and before you experience any pain, do the following:  On a full stomach, take 4 (four) over the counter Ibuprofens 200mg  tablets (Motrin), for a total of 800 mg. In addition, take over the counter Magnesium 400 to 500 mg, before doing the activity.  Six (6) hours later, again on a full stomach, repeat the Ibuprofen. That night, take a warm shower and stretch under the running warm water.  This technique may be sufficient to abort the pain and discomfort before it happens. Keep in mind that it takes a lot less medication to prevent swelling than it takes to eliminate it once it occurs.  ____________________________________________________________________________________________    _______________________________________________________________________  Medication Rules  Purpose: To inform patients, and their family members, of our medication rules and regulations.  Applies to: All patients receiving prescriptions from our practice (written or electronic).  Pharmacy of record: This is the pharmacy where your electronic prescriptions will be sent. Make sure we have the correct one.  Electronic prescriptions: In compliance with the [employer] Strengthen Opioid Misuse Prevention (STOP) Act of 2017 (Session Conni Elliot [phone]), effective February 02, 2018, all controlled substances must be electronically prescribed. Written prescriptions, faxing, or calling prescriptions to a pharmacy will no longer be done.  Prescription refills: These will be provided only during in-person appointments. No medications will be renewed without a "face-to-face" evaluation with your provider. Applies to all prescriptions.  NOTE: The following applies primarily to controlled substances (Opioid* Pain Medications).   Type of encounter (visit): For patients receiving controlled substances, face-to-face visits are required. (Not an option and not up to the patient.)  Patient's responsibilities: Pain Pills: Bring all pain pills to every appointment (except for procedure appointments). Pill Bottles: Bring pills in original pharmacy bottle. Bring bottle, even if empty. Always bring the bottle of the most recent fill.  Medication refills: You are responsible for knowing and keeping track of what medications you are taking and when is it that you will need a refill. The day before your appointment: write a list of all prescriptions that need to be refilled. The day of the appointment: give the list to the admitting nurse. Prescriptions will be written only during appointments. No prescriptions will be written on procedure days. If you forget a medication: it will not be "Called in", "Faxed", or "electronically sent". You will need to get another appointment to get these prescribed. No early refills. Do not call asking to have your prescription filled early. Partial  or short prescriptions: Occasionally your pharmacy may not have enough pills to fill your prescription.  NEVER ACCEPT a partial fill or a prescription that is short of the total amount of pills that you were prescribed.  With controlled substances the law allows 72 hours for the pharmacy to complete  the prescription.  If the prescription is not completed within 72 hours, the  pharmacist will require a new prescription to be written. This means that you will be short on your medicine and we WILL NOT send another prescription to complete your original prescription.  Instead, request the pharmacy to send a carrier to a nearby branch to get enough medication to provide you with your full prescription. Prescription Accuracy: You are responsible for carefully inspecting your prescriptions before leaving our office. Have the discharge nurse carefully go over each prescription with you, before taking them home. Make sure that your name is accurately spelled, that your address is correct. Check the name and dose of your medication to make sure it is accurate. Check the number of pills, and the written instructions to make sure they are clear and accurate. Make sure that you are given enough medication to last until your next medication refill appointment. Taking Medication: Take medication as prescribed. When it comes to controlled substances, taking less pills or less frequently than prescribed is permitted and encouraged. Never take more pills than instructed. Never take the medication more frequently than prescribed.  Inform other Doctors: Always inform, all of your healthcare providers, of all the medications you take. Pain Medication from other Providers: You are not allowed to accept any additional pain medication from any other Doctor or Healthcare provider. There are two exceptions to this rule. (see below) In the event that you require additional pain medication, you are responsible for notifying us, as stated below. Cough Medicine: Often these contain an opioid, such as codeine or hydrocodone. Never accept or take cough medicine containing these opioids if you are already taking an opioid* medication. The combination may cause respiratory failure and death. Medication Agreement: You are responsible for  carefully reading and following our Medication Agreement. This must be signed before receiving any prescriptions from our practice. Safely store a copy of your signed Agreement. Violations to the Agreement will result in no further prescriptions. (Additional copies of our Medication Agreement are available upon request.) Laws, Rules, & Regulations: All patients are expected to follow all [address] and [employer], Rules, [place] [employer]. Ignorance of the Laws does not constitute a valid excuse.  Illegal drugs and Controlled Substances: The use of illegal substances (including, but not limited to marijuana and its derivatives) and/or the illegal use of any controlled substances is strictly prohibited. Violation of this rule may result in the immediate and permanent discontinuation of any and all prescriptions being written by our practice. The use of any illegal substances is prohibited. Adopted CDC guidelines & recommendations: Target dosing levels will be at or below 60 MME/day. Use of benzodiazepines** is not recommended.  Exceptions: There are only two exceptions to the rule of not receiving pain medications from other Healthcare Providers. Exception #1 (Emergencies): In the event of an emergency (i.e.: accident requiring emergency care), you are allowed to receive additional pain medication. However, you are responsible for: As soon as you are able, call our office [phone], at any time of the day or night, and leave a message stating your name, the date and nature of the emergency, and the name and dose of the medication prescribed. In the event that your call is answered by a member of our staff, make sure to document and save the date, time, and the name of the person that took your information.  Exception #2 (Planned Surgery): In the event that you are scheduled by another doctor or dentist to have any type of surgery or procedure, you are allowed (for a  period no longer than 30 days), to  receive additional pain medication, for the acute post-op pain. However, in this case, you are responsible for picking up a copy of our "Post-op Pain Management for Surgeons" handout, and giving it to your surgeon or dentist. This document is available at our office, and does not require an appointment to obtain it. Simply go to our office during business hours (Monday-Thursday from 8:00 AM to 4:00 PM) (Friday 8:00 AM to 12:00 Noon) or if you have a scheduled appointment with [place], prior to your surgery, and ask for it by name. In addition, you are responsible for: calling our office [phone], at any time of the day or night, and leaving a message stating your name, name of your surgeon, type of surgery, and date of procedure or surgery. Failure to comply with your responsibilities may result in termination of therapy involving the controlled substances. Medication Agreement Violation. Following the above rules, including your responsibilities will help you in avoiding a Medication Agreement Violation ("Breaking your Pain Medication Contract").  Consequences:  Not following the above rules may result in permanent discontinuation of medication prescription therapy.  *Opioid medications include: morphine, codeine, oxycodone, oxymorphone, hydrocodone, hydromorphone, meperidine, tramadol, tapentadol, buprenorphine, fentanyl, methadone. **Benzodiazepine medications include: diazepam (Valium), alprazolam (Xanax), clonazepam (Klonopine), lorazepam (Ativan), clorazepate (Tranxene), chlordiazepoxide (Librium), estazolam (Prosom), oxazepam (Serax), temazepam (Restoril), triazolam (Halcion) (Last updated: 11/25/2021) ______________________________________________________________________    ______________________________________________________________________  Medication Recommendations and Reminders  Applies to: All patients receiving prescriptions (written and/or electronic).  Medication Rules &  Regulations: You are responsible for reading, knowing, and following our "Medication Rules" document. These exist for your safety and that of others. They are not flexible and neither are we. Dismissing or ignoring them is an act of "non-compliance" that may result in complete and irreversible termination of such medication therapy. For safety reasons, "non-compliance" will not be tolerated. As with the [place] fundamental legal principle of "ignorance of the law is no defense", we will accept no excuses for not having read and knowing the content of documents provided to you by our practice.  Pharmacy of record:  Definition: This is the pharmacy where your electronic prescriptions will be sent.  We do not endorse any particular pharmacy. It is up to you and your insurance to decide what pharmacy to use.  We do not restrict you in your choice of pharmacy. However, once we write for your prescriptions, we will NOT be re-sending more prescriptions to fix restricted supply problems created by your pharmacy, or your insurance.  The pharmacy listed in the electronic medical record should be the one where you want electronic prescriptions to be sent. If you choose to change pharmacy, simply notify our nursing staff. Changes will be made only during your regular appointments and not over the phone.  Recommendations: Keep all of your pain medications in a safe place, under lock and key, even if you live alone. We will NOT replace lost, stolen, or damaged medication. We do not accept "Police Reports" as proof of medications having been stolen. After you fill your prescription, take 1 week's worth of pills and put them away in a safe place. You should keep a separate, properly labeled bottle for this purpose. The remainder should be kept in the original bottle. Use this as your primary supply, until it runs out. Once it's gone, then you know that you have 1 week's worth of medicine, and it is time to come in for a  prescription refill. If  you do this correctly, it is unlikely that you will ever run out of medicine. To make sure that the above recommendation works, it is very important that you make sure your medication refill appointments are scheduled at least 1 week before you run out of medicine. To do this in an effective manner, make sure that you do not leave the office without scheduling your next medication management appointment. Always ask the nursing staff to show you in your prescription , when your medication will be running out. Then arrange for the receptionist to get you a return appointment, at least 7 days before you run out of medicine. Do not wait until you have 1 or 2 pills left, to come in. This is very poor planning and does not take into consideration that we may need to cancel appointments due to bad weather, sickness, or emergencies affecting our staff. DO NOT ACCEPT A "Partial Fill": If for any reason your pharmacy does not have enough pills/tablets to completely fill or refill your prescription, do not allow for a "partial fill". The law allows the pharmacy to complete that prescription within 72 hours, without requiring a new prescription. If they do not fill the rest of your prescription within those 72 hours, you will need a separate prescription to fill the remaining amount, which we will NOT provide. If the reason for the partial fill is your insurance, you will need to talk to the pharmacist about payment alternatives for the remaining tablets, but again, DO NOT ACCEPT A PARTIAL FILL, unless you can trust your pharmacist to obtain the remainder of the pills within 72 hours.  Prescription refills and/or changes in medication(s):  Prescription refills, and/or changes in dose or medication, will be conducted only during scheduled medication management appointments. (Applies to both, written and electronic prescriptions.) No refills on procedure days. No medication will be changed or started  on procedure days. No changes, adjustments, and/or refills will be conducted on a procedure day. Doing so will interfere with the diagnostic portion of the procedure. No phone refills. No medications will be "called into the pharmacy". No Fax refills. No weekend refills. No Holliday refills. No after hours refills.  Remember:  Business hours are:  Monday to Thursday 8:00 AM to 4:00 PM Provider's Schedule: Delano Metz, MD - Appointments are:  Medication management: Monday and Wednesday 8:00 AM to 4:00 PM Procedure day: Tuesday and Thursday 7:30 AM to 4:00 PM Edward Jolly, MD - Appointments are:  Medication management: Tuesday and Thursday 8:00 AM to 4:00 PM Procedure day: Monday and Wednesday 7:30 AM to 4:00 PM (Last update: 11/25/2021) ______________________________________________________________________   ____________________________________________________________________________________________  Opioid Pain Medication Update  To: All patients taking opioid pain medications. (I.e.: hydrocodone, hydromorphone, oxycodone, oxymorphone, morphine, codeine, methadone, tapentadol, tramadol, buprenorphine, fentanyl, etc.)  Re: Updated review of side effects and adverse reactions of opioid analgesics, as well as new information about long term effects of this class of medications.  Direct risks of long-term opioid therapy are not limited to opioid addiction and overdose. Potential medical risks include serious fractures, breathing problems during sleep, hyperalgesia, immunosuppression, chronic constipation, bowel obstruction, myocardial infarction, and tooth decay secondary to xerostomia.  Unpredictable adverse effects that can occur even if you take your medication correctly: Cognitive impairment, respiratory depression, and death. Most people think that if they take their medication "correctly", and "as instructed", that they will be safe. Nothing could be farther from the truth. In  reality, a significant amount of recorded deaths associated with  the use of opioids has occurred in individuals that had taken the medication for a long time, and were taking their medication correctly. The following are examples of how this can happen: Patient taking his/her medication for a long time, as instructed, without any side effects, is given a certain antibiotic or another unrelated medication, which in turn triggers a "Drug-to-drug interaction" leading to disorientation, cognitive impairment, impaired reflexes, respiratory depression or an untoward event leading to serious bodily harm or injury, including death.  Patient taking his/her medication for a long time, as instructed, without any side effects, develops an acute impairment of liver and/or kidney function. This will lead to a rapid inability of the body to breakdown and eliminate their pain medication, which will result in effects similar to an "overdose", but with the same medicine and dose that they had always taken. This again may lead to disorientation, cognitive impairment, impaired reflexes, respiratory depression or an untoward event leading to serious bodily harm or injury, including death.  A similar problem will occur with patients as they grow older and their liver and kidney function begins to decrease as part of the aging process.  Background information: Historically, the original case for using long-term opioid therapy to treat chronic noncancer pain was based on safety assumptions that subsequent experience has called into question. In 1996, the American Pain Society and the American Academy of Pain Medicine issued a consensus statement supporting long-term opioid therapy. This statement acknowledged the dangers of opioid prescribing but concluded that the risk for addiction was low; respiratory depression induced by opioids was short-lived, occurred mainly in opioid-naive patients, and was antagonized by pain; tolerance was  not a common problem; and efforts to control diversion should not constrain opioid prescribing. This has now proven to be wrong. Experience regarding the risks for opioid addiction, misuse, and overdose in community practice has failed to support these assumptions.  According to the Centers for Disease Control and Prevention, fatal overdoses involving opioid analgesics have increased sharply over the past decade. Currently, more than 96,700 people die from drug overdoses every year. Opioids are a factor in 7 out of every 10 overdose deaths. Deaths from drug overdose have surpassed motor vehicle accidents as the leading cause of death for individuals between the ages of 61 and 29.  Clinical data suggest that neuroendocrine dysfunction may be very common in both men and women, potentially causing hypogonadism, erectile dysfunction, infertility, decreased libido, osteoporosis, and depression. Recent studies linked higher opioid dose to increased opioid-related mortality. Controlled observational studies reported that long-term opioid therapy may be associated with increased risk for cardiovascular events. Subsequent meta-analysis concluded that the safety of long-term opioid therapy in elderly patients has not been proven.   Side Effects and adverse reactions: Common side effects: Drowsiness (sedation). Dizziness. Nausea and vomiting. Constipation. Physical dependence -- Dependence often manifests with withdrawal symptoms when opioids are discontinued or decreased. Tolerance -- As you take repeated doses of opioids, you require increased medication to experience the same effect of pain relief. Respiratory depression -- This can occur in healthy people, especially with higher doses. However, people with COPD, asthma or other lung conditions may be even more susceptible to fatal respiratory impairment.  Uncommon side effects: An increased sensitivity to feeling pain and extreme response to pain  (hyperalgesia). Chronic use of opioids can lead to this. Delayed gastric emptying (the process by which the contents of your stomach are moved into your small intestine). Muscle rigidity. Immune system and hormonal dysfunction. Quick,  involuntary muscle jerks (myoclonus). Arrhythmia. Itchy skin (pruritus). Dry mouth (xerostomia).  Long-term side effects: Chronic constipation. Sleep-disordered breathing (SDB). Increased risk of bone fractures. Hypothalamic-pituitary-adrenal dysregulation. Increased risk of overdose.  RISKS: Respiratory depression and death: Opioids increase the risk of respiratory depression and death.  Drug-to-drug interactions: Opioids are relatively contraindicated in combination with benzodiazepines, sleep inducers, and other central nervous system depressants. Other classes of medications (i.e.: certain antibiotics and even over-the-counter medications) may also trigger or induce respiratory depression in some patients.  Medical conditions: Patients with pre-existing respiratory problems are at higher risk of respiratory failure and/or depression when in combination with opioid analgesics. Opioids are relatively contraindicated in some medical conditions such as central sleep apnea.   Fractures and Falls:  Opioids increase the risk and incidence of falls. This is of particular importance in elderly patients.  Endocrine System:  Long-term administration is associated with endocrine abnormalities (endocrinopathies). (Also known as Opioid-induced Endocrinopathy) Influences on both the hypothalamic-pituitary-adrenal axis?and the hypothalamic-pituitary-gonadal axis have been demonstrated with consequent hypogonadism and adrenal insufficiency in both sexes. Hypogonadism and decreased levels of dehydroepiandrosterone sulfate have been reported in men and women. Endocrine effects include: Amenorrhoea in women (abnormal absence of menstruation) Reduced libido in both  sexes Decreased sexual function Erectile dysfunction in men Hypogonadisms (decreased testicular function with shrinkage of testicles) Infertility Depression and fatigue Loss of muscle mass Anxiety Depression Immune suppression Hyperalgesia Weight gain Anemia Osteoporosis Patients (particularly women of childbearing age) should avoid opioids. There is insufficient evidence to recommend routine monitoring of asymptomatic patients taking opioids in the long-term for hormonal deficiencies.  Immune System: Human studies have demonstrated that opioids have an immunomodulating effect. These effects are mediated via opioid receptors both on immune effector cells and in the central nervous system. Opioids have been demonstrated to have adverse effects on antimicrobial response and anti-tumour surveillance. Buprenorphine has been demonstrated to have no impact on immune function.  Opioid Induced Hyperalgesia: Human studies have demonstrated that prolonged use of opioids can lead to a state of abnormal pain sensitivity, sometimes called opioid induced hyperalgesia (OIH). Opioid induced hyperalgesia is not usually seen in the absence of tolerance to opioid analgesia. Clinically, hyperalgesia may be diagnosed if the patient on long-term opioid therapy presents with increased pain. This might be qualitatively and anatomically distinct from pain related to disease progression or to breakthrough pain resulting from development of opioid tolerance. Pain associated with hyperalgesia tends to be more diffuse than the pre-existing pain and less defined in quality. Management of opioid induced hyperalgesia requires opioid dose reduction.  Cancer: Chronic opioid therapy has been associated with an increased risk of cancer among noncancer patients with chronic pain. This association was more evident in chronic strong opioid users. Chronic opioid consumption causes significant pathological changes in the small  intestine and colon. Epidemiological studies have found that there is a link between opium dependence and initiation of gastrointestinal cancers. Cancer is the second leading cause of death after cardiovascular disease. Chronic use of opioids can cause multiple conditions such as GERD, immunosuppression and renal damage as well as carcinogenic effects, which are associated with the incidence of cancers.   Mortality: Long-term opioid use has been associated with increased mortality among patients with chronic non-cancer pain (CNCP).  Prescription of long-acting opioids for chronic noncancer pain was associated with a significantly increased risk of all-cause mortality, including deaths from causes other than overdose.  Reference: Von Korff M, Kolodny A, Deyo RA, Chou R. Long-term opioid therapy reconsidered. Ann Intern Med.  2011 Sep 6;155(5):325-8. doi: 10.7326/[id number]. PMID: 16109604; PMCID: [id number]. Randon Goldsmith, Hayward RA, Dunn KM, [place] KP. Risk of adverse events in patients prescribed long-term opioids: A cohort study in the [place] [facility] Research Datalink. Eur J Pain. 2019 May;23(5):908-922. doi: 10.1002/ejp.1357. Epub 2019 Jan 31. PMID: 78295621. Colameco S, Coren JS, Ciervo CA. Continuous opioid treatment for chronic noncancer pain: a time for moderation in prescribing. Postgrad Med. 2009 Jul;121(4):61-6. doi: 10.3810/pgm.2009.07.2032. PMID: 30865784. William Hamburger RN, Cumings SD, Blazina I, Cristopher Peru, Bougatsos C, Deyo RA. The effectiveness and risks of long-term opioid therapy for chronic pain: a systematic review for a [employer] [facility] [employer] to [employer]. Ann Intern Med. 2015 Feb 17;162(4):276-86. doi: 10.7326/M14-2559. PMID: 69629528. [name] [facility], Makuc DM. NCHS Data Brief No. 22. [place]: Centers for Disease Control and Prevention; 2009. Sep, Increase in Fatal Poisonings Involving Opioid  Analgesics in the [place], 1999-2006. Song IA, Choi HR, Oh TK. Long-term opioid use and mortality in patients with chronic non-cancer pain: Ten-year follow-up study in [place] from 2010 through 2019. EClinicalMedicine. 2022 Jul 18;51:101558. doi: 10.1016/j.eclinm.2022.413244. PMID: 01027253; PMCID: [id number]. Huser, W., Schubert, T., Vogelmann, T. et al. All-cause mortality in patients with long-term opioid therapy compared with non-opioid analgesics for chronic non-cancer pain: a database study. BMC Med 18, 162 (2020). http://lester.info/ Rashidian H, Karie Kirks, Malekzadeh R, Haghdoost AA. An Ecological Study of the Association between Opiate Use and Incidence of Cancers. Addict Health. 2016 Fall;8(4):252-260. PMID: 59563875; PMCID: IEP3295188.  Our Goal: Our goal is to control your pain with means other than the use of opioid pain medications.  Our Recommendation: Talk to your physician about coming off of these medications. We can assist you with the tapering down and stopping these medicines. Based on the new information, even if you cannot completely stop the medication, a decrease in the dose may be associated with a lesser risk. Ask for other means of controlling the pain. Decrease or eliminate those factors that significantly contribute to your pain such as smoking, obesity, and a diet heavily tilted towards "inflammatory" nutrients.  Last Updated: 08/10/2022   ____________________________________________________________________________________________     ____________________________________________________________________________________________  Transfer of Pain Medication between Pharmacies  Re: 2023 DEA Clarification on existing regulation  Published on DEA Website: October 03, 2021  Title: Revised Regulation Allows DEA-Registered Pharmacies to Electrical engineer Prescriptions at a Patient's Request DEA Headquarters Division  - [employer]  "Patients now have the ability to request their electronic prescription be transferred to another pharmacy without having to go back to their practitioner to initiate the request. This revised regulation went into effect on Monday, September 29, 2021.     At a patient's request, a DEA-registered retail pharmacy can now transfer an electronic prescription for a controlled substance (schedules II-V) to another DEA-registered retail pharmacy. Prior to this change, patients would have to go through their practitioner to cancel their prescription and have it re-issued to a different pharmacy. The process was taxing and time consuming for both patients and practitioners.    The Drug Enforcement Administration [facility]) published its intent to revise the process for transferring electronic prescriptions on December 22, 2019.  The final rule was published in the federal register on August 28, 2021 and went into effect 30 days later.  Under the final rule, a prescription can only be transferred once between pharmacies, and only if allowed under existing state or other applicable law. The prescription  must remain in its electronic form; may not be altered in any way; and the transfer must be communicated directly between two licensed pharmacists. It's important to note, any authorized refills transfer with the original prescription, which means the entire prescription will be filled at the same pharmacy."    REFERENCES: 1. DEA website announcement HugeHand.is  2. Department of Justice website  CheapWipes.at.pdf  3. DEPARTMENT OF JUSTICE Drug Enforcement Administration 21 CFR Part 1306 [Docket No. [id number]] RIN [id number] "Transfer of Electronic Prescriptions for Schedules II-V Controlled Substances Between Pharmacies for Initial  Filling"  ____________________________________________________________________________________________

## 2022-08-11 DIAGNOSIS — R2681 Unsteadiness on feet: Secondary | ICD-10-CM | POA: Diagnosis not present

## 2022-08-11 DIAGNOSIS — M6281 Muscle weakness (generalized): Secondary | ICD-10-CM | POA: Diagnosis not present

## 2022-08-13 ENCOUNTER — Telehealth: Payer: Self-pay | Admitting: Family Medicine

## 2022-08-13 NOTE — Telephone Encounter (Signed)
Pt states the edema has been ongoing for a while but he did notice he had some swelling on Monday when he was up and moving around due to two doctor appts back to back. But didn't notice it during PT yesterday.   This Physical Therapist was new, and he doesn't feel that he needs to be evaluated right away. But his right leg gave away yesterday afternoon when he was preparing him food. He did have a partial fall when his leg gave out & he scrapped his toe. Pt will keep an eye on it & aware to call us if he needs [place] or to go to urgent care over the weekend if needed. I also offered him an appt tomorrow which he declined. He was very thankful of the attentiveness & concerns we have but he would much rather hold off for now.

## 2022-08-13 NOTE — Telephone Encounter (Signed)
Noted. Agree with advice given.

## 2022-08-13 NOTE — Telephone Encounter (Signed)
Pt called in stating that he's PT noticed that his legs are a little swollen. I offered appts so he can see provider on 08/19/22, he denied. So he pushed it to 09/22/22.

## 2022-08-14 ENCOUNTER — Ambulatory Visit: Payer: 59

## 2022-08-14 ENCOUNTER — Other Ambulatory Visit: Payer: Self-pay | Admitting: Family Medicine

## 2022-08-14 DIAGNOSIS — Z794 Long term (current) use of insulin: Secondary | ICD-10-CM

## 2022-08-19 ENCOUNTER — Ambulatory Visit: Payer: 59

## 2022-08-21 DIAGNOSIS — R2681 Unsteadiness on feet: Secondary | ICD-10-CM | POA: Diagnosis not present

## 2022-08-21 DIAGNOSIS — M6281 Muscle weakness (generalized): Secondary | ICD-10-CM | POA: Diagnosis not present

## 2022-08-24 ENCOUNTER — Ambulatory Visit: Payer: 59

## 2022-08-26 ENCOUNTER — Ambulatory Visit: Payer: 59 | Admitting: Physical Therapy

## 2022-08-28 ENCOUNTER — Other Ambulatory Visit: Payer: Self-pay | Admitting: Family Medicine

## 2022-08-28 DIAGNOSIS — R2681 Unsteadiness on feet: Secondary | ICD-10-CM | POA: Diagnosis not present

## 2022-08-28 DIAGNOSIS — M6281 Muscle weakness (generalized): Secondary | ICD-10-CM | POA: Diagnosis not present

## 2022-08-28 NOTE — Telephone Encounter (Signed)
NOV 09/2022

## 2022-09-02 ENCOUNTER — Ambulatory Visit: Payer: 59 | Admitting: Physical Therapy

## 2022-09-04 ENCOUNTER — Ambulatory Visit: Payer: 59

## 2022-09-07 DIAGNOSIS — Z794 Long term (current) use of insulin: Secondary | ICD-10-CM | POA: Diagnosis not present

## 2022-09-07 DIAGNOSIS — E118 Type 2 diabetes mellitus with unspecified complications: Secondary | ICD-10-CM | POA: Diagnosis not present

## 2022-09-07 DIAGNOSIS — E1165 Type 2 diabetes mellitus with hyperglycemia: Secondary | ICD-10-CM | POA: Diagnosis not present

## 2022-09-08 DIAGNOSIS — R2681 Unsteadiness on feet: Secondary | ICD-10-CM | POA: Diagnosis not present

## 2022-09-08 DIAGNOSIS — M6281 Muscle weakness (generalized): Secondary | ICD-10-CM | POA: Diagnosis not present

## 2022-09-08 NOTE — Patient Instructions (Signed)
____________________________________________________________________________________________  Opioid Pain Medication Update  To: All patients taking opioid pain medications. (I.e.: hydrocodone, hydromorphone, oxycodone, oxymorphone, morphine, codeine, methadone, tapentadol, tramadol, buprenorphine, fentanyl, etc.)  Re: Updated review of side effects and adverse reactions of opioid analgesics, as well as new information about long term effects of this class of medications.  Direct risks of long-term opioid therapy are not limited to opioid addiction and overdose. Potential medical risks include serious fractures, breathing problems during sleep, hyperalgesia, immunosuppression, chronic constipation, bowel obstruction, myocardial infarction, and tooth decay secondary to xerostomia.  Unpredictable adverse effects that can occur even if you take your medication correctly: Cognitive impairment, respiratory depression, and death. Most people think that if they take their medication "correctly", and "as instructed", that they will be safe. Nothing could be farther from the truth. In reality, a significant amount of recorded deaths associated with the use of opioids has occurred in individuals that had taken the medication for a long time, and were taking their medication correctly. The following are examples of how this can happen: Patient taking his/her medication for a long time, as instructed, without any side effects, is given a certain antibiotic or another unrelated medication, which in turn triggers a "Drug-to-drug interaction" leading to disorientation, cognitive impairment, impaired reflexes, respiratory depression or an untoward event leading to serious bodily harm or injury, including death.  Patient taking his/her medication for a long time, as instructed, without any side effects, develops an acute impairment of liver and/or kidney function. This will lead to a rapid inability of the body to  breakdown and eliminate their pain medication, which will result in effects similar to an "overdose", but with the same medicine and dose that they had always taken. This again may lead to disorientation, cognitive impairment, impaired reflexes, respiratory depression or an untoward event leading to serious bodily harm or injury, including death.  A similar problem will occur with patients as they grow older and their liver and kidney function begins to decrease as part of the aging process.  Background information: Historically, the original case for using long-term opioid therapy to treat chronic noncancer pain was based on safety assumptions that subsequent experience has called into question. In 1996, the American Pain Society and the American Academy of Pain Medicine issued a consensus statement supporting long-term opioid therapy. This statement acknowledged the dangers of opioid prescribing but concluded that the risk for addiction was low; respiratory depression induced by opioids was short-lived, occurred mainly in opioid-naive patients, and was antagonized by pain; tolerance was not a common problem; and efforts to control diversion should not constrain opioid prescribing. This has now proven to be wrong. Experience regarding the risks for opioid addiction, misuse, and overdose in community practice has failed to support these assumptions.  According to the Centers for Disease Control and Prevention, fatal overdoses involving opioid analgesics have increased sharply over the past decade. Currently, more than 96,700 people die from drug overdoses every year. Opioids are a factor in 7 out of every 10 overdose deaths. Deaths from drug overdose have surpassed motor vehicle accidents as the leading cause of death for individuals between the ages of 80 and 61.  Clinical data suggest that neuroendocrine dysfunction may be very common in both men and women, potentially causing hypogonadism, erectile  dysfunction, infertility, decreased libido, osteoporosis, and depression. Recent studies linked higher opioid dose to increased opioid-related mortality. Controlled observational studies reported that long-term opioid therapy may be associated with increased risk for cardiovascular events. Subsequent meta-analysis concluded  that the safety of long-term opioid therapy in elderly patients has not been proven.   Side Effects and adverse reactions: Common side effects: Drowsiness (sedation). Dizziness. Nausea and vomiting. Constipation. Physical dependence -- Dependence often manifests with withdrawal symptoms when opioids are discontinued or decreased. Tolerance -- As you take repeated doses of opioids, you require increased medication to experience the same effect of pain relief. Respiratory depression -- This can occur in healthy people, especially with higher doses. However, people with COPD, asthma or other lung conditions may be even more susceptible to fatal respiratory impairment.  Uncommon side effects: An increased sensitivity to feeling pain and extreme response to pain (hyperalgesia). Chronic use of opioids can lead to this. Delayed gastric emptying (the process by which the contents of your stomach are moved into your small intestine). Muscle rigidity. Immune system and hormonal dysfunction. Quick, involuntary muscle jerks (myoclonus). Arrhythmia. Itchy skin (pruritus). Dry mouth (xerostomia).  Long-term side effects: Chronic constipation. Sleep-disordered breathing (SDB). Increased risk of bone fractures. Hypothalamic-pituitary-adrenal dysregulation. Increased risk of overdose.  RISKS: Respiratory depression and death: Opioids increase the risk of respiratory depression and death.  Drug-to-drug interactions: Opioids are relatively contraindicated in combination with benzodiazepines, sleep inducers, and other central nervous system depressants. Other classes of medications  (i.e.: certain antibiotics and even over-the-counter medications) may also trigger or induce respiratory depression in some patients.  Medical conditions: Patients with pre-existing respiratory problems are at higher risk of respiratory failure and/or depression when in combination with opioid analgesics. Opioids are relatively contraindicated in some medical conditions such as central sleep apnea.   Fractures and Falls:  Opioids increase the risk and incidence of falls. This is of particular importance in elderly patients.  Endocrine System:  Long-term administration is associated with endocrine abnormalities (endocrinopathies). (Also known as Opioid-induced Endocrinopathy) Influences on both the hypothalamic-pituitary-adrenal axis?and the hypothalamic-pituitary-gonadal axis have been demonstrated with consequent hypogonadism and adrenal insufficiency in both sexes. Hypogonadism and decreased levels of dehydroepiandrosterone sulfate have been reported in men and women. Endocrine effects include: Amenorrhoea in women (abnormal absence of menstruation) Reduced libido in both sexes Decreased sexual function Erectile dysfunction in men Hypogonadisms (decreased testicular function with shrinkage of testicles) Infertility Depression and fatigue Loss of muscle mass Anxiety Depression Immune suppression Hyperalgesia Weight gain Anemia Osteoporosis Patients (particularly women of childbearing age) should avoid opioids. There is insufficient evidence to recommend routine monitoring of asymptomatic patients taking opioids in the long-term for hormonal deficiencies.  Immune System: Human studies have demonstrated that opioids have an immunomodulating effect. These effects are mediated via opioid receptors both on immune effector cells and in the central nervous system. Opioids have been demonstrated to have adverse effects on antimicrobial response and anti-tumour surveillance. Buprenorphine has  been demonstrated to have no impact on immune function.  Opioid Induced Hyperalgesia: Human studies have demonstrated that prolonged use of opioids can lead to a state of abnormal pain sensitivity, sometimes called opioid induced hyperalgesia (OIH). Opioid induced hyperalgesia is not usually seen in the absence of tolerance to opioid analgesia. Clinically, hyperalgesia may be diagnosed if the patient on long-term opioid therapy presents with increased pain. This might be qualitatively and anatomically distinct from pain related to disease progression or to breakthrough pain resulting from development of opioid tolerance. Pain associated with hyperalgesia tends to be more diffuse than the pre-existing pain and less defined in quality. Management of opioid induced hyperalgesia requires opioid dose reduction.  Cancer: Chronic opioid therapy has been associated with an increased risk of cancer  among noncancer patients with chronic pain. This association was more evident in chronic strong opioid users. Chronic opioid consumption causes significant pathological changes in the small intestine and colon. Epidemiological studies have found that there is a link between opium dependence and initiation of gastrointestinal cancers. Cancer is the second leading cause of death after cardiovascular disease. Chronic use of opioids can cause multiple conditions such as GERD, immunosuppression and renal damage as well as carcinogenic effects, which are associated with the incidence of cancers.   Mortality: Long-term opioid use has been associated with increased mortality among patients with chronic non-cancer pain (CNCP).  Prescription of long-acting opioids for chronic noncancer pain was associated with a significantly increased risk of all-cause mortality, including deaths from causes other than overdose.  Reference: Von Korff M, Kolodny A, Deyo RA, Chou R. Long-term opioid therapy reconsidered. Ann Intern Med. 2011  Sep 6;155(5):325-8. doi: 10.7326/[id number]. PMID: 64403474; PMCID: [id number]. Randon Goldsmith, Hayward RA, Dunn KM, [place] KP. Risk of adverse events in patients prescribed long-term opioids: A cohort study in the [place] [facility] Research Datalink. Eur J Pain. 2019 May;23(5):908-922. doi: 10.1002/ejp.1357. Epub 2019 Jan 31. PMID: 43329518. Colameco S, Coren JS, Ciervo CA. Continuous opioid treatment for chronic noncancer pain: a time for moderation in prescribing. Postgrad Med. 2009 Jul;121(4):61-6. doi: 10.3810/pgm.2009.07.2032. PMID: 84166063. William Hamburger RN, [place] SD, Blazina I, Cristopher Peru, Bougatsos C, Deyo RA. The effectiveness and risks of long-term opioid therapy for chronic pain: a systematic review for a [employer] of [employer] Pathways to [employer]. Ann Intern Med. 2015 Feb 17;162(4):276-86. doi: 10.7326/M14-2559. PMID: 01601093. [name] [facility], Makuc DM. NCHS Data Brief No. 22. [place]: Centers for Disease Control and Prevention; 2009. Sep, Increase in Fatal Poisonings Involving Opioid Analgesics in the [place], 1999-2006. Song IA, Choi HR, Oh TK. Long-term opioid use and mortality in patients with chronic non-cancer pain: Ten-year follow-up study in [place] from 2010 through 2019. EClinicalMedicine. 2022 Jul 18;51:101558. doi: 10.1016/j.eclinm.2022.235573. PMID: 22025427; PMCID: [id number]. Huser, W., Schubert, T., Vogelmann, T. et al. All-cause mortality in patients with long-term opioid therapy compared with non-opioid analgesics for chronic non-cancer pain: a database study. BMC Med 18, 162 (2020). http://lester.info/ Rashidian H, Karie Kirks, Malekzadeh R, Haghdoost AA. An Ecological Study of the Association between Opiate Use and Incidence of Cancers. Addict Health. 2016 Fall;8(4):252-260. PMID: 51761607; PMCID: [id number].  Our Goal: Our goal is to control your  pain with means other than the use of opioid pain medications.  Our Recommendation: Talk to your physician about coming off of these medications. We can assist you with the tapering down and stopping these medicines. Based on the new information, even if you cannot completely stop the medication, a decrease in the dose may be associated with a lesser risk. Ask for other means of controlling the pain. Decrease or eliminate those factors that significantly contribute to your pain such as smoking, obesity, and a diet heavily tilted towards "inflammatory" nutrients.  Last Updated: 08/10/2022   ____________________________________________________________________________________________     ____________________________________________________________________________________________  National Pain Medication Shortage  The [place] is experiencing worsening drug shortages. These have had a negative widespread effect on patient care and treatment. Not expected to improve any time soon. Predicted to last past 2029.   Drug shortage list (generic names) Oxycodone IR Oxycodone/APAP Oxymorphone IR Hydromorphone Hydrocodone/APAP Morphine  Where is the problem?  Manufacturing and supply level.  Will this shortage affect you?  Only if you  take any of the above pain medications.  How? You may be unable to fill your prescription.  Your pharmacist may offer a "partial fill" of your prescription. (Warning: Do not accept partial fills.) Prescriptions partially filled cannot be transferred to another pharmacy. Read our Medication Rules and Regulation. Depending on how much medicine you are dependent on, you may experience withdrawals when unable to get the medication.  Recommendations: Consider ending your dependence on opioid pain medications. Ask your pain specialist to assist you with the process. Consider switching to a medication currently not in shortage, such as Buprenorphine. Talk to your pain  specialist about this option. Consider decreasing your pain medication requirements by managing tolerance thru "Drug Holidays". This may help minimize withdrawals, should you run out of medicine. Control your pain thru the use of non-pharmacological interventional therapies.   Your prescriber: Prescribers cannot be blamed for shortages. Medication manufacturing and supply issues cannot be fixed by the prescriber.   NOTE: The prescriber is not responsible for supplying the medication, or solving supply issues. Work with your pharmacist to solve it. The patient is responsible for the decision to take or continue taking the medication and for identifying and securing a legal supply source. By law, supplying the medication is the job and responsibility of the pharmacy. The prescriber is responsible for the evaluation, monitoring, and prescribing of these medications.   Prescribers will NOT: Re-issue prescriptions that have been partially filled. Re-issue prescriptions already sent to a pharmacy.  Re-send prescriptions to a different pharmacy because yours did not have your medication. Ask pharmacist to order more medicine or transfer the prescription to another pharmacy. (Read below.)  New 2023 regulation: "October 03, 2021 Revised Regulation Allows DEA-Registered Pharmacies to Transfer Electronic Prescriptions at a Patient's Request DEA Headquarters Division - Public Information Office Patients now have the ability to request their electronic prescription be transferred to another pharmacy without having to go back to their practitioner to initiate the request. This revised regulation went into effect on Monday, September 29, 2021.     At a patient's request, a DEA-registered retail pharmacy can now transfer an electronic prescription for a controlled substance (schedules II-V) to another DEA-registered retail pharmacy. Prior to this change, patients would have to go through their practitioner to  cancel their prescription and have it re-issued to a different pharmacy. The process was taxing and time consuming for both patients and practitioners.    The Drug Enforcement Administration [facility]) published its intent to revise the process for transferring electronic prescriptions on December 22, 2019.  The final rule was published in the federal register on August 28, 2021 and went into effect 30 days later.  Under the final rule, a prescription can only be transferred once between pharmacies, and only if allowed under existing state or other applicable law. The prescription must remain in its electronic form; may not be altered in any way; and the transfer must be communicated directly between two licensed pharmacists. It's important to note, any authorized refills transfer with the original prescription, which means the entire prescription will be filled at the same pharmacy".  Reference: HugeHand.is [facility] website announcement)  [email or web address] [employer] of Justice)   [employer] / Vol. 88, No. 143 / Thursday, August 28, 2021 / Rules and Regulations DEPARTMENT OF JUSTICE  Drug Enforcement Administration  21 CFR Part 1306  [Docket No. [id number]]  RIN [id number] Transfer of Electronic Prescriptions for Schedules II-V Controlled Substances Between Pharmacies for Initial Filling  ____________________________________________________________________________________________  ____________________________________________________________________________________________  Transfer of Pain Medication between Pharmacies  Re: 2023 DEA Clarification on existing regulation  Published on DEA Website: October 03, 2021  Title: Revised Regulation Allows DEA-Registered Pharmacies to Electrical engineer Prescriptions at a Patient's  Request DEA Headquarters Division - [employer]  "Patients now have the ability to request their electronic prescription be transferred to another pharmacy without having to go back to their practitioner to initiate the request. This revised regulation went into effect on Monday, September 29, 2021.     At a patient's request, a DEA-registered retail pharmacy can now transfer an electronic prescription for a controlled substance (schedules II-V) to another DEA-registered retail pharmacy. Prior to this change, patients would have to go through their practitioner to cancel their prescription and have it re-issued to a different pharmacy. The process was taxing and time consuming for both patients and practitioners.    The Drug Enforcement Administration [facility]) published its intent to revise the process for transferring electronic prescriptions on December 22, 2019.  The final rule was published in the federal register on August 28, 2021 and went into effect 30 days later.  Under the final rule, a prescription can only be transferred once between pharmacies, and only if allowed under existing state or other applicable law. The prescription must remain in its electronic form; may not be altered in any way; and the transfer must be communicated directly between two licensed pharmacists. It's important to note, any authorized refills transfer with the original prescription, which means the entire prescription will be filled at the same pharmacy."    REFERENCES: 1. DEA website announcement [email or web address]  2. Department of Justice website  [email or web address].pdf  3. DEPARTMENT OF JUSTICE Drug Enforcement Administration 21 CFR Part 1306 [Docket No. [id number]] RIN [id number] "Transfer of Electronic Prescriptions for Schedules II-V Controlled Substances  Between Pharmacies for Initial Filling"  ____________________________________________________________________________________________     _______________________________________________________________________  Medication Rules  Purpose: To inform patients, and their family members, of our medication rules and regulations.  Applies to: All patients receiving prescriptions from our practice (written or electronic).  Pharmacy of record: This is the pharmacy where your electronic prescriptions will be sent. Make sure we have the correct one.  Electronic prescriptions: In compliance with the [facility] Strengthen Opioid Misuse Prevention (STOP) Act of 2017 (Session Conni Elliot [phone]), effective February 02, 2018, all controlled substances must be electronically prescribed. Written prescriptions, faxing, or calling prescriptions to a pharmacy will no longer be done.  Prescription refills: These will be provided only during in-person appointments. No medications will be renewed without a "face-to-face" evaluation with your provider. Applies to all prescriptions.  NOTE: The following applies primarily to controlled substances (Opioid* Pain Medications).   Type of encounter (visit): For patients receiving controlled substances, face-to-face visits are required. (Not an option and not up to the patient.)  Patient's responsibilities: Pain Pills: Bring all pain pills to every appointment (except for procedure appointments). Pill Bottles: Bring pills in original pharmacy bottle. Bring bottle, even if empty. Always bring the bottle of the most recent fill.  Medication refills: You are responsible for knowing and keeping track of what medications you are taking and when is it that you will need a refill. The day before your appointment: write a list of all prescriptions that need to be refilled. The day of the appointment: give the list to the admitting nurse. Prescriptions will be written only  during appointments. No prescriptions will be written on procedure days. If you forget a  medication: it will not be "Called in", "Faxed", or "electronically sent". You will need to get another appointment to get these prescribed. No early refills. Do not call asking to have your prescription filled early. Partial  or short prescriptions: Occasionally your pharmacy may not have enough pills to fill your prescription.  NEVER ACCEPT a partial fill or a prescription that is short of the total amount of pills that you were prescribed.  With controlled substances the law allows 72 hours for the pharmacy to complete the prescription.  If the prescription is not completed within 72 hours, the pharmacist will require a new prescription to be written. This means that you will be short on your medicine and we WILL NOT send another prescription to complete your original prescription.  Instead, request the pharmacy to send a carrier to a nearby branch to get enough medication to provide you with your full prescription. Prescription Accuracy: You are responsible for carefully inspecting your prescriptions before leaving our office. Have the discharge nurse carefully go over each prescription with you, before taking them home. Make sure that your name is accurately spelled, that your address is correct. Check the name and dose of your medication to make sure it is accurate. Check the number of pills, and the written instructions to make sure they are clear and accurate. Make sure that you are given enough medication to last until your next medication refill appointment. Taking Medication: Take medication as prescribed. When it comes to controlled substances, taking less pills or less frequently than prescribed is permitted and encouraged. Never take more pills than instructed. Never take the medication more frequently than prescribed.  Inform other Doctors: Always inform, all of your healthcare providers, of all the  medications you take. Pain Medication from other Providers: You are not allowed to accept any additional pain medication from any other Doctor or Healthcare provider. There are two exceptions to this rule. (see below) In the event that you require additional pain medication, you are responsible for notifying us, as stated below. Cough Medicine: Often these contain an opioid, such as codeine or hydrocodone. Never accept or take cough medicine containing these opioids if you are already taking an opioid* medication. The combination may cause respiratory failure and death. Medication Agreement: You are responsible for carefully reading and following our Medication Agreement. This must be signed before receiving any prescriptions from our practice. Safely store a copy of your signed Agreement. Violations to the Agreement will result in no further prescriptions. (Additional copies of our Medication Agreement are available upon request.) Laws, Rules, & Regulations: All patients are expected to follow all [address] and [employer], [employer], Rules, [place] [place]. Ignorance of the Laws does not constitute a valid excuse.  Illegal drugs and Controlled Substances: The use of illegal substances (including, but not limited to marijuana and its derivatives) and/or the illegal use of any controlled substances is strictly prohibited. Violation of this rule may result in the immediate and permanent discontinuation of any and all prescriptions being written by our practice. The use of any illegal substances is prohibited. Adopted CDC guidelines & recommendations: Target dosing levels will be at or below 60 MME/day. Use of benzodiazepines** is not recommended.  Exceptions: There are only two exceptions to the rule of not receiving pain medications from other Healthcare Providers. Exception #1 (Emergencies): In the event of an emergency (i.e.: accident requiring emergency care), you are allowed to receive additional pain  medication. However, you are responsible for: As soon as  you are able, call our office [phone], at any time of the day or night, and leave a message stating your name, the date and nature of the emergency, and the name and dose of the medication prescribed. In the event that your call is answered by a member of our staff, make sure to document and save the date, time, and the name of the person that took your information.  Exception #2 (Planned Surgery): In the event that you are scheduled by another doctor or dentist to have any type of surgery or procedure, you are allowed (for a period no longer than 30 days), to receive additional pain medication, for the acute post-op pain. However, in this case, you are responsible for picking up a copy of our "Post-op Pain Management for Surgeons" handout, and giving it to your surgeon or dentist. This document is available at our office, and does not require an appointment to obtain it. Simply go to our office during business hours (Monday-Thursday from 8:00 AM to 4:00 PM) (Friday 8:00 AM to 12:00 Noon) or if you have a scheduled appointment with [place], prior to your surgery, and ask for it by name. In addition, you are responsible for: calling our office [phone], at any time of the day or night, and leaving a message stating your name, name of your surgeon, type of surgery, and date of procedure or surgery. Failure to comply with your responsibilities may result in termination of therapy involving the controlled substances. Medication Agreement Violation. Following the above rules, including your responsibilities will help you in avoiding a Medication Agreement Violation ("Breaking your Pain Medication Contract").  Consequences:  Not following the above rules may result in permanent discontinuation of medication prescription therapy.  *Opioid medications include: morphine, codeine, oxycodone, oxymorphone, hydrocodone, hydromorphone, meperidine, tramadol,  tapentadol, buprenorphine, fentanyl, methadone. **Benzodiazepine medications include: diazepam (Valium), alprazolam (Xanax), clonazepam (Klonopine), lorazepam (Ativan), clorazepate (Tranxene), chlordiazepoxide (Librium), estazolam (Prosom), oxazepam (Serax), temazepam (Restoril), triazolam (Halcion) (Last updated: 11/25/2021) ______________________________________________________________________    ______________________________________________________________________  Medication Recommendations and Reminders  Applies to: All patients receiving prescriptions (written and/or electronic).  Medication Rules & Regulations: You are responsible for reading, knowing, and following our "Medication Rules" document. These exist for your safety and that of others. They are not flexible and neither are we. Dismissing or ignoring them is an act of "non-compliance" that may result in complete and irreversible termination of such medication therapy. For safety reasons, "non-compliance" will not be tolerated. As with the [place] fundamental legal principle of "ignorance of the law is no defense", we will accept no excuses for not having read and knowing the content of documents provided to you by our practice.  Pharmacy of record:  Definition: This is the pharmacy where your electronic prescriptions will be sent.  We do not endorse any particular pharmacy. It is up to you and your insurance to decide what pharmacy to use.  We do not restrict you in your choice of pharmacy. However, once we write for your prescriptions, we will NOT be re-sending more prescriptions to fix restricted supply problems created by your pharmacy, or your insurance.  The pharmacy listed in the electronic medical record should be the one where you want electronic prescriptions to be sent. If you choose to change pharmacy, simply notify our nursing staff. Changes will be made only during your regular appointments and not over the  phone.  Recommendations: Keep all of your pain medications in a safe place, under lock and key, even  if you live alone. We will NOT replace lost, stolen, or damaged medication. We do not accept "Police Reports" as proof of medications having been stolen. After you fill your prescription, take 1 week's worth of pills and put them away in a safe place. You should keep a separate, properly labeled bottle for this purpose. The remainder should be kept in the original bottle. Use this as your primary supply, until it runs out. Once it's gone, then you know that you have 1 week's worth of medicine, and it is time to come in for a prescription refill. If you do this correctly, it is unlikely that you will ever run out of medicine. To make sure that the above recommendation works, it is very important that you make sure your medication refill appointments are scheduled at least 1 week before you run out of medicine. To do this in an effective manner, make sure that you do not leave the office without scheduling your next medication management appointment. Always ask the nursing staff to show you in your prescription , when your medication will be running out. Then arrange for the receptionist to get you a return appointment, at least 7 days before you run out of medicine. Do not wait until you have 1 or 2 pills left, to come in. This is very poor planning and does not take into consideration that we may need to cancel appointments due to bad weather, sickness, or emergencies affecting our staff. DO NOT ACCEPT A "Partial Fill": If for any reason your pharmacy does not have enough pills/tablets to completely fill or refill your prescription, do not allow for a "partial fill". The law allows the pharmacy to complete that prescription within 72 hours, without requiring a new prescription. If they do not fill the rest of your prescription within those 72 hours, you will need a separate prescription to fill the remaining  amount, which we will NOT provide. If the reason for the partial fill is your insurance, you will need to talk to the pharmacist about payment alternatives for the remaining tablets, but again, DO NOT ACCEPT A PARTIAL FILL, unless you can trust your pharmacist to obtain the remainder of the pills within 72 hours.  Prescription refills and/or changes in medication(s):  Prescription refills, and/or changes in dose or medication, will be conducted only during scheduled medication management appointments. (Applies to both, written and electronic prescriptions.) No refills on procedure days. No medication will be changed or started on procedure days. No changes, adjustments, and/or refills will be conducted on a procedure day. Doing so will interfere with the diagnostic portion of the procedure. No phone refills. No medications will be "called into the pharmacy". No Fax refills. No weekend refills. No Holliday refills. No after hours refills.  Remember:  Business hours are:  Monday to Thursday 8:00 AM to 4:00 PM Provider's Schedule: Delano Metz, MD - Appointments are:  Medication management: Monday and Wednesday 8:00 AM to 4:00 PM Procedure day: Tuesday and Thursday 7:30 AM to 4:00 PM Edward Jolly, MD - Appointments are:  Medication management: Tuesday and Thursday 8:00 AM to 4:00 PM Procedure day: Monday and Wednesday 7:30 AM to 4:00 PM (Last update: 11/25/2021) ______________________________________________________________________   ____________________________________________________________________________________________  Naloxone Nasal Spray  Why am I receiving this medication? [place] STOP ACT requires that all patients taking high dose opioids or at risk of opioids respiratory depression, be prescribed an opioid reversal agent, such as Naloxone (AKA: Narcan).  What is this medication? NALOXONE (  nal OX one) treats opioid overdose, which causes slow or shallow breathing,  severe drowsiness, or trouble staying awake. Call emergency services after using this medication. You may need additional treatment. Naloxone works by reversing the effects of opioids. It belongs to a group of medications called opioid blockers.  COMMON BRAND NAME(S): Kloxxado, Narcan  What should I tell my care team before I take this medication? They need to know if you have any of these conditions: Heart disease Substance use disorder An unusual or allergic reaction to naloxone, other medications, foods, dyes, or preservatives Pregnant or trying to get pregnant Breast-feeding  When to use this medication? This medication is to be used for the treatment of respiratory depression (less than 8 breaths per minute) secondary to opioid overdose.   How to use this medication? This medication is for use in the nose. Lay the person on their back. Support their neck with your hand and allow the head to tilt back before giving the medication. The nasal spray should be given into 1 nostril. After giving the medication, move the person onto their side. Do not remove or test the nasal spray until ready to use. Get emergency medical help right away after giving the first dose of this medication, even if the person wakes up. You should be familiar with how to recognize the signs and symptoms of a narcotic overdose. If more doses are needed, give the additional dose in the other nostril. Talk to your care team about the use of this medication in children. While this medication may be prescribed for children as young as newborns for selected conditions, precautions do apply.  Naloxone Overdosage: If you think you have taken too much of this medicine contact a poison control center or emergency room at once.  NOTE: This medicine is only for you. Do not share this medicine with others.  What if I miss a dose? This does not apply.  What may interact with this medication? This is only used during an  emergency. No interactions are expected during emergency use. This list may not describe all possible interactions. Give your health care provider a list of all the medicines, herbs, non-prescription drugs, or dietary supplements you use. Also tell them if you smoke, drink alcohol, or use illegal drugs. Some items may interact with your medicine.  What should I watch for while using this medication? Keep this medication ready for use in the case of an opioid overdose. Make sure that you have the phone number of your care team and local hospital ready. You may need to have additional doses of this medication. Each nasal spray contains a single dose. Some emergencies may require additional doses. After use, bring the treated person to the nearest hospital or call 911. Make sure the treating care team knows that the person has received a dose of this medication. You will receive additional instructions on what to do during and after use of this medication before an emergency occurs.  What side effects may I notice from receiving this medication? Side effects that you should report to your care team as soon as possible: Allergic reactions--skin rash, itching, hives, swelling of the face, lips, tongue, or throat Side effects that usually do not require medical attention (report these to your care team if they continue or are bothersome): Constipation Dryness or irritation inside the nose Headache Increase in blood pressure Muscle spasms Stuffy nose Toothache This list may not describe all possible side effects. Call your doctor for  medical advice about side effects. You may report side effects to FDA at 1-800-FDA-1088.  Where should I keep my medication? Because this is an emergency medication, you should keep it with you at all times.  Keep out of the reach of children and pets. Store between 20 and 25 degrees C (68 and 77 degrees F). Do not freeze. Throw away any unused medication after the  expiration date. Keep in original box until ready to use.  NOTE: This sheet is a summary. It may not cover all possible information. If you have questions about this medicine, talk to your doctor, pharmacist, or health care provider.   2023 [employer]/Gold Standard (2020-09-27 00:00:00)  ____________________________________________________________________________________________

## 2022-09-08 NOTE — Progress Notes (Unsigned)
PROVIDER NOTE: Information contained herein reflects review and annotations entered in association with encounter. Interpretation of such information and data should be left to medically-trained personnel. Information provided to patient can be located elsewhere in the medical record under "Patient Instructions". Document created using STT-dictation technology, any transcriptional errors that may result from process are unintentional.    Patient: Alan Schmidt  Service Category: E/M  Provider: Oswaldo Done, MD  DOB: 19-Oct-1945  DOS: 09/09/2022  Referring Provider: Glori Luis, MD  MRN: [id number]  Specialty: Interventional Pain Management  PCP: Glori Luis, MD  Type: Established Patient  Setting: Ambulatory outpatient    Location: Office  Delivery: Face-to-face     HPI  Alan Schmidt, a 77 y.o. year old male, is here today because of his Chronic pain syndrome [G89.4]. Alan Schmidt primary complain today is No chief complaint on file.  Pertinent problems: Alan Schmidt has Chronic low back pain (Bilateral) (R>L) w/ sciatica (Bilateral); Lumbar facet syndrome (Bilateral) (R>L); Lumbar spondylosis; Diabetic peripheral neuropathy (HCC); Chronic neck pain (midline over the C7 spinous processes) (L>R); Neurogenic pain; Neuropathic pain; Chronic lower extremity pain (2ry area of Pain) (Bilateral) (R>L); 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) (Right L5 dermatome); History of total hip replacement (Right); Chronic hip pain (Right); Abnormal nerve conduction studies (severe bilateral lower extremity polyneuropathy); Chronic shoulder pain (Bilateral) (R>L); Occipital neuralgia (Left); Cervicogenic headache (Left); Chronic shoulder radicular pain (Left); Chronic cervical radicular pain (Left); Chronic pain syndrome; Lumbar facet joint osteoarthritis (Bilateral); Chronic headache; Chronic tension-type headache, intractable; Coccygodynia; DDD (degenerative disc disease), lumbar;  Chronic musculoskeletal pain; Chronic knee pain (Bilateral); Thumb pain (Right); 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 Pain Assessment: Severity of   is reported as a  /10. Location:    / . Onset:  . Quality:  . Timing:  . Modifying factor(s):  Marland Kitchen Vitals:  vitals were not taken for this visit.  BMI: Estimated body mass index is 34.58 kg/m as calculated from the following:   Height as of 08/10/22: 6' (1.829 m).   Weight as of 08/10/22: 255 lb (115.7 kg). Last encounter: 08/10/2022. Last procedure: 04/14/2022.  Reason for encounter: medication management. ***  RTCB: 12/08/2022   Pharmacotherapy Assessment  Analgesic: Morphine ER (MS Contin) 30 mg, 1 tab p.o. twice daily (60 mg/day).  (Previously on oxycodone IR 10 mg every 6 hours (40 mg/day)) MME/day: 60 mg/day.   Monitoring: [place] PMP: PDMP reviewed during this encounter.       Pharmacotherapy: No side-effects or adverse reactions reported. Compliance: No problems identified. Effectiveness: Clinically acceptable.  No notes on file  No results found for: "CBDTHCR" No results found for: "D8THCCBX" No results found for: "D9THCCBX"  UDS:  Summary  Date Value Ref Range Status  07/08/2022 Note  Final    Comment:    ==================================================================== [employer] [facility] 13 (MW) ==================================================================== Test                             Result       Flag       Units  Drug Present and Declared for Prescription Verification  Alprazolam                      58           EXPECTED   ng/mg creat   Alpha-hydroxyalprazolam        142          EXPECTED   ng/mg creat    Source of alprazolam is a scheduled prescription medication. Alpha-    hydroxyalprazolam is an expected metabolite of alprazolam.    Morphine                       15430        EXPECTED   ng/mg creat   Normorphine                    570          EXPECTED   ng/mg creat    Potential sources of large amounts of morphine in the absence of    codeine include administration of morphine or use of heroin.     Normorphine is an expected metabolite of morphine.    Hydromorphone                  362          EXPECTED   ng/mg creat    Hydromorphone may be present as a metabolite of morphine;    concentrations of hydromorphone rarely exceed 5% of the morphine    concentration when this is the source of hydromorphone.  ==================================================================== Test                      Result    Flag   Units      Ref Range   Creatinine              50               mg/dL      >=14 ==================================================================== Declared Medications:  The flagging and interpretation on this report are based on the  following declared medications.  Unexpected results may arise from  inaccuracies in the declared medications.   **Note: The testing scope of this panel includes these medications:   Alprazolam  Morphine (MS Contin)   **Note: The testing scope of this panel does not include the  following reported medications:   Acetaminophen (Tylenol)  Aspirin  Bupropion (Wellbutrin XL)  Carvedilol (Coreg)  Desoximetasone (Topicort)  Empagliflozin (Jardiance)  Epinephrine (EpiPen)  Escitalopram (Lexapro)  Gabapentin (Neurontin)  Hydrochlorothiazide  Insulin Evaristo Bury)  Loratadine (Claritin)  Lubiprostone (Amitiza)  Metformin  Methocarbamol  Mometasone (Nasonex)  Naloxone (Narcan)  Olmesartan (Benicar)  Pantoprazole  (Protonix)  Rosuvastatin (Crestor)  Supplement (Fiber)  Tamsulosin (Flomax)  Tirzepatide (Mounjaro) ==================================================================== For clinical consultation, please call [phone]. ====================================================================       ROS  Constitutional: Denies any fever or chills Gastrointestinal: No reported hemesis, hematochezia, vomiting, or acute GI distress Musculoskeletal: Denies any acute onset joint swelling, redness, loss of ROM, or weakness Neurological: No reported episodes of acute onset apraxia, aphasia, dysarthria, agnosia, amnesia, paralysis, loss of coordination, or loss of consciousness  Medication Review  ALPRAZolam, Benefiber, EPINEPHrine, FreeStyle Libre 14 Day Reader, FreeStyle Libre 14 Day Sensor, Insulin Pen Needle, acetaminophen, aspirin EC, buPROPion, carvedilol, desoximetasone, empagliflozin, escitalopram, gabapentin, glucose blood, hydrochlorothiazide, insulin degludec, insulin lispro, loratadine, lubiprostone, metFORMIN, methocarbamol, mometasone, morphine, naloxone, olmesartan, pantoprazole, rosuvastatin, sennosides-docusate sodium, tamsulosin, and tirzepatide  History  Review  Allergy: Alan Schmidt is allergic to contrast media [iodinated contrast media], iodine, and shellfish allergy. Drug: Alan Schmidt  reports no history of drug use. Alcohol:  reports no history of alcohol use. Tobacco:  reports that he has quit smoking. His smoking use included cigarettes. He has never used smokeless tobacco. Social: Alan Schmidt  reports that he has quit smoking. His smoking use included cigarettes. He has never used smokeless tobacco. He reports that he does not drink alcohol and does not use drugs. Medical:  has a past medical history of Acute postoperative pain (11/24/2016), Anxiety, Chronic hip pain (Right) (12/05/2014), Chronic lumbar pain, Depression, Diabetes mellitus without complication (HCC),  Hyperlipidemia, Hypertension, Kidney stones, and Migraines. Surgical: Alan Schmidt  has a past surgical history that includes Total hip arthroplasty; Tonsillectomy; and right hip surgery. Family: family history includes Cancer in his mother; Diabetes in his father, sister, and sister; Heart disease in his father; Stroke in his father.  Laboratory Chemistry Profile   Renal Lab Results  Component Value Date   BUN 8 06/08/2022   CREATININE 0.94 06/08/2022   BCR NOT APPLICABLE 04/25/2021   GFR 78.85 06/08/2022   GFRAA >60 03/26/2018   GFRNONAA >60 03/26/2018    Hepatic Lab Results  Component Value Date   AST 35 06/08/2022   ALT 35 06/08/2022   ALBUMIN 4.1 06/08/2022   ALKPHOS 73 06/08/2022    Electrolytes Lab Results  Component Value Date   NA 140 06/08/2022   K 3.6 06/08/2022   CL 100 06/08/2022   CALCIUM 9.7 06/08/2022   MG 1.8 03/06/2015    Bone No results found for: "VD25OH", "VD125OH2TOT", "NW2956OZ3", "YQ6578IO9", "25OHVITD1", "25OHVITD2", "25OHVITD3", "TESTOFREE", "TESTOSTERONE"  Inflammation (CRP: Acute Phase) (ESR: Chronic Phase) Lab Results  Component Value Date   CRP 0.5 03/06/2015   ESRSEDRATE 45 (H) 12/14/2016         Note: Above Lab results reviewed.  Recent Imaging Review  MR LUMBAR SPINE WO CONTRAST CLINICAL DATA:  Low back pain. Radiating into right leg for 6 weeks.  EXAM: MRI LUMBAR SPINE WITHOUT CONTRAST  TECHNIQUE: Multiplanar, multisequence MR imaging of the lumbar spine was performed. No intravenous contrast was administered.  COMPARISON:  Lumbar spine radiographs 11/23/2019.  FINDINGS: Segmentation: Conventional numbering is assumed with 5 non-rib-bearing, lumbar type vertebral bodies.  Alignment:  Trace anterolisthesis of L4 on L5 and L5 on S1.  Vertebrae: Normal vertebral body heights. Modic type 1 degenerative endplate marrow signal changes at L2-3.  Conus medullaris and cauda equina: Conus extends to the L1 level. Conus and  cauda equina appear normal.  Paraspinal and other soft tissues: Fatty atrophy of the paraspinal muscles with near-complete fatty replacement of the right psoas muscle.  Disc levels:  T12-L1: Small disc bulge and mild bilateral facet arthropathy. No spinal canal stenosis or neural foraminal narrowing.  L1-L2: Small disc bulge and mild bilateral facet arthropathy. No spinal canal stenosis or neural foraminal narrowing.  L2-L3: Disc bulge and facet arthropathy results in moderate spinal canal stenosis. No significant neural foraminal narrowing.  L3-L4: Disc bulge and facet arthropathy results in mild spinal canal stenosis. No significant neural foraminal narrowing.  L4-L5: Disc bulge and severe bilateral facet arthropathy results in moderate-to-severe spinal canal stenosis and mild bilateral neural foraminal narrowing.  L5-S1: Disc bulge with superimposed right subarticular disc protrusion results in compression of the traversing right S1 nerve root in the subarticular zone. Facet arthropathy contributes to mild bilateral neural foraminal narrowing.  IMPRESSION: 1. Multilevel lumbar  spondylosis, worst at L4-5, where there is moderate-to-severe spinal canal stenosis. 2. Moderate spinal canal stenosis at L2-3. 3. At L5-S1, a right subarticular disc protrusion compresses the traversing right S1 nerve root. 4. Mild spinal canal stenosis at L3-4.  Electronically Signed   By: Orvan Falconer M.D.   On: 06/02/2022 16:01 Note: Reviewed        Physical Exam  General appearance: Well nourished, well developed, and well hydrated. In no apparent acute distress Mental status: Alert, oriented x 3 (person, place, & time)       Respiratory: No evidence of acute respiratory distress Eyes: PERLA Vitals: There were no vitals taken for this visit. BMI: Estimated body mass index is 34.58 kg/m as calculated from the following:   Height as of 08/10/22: 6' (1.829 m).   Weight as of 08/10/22: 255  lb (115.7 kg). Ideal: Patient weight not recorded  Assessment   Diagnosis Status  1. Chronic pain syndrome   2. Chronic low back pain (1ry area of Pain) (Bilateral) w/o sciatica   3. Chronic lower extremity pain (2ry area of Pain) (Bilateral) (R>L)   4. DDD (degenerative disc disease), cervical   5. DDD (degenerative disc disease), lumbar   6. Chronic neck pain (midline over the C7 spinous processes) (L>R)   7. Lumbar facet syndrome (Bilateral) (R>L)   8. Chronic knee pain (Bilateral)   9. 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 Pharmacologic therapy   11. Chronic use of opiate for therapeutic purpose   12. Encounter for medication management   13. Encounter for chronic pain management    Controlled Controlled Controlled   Updated Problems: No problems updated.  Plan of Care  Problem-specific:  No problem-specific Assessment & Plan notes found for this encounter.  Alan Schmidt has a current medication list which includes the following long-term medication(s): bupropion, carvedilol, escitalopram, gabapentin, hydrochlorothiazide, insulin lispro, loratadine, lubiprostone, metformin, mometasone, morphine, olmesartan, pantoprazole, rosuvastatin, and benefiber.  Pharmacotherapy (Medications Ordered): No orders of the defined types were placed in this encounter.  Orders:  No orders of the defined types were placed in this encounter.  Follow-up plan:   No follow-ups on file.      Interventional Therapies  Risk Factors  Considerations:   Allergy: CONTRAST, IODINE, & shellfish  VASOVAGAL RXN BNZ Tx   MO (L-FCT 5" needle to the hub)      NO MORE RFA - intraoperative noncompliance, unnecessarily increasing radiation exposure NOTE: NO MORE RFA procedures. Difficulty following intra-procedural instructions and commands, resulting in unnecessarily long exposure of staff to radiation.    Planned  Pending:   Therapeutic bilateral (L4-5,  L5-S1) lumbar facet (L3-S1) MBB #4    Under consideration:   Diagnostic Left GONB    Completed:   Therapeutic bilateral lumbar facet (L2-S1) MBB x3 (12/16/21) (100/100/80/80)  Therapeutic left lumbar facet (L2-S1 MB) RFA x2 (05/09/2019) (90/90/77/>75)  Therapeutic right lumbar facet (L2-S1 MB) RFA x6 (12/14/2019) (100/100/70/70) (No intra-op compliance)  Diagnostic/therapeutic right small joint inj. (thumb) x1 (03/15/2018) (N/A)  Diagnostic/therapeutic left cervical ESI x1 (12/18/2015) (90/70/50/50)    Completed by other providers:   None at this time   Therapeutic  Palliative (PRN) options:   Palliative lumbar facet MBB  Therapeutic cervical ESI    Pharmacotherapy  Nonopioids transferred 12/14/2019: Gabapentin, Amitiza, Benefiber, Mobic       Recent Visits Date Type Provider Dept  08/10/22 Office Visit Delano Metz, MD [facility]  07/08/22 Office  Visit Delano Metz, MD [facility]  Showing recent visits within past 90 days and meeting all other requirements Future Appointments Date Type Provider Dept  09/09/22 Appointment Delano Metz, MD [facility]  Showing future appointments within next 90 days and meeting all other requirements  I discussed the assessment and treatment plan with the patient. The patient was provided an opportunity to ask questions and all were answered. The patient agreed with the plan and demonstrated an understanding of the instructions.  Patient advised to call back or seek an in-person evaluation if the symptoms or condition worsens.  Duration of encounter: *** minutes.  Total time on encounter, as per AMA guidelines included both the face-to-face and non-face-to-face time personally spent by the physician and/or other qualified health care professional(s) on the day of the encounter (includes time in activities that require the physician or other qualified health care professional and does not include  time in activities normally performed by clinical staff). Physician's time may include the following activities when performed: Preparing to see the patient (e.g., pre-charting review of records, searching for previously ordered imaging, lab work, and nerve conduction tests) Review of prior analgesic pharmacotherapies. Reviewing PMP Interpreting ordered tests (e.g., lab work, imaging, nerve conduction tests) Performing post-procedure evaluations, including interpretation of diagnostic procedures Obtaining and/or reviewing separately obtained history Performing a medically appropriate examination and/or evaluation Counseling and educating the patient/family/caregiver Ordering medications, tests, or procedures Referring and communicating with other health care professionals (when not separately reported) Documenting clinical information in the electronic or other health record Independently interpreting results (not separately reported) and communicating results to the patient/ family/caregiver Care coordination (not separately reported)  Note by: Oswaldo Done, MD Date: 09/09/2022; Time: 7:51 AM

## 2022-09-09 ENCOUNTER — Ambulatory Visit: Payer: 59

## 2022-09-09 ENCOUNTER — Ambulatory Visit (HOSPITAL_BASED_OUTPATIENT_CLINIC_OR_DEPARTMENT_OTHER): Payer: 59 | Admitting: Pain Medicine

## 2022-09-09 DIAGNOSIS — Z91199 Patient's noncompliance with other medical treatment and regimen due to unspecified reason: Secondary | ICD-10-CM

## 2022-09-09 DIAGNOSIS — M47816 Spondylosis without myelopathy or radiculopathy, lumbar region: Secondary | ICD-10-CM

## 2022-09-09 DIAGNOSIS — Z79899 Other long term (current) drug therapy: Secondary | ICD-10-CM

## 2022-09-09 DIAGNOSIS — G8929 Other chronic pain: Secondary | ICD-10-CM

## 2022-09-09 DIAGNOSIS — M5030 Other cervical disc degeneration, unspecified cervical region: Secondary | ICD-10-CM

## 2022-09-09 DIAGNOSIS — Z79891 Long term (current) use of opiate analgesic: Secondary | ICD-10-CM

## 2022-09-09 DIAGNOSIS — M545 Low back pain, unspecified: Secondary | ICD-10-CM

## 2022-09-09 DIAGNOSIS — M5136 Other intervertebral disc degeneration, lumbar region: Secondary | ICD-10-CM

## 2022-09-09 DIAGNOSIS — G894 Chronic pain syndrome: Secondary | ICD-10-CM

## 2022-09-11 ENCOUNTER — Ambulatory Visit: Payer: 59

## 2022-09-11 DIAGNOSIS — M6281 Muscle weakness (generalized): Secondary | ICD-10-CM | POA: Diagnosis not present

## 2022-09-11 DIAGNOSIS — R2681 Unsteadiness on feet: Secondary | ICD-10-CM | POA: Diagnosis not present

## 2022-09-14 ENCOUNTER — Ambulatory Visit: Payer: 59

## 2022-09-16 ENCOUNTER — Ambulatory Visit: Payer: 59

## 2022-09-18 DIAGNOSIS — M6281 Muscle weakness (generalized): Secondary | ICD-10-CM | POA: Diagnosis not present

## 2022-09-18 DIAGNOSIS — R2681 Unsteadiness on feet: Secondary | ICD-10-CM | POA: Diagnosis not present

## 2022-09-22 ENCOUNTER — Ambulatory Visit (INDEPENDENT_AMBULATORY_CARE_PROVIDER_SITE_OTHER): Payer: 59 | Admitting: Family Medicine

## 2022-09-22 ENCOUNTER — Ambulatory Visit: Payer: 59

## 2022-09-22 ENCOUNTER — Encounter: Payer: Self-pay | Admitting: Family Medicine

## 2022-09-22 VITALS — BP 126/82 | HR 77 | Temp 98.1°F | Ht 72.0 in | Wt 264.6 lb

## 2022-09-22 DIAGNOSIS — Z7189 Other specified counseling: Secondary | ICD-10-CM | POA: Diagnosis not present

## 2022-09-22 DIAGNOSIS — R600 Localized edema: Secondary | ICD-10-CM | POA: Diagnosis not present

## 2022-09-22 DIAGNOSIS — I10 Essential (primary) hypertension: Secondary | ICD-10-CM | POA: Diagnosis not present

## 2022-09-22 DIAGNOSIS — J3089 Other allergic rhinitis: Secondary | ICD-10-CM

## 2022-09-22 LAB — CBC
HCT: 42.4 % (ref 39.0–52.0)
Hemoglobin: 13.4 g/dL (ref 13.0–17.0)
MCHC: 31.7 g/dL (ref 30.0–36.0)
MCV: 92.7 fl (ref 78.0–100.0)
Platelets: 212 10*3/uL (ref 150.0–400.0)
RBC: 4.58 Mil/uL (ref 4.22–5.81)
RDW: 14.7 % (ref 11.5–15.5)
WBC: 7.5 10*3/uL (ref 4.0–10.5)

## 2022-09-22 LAB — COMPREHENSIVE METABOLIC PANEL
ALT: 20 U/L (ref 0–53)
AST: 21 U/L (ref 0–37)
Albumin: 3.9 g/dL (ref 3.5–5.2)
Alkaline Phosphatase: 83 U/L (ref 39–117)
BUN: 8 mg/dL (ref 6–23)
CO2: 32 mEq/L (ref 19–32)
Calcium: 9.2 mg/dL (ref 8.4–10.5)
Chloride: 99 mEq/L (ref 96–112)
Creatinine, Ser: 0.85 mg/dL (ref 0.40–1.50)
GFR: 84.35 mL/min (ref >60.00)
Glucose, Bld: 215 mg/dL — ABNORMAL HIGH (ref 70–99)
Potassium: 3.8 mEq/L (ref 3.5–5.1)
Sodium: 140 mEq/L (ref 135–145)
Total Bilirubin: 0.4 mg/dL (ref 0.2–1.2)
Total Protein: 6.7 g/dL (ref 6.0–8.3)

## 2022-09-22 LAB — BRAIN NATRIURETIC PEPTIDE: Pro B Natriuretic peptide (BNP): 23 pg/mL (ref 0.0–100.0)

## 2022-09-22 LAB — TSH: TSH: 3.52 u[IU]/mL (ref 0.35–5.50)

## 2022-09-22 NOTE — Assessment & Plan Note (Signed)
Chronic issue.  He can continue Claritin 10 mg daily.  He will try stopping his humidifier to see if that helps with his congestion.

## 2022-09-22 NOTE — Assessment & Plan Note (Signed)
Chronic issue.  Adequately controlled for age.  He will continue carvedilol 25 mg twice daily, HCTZ 25 mg daily, and olmesartan 40 mg daily.

## 2022-09-22 NOTE — Assessment & Plan Note (Signed)
Discussed testosterone lab work is only necessary when patients are having symptoms consistent with testosterone deficiency.  Discussed that if you are not having symptoms then the risk of testosterone replacement therapy outweighs any benefit.  We opted against checking his testosterone.  The patient also wondered about checking a PSA.  Discussed that at his age PSA checks are not recommended

## 2022-09-22 NOTE — Assessment & Plan Note (Signed)
Chronic issue.  Suspect related to venous insufficiency or less likely lymphedema.  We will have lab work today to evaluate for other underlying causes.  Symptoms are not consistent with heart failure.  Discussed elevation of legs and use of compression stockings.

## 2022-09-22 NOTE — Progress Notes (Signed)
[name], MD Phone: [phone]  Alan Schmidt is a 77 y.o. male who presents today for follow-up.  Lower extremity edema: Patient notes this has been going on for about a year.  Notes it is bilateral.  He saw orthopedics a year or so ago and they gave him compression stockings though they did not fit well and were too tight so he had to cut them off.  He notes no shortness of breath, orthopnea, or PND.  Sometimes it improves overnight.  Hypertension: Not checking at home.  Taking carvedilol, HCTZ, and olmesartan.  No chest pain.  Chronic allergy issues: Patient uses a humidifier and wonders if that is contributing to his allergies being worse.  He does take Claritin.  Patient wonders about having his testosterone checked because of his age.  He notes no fatigue.  Social History   Tobacco Use  Smoking Status Former   Types: Cigarettes  Smokeless Tobacco Never    Current Outpatient Medications on File Prior to Visit  Medication Sig Dispense Refill   ACCU-CHEK GUIDE test strip USE AS INSTRUCTED DX CODE: E11.9 100 strip 5   acetaminophen (TYLENOL) 500 MG tablet Take 500 mg by mouth every 6 (six) hours as needed.     ALPRAZolam (XANAX) 1 MG tablet TAKE 1/2 TABLET BY MOUTH 2 TIMES DAILY AS NEEDED FOR ANXIETY. 30 tablet 0   aspirin EC 81 MG tablet Take 81 mg by mouth daily.     buPROPion (WELLBUTRIN XL) 150 MG 24 hr tablet TAKE 1 TABLET (150 MG TOTAL) BY MOUTH DAILY FOR 7 DAYS, THEN 2 TABLETS (300 MG TOTAL) DAILY. 180 tablet 0   carvedilol (COREG) 25 MG tablet Take 1 tablet (25 mg total) by mouth 2 (two) times daily. 180 tablet 3   Continuous Blood Gluc Receiver (FREESTYLE LIBRE 14 DAY READER) DEVI 1 Device by Does not apply route daily. Use to scan to check blood sugar up to 7 times daily; E11.42, 1 Device 1   Continuous Glucose Sensor (FREESTYLE LIBRE 14 DAY SENSOR) MISC 1 Device by Does not apply route every 14 (fourteen) days. E11.42 6 each 3   desoximetasone (TOPICORT) 0.25  % cream APPLY CREAM TO AFFECTED AREA TWO TIMES DAILY, FOR UP TO 7 DAYS, DO NOT APPLY TO FACE   Strength: 0.25 % 30 g 0   empagliflozin (JARDIANCE) 25 MG TABS tablet Take 1 tablet (25 mg total) by mouth daily. 90 tablet 3   EPINEPHrine (EPIPEN 2-PAK) 0.3 mg/0.3 mL IJ SOAJ injection Inject 0.3 mg into the muscle as needed for anaphylaxis. 2 each 0   escitalopram (LEXAPRO) 20 MG tablet Take 1 tablet by mouth daily. 90 tablet 1   gabapentin (NEURONTIN) 250 MG/5ML solution Take 6 ml by mouth at bedtime. 180 mL 1   hydrochlorothiazide (HYDRODIURIL) 25 MG tablet Take 1 tablet by mouth daily. 90 tablet 1   insulin lispro (HUMALOG KWIKPEN) 100 UNIT/ML KwikPen INJECT 5 TO 7 UNITS UNDER THE SKIN THREE TIMES DAILY WITH MEALS 15 mL 1   Insulin Pen Needle (BD PEN NEEDLE NANO U/F) 32G X 4 MM MISC USE EVERY DAY 100 each 4   loratadine (CLARITIN) 10 MG tablet Take 1 tablet by mouth daily. 30 tablet 10   lubiprostone (AMITIZA) 8 MCG capsule TAKE 1 CAPSULE (8 MCG TOTAL) BY MOUTH 2 (TWO) TIMES DAILY WITH A MEAL. SWALLOW THE MEDICATION WHOLE. DO NOT BREAK OR CHEW THE MEDICATION. 180 capsule 1   metFORMIN (GLUCOPHAGE) 1000 MG tablet Take 1  tablet by mouth twice daily with a meal. 180 tablet 1   methocarbamol (ROBAXIN) 500 MG tablet Take 500 mg by mouth 2 (two) times daily.     mometasone (NASONEX) 50 MCG/ACT nasal spray Instill 2 sprays into each nostril once daily. 17 each 0   naloxone (NARCAN) nasal spray 4 mg/0.1 mL Place 1 spray into nostril as needed for up to 365 doses (for opioid-induced respiratory depresssion). In case of emergency (overdose), spray once into each nostril. If no response within 3 minutes, repeat application and call 911. 2 each 0   olmesartan (BENICAR) 40 MG tablet Take 1 tablet by mouth daily. 90 tablet 1   pantoprazole (PROTONIX) 40 MG tablet Take 1 tablet by mouth daily. 90 tablet 1   rosuvastatin (CRESTOR) 40 MG tablet Take 1 tablet by mouth daily. 90 tablet 3   sennosides-docusate sodium  (SENOKOT-S) 8.6-50 MG tablet Take 1 tablet by mouth daily as needed.      tamsulosin (FLOMAX) 0.4 MG CAPS capsule Take 1 capsule by mouth twice daily. 180 capsule 1   tirzepatide (MOUNJARO) 7.5 MG/0.5ML Pen Inject 7.5 mg into the skin once a week. 6 mL 2   TRESIBA FLEXTOUCH 100 UNIT/ML FlexTouch Pen INJECT 18 UNITS INTO THE SKIN DAILY AT 10 PM. 15 mL 3   morphine (MS CONTIN) 30 MG 12 hr tablet Take 1 tablet (30 mg total) by mouth every 12 (twelve) hours. Must last 30 days. Do not break tablet 60 tablet 0   Wheat Dextrin (BENEFIBER) POWD Take 6 g by mouth 3 (three) times daily before meals. (2 tsp = 6 g) 730 g 2   No current facility-administered medications on file prior to visit.     ROS see history of present illness  Objective  Physical Exam Vitals:   09/22/22 0907 09/22/22 0908  BP: 136/84 126/82  Pulse: 77   Temp: 98.1 F (36.7 C)   SpO2: 94%     BP Readings from Last 3 Encounters:  09/22/22 126/82  08/10/22 134/76  07/08/22 130/83   Wt Readings from Last 3 Encounters:  09/22/22 264 lb 9.6 oz (120 kg)  08/10/22 255 lb (115.7 kg)  07/08/22 250 lb (113.4 kg)    Physical Exam Constitutional:      General: He is not in acute distress.    Appearance: He is not diaphoretic.  Cardiovascular:     Rate and Rhythm: Normal rate and regular rhythm.     Heart sounds: Normal heart sounds.  Pulmonary:     Effort: Pulmonary effort is normal.     Breath sounds: Normal breath sounds.  Musculoskeletal:     Comments: 1+ pitting edema up to the knee bilaterally  Skin:    General: Skin is warm and dry.  Neurological:     Mental Status: He is alert.      Assessment/Plan: Please see individual problem list.  Essential hypertension Assessment & Plan: Chronic issue.  Adequately controlled for age.  He will continue carvedilol 25 mg twice daily, HCTZ 25 mg daily, and olmesartan 40 mg daily.   Bilateral lower extremity edema Assessment & Plan: Chronic issue.  Suspect related  to venous insufficiency or less likely lymphedema.  We will have lab work today to evaluate for other underlying causes.  Symptoms are not consistent with heart failure.  Discussed elevation of legs and use of compression stockings.  Orders: -     Comprehensive metabolic panel -     TSH -  CBC -     Brain natriuretic peptide  Allergic rhinitis due to other allergic trigger, unspecified seasonality Assessment & Plan: Chronic issue.  He can continue Claritin 10 mg daily.  He will try stopping his humidifier to see if that helps with his congestion.   Encounter for medication counseling Assessment & Plan: Discussed testosterone lab work is only necessary when patients are having symptoms consistent with testosterone deficiency.  Discussed that if you are not having symptoms then the risk of testosterone replacement therapy outweighs any benefit.  We opted against checking his testosterone.  The patient also wondered about checking a PSA.  Discussed that at his age PSA checks are not recommended      Return in about 3 months (around 12/23/2022).   [name], MD [facility]

## 2022-09-24 ENCOUNTER — Ambulatory Visit: Payer: 59

## 2022-09-25 ENCOUNTER — Telehealth: Payer: Self-pay

## 2022-09-25 ENCOUNTER — Other Ambulatory Visit: Payer: Self-pay | Admitting: Family Medicine

## 2022-09-25 DIAGNOSIS — I872 Venous insufficiency (chronic) (peripheral): Secondary | ICD-10-CM

## 2022-09-25 DIAGNOSIS — Z794 Long term (current) use of insulin: Secondary | ICD-10-CM

## 2022-09-25 NOTE — Telephone Encounter (Signed)
-----   Message from [name] sent at 09/23/2022  9:24 AM EDT ----- Please let the patient know his lab work did not reveal a cause for his swelling.  I suspect his swelling is related to venous insufficiency or lymphedema.  Options at this point include wearing compression stockings, elevating his legs, and/or referral to vascular surgery to get their input.  Given his inability to use the compression stockings previously it may be worthwhile having him see vascular surgery to get their input.  I can place that referral if he is willing.

## 2022-09-25 NOTE — Telephone Encounter (Signed)
Vascular surgery referral placed.

## 2022-09-25 NOTE — Telephone Encounter (Signed)
Patient states he is returning our call.  I read Dr. Bernardo Heater message to patient.  Patient states he is interested in having a referral to vascular surgery.  Patient states he would like to tell Dr. Birdie Sons "thank you".

## 2022-09-25 NOTE — Telephone Encounter (Signed)
Left message to call the office back regarding the result message below.

## 2022-09-28 ENCOUNTER — Ambulatory Visit: Payer: 59

## 2022-09-28 NOTE — Telephone Encounter (Signed)
Patient called office back and note was read.

## 2022-09-28 NOTE — Telephone Encounter (Signed)
Noted  

## 2022-09-29 DIAGNOSIS — M6281 Muscle weakness (generalized): Secondary | ICD-10-CM | POA: Diagnosis not present

## 2022-09-29 DIAGNOSIS — R2681 Unsteadiness on feet: Secondary | ICD-10-CM | POA: Diagnosis not present

## 2022-09-29 NOTE — Telephone Encounter (Signed)
Pt called stated he would like to be referred to Festus Barren in  for the referral

## 2022-09-30 ENCOUNTER — Telehealth: Payer: Self-pay | Admitting: Family Medicine

## 2022-09-30 ENCOUNTER — Ambulatory Visit: Payer: 59

## 2022-09-30 NOTE — Telephone Encounter (Signed)
Dr. Driscilla Grammes name was added to the referral.

## 2022-09-30 NOTE — Addendum Note (Signed)
Addended by: Glori Luis on: 09/30/2022 11:23 AM   Modules accepted: Orders

## 2022-09-30 NOTE — Telephone Encounter (Signed)
Pt called in stating that he feels very awful today. Symptoms are headache, weak, no energy, etc. And would like to know if Alan Schmidt would like prescribed something. I offered appts, but pt stated he was in office last week. Please adivce.

## 2022-10-01 NOTE — Telephone Encounter (Signed)
I offered the Patient an appointment and he states no thank you at this time.Patient would like to see if he can get well on his on. Patient states his symptoms started on 09/30/22. Patient has been sleeping a lot, headache, weakness and no energy. Patient states he was at PT on 09/29/22 and the therapist was telling him how him and his family were so sick last week but did not say what they had.  Patient also said he has lower left abdomen pain x 2-3 days. Patient states the abdomen pain feels about like when he had kidney stones.

## 2022-10-01 NOTE — Progress Notes (Unsigned)
PROVIDER NOTE: Information contained herein reflects review and annotations entered in association with encounter. Interpretation of such information and data should be left to medically-trained personnel. Information provided to patient can be located elsewhere in the medical record under "Patient Instructions". Document created using STT-dictation technology, any transcriptional errors that may result from process are unintentional.    Patient: Alan Schmidt  Service Category: E/M  Provider: Oswaldo Done, MD  DOB: [birth year]  DOS: 10/07/2022  Referring Provider: Glori Luis, MD  MRN: [id number]  Specialty: Interventional Pain Management  PCP: Glori Luis, MD  Type: Established Patient  Setting: Ambulatory outpatient    Location: Office  Delivery: Face-to-face     HPI  Alan Schmidt, a 77 y.o. year old male, is here today because of his Chronic pain syndrome [G89.4]. Alan Schmidt primary complain today is No chief complaint on file.  Pertinent problems: Alan Schmidt has Chronic low back pain (Bilateral) (R>L) w/ sciatica (Bilateral); Lumbar facet syndrome (Bilateral) (R>L); Lumbar spondylosis; Diabetic peripheral neuropathy (HCC); Chronic neck pain (midline over the C7 spinous processes) (L>R); Neurogenic pain; Neuropathic pain; Chronic lower extremity pain (2ry area of Pain) (Bilateral) (R>L); 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) (Right L5 dermatome); History of total hip replacement (Right); Chronic hip pain (Right); Abnormal nerve conduction studies (severe bilateral lower extremity polyneuropathy); Chronic shoulder pain (Bilateral) (R>L); Occipital neuralgia (Left); Cervicogenic headache (Left); Chronic shoulder radicular pain (Left); Chronic cervical radicular pain (Left); Chronic pain syndrome; Lumbar facet joint osteoarthritis (Bilateral); Chronic headache; Chronic tension-type headache, intractable; Coccygodynia; DDD (degenerative disc disease), lumbar;  Chronic musculoskeletal pain; Chronic knee pain (Bilateral); Thumb pain (Right); 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 Pain Assessment: Severity of   is reported as a  /10. Location:    / . Onset:  . Quality:  . Timing:  . Modifying factor(s):  Marland Kitchen Vitals:  vitals were not taken for this visit.  BMI: Estimated body mass index is 35.89 kg/m as calculated from the following:   Height as of 09/22/22: 6' (1.829 m).   Weight as of 09/22/22: 264 lb 9.6 oz (120 kg). Last encounter: 09/09/2022. Last procedure: 04/14/2022.  Reason for encounter: medication management. ***  RTCB: 01/05/2023   Pharmacotherapy Assessment  Analgesic: Morphine ER (MS Contin) 30 mg, 1 tab p.o. twice daily (60 mg/day).  (Previously on oxycodone IR 10 mg every 6 hours (40 mg/day)) MME/day: 60 mg/day.   Monitoring: [place] PMP: PDMP reviewed during this encounter.       Pharmacotherapy: No side-effects or adverse reactions reported. Compliance: No problems identified. Effectiveness: Clinically acceptable.  No notes on file  No results found for: "CBDTHCR" No results found for: "D8THCCBX" No results found for: "D9THCCBX"  UDS:  Summary  Date Value Ref Range Status  07/08/2022 Note  Final    Comment:    ==================================================================== [employer] 13 (MW) ==================================================================== Test                             Result       Flag       Units  Drug Present and Declared for Prescription  Verification   Alprazolam                      58           EXPECTED   ng/mg creat   Alpha-hydroxyalprazolam        142          EXPECTED   ng/mg creat    Source of alprazolam is a scheduled prescription medication. Alpha-    hydroxyalprazolam is an expected metabolite of alprazolam.    Morphine                       15430        EXPECTED   ng/mg creat   Normorphine                    570          EXPECTED   ng/mg creat    Potential sources of large amounts of morphine in the absence of    codeine include administration of morphine or use of heroin.     Normorphine is an expected metabolite of morphine.    Hydromorphone                  362          EXPECTED   ng/mg creat    Hydromorphone may be present as a metabolite of morphine;    concentrations of hydromorphone rarely exceed 5% of the morphine    concentration when this is the source of hydromorphone.  ==================================================================== Test                      Result    Flag   Units      Ref Range   Creatinine              50               mg/dL      >=16 ==================================================================== Declared Medications:  The flagging and interpretation on this report are based on the  following declared medications.  Unexpected results may arise from  inaccuracies in the declared medications.   **Note: The testing scope of this panel includes these medications:   Alprazolam  Morphine (MS Contin)   **Note: The testing scope of this panel does not include the  following reported medications:   Acetaminophen (Tylenol)  Aspirin  Bupropion (Wellbutrin XL)  Carvedilol (Coreg)  Desoximetasone (Topicort)  Empagliflozin (Jardiance)  Epinephrine (EpiPen)  Escitalopram (Lexapro)  Gabapentin (Neurontin)  Hydrochlorothiazide  Insulin Evaristo Bury)  Loratadine (Claritin)  Lubiprostone (Amitiza)  Metformin  Methocarbamol  Mometasone (Nasonex)  Naloxone (Narcan)  Olmesartan (Benicar)  Pantoprazole  (Protonix)  Rosuvastatin (Crestor)  Supplement (Fiber)  Tamsulosin (Flomax)  Tirzepatide (Mounjaro) ==================================================================== For clinical consultation, please call [phone]. ====================================================================       ROS  Constitutional: Denies any fever or chills Gastrointestinal: No reported hemesis, hematochezia, vomiting, or acute GI distress Musculoskeletal: Denies any acute onset joint swelling, redness, loss of ROM, or weakness Neurological: No reported episodes of acute onset apraxia, aphasia, dysarthria, agnosia, amnesia, paralysis, loss of coordination, or loss of consciousness  Medication Review  ALPRAZolam, Benefiber, EPINEPHrine, FreeStyle Libre 14 Day Reader, FreeStyle Libre 14 Day Sensor, Insulin Pen Needle, acetaminophen, aspirin EC, buPROPion, carvedilol, desoximetasone, empagliflozin, escitalopram, gabapentin, glucose blood, hydrochlorothiazide, insulin degludec, insulin lispro, loratadine, lubiprostone, metFORMIN, methocarbamol, mometasone, morphine, naloxone, olmesartan, pantoprazole, rosuvastatin, sennosides-docusate sodium, tamsulosin, and  tirzepatide  History Review  Allergy: Alan Schmidt is allergic to contrast media [iodinated contrast media], iodine, and shellfish allergy. Drug: Alan Schmidt  reports no history of drug use. Alcohol:  reports no history of alcohol use. Tobacco:  reports that he has quit smoking. His smoking use included cigarettes. He has never used smokeless tobacco. Social: Alan Schmidt  reports that he has quit smoking. His smoking use included cigarettes. He has never used smokeless tobacco. He reports that he does not drink alcohol and does not use drugs. Medical:  has a past medical history of Acute postoperative pain (11/24/2016), Anxiety, Chronic hip pain (Right) (12/05/2014), Chronic lumbar pain, Depression, Diabetes mellitus without complication (HCC),  Hyperlipidemia, Hypertension, Kidney stones, and Migraines. Surgical: Alan Schmidt  has a past surgical history that includes Total hip arthroplasty; Tonsillectomy; and right hip surgery. Family: family history includes Cancer in his mother; Diabetes in his father, sister, and sister; Heart disease in his father; Stroke in his father.  Laboratory Chemistry Profile   Renal Lab Results  Component Value Date   BUN 8 09/22/2022   CREATININE 0.85 09/22/2022   BCR NOT APPLICABLE 04/25/2021   GFR 84.35 09/22/2022   GFRAA >60 03/26/2018   GFRNONAA >60 03/26/2018    Hepatic Lab Results  Component Value Date   AST 21 09/22/2022   ALT 20 09/22/2022   ALBUMIN 3.9 09/22/2022   ALKPHOS 83 09/22/2022    Electrolytes Lab Results  Component Value Date   NA 140 09/22/2022   K 3.8 09/22/2022   CL 99 09/22/2022   CALCIUM 9.2 09/22/2022   MG 1.8 03/06/2015    Bone No results found for: "VD25OH", "VD125OH2TOT", "QI3474QV9", "DG3875IE3", "25OHVITD1", "25OHVITD2", "25OHVITD3", "TESTOFREE", "TESTOSTERONE"  Inflammation (CRP: Acute Phase) (ESR: Chronic Phase) Lab Results  Component Value Date   CRP 0.5 03/06/2015   ESRSEDRATE 45 (H) 12/14/2016         Note: Above Lab results reviewed.  Recent Imaging Review  MR LUMBAR SPINE WO CONTRAST CLINICAL DATA:  Low back pain. Radiating into right leg for 6 weeks.  EXAM: MRI LUMBAR SPINE WITHOUT CONTRAST  TECHNIQUE: Multiplanar, multisequence MR imaging of the lumbar spine was performed. No intravenous contrast was administered.  COMPARISON:  Lumbar spine radiographs 11/23/2019.  FINDINGS: Segmentation: Conventional numbering is assumed with 5 non-rib-bearing, lumbar type vertebral bodies.  Alignment:  Trace anterolisthesis of L4 on L5 and L5 on S1.  Vertebrae: Normal vertebral body heights. Modic type 1 degenerative endplate marrow signal changes at L2-3.  Conus medullaris and cauda equina: Conus extends to the L1 level. Conus and cauda  equina appear normal.  Paraspinal and other soft tissues: Fatty atrophy of the paraspinal muscles with near-complete fatty replacement of the right psoas muscle.  Disc levels:  T12-L1: Small disc bulge and mild bilateral facet arthropathy. No spinal canal stenosis or neural foraminal narrowing.  L1-L2: Small disc bulge and mild bilateral facet arthropathy. No spinal canal stenosis or neural foraminal narrowing.  L2-L3: Disc bulge and facet arthropathy results in moderate spinal canal stenosis. No significant neural foraminal narrowing.  L3-L4: Disc bulge and facet arthropathy results in mild spinal canal stenosis. No significant neural foraminal narrowing.  L4-L5: Disc bulge and severe bilateral facet arthropathy results in moderate-to-severe spinal canal stenosis and mild bilateral neural foraminal narrowing.  L5-S1: Disc bulge with superimposed right subarticular disc protrusion results in compression of the traversing right S1 nerve root in the subarticular zone. Facet arthropathy contributes to mild bilateral neural foraminal narrowing.  IMPRESSION:  1. Multilevel lumbar spondylosis, worst at L4-5, where there is moderate-to-severe spinal canal stenosis. 2. Moderate spinal canal stenosis at L2-3. 3. At L5-S1, a right subarticular disc protrusion compresses the traversing right S1 nerve root. 4. Mild spinal canal stenosis at L3-4.  Electronically Signed   By: Orvan Falconer M.D.   On: 06/02/2022 16:01 Note: Reviewed        Physical Exam  General appearance: Well nourished, well developed, and well hydrated. In no apparent acute distress Mental status: Alert, oriented x 3 (person, place, & time)       Respiratory: No evidence of acute respiratory distress Eyes: PERLA Vitals: There were no vitals taken for this visit. BMI: Estimated body mass index is 35.89 kg/m as calculated from the following:   Height as of 09/22/22: 6' (1.829 m).   Weight as of 09/22/22: 264 lb  9.6 oz (120 kg). Ideal: Ideal body weight: 77.6 kg (171 lb 1.2 oz) Adjusted ideal body weight: 94.6 kg (208 lb 7.8 oz)  Assessment   Diagnosis Status  1. Chronic pain syndrome   2. Chronic low back pain (1ry area of Pain) (Bilateral) w/o sciatica   3. Chronic lower extremity pain (2ry area of Pain) (Bilateral) (R>L)   4. DDD (degenerative disc disease), cervical   5. DDD (degenerative disc disease), lumbar   6. Chronic neck pain (midline over the C7 spinous processes) (L>R)   7. Lumbar facet syndrome (Bilateral) (R>L)   8. Chronic knee pain (Bilateral)   9. 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 Pharmacologic therapy   11. Chronic use of opiate for therapeutic purpose   12. Encounter for medication management   13. Encounter for chronic pain management    Controlled Controlled Controlled   Updated Problems: No problems updated.  Plan of Care  Problem-specific:  No problem-specific Assessment & Plan notes found for this encounter.  Alan Schmidt has a current medication list which includes the following long-term medication(s): bupropion, carvedilol, escitalopram, gabapentin, hydrochlorothiazide, insulin lispro, loratadine, lubiprostone, metformin, mometasone, morphine, olmesartan, pantoprazole, rosuvastatin, and benefiber.  Pharmacotherapy (Medications Ordered): No orders of the defined types were placed in this encounter.  Orders:  No orders of the defined types were placed in this encounter.  Follow-up plan:   No follow-ups on file.      Interventional Therapies  Risk Factors  Considerations:   Allergy: CONTRAST, IODINE, & shellfish  VASOVAGAL RXN BNZ Tx   MO (L-FCT 5" needle to the hub)      NO MORE RFA - intraoperative noncompliance, unnecessarily increasing radiation exposure NOTE: NO MORE RFA procedures. Difficulty following intra-procedural instructions and commands, resulting in unnecessarily long exposure of staff to  radiation.    Planned  Pending:   Therapeutic bilateral (L4-5, L5-S1) lumbar facet (L3-S1) MBB #4    Under consideration:   Diagnostic Left GONB    Completed:   Therapeutic bilateral lumbar facet (L2-S1) MBB x3 (12/16/21) (100/100/80/80)  Therapeutic left lumbar facet (L2-S1 MB) RFA x2 (05/09/2019) (90/90/77/>75)  Therapeutic right lumbar facet (L2-S1 MB) RFA x6 (12/14/2019) (100/100/70/70) (No intra-op compliance)  Diagnostic/therapeutic right small joint inj. (thumb) x1 (03/15/2018) (N/A)  Diagnostic/therapeutic left cervical ESI x1 (12/18/2015) (90/70/50/50)    Completed by other providers:   None at this time   Therapeutic  Palliative (PRN) options:   Palliative lumbar facet MBB  Therapeutic cervical ESI    Pharmacotherapy  Nonopioids transferred 12/14/2019: Gabapentin, Amitiza, Benefiber, Mobic  Recent Visits Date Type Provider Dept  08/10/22 Office Visit Delano Metz, MD [facility]  07/08/22 Office Visit Delano Metz, MD [facility]  Showing recent visits within past 90 days and meeting all other requirements Future Appointments Date Type Provider Dept  10/07/22 Appointment Delano Metz, MD [facility]  Showing future appointments within next 90 days and meeting all other requirements  I discussed the assessment and treatment plan with the patient. The patient was provided an opportunity to ask questions and all were answered. The patient agreed with the plan and demonstrated an understanding of the instructions.  Patient advised to call back or seek an in-person evaluation if the symptoms or condition worsens.  Duration of encounter: *** minutes.  Total time on encounter, as per AMA guidelines included both the face-to-face and non-face-to-face time personally spent by the physician and/or other qualified health care professional(s) on the day of the encounter (includes time in activities that require the  physician or other qualified health care professional and does not include time in activities normally performed by clinical staff). Physician's time may include the following activities when performed: Preparing to see the patient (e.g., pre-charting review of records, searching for previously ordered imaging, lab work, and nerve conduction tests) Review of prior analgesic pharmacotherapies. Reviewing PMP Interpreting ordered tests (e.g., lab work, imaging, nerve conduction tests) Performing post-procedure evaluations, including interpretation of diagnostic procedures Obtaining and/or reviewing separately obtained history Performing a medically appropriate examination and/or evaluation Counseling and educating the patient/family/caregiver Ordering medications, tests, or procedures Referring and communicating with other health care professionals (when not separately reported) Documenting clinical information in the electronic or other health record Independently interpreting results (not separately reported) and communicating results to the patient/ family/caregiver Care coordination (not separately reported)  Note by: Oswaldo Done, MD Date: 10/07/2022; Time: 2:15 PM

## 2022-10-01 NOTE — Telephone Encounter (Signed)
Patient agrees to get a covid test and test at home. Patient did state if he gets worse he will call and get an appointment.

## 2022-10-01 NOTE — Telephone Encounter (Signed)
Patient needs to be evaluated.  He should check for COVID if he has a home COVID test.

## 2022-10-02 ENCOUNTER — Other Ambulatory Visit: Payer: Self-pay | Admitting: Family Medicine

## 2022-10-02 DIAGNOSIS — E1142 Type 2 diabetes mellitus with diabetic polyneuropathy: Secondary | ICD-10-CM

## 2022-10-02 DIAGNOSIS — M6281 Muscle weakness (generalized): Secondary | ICD-10-CM | POA: Diagnosis not present

## 2022-10-02 DIAGNOSIS — R2681 Unsteadiness on feet: Secondary | ICD-10-CM | POA: Diagnosis not present

## 2022-10-02 MED ORDER — MOMETASONE FUROATE 50 MCG/ACT NA SUSP: NASAL | 2 refills | Status: DC

## 2022-10-02 MED ORDER — EMPAGLIFLOZIN 25 MG PO TABS
25.0000 mg | ORAL_TABLET | Freq: Every day | ORAL | 0 refills | Status: DC
Start: 2022-10-02 — End: 2023-03-22

## 2022-10-04 NOTE — Patient Instructions (Signed)
 ____________________________________________________________________________________________  Opioid Pain Medication Update  To: All patients taking opioid pain medications. (I.e.: hydrocodone, hydromorphone, oxycodone, oxymorphone, morphine, codeine, methadone, tapentadol, tramadol, buprenorphine, fentanyl, etc.)  Re: Updated review of side effects and adverse reactions of opioid analgesics, as well as new information about long term effects of this class of medications.  Direct risks of long-term opioid therapy are not limited to opioid addiction and overdose. Potential medical risks include serious fractures, breathing problems during sleep, hyperalgesia, immunosuppression, chronic constipation, bowel obstruction, myocardial infarction, and tooth decay secondary to xerostomia.  Unpredictable adverse effects that can occur even if you take your medication correctly: Cognitive impairment, respiratory depression, and death. Most people think that if they take their medication "correctly", and "as instructed", that they will be safe. Nothing could be farther from the truth. In reality, a significant amount of recorded deaths associated with the use of opioids has occurred in individuals that had taken the medication for a long time, and were taking their medication correctly. The following are examples of how this can happen: Patient taking his/her medication for a long time, as instructed, without any side effects, is given a certain antibiotic or another unrelated medication, which in turn triggers a "Drug-to-drug interaction" leading to disorientation, cognitive impairment, impaired reflexes, respiratory depression or an untoward event leading to serious bodily harm or injury, including death.  Patient taking his/her medication for a long time, as instructed, without any side effects, develops an acute impairment of liver and/or kidney function. This will lead to a rapid inability of the body to  breakdown and eliminate their pain medication, which will result in effects similar to an "overdose", but with the same medicine and dose that they had always taken. This again may lead to disorientation, cognitive impairment, impaired reflexes, respiratory depression or an untoward event leading to serious bodily harm or injury, including death.  A similar problem will occur with patients as they grow older and their liver and kidney function begins to decrease as part of the aging process.  Background information: Historically, the original case for using long-term opioid therapy to treat chronic noncancer pain was based on safety assumptions that subsequent experience has called into question. In 1996, the American Pain Society and the American Academy of Pain Medicine issued a consensus statement supporting long-term opioid therapy. This statement acknowledged the dangers of opioid prescribing but concluded that the risk for addiction was low; respiratory depression induced by opioids was short-lived, occurred mainly in opioid-naive patients, and was antagonized by pain; tolerance was not a common problem; and efforts to control diversion should not constrain opioid prescribing. This has now proven to be wrong. Experience regarding the risks for opioid addiction, misuse, and overdose in community practice has failed to support these assumptions.  According to the Centers for Disease Control and Prevention, fatal overdoses involving opioid analgesics have increased sharply over the past decade. Currently, more than 96,700 people die from drug overdoses every year. Opioids are a factor in 7 out of every 10 overdose deaths. Deaths from drug overdose have surpassed motor vehicle accidents as the leading cause of death for individuals between the ages of 80 and 61.  Clinical data suggest that neuroendocrine dysfunction may be very common in both men and women, potentially causing hypogonadism, erectile  dysfunction, infertility, decreased libido, osteoporosis, and depression. Recent studies linked higher opioid dose to increased opioid-related mortality. Controlled observational studies reported that long-term opioid therapy may be associated with increased risk for cardiovascular events. Subsequent meta-analysis concluded  that the safety of long-term opioid therapy in elderly patients has not been proven.   Side Effects and adverse reactions: Common side effects: Drowsiness (sedation). Dizziness. Nausea and vomiting. Constipation. Physical dependence -- Dependence often manifests with withdrawal symptoms when opioids are discontinued or decreased. Tolerance -- As you take repeated doses of opioids, you require increased medication to experience the same effect of pain relief. Respiratory depression -- This can occur in healthy people, especially with higher doses. However, people with COPD, asthma or other lung conditions may be even more susceptible to fatal respiratory impairment.  Uncommon side effects: An increased sensitivity to feeling pain and extreme response to pain (hyperalgesia). Chronic use of opioids can lead to this. Delayed gastric emptying (the process by which the contents of your stomach are moved into your small intestine). Muscle rigidity. Immune system and hormonal dysfunction. Quick, involuntary muscle jerks (myoclonus). Arrhythmia. Itchy skin (pruritus). Dry mouth (xerostomia).  Long-term side effects: Chronic constipation. Sleep-disordered breathing (SDB). Increased risk of bone fractures. Hypothalamic-pituitary-adrenal dysregulation. Increased risk of overdose.  RISKS: Respiratory depression and death: Opioids increase the risk of respiratory depression and death.  Drug-to-drug interactions: Opioids are relatively contraindicated in combination with benzodiazepines, sleep inducers, and other central nervous system depressants. Other classes of medications  (i.e.: certain antibiotics and even over-the-counter medications) may also trigger or induce respiratory depression in some patients.  Medical conditions: Patients with pre-existing respiratory problems are at higher risk of respiratory failure and/or depression when in combination with opioid analgesics. Opioids are relatively contraindicated in some medical conditions such as central sleep apnea.   Fractures and Falls:  Opioids increase the risk and incidence of falls. This is of particular importance in elderly patients.  Endocrine System:  Long-term administration is associated with endocrine abnormalities (endocrinopathies). (Also known as Opioid-induced Endocrinopathy) Influences on both the hypothalamic-pituitary-adrenal axis?and the hypothalamic-pituitary-gonadal axis have been demonstrated with consequent hypogonadism and adrenal insufficiency in both sexes. Hypogonadism and decreased levels of dehydroepiandrosterone sulfate have been reported in men and women. Endocrine effects include: Amenorrhoea in women (abnormal absence of menstruation) Reduced libido in both sexes Decreased sexual function Erectile dysfunction in men Hypogonadisms (decreased testicular function with shrinkage of testicles) Infertility Depression and fatigue Loss of muscle mass Anxiety Depression Immune suppression Hyperalgesia Weight gain Anemia Osteoporosis Patients (particularly women of childbearing age) should avoid opioids. There is insufficient evidence to recommend routine monitoring of asymptomatic patients taking opioids in the long-term for hormonal deficiencies.  Immune System: Human studies have demonstrated that opioids have an immunomodulating effect. These effects are mediated via opioid receptors both on immune effector cells and in the central nervous system. Opioids have been demonstrated to have adverse effects on antimicrobial response and anti-tumour surveillance. Buprenorphine has  been demonstrated to have no impact on immune function.  Opioid Induced Hyperalgesia: Human studies have demonstrated that prolonged use of opioids can lead to a state of abnormal pain sensitivity, sometimes called opioid induced hyperalgesia (OIH). Opioid induced hyperalgesia is not usually seen in the absence of tolerance to opioid analgesia. Clinically, hyperalgesia may be diagnosed if the patient on long-term opioid therapy presents with increased pain. This might be qualitatively and anatomically distinct from pain related to disease progression or to breakthrough pain resulting from development of opioid tolerance. Pain associated with hyperalgesia tends to be more diffuse than the pre-existing pain and less defined in quality. Management of opioid induced hyperalgesia requires opioid dose reduction.  Cancer: Chronic opioid therapy has been associated with an increased risk of cancer  among noncancer patients with chronic pain. This association was more evident in chronic strong opioid users. Chronic opioid consumption causes significant pathological changes in the small intestine and colon. Epidemiological studies have found that there is a link between opium dependence and initiation of gastrointestinal cancers. Cancer is the second leading cause of death after cardiovascular disease. Chronic use of opioids can cause multiple conditions such as GERD, immunosuppression and renal damage as well as carcinogenic effects, which are associated with the incidence of cancers.   Mortality: Long-term opioid use has been associated with increased mortality among patients with chronic non-cancer pain (CNCP).  Prescription of long-acting opioids for chronic noncancer pain was associated with a significantly increased risk of all-cause mortality, including deaths from causes other than overdose.  Reference: Von Korff M, Kolodny A, Deyo RA, Chou R. Long-term opioid therapy reconsidered. Ann Intern Med. 2011  Sep 6;155(5):325-8. doi: 10.7326/[id number]. PMID: 64403474; PMCID: [id number]. Randon Goldsmith, Hayward RA, Dunn KM, [place] KP. Risk of adverse events in patients prescribed long-term opioids: A cohort study in the [place] [facility] Research Datalink. Eur J Pain. 2019 May;23(5):908-922. doi: 10.1002/ejp.1357. Epub 2019 Jan 31. PMID: 43329518. Colameco S, Coren JS, Ciervo CA. Continuous opioid treatment for chronic noncancer pain: a time for moderation in prescribing. Postgrad Med. 2009 Jul;121(4):61-6. doi: 10.3810/pgm.2009.07.2032. PMID: 84166063. William Hamburger RN, [place] SD, Blazina I, Cristopher Peru, Bougatsos C, Deyo RA. The effectiveness and risks of long-term opioid therapy for chronic pain: a systematic review for a [employer] of [employer] Pathways to [employer]. Ann Intern Med. 2015 Feb 17;162(4):276-86. doi: 10.7326/M14-2559. PMID: 01601093. [name] [facility], Makuc DM. NCHS Data Brief No. 22. [place]: Centers for Disease Control and Prevention; 2009. Sep, Increase in Fatal Poisonings Involving Opioid Analgesics in the [place], 1999-2006. Song IA, Choi HR, Oh TK. Long-term opioid use and mortality in patients with chronic non-cancer pain: Ten-year follow-up study in [place] from 2010 through 2019. EClinicalMedicine. 2022 Jul 18;51:101558. doi: 10.1016/j.eclinm.2022.235573. PMID: 22025427; PMCID: [id number]. Huser, W., Schubert, T., Vogelmann, T. et al. All-cause mortality in patients with long-term opioid therapy compared with non-opioid analgesics for chronic non-cancer pain: a database study. BMC Med 18, 162 (2020). http://lester.info/ Rashidian H, Karie Kirks, Malekzadeh R, Haghdoost AA. An Ecological Study of the Association between Opiate Use and Incidence of Cancers. Addict Health. 2016 Fall;8(4):252-260. PMID: 51761607; PMCID: [id number].  Our Goal: Our goal is to control your  pain with means other than the use of opioid pain medications.  Our Recommendation: Talk to your physician about coming off of these medications. We can assist you with the tapering down and stopping these medicines. Based on the new information, even if you cannot completely stop the medication, a decrease in the dose may be associated with a lesser risk. Ask for other means of controlling the pain. Decrease or eliminate those factors that significantly contribute to your pain such as smoking, obesity, and a diet heavily tilted towards "inflammatory" nutrients.  Last Updated: 08/10/2022   ____________________________________________________________________________________________     ____________________________________________________________________________________________  National Pain Medication Shortage  The [place] is experiencing worsening drug shortages. These have had a negative widespread effect on patient care and treatment. Not expected to improve any time soon. Predicted to last past 2029.   Drug shortage list (generic names) Oxycodone IR Oxycodone/APAP Oxymorphone IR Hydromorphone Hydrocodone/APAP Morphine  Where is the problem?  Manufacturing and supply level.  Will this shortage affect you?  Only if you  take any of the above pain medications.  How? You may be unable to fill your prescription.  Your pharmacist may offer a "partial fill" of your prescription. (Warning: Do not accept partial fills.) Prescriptions partially filled cannot be transferred to another pharmacy. Read our Medication Rules and Regulation. Depending on how much medicine you are dependent on, you may experience withdrawals when unable to get the medication.  Recommendations: Consider ending your dependence on opioid pain medications. Ask your pain specialist to assist you with the process. Consider switching to a medication currently not in shortage, such as Buprenorphine. Talk to your pain  specialist about this option. Consider decreasing your pain medication requirements by managing tolerance thru "Drug Holidays". This may help minimize withdrawals, should you run out of medicine. Control your pain thru the use of non-pharmacological interventional therapies.   Your prescriber: Prescribers cannot be blamed for shortages. Medication manufacturing and supply issues cannot be fixed by the prescriber.   NOTE: The prescriber is not responsible for supplying the medication, or solving supply issues. Work with your pharmacist to solve it. The patient is responsible for the decision to take or continue taking the medication and for identifying and securing a legal supply source. By law, supplying the medication is the job and responsibility of the pharmacy. The prescriber is responsible for the evaluation, monitoring, and prescribing of these medications.   Prescribers will NOT: Re-issue prescriptions that have been partially filled. Re-issue prescriptions already sent to a pharmacy.  Re-send prescriptions to a different pharmacy because yours did not have your medication. Ask pharmacist to order more medicine or transfer the prescription to another pharmacy. (Read below.)  New 2023 regulation: "October 03, 2021 Revised Regulation Allows DEA-Registered Pharmacies to Transfer Electronic Prescriptions at a Patient's Request DEA Headquarters Division - Public Information Office Patients now have the ability to request their electronic prescription be transferred to another pharmacy without having to go back to their practitioner to initiate the request. This revised regulation went into effect on Monday, September 29, 2021.     At a patient's request, a DEA-registered retail pharmacy can now transfer an electronic prescription for a controlled substance (schedules II-V) to another DEA-registered retail pharmacy. Prior to this change, patients would have to go through their practitioner to  cancel their prescription and have it re-issued to a different pharmacy. The process was taxing and time consuming for both patients and practitioners.    The Drug Enforcement Administration [facility]) published its intent to revise the process for transferring electronic prescriptions on December 22, 2019.  The final rule was published in the federal register on August 28, 2021 and went into effect 30 days later.  Under the final rule, a prescription can only be transferred once between pharmacies, and only if allowed under existing state or other applicable law. The prescription must remain in its electronic form; may not be altered in any way; and the transfer must be communicated directly between two licensed pharmacists. It's important to note, any authorized refills transfer with the original prescription, which means the entire prescription will be filled at the same pharmacy".  Reference: HugeHand.is [facility] website announcement)  [email or web address] [employer] of Justice)   [employer] / Vol. 88, No. 143 / Thursday, August 28, 2021 / Rules and Regulations DEPARTMENT OF JUSTICE  Drug Enforcement Administration  21 CFR Part 1306  [Docket No. [id number]]  RIN [id number] Transfer of Electronic Prescriptions for Schedules II-V Controlled Substances Between Pharmacies for Initial Filling  ____________________________________________________________________________________________  ____________________________________________________________________________________________  Transfer of Pain Medication between Pharmacies  Re: 2023 DEA Clarification on existing regulation  Published on DEA Website: October 03, 2021  Title: Revised Regulation Allows DEA-Registered Pharmacies to Electrical engineer Prescriptions at a Patient's  Request DEA Headquarters Division - [employer]  "Patients now have the ability to request their electronic prescription be transferred to another pharmacy without having to go back to their practitioner to initiate the request. This revised regulation went into effect on Monday, September 29, 2021.     At a patient's request, a DEA-registered retail pharmacy can now transfer an electronic prescription for a controlled substance (schedules II-V) to another DEA-registered retail pharmacy. Prior to this change, patients would have to go through their practitioner to cancel their prescription and have it re-issued to a different pharmacy. The process was taxing and time consuming for both patients and practitioners.    The Drug Enforcement Administration [facility]) published its intent to revise the process for transferring electronic prescriptions on December 22, 2019.  The final rule was published in the federal register on August 28, 2021 and went into effect 30 days later.  Under the final rule, a prescription can only be transferred once between pharmacies, and only if allowed under existing state or other applicable law. The prescription must remain in its electronic form; may not be altered in any way; and the transfer must be communicated directly between two licensed pharmacists. It's important to note, any authorized refills transfer with the original prescription, which means the entire prescription will be filled at the same pharmacy."    REFERENCES: 1. DEA website announcement [email or web address]  2. Department of Justice website  [email or web address].pdf  3. DEPARTMENT OF JUSTICE Drug Enforcement Administration 21 CFR Part 1306 [Docket No. [id number]] RIN [id number] "Transfer of Electronic Prescriptions for Schedules II-V Controlled Substances  Between Pharmacies for Initial Filling"  ____________________________________________________________________________________________     _______________________________________________________________________  Medication Rules  Purpose: To inform patients, and their family members, of our medication rules and regulations.  Applies to: All patients receiving prescriptions from our practice (written or electronic).  Pharmacy of record: This is the pharmacy where your electronic prescriptions will be sent. Make sure we have the correct one.  Electronic prescriptions: In compliance with the [facility] Strengthen Opioid Misuse Prevention (STOP) Act of 2017 (Session Conni Elliot [phone]), effective February 02, 2018, all controlled substances must be electronically prescribed. Written prescriptions, faxing, or calling prescriptions to a pharmacy will no longer be done.  Prescription refills: These will be provided only during in-person appointments. No medications will be renewed without a "face-to-face" evaluation with your provider. Applies to all prescriptions.  NOTE: The following applies primarily to controlled substances (Opioid* Pain Medications).   Type of encounter (visit): For patients receiving controlled substances, face-to-face visits are required. (Not an option and not up to the patient.)  Patient's responsibilities: Pain Pills: Bring all pain pills to every appointment (except for procedure appointments). Pill Bottles: Bring pills in original pharmacy bottle. Bring bottle, even if empty. Always bring the bottle of the most recent fill.  Medication refills: You are responsible for knowing and keeping track of what medications you are taking and when is it that you will need a refill. The day before your appointment: write a list of all prescriptions that need to be refilled. The day of the appointment: give the list to the admitting nurse. Prescriptions will be written only  during appointments. No prescriptions will be written on procedure days. If you forget a  medication: it will not be "Called in", "Faxed", or "electronically sent". You will need to get another appointment to get these prescribed. No early refills. Do not call asking to have your prescription filled early. Partial  or short prescriptions: Occasionally your pharmacy may not have enough pills to fill your prescription.  NEVER ACCEPT a partial fill or a prescription that is short of the total amount of pills that you were prescribed.  With controlled substances the law allows 72 hours for the pharmacy to complete the prescription.  If the prescription is not completed within 72 hours, the pharmacist will require a new prescription to be written. This means that you will be short on your medicine and we WILL NOT send another prescription to complete your original prescription.  Instead, request the pharmacy to send a carrier to a nearby branch to get enough medication to provide you with your full prescription. Prescription Accuracy: You are responsible for carefully inspecting your prescriptions before leaving our office. Have the discharge nurse carefully go over each prescription with you, before taking them home. Make sure that your name is accurately spelled, that your address is correct. Check the name and dose of your medication to make sure it is accurate. Check the number of pills, and the written instructions to make sure they are clear and accurate. Make sure that you are given enough medication to last until your next medication refill appointment. Taking Medication: Take medication as prescribed. When it comes to controlled substances, taking less pills or less frequently than prescribed is permitted and encouraged. Never take more pills than instructed. Never take the medication more frequently than prescribed.  Inform other Doctors: Always inform, all of your healthcare providers, of all the  medications you take. Pain Medication from other Providers: You are not allowed to accept any additional pain medication from any other Doctor or Healthcare provider. There are two exceptions to this rule. (see below) In the event that you require additional pain medication, you are responsible for notifying us, as stated below. Cough Medicine: Often these contain an opioid, such as codeine or hydrocodone. Never accept or take cough medicine containing these opioids if you are already taking an opioid* medication. The combination may cause respiratory failure and death. Medication Agreement: You are responsible for carefully reading and following our Medication Agreement. This must be signed before receiving any prescriptions from our practice. Safely store a copy of your signed Agreement. Violations to the Agreement will result in no further prescriptions. (Additional copies of our Medication Agreement are available upon request.) Laws, Rules, & Regulations: All patients are expected to follow all [address] and [employer], [employer], Rules, [place] [place]. Ignorance of the Laws does not constitute a valid excuse.  Illegal drugs and Controlled Substances: The use of illegal substances (including, but not limited to marijuana and its derivatives) and/or the illegal use of any controlled substances is strictly prohibited. Violation of this rule may result in the immediate and permanent discontinuation of any and all prescriptions being written by our practice. The use of any illegal substances is prohibited. Adopted CDC guidelines & recommendations: Target dosing levels will be at or below 60 MME/day. Use of benzodiazepines** is not recommended.  Exceptions: There are only two exceptions to the rule of not receiving pain medications from other Healthcare Providers. Exception #1 (Emergencies): In the event of an emergency (i.e.: accident requiring emergency care), you are allowed to receive additional pain  medication. However, you are responsible for: As soon as  you are able, call our office [phone], at any time of the day or night, and leave a message stating your name, the date and nature of the emergency, and the name and dose of the medication prescribed. In the event that your call is answered by a member of our staff, make sure to document and save the date, time, and the name of the person that took your information.  Exception #2 (Planned Surgery): In the event that you are scheduled by another doctor or dentist to have any type of surgery or procedure, you are allowed (for a period no longer than 30 days), to receive additional pain medication, for the acute post-op pain. However, in this case, you are responsible for picking up a copy of our "Post-op Pain Management for Surgeons" handout, and giving it to your surgeon or dentist. This document is available at our office, and does not require an appointment to obtain it. Simply go to our office during business hours (Monday-Thursday from 8:00 AM to 4:00 PM) (Friday 8:00 AM to 12:00 Noon) or if you have a scheduled appointment with [place], prior to your surgery, and ask for it by name. In addition, you are responsible for: calling our office [phone], at any time of the day or night, and leaving a message stating your name, name of your surgeon, type of surgery, and date of procedure or surgery. Failure to comply with your responsibilities may result in termination of therapy involving the controlled substances. Medication Agreement Violation. Following the above rules, including your responsibilities will help you in avoiding a Medication Agreement Violation ("Breaking your Pain Medication Contract").  Consequences:  Not following the above rules may result in permanent discontinuation of medication prescription therapy.  *Opioid medications include: morphine, codeine, oxycodone, oxymorphone, hydrocodone, hydromorphone, meperidine, tramadol,  tapentadol, buprenorphine, fentanyl, methadone. **Benzodiazepine medications include: diazepam (Valium), alprazolam (Xanax), clonazepam (Klonopine), lorazepam (Ativan), clorazepate (Tranxene), chlordiazepoxide (Librium), estazolam (Prosom), oxazepam (Serax), temazepam (Restoril), triazolam (Halcion) (Last updated: 11/25/2021) ______________________________________________________________________    ______________________________________________________________________  Medication Recommendations and Reminders  Applies to: All patients receiving prescriptions (written and/or electronic).  Medication Rules & Regulations: You are responsible for reading, knowing, and following our "Medication Rules" document. These exist for your safety and that of others. They are not flexible and neither are we. Dismissing or ignoring them is an act of "non-compliance" that may result in complete and irreversible termination of such medication therapy. For safety reasons, "non-compliance" will not be tolerated. As with the [place] fundamental legal principle of "ignorance of the law is no defense", we will accept no excuses for not having read and knowing the content of documents provided to you by our practice.  Pharmacy of record:  Definition: This is the pharmacy where your electronic prescriptions will be sent.  We do not endorse any particular pharmacy. It is up to you and your insurance to decide what pharmacy to use.  We do not restrict you in your choice of pharmacy. However, once we write for your prescriptions, we will NOT be re-sending more prescriptions to fix restricted supply problems created by your pharmacy, or your insurance.  The pharmacy listed in the electronic medical record should be the one where you want electronic prescriptions to be sent. If you choose to change pharmacy, simply notify our nursing staff. Changes will be made only during your regular appointments and not over the  phone.  Recommendations: Keep all of your pain medications in a safe place, under lock and key, even  if you live alone. We will NOT replace lost, stolen, or damaged medication. We do not accept "Police Reports" as proof of medications having been stolen. After you fill your prescription, take 1 week's worth of pills and put them away in a safe place. You should keep a separate, properly labeled bottle for this purpose. The remainder should be kept in the original bottle. Use this as your primary supply, until it runs out. Once it's gone, then you know that you have 1 week's worth of medicine, and it is time to come in for a prescription refill. If you do this correctly, it is unlikely that you will ever run out of medicine. To make sure that the above recommendation works, it is very important that you make sure your medication refill appointments are scheduled at least 1 week before you run out of medicine. To do this in an effective manner, make sure that you do not leave the office without scheduling your next medication management appointment. Always ask the nursing staff to show you in your prescription , when your medication will be running out. Then arrange for the receptionist to get you a return appointment, at least 7 days before you run out of medicine. Do not wait until you have 1 or 2 pills left, to come in. This is very poor planning and does not take into consideration that we may need to cancel appointments due to bad weather, sickness, or emergencies affecting our staff. DO NOT ACCEPT A "Partial Fill": If for any reason your pharmacy does not have enough pills/tablets to completely fill or refill your prescription, do not allow for a "partial fill". The law allows the pharmacy to complete that prescription within 72 hours, without requiring a new prescription. If they do not fill the rest of your prescription within those 72 hours, you will need a separate prescription to fill the remaining  amount, which we will NOT provide. If the reason for the partial fill is your insurance, you will need to talk to the pharmacist about payment alternatives for the remaining tablets, but again, DO NOT ACCEPT A PARTIAL FILL, unless you can trust your pharmacist to obtain the remainder of the pills within 72 hours.  Prescription refills and/or changes in medication(s):  Prescription refills, and/or changes in dose or medication, will be conducted only during scheduled medication management appointments. (Applies to both, written and electronic prescriptions.) No refills on procedure days. No medication will be changed or started on procedure days. No changes, adjustments, and/or refills will be conducted on a procedure day. Doing so will interfere with the diagnostic portion of the procedure. No phone refills. No medications will be "called into the pharmacy". No Fax refills. No weekend refills. No Holliday refills. No after hours refills.  Remember:  Business hours are:  Monday to Thursday 8:00 AM to 4:00 PM Provider's Schedule: Delano Metz, MD - Appointments are:  Medication management: Monday and Wednesday 8:00 AM to 4:00 PM Procedure day: Tuesday and Thursday 7:30 AM to 4:00 PM Edward Jolly, MD - Appointments are:  Medication management: Tuesday and Thursday 8:00 AM to 4:00 PM Procedure day: Monday and Wednesday 7:30 AM to 4:00 PM (Last update: 11/25/2021) ______________________________________________________________________   ____________________________________________________________________________________________  Naloxone Nasal Spray  Why am I receiving this medication? [place] STOP ACT requires that all patients taking high dose opioids or at risk of opioids respiratory depression, be prescribed an opioid reversal agent, such as Naloxone (AKA: Narcan).  What is this medication? NALOXONE (  nal OX one) treats opioid overdose, which causes slow or shallow breathing,  severe drowsiness, or trouble staying awake. Call emergency services after using this medication. You may need additional treatment. Naloxone works by reversing the effects of opioids. It belongs to a group of medications called opioid blockers.  COMMON BRAND NAME(S): Kloxxado, Narcan  What should I tell my care team before I take this medication? They need to know if you have any of these conditions: Heart disease Substance use disorder An unusual or allergic reaction to naloxone, other medications, foods, dyes, or preservatives Pregnant or trying to get pregnant Breast-feeding  When to use this medication? This medication is to be used for the treatment of respiratory depression (less than 8 breaths per minute) secondary to opioid overdose.   How to use this medication? This medication is for use in the nose. Lay the person on their back. Support their neck with your hand and allow the head to tilt back before giving the medication. The nasal spray should be given into 1 nostril. After giving the medication, move the person onto their side. Do not remove or test the nasal spray until ready to use. Get emergency medical help right away after giving the first dose of this medication, even if the person wakes up. You should be familiar with how to recognize the signs and symptoms of a narcotic overdose. If more doses are needed, give the additional dose in the other nostril. Talk to your care team about the use of this medication in children. While this medication may be prescribed for children as young as newborns for selected conditions, precautions do apply.  Naloxone Overdosage: If you think you have taken too much of this medicine contact a poison control center or emergency room at once.  NOTE: This medicine is only for you. Do not share this medicine with others.  What if I miss a dose? This does not apply.  What may interact with this medication? This is only used during an  emergency. No interactions are expected during emergency use. This list may not describe all possible interactions. Give your health care provider a list of all the medicines, herbs, non-prescription drugs, or dietary supplements you use. Also tell them if you smoke, drink alcohol, or use illegal drugs. Some items may interact with your medicine.  What should I watch for while using this medication? Keep this medication ready for use in the case of an opioid overdose. Make sure that you have the phone number of your care team and local hospital ready. You may need to have additional doses of this medication. Each nasal spray contains a single dose. Some emergencies may require additional doses. After use, bring the treated person to the nearest hospital or call 911. Make sure the treating care team knows that the person has received a dose of this medication. You will receive additional instructions on what to do during and after use of this medication before an emergency occurs.  What side effects may I notice from receiving this medication? Side effects that you should report to your care team as soon as possible: Allergic reactions--skin rash, itching, hives, swelling of the face, lips, tongue, or throat Side effects that usually do not require medical attention (report these to your care team if they continue or are bothersome): Constipation Dryness or irritation inside the nose Headache Increase in blood pressure Muscle spasms Stuffy nose Toothache This list may not describe all possible side effects. Call your doctor for  medical advice about side effects. You may report side effects to FDA at 1-800-FDA-1088.  Where should I keep my medication? Because this is an emergency medication, you should keep it with you at all times.  Keep out of the reach of children and pets. Store between 20 and 25 degrees C (68 and 77 degrees F). Do not freeze. Throw away any unused medication after the  expiration date. Keep in original box until ready to use.  NOTE: This sheet is a summary. It may not cover all possible information. If you have questions about this medicine, talk to your doctor, pharmacist, or health care provider.   2023 Elsevier/Gold Standard (2020-09-27 00:00:00)  ____________________________________________________________________________________________

## 2022-10-06 DIAGNOSIS — M6281 Muscle weakness (generalized): Secondary | ICD-10-CM | POA: Diagnosis not present

## 2022-10-06 DIAGNOSIS — R2681 Unsteadiness on feet: Secondary | ICD-10-CM | POA: Diagnosis not present

## 2022-10-07 ENCOUNTER — Ambulatory Visit: Payer: 59

## 2022-10-07 ENCOUNTER — Ambulatory Visit
Admission: RE | Admit: 2022-10-07 | Discharge: 2022-10-07 | Disposition: A | Discharge status: Home / Self Care | Payer: 59 | Source: Ambulatory Visit | Attending: Pain Medicine | Admitting: Pain Medicine

## 2022-10-07 ENCOUNTER — Ambulatory Visit (HOSPITAL_BASED_OUTPATIENT_CLINIC_OR_DEPARTMENT_OTHER): Payer: 59 | Admitting: Pain Medicine

## 2022-10-07 ENCOUNTER — Encounter: Payer: Self-pay | Admitting: Pain Medicine

## 2022-10-07 VITALS — BP 154/86 | HR 94 | Temp 97.3°F | Ht 72.0 in | Wt 264.0 lb

## 2022-10-07 DIAGNOSIS — M79605 Pain in left leg: Secondary | ICD-10-CM | POA: Insufficient documentation

## 2022-10-07 DIAGNOSIS — M48062 Spinal stenosis, lumbar region with neurogenic claudication: Secondary | ICD-10-CM

## 2022-10-07 DIAGNOSIS — R937 Abnormal findings on diagnostic imaging of other parts of musculoskeletal system: Secondary | ICD-10-CM | POA: Insufficient documentation

## 2022-10-07 DIAGNOSIS — M25562 Pain in left knee: Secondary | ICD-10-CM | POA: Insufficient documentation

## 2022-10-07 DIAGNOSIS — M5030 Other cervical disc degeneration, unspecified cervical region: Secondary | ICD-10-CM | POA: Insufficient documentation

## 2022-10-07 DIAGNOSIS — E118 Type 2 diabetes mellitus with unspecified complications: Secondary | ICD-10-CM | POA: Diagnosis not present

## 2022-10-07 DIAGNOSIS — Z79899 Other long term (current) drug therapy: Secondary | ICD-10-CM | POA: Insufficient documentation

## 2022-10-07 DIAGNOSIS — M25561 Pain in right knee: Secondary | ICD-10-CM

## 2022-10-07 DIAGNOSIS — M25652 Stiffness of left hip, not elsewhere classified: Secondary | ICD-10-CM | POA: Insufficient documentation

## 2022-10-07 DIAGNOSIS — M545 Low back pain, unspecified: Secondary | ICD-10-CM

## 2022-10-07 DIAGNOSIS — Z96641 Presence of right artificial hip joint: Secondary | ICD-10-CM | POA: Insufficient documentation

## 2022-10-07 DIAGNOSIS — G894 Chronic pain syndrome: Secondary | ICD-10-CM | POA: Insufficient documentation

## 2022-10-07 DIAGNOSIS — G8929 Other chronic pain: Secondary | ICD-10-CM | POA: Insufficient documentation

## 2022-10-07 DIAGNOSIS — M1612 Unilateral primary osteoarthritis, left hip: Secondary | ICD-10-CM | POA: Diagnosis not present

## 2022-10-07 DIAGNOSIS — M47816 Spondylosis without myelopathy or radiculopathy, lumbar region: Secondary | ICD-10-CM

## 2022-10-07 DIAGNOSIS — M25552 Pain in left hip: Secondary | ICD-10-CM

## 2022-10-07 DIAGNOSIS — R5381 Other malaise: Secondary | ICD-10-CM

## 2022-10-07 DIAGNOSIS — Z79891 Long term (current) use of opiate analgesic: Secondary | ICD-10-CM | POA: Insufficient documentation

## 2022-10-07 DIAGNOSIS — M79604 Pain in right leg: Secondary | ICD-10-CM | POA: Insufficient documentation

## 2022-10-07 DIAGNOSIS — M25512 Pain in left shoulder: Secondary | ICD-10-CM | POA: Insufficient documentation

## 2022-10-07 DIAGNOSIS — E1165 Type 2 diabetes mellitus with hyperglycemia: Secondary | ICD-10-CM | POA: Diagnosis not present

## 2022-10-07 DIAGNOSIS — M25551 Pain in right hip: Secondary | ICD-10-CM | POA: Insufficient documentation

## 2022-10-07 DIAGNOSIS — M5136 Other intervertebral disc degeneration, lumbar region: Secondary | ICD-10-CM

## 2022-10-07 DIAGNOSIS — M79652 Pain in left thigh: Secondary | ICD-10-CM

## 2022-10-07 DIAGNOSIS — M542 Cervicalgia: Secondary | ICD-10-CM | POA: Insufficient documentation

## 2022-10-07 DIAGNOSIS — Z794 Long term (current) use of insulin: Secondary | ICD-10-CM | POA: Diagnosis not present

## 2022-10-07 DIAGNOSIS — M629 Disorder of muscle, unspecified: Secondary | ICD-10-CM | POA: Insufficient documentation

## 2022-10-07 MED ORDER — MORPHINE SULFATE ER 15 MG PO TBCR: 15.0000 mg | EXTENDED_RELEASE_TABLET | Freq: Three times a day (TID) | ORAL | 0 refills | Status: DC

## 2022-10-07 MED ORDER — MORPHINE SULFATE ER 15 MG PO TBCR
15.0000 mg | EXTENDED_RELEASE_TABLET | Freq: Three times a day (TID) | ORAL | 0 refills | Status: DC
Start: 2022-11-06 — End: 2022-12-28

## 2022-10-07 NOTE — Progress Notes (Signed)
Nursing Pain Medication Assessment:  Safety precautions to be maintained throughout the outpatient stay will include: orient to surroundings, keep bed in low position, maintain call bell within reach at all times, provide assistance with transfer out of bed and ambulation.  Medication Inspection Compliance: Pill count conducted under aseptic conditions, in front of the patient. Neither the pills nor the bottle was removed from the patient's sight at any time. Once count was completed pills were immediately returned to the patient in their original bottle.  Medication: Morphine IR Pill/Patch Count:  30 of 60 pills remain Pill/Patch Appearance: Markings consistent with prescribed medication Bottle Appearance: Standard pharmacy container. Clearly labeled. Filled Date: 8 / 9 / 2024 Last Medication intake:  TodaySafety precautions to be maintained throughout the outpatient stay will include: orient to surroundings, keep bed in low position, maintain call bell within reach at all times, provide assistance with transfer out of bed and ambulation.

## 2022-10-09 ENCOUNTER — Ambulatory Visit: Payer: 59

## 2022-10-09 DIAGNOSIS — R600 Localized edema: Secondary | ICD-10-CM

## 2022-10-09 NOTE — Progress Notes (Signed)
Patient presents to the office today for diabetic shoe and insole measuring.  Patient was measured with brannock device to determine size and width for 1 pair of extra depth shoes and foam casted for 3 pair of insoles.   Documentation of medical necessity will be sent to patient's treating diabetic doctor to verify and sign.   Patient's diabetic provider: [name] MD   Shoes and insoles will be ordered at that time and patient will be notified for an appointment for fitting when they arrive.   Shoe size (per patient): 13XWD Brannock measurement: 12.5 XWD Patient shoe selection- Shoe choice:   585 / [id number] Shoe size ordered: 13XWD  ABN and Financial Form signed

## 2022-10-10 ENCOUNTER — Other Ambulatory Visit: Payer: Self-pay | Admitting: Family Medicine

## 2022-10-12 ENCOUNTER — Ambulatory Visit: Payer: 59

## 2022-10-13 ENCOUNTER — Encounter: Payer: Self-pay | Admitting: Podiatry

## 2022-10-13 ENCOUNTER — Ambulatory Visit (INDEPENDENT_AMBULATORY_CARE_PROVIDER_SITE_OTHER): Payer: 59 | Admitting: Podiatry

## 2022-10-13 DIAGNOSIS — B351 Tinea unguium: Secondary | ICD-10-CM

## 2022-10-13 DIAGNOSIS — R600 Localized edema: Secondary | ICD-10-CM

## 2022-10-13 DIAGNOSIS — M79675 Pain in left toe(s): Secondary | ICD-10-CM

## 2022-10-13 DIAGNOSIS — M79674 Pain in right toe(s): Secondary | ICD-10-CM

## 2022-10-13 NOTE — Progress Notes (Signed)
   Chief Complaint  Patient presents with   Diabetes    "Just cutting my toenails, primarily.  I'd like to ask him about the edema.  I think I have callus on my left foot."    SUBJECTIVE Patient with a history of diabetes mellitus presents to office today complaining of elongated, thickened nails that cause pain while ambulating in shoes.  Patient is unable to trim their own nails. Patient is here for further evaluation and treatment.  Past Medical History:  Diagnosis Date   Acute postoperative pain 11/24/2016   Anxiety    Chronic hip pain (Right) 12/05/2014   Chronic lumbar pain    Depression    Diabetes mellitus without complication (HCC)    Hyperlipidemia    Hypertension    Kidney stones    Migraines     Allergies  Allergen Reactions   Contrast Media [Iodinated Contrast Media] Swelling   Iodine Swelling   Shellfish Allergy Nausea And Vomiting and Swelling     OBJECTIVE General Patient is awake, alert, and oriented x 3 and in no acute distress. Derm Skin is dry and supple bilateral. Negative open lesions or macerations. Remaining integument unremarkable. Nails are tender, long, thickened and dystrophic with subungual debris, consistent with onychomycosis, 1-5 bilateral. No signs of infection noted. Vasc chronic edema noted bilateral lower extremities Neuro grossly intact via light touch Musculoskeletal Exam No symptomatic pedal deformities noted bilateral. Muscular strength within normal limits.  ASSESSMENT 1. Diabetes Mellitus w/ peripheral neuropathy 2.  Pain due to onychomycosis of toenails bilateral  PLAN OF CARE -Patient evaluated today. -Instructed to maintain good pedal hygiene and foot care. Stressed importance of controlling blood sugar.  -Mechanical debridement of nails 1-5 bilaterally performed using a nail nipper. Filed with dremel without incident.  -Patient has tried below-knee compression hose but was unable to put them on and take them off.  He  currently uses daily massage to help with his edema and swelling.  Continue. -Return to clinic in 3 mos.     Felecia Shelling, DPM [facility]  Dr. Felecia Shelling, DPM    [address] [place], [place] [address]                Office [phone]  Fax [phone]

## 2022-10-14 ENCOUNTER — Ambulatory Visit: Payer: 59 | Admitting: Physical Therapy

## 2022-10-16 ENCOUNTER — Other Ambulatory Visit: Payer: Self-pay | Admitting: Family Medicine

## 2022-10-16 DIAGNOSIS — M6281 Muscle weakness (generalized): Secondary | ICD-10-CM | POA: Diagnosis not present

## 2022-10-16 DIAGNOSIS — R2681 Unsteadiness on feet: Secondary | ICD-10-CM | POA: Diagnosis not present

## 2022-10-19 ENCOUNTER — Telehealth: Payer: Self-pay

## 2022-10-19 ENCOUNTER — Ambulatory Visit: Payer: 59

## 2022-10-19 NOTE — Telephone Encounter (Signed)
Attempted to call patient for tomorrows visit, No answer, Left voicemail to call us back.

## 2022-10-20 ENCOUNTER — Telehealth: Payer: Self-pay | Admitting: Pain Medicine

## 2022-10-20 ENCOUNTER — Ambulatory Visit: Payer: 59 | Attending: Pain Medicine | Admitting: Pain Medicine

## 2022-10-20 DIAGNOSIS — G8929 Other chronic pain: Secondary | ICD-10-CM

## 2022-10-20 DIAGNOSIS — Z91199 Patient's noncompliance with other medical treatment and regimen due to unspecified reason: Secondary | ICD-10-CM

## 2022-10-20 NOTE — Telephone Encounter (Signed)
Requesting: Alprazolam Contract: No UDS: No Last Visit: 09/22/2022 Next Visit: not scheduled Last Refill: 08/28/2022  Please Advise

## 2022-10-20 NOTE — Telephone Encounter (Signed)
Called patient back for VV appt today. Instructed him to talk with MD about his pain today.

## 2022-10-20 NOTE — Telephone Encounter (Signed)
Please call patient regarding increased pain and new morphine dose.

## 2022-10-21 ENCOUNTER — Ambulatory Visit: Payer: 59 | Admitting: Physical Therapy

## 2022-10-22 NOTE — Progress Notes (Signed)
(  10/20/2022) Appointment to review x-rays rescheduled since results were not available at this time.

## 2022-10-23 ENCOUNTER — Other Ambulatory Visit: Payer: Self-pay | Admitting: Family Medicine

## 2022-10-23 DIAGNOSIS — K219 Gastro-esophageal reflux disease without esophagitis: Secondary | ICD-10-CM

## 2022-10-23 DIAGNOSIS — N401 Enlarged prostate with lower urinary tract symptoms: Secondary | ICD-10-CM

## 2022-10-23 DIAGNOSIS — I10 Essential (primary) hypertension: Secondary | ICD-10-CM

## 2022-10-23 NOTE — Progress Notes (Unsigned)
Referring Physician:  Delano Metz, MD [address] [facility] [address] [place],  [place] [address]  Primary Physician:  Glori Luis, MD  History of Present Illness: 10/29/2022 Alan Schmidt is here today with a chief complaint of lower back pain that radiates into his right leg.    The pain is primarily located in the lower back, radiating into the hips, and has been progressively worsening. The patient reports difficulty in walking due to the pain, and has to stop after short distances. He also reports a recent onset of pain in the left hip, which he previously referred to as his "good leg".  The patient has been undergoing physical therapy for approximately two and a half months, but reports no significant improvement in his symptoms. He also reports having had several injections for his back pain in the past, but the last one was approximately six to seven months ago. The patient occasionally experiences numbness in his legs, and has been prescribed compression socks for this issue. However, he reports that his legs have been persistently swollen since he started wearing the compression socks.  The patient also reports a history of a dislocated pelvis and multiple surgeries on his right leg, starting from the age of [age]. He reports having lived with more pain than most people ever will, and that standing in one position for extended periods causes pain in his legs. The patient also mentions a quarter-inch discrepancy in leg length, with the right leg being shorter, but acknowledges that this is not the primary cause of his current pain.  The patient is still actively working as a Radiographer, therapeutic, despite the pain and physical limitations. He recently released a CD and is currently working on two more projects. He expresses a strong will to press through his physical challenges, attributing his resilience to his faith and the power of his mind.    Bowel/Bladder Dysfunction:  none  Conservative measures:  Physical therapy: currently participating in at [facility] Multimodal medical therapy including regular antiinflammatories: Tylenol, Gabapentin, robaxin, Morphine,   Injections: has received epidural steroid injections 04/14/22: Bilateral L3-S1 medial branch block (Dr. Laban Emperor) 12/16/21: Bilateral L3-S1 medial branch block (Dr. Laban Emperor)   Past Surgery: no previous spinal surgeries  Alan Schmidt has no symptoms of cervical myelopathy.  The symptoms are causing a significant impact on the patient's life.   I have utilized the care everywhere function in epic to review the outside records available from external health systems.  Review of Systems:  A 10 point review of systems is negative, except for the pertinent positives and negatives detailed in the HPI.  Past Medical History: Past Medical History:  Diagnosis Date   Acute postoperative pain 11/24/2016   Anxiety    Chronic hip pain (Right) 12/05/2014   Chronic lumbar pain    Depression    Diabetes mellitus without complication (HCC)    Hyperlipidemia    Hypertension    Kidney stones    Migraines     Past Surgical History: Past Surgical History:  Procedure Laterality Date   right hip surgery     4 surgeries   TONSILLECTOMY     TOTAL HIP ARTHROPLASTY      Allergies: Allergies as of 10/29/2022 - Review Complete 10/29/2022  Allergen Reaction Noted   Contrast media [iodinated contrast media] Swelling 12/05/2014   Iodine Swelling 12/05/2014   Shellfish allergy Nausea And Vomiting and Swelling 12/05/2014    Medications:  Current Outpatient Medications:  ACCU-CHEK GUIDE test strip, USE AS INSTRUCTED DX CODE: E11.9, Disp: 100 strip, Rfl: 5   acetaminophen (TYLENOL) 500 MG tablet, Take 500 mg by mouth every 6 (six) hours as needed., Disp: , Rfl:    ALPRAZolam (XANAX) 1 MG tablet, TAKE 1/2 TABLET BY MOUTH 2 TIMES DAILY AS NEEDED FOR ANXIETY., Disp: 30 tablet, Rfl: 0   aspirin EC 81 MG  tablet, Take 81 mg by mouth daily., Disp: , Rfl:    buPROPion (WELLBUTRIN XL) 150 MG 24 hr tablet, TAKE 1 TABLET (150 MG TOTAL) BY MOUTH DAILY FOR 7 DAYS, THEN 2 TABLETS (300 MG TOTAL) DAILY., Disp: 180 tablet, Rfl: 0   carvedilol (COREG) 25 MG tablet, Take 1 tablet (25 mg total) by mouth 2 (two) times daily., Disp: 180 tablet, Rfl: 3   Continuous Blood Gluc Receiver (FREESTYLE LIBRE 14 DAY READER) DEVI, 1 Device by Does not apply route daily. Use to scan to check blood sugar up to 7 times daily; E11.42,, Disp: 1 Device, Rfl: 1   Continuous Glucose Sensor (FREESTYLE LIBRE 14 DAY SENSOR) MISC, 1 Device by Does not apply route every 14 (fourteen) days. E11.42, Disp: 6 each, Rfl: 3   desoximetasone (TOPICORT) 0.25 % cream, APPLY CREAM TO AFFECTED AREA TWO TIMES DAILY, FOR UP TO 7 DAYS, DO NOT APPLY TO FACE   Strength: 0.25 %, Disp: 30 g, Rfl: 0   empagliflozin (JARDIANCE) 25 MG TABS tablet, Take 1 tablet (25 mg total) by mouth daily., Disp: 90 tablet, Rfl: 0   EPINEPHrine (EPIPEN 2-PAK) 0.3 mg/0.3 mL IJ SOAJ injection, Inject 0.3 mg into the muscle as needed for anaphylaxis., Disp: 2 each, Rfl: 0   escitalopram (LEXAPRO) 20 MG tablet, Take 1 tablet by mouth daily., Disp: 90 tablet, Rfl: 1   gabapentin (NEURONTIN) 250 MG/5ML solution, Take 6 ml by mouth at bedtime., Disp: 180 mL, Rfl: 1   hydrochlorothiazide (HYDRODIURIL) 25 MG tablet, Take 1 tablet by mouth daily., Disp: 90 tablet, Rfl: 0   insulin degludec (TRESIBA FLEXTOUCH) 100 UNIT/ML FlexTouch Pen, INJECT 13 UNITS UNDER THE SKIN DAILY, Disp: 15 mL, Rfl: 1   insulin lispro (HUMALOG KWIKPEN) 100 UNIT/ML KwikPen, INJECT 5 TO 7 UNITS UNDER THE SKIN THREE TIMES DAILY WITH MEALS, Disp: 15 mL, Rfl: 1   Insulin Pen Needle (BD PEN NEEDLE NANO U/F) 32G X 4 MM MISC, USE EVERY DAY, Disp: 100 each, Rfl: 4   loratadine (CLARITIN) 10 MG tablet, Take 1 tablet by mouth daily., Disp: 30 tablet, Rfl: 10   metFORMIN (GLUCOPHAGE) 1000 MG tablet, Take 1 tablet by mouth  twice daily with a meal., Disp: 180 tablet, Rfl: 1   methocarbamol (ROBAXIN) 500 MG tablet, Take 500 mg by mouth 2 (two) times daily., Disp: , Rfl:    mometasone (NASONEX) 50 MCG/ACT nasal spray, USE 2 SPRAYS INTO EACH NOSTRIL ONCE DAILY, Disp: 34 each, Rfl: 1   morphine (MS CONTIN) 15 MG 12 hr tablet, Take 1 tablet (15 mg total) by mouth every 8 (eight) hours. Must last 30 days. Do not break tablet, Disp: 90 tablet, Rfl: 0   [START ON 11/06/2022] morphine (MS CONTIN) 15 MG 12 hr tablet, Take 1 tablet (15 mg total) by mouth every 8 (eight) hours. Must last 30 days. Do not break tablet, Disp: 90 tablet, Rfl: 0   [START ON 12/06/2022] morphine (MS CONTIN) 15 MG 12 hr tablet, Take 1 tablet (15 mg total) by mouth every 8 (eight) hours. Must last 30 days. Do not break  tablet, Disp: 90 tablet, Rfl: 0   naloxone (NARCAN) nasal spray 4 mg/0.1 mL, Place 1 spray into nostril as needed for up to 365 doses (for opioid-induced respiratory depresssion). In case of emergency (overdose), spray once into each nostril. If no response within 3 minutes, repeat application and call 911., Disp: 2 each, Rfl: 0   olmesartan (BENICAR) 40 MG tablet, Take 1 tablet by mouth daily., Disp: 90 tablet, Rfl: 0   pantoprazole (PROTONIX) 40 MG tablet, Take 1 tablet by mouth daily., Disp: 90 tablet, Rfl: 0   rosuvastatin (CRESTOR) 40 MG tablet, Take 1 tablet by mouth daily., Disp: 90 tablet, Rfl: 3   sennosides-docusate sodium (SENOKOT-S) 8.6-50 MG tablet, Take 1 tablet by mouth daily as needed. , Disp: , Rfl:    tamsulosin (FLOMAX) 0.4 MG CAPS capsule, Take 1 capsule by mouth twice daily., Disp: 180 capsule, Rfl: 0   tirzepatide (MOUNJARO) 7.5 MG/0.5ML Pen, Inject 7.5 mg into the skin once a week., Disp: 6 mL, Rfl: 2   lubiprostone (AMITIZA) 8 MCG capsule, TAKE 1 CAPSULE (8 MCG TOTAL) BY MOUTH 2 (TWO) TIMES DAILY WITH A MEAL. SWALLOW THE MEDICATION WHOLE. DO NOT BREAK OR CHEW THE MEDICATION., Disp: 180 capsule, Rfl: 1   morphine (MS  CONTIN) 30 MG 12 hr tablet, Take 1 tablet (30 mg total) by mouth every 12 (twelve) hours. Must last 30 days. Do not break tablet, Disp: 60 tablet, Rfl: 0   Wheat Dextrin (BENEFIBER) POWD, Take 6 g by mouth 3 (three) times daily before meals. (2 tsp = 6 g), Disp: 730 g, Rfl: 2  Social History: Social History   Tobacco Use   Smoking status: Former    Types: Cigarettes   Smokeless tobacco: Never  Vaping Use   Vaping status: Never Used  Substance Use Topics   Alcohol use: No    Alcohol/week: 0.0 standard drinks of alcohol   Drug use: No    Family Medical History: Family History  Problem Relation Age of Onset   Cancer Mother    Heart disease Father    Stroke Father    Diabetes Father    Diabetes Sister    Diabetes Sister     Physical Examination: There were no vitals filed for this visit.  General: Patient is in no apparent distress. Attention to examination is appropriate.  Neck:   Supple.  Full range of motion.  Respiratory: Patient is breathing without any difficulty.   NEUROLOGICAL:     Awake, alert, oriented to person, place, and time.  Speech is clear and fluent.   Cranial Nerves: Pupils equal round and reactive to light.  Facial tone is symmetric.  Facial sensation is symmetric. Shoulder shrug is symmetric. Tongue protrusion is midline.  There is no pronator drift.  Strength: Side Biceps Triceps Deltoid Interossei Grip Wrist Ext. Wrist Flex.  R 5 5 5 5 5 5 5   L 5 5 5 5 5 5 5    Side Iliopsoas Quads Hamstring PF DF EHL  R 5 5 5 5 5 5   L 5 5 5 5 5 5    Reflexes are 1+ and symmetric at the biceps, triceps, brachioradialis, patella and achilles.   Hoffman's is absent.   Bilateral upper and lower extremity sensation is intact to light touch.    No evidence of dysmetria noted.  Gait is antalgic and requires a cane.  Positive straight leg raise on the right at 45 degrees   Medical Decision Making  Imaging: MRI L spine  06/02/2022 IMPRESSION: 1. Multilevel  lumbar spondylosis, worst at L4-5, where there is moderate-to-severe spinal canal stenosis. 2. Moderate spinal canal stenosis at L2-3. 3. At L5-S1, a right subarticular disc protrusion compresses the traversing right S1 nerve root. 4. Mild spinal canal stenosis at L3-4.     Electronically Signed   By: Orvan Falconer M.D.   On: 06/02/2022 16:01    I have personally reviewed the images and agree with the above interpretation.  Assessment and Plan: Alan Schmidt is a pleasant 77 y.o. male with neurogenic claudication and lumbar radiculopathy.  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  He has tried and failed conservative management including physical therapy, medications, and injections.  At this point, no further conservative management is indicated.  I recommended L2-3 and L4-5 decompression with a right sided L5-S1 microdiscectomy.  His diabetes has not been under appropriate control for elective surgical intervention in the past.  His last A1c is 10%.  I will send him for a repeat today.  He showed me his sugar control which has been much improved over the past several months.  I am hopeful that his A1c will be under 7.5% so we can proceed with surgical intervention.  I discussed the planned procedure at length with the patient, including the risks, benefits, alternatives, and indications. The risks discussed include but are not limited to bleeding, infection, need for reoperation, spinal fluid leak, stroke, vision loss, anesthetic complication, coma, paralysis, and even death. I also described in detail that improvement was not guaranteed.  The patient expressed understanding of these risks, and asked that we proceed with surgery. I described the surgery in layman's terms, and gave ample opportunity for questions, which were answered to the best of my ability.     Thank you for involving me in the care of this patient.      Tommye Lehenbauer  K. Myer Haff MD, [facility]

## 2022-10-25 NOTE — Progress Notes (Unsigned)
Patient: Alan Schmidt  Service Category: E/M  Provider: Oswaldo Done, MD  DOB: November 18, 1945  DOS: 10/26/2022  Location: Office  MRN: [id number]  Setting: Ambulatory outpatient  Referring Provider: Glori Luis, MD  Type: Established Patient  Specialty: Interventional Pain Management  PCP: Glori Luis, MD  Location: Remote location  Delivery: TeleHealth     Virtual Encounter - Pain Management PROVIDER NOTE: Information contained herein reflects review and annotations entered in association with encounter. Interpretation of such information and data should be left to medically-trained personnel. Information provided to patient can be located elsewhere in the medical record under "Patient Instructions". Document created using STT-dictation technology, any transcriptional errors that may result from process are unintentional.    Contact & Pharmacy Preferred: [phone] Home: [phone] (home) Mobile: [phone] (mobile) E-mail: [email or web address]  [facility] [phone] [place], [place] - [address] [place] [address] Phone: [phone] Fax: [phone]  [facility] - [place], [place] - [address] [place] [address] Phone: [phone] Fax: [phone]   Pre-screening  Alan Schmidt offered "in-person" vs "virtual" encounter. He indicated preferring virtual for this encounter.   Reason COVID-19*  Social distancing based on CDC and AMA recommendations.   I contacted Alan Schmidt on 10/26/2022 via telephone.      I clearly identified myself as Oswaldo Done, MD. I verified that I was speaking with the correct person using two identifiers (Name: Alan Schmidt, and date of birth: [birth year]).  Consent I sought verbal advanced consent from Alan Schmidt for virtual visit interactions. I informed Alan Schmidt of possible security and privacy concerns, risks, and limitations  associated with providing "not-in-person" medical evaluation and management services. I also informed Alan Schmidt of the availability of "in-person" appointments. Finally, I informed him that there would be a charge for the virtual visit and that he could be  personally, fully or partially, financially responsible for it. Alan Schmidt expressed understanding and agreed to proceed.   Historic Elements   Alan Schmidt is a 77 y.o. year old, male patient evaluated today after our last contact on 10/20/2022. Alan Schmidt  has a past medical history of Acute postoperative pain (11/24/2016), Anxiety, Chronic hip pain (Right) (12/05/2014), Chronic lumbar pain, Depression, Diabetes mellitus without complication (HCC), Hyperlipidemia, Hypertension, Kidney stones, and Migraines. He also  has a past surgical history that includes Total hip arthroplasty; Tonsillectomy; and right hip surgery. Alan Schmidt has a current medication list which includes the following prescription(s): accu-chek guide, acetaminophen, alprazolam, aspirin ec, bupropion, carvedilol, freestyle libre 14 day reader, freestyle libre 14 day sensor, desoximetasone, empagliflozin, epinephrine, escitalopram, gabapentin, hydrochlorothiazide, insulin lispro, bd pen needle nano u/f, loratadine, lubiprostone, metformin, methocarbamol, mometasone, morphine, [START ON 11/06/2022] morphine, [START ON 12/06/2022] morphine, morphine, naloxone, olmesartan, pantoprazole, rosuvastatin, sennosides-docusate sodium, tamsulosin, tirzepatide, tresiba flextouch, and benefiber. He  reports that he has quit smoking. His smoking use included cigarettes. He has never used smokeless tobacco. He reports that he does not drink alcohol and does not use drugs. Alan Schmidt is allergic to contrast media [iodinated contrast media], iodine, and shellfish allergy.  BMI: Estimated body mass index is 35.8 kg/m as calculated from the following:   Height as of 10/07/22: 6' (1.829 m).    Weight as of 10/07/22: 264 lb (119.7 kg). Last encounter: 10/20/2022. Last procedure: 04/14/2022.  HPI  Today, he is being contacted for follow-up evaluation to review Left Hip Dx X-rays.  EXAM:  DG HIP (WITH OR WITHOUT PELVIS) 2-3V LEFT   COMPARISON:  None Available.   FINDINGS: No acute fracture or dislocation. No aggressive osseous lesion. Normal alignment. Generalized osteopenia. Right total hip arthroplasty without hardware failure or complication. Mild osteoarthritis of the left hip. Osseous prominence of the superior femoral head-neck junction.   Soft tissue are unremarkable. No radiopaque foreign body or soft tissue emphysema.   IMPRESSION: 1. Right total hip arthroplasty. 2. Mild osteoarthritis of the left hip.    Electronically Signed   By: Elige Ko M.D.   On: 10/24/2022 15:00  The above results were shared with the patient today.  I have explained those in layman's terms.  I have also recommended that he take vitamin D, calcium, and magnesium to help with his osteopenia.  In addition, I have encouraged him to sign up with MyChart so that he can take advantage of the information that I have added to his AVS.  I have recommended that he consider taking some turmeric/curcumin, glucosamine/chondroitin, and perhaps some moringa.  Pharmacotherapy Assessment   Opioid Analgesic: Morphine ER (MS Contin) 30 mg, 1 tab p.o. twice daily (60 mg/day).  (Previously on oxycodone IR 10 mg every 6 hours (40 mg/day)) MME/day: 60 mg/day.   Monitoring: [place] PMP: PDMP reviewed during this encounter.       Pharmacotherapy: No side-effects or adverse reactions reported. Compliance: No problems identified. Effectiveness: Clinically acceptable. Plan: Refer to "POC". UDS:  Summary  Date Value Ref Range Status  07/08/2022 Note  Final    Comment:    ==================================================================== [employer] Select 13  (MW) ==================================================================== Test                             Result       Flag       Units  Drug Present and Declared for Prescription Verification   Alprazolam                     58           EXPECTED   ng/mg creat   Alpha-hydroxyalprazolam        142          EXPECTED   ng/mg creat    Source of alprazolam is a scheduled prescription medication. Alpha-    hydroxyalprazolam is an expected metabolite of alprazolam.    Morphine                       15430        EXPECTED   ng/mg creat   Normorphine                    570          EXPECTED   ng/mg creat    Potential sources of large amounts of morphine in the absence of    codeine include administration of morphine or use of heroin.     Normorphine is an expected metabolite of morphine.    Hydromorphone                  362          EXPECTED   ng/mg creat    Hydromorphone may be present as a metabolite of morphine;    concentrations of hydromorphone rarely exceed 5% of the morphine    concentration when this is the source of hydromorphone.  ==================================================================== Test  Result    Flag   Units      Ref Range   Creatinine              50               mg/dL      >=28 ==================================================================== Declared Medications:  The flagging and interpretation on this report are based on the  following declared medications.  Unexpected results may arise from  inaccuracies in the declared medications.   **Note: The testing scope of this panel includes these medications:   Alprazolam  Morphine (MS Contin)   **Note: The testing scope of this panel does not include the  following reported medications:   Acetaminophen (Tylenol)  Aspirin  Bupropion (Wellbutrin XL)  Carvedilol (Coreg)  Desoximetasone (Topicort)  Empagliflozin (Jardiance)  Epinephrine (EpiPen)  Escitalopram (Lexapro)   Gabapentin (Neurontin)  Hydrochlorothiazide  Insulin Alan Schmidt)  Loratadine (Claritin)  Lubiprostone (Amitiza)  Metformin  Methocarbamol  Mometasone (Nasonex)  Naloxone (Narcan)  Olmesartan (Benicar)  Pantoprazole (Protonix)  Rosuvastatin (Crestor)  Supplement (Fiber)  Tamsulosin (Flomax)  Tirzepatide (Mounjaro) ==================================================================== For clinical consultation, please call [phone]. ====================================================================    No results found for: "CBDTHCR", "D8THCCBX", "D9THCCBX"   Laboratory Chemistry Profile   Renal Lab Results  Component Value Date   BUN 8 09/22/2022   CREATININE 0.85 09/22/2022   BCR NOT APPLICABLE 04/25/2021   GFR 84.35 09/22/2022   GFRAA >60 03/26/2018   GFRNONAA >60 03/26/2018    Hepatic Lab Results  Component Value Date   AST 21 09/22/2022   ALT 20 09/22/2022   ALBUMIN 3.9 09/22/2022   ALKPHOS 83 09/22/2022    Electrolytes Lab Results  Component Value Date   NA 140 09/22/2022   K 3.8 09/22/2022   CL 99 09/22/2022   CALCIUM 9.2 09/22/2022   MG 1.8 03/06/2015    Bone No results found for: "VD25OH", "VD125OH2TOT", "VO5366YQ0", "HK7425ZD6", "25OHVITD1", "25OHVITD2", "25OHVITD3", "TESTOFREE", "TESTOSTERONE"  Inflammation (CRP: Acute Phase) (ESR: Chronic Phase) Lab Results  Component Value Date   CRP 0.5 03/06/2015   ESRSEDRATE 45 (H) 12/14/2016         Note: Above Lab results reviewed.  Imaging  DG HIP UNILAT W OR W/O PELVIS 2-3 VIEWS LEFT CLINICAL DATA:  Left hip pain, left thigh pain  EXAM: DG HIP (WITH OR WITHOUT PELVIS) 2-3V LEFT  COMPARISON:  None Available.  FINDINGS: No acute fracture or dislocation. No aggressive osseous lesion. Normal alignment. Generalized osteopenia. Right total hip arthroplasty without hardware failure or complication. Mild osteoarthritis of the left hip. Osseous prominence of the superior femoral head-neck  junction.  Soft tissue are unremarkable. No radiopaque foreign body or soft tissue emphysema.  IMPRESSION: 1. Right total hip arthroplasty. 2. Mild osteoarthritis of the left hip.  Electronically Signed   By: Elige Ko M.D.   On: 10/24/2022 15:00  Assessment  The primary encounter diagnosis was Chronic hip pain (Left). 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  Plan of Care  Problem-specific:  No problem-specific Assessment & Plan notes found for this encounter.  Alan Schmidt has a current medication list which includes the following long-term medication(s): bupropion, carvedilol, escitalopram, gabapentin, hydrochlorothiazide, insulin lispro, loratadine, lubiprostone, metformin, mometasone, morphine, [START ON 11/06/2022] morphine, [START ON 12/06/2022] morphine, morphine, olmesartan, pantoprazole, rosuvastatin, and benefiber.  Pharmacotherapy (Medications Ordered): No orders of the defined types were placed in this encounter.  Orders:  No orders of the defined types were placed in this encounter.  Follow-up plan:   No follow-ups on file.      Interventional Therapies  Risk Factors  Considerations:   Allergy: CONTRAST, IODINE, & shellfish  VASOVAGAL RXN BNZ Tx   MO (L-FCT 5" needle to the hub)      NO MORE RFA - intraoperative noncompliance, unnecessarily increasing radiation exposure NOTE: NO MORE RFA procedures. Difficulty following intra-procedural instructions and commands, resulting in unnecessarily long exposure of staff to radiation.    Planned  Pending:   Therapeutic bilateral (L4-5, L5-S1) lumbar facet (L3-S1) MBB #4    Under consideration:   Diagnostic Left GONB    Completed:   Therapeutic bilateral lumbar facet (L2-S1) MBB x3 (12/16/21) (100/100/80/80)  Therapeutic left lumbar facet (L2-S1 MB) RFA x2 (05/09/2019) (90/90/77/>75)   Therapeutic right lumbar facet (L2-S1 MB) RFA x6 (12/14/2019) (100/100/70/70) (No intra-op compliance)  Diagnostic/therapeutic right small joint inj. (thumb) x1 (03/15/2018) (N/A)  Diagnostic/therapeutic left cervical ESI x1 (12/18/2015) (90/70/50/50)    Completed by other providers:   None at this time   Therapeutic  Palliative (PRN) options:   Palliative lumbar facet MBB  Therapeutic cervical ESI    Pharmacotherapy  Nonopioids transferred 12/14/2019: Gabapentin, Amitiza, Benefiber, Mobic       Recent Visits Date Type Provider Dept  10/07/22 Office Visit Delano Metz, MD [facility]  08/10/22 Office Visit Delano Metz, MD [facility]  Showing recent visits within past 90 days and meeting all other requirements Today's Visits Date Type Provider Dept  10/26/22 Office Visit Delano Metz, MD [facility]  Showing today's visits and meeting all other requirements Future Appointments Date Type Provider Dept  12/28/22 Appointment Delano Metz, MD [facility]  Showing future appointments within next 90 days and meeting all other requirements  I discussed the assessment and treatment plan with the patient. The patient was provided an opportunity to ask questions and all were answered. The patient agreed with the plan and demonstrated an understanding of the instructions.  Patient advised to call back or seek an in-person evaluation if the symptoms or condition worsens.  Duration of encounter: 15 minutes.  Note by: Oswaldo Done, MD Date: 10/26/2022; Time: 3:31 PM

## 2022-10-26 ENCOUNTER — Other Ambulatory Visit: Payer: Self-pay | Admitting: Family Medicine

## 2022-10-26 ENCOUNTER — Ambulatory Visit: Payer: 59 | Attending: Pain Medicine | Admitting: Pain Medicine

## 2022-10-26 ENCOUNTER — Ambulatory Visit: Payer: 59

## 2022-10-26 ENCOUNTER — Other Ambulatory Visit: Payer: Self-pay | Admitting: Family

## 2022-10-26 DIAGNOSIS — Z96641 Presence of right artificial hip joint: Secondary | ICD-10-CM

## 2022-10-26 DIAGNOSIS — G8929 Other chronic pain: Secondary | ICD-10-CM

## 2022-10-26 DIAGNOSIS — M159 Polyosteoarthritis, unspecified: Secondary | ICD-10-CM | POA: Diagnosis not present

## 2022-10-26 DIAGNOSIS — M25551 Pain in right hip: Secondary | ICD-10-CM

## 2022-10-26 DIAGNOSIS — M25552 Pain in left hip: Secondary | ICD-10-CM

## 2022-10-26 DIAGNOSIS — T402X5A Adverse effect of other opioids, initial encounter: Secondary | ICD-10-CM

## 2022-10-26 DIAGNOSIS — Z794 Long term (current) use of insulin: Secondary | ICD-10-CM

## 2022-10-26 NOTE — Patient Instructions (Addendum)
______________________________________________________________________    OTC Supplements: The following over-the-counter (OTC) supplements may be of some benefits when used in moderation in some chronic pain conditions. Note: Always consult with your primary care provider and/or pharmacist before taking any OTC medications to make sure there are no "drug-to-drug" interactions with the medications you currently take. Ask your physician which may be beneficial for your particular condition.  Supplement Possible benefit May be of benefit in treatment of   Turmeric/curcumin* anti-inflammatory Joint and muscle aches and pain associated with arthritis and inflammation  Glucosamine/chondroitin (triple strength)* may slow loss of articular cartilage Osteoarthritis  Vitamin D-3* may suppress release of chemicals associated with inflammation Joint and muscle aches and pain associated with arthritis and inflammation  Moringa* anti-inflammatory with mild analgesic effects Joint and muscle aches and pain associated with arthritis and inflammation  Melatonin* Helps reset sleep cycle. Insomnia. May also be helpful in neurodegenerative disorders  Vitamin B-12* may help keep nerves and blood cells healthy as well as maintaining function of nervous system Neuropathies. Nerve pain (Burning pain)  Alpha-Lipoic-Acid (ALA)* antioxidant that may help with nerve health, pain, and blocking the activation of some inflammatory chemicals Diabetic neuropathy and metabolic syndrome       (*Always use manufacturer's recommended dosage.)  ______________________________________________________________________      ______________________________________________________________________    Pain Prevention Technique  Definition:   A technique used to minimize the effects of an activity known to cause inflammation or swelling, which in turn leads to an increase in pain.  Purpose: To prevent swelling from occurring. It is  based on the fact that it is easier to prevent swelling from happening than it is to get rid of it, once it occurs.  Contraindications: Anyone with allergy or hypersensitivity to the recommended medications. Anyone taking anticoagulants (Blood Thinners) (e.g., Coumadin, Warfarin, Plavix, etc.). Patients in Renal Failure or having chronic kidney disease.  Technique: Before you undertake an activity known to cause pain, or a flare-up of your chronic pain, and before you experience any pain, do the following:  On a full stomach, take 4 (four) over the counter Ibuprofens 200mg  tablets (Motrin), for a total of 800 mg. In addition, take over the counter Magnesium 400 to 500 mg, before doing the activity.  Six (6) hours later, again on a full stomach, repeat the Ibuprofen. That night, take a warm shower and stretch under the running warm water.  This technique may be sufficient to abort the pain and discomfort before it happens. Keep in mind that it takes a lot less medication to prevent swelling than it takes to eliminate it once it occurs.   Last Update: 08/13/2022  ______________________________________________________________________

## 2022-10-27 DIAGNOSIS — R2681 Unsteadiness on feet: Secondary | ICD-10-CM | POA: Diagnosis not present

## 2022-10-27 DIAGNOSIS — M6281 Muscle weakness (generalized): Secondary | ICD-10-CM | POA: Diagnosis not present

## 2022-10-28 ENCOUNTER — Ambulatory Visit: Payer: 59

## 2022-10-28 NOTE — Telephone Encounter (Signed)
He should be on Mounjaro 7.5 mg weekly.  Please contact the patient and make sure he is taking that dose.  Thanks.

## 2022-10-29 ENCOUNTER — Ambulatory Visit: Payer: 59 | Admitting: Neurosurgery

## 2022-10-29 ENCOUNTER — Encounter: Payer: Self-pay | Admitting: Neurosurgery

## 2022-10-29 VITALS — BP 114/70 | Ht 72.0 in | Wt 256.0 lb

## 2022-10-29 DIAGNOSIS — M48062 Spinal stenosis, lumbar region with neurogenic claudication: Secondary | ICD-10-CM | POA: Diagnosis not present

## 2022-10-29 DIAGNOSIS — Z794 Long term (current) use of insulin: Secondary | ICD-10-CM | POA: Diagnosis not present

## 2022-10-29 DIAGNOSIS — M5416 Radiculopathy, lumbar region: Secondary | ICD-10-CM

## 2022-10-29 DIAGNOSIS — M5117 Intervertebral disc disorders with radiculopathy, lumbosacral region: Secondary | ICD-10-CM

## 2022-10-29 DIAGNOSIS — E1142 Type 2 diabetes mellitus with diabetic polyneuropathy: Secondary | ICD-10-CM | POA: Diagnosis not present

## 2022-10-29 NOTE — Telephone Encounter (Signed)
LVM

## 2022-11-02 ENCOUNTER — Ambulatory Visit: Payer: 59

## 2022-11-04 ENCOUNTER — Ambulatory Visit: Payer: 59

## 2022-11-05 ENCOUNTER — Other Ambulatory Visit: Payer: Self-pay | Admitting: Family Medicine

## 2022-11-05 DIAGNOSIS — F419 Anxiety disorder, unspecified: Secondary | ICD-10-CM

## 2022-11-06 DIAGNOSIS — M6281 Muscle weakness (generalized): Secondary | ICD-10-CM | POA: Diagnosis not present

## 2022-11-06 DIAGNOSIS — R2681 Unsteadiness on feet: Secondary | ICD-10-CM | POA: Diagnosis not present

## 2022-11-06 DIAGNOSIS — E1165 Type 2 diabetes mellitus with hyperglycemia: Secondary | ICD-10-CM | POA: Diagnosis not present

## 2022-11-06 DIAGNOSIS — E118 Type 2 diabetes mellitus with unspecified complications: Secondary | ICD-10-CM | POA: Diagnosis not present

## 2022-11-06 DIAGNOSIS — Z794 Long term (current) use of insulin: Secondary | ICD-10-CM | POA: Diagnosis not present

## 2022-11-09 ENCOUNTER — Ambulatory Visit: Payer: 59

## 2022-11-09 DIAGNOSIS — E1165 Type 2 diabetes mellitus with hyperglycemia: Secondary | ICD-10-CM | POA: Diagnosis not present

## 2022-11-09 DIAGNOSIS — E160 Drug-induced hypoglycemia without coma: Secondary | ICD-10-CM | POA: Diagnosis not present

## 2022-11-09 DIAGNOSIS — Z794 Long term (current) use of insulin: Secondary | ICD-10-CM | POA: Diagnosis not present

## 2022-11-09 DIAGNOSIS — T383X5A Adverse effect of insulin and oral hypoglycemic [antidiabetic] drugs, initial encounter: Secondary | ICD-10-CM | POA: Diagnosis not present

## 2022-11-09 LAB — HEMOGLOBIN A1C: Hemoglobin A1C: 8.8

## 2022-11-11 ENCOUNTER — Ambulatory Visit: Payer: 59

## 2022-11-13 ENCOUNTER — Encounter (INDEPENDENT_AMBULATORY_CARE_PROVIDER_SITE_OTHER): Payer: 59 | Admitting: Vascular Surgery

## 2022-11-13 ENCOUNTER — Telehealth: Payer: Self-pay | Admitting: Podiatry

## 2022-11-13 NOTE — Telephone Encounter (Signed)
Pt left messages for you earlier this week. HE was checking on diabetic shoes status.   I have scheduled him to pick them up 10/24

## 2022-11-15 NOTE — Progress Notes (Signed)
Called patient LM that order is in next day in [place] is 11/1 and to please call and set fitting appt  Alan Schmidt

## 2022-11-16 ENCOUNTER — Ambulatory Visit: Payer: 59

## 2022-11-18 ENCOUNTER — Ambulatory Visit: Payer: 59

## 2022-11-23 ENCOUNTER — Telehealth: Payer: Self-pay | Admitting: Neurosurgery

## 2022-11-23 NOTE — Telephone Encounter (Signed)
I spoke with Alan Schmidt and informed him that his most recent A1c was 8.8 and that Dr Myer Haff said it will need to be 7.5 or less to consider surgery. I encouraged him to keep working with his PCP and endocrinologist and to contact us when he is at his goal and we will be happy to move forward with surgery.

## 2022-11-23 NOTE — Telephone Encounter (Signed)
He had his A1c drawn a few weeks ago with [facility]. Do you have the results? He wants to move forward with surgery.

## 2022-11-26 ENCOUNTER — Other Ambulatory Visit: Payer: 59

## 2022-12-04 ENCOUNTER — Telehealth: Payer: Self-pay | Admitting: Neurosurgery

## 2022-12-04 DIAGNOSIS — Z794 Long term (current) use of insulin: Secondary | ICD-10-CM
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
## 2022-12-08 ENCOUNTER — Encounter (INDEPENDENT_AMBULATORY_CARE_PROVIDER_SITE_OTHER): Payer: 59 | Admitting: Vascular Surgery

## 2022-12-09 DIAGNOSIS — Z794 Long term (current) use of insulin: Secondary | ICD-10-CM | POA: Diagnosis not present

## 2022-12-09 DIAGNOSIS — E118 Type 2 diabetes mellitus with unspecified complications: Secondary | ICD-10-CM | POA: Diagnosis not present

## 2022-12-09 DIAGNOSIS — E1165 Type 2 diabetes mellitus with hyperglycemia: Secondary | ICD-10-CM | POA: Diagnosis not present

## 2022-12-09 NOTE — Telephone Encounter (Signed)
I spoke with Alan Schmidt and relayed Dr Lucienne Capers response.   I reviewed Dr Lucienne Capers surgical plan as discussed in his last office note and we discussed post-op restrictions at length. I also informed him that he is welcome to come in for another appointment with Dr Myer Haff to review this again if he would like to do so.  He is aware of the need for an A1c of 7.5% or less. He states he has been trying to watch his blood sugar, but usually has an "off day" about once a week.  I placed an order for the fructosamine test, and I explained that he can have this done sooner than the A1c test if he is being diligent about his blood sugar control.  He was very appreciative of the phone call.

## 2022-12-11 ENCOUNTER — Other Ambulatory Visit: Payer: 59

## 2022-12-14 ENCOUNTER — Telehealth: Payer: Self-pay | Admitting: Podiatry

## 2022-12-14 NOTE — Telephone Encounter (Signed)
Pt called and left message on 11/10 stating he needed you to work him in on Tuesday for an appt to puds as he cannot wait. He is a [place] pt and needs his shoes now.he is scheduled 12[place]27 [place].

## 2022-12-19 ENCOUNTER — Other Ambulatory Visit: Payer: Self-pay | Admitting: Family Medicine

## 2022-12-21 NOTE — Telephone Encounter (Signed)
This patient is due for follow-up.  Please get him scheduled and then I will refill his Xanax to cover until his appointment.

## 2022-12-22 ENCOUNTER — Other Ambulatory Visit: Payer: Self-pay | Admitting: Family Medicine

## 2022-12-22 DIAGNOSIS — Z794 Long term (current) use of insulin: Secondary | ICD-10-CM

## 2022-12-22 DIAGNOSIS — I10 Essential (primary) hypertension: Secondary | ICD-10-CM

## 2022-12-22 NOTE — Telephone Encounter (Signed)
Error

## 2022-12-22 NOTE — Telephone Encounter (Signed)
Lvm for pt to give office a call back to schedule a f/up with Dr. Birdie Sons

## 2022-12-24 ENCOUNTER — Other Ambulatory Visit: Payer: Self-pay | Admitting: Family Medicine

## 2022-12-24 DIAGNOSIS — F419 Anxiety disorder, unspecified: Secondary | ICD-10-CM

## 2022-12-24 DIAGNOSIS — E1142 Type 2 diabetes mellitus with diabetic polyneuropathy: Secondary | ICD-10-CM

## 2022-12-28 ENCOUNTER — Ambulatory Visit: Payer: 59 | Attending: Pain Medicine | Admitting: Pain Medicine

## 2022-12-28 ENCOUNTER — Encounter: Payer: Self-pay | Admitting: Family Medicine

## 2022-12-28 ENCOUNTER — Encounter: Payer: Self-pay | Admitting: Pain Medicine

## 2022-12-28 ENCOUNTER — Ambulatory Visit (INDEPENDENT_AMBULATORY_CARE_PROVIDER_SITE_OTHER): Payer: 59 | Admitting: Family Medicine

## 2022-12-28 VITALS — BP 150/80 | HR 69 | Temp 98.1°F | Ht 72.0 in | Wt 255.0 lb

## 2022-12-28 VITALS — BP 164/88 | HR 84 | Temp 97.4°F | Resp 16 | Ht 72.0 in | Wt 255.0 lb

## 2022-12-28 DIAGNOSIS — Z79891 Long term (current) use of opiate analgesic: Secondary | ICD-10-CM | POA: Diagnosis not present

## 2022-12-28 DIAGNOSIS — M47816 Spondylosis without myelopathy or radiculopathy, lumbar region: Secondary | ICD-10-CM

## 2022-12-28 DIAGNOSIS — M5030 Other cervical disc degeneration, unspecified cervical region: Secondary | ICD-10-CM

## 2022-12-28 DIAGNOSIS — M25562 Pain in left knee: Secondary | ICD-10-CM | POA: Diagnosis not present

## 2022-12-28 DIAGNOSIS — F32A Depression, unspecified: Secondary | ICD-10-CM

## 2022-12-28 DIAGNOSIS — M545 Low back pain, unspecified: Secondary | ICD-10-CM | POA: Diagnosis not present

## 2022-12-28 DIAGNOSIS — F419 Anxiety disorder, unspecified: Secondary | ICD-10-CM | POA: Diagnosis not present

## 2022-12-28 DIAGNOSIS — M25561 Pain in right knee: Secondary | ICD-10-CM | POA: Diagnosis not present

## 2022-12-28 DIAGNOSIS — G894 Chronic pain syndrome: Secondary | ICD-10-CM

## 2022-12-28 DIAGNOSIS — Z96641 Presence of right artificial hip joint: Secondary | ICD-10-CM | POA: Insufficient documentation

## 2022-12-28 DIAGNOSIS — M79604 Pain in right leg: Secondary | ICD-10-CM | POA: Insufficient documentation

## 2022-12-28 DIAGNOSIS — Z794 Long term (current) use of insulin: Secondary | ICD-10-CM | POA: Diagnosis not present

## 2022-12-28 DIAGNOSIS — E1142 Type 2 diabetes mellitus with diabetic polyneuropathy: Secondary | ICD-10-CM | POA: Diagnosis not present

## 2022-12-28 DIAGNOSIS — M542 Cervicalgia: Secondary | ICD-10-CM | POA: Insufficient documentation

## 2022-12-28 DIAGNOSIS — Z79899 Other long term (current) drug therapy: Secondary | ICD-10-CM

## 2022-12-28 DIAGNOSIS — M25551 Pain in right hip: Secondary | ICD-10-CM | POA: Insufficient documentation

## 2022-12-28 DIAGNOSIS — G8929 Other chronic pain: Secondary | ICD-10-CM | POA: Diagnosis not present

## 2022-12-28 DIAGNOSIS — M51362 Other intervertebral disc degeneration, lumbar region with discogenic back pain and lower extremity pain: Secondary | ICD-10-CM

## 2022-12-28 DIAGNOSIS — Z7984 Long term (current) use of oral hypoglycemic drugs: Secondary | ICD-10-CM | POA: Diagnosis not present

## 2022-12-28 DIAGNOSIS — M79605 Pain in left leg: Secondary | ICD-10-CM | POA: Insufficient documentation

## 2022-12-28 DIAGNOSIS — I10 Essential (primary) hypertension: Secondary | ICD-10-CM

## 2022-12-28 MED ORDER — MORPHINE SULFATE ER 15 MG PO TBCR: 15.0000 mg | EXTENDED_RELEASE_TABLET | Freq: Three times a day (TID) | ORAL | 0 refills | Status: DC

## 2022-12-28 MED ORDER — NALOXONE HCL 4 MG/0.1ML NA LIQD: 1.0000 | NASAL | 0 refills | Status: DC | PRN

## 2022-12-28 NOTE — Patient Instructions (Signed)

## 2022-12-28 NOTE — Assessment & Plan Note (Signed)
Chronic issue.  Blood pressure had been well-controlled previously.  Blood pressure went up today on recheck after we discussed me leaving the clinic.  Given that it had been adequately controlled previously we will see what his blood pressure is doing at follow-up.

## 2022-12-28 NOTE — Assessment & Plan Note (Signed)
Chronic issue.  Suboptimally controlled.  Will refer to psychiatry for further help in managing this issue.  Patient will continue Lexapro 20 mg daily, Wellbutrin 300 mg daily, and Xanax half a milligram twice daily as needed for anxiety.

## 2022-12-28 NOTE — Progress Notes (Signed)
Nursing Pain Medication Assessment:  Safety precautions to be maintained throughout the outpatient stay will include: orient to surroundings, keep bed in low position, maintain call bell within reach at all times, provide assistance with transfer out of bed and ambulation.  Medication Inspection Compliance: Pill count conducted under aseptic conditions, in front of the patient. Neither the pills nor the bottle was removed from the patient's sight at any time. Once count was completed pills were immediately returned to the patient in their original bottle.  Medication: Morphine IR Pill/Patch Count:  13 of 90 pills remain Pill/Patch Appearance: Markings consistent with prescribed medication Bottle Appearance: Standard pharmacy container. Clearly labeled. Filled Date: 10 / 04 / 2024 Last Medication intake:  Today

## 2022-12-28 NOTE — Progress Notes (Signed)
Alan Alar, MD Phone: [phone]  Alan Schmidt is a 77 y.o. male who presents today for follow-up.  Anxiety/depression: Patient notes anxiety has worsened quite a bit recently.  He has had some depression.  He is on Xanax.  He is also on Wellbutrin and Lexapro.  No SI.  He has had issues at home with a home aide bringing in bedbugs.  He has just recently gotten this issue taken care of.  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  He notes all of this has been stressful.  Diabetes: Patient notes his sugars were doing well until a couple weeks ago.  He had a death in the family and he has been eating some more unhealthy things.  He has been on Jardiance, Humalog, metformin, Mounjaro, and Tresiba.  He does note he had a month where he was 100% in range on his CGM.  He is seeing endocrinology and notes he sees them in the next week or so.  A1c 1 month ago was 8.8.  Social History   Tobacco Use  Smoking Status Former   Types: Cigarettes  Smokeless Tobacco Never    Current Outpatient Medications on File Prior to Visit  Medication Sig Dispense Refill   ACCU-CHEK GUIDE test strip USE AS INSTRUCTED DX CODE: E11.9 100 strip 5   acetaminophen (TYLENOL) 500 MG tablet Take 500 mg by mouth every 6 (six) hours as needed.     ALPRAZolam (XANAX) 1 MG tablet TAKE 1/2 TABLET BY MOUTH 2 TIMES DAILY AS NEEDED FOR ANXIETY. 30 tablet 0   aspirin EC 81 MG tablet Take 81 mg by mouth daily.     buPROPion (WELLBUTRIN XL) 150 MG 24 hr tablet TAKE 1 TABLET (150 MG TOTAL) BY MOUTH DAILY FOR 7 DAYS, THEN 2 TABLETS (300 MG TOTAL) DAILY. 180 tablet 0   carvedilol (COREG) 25 MG tablet Take 1 tablet by mouth twice daily. 180 tablet 1   Continuous Blood Gluc Receiver (FREESTYLE LIBRE 14 DAY READER) DEVI 1 Device by Does not apply route daily. Use to scan to check blood sugar up to 7 times daily; E11.42, 1 Device 1   Continuous Glucose Sensor (FREESTYLE LIBRE 14 DAY SENSOR) MISC 1 Device by  Does not apply route every 14 (fourteen) days. E11.42 6 each 3   desoximetasone (TOPICORT) 0.25 % cream APPLY CREAM TO AFFECTED AREA TWO TIMES DAILY, FOR UP TO 7 DAYS, DO NOT APPLY TO FACE   Strength: 0.25 % 30 g 0   empagliflozin (JARDIANCE) 25 MG TABS tablet Take 1 tablet (25 mg total) by mouth daily. 90 tablet 0   EPINEPHrine (EPIPEN 2-PAK) 0.3 mg/0.3 mL IJ SOAJ injection Inject 0.3 mg into the muscle as needed for anaphylaxis. 2 each 0   escitalopram (LEXAPRO) 20 MG tablet Take 1 tablet by mouth daily. 90 tablet 0   gabapentin (NEURONTIN) 250 MG/5ML solution Take 6 ml by mouth at bedtime. 180 mL 1   hydrochlorothiazide (HYDRODIURIL) 25 MG tablet Take 1 tablet by mouth daily. 90 tablet 0   insulin degludec (TRESIBA FLEXTOUCH) 100 UNIT/ML FlexTouch Pen INJECT 13 UNITS UNDER THE SKIN DAILY 15 mL 1   insulin lispro (HUMALOG KWIKPEN) 100 UNIT/ML KwikPen INJECT 5 TO 7 UNITS UNDER THE SKIN THREE TIMES DAILY WITH MEALS 15 mL 1   Insulin Pen Needle (BD PEN NEEDLE NANO U/F) 32G X 4 MM MISC USE EVERY DAY 100 each 4   loratadine (CLARITIN) 10 MG tablet Take  1 tablet by mouth daily. 30 tablet 10   metFORMIN (GLUCOPHAGE) 1000 MG tablet Take 1 tablet by mouth twice daily with a meal. 180 tablet 0   methocarbamol (ROBAXIN) 500 MG tablet Take 500 mg by mouth 2 (two) times daily.     mometasone (NASONEX) 50 MCG/ACT nasal spray USE 2 SPRAYS INTO EACH NOSTRIL ONCE DAILY 34 each 1   morphine (MS CONTIN) 15 MG 12 hr tablet Take 1 tablet (15 mg total) by mouth every 8 (eight) hours. Must last 30 days. Do not break tablet 90 tablet 0   olmesartan (BENICAR) 40 MG tablet Take 1 tablet by mouth daily. 90 tablet 0   pantoprazole (PROTONIX) 40 MG tablet Take 1 tablet by mouth daily. 90 tablet 0   rosuvastatin (CRESTOR) 40 MG tablet Take 1 tablet by mouth daily. 90 tablet 3   sennosides-docusate sodium (SENOKOT-S) 8.6-50 MG tablet Take 1 tablet by mouth daily as needed.      tamsulosin (FLOMAX) 0.4 MG CAPS capsule Take 1  capsule by mouth twice daily. 180 capsule 0   tirzepatide (MOUNJARO) 7.5 MG/0.5ML Pen Inject 7.5 mg into the skin once a week. 6 mL 2   lubiprostone (AMITIZA) 8 MCG capsule TAKE 1 CAPSULE (8 MCG TOTAL) BY MOUTH 2 (TWO) TIMES DAILY WITH A MEAL. SWALLOW THE MEDICATION WHOLE. DO NOT BREAK OR CHEW THE MEDICATION. 180 capsule 1   Wheat Dextrin (BENEFIBER) POWD Take 6 g by mouth 3 (three) times daily before meals. (2 tsp = 6 g) 730 g 2   No current facility-administered medications on file prior to visit.     ROS see history of present illness  Objective  Physical Exam Vitals:   12/28/22 0828 12/28/22 0856  BP: 138/82 (!) 150/80  Pulse: 69   Temp: 98.1 F (36.7 C)   SpO2: 96%     BP Readings from Last 3 Encounters:  12/28/22 (!) 150/80  10/29/22 114/70  10/07/22 (!) 154/86   Wt Readings from Last 3 Encounters:  12/28/22 255 lb (115.7 kg)  10/29/22 256 lb (116.1 kg)  10/07/22 264 lb (119.7 kg)    Physical Exam Constitutional:      General: He is not in acute distress.    Appearance: He is not diaphoretic.  Cardiovascular:     Rate and Rhythm: Normal rate and regular rhythm.     Heart sounds: Normal heart sounds.  Pulmonary:     Effort: Pulmonary effort is normal.     Breath sounds: Normal breath sounds.  Skin:    General: Skin is warm and dry.  Neurological:     Mental Status: He is alert.      Assessment/Plan: Please see individual problem list.  Anxiety and depression Assessment & Plan: Chronic issue.  Suboptimally controlled.  Will refer to psychiatry for further help in managing this issue.  Patient will continue Lexapro 20 mg daily, Wellbutrin 300 mg daily, and Xanax half a milligram twice daily as needed for anxiety.  Orders: -     Ambulatory referral to Psychiatry  Type 2 diabetes mellitus with diabetic polyneuropathy, with long-term current use of insulin (HCC) Assessment & Plan: Chronic issue.  Improving control.  He will continue metformin 1000 mg  twice daily, Mounjaro 7.5 mg weekly, Tresiba 13 units daily, Jardiance 25 mg daily, and Humalog once daily with his largest meal.  He will see endocrinology as planned.   Essential hypertension Assessment & Plan: Chronic issue.  Blood pressure had been well-controlled previously.  Blood pressure went up today on recheck after we discussed me leaving the clinic.  Given that it had been adequately controlled previously we will see what his blood pressure is doing at follow-up.     Return in about 3 months (around 03/30/2023) for transfer of care.   Alan Alar, MD [facility]

## 2022-12-28 NOTE — Assessment & Plan Note (Signed)
Chronic issue.  Improving control.  He will continue metformin 1000 mg twice daily, Mounjaro 7.5 mg weekly, Tresiba 13 units daily, Jardiance 25 mg daily, and Humalog once daily with his largest meal.  He will see endocrinology as planned.

## 2022-12-28 NOTE — Progress Notes (Signed)
PROVIDER NOTE: Information contained herein reflects review and annotations entered in association with encounter. Interpretation of such information and data should be left to medically-trained personnel. Information provided to patient can be located elsewhere in the medical record under "Patient Instructions". Document created using STT-dictation technology, any transcriptional errors that may result from process are unintentional.    Patient: Alan Schmidt  Service Category: E/M  Provider: Oswaldo Done, MD  DOB: 10/01/1945  DOS: 12/28/2022  Referring Provider: Glori Luis, MD  MRN: [id number]  Specialty: Interventional Pain Management  PCP: Glori Luis, MD  Type: Established Patient  Setting: Ambulatory outpatient    Location: Office  Delivery: Face-to-face     HPI  [name], a 77 y.o. year old male, is here today because of his No primary diagnosis found.. Alan Schmidt primary complain today is Back Pain (lower) and Hip Pain (right)  Pertinent problems: Alan Schmidt has Chronic low back pain (Bilateral) (R>L) w/ sciatica (Bilateral); Lumbar facet syndrome (Bilateral) (R>L); Lumbar spondylosis; Diabetic peripheral neuropathy (HCC); Chronic neck pain (midline over the C7 spinous processes) (L>R); Neurogenic pain; Neuropathic pain; Chronic lower extremity pain (2ry area of Pain) (Bilateral) (R>L); 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) (Right L5 dermatome); History of total hip replacement (Right); Chronic hip pain (Right); Abnormal nerve conduction studies (severe bilateral lower extremity polyneuropathy); Chronic shoulder pain (Bilateral) (R>L); Occipital neuralgia (Left); Cervicogenic headache (Left); Chronic shoulder radicular pain (Left); Chronic cervical radicular pain (Left); Chronic pain syndrome; Lumbar facet joint osteoarthritis (Bilateral); Chronic headache; Chronic tension-type headache, intractable; Coccygodynia; DDD (degenerative disc disease),  lumbar; Chronic musculoskeletal pain; Chronic knee pain (Bilateral); Thumb pain (Right); 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); Spondylosis without myelopathy or radiculopathy, lumbosacral region; Osteoarthritis involving multiple joints; Shortened hamstring muscle; Chronic low back pain (1ry area of Pain) (Bilateral) (R>L) w/o sciatica; DDD (degenerative disc disease), cervical; Bilateral lower extremity edema; Bilateral leg weakness; Musculoskeletal chest pain; Abnormal MRI, lumbar spine (06/02/2022); Trigger point of shoulder region (Left); Musculoskeletal disorder involving upper trapezius muscle (Left); Left thigh pain; Chronic hip pain (Left); Impaired range of motion of hip (Left); Physical deconditioning; and Spinal stenosis, lumbar region, 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 Pain Assessment: Severity of Chronic pain is reported as a 3 /10. Location: Back Lower/denies. Onset: More than a month ago. Quality: Throbbing. Timing: Constant. Modifying factor(s): Morphine, Ibuprofen. Vitals:  height is 6' (1.829 m) and weight is 255 lb (115.7 kg). His temporal temperature is 97.4 F (36.3 C) (abnormal). His blood pressure is 164/88 (abnormal) and his pulse is 84. His respiration is 16 and oxygen saturation is 96%.  BMI: Estimated body mass index is 34.58 kg/m as calculated from the following:   Height as of this encounter: 6' (1.829 m).   Weight as of this encounter: 255 lb (115.7 kg). Last encounter: 10/26/2022. Last procedure: 04/14/2022.  Reason for encounter: medication management.  The patient indicates doing well with the current medication regimen. No adverse reactions or side effects reported to the medications.  Discussed the use of AI scribe software for clinical note transcription with the patient, who gave verbal consent to proceed.  History of Present Illness   The patient, with a history of chronic pain managed with  medication, reports no adverse reactions to their current pain regimen, but notes it is less effective than previous treatments. They deny any changes in their pharmacy or medication regimen.  The patient is considering surgery for  a herniated disc, as recommended by Dr. Myer Haff. They express uncertainty about the recovery process and post-operative restrictions, as well as concerns about managing post-operative care due to living alone. They have not yet scheduled the surgery.  The patient also mentions a desire to reduce their dependence on pain medication post-surgery, once they have recovered and regained strength. They express an understanding that this process will take time and will require medical guidance to avoid withdrawal symptoms.  The patient's current pain medication prescription is sufficient to last until late February, covering the potential period of surgery and initial recovery. They have one remaining prescription at their pharmacy, in addition to the three-month refill provided during the consultation.     RTCB:  04/05/2023   Pharmacotherapy Assessment  Analgesic: Morphine ER (MS Contin) 30 mg, 1 tab p.o. twice daily (60 mg/day).  (Previously on oxycodone IR 10 mg every 6 hours (40 mg/day)) MME/day: 60 mg/day.   Monitoring: [place] PMP: PDMP reviewed during this encounter.       Pharmacotherapy: No side-effects or adverse reactions reported. Compliance: No problems identified. Effectiveness: Clinically acceptable.  Concepcion Elk, RN  12/28/2022  2:16 PM  Sign when Signing Visit Nursing Pain Medication Assessment:  Safety precautions to be maintained throughout the outpatient stay will include: orient to surroundings, keep bed in low position, maintain call bell within reach at all times, provide assistance with transfer out of bed and ambulation.  Medication Inspection Compliance: Pill count conducted under aseptic conditions, in front of the patient. Neither the pills nor  the bottle was removed from the patient's sight at any time. Once count was completed pills were immediately returned to the patient in their original bottle.  Medication: Morphine IR Pill/Patch Count:  13 of 90 pills remain Pill/Patch Appearance: Markings consistent with prescribed medication Bottle Appearance: Standard pharmacy container. Clearly labeled. Filled Date: 10 / 04 / 2024 Last Medication intake:  Today    No results found for: "CBDTHCR" No results found for: "D8THCCBX" No results found for: "D9THCCBX"  UDS:  Summary  Date Value Ref Range Status  07/08/2022 Note  Final    Comment:    ==================================================================== [employer] Select 13 (MW) ==================================================================== Test                             Result       Flag       Units  Drug Present and Declared for Prescription Verification   Alprazolam                     58           EXPECTED   ng/mg creat   Alpha-hydroxyalprazolam        142          EXPECTED   ng/mg creat    Source of alprazolam is a scheduled prescription medication. Alpha-    hydroxyalprazolam is an expected metabolite of alprazolam.    Morphine                       15430        EXPECTED   ng/mg creat   Normorphine                    570          EXPECTED   ng/mg creat    Potential sources of large amounts of morphine  in the absence of    codeine include administration of morphine or use of heroin.     Normorphine is an expected metabolite of morphine.    Hydromorphone                  362          EXPECTED   ng/mg creat    Hydromorphone may be present as a metabolite of morphine;    concentrations of hydromorphone rarely exceed 5% of the morphine    concentration when this is the source of hydromorphone.  ==================================================================== Test                      Result    Flag   Units      Ref Range   Creatinine              50                mg/dL      >=16 ==================================================================== Declared Medications:  The flagging and interpretation on this report are based on the  following declared medications.  Unexpected results may arise from  inaccuracies in the declared medications.   **Note: The testing scope of this panel includes these medications:   Alprazolam  Morphine (MS Contin)   **Note: The testing scope of this panel does not include the  following reported medications:   Acetaminophen (Tylenol)  Aspirin  Bupropion (Wellbutrin XL)  Carvedilol (Coreg)  Desoximetasone (Topicort)  Empagliflozin (Jardiance)  Epinephrine (EpiPen)  Escitalopram (Lexapro)  Gabapentin (Neurontin)  Hydrochlorothiazide  Insulin Evaristo Bury)  Loratadine (Claritin)  Lubiprostone (Amitiza)  Metformin  Methocarbamol  Mometasone (Nasonex)  Naloxone (Narcan)  Olmesartan (Benicar)  Pantoprazole (Protonix)  Rosuvastatin (Crestor)  Supplement (Fiber)  Tamsulosin (Flomax)  Tirzepatide (Mounjaro) ==================================================================== For clinical consultation, please call [phone]. ====================================================================       ROS  Constitutional: Denies any fever or chills Gastrointestinal: No reported hemesis, hematochezia, vomiting, or acute GI distress Musculoskeletal: Denies any acute onset joint swelling, redness, loss of ROM, or weakness Neurological: No reported episodes of acute onset apraxia, aphasia, dysarthria, agnosia, amnesia, paralysis, loss of coordination, or loss of consciousness  Medication Review  ALPRAZolam, Benefiber, EPINEPHrine, FreeStyle Libre 14 Day Reader, FreeStyle Libre 14 Day Sensor, Insulin Pen Needle, acetaminophen, aspirin EC, buPROPion, carvedilol, desoximetasone, empagliflozin, escitalopram, gabapentin, glucose blood, hydrochlorothiazide, insulin degludec, insulin lispro, loratadine,  lubiprostone, metFORMIN, methocarbamol, mometasone, morphine, naloxone, olmesartan, pantoprazole, rosuvastatin, sennosides-docusate sodium, tamsulosin, and tirzepatide  History Review  Allergy: Alan Schmidt is allergic to contrast media [iodinated contrast media], iodine, and shellfish allergy. Drug: Alan Schmidt  reports no history of drug use. Alcohol:  reports no history of alcohol use. Tobacco:  reports that he has quit smoking. His smoking use included cigarettes. He has never used smokeless tobacco. Social: Alan Schmidt  reports that he has quit smoking. His smoking use included cigarettes. He has never used smokeless tobacco. He reports that he does not drink alcohol and does not use drugs. Medical:  has a past medical history of Acute postoperative pain (11/24/2016), Anxiety, Chronic hip pain (Right) (12/05/2014), Chronic lumbar pain, Depression, Diabetes mellitus without complication (HCC), Hyperlipidemia, Hypertension, Kidney stones, and Migraines. Surgical: Alan Schmidt  has a past surgical history that includes Total hip arthroplasty; Tonsillectomy; and right hip surgery. Family: family history includes Cancer in his mother; Diabetes in his father, sister, and sister; Heart disease in his father; Stroke in his father.  Laboratory Chemistry Profile  Renal Lab Results  Component Value Date   BUN 8 09/22/2022   CREATININE 0.85 09/22/2022   BCR NOT APPLICABLE 04/25/2021   GFR 84.35 09/22/2022   GFRAA >60 03/26/2018   GFRNONAA >60 03/26/2018    Hepatic Lab Results  Component Value Date   AST 21 09/22/2022   ALT 20 09/22/2022   ALBUMIN 3.9 09/22/2022   ALKPHOS 83 09/22/2022    Electrolytes Lab Results  Component Value Date   NA 140 09/22/2022   K 3.8 09/22/2022   CL 99 09/22/2022   CALCIUM 9.2 09/22/2022   MG 1.8 03/06/2015    Bone No results found for: "VD25OH", "VD125OH2TOT", "WU9811BJ4", "NW2956OZ3", "25OHVITD1", "25OHVITD2", "25OHVITD3", "TESTOFREE", "TESTOSTERONE"   Inflammation (CRP: Acute Phase) (ESR: Chronic Phase) Lab Results  Component Value Date   CRP 0.5 03/06/2015   ESRSEDRATE 45 (H) 12/14/2016         Note: Above Lab results reviewed.  Recent Imaging Review  DG HIP UNILAT W OR W/O PELVIS 2-3 VIEWS LEFT CLINICAL DATA:  Left hip pain, left thigh pain  EXAM: DG HIP (WITH OR WITHOUT PELVIS) 2-3V LEFT  COMPARISON:  None Available.  FINDINGS: No acute fracture or dislocation. No aggressive osseous lesion. Normal alignment. Generalized osteopenia. Right total hip arthroplasty without hardware failure or complication. Mild osteoarthritis of the left hip. Osseous prominence of the superior femoral head-neck junction.  Soft tissue are unremarkable. No radiopaque foreign body or soft tissue emphysema.  IMPRESSION: 1. Right total hip arthroplasty. 2. Mild osteoarthritis of the left hip.  Electronically Signed   By: Elige Ko M.D.   On: 10/24/2022 15:00 Note: Reviewed        Physical Exam  General appearance: Well nourished, well developed, and well hydrated. In no apparent acute distress Mental status: Alert, oriented x 3 (person, place, & time)       Respiratory: No evidence of acute respiratory distress Eyes: PERLA Vitals: BP (!) 164/88   Pulse 84   Temp (!) 97.4 F (36.3 C) (Temporal)   Resp 16   Ht 6' (1.829 m)   Wt 255 lb (115.7 kg)   SpO2 96%   BMI 34.58 kg/m  BMI: Estimated body mass index is 34.58 kg/m as calculated from the following:   Height as of this encounter: 6' (1.829 m).   Weight as of this encounter: 255 lb (115.7 kg). Ideal: Ideal body weight: 77.6 kg (171 lb 1.2 oz) Adjusted ideal body weight: 92.8 kg (204 lb 10.3 oz)  Assessment   Diagnosis Status  1. Chronic low back pain (1ry area of Pain) (Bilateral) w/o sciatica   2. Chronic lower extremity pain (2ry area of Pain) (Bilateral) (R>L)   3. DDD (degenerative disc disease), cervical   4. DDD (degenerative disc disease), lumbar   5. Chronic  neck pain (midline over the C7 spinous processes) (L>R)   6. Lumbar facet syndrome (Bilateral) (R>L)   7. Chronic knee pain (Bilateral)   8. 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 Chronic pain syndrome   10. Pharmacologic therapy   11. Chronic use of opiate for therapeutic purpose   12. Encounter for medication management   13. Encounter for chronic pain management    Controlled Controlled Controlled   Updated Problems: No problems updated.  Plan of Care  Problem-specific:  Assessment and Plan    Lumbar Herniated Disc Evaluated by a neurosurgeon for a herniated disc impinging on the spinal canal, they discussed the typical recovery  process, including the necessity of physical therapy and potential restrictions on bending and twisting for six weeks post-surgery. They were advised to discuss their concerns about recovery and lack of home support with the surgical team, emphasizing the neurosurgeon's cautious approach to recommending surgery. We will provide a handout for surgeons regarding pain management and advise them to communicate their lack of home support to the surgical team.  Chronic Pain Management They report no side effects from the current pain medication but feel it is less effective than the previous medication. The importance of not manipulating prescribed medication was emphasized, along with the potential for additional pain medication post-surgery. The need for proper pain management during the recovery period was discussed. We will send refills to the pharmacy for the next three months to ensure enough medication lasts until late February.  Follow-up We will schedule a follow-up appointment post-surgery.       Alan Schmidt has a current medication list which includes the following long-term medication(s): bupropion, carvedilol, escitalopram, gabapentin, hydrochlorothiazide, insulin lispro, loratadine, metformin, mometasone, [START  ON 02/04/2023] morphine, [START ON 03/06/2023] morphine, naloxone, olmesartan, pantoprazole, rosuvastatin, lubiprostone, [START ON 01/05/2023] morphine, and benefiber.  Pharmacotherapy (Medications Ordered): Meds ordered this encounter  Medications   morphine (MS CONTIN) 15 MG 12 hr tablet    Sig: Take 1 tablet (15 mg total) by mouth every 8 (eight) hours. Must last 30 days. Do not break tablet    Dispense:  90 tablet    Refill:  0    DO NOT: delete (not duplicate); no partial-fill (will deny script to complete), no refill request (F/U required). DISPENSE: 1 day early if closed on fill date. WARN: No CNS-depressants within 8 hrs of med.   morphine (MS CONTIN) 15 MG 12 hr tablet    Sig: Take 1 tablet (15 mg total) by mouth every 8 (eight) hours. Must last 30 days. Do not break tablet    Dispense:  90 tablet    Refill:  0    DO NOT: delete (not duplicate); no partial-fill (will deny script to complete), no refill request (F/U required). DISPENSE: 1 day early if closed on fill date. WARN: No CNS-depressants within 8 hrs of med.   morphine (MS CONTIN) 15 MG 12 hr tablet    Sig: Take 1 tablet (15 mg total) by mouth every 8 (eight) hours. Must last 30 days. Do not break tablet    Dispense:  90 tablet    Refill:  0    DO NOT: delete (not duplicate); no partial-fill (will deny script to complete), no refill request (F/U required). DISPENSE: 1 day early if closed on fill date. WARN: No CNS-depressants within 8 hrs of med.   naloxone (NARCAN) nasal spray 4 mg/0.1 mL    Sig: Place 1 spray into the nose as needed for up to 365 doses (for opioid-induced respiratory depresssion). In case of emergency (overdose), spray once into each nostril. If no response within 3 minutes, repeat application and call 911.    Dispense:  1 each    Refill:  0    Instruct patient in proper use of device.   Orders:  No orders of the defined types were placed in this encounter.  Follow-up plan:   Return in about 14 weeks  (around 04/05/2023) for Eval-day (M,W), (F2F), (MM).      Interventional Therapies  Risk Factors  Considerations:   Allergy: CONTRAST, IODINE, & shellfish  VASOVAGAL RXN BNZ Tx   MO (L-FCT 5"  needle to the hub)      NO MORE RFA - intraoperative noncompliance, unnecessarily increasing radiation exposure NOTE: NO MORE RFA procedures. Difficulty following intra-procedural instructions and commands, resulting in unnecessarily long exposure of staff to radiation.    Planned  Pending:   Therapeutic bilateral (L4-5, L5-S1) lumbar facet (L3-S1) MBB #4    Under consideration:   Diagnostic Left GONB    Completed:   Therapeutic bilateral lumbar facet (L2-S1) MBB x3 (12/16/21) (100/100/80/80)  Therapeutic left lumbar facet (L2-S1 MB) RFA x2 (05/09/2019) (90/90/77/>75)  Therapeutic right lumbar facet (L2-S1 MB) RFA x6 (12/14/2019) (100/100/70/70) (No intra-op compliance)  Diagnostic/therapeutic right small joint inj. (thumb) x1 (03/15/2018) (N/A)  Diagnostic/therapeutic left cervical ESI x1 (12/18/2015) (90/70/50/50)    Completed by other providers:   None at this time   Therapeutic  Palliative (PRN) options:   Palliative lumbar facet MBB  Therapeutic cervical ESI    Pharmacotherapy  Nonopioids transferred 12/14/2019: Gabapentin, Amitiza, Benefiber, Mobic       Recent Visits Date Type Provider Dept  10/26/22 Office Visit Delano Metz, MD [facility]  10/07/22 Office Visit Delano Metz, MD [facility]  Showing recent visits within past 90 days and meeting all other requirements Today's Visits Date Type Provider Dept  12/28/22 Office Visit Delano Metz, MD [facility]  Showing today's visits and meeting all other requirements Future Appointments No visits were found meeting these conditions. Showing future appointments within next 90 days and meeting all other requirements  I discussed the assessment and treatment plan with the  patient. The patient was provided an opportunity to ask questions and all were answered. The patient agreed with the plan and demonstrated an understanding of the instructions.  Patient advised to call back or seek an in-person evaluation if the symptoms or condition worsens.  Duration of encounter: 30 minutes.  Total time on encounter, as per AMA guidelines included both the face-to-face and non-face-to-face time personally spent by the physician and/or other qualified health care professional(s) on the day of the encounter (includes time in activities that require the physician or other qualified health care professional and does not include time in activities normally performed by clinical staff). Physician's time may include the following activities when performed: Preparing to see the patient (e.g., pre-charting review of records, searching for previously ordered imaging, lab work, and nerve conduction tests) Review of prior analgesic pharmacotherapies. Reviewing PMP Interpreting ordered tests (e.g., lab work, imaging, nerve conduction tests) Performing post-procedure evaluations, including interpretation of diagnostic procedures Obtaining and/or reviewing separately obtained history Performing a medically appropriate examination and/or evaluation Counseling and educating the patient/family/caregiver Ordering medications, tests, or procedures Referring and communicating with other health care professionals (when not separately reported) Documenting clinical information in the electronic or other health record Independently interpreting results (not separately reported) and communicating results to the patient/ family/caregiver Care coordination (not separately reported)  Note by: Oswaldo Done, MD Date: 12/28/2022; Time: 4:10 PM

## 2023-01-03 ENCOUNTER — Telehealth: Payer: Self-pay | Admitting: Family Medicine

## 2023-01-04 ENCOUNTER — Other Ambulatory Visit: Payer: Self-pay | Admitting: Family Medicine

## 2023-01-04 NOTE — Telephone Encounter (Addendum)
The patient is out of his Alprazolam .   Prescription Request  01/04/2023  LOV: 12/28/2022  What is the name of the medication or equipment? escitalopram (LEXAPRO) 20 MG table         methocarbamol (ROBAXIN) 500 MG tablet           Have you contacted your pharmacy to request a refill? No   Which pharmacy would you like this sent to?  [id number] [address] [place] [address] Phone: [phone] Fax: [phone]     Patient notified that their request is being sent to the clinical staff for review and that they should receive a response within 2 business days.   Please advise at [facility] [phone]

## 2023-01-05 NOTE — Telephone Encounter (Signed)
Please contact the patient and see if he is still taking this.  Please find out when he last took this.  Please find out why he is now requesting this medication.  Thanks.

## 2023-01-05 NOTE — Telephone Encounter (Signed)
Historical medication last filled 2 years ago.  Last OV: 12/28/2022 Next OV: not scheduled

## 2023-01-07 NOTE — Telephone Encounter (Signed)
Noted. Plan to discuss further at scheduled visit prior to refilling.

## 2023-01-08 DIAGNOSIS — E1165 Type 2 diabetes mellitus with hyperglycemia: Secondary | ICD-10-CM | POA: Diagnosis not present

## 2023-01-08 DIAGNOSIS — Z794 Long term (current) use of insulin: Secondary | ICD-10-CM | POA: Diagnosis not present

## 2023-01-08 DIAGNOSIS — E118 Type 2 diabetes mellitus with unspecified complications: Secondary | ICD-10-CM | POA: Diagnosis not present

## 2023-01-11 ENCOUNTER — Telehealth: Payer: Self-pay

## 2023-01-11 ENCOUNTER — Telehealth: Payer: Self-pay | Admitting: Family Medicine

## 2023-01-11 DIAGNOSIS — H43811 Vitreous degeneration, right eye: Secondary | ICD-10-CM | POA: Diagnosis not present

## 2023-01-11 DIAGNOSIS — E113293 Type 2 diabetes mellitus with mild nonproliferative diabetic retinopathy without macular edema, bilateral: Secondary | ICD-10-CM | POA: Diagnosis not present

## 2023-01-11 DIAGNOSIS — Z961 Presence of intraocular lens: Secondary | ICD-10-CM | POA: Diagnosis not present

## 2023-01-11 DIAGNOSIS — H43391 Other vitreous opacities, right eye: Secondary | ICD-10-CM | POA: Diagnosis not present

## 2023-01-11 LAB — HM DIABETES EYE EXAM

## 2023-01-11 NOTE — Telephone Encounter (Signed)
Copied from CRM [id number]. Topic: Medicare AWV >> Jan 11, 2023  3:03 PM Payton Doughty wrote: Reason for CRM: LVM 01/11/23 to r/s AWVS due to [facility] sched change. [facility] New AWV appt 02/09/22 at 2:20pm. Please confirm appt date change [facility]; Care Guide Ambulatory Clinical Support [place] [facility] Direct Dial: [phone]

## 2023-01-11 NOTE — Telephone Encounter (Signed)
Patient states he recently saw Dr. [name] and he was going to refer him to someone for anxiety.  Patient states he has not heard from anyone and I do not see a referral in the system.

## 2023-01-11 NOTE — Telephone Encounter (Signed)
I've left a message for pt letting him know that the referral was entered during this appointment and that I was sending a message to our referral specialist.

## 2023-01-13 ENCOUNTER — Ambulatory Visit: Payer: 59 | Admitting: Family Medicine

## 2023-01-13 ENCOUNTER — Telehealth: Payer: Self-pay | Admitting: Family Medicine

## 2023-01-13 NOTE — Telephone Encounter (Signed)
Patient called and wanted to know the status of his referral for anxiety. He states provider was going to send in a referral but he has not heard anything. Please advise

## 2023-01-13 NOTE — Telephone Encounter (Signed)
Called pt and gave him the phone number for [facility] [phone]. Pt will try to reach out to the office.

## 2023-01-19 ENCOUNTER — Encounter: Payer: Self-pay | Admitting: Podiatry

## 2023-01-19 ENCOUNTER — Ambulatory Visit (INDEPENDENT_AMBULATORY_CARE_PROVIDER_SITE_OTHER): Payer: 59 | Admitting: Podiatry

## 2023-01-19 DIAGNOSIS — E119 Type 2 diabetes mellitus without complications: Secondary | ICD-10-CM | POA: Diagnosis not present

## 2023-01-19 DIAGNOSIS — M79674 Pain in right toe(s): Secondary | ICD-10-CM | POA: Diagnosis not present

## 2023-01-19 DIAGNOSIS — M79675 Pain in left toe(s): Secondary | ICD-10-CM

## 2023-01-19 DIAGNOSIS — B351 Tinea unguium: Secondary | ICD-10-CM | POA: Diagnosis not present

## 2023-01-19 NOTE — Progress Notes (Signed)
   Chief Complaint  Patient presents with   Diabetes    "I'm here to pick up a pair of shoes.  I need him to do the basic of cutting my toenails.  I cut my foot a little bit."    SUBJECTIVE Patient with a history of diabetes mellitus presents to office today complaining of elongated, thickened nails that cause pain while ambulating in shoes.  Patient is unable to trim their own nails. Patient is here for further evaluation and treatment.  Past Medical History:  Diagnosis Date   Acute postoperative pain 11/24/2016   Anxiety    Chronic hip pain (Right) 12/05/2014   Chronic lumbar pain    Depression    Diabetes mellitus without complication (HCC)    Hyperlipidemia    Hypertension    Kidney stones    Migraines     Allergies  Allergen Reactions   Contrast Media [Iodinated Contrast Media] Swelling   Iodine Swelling   Shellfish Allergy Nausea And Vomiting and Swelling     OBJECTIVE General Patient is awake, alert, and oriented x 3 and in no acute distress. Derm Skin is dry and supple bilateral. Negative open lesions or macerations. Remaining integument unremarkable. Nails are tender, long, thickened and dystrophic with subungual debris, consistent with onychomycosis, 1-5 bilateral. No signs of infection noted. Vasc  DP and PT pedal pulses palpable bilaterally. Temperature gradient within normal limits.  Neuro Epicritic and protective threshold sensation diminished bilaterally.  Musculoskeletal Exam No symptomatic pedal deformities noted bilateral. Muscular strength within normal limits.  ASSESSMENT 1. Diabetes Mellitus w/ peripheral neuropathy 2.  Pain due to onychomycosis of toenails bilateral 3.  Encounter for diabetic foot exam  PLAN OF CARE 1. Patient evaluated today.  Comprehensive diabetic foot exam performed today 2. Instructed to maintain good pedal hygiene and foot care. Stressed importance of controlling blood sugar.  3. Mechanical debridement of nails 1-5 bilaterally  performed using a nail nipper. Filed with dremel without incident.  4.  Diabetic shoes with custom molded Plastizote insoles pending.   5.  Return to clinic in 3 mos.     Felecia Shelling, DPM [facility]  Dr. Felecia Shelling, DPM    [address] [place], [place] [address]                Office [phone]  Fax [phone]

## 2023-01-21 ENCOUNTER — Other Ambulatory Visit: Payer: Self-pay | Admitting: Family Medicine

## 2023-01-21 DIAGNOSIS — K219 Gastro-esophageal reflux disease without esophagitis: Secondary | ICD-10-CM

## 2023-01-21 DIAGNOSIS — I10 Essential (primary) hypertension: Secondary | ICD-10-CM

## 2023-01-21 DIAGNOSIS — N401 Enlarged prostate with lower urinary tract symptoms: Secondary | ICD-10-CM

## 2023-01-22 ENCOUNTER — Ambulatory Visit: Payer: Self-pay | Admitting: Family Medicine

## 2023-01-22 ENCOUNTER — Encounter (INDEPENDENT_AMBULATORY_CARE_PROVIDER_SITE_OTHER): Payer: 59 | Admitting: Vascular Surgery

## 2023-01-22 NOTE — Telephone Encounter (Signed)
Copied from CRM [id number]. Topic: Clinical - Red Word Triage >> Jan 22, 2023  5:13 PM Corin V wrote: Red Word that prompted transfer to Nurse Triage: Pain in right arm and going to top of hand. It is impeding his ability to do some daily functions.   Chief Complaint: Right Arm Pain Symptoms: Elbow (R) and radiated down to your hand.  Frequency: Acute Pertinent Negatives: Patient denies numbness, weakness, or tingling. Denies any neurological deficit.  Disposition: [] ED /[] Urgent Care (no appt availability in office) / [x] Appointment(In office/virtual)/ []  [place] [facility]/ [] Home Care/ [] Refused Recommended Disposition /[] [place] [facility]/ []  Follow-up with PCP Additional Notes: H. Cerney is a 77 year old male being triaged for right arm pain localized to his elbow. The patient reports the pain radiating down to his hand. Denies any numbness or tingling, but has taken Morphine 15 mg ER and Advil, 200 mg for pain. He reports relief for this pain with this regimen. The patient is to be seen in office.    Reason for Disposition  [1] MODERATE pain (e.g., interferes with normal activities) AND [2] present > 3 days  Answer Assessment - Initial Assessment Questions 1. ONSET: "When did the pain start?"     Last night 2. LOCATION: "Where is the pain located?"     Right Arm, Below the elbow and radiates down to his hand.  3. PAIN: "How bad is the pain?" (Scale 1-10; or mild, moderate, severe)   - MILD (1-3): Doesn't interfere with normal activities.   - MODERATE (4-7): Interferes with normal activities (e.g., work or school) or awakens from sleep.   - SEVERE (8-10): Excruciating pain, unable to do any normal activities, unable to hold a cup of water.     7 4. WORK OR EXERCISE: "Has there been any recent work or exercise that involved this part of the body?"     No 5. CAUSE: "What do you think is causing the arm pain?"     Unsure 6. OTHER SYMPTOMS: "Do you have any other symptoms?"  (e.g., neck pain, swelling, rash, fever, numbness, weakness)     No other symptoms 7. PREGNANCY: "Is there any chance you are pregnant?" "When was your last menstrual period?"     N/A  Protocols used: Arm Pain-A-AH

## 2023-01-25 ENCOUNTER — Ambulatory Visit: Payer: 59 | Admitting: Family

## 2023-01-25 NOTE — Telephone Encounter (Signed)
Left message to call the office and schedule a visit for the arm pain per Dr. Birdie Sons.

## 2023-01-28 ENCOUNTER — Other Ambulatory Visit: Payer: Self-pay | Admitting: Family Medicine

## 2023-01-28 DIAGNOSIS — E1142 Type 2 diabetes mellitus with diabetic polyneuropathy: Secondary | ICD-10-CM

## 2023-01-29 ENCOUNTER — Telehealth: Payer: Self-pay

## 2023-01-29 ENCOUNTER — Other Ambulatory Visit: Payer: 59

## 2023-01-29 NOTE — Telephone Encounter (Signed)
Shoes and insert taken to [facility] appt 12/27

## 2023-01-29 NOTE — Telephone Encounter (Signed)
Shoes and insert are in [facility] appt 12/27

## 2023-02-02 ENCOUNTER — Ambulatory Visit: Payer: 59 | Admitting: Psychiatry

## 2023-02-05 ENCOUNTER — Ambulatory Visit (INDEPENDENT_AMBULATORY_CARE_PROVIDER_SITE_OTHER): Payer: 59 | Admitting: Nurse Practitioner

## 2023-02-05 ENCOUNTER — Encounter (INDEPENDENT_AMBULATORY_CARE_PROVIDER_SITE_OTHER): Payer: Self-pay | Admitting: Nurse Practitioner

## 2023-02-05 VITALS — BP 151/94 | HR 91 | Resp 16 | Wt 244.0 lb

## 2023-02-05 DIAGNOSIS — E119 Type 2 diabetes mellitus without complications: Secondary | ICD-10-CM | POA: Diagnosis not present

## 2023-02-05 DIAGNOSIS — I10 Essential (primary) hypertension: Secondary | ICD-10-CM

## 2023-02-05 DIAGNOSIS — Z794 Long term (current) use of insulin: Secondary | ICD-10-CM

## 2023-02-05 DIAGNOSIS — R600 Localized edema: Secondary | ICD-10-CM

## 2023-02-06 ENCOUNTER — Encounter (INDEPENDENT_AMBULATORY_CARE_PROVIDER_SITE_OTHER): Payer: Self-pay | Admitting: Nurse Practitioner

## 2023-02-07 ENCOUNTER — Other Ambulatory Visit: Payer: Self-pay | Admitting: Family Medicine

## 2023-02-07 DIAGNOSIS — E1165 Type 2 diabetes mellitus with hyperglycemia: Secondary | ICD-10-CM | POA: Diagnosis not present

## 2023-02-07 DIAGNOSIS — E118 Type 2 diabetes mellitus with unspecified complications: Secondary | ICD-10-CM | POA: Diagnosis not present

## 2023-02-07 DIAGNOSIS — Z794 Long term (current) use of insulin: Secondary | ICD-10-CM | POA: Diagnosis not present

## 2023-02-07 NOTE — Progress Notes (Signed)
 Subjective:    Patient ID: Alan Schmidt, male    DOB: 1945/05/01, 78 y.o.   MRN: [id number] Chief Complaint  Patient presents with   New Patient (Initial Visit)    Ref Maribeth consult venous insufficiency    Patient is seen for evaluation of leg swelling. The patient first noticed the swelling remotely but is now concerned because of a significant increase in the overall edema. The swelling isn't associated with significant pain.  There has been an increasing amount of  discoloration noted by the patient. The patient notes that in the morning the legs are improved but they steadily worsened throughout the course of the day. Elevation seems to make the swelling of the legs better, dependency makes them much worse.   There is no history of ulcerations associated with the swelling.   The patient denies any recent changes in their medications.  The patient has attempted to utilize graduated compression however it is difficult with his history of arthralgias in his hip and lower back issues.  The patient has no had any past angiography, interventions or vascular surgery.  The patient denies a history of DVT or PE. There is no prior history of phlebitis. There is no history of primary lymphedema.  There is no history of radiation treatment to the groin or pelvis No history of malignancies. No history of trauma or groin or pelvic surgery. No history of foreign travel or parasitic infections area      Review of Systems  Cardiovascular:  Positive for leg swelling.  Musculoskeletal:  Positive for arthralgias, back pain and gait problem.  All other systems reviewed and are negative.      Objective:   Physical Exam Vitals reviewed.  HENT:     Head: Normocephalic.  Cardiovascular:     Rate and Rhythm: Normal rate.     Pulses: Normal pulses.  Pulmonary:     Effort: Pulmonary effort is normal.  Skin:    General: Skin is warm and dry.  Neurological:     Mental Status: He is  alert and oriented to person, place, and time.  Psychiatric:        Mood and Affect: Mood normal.        Behavior: Behavior normal.        Thought Content: Thought content normal.        Judgment: Judgment normal.     BP (!) 151/94   Pulse 91   Resp 16   Wt 244 lb (110.7 kg)   BMI 33.09 kg/m   Past Medical History:  Diagnosis Date   Acute postoperative pain 11/24/2016   Anxiety    Chronic hip pain (Right) 12/05/2014   Chronic lumbar pain    Depression    Diabetes mellitus without complication (HCC)    Hyperlipidemia    Hypertension    Kidney stones    Migraines     Social History   Socioeconomic History   Marital status: Divorced    Spouse name: 1 Bio; 6 Adopted   Number of children: 7   Years of education: doctorate   Highest education level: Doctorate  Occupational History   Occupation: Retired  Tobacco Use   Smoking status: Former    Types: Cigarettes   Smokeless tobacco: Never  Vaping Use   Vaping status: Never Used  Substance and Sexual Activity   Alcohol use: No    Alcohol/week: 0.0 standard drinks of alcohol   Drug use: No   Sexual activity: Not  Currently  Other Topics Concern   Not on file  Social History Narrative   Per patient he has 1 biological child and 6 adopted children   Social Drivers of Corporate Investment Banker Strain: Low Risk  (02/06/2022)   Overall Financial Resource Strain (CARDIA)    Difficulty of Paying Living Expenses: Not very hard  Food Insecurity: No Food Insecurity (02/06/2022)   Hunger Vital Sign    Worried About Running Out of Food in the Last Year: Never true    Ran Out of Food in the Last Year: Never true  Transportation Needs: No Transportation Needs (02/06/2022)   PRAPARE - Administrator, Civil Service (Medical): No    Lack of Transportation (Non-Medical): No  Physical Activity: Unknown (02/06/2022)   Exercise Vital Sign    Days of Exercise per Week: 0 days    Minutes of Exercise per Session: Not on file   Recent Concern: Physical Activity - Insufficiently Active (02/06/2022)   Exercise Vital Sign    Days of Exercise per Week: 3 days    Minutes of Exercise per Session: 30 min  Stress: No Stress Concern Present (02/06/2022)   [employer] of Occupational Health - Occupational Stress Questionnaire    Feeling of Stress : Only a little  Social Connections: Moderately Integrated (02/06/2022)   Social Connection and Isolation Panel [NHANES]    Frequency of Communication with Friends and Family: More than three times a week    Frequency of Social Gatherings with Friends and Family: More than three times a week    Attends Religious Services: More than 4 times per year    Active Member of Clubs or Organizations: No    Attends Banker Meetings: 1 to 4 times per year    Marital Status: Divorced  Catering Manager Violence: Not At Risk (02/06/2022)   Humiliation, Afraid, Rape, and Kick questionnaire    Fear of Current or Ex-Partner: No    Emotionally Abused: No    Physically Abused: No    Sexually Abused: No    Past Surgical History:  Procedure Laterality Date   right hip surgery     4 surgeries   TONSILLECTOMY     TOTAL HIP ARTHROPLASTY      Family History  Problem Relation Age of Onset   Cancer Mother    Heart disease Father    Stroke Father    Diabetes Father    Diabetes Sister    Diabetes Sister     Allergies  Allergen Reactions   Contrast Media [Iodinated Contrast Media] Swelling   Iodine Swelling   Shellfish Allergy Nausea And Vomiting and Swelling       Latest Ref Rng & Units 09/22/2022    8:49 AM 04/25/2021    2:29 PM 11/17/2018    3:52 PM  CBC  WBC 4.0 - 10.5 K/uL 7.5  6.9  7.8   Hemoglobin 13.0 - 17.0 g/dL 86.5  85.4  85.1   Hematocrit 39.0 - 52.0 % 42.4  44.9  46.4   Platelets 150.0 - 400.0 K/uL 212.0  223  250.0       CMP     Component Value Date/Time   NA 140 09/22/2022 0849   NA 139 10/13/2013 1201   K 3.8 09/22/2022 0849   K 4.1 10/13/2013  1201   CL 99 09/22/2022 0849   CL 102 10/13/2013 1201   CO2 32 09/22/2022 0849   CO2 26 10/13/2013 1201  GLUCOSE 215 (H) 09/22/2022 0849   GLUCOSE 129 (H) 10/13/2013 1201   BUN 8 09/22/2022 0849   BUN 17 10/13/2013 1201   CREATININE 0.85 09/22/2022 0849   CREATININE 1.02 04/25/2021 1429   CALCIUM  9.2 09/22/2022 0849   CALCIUM  9.0 10/13/2013 1201   PROT 6.7 09/22/2022 0849   PROT 7.3 10/13/2013 1201   ALBUMIN 3.9 09/22/2022 0849   ALBUMIN 3.5 10/13/2013 1201   AST 21 09/22/2022 0849   AST 43 (H) 10/13/2013 1201   ALT 20 09/22/2022 0849   ALT 71 (H) 10/13/2013 1201   ALKPHOS 83 09/22/2022 0849   ALKPHOS 78 10/13/2013 1201   BILITOT 0.4 09/22/2022 0849   BILITOT 0.2 10/13/2013 1201   GFR 84.35 09/22/2022 0849   GFRNONAA >60 03/26/2018 1828   GFRNONAA >60 10/13/2013 1201     No results found.     Assessment & Plan:   1. Bilateral lower extremity edema (Primary) Recommend:  I have had a long discussion with the patient regarding swelling and why it  causes symptoms.  Patient will begin wearing graduated compression on a daily basis a prescription was given. The patient will  wear the stockings first thing in the morning and removing them in the evening. The patient is instructed specifically not to sleep in the stockings.   In addition, behavioral modification will be initiated.  This will include frequent elevation, use of over the counter pain medications and exercise such as walking.  Consideration for a lymph pump will also be made based upon the effectiveness of conservative therapy.  This would help to improve the edema control and prevent sequela such as ulcers and infections   Patient should undergo duplex ultrasound of the venous system to ensure that DVT or reflux is not present.  The patient will follow-up with me after the ultrasound.    In addition, the patient has been concerned that his swelling may be related to his heart.  This is certainly not  unreasonable.  We will refer the patient to cardiology for further evaluation. - Ambulatory referral to Cardiology  2. Essential hypertension Continue antihypertensive medications as already ordered, these medications have been reviewed and there are no changes at this time.  3. Type 2 diabetes mellitus treated with insulin  (HCC) Continue hypoglycemic medications as already ordered, these medications have been reviewed and there are no changes at this time.  Hgb A1C to be monitored as already arranged by primary service   Current Outpatient Medications on File Prior to Visit  Medication Sig Dispense Refill   ACCU-CHEK GUIDE test strip USE AS INSTRUCTED DX CODE: E11.9 100 strip 5   acetaminophen  (TYLENOL ) 500 MG tablet Take 500 mg by mouth every 6 (six) hours as needed.     ALPRAZolam  (XANAX ) 1 MG tablet TAKE 1/2 TABLET BY MOUTH 2 TIMES DAILY AS NEEDED FOR ANXIETY. 30 tablet 0   aspirin  EC 81 MG tablet Take 81 mg by mouth daily.     buPROPion  (WELLBUTRIN  XL) 150 MG 24 hr tablet TAKE 1 TABLET (150 MG TOTAL) BY MOUTH DAILY FOR 7 DAYS, THEN 2 TABLETS (300 MG TOTAL) DAILY. 180 tablet 0   carvedilol  (COREG ) 25 MG tablet Take 1 tablet by mouth twice daily. 180 tablet 1   Continuous Blood Gluc Receiver (FREESTYLE LIBRE 14 DAY READER) DEVI 1 Device by Does not apply route daily. Use to scan to check blood sugar up to 7 times daily; E11.42, 1 Device 1   Continuous Glucose Sensor (FREESTYLE  LIBRE 14 DAY SENSOR) MISC 1 Device by Does not apply route every 14 (fourteen) days. E11.42 6 each 3   desoximetasone  (TOPICORT ) 0.25 % cream APPLY CREAM TO AFFECTED AREA TWO TIMES DAILY, FOR UP TO 7 DAYS, DO NOT APPLY TO FACE   Strength: 0.25 % 30 g 0   empagliflozin  (JARDIANCE ) 25 MG TABS tablet Take 1 tablet (25 mg total) by mouth daily. 90 tablet 0   EPINEPHrine  (EPIPEN  2-PAK) 0.3 mg/0.3 mL IJ SOAJ injection Inject 0.3 mg into the muscle as needed for anaphylaxis. 2 each 0   escitalopram  (LEXAPRO ) 20 MG tablet  Take 1 tablet by mouth daily. 90 tablet 0   gabapentin  (NEURONTIN ) 250 MG/5ML solution Take 6 ml by mouth at bedtime. 180 mL 1   hydrochlorothiazide  (HYDRODIURIL ) 25 MG tablet Take 1 tablet by mouth daily. 90 tablet 0   insulin  degludec (TRESIBA  FLEXTOUCH) 100 UNIT/ML FlexTouch Pen INJECT 13 UNITS UNDER THE SKIN DAILY 15 mL 1   insulin  lispro (HUMALOG  KWIKPEN) 100 UNIT/ML KwikPen INJECT 5 TO 7 UNITS UNDER THE SKIN THREE TIMES DAILY WITH MEALS 15 mL 1   Insulin  Pen Needle (BD PEN NEEDLE NANO U/F) 32G X 4 MM MISC USE EVERY DAY 100 each 4   loratadine  (CLARITIN ) 10 MG tablet Take 1 tablet by mouth daily. 30 tablet 10   metFORMIN  (GLUCOPHAGE ) 1000 MG tablet Take 1 tablet by mouth twice daily with a meal. 180 tablet 0   methocarbamol  (ROBAXIN ) 500 MG tablet Take 500 mg by mouth 2 (two) times daily.     mometasone  (NASONEX ) 50 MCG/ACT nasal spray USE 2 SPRAYS INTO EACH NOSTRIL ONCE DAILY 34 each 1   morphine  (MS CONTIN ) 15 MG 12 hr tablet Take 1 tablet (15 mg total) by mouth every 8 (eight) hours. Must last 30 days. Do not break tablet 90 tablet 0   [START ON 03/06/2023] morphine  (MS CONTIN ) 15 MG 12 hr tablet Take 1 tablet (15 mg total) by mouth every 8 (eight) hours. Must last 30 days. Do not break tablet 90 tablet 0   naloxone  (NARCAN ) nasal spray 4 mg/0.1 mL Place 1 spray into the nose as needed for up to 365 doses (for opioid-induced respiratory depresssion). In case of emergency (overdose), spray once into each nostril. If no response within 3 minutes, repeat application and call 911. 1 each 0   olmesartan  (BENICAR ) 40 MG tablet Take 1 tablet by mouth daily. 90 tablet 0   pantoprazole  (PROTONIX ) 40 MG tablet Take 1 tablet by mouth daily. 90 tablet 0   rosuvastatin  (CRESTOR ) 40 MG tablet Take 1 tablet by mouth daily. 90 tablet 3   sennosides-docusate sodium (SENOKOT-S) 8.6-50 MG tablet Take 1 tablet by mouth daily as needed.      tamsulosin  (FLOMAX ) 0.4 MG CAPS capsule Take 1 capsule by mouth twice  daily. 180 capsule 0   tirzepatide  (MOUNJARO ) 7.5 MG/0.5ML Pen Inject 7.5 mg into the skin once a week. 6 mL 2   lubiprostone  (AMITIZA ) 8 MCG capsule TAKE 1 CAPSULE (8 MCG TOTAL) BY MOUTH 2 (TWO) TIMES DAILY WITH A MEAL. SWALLOW THE MEDICATION WHOLE. DO NOT BREAK OR CHEW THE MEDICATION. 180 capsule 1   morphine  (MS CONTIN ) 15 MG 12 hr tablet Take 1 tablet (15 mg total) by mouth every 8 (eight) hours. Must last 30 days. Do not break tablet 90 tablet 0   Wheat Dextrin (BENEFIBER) POWD Take 6 g by mouth 3 (three) times daily before meals. (2 tsp = 6  g) 730 g 2   No current facility-administered medications on file prior to visit.    There are no Patient Instructions on file for this visit. No follow-ups on file.   Khanh Cordner E Dyneshia Baccam, NP

## 2023-02-08 ENCOUNTER — Ambulatory Visit: Payer: Self-pay | Admitting: Family Medicine

## 2023-02-08 NOTE — Telephone Encounter (Signed)
 Called and spoke to pt. Pt stated that his left hand goes numb while holding the phone pt stated that It doesn't last long and has happened maybe one or twice. Pt denied having any shortness of breathe, dizziness or chest pain.   Informed pt that the recommendations was for him to be evaluated at the emergency room. Pt stated that he was currently waiting for a call back on a ride. Informed pt he could call ems pt stated he doesn't fill that ems is appropriate gave pt the non emergency number to call. Pt stated he was going to give them a call. Informed pt we would check back in with him tomorrow to see how he was doing. Pt verbalized understanding.  Sending to [facility] and PCP

## 2023-02-08 NOTE — Telephone Encounter (Addendum)
 Copied from CRM [id number]. Topic: Clinical - Red Word Triage >> Feb 08, 2023  3:21 PM Viola F wrote: Red Word that prompted transfer to Nurse Triage: Patient called regarding numbness in his left hand   Chief Complaint: Numbness to whole left hand Symptoms: numbness to whole hand,  Frequency: intermittent Pertinent Negatives: Patient denies chest pain, SOB, weakness, dizziness, injury, edema to arm/hand, palpitations, headache, vision changes Disposition: [x] ED /[] Urgent Care (no appt availability in office) / [] Appointment(In office/virtual)/ []  [place] [facility]/ [] Home Care/ [] Refused Recommended Disposition /[] [place] [facility]/ [x]  Follow-up with PCP Additional Notes: Pt reporting that for past couple days, been experiencing intermittent numbness to whole left hand, denies cardiac and other neurologic symptoms. Pt speaking in clear, full, coherent sentences, no seemingly slurred speech, pt is well-oriented. Pt reporting hx of edema but reporting that swelling okay today. Pt reporting the numbness comes on gradually in short period of time, comes on suddenly. Pt confirms no numbness at present. Advised pt go to ED and have another adult drive him. Confirmed pt should not drive himself. Advised call 911 if unable to find transportation. Pt wants PCP to know that he has trouble with edema in BLE at times, swelling okay today but had a time 7 months ago in which he needed to cut the compression sock off, thinking edema could have something to do with the heart. Nurse reassured pt that message would be relayed. Advised pt to head to ED for exam of numbness and possible causes. Pt verbalized understanding. Pt insisted on nurse relaying to Dr. Maribeth that pt wishes him and his family the absolute best of everything, wish he would stay or close enough for me to follow him. Pt verbalized appreciation. Nurse reassured pt that message would be relayed. Advised pt to head to ED. Pt verbalized  understanding.  Reason for Disposition  [1] Numbness (i.e., loss of sensation) of the face, arm / hand, or leg / foot on one side of the body AND [2] sudden onset AND [3] brief (now gone)  Answer Assessment - Initial Assessment Questions 1. SYMPTOM: What is the main symptom you are concerned about? (e.g., weakness, numbness)     Numbness in whole left hand, can sense it coming moving throughout the hand, once it begins it pretty quickly has engulfed the whole hand, the numbness, then as soon as switch hands it goes away 2. ONSET: When did this start? (minutes, hours, days; while sleeping)     Couple days 3. LAST NORMAL: When was the last time you (the patient) were normal (no symptoms)?     Before couple days ago 4. PATTERN Does this come and go, or has it been constant since it started?  Is it present now?     Intermittent, hand went numb holding onto the phone, switched hands and quickly went away, comes on sort of gradually but in short amount of time, holding with left hand today for this phone call and not happening.  5. CARDIAC SYMPTOMS: Have you had any of the following symptoms: chest pain, difficulty breathing, palpitations?     Denies 6. NEUROLOGIC SYMPTOMS: Have you had any of the following symptoms: headache, dizziness, vision loss, double vision, changes in speech, unsteady on your feet?     Denies but always feels unsteady on his feet due to previous hip revisions 7. OTHER SYMPTOMS: Do you have any other symptoms?     Some edema to his foot, sort of new, has been to  ortho 5-7 months ago  Protocols used: Neurologic Deficit-A-AH

## 2023-02-09 ENCOUNTER — Other Ambulatory Visit: Payer: Self-pay | Admitting: Family Medicine

## 2023-02-09 ENCOUNTER — Telehealth: Payer: Self-pay | Admitting: Family Medicine

## 2023-02-09 DIAGNOSIS — E1142 Type 2 diabetes mellitus with diabetic polyneuropathy: Secondary | ICD-10-CM

## 2023-02-09 NOTE — Telephone Encounter (Signed)
 Called pt to see how he was doing and if he has been evaluated for the hand numbness,   Pt stated that a nurse came out to his house and checked his BP and he stated she said his BP was normal. He stated he did not know exactly what his BP was but she said it was normal.    Pt stated he is feeling better today his hand has not been numb he denies having chest pain, SOB, numbness or tingling, blurred vision  He has been informed that if he develops the above Sx to be seen asap either via ED or UC.   Pt wanted to inform Dr. Maribeth that he is extremely grateful fro the care that he has given him.

## 2023-02-09 NOTE — Telephone Encounter (Signed)
 Detailed VM left informing pt to CB to get scheduled to follow up with PCP   Please have pt scheduled

## 2023-02-09 NOTE — Telephone Encounter (Signed)
 Pt is needing a appt for the numbness of his left hand he would need a call back it would not let me schedule the appt it told me in epic to send a request to the clinical pool and admin pool

## 2023-02-09 NOTE — Telephone Encounter (Signed)
 Please follow-up with the patient to make sure he got evaluated for this. If he is not willing to go to the ED he should at least go to urgent care.

## 2023-02-09 NOTE — Telephone Encounter (Signed)
 Noted.  If he does not want to be looked at at urgent care or the emergency room for this please get him scheduled for a visit with me or any other available provider to evaluate this in the office.  Thanks.

## 2023-02-10 ENCOUNTER — Encounter: Payer: Self-pay | Admitting: Family Medicine

## 2023-02-10 NOTE — Telephone Encounter (Deleted)
 Copied from CRM [id number]. Topic: Clinical - Red Word Triage >> Feb 08, 2023  3:21 PM Drema Balzarine wrote: Red Word that prompted transfer to Nurse Triage: Patient called regarding numbness in his left hand

## 2023-02-10 NOTE — Telephone Encounter (Signed)
 Pt has been scheduled.

## 2023-02-10 NOTE — Telephone Encounter (Signed)
 This encounter was created in error - please disregard.

## 2023-02-11 ENCOUNTER — Encounter: Payer: Self-pay | Admitting: Psychiatry

## 2023-02-11 ENCOUNTER — Ambulatory Visit (INDEPENDENT_AMBULATORY_CARE_PROVIDER_SITE_OTHER): Payer: 59 | Admitting: Psychiatry

## 2023-02-11 VITALS — BP 150/85 | HR 71 | Temp 97.3°F | Ht 72.0 in | Wt 250.4 lb

## 2023-02-11 DIAGNOSIS — Z79899 Other long term (current) drug therapy: Secondary | ICD-10-CM

## 2023-02-11 DIAGNOSIS — F32A Depression, unspecified: Secondary | ICD-10-CM

## 2023-02-11 DIAGNOSIS — F411 Generalized anxiety disorder: Secondary | ICD-10-CM

## 2023-02-11 MED ORDER — BUSPIRONE HCL 10 MG PO TABS: 10.0000 mg | ORAL_TABLET | Freq: Three times a day (TID) | ORAL | 1 refills | Status: DC

## 2023-02-11 NOTE — Progress Notes (Signed)
 Psychiatric Initial Adult Assessment   Patient Identification: Alan Schmidt MRN:  [id number] Date of Evaluation:  02/11/2023 Referral Source: Dr.Eric Maribeth Chief Complaint:   Chief Complaint  Patient presents with   Establish Care   Depression   Anxiety   Medication Refill   Visit Diagnosis:    ICD-10-CM   1. GAD (generalized anxiety disorder)  F41.1 busPIRone  (BUSPAR ) 10 MG tablet    2. Depression, unspecified depression type  F32.A     3. Long-term current use of benzodiazepine  Z79.899       History of Present Illness:  Alan Schmidt is a 78 year old African-American male, divorced, currently retired, futures trader, lives in [place], has a history of type 2 diabetes mellitus with diabetic polyneuropathy, essential hypertension, hyperlipidemia, migraine headaches, chronic pain, depression and anxiety, presented to establish care.  The patient, known as Alan Schmidt, is a retired and development worker, community with a history of multiple surgeries on the right hip and leg, including a hip replacement and subsequent revision. The patient reports enduring significant pain and discomfort, particularly in the lower back, which has led to a recent consultation with a neurosurgeon who proposed surgical intervention. The patient expresses apprehension about this option due to the potential risks and complications, as well as the ongoing struggle with pain management.  The patient's pain is severe enough to necessitate frequent periods of rest, with lying down being the only position that provides relief. This chronic pain, coupled with the patient's living situation, has led to heightened levels of anxiety. The patient lives alone and recently dealt with a bed bug infestation, which caused significant distress and exacerbated the anxiety symptoms. The patient is a secretary/administrator by profession, and the anxiety and pain have impacted his ability to work as before.  The  patient has been on multiple medications, including morphine  for pain management and alprazolam  for anxiety. However, the patient expresses uncertainty about the effectiveness of some of these medications, particularly escitalopram , and has not been taking bupropion  regularly due to a lack of understanding of its purpose. The patient is open to exploring new treatment options for anxiety and is considering psychotherapy.  Patient denies any history of manic or hypomanic symptoms.  Patient denies any history of trauma.  Patient is interested in management of his current anxiety which is mostly situational as well as as noted above reports mild depression symptoms ongoing.  Patient not compliant with his medications like the Wellbutrin .  Agreeable to trial of buspirone  .  Patient denies any suicidality, homicidality or perceptual disturbances.   Associated Signs/Symptoms: Depression Symptoms:  depressed mood, anhedonia, anxiety, (Hypo) Manic Symptoms:   Denies Anxiety Symptoms:  Excessive Worry, Psychotic Symptoms:   Denies PTSD Symptoms: Negative  Past Psychiatric History: Patient denies inpatient behavioral health admissions.  Denies suicide attempts or self-injurious behaviors.  Previous Psychotropic Medications: Yes Lexapro , Wellbutrin , Xanax   Substance Abuse History in the last 12 months:  No.  Consequences of Substance Abuse: Negative  Past Medical History:  Past Medical History:  Diagnosis Date   Acute postoperative pain 11/24/2016   Anxiety    Chronic hip pain (Right) 12/05/2014   Chronic lumbar pain    Depression    Diabetes mellitus without complication (HCC)    Hyperlipidemia    Hypertension    Kidney stones    Migraines     Past Surgical History:  Procedure Laterality Date   right hip surgery     4 surgeries   TONSILLECTOMY  TOTAL HIP ARTHROPLASTY      Family Psychiatric History: Denies any history of mental health problems in his family.  Family  History:  Family History  Problem Relation Age of Onset   Cancer Mother    Heart disease Father    Stroke Father    Diabetes Father    Diabetes Sister    Diabetes Sister    Mental illness Neg Hx     Social History:   Social History   Socioeconomic History   Marital status: Divorced    Spouse name: 1 Bio; 6 Adopted   Number of children: 7   Years of education: doctorate   Highest education level: Doctorate  Occupational History   Occupation: Retired  Tobacco Use   Smoking status: Former    Types: Cigarettes   Smokeless tobacco: Never  Vaping Use   Vaping status: Never Used  Substance and Sexual Activity   Alcohol use: No    Alcohol/week: 0.0 standard drinks of alcohol   Drug use: No   Sexual activity: Not Currently  Other Topics Concern   Not on file  Social History Narrative   Per patient he has 1 biological child and 6 adopted children   Social Drivers of Corporate Investment Banker Strain: Low Risk  (02/06/2022)   Overall Financial Resource Strain (CARDIA)    Difficulty of Paying Living Expenses: Not very hard  Food Insecurity: No Food Insecurity (02/06/2022)   Hunger Vital Sign    Worried About Running Out of Food in the Last Year: Never true    Ran Out of Food in the Last Year: Never true  Transportation Needs: No Transportation Needs (02/06/2022)   PRAPARE - Administrator, Civil Service (Medical): No    Lack of Transportation (Non-Medical): No  Physical Activity: Unknown (02/06/2022)   Exercise Vital Sign    Days of Exercise per Week: 0 days    Minutes of Exercise per Session: Not on file  Recent Concern: Physical Activity - Insufficiently Active (02/06/2022)   Exercise Vital Sign    Days of Exercise per Week: 3 days    Minutes of Exercise per Session: 30 min  Stress: No Stress Concern Present (02/06/2022)   [employer] of Occupational Health - Occupational Stress Questionnaire    Feeling of Stress : Only a little  Social Connections:  Moderately Integrated (02/06/2022)   Social Connection and Isolation Panel [NHANES]    Frequency of Communication with Friends and Family: More than three times a week    Frequency of Social Gatherings with Friends and Family: More than three times a week    Attends Religious Services: More than 4 times per year    Active Member of [employer] or Organizations: No    Attends Engineer, Structural: 1 to 4 times per year    Marital Status: Divorced    Additional Social History: Patient was born and raised in [place] .  Patient was raised by both parents.  He had 6 siblings.  Patient is currently divorced.  He has 1 biological son who currently lives in [place] .  Patient reports he helped raise 6 other children who were from his late wife.  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  Patient is religious.  He denies legal problems.  He denies being in [employer].  Currently lives in [place].  Allergies:  Allergies  Allergen Reactions   Contrast Media [Iodinated Contrast Media] Swelling   Iodine Swelling   Shellfish Allergy Nausea And Vomiting and Swelling    Metabolic Disorder Labs: Lab Results  Component Value Date   HGBA1C 10.0 (H) 06/08/2022   MPG 183 04/25/2021   No results found for: PROLACTIN Lab Results  Component Value Date   CHOL 120 06/08/2022   TRIG 85.0 06/08/2022   HDL 55.70 06/08/2022   CHOLHDL 2 06/08/2022   VLDL 17.0 06/08/2022   LDLCALC 47 06/08/2022   LDLCALC 74 04/25/2021   Lab Results  Component Value Date   TSH 3.52 09/22/2022    Therapeutic Level Labs: No results found for: LITHIUM No results found for: CBMZ No results found for: VALPROATE  Current Medications: Current Outpatient Medications  Medication Sig Dispense Refill   ACCU-CHEK GUIDE test strip USE AS INSTRUCTED DX CODE: E11.9 100 strip 5   acetaminophen  (TYLENOL )  500 MG tablet Take 500 mg by mouth every 6 (six) hours as needed.     ALPRAZolam  (XANAX ) 1 MG tablet TAKE 1/2 TABLET BY MOUTH 2 TIMES DAILY AS NEEDED FOR ANXIETY. 30 tablet 0   aspirin  EC 81 MG tablet Take 81 mg by mouth daily.     busPIRone  (BUSPAR ) 10 MG tablet Take 1 tablet (10 mg total) by mouth 3 (three) times daily. 90 tablet 1   carvedilol  (COREG ) 25 MG tablet Take 1 tablet by mouth twice daily. 180 tablet 1   Continuous Blood Gluc Receiver (FREESTYLE LIBRE 14 DAY READER) DEVI 1 Device by Does not apply route daily. Use to scan to check blood sugar up to 7 times daily; E11.42, 1 Device 1   Continuous Glucose Sensor (FREESTYLE LIBRE 14 DAY SENSOR) MISC 1 Device by Does not apply route every 14 (fourteen) days. E11.42 6 each 3   desoximetasone  (TOPICORT ) 0.25 % cream APPLY CREAM TO AFFECTED AREA TWO TIMES DAILY, FOR UP TO 7 DAYS, DO NOT APPLY TO FACE   Strength: 0.25 % 30 g 0   empagliflozin  (JARDIANCE ) 25 MG TABS tablet Take 1 tablet (25 mg total) by mouth daily. 90 tablet 0   EPINEPHrine  (EPIPEN  2-PAK) 0.3 mg/0.3 mL IJ SOAJ injection Inject 0.3 mg into the muscle as needed for anaphylaxis. 2 each 0   escitalopram  (LEXAPRO ) 20 MG tablet Take 1 tablet by mouth daily. 90 tablet 0   gabapentin  (NEURONTIN ) 250 MG/5ML solution Take 6 ml by mouth at bedtime. 180 mL 1   hydrochlorothiazide  (HYDRODIURIL ) 25 MG tablet Take 1 tablet by mouth daily. 90 tablet 0   insulin  degludec (TRESIBA  FLEXTOUCH) 100 UNIT/ML FlexTouch Pen INJECT 13 UNITS UNDER THE SKIN DAILY 15 mL 1   insulin  lispro (HUMALOG  KWIKPEN) 100 UNIT/ML KwikPen INJECT 5 TO 7 UNITS UNDER THE SKIN THREE TIMES DAILY WITH MEALS 15 mL 1   Insulin  Pen Needle (BD PEN NEEDLE NANO U/F) 32G X 4 MM MISC USE EVERY DAY 100 each 4   loratadine  (CLARITIN ) 10 MG tablet Take 1 tablet by mouth daily. 30 tablet 10   metFORMIN  (GLUCOPHAGE ) 1000 MG tablet Take 1 tablet by mouth twice daily with a meal. 180 tablet 0   methocarbamol  (ROBAXIN ) 500 MG tablet Take  500 mg by mouth 2 (two) times daily.     mometasone  (NASONEX ) 50 MCG/ACT nasal spray USE 2 SPRAYS INTO EACH NOSTRIL ONCE DAILY 34 each 1   morphine  (MS CONTIN ) 15 MG 12 hr tablet Take 1 tablet (15 mg total) by mouth every  8 (eight) hours. Must last 30 days. Do not break tablet 90 tablet 0   [START ON 03/06/2023] morphine  (MS CONTIN ) 15 MG 12 hr tablet Take 1 tablet (15 mg total) by mouth every 8 (eight) hours. Must last 30 days. Do not break tablet 90 tablet 0   naloxone  (NARCAN ) nasal spray 4 mg/0.1 mL Place 1 spray into the nose as needed for up to 365 doses (for opioid-induced respiratory depresssion). In case of emergency (overdose), spray once into each nostril. If no response within 3 minutes, repeat application and call 911. 1 each 0   olmesartan  (BENICAR ) 40 MG tablet Take 1 tablet by mouth daily. 90 tablet 0   pantoprazole  (PROTONIX ) 40 MG tablet Take 1 tablet by mouth daily. 90 tablet 0   rosuvastatin  (CRESTOR ) 40 MG tablet Take 1 tablet by mouth daily. 90 tablet 3   sennosides-docusate sodium (SENOKOT-S) 8.6-50 MG tablet Take 1 tablet by mouth daily as needed.      tamsulosin  (FLOMAX ) 0.4 MG CAPS capsule Take 1 capsule by mouth twice daily. 180 capsule 0   tirzepatide  (MOUNJARO ) 7.5 MG/0.5ML Pen INJECT 7.5 MG SUBCUTANEOUSLY WEEKLY 2 mL 2   buPROPion  (WELLBUTRIN  XL) 150 MG 24 hr tablet TAKE 1 TABLET (150 MG TOTAL) BY MOUTH DAILY FOR 7 DAYS, THEN 2 TABLETS (300 MG TOTAL) DAILY. (Patient not taking: Reported on 02/11/2023) 180 tablet 0   lubiprostone  (AMITIZA ) 8 MCG capsule TAKE 1 CAPSULE (8 MCG TOTAL) BY MOUTH 2 (TWO) TIMES DAILY WITH A MEAL. SWALLOW THE MEDICATION WHOLE. DO NOT BREAK OR CHEW THE MEDICATION. 180 capsule 1   morphine  (MS CONTIN ) 15 MG 12 hr tablet Take 1 tablet (15 mg total) by mouth every 8 (eight) hours. Must last 30 days. Do not break tablet 90 tablet 0   Wheat Dextrin (BENEFIBER) POWD Take 6 g by mouth 3 (three) times daily before meals. (2 tsp = 6 g) 730 g 2   No current  facility-administered medications for this visit.    Musculoskeletal: Strength & Muscle Tone: within normal limits Gait & Station:  Walks with cane Patient leans: N/A  Psychiatric Specialty Exam: Review of Systems  Psychiatric/Behavioral:  Positive for dysphoric mood. The patient is nervous/anxious.     Blood pressure (!) 150/85, pulse 71, temperature (!) 97.3 F (36.3 C), temperature source Skin, height 6' (1.829 m), weight 250 lb 6.4 oz (113.6 kg).Body mass index is 33.96 kg/m.  General Appearance: Casual  Eye Contact:  Fair  Speech:  Clear and Coherent  Volume:  Normal  Mood:  Anxious and Depressed  Affect:  Appropriate  Thought Process:  Goal Directed and Descriptions of Associations: Intact  Orientation:  Full (Time, Place, and Person)  Thought Content:  Logical  Suicidal Thoughts:  No  Homicidal Thoughts:  No  Memory:  Immediate;   Fair Recent;   Fair Remote;   Fair  Judgement:  Fair  Insight:  Fair  Psychomotor Activity:  Normal  Concentration:  Concentration: Fair and Attention Span: Fair  Recall:  [employer] of Knowledge:Fair  Language: Fair  Akathisia:  No  Handed:  Right  AIMS (if indicated):  not done  Assets:  Desire for Improvement Housing Social Support  ADL's:  Intact  Cognition: WNL  Sleep:  Fair   Screenings: GAD-7    Garment/textile Technologist Visit from 02/11/2023 in [facility] Office Visit from 12/28/2022 in [facility] Visit from 09/22/2022 in [place]  [facility] Visit from 06/08/2022 in [facility] Visit from 07/17/2015 in [facility] [facility]  Total GAD-7 Score 9 11 0 0 11      PHQ2-9    Flowsheet Row Office Visit from 02/11/2023 in [facility] Most recent reading at 02/11/2023  3:17 PM Office Visit from  12/28/2022 in [facility] Most recent reading at 12/28/2022  2:18 PM Office Visit from 12/28/2022 in [facility] [place] Most recent reading at 12/28/2022  8:30 AM Office Visit from 09/22/2022 in [facility] [place] Most recent reading at 09/22/2022  9:08 AM Office Visit from 08/10/2022 in [facility] Most recent reading at 08/10/2022  1:44 PM  PHQ-2 Total Score 2 0 2 0 0  PHQ-9 Total Score 7 -- 10 1 --      Flowsheet Row Office Visit from 02/11/2023 in [facility]  C-SSRS RISK CATEGORY No Risk       Assessment and Plan: Alan Schmidt is a 78 year old African-American male with multiple medical problems including chronic pain recent situational stressors which has triggered anxiety and depression symptoms, discussed assessment and plan as noted below.  Anxiety Disorder-unstable Anxiety exacerbated by recent stressors including bed bug infestation and interpersonal conflicts. Currently on escitalopram  and alprazolam . Reports significant relief with alprazolam  but aware of risks, especially respiratory depression when combined with morphine . - Add buspirone  10 mg three times a day. - Lexapro  20 mg daily. - Taper off alprazolam  under Dr. Deedra guidance - Consider referral for cognitive behavioral therapy (CBT)  Depressive Disorder-unspecified-unstable Mild depressive symptoms. Inconsistent use of bupropion  due to lack of understanding of its purpose. Educated on the importance of consistent use for depression management. - Educate on consistent use of bupropion , currently prescribed as bupropion  XL 300 mg daily - Continue Lexapro  20 mg daily - Monitor depressive symptoms and adjust treatment as needed - I have referred him for CBT with our in-house  therapist.  Patient to start CBT.  Long-term current use of benzodiazepine--unstable Provided education, benzodiazepines are not the preferred treatment for anxiety, insomnia especially among older adults.  Provided education about the importance to reevaluate this medication regimen and possibly taper off the Xanax  especially due to his use of other medications including morphine  which does have drug to drug interaction with his benzodiazepine.  Patient also at risk of falls especially with his balance issue, as well as possible cognitive issues in the future. - Recommend tapering off Xanax .  Patient agrees to discuss with primary care provider who is currently prescribing it.   I have reviewed labs-09/22/2022-TSH-within normal limits, CBC with differential-within normal limits, sodium-140-within normal limits.  Follow-up - Follow up in six weeks - Schedule appointment with therapist for CBT - Monitor and repeat blood pressure readings.     Collaboration of Care: Referral or follow-up with counselor/therapist AEB patient referred for CBT I have reviewed notes from Dr. Maribeth dated 12/28/2018 24-8 was continue on Lexapro , Wellbutrin  and Xanax  at that time.   Patient/Guardian was advised Release of Information must be obtained prior to any record release in order to collaborate their care with an outside provider. Patient/Guardian was advised if they have not already done so to contact the registration department to sign all necessary forms in order for  us  to release information regarding their care.   Consent: Patient/Guardian gives verbal consent for treatment and assignment of benefits for services provided during this visit. Patient/Guardian expressed understanding and agreed to proceed.  This note was generated in part or whole with voice recognition software. Voice recognition is usually quite accurate but there are transcription errors that can and very often do occur. I apologize  for any typographical errors that were not detected and corrected.  I have spent atleast 60 minutes face to face with patient today which includes the time spent for preparing to see the patient ( e.g., review of test, records ), obtaining and to review and separately obtained history , ordering medications and test ,psychoeducation and supportive psychotherapy and care coordination,as well as documenting clinical information in electronic health record.   Joseth Weigel, MD 1/10/20259:38 AM

## 2023-02-11 NOTE — Patient Instructions (Signed)
 Buspirone Tablets What is this medication? BUSPIRONE (byoo SPYE rone) treats anxiety. It works by balancing the levels of dopamine and serotonin in your brain, substances that help regulate mood. This medicine may be used for other purposes; ask your health care provider or pharmacist if you have questions. COMMON BRAND NAME(S): BuSpar, Buspar Dividose What should I tell my care team before I take this medication? They need to know if you have any of these conditions: Kidney or liver disease An unusual or allergic reaction to buspirone, other medications, foods, dyes, or preservatives Pregnant or trying to get pregnant Breast-feeding How should I use this medication? Take this medication by mouth with a glass of water. Follow the directions on the prescription label. You may take this medication with or without food. To ensure that this medication always works the same way for you, you should take it either always with or always without food. Take your doses at regular intervals. Do not take your medication more often than directed. Do not stop taking except on the advice of your care team. Talk to your care team about the use of this medication in children. Special care may be needed. Overdosage: If you think you have taken too much of this medicine contact a poison control center or emergency room at once. NOTE: This medicine is only for you. Do not share this medicine with others. What if I miss a dose? If you miss a dose, take it as soon as you can. If it is almost time for your next dose, take only that dose. Do not take double or extra doses. What may interact with this medication? Do not take this medication with any of the following: Linezolid MAOIs like Carbex, Eldepryl, Marplan, Nardil, and Parnate Methylene blue Procarbazine This medication may also interact with the following: Diazepam Digoxin Diltiazem Erythromycin Grapefruit juice Haloperidol Medications for mental  depression or mood problems Medications for seizures like carbamazepine, phenobarbital and phenytoin Nefazodone Other medications for anxiety Rifampin Ritonavir Some antifungal medications like itraconazole, ketoconazole, and voriconazole Verapamil Warfarin This list may not describe all possible interactions. Give your health care provider a list of all the medicines, herbs, non-prescription drugs, or dietary supplements you use. Also tell them if you smoke, drink alcohol, or use illegal drugs. Some items may interact with your medicine. What should I watch for while using this medication? Visit your care team for regular checks on your progress. It may take 1 to 2 weeks before your anxiety gets better. This medication may affect your coordination, reaction time, or judgment. Do not drive or operate machinery until you know how this medication affects you. Sit up or stand slowly to reduce the risk of dizzy or fainting spells. Drinking alcohol with this medication can increase the risk of these side effects. What side effects may I notice from receiving this medication? Side effects that you should report to your care team as soon as possible: Allergic reactions--skin rash, itching, hives, swelling of the face, lips, tongue, or throat Irritability, confusion, fast or irregular heartbeat, muscle stiffness, twitching muscles, sweating, high fever, seizure, chills, vomiting, diarrhea, which may be signs of serotonin syndrome Side effects that usually do not require medical attention (report to your care team if they continue or are bothersome): Anxiety, nervousness Dizziness Drowsiness Headache Nausea Trouble sleeping This list may not describe all possible side effects. Call your doctor for medical advice about side effects. You may report side effects to FDA at 1-800-FDA-1088. Where should I keep  my medication? Keep out of the reach of children. Store at room temperature below 30 degrees C  (86 degrees F). Protect from light. Keep container tightly closed. Throw away any unused medication after the expiration date. NOTE: This sheet is a summary. It may not cover all possible information. If you have questions about this medicine, talk to your doctor, pharmacist, or health care provider.  2024 Elsevier/Gold Standard (2021-08-11 00:00:00)

## 2023-02-12 ENCOUNTER — Encounter: Payer: Self-pay | Admitting: Psychiatry

## 2023-02-16 ENCOUNTER — Ambulatory Visit (INDEPENDENT_AMBULATORY_CARE_PROVIDER_SITE_OTHER): Payer: 59 | Admitting: Family Medicine

## 2023-02-16 ENCOUNTER — Encounter: Payer: Self-pay | Admitting: Family Medicine

## 2023-02-16 ENCOUNTER — Ambulatory Visit (INDEPENDENT_AMBULATORY_CARE_PROVIDER_SITE_OTHER): Payer: 59 | Admitting: *Deleted

## 2023-02-16 VITALS — Ht 72.0 in | Wt 249.0 lb

## 2023-02-16 VITALS — BP 130/82 | HR 75 | Temp 97.5°F | Ht 72.0 in | Wt 249.0 lb

## 2023-02-16 DIAGNOSIS — R200 Anesthesia of skin: Secondary | ICD-10-CM | POA: Diagnosis not present

## 2023-02-16 DIAGNOSIS — Z0000 Encounter for general adult medical examination without abnormal findings: Secondary | ICD-10-CM | POA: Diagnosis not present

## 2023-02-16 DIAGNOSIS — I10 Essential (primary) hypertension: Secondary | ICD-10-CM | POA: Diagnosis not present

## 2023-02-16 DIAGNOSIS — R2689 Other abnormalities of gait and mobility: Secondary | ICD-10-CM | POA: Diagnosis not present

## 2023-02-16 NOTE — Patient Instructions (Signed)
 [name] , Thank you for taking time to come for your Medicare Wellness Visit. I appreciate your ongoing commitment to your health goals. Please review the following plan we discussed and let me know if I can assist you in the future.   Referrals/Orders/Follow-Ups/Clinician Recommendations: None  This is a list of the screening recommended for you and due dates:  Health Maintenance  Topic Date Due   Hepatitis C Screening  Never done   Hemoglobin A1C  12/09/2022   COVID-19 Vaccine (8 - 2024-25 season) 03/03/2023*   Yearly kidney health urinalysis for diabetes  06/08/2023   Yearly kidney function blood test for diabetes  09/22/2023   Eye exam for diabetics  01/11/2024   Complete foot exam   01/19/2024   Medicare Annual Wellness Visit  02/16/2024   DTaP/Tdap/Td vaccine (2 - Td or Tdap) 04/08/2025   Pneumonia Vaccine  Completed   Flu Shot  Completed   Zoster (Shingles) Vaccine  Completed   HPV Vaccine  Aged Out  *Topic was postponed. The date shown is not the original due date.    Advanced directives: (Declined) Advance directive discussed with you today. Even though you declined this today, please call our office should you change your mind, and we can give you the proper paperwork for you to fill out.  Next Medicare Annual Wellness Visit scheduled for next year: Yes 02/22/24 @ 1:00  Managing Pain Without Opioids  Opioids are strong medicines used to treat moderate to severe pain. For some people, especially those who have long-term (chronic) pain, opioids may not be the best choice for pain management due to: Side effects like nausea, constipation, and sleepiness. The risk of addiction (opioid use disorder). The longer you take opioids, the greater your risk of addiction. Pain that lasts for more than 3 months is called chronic pain. Managing chronic pain usually requires more than one approach and is often provided by a team of health care providers working together  (multidisciplinary approach). Pain management may be done at a pain management center or pain clinic. How to manage pain without the use of opioids Use non-opioid medicines Non-opioid medicines for pain may include: Over-the-counter or prescription non-steroidal anti-inflammatory drugs (NSAIDs). These may be the first medicines used for pain. They work well for muscle and bone pain, and they reduce swelling. Acetaminophen . This over-the-counter medicine may work well for milder pain but not swelling. Antidepressants. These may be used to treat chronic pain. A certain type of antidepressant (tricyclics) is often used. These medicines are given in lower doses for pain than when used for depression. Anticonvulsants. These are usually used to treat seizures but may also reduce nerve (neuropathic) pain. Muscle relaxants. These relieve pain caused by sudden muscle tightening (spasms). You may also use a pain medicine that is applied to the skin as a patch, cream, or gel (topical analgesic), such as a numbing medicine. These may cause fewer side effects than medicines taken by mouth. Do certain therapies as directed Some therapies can help with pain management. They include: Physical therapy. You will do exercises to gain strength and flexibility. A physical therapist may teach you exercises to move and stretch parts of your body that are weak, stiff, or painful. You can learn these exercises at physical therapy visits and practice them at home. Physical therapy may also involve: Massage. Heat wraps or applying heat or cold to affected areas. Electrical signals that interrupt pain signals (transcutaneous electrical nerve stimulation, TENS). Weak lasers that reduce pain  and swelling (low-level laser therapy). Signals from your body that help you learn to regulate pain (biofeedback). Occupational therapy. This helps you to learn ways to function at home and work with less pain. Recreational therapy. This  involves trying new activities or hobbies, such as a physical activity or drawing. Mental health therapy, including: Cognitive behavioral therapy (CBT). This helps you learn coping skills for dealing with pain. Acceptance and commitment therapy (ACT) to change the way you think and react to pain. Relaxation therapies, including muscle relaxation exercises and mindfulness-based stress reduction. Pain management counseling. This may be individual, family, or group counseling.  Receive medical treatments Medical treatments for pain management include: Nerve block injections. These may include a pain blocker and anti-inflammatory medicines. You may have injections: Near the spine to relieve chronic back or neck pain. Into joints to relieve back or joint pain. Into nerve areas that supply a painful area to relieve body pain. Into muscles (trigger point injections) to relieve some painful muscle conditions. A medical device placed near your spine to help block pain signals and relieve nerve pain or chronic back pain (spinal cord stimulation device). Acupuncture. Follow these instructions at home Medicines Take over-the-counter and prescription medicines only as told by your health care provider. If you are taking pain medicine, ask your health care providers about possible side effects to watch out for. Do not drive or use heavy machinery while taking prescription opioid pain medicine. Lifestyle  Do not use drugs or alcohol to reduce pain. If you drink alcohol, limit how much you have to: 0-1 drink a day for women who are not pregnant. 0-2 drinks a day for men. Know how much alcohol is in a drink. In the [place] one drink equals one 12 oz bottle of beer (355 mL), one 5 oz glass of wine (148 mL), or one 1 oz glass of hard liquor (44 mL). Do not use any products that contain nicotine or tobacco. These products include cigarettes, chewing tobacco, and vaping devices, such as e-cigarettes. If you  need help quitting, ask your health care provider. Eat a healthy diet and maintain a healthy weight. Poor diet and excess weight may make pain worse. Eat foods that are high in fiber. These include fresh fruits and vegetables, whole grains, and beans. Limit foods that are high in fat and processed sugars, such as fried and sweet foods. Exercise regularly. Exercise lowers stress and may help relieve pain. Ask your health care provider what activities and exercises are safe for you. If your health care provider approves, join an exercise class that combines movement and stress reduction. Examples include yoga and tai chi. Get enough sleep. Lack of sleep may make pain worse. Lower stress as much as possible. Practice stress reduction techniques as told by your therapist. General instructions Work with all your pain management providers to find the treatments that work best for you. You are an important member of your pain management team. There are many things you can do to reduce pain on your own. Consider joining an online or in-person support group for people who have chronic pain. Keep all follow-up visits. This is important. Where to find more information You can find more information about managing pain without opioids from: American Academy of Pain Medicine: painmed.org Institute for Chronic Pain: instituteforchronicpain.org American Chronic Pain Association: theacpa.org Contact a health care provider if: You have side effects from pain medicine. Your pain gets worse or does not get better with treatments or home therapy.  You are struggling with anxiety or depression. Summary Many types of pain can be managed without opioids. Chronic pain may respond better to pain management without opioids. Pain is best managed when you and a team of health care providers work together. Pain management without opioids may include non-opioid medicines, medical treatments, physical therapy, mental health  therapy, and lifestyle changes. Tell your health care providers if your pain gets worse or is not being managed well enough. This information is not intended to replace advice given to you by your health care provider. Make sure you discuss any questions you have with your health care provider. Document Revised: 05/01/2020 Document Reviewed: 05/01/2020 Elsevier Patient Education  2024 [employer].

## 2023-02-16 NOTE — Progress Notes (Signed)
 2  Subjective:   DEANTRE BOURDON is a 78 y.o. male who presents for Medicare Annual/Subsequent preventive examination.  Visit Complete: Virtual I connected with  Victory ELINORE Hummer on 02/16/23 by a audio enabled telemedicine application and verified that I am speaking with the correct person using two identifiers. This patient declined Interactive audio and acupuncturist. Therefore the visit was completed with audio only.   Patient Location: Home  Provider Location: Office/Clinic  I discussed the limitations of evaluation and management by telemedicine. The patient expressed understanding and agreed to proceed.  Vital Signs: Because this visit was a virtual/telehealth visit, some criteria may be missing or patient reported. Any vitals not documented were not able to be obtained and vitals that have been documented are patient reported.   Cardiac Risk Factors include: advanced age (>21men, >64 women);diabetes mellitus;dyslipidemia;hypertension;male gender;obesity (BMI >30kg/m2)     Objective:    Today's Vitals   02/16/23 1423  Weight: 249 lb (112.9 kg)  Height: 6' (1.829 m)   Body mass index is 33.77 kg/m.     02/16/2023    2:40 PM 12/28/2022    2:18 PM 08/10/2022    1:44 PM 04/14/2022    8:58 AM 02/06/2022    1:33 PM 09/29/2021    1:39 PM 02/05/2021    3:04 PM  Advanced Directives  Does Patient Have a Medical Advance Directive? No No No No No No No  Would patient like information on creating a medical advance directive? No - Patient declined    No - Patient declined No - Patient declined No - Patient declined    Current Medications (verified) Outpatient Encounter Medications as of 02/16/2023  Medication Sig   ACCU-CHEK GUIDE test strip USE AS INSTRUCTED DX CODE: E11.9   acetaminophen  (TYLENOL ) 500 MG tablet Take 500 mg by mouth every 6 (six) hours as needed.   ALPRAZolam  (XANAX ) 1 MG tablet TAKE 1/2 TABLET BY MOUTH 2 TIMES DAILY AS NEEDED FOR ANXIETY.   aspirin  EC 81  MG tablet Take 81 mg by mouth daily.   buPROPion  (WELLBUTRIN  XL) 150 MG 24 hr tablet TAKE 1 TABLET (150 MG TOTAL) BY MOUTH DAILY FOR 7 DAYS, THEN 2 TABLETS (300 MG TOTAL) DAILY.   busPIRone  (BUSPAR ) 10 MG tablet Take 1 tablet (10 mg total) by mouth 3 (three) times daily.   carvedilol  (COREG ) 25 MG tablet Take 1 tablet by mouth twice daily.   Continuous Blood Gluc Receiver (FREESTYLE LIBRE 14 DAY READER) DEVI 1 Device by Does not apply route daily. Use to scan to check blood sugar up to 7 times daily; E11.42,   Continuous Glucose Sensor (FREESTYLE LIBRE 14 DAY SENSOR) MISC 1 Device by Does not apply route every 14 (fourteen) days. E11.42   desoximetasone  (TOPICORT ) 0.25 % cream APPLY CREAM TO AFFECTED AREA TWO TIMES DAILY, FOR UP TO 7 DAYS, DO NOT APPLY TO FACE   Strength: 0.25 %   empagliflozin  (JARDIANCE ) 25 MG TABS tablet Take 1 tablet (25 mg total) by mouth daily.   EPINEPHrine  (EPIPEN  2-PAK) 0.3 mg/0.3 mL IJ SOAJ injection Inject 0.3 mg into the muscle as needed for anaphylaxis.   escitalopram  (LEXAPRO ) 20 MG tablet Take 1 tablet by mouth daily.   gabapentin  (NEURONTIN ) 250 MG/5ML solution Take 6 ml by mouth at bedtime.   hydrochlorothiazide  (HYDRODIURIL ) 25 MG tablet Take 1 tablet by mouth daily.   insulin  degludec (TRESIBA  FLEXTOUCH) 100 UNIT/ML FlexTouch Pen INJECT 13 UNITS UNDER THE SKIN DAILY   insulin  lispro (  HUMALOG  KWIKPEN) 100 UNIT/ML KwikPen INJECT 5 TO 7 UNITS UNDER THE SKIN THREE TIMES DAILY WITH MEALS   Insulin  Pen Needle (BD PEN NEEDLE NANO U/F) 32G X 4 MM MISC USE EVERY DAY   loratadine  (CLARITIN ) 10 MG tablet Take 1 tablet by mouth daily.   metFORMIN  (GLUCOPHAGE ) 1000 MG tablet Take 1 tablet by mouth twice daily with a meal.   methocarbamol  (ROBAXIN ) 500 MG tablet Take 500 mg by mouth 2 (two) times daily.   mometasone  (NASONEX ) 50 MCG/ACT nasal spray USE 2 SPRAYS INTO EACH NOSTRIL ONCE DAILY   morphine  (MS CONTIN ) 15 MG 12 hr tablet Take 1 tablet (15 mg total) by mouth every 8  (eight) hours. Must last 30 days. Do not break tablet   [START ON 03/06/2023] morphine  (MS CONTIN ) 15 MG 12 hr tablet Take 1 tablet (15 mg total) by mouth every 8 (eight) hours. Must last 30 days. Do not break tablet   naloxone  (NARCAN ) nasal spray 4 mg/0.1 mL Place 1 spray into the nose as needed for up to 365 doses (for opioid-induced respiratory depresssion). In case of emergency (overdose), spray once into each nostril. If no response within 3 minutes, repeat application and call 911.   olmesartan  (BENICAR ) 40 MG tablet Take 1 tablet by mouth daily.   pantoprazole  (PROTONIX ) 40 MG tablet Take 1 tablet by mouth daily.   rosuvastatin  (CRESTOR ) 40 MG tablet Take 1 tablet by mouth daily.   sennosides-docusate sodium (SENOKOT-S) 8.6-50 MG tablet Take 1 tablet by mouth daily as needed.    tamsulosin  (FLOMAX ) 0.4 MG CAPS capsule Take 1 capsule by mouth twice daily.   tirzepatide  (MOUNJARO ) 7.5 MG/0.5ML Pen INJECT 7.5 MG SUBCUTANEOUSLY WEEKLY   lubiprostone  (AMITIZA ) 8 MCG capsule TAKE 1 CAPSULE (8 MCG TOTAL) BY MOUTH 2 (TWO) TIMES DAILY WITH A MEAL. SWALLOW THE MEDICATION WHOLE. DO NOT BREAK OR CHEW THE MEDICATION.   morphine  (MS CONTIN ) 15 MG 12 hr tablet Take 1 tablet (15 mg total) by mouth every 8 (eight) hours. Must last 30 days. Do not break tablet   Wheat Dextrin (BENEFIBER) POWD Take 6 g by mouth 3 (three) times daily before meals. (2 tsp = 6 g)   No facility-administered encounter medications on file as of 02/16/2023.    Allergies (verified) Contrast media [iodinated contrast media], Iodine, and Shellfish allergy   History: Past Medical History:  Diagnosis Date   Acute postoperative pain 11/24/2016   Anxiety    Chronic hip pain (Right) 12/05/2014   Chronic lumbar pain    Depression    Diabetes mellitus without complication (HCC)    Hyperlipidemia    Hypertension    Kidney stones    Migraines    Past Surgical History:  Procedure Laterality Date   right hip surgery     4 surgeries    TONSILLECTOMY     TOTAL HIP ARTHROPLASTY     Family History  Problem Relation Age of Onset   Cancer Mother    Heart disease Father    Stroke Father    Diabetes Father    Diabetes Sister    Diabetes Sister    Mental illness Neg Hx    Social History   Socioeconomic History   Marital status: Divorced    Spouse name: 1 Bio; 6 Adopted   Number of children: 7   Years of education: doctorate   Highest education level: Doctorate  Occupational History   Occupation: Retired  Tobacco Use   Smoking status: Former  Types: Cigarettes   Smokeless tobacco: Never  Vaping Use   Vaping status: Never Used  Substance and Sexual Activity   Alcohol use: No    Alcohol/week: 0.0 standard drinks of alcohol   Drug use: No   Sexual activity: Not Currently  Other Topics Concern   Not on file  Social History Narrative   Per patient he has 1 biological child and 6 adopted children   Social Drivers of Corporate Investment Banker Strain: Low Risk  (02/16/2023)   Overall Financial Resource Strain (CARDIA)    Difficulty of Paying Living Expenses: Not hard at all  Food Insecurity: No Food Insecurity (02/16/2023)   Hunger Vital Sign    Worried About Running Out of Food in the Last Year: Never true    Ran Out of Food in the Last Year: Never true  Transportation Needs: No Transportation Needs (02/16/2023)   PRAPARE - Administrator, Civil Service (Medical): No    Lack of Transportation (Non-Medical): No  Physical Activity: Inactive (02/16/2023)   Exercise Vital Sign    Days of Exercise per Week: 0 days    Minutes of Exercise per Session: 0 min  Stress: No Stress Concern Present (02/16/2023)   [employer] of Occupational Health - Occupational Stress Questionnaire    Feeling of Stress : Only a little  Social Connections: Socially Isolated (02/16/2023)   Social Connection and Isolation Panel [NHANES]    Frequency of Communication with Friends and Family: More than three times a  week    Frequency of Social Gatherings with Friends and Family: Never    Attends Religious Services: Never    Database Administrator or Organizations: No    Attends Engineer, Structural: Never    Marital Status: Divorced    Tobacco Counseling Counseling given: Not Answered   Clinical Intake:  Pre-visit preparation completed: Yes  Pain : No/denies pain     BMI - recorded: 33.77 Nutritional Status: BMI > 30  Obese Nutritional Risks: None Diabetes: Yes CBG done?: Yes (BS 155 per patient) CBG resulted in Enter/ Edit results?: No Did pt. bring in CBG monitor from home?: No  How often do you need to have someone help you when you read instructions, pamphlets, or other written materials from your doctor or pharmacy?: 1 - Never  Interpreter Needed?: No  Information entered by :: R. Kendel Pesnell LPN   Activities of Daily Living    02/16/2023    2:27 PM  In your present state of health, do you have any difficulty performing the following activities:  Hearing? 1  Vision? 0  Comment glasses  Difficulty concentrating or making decisions? 0  Walking or climbing stairs? 1  Dressing or bathing? 0  Doing errands, shopping? 0  Preparing Food and eating ? N  Using the Toilet? N  In the past six months, have you accidently leaked urine? N  Do you have problems with loss of bowel control? N  Managing your Medications? N  Managing your Finances? N  Housekeeping or managing your Housekeeping? Y    Patient Care Team: Maribeth Camellia MATSU, MD as PCP - General (Family Medicine)  Indicate any recent Medical Services you may have received from other than [facility] providers in the past year (date may be approximate).     Assessment:   This is a routine wellness examination for Klint.  Hearing/Vision screen Hearing Screening - Comments:: Some issues Vision Screening - Comments:: glasses   Goals Addressed  This Visit's Progress    Patient Stated       Wants to be  able to walk further after having some PT       Depression Screen    02/16/2023    2:36 PM 02/16/2023   11:10 AM 02/11/2023    3:17 PM 12/28/2022    2:18 PM 12/28/2022    8:30 AM 09/22/2022    9:08 AM 08/10/2022    1:44 PM  PHQ 2/9 Scores  PHQ - 2 Score 0 0  0 2 0 0  PHQ- 9 Score 0 0   10 1      Information is confidential and restricted. Go to Review Flowsheets to unlock data.    Fall Risk    02/16/2023    2:30 PM 02/16/2023   11:10 AM 12/28/2022    2:18 PM 12/28/2022    8:29 AM 10/07/2022    1:13 PM  Fall Risk   Falls in the past year? 0 0 0 0 0  Number falls in past yr: 0 0  0   Injury with Fall? 0 0  1   Risk for fall due to : No Fall Risks No Fall Risks  No Fall Risks   Follow up Falls prevention discussed;Falls evaluation completed Falls evaluation completed  Falls evaluation completed     MEDICARE RISK AT HOME: Medicare Risk at Home Any stairs in or around the home?: Yes If so, are there any without handrails?: No Home free of loose throw rugs in walkways, pet beds, electrical cords, etc?: Yes Adequate lighting in your home to reduce risk of falls?: Yes Life alert?: No Use of a cane, walker or w/c?: Yes Grab bars in the bathroom?: Yes Shower chair or bench in shower?: Yes Elevated toilet seat or a handicapped toilet?: No   Cognitive Function:    08/17/2019    1:49 PM  MMSE - Mini Mental State Exam  Not completed: Unable to complete        02/16/2023    2:40 PM 02/06/2022    1:28 PM  6CIT Screen  What Year? 0 points 0 points  What month? 0 points 0 points  What time? 0 points 0 points  Count back from 20 0 points 0 points  Months in reverse 0 points 0 points  Repeat phrase 2 points 0 points  Total Score 2 points 0 points    Immunizations Immunization History  Administered Date(s) Administered   Fluad Quad(high Dose 65+) 10/26/2018, 01/05/2022   Influenza, High Dose Seasonal PF 11/21/2015, 10/27/2016, 11/06/2017, 11/24/2019, 11/13/2020, 12/06/2022    Moderna Covid-19 Vaccine Bivalent Booster 65yrs & up 11/13/2020   Moderna Sars-Covid-2 Vaccination 03/23/2019, 04/20/2019, 11/24/2019, 05/02/2020   Pfizer Covid-19 Vaccine Bivalent Booster 20yrs & up 08/25/2021   Pfizer(Comirnaty)Fall Seasonal Vaccine 12 years and older 12/06/2022   Pneumococcal Conjugate-13 11/21/2015   Pneumococcal Polysaccharide-23 10/21/2017   Respiratory Syncytial Virus Vaccine,Recomb Aduvanted(Arexvy) 01/05/2022   Tdap 04/09/2015   Zoster Recombinant(Shingrix) 04/08/2021, 08/25/2021    TDAP status: Up to date  Flu Vaccine status: Up to date  Pneumococcal vaccine status: Up to date  Covid-19 vaccine status: Completed vaccines  Qualifies for Shingles Vaccine? Yes   Zostavax completed No   Shingrix Completed?: Yes  Screening Tests Health Maintenance  Topic Date Due   Hepatitis C Screening  Never done   HEMOGLOBIN A1C  12/09/2022   Medicare Annual Wellness (AWV)  02/07/2023   COVID-19 Vaccine (8 - 2024-25 season) 03/03/2023 (Originally 01/31/2023)  Diabetic kidney evaluation - Urine ACR  06/08/2023   Diabetic kidney evaluation - eGFR measurement  09/22/2023   OPHTHALMOLOGY EXAM  01/11/2024   FOOT EXAM  01/19/2024   DTaP/Tdap/Td (2 - Td or Tdap) 04/08/2025   Pneumonia Vaccine 40+ Years old  Completed   INFLUENZA VACCINE  Completed   Zoster Vaccines- Shingrix  Completed   HPV VACCINES  Aged Out    Health Maintenance  Health Maintenance Due  Topic Date Due   Hepatitis C Screening  Never done   HEMOGLOBIN A1C  12/09/2022   Medicare Annual Wellness (AWV)  02/07/2023    Colorectal cancer screening: No longer required.   Lung Cancer Screening: (Low Dose CT Chest recommended if Age 36-80 years, 20 pack-year currently smoking OR have quit w/in 15years.) does not qualify.     Additional Screening:  Hepatitis C Screening: does qualify; Completed Patient declined  Vision Screening: Recommended annual ophthalmology exams for early detection of  glaucoma and other disorders of the eye. Is the patient up to date with their annual eye exam?  Yes  Who is the provider or what is the name of the office in which the patient attends annual eye exams? [facility] If pt is not established with a provider, would they like to be referred to a provider to establish care? No .   Dental Screening: Recommended annual dental exams for proper oral hygiene  Diabetic Foot Exam: Diabetic Foot Exam: Completed 01/2023  Community Resource Referral / Chronic Care Management: CRR required this visit?  No   CCM required this visit?  No     Plan:     I have personally reviewed and noted the following in the patient's chart:   Medical and social history Use of alcohol, tobacco or illicit drugs  Current medications and supplements including opioid prescriptions. Patient is currently taking opioid prescriptions. Information provided to patient regarding non-opioid alternatives. Patient advised to discuss non-opioid treatment plan with their provider. Functional ability and status Nutritional status Physical activity Advanced directives List of other physicians Hospitalizations, surgeries, and ER visits in previous 12 months Vitals Screenings to include cognitive, depression, and falls Referrals and appointments  In addition, I have reviewed and discussed with patient certain preventive protocols, quality metrics, and best practice recommendations. A written personalized care plan for preventive services as well as general preventive health recommendations were provided to patient.     Angeline Fredericks, LPN   [id number]   After Visit Summary: (Pick Up) Due to this being a telephonic visit, with patients personalized plan was offered to patient and patient has requested to Pick up at office.  Nurse Notes: None

## 2023-02-16 NOTE — Assessment & Plan Note (Signed)
 Occurring intermittently and bilateral.  Do suspect nerve impingement due to how he is sleeping.  Advised to adjust his sleeping position.  Advised if he develops constant hand numbness he needs to seek medical attention right away.

## 2023-02-16 NOTE — Assessment & Plan Note (Signed)
 Chronic issue.  Will refer for home health physical therapy.

## 2023-02-16 NOTE — Assessment & Plan Note (Signed)
 Chronic issue.  Adequately controlled for his age.  He will continue olmesartan 40 mg daily, HCTZ 25 mg daily, and carvedilol 25 mg twice daily.

## 2023-02-16 NOTE — Progress Notes (Signed)
 Alan Her, MD Phone: [phone]  Alan Schmidt is a 78 y.o. male who presents today for follow-up.  Hand numbness: Patient notes this has been going on intermittently for a little while.  Started in his left hand.  Noted his hand would be numb after waking up.  Notes it would resolve after changing positions.  Notes his right hand did this recently as well.  Does not have any numbness at any other time.  No weakness.  Balance difficulty: This has been going on for quite some time.  Notes a little bit of imbalance this morning.  He did physical therapy previously notes this was beneficial although they discontinued his physical therapy.  Hypertension: Patient notes blood pressure was elevated recently.  He is taking carvedilol , olmesartan , and HCTZ.  Has been eating salty soup.  Social History   Tobacco Use  Smoking Status Former   Types: Cigarettes  Smokeless Tobacco Never    Current Outpatient Medications on File Prior to Visit  Medication Sig Dispense Refill   ACCU-CHEK GUIDE test strip USE AS INSTRUCTED DX CODE: E11.9 100 strip 5   acetaminophen  (TYLENOL ) 500 MG tablet Take 500 mg by mouth every 6 (six) hours as needed.     ALPRAZolam  (XANAX ) 1 MG tablet TAKE 1/2 TABLET BY MOUTH 2 TIMES DAILY AS NEEDED FOR ANXIETY. 30 tablet 0   aspirin  EC 81 MG tablet Take 81 mg by mouth daily.     buPROPion  (WELLBUTRIN  XL) 150 MG 24 hr tablet TAKE 1 TABLET (150 MG TOTAL) BY MOUTH DAILY FOR 7 DAYS, THEN 2 TABLETS (300 MG TOTAL) DAILY. 180 tablet 0   busPIRone  (BUSPAR ) 10 MG tablet Take 1 tablet (10 mg total) by mouth 3 (three) times daily. 90 tablet 1   carvedilol  (COREG ) 25 MG tablet Take 1 tablet by mouth twice daily. 180 tablet 1   Continuous Blood Gluc Receiver (FREESTYLE LIBRE 14 DAY READER) DEVI 1 Device by Does not apply route daily. Use to scan to check blood sugar up to 7 times daily; E11.42, 1 Device 1   Continuous Glucose Sensor (FREESTYLE LIBRE 14 DAY SENSOR) MISC 1 Device by  Does not apply route every 14 (fourteen) days. E11.42 6 each 3   desoximetasone  (TOPICORT ) 0.25 % cream APPLY CREAM TO AFFECTED AREA TWO TIMES DAILY, FOR UP TO 7 DAYS, DO NOT APPLY TO FACE   Strength: 0.25 % 30 g 0   empagliflozin  (JARDIANCE ) 25 MG TABS tablet Take 1 tablet (25 mg total) by mouth daily. 90 tablet 0   EPINEPHrine  (EPIPEN  2-PAK) 0.3 mg/0.3 mL IJ SOAJ injection Inject 0.3 mg into the muscle as needed for anaphylaxis. 2 each 0   escitalopram  (LEXAPRO ) 20 MG tablet Take 1 tablet by mouth daily. 90 tablet 0   gabapentin  (NEURONTIN ) 250 MG/5ML solution Take 6 ml by mouth at bedtime. 180 mL 1   hydrochlorothiazide  (HYDRODIURIL ) 25 MG tablet Take 1 tablet by mouth daily. 90 tablet 0   insulin  degludec (TRESIBA  FLEXTOUCH) 100 UNIT/ML FlexTouch Pen INJECT 13 UNITS UNDER THE SKIN DAILY 15 mL 1   insulin  lispro (HUMALOG  KWIKPEN) 100 UNIT/ML KwikPen INJECT 5 TO 7 UNITS UNDER THE SKIN THREE TIMES DAILY WITH MEALS 15 mL 1   Insulin  Pen Needle (BD PEN NEEDLE NANO U/F) 32G X 4 MM MISC USE EVERY DAY 100 each 4   loratadine  (CLARITIN ) 10 MG tablet Take 1 tablet by mouth daily. 30 tablet 10   metFORMIN  (GLUCOPHAGE ) 1000 MG tablet Take 1 tablet  by mouth twice daily with a meal. 180 tablet 0   methocarbamol  (ROBAXIN ) 500 MG tablet Take 500 mg by mouth 2 (two) times daily.     mometasone  (NASONEX ) 50 MCG/ACT nasal spray USE 2 SPRAYS INTO EACH NOSTRIL ONCE DAILY 34 each 1   morphine  (MS CONTIN ) 15 MG 12 hr tablet Take 1 tablet (15 mg total) by mouth every 8 (eight) hours. Must last 30 days. Do not break tablet 90 tablet 0   [START ON 03/06/2023] morphine  (MS CONTIN ) 15 MG 12 hr tablet Take 1 tablet (15 mg total) by mouth every 8 (eight) hours. Must last 30 days. Do not break tablet 90 tablet 0   naloxone  (NARCAN ) nasal spray 4 mg/0.1 mL Place 1 spray into the nose as needed for up to 365 doses (for opioid-induced respiratory depresssion). In case of emergency (overdose), spray once into each nostril. If no  response within 3 minutes, repeat application and call 911. 1 each 0   olmesartan  (BENICAR ) 40 MG tablet Take 1 tablet by mouth daily. 90 tablet 0   pantoprazole  (PROTONIX ) 40 MG tablet Take 1 tablet by mouth daily. 90 tablet 0   rosuvastatin  (CRESTOR ) 40 MG tablet Take 1 tablet by mouth daily. 90 tablet 3   sennosides-docusate sodium (SENOKOT-S) 8.6-50 MG tablet Take 1 tablet by mouth daily as needed.      tamsulosin  (FLOMAX ) 0.4 MG CAPS capsule Take 1 capsule by mouth twice daily. 180 capsule 0   tirzepatide  (MOUNJARO ) 7.5 MG/0.5ML Pen INJECT 7.5 MG SUBCUTANEOUSLY WEEKLY 2 mL 2   lubiprostone  (AMITIZA ) 8 MCG capsule TAKE 1 CAPSULE (8 MCG TOTAL) BY MOUTH 2 (TWO) TIMES DAILY WITH A MEAL. SWALLOW THE MEDICATION WHOLE. DO NOT BREAK OR CHEW THE MEDICATION. 180 capsule 1   morphine  (MS CONTIN ) 15 MG 12 hr tablet Take 1 tablet (15 mg total) by mouth every 8 (eight) hours. Must last 30 days. Do not break tablet 90 tablet 0   Wheat Dextrin (BENEFIBER) POWD Take 6 g by mouth 3 (three) times daily before meals. (2 tsp = 6 g) 730 g 2   No current facility-administered medications on file prior to visit.     ROS see history of present illness  Objective  Physical Exam Vitals:   02/16/23 1109  BP: 130/82  Pulse: 75  Temp: (!) 97.5 F (36.4 C)  SpO2: 99%    BP Readings from Last 3 Encounters:  02/16/23 130/82  02/05/23 (!) 151/94  12/28/22 (!) 164/88   Wt Readings from Last 3 Encounters:  02/16/23 249 lb (112.9 kg)  02/05/23 244 lb (110.7 kg)  12/28/22 255 lb (115.7 kg)    Physical Exam Constitutional:      General: He is not in acute distress.    Appearance: He is not diaphoretic.  Pulmonary:     Effort: Pulmonary effort is normal.  Skin:    General: Skin is warm and dry.  Neurological:     Mental Status: He is alert.     Comments: 5/5 strength in bilateral biceps, triceps, grip, quads, hamstrings, plantar and dorsiflexion, sensation to light touch intact in bilateral UE and LE       Assessment/Plan: Please see individual problem list.  Essential hypertension Assessment & Plan: Chronic issue.  Adequately controlled for his age.  He will continue olmesartan  40 mg daily, HCTZ 25 mg daily, and carvedilol  25 mg twice daily.   Balance problem Assessment & Plan: Chronic issue.  Will refer for home health physical  therapy.  Orders: -     Ambulatory referral to Home Health  Hand numbness Assessment & Plan: Occurring intermittently and bilateral.  Do suspect nerve impingement due to how he is sleeping.  Advised to adjust his sleeping position.  Advised if he develops constant hand numbness he needs to seek medical attention right away.     Return in about 4 weeks (around 03/16/2023) for BP follow-up, 3 months transfer of care.   Alan Her, MD [facility]

## 2023-02-20 ENCOUNTER — Other Ambulatory Visit: Payer: Self-pay | Admitting: Family Medicine

## 2023-02-23 ENCOUNTER — Other Ambulatory Visit: Payer: Self-pay | Admitting: Family Medicine

## 2023-02-23 DIAGNOSIS — M6281 Muscle weakness (generalized): Secondary | ICD-10-CM | POA: Diagnosis not present

## 2023-02-23 DIAGNOSIS — I110 Hypertensive heart disease with heart failure: Secondary | ICD-10-CM | POA: Diagnosis not present

## 2023-02-23 DIAGNOSIS — I509 Heart failure, unspecified: Secondary | ICD-10-CM | POA: Diagnosis not present

## 2023-02-23 DIAGNOSIS — Z7984 Long term (current) use of oral hypoglycemic drugs: Secondary | ICD-10-CM | POA: Diagnosis not present

## 2023-02-23 DIAGNOSIS — E119 Type 2 diabetes mellitus without complications: Secondary | ICD-10-CM | POA: Diagnosis not present

## 2023-02-23 DIAGNOSIS — H9193 Unspecified hearing loss, bilateral: Secondary | ICD-10-CM | POA: Diagnosis not present

## 2023-02-23 DIAGNOSIS — Z96641 Presence of right artificial hip joint: Secondary | ICD-10-CM | POA: Diagnosis not present

## 2023-02-23 DIAGNOSIS — Z9181 History of falling: Secondary | ICD-10-CM | POA: Diagnosis not present

## 2023-02-23 DIAGNOSIS — I2510 Atherosclerotic heart disease of native coronary artery without angina pectoris: Secondary | ICD-10-CM | POA: Diagnosis not present

## 2023-02-23 DIAGNOSIS — Z794 Long term (current) use of insulin: Secondary | ICD-10-CM | POA: Diagnosis not present

## 2023-02-23 DIAGNOSIS — E1142 Type 2 diabetes mellitus with diabetic polyneuropathy: Secondary | ICD-10-CM

## 2023-02-23 DIAGNOSIS — Z7985 Long-term (current) use of injectable non-insulin antidiabetic drugs: Secondary | ICD-10-CM | POA: Diagnosis not present

## 2023-02-23 DIAGNOSIS — M5416 Radiculopathy, lumbar region: Secondary | ICD-10-CM | POA: Diagnosis not present

## 2023-02-23 DIAGNOSIS — M1990 Unspecified osteoarthritis, unspecified site: Secondary | ICD-10-CM | POA: Diagnosis not present

## 2023-02-23 DIAGNOSIS — N289 Disorder of kidney and ureter, unspecified: Secondary | ICD-10-CM | POA: Diagnosis not present

## 2023-02-23 DIAGNOSIS — R262 Difficulty in walking, not elsewhere classified: Secondary | ICD-10-CM | POA: Diagnosis not present

## 2023-02-24 NOTE — Addendum Note (Signed)
Addended by: Glori Luis on: 02/24/2023 02:53 PM   Modules accepted: Orders

## 2023-02-26 ENCOUNTER — Telehealth: Payer: Self-pay

## 2023-02-26 NOTE — Progress Notes (Signed)
Care Guide Pharmacy Note  02/26/2023 Name: KENYADA HY MRN: [id number] DOB: Mar 16, 1945  Referred By: Glori Luis, MD Reason for referral: Care Coordination (TNM Diabetes. )   NAVIN DOGAN is a 78 y.o. year old male who is a primary care patient of Birdie Sons, Yehuda Mao, MD.  [name] was referred to the pharmacist for assistance related to: DMII  An unsuccessful telephone outreach was attempted today to contact the patient who was referred to the pharmacy team for assistance with diabetes. Additional attempts will be made to contact the patient.  [facility]  [facility], [facility] Health Care Management Assistant Direct Dial: [phone]  Fax: [phone]

## 2023-03-02 ENCOUNTER — Telehealth: Payer: Self-pay

## 2023-03-02 ENCOUNTER — Encounter: Payer: Self-pay | Admitting: Family Medicine

## 2023-03-02 NOTE — Progress Notes (Signed)
Care Guide Pharmacy Note  03/02/2023 Name: Alan Schmidt MRN: [id number] DOB: 06/16/1945  Referred By: Glori Luis, MD Reason for referral: Care Coordination (TNM Diabetes. )   Alan Schmidt is a 78 y.o. year old male who is a primary care patient of Birdie Sons, Yehuda Mao, MD.  Alan Schmidt was referred to the pharmacist for assistance related to: DMII  A second unsuccessful telephone outreach was attempted today to contact the patient who was referred to the pharmacy team for assistance with diabetes. Additional attempts will be made to contact the patient.  [facility]  [facility], [facility] Health Care Management Assistant Direct Dial: [phone]  Fax: [phone]

## 2023-03-03 ENCOUNTER — Other Ambulatory Visit: Payer: Self-pay

## 2023-03-03 ENCOUNTER — Telehealth: Payer: Self-pay

## 2023-03-03 MED ORDER — MOMETASONE FUROATE 50 MCG/ACT NA SUSP: NASAL | 1 refills | Status: DC

## 2023-03-03 NOTE — Progress Notes (Signed)
Care Guide Pharmacy Note  03/03/2023 Name: Alan Schmidt MRN: [id number] DOB: May 14, 1945  Referred By: Glori Luis, MD Reason for referral: Care Coordination (TNM Diabetes. )   Alan Schmidt is a 78 y.o. year old male who is a primary care patient of Birdie Sons, Yehuda Mao, MD.  [name] was referred to the pharmacist for assistance related to: DMII  Successful contact was made with the patient to discuss pharmacy services.  Patient declines engagement at this time. Contact information was provided to the patient should they wish to reach out for assistance at a later time.  [facility]  [place], [facility] Health Care Management Assistant Direct Dial: [phone]  Fax: [phone]

## 2023-03-05 ENCOUNTER — Other Ambulatory Visit: Payer: Self-pay | Admitting: Psychiatry

## 2023-03-05 DIAGNOSIS — Z9181 History of falling: Secondary | ICD-10-CM | POA: Diagnosis not present

## 2023-03-05 DIAGNOSIS — M6281 Muscle weakness (generalized): Secondary | ICD-10-CM | POA: Diagnosis not present

## 2023-03-05 DIAGNOSIS — Z7984 Long term (current) use of oral hypoglycemic drugs: Secondary | ICD-10-CM | POA: Diagnosis not present

## 2023-03-05 DIAGNOSIS — Z7985 Long-term (current) use of injectable non-insulin antidiabetic drugs: Secondary | ICD-10-CM | POA: Diagnosis not present

## 2023-03-05 DIAGNOSIS — I2510 Atherosclerotic heart disease of native coronary artery without angina pectoris: Secondary | ICD-10-CM | POA: Diagnosis not present

## 2023-03-05 DIAGNOSIS — E119 Type 2 diabetes mellitus without complications: Secondary | ICD-10-CM | POA: Diagnosis not present

## 2023-03-05 DIAGNOSIS — Z794 Long term (current) use of insulin: Secondary | ICD-10-CM | POA: Diagnosis not present

## 2023-03-05 DIAGNOSIS — I110 Hypertensive heart disease with heart failure: Secondary | ICD-10-CM | POA: Diagnosis not present

## 2023-03-05 DIAGNOSIS — M1990 Unspecified osteoarthritis, unspecified site: Secondary | ICD-10-CM | POA: Diagnosis not present

## 2023-03-05 DIAGNOSIS — I509 Heart failure, unspecified: Secondary | ICD-10-CM | POA: Diagnosis not present

## 2023-03-05 DIAGNOSIS — R262 Difficulty in walking, not elsewhere classified: Secondary | ICD-10-CM | POA: Diagnosis not present

## 2023-03-05 DIAGNOSIS — Z96641 Presence of right artificial hip joint: Secondary | ICD-10-CM | POA: Diagnosis not present

## 2023-03-05 DIAGNOSIS — N289 Disorder of kidney and ureter, unspecified: Secondary | ICD-10-CM | POA: Diagnosis not present

## 2023-03-05 DIAGNOSIS — H9193 Unspecified hearing loss, bilateral: Secondary | ICD-10-CM | POA: Diagnosis not present

## 2023-03-05 DIAGNOSIS — F411 Generalized anxiety disorder: Secondary | ICD-10-CM

## 2023-03-05 DIAGNOSIS — M5416 Radiculopathy, lumbar region: Secondary | ICD-10-CM | POA: Diagnosis not present

## 2023-03-09 ENCOUNTER — Telehealth: Payer: Self-pay

## 2023-03-09 DIAGNOSIS — E119 Type 2 diabetes mellitus without complications: Secondary | ICD-10-CM

## 2023-03-09 DIAGNOSIS — M5416 Radiculopathy, lumbar region: Secondary | ICD-10-CM

## 2023-03-09 DIAGNOSIS — M6281 Muscle weakness (generalized): Secondary | ICD-10-CM

## 2023-03-09 DIAGNOSIS — N289 Disorder of kidney and ureter, unspecified: Secondary | ICD-10-CM

## 2023-03-09 DIAGNOSIS — H9193 Unspecified hearing loss, bilateral: Secondary | ICD-10-CM

## 2023-03-09 DIAGNOSIS — M1990 Unspecified osteoarthritis, unspecified site: Secondary | ICD-10-CM

## 2023-03-09 DIAGNOSIS — I2510 Atherosclerotic heart disease of native coronary artery without angina pectoris: Secondary | ICD-10-CM

## 2023-03-09 DIAGNOSIS — I509 Heart failure, unspecified: Secondary | ICD-10-CM

## 2023-03-09 DIAGNOSIS — I110 Hypertensive heart disease with heart failure: Secondary | ICD-10-CM

## 2023-03-09 DIAGNOSIS — Z794 Long term (current) use of insulin: Secondary | ICD-10-CM

## 2023-03-09 DIAGNOSIS — R262 Difficulty in walking, not elsewhere classified: Secondary | ICD-10-CM

## 2023-03-09 DIAGNOSIS — Z7984 Long term (current) use of oral hypoglycemic drugs: Secondary | ICD-10-CM

## 2023-03-09 NOTE — Telephone Encounter (Signed)
Patient called checking status of Shoes, I called him back and LM. He needs a fitting appt and was a no show in [facility] on 12/27 shoes are in [facility] documents are now expired will will send to Dr to have them re-dated

## 2023-03-11 ENCOUNTER — Telehealth: Payer: Self-pay

## 2023-03-11 NOTE — Telephone Encounter (Signed)
 Copied from CRM [id number]. Topic: General - Other >> Mar 11, 2023  1:59 PM Pinkey ORN wrote: Reason for CRM: Clinical Notes Request >> Mar 11, 2023  2:04 PM Pinkey ORN wrote: Alan Schmidt [phone] , Patient Account # [id number] Called on behalf of the patient, states a faxed request for the patients clinical notes in regards to his diabetes was never received.

## 2023-03-12 DIAGNOSIS — M6281 Muscle weakness (generalized): Secondary | ICD-10-CM | POA: Diagnosis not present

## 2023-03-12 DIAGNOSIS — Z96641 Presence of right artificial hip joint: Secondary | ICD-10-CM | POA: Diagnosis not present

## 2023-03-12 DIAGNOSIS — I509 Heart failure, unspecified: Secondary | ICD-10-CM | POA: Diagnosis not present

## 2023-03-12 DIAGNOSIS — E119 Type 2 diabetes mellitus without complications: Secondary | ICD-10-CM | POA: Diagnosis not present

## 2023-03-12 DIAGNOSIS — H9193 Unspecified hearing loss, bilateral: Secondary | ICD-10-CM | POA: Diagnosis not present

## 2023-03-12 DIAGNOSIS — Z9181 History of falling: Secondary | ICD-10-CM | POA: Diagnosis not present

## 2023-03-12 DIAGNOSIS — Z7984 Long term (current) use of oral hypoglycemic drugs: Secondary | ICD-10-CM | POA: Diagnosis not present

## 2023-03-12 DIAGNOSIS — Z7985 Long-term (current) use of injectable non-insulin antidiabetic drugs: Secondary | ICD-10-CM | POA: Diagnosis not present

## 2023-03-12 DIAGNOSIS — M5416 Radiculopathy, lumbar region: Secondary | ICD-10-CM | POA: Diagnosis not present

## 2023-03-12 DIAGNOSIS — Z794 Long term (current) use of insulin: Secondary | ICD-10-CM | POA: Diagnosis not present

## 2023-03-12 DIAGNOSIS — I2510 Atherosclerotic heart disease of native coronary artery without angina pectoris: Secondary | ICD-10-CM | POA: Diagnosis not present

## 2023-03-12 DIAGNOSIS — M1990 Unspecified osteoarthritis, unspecified site: Secondary | ICD-10-CM | POA: Diagnosis not present

## 2023-03-12 DIAGNOSIS — N289 Disorder of kidney and ureter, unspecified: Secondary | ICD-10-CM | POA: Diagnosis not present

## 2023-03-12 DIAGNOSIS — R262 Difficulty in walking, not elsewhere classified: Secondary | ICD-10-CM | POA: Diagnosis not present

## 2023-03-12 DIAGNOSIS — I110 Hypertensive heart disease with heart failure: Secondary | ICD-10-CM | POA: Diagnosis not present

## 2023-03-15 NOTE — Telephone Encounter (Signed)
 Chart notes sent via e-fax.

## 2023-03-17 ENCOUNTER — Ambulatory Visit: Payer: 59 | Admitting: Family Medicine

## 2023-03-18 DIAGNOSIS — E118 Type 2 diabetes mellitus with unspecified complications: Secondary | ICD-10-CM | POA: Diagnosis not present

## 2023-03-18 DIAGNOSIS — Z794 Long term (current) use of insulin: Secondary | ICD-10-CM | POA: Diagnosis not present

## 2023-03-18 DIAGNOSIS — E1165 Type 2 diabetes mellitus with hyperglycemia: Secondary | ICD-10-CM | POA: Diagnosis not present

## 2023-03-19 ENCOUNTER — Other Ambulatory Visit: Payer: Self-pay | Admitting: Family Medicine

## 2023-03-19 ENCOUNTER — Encounter: Payer: 59 | Admitting: Family Medicine

## 2023-03-19 ENCOUNTER — Other Ambulatory Visit (INDEPENDENT_AMBULATORY_CARE_PROVIDER_SITE_OTHER): Payer: Self-pay | Admitting: Nurse Practitioner

## 2023-03-19 ENCOUNTER — Telehealth: Payer: Self-pay

## 2023-03-19 ENCOUNTER — Ambulatory Visit: Payer: Self-pay | Admitting: Family Medicine

## 2023-03-19 DIAGNOSIS — R600 Localized edema: Secondary | ICD-10-CM

## 2023-03-19 MED ORDER — ALPRAZOLAM 1 MG PO TABS: ORAL_TABLET | ORAL | 0 refills | Status: DC

## 2023-03-19 NOTE — Telephone Encounter (Signed)
Sent to pharmacy. If he continues to have low sugar levels he should let us know and he should reach out to his endocrinologist to get their input.

## 2023-03-19 NOTE — Telephone Encounter (Signed)
Left message for patient to make him aware of Dr Isabella Bowens recommendations.   OK to relay message if patient calls back. If relayed, please notify the office.

## 2023-03-19 NOTE — Telephone Encounter (Signed)
Copied from CRM [id number]. Topic: Clinical - Red Word Triage >> Mar 19, 2023  1:00 PM Chantha C wrote: Red Word that prompted transfer to Nurse Triage: Patient [phone] states ALPRAZolam Prudy Feeler) 1 MG tablet is out of medication for several days and is having side effects, anxiety is greater without the meds, sort of feeling twisted in knots. Patient denies self harm and suicidal thoughts.   Chief Complaint: Anxiety Symptoms: Worsened anxiety Pertinent Negatives: Patient denies suicidal thoughts Disposition: [] ED /[] Urgent Care (no appt availability in office) / [] Appointment(In office/virtual)/ []  [place] [facility]/ [] Home Care/ [] Refused Recommended Disposition /[] [place] [facility]/ [x]  Follow-up with PCP Additional Notes: Patient stated he has been having increased anxiety lately since being out of the Xanax medication. The refill request has already been sent in a different encounter today. Patient denies thoughts of harm or harming others. He is feeling overwhelmed and having difficulty sleeping so he is hoping to get the refill ASAP.  Patient also mentioned that he had a couple episodes of low blood sugar this past week where his blood sugar was in the 50s. Today, he checked his blood sugar and it was 183. Patient stated he takes his Insulin and Metformin as prescribed and he has an endocrinologist.   Answer Assessment - Initial Assessment Questions 1. ANXIETY SYMPTOMS: "Can you describe how you (your loved one; patient) have been feeling?" (e.g., tense, restless, panicky, anxious, keyed up, overwhelmed, sense of impending doom).      Difficulty sleeping, can't get a ahold of anxiety, overwhelmed   2. ONSET: "How long have you been feeling this way?" (e.g., hours, days, weeks)     Has hx of anxiety, but it has heightened since being off the medication for a few days   3. SEVERITY: "How would you rate the level of anxiety?" (e.g., 0 - 10; or mild, moderate, severe).     Anxiety  is constant and can feel severe at times, but patient feels that he keeping it moderate   4. FUNCTIONAL IMPAIRMENT: "How have these feelings affected your ability to do daily activities?" "Have you had more difficulty than usual doing your normal daily activities?" (e.g., getting better, same, worse; self-care, school, work, interactions)     Interferes with daily activities  5. HISTORY: "Have you felt this way before?" "Have you ever been diagnosed with an anxiety problem in the past?" (e.g., generalized anxiety disorder, panic attacks, PTSD). If Yes, ask: "How was this problem treated?" (e.g., medicines, counseling, etc.)     Yes, pt has hx of anxiety  6. RISK OF HARM - SUICIDAL IDEATION: "Do you ever have thoughts of hurting or killing yourself?" If Yes, ask:  "Do you have these feelings now?" "Do you have a plan on how you would do this?"     No   7. TREATMENT:  "What has been done so far to treat this anxiety?" (e.g., medicines, relaxation strategies). "What has helped?"     Patient takes Xanax, but has been out of it for a couple days  8. OTHER SYMPTOMS: "Do you have any other symptoms?" (e.g., feeling depressed, trouble concentrating, trouble sleeping, trouble breathing, palpitations or fast heartbeat, chest pain, sweating, nausea, or diarrhea)       No  Protocols used: Anxiety and Panic Attack-A-AH

## 2023-03-19 NOTE — Telephone Encounter (Signed)
Copied from CRM [id number]. Topic: Compliment - Provider (non-sensitive) >> Mar 19, 2023 12:50 PM Chantha C wrote: FYI: Patient just wanted to let Dr. Birdie Sons how much he appreciate him being his doctor and care. Patient is wishing you and the family a wonderful time and so grateful for all that you have done for me. It's really hard to lost you as a provider.

## 2023-03-19 NOTE — Telephone Encounter (Signed)
Copied from CRM [id number]. Topic: Clinical - Medication Refill >> Mar 19, 2023 12:49 PM Orinda Kenner C wrote: Most Recent Primary Care Visit:  Provider: Birdie Sons, ERIC G  Department: [facility]  Visit Type: OFFICE VISIT  Date: 02/16/2023  Medication: ALPRAZolam Prudy Feeler) 1 MG tablet  Has the patient contacted their pharmacy? Yes (Agent: If no, request that the patient contact the pharmacy for the refill. If patient does not wish to contact the pharmacy document the reason why and proceed with request.) (Agent: If yes, when and what did the pharmacy advise?)  Is this the correct pharmacy for this prescription? Yes If no, delete pharmacy and type the correct one.  This is the patient's preferred pharmacy:  [id number] - [address] [place] [address] Phone: [phone] Fax: [phone]  Has the prescription been filled recently? No  Is the patient out of the medication? Yes is out of medication for several days and is having side effects, anxiety is greater without the meds, sort of feeling twisted in knots. Patient denies self harm, nor suicidal thoughts.   Has the patient been seen for an appointment in the last year OR does the patient have an upcoming appointment? Yes  Can we respond through MyChart? No, call [phone]  Agent: Please be advised that Rx refills may take up to 3 business days. We ask that you follow-up with your pharmacy.

## 2023-03-22 ENCOUNTER — Other Ambulatory Visit: Payer: Self-pay | Admitting: Family Medicine

## 2023-03-22 ENCOUNTER — Telehealth: Payer: Self-pay

## 2023-03-22 DIAGNOSIS — Z794 Long term (current) use of insulin: Secondary | ICD-10-CM

## 2023-03-22 NOTE — Telephone Encounter (Signed)
Copied from CRM [id number]. Topic: General - Other >> Mar 22, 2023  2:24 PM Aletta Edouard wrote: Reason for CRM: patient is calling in regarding a call from the office there were no notes left in the patient chart he would like a call back regarding this

## 2023-03-22 NOTE — Telephone Encounter (Signed)
 LMTCB

## 2023-03-22 NOTE — Telephone Encounter (Signed)
LMTCB. SEE PREVIOUS PHONE NOTE IN CHART AND RELAY MESSAGE TO PATIENT. THANKS!

## 2023-03-23 ENCOUNTER — Ambulatory Visit (INDEPENDENT_AMBULATORY_CARE_PROVIDER_SITE_OTHER): Payer: 59 | Admitting: Nurse Practitioner

## 2023-03-23 ENCOUNTER — Encounter (INDEPENDENT_AMBULATORY_CARE_PROVIDER_SITE_OTHER): Payer: 59

## 2023-03-24 ENCOUNTER — Other Ambulatory Visit: Payer: Self-pay | Admitting: Psychiatry

## 2023-03-24 ENCOUNTER — Other Ambulatory Visit: Payer: Self-pay | Admitting: Family Medicine

## 2023-03-24 DIAGNOSIS — F411 Generalized anxiety disorder: Secondary | ICD-10-CM

## 2023-03-24 DIAGNOSIS — E1142 Type 2 diabetes mellitus with diabetic polyneuropathy: Secondary | ICD-10-CM

## 2023-03-29 ENCOUNTER — Telehealth: Payer: Self-pay

## 2023-03-29 NOTE — Telephone Encounter (Signed)
 Patient was identified as falling into the [place] Measure - Diabetes.   Patient was: Appointment scheduled with primary care provider in the next 30 days.   Patient has an appointment with Dr. Clent Ridges 04/02/23

## 2023-03-30 ENCOUNTER — Ambulatory Visit: Payer: 59 | Admitting: Psychiatry

## 2023-03-30 DIAGNOSIS — I2510 Atherosclerotic heart disease of native coronary artery without angina pectoris: Secondary | ICD-10-CM | POA: Diagnosis not present

## 2023-03-30 DIAGNOSIS — Z9181 History of falling: Secondary | ICD-10-CM | POA: Diagnosis not present

## 2023-03-30 DIAGNOSIS — Z7985 Long-term (current) use of injectable non-insulin antidiabetic drugs: Secondary | ICD-10-CM | POA: Diagnosis not present

## 2023-03-30 DIAGNOSIS — M6281 Muscle weakness (generalized): Secondary | ICD-10-CM | POA: Diagnosis not present

## 2023-03-30 DIAGNOSIS — M1990 Unspecified osteoarthritis, unspecified site: Secondary | ICD-10-CM | POA: Diagnosis not present

## 2023-03-30 DIAGNOSIS — E119 Type 2 diabetes mellitus without complications: Secondary | ICD-10-CM | POA: Diagnosis not present

## 2023-03-30 DIAGNOSIS — I110 Hypertensive heart disease with heart failure: Secondary | ICD-10-CM | POA: Diagnosis not present

## 2023-03-30 DIAGNOSIS — I509 Heart failure, unspecified: Secondary | ICD-10-CM | POA: Diagnosis not present

## 2023-03-30 DIAGNOSIS — Z794 Long term (current) use of insulin: Secondary | ICD-10-CM | POA: Diagnosis not present

## 2023-03-30 DIAGNOSIS — R262 Difficulty in walking, not elsewhere classified: Secondary | ICD-10-CM | POA: Diagnosis not present

## 2023-03-30 DIAGNOSIS — Z7984 Long term (current) use of oral hypoglycemic drugs: Secondary | ICD-10-CM | POA: Diagnosis not present

## 2023-03-30 DIAGNOSIS — Z96641 Presence of right artificial hip joint: Secondary | ICD-10-CM | POA: Diagnosis not present

## 2023-03-30 DIAGNOSIS — N289 Disorder of kidney and ureter, unspecified: Secondary | ICD-10-CM | POA: Diagnosis not present

## 2023-03-30 DIAGNOSIS — M5416 Radiculopathy, lumbar region: Secondary | ICD-10-CM | POA: Diagnosis not present

## 2023-03-30 DIAGNOSIS — H9193 Unspecified hearing loss, bilateral: Secondary | ICD-10-CM | POA: Diagnosis not present

## 2023-03-30 NOTE — Patient Instructions (Incomplete)

## 2023-03-30 NOTE — Progress Notes (Unsigned)
 Patient: Alan Schmidt  Service Category: E/M  Provider: Oswaldo Done, MD  DOB: [birth year]  DOS: 03/31/2023  Referring Provider: Glori Luis, MD  MRN: [id number]  Specialty: Interventional Pain Management  PCP: Glori Luis, MD  Type: Established Patient  Setting: Ambulatory outpatient    Location: Office  Delivery: Face-to-face     HPI  [name], a 78 y.o. year old male, is here today because of his Chronic pain syndrome [G89.4]. Mr. Riches primary complain today is Back Pain (middle)  Pertinent problems: Mr. Mcentee has Chronic low back pain (Bilateral) (R>L) w/ sciatica (Bilateral); Lumbar facet syndrome (Bilateral) (R>L); Lumbar spondylosis; Diabetic peripheral neuropathy (HCC); Chronic neck pain (midline over the C7 spinous processes) (L>R); Neurogenic pain; Neuropathic pain; Chronic lower extremity pain (2ry area of Pain) (Bilateral) (R>L); 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) (Right L5 dermatome); History of total hip replacement (Right); Chronic hip pain (Right); Abnormal nerve conduction studies (severe bilateral lower extremity polyneuropathy); Chronic shoulder pain (Bilateral) (R>L); Occipital neuralgia (Left); Cervicogenic headache (Left); Chronic shoulder radicular pain (Left); Chronic cervical radicular pain (Left); Chronic pain syndrome; Lumbar facet joint osteoarthritis (Bilateral); Chronic headache; Chronic tension-type headache, intractable; Coccygodynia; DDD (degenerative disc disease), lumbar; Chronic musculoskeletal pain; Chronic knee pain (Bilateral); Thumb pain (Right); 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); Spondylosis without myelopathy or radiculopathy, lumbosacral region; Osteoarthritis involving multiple joints; Shortened hamstring muscle; Chronic low back pain (1ry area of Pain) (Bilateral) (R>L) w/o sciatica; DDD (degenerative disc disease), cervical; Bilateral lower extremity edema; Bilateral leg  weakness; Musculoskeletal chest pain; Abnormal MRI, lumbar spine (06/02/2022); Trigger point of shoulder region (Left); Musculoskeletal disorder involving upper trapezius muscle (Left); Left thigh pain; Chronic hip pain (Left); Impaired range of motion of hip (Left); Physical deconditioning; and Spinal stenosis, lumbar region, 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 Pain Assessment: Severity of Chronic pain is reported as a 8 /10. Location: Back Mid, Lower/denies. Onset: More than a month ago. Quality: Aching, Throbbing. Timing: Constant. Modifying factor(s): Morphine, Advil,  rest. Vitals:  height is 5\' 11"  (1.803 m) and weight is 249 lb (112.9 kg). His temporal temperature is 97.6 F (36.4 C). His blood pressure is 136/74 and his pulse is 77. His respiration is 18 and oxygen saturation is 97%.  BMI: Estimated body mass index is 34.73 kg/m as calculated from the following:   Height as of this encounter: 5\' 11"  (1.803 m).   Weight as of this encounter: 249 lb (112.9 kg). Last encounter: 12/28/2022. Last procedure: 04/14/2022.  Reason for encounter: medication management.  The patient indicates doing well with the current medication regimen. No adverse reactions or side effects reported to the medications.   The patient comes in today indicating recurrence of his bilateral low back pain.  He denies any lower extremity pain, numbness, or weakness.  He is still positive for reproduction of the pain on hyperextension and rotation of the lumbar spine indicating a positive Kemp maneuver (positive lumbar facet joint pain).  His last treatment with therapeutic bilateral lumbar facet medial branch blocks was conducted on 04/14/2022, almost 1 year ago, and it did provide him with good relief of the pain until recently where it started to again flareup.  He has requested to have this procedure repeated.  RTCB: 07/04/2023   Pharmacotherapy Assessment  Analgesic: Morphine ER  (MS Contin) 30 mg, 1 tab p.o. twice daily (60 mg/day).  (Previously on oxycodone IR 10 mg every 6 hours (  40 mg/day)) MME/day: 60 mg/day.   Monitoring: [place] PMP: PDMP reviewed during this encounter.       Pharmacotherapy: No side-effects or adverse reactions reported. Compliance: No problems identified. Effectiveness: Clinically acceptable.  Concepcion Elk, RN  03/31/2023  3:13 PM  Sign when Signing Visit Nursing Pain Medication Assessment:  Safety precautions to be maintained throughout the outpatient stay will include: orient to surroundings, keep bed in low position, maintain call bell within reach at all times, provide assistance with transfer out of bed and ambulation.  Medication Inspection Compliance: Pill count conducted under aseptic conditions, in front of the patient. Neither the pills nor the bottle was removed from the patient's sight at any time. Once count was completed pills were immediately returned to the patient in their original bottle.  Medication: Morphine IR Pill/Patch Count:  73 of 90 pills remain Pill/Patch Appearance: Markings consistent with prescribed medication Bottle Appearance: Standard pharmacy container. Clearly labeled. Filled Date: 02 / 19 / 2025 Last Medication intake:  Today    No results found for: "CBDTHCR" No results found for: "D8THCCBX" No results found for: "D9THCCBX"  UDS:  Summary  Date Value Ref Range Status  07/08/2022 Note  Final    Comment:    ==================================================================== [employer] Select 13 (MW) ==================================================================== Test                             Result       Flag       Units  Drug Present and Declared for Prescription Verification   Alprazolam                     58           EXPECTED   ng/mg creat   Alpha-hydroxyalprazolam        142          EXPECTED   ng/mg creat    Source of alprazolam is a scheduled prescription medication. Alpha-     hydroxyalprazolam is an expected metabolite of alprazolam.    Morphine                       15430        EXPECTED   ng/mg creat   Normorphine                    570          EXPECTED   ng/mg creat    Potential sources of large amounts of morphine in the absence of    codeine include administration of morphine or use of heroin.     Normorphine is an expected metabolite of morphine.    Hydromorphone                  362          EXPECTED   ng/mg creat    Hydromorphone may be present as a metabolite of morphine;    concentrations of hydromorphone rarely exceed 5% of the morphine    concentration when this is the source of hydromorphone.  ==================================================================== Test                      Result    Flag   Units      Ref Range   Creatinine              50  mg/dL      >=14 ==================================================================== Declared Medications:  The flagging and interpretation on this report are based on the  following declared medications.  Unexpected results may arise from  inaccuracies in the declared medications.   **Note: The testing scope of this panel includes these medications:   Alprazolam  Morphine (MS Contin)   **Note: The testing scope of this panel does not include the  following reported medications:   Acetaminophen (Tylenol)  Aspirin  Bupropion (Wellbutrin XL)  Carvedilol (Coreg)  Desoximetasone (Topicort)  Empagliflozin (Jardiance)  Epinephrine (EpiPen)  Escitalopram (Lexapro)  Gabapentin (Neurontin)  Hydrochlorothiazide  Insulin Evaristo Bury)  Loratadine (Claritin)  Lubiprostone (Amitiza)  Metformin  Methocarbamol  Mometasone (Nasonex)  Naloxone (Narcan)  Olmesartan (Benicar)  Pantoprazole (Protonix)  Rosuvastatin (Crestor)  Supplement (Fiber)  Tamsulosin (Flomax)  Tirzepatide (Mounjaro) ==================================================================== For clinical  consultation, please call [phone]. ====================================================================       ROS  Constitutional: Denies any fever or chills Gastrointestinal: No reported hemesis, hematochezia, vomiting, or acute GI distress Musculoskeletal: Denies any acute onset joint swelling, redness, loss of ROM, or weakness Neurological: No reported episodes of acute onset apraxia, aphasia, dysarthria, agnosia, amnesia, paralysis, loss of coordination, or loss of consciousness  Medication Review  ALPRAZolam, Benefiber, EPINEPHrine, FreeStyle Libre 14 Day Reader, FreeStyle Libre 14 Day Sensor, Insulin Pen Needle, acetaminophen, aspirin EC, buPROPion, busPIRone, carvedilol, desoximetasone, empagliflozin, escitalopram, gabapentin, glucose blood, hydrochlorothiazide, insulin degludec, insulin lispro, loratadine, lubiprostone, metFORMIN, methocarbamol, mometasone, morphine, naloxone, olmesartan, pantoprazole, rosuvastatin, sennosides-docusate sodium, tamsulosin, and tirzepatide  History Review  Allergy: Mr. Banton is allergic to contrast media [iodinated contrast media], iodine, and shellfish allergy. Drug: Mr. Anglin  reports no history of drug use. Alcohol:  reports no history of alcohol use. Tobacco:  reports that he has quit smoking. His smoking use included cigarettes. He has never used smokeless tobacco. Social: Mr. Mckeithan  reports that he has quit smoking. His smoking use included cigarettes. He has never used smokeless tobacco. He reports that he does not drink alcohol and does not use drugs. Medical:  has a past medical history of Acute postoperative pain (11/24/2016), Anxiety, Chronic hip pain (Right) (12/05/2014), Chronic lumbar pain, Depression, Diabetes mellitus without complication (HCC), Hyperlipidemia, Hypertension, Kidney stones, and Migraines. Surgical: Mr. Cerami  has a past surgical history that includes Total hip arthroplasty; Tonsillectomy; and right hip  surgery. Family: family history includes Cancer in his mother; Diabetes in his father, sister, and sister; Heart disease in his father; Stroke in his father.  Laboratory Chemistry Profile   Renal Lab Results  Component Value Date   BUN 8 09/22/2022   CREATININE 0.85 09/22/2022   BCR NOT APPLICABLE 04/25/2021   GFR 84.35 09/22/2022   GFRAA >60 03/26/2018   GFRNONAA >60 03/26/2018    Hepatic Lab Results  Component Value Date   AST 21 09/22/2022   ALT 20 09/22/2022   ALBUMIN 3.9 09/22/2022   ALKPHOS 83 09/22/2022    Electrolytes Lab Results  Component Value Date   NA 140 09/22/2022   K 3.8 09/22/2022   CL 99 09/22/2022   CALCIUM 9.2 09/22/2022   MG 1.8 03/06/2015    Bone No results found for: "VD25OH", "VD125OH2TOT", "QM5784ON6", "EX5284XL2", "25OHVITD1", "25OHVITD2", "25OHVITD3", "TESTOFREE", "TESTOSTERONE"  Inflammation (CRP: Acute Phase) (ESR: Chronic Phase) Lab Results  Component Value Date   CRP 0.5 03/06/2015   ESRSEDRATE 45 (H) 12/14/2016         Note: Above Lab results reviewed.  Recent Imaging Review  DG HIP UNILAT W  OR W/O PELVIS 2-3 VIEWS LEFT CLINICAL DATA:  Left hip pain, left thigh pain  EXAM: DG HIP (WITH OR WITHOUT PELVIS) 2-3V LEFT  COMPARISON:  None Available.  FINDINGS: No acute fracture or dislocation. No aggressive osseous lesion. Normal alignment. Generalized osteopenia. Right total hip arthroplasty without hardware failure or complication. Mild osteoarthritis of the left hip. Osseous prominence of the superior femoral head-neck junction.  Soft tissue are unremarkable. No radiopaque foreign body or soft tissue emphysema.  IMPRESSION: 1. Right total hip arthroplasty. 2. Mild osteoarthritis of the left hip.  Electronically Signed   By: Elige Ko M.D.   On: 10/24/2022 15:00 Note: Reviewed        Physical Exam  General appearance: Well nourished, well developed, and well hydrated. In no apparent acute distress Mental status:  Alert, oriented x 3 (person, place, & time)       Respiratory: No evidence of acute respiratory distress Eyes: PERLA Vitals: BP 136/74   Pulse 77   Temp 97.6 F (36.4 C) (Temporal)   Resp 18   Ht 5\' 11"  (1.803 m)   Wt 249 lb (112.9 kg)   SpO2 97%   BMI 34.73 kg/m  BMI: Estimated body mass index is 34.73 kg/m as calculated from the following:   Height as of this encounter: 5\' 11"  (1.803 m).   Weight as of this encounter: 249 lb (112.9 kg). Ideal: Ideal body weight: 75.3 kg (166 lb 0.1 oz) Adjusted ideal body weight: 90.4 kg (199 lb 3.3 oz)  Assessment   Diagnosis Status  1. Chronic pain syndrome   2. Chronic low back pain (1ry area of Pain) (Bilateral) w/o sciatica   3. Chronic lower extremity pain (2ry area of Pain) (Bilateral) (R>L)   4. DDD (degenerative disc disease), cervical   5. Degeneration of intervertebral disc of lumbar region with discogenic back pain and lower extremity pain   6. Chronic neck pain (midline over the C7 spinous processes) (L>R)   7. Lumbar facet syndrome (Bilateral) (R>L)   8. Chronic knee pain (Bilateral)   9. 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 Pharmacologic therapy   11. Chronic use of opiate for therapeutic purpose   12. Encounter for medication management   13. Encounter for chronic pain management   14. Lumbar facet joint osteoarthritis (Bilateral)   15. Spondylosis without myelopathy or radiculopathy, lumbosacral region    Controlled Controlled Controlled   Updated Problems: No problems updated.  Plan of Care  Problem-specific:  Assessment and Plan           [name] has a current medication list which includes the following long-term medication(s): bupropion, carvedilol, escitalopram, gabapentin, hydrochlorothiazide, insulin lispro, loratadine, metformin, mometasone, naloxone, olmesartan, pantoprazole, rosuvastatin, lubiprostone, [START ON 04/05/2023] morphine, [START ON 05/05/2023]  morphine, [START ON 06/04/2023] morphine, and benefiber.  Pharmacotherapy (Medications Ordered): Meds ordered this encounter  Medications   morphine (MS CONTIN) 15 MG 12 hr tablet    Sig: Take 1 tablet (15 mg total) by mouth every 8 (eight) hours. Must last 30 days. Do not break tablet    Dispense:  90 tablet    Refill:  0    DO NOT: delete (not duplicate); no partial-fill (will deny script to complete), no refill request (F/U required). DISPENSE: 1 day early if closed on fill date. WARN: No CNS-depressants within 8 hrs of med.   morphine (MS CONTIN) 15 MG 12 hr tablet    Sig: Take  1 tablet (15 mg total) by mouth every 8 (eight) hours. Must last 30 days. Do not break tablet    Dispense:  90 tablet    Refill:  0    DO NOT: delete (not duplicate); no partial-fill (will deny script to complete), no refill request (F/U required). DISPENSE: 1 day early if closed on fill date. WARN: No CNS-depressants within 8 hrs of med.   morphine (MS CONTIN) 15 MG 12 hr tablet    Sig: Take 1 tablet (15 mg total) by mouth every 8 (eight) hours. Must last 30 days. Do not break tablet    Dispense:  90 tablet    Refill:  0    DO NOT: delete (not duplicate); no partial-fill (will deny script to complete), no refill request (F/U required). DISPENSE: 1 day early if closed on fill date. WARN: No CNS-depressants within 8 hrs of med.   Orders:  Orders Placed This Encounter  Procedures   LUMBAR FACET(MEDIAL BRANCH NERVE BLOCK) MBNB    Diagnosis: Lumbar Facet Syndrome (M47.816); Lumbosacral Facet Syndrome (M47.817); Lumbar Facet Joint Pain (M54.59) Medical Necessity Statement: 1.Severe chronic axial low back pain causing functional impairment documented by ongoing pain scale assessments. 2.Pain present for longer than 3 months (Chronic) documented to have failed noninvasive conservative therapies. 3.Absence of untreated radiculopathy. 4.There is no radiological evidence of untreated fractures, tumor, infection, or  deformity.  Physical Examination Findings: Positive Kemp Maneuver: (Y)  Positive Lumbar Hyperextension-Rotation provocative test: (Y)    Standing Status:   Future    Expiration Date:   06/28/2023    Scheduling Instructions:     Procedure: Lumbar facet Block     Type: Medial Branch Block     Side: Bilateral     Purpose: Diagnostic Radiologic Mapping     Level(s): L3-4, L4-5, L5-S1, and TBD by Fluoroscopic Mapping Facets (L2, L3, L4, L5, S1, and TBD Medial Branch)     Sedation: With Sedation.     Timeframe: As soon as schedule allows.    Where will this procedure be performed?:   [facility]   Follow-up plan:   Return for (ECT): (B) L-FCT Blk #5.      Interventional Therapies  Risk Factors  Considerations:   Allergy: CONTRAST, IODINE, & shellfish  VASOVAGAL RXN BNZ Tx   MO (L-FCT 5" needle to the hub)      NO MORE RFA - intraoperative noncompliance, unnecessarily increasing radiation exposure NOTE: NO MORE RFA procedures. Difficulty following intra-procedural instructions and commands, resulting in unnecessarily long exposure of staff to radiation.    Planned  Pending:   Therapeutic bilateral lumbar facet MBB #5    Under consideration:   Diagnostic Left GONB    Completed:   Therapeutic bilateral lumbar facet (L2-S1) MBB x4 (04/14/2022) (100/100/80/80)  Therapeutic left lumbar facet (L2-S1 MB) RFA x2 (05/09/2019) (90/90/77/>75)  Therapeutic right lumbar facet (L2-S1 MB) RFA x6 (12/14/2019) (100/100/70/70) (No intra-op compliance)  Diagnostic/therapeutic right small joint inj. (thumb) x1 (03/15/2018) (N/A)  Diagnostic/therapeutic left cervical ESI x1 (12/18/2015) (90/70/50/50)    Completed by other providers:   None at this time   Therapeutic  Palliative (PRN) options:   Palliative lumbar facet MBB  Therapeutic cervical ESI    Pharmacotherapy  Nonopioids transferred 12/14/2019: Gabapentin, Amitiza, Benefiber, Mobic      Recent Visits Date Type  Provider Dept  03/31/23 Office Visit Delano Metz, MD [facility]  Showing recent visits within past 90 days and meeting all other requirements Future Appointments  Date Type Provider Dept  04/20/23 Appointment Delano Metz, MD [facility]  06/30/23 Appointment Delano Metz, MD [facility]  Showing future appointments within next 90 days and meeting all other requirements  I discussed the assessment and treatment plan with the patient. The patient was provided an opportunity to ask questions and all were answered. The patient agreed with the plan and demonstrated an understanding of the instructions.  Patient advised to call back or seek an in-person evaluation if the symptoms or condition worsens.  Duration of encounter: 34 minutes.  Total time on encounter, as per AMA guidelines included both the face-to-face and non-face-to-face time personally spent by the physician and/or other qualified health care professional(s) on the day of the encounter (includes time in activities that require the physician or other qualified health care professional and does not include time in activities normally performed by clinical staff). Physician's time may include the following activities when performed: Preparing to see the patient (e.g., pre-charting review of records, searching for previously ordered imaging, lab work, and nerve conduction tests) Review of prior analgesic pharmacotherapies. Reviewing PMP Interpreting ordered tests (e.g., lab work, imaging, nerve conduction tests) Performing post-procedure evaluations, including interpretation of diagnostic procedures Obtaining and/or reviewing separately obtained history Performing a medically appropriate examination and/or evaluation Counseling and educating the patient/family/caregiver Ordering medications, tests, or procedures Referring and communicating with other health care professionals (when not  separately reported) Documenting clinical information in the electronic or other health record Independently interpreting results (not separately reported) and communicating results to the patient/ family/caregiver Care coordination (not separately reported)  Note by: Oswaldo Done, MD Date: 03/31/2023; Time: 5:34 AM

## 2023-03-31 ENCOUNTER — Ambulatory Visit: Payer: 59 | Attending: Pain Medicine | Admitting: Pain Medicine

## 2023-03-31 ENCOUNTER — Encounter: Payer: Self-pay | Admitting: Pain Medicine

## 2023-03-31 VITALS — BP 136/74 | HR 77 | Temp 97.6°F | Resp 18 | Ht 71.0 in | Wt 249.0 lb

## 2023-03-31 DIAGNOSIS — M25562 Pain in left knee: Secondary | ICD-10-CM | POA: Diagnosis not present

## 2023-03-31 DIAGNOSIS — G894 Chronic pain syndrome: Secondary | ICD-10-CM | POA: Diagnosis not present

## 2023-03-31 DIAGNOSIS — M51362 Other intervertebral disc degeneration, lumbar region with discogenic back pain and lower extremity pain: Secondary | ICD-10-CM | POA: Diagnosis not present

## 2023-03-31 DIAGNOSIS — M25561 Pain in right knee: Secondary | ICD-10-CM | POA: Insufficient documentation

## 2023-03-31 DIAGNOSIS — E1165 Type 2 diabetes mellitus with hyperglycemia: Secondary | ICD-10-CM | POA: Diagnosis not present

## 2023-03-31 DIAGNOSIS — M79605 Pain in left leg: Secondary | ICD-10-CM | POA: Insufficient documentation

## 2023-03-31 DIAGNOSIS — E1140 Type 2 diabetes mellitus with diabetic neuropathy, unspecified: Secondary | ICD-10-CM | POA: Diagnosis not present

## 2023-03-31 DIAGNOSIS — M79604 Pain in right leg: Secondary | ICD-10-CM | POA: Diagnosis not present

## 2023-03-31 DIAGNOSIS — M47816 Spondylosis without myelopathy or radiculopathy, lumbar region: Secondary | ICD-10-CM | POA: Insufficient documentation

## 2023-03-31 DIAGNOSIS — E119 Type 2 diabetes mellitus without complications: Secondary | ICD-10-CM | POA: Diagnosis not present

## 2023-03-31 DIAGNOSIS — Z139 Encounter for screening, unspecified: Secondary | ICD-10-CM | POA: Diagnosis not present

## 2023-03-31 DIAGNOSIS — M542 Cervicalgia: Secondary | ICD-10-CM | POA: Insufficient documentation

## 2023-03-31 DIAGNOSIS — Z96641 Presence of right artificial hip joint: Secondary | ICD-10-CM | POA: Insufficient documentation

## 2023-03-31 DIAGNOSIS — M545 Low back pain, unspecified: Secondary | ICD-10-CM | POA: Insufficient documentation

## 2023-03-31 DIAGNOSIS — M47817 Spondylosis without myelopathy or radiculopathy, lumbosacral region: Secondary | ICD-10-CM | POA: Insufficient documentation

## 2023-03-31 DIAGNOSIS — Z794 Long term (current) use of insulin: Secondary | ICD-10-CM | POA: Diagnosis not present

## 2023-03-31 DIAGNOSIS — Z79899 Other long term (current) drug therapy: Secondary | ICD-10-CM | POA: Insufficient documentation

## 2023-03-31 DIAGNOSIS — Z79891 Long term (current) use of opiate analgesic: Secondary | ICD-10-CM | POA: Diagnosis not present

## 2023-03-31 DIAGNOSIS — M25551 Pain in right hip: Secondary | ICD-10-CM | POA: Diagnosis not present

## 2023-03-31 DIAGNOSIS — G8929 Other chronic pain: Secondary | ICD-10-CM | POA: Insufficient documentation

## 2023-03-31 DIAGNOSIS — M5030 Other cervical disc degeneration, unspecified cervical region: Secondary | ICD-10-CM | POA: Diagnosis not present

## 2023-03-31 DIAGNOSIS — E160 Drug-induced hypoglycemia without coma: Secondary | ICD-10-CM | POA: Diagnosis not present

## 2023-03-31 DIAGNOSIS — T383X5A Adverse effect of insulin and oral hypoglycemic [antidiabetic] drugs, initial encounter: Secondary | ICD-10-CM | POA: Diagnosis not present

## 2023-03-31 MED ORDER — MORPHINE SULFATE ER 15 MG PO TBCR
15.0000 mg | EXTENDED_RELEASE_TABLET | Freq: Three times a day (TID) | ORAL | 0 refills | Status: DC
Start: 2023-05-05 — End: 2023-06-29

## 2023-03-31 MED ORDER — MORPHINE SULFATE ER 15 MG PO TBCR: 15.0000 mg | EXTENDED_RELEASE_TABLET | Freq: Three times a day (TID) | ORAL | 0 refills | Status: DC

## 2023-03-31 NOTE — Progress Notes (Unsigned)
 Nursing Pain Medication Assessment:  Safety precautions to be maintained throughout the outpatient stay will include: orient to surroundings, keep bed in low position, maintain call bell within reach at all times, provide assistance with transfer out of bed and ambulation.  Medication Inspection Compliance: Pill count conducted under aseptic conditions, in front of the patient. Neither the pills nor the bottle was removed from the patient's sight at any time. Once count was completed pills were immediately returned to the patient in their original bottle.  Medication: Morphine IR Pill/Patch Count:  73 of 90 pills remain Pill/Patch Appearance: Markings consistent with prescribed medication Bottle Appearance: Standard pharmacy container. Clearly labeled. Filled Date: 02 / 19 / 2025 Last Medication intake:  Today

## 2023-04-02 ENCOUNTER — Encounter: Payer: Self-pay | Admitting: Family Medicine

## 2023-04-02 ENCOUNTER — Ambulatory Visit (INDEPENDENT_AMBULATORY_CARE_PROVIDER_SITE_OTHER): Payer: 59 | Admitting: Family Medicine

## 2023-04-02 VITALS — BP 134/64 | HR 86 | Temp 98.0°F | Resp 18 | Ht 71.0 in | Wt 254.4 lb

## 2023-04-02 DIAGNOSIS — H6123 Impacted cerumen, bilateral: Secondary | ICD-10-CM

## 2023-04-02 DIAGNOSIS — E785 Hyperlipidemia, unspecified: Secondary | ICD-10-CM | POA: Diagnosis not present

## 2023-04-02 DIAGNOSIS — E119 Type 2 diabetes mellitus without complications: Secondary | ICD-10-CM

## 2023-04-02 DIAGNOSIS — I152 Hypertension secondary to endocrine disorders: Secondary | ICD-10-CM | POA: Diagnosis not present

## 2023-04-02 DIAGNOSIS — Z7984 Long term (current) use of oral hypoglycemic drugs: Secondary | ICD-10-CM

## 2023-04-02 DIAGNOSIS — E1169 Type 2 diabetes mellitus with other specified complication: Secondary | ICD-10-CM

## 2023-04-02 DIAGNOSIS — Z1159 Encounter for screening for other viral diseases: Secondary | ICD-10-CM

## 2023-04-02 DIAGNOSIS — Z79899 Other long term (current) drug therapy: Secondary | ICD-10-CM | POA: Diagnosis not present

## 2023-04-02 DIAGNOSIS — E1159 Type 2 diabetes mellitus with other circulatory complications: Secondary | ICD-10-CM

## 2023-04-02 DIAGNOSIS — F39 Unspecified mood [affective] disorder: Secondary | ICD-10-CM

## 2023-04-02 DIAGNOSIS — G894 Chronic pain syndrome: Secondary | ICD-10-CM | POA: Diagnosis not present

## 2023-04-02 NOTE — Patient Instructions (Addendum)
 It was a pleasure meeting you today. Thank you for allowing me to take part in your health care.  Our goals for today as we discussed include:  Debrox 5 drops to each ear two times a day for 5 days If no improvement can schedule appointment in nurse clinic for ear flushing  We will get some labs today.  If they are abnormal or we need to do something about them, I will call you.  If they are normal, I will send you a message on MyChart (if it is active) or a letter in the mail.  If you don't hear from [place] in 2 weeks, please call the office at the number below.   Follow up with Dr Elna Breslow as recommended Plan to wean Alprazolam in future.  Benzodiazepine use is associated with an increased risk of falls, hip fractures, and mobility problems in older adults. In addition it may be associated with an increased risk of disability in mobility and activities of daily living in this population.  Other side effects include: Decreased libido, weight gain, weight loss,increased/decreased appetite,constipation,cognitive dysfunction,memory impairment, irritability  Sited from [employer] and Up To Date    This is a list of the screening recommended for you and due dates:  Health Maintenance  Topic Date Due   Hepatitis C Screening  Never done   COVID-19 Vaccine (8 - 2024-25 season) 01/31/2023   Hemoglobin A1C  05/10/2023   Yearly kidney health urinalysis for diabetes  06/08/2023   Yearly kidney function blood test for diabetes  09/22/2023   Eye exam for diabetics  01/11/2024   Complete foot exam   01/19/2024   Medicare Annual Wellness Visit  02/16/2024   DTaP/Tdap/Td vaccine (2 - Td or Tdap) 04/08/2025   Pneumonia Vaccine  Completed   Flu Shot  Completed   Zoster (Shingles) Vaccine  Completed   HPV Vaccine  Aged Out   Follow up as needed  If you have any questions or concerns, please do not hesitate to call the office at [phone].  I look forward to our next visit and until then take care and  stay safe.  Regards,   Dana Allan, MD   [facility]

## 2023-04-02 NOTE — Progress Notes (Signed)
 SUBJECTIVE:   Chief Complaint  Patient presents with   Establish Care    Transferring from Dr. Birdie Sons   HPI Presents to clinic to transfer care  Discussed the use of AI scribe software for clinical note transcription with the patient, who gave verbal consent to proceed.  History of Present Illness Alan Schmidt is a 78 year old male who presents for medication review and management. He was referred by Dr. Birdie Sons for continued care after his departure.  He is on several medications for chronic conditions, including carvedilol for blood pressure, metformin and Mounjaro for diabetes, and rosuvastatin for cholesterol. He recently switched from Jardiance to Lantus for diabetes management. He also takes olmesartan, Protonix, and tamsulosin. He experiences frequent urination at night and questions the effectiveness of hydrochlorothiazide. Constipation is a concern due to his medications, and he has recently used Senokot. Gabapentin is taken at night for neuropathy, which causes drowsiness.  He has a history of multiple hip surgeries, including a hip replacement and a revision, leading to prolonged rehabilitation. Mobility is challenging, especially when putting on shoes and socks, and he occasionally uses crutches. He experiences significant pain radiating to his left big toe, often when he forgets his pain medication. Tylenol is used occasionally, but he finds Advil more effective. 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  He has been on Xanax for about a year for anxiety, taking 0.5 mg twice daily. Wellbutrin is taken at 300 mg in the morning, though he is unsure of its benefits. Buspar is taken three times a day for anxiety, and Lexapro is used for anxiety and depression. He experiences anxiety and depression, managed with multiple medications.  He has a history of earwax buildup affecting his hearing and uses Debrox to manage it. He  recalls a past incident of sudden hearing loss due to wax buildup, resolved with professional cleaning.    PERTINENT PMH / PSH: As above  OBJECTIVE:  BP 134/64   Pulse 86   Temp 98 F (36.7 C)   Resp 18   Ht 5\' 11"  (1.803 m)   Wt 254 lb 6 oz (115.4 kg)   SpO2 95%   BMI 35.48 kg/m    Physical Exam Vitals reviewed.  Constitutional:      Appearance: He is obese.  HENT:     Head: Normocephalic.     Right Ear: Ear canal and external ear normal. There is impacted cerumen.     Left Ear: Ear canal and external ear normal. There is impacted cerumen.     Nose: Nose normal.     Mouth/Throat:     Mouth: Mucous membranes are moist.  Eyes:     Conjunctiva/sclera: Conjunctivae normal.     Pupils: Pupils are equal, round, and reactive to light.  Neck:     Thyroid: No thyromegaly or thyroid tenderness.     Vascular: No carotid bruit.  Cardiovascular:     Rate and Rhythm: Normal rate and regular rhythm.     Pulses: Normal pulses.     Heart sounds: Normal heart sounds.  Pulmonary:     Effort: Pulmonary effort is normal.     Breath sounds: Normal breath sounds.  Abdominal:     General: Abdomen is flat. Bowel sounds are normal.     Palpations: Abdomen is soft.  Musculoskeletal:        General: Normal range of motion.     Cervical back: Normal  range of motion and neck supple.     Right lower leg: No edema.     Left lower leg: No edema.  Lymphadenopathy:     Cervical: No cervical adenopathy.  Neurological:     Mental Status: He is alert.  Psychiatric:        Mood and Affect: Mood normal.        Behavior: Behavior normal.        Thought Content: Thought content normal.        Judgment: Judgment normal.           04/02/2023    1:13 PM 03/31/2023    3:15 PM 02/16/2023    2:36 PM 02/16/2023   11:10 AM 02/11/2023    3:17 PM  Depression screen PHQ 2/9  Decreased Interest 1 0 0 0   Down, Depressed, Hopeless 0 0 0 0   PHQ - 2 Score 1 0 0 0   Altered sleeping 1  0 0   Tired,  decreased energy 1  0 0   Change in appetite 1  0 0   Feeling bad or failure about yourself  0  0 0   Trouble concentrating 0  0 0   Moving slowly or fidgety/restless 0  0 0   Suicidal thoughts 0  0 0   PHQ-9 Score 4  0 0   Difficult doing work/chores Somewhat difficult  Not difficult at all Not difficult at all      Information is confidential and restricted. Go to Review Flowsheets to unlock data.      04/02/2023    1:13 PM 02/16/2023   11:10 AM 02/11/2023    3:33 PM 12/28/2022    8:30 AM  GAD 7 : Generalized Anxiety Score  Nervous, Anxious, on Edge 1 0  3  Control/stop worrying 1 0  1  Worry too much - different things 1 0  1  Trouble relaxing 1 0  2  Restless 1 0  1  Easily annoyed or irritable 0 0  1  Afraid - awful might happen 1 0  2  Total GAD 7 Score 6 0  11  Anxiety Difficulty Somewhat difficult Not difficult at all  Very difficult     Information is confidential and restricted. Go to Review Flowsheets to unlock data.    ASSESSMENT/PLAN:  Hypertension associated with diabetes [facility]) Assessment & Plan: On carvedilol 25mg  twice daily and olmesartan. -Continue current antihypertensive regimen.  Orders: -     Comprehensive metabolic panel  Controlled substance agreement signed -     ToxASSURE Select 13 (MW), Urine  Mood disorder (HCC) Assessment & Plan: Reports significant anxiety. Currently on Xanax 0.5mg  twice daily and Wellbutrin 300mg  daily, though Wellbutrin use is inconsistent and taken in the evening. Discussed risks of longterm use of benzodiazepine to include association with an increased risk of falls, hip fractures, mobility problems, cognitive dysfunction,memory impairment, and irritability.  Was recently seen by psychiatry who recommended weaning Xanax.  Discussed with patient need for controlled substance contract and UDS.  Patient requested time to read contract.   -No refill of Xanax requested at this time -Patient left clinic without signing contract  or leaving UDS.  Will need completed prior to refills. -Recommend he follow up with Psychiatry, patient declined    Orders: -     ToxASSURE Select 13 (MW), Urine  Need for hepatitis C screening test -     Hepatitis C antibody  Chronic pain syndrome Assessment &  Plan: History of multiple surgeries including hip replacement and revision. Reports difficulty with mobility and persistent pain. Currently using Advil and occasional Tylenol for pain management. -Continue current pain management regimen.   Type 2 diabetes mellitus treated with insulin (HCC) Assessment & Plan: Recently switched from Jardiance to [facility] 7.5mg  by endocrinologist. Also on metformin 1000mg  twice daily. -Continue current antidiabetic regimen as directed by endocrinologist.   Hyperlipidemia associated with type 2 diabetes mellitus (HCC) Assessment & Plan: On statin -Continue rosuvastatin.   Bilateral impacted cerumen Assessment & Plan: Reports difficulty hearing due to wax buildup. -Use Debrox 5 drops twice daily for 5 days to soften wax. -If no improvement can schedule nurse appointment for irrigation     PDMP reviewed  Return if symptoms worsen or fail to improve, for PCP.  Dana Allan, MD

## 2023-04-09 ENCOUNTER — Ambulatory Visit: Admitting: Internal Medicine

## 2023-04-09 ENCOUNTER — Other Ambulatory Visit: Payer: 59

## 2023-04-09 ENCOUNTER — Encounter: Payer: Self-pay | Admitting: Internal Medicine

## 2023-04-09 VITALS — BP 132/84 | HR 72 | Temp 98.6°F | Ht 73.0 in | Wt 242.0 lb

## 2023-04-09 DIAGNOSIS — R3914 Feeling of incomplete bladder emptying: Secondary | ICD-10-CM

## 2023-04-09 DIAGNOSIS — Z794 Long term (current) use of insulin: Secondary | ICD-10-CM

## 2023-04-09 DIAGNOSIS — F32A Depression, unspecified: Secondary | ICD-10-CM

## 2023-04-09 DIAGNOSIS — N401 Enlarged prostate with lower urinary tract symptoms: Secondary | ICD-10-CM

## 2023-04-09 DIAGNOSIS — R600 Localized edema: Secondary | ICD-10-CM

## 2023-04-09 DIAGNOSIS — I10 Essential (primary) hypertension: Secondary | ICD-10-CM | POA: Diagnosis not present

## 2023-04-09 DIAGNOSIS — E1142 Type 2 diabetes mellitus with diabetic polyneuropathy: Secondary | ICD-10-CM | POA: Diagnosis not present

## 2023-04-09 DIAGNOSIS — F419 Anxiety disorder, unspecified: Secondary | ICD-10-CM | POA: Diagnosis not present

## 2023-04-09 DIAGNOSIS — E119 Type 2 diabetes mellitus without complications: Secondary | ICD-10-CM

## 2023-04-09 DIAGNOSIS — K219 Gastro-esophageal reflux disease without esophagitis: Secondary | ICD-10-CM

## 2023-04-09 DIAGNOSIS — E782 Mixed hyperlipidemia: Secondary | ICD-10-CM

## 2023-04-09 LAB — POCT CBG (FASTING - GLUCOSE)-MANUAL ENTRY: Glucose Fasting, POC: 133 mg/dL — AB (ref 70–99)

## 2023-04-09 MED ORDER — ESCITALOPRAM OXALATE 20 MG PO TABS: 20.0000 mg | ORAL_TABLET | Freq: Every day | ORAL | 0 refills | Status: DC

## 2023-04-09 MED ORDER — PANTOPRAZOLE SODIUM 40 MG PO TBEC: 40.0000 mg | DELAYED_RELEASE_TABLET | Freq: Every day | ORAL | 0 refills | Status: DC

## 2023-04-09 MED ORDER — HYDROCHLOROTHIAZIDE 25 MG PO TABS: 25.0000 mg | ORAL_TABLET | Freq: Every day | ORAL | 0 refills | Status: DC

## 2023-04-09 MED ORDER — EMPAGLIFLOZIN 25 MG PO TABS
25.0000 mg | ORAL_TABLET | Freq: Every day | ORAL | 0 refills | Status: AC
Start: 2023-04-09 — End: 2023-07-08

## 2023-04-09 MED ORDER — OLMESARTAN MEDOXOMIL 40 MG PO TABS: 40.0000 mg | ORAL_TABLET | Freq: Every day | ORAL | 0 refills | Status: DC

## 2023-04-09 MED ORDER — TAMSULOSIN HCL 0.4 MG PO CAPS
0.8000 mg | ORAL_CAPSULE | Freq: Every day | ORAL | 1 refills | Status: DC
Start: 2023-04-09 — End: 2023-10-18

## 2023-04-09 MED ORDER — ALPRAZOLAM 1 MG PO TABS: ORAL_TABLET | ORAL | 1 refills | Status: DC

## 2023-04-09 MED ORDER — METFORMIN HCL 1000 MG PO TABS
1000.0000 mg | ORAL_TABLET | Freq: Two times a day (BID) | ORAL | 0 refills | Status: AC
Start: 2023-04-09 — End: 2023-07-08

## 2023-04-09 MED ORDER — MOUNJARO 7.5 MG/0.5ML ~~LOC~~ SOAJ: 7.5000 mg | SUBCUTANEOUS | 0 refills | Status: AC

## 2023-04-09 NOTE — Progress Notes (Signed)
 New Patient Office Visit  Subjective:  Patient ID: Alan Schmidt, male    DOB: [birth year]  Age: 78 y.o. MRN: [id number]  Chief Complaint  Patient presents with   Establish Care    NPE    Here to transfer IM care after his PCP relocated to [facility]. PMH of DM2 with last A1c of 8.8, HTN and lumbar spondylosis. He denies any complaints today and requests refills. Denies any complaints today    No other concerns at this time.   Past Medical History:  Diagnosis Date   Acute postoperative pain 11/24/2016   Anxiety    Chronic hip pain (Right) 12/05/2014   Chronic lumbar pain    Depression    Diabetes mellitus without complication (HCC)    Hyperlipidemia    Hypertension    Kidney stones    Migraines     Past Surgical History:  Procedure Laterality Date   right hip surgery     4 surgeries   TONSILLECTOMY     TOTAL HIP ARTHROPLASTY      Social History   Socioeconomic History   Marital status: Divorced    Spouse name: 1 Bio; 6 Adopted   Number of children: 7   Years of education: doctorate   Highest education level: Doctorate  Occupational History   Occupation: Retired  Tobacco Use   Smoking status: Former    Types: Cigarettes   Smokeless tobacco: Never  Vaping Use   Vaping status: Never Used  Substance and Sexual Activity   Alcohol use: No    Alcohol/week: 0.0 standard drinks of alcohol   Drug use: No   Sexual activity: Not Currently  Other Topics Concern   Not on file  Social History Narrative   Per patient he has 1 biological child and 6 adopted children   Social Drivers of Corporate investment banker Strain: High Risk (03/31/2023)   Received from [facility]   Overall Financial Resource Strain (CARDIA)    Difficulty of Paying Living Expenses: Very hard  Food Insecurity: Food Insecurity Present (03/31/2023)   Received from [facility]   Hunger Vital Sign    Worried About Running Out of Food in the Last Year:  Sometimes true    Ran Out of Food in the Last Year: Sometimes true  Transportation Needs: Unmet Transportation Needs (03/31/2023)   Received from [facility]   PRAPARE - Transportation    In the past 12 months, has lack of transportation kept you from medical appointments or from getting medications?: Yes    Lack of Transportation (Non-Medical): Yes  Physical Activity: Inactive (02/16/2023)   Exercise Vital Sign    Days of Exercise per Week: 0 days    Minutes of Exercise per Session: 0 min  Stress: No Stress Concern Present (02/16/2023)   [employer] of Occupational Health - Occupational Stress Questionnaire    Feeling of Stress : Only a little  Social Connections: Socially Isolated (02/16/2023)   Social Connection and Isolation Panel [NHANES]    Frequency of Communication with Friends and Family: More than three times a week    Frequency of Social Gatherings with Friends and Family: Never    Attends Religious Services: Never    Database administrator or Organizations: No    Attends Banker Meetings: Never    Marital Status: Divorced  Catering manager Violence: Not At Risk (02/16/2023)   Humiliation, Afraid, Rape, and Kick questionnaire  Fear of Current or Ex-Partner: No    Emotionally Abused: No    Physically Abused: No    Sexually Abused: No    Family History  Problem Relation Age of Onset   Cancer Mother    Heart disease Father    Stroke Father    Diabetes Father    Diabetes Sister    Diabetes Sister    Mental illness Neg Hx     Allergies  Allergen Reactions   Contrast Media [Iodinated Contrast Media] Swelling   Iodine Swelling   Shellfish Allergy Nausea And Vomiting and Swelling    Outpatient Medications Prior to Visit  Medication Sig   acetaminophen (TYLENOL) 500 MG tablet Take 500 mg by mouth every 6 (six) hours as needed.   aspirin EC 81 MG tablet Take 81 mg by mouth daily.   buPROPion (WELLBUTRIN XL) 150 MG 24 hr tablet  TAKE 1 TABLET (150 MG TOTAL) BY MOUTH DAILY FOR 7 DAYS, THEN 2 TABLETS (300 MG TOTAL) DAILY.   carvedilol (COREG) 25 MG tablet Take 1 tablet by mouth twice daily.   Continuous Blood Gluc Receiver (FREESTYLE LIBRE 14 DAY READER) DEVI 1 Device by Does not apply route daily. Use to scan to check blood sugar up to 7 times daily; E11.42,   Continuous Glucose Sensor (FREESTYLE LIBRE 14 DAY SENSOR) MISC 1 Device by Does not apply route every 14 (fourteen) days. E11.42   desoximetasone (TOPICORT) 0.25 % cream APPLY CREAM TO AFFECTED AREA TWO TIMES DAILY, FOR UP TO 7 DAYS, DO NOT APPLY TO FACE   Strength: 0.25 %   EPINEPHrine (EPIPEN 2-PAK) 0.3 mg/0.3 mL IJ SOAJ injection Inject 0.3 mg into the muscle as needed for anaphylaxis.   gabapentin (NEURONTIN) 250 MG/5ML solution Take 6 ml by mouth at bedtime.   insulin lispro (HUMALOG KWIKPEN) 100 UNIT/ML KwikPen INJECT 5 TO 7 UNITS UNDER THE SKIN THREE TIMES DAILY WITH MEALS   Insulin Pen Needle (BD PEN NEEDLE NANO U/F) 32G X 4 MM MISC USE EVERY DAY   LANTUS SOLOSTAR 100 UNIT/ML Solostar Pen Inject 8 Units into the skin daily.   loratadine (CLARITIN) 10 MG tablet Take 1 tablet by mouth daily.   methocarbamol (ROBAXIN) 500 MG tablet Take 500 mg by mouth 2 (two) times daily.   mometasone (NASONEX) 50 MCG/ACT nasal spray Instill 2 sprays into each nostril once daily.   morphine (MS CONTIN) 15 MG 12 hr tablet Take 1 tablet (15 mg total) by mouth every 8 (eight) hours. Must last 30 days. Do not break tablet   [START ON 05/05/2023] morphine (MS CONTIN) 15 MG 12 hr tablet Take 1 tablet (15 mg total) by mouth every 8 (eight) hours. Must last 30 days. Do not break tablet   rosuvastatin (CRESTOR) 40 MG tablet Take 1 tablet by mouth daily.   sennosides-docusate sodium (SENOKOT-Kimmie Doren) 8.6-50 MG tablet Take 1 tablet by mouth daily as needed.    [DISCONTINUED] empagliflozin (JARDIANCE) 25 MG TABS tablet Take 1 tablet by mouth daily.   [DISCONTINUED] escitalopram (LEXAPRO) 20 MG  tablet Take 1 tablet by mouth daily.   [DISCONTINUED] hydrochlorothiazide (HYDRODIURIL) 25 MG tablet Take 1 tablet by mouth daily.   [DISCONTINUED] metFORMIN (GLUCOPHAGE) 1000 MG tablet Take 1 tablet by mouth twice daily with a meal.   [DISCONTINUED] olmesartan (BENICAR) 40 MG tablet Take 1 tablet by mouth daily.   [DISCONTINUED] pantoprazole (PROTONIX) 40 MG tablet Take 1 tablet by mouth daily.   [DISCONTINUED] tamsulosin (FLOMAX) 0.4 MG CAPS capsule  Take 1 capsule by mouth twice daily.   [DISCONTINUED] tirzepatide (MOUNJARO) 7.5 MG/0.5ML Pen INJECT 7.5 MG SUBCUTANEOUSLY WEEKLY   ACCU-CHEK GUIDE test strip USE AS INSTRUCTED DX CODE: E11.9 (Patient not taking: Reported on 04/09/2023)   busPIRone (BUSPAR) 10 MG tablet Take 1 tablet (10 mg total) by mouth 3 (three) times daily.   lubiprostone (AMITIZA) 8 MCG capsule TAKE 1 CAPSULE (8 MCG TOTAL) BY MOUTH 2 (TWO) TIMES DAILY WITH A MEAL. SWALLOW THE MEDICATION WHOLE. DO NOT BREAK OR CHEW THE MEDICATION.   [START ON 06/04/2023] morphine (MS CONTIN) 15 MG 12 hr tablet Take 1 tablet (15 mg total) by mouth every 8 (eight) hours. Must last 30 days. Do not break tablet   naloxone (NARCAN) nasal spray 4 mg/0.1 mL Place 1 spray into the nose as needed for up to 365 doses (for opioid-induced respiratory depresssion). In case of emergency (overdose), spray once into each nostril. If no response within 3 minutes, repeat application and call 911.   Wheat Dextrin (BENEFIBER) POWD Take 6 g by mouth 3 (three) times daily before meals. (2 tsp = 6 g)   No facility-administered medications prior to visit.    Review of Systems  Constitutional:  Positive for weight loss (on GLP-1). Negative for malaise/fatigue.  HENT:  Positive for congestion.   Eyes:  Negative for blurred vision.       Floaters in left eye  Respiratory: Negative.    Cardiovascular: Negative.   Gastrointestinal:  Positive for constipation and heartburn.  Genitourinary:  Positive for frequency (+  nocturia).  Musculoskeletal:  Positive for back pain.  Neurological:  Positive for tingling.  Endo/Heme/Allergies:  Positive for environmental allergies.  Psychiatric/Behavioral:  Positive for depression. The patient is nervous/anxious.        Objective:   BP 132/84   Pulse 72   Temp 98.6 F (37 C)   Ht 6\' 1"  (1.854 m)   Wt 242 lb (109.8 kg)   SpO2 98%   BMI 31.93 kg/m   Vitals:   04/09/23 0958  BP: 132/84  Pulse: 72  Temp: 98.6 F (37 C)  Height: 6\' 1"  (1.854 m)  Weight: 242 lb (109.8 kg)  SpO2: 98%  BMI (Calculated): 31.93    Physical Exam   Results for orders placed or performed in visit on 04/09/23  POCT CBG (Fasting - Glucose)  Result Value Ref Range   Glucose Fasting, POC 133 (A) 70 - 99 mg/dL    Recent Results (from the past 2160 hours)  HM DIABETES EYE EXAM     Status: Abnormal   Collection Time: 01/11/23 11:57 AM  Result Value Ref Range   HM Diabetic Eye Exam Retinopathy (A) No Retinopathy    Comment: Abstracted by HIM  POCT CBG (Fasting - Glucose)     Status: Abnormal   Collection Time: 04/09/23 10:04 AM  Result Value Ref Range   Glucose Fasting, POC 133 (A) 70 - 99 mg/dL      Assessment & Plan:  As per problem list. Stricter low calorie diet, low cholesterol and low fat diet and exercise as much as possible.   Problem List Items Addressed This Visit       Digestive   GERD (gastroesophageal reflux disease)   Relevant Medications   pantoprazole (PROTONIX) 40 MG tablet     Endocrine   Diabetic peripheral neuropathy (HCC) (Chronic)   Relevant Medications   ALPRAZolam (XANAX) 1 MG tablet   tirzepatide (MOUNJARO) 7.5 MG/0.5ML Pen   olmesartan (  BENICAR) 40 MG tablet   metFORMIN (GLUCOPHAGE) 1000 MG tablet   escitalopram (LEXAPRO) 20 MG tablet   empagliflozin (JARDIANCE) 25 MG TABS tablet   Diabetes (HCC) (Chronic)   Relevant Medications   tirzepatide (MOUNJARO) 7.5 MG/0.5ML Pen   olmesartan (BENICAR) 40 MG tablet   metFORMIN  (GLUCOPHAGE) 1000 MG tablet   empagliflozin (JARDIANCE) 25 MG TABS tablet   Type 2 diabetes mellitus treated with insulin (HCC) - Primary   Relevant Medications   tirzepatide (MOUNJARO) 7.5 MG/0.5ML Pen   olmesartan (BENICAR) 40 MG tablet   metFORMIN (GLUCOPHAGE) 1000 MG tablet   empagliflozin (JARDIANCE) 25 MG TABS tablet   Other Relevant Orders   POCT CBG (Fasting - Glucose) (Completed)     Genitourinary   BPH (benign prostatic hyperplasia)   Relevant Medications   tamsulosin (FLOMAX) 0.4 MG CAPS capsule     Other   Hyperlipidemia   Relevant Medications   olmesartan (BENICAR) 40 MG tablet   hydrochlorothiazide (HYDRODIURIL) 25 MG tablet   Anxiety and depression   Relevant Medications   ALPRAZolam (XANAX) 1 MG tablet   escitalopram (LEXAPRO) 20 MG tablet   Other Visit Diagnoses       Essential hypertension       Relevant Medications   olmesartan (BENICAR) 40 MG tablet   hydrochlorothiazide (HYDRODIURIL) 25 MG tablet       Return in about 3 months (around 07/10/2023) for fu with labs prior.   Total time spent: 45 minutes  Luna Fuse, MD  04/09/2023   This document may have been prepared by [facility] Voice Recognition software and as such may include unintentional dictation errors.

## 2023-04-09 NOTE — Addendum Note (Signed)
 Addended by: Aundria Mems AHMAD on: 04/09/2023 03:34 PM   Modules accepted: Orders

## 2023-04-11 ENCOUNTER — Encounter: Payer: Self-pay | Admitting: Family Medicine

## 2023-04-11 DIAGNOSIS — Z1159 Encounter for screening for other viral diseases: Secondary | ICD-10-CM | POA: Insufficient documentation

## 2023-04-11 DIAGNOSIS — Z79899 Other long term (current) drug therapy: Secondary | ICD-10-CM | POA: Insufficient documentation

## 2023-04-11 NOTE — Assessment & Plan Note (Signed)
 On carvedilol 25mg  twice daily and olmesartan. -Continue current antihypertensive regimen.

## 2023-04-11 NOTE — Assessment & Plan Note (Signed)
 Recently switched from Jardiance to [facility] 7.5mg  by endocrinologist. Also on metformin 1000mg  twice daily. -Continue current antidiabetic regimen as directed by endocrinologist.

## 2023-04-11 NOTE — Assessment & Plan Note (Signed)
 On statin -Continue rosuvastatin.

## 2023-04-11 NOTE — Assessment & Plan Note (Signed)
 Reports difficulty hearing due to wax buildup. -Use Debrox 5 drops twice daily for 5 days to soften wax. -If no improvement can schedule nurse appointment for irrigation

## 2023-04-11 NOTE — Assessment & Plan Note (Signed)
 History of multiple surgeries including hip replacement and revision. Reports difficulty with mobility and persistent pain. Currently using Advil and occasional Tylenol for pain management. -Continue current pain management regimen.

## 2023-04-11 NOTE — Assessment & Plan Note (Addendum)
 Reports significant anxiety. Currently on Xanax 0.5mg  twice daily and Wellbutrin 300mg  daily, though Wellbutrin use is inconsistent and taken in the evening. Discussed risks of longterm use of benzodiazepine to include association with an increased risk of falls, hip fractures, mobility problems, cognitive dysfunction,memory impairment, and irritability.  Was recently seen by psychiatry who recommended weaning Xanax.  Discussed with patient need for controlled substance contract and UDS.  Patient requested time to read contract.   -No refill of Xanax requested at this time -Patient left clinic without signing contract or leaving UDS.  Will need completed prior to refills. -Recommend he follow up with Psychiatry, patient declined

## 2023-04-13 ENCOUNTER — Ambulatory Visit (INDEPENDENT_AMBULATORY_CARE_PROVIDER_SITE_OTHER): Admitting: Podiatry

## 2023-04-13 DIAGNOSIS — R600 Localized edema: Secondary | ICD-10-CM

## 2023-04-13 NOTE — Progress Notes (Signed)
 Diabetic shoes were dispensed they are functioning well.  No acute complaints.  Paperwork signed.

## 2023-04-18 ENCOUNTER — Other Ambulatory Visit: Payer: Self-pay | Admitting: Psychiatry

## 2023-04-18 DIAGNOSIS — F411 Generalized anxiety disorder: Secondary | ICD-10-CM

## 2023-04-19 DIAGNOSIS — M5459 Other low back pain: Secondary | ICD-10-CM | POA: Insufficient documentation

## 2023-04-19 DIAGNOSIS — M47816 Spondylosis without myelopathy or radiculopathy, lumbar region: Secondary | ICD-10-CM | POA: Insufficient documentation

## 2023-04-19 NOTE — Progress Notes (Unsigned)
 PROVIDER NOTE: Interpretation of information contained herein should be left to medically-trained personnel. Specific patient instructions are provided elsewhere under "Patient Instructions" section of medical record. This document was created in part using STT-dictation technology, any transcriptional errors that may result from this process are unintentional.  Patient: Alan Schmidt Type: Established DOB: Jun 15, 1945 MRN: [id number] PCP: Sherron Monday, MD  Service: Procedure DOS: 04/20/2023 Setting: Ambulatory Location: Ambulatory outpatient facility Delivery: Face-to-face Provider: Oswaldo Done, MD Specialty: Interventional Pain Management Specialty designation: 09 Location: Outpatient facility Ref. Prov.: Glori Luis, MD       Interventional Therapy   Type: Lumbar Facet, Medial Branch Block(s)   #5  Laterality: Bilateral  Level: L2, L3, L4, L5, and S1 Medial Branch Level(s). Injecting these levels blocks the L3-4, L4-5, and L5-S1 lumbar facet joints. Imaging: Fluoroscopic guidance Spinal ([id number]) Anesthesia: Local anesthesia (1-2% Lidocaine) Anxiolysis: IV Versed         Sedation: Moderate Sedation                       DOS: 04/20/2023 Performed by: Oswaldo Done, MD  Primary Purpose: Diagnostic/Therapeutic Indications: Low back pain severe enough to impact quality of life or function. 1. Chronic low back pain (1ry area of Pain) (Bilateral) (R>L) w/o sciatica   2. Degeneration of intervertebral disc of lumbar region with discogenic back pain   3. Lumbar facet joint osteoarthritis (Bilateral)   4. Lumbar facet syndrome (Bilateral) (R>L)   5. Spondylosis without myelopathy or radiculopathy, lumbosacral region   6. Lumbar facet joint pain   7. Osteoarthritis of lumbar spine   8. Lumbar facet arthropathy (Multilevel) (Bilateral)   9. Abnormal MRI, lumbar spine (06/02/2022)    NAS-11 Pain score:   Pre-procedure: 5 /10   Post-procedure: 0-No pain/10      Position / Prep / Materials:  Position: Prone  Prep solution: ChloraPrep (2% chlorhexidine gluconate and 70% isopropyl alcohol) Area Prepped: Posterolateral Lumbosacral Spine (Wide prep: From the lower border of the scapula down to the end of the tailbone and from flank to flank.)  Materials:  Tray: Block Needle(s):  Type: Spinal  Gauge (G): 22  Length: 5-in Qty: 4      H&P (Pre-op Assessment):  Alan Schmidt is a 78 y.o. (year old), male patient, seen today for interventional treatment. He  has a past surgical history that includes Total hip arthroplasty; Tonsillectomy; and right hip surgery. Alan Schmidt has a current medication list which includes the following prescription(s): accu-chek guide, acetaminophen, alprazolam, aspirin ec, bupropion, buspirone, carvedilol, freestyle libre 14 day reader, freestyle libre 14 day sensor, desoximetasone, empagliflozin, epinephrine, escitalopram, gabapentin, hydrochlorothiazide, insulin lispro, bd pen needle nano u/f, lantus solostar, loratadine, metformin, methocarbamol, mometasone, morphine, [START ON 05/05/2023] morphine, [START ON 06/04/2023] morphine, naloxone, olmesartan, pantoprazole, rosuvastatin, sennosides-docusate sodium, tamsulosin, mounjaro, lubiprostone, and benefiber, and the following Facility-Administered Medications: fentanyl and glycopyrrolate. His primarily concern today is the Back Pain and Hip Pain (right)  Initial Vital Signs:  Pulse/HCG Rate: 86ECG Heart Rate: 79 (nsr) Temp: 97.8 F (36.6 C) Resp: 18 BP: (!) 156/91 (Did not take BP med this am) SpO2: 98 %  BMI: Estimated body mass index is 32.82 kg/m as calculated from the following:   Height as of this encounter: 6' (1.829 m).   Weight as of this encounter: 242 lb (109.8 kg).  Risk Assessment: Allergies: Reviewed. He is allergic to contrast media [iodinated contrast media], iodine, and shellfish allergy.  Allergy Precautions: None required  Coagulopathies: Reviewed.  None identified.  Blood-thinner therapy: None at this time Active Infection(s): Reviewed. None identified. Alan Schmidt is afebrile  Site Confirmation: Alan Schmidt was asked to confirm the procedure and laterality before marking the site Procedure checklist: Completed Consent: Before the procedure and under the influence of no sedative(s), amnesic(s), or anxiolytics, the patient was informed of the treatment options, risks and possible complications. To fulfill our ethical and legal obligations, as recommended by the American Medical Association's Code of Ethics, I have informed the patient of my clinical impression; the nature and purpose of the treatment or procedure; the risks, benefits, and possible complications of the intervention; the alternatives, including doing nothing; the risk(s) and benefit(s) of the alternative treatment(s) or procedure(s); and the risk(s) and benefit(s) of doing nothing. The patient was provided information about the general risks and possible complications associated with the procedure. These may include, but are not limited to: failure to achieve desired goals, infection, bleeding, organ or nerve damage, allergic reactions, paralysis, and death. In addition, the patient was informed of those risks and complications associated to Spine-related procedures, such as failure to decrease pain; infection (i.e.: Meningitis, epidural or intraspinal abscess); bleeding (i.e.: epidural hematoma, subarachnoid hemorrhage, or any other type of intraspinal or peri-dural bleeding); organ or nerve damage (i.e.: Any type of peripheral nerve, nerve root, or spinal cord injury) with subsequent damage to sensory, motor, and/or autonomic systems, resulting in permanent pain, numbness, and/or weakness of one or several areas of the body; allergic reactions; (i.e.: anaphylactic reaction); and/or death. Furthermore, the patient was informed of those risks and complications associated with the  medications. These include, but are not limited to: allergic reactions (i.e.: anaphylactic or anaphylactoid reaction(s)); adrenal axis suppression; blood sugar elevation that in diabetics may result in ketoacidosis or comma; water retention that in patients with history of congestive heart failure may result in shortness of breath, pulmonary edema, and decompensation with resultant heart failure; weight gain; swelling or edema; medication-induced neural toxicity; particulate matter embolism and blood vessel occlusion with resultant organ, and/or nervous system infarction; and/or aseptic necrosis of one or more joints. Finally, the patient was informed that Medicine is not an exact science; therefore, there is also the possibility of unforeseen or unpredictable risks and/or possible complications that may result in a catastrophic outcome. The patient indicated having understood very clearly. We have given the patient no guarantees and we have made no promises. Enough time was given to the patient to ask questions, all of which were answered to the patient's satisfaction. Mr. Brinkmeyer has indicated that he wanted to continue with the procedure. Attestation: I, the ordering provider, attest that I have discussed with the patient the benefits, risks, side-effects, alternatives, likelihood of achieving goals, and potential problems during recovery for the procedure that I have provided informed consent. Date  Time: 04/20/2023  8:13 AM  Pre-Procedure Preparation:  Monitoring: As per clinic protocol. Respiration, ETCO2, SpO2, BP, heart rate and rhythm monitor placed and checked for adequate function Safety Precautions: Patient was assessed for positional comfort and pressure points before starting the procedure. Time-out: I initiated and conducted the "Time-out" before starting the procedure, as per protocol. The patient was asked to participate by confirming the accuracy of the "Time Out" information. Verification  of the correct person, site, and procedure were performed and confirmed by me, the nursing staff, and the patient. "Time-out" conducted as per Joint Commission's Universal Protocol (UP.01.01.01). Time: 0934 Start Time: 0934 hrs.  Description of Procedure:  Laterality: (see above) Targeted Levels: (see above)  Safety Precautions: Aspiration looking for blood return was conducted prior to all injections. At no point did we inject any substances, as a needle was being advanced. Before injecting, the patient was told to immediately notify me if he was experiencing any new onset of "ringing in the ears, or metallic taste in the mouth". No attempts were made at seeking any paresthesias. Safe injection practices and needle disposal techniques used. Medications properly checked for expiration dates. SDV (single dose vial) medications used. After the completion of the procedure, all disposable equipment used was discarded in the proper designated medical waste containers. Local Anesthesia: Protocol guidelines were followed. The patient was positioned over the fluoroscopy table. The area was prepped in the usual manner. The time-out was completed. The target area was identified using fluoroscopy. A 12-in long, straight, sterile hemostat was used with fluoroscopic guidance to locate the targets for each level blocked. Once located, the skin was marked with an approved surgical skin marker. Once all sites were marked, the skin (epidermis, dermis, and hypodermis), as well as deeper tissues (fat, connective tissue and muscle) were infiltrated with a small amount of a short-acting local anesthetic, loaded on a 10cc syringe with a 25G, 1.5-in  Needle. An appropriate amount of time was allowed for local anesthetics to take effect before proceeding to the next step. Local Anesthetic: Lidocaine 2.0% The unused portion of the local anesthetic was discarded in the proper designated containers. Technical description of  process:  L2 Medial Branch Nerve Block (MBB): The target area for the L2 medial branch is at the junction of the postero-lateral aspect of the superior articular process and the superior, posterior, and medial edge of the transverse process of L3. Under fluoroscopic guidance, a Quincke needle was inserted until contact was made with os over the superior postero-lateral aspect of the pedicular shadow (target area). After negative aspiration for blood, 0.5 mL of the nerve block solution was injected without difficulty or complication. The needle was removed intact. L3 Medial Branch Nerve Block (MBB): The target area for the L3 medial branch is at the junction of the postero-lateral aspect of the superior articular process and the superior, posterior, and medial edge of the transverse process of L4. Under fluoroscopic guidance, a Quincke needle was inserted until contact was made with os over the superior postero-lateral aspect of the pedicular shadow (target area). After negative aspiration for blood, 0.5 mL of the nerve block solution was injected without difficulty or complication. The needle was removed intact. L4 Medial Branch Nerve Block (MBB): The target area for the L4 medial branch is at the junction of the postero-lateral aspect of the superior articular process and the superior, posterior, and medial edge of the transverse process of L5. Under fluoroscopic guidance, a Quincke needle was inserted until contact was made with os over the superior postero-lateral aspect of the pedicular shadow (target area). After negative aspiration for blood, 0.5 mL of the nerve block solution was injected without difficulty or complication. The needle was removed intact. L5 Medial Branch Nerve Block (MBB): The target area for the L5 medial branch is at the junction of the postero-lateral aspect of the superior articular process and the superior, posterior, and medial edge of the sacral ala. Under fluoroscopic guidance, a  Quincke needle was inserted until contact was made with os over the superior postero-lateral aspect of the pedicular shadow (target area). After negative aspiration for blood, 0.5 mL of the nerve block solution  was injected without difficulty or complication. The needle was removed intact. S1 Medial Branch Nerve Block (MBB): The target area for the S1 medial branch is at the posterior and inferior 6 o'clock position of the L5-S1 facet joint. Under fluoroscopic guidance, the Quincke needle inserted for the L5 MBB was redirected until contact was made with os over the inferior and postero aspect of the sacrum, at the 6 o' clock position under the L5-S1 facet joint (Target area). After negative aspiration for blood, 0.5 mL of the nerve block solution was injected without difficulty or complication. The needle was removed intact.  Once the entire procedure was completed, the treated area was cleaned, making sure to leave some of the prepping solution back to take advantage of its long term bactericidal properties.         Illustration of the posterior view of the lumbar spine and the posterior neural structures. Laminae of L2 through S1 are labeled. DPRL5, dorsal primary ramus of L5; DPRS1, dorsal primary ramus of S1; DPR3, dorsal primary ramus of L3; FJ, facet (zygapophyseal) joint L3-L4; I, inferior articular process of L4; LB1, lateral branch of dorsal primary ramus of L1; IAB, inferior articular branches from L3 medial branch (supplies L4-L5 facet joint); IBP, intermediate branch plexus; MB3, medial branch of dorsal primary ramus of L3; NR3, third lumbar nerve root; S, superior articular process of L5; SAB, superior articular branches from L4 (supplies L4-5 facet joint also); TP3, transverse process of L3.   Facet Joint Innervation (* possible contribution)  L1-2 T12, L1 (L2*)  Medial Branch  L2-3 L1, L2 (L3*)         "          "  L3-4 L2, L3 (L4*)         "          "  L4-5 L3, L4 (L5*)         "           "  L5-S1 L4, L5, S1          "          "    Vitals:   04/20/23 0949 04/20/23 0954 04/20/23 1004 04/20/23 1013  BP: (!) 132/91 116/87 121/76 122/76  Pulse:      Resp: 18 10 10 14   Temp:  97.6 F (36.4 C)  (!) 97.4 F (36.3 C)  SpO2: 96% 95% 95% 97%  Weight:      Height:       End Time: 0948 hrs.  Imaging Guidance (Spinal):          Type of Imaging Technique: Fluoroscopy Guidance (Spinal) Indication(s): Fluoroscopy guidance for needle placement to enhance accuracy in procedures requiring precise needle localization for targeted delivery of medication in or near specific anatomical locations not easily accessible without such real-time imaging assistance. Exposure Time: Please see nurses notes. Contrast: None used. Fluoroscopic Guidance: I was personally present during the use of fluoroscopy. "Tunnel Vision Technique" used to obtain the best possible view of the target area. Parallax error corrected before commencing the procedure. "Direction-depth-direction" technique used to introduce the needle under continuous pulsed fluoroscopy. Once target was reached, antero-posterior, oblique, and lateral fluoroscopic projection used confirm needle placement in all planes. Images permanently stored in EMR. Interpretation: No contrast injected. I personally interpreted the imaging intraoperatively. Adequate needle placement confirmed in multiple planes. Permanent images saved into the patient's record.  Post-operative Assessment:  Post-procedure Vital Signs:  Pulse/HCG Rate: 1610  Temp: (!) 97.4 F (36.3 C) Resp: 14 BP: 122/76 SpO2: 97 %  EBL: None  Complications: No immediate post-treatment complications observed by team, or reported by patient.  Note: The patient tolerated the entire procedure well. A repeat set of vitals were taken after the procedure and the patient was kept under observation following institutional policy, for this type of procedure. Post-procedural neurological  assessment was performed, showing return to baseline, prior to discharge. The patient was provided with post-procedure discharge instructions, including a section on how to identify potential problems. Should any problems arise concerning this procedure, the patient was given instructions to immediately contact us, at any time, without hesitation. In any case, we plan to contact the patient by telephone for a follow-up status report regarding this interventional procedure.  Comments:  No additional relevant information.  Plan of Care (POC)  Orders:  Orders Placed This Encounter  Procedures   LUMBAR FACET(MEDIAL BRANCH NERVE BLOCK) MBNB    Scheduling Instructions:     Procedure: Lumbar facet block (AKA.: Lumbosacral medial branch nerve block)     Side: Bilateral     Level: L3-4, L4-5, L5-S1, and TBD Facets (L2, L3, L4, L5, S1, and TBD Medial Branch Nerves)     Sedation: Patient's choice.     Timeframe: Today    Where will this procedure be performed?:   [facility]   [facility] C-ARM 1-60 MIN NO REPORT    Intraoperative interpretation by procedural physician at [facility].    Standing Status:   Standing    Number of Occurrences:   1    Reason for exam::   Assistance in needle guidance and placement for procedures requiring needle placement in or near specific anatomical locations not easily accessible without such assistance.   Informed Consent Details: Physician/Practitioner Attestation; Transcribe to consent form and obtain patient signature    Nursing Order: Transcribe to consent form and obtain patient signature. Note: Always confirm laterality of pain with Mr. Janee Morn, before procedure.    Physician/Practitioner attestation of informed consent for procedure/surgical case:   I, the physician/practitioner, attest that I have discussed with the patient the benefits, risks, side effects, alternatives, likelihood of achieving goals and potential problems during  recovery for the procedure that I have provided informed consent.    Procedure:   Lumbar Facet Block  under fluoroscopic guidance    Physician/Practitioner performing the procedure:   Cecilia Nishikawa A. Laban Emperor MD    Indication/Reason:   Low Back Pain, with our without leg pain, due to Facet Joint Arthralgia (Joint Pain) Spondylosis (Arthritis of the Spine), without myelopathy or radiculopathy (Nerve Damage).   Provide equipment / supplies at bedside    Procedure tray: "Block Tray" (Disposable  single use) Skin infiltration needle: Regular 1.5-in, 25-G, (x1) Block Needle type: Spinal Amount/quantity: 4 Size: Medium (5-inch) Gauge: 22G    Standing Status:   Standing    Number of Occurrences:   1    Specify:   Block Tray   Saline lock IV    Have LR [phone] mL available and administer at 125 mL/hr if patient becomes hypotensive.    Standing Status:   Standing    Number of Occurrences:   1   Miscellanous precautions    Standing Status:   Standing    Number of Occurrences:   1   Miscellanous precautions    NOTE: Although It is true that patients can have allergies to shellfish and that shellfish contain iodine, most shellfish  allergies are due to two protein allergens present in the shellfish: tropomyosins and parvalbumin. Not all patients with shellfish allergies are allergic to iodine. However, as a precaution, avoid using iodine containing products.    Standing Status:   Standing    Number of Occurrences:   1   Chronic Opioid Analgesic:  Morphine ER (MS Contin) 30 mg, 1 tab p.o. twice daily (60 mg/day).  (Previously on oxycodone IR 10 mg every 6 hours (40 mg/day)) MME/day: 60 mg/day.   Medications ordered for procedure: Meds ordered this encounter  Medications   lidocaine (XYLOCAINE) 2 % (with pres) injection 400 mg   pentafluoroprop-tetrafluoroeth (GEBAUERS) aerosol   glycopyrrolate (ROBINUL) injection 0.2 mg   midazolam (VERSED) 5 MG/5ML injection 0.5-2 mg    Make sure Flumazenil is  available in the pyxis when using this medication. If oversedation occurs, administer 0.2 mg IV over 15 sec. If after 45 sec no response, administer 0.2 mg again over 1 min; may repeat at 1 min intervals; not to exceed 4 doses (1 mg)   fentaNYL (SUBLIMAZE) injection 25-50 mcg    Make sure Narcan is available in the pyxis when using this medication. In the event of respiratory depression (RR< 8/min): Titrate NARCAN (naloxone) in increments of 0.1 to 0.2 mg IV at 2-3 minute intervals, until desired degree of reversal.   ropivacaine (PF) 2 mg/mL (0.2%) (NAROPIN) injection 18 mL   triamcinolone acetonide (KENALOG-40) injection 80 mg   Medications administered: We administered lidocaine, pentafluoroprop-tetrafluoroeth, midazolam, fentaNYL, ropivacaine (PF) 2 mg/mL (0.2%), and triamcinolone acetonide.  See the medical record for exact dosing, route, and time of administration.  Follow-up plan:   Return in about 2 weeks (around 05/04/2023) for (Face2F), (PPE).       Interventional Therapies  Risk Factors  Considerations:   Allergy: CONTRAST, IODINE, & shellfish  VASOVAGAL RXN BNZ Tx   MO (L-FCT 5" needle to the hub)      NO MORE RFA - intraoperative noncompliance, unnecessarily increasing radiation exposure NOTE: NO MORE RFA procedures. Difficulty following intra-procedural instructions and commands, resulting in unnecessarily long exposure of staff to radiation.    Planned  Pending:   Therapeutic bilateral lumbar facet MBB #5    Under consideration:   Diagnostic Left GONB    Completed:   Therapeutic bilateral lumbar facet (L2-S1) MBB x4 (04/14/2022) (100/100/80/80)  Therapeutic left lumbar facet (L2-S1 MB) RFA x2 (05/09/2019) (90/90/77/>75)  Therapeutic right lumbar facet (L2-S1 MB) RFA x6 (12/14/2019) (100/100/70/70) (No intra-op compliance)  Diagnostic/therapeutic right small joint inj. (thumb) x1 (03/15/2018) (N/A)  Diagnostic/therapeutic left cervical ESI x1 (12/18/2015)  (90/70/50/50)    Completed by other providers:   None at this time   Therapeutic  Palliative (PRN) options:   Palliative lumbar facet MBB  Therapeutic cervical ESI    Pharmacotherapy  Nonopioids transferred 12/14/2019: Gabapentin, Amitiza, Benefiber, Mobic      Recent Visits Date Type Provider Dept  03/31/23 Office Visit Delano Metz, MD [facility]  Showing recent visits within past 90 days and meeting all other requirements Today's Visits Date Type Provider Dept  04/20/23 Procedure visit Delano Metz, MD [facility]  Showing today's visits and meeting all other requirements Future Appointments Date Type Provider Dept  05/06/23 Appointment Delano Metz, MD [facility]  06/30/23 Appointment Delano Metz, MD [facility]  Showing future appointments within next 90 days and meeting all other requirements  Disposition: Discharge home  Discharge (Date  Time): 04/20/2023; 1014 hrs.   Primary  Care Physician: Sherron Monday, MD Location: [facility] Note by: Oswaldo Done, MD (TTS technology used. I apologize for any typographical errors that were not detected and corrected.) Date: 04/20/2023; Time: 11:32 AM  Disclaimer:  Medicine is not an Visual merchandiser. The only guarantee in medicine is that nothing is guaranteed. It is important to note that the decision to proceed with this intervention was based on the information collected from the patient. The Data and conclusions were drawn from the patient's questionnaire, the interview, and the physical examination. Because the information was provided in large part by the patient, it cannot be guaranteed that it has not been purposely or unconsciously manipulated. Every effort has been made to obtain as much relevant data as possible for this evaluation. It is important to note that the conclusions that lead to this procedure are derived in  large part from the available data. Always take into account that the treatment will also be dependent on availability of resources and existing treatment guidelines, considered by other Pain Management Practitioners as being common knowledge and practice, at the time of the intervention. For Medico-Legal purposes, it is also important to point out that variation in procedural techniques and pharmacological choices are the acceptable norm. The indications, contraindications, technique, and results of the above procedure should only be interpreted and judged by a Board-Certified Interventional Pain Specialist with extensive familiarity and expertise in the same exact procedure and technique.

## 2023-04-20 ENCOUNTER — Ambulatory Visit
Admission: RE | Admit: 2023-04-20 | Discharge: 2023-04-20 | Disposition: A | Discharge status: Home / Self Care | Source: Ambulatory Visit | Attending: Pain Medicine | Admitting: Pain Medicine

## 2023-04-20 ENCOUNTER — Ambulatory Visit: Payer: 59 | Attending: Pain Medicine | Admitting: Pain Medicine

## 2023-04-20 ENCOUNTER — Encounter: Payer: Self-pay | Admitting: Pain Medicine

## 2023-04-20 VITALS — BP 122/76 | HR 86 | Temp 97.4°F | Resp 14 | Ht 72.0 in | Wt 242.0 lb

## 2023-04-20 DIAGNOSIS — M5459 Other low back pain: Secondary | ICD-10-CM

## 2023-04-20 DIAGNOSIS — R937 Abnormal findings on diagnostic imaging of other parts of musculoskeletal system: Secondary | ICD-10-CM

## 2023-04-20 DIAGNOSIS — Z883 Allergy status to other anti-infective agents status: Secondary | ICD-10-CM

## 2023-04-20 DIAGNOSIS — M47817 Spondylosis without myelopathy or radiculopathy, lumbosacral region: Secondary | ICD-10-CM | POA: Diagnosis not present

## 2023-04-20 DIAGNOSIS — R55 Syncope and collapse: Secondary | ICD-10-CM

## 2023-04-20 DIAGNOSIS — G8929 Other chronic pain: Secondary | ICD-10-CM | POA: Insufficient documentation

## 2023-04-20 DIAGNOSIS — M5136 Other intervertebral disc degeneration, lumbar region with discogenic back pain only: Secondary | ICD-10-CM

## 2023-04-20 DIAGNOSIS — Z91041 Radiographic dye allergy status: Secondary | ICD-10-CM | POA: Diagnosis present

## 2023-04-20 DIAGNOSIS — M47816 Spondylosis without myelopathy or radiculopathy, lumbar region: Secondary | ICD-10-CM | POA: Diagnosis present

## 2023-04-20 DIAGNOSIS — M545 Low back pain, unspecified: Secondary | ICD-10-CM | POA: Diagnosis not present

## 2023-04-20 DIAGNOSIS — Z91013 Allergy to seafood: Secondary | ICD-10-CM | POA: Diagnosis present

## 2023-04-20 MED ORDER — LIDOCAINE HCL 2 % IJ SOLN
20.0000 mL | Freq: Once | INTRAMUSCULAR | Status: AC
  Administered 2023-04-20: 400 mg

## 2023-04-20 MED ORDER — PENTAFLUOROPROP-TETRAFLUOROETH EX AERO
INHALATION_SPRAY | Freq: Once | CUTANEOUS | Status: AC
  Administered 2023-04-20: 30 via TOPICAL

## 2023-04-20 MED ORDER — ROPIVACAINE HCL 2 MG/ML IJ SOLN
18.0000 mL | Freq: Once | INTRAMUSCULAR | Status: AC
  Administered 2023-04-20: 18 mL via PERINEURAL

## 2023-04-20 MED ORDER — MIDAZOLAM HCL 5 MG/5ML IJ SOLN
INTRAMUSCULAR | Status: AC
  Filled 2023-04-20: qty 5

## 2023-04-20 MED ORDER — GLYCOPYRROLATE 0.2 MG/ML IJ SOLN: 0.2000 mg | Freq: Once | INTRAMUSCULAR | Status: DC

## 2023-04-20 MED ORDER — GLYCOPYRROLATE 0.2 MG/ML IJ SOLN
INTRAMUSCULAR | Status: AC
  Filled 2023-04-20: qty 1

## 2023-04-20 MED ORDER — TRIAMCINOLONE ACETONIDE 40 MG/ML IJ SUSP
80.0000 mg | Freq: Once | INTRAMUSCULAR | Status: AC
  Administered 2023-04-20: 80 mg

## 2023-04-20 MED ORDER — ROPIVACAINE HCL 2 MG/ML IJ SOLN
INTRAMUSCULAR | Status: AC
  Filled 2023-04-20: qty 20

## 2023-04-20 MED ORDER — LIDOCAINE HCL 2 % IJ SOLN
INTRAMUSCULAR | Status: AC
  Filled 2023-04-20: qty 20

## 2023-04-20 MED ORDER — FENTANYL CITRATE (PF) 100 MCG/2ML IJ SOLN
INTRAMUSCULAR | Status: AC
  Filled 2023-04-20: qty 2

## 2023-04-20 MED ORDER — TRIAMCINOLONE ACETONIDE 40 MG/ML IJ SUSP
INTRAMUSCULAR | Status: AC
  Filled 2023-04-20: qty 2

## 2023-04-20 MED ORDER — MIDAZOLAM HCL 5 MG/5ML IJ SOLN
0.5000 mg | Freq: Once | INTRAMUSCULAR | Status: AC
Start: 2023-04-20 — End: 2023-04-20
  Administered 2023-04-20: 2 mg via INTRAVENOUS

## 2023-04-20 MED ORDER — FENTANYL CITRATE (PF) 100 MCG/2ML IJ SOLN
25.0000 ug | INTRAMUSCULAR | Status: DC | PRN
  Administered 2023-04-20: 50 ug via INTRAVENOUS

## 2023-04-20 NOTE — Progress Notes (Signed)
 Safety precautions to be maintained throughout the outpatient stay will include: orient to surroundings, keep bed in low position, maintain call bell within reach at all times, provide assistance with transfer out of bed and ambulation.

## 2023-04-20 NOTE — Patient Instructions (Addendum)

## 2023-04-21 ENCOUNTER — Telehealth: Payer: Self-pay

## 2023-04-21 NOTE — Telephone Encounter (Signed)
 Attempt to contact for post-procedure f/u. No answer and LVM.

## 2023-04-22 ENCOUNTER — Telehealth: Payer: Self-pay | Admitting: Internal Medicine

## 2023-04-22 NOTE — Telephone Encounter (Signed)
 Copied from CRM [id number]. Topic: General - Other >> Apr 21, 2023  3:41 PM Gurney Maxin H wrote: Reason for CRM: Kathie Rhodes is calling to verify if office receive clinical recommendation sent to office via fax on 3/3, Kathie Rhodes states she will refax document to office today.   Kathie Rhodes [phone]

## 2023-04-23 NOTE — Telephone Encounter (Signed)
 Called and let them know that he have not received any fax for this pt. They stated they will refax over form for Dr. To sign.

## 2023-04-26 ENCOUNTER — Encounter: Payer: Self-pay | Admitting: Cardiology

## 2023-04-26 ENCOUNTER — Ambulatory Visit: Payer: 59 | Attending: Cardiology | Admitting: Cardiology

## 2023-04-26 VITALS — BP 116/74 | HR 79 | Ht 73.0 in | Wt 246.8 lb

## 2023-04-26 DIAGNOSIS — E7800 Pure hypercholesterolemia, unspecified: Secondary | ICD-10-CM | POA: Diagnosis not present

## 2023-04-26 DIAGNOSIS — I10 Essential (primary) hypertension: Secondary | ICD-10-CM | POA: Diagnosis not present

## 2023-04-26 DIAGNOSIS — R079 Chest pain, unspecified: Secondary | ICD-10-CM

## 2023-04-26 DIAGNOSIS — R600 Localized edema: Secondary | ICD-10-CM | POA: Diagnosis not present

## 2023-04-26 NOTE — Progress Notes (Signed)
 Cardiology Office Note:    Date:  04/26/2023   ID:  Alan Schmidt, DOB 07/13/1945, MRN [id number]  PCP:  Sherron Monday, MD   [facility] Providers Cardiologist:  None     Referring MD: Glori Luis, MD   No chief complaint on file.   History of Present Illness:    Alan Schmidt is a 78 y.o. male with a hx of hypertension, hyperlipidemia, diabetes, former smoker, chronic back pain presenting due to leg edema and chest pain.  States having chest pain 2 days ago while laying in bed.  Symptoms occurred in the context of indigestion.  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  Symptoms resolved with changing positions.  Denies any prior symptoms, has not had any since.  Also endorses leg edema over the past several weeks.  He did some leg exercises with resolution of edema.  He has chronic back pain and hip pain due to several surgeries involving the right hip.  Denies any history of heart disease.  Past Medical History:  Diagnosis Date   Acute postoperative pain 11/24/2016   Anxiety    Chronic hip pain (Right) 12/05/2014   Chronic lumbar pain    Depression    Diabetes mellitus without complication (HCC)    Hyperlipidemia    Hypertension    Kidney stones    Migraines     Past Surgical History:  Procedure Laterality Date   right hip surgery     4 surgeries   TONSILLECTOMY     TOTAL HIP ARTHROPLASTY      Current Medications: Current Meds  Medication Sig   ACCU-CHEK GUIDE test strip USE AS INSTRUCTED DX CODE: E11.9   acetaminophen (TYLENOL) 500 MG tablet Take 500 mg by mouth every 6 (six) hours as needed.   ALPRAZolam (XANAX) 1 MG tablet TAKE 1/2 TABLET BY MOUTH 2 TIMES DAILY AS NEEDED FOR ANXIETY.   aspirin EC 81 MG tablet Take 81 mg by mouth daily.   buPROPion (WELLBUTRIN XL) 150 MG 24 hr tablet TAKE 1 TABLET (150 MG TOTAL) BY MOUTH DAILY FOR 7 DAYS, THEN 2 TABLETS (300 MG TOTAL) DAILY.   busPIRone (BUSPAR) 10 MG tablet Take  1 tablet (10 mg total) by mouth 3 (three) times daily.   carvedilol (COREG) 25 MG tablet Take 1 tablet by mouth twice daily.   Continuous Blood Gluc Receiver (FREESTYLE LIBRE 14 DAY READER) DEVI 1 Device by Does not apply route daily. Use to scan to check blood sugar up to 7 times daily; E11.42,   Continuous Glucose Sensor (FREESTYLE LIBRE 14 DAY SENSOR) MISC 1 Device by Does not apply route every 14 (fourteen) days. E11.42   desoximetasone (TOPICORT) 0.25 % cream APPLY CREAM TO AFFECTED AREA TWO TIMES DAILY, FOR UP TO 7 DAYS, DO NOT APPLY TO FACE   Strength: 0.25 %   empagliflozin (JARDIANCE) 25 MG TABS tablet Take 1 tablet (25 mg total) by mouth daily.   EPINEPHrine (EPIPEN 2-PAK) 0.3 mg/0.3 mL IJ SOAJ injection Inject 0.3 mg into the muscle as needed for anaphylaxis.   escitalopram (LEXAPRO) 20 MG tablet Take 1 tablet (20 mg total) by mouth daily.   gabapentin (NEURONTIN) 250 MG/5ML solution Take 6 ml by mouth at bedtime.   hydrochlorothiazide (HYDRODIURIL) 25 MG tablet Take 1 tablet (25 mg total) by mouth daily.   insulin lispro (HUMALOG KWIKPEN) 100 UNIT/ML KwikPen INJECT 5 TO 7 UNITS UNDER THE SKIN THREE TIMES DAILY WITH MEALS  Insulin Pen Needle (BD PEN NEEDLE NANO U/F) 32G X 4 MM MISC USE EVERY DAY   LANTUS SOLOSTAR 100 UNIT/ML Solostar Pen Inject 8 Units into the skin daily.   loratadine (CLARITIN) 10 MG tablet Take 1 tablet by mouth daily.   metFORMIN (GLUCOPHAGE) 1000 MG tablet Take 1 tablet (1,000 mg total) by mouth 2 (two) times daily with a meal.   methocarbamol (ROBAXIN) 500 MG tablet Take 500 mg by mouth 2 (two) times daily.   mometasone (NASONEX) 50 MCG/ACT nasal spray Instill 2 sprays into each nostril once daily.   morphine (MS CONTIN) 15 MG 12 hr tablet Take 1 tablet (15 mg total) by mouth every 8 (eight) hours. Must last 30 days. Do not break tablet   [START ON 05/05/2023] morphine (MS CONTIN) 15 MG 12 hr tablet Take 1 tablet (15 mg total) by mouth every 8 (eight) hours. Must  last 30 days. Do not break tablet   olmesartan (BENICAR) 40 MG tablet Take 1 tablet (40 mg total) by mouth daily.   pantoprazole (PROTONIX) 40 MG tablet Take 1 tablet (40 mg total) by mouth daily.   rosuvastatin (CRESTOR) 40 MG tablet Take 1 tablet by mouth daily.   sennosides-docusate sodium (SENOKOT-S) 8.6-50 MG tablet Take 1 tablet by mouth daily as needed.    tamsulosin (FLOMAX) 0.4 MG CAPS capsule Take 2 capsules (0.8 mg total) by mouth daily after breakfast.   tirzepatide (MOUNJARO) 7.5 MG/0.5ML Pen Inject 7.5 mg into the skin once a week.     Allergies:   Contrast media [iodinated contrast media], Iodine, and Shellfish allergy   Social History   Socioeconomic History   Marital status: Divorced    Spouse name: 1 Bio; 6 Adopted   Number of children: 7   Years of education: doctorate   Highest education level: Doctorate  Occupational History   Occupation: Retired  Tobacco Use   Smoking status: Former    Types: Cigarettes   Smokeless tobacco: Never  Vaping Use   Vaping status: Never Used  Substance and Sexual Activity   Alcohol use: No    Alcohol/week: 0.0 standard drinks of alcohol   Drug use: No   Sexual activity: Not Currently  Other Topics Concern   Not on file  Social History Narrative   Per patient he has 1 biological child and 6 adopted children   Social Drivers of Corporate investment banker Strain: High Risk (03/31/2023)   Received from [facility]   Overall Financial Resource Strain (CARDIA)    Difficulty of Paying Living Expenses: Very hard  Food Insecurity: Food Insecurity Present (03/31/2023)   Received from [facility]   Hunger Vital Sign    Worried About Running Out of Food in the Last Year: Sometimes true    Ran Out of Food in the Last Year: Sometimes true  Transportation Needs: Unmet Transportation Needs (03/31/2023)   Received from [facility]   PRAPARE - Transportation    In the past 12 months,  has lack of transportation kept you from medical appointments or from getting medications?: Yes    Lack of Transportation (Non-Medical): Yes  Physical Activity: Inactive (02/16/2023)   Exercise Vital Sign    Days of Exercise per Week: 0 days    Minutes of Exercise per Session: 0 min  Stress: No Stress Concern Present (02/16/2023)   [employer] of Occupational Health - Occupational Stress Questionnaire    Feeling of Stress : Only a  little  Social Connections: Socially Isolated (02/16/2023)   Social Connection and Isolation Panel [NHANES]    Frequency of Communication with Friends and Family: More than three times a week    Frequency of Social Gatherings with Friends and Family: Never    Attends Religious Services: Never    Database administrator or Organizations: No    Attends Engineer, structural: Never    Marital Status: Divorced     Family History: The patient's family history includes Cancer in his mother; Diabetes in his father, sister, and sister; Heart disease in his father; Stroke in his father. There is no history of Mental illness.  ROS:   Please see the history of present illness.     All other systems reviewed and are negative.  EKGs/Labs/Other Studies Reviewed:    The following studies were reviewed today:  EKG Interpretation Date/Time:  Monday April 26 2023 14:26:10 EDT Ventricular Rate:  79 PR Interval:  146 QRS Duration:  92 QT Interval:  394 QTC Calculation: 451 R Axis:   8  Text Interpretation: Normal sinus rhythm Normal ECG Confirmed by Debbe Odea ([id number]) on 04/26/2023 2:35:03 PM    Recent Labs: 09/22/2022: ALT 20; BUN 8; Creatinine, Ser 0.85; Hemoglobin 13.4; Platelets 212.0; Potassium 3.8; Pro B Natriuretic peptide (BNP) 23.0; Sodium 140; TSH 3.52  Recent Lipid Panel    Component Value Date/Time   CHOL 120 06/08/2022 1525   CHOL 141 03/22/2020 1053   TRIG 85.0 06/08/2022 1525   HDL 55.70 06/08/2022 1525   HDL 55 03/22/2020 1053    CHOLHDL 2 06/08/2022 1525   VLDL 17.0 06/08/2022 1525   LDLCALC 47 06/08/2022 1525   LDLCALC 74 04/25/2021 1429   LDLDIRECT 53.0 06/17/2021 0739     Risk Assessment/Calculations:             Physical Exam:    VS:  BP 116/74   Pulse 79   Ht 6\' 1"  (1.854 m)   Wt 246 lb 12.8 oz (111.9 kg)   SpO2 93%   BMI 32.56 kg/m     Wt Readings from Last 3 Encounters:  04/26/23 246 lb 12.8 oz (111.9 kg)  04/20/23 242 lb (109.8 kg)  04/09/23 242 lb (109.8 kg)     GEN:  Well nourished, well developed in no acute distress HEENT: Normal NECK: No JVD; No carotid bruits CARDIAC: RRR, no murmurs, rubs, gallops RESPIRATORY:  Clear to auscultation without rales, wheezing or rhonchi  ABDOMEN: Soft, non-tender, non-distended MUSCULOSKELETAL:  No edema; No deformity  SKIN: Warm and dry NEUROLOGIC:  Alert and oriented x 3 PSYCHIATRIC:  Normal affect   ASSESSMENT:    1. Chest pain, unspecified type   2. Primary hypertension   3. Pure hypercholesterolemia   4. Leg edema    PLAN:    In order of problems listed above:  Chest pain, appears atypical, associated with indigestion.  Obtain echo, no plans for ischemic workup at this time unless symptoms return or persist. Hypertension, BP controlled.  Continue Coreg 25 mg twice daily, olmesartan 40 mg daily, HCTZ 25 mg daily. Hyperlipidemia, cholesterol controlled.  Continue Crestor 40 mg daily. Leg edema, appears dependent, currently resolved.  Leg raising advised.  Follow-up after echo.       Medication Adjustments/Labs and Tests Ordered: Current medicines are reviewed at length with the patient today.  Concerns regarding medicines are outlined above.  Orders Placed This Encounter  Procedures   EKG 12-Lead   ECHOCARDIOGRAM COMPLETE  No orders of the defined types were placed in this encounter.   Patient Instructions  Medication Instructions:  Your physician recommends that you continue on your current medications as directed.  Please refer to the Current Medication list given to you today.  *If you need a refill on your cardiac medications before your next appointment, please call your pharmacy*   Lab Work: None If you have labs (blood work) drawn today and your tests are completely normal, you will receive your results only by: MyChart Message (if you have MyChart) OR A paper copy in the mail If you have any lab test that is abnormal or we need to change your treatment, we will call you to review the results.   Testing/Procedures: Your physician has requested that you have an echocardiogram. Echocardiography is a painless test that uses sound waves to create images of your heart. It provides your doctor with information about the size and shape of your heart and how well your heart's chambers and valves are working. This procedure takes approximately one hour. There are no restrictions for this procedure. Please do NOT wear cologne, perfume, aftershave, or lotions (deodorant is allowed). Please arrive 15 minutes prior to your appointment time.  Please note: We ask at that you not bring children with you during ultrasound (echo/ vascular) testing. Due to room size and safety concerns, children are not allowed in the ultrasound rooms during exams. Our front office staff cannot provide observation of children in our lobby area while testing is being conducted. An adult accompanying a patient to their appointment will only be allowed in the ultrasound room at the discretion of the ultrasound technician under special circumstances. We apologize for any inconvenience.   Follow-Up: At [facility], you and your health needs are our priority.  As part of our continuing mission to provide you with exceptional heart care, we have created designated Provider Care Teams.  These Care Teams include your primary Cardiologist (physician) and Advanced Practice Providers (APPs -  Physician Assistants and Nurse Practitioners)  who all work together to provide you with the care you need, when you need it.  We recommend signing up for the patient portal called "MyChart".  Sign up information is provided on this After Visit Summary.  MyChart is used to connect with patients for Virtual Visits (Telemedicine).  Patients are able to view lab/test results, encounter notes, upcoming appointments, etc.  Non-urgent messages can be sent to your provider as well.   To learn more about what you can do with MyChart, go to [email or web address].    Your next appointment:   3 month(s)  Provider:   Dr. Myriam Forehand - Sandie Ano  Other Instructions None       Signed, Debbe Odea, MD  04/26/2023 3:19 PM    [facility]

## 2023-04-26 NOTE — Patient Instructions (Signed)
 Medication Instructions:  Your physician recommends that you continue on your current medications as directed. Please refer to the Current Medication list given to you today.  *If you need a refill on your cardiac medications before your next appointment, please call your pharmacy*   Lab Work: None If you have labs (blood work) drawn today and your tests are completely normal, you will receive your results only by: MyChart Message (if you have MyChart) OR A paper copy in the mail If you have any lab test that is abnormal or we need to change your treatment, we will call you to review the results.   Testing/Procedures: Your physician has requested that you have an echocardiogram. Echocardiography is a painless test that uses sound waves to create images of your heart. It provides your doctor with information about the size and shape of your heart and how well your heart's chambers and valves are working. This procedure takes approximately one hour. There are no restrictions for this procedure. Please do NOT wear cologne, perfume, aftershave, or lotions (deodorant is allowed). Please arrive 15 minutes prior to your appointment time.  Please note: We ask at that you not bring children with you during ultrasound (echo/ vascular) testing. Due to room size and safety concerns, children are not allowed in the ultrasound rooms during exams. Our front office staff cannot provide observation of children in our lobby area while testing is being conducted. An adult accompanying a patient to their appointment will only be allowed in the ultrasound room at the discretion of the ultrasound technician under special circumstances. We apologize for any inconvenience.   Follow-Up: At [facility], you and your health needs are our priority.  As part of our continuing mission to provide you with exceptional heart care, we have created designated Provider Care Teams.  These Care Teams include your primary  Cardiologist (physician) and Advanced Practice Providers (APPs -  Physician Assistants and Nurse Practitioners) who all work together to provide you with the care you need, when you need it.  We recommend signing up for the patient portal called "MyChart".  Sign up information is provided on this After Visit Summary.  MyChart is used to connect with patients for Virtual Visits (Telemedicine).  Patients are able to view lab/test results, encounter notes, upcoming appointments, etc.  Non-urgent messages can be sent to your provider as well.   To learn more about what you can do with MyChart, go to [email or web address].    Your next appointment:   3 month(s)  Provider:   Dr. Myriam Forehand - Sandie Ano  Other Instructions None

## 2023-05-05 NOTE — Progress Notes (Unsigned)
 PROVIDER NOTE: Information contained herein reflects review and annotations entered in association with encounter. Interpretation of such information and data should be left to medically-trained personnel. Information provided to patient can be located elsewhere in the medical record under "Patient Instructions". Document created using STT-dictation technology, any transcriptional errors that may result from process are unintentional.    Patient: Alan Schmidt  Service Category: E/M  Provider: Oswaldo Done, MD  DOB: Mar 19, 1945  DOS: 05/06/2023  Referring Provider: Sherron Monday, MD  MRN: [id number]  Specialty: Interventional Pain Management  PCP: Sherron Monday, MD  Type: Established Patient Encounter  Setting: Ambulatory outpatient    Location: Office  Delivery: Face-to-face     HPI  [name], a 78 y.o. year old male, is here today because of his Chronic bilateral low back pain without sciatica [M54.50, G89.29]. Mr. Bilotta primary complain today is No chief complaint on file.  Pertinent problems: Mr. Ang has Chronic low back pain (Bilateral) (R>L) w/ sciatica (Bilateral); Lumbar facet syndrome (Bilateral) (R>L); Lumbar spondylosis; Diabetic peripheral neuropathy (HCC); Chronic neck pain (midline over the C7 spinous processes) (L>R); Neurogenic pain; Neuropathic pain; Chronic lower extremity pain (2ry area of Pain) (Bilateral) (R>L); 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) (Right L5 dermatome); History of total hip replacement (Right); Chronic hip pain (Right); Abnormal nerve conduction studies (severe bilateral lower extremity polyneuropathy); Chronic shoulder pain (Bilateral) (R>L); Occipital neuralgia (Left); Cervicogenic headache (Left); Chronic shoulder radicular pain (Left); Chronic cervical radicular pain (Left); Chronic pain syndrome; Lumbar facet joint osteoarthritis (Bilateral); Chronic headache; Chronic tension-type headache, intractable;  Coccygodynia; DDD (degenerative disc disease), lumbar; Chronic musculoskeletal pain; Chronic knee pain (Bilateral); Thumb pain (Right); 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); Spondylosis without myelopathy or radiculopathy, lumbosacral region; Osteoarthritis involving multiple joints; Shortened hamstring muscle; Chronic low back pain (1ry area of Pain) (Bilateral) (R>L) w/o sciatica; DDD (degenerative disc disease), cervical; Bilateral lower extremity edema; Bilateral leg weakness; Musculoskeletal chest pain; Abnormal MRI, lumbar spine (06/02/2022); Trigger point of shoulder region (Left); Musculoskeletal disorder involving upper trapezius muscle (Left); Left thigh pain; Chronic hip pain (Left); Impaired range of motion of hip (Left); Physical deconditioning; Spinal stenosis, lumbar region, 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 Pain Assessment: Severity of   is reported as a  /10. Location:    / . Onset:  . Quality:  . Timing:  . Modifying factor(s):  Marland Kitchen Vitals:  vitals were not taken for this visit.  BMI: Estimated body mass index is 32.56 kg/m as calculated from the following:   Height as of 04/26/23: 6\' 1"  (1.854 m).   Weight as of 04/26/23: 246 lb 12.8 oz (111.9 kg). Last encounter: 03/31/2023. Last procedure: 04/20/2023.  Reason for encounter: post-procedure evaluation and assessment. ***  Discussed the use of AI scribe software for clinical note transcription with the patient, who gave verbal consent to proceed.  History of Present Illness          Post-procedure evaluation   Type: Lumbar Facet, Medial Branch Block(s)   #5  Laterality: Bilateral  Level: L2, L3, L4, L5, and S1 Medial Branch Level(s). Injecting these levels blocks the L3-4, L4-5, and L5-S1 lumbar facet joints. Imaging: Fluoroscopic guidance  Spinal ([id number]) Anesthesia: Local anesthesia (1-2% Lidocaine) Anxiolysis: IV Versed         Sedation: Moderate Sedation  DOS: 04/20/2023 Performed by: Oswaldo Done, MD  Primary Purpose: Diagnostic/Therapeutic Indications: Low back pain severe enough to impact quality of life or function. 1. Chronic low back pain (1ry area of Pain) (Bilateral) (R>L) w/o sciatica   2. Degeneration of intervertebral disc of lumbar region with discogenic back pain   3. Lumbar facet joint osteoarthritis (Bilateral)   4. Lumbar facet syndrome (Bilateral) (R>L)   5. Spondylosis without myelopathy or radiculopathy, lumbosacral region   6. Lumbar facet joint pain   7. Osteoarthritis of lumbar spine   8. Lumbar facet arthropathy (Multilevel) (Bilateral)   9. Abnormal MRI, lumbar spine (06/02/2022)    NAS-11 Pain score:   Pre-procedure: 5 /10   Post-procedure: 0-No pain/10    Effectiveness:  Initial hour after procedure:   ***. Subsequent 4-6 hours post-procedure:   ***. Analgesia past initial 6 hours:   ***. Ongoing improvement:  Analgesic:  *** Function:    ***    ROM:    ***      Pharmacotherapy Assessment  Analgesic: Morphine ER (MS Contin) 30 mg, 1 tab p.o. twice daily (60 mg/day).  (Previously on oxycodone IR 10 mg every 6 hours (40 mg/day)) MME/day: 60 mg/day.   Monitoring: [place] PMP: PDMP reviewed during this encounter.       Pharmacotherapy: No side-effects or adverse reactions reported. Compliance: No problems identified. Effectiveness: Clinically acceptable.  No notes on file  No results found for: "CBDTHCR" No results found for: "D8THCCBX" No results found for: "D9THCCBX"  UDS:  Summary  Date Value Ref Range Status  07/08/2022 Note  Final    Comment:    ==================================================================== [employer] Select 13 (MW) ==================================================================== Test                              Result       Flag       Units  Drug Present and Declared for Prescription Verification   Alprazolam                     58           EXPECTED   ng/mg creat   Alpha-hydroxyalprazolam        142          EXPECTED   ng/mg creat    Source of alprazolam is a scheduled prescription medication. Alpha-    hydroxyalprazolam is an expected metabolite of alprazolam.    Morphine                       15430        EXPECTED   ng/mg creat   Normorphine                    570          EXPECTED   ng/mg creat    Potential sources of large amounts of morphine in the absence of    codeine include administration of morphine or use of heroin.     Normorphine is an expected metabolite of morphine.    Hydromorphone                  362          EXPECTED   ng/mg creat    Hydromorphone may be present as a metabolite of morphine;    concentrations of hydromorphone rarely exceed 5% of the morphine  concentration when this is the source of hydromorphone.  ==================================================================== Test                      Result    Flag   Units      Ref Range   Creatinine              50               mg/dL      >=10 ==================================================================== Declared Medications:  The flagging and interpretation on this report are based on the  following declared medications.  Unexpected results may arise from  inaccuracies in the declared medications.   **Note: The testing scope of this panel includes these medications:   Alprazolam  Morphine (MS Contin)   **Note: The testing scope of this panel does not include the  following reported medications:   Acetaminophen (Tylenol)  Aspirin  Bupropion (Wellbutrin XL)  Carvedilol (Coreg)  Desoximetasone (Topicort)  Empagliflozin (Jardiance)  Epinephrine (EpiPen)  Escitalopram (Lexapro)  Gabapentin (Neurontin)  Hydrochlorothiazide  Insulin Evaristo Bury)  Loratadine (Claritin)  Lubiprostone (Amitiza)   Metformin  Methocarbamol  Mometasone (Nasonex)  Naloxone (Narcan)  Olmesartan (Benicar)  Pantoprazole (Protonix)  Rosuvastatin (Crestor)  Supplement (Fiber)  Tamsulosin (Flomax)  Tirzepatide (Mounjaro) ==================================================================== For clinical consultation, please call [phone]. ====================================================================       ROS  Constitutional: Denies any fever or chills Gastrointestinal: No reported hemesis, hematochezia, vomiting, or acute GI distress Musculoskeletal: Denies any acute onset joint swelling, redness, loss of ROM, or weakness Neurological: No reported episodes of acute onset apraxia, aphasia, dysarthria, agnosia, amnesia, paralysis, loss of coordination, or loss of consciousness  Medication Review  ALPRAZolam, Benefiber, EPINEPHrine, FreeStyle Libre 14 Day Reader, FreeStyle Libre 14 Day Sensor, Insulin Pen Needle, acetaminophen, aspirin EC, buPROPion, busPIRone, carvedilol, desoximetasone, empagliflozin, escitalopram, gabapentin, glucose blood, hydrochlorothiazide, insulin glargine, insulin lispro, loratadine, lubiprostone, metFORMIN, methocarbamol, mometasone, morphine, naloxone, olmesartan, pantoprazole, rosuvastatin, sennosides-docusate sodium, tamsulosin, and tirzepatide  History Review  Allergy: Mr. Swinger is allergic to contrast media [iodinated contrast media], iodine, and shellfish allergy. Drug: Mr. Krenz  reports no history of drug use. Alcohol:  reports no history of alcohol use. Tobacco:  reports that he has quit smoking. His smoking use included cigarettes. He has never used smokeless tobacco. Social: Mr. Yabut  reports that he has quit smoking. His smoking use included cigarettes. He has never used smokeless tobacco. He reports that he does not drink alcohol and does not use drugs. Medical:  has a past medical history of Acute postoperative pain (11/24/2016), Anxiety,  Chronic hip pain (Right) (12/05/2014), Chronic lumbar pain, Depression, Diabetes mellitus without complication (HCC), Hyperlipidemia, Hypertension, Kidney stones, and Migraines. Surgical: Mr. Krummel  has a past surgical history that includes Total hip arthroplasty; Tonsillectomy; and right hip surgery. Family: family history includes Cancer in his mother; Diabetes in his father, sister, and sister; Heart disease in his father; Stroke in his father.  Laboratory Chemistry Profile   Renal Lab Results  Component Value Date   BUN 8 09/22/2022   CREATININE 0.85 09/22/2022   BCR NOT APPLICABLE 04/25/2021   GFR 84.35 09/22/2022   GFRAA >60 03/26/2018   GFRNONAA >60 03/26/2018    Hepatic Lab Results  Component Value Date   AST 21 09/22/2022   ALT 20 09/22/2022   ALBUMIN 3.9 09/22/2022   ALKPHOS 83 09/22/2022    Electrolytes Lab Results  Component Value Date   NA 140 09/22/2022   K 3.8 09/22/2022  CL 99 09/22/2022   CALCIUM 9.2 09/22/2022   MG 1.8 03/06/2015    Bone No results found for: "VD25OH", "VD125OH2TOT", "RU0454UJ8", "JX9147WG9", "25OHVITD1", "25OHVITD2", "25OHVITD3", "TESTOFREE", "TESTOSTERONE"  Inflammation (CRP: Acute Phase) (ESR: Chronic Phase) Lab Results  Component Value Date   CRP 0.5 03/06/2015   ESRSEDRATE 45 (H) 12/14/2016         Note: Above Lab results reviewed.  Recent Imaging Review  DG PAIN CLINIC C-ARM 1-60 MIN NO REPORT Fluoro was used, but no Radiologist interpretation will be provided.  Please refer to "NOTES" tab for provider progress note. Note: Reviewed        Physical Exam  General appearance: Well nourished, well developed, and well hydrated. In no apparent acute distress Mental status: Alert, oriented x 3 (person, place, & time)       Respiratory: No evidence of acute respiratory distress Eyes: PERLA Vitals: There were no vitals taken for this visit. BMI: Estimated body mass index is 32.56 kg/m as calculated from the following:    Height as of 04/26/23: 6\' 1"  (1.854 m).   Weight as of 04/26/23: 246 lb 12.8 oz (111.9 kg). Ideal: Ideal body weight: 79.9 kg (176 lb 2.4 oz) Adjusted ideal body weight: 92.7 kg (204 lb 6.5 oz)  Assessment   Diagnosis Status  1. Chronic low back pain (1ry area of Pain) (Bilateral) (R>L) w/o sciatica   2. Lumbar facet joint pain   3. Lumbar facet syndrome (Bilateral) (R>L)   4. Postop check    Controlled Controlled Controlled   Updated Problems: No problems updated.  Plan of Care  Problem-specific:  Assessment and Plan            Mr. ALGIS LEHENBAUER has a current medication list which includes the following long-term medication(s): bupropion, carvedilol, escitalopram, gabapentin, hydrochlorothiazide, insulin lispro, lantus solostar, loratadine, lubiprostone, metformin, mometasone, morphine, morphine, [START ON 06/04/2023] morphine, naloxone, olmesartan, pantoprazole, rosuvastatin, and benefiber.  Pharmacotherapy (Medications Ordered): No orders of the defined types were placed in this encounter.  Orders:  No orders of the defined types were placed in this encounter.  Follow-up plan:   No follow-ups on file.      Interventional Therapies  Risk Factors  Considerations:   Allergy: CONTRAST, IODINE, & shellfish  VASOVAGAL RXN BNZ Tx   MO (L-FCT 5" needle to the hub)      NO MORE RFA - intraoperative noncompliance, unnecessarily increasing radiation exposure NOTE: NO MORE RFA procedures. Difficulty following intra-procedural instructions and commands, resulting in unnecessarily long exposure of staff to radiation.    Planned  Pending:   Therapeutic bilateral lumbar facet MBB #5    Under consideration:   Diagnostic Left GONB    Completed:   Therapeutic bilateral lumbar facet (L2-S1) MBB x4 (04/14/2022) (100/100/80/80)  Therapeutic left lumbar facet (L2-S1 MB) RFA x2 (05/09/2019) (90/90/77/>75)  Therapeutic right lumbar facet (L2-S1 MB) RFA x6 (12/14/2019)  (100/100/70/70) (No intra-op compliance)  Diagnostic/therapeutic right small joint inj. (thumb) x1 (03/15/2018) (N/A)  Diagnostic/therapeutic left cervical ESI x1 (12/18/2015) (90/70/50/50)    Completed by other providers:   None at this time   Therapeutic  Palliative (PRN) options:   Palliative lumbar facet MBB  Therapeutic cervical ESI    Pharmacotherapy  Nonopioids transferred 12/14/2019: Gabapentin, Amitiza, Benefiber, Mobic       Recent Visits Date Type Provider Dept  04/20/23 Procedure visit Delano Metz, MD [facility]  03/31/23 Office Visit Delano Metz, MD [facility]  Showing recent visits within past  90 days and meeting all other requirements Future Appointments Date Type Provider Dept  05/06/23 Appointment Delano Metz, MD [facility]  06/30/23 Appointment Delano Metz, MD [facility]  Showing future appointments within next 90 days and meeting all other requirements  I discussed the assessment and treatment plan with the patient. The patient was provided an opportunity to ask questions and all were answered. The patient agreed with the plan and demonstrated an understanding of the instructions.  Patient advised to call back or seek an in-person evaluation if the symptoms or condition worsens.  Duration of encounter: *** minutes.  Total time on encounter, as per AMA guidelines included both the face-to-face and non-face-to-face time personally spent by the physician and/or other qualified health care professional(s) on the day of the encounter (includes time in activities that require the physician or other qualified health care professional and does not include time in activities normally performed by clinical staff). Physician's time may include the following activities when performed: Preparing to see the patient (e.g., pre-charting review of records, searching for previously ordered imaging, lab work,  and nerve conduction tests) Review of prior analgesic pharmacotherapies. Reviewing PMP Interpreting ordered tests (e.g., lab work, imaging, nerve conduction tests) Performing post-procedure evaluations, including interpretation of diagnostic procedures Obtaining and/or reviewing separately obtained history Performing a medically appropriate examination and/or evaluation Counseling and educating the patient/family/caregiver Ordering medications, tests, or procedures Referring and communicating with other health care professionals (when not separately reported) Documenting clinical information in the electronic or other health record Independently interpreting results (not separately reported) and communicating results to the patient/ family/caregiver Care coordination (not separately reported)  Note by: Oswaldo Done, MD Date: 05/06/2023; Time: 7:14 PM

## 2023-05-06 ENCOUNTER — Ambulatory Visit: Attending: Pain Medicine | Admitting: Pain Medicine

## 2023-05-06 ENCOUNTER — Encounter: Payer: Self-pay | Admitting: Pain Medicine

## 2023-05-06 VITALS — BP 101/67 | HR 80 | Temp 97.9°F | Resp 18 | Ht 73.0 in | Wt 242.0 lb

## 2023-05-06 DIAGNOSIS — M47816 Spondylosis without myelopathy or radiculopathy, lumbar region: Secondary | ICD-10-CM | POA: Insufficient documentation

## 2023-05-06 DIAGNOSIS — M545 Low back pain, unspecified: Secondary | ICD-10-CM | POA: Diagnosis not present

## 2023-05-06 DIAGNOSIS — M5459 Other low back pain: Secondary | ICD-10-CM | POA: Insufficient documentation

## 2023-05-06 DIAGNOSIS — Z09 Encounter for follow-up examination after completed treatment for conditions other than malignant neoplasm: Secondary | ICD-10-CM | POA: Insufficient documentation

## 2023-05-06 DIAGNOSIS — G8929 Other chronic pain: Secondary | ICD-10-CM | POA: Diagnosis not present

## 2023-05-06 NOTE — Patient Instructions (Signed)

## 2023-05-06 NOTE — Progress Notes (Signed)
 Nursing Pain Medication Assessment:  Safety precautions to be maintained throughout the outpatient stay will include: orient to surroundings, keep bed in low position, maintain call bell within reach at all times, provide assistance with transfer out of bed and ambulation.  Medication Inspection Compliance: Pill count conducted under aseptic conditions, in front of the patient. Neither the pills nor the bottle was removed from the patient's sight at any time. Once count was completed pills were immediately returned to the patient in their original bottle.  Medication: Morphine IR Pill/Patch Count:  75 of 90 pills remain Pill/Patch Appearance: Markings consistent with prescribed medication Bottle Appearance: Standard pharmacy container. Clearly labeled. Filled Date: 03 / 24 / 2025 Last Medication intake:  Today

## 2023-05-10 ENCOUNTER — Ambulatory Visit (INDEPENDENT_AMBULATORY_CARE_PROVIDER_SITE_OTHER): Admitting: Podiatry

## 2023-05-10 DIAGNOSIS — Z91198 Patient's noncompliance with other medical treatment and regimen for other reason: Secondary | ICD-10-CM

## 2023-05-10 NOTE — Progress Notes (Signed)
 1. Failure to attend appointment with reason given    Patient having car trouble enroute to appointment.

## 2023-05-13 ENCOUNTER — Ambulatory Visit: Admitting: Podiatry

## 2023-05-19 ENCOUNTER — Ambulatory Visit: Attending: Cardiology

## 2023-05-19 DIAGNOSIS — E7800 Pure hypercholesterolemia, unspecified: Secondary | ICD-10-CM

## 2023-05-19 DIAGNOSIS — R600 Localized edema: Secondary | ICD-10-CM | POA: Diagnosis not present

## 2023-05-19 DIAGNOSIS — I10 Essential (primary) hypertension: Secondary | ICD-10-CM

## 2023-05-19 DIAGNOSIS — R079 Chest pain, unspecified: Secondary | ICD-10-CM | POA: Diagnosis not present

## 2023-05-19 LAB — ECHOCARDIOGRAM COMPLETE
AR max vel: 2.27 cm2
AV Area VTI: 2.61 cm2
AV Area mean vel: 2.26 cm2
AV Mean grad: 2 mmHg
AV Peak grad: 3.8 mmHg
Ao pk vel: 0.98 m/s
Area-P 1/2: 3.31 cm2
S' Lateral: 2.16 cm

## 2023-05-26 ENCOUNTER — Other Ambulatory Visit: Payer: Self-pay | Admitting: Psychiatry

## 2023-05-26 DIAGNOSIS — F411 Generalized anxiety disorder: Secondary | ICD-10-CM

## 2023-05-31 ENCOUNTER — Ambulatory Visit (INDEPENDENT_AMBULATORY_CARE_PROVIDER_SITE_OTHER): Admitting: Podiatry

## 2023-05-31 DIAGNOSIS — Z91198 Patient's noncompliance with other medical treatment and regimen for other reason: Secondary | ICD-10-CM

## 2023-06-05 NOTE — Progress Notes (Signed)
 1. Failure to attend appointment with reason given   Appointment scheduled by patient.

## 2023-06-07 ENCOUNTER — Ambulatory Visit (INDEPENDENT_AMBULATORY_CARE_PROVIDER_SITE_OTHER): Admitting: Podiatry

## 2023-06-07 ENCOUNTER — Encounter: Payer: Self-pay | Admitting: Podiatry

## 2023-06-07 DIAGNOSIS — M79675 Pain in left toe(s): Secondary | ICD-10-CM | POA: Diagnosis not present

## 2023-06-07 DIAGNOSIS — B351 Tinea unguium: Secondary | ICD-10-CM | POA: Diagnosis not present

## 2023-06-07 DIAGNOSIS — M79674 Pain in right toe(s): Secondary | ICD-10-CM | POA: Diagnosis not present

## 2023-06-11 ENCOUNTER — Encounter: Payer: Self-pay | Admitting: Podiatry

## 2023-06-11 NOTE — Progress Notes (Signed)
  Subjective:  Patient ID: Alan Schmidt, male    DOB: 12-26-1945,  MRN: [id number]  Alan Schmidt presents to clinic today for at risk foot care with history of diabetic neuropathy and painful elongated mycotic toenails 1-5 bilaterally which are tender when wearing enclosed shoe gear. Pain is relieved with periodic professional debridement.  Chief Complaint  Patient presents with   Diabetes    "Routine foot care, I need my toenails cut back and I may have a callus trying to grow."  Dr. Donley Furth - 04/09/2023; A1c - ?; Fasting Glucose - 133 mg/dl   New problem(s): None.   PCP is Shari Daughters, MD.  Allergies  Allergen Reactions   Contrast Media [Iodinated Contrast Media] Swelling   Iodine Swelling   Shellfish Allergy Nausea And Vomiting and Swelling    Review of Systems: Negative except as noted in the HPI.  Objective: No changes noted in today's physical examination. There were no vitals filed for this visit. Alan Schmidt is a pleasant 78 y.o. male in NAD. AAO x 3.  Vascular Examination: Capillary refill time immediate b/l. Vascular status intact b/l with palpable pedal pulses. Pedal hair present b/l. No pain with calf compression b/l. Skin temperature gradient WNL b/l. No cyanosis or clubbing b/l. No ischemia or gangrene noted b/l.   Neurological Examination: Protective sensation diminished with 10g monofilament b/l.  Dermatological Examination: Pedal skin with normal turgor, texture and tone b/l.  No open wounds. No interdigital macerations.   Toenails 1-5 b/l thick, discolored, elongated with subungual debris and pain on dorsal palpation.   Cracked nail plate left great toe. No erythema, no edema, no drainage, no fluctuance.  Musculoskeletal Examination: Normal muscle strength 5/5 to all lower extremity muscle groups bilaterally. No pain, crepitus or joint limitation noted with ROM b/l LE. No gross bony pedal deformities b/l. Patient ambulates independently  without assistive aids.  Radiographs: None  Last A1c:      11/09/2022   12:00 AM  Hemoglobin A1C  Hemoglobin-A1c 8.8         This result is from an external source.   Assessment/Plan: 1. Pain due to onychomycosis of toenails of both feet   Patient was evaluated and treated. All patient's and/or POA's questions/concerns addressed on today's visit. Toenails 1-5 debrided in length and girth without incident. Continue foot and shoe inspections daily. Monitor blood glucose per PCP/Endocrinologist's recommendations. Continue soft, supportive shoe gear daily. Report any pedal injuries to medical professional. Call office if there are any questions/concerns. Return in about 3 months (around 09/07/2023).  Alan Schmidt, DPM      [place] LOCATION: [address] [place] [address]                   Office [phone]   [facility] LOCATION: [address] [place], [place] [address] Office [phone]

## 2023-06-15 ENCOUNTER — Telehealth: Payer: Self-pay

## 2023-06-15 NOTE — Telephone Encounter (Signed)
 Copied from CRM [id number]. Topic: General - Other >> Jun 15, 2023 10:01 AM Adonis Hoot wrote: Reason for CRM: [facility] called to verify if office has received refill request for Continuous Glucose Sensor (FREESTYLE LIBRE 14 DAY SENSOR) MISC that was faxed over on 06/14/2023  Fx [phone]

## 2023-06-16 ENCOUNTER — Other Ambulatory Visit: Payer: Self-pay

## 2023-06-16 DIAGNOSIS — Z794 Long term (current) use of insulin: Secondary | ICD-10-CM

## 2023-06-16 MED ORDER — FREESTYLE LIBRE 14 DAY SENSOR MISC: 1.0000 | 3 refills | Status: AC

## 2023-06-16 NOTE — Telephone Encounter (Signed)
Refill request has been sent to pharmacy.  

## 2023-06-17 ENCOUNTER — Telehealth: Payer: Self-pay

## 2023-06-17 NOTE — Telephone Encounter (Signed)
 Telephone call from patient stating that he had a fall a couple of days ago. He reports that he feels something "loose" near his prosthesis. Patient advised to see PCP, Ortho, or report to ED.  Patient verbalized understanding.

## 2023-06-18 ENCOUNTER — Other Ambulatory Visit: Payer: Self-pay | Admitting: Psychiatry

## 2023-06-18 DIAGNOSIS — F411 Generalized anxiety disorder: Secondary | ICD-10-CM

## 2023-06-21 ENCOUNTER — Other Ambulatory Visit: Payer: Self-pay | Admitting: Physician Assistant

## 2023-06-21 DIAGNOSIS — M25551 Pain in right hip: Secondary | ICD-10-CM

## 2023-06-22 ENCOUNTER — Encounter (INDEPENDENT_AMBULATORY_CARE_PROVIDER_SITE_OTHER): Payer: Self-pay

## 2023-06-22 ENCOUNTER — Other Ambulatory Visit: Payer: Self-pay | Admitting: Psychiatry

## 2023-06-22 DIAGNOSIS — F411 Generalized anxiety disorder: Secondary | ICD-10-CM

## 2023-06-23 ENCOUNTER — Other Ambulatory Visit: Payer: Self-pay | Admitting: Internal Medicine

## 2023-06-23 DIAGNOSIS — F32A Depression, unspecified: Secondary | ICD-10-CM

## 2023-06-23 DIAGNOSIS — F419 Anxiety disorder, unspecified: Secondary | ICD-10-CM

## 2023-06-29 ENCOUNTER — Ambulatory Visit

## 2023-06-29 ENCOUNTER — Ambulatory Visit: Admission: RE | Admit: 2023-06-29 | Source: Ambulatory Visit

## 2023-06-30 ENCOUNTER — Encounter: Payer: 59 | Admitting: Pain Medicine

## 2023-06-30 ENCOUNTER — Ambulatory Visit (HOSPITAL_BASED_OUTPATIENT_CLINIC_OR_DEPARTMENT_OTHER): Admitting: Nurse Practitioner

## 2023-06-30 DIAGNOSIS — G8929 Other chronic pain: Secondary | ICD-10-CM

## 2023-06-30 DIAGNOSIS — Z79899 Other long term (current) drug therapy: Secondary | ICD-10-CM

## 2023-06-30 DIAGNOSIS — M47816 Spondylosis without myelopathy or radiculopathy, lumbar region: Secondary | ICD-10-CM

## 2023-06-30 DIAGNOSIS — Z91199 Patient's noncompliance with other medical treatment and regimen due to unspecified reason: Secondary | ICD-10-CM

## 2023-06-30 DIAGNOSIS — M51362 Other intervertebral disc degeneration, lumbar region with discogenic back pain and lower extremity pain: Secondary | ICD-10-CM

## 2023-06-30 DIAGNOSIS — G894 Chronic pain syndrome: Secondary | ICD-10-CM

## 2023-06-30 DIAGNOSIS — Z79891 Long term (current) use of opiate analgesic: Secondary | ICD-10-CM

## 2023-06-30 DIAGNOSIS — M5030 Other cervical disc degeneration, unspecified cervical region: Secondary | ICD-10-CM

## 2023-06-30 NOTE — Progress Notes (Signed)
 06/30/2023- No show

## 2023-07-05 ENCOUNTER — Other Ambulatory Visit: Payer: Self-pay

## 2023-07-05 DIAGNOSIS — Z794 Long term (current) use of insulin: Secondary | ICD-10-CM

## 2023-07-05 MED ORDER — ACCU-CHEK GUIDE TEST VI STRP: ORAL_STRIP | 12 refills | Status: AC

## 2023-07-07 DIAGNOSIS — Z794 Long term (current) use of insulin: Secondary | ICD-10-CM | POA: Diagnosis not present

## 2023-07-07 DIAGNOSIS — E118 Type 2 diabetes mellitus with unspecified complications: Secondary | ICD-10-CM | POA: Diagnosis not present

## 2023-07-14 ENCOUNTER — Telehealth: Payer: Self-pay

## 2023-07-14 NOTE — Telephone Encounter (Signed)
 Received refill request for Humalog . After review pt's chart. Pt has a new provider at [facility]. Refill not sent

## 2023-07-20 ENCOUNTER — Other Ambulatory Visit: Payer: Self-pay | Admitting: Internal Medicine

## 2023-07-20 DIAGNOSIS — F32A Depression, unspecified: Secondary | ICD-10-CM

## 2023-07-20 DIAGNOSIS — I10 Essential (primary) hypertension: Secondary | ICD-10-CM

## 2023-07-20 DIAGNOSIS — K219 Gastro-esophageal reflux disease without esophagitis: Secondary | ICD-10-CM

## 2023-07-20 DIAGNOSIS — Z794 Long term (current) use of insulin: Secondary | ICD-10-CM

## 2023-07-21 ENCOUNTER — Other Ambulatory Visit

## 2023-07-21 DIAGNOSIS — E119 Type 2 diabetes mellitus without complications: Secondary | ICD-10-CM | POA: Diagnosis not present

## 2023-07-21 DIAGNOSIS — Z794 Long term (current) use of insulin: Secondary | ICD-10-CM | POA: Diagnosis not present

## 2023-07-21 DIAGNOSIS — I10 Essential (primary) hypertension: Secondary | ICD-10-CM | POA: Diagnosis not present

## 2023-07-21 DIAGNOSIS — E782 Mixed hyperlipidemia: Secondary | ICD-10-CM | POA: Diagnosis not present

## 2023-07-22 ENCOUNTER — Encounter: Admitting: Nurse Practitioner

## 2023-07-22 LAB — COMPREHENSIVE METABOLIC PANEL WITH GFR
ALT: 12 IU/L (ref 0–44)
AST: 19 IU/L (ref 0–40)
Albumin: 4 g/dL (ref 3.8–4.8)
Alkaline Phosphatase: 95 IU/L (ref 44–121)
BUN/Creatinine Ratio: 13 (ref 10–24)
BUN: 11 mg/dL (ref 8–27)
Bilirubin Total: 0.4 mg/dL (ref 0.0–1.2)
CO2: 24 mmol/L (ref 20–29)
Calcium: 9.6 mg/dL (ref 8.6–10.2)
Chloride: 99 mmol/L (ref 96–106)
Creatinine, Ser: 0.86 mg/dL (ref 0.76–1.27)
Globulin, Total: 2.8 g/dL (ref 1.5–4.5)
Glucose: 124 mg/dL — ABNORMAL HIGH (ref 70–99)
Potassium: 4 mmol/L (ref 3.5–5.2)
Sodium: 141 mmol/L (ref 134–144)
Total Protein: 6.8 g/dL (ref 6.0–8.5)
eGFR: 89 mL/min/{1.73_m2} (ref >59)

## 2023-07-22 LAB — LIPID PANEL
Chol/HDL Ratio: 2.1 ratio (ref 0.0–5.0)
Cholesterol, Total: 108 mg/dL (ref 100–199)
HDL: 51 mg/dL (ref >39)
LDL Chol Calc (NIH): 41 mg/dL (ref 0–99)
Triglycerides: 78 mg/dL (ref 0–149)
VLDL Cholesterol Cal: 16 mg/dL (ref 5–40)

## 2023-07-22 LAB — HEMOGLOBIN A1C
Est. average glucose Bld gHb Est-mCnc: 105 mg/dL
Hgb A1c MFr Bld: 5.3 % (ref 4.8–5.6)

## 2023-07-23 ENCOUNTER — Ambulatory Visit: Admitting: Internal Medicine

## 2023-07-23 ENCOUNTER — Encounter: Payer: Self-pay | Admitting: Internal Medicine

## 2023-07-23 ENCOUNTER — Ambulatory Visit: Payer: Self-pay | Admitting: Internal Medicine

## 2023-07-23 VITALS — BP 120/80 | HR 76 | Temp 97.3°F | Ht 73.0 in | Wt 245.8 lb

## 2023-07-23 DIAGNOSIS — E1142 Type 2 diabetes mellitus with diabetic polyneuropathy: Secondary | ICD-10-CM

## 2023-07-23 DIAGNOSIS — I10 Essential (primary) hypertension: Secondary | ICD-10-CM | POA: Diagnosis not present

## 2023-07-23 DIAGNOSIS — E119 Type 2 diabetes mellitus without complications: Secondary | ICD-10-CM

## 2023-07-23 DIAGNOSIS — Z794 Long term (current) use of insulin: Secondary | ICD-10-CM

## 2023-07-23 LAB — POC CREATINE & ALBUMIN,URINE
Albumin/Creatinine Ratio, Urine, POC: <30
Creatinine, POC: 50 mg/dL
Microalbumin Ur, POC: 10 mg/L

## 2023-07-23 LAB — GLUCOSE, POCT (MANUAL RESULT ENTRY): POC Glucose: 76 mg/dL (ref 70–99)

## 2023-07-23 MED ORDER — METFORMIN HCL 500 MG PO TABS: 500.0000 mg | ORAL_TABLET | Freq: Two times a day (BID) | ORAL | 0 refills | Status: DC

## 2023-07-23 NOTE — Progress Notes (Unsigned)
 Established Patient Office Visit  Subjective:  Patient ID: Alan Schmidt, male    DOB: 1945/10/27  Age: 78 y.o. MRN: [id number]  Chief Complaint  Patient presents with  . Follow-up    3 month follow up    No new complaints, here for lab review and medication refills. Labs reviewed and notable for well controlled diabetes, A1c now at target, lipids at target with unremarkable cmp. Denies any hypoglycemic episodes and home bg readings have been at target.     No other concerns at this time.   Past Medical History:  Diagnosis Date  . Acute postoperative pain 11/24/2016  . Anxiety   . Chronic hip pain (Right) 12/05/2014  . Chronic lumbar pain   . Depression   . Diabetes mellitus without complication (HCC)   . Hyperlipidemia   . Hypertension   . Kidney stones   . Migraines     Past Surgical History:  Procedure Laterality Date  . right hip surgery     4 surgeries  . TONSILLECTOMY    . TOTAL HIP ARTHROPLASTY      Social History   Socioeconomic History  . Marital status: Divorced    Spouse name: 1 Bio; 6 Adopted  . Number of children: 7  . Years of education: doctorate  . Highest education level: Doctorate  Occupational History  . Occupation: Retired  Tobacco Use  . Smoking status: Former    Types: Cigarettes  . Smokeless tobacco: Never  Vaping Use  . Vaping status: Never Used  Substance and Sexual Activity  . Alcohol use: No    Alcohol/week: 0.0 standard drinks of alcohol  . Drug use: No  . Sexual activity: Not Currently  Other Topics Concern  . Not on file  Social History Narrative   Per patient he has 1 biological child and 6 adopted children   Social Drivers of Corporate investment banker Strain: High Risk (03/31/2023)   Received from [facility]   Overall Financial Resource Strain (CARDIA)   . Difficulty of Paying Living Expenses: Very hard  Food Insecurity: Food Insecurity Present (03/31/2023)   Received from [facility]   Hunger Vital Sign   . Within the past 12 months, you worried that your food would run out before you got the money to buy more.: Sometimes true   . Within the past 12 months, the food you bought just didn't last and you didn't have money to get more.: Sometimes true  Transportation Needs: Unmet Transportation Needs (03/31/2023)   Received from [facility]   [facility] - Transportation   . In the past 12 months, has lack of transportation kept you from medical appointments or from getting medications?: Yes   . Lack of Transportation (Non-Medical): Yes  Physical Activity: Inactive (02/16/2023)   Exercise Vital Sign   . Days of Exercise per Week: 0 days   . Minutes of Exercise per Session: 0 min  Stress: No Stress Concern Present (02/16/2023)   [employer] of Occupational [facility] - Occupational Stress Questionnaire   . Feeling of Stress : Only a little  Social Connections: Socially Isolated (02/16/2023)   Social Connection and Isolation Panel   . Frequency of Communication with Friends and Family: More than three times a week   . Frequency of Social Gatherings with Friends and Family: Never   . Attends Religious Services: Never   . Active Member of Clubs or Organizations: No   . Attends  Club or Organization Meetings: Never   . Marital Status: Divorced  Catering manager Violence: Not At Risk (02/16/2023)   Humiliation, Afraid, Rape, and Kick questionnaire   . Fear of Current or Ex-Partner: No   . Emotionally Abused: No   . Physically Abused: No   . Sexually Abused: No    Family History  Problem Relation Age of Onset  . Cancer Mother   . Heart disease Father   . Stroke Father   . Diabetes Father   . Diabetes Sister   . Diabetes Sister   . Mental illness Neg Hx     Allergies  Allergen Reactions  . Contrast Media [Iodinated Contrast Media] Swelling  . Iodine Swelling  . Shellfish Allergy Nausea And Vomiting and Swelling    Outpatient  Medications Prior to Visit  Medication Sig  . ACCU-CHEK GUIDE test strip USE AS INSTRUCTED DX CODE: E11.9  . acetaminophen  (TYLENOL ) 500 MG tablet Take 500 mg by mouth every 6 (six) hours as needed.  . ALPRAZolam  (XANAX ) 1 MG tablet TAKE 1/2 TABLET BY MOUTH 2 TIMES DAILY AS NEEDED FOR ANXIETY.  Aaron Aas aspirin EC 81 MG tablet Take 81 mg by mouth daily.  . buPROPion  (WELLBUTRIN  XL) 150 MG 24 hr tablet TAKE 1 TABLET (150 MG TOTAL) BY MOUTH DAILY FOR 7 DAYS, THEN 2 TABLETS (300 MG TOTAL) DAILY.  . carvedilol  (COREG ) 25 MG tablet Take 1 tablet by mouth twice daily.  . Continuous Blood Gluc Receiver (FREESTYLE LIBRE 14 DAY READER) DEVI 1 Device by Does not apply route daily. Use to scan to check blood sugar up to 7 times daily; E11.42,  . Continuous Glucose Sensor (FREESTYLE LIBRE 14 DAY SENSOR) MISC 1 Device by Does not apply route every 14 (fourteen) days. E11.42  . desoximetasone  (TOPICORT ) 0.25 % cream APPLY CREAM TO AFFECTED AREA TWO TIMES DAILY, FOR UP TO 7 DAYS, DO NOT APPLY TO FACE   Strength: 0.25 %  . EPINEPHrine  (EPIPEN  2-PAK) 0.3 mg/0.3 mL IJ SOAJ injection Inject 0.3 mg into the muscle as needed for anaphylaxis.  . escitalopram  (LEXAPRO ) 20 MG tablet Take 1 tablet by mouth daily.  . gabapentin  (NEURONTIN ) 250 MG/5ML solution Take 6 ml by mouth at bedtime.  Aaron Aas glucose blood (ACCU-CHEK GUIDE TEST) test strip Use as instructed  . hydrochlorothiazide  (HYDRODIURIL ) 25 MG tablet Take 1 tablet by mouth daily.  . insulin  lispro (HUMALOG  KWIKPEN) 100 UNIT/ML KwikPen INJECT 5 TO 7 UNITS UNDER THE SKIN THREE TIMES DAILY WITH MEALS  . Insulin  Pen Needle (BD PEN NEEDLE NANO U/F) 32G X 4 MM MISC USE EVERY DAY  . JARDIANCE  25 MG TABS tablet Take 1 tablet by mouth daily.  Aaron Aas LANTUS SOLOSTAR 100 UNIT/ML Solostar Pen Inject 8 Units into the skin daily.  . loratadine  (CLARITIN ) 10 MG tablet Take 1 tablet by mouth daily.  . lubiprostone  (AMITIZA ) 8 MCG capsule TAKE 1 CAPSULE (8 MCG TOTAL) BY MOUTH 2 (TWO) TIMES  DAILY WITH A MEAL. SWALLOW THE MEDICATION WHOLE. DO NOT BREAK OR CHEW THE MEDICATION.  . methocarbamol (ROBAXIN) 500 MG tablet Take 500 mg by mouth 2 (two) times daily.  . mometasone  (NASONEX ) 50 MCG/ACT nasal spray Instill 2 sprays into each nostril once daily.  . morphine  (MS CONTIN ) 15 MG 12 hr tablet Take 1 tablet (15 mg total) by mouth every 8 (eight) hours. Must last 30 days. Do not break tablet  . naloxone  (NARCAN ) nasal spray 4 mg/0.1 mL Place 1 spray into the nose as  needed for up to 365 doses (for opioid-induced respiratory depresssion). In case of emergency (overdose), spray once into each nostril. If no response within 3 minutes, repeat application and call 911.  . olmesartan  (BENICAR ) 40 MG tablet Take 1 tablet by mouth daily.  . pantoprazole  (PROTONIX ) 40 MG tablet Take 1 tablet by mouth daily.  . rosuvastatin  (CRESTOR ) 40 MG tablet Take 1 tablet by mouth daily.  . sennosides-docusate sodium (SENOKOT-Lloyde Ludlam) 8.6-50 MG tablet Take 1 tablet by mouth daily as needed.   . tamsulosin  (FLOMAX ) 0.4 MG CAPS capsule Take 2 capsules (0.8 mg total) by mouth daily after breakfast.  . Wheat Dextrin (BENEFIBER) POWD Take 6 g by mouth 3 (three) times daily before meals. (2 tsp = 6 g)  . [DISCONTINUED] metFORMIN  (GLUCOPHAGE ) 1000 MG tablet Take 1 tablet by mouth twice daily with meals.   No facility-administered medications prior to visit.    Review of Systems  Constitutional:  Negative for malaise/fatigue and weight loss (gained 3 lbs).  HENT:  Positive for congestion.   Eyes:  Negative for blurred vision.       Floaters in left eye  Respiratory: Negative.    Cardiovascular: Negative.   Gastrointestinal:  Positive for constipation and heartburn.  Genitourinary:  Positive for frequency (+ nocturia).  Musculoskeletal:  Positive for back pain.  Neurological:  Positive for tingling.  Endo/Heme/Allergies:  Positive for environmental allergies.  Psychiatric/Behavioral:  Positive for depression. The  patient is nervous/anxious.        Objective:   BP 120/80   Pulse 76   Temp (!) 97.3 F (36.3 C)   Ht 6' 1 (1.854 m)   Wt 245 lb 12.8 oz (111.5 kg)   SpO2 96%   BMI 32.43 kg/m   Vitals:   07/23/23 0945  BP: 120/80  Pulse: 76  Temp: (!) 97.3 F (36.3 C)  Height: 6' 1 (1.854 m)  Weight: 245 lb 12.8 oz (111.5 kg)  SpO2: 96%  BMI (Calculated): 32.44    Physical Exam Vitals reviewed.  Constitutional:      Appearance: Normal appearance. He is overweight.  HENT:     Head: Normocephalic.     Left Ear: There is no impacted cerumen.     Nose: Nose normal.     Mouth/Throat:     Mouth: Mucous membranes are moist.     Pharynx: No posterior oropharyngeal erythema.   Eyes:     Extraocular Movements: Extraocular movements intact.     Pupils: Pupils are equal, round, and reactive to light.    Cardiovascular:     Rate and Rhythm: Regular rhythm.     Chest Wall: PMI is not displaced.     Pulses: Normal pulses.     Heart sounds: Normal heart sounds. No murmur heard. Pulmonary:     Effort: Pulmonary effort is normal.     Breath sounds: Normal air entry. No rhonchi or rales.  Abdominal:     General: Abdomen is flat. Bowel sounds are normal. There is no distension.     Palpations: Abdomen is soft. There is no hepatomegaly, splenomegaly or mass.     Tenderness: There is no abdominal tenderness.   Musculoskeletal:        General: Normal range of motion.     Cervical back: Normal range of motion and neck supple.     Right lower leg: No edema.     Left lower leg: No edema.   Skin:    General: Skin is warm and  dry.   Neurological:     General: No focal deficit present.     Mental Status: He is alert and oriented to person, place, and time.     Cranial Nerves: No cranial nerve deficit.     Motor: No weakness.   Psychiatric:        Mood and Affect: Mood normal.        Behavior: Behavior normal.     Results for orders placed or performed in visit on 07/23/23  POCT  Glucose (CBG)  Result Value Ref Range   POC Glucose 76 70 - 99 mg/dl    Recent Results (from the past 2160 hours)  ECHOCARDIOGRAM COMPLETE     Status: None   Collection Time: 05/19/23  2:43 PM  Result Value Ref Range   AR max vel 2.27 cm2   AV Peak grad 3.8 mmHg   Ao pk vel 0.98 m/Sammie Schermerhorn   Gussie Towson' Lateral 2.16 cm   Area-P 1/2 3.31 cm2   AV Area VTI 2.61 cm2   AV Mean grad 2.0 mmHg   AV Area mean vel 2.26 cm2   Est EF 60 - 65%   Comprehensive metabolic panel     Status: Abnormal   Collection Time: 07/21/23 10:38 AM  Result Value Ref Range   Glucose 124 (H) 70 - 99 mg/dL   BUN 11 8 - 27 mg/dL   Creatinine, Ser 9.60 0.76 - 1.27 mg/dL   eGFR 89 >45 WU/JWJ/1.91   BUN/Creatinine Ratio 13 10 - 24   Sodium 141 134 - 144 mmol/L   Potassium 4.0 3.5 - 5.2 mmol/L   Chloride 99 96 - 106 mmol/L   CO2 24 20 - 29 mmol/L   Calcium  9.6 8.6 - 10.2 mg/dL   Total Protein 6.8 6.0 - 8.5 g/dL   Albumin 4.0 3.8 - 4.8 g/dL   Globulin, Total 2.8 1.5 - 4.5 g/dL   Bilirubin Total 0.4 0.0 - 1.2 mg/dL   Alkaline Phosphatase 95 44 - 121 IU/L   AST 19 0 - 40 IU/L   ALT 12 0 - 44 IU/L  Lipid panel     Status: None   Collection Time: 07/21/23 10:38 AM  Result Value Ref Range   Cholesterol, Total 108 100 - 199 mg/dL   Triglycerides 78 0 - 149 mg/dL   HDL 51 >47 mg/dL   VLDL Cholesterol Cal 16 5 - 40 mg/dL   LDL Chol Calc (NIH) 41 0 - 99 mg/dL   Chol/HDL Ratio 2.1 0.0 - 5.0 ratio    Comment:                                   T. Chol/HDL Ratio                                             Men  Women                               1/2 Avg.Risk  3.4    3.3                                   Avg.Risk  5.0  4.4                                2X Avg.Risk  9.6    7.1                                3X Avg.Risk 23.4   11.0   Hemoglobin A1c     Status: None   Collection Time: 07/21/23 10:38 AM  Result Value Ref Range   Hgb A1c MFr Bld 5.3 4.8 - 5.6 %    Comment:          Prediabetes: 5.7 - 6.4          Diabetes:  >6.4          Glycemic control for adults with diabetes: <7.0    Est. average glucose Bld gHb Est-mCnc 105 mg/dL  POCT Glucose (CBG)     Status: Normal   Collection Time: 07/23/23 10:05 AM  Result Value Ref Range   POC Glucose 76 70 - 99 mg/dl      Assessment & Plan:  As per problem list. Decrease Metformin . Problem List Items Addressed This Visit       Endocrine   Diabetic peripheral neuropathy (HCC) (Chronic)   Relevant Medications   metFORMIN  (GLUCOPHAGE ) 500 MG tablet   Type 2 diabetes mellitus treated with insulin  (HCC) - Primary   Relevant Medications   metFORMIN  (GLUCOPHAGE ) 500 MG tablet   Other Relevant Orders   POCT Glucose (CBG) (Completed)   Other Visit Diagnoses       Essential hypertension           Return in about 3 months (around 10/23/2023) for fu with labs prior.   Total time spent: 15 minutes  Arzella Bitters, MD  07/23/2023   This document may have been prepared by [facility] Voice Recognition software and as such may include unintentional dictation errors.

## 2023-07-28 ENCOUNTER — Ambulatory Visit: Attending: Cardiology | Admitting: Cardiology

## 2023-07-31 ENCOUNTER — Other Ambulatory Visit: Payer: Self-pay | Admitting: Internal Medicine

## 2023-07-31 DIAGNOSIS — F32A Depression, unspecified: Secondary | ICD-10-CM

## 2023-08-05 ENCOUNTER — Other Ambulatory Visit: Payer: Self-pay

## 2023-08-05 DIAGNOSIS — E785 Hyperlipidemia, unspecified: Secondary | ICD-10-CM

## 2023-08-05 DIAGNOSIS — I10 Essential (primary) hypertension: Secondary | ICD-10-CM

## 2023-08-05 MED ORDER — ROSUVASTATIN CALCIUM 40 MG PO TABS: 40.0000 mg | ORAL_TABLET | Freq: Every day | ORAL | 3 refills | Status: AC

## 2023-08-05 MED ORDER — MOMETASONE FUROATE 50 MCG/ACT NA SUSP: NASAL | 1 refills | Status: DC

## 2023-08-05 MED ORDER — CARVEDILOL 25 MG PO TABS: 25.0000 mg | ORAL_TABLET | Freq: Two times a day (BID) | ORAL | 1 refills | Status: DC

## 2023-08-06 DIAGNOSIS — E118 Type 2 diabetes mellitus with unspecified complications: Secondary | ICD-10-CM | POA: Diagnosis not present

## 2023-08-06 DIAGNOSIS — Z794 Long term (current) use of insulin: Secondary | ICD-10-CM | POA: Diagnosis not present

## 2023-08-10 ENCOUNTER — Ambulatory Visit: Admission: RE | Admit: 2023-08-10 | Source: Ambulatory Visit

## 2023-08-10 ENCOUNTER — Ambulatory Visit

## 2023-08-18 ENCOUNTER — Other Ambulatory Visit: Payer: Self-pay

## 2023-08-18 DIAGNOSIS — J3089 Other allergic rhinitis: Secondary | ICD-10-CM

## 2023-08-18 DIAGNOSIS — G894 Chronic pain syndrome: Secondary | ICD-10-CM

## 2023-08-18 DIAGNOSIS — Z79899 Other long term (current) drug therapy: Secondary | ICD-10-CM

## 2023-08-18 DIAGNOSIS — Z79891 Long term (current) use of opiate analgesic: Secondary | ICD-10-CM

## 2023-08-18 MED ORDER — LORATADINE 10 MG PO TABS: 10.0000 mg | ORAL_TABLET | Freq: Every day | ORAL | 0 refills | Status: AC

## 2023-08-18 MED ORDER — NALOXONE HCL 4 MG/0.1ML NA LIQD: 1.0000 | NASAL | 2 refills | Status: AC | PRN

## 2023-09-05 DIAGNOSIS — E118 Type 2 diabetes mellitus with unspecified complications: Secondary | ICD-10-CM | POA: Diagnosis not present

## 2023-09-05 DIAGNOSIS — Z794 Long term (current) use of insulin: Secondary | ICD-10-CM | POA: Diagnosis not present

## 2023-09-09 ENCOUNTER — Ambulatory Visit (INDEPENDENT_AMBULATORY_CARE_PROVIDER_SITE_OTHER): Admitting: Podiatry

## 2023-09-09 DIAGNOSIS — Z91199 Patient's noncompliance with other medical treatment and regimen due to unspecified reason: Secondary | ICD-10-CM

## 2023-09-09 NOTE — Progress Notes (Signed)
 1. No-show for appointment

## 2023-09-17 ENCOUNTER — Telehealth: Payer: Self-pay

## 2023-09-17 NOTE — Telephone Encounter (Signed)
 [facility] pharmacy called asking for clarification on patients metformin  states that they have him on 1000mg  bid but they received a pharmacy alert for the 500mg  bid

## 2023-09-18 ENCOUNTER — Other Ambulatory Visit: Payer: Self-pay | Admitting: Internal Medicine

## 2023-09-20 ENCOUNTER — Other Ambulatory Visit: Payer: Self-pay

## 2023-09-20 ENCOUNTER — Ambulatory Visit: Attending: Nurse Practitioner | Admitting: Nurse Practitioner

## 2023-09-20 ENCOUNTER — Encounter: Payer: Self-pay | Admitting: Nurse Practitioner

## 2023-09-20 DIAGNOSIS — M542 Cervicalgia: Secondary | ICD-10-CM | POA: Diagnosis not present

## 2023-09-20 DIAGNOSIS — Z96641 Presence of right artificial hip joint: Secondary | ICD-10-CM | POA: Diagnosis not present

## 2023-09-20 DIAGNOSIS — G894 Chronic pain syndrome: Secondary | ICD-10-CM | POA: Diagnosis not present

## 2023-09-20 DIAGNOSIS — M25561 Pain in right knee: Secondary | ICD-10-CM | POA: Insufficient documentation

## 2023-09-20 DIAGNOSIS — M545 Low back pain, unspecified: Secondary | ICD-10-CM | POA: Insufficient documentation

## 2023-09-20 DIAGNOSIS — G8929 Other chronic pain: Secondary | ICD-10-CM | POA: Diagnosis not present

## 2023-09-20 DIAGNOSIS — Z79899 Other long term (current) drug therapy: Secondary | ICD-10-CM | POA: Diagnosis not present

## 2023-09-20 DIAGNOSIS — M51362 Other intervertebral disc degeneration, lumbar region with discogenic back pain and lower extremity pain: Secondary | ICD-10-CM | POA: Insufficient documentation

## 2023-09-20 DIAGNOSIS — M79604 Pain in right leg: Secondary | ICD-10-CM | POA: Diagnosis not present

## 2023-09-20 DIAGNOSIS — M79605 Pain in left leg: Secondary | ICD-10-CM | POA: Insufficient documentation

## 2023-09-20 DIAGNOSIS — M47816 Spondylosis without myelopathy or radiculopathy, lumbar region: Secondary | ICD-10-CM | POA: Diagnosis not present

## 2023-09-20 DIAGNOSIS — M25562 Pain in left knee: Secondary | ICD-10-CM | POA: Insufficient documentation

## 2023-09-20 DIAGNOSIS — Z79891 Long term (current) use of opiate analgesic: Secondary | ICD-10-CM | POA: Diagnosis not present

## 2023-09-20 DIAGNOSIS — E119 Type 2 diabetes mellitus without complications: Secondary | ICD-10-CM

## 2023-09-20 DIAGNOSIS — M25551 Pain in right hip: Secondary | ICD-10-CM | POA: Insufficient documentation

## 2023-09-20 DIAGNOSIS — M5030 Other cervical disc degeneration, unspecified cervical region: Secondary | ICD-10-CM | POA: Insufficient documentation

## 2023-09-20 MED ORDER — MORPHINE SULFATE ER 15 MG PO TBCR: 15.0000 mg | EXTENDED_RELEASE_TABLET | Freq: Three times a day (TID) | ORAL | 0 refills | Status: DC

## 2023-09-20 MED ORDER — MORPHINE SULFATE ER 15 MG PO TBCR
15.0000 mg | EXTENDED_RELEASE_TABLET | Freq: Three times a day (TID) | ORAL | 0 refills | Status: DC
Start: 2023-10-20 — End: 2023-12-11

## 2023-09-20 MED ORDER — METFORMIN HCL 500 MG PO TABS: 500.0000 mg | ORAL_TABLET | Freq: Two times a day (BID) | ORAL | 0 refills | Status: DC

## 2023-09-20 NOTE — Telephone Encounter (Signed)
 Sent rx to [facility] and notated that this is the correct dose not the 1000mg  dose

## 2023-09-20 NOTE — Progress Notes (Signed)
 PROVIDER NOTE: Interpretation of information contained herein should be left to medically-trained personnel. Specific patient instructions are provided elsewhere under Patient Instructions section of medical record. This document was created in part using AI and STT-dictation technology, any transcriptional errors that may result from this process are unintentional.  Patient: Alan Schmidt  Service: E/M   PCP: [name], MD  DOB: Sep 18, 1945  DOS: 09/20/2023  Provider: Emmy MARLA Blanch, NP  MRN: [id number]  Delivery: Face-to-face  Specialty: Interventional Pain Management  Type: Established Patient  Setting: Ambulatory outpatient facility  Specialty designation: 09  Referring Prov.: [name], MD  Location: Outpatient office facility       History of present illness (HPI) [name], a 78 y.o. year old male, is here today because of his No primary diagnosis found.. [name] primary complain today is Back Pain (lower)  Pertinent problems: 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   Pain Assessment: Severity of Chronic pain is reported as a 7 /10. Location: Back Lower/denies. Onset: More than a month ago. Quality: Throbbing. Timing: Constant. Modifying factor(s): meds a little. Vitals:  height is 6' (1.829 m) and weight is 240 lb (108.9 kg). His temperature is 96 F (35.6 C) (abnormal). His blood pressure is 144/76 (abnormal) and his pulse is 84. His oxygen saturation is 95%.   BMI: Estimated body mass index is 32.55 kg/m as calculated from the following:   Height as of this encounter: 6' (1.829 m).   Weight as of this encounter: 240 lb (108.9 kg).  Last encounter: 06/30/2023. Last procedure: Visit date not found.  Reason for encounter: medication management.  The patient indicates doing well with current medication regimen.  No side effects or any adverse reaction reported to this medication.  The patient reports chronic low back pain radiating to the hip and extending from the lateral to the anterior aspect of the right leg.  He requested initiation of physical therapy again to help evaluate his pain and improve functional status.  Pharmacotherapy Assessment   Morphine  (MS Contin ) 15 mg every 8 hours as needed for pain with a quantity of 90. MME=45 Monitoring:  [place] PMP: PDMP reviewed during this encounter.       Pharmacotherapy: No side-effects or adverse reactions reported. Compliance: No problems identified. Effectiveness: Clinically acceptable.  Alan Nathanel PARAS, RN  09/20/2023  9:28 AM  Sign when Signing Visit Safety precautions to be maintained throughout the outpatient stay will include: orient to surroundings, keep bed in low position, maintain call bell within reach at all times, provide assistance with transfer out of bed and ambulation.   Nursing Pain Medication Assessment:  Safety precautions to be maintained throughout the outpatient stay will include: orient to surroundings, keep bed in low position, maintain call bell within reach at all times, provide assistance with transfer out of bed and ambulation.  Medication Inspection Compliance: Pill count conducted under aseptic conditions, in front of the patient. Neither the pills nor the bottle was removed from the patient's sight at any time. Once count was completed pills were immediately returned to the  patient in their original bottle.  Medication: Morphine  ER (MSContin) Pill/Patch Count:  090 Pill/Patch Appearance: No markings Bottle Appearance: Standard pharmacy container. Clearly labeled. Filled Date: 6 / 10 / 2025 Last Medication intake:  Ran out of medicine more than 48 hours ago    UDS:  Summary  Date Value Ref Range Status  07/08/2022 Note  Final    Comment:    ==================================================================== ToxASSURE Select 13 (MW) ==================================================================== Test                             Result       Flag       Units  Drug Present and Declared for Prescription Verification   Alprazolam                      58           EXPECTED   ng/mg creat   Alpha-hydroxyalprazolam        142          EXPECTED   ng/mg creat    Source of alprazolam  is a scheduled prescription medication. Alpha-    hydroxyalprazolam is an expected metabolite of alprazolam .    Morphine                        15430        EXPECTED   ng/mg creat   Normorphine                    570          EXPECTED   ng/mg creat    Potential sources of large amounts of morphine  in the absence of    codeine include administration of morphine  or use of heroin.     Normorphine is an expected metabolite of morphine .    Hydromorphone                  362          EXPECTED   ng/mg creat    Hydromorphone may be present as a metabolite of morphine ;    concentrations of hydromorphone rarely exceed 5% of the morphine     concentration when this is the source of hydromorphone.  ==================================================================== Test                      Result    Flag   Units      Ref Range   Creatinine              50               mg/dL      >=79 ==================================================================== Declared Medications:  The flagging and interpretation on this report are based on the  following declared medications.  Unexpected results may arise from  inaccuracies in the declared medications.   **Note: The testing  scope of this panel includes these medications:   Alprazolam   Morphine  (MS Contin )   **Note: The testing scope of this panel does not include the  following reported medications:   Acetaminophen  (Tylenol )  Aspirin  Bupropion  (Wellbutrin  XL)  Carvedilol  (Coreg )  Desoximetasone  (Topicort )  Empagliflozin  (Jardiance )  Epinephrine  (EpiPen )  Escitalopram  (Lexapro )  Gabapentin  (Neurontin )  Hydrochlorothiazide   Insulin  (Tresiba )  Loratadine  (Claritin )  Lubiprostone  (Amitiza )  Metformin   Methocarbamol  Mometasone  (Nasonex )  Naloxone  (Narcan )  Olmesartan  (Benicar )  Pantoprazole  (Protonix )  Rosuvastatin  (Crestor )  Supplement (Fiber)  Tamsulosin  (Flomax )  Tirzepatide  (Mounjaro ) ==================================================================== For clinical consultation, please call [phone]. ====================================================================     No results found for: CBDTHCR No results found for: D8THCCBX No results found for: D9THCCBX  ROS  Constitutional: Denies any fever or chills Gastrointestinal: No reported hemesis, hematochezia, vomiting, or acute GI distress Musculoskeletal: Lower back pain that radiates down to hip and extending from the lateral to the anterior aspect of the right leg. Neurological: No reported episodes of acute onset apraxia, aphasia, dysarthria, agnosia, amnesia, paralysis, loss of coordination, or loss of consciousness  Medication Review  ALPRAZolam , Benefiber, EPINEPHrine , FreeStyle Libre 14 Day Reader, FreeStyle Libre 14 Day Sensor, Insulin  Pen Needle, acetaminophen , aspirin EC, buPROPion , carvedilol , desoximetasone , empagliflozin , escitalopram , gabapentin , glucose blood, hydrochlorothiazide , insulin  glargine, insulin  lispro, loratadine , lubiprostone , metFORMIN , methocarbamol, mometasone , morphine , naloxone , olmesartan , pantoprazole , rosuvastatin , sennosides-docusate sodium, and tamsulosin   History Review  Allergy:  Mr. Wingard is allergic to contrast media [iodinated contrast media], iodine, and shellfish allergy. Drug: Mr. Monks  reports no history of drug use. Alcohol:  reports no history of alcohol use. Tobacco:  reports that he has quit smoking. His smoking use included cigarettes. He has never used smokeless tobacco. Social: Mr. Emel  reports that he has quit smoking. His smoking use included cigarettes. He has never used smokeless tobacco. He reports that he does not drink alcohol and does not use drugs. Medical:  has a past medical history of Acute postoperative pain (11/24/2016), Anxiety, Chronic hip pain (Right) (12/05/2014), Chronic lumbar pain, Depression, Diabetes mellitus without complication (HCC), Hyperlipidemia, Hypertension, Kidney stones, and Migraines. Surgical: Mr. Guidroz  has a past surgical history that includes Total hip arthroplasty; Tonsillectomy; and right hip surgery. Family: family history includes Cancer in his mother; Diabetes in his father, sister, and sister; Heart disease in his father; Stroke in his father.  Laboratory Chemistry Profile   Renal Lab Results  Component Value Date   BUN 11 07/21/2023   CREATININE 0.86 07/21/2023   BCR 13 07/21/2023   GFR 84.35 09/22/2022   GFRAA >60 03/26/2018   GFRNONAA >60 03/26/2018    Hepatic Lab Results  Component Value Date   AST 19 07/21/2023   ALT 12 07/21/2023   ALBUMIN 4.0 07/21/2023   ALKPHOS 95 07/21/2023    Electrolytes Lab Results  Component Value Date   NA 141 07/21/2023   K 4.0 07/21/2023   CL 99 07/21/2023   CALCIUM  9.6 07/21/2023   MG 1.8 03/06/2015    Bone No results found for: VD25OH, VD125OH2TOT, CI6874NY7, CI7874NY7, 25OHVITD1, 25OHVITD2, 25OHVITD3, TESTOFREE, TESTOSTERONE  Inflammation (CRP: Acute Phase) (ESR: Chronic Phase) Lab Results  Component Value Date   CRP 0.5 03/06/2015   ESRSEDRATE 45 (H) 12/14/2016         Note: Above Lab results reviewed.  Recent Imaging  Review  ECHOCARDIOGRAM COMPLETE    ECHOCARDIOGRAM REPORT       Patient Name:   LINKEN MCGLOTHEN Date of Exam: 05/19/2023 Medical Rec #:  [id number]        Height:       73.0 in Accession #:    [id number]       Weight:       242.0 lb Date of Birth:  Sep 08, 1945        BSA:          2.333 m Patient Age:    78 years         BP:           116/74  mmHg Patient Gender: M                HR:           84 bpm. Exam Location:  [place]  Procedure: 2D Echo, Cardiac Doppler, Color Doppler and Strain Analysis (Both            Spectral and Color Flow Doppler were utilized during procedure).  Indications:    R07.9* Chest pain, unspecified   History:        Patient has no prior history of Echocardiogram examinations.                 Signs/Symptoms:Chest Pain and Edema; Risk Factors:Hypertension,                 Dyslipidemia and Former Smoker.   Sonographer:    Doyal Point MHA, BS, RDCS Referring Phys: [id number] BRIAN AGBOR-ETANG  IMPRESSIONS   1. Left ventricular ejection fraction, by estimation, is 60 to 65%. The left ventricle has normal function. The left ventricle has no regional wall motion abnormalities. Left ventricular diastolic parameters are consistent with Grade I diastolic  dysfunction (impaired relaxation). The average left ventricular global longitudinal strain is -17.3 %. The global longitudinal strain is normal.  2. Right ventricular systolic function is normal. The right ventricular size is normal. Tricuspid regurgitation signal is inadequate for assessing PA pressure.  3. The mitral valve is normal in structure. No evidence of mitral valve regurgitation. No evidence of mitral stenosis.  4. The aortic valve is tricuspid. Aortic valve regurgitation is not visualized. No aortic stenosis is present.  5. The inferior vena cava is normal in size with greater than 50% respiratory variability, suggesting right atrial pressure of 3 mmHg.  FINDINGS  Left Ventricle: Left ventricular  ejection fraction, by estimation, is 60 to 65%. The left ventricle has normal function. The left ventricle has no regional wall motion abnormalities. The average left ventricular global longitudinal strain is -17.3 %.  Strain was performed and the global longitudinal strain is normal. The left ventricular internal cavity size was normal in size. There is no left ventricular hypertrophy. Left ventricular diastolic parameters are consistent with Grade I diastolic  dysfunction (impaired relaxation).  Right Ventricle: The right ventricular size is normal. No increase in right ventricular wall thickness. Right ventricular systolic function is normal. Tricuspid regurgitation signal is inadequate for assessing PA pressure.  Left Atrium: Left atrial size was normal in size.  Right Atrium: Right atrial size was normal in size.  Pericardium: There is no evidence of pericardial effusion.  Mitral Valve: The mitral valve is normal in structure. No evidence of mitral valve regurgitation. No evidence of mitral valve stenosis.  Tricuspid Valve: The tricuspid valve is normal in structure. Tricuspid valve regurgitation is mild . No evidence of tricuspid stenosis.  Aortic Valve: The aortic valve is tricuspid. Aortic valve regurgitation is not visualized. No aortic stenosis is present. Aortic valve mean gradient measures 2.0 mmHg. Aortic valve peak gradient measures 3.8 mmHg. Aortic valve area, by VTI measures 2.61  cm.  Pulmonic Valve: The pulmonic valve was normal in structure. Pulmonic valve regurgitation is not visualized. No evidence of pulmonic stenosis.  Aorta: The aortic root is normal in size and structure.  Venous: The inferior vena cava is normal in size with greater than 50% respiratory variability, suggesting right atrial pressure of 3 mmHg.  IAS/Shunts: No atrial level shunt detected by color flow Doppler.  Additional Comments: 3D was performed not requiring image post  processing on an  independent workstation and was indeterminate.    LEFT VENTRICLE PLAX 2D LVIDd:         4.17 cm   Diastology LVIDs:         2.16 cm   LV e' medial:    4.90 cm/s LV PW:         1.02 cm   LV E/e' medial:  9.3 LV IVS:        1.11 cm   LV e' lateral:   6.09 cm/s LVOT diam:     1.90 cm   LV E/e' lateral: 7.5 LV SV:         46 LV SV Index:   20        2D Longitudinal Strain LVOT Area:     2.84 cm  2D Strain GLS Avg:     -17.3 %    RIGHT VENTRICLE RV Basal diam:  2.77 cm RV Mid diam:    2.40 cm RV S prime:     10.80 cm/s TAPSE (M-mode): 1.3 cm  LEFT ATRIUM             Index        RIGHT ATRIUM           Index LA diam:        3.00 cm 1.29 cm/m   RA Area:     11.00 cm LA Vol (A2C):   35.8 ml 15.34 ml/m  RA Volume:   20.40 ml  8.74 ml/m LA Vol (A4C):   24.2 ml 10.37 ml/m LA Biplane Vol: 29.3 ml 12.56 ml/m  AORTIC VALVE AV Area (Vmax):    2.27 cm AV Area (Vmean):   2.26 cm AV Area (VTI):     2.61 cm AV Vmax:           98.10 cm/s AV Vmean:          65.100 cm/s AV VTI:            0.178 m AV Peak Grad:      3.8 mmHg AV Mean Grad:      2.0 mmHg LVOT Vmax:         78.50 cm/s LVOT Vmean:        52.000 cm/s LVOT VTI:          0.164 m LVOT/AV VTI ratio: 0.92   AORTA Ao Sinus diam: 3.73 cm Ao Asc diam:   3.20 cm  MITRAL VALVE MV Area (PHT): 3.31 cm    SHUNTS MV Decel Time: 229 msec    Systemic VTI:  0.16 m MV E velocity: 45.50 cm/s  Systemic Diam: 1.90 cm MV A velocity: 69.70 cm/s MV E/A ratio:  0.65  Evalene Lunger MD Electronically signed by Evalene Lunger MD Signature Date/Time: 05/19/2023/5:29:37 PM      Final   Note: Reviewed        Physical Exam  Vitals: BP (!) 144/76   Pulse 84   Temp (!) 96 F (35.6 C)   Ht 6' (1.829 m)   Wt 240 lb (108.9 kg)   SpO2 95%   BMI 32.55 kg/m  BMI: Estimated body mass index is 32.55 kg/m as calculated from the following:   Height as of this encounter: 6' (1.829 m).   Weight as of this encounter: 240 lb (108.9  kg). Ideal: Ideal body weight: 77.6 kg (171 lb 1.2 oz) Adjusted ideal body weight: 90.1 kg (198 lb 10.3 oz) General appearance: Well nourished, well developed, and  well hydrated. In no apparent acute distress Mental status: Alert, oriented x 3 (person, place, & time)       Respiratory: No evidence of acute respiratory distress Eyes: PERLA   Assessment   Diagnosis Status  1. Chronic low back pain (1ry area of Pain) (Bilateral) w/o sciatica   2. Chronic lower extremity pain (2ry area of Pain) (Bilateral) (R>L)   3. DDD (degenerative disc disease), cervical   4. Degeneration of intervertebral disc of lumbar region with discogenic back pain and lower extremity pain   5. Chronic neck pain (midline over the C7 spinous processes) (L>R)   6. Lumbar facet syndrome (Bilateral) (R>L)   7. Chronic knee pain (Bilateral)   8. 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 Chronic pain syndrome   10. Pharmacologic therapy   11. Chronic use of opiate for therapeutic purpose   12. Encounter for medication management   13. Encounter for chronic pain management    Controlled Controlled Controlled   Updated Problems: No problems updated.  Plan of Care  Problem-specific:  Assessment and Plan We will continue on current medication regimen.  Prescribing drug monitoring (PDMP) reviewed; findings consistent with the use of prescribed medication and no evidence of narcotic misuse or abuse. Routine UDS ordered today.  Schedule follow-up in 90 days for medication management.  No other new issues or problems reported at this visit.  Plan: Physical therapy for 6 weeks    [name] has a current medication list which includes the following long-term medication(s): bupropion , carvedilol , escitalopram , gabapentin , hydrochlorothiazide , insulin  lispro, lantus solostar, loratadine , lubiprostone , metformin , morphine , [START ON 10/20/2023] morphine , [START ON 11/19/2023] morphine ,  naloxone , olmesartan , pantoprazole , rosuvastatin , benefiber, and mometasone .  Pharmacotherapy (Medications Ordered): Meds ordered this encounter  Medications   morphine  (MS CONTIN ) 15 MG 12 hr tablet    Sig: Take 1 tablet (15 mg total) by mouth every 8 (eight) hours. Must last 30 days. Do not break tablet    Dispense:  90 tablet    Refill:  0    DO NOT: delete (not duplicate); no partial-fill (will deny script to complete), no refill request (F/U required). DISPENSE: 1 day early if closed on fill date. WARN: No CNS-depressants within 8 hrs of med.   morphine  (MS CONTIN ) 15 MG 12 hr tablet    Sig: Take 1 tablet (15 mg total) by mouth every 8 (eight) hours. Must last 30 days. Do not break tablet    Dispense:  90 tablet    Refill:  0    DO NOT: delete (not duplicate); no partial-fill (will deny script to complete), no refill request (F/U required). DISPENSE: 1 day early if closed on fill date. WARN: No CNS-depressants within 8 hrs of med.   morphine  (MS CONTIN ) 15 MG 12 hr tablet    Sig: Take 1 tablet (15 mg total) by mouth every 8 (eight) hours. Must last 30 days. Do not break tablet    Dispense:  90 tablet    Refill:  0    DO NOT: delete (not duplicate); no partial-fill (will deny script to complete), no refill request (F/U required). DISPENSE: 1 day early if closed on fill date. WARN: No CNS-depressants within 8 hrs of med.   Orders:  Orders Placed This Encounter  Procedures   ToxASSURE Select 13 (MW), Urine    Volume: 30 ml(s). Minimum 3 ml of urine is needed. Document temperature of fresh sample. Indications: Long term (current) use  of opiate analgesic (Z79.891)    Release to patient:   Immediate   Ambulatory referral to Physical Therapy    Referral Priority:   Routine    Referral Type:   Physical Medicine    Referral Reason:   Specialty Services Required    Referred to Provider:   Lincoln Barefoot, PT    Requested Specialty:   Physical Therapy    Number of Visits Requested:   1         Return in about 3 months (around 12/21/2023) for (F2F), (MM), Emmy Blanch NP.    Recent Visits No visits were found meeting these conditions. Showing recent visits within past 90 days and meeting all other requirements Today's Visits Date Type Provider Dept  09/20/23 Office Visit Marolyn Urschel K, NP [facility]  Showing today's visits and meeting all other requirements Future Appointments Date Type Provider Dept  12/13/23 Appointment Darrold Bezek K, NP [facility]  Showing future appointments within next 90 days and meeting all other requirements  I discussed the assessment and treatment plan with the patient. The patient was provided an opportunity to ask questions and all were answered. The patient agreed with the plan and demonstrated an understanding of the instructions.  Patient advised to call back or seek an in-person evaluation if the symptoms or condition worsens.  Duration of encounter: 30 minutes.  Total time on encounter, as per AMA guidelines included both the face-to-face and non-face-to-face time personally spent by the physician and/or other qualified health care professional(s) on the day of the encounter (includes time in activities that require the physician or other qualified health care professional and does not include time in activities normally performed by clinical staff). Physician's time may include the following activities when performed: Preparing to see the patient (e.g., pre-charting review of records, searching for previously ordered imaging, lab work, and nerve conduction tests) Review of prior analgesic pharmacotherapies. Reviewing PMP Interpreting ordered tests (e.g., lab work, imaging, nerve conduction tests) Performing post-procedure evaluations, including interpretation of diagnostic procedures Obtaining and/or reviewing separately obtained history Performing a medically appropriate examination and/or  evaluation Counseling and educating the patient/family/caregiver Ordering medications, tests, or procedures Referring and communicating with other health care professionals (when not separately reported) Documenting clinical information in the electronic or other health record Independently interpreting results (not separately reported) and communicating results to the patient/ family/caregiver Care coordination (not separately reported)  Note by: Shaelynn Dragos K Yassmin Binegar, NP (TTS and AI technology used. I apologize for any typographical errors that were not detected and corrected.) Date: 09/20/2023; Time: 10:51 AM

## 2023-09-20 NOTE — Progress Notes (Signed)
 Safety precautions to be maintained throughout the outpatient stay will include: orient to surroundings, keep bed in low position, maintain call bell within reach at all times, provide assistance with transfer out of bed and ambulation.   Nursing Pain Medication Assessment:  Safety precautions to be maintained throughout the outpatient stay will include: orient to surroundings, keep bed in low position, maintain call bell within reach at all times, provide assistance with transfer out of bed and ambulation.  Medication Inspection Compliance: Pill count conducted under aseptic conditions, in front of the patient. Neither the pills nor the bottle was removed from the patient's sight at any time. Once count was completed pills were immediately returned to the patient in their original bottle.  Medication: Morphine  ER (MSContin) Pill/Patch Count: 090 Pill/Patch Appearance: No markings Bottle Appearance: Standard pharmacy container. Clearly labeled. Filled Date: 6 / 61 / 2025 Last Medication intake:  Ran out of medicine more than 48 hours ago

## 2023-09-22 LAB — TOXASSURE SELECT 13 (MW), URINE

## 2023-09-29 NOTE — Progress Notes (Signed)
   09/29/2023  Patient ID: Alan Schmidt, male   DOB: 02-24-1945, 78 y.o.   MRN: [id number]  Pharmacy Quality Measure Review  This patient is appearing on a report for being at risk of failing the adherence measure for hypertension (ACEi/ARB) medications this calendar year.   Medication: Olmesartan  Last fill date: 09/15/23 for 30 day supply  Insurance report was not up to date. No action needed at this time.   [name], PharmD Clinical Pharmacist [phone]

## 2023-10-07 DIAGNOSIS — E118 Type 2 diabetes mellitus with unspecified complications: Secondary | ICD-10-CM | POA: Diagnosis not present

## 2023-10-07 DIAGNOSIS — Z794 Long term (current) use of insulin: Secondary | ICD-10-CM | POA: Diagnosis not present

## 2023-10-18 ENCOUNTER — Other Ambulatory Visit: Payer: Self-pay

## 2023-10-18 ENCOUNTER — Other Ambulatory Visit: Payer: Self-pay | Admitting: Internal Medicine

## 2023-10-18 DIAGNOSIS — I10 Essential (primary) hypertension: Secondary | ICD-10-CM

## 2023-10-18 DIAGNOSIS — E1142 Type 2 diabetes mellitus with diabetic polyneuropathy: Secondary | ICD-10-CM

## 2023-10-18 DIAGNOSIS — K219 Gastro-esophageal reflux disease without esophagitis: Secondary | ICD-10-CM

## 2023-10-18 DIAGNOSIS — F32A Depression, unspecified: Secondary | ICD-10-CM

## 2023-10-18 DIAGNOSIS — N401 Enlarged prostate with lower urinary tract symptoms: Secondary | ICD-10-CM

## 2023-10-19 NOTE — Progress Notes (Signed)
   10/19/2023  Patient ID: Alan Schmidt, male   DOB: 10/13/1945, 78 y.o.   MRN: [id number]  Pharmacy Quality Measure Review  This patient is appearing on a report for being at risk of failing the adherence measure for hypertension (ACEi/ARB) medications this calendar year.   Medication: Olmesartan  Last fill date: 10/15/23 for 30 day supply  Insurance report was not up to date. No action needed at this time.   [name], PharmD Clinical Pharmacist [phone]

## 2023-10-25 ENCOUNTER — Other Ambulatory Visit: Payer: Self-pay | Admitting: Internal Medicine

## 2023-10-25 DIAGNOSIS — F419 Anxiety disorder, unspecified: Secondary | ICD-10-CM

## 2023-10-25 DIAGNOSIS — E119 Type 2 diabetes mellitus without complications: Secondary | ICD-10-CM

## 2023-11-05 ENCOUNTER — Encounter: Payer: Self-pay | Admitting: Internal Medicine

## 2023-11-05 ENCOUNTER — Ambulatory Visit (INDEPENDENT_AMBULATORY_CARE_PROVIDER_SITE_OTHER): Admitting: Internal Medicine

## 2023-11-05 VITALS — BP 116/60 | HR 76 | Temp 98.1°F | Ht 73.0 in | Wt 244.2 lb

## 2023-11-05 DIAGNOSIS — I10 Essential (primary) hypertension: Secondary | ICD-10-CM

## 2023-11-05 DIAGNOSIS — Z794 Long term (current) use of insulin: Secondary | ICD-10-CM

## 2023-11-05 DIAGNOSIS — M7061 Trochanteric bursitis, right hip: Secondary | ICD-10-CM | POA: Diagnosis not present

## 2023-11-05 DIAGNOSIS — F419 Anxiety disorder, unspecified: Secondary | ICD-10-CM

## 2023-11-05 DIAGNOSIS — F32A Depression, unspecified: Secondary | ICD-10-CM

## 2023-11-05 DIAGNOSIS — G894 Chronic pain syndrome: Secondary | ICD-10-CM

## 2023-11-05 DIAGNOSIS — E1142 Type 2 diabetes mellitus with diabetic polyneuropathy: Secondary | ICD-10-CM

## 2023-11-05 DIAGNOSIS — E119 Type 2 diabetes mellitus without complications: Secondary | ICD-10-CM

## 2023-11-05 LAB — POCT CBG (FASTING - GLUCOSE)-MANUAL ENTRY: Glucose Fasting, POC: 168 mg/dL — AB (ref 70–99)

## 2023-11-05 MED ORDER — ALPRAZOLAM 1 MG PO TABS: ORAL_TABLET | ORAL | 1 refills | Status: DC

## 2023-11-05 MED ORDER — MELOXICAM 15 MG PO TABS: 15.0000 mg | ORAL_TABLET | Freq: Every day | ORAL | 0 refills | Status: DC

## 2023-11-05 NOTE — Progress Notes (Signed)
 Established Patient Office Visit  Subjective:  Patient ID: Alan Schmidt, male    DOB: 01-28-1946  Age: 78 y.o. MRN: [id number]  Chief Complaint  Patient presents with   Follow-up    Rx refills    No new complaints, here for lab review and medication refills. Failed to have previsit labs done. CBG readings reviewed and 30-90 day average was 153-168 without any hypoglycemic readings.    No other concerns at this time.   Past Medical History:  Diagnosis Date   Acute postoperative pain 11/24/2016   Anxiety    Chronic hip pain (Right) 12/05/2014   Chronic lumbar pain    Depression    Diabetes mellitus without complication (HCC)    Hyperlipidemia    Hypertension    Kidney stones    Migraines     Past Surgical History:  Procedure Laterality Date   right hip surgery     4 surgeries   TONSILLECTOMY     TOTAL HIP ARTHROPLASTY      Social History   Socioeconomic History   Marital status: Divorced    Spouse name: 1 Bio; 6 Adopted   Number of children: 7   Years of education: doctorate   Highest education level: Doctorate  Occupational History   Occupation: Retired  Tobacco Use   Smoking status: Former    Types: Cigarettes   Smokeless tobacco: Never  Vaping Use   Vaping status: Never Used  Substance and Sexual Activity   Alcohol use: No    Alcohol/week: 0.0 standard drinks of alcohol   Drug use: No   Sexual activity: Not Currently  Other Topics Concern   Not on file  Social History Narrative   Per patient he has 1 biological child and 6 adopted children   Social Drivers of Corporate investment banker Strain: High Risk (03/31/2023)   Received from [facility]   Overall Financial Resource Strain (CARDIA)    Difficulty of Paying Living Expenses: Very hard  Food Insecurity: Food Insecurity Present (03/31/2023)   Received from [facility]   Hunger Vital Sign    Within the past 12 months, you worried that your food would  run out before you got the money to buy more.: Sometimes true    Within the past 12 months, the food you bought just didn't last and you didn't have money to get more.: Sometimes true  Transportation Needs: Unmet Transportation Needs (03/31/2023)   Received from [facility] - Transportation    In the past 12 months, has lack of transportation kept you from medical appointments or from getting medications?: Yes    Lack of Transportation (Non-Medical): Yes  Physical Activity: Inactive (02/16/2023)   Exercise Vital Sign    Days of Exercise per Week: 0 days    Minutes of Exercise per Session: 0 min  Stress: No Stress Concern Present (02/16/2023)   [employer] of Occupational Health - Occupational Stress Questionnaire    Feeling of Stress : Only a little  Social Connections: Socially Isolated (02/16/2023)   Social Connection and Isolation Panel    Frequency of Communication with Friends and Family: More than three times a week    Frequency of Social Gatherings with Friends and Family: Never    Attends Religious Services: Never    Database administrator or Organizations: No    Attends Banker Meetings: Never    Marital Status: Divorced  Catering manager  Violence: Not At Risk (02/16/2023)   Humiliation, Afraid, Rape, and Kick questionnaire    Fear of Current or Ex-Partner: No    Emotionally Abused: No    Physically Abused: No    Sexually Abused: No    Family History  Problem Relation Age of Onset   Cancer Mother    Heart disease Father    Stroke Father    Diabetes Father    Diabetes Sister    Diabetes Sister    Mental illness Neg Hx     Allergies  Allergen Reactions   Contrast Media [Iodinated Contrast Media] Swelling   Iodine Swelling   Shellfish Allergy Nausea And Vomiting and Swelling    Outpatient Medications Prior to Visit  Medication Sig   ACCU-CHEK GUIDE test strip USE AS INSTRUCTED DX CODE: E11.9   acetaminophen  (TYLENOL )  500 MG tablet Take 500 mg by mouth every 6 (six) hours as needed.   ALPRAZolam  (XANAX ) 1 MG tablet TAKE 1/2 TABLET BY MOUTH 2 TIMES DAILY AS NEEDED FOR ANXIETY.   aspirin EC 81 MG tablet Take 81 mg by mouth daily.   buPROPion  (WELLBUTRIN  XL) 150 MG 24 hr tablet TAKE 1 TABLET (150 MG TOTAL) BY MOUTH DAILY FOR 7 DAYS, THEN 2 TABLETS (300 MG TOTAL) DAILY.   carvedilol  (COREG ) 25 MG tablet Take 1 tablet (25 mg total) by mouth 2 (two) times daily.   Continuous Blood Gluc Receiver (FREESTYLE LIBRE 14 DAY READER) DEVI 1 Device by Does not apply route daily. Use to scan to check blood sugar up to 7 times daily; E11.42,   Continuous Glucose Sensor (FREESTYLE LIBRE 14 DAY SENSOR) MISC 1 Device by Does not apply route every 14 (fourteen) days. E11.42   desoximetasone  (TOPICORT ) 0.25 % cream APPLY CREAM TO AFFECTED AREA TWO TIMES DAILY, FOR UP TO 7 DAYS, DO NOT APPLY TO FACE   Strength: 0.25 %   empagliflozin  (JARDIANCE ) 25 MG TABS tablet Take 1 tablet by mouth daily.   EPINEPHrine  (EPIPEN  2-PAK) 0.3 mg/0.3 mL IJ SOAJ injection Inject 0.3 mg into the muscle as needed for anaphylaxis.   escitalopram  (LEXAPRO ) 20 MG tablet Take 1 tablet by mouth daily.   gabapentin  (NEURONTIN ) 250 MG/5ML solution Take 6 ml by mouth at bedtime.   glucose blood (ACCU-CHEK GUIDE TEST) test strip Use as instructed   hydrochlorothiazide  (HYDRODIURIL ) 25 MG tablet Take 1 tablet by mouth daily.   insulin  lispro (HUMALOG  KWIKPEN) 100 UNIT/ML KwikPen INJECT 5 TO 7 UNITS UNDER THE SKIN THREE TIMES DAILY WITH MEALS   Insulin  Pen Needle (BD PEN NEEDLE NANO U/F) 32G X 4 MM MISC USE EVERY DAY   LANTUS SOLOSTAR 100 UNIT/ML Solostar Pen Inject 8 Units into the skin daily.   loratadine  (CLARITIN ) 10 MG tablet Take 1 tablet (10 mg total) by mouth daily.   lubiprostone  (AMITIZA ) 8 MCG capsule TAKE 1 CAPSULE (8 MCG TOTAL) BY MOUTH 2 (TWO) TIMES DAILY WITH A MEAL. SWALLOW THE MEDICATION WHOLE. DO NOT BREAK OR CHEW THE MEDICATION.   metFORMIN   (GLUCOPHAGE ) 500 MG tablet Take 1 tablet (500 mg total) by mouth 2 (two) times daily with a meal.   methocarbamol (ROBAXIN) 500 MG tablet Take 500 mg by mouth 2 (two) times daily.   mometasone  (NASONEX ) 50 MCG/ACT nasal spray Instill 2 sprays into each nostril once daily.   morphine  (MS CONTIN ) 15 MG 12 hr tablet Take 1 tablet (15 mg total) by mouth every 8 (eight) hours. Must last 30 days. Do not break  tablet   naloxone  (NARCAN ) nasal spray 4 mg/0.1 mL Place 1 spray into the nose as needed for up to 2 doses (for opioid-induced respiratory depresssion). In case of emergency (overdose), spray once into each nostril. If no response within 3 minutes, repeat application and call 911.   olmesartan  (BENICAR ) 40 MG tablet Take 1 tablet by mouth daily.   pantoprazole  (PROTONIX ) 40 MG tablet Take 1 tablet by mouth daily.   rosuvastatin  (CRESTOR ) 40 MG tablet Take 1 tablet (40 mg total) by mouth daily.   sennosides-docusate sodium (SENOKOT-[place]) 8.6-50 MG tablet Take 1 tablet by mouth daily as needed.    tamsulosin  (FLOMAX ) 0.4 MG CAPS capsule Take 2 capsules by mouth daily after breakfast.   Wheat Dextrin (BENEFIBER) POWD Take 6 g by mouth 3 (three) times daily before meals. (2 tsp = 6 g)   morphine  (MS CONTIN ) 15 MG 12 hr tablet Take 1 tablet (15 mg total) by mouth every 8 (eight) hours. Must last 30 days. Do not break tablet   [START ON 11/19/2023] morphine  (MS CONTIN ) 15 MG 12 hr tablet Take 1 tablet (15 mg total) by mouth every 8 (eight) hours. Must last 30 days. Do not break tablet   tamsulosin  (FLOMAX ) 0.4 MG CAPS capsule Take 0.8 mg by mouth daily.   No facility-administered medications prior to visit.    Review of Systems  Constitutional:  Positive for weight loss (1 lbs). Negative for malaise/fatigue.  HENT:  Positive for congestion.   Eyes:  Negative for blurred vision.       Floaters in left eye  Respiratory: Negative.    Cardiovascular: Negative.   Gastrointestinal:  Positive for  constipation and heartburn.  Genitourinary:  Positive for frequency (+ nocturia).  Musculoskeletal:  Positive for back pain.  Neurological:  Positive for tingling.  Endo/Heme/Allergies:  Positive for environmental allergies.  Psychiatric/Behavioral:  Positive for depression. The patient is nervous/anxious.        Objective:   BP 116/60   Pulse 76   Temp 98.1 F (36.7 C)   Ht 6' 1 (1.854 m)   Wt 244 lb 3.2 oz (110.8 kg)   SpO2 95%   BMI 32.22 kg/m   Vitals:   11/05/23 1431  BP: 116/60  Pulse: 76  Temp: 98.1 F (36.7 C)  Height: 6' 1 (1.854 m)  Weight: 244 lb 3.2 oz (110.8 kg)  SpO2: 95%  BMI (Calculated): 32.23    Physical Exam Vitals reviewed.  Constitutional:      Appearance: Normal appearance. He is overweight.  HENT:     Head: Normocephalic.     Left Ear: There is no impacted cerumen.     Nose: Nose normal.     Mouth/Throat:     Mouth: Mucous membranes are moist.     Pharynx: No posterior oropharyngeal erythema.  Eyes:     Extraocular Movements: Extraocular movements intact.     Pupils: Pupils are equal, round, and reactive to light.  Cardiovascular:     Rate and Rhythm: Regular rhythm.     Chest Wall: PMI is not displaced.     Pulses: Normal pulses.     Heart sounds: Normal heart sounds. No murmur heard. Pulmonary:     Effort: Pulmonary effort is normal.     Breath sounds: Normal air entry. No rhonchi or rales.  Abdominal:     General: Abdomen is flat. Bowel sounds are normal. There is no distension.     Palpations: Abdomen is soft. There is  no hepatomegaly, splenomegaly or mass.     Tenderness: There is no abdominal tenderness.  Musculoskeletal:        General: Normal range of motion.     Cervical back: Normal range of motion and neck supple.     Left hip: Tenderness present.     Right lower leg: No edema.     Left lower leg: No edema.     Comments: Tender over R bursa  Skin:    General: Skin is warm and dry.  Neurological:     General: No  focal deficit present.     Mental Status: He is alert and oriented to person, place, and time.     Cranial Nerves: No cranial nerve deficit.     Motor: No weakness.  Psychiatric:        Mood and Affect: Mood normal.        Behavior: Behavior normal.      Results for orders placed or performed in visit on 11/05/23  POCT CBG (Fasting - Glucose)  Result Value Ref Range   Glucose Fasting, POC 168 (A) 70 - 99 mg/dL        Assessment & Plan:   Patient Active Problem List   Diagnosis Date Noted   Lumbar facet joint pain 04/19/2023   Osteoarthritis of lumbar spine 04/19/2023   Lumbar facet arthropathy (Multilevel) (Bilateral) 04/19/2023   Controlled substance agreement signed 04/11/2023   Need for hepatitis C screening test 04/11/2023   Hand numbness 02/16/2023   Trigger point of shoulder region (Left) 10/07/2022   Musculoskeletal disorder involving upper trapezius muscle (Left) 10/07/2022   Left thigh pain 10/07/2022   Chronic hip pain (Left) 10/07/2022   Impaired range of motion of hip (Left) 10/07/2022   Physical deconditioning 10/07/2022   Spinal stenosis, lumbar region, 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) 10/07/2022   Encounter for medication counseling 09/22/2022   Class 2 severe obesity with serious comorbidity and body mass index (BMI) of 36.0 to 36.9 in adult 08/10/2022   Type 2 diabetes mellitus treated with insulin  (HCC) 08/07/2022   Abnormal MRI, lumbar spine (06/02/2022) 06/03/2022   Musculoskeletal chest pain 09/02/2021   Bilateral lower extremity edema 04/30/2021   Bilateral leg weakness 04/30/2021   DDD (degenerative disc disease), cervical 07/24/2020   Non compliance w medication regimen 07/24/2020   Throat tightness 05/14/2020   Left flank pain 05/14/2020   History of kidney stones 05/14/2020   Chronic use of opiate for therapeutic purpose 04/24/2020   Left-sided back pain 03/27/2020   Excessive gas 03/27/2020   Balance problem 03/27/2020    Uncomplicated opioid dependence (HCC) 01/24/2020   Long-term current use of benzodiazepine 01/24/2020   Chronic low back pain (1ry area of Pain) (Bilateral) (R>L) w/o sciatica 12/14/2019   Pharmacologic therapy 05/29/2019   Disorder of skeletal system 05/29/2019   Problems influencing health status 05/29/2019   Fall 05/15/2019   Skin lesion 05/15/2019   Positive fecal occult blood test 05/15/2019   Osteoarthritis involving multiple joints 02/07/2019   Shortened hamstring muscle 02/07/2019   Spondylosis without myelopathy or radiculopathy, lumbosacral region 12/27/2018   History of allergy to shellfish 12/27/2018   History of allergy to povidone-iodine topical antiseptic 12/27/2018    Class: History of   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) 03/15/2018   Thumb pain (Right) 03/03/2018   Lesion of skin of foot 01/17/2018   Chronic knee pain (Bilateral) 05/18/2017   Coccygodynia 04/19/2017   DDD (degenerative disc  disease), lumbar 04/19/2017   Chronic musculoskeletal pain 04/19/2017   Chronic headache 03/16/2017   Chronic tension-type headache, intractable 02/24/2017   Vertigo 02/24/2017   Lumbar facet joint osteoarthritis (Bilateral) 02/16/2017   History of allergy to radiographic contrast media 02/16/2017   Nevus 12/10/2016   History of postoperative nausea and vomiting 11/24/2016   Encounter for general adult medical examination with abnormal findings 09/25/2016   Opioid-induced constipation (OIC) 05/14/2016   Cerumen impaction 03/23/2016   Chronic pain syndrome 02/13/2016   Chronic shoulder radicular pain (Left) 12/18/2015   Chronic cervical radicular pain (Left) 12/18/2015   Disturbance of skin sensation 11/13/2015   Anxiety and depression 07/17/2015   Allergic rhinitis 05/22/2015   History of Vasovagal response to spinal injections 04/30/2015    Class: History of   BPH (benign prostatic hyperplasia) 04/17/2015   Diabetes (HCC) 03/19/2015   Chronic  shoulder pain (Bilateral) (R>L) 03/06/2015   Occipital neuralgia (Left) 03/06/2015   Cervicogenic headache (Left) 03/06/2015   Chronic low back pain (Bilateral) (R>L) w/ sciatica (Bilateral) 12/05/2014   Lumbar facet syndrome (Bilateral) (R>L) 12/05/2014   Lumbar spondylosis 12/05/2014   Diabetic peripheral neuropathy (HCC) 12/05/2014   Long term current use of opiate analgesic 12/05/2014   Long term prescription opiate use 12/05/2014   Opiate use (60 MME/Day) 12/05/2014   Opiate dependence (HCC) 12/05/2014   Encounter for therapeutic drug level monitoring 12/05/2014   Chronic neck pain (midline over the C7 spinous processes) (L>R) 12/05/2014   Neurogenic pain 12/05/2014   Neuropathic pain 12/05/2014   Contrast dye allergy 12/05/2014   Chronic lower extremity pain (2ry area of Pain) (Bilateral) (R>L) 12/05/2014   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) (Right L5 dermatome) 12/05/2014   History of total hip replacement (Right) 12/05/2014   Chronic hip pain (Right) 12/05/2014   Class I Morbid obesity (HCC) (68% higher incidence of chronic low back pain) 12/05/2014   Essential hypertension 12/05/2014   GERD (gastroesophageal reflux disease) 12/05/2014   Obstructive sleep apnea 12/05/2014   Hyperlipidemia associated with type 2 diabetes mellitus (HCC) 12/05/2014   Chronic kidney disease (CKD) 12/05/2014   GAD (generalized anxiety disorder) 12/05/2014   Mood disorder 12/05/2014   Abnormal nerve conduction studies (severe bilateral lower extremity polyneuropathy) 12/05/2014    Problem List Items Addressed This Visit       Cardiovascular and Mediastinum   Essential hypertension   Relevant Orders   Comprehensive metabolic panel with GFR     Endocrine   Diabetes (HCC) - Primary (Chronic)   Relevant Orders   Hemoglobin A1c   Lipid panel   Type 2 diabetes mellitus treated with insulin  (HCC)   Relevant Orders   POCT CBG (Fasting - Glucose) (Completed)     Other   Chronic  pain syndrome (Chronic)   Relevant Medications   meloxicam  (MOBIC ) 15 MG tablet   Anxiety and depression   Relevant Medications   ALPRAZolam  (XANAX ) 1 MG tablet   Other Visit Diagnoses       Trochanteric bursitis of right hip       Relevant Medications   meloxicam  (MOBIC ) 15 MG tablet       Return in about 2 months (around 01/05/2024) for fu with labs prior.   Total time spent: 30 minutes  Sherrill Cinderella Perry, MD  11/05/2023   This document may have been prepared by [facility] Voice Recognition software and as such may include unintentional dictation errors.

## 2023-11-06 ENCOUNTER — Other Ambulatory Visit: Payer: Self-pay | Admitting: Internal Medicine

## 2023-11-06 DIAGNOSIS — Z794 Long term (current) use of insulin: Secondary | ICD-10-CM | POA: Diagnosis not present

## 2023-11-06 DIAGNOSIS — E118 Type 2 diabetes mellitus with unspecified complications: Secondary | ICD-10-CM | POA: Diagnosis not present

## 2023-11-06 DIAGNOSIS — F419 Anxiety disorder, unspecified: Secondary | ICD-10-CM

## 2023-11-08 ENCOUNTER — Ambulatory Visit: Admitting: Internal Medicine

## 2023-11-08 ENCOUNTER — Other Ambulatory Visit: Payer: Self-pay

## 2023-11-10 ENCOUNTER — Other Ambulatory Visit: Payer: Self-pay

## 2023-11-10 DIAGNOSIS — F32A Depression, unspecified: Secondary | ICD-10-CM

## 2023-11-10 MED ORDER — ALPRAZOLAM 1 MG PO TABS: ORAL_TABLET | ORAL | 0 refills | Status: DC

## 2023-11-12 ENCOUNTER — Other Ambulatory Visit: Payer: Self-pay

## 2023-11-12 DIAGNOSIS — F32A Depression, unspecified: Secondary | ICD-10-CM

## 2023-11-12 MED ORDER — MOMETASONE FUROATE 50 MCG/ACT NA SUSP: NASAL | 11 refills | Status: AC

## 2023-11-16 NOTE — Progress Notes (Signed)
   11/16/2023  Patient ID: Alan Schmidt, male   DOB: 06-Nov-1945, 78 y.o.   MRN: [id number]  Pharmacy Quality Measure Review  This patient is appearing on a report for being at risk of failing the adherence measure for hypertension (ACEi/ARB) medications this calendar year.   Medication: Olmesartan  Last fill date: 11/14/23 for 90 day supply  Insurance report was not up to date. No action needed at this time.   [name], PharmD Clinical Pharmacist [phone]

## 2023-11-22 ENCOUNTER — Other Ambulatory Visit: Payer: Self-pay | Admitting: Internal Medicine

## 2023-11-22 DIAGNOSIS — I10 Essential (primary) hypertension: Secondary | ICD-10-CM

## 2023-11-22 DIAGNOSIS — N401 Enlarged prostate with lower urinary tract symptoms: Secondary | ICD-10-CM

## 2023-11-22 DIAGNOSIS — E1142 Type 2 diabetes mellitus with diabetic polyneuropathy: Secondary | ICD-10-CM

## 2023-11-22 DIAGNOSIS — K219 Gastro-esophageal reflux disease without esophagitis: Secondary | ICD-10-CM

## 2023-11-22 DIAGNOSIS — F419 Anxiety disorder, unspecified: Secondary | ICD-10-CM

## 2023-11-29 ENCOUNTER — Other Ambulatory Visit: Payer: Self-pay | Admitting: Internal Medicine

## 2023-11-29 DIAGNOSIS — F419 Anxiety disorder, unspecified: Secondary | ICD-10-CM

## 2023-11-30 MED ORDER — PEN NEEDLES 32G X 4 MM MISC: 1 refills | Status: AC

## 2023-12-02 DIAGNOSIS — H43811 Vitreous degeneration, right eye: Secondary | ICD-10-CM | POA: Diagnosis not present

## 2023-12-02 DIAGNOSIS — E113391 Type 2 diabetes mellitus with moderate nonproliferative diabetic retinopathy without macular edema, right eye: Secondary | ICD-10-CM | POA: Diagnosis not present

## 2023-12-02 DIAGNOSIS — E113292 Type 2 diabetes mellitus with mild nonproliferative diabetic retinopathy without macular edema, left eye: Secondary | ICD-10-CM | POA: Diagnosis not present

## 2023-12-02 DIAGNOSIS — H4311 Vitreous hemorrhage, right eye: Secondary | ICD-10-CM | POA: Diagnosis not present

## 2023-12-03 ENCOUNTER — Other Ambulatory Visit: Payer: Self-pay | Admitting: Internal Medicine

## 2023-12-03 DIAGNOSIS — M7061 Trochanteric bursitis, right hip: Secondary | ICD-10-CM

## 2023-12-09 ENCOUNTER — Observation Stay: Admission: EM | Admit: 2023-12-09 | Discharge: 2023-12-11 | Disposition: A | Discharge status: Home / Self Care

## 2023-12-09 ENCOUNTER — Emergency Department

## 2023-12-09 ENCOUNTER — Other Ambulatory Visit: Payer: Self-pay

## 2023-12-09 DIAGNOSIS — Z794 Long term (current) use of insulin: Secondary | ICD-10-CM | POA: Diagnosis not present

## 2023-12-09 DIAGNOSIS — Z79899 Other long term (current) drug therapy: Secondary | ICD-10-CM | POA: Insufficient documentation

## 2023-12-09 DIAGNOSIS — F1190 Opioid use, unspecified, uncomplicated: Secondary | ICD-10-CM | POA: Diagnosis present

## 2023-12-09 DIAGNOSIS — E119 Type 2 diabetes mellitus without complications: Secondary | ICD-10-CM

## 2023-12-09 DIAGNOSIS — G4733 Obstructive sleep apnea (adult) (pediatric): Secondary | ICD-10-CM | POA: Diagnosis not present

## 2023-12-09 DIAGNOSIS — Z7982 Long term (current) use of aspirin: Secondary | ICD-10-CM | POA: Diagnosis not present

## 2023-12-09 DIAGNOSIS — G894 Chronic pain syndrome: Secondary | ICD-10-CM | POA: Diagnosis not present

## 2023-12-09 DIAGNOSIS — R55 Syncope and collapse: Principal | ICD-10-CM | POA: Diagnosis present

## 2023-12-09 DIAGNOSIS — I214 Non-ST elevation (NSTEMI) myocardial infarction: Secondary | ICD-10-CM | POA: Diagnosis not present

## 2023-12-09 DIAGNOSIS — I2510 Atherosclerotic heart disease of native coronary artery without angina pectoris: Secondary | ICD-10-CM | POA: Diagnosis not present

## 2023-12-09 DIAGNOSIS — R001 Bradycardia, unspecified: Secondary | ICD-10-CM | POA: Insufficient documentation

## 2023-12-09 DIAGNOSIS — W19XXXA Unspecified fall, initial encounter: Secondary | ICD-10-CM | POA: Diagnosis not present

## 2023-12-09 DIAGNOSIS — S0003XA Contusion of scalp, initial encounter: Secondary | ICD-10-CM

## 2023-12-09 DIAGNOSIS — N179 Acute kidney failure, unspecified: Secondary | ICD-10-CM

## 2023-12-09 DIAGNOSIS — I700 Atherosclerosis of aorta: Secondary | ICD-10-CM

## 2023-12-09 DIAGNOSIS — I959 Hypotension, unspecified: Secondary | ICD-10-CM | POA: Diagnosis present

## 2023-12-09 DIAGNOSIS — E782 Mixed hyperlipidemia: Secondary | ICD-10-CM | POA: Diagnosis not present

## 2023-12-09 DIAGNOSIS — R918 Other nonspecific abnormal finding of lung field: Secondary | ICD-10-CM | POA: Insufficient documentation

## 2023-12-09 DIAGNOSIS — I10 Essential (primary) hypertension: Secondary | ICD-10-CM | POA: Diagnosis present

## 2023-12-09 LAB — COMPREHENSIVE METABOLIC PANEL WITH GFR
ALT: 19 U/L (ref 0–44)
AST: 26 U/L (ref 15–41)
Albumin: 3.1 g/dL — ABNORMAL LOW (ref 3.5–5.0)
Alkaline Phosphatase: 68 U/L (ref 38–126)
Anion gap: 11 (ref 5–15)
BUN: 27 mg/dL — ABNORMAL HIGH (ref 8–23)
CO2: 25 mmol/L (ref 22–32)
Calcium: 7.9 mg/dL — ABNORMAL LOW (ref 8.9–10.3)
Chloride: 103 mmol/L (ref 98–111)
Creatinine, Ser: 1.72 mg/dL — ABNORMAL HIGH (ref 0.61–1.24)
GFR, Estimated: 40 mL/min — ABNORMAL LOW (ref >60)
Glucose, Bld: 185 mg/dL — ABNORMAL HIGH (ref 70–99)
Potassium: 3.8 mmol/L (ref 3.5–5.1)
Sodium: 139 mmol/L (ref 135–145)
Total Bilirubin: 0.6 mg/dL (ref 0.0–1.2)
Total Protein: 7.1 g/dL (ref 6.5–8.1)

## 2023-12-09 LAB — CBC
HCT: 42.4 % (ref 39.0–52.0)
Hemoglobin: 13.2 g/dL (ref 13.0–17.0)
MCH: 28.7 pg (ref 26.0–34.0)
MCHC: 31.1 g/dL (ref 30.0–36.0)
MCV: 92.2 fL (ref 80.0–100.0)
Platelets: 200 K/uL (ref 150–400)
RBC: 4.6 MIL/uL (ref 4.22–5.81)
RDW: 14.1 % (ref 11.5–15.5)
WBC: 12.7 K/uL — ABNORMAL HIGH (ref 4.0–10.5)
nRBC: 0 % (ref 0.0–0.2)

## 2023-12-09 LAB — URINALYSIS, ROUTINE W REFLEX MICROSCOPIC
Bacteria, UA: NONE SEEN
Bilirubin Urine: NEGATIVE
Glucose, UA: >=500 mg/dL — AB
Hgb urine dipstick: NEGATIVE
Ketones, ur: NEGATIVE mg/dL
Leukocytes,Ua: NEGATIVE
Nitrite: NEGATIVE
Protein, ur: NEGATIVE mg/dL
Specific Gravity, Urine: 1.022 (ref 1.005–1.030)
pH: 5 (ref 5.0–8.0)

## 2023-12-09 LAB — APTT: aPTT: 28 s (ref 24–36)

## 2023-12-09 LAB — TROPONIN I (HIGH SENSITIVITY)
Troponin I (High Sensitivity): 201 ng/L (ref <18)
Troponin I (High Sensitivity): 368 ng/L (ref <18)

## 2023-12-09 LAB — CK: Total CK: 173 U/L (ref 49–397)

## 2023-12-09 LAB — PROTIME-INR
INR: 1 (ref 0.8–1.2)
Prothrombin Time: 13.9 s (ref 11.4–15.2)

## 2023-12-09 MED ORDER — HEPARIN BOLUS VIA INFUSION
4000.0000 [IU] | Freq: Once | INTRAVENOUS | Status: AC
  Administered 2023-12-09: 4000 [IU] via INTRAVENOUS
  Filled 2023-12-09: qty 4000

## 2023-12-09 MED ORDER — ESCITALOPRAM OXALATE 10 MG PO TABS
20.0000 mg | ORAL_TABLET | Freq: Every day | ORAL | Status: DC
  Administered 2023-12-09 – 2023-12-11 (×3): 20 mg via ORAL
  Filled 2023-12-09 (×3): qty 2

## 2023-12-09 MED ORDER — NITROGLYCERIN 0.4 MG SL SUBL: 0.4000 mg | SUBLINGUAL_TABLET | SUBLINGUAL | Status: DC | PRN

## 2023-12-09 MED ORDER — DIPHENHYDRAMINE HCL 25 MG PO CAPS
50.0000 mg | ORAL_CAPSULE | Freq: Once | ORAL | Status: AC
  Administered 2023-12-09: 50 mg via ORAL
  Filled 2023-12-09: qty 2

## 2023-12-09 MED ORDER — ROSUVASTATIN CALCIUM 20 MG PO TABS
40.0000 mg | ORAL_TABLET | Freq: Every day | ORAL | Status: DC
  Administered 2023-12-10 – 2023-12-11 (×2): 40 mg via ORAL
  Filled 2023-12-09: qty 4
  Filled 2023-12-09: qty 2
  Filled 2023-12-09: qty 4
  Filled 2023-12-09: qty 2

## 2023-12-09 MED ORDER — HEPARIN (PORCINE) 25000 UT/250ML-% IV SOLN
1300.0000 [IU]/h | INTRAVENOUS | Status: DC
  Administered 2023-12-09: 1300 [IU]/h via INTRAVENOUS
  Filled 2023-12-09: qty 250

## 2023-12-09 MED ORDER — INSULIN GLARGINE-YFGN 100 UNIT/ML ~~LOC~~ SOLN
8.0000 [IU] | Freq: Every day | SUBCUTANEOUS | Status: DC
  Administered 2023-12-09 – 2023-12-10 (×2): 8 [IU] via SUBCUTANEOUS
  Filled 2023-12-09 (×3): qty 0.08

## 2023-12-09 MED ORDER — DIPHENHYDRAMINE HCL 50 MG/ML IJ SOLN: 50.0000 mg | Freq: Once | INTRAMUSCULAR | Status: AC

## 2023-12-09 MED ORDER — METHOCARBAMOL 500 MG PO TABS
500.0000 mg | ORAL_TABLET | Freq: Two times a day (BID) | ORAL | Status: DC
  Administered 2023-12-09 – 2023-12-11 (×4): 500 mg via ORAL
  Filled 2023-12-09 (×4): qty 1

## 2023-12-09 MED ORDER — PANTOPRAZOLE SODIUM 40 MG PO TBEC: 40.0000 mg | DELAYED_RELEASE_TABLET | Freq: Every day | ORAL | Status: DC | PRN

## 2023-12-09 MED ORDER — SODIUM CHLORIDE 0.9 % IV SOLN: INTRAVENOUS | Status: AC

## 2023-12-09 MED ORDER — ASPIRIN 325 MG PO TABS
325.0000 mg | ORAL_TABLET | Freq: Once | ORAL | Status: AC
  Administered 2023-12-09: 325 mg via ORAL
  Filled 2023-12-09: qty 1

## 2023-12-09 MED ORDER — ACETAMINOPHEN 325 MG PO TABS
650.0000 mg | ORAL_TABLET | ORAL | Status: DC | PRN
  Administered 2023-12-10 – 2023-12-11 (×2): 650 mg via ORAL
  Filled 2023-12-09 (×2): qty 2

## 2023-12-09 MED ORDER — MORPHINE SULFATE ER 15 MG PO TBCR
15.0000 mg | EXTENDED_RELEASE_TABLET | Freq: Three times a day (TID) | ORAL | Status: DC
  Administered 2023-12-09 – 2023-12-11 (×6): 15 mg via ORAL
  Filled 2023-12-09 (×6): qty 1

## 2023-12-09 MED ORDER — SODIUM CHLORIDE 0.9 % IV BOLUS
1000.0000 mL | Freq: Once | INTRAVENOUS | Status: AC
  Administered 2023-12-09: 1000 mL via INTRAVENOUS

## 2023-12-09 MED ORDER — TAMSULOSIN HCL 0.4 MG PO CAPS
0.8000 mg | ORAL_CAPSULE | Freq: Every day | ORAL | Status: DC
  Administered 2023-12-10 – 2023-12-11 (×2): 0.8 mg via ORAL
  Filled 2023-12-09 (×2): qty 2

## 2023-12-09 MED ORDER — BUPROPION HCL ER (XL) 150 MG PO TB24
300.0000 mg | ORAL_TABLET | Freq: Every day | ORAL | Status: DC
  Administered 2023-12-09 – 2023-12-11 (×3): 300 mg via ORAL
  Filled 2023-12-09 (×3): qty 2

## 2023-12-09 MED ORDER — ASPIRIN 81 MG PO TBEC
81.0000 mg | DELAYED_RELEASE_TABLET | Freq: Every day | ORAL | Status: DC
  Administered 2023-12-09 – 2023-12-11 (×3): 81 mg via ORAL
  Filled 2023-12-09 (×3): qty 1

## 2023-12-09 MED ORDER — CARVEDILOL 25 MG PO TABS
25.0000 mg | ORAL_TABLET | Freq: Two times a day (BID) | ORAL | Status: DC
Start: 2023-12-10 — End: 2023-12-11
  Administered 2023-12-10 – 2023-12-11 (×3): 25 mg via ORAL
  Filled 2023-12-09 (×3): qty 1

## 2023-12-09 MED ORDER — EMPAGLIFLOZIN 25 MG PO TABS
25.0000 mg | ORAL_TABLET | Freq: Every day | ORAL | Status: DC
  Administered 2023-12-10 – 2023-12-11 (×2): 25 mg via ORAL
  Filled 2023-12-09 (×2): qty 1

## 2023-12-09 MED ORDER — METHYLPREDNISOLONE SODIUM SUCC 40 MG IJ SOLR
40.0000 mg | Freq: Once | INTRAMUSCULAR | Status: AC
  Administered 2023-12-09: 40 mg via INTRAVENOUS
  Filled 2023-12-09: qty 1

## 2023-12-09 MED ORDER — GABAPENTIN 300 MG PO CAPS
300.0000 mg | ORAL_CAPSULE | Freq: Every day | ORAL | Status: DC
  Administered 2023-12-09 – 2023-12-10 (×2): 300 mg via ORAL
  Filled 2023-12-09 (×2): qty 1

## 2023-12-09 MED ORDER — ONDANSETRON HCL 4 MG/2ML IJ SOLN
4.0000 mg | Freq: Four times a day (QID) | INTRAMUSCULAR | Status: DC | PRN
Start: 2023-12-09 — End: 2023-12-11

## 2023-12-09 NOTE — Assessment & Plan Note (Signed)
Holding antihypertensives due to hypotension. 

## 2023-12-09 NOTE — Assessment & Plan Note (Addendum)
 Creatinine 1.72 up from baseline of 0.86 about 4 months prior Suspect ATN secondary to renal hypoperfusion from hypotension and possible dehydration Continue IV fluids Monitor renal function and avoid nephrotoxins

## 2023-12-09 NOTE — Assessment & Plan Note (Signed)
 Sliding scale insulin  coverage

## 2023-12-09 NOTE — H&P (Signed)
 History and Physical    Patient: Alan Schmidt FMW:[id number] DOB: October 21, 1945 DOA: 12/09/2023 DOS: the patient was seen and examined on 12/09/2023 PCP: [name], MD  Patient coming from: Home  Chief Complaint: syncope  HPI: Alan Schmidt is a 78 y.o. male with medical history significant for HTN, DM, chronic musculoskeletal pain on chronic opiates, history of atypical chest pain (April 2025) evaluated by cardiology with echo, f lower extremity venous stasis,  being admitted with a syncopal episode, possible NSTEMI, possible PE (pending CTA chest).   Patient was in his usual state of health and was walking in the kitchen when he started feeling lightheaded and then awoke on the floor, where he was found by his home health aide.  He apparently hit the back of his head.  He denies chest pain, shortness of breath or palpitations.  Has ongoing lower extremity edema.  BP with EMS was 90/58 with pulse 98 and O2 sats 98% on room air.  He received 500 mL NS en route. On arrival to the ED, mildly tachypneic at 102 with otherwise normal vitals. Labs notable for the following: Troponin 201-> 368 CK 173 Leukocytosis of 12.7 with otherwise normal CBC Creatinine 1.72 up from baseline of 0.86 about 4 months prior CT head showed a right parieto-occipital subcutaneous soft tissue hematoma CT C-spine nonacute CT PE protocol pending due to premedication for contrast allergy  Patient treated with 2 L NS bolus Heparin infusion started ACS protocol Aspirin administered Admission requested     Past Medical History:  Diagnosis Date   Acute postoperative pain 11/24/2016   Anxiety    Chronic hip pain (Right) 12/05/2014   Chronic lumbar pain    Depression    Diabetes mellitus without complication (HCC)    Hyperlipidemia    Hypertension    Kidney stones    Migraines    Past Surgical History:  Procedure Laterality Date   right hip surgery     4 surgeries   TONSILLECTOMY     TOTAL HIP  ARTHROPLASTY     Social History:  reports that he has quit smoking. His smoking use included cigarettes. He has never used smokeless tobacco. He reports that he does not drink alcohol and does not use drugs.  Allergies  Allergen Reactions   Contrast Media [Iodinated Contrast Media] Swelling   Iodine Swelling   Shellfish Allergy Nausea And Vomiting and Swelling    Family History  Problem Relation Age of Onset   Cancer Mother    Heart disease Father    Stroke Father    Diabetes Father    Diabetes Sister    Diabetes Sister    Mental illness Neg Hx     Prior to Admission medications   Medication Sig Start Date End Date Taking? Authorizing Provider  acetaminophen  (TYLENOL ) 500 MG tablet Take 500 mg by mouth every 6 (six) hours as needed.   Yes [provider]  ALPRAZolam  (XANAX ) 1 MG tablet TAKE 1/2 TABLET BY MOUTH 2 TIMES DAILY AS NEEDED FOR ANXIETY. 11/30/23  Yes [name], MD  aspirin EC 81 MG tablet Take 81 mg by mouth daily.   Yes [provider]  buPROPion  (WELLBUTRIN  XL) 150 MG 24 hr tablet TAKE 1 TABLET (150 MG TOTAL) BY MOUTH DAILY FOR 7 DAYS, THEN 2 TABLETS (300 MG TOTAL) DAILY. 12/28/22  Yes Maribeth Camellia MATSU, MD  carvedilol  (COREG ) 25 MG tablet Take 1 tablet (25 mg total) by mouth 2 (two) times daily.  08/05/23  Yes Tejan-Sie, GORMAN Dine, MD  desoximetasone  (TOPICORT ) 0.25 % cream APPLY CREAM TO AFFECTED AREA TWO TIMES DAILY, FOR UP TO 7 DAYS, DO NOT APPLY TO FACE   Strength: 0.25 % 07/23/22  Yes Maribeth Camellia MATSU, MD  empagliflozin  (JARDIANCE ) 25 MG TABS tablet Take 1 tablet by mouth daily. 11/22/23  Yes [name], MD  EPINEPHrine  (EPIPEN  2-PAK) 0.3 mg/0.3 mL IJ SOAJ injection Inject 0.3 mg into the muscle as needed for anaphylaxis. 05/14/20  Yes Maribeth Camellia MATSU, MD  escitalopram  (LEXAPRO ) 20 MG tablet Take 1 tablet by mouth daily. 11/22/23  Yes [name], MD  gabapentin  (NEURONTIN ) 250 MG/5ML solution Take 6 ml by mouth at bedtime.  11/04/21  Yes Maribeth Camellia MATSU, MD  hydrochlorothiazide  (HYDRODIURIL ) 25 MG tablet Take 1 tablet by mouth daily. 11/22/23  Yes [name], MD  insulin  lispro (HUMALOG  KWIKPEN) 100 UNIT/ML KwikPen INJECT 5 TO 7 UNITS UNDER THE SKIN THREE TIMES DAILY WITH MEALS 12/28/22  Yes Maribeth Camellia MATSU, MD  LANTUS SOLOSTAR 100 UNIT/ML Solostar Pen Inject 8-13 Units into the skin daily. 03/31/23  Yes [provider]  meloxicam  (MOBIC ) 15 MG tablet Take 1 tablet (15 mg total) by mouth daily. 12/03/23 02/01/24 Yes [name], MD  metFORMIN  (GLUCOPHAGE ) 1000 MG tablet Take 1,000 mg by mouth 2 (two) times daily. 09/15/23  Yes [provider]  methocarbamol (ROBAXIN) 500 MG tablet Take 500 mg by mouth 2 (two) times daily. 09/10/20  Yes [provider]  mometasone  (NASONEX ) 50 MCG/ACT nasal spray Instill 2 sprays into each nostril once daily. 11/12/23  Yes [name], MD  morphine  (MS CONTIN ) 15 MG 12 hr tablet Take 1 tablet (15 mg total) by mouth every 8 (eight) hours. Must last 30 days. Do not break tablet 11/19/23 12/19/23 Yes Patel, Seema K, NP  MOUNJARO  7.5 MG/0.5ML Pen Inject 7.5 mg into the skin once a week. Sunday 11/06/23  Yes [provider]  naloxone  (NARCAN ) nasal spray 4 mg/0.1 mL Place 1 spray into the nose as needed for up to 2 doses (for opioid-induced respiratory depresssion). In case of emergency (overdose), spray once into each nostril. If no response within 3 minutes, repeat application and call 911. 08/18/23  Yes Tejan-Sie, GORMAN Dine, MD  olmesartan  (BENICAR ) 40 MG tablet Take 1 tablet by mouth daily. 11/22/23  Yes [name], MD  pantoprazole  (PROTONIX ) 40 MG tablet Take 1 tablet by mouth daily. Patient taking differently: Take 40 mg by mouth daily as needed (heartburn). 11/22/23  Yes [name], MD  rosuvastatin  (CRESTOR ) 40 MG tablet Take 1 tablet (40 mg total) by mouth daily. 08/05/23  Yes [name], MD   sennosides-docusate sodium (SENOKOT-S) 8.6-50 MG tablet Take 1 tablet by mouth daily as needed.    Yes [provider]  tamsulosin  (FLOMAX ) 0.4 MG CAPS capsule Take 2 capsules by mouth daily after breakfast. 11/22/23  Yes Tejan-Sie, GORMAN Dine, MD  TRESIBA  FLEXTOUCH 100 UNIT/ML FlexTouch Pen Inject 13 Units into the skin daily. 09/01/23  Yes [provider]  ACCU-CHEK GUIDE test strip USE AS INSTRUCTED DX CODE: E11.9 01/29/22   Maribeth Camellia MATSU, MD  Continuous Blood Gluc Receiver (FREESTYLE LIBRE 14 DAY READER) DEVI 1 Device by Does not apply route daily. Use to scan to check blood sugar up to 7 times daily; E11.42, 05/17/18   Maribeth Camellia MATSU, MD  Continuous Glucose Sensor (FREESTYLE LIBRE 14 DAY SENSOR) MISC 1 Device by  Does not apply route every 14 (fourteen) days. E11.42 06/16/23   Hope Merle, MD  glucose blood (ACCU-CHEK GUIDE TEST) test strip Use as instructed 07/05/23   Hope Merle, MD  Insulin  Pen Needle (BD PEN NEEDLE NANO U/F) 32G X 4 MM MISC USE EVERY DAY 05/15/19   Maribeth Camellia MATSU, MD  Insulin  Pen Needle (PEN NEEDLES) 32G X 4 MM MISC Use with insulin  for daily injections 11/30/23   Tejan-Sie, S Ahmed, MD  loratadine  (CLARITIN ) 10 MG tablet Take 1 tablet (10 mg total) by mouth daily. 08/18/23 11/16/23  [name], MD  lubiprostone  (AMITIZA ) 8 MCG capsule TAKE 1 CAPSULE (8 MCG TOTAL) BY MOUTH 2 (TWO) TIMES DAILY WITH A MEAL. SWALLOW THE MEDICATION WHOLE. DO NOT BREAK OR CHEW THE MEDICATION. 04/10/22 11/05/23  Webb, Padonda B, FNP  metFORMIN  (GLUCOPHAGE ) 500 MG tablet Take 1 tablet (500 mg total) by mouth 2 (two) times daily with a meal. Patient not taking: Reported on 12/09/2023 09/20/23 12/19/23  [name], MD  morphine  (MS CONTIN ) 15 MG 12 hr tablet Take 1 tablet (15 mg total) by mouth every 8 (eight) hours. Must last 30 days. Do not break tablet 09/20/23 11/05/23  Patel, Seema K, NP  morphine  (MS CONTIN ) 15 MG 12 hr tablet Take 1 tablet (15 mg total) by mouth  every 8 (eight) hours. Must last 30 days. Do not break tablet 10/20/23 11/19/23  Patel, Seema K, NP  Wheat Dextrin (BENEFIBER) POWD Take 6 g by mouth 3 (three) times daily before meals. (2 tsp = 6 g) 12/14/19 11/05/23  Tanya Glisson, MD    Physical Exam: Vitals:   12/09/23 1508 12/09/23 1816 12/09/23 1900 12/09/23 2048  BP: 130/70 128/81 115/63   Pulse: (!) 102 86 86   Resp: 16 20    Temp: 98.3 F (36.8 C) (!) 97.4 F (36.3 C)    TempSrc: Oral Oral    SpO2: 100% 100% 100%   Weight:    115.3 kg  Height:    6' 1 (1.854 m)   Physical Exam  Labs on Admission: I have personally reviewed following labs and imaging studies  CBC: Recent Labs  Lab 12/09/23 1510  WBC 12.7*  HGB 13.2  HCT 42.4  MCV 92.2  PLT 200   Basic Metabolic Panel: Recent Labs  Lab 12/09/23 1510  NA 139  K 3.8  CL 103  CO2 25  GLUCOSE 185*  BUN 27*  CREATININE 1.72*  CALCIUM  7.9*   GFR: Estimated Creatinine Clearance: 47.9 mL/min (A) (by C-G formula based on SCr of 1.72 mg/dL (H)). Liver Function Tests: Recent Labs  Lab 12/09/23 1510  AST 26  ALT 19  ALKPHOS 68  BILITOT 0.6  PROT 7.1  ALBUMIN 3.1*   No results for input(s): LIPASE, AMYLASE in the last 168 hours. No results for input(s): AMMONIA in the last 168 hours. Coagulation Profile: Recent Labs  Lab 12/09/23 2106  INR 1.0   Cardiac Enzymes: Recent Labs  Lab 12/09/23 1510  CKTOTAL 173   BNP (last 3 results) No results for input(s): PROBNP in the last 8760 hours. HbA1C: No results for input(s): HGBA1C in the last 72 hours. CBG: No results for input(s): GLUCAP in the last 168 hours. Lipid Profile: No results for input(s): CHOL, HDL, LDLCALC, TRIG, CHOLHDL, LDLDIRECT in the last 72 hours. Thyroid  Function Tests: No results for input(s): TSH, T4TOTAL, FREET4, T3FREE, THYROIDAB in the last 72 hours. Anemia Panel: No results for input(s): VITAMINB12, FOLATE, FERRITIN,  TIBC,  IRON, RETICCTPCT in the last 72 hours. Urine analysis:    Component Value Date/Time   COLORURINE YELLOW (A) 12/09/2023 1940   APPEARANCEUR CLEAR (A) 12/09/2023 1940   APPEARANCEUR Clear 05/30/2020 1547   LABSPEC 1.022 12/09/2023 1940   PHURINE 5.0 12/09/2023 1940   GLUCOSEU >=500 (A) 12/09/2023 1940   HGBUR NEGATIVE 12/09/2023 1940   BILIRUBINUR NEGATIVE 12/09/2023 1940   BILIRUBINUR Negative 05/30/2020 1547   KETONESUR NEGATIVE 12/09/2023 1940   PROTEINUR NEGATIVE 12/09/2023 1940   UROBILINOGEN 1.0 05/15/2020 0832   NITRITE NEGATIVE 12/09/2023 1940   LEUKOCYTESUR NEGATIVE 12/09/2023 1940    Radiological Exams on Admission: CT Cervical Spine Wo Contrast Result Date: 12/09/2023 EXAM: CT CERVICAL SPINE WITHOUT CONTRAST 12/09/2023 08:39:45 PM TECHNIQUE: CT of the cervical spine was performed without the administration of intravenous contrast. Multiplanar reformatted images are provided for review. Automated exposure control, iterative reconstruction, and/or weight based adjustment of the mA/kV was utilized to reduce the radiation dose to as low as reasonably achievable. COMPARISON: None available. CLINICAL HISTORY: Neck trauma (Age >= 65y). FINDINGS: CERVICAL SPINE: BONES AND ALIGNMENT: No acute fracture or traumatic malalignment. DEGENERATIVE CHANGES: Multilevel spondylosis. Partial ankylosis of the C4-C6 vertebral bodies. No severe spinal canal narrowing. SOFT TISSUES: No prevertebral soft tissue swelling. IMPRESSION: 1. No acute abnormality of the cervical spine. Electronically signed by: Norman Gatlin MD 12/09/2023 08:44 PM EST RP Workstation: [id number]   CT Head Wo Contrast Result Date: 12/09/2023 EXAM: CT HEAD WITHOUT CONTRAST 12/09/2023 08:39:45 PM TECHNIQUE: CT of the head was performed without the administration of intravenous contrast. Automated exposure control, iterative reconstruction, and/or weight based adjustment of the mA/kV was utilized to reduce the radiation dose to as  low as reasonably achievable. COMPARISON: None available. CLINICAL HISTORY: Head trauma, minor (Age >= 65y). FINDINGS: BRAIN AND VENTRICLES: No acute hemorrhage. No evidence of acute infarct. No hydrocephalus. No extra-axial collection. No mass effect or midline shift. Patchy and confluent decreased attenuation throughout deep and periventricular white matter of cerebral hemispheres bilaterally, compatible with chronic microvascular ischemic disease. ORBITS: No acute abnormality. Bilateral lens replacement. SINUSES: No acute abnormality. SOFT TISSUES AND SKULL: No acute soft tissue abnormality. No skull fracture. Right parieto-occipital subcutaneous soft tissue hematoma. IMPRESSION: 1. No acute intracranial abnormality. 2. Right parieto-occipital subcutaneous soft tissue hematoma. Electronically signed by: Norman Gatlin MD 12/09/2023 08:42 PM EST RP Workstation: [id number]   Data Reviewed for HPI: Relevant notes from primary care and specialist visits, past discharge summaries as available in EHR, including Care Everywhere. Prior diagnostic testing as pertinent to current admission diagnoses Updated medications and problem lists for reconciliation ED course, including vitals, labs, imaging, treatment and response to treatment Triage notes, nursing and pharmacy notes and ED provider's notes Notable results as noted above in HPI      Assessment and Plan: * Syncope and collapse Broad differential: NSTEMI, possible PE, dehydration/orthostatic hypotension/polypharmacy Patient on multiple sedating meds including MS Contin , gabapentin , Robaxin, Xanax ,--- Will modify doses if able Hypotensive on arrival, likely related to dehydration Neurologic checks and fall precautions Continuous cardiac monitoring Orthostatics Repeating echo to evaluate for WMA.  (05/2023: EF 60 to 65% G1 DD and no valvulopathy) Carotid Doppler Follow-up CTA chest to evaluate for PE-patient currently being premedicated Treat  each potential etiology as seen on the respective problems Patient is on  NSTEMI (non-ST elevated myocardial infarction) (HCC) History of atypical chest pain 05/2023 Troponin 201-> 368 Denies chest pain or shortness of breath.  EKG nonacute Continue heparin infusion, ACS protocol  Continue aspirin, carvedilol  and rosuvastatin  Echo to evaluate for segmental wall motion abnormality  AKI (acute kidney injury) Creatinine 1.72 up from baseline of 0.86 about 4 months prior Suspect ATN secondary to renal hypoperfusion from hypotension and possible dehydration Continue IV fluids Monitor renal function and avoid nephrotoxins  Hypotension History of hypertension Possibly related to multiple antihypertensives(HCTZ, olmesartan , carvedilol ), with decreased oral intake following fall BP 90/58 with EMS improving with bolus with EMS and additional boluses in the ED Will continue IV fluid resuscitation Cautiously continue carvedilol  Hold hydrochlorothiazide  and olmesartan   Scalp hematoma/contusion, right parieto-occipital Secondary to fall CT head with no evidence of intracranial injury Pain control Ice packs as needed  Chronic pain syndrome Chronic opiate use Patient on MS Contin  15 mg 3 times daily, Robaxin 500 twice daily, Xanax  and gabapentin  Continue pain meds.  Will hold if altered  Diabetes mellitus, type II (HCC) Sliding scale insulin  coverage  Obstructive sleep apnea CPAP if desired  Essential hypertension Holding antihypertensives due to hypotension   DVT prophylaxis: Heparin infusion  Consults: none  Advance Care Planning: full code  Family Communication: none  Disposition Plan: Back to previous home environment  Severity of Illness: The appropriate patient status for this patient is OBSERVATION. Observation status is judged to be reasonable and necessary in order to provide the required intensity of service to ensure the patient's safety. The patient's presenting  symptoms, physical exam findings, and initial radiographic and laboratory data in the context of their medical condition is felt to place them at decreased risk for further clinical deterioration. Furthermore, it is anticipated that the patient will be medically stable for discharge from the hospital within 2 midnights of admission.   Author: Delayne LULLA Solian, MD 12/09/2023 9:48 PM  For on call review [email or web address].

## 2023-12-09 NOTE — ED Triage Notes (Signed)
 Arrives from home via [facility]. Patient had a possible syncopal episode, home health aid found patient on floor. Patient describes feeling dizzy while walking into kitchen for water.  90/58, 102/60 98 98%  18g left hand -- 500cc NS

## 2023-12-09 NOTE — Assessment & Plan Note (Signed)
 History of atypical chest pain 05/2023 Troponin 201-> 368 Denies chest pain or shortness of breath.  EKG nonacute Continue heparin infusion, ACS protocol Continue aspirin, carvedilol  and rosuvastatin  Echo to evaluate for segmental wall motion abnormality

## 2023-12-09 NOTE — Assessment & Plan Note (Addendum)
 Secondary to fall CT head with no evidence of intracranial injury Pain control Ice packs as needed

## 2023-12-09 NOTE — ED Notes (Signed)
 Trop 201 MD Siadecki informed and acuity changed due to lab result

## 2023-12-09 NOTE — Consult Note (Signed)
 PHARMACY - ANTICOAGULATION CONSULT NOTE  Pharmacy Consult for Heparin Infusion Indication: chest pain/ACS  Allergies  Allergen Reactions   Contrast Media [Iodinated Contrast Media] Swelling   Iodine Swelling   Shellfish Allergy Nausea And Vomiting and Swelling   Patient Measurements: Height: (P) 6' 1 (185.4 cm) Weight: 115.3 kg (254 lb 3.1 oz) IBW/kg (Calculated) : (P) 79.9 HEPARIN DW (KG): (P) 104.5  Vital Signs: Temp: 97.4 F (36.3 C) (11/06 1816) Temp Source: Oral (11/06 1816) BP: 115/63 (11/06 1900) Pulse Rate: 86 (11/06 1900)  Labs: Recent Labs    12/09/23 1510 12/09/23 1942  HGB 13.2  --   HCT 42.4  --   PLT 200  --   CREATININE 1.72*  --   CKTOTAL 173  --   TROPONINIHS 201* 368*   Estimated Creatinine Clearance: 47.9 mL/min (A) (by C-G formula based on SCr of 1.72 mg/dL (H)).  Medical History: Past Medical History:  Diagnosis Date   Acute postoperative pain 11/24/2016   Anxiety    Chronic hip pain (Right) 12/05/2014   Chronic lumbar pain    Depression    Diabetes mellitus without complication (HCC)    Hyperlipidemia    Hypertension    Kidney stones    Migraines     Medications:  NO AC prior to admission per med list and fill history Baseline CBC appropriate to start therapy. Baseline INR and aPTT ordered.  Assessment: 78yo male admitted to ER with chest pain. PMH includes poorly controlled DM, neuropathy, HTN and hyperlipidemia. Troponin elevated at 368. Pharmacy consulted to initiate and manage heparin infusion.   Goal of Therapy:  Heparin level 0.3-0.7 units/ml Monitor platelets by anticoagulation protocol: Yes   Plan:  Give 4000 units bolus x 1 Start heparin infusion at 1300 units/hr Check heparin level in 8 hours and daily while on heparin Continue to monitor H&H and platelets  Anays Detore Rodriguez-Guzman PharmD, BCPS 12/09/2023 9:05 PM

## 2023-12-09 NOTE — Hospital Course (Signed)
 Alan Schmidt

## 2023-12-09 NOTE — ED Provider Notes (Signed)
 [facility] Provider Note    Event Date/Time   First MD Initiated Contact with Patient 12/09/23 1818     (approximate)   History   No chief complaint on file.   HPI  ICHAEL PULLARA is a 78 year old male with history of T2DM, HTN presenting to the ER for evaluation following a syncopal episode.  Patient was walking around in his kitchen when he began to feel dizzy tried to sit down and ended up waking up on the floor.  He reports that he was unable to get up by himself but this is not atypical for him.  His home health aide came to check on him tenderness later.  Patient did note swelling to the back of his head.  Denies injury to other areas.  Denies history of similar.  Denies chest pain or shortness of breath.  Reviewed PCP visit from 10/3, ongoing management for T2DM, HTN, chronic pain at time.       Physical Exam   Triage Vital Signs: ED Triage Vitals  Encounter Vitals Group     BP 12/09/23 1508 130/70     Girls Systolic BP Percentile --      Girls Diastolic BP Percentile --      Boys Systolic BP Percentile --      Boys Diastolic BP Percentile --      Pulse Rate 12/09/23 1508 (!) 102     Resp 12/09/23 1508 16     Temp 12/09/23 1508 98.3 F (36.8 C)     Temp Source 12/09/23 1508 Oral     SpO2 12/09/23 1508 100 %     Weight --      Height --      Head Circumference --      Peak Flow --      Pain Score 12/09/23 1816 5     Pain Loc --      Pain Education --      Exclude from Growth Chart --     Most recent vital signs: Vitals:   12/09/23 1900 12/09/23 2230  BP: 115/63 128/62  Pulse: 86 86  Resp:  20  Temp:  97.6 F (36.4 C)  SpO2: 100% 97%     General: Awake, interactive  CV:  Good peripheral perfusion Resp:  Unlabored respirations, lungs clear to auscultation Abd:  Nondistended soft, nontender Neuro:  Symmetric facial movement, fluid speech, 5 out of 5 strength in the bilateral upper and lower extremities Other:   Palpable  hematoma over the right posterior scalp    ED Results / Procedures / Treatments   Labs (all labs ordered are listed, but only abnormal results are displayed) Labs Reviewed  COMPREHENSIVE METABOLIC PANEL WITH GFR - Abnormal; Notable for the following components:      Result Value   Glucose, Bld 185 (*)    BUN 27 (*)    Creatinine, Ser 1.72 (*)    Calcium  7.9 (*)    Albumin 3.1 (*)    GFR, Estimated 40 (*)    All other components within normal limits  CBC - Abnormal; Notable for the following components:   WBC 12.7 (*)    All other components within normal limits  URINALYSIS, ROUTINE W REFLEX MICROSCOPIC - Abnormal; Notable for the following components:   Color, Urine YELLOW (*)    APPearance CLEAR (*)    Glucose, UA >=500 (*)    All other components within normal limits  TROPONIN I (HIGH SENSITIVITY) -  Abnormal; Notable for the following components:   Troponin I (High Sensitivity) 201 (*)    All other components within normal limits  TROPONIN I (HIGH SENSITIVITY) - Abnormal; Notable for the following components:   Troponin I (High Sensitivity) 368 (*)    All other components within normal limits  CK  PROTIME-INR  APTT  HEPARIN LEVEL (UNFRACTIONATED)  CBC  LIPOPROTEIN A (LPA)  BASIC METABOLIC PANEL WITH GFR  CBG MONITORING, ED     EKG EKG independently reviewed and interpreted by myself demonstrates:  EKG demonstrates normal sinus rhythm at a rate of 98, PR 162, QRS 108, QTc 485, nonspecific ST changes noted, no STEMI  RADIOLOGY Imaging independently reviewed and interpreted by myself demonstrates:  CT head without acute bleed CT C-spine without acute fracture  Formal Radiology Read:  CT Cervical Spine Wo Contrast Result Date: 12/09/2023 EXAM: CT CERVICAL SPINE WITHOUT CONTRAST 12/09/2023 08:39:45 PM TECHNIQUE: CT of the cervical spine was performed without the administration of intravenous contrast. Multiplanar reformatted images are provided for review. Automated  exposure control, iterative reconstruction, and/or weight based adjustment of the mA/kV was utilized to reduce the radiation dose to as low as reasonably achievable. COMPARISON: None available. CLINICAL HISTORY: Neck trauma (Age >= 65y). FINDINGS: CERVICAL SPINE: BONES AND ALIGNMENT: No acute fracture or traumatic malalignment. DEGENERATIVE CHANGES: Multilevel spondylosis. Partial ankylosis of the C4-C6 vertebral bodies. No severe spinal canal narrowing. SOFT TISSUES: No prevertebral soft tissue swelling. IMPRESSION: 1. No acute abnormality of the cervical spine. Electronically signed by: Norman Gatlin MD 12/09/2023 08:44 PM EST RP Workstation: [id number]   CT Head Wo Contrast Result Date: 12/09/2023 EXAM: CT HEAD WITHOUT CONTRAST 12/09/2023 08:39:45 PM TECHNIQUE: CT of the head was performed without the administration of intravenous contrast. Automated exposure control, iterative reconstruction, and/or weight based adjustment of the mA/kV was utilized to reduce the radiation dose to as low as reasonably achievable. COMPARISON: None available. CLINICAL HISTORY: Head trauma, minor (Age >= 65y). FINDINGS: BRAIN AND VENTRICLES: No acute hemorrhage. No evidence of acute infarct. No hydrocephalus. No extra-axial collection. No mass effect or midline shift. Patchy and confluent decreased attenuation throughout deep and periventricular white matter of cerebral hemispheres bilaterally, compatible with chronic microvascular ischemic disease. ORBITS: No acute abnormality. Bilateral lens replacement. SINUSES: No acute abnormality. SOFT TISSUES AND SKULL: No acute soft tissue abnormality. No skull fracture. Right parieto-occipital subcutaneous soft tissue hematoma. IMPRESSION: 1. No acute intracranial abnormality. 2. Right parieto-occipital subcutaneous soft tissue hematoma. Electronically signed by: Norman Gatlin MD 12/09/2023 08:42 PM EST RP Workstation: [id number]    PROCEDURES:  Critical Care performed: Yes, see  critical care procedure note(s)  CRITICAL CARE Performed by: Nilsa Dade   Total critical care time: 31 minutes  Critical care time was exclusive of separately billable procedures and treating other patients.  Critical care was necessary to treat or prevent imminent or life-threatening deterioration.  Critical care was time spent personally by me on the following activities: development of treatment plan with patient and/or surrogate as well as nursing, discussions with consultants, evaluation of patient's response to treatment, examination of patient, obtaining history from patient or surrogate, ordering and performing treatments and interventions, ordering and review of laboratory studies, ordering and review of radiographic studies, pulse oximetry and re-evaluation of patient's condition.   Procedures   MEDICATIONS ORDERED IN ED: Medications  heparin ADULT infusion 100 units/mL (25000 units/250mL) (1,300 Units/hr Intravenous New Bag/Given 12/09/23 2114)  aspirin EC tablet 81 mg (81 mg Oral Given 12/09/23 2304)  morphine  (  MS CONTIN ) 12 hr tablet 15 mg (15 mg Oral Given 12/09/23 2302)  carvedilol  (COREG ) tablet 25 mg (has no administration in time range)  rosuvastatin  (CRESTOR ) tablet 40 mg (has no administration in time range)  buPROPion  (WELLBUTRIN  XL) 24 hr tablet 300 mg (300 mg Oral Given 12/09/23 2303)  escitalopram  (LEXAPRO ) tablet 20 mg (20 mg Oral Given 12/09/23 2302)  empagliflozin  (JARDIANCE ) tablet 25 mg (has no administration in time range)  insulin  glargine-yfgn (SEMGLEE) injection 8 Units (8 Units Subcutaneous Given 12/09/23 2305)  pantoprazole  (PROTONIX ) EC tablet 40 mg (has no administration in time range)  tamsulosin  (FLOMAX ) capsule 0.8 mg (has no administration in time range)  methocarbamol (ROBAXIN) tablet 500 mg (500 mg Oral Given 12/09/23 2303)  gabapentin  (NEURONTIN ) capsule 300 mg (300 mg Oral Given 12/09/23 2302)  nitroGLYCERIN (NITROSTAT) SL tablet 0.4 mg (has no  administration in time range)  acetaminophen  (TYLENOL ) tablet 650 mg (has no administration in time range)  ondansetron  (ZOFRAN ) injection 4 mg (has no administration in time range)  0.9 %  sodium chloride  infusion ( Intravenous New Bag/Given 12/09/23 2335)  sodium chloride  0.9 % bolus 1,000 mL (0 mLs Intravenous Stopped 12/09/23 1940)  methylPREDNISolone  sodium succinate (SOLU-MEDROL ) 40 mg/mL injection 40 mg (40 mg Intravenous Given 12/09/23 2022)  diphenhydrAMINE (BENADRYL) capsule 50 mg (50 mg Oral Given 12/09/23 2305)    Or  diphenhydrAMINE (BENADRYL) injection 50 mg ( Intravenous See Alternative 12/09/23 2305)  aspirin tablet 325 mg (325 mg Oral Given 12/09/23 2108)  heparin bolus via infusion 4,000 Units (4,000 Units Intravenous Bolus from Bag 12/09/23 2114)     IMPRESSION / MDM / ASSESSMENT AND PLAN / ED COURSE  I reviewed the triage vital signs and the nursing notes.  Differential diagnosis includes, but is not limited to, arrhythmia, anemia, electrolyte abnormality, ACS, PE, orthostasis, intracranial bleed, skull fracture, no evidence of thoracoabdominal trauma  Patient's presentation is most consistent with acute presentation with potential threat to life or bodily function.  78 year old male presenting to the emergency department for evaluation following a syncopal episode with head trauma.  Labs with mild leukocytosis, reassuring hemoglobin.  CMP demonstrates AKI with creatinine of 1.72, baseline around 0.86.  Ordered for IV fluids.  Normal CK. Initial troponin elevated at 200, repeat did increase to 368.  Patient continues to deny any chest pain or shortness of breath.  EKG with nonspecific changes, no STEMI.  Will obtain repeat with uptrending troponin.  Consideration for PE given syncopal episode with elevated troponin.  Patient unfortunately with contrast allergy.  Ordered for premedications and CTA of the chest ordered.  Did go ahead and order heparin with uptrending troponin for  NSTEMI.  Will reach out to hospitalist team. Clinical Course as of 12/09/23 2349  Thu Dec 09, 2023  2129 Case discussed with hospitalist team.  They will evaluate for anticipated admission. [NR]  2129 Troponin I (High Sensitivity)(!!): 368 [HD]  2131 Troponin I (High Sensitivity)(!!) [HD]    Clinical Course User Index [HD] Cleatus Delayne GAILS, MD [NR] Levander Slate, MD     FINAL CLINICAL IMPRESSION(S) / ED DIAGNOSES   Final diagnoses:  Syncope and collapse  NSTEMI (non-ST elevated myocardial infarction) [facility])     Rx / DC Orders   ED Discharge Orders     None        Note:  This document was prepared using Dragon voice recognition software and may include unintentional dictation errors.   Levander Slate, MD 12/09/23 [phone]

## 2023-12-09 NOTE — Assessment & Plan Note (Signed)
 CPAP if desired

## 2023-12-09 NOTE — Assessment & Plan Note (Addendum)
 History of hypertension Possibly related to multiple antihypertensives(HCTZ, olmesartan , carvedilol ), with decreased oral intake following fall BP 90/58 with EMS improving with bolus with EMS and additional boluses in the ED Will continue IV fluid resuscitation Cautiously continue carvedilol  Hold hydrochlorothiazide  and olmesartan 

## 2023-12-09 NOTE — Assessment & Plan Note (Addendum)
 Differential: NSTEMI, , dehydration/orthostatic hypotension/polypharmacy Patient on multiple sedating meds including MS Contin , gabapentin , Robaxin, Xanax ,--- Will modify doses if able Hypotensive on arrival, likely related to dehydration Neurologic checks and fall precautions Continuous cardiac monitoring Orthostatics Repeating echo to evaluate for WMA.  (05/2023: EF 60 to 65% G1 DD and no valvulopathy) Carotid Doppler Follow-up CTA chest to evaluate for PE-patient currently being premedicated--->negative for PE Treat each potential etiology as seen on the respective problems

## 2023-12-09 NOTE — Assessment & Plan Note (Addendum)
 Chronic opiate use Patient on MS Contin  15 mg 3 times daily, Robaxin 500 twice daily, Xanax  and gabapentin  Continue pain meds.  Will hold if altered

## 2023-12-10 ENCOUNTER — Encounter: Payer: Self-pay | Admitting: Internal Medicine

## 2023-12-10 ENCOUNTER — Observation Stay
Admit: 2023-12-10 | Discharge: 2023-12-10 | Disposition: A | Discharge status: Home / Self Care | Attending: Internal Medicine | Admitting: Internal Medicine

## 2023-12-10 ENCOUNTER — Telehealth: Payer: Self-pay

## 2023-12-10 ENCOUNTER — Telehealth: Payer: Self-pay | Admitting: Cardiology

## 2023-12-10 ENCOUNTER — Observation Stay

## 2023-12-10 ENCOUNTER — Ambulatory Visit: Attending: Medical

## 2023-12-10 DIAGNOSIS — R55 Syncope and collapse: Secondary | ICD-10-CM

## 2023-12-10 DIAGNOSIS — N179 Acute kidney failure, unspecified: Secondary | ICD-10-CM

## 2023-12-10 DIAGNOSIS — I2510 Atherosclerotic heart disease of native coronary artery without angina pectoris: Secondary | ICD-10-CM | POA: Diagnosis not present

## 2023-12-10 DIAGNOSIS — E782 Mixed hyperlipidemia: Secondary | ICD-10-CM

## 2023-12-10 DIAGNOSIS — I214 Non-ST elevation (NSTEMI) myocardial infarction: Secondary | ICD-10-CM | POA: Diagnosis not present

## 2023-12-10 DIAGNOSIS — I700 Atherosclerosis of aorta: Secondary | ICD-10-CM

## 2023-12-10 LAB — CBC
HCT: 43.2 % (ref 39.0–52.0)
Hemoglobin: 13.7 g/dL (ref 13.0–17.0)
MCH: 28.7 pg (ref 26.0–34.0)
MCHC: 31.7 g/dL (ref 30.0–36.0)
MCV: 90.4 fL (ref 80.0–100.0)
Platelets: 213 K/uL (ref 150–400)
RBC: 4.78 MIL/uL (ref 4.22–5.81)
RDW: 14.2 % (ref 11.5–15.5)
WBC: 8.7 K/uL (ref 4.0–10.5)
nRBC: 0 % (ref 0.0–0.2)

## 2023-12-10 LAB — BASIC METABOLIC PANEL WITH GFR
Anion gap: 7 (ref 5–15)
BUN: 24 mg/dL — ABNORMAL HIGH (ref 8–23)
CO2: 26 mmol/L (ref 22–32)
Calcium: 8.4 mg/dL — ABNORMAL LOW (ref 8.9–10.3)
Chloride: 105 mmol/L (ref 98–111)
Creatinine, Ser: 1.13 mg/dL (ref 0.61–1.24)
GFR, Estimated: >60 mL/min (ref >60)
Glucose, Bld: 234 mg/dL — ABNORMAL HIGH (ref 70–99)
Potassium: 4.4 mmol/L (ref 3.5–5.1)
Sodium: 138 mmol/L (ref 135–145)

## 2023-12-10 LAB — ECHOCARDIOGRAM COMPLETE
AR max vel: 4.27 cm2
AV Area VTI: 4.47 cm2
AV Area mean vel: 4.27 cm2
AV Mean grad: 2 mmHg
AV Peak grad: 3 mmHg
Ao pk vel: 0.86 m/s
Area-P 1/2: 3.03 cm2
Height: 73 in
MV VTI: 3.26 cm2
S' Lateral: 2.53 cm
Weight: 3880.1 [oz_av]

## 2023-12-10 LAB — LIPOPROTEIN A (LPA): Lipoprotein (a): 55.2 nmol/L — ABNORMAL HIGH (ref <75.0)

## 2023-12-10 LAB — HEPARIN LEVEL (UNFRACTIONATED): Heparin Unfractionated: 0.59 [IU]/mL (ref 0.30–0.70)

## 2023-12-10 LAB — TROPONIN I (HIGH SENSITIVITY): Troponin I (High Sensitivity): 194 ng/L (ref <18)

## 2023-12-10 MED ORDER — MORPHINE SULFATE ER 15 MG PO TBCR: 15.0000 mg | EXTENDED_RELEASE_TABLET | Freq: Two times a day (BID) | ORAL | Status: DC

## 2023-12-10 MED ORDER — IOHEXOL 350 MG/ML SOLN
80.0000 mL | Freq: Once | INTRAVENOUS | Status: AC | PRN
  Administered 2023-12-10: 80 mL via INTRAVENOUS

## 2023-12-10 MED ORDER — SODIUM CHLORIDE 0.9 % IV SOLN: INTRAVENOUS | Status: AC

## 2023-12-10 MED ORDER — INSULIN ASPART 100 UNIT/ML IJ SOLN
0.0000 [IU] | Freq: Three times a day (TID) | INTRAMUSCULAR | Status: DC
  Administered 2023-12-10: 3 [IU] via SUBCUTANEOUS
  Administered 2023-12-10: 2 [IU] via SUBCUTANEOUS
  Administered 2023-12-11 (×2): 3 [IU] via SUBCUTANEOUS
  Filled 2023-12-10 (×4): qty 1

## 2023-12-10 NOTE — Progress Notes (Signed)
*  PRELIMINARY RESULTS* Echocardiogram 2D Echocardiogram has been performed.  Floydene Harder 12/10/2023, 8:06 AM

## 2023-12-10 NOTE — Plan of Care (Signed)

## 2023-12-10 NOTE — Consult Note (Signed)
 PHARMACY - ANTICOAGULATION CONSULT NOTE  Pharmacy Consult for Heparin Infusion Indication: chest pain/ACS  Allergies  Allergen Reactions   Contrast Media [Iodinated Contrast Media] Swelling   Iodine Swelling   Shellfish Allergy Nausea And Vomiting and Swelling   Patient Measurements: Height: 6' 1 (185.4 cm) Weight: 115.3 kg (254 lb 3.1 oz) IBW/kg (Calculated) : 79.9 HEPARIN DW (KG): 104.5  Vital Signs: Temp: 97.8 F (36.6 C) (11/07 0043) Temp Source: Oral (11/06 1816) BP: 121/67 (11/07 0043) Pulse Rate: 89 (11/07 0043)  Labs: Recent Labs    12/09/23 1510 12/09/23 1942 12/09/23 2106 12/10/23 0324  HGB 13.2  --   --  13.7  HCT 42.4  --   --  43.2  PLT 200  --   --  213  APTT  --   --  28  --   LABPROT  --   --  13.9  --   INR  --   --  1.0  --   HEPARINUNFRC  --   --   --  0.59  CREATININE 1.72*  --   --   --   CKTOTAL 173  --   --   --   TROPONINIHS 201* 368*  --   --    Estimated Creatinine Clearance: 47.9 mL/min (A) (by C-G formula based on SCr of 1.72 mg/dL (H)).  Medical History: Past Medical History:  Diagnosis Date   Acute postoperative pain 11/24/2016   Anxiety    Chronic hip pain (Right) 12/05/2014   Chronic lumbar pain    Depression    Diabetes mellitus without complication (HCC)    Hyperlipidemia    Hypertension    Kidney stones    Migraines     Medications:  NO AC prior to admission per med list and fill history Baseline CBC appropriate to start therapy. Baseline INR and aPTT ordered.  Assessment: 78yo male admitted to ER with chest pain. PMH includes poorly controlled DM, neuropathy, HTN and hyperlipidemia. Troponin elevated at 368. Pharmacy consulted to initiate and manage heparin infusion.   Goal of Therapy:  Heparin level 0.3-0.7 units/ml Monitor platelets by anticoagulation protocol: Yes   Plan:  11/7:  HL @ 0324 = 0.59, therapeutic X 1 - Will continue pt on current rate and recheck HL in 8 hrs.  Continue to monitor H&H and  platelets  Branae Crail D 12/10/2023 4:41 AM

## 2023-12-10 NOTE — Telephone Encounter (Signed)
 Left voicemail, pt needs to schedule hosp follow up appt in 2-3 weeks with Dr. Budd or Cadence [place], [place]

## 2023-12-10 NOTE — Progress Notes (Signed)
 Progress Note   Patient: Alan Schmidt FMW:[id number] DOB: 1945/07/02 DOA: 12/09/2023     0 DOS: the patient was seen and examined on 12/10/2023   Brief hospital course: Pt has been admitted for possible NSTEMI and syncope evaluation  Assessment and Plan: Syncope and collapse likely from hypotension/polypharmacy Per cardiology, less likely cardiac Patient on multiple sedating meds including MS Contin , gabapentin , Robaxin, Xanax ,--- Will modify doses if able Hypotension resolved Fall precautions Neurologic checks Continuous cardiac monitoring Reviewed echo:Left ventricular ejection fraction, by estimation, is 60 to 65%. The left ventricle has normal function. Left ventricular endocardial border not optimally defined to evaluate regional wall motion. There is mild asymmetric left ventricular hypertrophy. Left ventricular diastolic parameters are consistent with Grade I diastolic dysfunction Reviewed CTA chest: No evidence of pulmonary embolism. Diffuse bilateral interstitial thickening which may represent sequelae associated with interstitial edema. 2 mm anterolateral right upper lobe pulmonary nodule. No follow-up needed if patient is low-risk F/u orthostatics in am   Headache: Secondary to subcutaneous hematoma status post fall CT head showed no acute intracranial abnormality. Right parieto-occipital subcutaneous soft tissue hematoma. Pain control  NSTEMI (non-ST elevated myocardial infarction) (HCC) History of atypical chest pain 05/2023 Denies chest pain or shortness of breath.  EKG nonacute Troponin 201-> 368--> 194 Per cardiology, less likely cardiac Discontinued Heparin drip Reviewed echo:Left ventricular ejection fraction, by estimation, is 60 to 65%. The left ventricle has normal function. Left ventricular endocardial border not optimally defined to evaluate regional wall motion. There is mild asymmetric left ventricular hypertrophy. Left ventricular diastolic parameters are  consistent with Grade I diastolic dysfunction Continue aspirin, carvedilol  and rosuvastatin   AKI (acute kidney injury) Creatinine improved from 1.72 to 1.13 Likely from from dehydration and hypotension  Continue IV fluids Monitor renal function and avoid nephrotoxins  Hypotension History of hypertension Possibly related to dehydration from recent vomiting, decreased oral intake and continuation of multiple antihypertensives(HCTZ, olmesartan , carvedilol ), BP 90/58 with EMS improving with bolus with EMS and additional boluses in the ED Continue IV fluids Cautiously continue carvedilol  Held hydrochlorothiazide  and olmesartan   Scalp hematoma/contusion, right parieto-occipital Secondary to fall CT head with no evidence of intracranial injury Pain control Ice packs as needed  Chronic pain syndrome Chronic opiate use Patient on MS Contin  15 mg 3 times daily, Robaxin 500 twice daily, Xanax  and gabapentin  Continue pain meds.  Will hold if altered  Diabetes mellitus, type II (HCC) Sliding scale insulin  coverage  Obstructive sleep apnea CPAP if desired    Subjective: Denies dizziness, Has mild headache at the site where he has contusion/hematoma from fall Denies chest pain, shortness of breath, fever, chills, abdominal symptoms  Physical Exam: Vitals:   12/10/23 1048 12/10/23 1051 12/10/23 1239 12/10/23 1617  BP: (!) 147/87 124/67 (!) 104/55 106/68  Pulse: 70 78 64 67  Resp:   16   Temp:   97.8 F (36.6 C) 98.4 F (36.9 C)  TempSrc:   Oral Oral  SpO2: 95% 90% 96% 93%  Weight:      Height:       General:  NAD/NARD. Alert & Oriented x 3. Pleasant and Cooperative.   Eyes: PERRLA. EOMI.   ENMT:   No deformities.  MMM.   Head: Noted swelling in right parieto-occipital area status post fall Neck: Supple. No cervical LAD.  No JVD.  Respiratory:  CTAB. No wheezing/rales/rhonchi appreciated.  Normal respiratory rate and rhythm.  Cardiovascular: Heart: RRR. Normal S1/S2. No  murmurs/gallops/rubs. No LE edema.   Abdomen:  Soft.  NT.  ND. Normoactive BS present. No rebound tenderness, guarding, fluid or masses/organomegaly present.   Genitourinary: Genital exam deferred.  Musculoskeletal:  No deformities, induration, or joint effusion. Non-tender to palpation.   Skin:  No rashes, nodules, or evidence of bleeding/bruising. Neurologic: Grossly intact.  Psychologic: Normal affect/mood. Intact speech, language, and memory   Data Reviewed:  Latest Reference Range & Units 12/10/23 03:24  Sodium 135 - 145 mmol/L 138  Potassium 3.5 - 5.1 mmol/L 4.4  Chloride 98 - 111 mmol/L 105  CO2 22 - 32 mmol/L 26  Glucose 70 - 99 mg/dL 765 (H)  BUN 8 - 23 mg/dL 24 (H)  Creatinine 9.38 - 1.24 mg/dL 8.86  Calcium  8.9 - 10.3 mg/dL 8.4 (L)  Anion gap 5 - 15  7  GFR, Estimated >60 mL/min >60  Lipoprotein (a) <75.0 nmol/L 55.2 (H)  WBC 4.0 - 10.5 K/uL 8.7  RBC 4.22 - 5.81 MIL/uL 4.78  Hemoglobin 13.0 - 17.0 g/dL 86.2  HCT 60.9 - 47.9 % 43.2  MCV 80.0 - 100.0 fL 90.4  MCH 26.0 - 34.0 pg 28.7  MCHC 30.0 - 36.0 g/dL 68.2  RDW 88.4 - 84.4 % 14.2  Platelets 150 - 400 K/uL 213  nRBC 0.0 - 0.2 % 0.0  Heparin Unfractionated 0.30 - 0.70 IU/mL 0.59  (H): Data is abnormally high (L): Data is abnormally low  Family Communication: none  Disposition: Status is: Observation   Planned Discharge Destination: Home  Time spent: 35 minutes  Author: Terral DELENA Seashore, MD 12/10/2023 4:29 PM  For on call review [email or web address].

## 2023-12-10 NOTE — Telephone Encounter (Signed)
 Monitor order placed as requested   Furth, Cadence H, PA-C  P Cv Div [facility] Scheduling; P Cv Div [facility] Triage The patient needs 2-3 week hosp follow-up please. Patient will also need a heart monitor mailed to his house for syncope. Thanks!

## 2023-12-10 NOTE — TOC CM/SW Note (Signed)
 Transition of Care [facility]) CM/SW Note    Transition of Care [facility]) - Inpatient Brief Assessment   Patient Details  Name: TAHJ NJOKU MRN: [id number] Date of Birth: 1945/04/14  Transition of Care [facility]) CM/SW Contact:    Alfonso Rummer, LCSW Phone Number: 12/10/2023, 4:31 PM   Clinical Narrative:  KEN DELENA Rummer completed Toc chart review. No toc needs identified please contact should needs arise   Transition of Care Asessment: Insurance and Status: Insurance coverage has been reviewed Patient has primary care physician: Yes (TEJAN-SIE, S AHMED) Home environment has been reviewed: single family home   Prior/Current Home Services: No current home services Social Drivers of Health Review: SDOH reviewed no interventions necessary Readmission risk has been reviewed: No Transition of care needs: no transition of care needs at this time

## 2023-12-10 NOTE — Plan of Care (Signed)
  Problem: Education: Goal: Knowledge of General Education information will improve Description: Including pain rating scale, medication(s)/side effects and non-pharmacologic comfort measures Outcome: Progressing   Problem: Health Behavior/Discharge Planning: Goal: Ability to manage health-related needs will improve Outcome: Progressing   Problem: Clinical Measurements: Goal: Ability to maintain clinical measurements within normal limits will improve Outcome: Progressing Goal: Will remain free from infection Outcome: Progressing Goal: Diagnostic test results will improve Outcome: Progressing Goal: Respiratory complications will improve Outcome: Progressing Goal: Cardiovascular complication will be avoided Outcome: Progressing   Problem: Activity: Goal: Risk for activity intolerance will decrease Outcome: Progressing   Problem: Nutrition: Goal: Adequate nutrition will be maintained Outcome: Progressing   Problem: Coping: Goal: Level of anxiety will decrease Outcome: Progressing   Problem: Elimination: Goal: Will not experience complications related to bowel motility Outcome: Progressing Goal: Will not experience complications related to urinary retention Outcome: Progressing   Problem: Pain Managment: Goal: General experience of comfort will improve and/or be controlled Outcome: Progressing   Problem: Safety: Goal: Ability to remain free from injury will improve Outcome: Progressing   Problem: Skin Integrity: Goal: Risk for impaired skin integrity will decrease Outcome: Progressing   Problem: Education: Goal: Understanding of cardiac disease, CV risk reduction, and recovery process will improve Outcome: Progressing Goal: Individualized Educational Video(s) Outcome: Progressing   Problem: Activity: Goal: Ability to tolerate increased activity will improve Outcome: Progressing   Problem: Cardiac: Goal: Ability to achieve and maintain adequate cardiovascular  perfusion will improve Outcome: Progressing   Problem: Health Behavior/Discharge Planning: Goal: Ability to safely manage health-related needs after discharge will improve Outcome: Progressing   Problem: Education: Goal: Ability to describe self-care measures that may prevent or decrease complications (Diabetes Survival Skills Education) will improve Outcome: Progressing Goal: Individualized Educational Video(s) Outcome: Progressing   Problem: Coping: Goal: Ability to adjust to condition or change in health will improve Outcome: Progressing   Problem: Fluid Volume: Goal: Ability to maintain a balanced intake and output will improve Outcome: Progressing   Problem: Health Behavior/Discharge Planning: Goal: Ability to identify and utilize available resources and services will improve Outcome: Progressing Goal: Ability to manage health-related needs will improve Outcome: Progressing   Problem: Metabolic: Goal: Ability to maintain appropriate glucose levels will improve Outcome: Progressing   Problem: Nutritional: Goal: Maintenance of adequate nutrition will improve Outcome: Progressing Goal: Progress toward achieving an optimal weight will improve Outcome: Progressing   Problem: Skin Integrity: Goal: Risk for impaired skin integrity will decrease Outcome: Progressing   Problem: Tissue Perfusion: Goal: Adequacy of tissue perfusion will improve Outcome: Progressing

## 2023-12-10 NOTE — Progress Notes (Signed)
*  PRELIMINARY RESULTS* Echocardiogram 2D Echocardiogram has been performed.  Alan Schmidt 12/10/2023, 8:06 AM

## 2023-12-10 NOTE — Progress Notes (Signed)
 Patient has a continuous glucose monitor(Dexcom) in his arm and he wants to use the blood sugar from his device, MD made aware of it,  MD agrees to get the CBG monitored from his Dexcom.

## 2023-12-10 NOTE — Consult Note (Addendum)
 Cardiology Consultation   Patient ID: Alan Schmidt MRN: [id number]; DOB: 1946-01-22  Admit date: 12/09/2023 Date of Consult: 12/10/2023  PCP:  [name], MD   [facility] Providers Cardiologist:  None     Patient Profile: Alan Schmidt is a 78 y.o. male with a hx of hypertension, hyperlipidemia, diabetes, former smoker, chronic back pain who is being seen 12/10/2023 for the evaluation of elevated troponin and syncope at the request of Dr. Monda.  History of Present Illness: Alan Schmidt was seen by Dr. Darliss today in March 2025 as a new patient for leg edema and chest pain.  Echo in April 2024 showed EF of 60 to 65%, grade 1 diastolic dysfunction.  The patient presented to the ER 12/09/2023 for syncope.  He was walking around his kitchen looking for something to eat when he began to feel dizzy.  He felt something was wrong and tried to sit down, but ended up waking up on the floor.  He does not remember anything about the fall. He came too fairly quickly. He does not remember having any chest pain, SOB, palpitations or heart racing surrounding the fall. His nurse aid came just minutes after and she called help to get him up of the floor. Patient did hit the back of his head when he fell.  EMS was called who noted blood pressure of 90/50 with a pulse of 98.  He received 500 mL normal saline and route.  In the ER blood pressure 130/70, pulse rate 102 bpm, respiratory rate 16, afebrile, 100% O2.  Labs showed serum creatinine 1.72, BUN 27, albumin 3.1, WBC 12.7, hemoglobin 13.2.  CK normal patient had glucose in his urine.  High-sensitivity troponin 201, 368.  EKG shows normal sinus rhythm with incomplete right bundle branch block, T wave inversion in inferolateral and anterior leads.  CT head was nonacute, did show soft tissue hematoma.  CT cervical spine was nonacute.  Chest CTA showed no PE, possible interstitial edema, right upper lobe pulmonary nodule, moderate  to severe coronary artery calcification.  Patient was given ASA, started on IV heparin and admitted for further workup.   Past Medical History:  Diagnosis Date   Acute postoperative pain 11/24/2016   Anxiety    Chronic hip pain (Right) 12/05/2014   Chronic lumbar pain    Depression    Diabetes mellitus without complication (HCC)    Hyperlipidemia    Hypertension    Kidney stones    Migraines     Past Surgical History:  Procedure Laterality Date   right hip surgery     4 surgeries   TONSILLECTOMY     TOTAL HIP ARTHROPLASTY       Home Medications:  Prior to Admission medications   Medication Sig Start Date End Date Taking? Authorizing Provider  acetaminophen  (TYLENOL ) 500 MG tablet Take 500 mg by mouth every 6 (six) hours as needed.   Yes [provider]  ALPRAZolam  (XANAX ) 1 MG tablet TAKE 1/2 TABLET BY MOUTH 2 TIMES DAILY AS NEEDED FOR ANXIETY. 11/30/23  Yes [name], MD  aspirin EC 81 MG tablet Take 81 mg by mouth daily.   Yes [provider]  buPROPion  (WELLBUTRIN  XL) 150 MG 24 hr tablet TAKE 1 TABLET (150 MG TOTAL) BY MOUTH DAILY FOR 7 DAYS, THEN 2 TABLETS (300 MG TOTAL) DAILY. 12/28/22  Yes Maribeth Camellia MATSU, MD  carvedilol  (COREG ) 25 MG tablet Take 1 tablet (25 mg total) by mouth 2 (  two) times daily. 08/05/23  Yes Tejan-Sie, GORMAN Dine, MD  desoximetasone  (TOPICORT ) 0.25 % cream APPLY CREAM TO AFFECTED AREA TWO TIMES DAILY, FOR UP TO 7 DAYS, DO NOT APPLY TO FACE   Strength: 0.25 % 07/23/22  Yes Maribeth Camellia MATSU, MD  empagliflozin  (JARDIANCE ) 25 MG TABS tablet Take 1 tablet by mouth daily. 11/22/23  Yes [name], MD  EPINEPHrine  (EPIPEN  2-PAK) 0.3 mg/0.3 mL IJ SOAJ injection Inject 0.3 mg into the muscle as needed for anaphylaxis. 05/14/20  Yes Maribeth Camellia MATSU, MD  escitalopram  (LEXAPRO ) 20 MG tablet Take 1 tablet by mouth daily. 11/22/23  Yes [name], MD  gabapentin  (NEURONTIN ) 250 MG/5ML solution Take 6 ml by mouth at bedtime.  11/04/21  Yes Maribeth Camellia MATSU, MD  hydrochlorothiazide  (HYDRODIURIL ) 25 MG tablet Take 1 tablet by mouth daily. 11/22/23  Yes [name], MD  insulin  lispro (HUMALOG  KWIKPEN) 100 UNIT/ML KwikPen INJECT 5 TO 7 UNITS UNDER THE SKIN THREE TIMES DAILY WITH MEALS 12/28/22  Yes Maribeth Camellia MATSU, MD  LANTUS SOLOSTAR 100 UNIT/ML Solostar Pen Inject 8-13 Units into the skin daily. 03/31/23  Yes [provider]  meloxicam  (MOBIC ) 15 MG tablet Take 1 tablet (15 mg total) by mouth daily. 12/03/23 02/01/24 Yes [name], MD  metFORMIN  (GLUCOPHAGE ) 1000 MG tablet Take 1,000 mg by mouth 2 (two) times daily. 09/15/23  Yes [provider]  methocarbamol (ROBAXIN) 500 MG tablet Take 500 mg by mouth 2 (two) times daily. 09/10/20  Yes [provider]  mometasone  (NASONEX ) 50 MCG/ACT nasal spray Instill 2 sprays into each nostril once daily. 11/12/23  Yes [name], MD  morphine  (MS CONTIN ) 15 MG 12 hr tablet Take 1 tablet (15 mg total) by mouth every 8 (eight) hours. Must last 30 days. Do not break tablet 11/19/23 12/19/23 Yes Patel, Seema K, NP  MOUNJARO  7.5 MG/0.5ML Pen Inject 7.5 mg into the skin once a week. Sunday 11/06/23  Yes [provider]  naloxone  (NARCAN ) nasal spray 4 mg/0.1 mL Place 1 spray into the nose as needed for up to 2 doses (for opioid-induced respiratory depresssion). In case of emergency (overdose), spray once into each nostril. If no response within 3 minutes, repeat application and call 911. 08/18/23  Yes Tejan-Sie, GORMAN Dine, MD  olmesartan  (BENICAR ) 40 MG tablet Take 1 tablet by mouth daily. 11/22/23  Yes [name], MD  pantoprazole  (PROTONIX ) 40 MG tablet Take 1 tablet by mouth daily. Patient taking differently: Take 40 mg by mouth daily as needed (heartburn). 11/22/23  Yes [name], MD  rosuvastatin  (CRESTOR ) 40 MG tablet Take 1 tablet (40 mg total) by mouth daily. 08/05/23  Yes [name], MD   sennosides-docusate sodium (SENOKOT-S) 8.6-50 MG tablet Take 1 tablet by mouth daily as needed.    Yes [provider]  tamsulosin  (FLOMAX ) 0.4 MG CAPS capsule Take 2 capsules by mouth daily after breakfast. 11/22/23  Yes Tejan-Sie, GORMAN Dine, MD  TRESIBA  FLEXTOUCH 100 UNIT/ML FlexTouch Pen Inject 13 Units into the skin daily. 09/01/23  Yes [provider]  ACCU-CHEK GUIDE test strip USE AS INSTRUCTED DX CODE: E11.9 01/29/22   Maribeth Camellia MATSU, MD  Continuous Blood Gluc Receiver (FREESTYLE LIBRE 14 DAY READER) DEVI 1 Device by Does not apply route daily. Use to scan to check blood sugar up to 7 times daily; E11.42, 05/17/18   Maribeth, Camellia MATSU, MD  Continuous Glucose Sensor (FREESTYLE LIBRE 14 DAY SENSOR) MISC  1 Device by Does not apply route every 14 (fourteen) days. E11.42 06/16/23   Hope Merle, MD  glucose blood (ACCU-CHEK GUIDE TEST) test strip Use as instructed 07/05/23   Hope Merle, MD  Insulin  Pen Needle (BD PEN NEEDLE NANO U/F) 32G X 4 MM MISC USE EVERY DAY 05/15/19   Maribeth Camellia MATSU, MD  Insulin  Pen Needle (PEN NEEDLES) 32G X 4 MM MISC Use with insulin  for daily injections 11/30/23   Tejan-Sie, S Ahmed, MD  loratadine  (CLARITIN ) 10 MG tablet Take 1 tablet (10 mg total) by mouth daily. 08/18/23 11/16/23  [name], MD  lubiprostone  (AMITIZA ) 8 MCG capsule TAKE 1 CAPSULE (8 MCG TOTAL) BY MOUTH 2 (TWO) TIMES DAILY WITH A MEAL. SWALLOW THE MEDICATION WHOLE. DO NOT BREAK OR CHEW THE MEDICATION. 04/10/22 11/05/23  Webb, Padonda B, FNP  metFORMIN  (GLUCOPHAGE ) 500 MG tablet Take 1 tablet (500 mg total) by mouth 2 (two) times daily with a meal. Patient not taking: Reported on 12/09/2023 09/20/23 12/19/23  [name], MD  morphine  (MS CONTIN ) 15 MG 12 hr tablet Take 1 tablet (15 mg total) by mouth every 8 (eight) hours. Must last 30 days. Do not break tablet 09/20/23 11/05/23  Patel, Seema K, NP  morphine  (MS CONTIN ) 15 MG 12 hr tablet Take 1 tablet (15 mg total) by mouth  every 8 (eight) hours. Must last 30 days. Do not break tablet 10/20/23 11/19/23  Patel, Seema K, NP  Wheat Dextrin (BENEFIBER) POWD Take 6 g by mouth 3 (three) times daily before meals. (2 tsp = 6 g) 12/14/19 11/05/23  Tanya Glisson, MD    Scheduled Meds:  aspirin EC  81 mg Oral Daily   buPROPion   300 mg Oral Daily   carvedilol   25 mg Oral BID WC   empagliflozin   25 mg Oral Daily   escitalopram   20 mg Oral Daily   gabapentin   300 mg Oral QHS   insulin  glargine-yfgn  8 Units Subcutaneous QHS   methocarbamol  500 mg Oral BID   morphine   15 mg Oral Q8H   rosuvastatin   40 mg Oral Daily   tamsulosin   0.8 mg Oral QPC breakfast   Continuous Infusions:  sodium chloride  100 mL/hr at 12/09/23 2335   heparin 1,300 Units/hr (12/10/23 0000)   PRN Meds: acetaminophen , nitroGLYCERIN, ondansetron  (ZOFRAN ) IV, pantoprazole   Allergies:    Allergies  Allergen Reactions   Contrast Media [Iodinated Contrast Media] Swelling   Iodine Swelling   Shellfish Allergy Nausea And Vomiting and Swelling    Social History:   Social History   Socioeconomic History   Marital status: Divorced    Spouse name: 1 Bio; 6 Adopted   Number of children: 7   Years of education: doctorate   Highest education level: Doctorate  Occupational History   Occupation: Retired  Tobacco Use   Smoking status: Former    Types: Cigarettes   Smokeless tobacco: Never  Vaping Use   Vaping status: Never Used  Substance and Sexual Activity   Alcohol use: No    Alcohol/week: 0.0 standard drinks of alcohol   Drug use: No   Sexual activity: Not Currently  Other Topics Concern   Not on file  Social History Narrative   Per patient he has 1 biological child and 6 adopted children   Social Drivers of Corporate Investment Banker Strain: High Risk (03/31/2023)   Received [facility]   Overall Financial Resource Strain (CARDIA)  Difficulty of Paying Living Expenses: Very hard  Food Insecurity: No  Food Insecurity (12/09/2023)   Hunger Vital Sign    Worried About Running Out of Food in the Last Year: Never true    Ran Out of Food in the Last Year: Never true  Transportation Needs: No Transportation Needs (12/10/2023)   PRAPARE - Administrator, Civil Service (Medical): No    Lack of Transportation (Non-Medical): No  Physical Activity: Inactive (02/16/2023)   Exercise Vital Sign    Days of Exercise per Week: 0 days    Minutes of Exercise per Session: 0 min  Stress: No Stress Concern Present (02/16/2023)   [employer] of Occupational Health - Occupational Stress Questionnaire    Feeling of Stress : Only a little  Social Connections: Moderately Isolated (12/10/2023)   Social Connection and Isolation Panel    Frequency of Communication with Friends and Family: More than three times a week    Frequency of Social Gatherings with Friends and Family: Never    Attends Religious Services: Never    Database Administrator or Organizations: Yes    Attends Banker Meetings: Never    Marital Status: Divorced  Catering Manager Violence: Not At Risk (12/10/2023)   Humiliation, Afraid, Rape, and Kick questionnaire    Fear of Current or Ex-Partner: No    Emotionally Abused: No    Physically Abused: No    Sexually Abused: No    Family History:    Family History  Problem Relation Age of Onset   Cancer Mother    Heart disease Father    Stroke Father    Diabetes Father    Diabetes Sister    Diabetes Sister    Mental illness Neg Hx      ROS:  Please see the history of present illness.   All other ROS reviewed and negative.     Physical Exam/Data: Vitals:   12/10/23 0451 12/10/23 0456 12/10/23 0459 12/10/23 0500  BP: 125/64  (!) 143/81   Pulse: 75 79 82   Resp: 20  20   Temp: 98.5 F (36.9 C)  (!) 97.4 F (36.3 C)   TempSrc:      SpO2: 96% 90% 94%   Weight:    110 kg  Height:        Intake/Output Summary (Last 24 hours) at 12/10/2023 0733 Last data  filed at 12/10/2023 0000 Gross per 24 hour  Intake 1547.64 ml  Output --  Net 1547.64 ml      12/10/2023    5:00 AM 12/09/2023    8:48 PM 11/05/2023    2:31 PM  Last 3 Weights  Weight (lbs) 242 lb 8.1 oz 254 lb 3.1 oz 244 lb 3.2 oz  Weight (kg) 110 kg 115.3 kg 110.768 kg     Body mass index is 31.99 kg/m.  General:  Well nourished, well developed, in no acute distress HEENT: normal Neck: no JVD Vascular: No carotid bruits; Distal pulses 2+ bilaterally Cardiac:  normal S1, S2; RRR; no murmur  Lungs:  clear to auscultation bilaterally, no wheezing, rhonchi or rales  Abd: soft, nontender, no hepatomegaly  Ext: mild lower leg edema Musculoskeletal:  No deformities, BUE and BLE strength normal and equal Skin: warm and dry  Neuro:  CNs 2-12 intact, no focal abnormalities noted Psych:  Normal affect   EKG:  The EKG was personally reviewed and demonstrates:  NSR 98bpm, TWI ant/inf/lat leads, incomplete RBBB Telemetry:  Telemetry  was personally reviewed and demonstrates:  NSR HR 80s  Relevant CV Studies:  Echo 05/2023  1. Left ventricular ejection fraction, by estimation, is 60 to 65%. The  left ventricle has normal function. The left ventricle has no regional  wall motion abnormalities. Left ventricular diastolic parameters are  consistent with Grade I diastolic  dysfunction (impaired relaxation). The average left ventricular global  longitudinal strain is -17.3 %. The global longitudinal strain is normal.   2. Right ventricular systolic function is normal. The right ventricular  size is normal. Tricuspid regurgitation signal is inadequate for assessing  PA pressure.   3. The mitral valve is normal in structure. No evidence of mitral valve  regurgitation. No evidence of mitral stenosis.   4. The aortic valve is tricuspid. Aortic valve regurgitation is not  visualized. No aortic stenosis is present.   5. The inferior vena cava is normal in size with greater than 50%  respiratory  variability, suggesting right atrial pressure of 3 mmHg.   Laboratory Data: High Sensitivity Troponin:   Recent Labs  Lab 12/09/23 1510 12/09/23 1942  TROPONINIHS 201* 368*     Chemistry Recent Labs  Lab 12/09/23 1510 12/10/23 0324  NA 139 138  K 3.8 4.4  CL 103 105  CO2 25 26  GLUCOSE 185* 234*  BUN 27* 24*  CREATININE 1.72* 1.13  CALCIUM  7.9* 8.4*  GFRNONAA 40* >60  ANIONGAP 11 7    Recent Labs  Lab 12/09/23 1510  PROT 7.1  ALBUMIN 3.1*  AST 26  ALT 19  ALKPHOS 68  BILITOT 0.6   Lipids No results for input(s): CHOL, TRIG, HDL, LABVLDL, LDLCALC, CHOLHDL in the last 168 hours.  Hematology Recent Labs  Lab 12/09/23 1510 12/10/23 0324  WBC 12.7* 8.7  RBC 4.60 4.78  HGB 13.2 13.7  HCT 42.4 43.2  MCV 92.2 90.4  MCH 28.7 28.7  MCHC 31.1 31.7  RDW 14.1 14.2  PLT 200 213   Thyroid  No results for input(s): TSH, FREET4 in the last 168 hours.  BNPNo results for input(s): BNP, PROBNP in the last 168 hours.  DDimer No results for input(s): DDIMER in the last 168 hours.  Radiology/Studies:  CT Angio Chest PE W and/or Wo Contrast Result Date: 12/10/2023 CLINICAL DATA:  Suspected pulmonary embolism. EXAM: CT ANGIOGRAPHY CHEST WITH CONTRAST TECHNIQUE: Multidetector CT imaging of the chest was performed using the standard protocol during bolus administration of intravenous contrast. Multiplanar CT image reconstructions and MIPs were obtained to evaluate the vascular anatomy. RADIATION DOSE REDUCTION: This exam was performed according to the departmental dose-optimization program which includes automated exposure control, adjustment of the mA and/or kV according to patient size and/or use of iterative reconstruction technique. CONTRAST:  80mL OMNIPAQUE IOHEXOL 350 MG/ML SOLN COMPARISON:  None Available. FINDINGS: Cardiovascular: Satisfactory opacification of the pulmonary arteries to the segmental level. No evidence of pulmonary embolism. Normal heart  size with moderate severity coronary artery calcification. No pericardial effusion. Mediastinum/Nodes: No enlarged mediastinal, hilar, or axillary lymph nodes. Thyroid  gland, trachea, and esophagus demonstrate no significant findings. Lungs/Pleura: Diffuse bilateral interstitial thickening is seen. Mild bilateral atelectatic changes are, slightly more prominent within the bilateral lower lobes. A 2 mm anterolateral right upper lobe pulmonary nodule is present (axial CT image 65, CT series 5). No pleural effusion or pneumothorax is identified. Upper Abdomen: Punctate parenchymal calcifications are seen scattered throughout the spleen. Musculoskeletal: No chest wall abnormality. No acute or significant osseous findings. Review of the MIP images confirms the  above findings. IMPRESSION: 1. No evidence of pulmonary embolism. 2. Diffuse bilateral interstitial thickening which may represent sequelae associated with interstitial edema. 3. 2 mm anterolateral right upper lobe pulmonary nodule. No follow-up needed if patient is low-risk.This recommendation follows the consensus statement: Guidelines for Management of Incidental Pulmonary Nodules Detected on CT Images: From the Fleischner Society 2017; Radiology 2017; 284:228-243. 4. Moderate severity coronary artery calcification. 5. Evidence of prior granulomatous disease. Electronically Signed   By: Suzen Dials M.D.   On: 12/10/2023 01:04   CT Cervical Spine Wo Contrast Result Date: 12/09/2023 EXAM: CT CERVICAL SPINE WITHOUT CONTRAST 12/09/2023 08:39:45 PM TECHNIQUE: CT of the cervical spine was performed without the administration of intravenous contrast. Multiplanar reformatted images are provided for review. Automated exposure control, iterative reconstruction, and/or weight based adjustment of the mA/kV was utilized to reduce the radiation dose to as low as reasonably achievable. COMPARISON: None available. CLINICAL HISTORY: Neck trauma (Age >= 65y). FINDINGS:  CERVICAL SPINE: BONES AND ALIGNMENT: No acute fracture or traumatic malalignment. DEGENERATIVE CHANGES: Multilevel spondylosis. Partial ankylosis of the C4-C6 vertebral bodies. No severe spinal canal narrowing. SOFT TISSUES: No prevertebral soft tissue swelling. IMPRESSION: 1. No acute abnormality of the cervical spine. Electronically signed by: Norman Gatlin MD 12/09/2023 08:44 PM EST RP Workstation: [id number]   CT Head Wo Contrast Result Date: 12/09/2023 EXAM: CT HEAD WITHOUT CONTRAST 12/09/2023 08:39:45 PM TECHNIQUE: CT of the head was performed without the administration of intravenous contrast. Automated exposure control, iterative reconstruction, and/or weight based adjustment of the mA/kV was utilized to reduce the radiation dose to as low as reasonably achievable. COMPARISON: None available. CLINICAL HISTORY: Head trauma, minor (Age >= 65y). FINDINGS: BRAIN AND VENTRICLES: No acute hemorrhage. No evidence of acute infarct. No hydrocephalus. No extra-axial collection. No mass effect or midline shift. Patchy and confluent decreased attenuation throughout deep and periventricular white matter of cerebral hemispheres bilaterally, compatible with chronic microvascular ischemic disease. ORBITS: No acute abnormality. Bilateral lens replacement. SINUSES: No acute abnormality. SOFT TISSUES AND SKULL: No acute soft tissue abnormality. No skull fracture. Right parieto-occipital subcutaneous soft tissue hematoma. IMPRESSION: 1. No acute intracranial abnormality. 2. Right parieto-occipital subcutaneous soft tissue hematoma. Electronically signed by: Norman Gatlin MD 12/09/2023 08:42 PM EST RP Workstation: [id number]     Assessment and Plan:  NSTEMI CAC - presented with syncope found to have hypotension and elevated troponin with AKI. No chest pain reported - HS trop 201>368 - EKG with tWI inf/lat/ant leads - IV heparin and ASA in the ER - repeat echo (unofficial read) showed normal LVEF, mild diastolic  dysfunction, mild LVH, no AS, no WMA -  Chest CTA showed CAC - continue PTA ASA 81mg  daily, Coreg  25mg  bID, Crestor  40mg  daily - discussed cath vs noninvasive studies. Patient is not wanting to undergo procedure if it's not absolutely necessary. Recommended cath, but patient refused at this time. We will check a third troponin.  Syncope Hypotension - EMS found patient hypotensive s/p IVF with improvement - PTA hydrochlorothiazide  and olmesartan  held - check orthostatics - echo normal as above - carotid US  ordered - tele is unremarkable - BP improved>he is on Coreg  - he will need heart monitor at d/c  AKI - Scr 1.72>1.13 after IVF  HLD - LDL 41 - Crestor  40mg  daily   For questions or updates, please contact [facility] Please consult [email or web address] for contact info under      Signed, Mykiah Schmuck VEAR Fishman, PA-C  12/10/2023 7:33 AM

## 2023-12-10 NOTE — Telephone Encounter (Signed)
-----   Message from Cadence VEAR Fishman sent at 12/10/2023 10:17 AM EST ----- Regarding: Hosp follow-up The patient needs 2-3 week hosp follow-up please. Patient will also need a heart monitor mailed to his house for syncope. Thanks!

## 2023-12-11 DIAGNOSIS — I2510 Atherosclerotic heart disease of native coronary artery without angina pectoris: Secondary | ICD-10-CM | POA: Diagnosis not present

## 2023-12-11 DIAGNOSIS — I700 Atherosclerosis of aorta: Secondary | ICD-10-CM | POA: Diagnosis not present

## 2023-12-11 DIAGNOSIS — R55 Syncope and collapse: Secondary | ICD-10-CM

## 2023-12-11 DIAGNOSIS — E119 Type 2 diabetes mellitus without complications: Secondary | ICD-10-CM | POA: Diagnosis not present

## 2023-12-11 LAB — CBC
HCT: 42.4 % (ref 39.0–52.0)
Hemoglobin: 13.2 g/dL (ref 13.0–17.0)
MCH: 28.8 pg (ref 26.0–34.0)
MCHC: 31.1 g/dL (ref 30.0–36.0)
MCV: 92.4 fL (ref 80.0–100.0)
Platelets: 202 K/uL (ref 150–400)
RBC: 4.59 MIL/uL (ref 4.22–5.81)
RDW: 14.5 % (ref 11.5–15.5)
WBC: 8.2 K/uL (ref 4.0–10.5)
nRBC: 0 % (ref 0.0–0.2)

## 2023-12-11 MED ORDER — CARVEDILOL 12.5 MG PO TABS: 12.5000 mg | ORAL_TABLET | Freq: Two times a day (BID) | ORAL | 1 refills | Status: AC

## 2023-12-11 MED ORDER — CARVEDILOL 12.5 MG PO TABS: 12.5000 mg | ORAL_TABLET | Freq: Two times a day (BID) | ORAL | Status: DC

## 2023-12-11 MED ORDER — OLMESARTAN MEDOXOMIL 40 MG PO TABS: 20.0000 mg | ORAL_TABLET | Freq: Every day | ORAL | 1 refills | Status: DC

## 2023-12-11 NOTE — TOC Progression Note (Signed)
 Transition of Care [facility]) - Progression Note    Patient Details  Name: Alan Schmidt MRN: [id number] Date of Birth: [birth year]  [facility] CM/SW Contact  Victory Jackquline RAMAN, RN Phone Number: 12/11/2023, 5:22 PM  Clinical Narrative:   RNCM received secure chat from the MD requesting the patient needs holter monitor- please arrnage that before discharge and release him. Called placed to Supervisor on call twice and tried to reach via secure chat with no response. Called department manager and asked who we could get a Holter monitor and he said that Cardiology sets that up and that TOC doesn't do that. MD made aware. MD is canceling discharge until the Holter Monitor can be set up. ). RNCM will continue to follow for discharge planning needs.                      Expected Discharge Plan and Services         Expected Discharge Date: 12/11/23                                     Social Drivers of Health (SDOH) Interventions SDOH Screenings   Food Insecurity: No Food Insecurity (12/09/2023)  Housing: Low Risk  (12/09/2023)  Transportation Needs: No Transportation Needs (12/10/2023)  Utilities: Not At Risk (12/10/2023)  Alcohol Screen: Low Risk  (02/16/2023)  Depression (PHQ2-9): Low Risk  (05/06/2023)  Recent Concern: Depression (PHQ2-9) - Medium Risk (02/11/2023)  Financial Resource Strain: High Risk (03/31/2023)   Received from [facility]  Physical Activity: Inactive (02/16/2023)  Social Connections: Moderately Isolated (12/10/2023)  Stress: No Stress Concern Present (02/16/2023)  Tobacco Use: Medium Risk (11/05/2023)  Health Literacy: Adequate Health Literacy (02/16/2023)    Readmission Risk Interventions     No data to display

## 2023-12-11 NOTE — Progress Notes (Addendum)
 Progress Note   Patient: Alan Schmidt FMW:[id number] DOB: Jan 09, 1946 DOA: 12/09/2023     0 DOS: the patient was seen and examined on 12/11/2023   Brief hospital course: Pt has been admitted for possible NSTEMI and syncope evaluation  Assessment and Plan: Syncope and collapse likely from hypotension/polypharmacy Per cardiology, less likely cardiac Patient on multiple sedating meds including MS Contin , gabapentin , Robaxin, Xanax ,--- Will modify doses if able Hypotension resolved Fall precautions Neurologic checks Continuous cardiac monitoring Echo:Left ventricular ejection fraction, by estimation, is 60 to 65%. The left ventricle has normal function. Left ventricular endocardial border not optimally defined to evaluate regional wall motion. There is mild asymmetric left ventricular hypertrophy. Left ventricular diastolic parameters are consistent with Grade I diastolic dysfunction Reviewed CTA chest: No evidence of pulmonary embolism. Diffuse bilateral interstitial thickening which may represent sequelae associated with interstitial edema. 2 mm anterolateral right upper lobe pulmonary nodule. No follow-up needed if patient is low-risk Repeated orthostatics today: negative (BP lying 135/76 and HR 56, sitting 143/81 and HR 58, Standing 0 mins 128/85 and HR 64, Standing 3 minutes 138/70 and HR 64 ) Cardiology recommended for event monitor at discharge. Patent can be discharged after event monitor is placed. Given HR in low 60s and 50s, reduce carvedilol  to 12.5mg  BID  Headache: Secondary to subcutaneous hematoma status post fall CT head showed no acute intracranial abnormality. Right parieto-occipital subcutaneous soft tissue hematoma. Pain control  NSTEMI (non-ST elevated myocardial infarction) (HCC) History of atypical chest pain 05/2023 Denies chest pain or shortness of breath.  EKG nonacute Troponin 201-> 368--> 194 Per cardiology, less likely cardiac Discontinued Heparin  drip Reviewed echo:Left ventricular ejection fraction, by estimation, is 60 to 65%. The left ventricle has normal function. Left ventricular endocardial border not optimally defined to evaluate regional wall motion. There is mild asymmetric left ventricular hypertrophy. Left ventricular diastolic parameters are consistent with Grade I diastolic dysfunction Continue aspirin, rosuvastatin  Given HR in low 60s and 50s, reduce carvedilol  to 12.5mg  BID  AKI (acute kidney injury) Creatinine improved from 1.72 to 1.13 Likely from from dehydration and hypotension  Stopped IV fluids Monitor renal function and avoid nephrotoxins at outpatient visit  Hypotension: BP improved s/p IVFs History of hypertension Sinus bradycardia: Given HR dropped to 40s-50s, reduced carvedilol  to 12.5mg  BID Held hydrochlorothiazide  and olmesartan   Scalp hematoma/contusion, right parieto-occipital Secondary to fall CT head with no evidence of intracranial injury Pain control Ice packs as needed  Chronic pain syndrome Chronic opiate use Patient on MS Contin  15 mg 3 times daily, Robaxin 500 twice daily, Xanax  and gabapentin  Continue pain meds.  Will hold if altered  Diabetes mellitus, type II (HCC) Sliding scale insulin  coverage  Obstructive sleep apnea CPAP if desired    Subjective: Denies dizziness,headache, chest pain, shortness of breath, fever, chills, abdominal symptoms  Physical Exam: Vitals:   12/11/23 0415 12/11/23 0453 12/11/23 0853 12/11/23 1216  BP: (!) 141/105 130/80 (!) 143/79 135/76  Pulse: 62  65 (!) 56  Resp: 20  18 16   Temp: 97.9 F (36.6 C)  (!) 97.5 F (36.4 C) 98.1 F (36.7 C)  TempSrc:   Oral Oral  SpO2: 93%  93%   Weight:      Height:       General:  NAD/NARD. Alert & Oriented x 3. Pleasant and Cooperative.   Eyes: PERRLA. EOMI.   ENMT:   No deformities.  MMM.   Head: Swelling in right parieto-occipital area status post fall- improving Neck: Supple. No  cervical LAD.  No  JVD.  Respiratory:  CTAB. No wheezing/rales/rhonchi appreciated.  Normal respiratory rate and rhythm.  Cardiovascular: Heart: RRR. Normal S1/S2. No murmurs/gallops/rubs. No LE edema.   Abdomen:  Soft. NT.  ND. Normoactive BS present. No rebound tenderness, guarding, fluid or masses/organomegaly present.   Genitourinary: Genital exam deferred.  Musculoskeletal:  No deformities, induration, or joint effusion. Non-tender to palpation.   Skin:  No rashes, nodules, or evidence of bleeding/bruising. Neurologic: Grossly intact.  Psychologic: Normal affect/mood. Intact speech, language, and memory   Data Reviewed:  Latest Reference Range & Units 12/10/23 03:24  Sodium 135 - 145 mmol/L 138  Potassium 3.5 - 5.1 mmol/L 4.4  Chloride 98 - 111 mmol/L 105  CO2 22 - 32 mmol/L 26  Glucose 70 - 99 mg/dL 765 (H)  BUN 8 - 23 mg/dL 24 (H)  Creatinine 9.38 - 1.24 mg/dL 8.86  Calcium  8.9 - 10.3 mg/dL 8.4 (L)  Anion gap 5 - 15  7  GFR, Estimated >60 mL/min >60  Lipoprotein (a) <75.0 nmol/L 55.2 (H)  WBC 4.0 - 10.5 K/uL 8.7  RBC 4.22 - 5.81 MIL/uL 4.78  Hemoglobin 13.0 - 17.0 g/dL 86.2  HCT 60.9 - 47.9 % 43.2  MCV 80.0 - 100.0 fL 90.4  MCH 26.0 - 34.0 pg 28.7  MCHC 30.0 - 36.0 g/dL 68.2  RDW 88.4 - 84.4 % 14.2  Platelets 150 - 400 K/uL 213  nRBC 0.0 - 0.2 % 0.0  Heparin Unfractionated 0.30 - 0.70 IU/mL 0.59  (H): Data is abnormally high (L): Data is abnormally low  Family Communication: none  Disposition: Status is: Observation   Planned Discharge Destination: Home  Time spent: 35 minutes  Author: Terral DELENA Seashore, MD 12/11/2023 5:11 PM  For on call review [email or web address].

## 2023-12-11 NOTE — Discharge Summary (Signed)
 Physician Discharge Summary   Patient: Alan Schmidt MRN: [id number] DOB: [birth year]  Admit date:     12/09/2023  Discharge date: 12/11/23  Discharge Physician: Terral DELENA Seashore   PCP: [name], MD   Recommendations at discharge:  NO DRIVING UNTIL CLEARED BY PCP/CARDIOLOGIST HOLD COREG  IF HEART RATE IS < 60  Discharge Diagnoses: Active Problems:   Opiate use (60 MME/Day)   Chronic pain syndrome   Scalp hematoma/contusion, right parieto-occipital   Essential hypertension   Obstructive sleep apnea   Diabetes mellitus, type II (HCC)   Mixed hyperlipidemia   Coronary artery calcification   Aortic atherosclerosis  Principal Problem (Resolved):   Syncope and collapse Resolved Problems:   NSTEMI (non-ST elevated myocardial infarction) (HCC)   Hypotension   AKI (acute kidney injury)  Hospital Course: Pt has been admitted for possible NSTEMI and syncope evaluation   Assessment and Plan: Syncope and collapse likely from hypotension/polypharmacy Per cardiology, less likely cardiac Patient on multiple sedating meds including MS Contin , gabapentin , Robaxin, Xanax ,--- Will modify doses if able Hypotension resolved Fall precautions Neurologic checks Continuous cardiac monitoring Echo:Left ventricular ejection fraction, by estimation, is 60 to 65%. The left ventricle has normal function. Left ventricular endocardial border not optimally defined to evaluate regional wall motion. There is mild asymmetric left ventricular hypertrophy. Left ventricular diastolic parameters are consistent with Grade I diastolic dysfunction Reviewed CTA chest: No evidence of pulmonary embolism. Diffuse bilateral interstitial thickening which may represent sequelae associated with interstitial edema. 2 mm anterolateral right upper lobe pulmonary nodule. No follow-up needed if patient is low-risk Repeated orthostatics today: negative (BP lying 135/76 and HR 56, sitting 143/81 and HR 58, Standing 0  mins 128/85 and HR 64, Standing 3 minutes 138/70 and HR 64 ) Cardiology recommended for event monitor at discharge. Given HR in low 60s and 50s, reduced carvedilol  to 12.5mg  BID Holter/ Event monitor will be sent to his home by mail as per cardiology   Headache: Secondary to subcutaneous hematoma status post fall CT head showed no acute intracranial abnormality. Right parieto-occipital subcutaneous soft tissue hematoma. Pain control   NSTEMI (non-ST elevated myocardial infarction) (HCC) History of atypical chest pain 05/2023 Denies chest pain or shortness of breath.  EKG nonacute Troponin 201-> 368--> 194 Per cardiology, less likely cardiac Discontinued Heparin drip Reviewed echo:Left ventricular ejection fraction, by estimation, is 60 to 65%. The left ventricle has normal function. Left ventricular endocardial border not optimally defined to evaluate regional wall motion. There is mild asymmetric left ventricular hypertrophy. Left ventricular diastolic parameters are consistent with Grade I diastolic dysfunction Continue aspirin, rosuvastatin  Cardiology team discussed cath vs noninvasive studies. Patient is not wanting to undergo procedure if it's not absolutely necessary. Recommended cath, but patient refused at this time.  Given HR in low 60s and 50s, reduced carvedilol  to 12.5mg  BID Holter/ Event monitor will be sent to his home by mail as per cardiology   AKI (acute kidney injury) Creatinine improved from 1.72 to 1.13 Likely from from dehydration and hypotension  Stopped IV fluids Monitor renal function and avoid nephrotoxins at outpatient visit   Hypotension: BP improved s/p IVFs History of hypertension Sinus bradycardia: Given HR dropped to 40s-50s, reduced carvedilol  to 12.5mg  BID Held hydrochlorothiazide  and olmesartan  Holter/ Event monitor will be sent to his home by mail as per cardiology   Scalp hematoma/contusion, right parieto-occipital Secondary to fall CT head with  no evidence of intracranial injury Pain control Ice packs as needed   Chronic pain  syndrome Chronic opiate use Patient on MS Contin  15 mg 3 times daily, Robaxin 500 twice daily, Xanax  and gabapentin  Continue pain meds.  Will hold if altered   Diabetes mellitus, type II (HCC) Sliding scale insulin  coverage   Obstructive sleep apnea CPAP if desired    Disposition: Home Diet recommendation:  Discharge Diet Orders (From admission, onward)     Start     Ordered   12/11/23 0000  Diet - low sodium heart healthy        12/11/23 1515           Cardiac diet DISCHARGE MEDICATION: Allergies as of 12/11/2023       Reactions   Contrast Media [iodinated Contrast Media] Swelling   Iodine Swelling   Shellfish Allergy Nausea And Vomiting, Swelling        Medication List     STOP taking these medications    acetaminophen  500 MG tablet Commonly known as: TYLENOL    hydrochlorothiazide  25 MG tablet Commonly known as: HYDRODIURIL    Lantus SoloStar 100 UNIT/ML Solostar Pen Generic drug: insulin  glargine   Mounjaro  7.5 MG/0.5ML Pen Generic drug: tirzepatide        TAKE these medications    Accu-Chek Guide test strip Generic drug: glucose blood USE AS INSTRUCTED DX CODE: E11.9   Accu-Chek Guide Test test strip Generic drug: glucose blood Use as instructed   ALPRAZolam  1 MG tablet Commonly known as: XANAX  TAKE 1/2 TABLET BY MOUTH 2 TIMES DAILY AS NEEDED FOR ANXIETY.   aspirin EC 81 MG tablet Take 81 mg by mouth daily.   BD Pen Needle Nano U/F 32G X 4 MM Misc Generic drug: Insulin  Pen Needle USE EVERY DAY   Pen Needles 32G X 4 MM Misc Use with insulin  for daily injections   Benefiber Powd Take 6 g by mouth 3 (three) times daily before meals. (2 tsp = 6 g)   buPROPion  150 MG 24 hr tablet Commonly known as: WELLBUTRIN  XL TAKE 1 TABLET (150 MG TOTAL) BY MOUTH DAILY FOR 7 DAYS, THEN 2 TABLETS (300 MG TOTAL) DAILY.   carvedilol  12.5 MG tablet Commonly known  as: COREG  Take 1 tablet (12.5 mg total) by mouth 2 (two) times daily with a meal. What changed:  medication strength how much to take when to take this   desoximetasone  0.25 % cream Commonly known as: TOPICORT  APPLY CREAM TO AFFECTED AREA TWO TIMES DAILY, FOR UP TO 7 DAYS, DO NOT APPLY TO FACE   Strength: 0.25 %   EPINEPHrine  0.3 mg/0.3 mL Soaj injection Commonly known as: EpiPen  2-Pak Inject 0.3 mg into the muscle as needed for anaphylaxis.   escitalopram  20 MG tablet Commonly known as: LEXAPRO  Take 1 tablet by mouth daily.   FreeStyle Libre 14 Day Reader Espiridion 1 Device by Does not apply route daily. Use to scan to check blood sugar up to 7 times daily; E11.42,   FreeStyle Libre 14 Day Sensor Misc 1 Device by Does not apply route every 14 (fourteen) days. E11.42   gabapentin  250 MG/5ML solution Commonly known as: NEURONTIN  Take 6 ml by mouth at bedtime.   insulin  lispro 100 UNIT/ML KwikPen Commonly known as: HumaLOG  KwikPen INJECT 5 TO 7 UNITS UNDER THE SKIN THREE TIMES DAILY WITH MEALS   Jardiance  25 MG Tabs tablet Generic drug: empagliflozin  Take 1 tablet by mouth daily.   loratadine  10 MG tablet Commonly known as: CLARITIN  Take 1 tablet (10 mg total) by mouth daily.   lubiprostone  8 MCG capsule  Commonly known as: AMITIZA  TAKE 1 CAPSULE (8 MCG TOTAL) BY MOUTH 2 (TWO) TIMES DAILY WITH A MEAL. SWALLOW THE MEDICATION WHOLE. DO NOT BREAK OR CHEW THE MEDICATION.   meloxicam  15 MG tablet Commonly known as: MOBIC  Take 1 tablet (15 mg total) by mouth daily.   metFORMIN  1000 MG tablet Commonly known as: GLUCOPHAGE  Take 1,000 mg by mouth 2 (two) times daily. What changed: Another medication with the same name was removed. Continue taking this medication, and follow the directions you see here.   methocarbamol 500 MG tablet Commonly known as: ROBAXIN Take 500 mg by mouth 2 (two) times daily.   mometasone  50 MCG/ACT nasal spray Commonly known as: NASONEX  Instill 2  sprays into each nostril once daily.   morphine  15 MG 12 hr tablet Commonly known as: MS CONTIN  Take 1 tablet (15 mg total) by mouth every 8 (eight) hours. Must last 30 days. Do not break tablet What changed: Another medication with the same name was removed. Continue taking this medication, and follow the directions you see here.   naloxone  4 MG/0.1ML Liqd nasal spray kit Commonly known as: NARCAN  Place 1 spray into the nose as needed for up to 2 doses (for opioid-induced respiratory depresssion). In case of emergency (overdose), spray once into each nostril. If no response within 3 minutes, repeat application and call 911.   olmesartan  40 MG tablet Commonly known as: BENICAR  Take 0.5 tablets (20 mg total) by mouth daily. What changed: how much to take   pantoprazole  40 MG tablet Commonly known as: PROTONIX  Take 1 tablet by mouth daily. What changed:  when to take this reasons to take this   rosuvastatin  40 MG tablet Commonly known as: CRESTOR  Take 1 tablet (40 mg total) by mouth daily.   sennosides-docusate sodium 8.6-50 MG tablet Commonly known as: SENOKOT-S Take 1 tablet by mouth daily as needed.   tamsulosin  0.4 MG Caps capsule Commonly known as: FLOMAX  Take 2 capsules by mouth daily after breakfast.   Tresiba  FlexTouch 100 UNIT/ML FlexTouch Pen Generic drug: insulin  degludec Inject 13 Units into the skin daily.        Discharge Exam: Filed Weights   12/09/23 2048 12/10/23 0500  Weight: 115.3 kg 110 kg   General:  NAD/NARD. Alert & Oriented x 3. Pleasant and Cooperative.   Eyes: PERRLA. EOMI.   ENMT:   No deformities.  MMM.   Head: Swelling in right parieto-occipital area status post fall- improving Neck: Supple. No cervical LAD.  No JVD.  Respiratory:  CTAB. No wheezing/rales/rhonchi appreciated.  Normal respiratory rate and rhythm.  Cardiovascular: Heart: RRR. Normal S1/S2. No murmurs/gallops/rubs. No LE edema.   Abdomen:  Soft. NT.  ND. Normoactive BS  present. No rebound tenderness, guarding, fluid or masses/organomegaly present.   Genitourinary: Genital exam deferred.  Musculoskeletal:  No deformities, induration, or joint effusion. Non-tender to palpation.   Skin:  No rashes, nodules, or evidence of bleeding/bruising. Neurologic: Grossly intact.  Psychologic: Normal affect/mood. Intact speech, language, and memory  Condition at discharge: stable  The results of significant diagnostics from this hospitalization (including imaging, microbiology, ancillary and laboratory) are listed below for reference.   Imaging Studies: US  Carotid Bilateral Result Date: 12/10/2023 EXAM: US  CAROTID DUPLEX 12/10/2023 10:30:26 AM TECHNIQUE: Real-time grayscale, color flow and spectral Doppler sonographic images were obtained of the extracranial carotid system using a linear transducer. COMPARISON: None available CLINICAL HISTORY: Syncope and collapse. Hypertension. FINDINGS: RIGHT: Common carotid artery: 92 cm/s Internal carotid artery: 104 cm/s  External carotid artery: 126 cm/s Right ICA/CCA ratio: 1.1 Plaque: None Vertebral artery: Antegrade LEFT: Common carotid artery: 91 cm/s Internal carotid artery: 72.8 cm/s External carotid artery: 97 cm/s LEFT ICA/CCA ratio: 0.8 Plaque: None Vertebral artery: Antegrade Normal carotid waveforms are present throughout. There is no significant occlusive disease. IMPRESSION: 1. No hemodynamically significant stenosis (greater than 50%) within the extracranial internal carotid arteries. Electronically signed by: Dayne Hassell MD 12/10/2023 11:17 AM EST RP Workstation: [id number]   ECHOCARDIOGRAM COMPLETE Result Date: 12/10/2023    ECHOCARDIOGRAM REPORT   Patient Name:   Alan Schmidt Date of Exam: 12/10/2023 Medical Rec #:  [id number]        Height:       73.0 in Accession #:    [id number]       Weight:       242.5 lb Date of Birth:  May 07, 1945        BSA:          2.335 m Patient Age:    78 years         BP:           143/81  mmHg Patient Gender: M                HR:           82 bpm. Exam Location:  [facility] Procedure: 2D Echo, Cardiac Doppler and Color Doppler (Both Spectral and Color            Flow Doppler were utilized during procedure). Indications:     Stroke 434.91 / I63.9  History:         Patient has prior history of Echocardiogram examinations, most                  recent 05/19/2023. Risk Factors:Hypertension and Dyslipidemia.                  Anxiety.  Sonographer:     Christopher Furnace Referring Phys:  [id number] DELAYNE LULLA SOLIAN Diagnosing Phys: Caron Poser IMPRESSIONS  1. Technically difficult study.  2. Left ventricular ejection fraction, by estimation, is 60 to 65%. The left ventricle has normal function. Left ventricular endocardial border not optimally defined to evaluate regional wall motion. There is mild asymmetric left ventricular hypertrophy. Left ventricular diastolic parameters are consistent with Grade I diastolic dysfunction (impaired relaxation).  3. Right ventricular systolic function grossly normal. The right ventricular size is not well visualized. Tricuspid regurgitation signal is inadequate for assessing PA pressure.  4. The aortic valve is tricuspid. Aortic valve regurgitation is not visualized. No aortic stenosis is present. Comparison(s): A prior study was performed on 05/19/2023. No significant change from prior study. Conclusion(s)/Recommendation(s): Normal biventricular function without evidence of hemodynamically significant valvular heart disease. FINDINGS  Left Ventricle: Left ventricular ejection fraction, by estimation, is 60 to 65%. The left ventricle has normal function. Left ventricular endocardial border not optimally defined to evaluate regional wall motion. The left ventricular internal cavity size was normal in size. There is mild asymmetric left ventricular hypertrophy. Left ventricular diastolic parameters are consistent with Grade I diastolic dysfunction (impaired relaxation). Right Ventricle: The  right ventricular size is not well visualized. Right vetricular wall thickness was not well visualized. Right ventricular systolic function grossly normal. Tricuspid regurgitation signal is inadequate for assessing PA pressure. Left Atrium: Left atrial size was normal in size. Right Atrium: Right atrial size was mildly dilated. Pericardium: There is no evidence of pericardial effusion. Mitral Valve: The mitral  valve is normal in structure. Trivial mitral valve regurgitation. No evidence of mitral valve stenosis. MV peak gradient, 5.1 mmHg. The mean mitral valve gradient is 2.0 mmHg. Tricuspid Valve: The tricuspid valve is normal in structure. Tricuspid valve regurgitation is mild . No evidence of tricuspid stenosis. Aortic Valve: The aortic valve is tricuspid. Aortic valve regurgitation is not visualized. No aortic stenosis is present. Aortic valve mean gradient measures 2.0 mmHg. Aortic valve peak gradient measures 3.0 mmHg. Aortic valve area, by VTI measures 4.47 cm. Pulmonic Valve: The pulmonic valve was not well visualized. Pulmonic valve regurgitation is trivial. No evidence of pulmonic stenosis. Aorta: The aortic root and ascending aorta are structurally normal, with no evidence of dilitation. Venous: IVC assessment for right atrial pressure unable to be performed due to mechanical ventilation. IAS/Shunts: The interatrial septum was not well visualized.  LEFT VENTRICLE PLAX 2D LVIDd:         4.67 cm   Diastology LVIDs:         2.53 cm   LV e' medial:    4.35 cm/s LV PW:         0.97 cm   LV E/e' medial:  17.1 LV IVS:        1.20 cm   LV e' lateral:   7.83 cm/s LVOT diam:     2.20 cm   LV E/e' lateral: 9.5 LV SV:         84 LV SV Index:   36 LVOT Area:     3.80 cm LV IVRT:       86 msec  RIGHT VENTRICLE RV Basal diam:  2.88 cm RV Mid diam:    2.95 cm LEFT ATRIUM             Index        RIGHT ATRIUM           Index LA diam:        2.70 cm 1.16 cm/m   RA Area:     17.80 cm LA Vol (A2C):   49.8 ml 21.33 ml/m   RA Volume:   47.00 ml  20.13 ml/m LA Vol (A4C):   27.3 ml 11.69 ml/m LA Biplane Vol: 37.3 ml 15.97 ml/m  AORTIC VALVE AV Area (Vmax):    4.27 cm AV Area (Vmean):   4.27 cm AV Area (VTI):     4.47 cm AV Vmax:           86.10 cm/s AV Vmean:          61.800 cm/s AV VTI:            0.188 m AV Peak Grad:      3.0 mmHg AV Mean Grad:      2.0 mmHg LVOT Vmax:         96.80 cm/s LVOT Vmean:        69.500 cm/s LVOT VTI:          0.221 m LVOT/AV VTI ratio: 1.18  AORTA Ao Root diam: 3.60 cm MITRAL VALVE                TRICUSPID VALVE MV Area (PHT): 3.03 cm     TR Peak grad:   5.3 mmHg MV Area VTI:   3.26 cm     TR Vmax:        115.00 cm/s MV Peak grad:  5.1 mmHg MV Mean grad:  2.0 mmHg     SHUNTS MV Vmax:  1.13 m/s     Systemic VTI:  0.22 m MV Vmean:      62.1 cm/s    Systemic Diam: 2.20 cm MV Decel Time: 250 msec MV E velocity: 74.20 cm/s MV A velocity: 112.00 cm/s MV E/A ratio:  0.66 Caron Poser Electronically signed by Caron Poser Signature Date/Time: 12/10/2023/10:45:44 AM    Final    CT Angio Chest PE W and/or Wo Contrast Result Date: 12/10/2023 CLINICAL DATA:  Suspected pulmonary embolism. EXAM: CT ANGIOGRAPHY CHEST WITH CONTRAST TECHNIQUE: Multidetector CT imaging of the chest was performed using the standard protocol during bolus administration of intravenous contrast. Multiplanar CT image reconstructions and MIPs were obtained to evaluate the vascular anatomy. RADIATION DOSE REDUCTION: This exam was performed according to the departmental dose-optimization program which includes automated exposure control, adjustment of the mA and/or kV according to patient size and/or use of iterative reconstruction technique. CONTRAST:  80mL OMNIPAQUE IOHEXOL 350 MG/ML SOLN COMPARISON:  None Available. FINDINGS: Cardiovascular: Satisfactory opacification of the pulmonary arteries to the segmental level. No evidence of pulmonary embolism. Normal heart size with moderate severity coronary artery calcification. No  pericardial effusion. Mediastinum/Nodes: No enlarged mediastinal, hilar, or axillary lymph nodes. Thyroid  gland, trachea, and esophagus demonstrate no significant findings. Lungs/Pleura: Diffuse bilateral interstitial thickening is seen. Mild bilateral atelectatic changes are, slightly more prominent within the bilateral lower lobes. A 2 mm anterolateral right upper lobe pulmonary nodule is present (axial CT image 65, CT series 5). No pleural effusion or pneumothorax is identified. Upper Abdomen: Punctate parenchymal calcifications are seen scattered throughout the spleen. Musculoskeletal: No chest wall abnormality. No acute or significant osseous findings. Review of the MIP images confirms the above findings. IMPRESSION: 1. No evidence of pulmonary embolism. 2. Diffuse bilateral interstitial thickening which may represent sequelae associated with interstitial edema. 3. 2 mm anterolateral right upper lobe pulmonary nodule. No follow-up needed if patient is low-risk.This recommendation follows the consensus statement: Guidelines for Management of Incidental Pulmonary Nodules Detected on CT Images: From the Fleischner Society 2017; Radiology 2017; 284:228-243. 4. Moderate severity coronary artery calcification. 5. Evidence of prior granulomatous disease. Electronically Signed   By: Suzen Dials M.D.   On: 12/10/2023 01:04   CT Cervical Spine Wo Contrast Result Date: 12/09/2023 EXAM: CT CERVICAL SPINE WITHOUT CONTRAST 12/09/2023 08:39:45 PM TECHNIQUE: CT of the cervical spine was performed without the administration of intravenous contrast. Multiplanar reformatted images are provided for review. Automated exposure control, iterative reconstruction, and/or weight based adjustment of the mA/kV was utilized to reduce the radiation dose to as low as reasonably achievable. COMPARISON: None available. CLINICAL HISTORY: Neck trauma (Age >= 65y). FINDINGS: CERVICAL SPINE: BONES AND ALIGNMENT: No acute fracture or  traumatic malalignment. DEGENERATIVE CHANGES: Multilevel spondylosis. Partial ankylosis of the C4-C6 vertebral bodies. No severe spinal canal narrowing. SOFT TISSUES: No prevertebral soft tissue swelling. IMPRESSION: 1. No acute abnormality of the cervical spine. Electronically signed by: Norman Gatlin MD 12/09/2023 08:44 PM EST RP Workstation: [id number]   CT Head Wo Contrast Result Date: 12/09/2023 EXAM: CT HEAD WITHOUT CONTRAST 12/09/2023 08:39:45 PM TECHNIQUE: CT of the head was performed without the administration of intravenous contrast. Automated exposure control, iterative reconstruction, and/or weight based adjustment of the mA/kV was utilized to reduce the radiation dose to as low as reasonably achievable. COMPARISON: None available. CLINICAL HISTORY: Head trauma, minor (Age >= 65y). FINDINGS: BRAIN AND VENTRICLES: No acute hemorrhage. No evidence of acute infarct. No hydrocephalus. No extra-axial collection. No mass effect or midline shift. Patchy and  confluent decreased attenuation throughout deep and periventricular white matter of cerebral hemispheres bilaterally, compatible with chronic microvascular ischemic disease. ORBITS: No acute abnormality. Bilateral lens replacement. SINUSES: No acute abnormality. SOFT TISSUES AND SKULL: No acute soft tissue abnormality. No skull fracture. Right parieto-occipital subcutaneous soft tissue hematoma. IMPRESSION: 1. No acute intracranial abnormality. 2. Right parieto-occipital subcutaneous soft tissue hematoma. Electronically signed by: Norman Gatlin MD 12/09/2023 08:42 PM EST RP Workstation: [id number]    Microbiology: Results for orders placed or performed in visit on 08/29/20  Fecal occult blood, imunochemical     Status: Abnormal   Collection Time: 08/29/20  8:42 AM   Specimen: Stool  Result Value Ref Range Status   Fecal Occult Bld Positive (A) Negative Final   *Note: Due to a large number of results and/or encounters for the requested time  period, some results have not been displayed. A complete set of results can be found in Results Review.    Labs: CBC: Recent Labs  Lab 12/09/23 1510 12/10/23 0324 12/11/23 0752  WBC 12.7* 8.7 8.2  HGB 13.2 13.7 13.2  HCT 42.4 43.2 42.4  MCV 92.2 90.4 92.4  PLT 200 213 202   Basic Metabolic Panel: Recent Labs  Lab 12/09/23 1510 12/10/23 0324  NA 139 138  K 3.8 4.4  CL 103 105  CO2 25 26  GLUCOSE 185* 234*  BUN 27* 24*  CREATININE 1.72* 1.13  CALCIUM  7.9* 8.4*   Liver Function Tests: Recent Labs  Lab 12/09/23 1510  AST 26  ALT 19  ALKPHOS 68  BILITOT 0.6  PROT 7.1  ALBUMIN 3.1*   CBG: No results for input(s): GLUCAP in the last 168 hours.  Discharge time spent: less than 30 minutes.  Signed: Terral DELENA Seashore, MD [facility] 12/11/2023

## 2023-12-11 NOTE — Plan of Care (Signed)
  Problem: Education: Goal: Knowledge of General Education information will improve Description: Including pain rating scale, medication(s)/side effects and non-pharmacologic comfort measures Outcome: Progressing   Problem: Health Behavior/Discharge Planning: Goal: Ability to manage health-related needs will improve Outcome: Progressing   Problem: Clinical Measurements: Goal: Ability to maintain clinical measurements within normal limits will improve Outcome: Progressing Goal: Will remain free from infection Outcome: Progressing Goal: Diagnostic test results will improve Outcome: Progressing Goal: Respiratory complications will improve Outcome: Progressing Goal: Cardiovascular complication will be avoided Outcome: Progressing   Problem: Activity: Goal: Risk for activity intolerance will decrease Outcome: Progressing   Problem: Nutrition: Goal: Adequate nutrition will be maintained Outcome: Progressing   Problem: Coping: Goal: Level of anxiety will decrease Outcome: Progressing   Problem: Elimination: Goal: Will not experience complications related to bowel motility Outcome: Progressing Goal: Will not experience complications related to urinary retention Outcome: Progressing   Problem: Pain Managment: Goal: General experience of comfort will improve and/or be controlled Outcome: Progressing   Problem: Safety: Goal: Ability to remain free from injury will improve Outcome: Progressing   Problem: Skin Integrity: Goal: Risk for impaired skin integrity will decrease Outcome: Progressing   Problem: Education: Goal: Understanding of cardiac disease, CV risk reduction, and recovery process will improve Outcome: Progressing Goal: Individualized Educational Video(s) Outcome: Progressing   Problem: Activity: Goal: Ability to tolerate increased activity will improve Outcome: Progressing   Problem: Cardiac: Goal: Ability to achieve and maintain adequate cardiovascular  perfusion will improve Outcome: Progressing   Problem: Health Behavior/Discharge Planning: Goal: Ability to safely manage health-related needs after discharge will improve Outcome: Progressing   Problem: Education: Goal: Ability to describe self-care measures that may prevent or decrease complications (Diabetes Survival Skills Education) will improve Outcome: Progressing Goal: Individualized Educational Video(s) Outcome: Progressing   Problem: Coping: Goal: Ability to adjust to condition or change in health will improve Outcome: Progressing   Problem: Fluid Volume: Goal: Ability to maintain a balanced intake and output will improve Outcome: Progressing   Problem: Health Behavior/Discharge Planning: Goal: Ability to identify and utilize available resources and services will improve Outcome: Progressing Goal: Ability to manage health-related needs will improve Outcome: Progressing   Problem: Metabolic: Goal: Ability to maintain appropriate glucose levels will improve Outcome: Progressing   Problem: Nutritional: Goal: Maintenance of adequate nutrition will improve Outcome: Progressing Goal: Progress toward achieving an optimal weight will improve Outcome: Progressing   Problem: Skin Integrity: Goal: Risk for impaired skin integrity will decrease Outcome: Progressing   Problem: Tissue Perfusion: Goal: Adequacy of tissue perfusion will improve Outcome: Progressing

## 2023-12-11 NOTE — Plan of Care (Signed)
  Problem: Education: Goal: Knowledge of General Education information will improve Description: Including pain rating scale, medication(s)/side effects and non-pharmacologic comfort measures Outcome: Progressing   Problem: Health Behavior/Discharge Planning: Goal: Ability to manage health-related needs will improve Outcome: Progressing   Problem: Clinical Measurements: Goal: Diagnostic test results will improve Outcome: Progressing   Problem: Activity: Goal: Risk for activity intolerance will decrease Outcome: Progressing   Problem: Pain Managment: Goal: General experience of comfort will improve and/or be controlled Outcome: Progressing   Problem: Safety: Goal: Ability to remain free from injury will improve Outcome: Progressing

## 2023-12-11 NOTE — Progress Notes (Signed)
 CBG on dexcom - 178mg /dl

## 2023-12-13 ENCOUNTER — Telehealth: Payer: Self-pay

## 2023-12-13 ENCOUNTER — Ambulatory Visit (HOSPITAL_BASED_OUTPATIENT_CLINIC_OR_DEPARTMENT_OTHER): Admitting: Nurse Practitioner

## 2023-12-13 DIAGNOSIS — Z91199 Patient's noncompliance with other medical treatment and regimen due to unspecified reason: Secondary | ICD-10-CM

## 2023-12-13 DIAGNOSIS — G894 Chronic pain syndrome: Secondary | ICD-10-CM

## 2023-12-13 NOTE — Telephone Encounter (Signed)
 Opened in error

## 2023-12-13 NOTE — Progress Notes (Signed)
 12/13/2023-No Show

## 2023-12-14 NOTE — Telephone Encounter (Signed)
 LVM to schedule and 2 to 3 weeks follow up

## 2023-12-20 ENCOUNTER — Ambulatory Visit: Admitting: Internal Medicine

## 2023-12-22 ENCOUNTER — Other Ambulatory Visit: Payer: Self-pay | Admitting: Internal Medicine

## 2023-12-22 DIAGNOSIS — E119 Type 2 diabetes mellitus without complications: Secondary | ICD-10-CM

## 2023-12-23 ENCOUNTER — Other Ambulatory Visit: Payer: Self-pay

## 2023-12-24 ENCOUNTER — Ambulatory Visit: Admitting: Internal Medicine

## 2023-12-24 MED ORDER — METFORMIN HCL 1000 MG PO TABS: 500.0000 mg | ORAL_TABLET | Freq: Two times a day (BID) | ORAL | 1 refills | Status: AC

## 2023-12-28 ENCOUNTER — Ambulatory Visit: Attending: Nurse Practitioner | Admitting: Nurse Practitioner

## 2023-12-28 ENCOUNTER — Encounter: Payer: Self-pay | Admitting: Nurse Practitioner

## 2023-12-28 DIAGNOSIS — G8929 Other chronic pain: Secondary | ICD-10-CM | POA: Insufficient documentation

## 2023-12-28 DIAGNOSIS — Z79891 Long term (current) use of opiate analgesic: Secondary | ICD-10-CM | POA: Insufficient documentation

## 2023-12-28 DIAGNOSIS — Z79899 Other long term (current) drug therapy: Secondary | ICD-10-CM | POA: Diagnosis present

## 2023-12-28 DIAGNOSIS — G894 Chronic pain syndrome: Secondary | ICD-10-CM | POA: Diagnosis not present

## 2023-12-28 DIAGNOSIS — M79604 Pain in right leg: Secondary | ICD-10-CM | POA: Insufficient documentation

## 2023-12-28 DIAGNOSIS — M25562 Pain in left knee: Secondary | ICD-10-CM | POA: Diagnosis present

## 2023-12-28 DIAGNOSIS — M542 Cervicalgia: Secondary | ICD-10-CM | POA: Diagnosis present

## 2023-12-28 DIAGNOSIS — M5030 Other cervical disc degeneration, unspecified cervical region: Secondary | ICD-10-CM | POA: Insufficient documentation

## 2023-12-28 DIAGNOSIS — M545 Low back pain, unspecified: Secondary | ICD-10-CM | POA: Diagnosis not present

## 2023-12-28 DIAGNOSIS — M25551 Pain in right hip: Secondary | ICD-10-CM | POA: Diagnosis present

## 2023-12-28 DIAGNOSIS — M47816 Spondylosis without myelopathy or radiculopathy, lumbar region: Secondary | ICD-10-CM | POA: Diagnosis present

## 2023-12-28 DIAGNOSIS — Z96641 Presence of right artificial hip joint: Secondary | ICD-10-CM | POA: Diagnosis present

## 2023-12-28 DIAGNOSIS — M51362 Other intervertebral disc degeneration, lumbar region with discogenic back pain and lower extremity pain: Secondary | ICD-10-CM | POA: Diagnosis present

## 2023-12-28 DIAGNOSIS — M79605 Pain in left leg: Secondary | ICD-10-CM | POA: Insufficient documentation

## 2023-12-28 DIAGNOSIS — M25561 Pain in right knee: Secondary | ICD-10-CM | POA: Insufficient documentation

## 2023-12-28 MED ORDER — MORPHINE SULFATE ER 15 MG PO TBCR: 15.0000 mg | EXTENDED_RELEASE_TABLET | Freq: Three times a day (TID) | ORAL | 0 refills | Status: AC

## 2023-12-28 NOTE — Progress Notes (Signed)
Nursing Pain Medication Assessment:  Safety precautions to be maintained throughout the outpatient stay will include: orient to surroundings, keep bed in low position, maintain call bell within reach at all times, provide assistance with transfer out of bed and ambulation.  Medication Inspection Compliance: Alan Schmidt did not comply with our request to bring his pills to be counted. He was reminded that bringing the medication bottles, even when empty, is a requirement.  Medication: None brought in. Pill/Patch Count: None available to be counted. Bottle Appearance: No container available. Did not bring bottle(s) to appointment. Filled Date: N/A Last Medication intake:  Today 

## 2023-12-28 NOTE — Progress Notes (Signed)
 PROVIDER NOTE: Interpretation of information contained herein should be left to medically-trained personnel. Specific patient instructions are provided elsewhere under Patient Instructions section of medical record. This document was created in part using AI and STT-dictation technology, any transcriptional errors that may result from this process are unintentional.  Patient: Alan Schmidt  Service: E/M   PCP: [name], MD  DOB: 1946-01-27  DOS: 12/28/2023  Provider: Emmy MARLA Blanch, NP  MRN: [id number]  Delivery: Face-to-face  Specialty: Interventional Pain Management  Type: Established Patient  Setting: Ambulatory outpatient facility  Specialty designation: 09  Referring Prov.: [name], MD  Location: Outpatient office facility       History of present illness (HPI) [name], a 78 y.o. year old male, is here today because of his back pain, right hip pain. Alan Schmidt primary complain today is Back Pain and Hip Pain (right)  Pertinent problems: Alan Schmidt has Chronic low back pain (Bilateral) (R>L) w/ sciatica (Bilateral); Lumbar facet syndrome (Bilateral) (R>L); Lumbar spondylosis; Diabetic peripheral neuropathy (HCC); Long term current use of opiate analgesic; Opiate use (60 MME/Day); Opiate dependence (HCC); Chronic neck pain (midline over the C7 spinous processes) (L>R); Neurogenic pain; Neuropathic pain; Chronic lower extremity pain (2ry area of Pain) (Bilateral) (R>L); 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) (Right L5 dermatome); History of total hip replacement (Right); Chronic hip pain (Right); Class I Morbid obesity (HCC) (68% higher incidence of chronic low back pain); Chronic kidney disease (CKD); Chronic shoulder pain (Bilateral) (R>L); Occipital neuralgia (Left); Cervicogenic headache (Left); Chronic shoulder radicular pain (Left); Chronic cervical radicular pain (Left); Chronic pain syndrome; Opioid-induced constipation (OIC); Lumbar facet joint  osteoarthritis (Bilateral); DDD (degenerative disc disease), lumbar; and Pharmacologic therapy on their pertinent problem list.  Pain Assessment: Severity of Chronic pain is reported as a 5 /10. Location: Back Lower/radites into right hip. Onset: More than a month ago. Quality: Aching, Headache. Timing: Constant. Modifying factor(s):  SABRA Vitals:  height is 6' (1.829 m) and weight is 240 lb (108.9 kg). His temperature is 97.2 F (36.2 C) (abnormal). His blood pressure is 103/60 and his pulse is 70. His oxygen saturation is 95%.  BMI: Estimated body mass index is 32.55 kg/m as calculated from the following:   Height as of this encounter: 6' (1.829 m).   Weight as of this encounter: 240 lb (108.9 kg).  Last encounter: 12/13/2023. Last procedure: Visit date not found.  Reason for encounter: medication management.  The patient indicates doing well with current medication regimen.  No side effects or adverse reaction reported to medication.  Discussed the use of AI scribe software for clinical note transcription with the patient, who gave verbal consent to proceed.  History of Present Illness   Alan Schmidt is a 78 year old male who presents with a recent fall resulting in head trauma.  He experienced a fall at home, resulting in head trauma with significant swelling and bruising. The swelling has reduced, and most of the bruise has resolved, though some soreness persists. He does not recall the fall itself but remembers lying on the floor afterward. Emergency services were called by his housekeeper, and he was taken to the hospital.  During his hospital stay, a CT scan of the head showed no acute intracranial abnormality however, CT showed right parieto-occipital subcutaneous soft tissue hematoma. An ultrasound related to cardiac evaluation was performed.   He is currently taking morphine  three times a day for pain management without side effects. He  has concerns about the vagus nerve's role in  his dizziness and syncope, though he reports no known issues with low blood pressure.  His blood pressure is usually slightly higher than 103/60.     Pharmacotherapy Assessment   Morphine  (MS Contin ) 15 mg every 8 hours as needed for pain with a quantity of 90. MME=45 Monitoring: [place] PMP: PDMP reviewed during this encounter.       Pharmacotherapy: No side-effects or adverse reactions reported. Compliance: No problems identified. Effectiveness: Clinically acceptable.  Rebecka Wolm HERO, RN  12/28/2023  8:30 AM  Sign when Signing Visit Nursing Pain Medication Assessment:  Safety precautions to be maintained throughout the outpatient stay will include: orient to surroundings, keep bed in low position, maintain call bell within reach at all times, provide assistance with transfer out of bed and ambulation.  Medication Inspection Compliance: Alan Schmidt did not comply with our request to bring his pills to be counted. He was reminded that bringing the medication bottles, even when empty, is a requirement.  Medication: None brought in. Pill/Patch Count: None available to be counted. Bottle Appearance: No container available. Did not bring bottle(s) to appointment. Filled Date: N/A Last Medication intake:  Today    UDS:  Summary  Date Value Ref Range Status  09/20/2023 FINAL  Final    Comment:    ==================================================================== [employer] Select 13 (MW) ==================================================================== Test                             Result       Flag       Units  Drug Present and Declared for Prescription Verification   Alprazolam                      46           EXPECTED   ng/mg creat   Alpha-hydroxyalprazolam        103          EXPECTED   ng/mg creat    Source of alprazolam  is a scheduled prescription medication. Alpha-    hydroxyalprazolam is an expected metabolite of alprazolam .    Morphine                        >89361        EXPECTED   ng/mg creat   Normorphine                    250          EXPECTED   ng/mg creat    Potential sources of large amounts of morphine  in the absence of    codeine include administration of morphine  or use of heroin.     Normorphine is an expected metabolite of morphine .    Hydromorphone                  81           EXPECTED   ng/mg creat    Hydromorphone may be present as a metabolite of morphine ;    concentrations of hydromorphone rarely exceed 5% of the morphine     concentration when this is the source of hydromorphone.  ==================================================================== Test                      Result    Flag   Units      Ref Range  Creatinine              94               mg/dL      >=79 ==================================================================== Declared Medications:  The flagging and interpretation on this report are based on the  following declared medications.  Unexpected results may arise from  inaccuracies in the declared medications.   **Note: The testing scope of this panel includes these medications:   Alprazolam  (Xanax )  Morphine  (MS Contin )   **Note: The testing scope of this panel does not include the  following reported medications:   Acetaminophen  (Tylenol )  Aspirin   Bupropion  (Wellbutrin )  Carvedilol  (Coreg )  Desoximetasone  (Topicort )  Empagliflozin  (Jardiance )  Epinephrine  (EpiPen )  Escitalopram  (Lexapro )  Gabapentin  (Neurontin )  Hydrochlorothiazide  (Hydrodiuril )  Insulin  (Lantus )  Loratadine  (Claritin )  Lubiprostone  (Amitiza )  Metformin  (Glucophage )  Methocarbamol  (Robaxin )  Mometasone  (Nasonex )  Naloxone  (Narcan )  Olmesartan  (Benicar )  Pantoprazole  (Protonix )  Rosuvastatin  (Crestor )  Sennosides (Senokot)  Supplement  Tamsulosin  (Flomax ) ==================================================================== For clinical consultation, please call [phone]. ====================================================================     No results found for: CBDTHCR No results found for: D8THCCBX No results found for: D9THCCBX  ROS  Constitutional: Denies any fever or chills Gastrointestinal: No reported hemesis, hematochezia, vomiting, or acute GI distress Musculoskeletal: low back pain, right hip pain Neurological: No reported episodes of acute onset apraxia, aphasia, dysarthria, agnosia, amnesia, paralysis, loss of coordination, or loss of consciousness  Medication Review  ALPRAZolam , Benefiber, EPINEPHrine , FreeStyle Libre 14 Day Reader, FreeStyle Libre 14 Day Sensor, Insulin  Pen Needle, Pen Needles, aspirin  EC, buPROPion , carvedilol , desoximetasone , empagliflozin , escitalopram , gabapentin , glucose blood, insulin  degludec, insulin  lispro, loratadine , lubiprostone , meloxicam , metFORMIN , methocarbamol , mometasone , morphine , naloxone , olmesartan , pantoprazole , rosuvastatin , sennosides-docusate sodium, and tamsulosin   History Review  Allergy: Alan Schmidt is allergic to contrast media [iodinated contrast media], iodine, and shellfish allergy. Drug: Alan Schmidt  reports no history of drug use. Alcohol:  reports no history of alcohol use. Tobacco:  reports that he has quit smoking. His smoking use included cigarettes. He has never used smokeless tobacco. Social: Mr. Cloninger  reports that he has quit smoking. His smoking use included cigarettes. He has never used smokeless tobacco. He reports that he does not drink alcohol and does not use drugs. Medical:  has a past medical history of Acute postoperative pain (11/24/2016), Anxiety, Chronic hip pain (Right) (12/05/2014), Chronic lumbar pain, Depression, Diabetes mellitus without complication (HCC), Hyperlipidemia, Hypertension, Kidney stones, and Migraines. Surgical: Mr. Brue  has a past surgical history that includes Total hip arthroplasty; Tonsillectomy; and right hip  surgery. Family: family history includes Cancer in his mother; Diabetes in his father, sister, and sister; Heart disease in his father; Stroke in his father.  Laboratory Chemistry Profile   Renal Lab Results  Component Value Date   BUN 24 (H) 12/10/2023   CREATININE 1.13 12/10/2023   BCR 13 07/21/2023   GFR 84.35 09/22/2022   GFRAA >60 03/26/2018   GFRNONAA >60 12/10/2023    Hepatic Lab Results  Component Value Date   AST 26 12/09/2023   ALT 19 12/09/2023   ALBUMIN 3.1 (L) 12/09/2023   ALKPHOS 68 12/09/2023    Electrolytes Lab Results  Component Value Date   NA 138 12/10/2023   K 4.4 12/10/2023   CL 105 12/10/2023   CALCIUM  8.4 (L) 12/10/2023   MG 1.8 03/06/2015    Bone No results found for: VD25OH, VD125OH2TOT, CI6874NY7, CI7874NY7, 25OHVITD1, 25OHVITD2, 25OHVITD3, TESTOFREE, TESTOSTERONE  Inflammation (CRP: Acute Phase) (ESR: Chronic  Phase) Lab Results  Component Value Date   CRP 0.5 03/06/2015   ESRSEDRATE 45 (H) 12/14/2016         Note: Above Lab results reviewed.  Recent Imaging Review  US  Carotid Bilateral EXAM: US  CAROTID DUPLEX 12/10/2023 10:30:26 AM  TECHNIQUE: Real-time grayscale, color flow and spectral Doppler sonographic images were obtained of the extracranial carotid system using a linear transducer.  COMPARISON: None available  CLINICAL HISTORY: Syncope and collapse. Hypertension.  FINDINGS:  RIGHT: Common carotid artery: 92 cm/s  Internal carotid artery: 104 cm/s  External carotid artery: 126 cm/s  Right ICA/CCA ratio: 1.1  Plaque: None  Vertebral artery: Antegrade  LEFT: Common carotid artery: 91 cm/s  Internal carotid artery: 72.8 cm/s  External carotid artery: 97 cm/s  LEFT ICA/CCA ratio: 0.8  Plaque: None  Vertebral artery: Antegrade  Normal carotid waveforms are present throughout. There is no significant occlusive disease.  IMPRESSION: 1. No hemodynamically significant  stenosis (greater than 50%) within the extracranial internal carotid arteries.  Electronically signed by: Dayne Hassell MD 12/10/2023 11:17 AM EST RP Workstation: [id number] ECHOCARDIOGRAM COMPLETE    ECHOCARDIOGRAM REPORT       Patient Name:   Alan Schmidt Date of Exam: 12/10/2023 Medical Rec #:  [id number]        Height:       73.0 in Accession #:    [id number]       Weight:       242.5 lb Date of Birth:  Aug 20, 1945        BSA:          2.335 m Patient Age:    78 years         BP:           143/81 mmHg Patient Gender: M                HR:           82 bpm. Exam Location:  [facility]  Procedure: 2D Echo, Cardiac Doppler and Color Doppler (Both Spectral and Color            Flow Doppler were utilized during procedure).  Indications:     Stroke 434.91 / I63.9   History:         Patient has prior history of Echocardiogram examinations, most                  recent 05/19/2023. Risk Factors:Hypertension and Dyslipidemia.                  Anxiety.   Sonographer:     Christopher Furnace Referring Phys:  [id number] DELAYNE LULLA SOLIAN Diagnosing Phys: Caron Poser  IMPRESSIONS   1. Technically difficult study.  2. Left ventricular ejection fraction, by estimation, is 60 to 65%. The left ventricle has normal function. Left ventricular endocardial border not optimally defined to evaluate regional wall motion. There is mild asymmetric left ventricular  hypertrophy. Left ventricular diastolic parameters are consistent with Grade I diastolic dysfunction (impaired relaxation).  3. Right ventricular systolic function grossly normal. The right ventricular size is not well visualized. Tricuspid regurgitation signal is inadequate for assessing PA pressure.  4. The aortic valve is tricuspid. Aortic valve regurgitation is not visualized. No aortic stenosis is present.  Comparison(s): A prior study was performed on 05/19/2023. No significant change from prior study.  Conclusion(s)/Recommendation(s): Normal  biventricular function without evidence of hemodynamically significant valvular heart disease.  FINDINGS  Left Ventricle: Left ventricular ejection fraction,  by estimation, is 60 to 65%. The left ventricle has normal function. Left ventricular endocardial border not optimally defined to evaluate regional wall motion. The left ventricular internal cavity  size was normal in size. There is mild asymmetric left ventricular hypertrophy. Left ventricular diastolic parameters are consistent with Grade I diastolic dysfunction (impaired relaxation).  Right Ventricle: The right ventricular size is not well visualized. Right vetricular wall thickness was not well visualized. Right ventricular systolic function grossly normal. Tricuspid regurgitation signal is inadequate for assessing PA pressure.  Left Atrium: Left atrial size was normal in size.  Right Atrium: Right atrial size was mildly dilated.  Pericardium: There is no evidence of pericardial effusion.  Mitral Valve: The mitral valve is normal in structure. Trivial mitral valve regurgitation. No evidence of mitral valve stenosis. MV peak gradient, 5.1 mmHg. The mean mitral valve gradient is 2.0 mmHg.  Tricuspid Valve: The tricuspid valve is normal in structure. Tricuspid valve regurgitation is mild . No evidence of tricuspid stenosis.  Aortic Valve: The aortic valve is tricuspid. Aortic valve regurgitation is not visualized. No aortic stenosis is present. Aortic valve mean gradient measures 2.0 mmHg. Aortic valve peak gradient measures 3.0 mmHg. Aortic valve area, by VTI measures 4.47  cm.  Pulmonic Valve: The pulmonic valve was not well visualized. Pulmonic valve regurgitation is trivial. No evidence of pulmonic stenosis.  Aorta: The aortic root and ascending aorta are structurally normal, with no evidence of dilitation.  Venous: IVC assessment for right atrial pressure unable to be performed due to mechanical ventilation.  IAS/Shunts: The  interatrial septum was not well visualized.    LEFT VENTRICLE PLAX 2D LVIDd:         4.67 cm   Diastology LVIDs:         2.53 cm   LV e' medial:    4.35 cm/s LV PW:         0.97 cm   LV E/e' medial:  17.1 LV IVS:        1.20 cm   LV e' lateral:   7.83 cm/s LVOT diam:     2.20 cm   LV E/e' lateral: 9.5 LV SV:         84 LV SV Index:   36 LVOT Area:     3.80 cm LV IVRT:       86 msec    RIGHT VENTRICLE RV Basal diam:  2.88 cm RV Mid diam:    2.95 cm  LEFT ATRIUM             Index        RIGHT ATRIUM           Index LA diam:        2.70 cm 1.16 cm/m   RA Area:     17.80 cm LA Vol (A2C):   49.8 ml 21.33 ml/m  RA Volume:   47.00 ml  20.13 ml/m LA Vol (A4C):   27.3 ml 11.69 ml/m LA Biplane Vol: 37.3 ml 15.97 ml/m  AORTIC VALVE AV Area (Vmax):    4.27 cm AV Area (Vmean):   4.27 cm AV Area (VTI):     4.47 cm AV Vmax:           86.10 cm/s AV Vmean:          61.800 cm/s AV VTI:            0.188 m AV Peak Grad:      3.0 mmHg AV Mean Grad:  2.0 mmHg LVOT Vmax:         96.80 cm/s LVOT Vmean:        69.500 cm/s LVOT VTI:          0.221 m LVOT/AV VTI ratio: 1.18   AORTA Ao Root diam: 3.60 cm  MITRAL VALVE                TRICUSPID VALVE MV Area (PHT): 3.03 cm     TR Peak grad:   5.3 mmHg MV Area VTI:   3.26 cm     TR Vmax:        115.00 cm/s MV Peak grad:  5.1 mmHg MV Mean grad:  2.0 mmHg     SHUNTS MV Vmax:       1.13 m/s     Systemic VTI:  0.22 m MV Vmean:      62.1 cm/s    Systemic Diam: 2.20 cm MV Decel Time: 250 msec MV E velocity: 74.20 cm/s MV A velocity: 112.00 cm/s MV E/A ratio:  0.66  Caron Poser Electronically signed by Caron Poser Signature Date/Time: 12/10/2023/10:45:44 AM      Final   CT Angio Chest PE W and/or Wo Contrast CLINICAL DATA:  Suspected pulmonary embolism.  EXAM: CT ANGIOGRAPHY CHEST WITH CONTRAST  TECHNIQUE: Multidetector CT imaging of the chest was performed using the standard protocol during bolus administration of  intravenous contrast. Multiplanar CT image reconstructions and MIPs were obtained to evaluate the vascular anatomy.  RADIATION DOSE REDUCTION: This exam was performed according to the departmental dose-optimization program which includes automated exposure control, adjustment of the mA and/or kV according to patient size and/or use of iterative reconstruction technique.  CONTRAST:  80mL OMNIPAQUE  IOHEXOL  350 MG/ML SOLN  COMPARISON:  None Available.  FINDINGS: Cardiovascular: Satisfactory opacification of the pulmonary arteries to the segmental level. No evidence of pulmonary embolism. Normal heart size with moderate severity coronary artery calcification. No pericardial effusion.  Mediastinum/Nodes: No enlarged mediastinal, hilar, or axillary lymph nodes. Thyroid  gland, trachea, and esophagus demonstrate no significant findings.  Lungs/Pleura: Diffuse bilateral interstitial thickening is seen. Mild bilateral atelectatic changes are, slightly more prominent within the bilateral lower lobes. A 2 mm anterolateral right upper lobe pulmonary nodule is present (axial CT image 65, CT series 5). No pleural effusion or pneumothorax is identified.  Upper Abdomen: Punctate parenchymal calcifications are seen scattered throughout the spleen.  Musculoskeletal: No chest wall abnormality. No acute or significant osseous findings.  Review of the MIP images confirms the above findings.  IMPRESSION: 1. No evidence of pulmonary embolism. 2. Diffuse bilateral interstitial thickening which may represent sequelae associated with interstitial edema. 3. 2 mm anterolateral right upper lobe pulmonary nodule. No follow-up needed if patient is low-risk.This recommendation follows the consensus statement: Guidelines for Management of Incidental Pulmonary Nodules Detected on CT Images: From the Fleischner Society 2017; Radiology 2017; 284:228-243. 4. Moderate severity coronary artery  calcification. 5. Evidence of prior granulomatous disease.  Electronically Signed   By: Suzen Dials M.D.   On: 12/10/2023 01:04 Note: Reviewed        Physical Exam  Vitals: BP 103/60   Pulse 70   Temp (!) 97.2 F (36.2 C)   Ht 6' (1.829 m)   Wt 240 lb (108.9 kg)   SpO2 95%   BMI 32.55 kg/m  BMI: Estimated body mass index is 32.55 kg/m as calculated from the following:   Height as of this encounter: 6' (1.829 m).   Weight as of  this encounter: 240 lb (108.9 kg). Ideal: Ideal body weight: 77.6 kg (171 lb 1.2 oz) Adjusted ideal body weight: 90.1 kg (198 lb 10.3 oz) General appearance: Well nourished, well developed, and well hydrated. In no apparent acute distress Mental status: Alert, oriented x 3 (person, place, & time)       Respiratory: No evidence of acute respiratory distress Eyes: PERLA  Musculoskeletal: + LBP Right hip pain Assessment   Diagnosis Status  1. Chronic low back pain (1ry area of Pain) (Bilateral) w/o sciatica   2. Chronic pain syndrome   3. Pharmacologic therapy   4. Chronic lower extremity pain (2ry area of Pain) (Bilateral) (R>L)   5. DDD (degenerative disc disease), cervical   6. Degeneration of intervertebral disc of lumbar region with discogenic back pain and lower extremity pain   7. Chronic neck pain (midline over the C7 spinous processes) (L>R)   8. Lumbar facet syndrome (Bilateral) (R>L)   9. Chronic knee pain (Bilateral)   10. 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)   11. Chronic use of opiate for therapeutic purpose    Controlled Controlled Controlled   Updated Problems: No problems updated.  Plan of Care  Problem-specific:  Assessment and Plan    Fall with head injury and resolved scalp hematoma CT scan showed no acute intracranial abnormalities. Scalp hematoma resolved, soreness persists. No concerns from imaging. - Advised to monitor for new symptoms such as dizziness or headache and seek emergency  evaluation if he occurs.  Syncope episode Experienced syncope with no recollection. Differential includes vasovagal syncope. Blood pressure 103/60, lower than baseline. No cardiac issues from recent tests. - Discuss blood pressure management and syncope with primary care provider. - Advised to sit down quickly if feeling dizzy to prevent falls.  Chronic pain syndrome with low back pain, cervicalgia, cervical and lumbar disc degeneration, lumbar spondylosis, and right lower extremity pain Chronic pain managed with morphine , three times daily. No side effects.  - Continue morphine  prescription for three months, three times daily. - Advised not to increase morphine  dosage due to potential exacerbation of dizziness and other issues.  Patient's pain is controlled with Morphine , will continue on current medication regimen.  Prescribing drug monitoring (PDMP) reviewed; findings consistent with the use of prescribed medication and no evidence of narcotic misuse or abuse. Urine drug screening (UDS) up to date. No side effects or adverse reaction reported to medication. Schedule follow up in 90 days for medication management.       [name] has a current medication list which includes the following long-term medication(s): bupropion , carvedilol , escitalopram , gabapentin , insulin  lispro, loratadine , lubiprostone , metformin , mometasone , naloxone , olmesartan , pantoprazole , rosuvastatin , benefiber, [START ON 12/29/2023] morphine , [START ON 01/28/2024] morphine , and [START ON 02/27/2024] morphine .  Pharmacotherapy (Medications Ordered): Meds ordered this encounter  Medications   morphine  (MS CONTIN ) 15 MG 12 hr tablet    Sig: Take 1 tablet (15 mg total) by mouth every 8 (eight) hours. Must last 30 days. Do not break tablet    Dispense:  90 tablet    Refill:  0    DO NOT: delete (not duplicate); no partial-fill (will deny script to complete), no refill request (F/U required). DISPENSE: 1 day early  if closed on fill date. WARN: No CNS-depressants within 8 hrs of med.   morphine  (MS CONTIN ) 15 MG 12 hr tablet    Sig: Take 1 tablet (15 mg total) by mouth every 8 (eight) hours. Must last 30  days. Do not break tablet    Dispense:  90 tablet    Refill:  0    DO NOT: delete (not duplicate); no partial-fill (will deny script to complete), no refill request (F/U required). DISPENSE: 1 day early if closed on fill date. WARN: No CNS-depressants within 8 hrs of med.   morphine  (MS CONTIN ) 15 MG 12 hr tablet    Sig: Take 1 tablet (15 mg total) by mouth every 8 (eight) hours. Must last 30 days. Do not break tablet    Dispense:  90 tablet    Refill:  0    DO NOT: delete (not duplicate); no partial-fill (will deny script to complete), no refill request (F/U required). DISPENSE: 1 day early if closed on fill date. WARN: No CNS-depressants within 8 hrs of med.   Orders:  No orders of the defined types were placed in this encounter.       Return in about 3 months (around 03/29/2024) for (F2F), (MM), Emmy Blanch NP.    Recent Visits No visits were found meeting these conditions. Showing recent visits within past 90 days and meeting all other requirements Today's Visits Date Type Provider Dept  12/28/23 Office Visit Ledford Goodson K, NP [facility]  Showing today's visits and meeting all other requirements Future Appointments Date Type Provider Dept  03/27/24 Appointment Stoy Fenn K, NP [facility]  Showing future appointments within next 90 days and meeting all other requirements  I discussed the assessment and treatment plan with the patient. The patient was provided an opportunity to ask questions and all were answered. The patient agreed with the plan and demonstrated an understanding of the instructions.  Patient advised to call back or seek an in-person evaluation if the symptoms or condition worsens.  I personally spent a total of 30 minutes in the care of the patient  today including preparing to see the patient, getting/reviewing separately obtained history, performing a medically appropriate exam/evaluation, counseling and educating, placing orders, referring and communicating with other health care professionals, documenting clinical information in the EHR, independently interpreting results, communicating results, and coordinating care.   Note by: Jamair Cato K Britten Seyfried, NP (TTS and AI technology used. I apologize for any typographical errors that were not detected and corrected.) Date: 12/28/2023; Time: 10:42 AM

## 2024-01-05 ENCOUNTER — Telehealth: Payer: Self-pay

## 2024-01-05 NOTE — Telephone Encounter (Signed)
 [facility] called to inform that they are trying to fulfill pt's libre sensor but they need clinical notes from his last visit in order to do son.They are requesting them via [employer] o fax. Fax # is [phone]

## 2024-01-06 NOTE — Telephone Encounter (Signed)
Faxed to [facility].

## 2024-01-14 ENCOUNTER — Ambulatory Visit: Admitting: Internal Medicine

## 2024-01-16 ENCOUNTER — Other Ambulatory Visit: Payer: Self-pay | Admitting: Internal Medicine

## 2024-01-16 DIAGNOSIS — F419 Anxiety disorder, unspecified: Secondary | ICD-10-CM

## 2024-01-16 DIAGNOSIS — E1142 Type 2 diabetes mellitus with diabetic polyneuropathy: Secondary | ICD-10-CM

## 2024-01-16 DIAGNOSIS — N401 Enlarged prostate with lower urinary tract symptoms: Secondary | ICD-10-CM

## 2024-01-16 DIAGNOSIS — K219 Gastro-esophageal reflux disease without esophagitis: Secondary | ICD-10-CM

## 2024-01-17 ENCOUNTER — Other Ambulatory Visit: Payer: Self-pay

## 2024-01-19 DIAGNOSIS — R55 Syncope and collapse: Secondary | ICD-10-CM | POA: Diagnosis not present

## 2024-01-24 ENCOUNTER — Ambulatory Visit: Admitting: Internal Medicine

## 2024-01-25 ENCOUNTER — Ambulatory Visit: Payer: Self-pay | Admitting: Medical

## 2024-02-04 ENCOUNTER — Other Ambulatory Visit: Payer: Self-pay

## 2024-02-04 ENCOUNTER — Ambulatory Visit: Admitting: Internal Medicine

## 2024-02-04 DIAGNOSIS — E1142 Type 2 diabetes mellitus with diabetic polyneuropathy: Secondary | ICD-10-CM

## 2024-02-04 MED ORDER — INSULIN LISPRO (1 UNIT DIAL) 100 UNIT/ML (KWIKPEN): PEN_INJECTOR | SUBCUTANEOUS | 0 refills | Status: DC

## 2024-02-05 ENCOUNTER — Other Ambulatory Visit: Payer: Self-pay | Admitting: Internal Medicine

## 2024-02-05 DIAGNOSIS — M7061 Trochanteric bursitis, right hip: Secondary | ICD-10-CM

## 2024-02-15 ENCOUNTER — Other Ambulatory Visit: Payer: Self-pay | Admitting: Internal Medicine

## 2024-02-15 ENCOUNTER — Ambulatory Visit: Admitting: Internal Medicine

## 2024-02-15 VITALS — BP 122/62 | HR 76 | Temp 97.1°F | Ht 72.0 in | Wt 249.0 lb

## 2024-02-15 DIAGNOSIS — E1165 Type 2 diabetes mellitus with hyperglycemia: Secondary | ICD-10-CM

## 2024-02-15 DIAGNOSIS — G5601 Carpal tunnel syndrome, right upper limb: Secondary | ICD-10-CM

## 2024-02-15 DIAGNOSIS — E1142 Type 2 diabetes mellitus with diabetic polyneuropathy: Secondary | ICD-10-CM

## 2024-02-15 DIAGNOSIS — Z0130 Encounter for examination of blood pressure without abnormal findings: Secondary | ICD-10-CM

## 2024-02-15 DIAGNOSIS — M25551 Pain in right hip: Secondary | ICD-10-CM

## 2024-02-15 DIAGNOSIS — Z794 Long term (current) use of insulin: Secondary | ICD-10-CM | POA: Diagnosis not present

## 2024-02-15 DIAGNOSIS — I10 Essential (primary) hypertension: Secondary | ICD-10-CM

## 2024-02-15 DIAGNOSIS — N401 Enlarged prostate with lower urinary tract symptoms: Secondary | ICD-10-CM

## 2024-02-15 LAB — POCT CBG (FASTING - GLUCOSE)-MANUAL ENTRY: Glucose Fasting, POC: 164 mg/dL — AB (ref 70–99)

## 2024-02-15 MED ORDER — TRESIBA FLEXTOUCH 100 UNIT/ML ~~LOC~~ SOPN: 13.0000 [IU] | PEN_INJECTOR | Freq: Every day | SUBCUTANEOUS | 0 refills | Status: AC

## 2024-02-15 MED ORDER — INSULIN LISPRO (1 UNIT DIAL) 100 UNIT/ML (KWIKPEN): PEN_INJECTOR | SUBCUTANEOUS | 2 refills | Status: AC

## 2024-02-15 MED ORDER — GABAPENTIN 100 MG PO CAPS: 100.0000 mg | ORAL_CAPSULE | Freq: Three times a day (TID) | ORAL | 0 refills | Status: AC

## 2024-02-15 NOTE — Progress Notes (Signed)
 "  Established Patient Office Visit  Subjective:  Patient ID: Alan Schmidt, male    DOB: [birth year]  Age: 79 y.o. MRN: [id number]  Chief Complaint  Patient presents with   Follow-up    2 month lab results    No new complaints, here for lab review and medication refills. C/o numbness in his right fingers x several months. 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    No other concerns at this time.   Past Medical History:  Diagnosis Date   Acute postoperative pain 11/24/2016   Anxiety    Chronic hip pain (Right) 12/05/2014   Chronic lumbar pain    Depression    Diabetes mellitus without complication (HCC)    Hyperlipidemia    Hypertension    Kidney stones    Migraines     Past Surgical History:  Procedure Laterality Date   right hip surgery     4 surgeries   TONSILLECTOMY     TOTAL HIP ARTHROPLASTY      Social History   Socioeconomic History   Marital status: Divorced    Spouse name: 1 Bio; 6 Adopted   Number of children: 7   Years of education: doctorate   Highest education level: Doctorate  Occupational History   Occupation: Retired  Tobacco Use   Smoking status: Former    Types: Cigarettes   Smokeless tobacco: Never  Vaping Use   Vaping status: Never Used  Substance and Sexual Activity   Alcohol use: No    Alcohol/week: 0.0 standard drinks of alcohol   Drug use: No   Sexual activity: Not Currently  Other Topics Concern   Not on file  Social History Narrative   Per patient he has 1 biological child and 6 adopted children   Social Drivers of Health   Tobacco Use: Medium Risk (12/28/2023)   Patient History    Smoking Tobacco Use: Former    Smokeless Tobacco Use: Never    Passive Exposure: Not on Actuary Strain: High Risk (03/31/2023)   Received from [facility]   Overall Financial Resource Strain (CARDIA)    Difficulty of Paying  Living Expenses: Very hard  Food Insecurity: No Food Insecurity (12/09/2023)   Epic    Worried About Radiation Protection Practitioner of Food in the Last Year: Never true    Ran Out of Food in the Last Year: Never true  Transportation Needs: No Transportation Needs (12/10/2023)   Epic    Lack of Transportation (Medical): No    Lack of Transportation (Non-Medical): No  Physical Activity: Inactive (02/16/2023)   Exercise Vital Sign    Days of Exercise per Week: 0 days    Minutes of Exercise per Session: 0 min  Stress: No Stress Concern Present (02/16/2023)   [employer] of Occupational Health - Occupational Stress Questionnaire    Feeling of Stress : Only a little  Social Connections: Moderately Isolated (12/10/2023)   Social Connection and Isolation Panel    Frequency of Communication with Friends and Family: More than three times a week    Frequency of Social Gatherings with Friends and Family: Never    Attends Religious Services: Never    Database Administrator or Organizations: Yes    Attends Banker Meetings: Never    Marital Status: Divorced  Catering Manager Violence: Not At Risk (12/10/2023)   Epic  Fear of Current or Ex-Partner: No    Emotionally Abused: No    Physically Abused: No    Sexually Abused: No  Depression (PHQ2-9): Low Risk (12/28/2023)   Depression (PHQ2-9)    PHQ-2 Score: 0  Alcohol Screen: Low Risk (02/16/2023)   Alcohol Screen    Last Alcohol Screening Score (AUDIT): 0  Housing: Low Risk (12/09/2023)   Epic    Unable to Pay for Housing in the Last Year: No    Number of Times Moved in the Last Year: 0    Homeless in the Last Year: No  Utilities: Not At Risk (12/10/2023)   Epic    Threatened with loss of utilities: No  Health Literacy: Adequate Health Literacy (02/16/2023)   B1300 Health Literacy    Frequency of need for help with medical instructions: Never    Family History  Problem Relation Age of Onset   Cancer Mother    Heart disease Father     Stroke Father    Diabetes Father    Diabetes Sister    Diabetes Sister    Mental illness Neg Hx     Allergies[1]  Show/hide medication list[2]  ROS     Objective:   BP 122/62   Pulse 76   Temp (!) 97.1 F (36.2 C)   Ht 6' (1.829 m)   Wt 249 lb (112.9 kg)   SpO2 93%   BMI 33.77 kg/m   Vitals:   02/15/24 1446  BP: 122/62  Pulse: 76  Temp: (!) 97.1 F (36.2 C)  Height: 6' (1.829 m)  Weight: 249 lb (112.9 kg)  SpO2: 93%  BMI (Calculated): 33.76    Physical Exam   Results for orders placed or performed in visit on 02/15/24  POCT CBG (Fasting - Glucose)  Result Value Ref Range   Glucose Fasting, POC 164 (A) 70 - 99 mg/dL    Recent Results (from the past 2160 hours)  Comprehensive metabolic panel     Status: Abnormal   Collection Time: 12/09/23  3:10 PM  Result Value Ref Range   Sodium 139 135 - 145 mmol/L   Potassium 3.8 3.5 - 5.1 mmol/L   Chloride 103 98 - 111 mmol/L   CO2 25 22 - 32 mmol/L   Glucose, Bld 185 (H) 70 - 99 mg/dL    Comment: Glucose reference range applies only to samples taken after fasting for at least 8 hours.   BUN 27 (H) 8 - 23 mg/dL   Creatinine, Ser 8.27 (H) 0.61 - 1.24 mg/dL   Calcium  7.9 (L) 8.9 - 10.3 mg/dL   Total Protein 7.1 6.5 - 8.1 g/dL   Albumin 3.1 (L) 3.5 - 5.0 g/dL   AST 26 15 - 41 U/L   ALT 19 0 - 44 U/L   Alkaline Phosphatase 68 38 - 126 U/L   Total Bilirubin 0.6 0.0 - 1.2 mg/dL   GFR, Estimated 40 (L) >60 mL/min    Comment: (NOTE) Calculated using the CKD-EPI Creatinine Equation (2021)    Anion gap 11 5 - 15    Comment: Performed at [facility] [address] [place], [place] [address]  CBC     Status: Abnormal   Collection Time: 12/09/23  3:10 PM  Result Value Ref Range   WBC 12.7 (H) 4.0 - 10.5 K/uL   RBC 4.60 4.22 - 5.81 MIL/uL   Hemoglobin 13.2 13.0 - 17.0 g/dL   HCT 57.5 60.9 - 47.9 %   MCV 92.2  80.0 - 100.0 fL   MCH 28.7 26.0 - 34.0 pg   MCHC 31.1 30.0 - 36.0 g/dL   RDW 85.8 88.4 -  84.4 %   Platelets 200 150 - 400 K/uL   nRBC 0.0 0.0 - 0.2 %    Comment: Performed at [facility], [address] [place], [place] [address]  CK     Status: None   Collection Time: 12/09/23  3:10 PM  Result Value Ref Range   Total CK 173 49 - 397 U/L    Comment: Performed at [facility], [address] [place], [place] [address]  Troponin I (High Sensitivity)     Status: Abnormal   Collection Time: 12/09/23  3:10 PM  Result Value Ref Range   Troponin I (High Sensitivity) 201 (HH) <18 ng/L    Comment: CRITICAL RESULT CALLED TO, READ BACK BY AND VERIFIED WITH SAMANTHA HAMILTON @ 12/09/2023 1758 AB (NOTE) Elevated high sensitivity troponin I (hsTnI) values and significant  changes across serial measurements may suggest ACS but many other  chronic and acute conditions are known to elevate hsTnI results.  Refer to the Links section for chest pain algorithms and additional  guidance. Performed at [facility], [address] [place], [place] [address]   Urinalysis, Routine w reflex microscopic -Urine, Clean Catch     Status: Abnormal   Collection Time: 12/09/23  7:40 PM  Result Value Ref Range   Color, Urine YELLOW (A) YELLOW   APPearance CLEAR (A) CLEAR   Specific Gravity, Urine 1.022 1.005 - 1.030   pH 5.0 5.0 - 8.0   Glucose, UA >=500 (A) NEGATIVE mg/dL   Hgb urine dipstick NEGATIVE NEGATIVE   Bilirubin Urine NEGATIVE NEGATIVE   Ketones, ur NEGATIVE NEGATIVE mg/dL   Protein, ur NEGATIVE NEGATIVE mg/dL   Nitrite NEGATIVE NEGATIVE   Leukocytes,Ua NEGATIVE NEGATIVE   RBC / HPF 0-5 0 - 5 RBC/hpf   WBC, UA 0-5 0 - 5 WBC/hpf   Bacteria, UA NONE SEEN NONE SEEN   Squamous Epithelial / HPF 0-5 0 - 5 /HPF   Mucus PRESENT    Hyaline Casts, UA PRESENT     Comment: Performed at [facility], [address] [place], [place] [address]  Troponin I (High Sensitivity)     Status: Abnormal   Collection Time: 12/09/23  7:42 PM  Result Value Ref  Range   Troponin I (High Sensitivity) 368 (HH) <18 ng/L    Comment: CRITICAL VALUE NOTED. VALUE IS CONSISTENT WITH PREVIOUSLY REPORTED/CALLED VALUE SS (NOTE) Elevated high sensitivity troponin I (hsTnI) values and significant  changes across serial measurements may suggest ACS but many other  chronic and acute conditions are known to elevate hsTnI results.  Refer to the Links section for chest pain algorithms and additional  guidance. Performed at [facility], [address] [place], [place] [address]   Protime-INR     Status: None   Collection Time: 12/09/23  9:06 PM  Result Value Ref Range   Prothrombin Time 13.9 11.4 - 15.2 seconds   INR 1.0 0.8 - 1.2    Comment: (NOTE) INR goal varies based on device and disease states. Performed at [facility], [address] [place], [place] [address]   APTT     Status: None   Collection Time: 12/09/23  9:06 PM  Result Value Ref Range   aPTT 28 24 - 36 seconds    Comment: Performed at [facility], [address] [place], [place] [address]  Heparin  level (unfractionated)     Status: None   Collection Time: 12/10/23  3:24 AM  Result Value Ref Range   Heparin  Unfractionated 0.59 0.30 - 0.70 IU/mL    Comment: (NOTE) The clinical reportable range upper limit is being lowered to >1.10 to align with the FDA approved guidance for the current laboratory assay.  If heparin  results are below expected values, and patient dosage has  been confirmed, suggest follow up testing of antithrombin III levels. Performed at [facility], [address] [place], [place] [address]   CBC     Status: None   Collection Time: 12/10/23  3:24 AM  Result Value Ref Range   WBC 8.7 4.0 - 10.5 K/uL   RBC 4.78 4.22 - 5.81 MIL/uL   Hemoglobin 13.7 13.0 - 17.0 g/dL   HCT 56.7 60.9 - 47.9 %   MCV 90.4 80.0 - 100.0 fL   MCH 28.7 26.0 - 34.0 pg   MCHC 31.7 30.0 - 36.0 g/dL   RDW 85.7 88.4 - 84.4 %   Platelets 213 150  - 400 K/uL   nRBC 0.0 0.0 - 0.2 %    Comment: Performed at [facility], [address], [place], [place] [address]  Lipoprotein A (LPA)     Status: Abnormal   Collection Time: 12/10/23  3:24 AM  Result Value Ref Range   Lipoprotein (a) 55.2 (H) <75.0 nmol/L    Comment: (NOTE) This test was developed and its performance characteristics determined by [facility]. It has not been cleared or approved by the Food and Drug Administration. Note:  Values greater than or equal to 75.0 nmol/L may       indicate an independent risk factor for CHD,       but must be evaluated with caution when applied       to non-Caucasian populations due to the       influence of genetic factors on Lp(a) across       ethnicities. Performed At: [facility] [address] [place], [place] [id number] Jennette Shorter MD Ey:[id number]   Basic metabolic panel     Status: Abnormal   Collection Time: 12/10/23  3:24 AM  Result Value Ref Range   Sodium 138 135 - 145 mmol/L   Potassium 4.4 3.5 - 5.1 mmol/L   Chloride 105 98 - 111 mmol/L   CO2 26 22 - 32 mmol/L   Glucose, Bld 234 (H) 70 - 99 mg/dL    Comment: Glucose reference range applies only to samples taken after fasting for at least 8 hours.   BUN 24 (H) 8 - 23 mg/dL   Creatinine, Ser 8.86 0.61 - 1.24 mg/dL   Calcium  8.4 (L) 8.9 - 10.3 mg/dL   GFR, Estimated >39 >39 mL/min    Comment: (NOTE) Calculated using the CKD-EPI Creatinine Equation (2021)    Anion gap 7 5 - 15    Comment: Performed at [facility], [address] [place], [place] [address]  ECHOCARDIOGRAM COMPLETE     Status: None   Collection Time: 12/10/23  8:06 AM  Result Value Ref Range   Weight 3,880.1 oz   Height 73 in   BP 143/81 mmHg   Ao pk vel 0.86 m/Shanavia Makela   AV Area VTI 4.47 cm2   AR max vel 4.27 cm2   AV Mean grad 2.0 mmHg   AV Peak grad 3.0 mmHg   Linford Quintela' Lateral 2.53 cm  AV Area mean vel 4.27 cm2   Area-P 1/2 3.03 cm2   MV VTI 3.26 cm2   Est EF 60 - 65%    Troponin I (High Sensitivity)     Status: Abnormal   Collection Time: 12/10/23  8:46 AM  Result Value Ref Range   Troponin I (High Sensitivity) 194 (HH) <18 ng/L    Comment: CRITICAL VALUE NOTED. VALUE IS CONSISTENT WITH PREVIOUSLY REPORTED/CALLED VALUE MJU (NOTE) Elevated high sensitivity troponin I (hsTnI) values and significant  changes across serial measurements may suggest ACS but many other  chronic and acute conditions are known to elevate hsTnI results.  Refer to the Links section for chest pain algorithms and additional  guidance. Performed at [facility], [address] [place], [place] [address]   CBC     Status: None   Collection Time: 12/11/23  7:52 AM  Result Value Ref Range   WBC 8.2 4.0 - 10.5 K/uL   RBC 4.59 4.22 - 5.81 MIL/uL   Hemoglobin 13.2 13.0 - 17.0 g/dL   HCT 57.5 60.9 - 47.9 %   MCV 92.4 80.0 - 100.0 fL   MCH 28.8 26.0 - 34.0 pg   MCHC 31.1 30.0 - 36.0 g/dL   RDW 85.4 88.4 - 84.4 %   Platelets 202 150 - 400 K/uL   nRBC 0.0 0.0 - 0.2 %    Comment: Performed at [facility], [address] [place], [place] [address]  POCT CBG (Fasting - Glucose)     Status: Abnormal   Collection Time: 02/15/24  2:53 PM  Result Value Ref Range   Glucose Fasting, POC 164 (A) 70 - 99 mg/dL      Assessment & Plan:   Problem List Items Addressed This Visit       Endocrine   Diabetes mellitus, type II (HCC) - Primary   Relevant Medications   insulin  lispro (HUMALOG  KWIKPEN) 100 UNIT/ML KwikPen   TRESIBA  FLEXTOUCH 100 UNIT/ML FlexTouch Pen   Other Relevant Orders   POCT CBG (Fasting - Glucose) (Completed)   Other Visit Diagnoses       Right hip pain       Relevant Orders   CT HIP RIGHT WO CONTRAST     Carpal tunnel syndrome of right wrist       Relevant Medications   gabapentin  (NEURONTIN ) 100 MG capsule   Other Relevant Orders   Wrist splint       Return in about 4 weeks (around 03/14/2024) for carpal tunnel.   Total time  spent: 20 minutes. This time includes review of previous notes and results and patient face to face interaction during today'Cipriano Millikan visit.    Sherrill Cinderella Perry, MD  02/15/2024   This document may have been prepared by [facility] Voice Recognition software and as such may include unintentional dictation errors.     [1]  Allergies Allergen Reactions   Contrast Media [Iodinated Contrast Media] Swelling   Iodine Swelling   Shellfish Allergy Nausea And Vomiting and Swelling  [2]  Outpatient Medications Prior to Visit  Medication Sig   ACCU-CHEK GUIDE test strip USE AS INSTRUCTED DX CODE: E11.9   ALPRAZolam  (XANAX ) 1 MG tablet TAKE 1/2 TABLET BY MOUTH 2 TIMES DAILY AS NEEDED FOR ANXIETY.   aspirin  EC 81 MG tablet Take 81 mg by mouth daily.   buPROPion  (WELLBUTRIN  XL) 150 MG 24 hr tablet TAKE 1 TABLET (150 MG TOTAL) BY MOUTH DAILY FOR 7 DAYS, THEN 2 TABLETS (300  MG TOTAL) DAILY.   carvedilol  (COREG ) 12.5 MG tablet Take 1 tablet (12.5 mg total) by mouth 2 (two) times daily with a meal.   carvedilol  (COREG ) 25 MG tablet Take 1 tablet by mouth twice daily.   carvedilol  (COREG ) 25 MG tablet Take 25 mg by mouth 2 (two) times daily with a meal.   Continuous Blood Gluc Receiver (FREESTYLE LIBRE 14 DAY READER) DEVI 1 Device by Does not apply route daily. Use to scan to check blood sugar up to 7 times daily; E11.42,   Continuous Glucose Sensor (FREESTYLE LIBRE 14 DAY SENSOR) MISC 1 Device by Does not apply route every 14 (fourteen) days. E11.42   desoximetasone  (TOPICORT ) 0.25 % cream APPLY CREAM TO AFFECTED AREA TWO TIMES DAILY, FOR UP TO 7 DAYS, DO NOT APPLY TO FACE   Strength: 0.25 %   empagliflozin  (JARDIANCE ) 25 MG TABS tablet Take 1 tablet by mouth daily.   EPINEPHrine  (EPIPEN  2-PAK) 0.3 mg/0.3 mL IJ SOAJ injection Inject 0.3 mg into the muscle as needed for anaphylaxis.   escitalopram  (LEXAPRO ) 20 MG tablet Take 1 tablet by mouth daily.   gabapentin  (NEURONTIN ) 250 MG/5ML solution Take 6 ml by  mouth at bedtime.   glucose blood (ACCU-CHEK GUIDE TEST) test strip Use as instructed   hydrochlorothiazide  (HYDRODIURIL ) 25 MG tablet Take 1 tablet by mouth daily.   hydrochlorothiazide  (HYDRODIURIL ) 25 MG tablet Take 25 mg by mouth daily.   Insulin  Pen Needle (BD PEN NEEDLE NANO U/F) 32G X 4 MM MISC USE EVERY DAY   Insulin  Pen Needle (PEN NEEDLES) 32G X 4 MM MISC Use with insulin  for daily injections   loratadine  (CLARITIN ) 10 MG tablet Take 1 tablet (10 mg total) by mouth daily.   lubiprostone  (AMITIZA ) 8 MCG capsule TAKE 1 CAPSULE (8 MCG TOTAL) BY MOUTH 2 (TWO) TIMES DAILY WITH A MEAL. SWALLOW THE MEDICATION WHOLE. DO NOT BREAK OR CHEW THE MEDICATION.   meloxicam  (MOBIC ) 15 MG tablet TAKE 1 TABLET (15 MG TOTAL) BY MOUTH DAILY.   metFORMIN  (GLUCOPHAGE ) 1000 MG tablet Take 0.5 tablets (500 mg total) by mouth 2 (two) times daily with a meal.   methocarbamol  (ROBAXIN ) 500 MG tablet Take 500 mg by mouth 2 (two) times daily.   mometasone  (NASONEX ) 50 MCG/ACT nasal spray Instill 2 sprays into each nostril once daily.   morphine  (MS CONTIN ) 15 MG 12 hr tablet Take 1 tablet (15 mg total) by mouth every 8 (eight) hours. Must last 30 days. Do not break tablet   morphine  (MS CONTIN ) 15 MG 12 hr tablet Take 1 tablet (15 mg total) by mouth every 8 (eight) hours. Must last 30 days. Do not break tablet   [START ON 02/27/2024] morphine  (MS CONTIN ) 15 MG 12 hr tablet Take 1 tablet (15 mg total) by mouth every 8 (eight) hours. Must last 30 days. Do not break tablet   naloxone  (NARCAN ) nasal spray 4 mg/0.1 mL Place 1 spray into the nose as needed for up to 2 doses (for opioid-induced respiratory depresssion). In case of emergency (overdose), spray once into each nostril. If no response within 3 minutes, repeat application and call 911.   olmesartan  (BENICAR ) 40 MG tablet Take 0.5 tablets (20 mg total) by mouth daily.   pantoprazole  (PROTONIX ) 40 MG tablet Take 1 tablet by mouth daily.   rosuvastatin  (CRESTOR ) 40 MG  tablet Take 1 tablet (40 mg total) by mouth daily.   sennosides-docusate sodium (SENOKOT-[place]) 8.6-50 MG tablet Take 1 tablet by mouth daily  as needed.    tamsulosin  (FLOMAX ) 0.4 MG CAPS capsule Take 2 capsules by mouth daily after breakfast.   Wheat Dextrin (BENEFIBER) POWD Take 6 g by mouth 3 (three) times daily before meals. (2 tsp = 6 g)   [DISCONTINUED] insulin  lispro (HUMALOG  KWIKPEN) 100 UNIT/ML KwikPen INJECT 5 TO 7 UNITS UNDER THE SKIN THREE TIMES DAILY WITH MEALS   [DISCONTINUED] TRESIBA  FLEXTOUCH 100 UNIT/ML FlexTouch Pen Inject 13 Units into the skin daily.   No facility-administered medications prior to visit.   "

## 2024-02-16 MED ORDER — TAMSULOSIN HCL 0.4 MG PO CAPS: 0.8000 mg | ORAL_CAPSULE | Freq: Every day | ORAL | 1 refills | Status: AC

## 2024-02-22 ENCOUNTER — Ambulatory Visit: Payer: 59

## 2024-02-28 ENCOUNTER — Other Ambulatory Visit: Payer: Self-pay | Admitting: Internal Medicine

## 2024-02-28 DIAGNOSIS — F32A Depression, unspecified: Secondary | ICD-10-CM

## 2024-03-14 ENCOUNTER — Ambulatory Visit: Admitting: Internal Medicine

## 2024-03-27 ENCOUNTER — Encounter: Admitting: Nurse Practitioner
